# Patient Record
Sex: Female | Born: 1950
Health system: Southern US, Community
[De-identification: ages and names within clinical notes are randomized; demographics above are authoritative.]

## PROBLEM LIST (undated history)

## (undated) DIAGNOSIS — I8393 Asymptomatic varicose veins of bilateral lower extremities: Secondary | ICD-10-CM

## (undated) DIAGNOSIS — Z803 Family history of malignant neoplasm of breast: Secondary | ICD-10-CM

## (undated) DIAGNOSIS — E785 Hyperlipidemia, unspecified: Secondary | ICD-10-CM

## (undated) DIAGNOSIS — M1990 Unspecified osteoarthritis, unspecified site: Secondary | ICD-10-CM

## (undated) DIAGNOSIS — N200 Calculus of kidney: Secondary | ICD-10-CM

## (undated) DIAGNOSIS — H04123 Dry eye syndrome of bilateral lacrimal glands: Secondary | ICD-10-CM

## (undated) DIAGNOSIS — Z8042 Family history of malignant neoplasm of prostate: Secondary | ICD-10-CM

## (undated) DIAGNOSIS — Z9889 Other specified postprocedural states: Secondary | ICD-10-CM

## (undated) DIAGNOSIS — R112 Nausea with vomiting, unspecified: Secondary | ICD-10-CM

## (undated) DIAGNOSIS — M8580 Other specified disorders of bone density and structure, unspecified site: Secondary | ICD-10-CM

## (undated) DIAGNOSIS — Z87442 Personal history of urinary calculi: Secondary | ICD-10-CM

## (undated) DIAGNOSIS — IMO0002 Reserved for concepts with insufficient information to code with codable children: Secondary | ICD-10-CM

## (undated) DIAGNOSIS — N390 Urinary tract infection, site not specified: Secondary | ICD-10-CM

## (undated) DIAGNOSIS — C4360 Malignant melanoma of unspecified upper limb, including shoulder: Secondary | ICD-10-CM

## (undated) DIAGNOSIS — E569 Vitamin deficiency, unspecified: Secondary | ICD-10-CM

## (undated) DIAGNOSIS — F419 Anxiety disorder, unspecified: Secondary | ICD-10-CM

## (undated) DIAGNOSIS — I10 Essential (primary) hypertension: Secondary | ICD-10-CM

## (undated) DIAGNOSIS — D649 Anemia, unspecified: Secondary | ICD-10-CM

## (undated) DIAGNOSIS — D219 Benign neoplasm of connective and other soft tissue, unspecified: Secondary | ICD-10-CM

## (undated) DIAGNOSIS — Z8049 Family history of malignant neoplasm of other genital organs: Secondary | ICD-10-CM

## (undated) DIAGNOSIS — Z800 Family history of malignant neoplasm of digestive organs: Secondary | ICD-10-CM

## (undated) DIAGNOSIS — E66811 Obesity, class 1: Secondary | ICD-10-CM

## (undated) DIAGNOSIS — C50912 Malignant neoplasm of unspecified site of left female breast: Secondary | ICD-10-CM

## (undated) DIAGNOSIS — E669 Obesity, unspecified: Secondary | ICD-10-CM

## (undated) HISTORY — PX: CHOLECYSTECTOMY: SHX55

## (undated) HISTORY — DX: Family history of malignant neoplasm of digestive organs: Z80.0

## (undated) HISTORY — DX: Vitamin deficiency, unspecified: E56.9

## (undated) HISTORY — PX: SKIN CANCER EXCISION: SHX779

## (undated) HISTORY — PX: HYSTEROSCOPY: SHX211

## (undated) HISTORY — PX: SKIN BIOPSY: SHX1

## (undated) HISTORY — DX: Malignant melanoma of unspecified upper limb, including shoulder: C43.60

## (undated) HISTORY — DX: Family history of malignant neoplasm of prostate: Z80.42

## (undated) HISTORY — DX: Family history of malignant neoplasm of other genital organs: Z80.49

## (undated) HISTORY — PX: BREAST SURGERY: SHX581

## (undated) HISTORY — DX: Unspecified osteoarthritis, unspecified site: M19.90

## (undated) HISTORY — DX: Hyperlipidemia, unspecified: E78.5

## (undated) HISTORY — PX: DERMOID CYST  EXCISION: SHX1452

## (undated) HISTORY — DX: Other specified disorders of bone density and structure, unspecified site: M85.80

## (undated) HISTORY — DX: Calculus of kidney: N20.0

## (undated) HISTORY — DX: Benign neoplasm of connective and other soft tissue, unspecified: D21.9

## (undated) HISTORY — DX: Essential (primary) hypertension: I10

## (undated) HISTORY — PX: APPENDECTOMY: SHX54

## (undated) HISTORY — DX: Family history of malignant neoplasm of breast: Z80.3

## (undated) HISTORY — DX: Urinary tract infection, site not specified: N39.0

## (undated) HISTORY — PX: GALLBLADDER SURGERY: SHX652

---

## 1973-02-28 HISTORY — PX: ABDOMINAL SURGERY: SHX537

## 1996-02-29 HISTORY — PX: DILATION AND CURETTAGE OF UTERUS: SHX78

## 1998-07-10 ENCOUNTER — Other Ambulatory Visit: Admission: RE | Admit: 1998-07-10 | Discharge: 1998-07-10 | Payer: Self-pay | Admitting: Gynecology

## 1999-07-12 ENCOUNTER — Other Ambulatory Visit: Admission: RE | Admit: 1999-07-12 | Discharge: 1999-07-12 | Payer: Self-pay | Admitting: Gynecology

## 1999-08-18 ENCOUNTER — Encounter: Admission: RE | Admit: 1999-08-18 | Discharge: 1999-08-18 | Payer: Self-pay | Admitting: Surgery

## 1999-08-18 ENCOUNTER — Encounter: Payer: Self-pay | Admitting: Surgery

## 1999-08-24 ENCOUNTER — Other Ambulatory Visit: Admission: RE | Admit: 1999-08-24 | Discharge: 1999-08-24 | Payer: Self-pay | Admitting: Surgery

## 1999-08-24 ENCOUNTER — Encounter (INDEPENDENT_AMBULATORY_CARE_PROVIDER_SITE_OTHER): Payer: Self-pay

## 2000-07-12 ENCOUNTER — Other Ambulatory Visit: Admission: RE | Admit: 2000-07-12 | Discharge: 2000-07-12 | Payer: Self-pay | Admitting: Gynecology

## 2001-08-07 ENCOUNTER — Other Ambulatory Visit: Admission: RE | Admit: 2001-08-07 | Discharge: 2001-08-07 | Payer: Self-pay | Admitting: Gynecology

## 2002-08-19 ENCOUNTER — Other Ambulatory Visit: Admission: RE | Admit: 2002-08-19 | Discharge: 2002-08-19 | Payer: Self-pay | Admitting: Gynecology

## 2003-08-22 ENCOUNTER — Other Ambulatory Visit: Admission: RE | Admit: 2003-08-22 | Discharge: 2003-08-22 | Payer: Self-pay | Admitting: Gynecology

## 2004-08-26 ENCOUNTER — Other Ambulatory Visit: Admission: RE | Admit: 2004-08-26 | Discharge: 2004-08-26 | Payer: Self-pay | Admitting: Gynecology

## 2007-03-01 DIAGNOSIS — C4360 Malignant melanoma of unspecified upper limb, including shoulder: Secondary | ICD-10-CM

## 2007-03-01 HISTORY — DX: Malignant melanoma of unspecified upper limb, including shoulder: C43.60

## 2007-06-21 ENCOUNTER — Other Ambulatory Visit: Admission: RE | Admit: 2007-06-21 | Discharge: 2007-06-21 | Payer: Self-pay | Admitting: Gynecology

## 2008-07-29 DIAGNOSIS — E569 Vitamin deficiency, unspecified: Secondary | ICD-10-CM

## 2008-07-29 HISTORY — DX: Vitamin deficiency, unspecified: E56.9

## 2008-08-14 ENCOUNTER — Ambulatory Visit: Payer: Self-pay | Admitting: Gynecology

## 2008-08-14 ENCOUNTER — Encounter: Payer: Self-pay | Admitting: Gynecology

## 2008-08-14 ENCOUNTER — Other Ambulatory Visit: Admission: RE | Admit: 2008-08-14 | Discharge: 2008-08-14 | Payer: Self-pay | Admitting: Gynecology

## 2008-08-27 ENCOUNTER — Ambulatory Visit: Payer: Self-pay | Admitting: Gynecology

## 2008-09-03 ENCOUNTER — Ambulatory Visit: Payer: Self-pay | Admitting: Gynecology

## 2009-07-17 ENCOUNTER — Ambulatory Visit: Payer: Self-pay | Admitting: Gynecology

## 2009-08-18 ENCOUNTER — Other Ambulatory Visit: Admission: RE | Admit: 2009-08-18 | Discharge: 2009-08-18 | Payer: Self-pay | Admitting: Gynecology

## 2009-08-18 ENCOUNTER — Ambulatory Visit: Payer: Self-pay | Admitting: Gynecology

## 2009-09-22 ENCOUNTER — Ambulatory Visit: Payer: Self-pay | Admitting: Gynecology

## 2010-05-21 ENCOUNTER — Other Ambulatory Visit: Payer: Self-pay | Admitting: Dermatology

## 2010-08-20 ENCOUNTER — Other Ambulatory Visit (HOSPITAL_COMMUNITY)
Admission: RE | Admit: 2010-08-20 | Discharge: 2010-08-20 | Disposition: A | Discharge status: Home / Self Care | Payer: BC Managed Care – PPO | Source: Ambulatory Visit | Attending: Gynecology | Admitting: Gynecology

## 2010-08-20 ENCOUNTER — Encounter (INDEPENDENT_AMBULATORY_CARE_PROVIDER_SITE_OTHER): Payer: BC Managed Care – PPO | Admitting: Gynecology

## 2010-08-20 ENCOUNTER — Other Ambulatory Visit: Payer: Self-pay | Admitting: Gynecology

## 2010-08-20 DIAGNOSIS — R82998 Other abnormal findings in urine: Secondary | ICD-10-CM

## 2010-08-20 DIAGNOSIS — N952 Postmenopausal atrophic vaginitis: Secondary | ICD-10-CM

## 2010-08-20 DIAGNOSIS — Z833 Family history of diabetes mellitus: Secondary | ICD-10-CM

## 2010-08-20 DIAGNOSIS — Z124 Encounter for screening for malignant neoplasm of cervix: Secondary | ICD-10-CM | POA: Insufficient documentation

## 2010-08-20 DIAGNOSIS — M899 Disorder of bone, unspecified: Secondary | ICD-10-CM

## 2010-08-20 DIAGNOSIS — Z13220 Encounter for screening for lipoid disorders: Secondary | ICD-10-CM

## 2010-08-20 DIAGNOSIS — Z01419 Encounter for gynecological examination (general) (routine) without abnormal findings: Secondary | ICD-10-CM

## 2010-11-26 ENCOUNTER — Other Ambulatory Visit: Payer: Self-pay | Admitting: Dermatology

## 2010-12-17 ENCOUNTER — Ambulatory Visit (INDEPENDENT_AMBULATORY_CARE_PROVIDER_SITE_OTHER): Payer: BC Managed Care – PPO | Admitting: Women's Health

## 2010-12-17 ENCOUNTER — Encounter: Payer: Self-pay | Admitting: Women's Health

## 2010-12-17 VITALS — BP 150/84 | Temp 97.4°F

## 2010-12-17 DIAGNOSIS — B373 Candidiasis of vulva and vagina: Secondary | ICD-10-CM

## 2010-12-17 DIAGNOSIS — N949 Unspecified condition associated with female genital organs and menstrual cycle: Secondary | ICD-10-CM

## 2010-12-17 DIAGNOSIS — B3731 Acute candidiasis of vulva and vagina: Secondary | ICD-10-CM

## 2010-12-17 DIAGNOSIS — N9489 Other specified conditions associated with female genital organs and menstrual cycle: Secondary | ICD-10-CM

## 2010-12-17 DIAGNOSIS — N390 Urinary tract infection, site not specified: Secondary | ICD-10-CM

## 2010-12-17 DIAGNOSIS — R82998 Other abnormal findings in urine: Secondary | ICD-10-CM

## 2010-12-17 MED ORDER — SULFAMETHOXAZOLE-TRIMETHOPRIM 800-160 MG PO TABS: 1.0000 | ORAL_TABLET | Freq: Two times a day (BID) | ORAL | Status: AC

## 2010-12-17 MED ORDER — FLUCONAZOLE 150 MG PO TABS: 150.0000 mg | ORAL_TABLET | Freq: Once | ORAL | Status: AC

## 2010-12-17 NOTE — Progress Notes (Signed)
  Presents with a complaint of pain, burning, increased frequency and urgency with urination and a slight discharge. Postmenopausal with no bleeding and not sexually active.  Exam: No CVAT, external genitalia is slightly erythematous, speculum exam scant discharge.  Wet prep is positive for yeast. UA 15-20 WBCs +2 bacteria.  Plan: Septra DS one by mouth twice a day for 3 days prescription proper use was given and reviewed, Diflucan 150 by mouth x1 dose with a refill. Will check a urine culture. UTI prevention discussed, will call if no relief.

## 2011-04-03 ENCOUNTER — Emergency Department (HOSPITAL_COMMUNITY)
Admission: EM | Admit: 2011-04-03 | Discharge: 2011-04-03 | Disposition: A | Discharge status: Home / Self Care | Payer: BC Managed Care – PPO | Attending: Emergency Medicine | Admitting: Emergency Medicine

## 2011-04-03 ENCOUNTER — Encounter (HOSPITAL_COMMUNITY): Payer: Self-pay | Admitting: *Deleted

## 2011-04-03 DIAGNOSIS — R300 Dysuria: Secondary | ICD-10-CM | POA: Insufficient documentation

## 2011-04-03 DIAGNOSIS — N390 Urinary tract infection, site not specified: Secondary | ICD-10-CM | POA: Insufficient documentation

## 2011-04-03 DIAGNOSIS — R319 Hematuria, unspecified: Secondary | ICD-10-CM | POA: Insufficient documentation

## 2011-04-03 HISTORY — DX: Dry eye syndrome of bilateral lacrimal glands: H04.123

## 2011-04-03 LAB — URINE MICROSCOPIC-ADD ON

## 2011-04-03 LAB — URINALYSIS, ROUTINE W REFLEX MICROSCOPIC
Nitrite: POSITIVE — AB
Protein, ur: 100 mg/dL — AB
Specific Gravity, Urine: 1.027 (ref 1.005–1.030)
Urobilinogen, UA: 0.2 mg/dL (ref 0.0–1.0)

## 2011-04-03 MED ORDER — CIPROFLOXACIN HCL 500 MG PO TABS: 500.0000 mg | ORAL_TABLET | Freq: Two times a day (BID) | ORAL | Status: AC

## 2011-04-03 MED ORDER — PHENAZOPYRIDINE HCL 200 MG PO TABS: 200.0000 mg | ORAL_TABLET | Freq: Three times a day (TID) | ORAL | Status: AC

## 2011-04-03 MED ORDER — PHENAZOPYRIDINE HCL 200 MG PO TABS
200.0000 mg | ORAL_TABLET | Freq: Once | ORAL | Status: AC
  Administered 2011-04-03: 200 mg via ORAL
  Filled 2011-04-03: qty 1

## 2011-04-03 MED ORDER — CIPROFLOXACIN HCL 500 MG PO TABS
500.0000 mg | ORAL_TABLET | Freq: Once | ORAL | Status: AC
  Administered 2011-04-03: 500 mg via ORAL
  Filled 2011-04-03: qty 1

## 2011-04-03 NOTE — ED Provider Notes (Signed)
History     CSN: [id number]  Arrival date & time 04/03/11  0700   First MD Initiated Contact with Patient 04/03/11 0710      Chief Complaint  Patient presents with  . Hematuria  . Dysuria    (Consider location/radiation/quality/duration/timing/severity/associated sxs/prior treatment) Patient is a 61 y.o. female presenting with hematuria and dysuria. The history is provided by the patient.  Hematuria Associated symptoms include dysuria. Pertinent negatives include no chills, fever, nausea or vomiting.  Dysuria  Associated symptoms include hematuria. Pertinent negatives include no chills, no nausea and no vomiting.  pt c/o dysuria for past day and hematuria this am. Mild suprapubic discomfort, dull, non radiating. No back or flank pain. No nv. No fever or chills. States had uncomplicated uti 10/12. No nv. Normal appetite. No vaginal bleeding. No other abn bruising or bleeding. No anticoag use.   Past Medical History  Diagnosis Date  . Melanoma of upper arm 2009    RIGHT ARM   . Osteopenia 07/2000    DEXA  . Vitamin deficiency 07/2008    VITAMIN D LOW 23  . Vitamin d deficiency 07/2009    LOW VITAMIN D 29  . Bilateral dry eyes   . Cancer     Past Surgical History  Procedure Date  . Dermoid cyst  excision   . Hysteroscopy   . Cholpys   . Cholecysct   . Cholecystectomy   . Skin cancer excision 2010,2011    2010-RIGHT ARM, 2011-LEFT LOWER LEG.-DR Venancio Poisson DERM. DR. Irene Limbo IS HER DERM SURGEON.    Family History  Problem Relation Age of Onset  . Diabetes Father   . Hypertension Father   . Heart disease Father   . Prostate cancer Father   . Breast cancer Sister   . Skin cancer Sister   . Colon cancer Paternal Grandfather     History  Substance Use Topics  . Smoking status: Never Smoker   . Smokeless tobacco: Never Used  . Alcohol Use: No    OB History    Grav Para Term Preterm Abortions TAB SAB Ect Mult Living                  Review of Systems    Constitutional: Negative for fever and chills.  Gastrointestinal: Negative for nausea, vomiting and diarrhea.  Genitourinary: Positive for dysuria and hematuria.  Musculoskeletal: Negative for back pain.    Allergies  Neosporin wound cleanser  Home Medications  No current outpatient prescriptions on file.  BP 156/86  Pulse 93  Temp(Src) 98 F (36.7 C) (Oral)  Resp 18  Ht 5\' 9"  (1.753 m)  Wt 230 lb (104.327 kg)  BMI 33.96 kg/m2  SpO2 100%  LMP 03/18/1997  Physical Exam  Nursing note and vitals reviewed. Constitutional: She appears well-developed and well-nourished. No distress.  Eyes: Conjunctivae are normal. No scleral icterus.  Neck: Neck supple. No tracheal deviation present.  Cardiovascular: Normal rate.   Pulmonary/Chest: Effort normal. No respiratory distress.  Abdominal: Soft. Normal appearance and bowel sounds are normal. She exhibits no distension and no mass. There is no tenderness.  Genitourinary:       No cva tenderness  Musculoskeletal: She exhibits no edema.  Neurological: She is alert.  Skin: Skin is warm and dry. No rash noted.  Psychiatric: She has a normal mood and affect.    ED Course  Procedures (including critical care time)   Labs Reviewed  URINALYSIS, ROUTINE W REFLEX MICROSCOPIC  Results for orders placed during the hospital encounter of 04/03/11  URINALYSIS, ROUTINE W REFLEX MICROSCOPIC      Component Value Range   Color, Urine BROWN (*) YELLOW    APPearance TURBID (*) CLEAR    Specific Gravity, Urine 1.027  1.005 - 1.030    pH 5.5  5.0 - 8.0    Glucose, UA NEGATIVE  NEGATIVE (mg/dL)   Hgb urine dipstick LARGE (*) NEGATIVE    Bilirubin Urine SMALL (*) NEGATIVE    Ketones, ur TRACE (*) NEGATIVE (mg/dL)   Protein, ur 409 (*) NEGATIVE (mg/dL)   Urobilinogen, UA 0.2  0.0 - 1.0 (mg/dL)   Nitrite POSITIVE (*) NEGATIVE    Leukocytes, UA LARGE (*) NEGATIVE   URINE MICROSCOPIC-ADD ON      Component Value Range   Squamous Epithelial /  LPF RARE  RARE    WBC, UA TOO NUMEROUS TO COUNT  <3 (WBC/hpf)   RBC / HPF TOO NUMEROUS TO COUNT  <3 (RBC/hpf)   Bacteria, UA FEW (*) RARE    Urine-Other MICROSCOPIC EXAM PERFORMED ON UNCONCENTRATED URINE        MDM  ua sent.  ua pos.  Confirmed nkda (x neosporin). cipro and pyridium.         Suzi Roots, MD 04/03/11 [phone]

## 2011-04-03 NOTE — ED Notes (Signed)
Pt from home c/o hematuria as well as pressure in the urethral area and left groin area that started yesterday.

## 2011-04-03 NOTE — ED Notes (Signed)
Pt c/o hematuria starting at 0300. Pt c/o dysuria since yesterday.

## 2011-04-03 NOTE — ED Notes (Signed)
Pt given discharge instructions, amb with steady gait to discharge window 

## 2011-04-03 NOTE — ED Notes (Signed)
MD at bedside. 

## 2011-08-22 ENCOUNTER — Ambulatory Visit (INDEPENDENT_AMBULATORY_CARE_PROVIDER_SITE_OTHER): Payer: BC Managed Care – PPO | Admitting: Gynecology

## 2011-08-22 ENCOUNTER — Encounter: Payer: Self-pay | Admitting: Gynecology

## 2011-08-22 VITALS — BP 134/84 | Ht 68.5 in | Wt 240.0 lb

## 2011-08-22 DIAGNOSIS — Z01419 Encounter for gynecological examination (general) (routine) without abnormal findings: Secondary | ICD-10-CM

## 2011-08-22 DIAGNOSIS — Z131 Encounter for screening for diabetes mellitus: Secondary | ICD-10-CM

## 2011-08-22 DIAGNOSIS — Z13220 Encounter for screening for lipoid disorders: Secondary | ICD-10-CM

## 2011-08-22 DIAGNOSIS — D219 Benign neoplasm of connective and other soft tissue, unspecified: Secondary | ICD-10-CM | POA: Insufficient documentation

## 2011-08-22 DIAGNOSIS — M8580 Other specified disorders of bone density and structure, unspecified site: Secondary | ICD-10-CM

## 2011-08-22 DIAGNOSIS — M899 Disorder of bone, unspecified: Secondary | ICD-10-CM

## 2011-08-22 DIAGNOSIS — E559 Vitamin D deficiency, unspecified: Secondary | ICD-10-CM

## 2011-08-22 LAB — CBC WITH DIFFERENTIAL/PLATELET
Basophils Relative: 0 % (ref 0–1)
Eosinophils Absolute: 0.1 10*3/uL (ref 0.0–0.7)
Hemoglobin: 14.5 g/dL (ref 12.0–15.0)
MCH: 29 pg (ref 26.0–34.0)
MCHC: 34.3 g/dL (ref 30.0–36.0)
Monocytes Absolute: 0.6 10*3/uL (ref 0.1–1.0)
Monocytes Relative: 8 % (ref 3–12)
Neutrophils Relative %: 69 % (ref 43–77)

## 2011-08-22 LAB — LIPID PANEL
Total CHOL/HDL Ratio: 4.5 Ratio
VLDL: 43 mg/dL — ABNORMAL HIGH (ref 0–40)

## 2011-08-22 LAB — GLUCOSE, RANDOM: Glucose, Bld: 102 mg/dL — ABNORMAL HIGH (ref 70–99)

## 2011-08-22 LAB — TSH: TSH: 2.159 u[IU]/mL (ref 0.350–4.500)

## 2011-08-22 NOTE — Progress Notes (Signed)
Brooke [name] 09-30-1950 [id number]        61 y.o.  for annual exam.  Several issues noted below.  Past medical history,surgical history, medications, allergies, family history and social history were all reviewed and documented in the EPIC chart. ROS:  Was performed and pertinent positives and negatives are included in the history.  Exam: 1. [name] Asst. present Filed Vitals:   08/22/11 0938  BP: 134/84   General appearance  Normal Skin grossly normal Head/Neck normal with no cervical or supraclavicular adenopathy thyroid normal Lungs  clear Cardiac RR, without RMG Abdominal  soft, nontender, without masses, organomegaly or hernia Breasts  examined lying and sitting without masses, retractions, discharge or axillary adenopathy. Pelvic  Ext/BUS/vagina  normal   Cervix  normal   Uterus  anteverted, normal size, shape and contour, midline and mobile nontender   Adnexa  Without masses or tenderness    Anus and perineum  normal   Rectovaginal  normal sphincter tone without palpated masses or tenderness.    Assessment/Plan:  61 y.o. female for annual exam.     1. Mild menopausal symptoms with hot flashes/night sweats.  No bleeding history. She had been on HRT in the past. Options to reinitiate HRT versus trial of OTC soy reviewed. I again discussed the risks of HRT to include the WHI study of stroke, heart attack, DVT as well as increased risk of breast cancer. Patient wants to try OTC soy. She'll call me if she is not satisfied with this was progressed to HRT. The issues of transdermal/first pass effect also reviewed. 2. Mammography. Patient has not had a mammogram in several years. I discussed the incidence of breast cancer and the benefit of early detection. I strongly urged her to schedule this. SBE monthly reviewed. 3. Pap smear. Last Pap smear 2012. No Pap smear done today. She has numerous normal records in her chart and no history of abnormal Pap smears. We'll plan less frequent  screening. 4. Colonoscopy. Patients never had a colonoscopy. I reviewed the advantages of early detection and current screening guidelines and strongly recommended her to schedule this. 5. Osteopenia. Patient has mild osteopenia and a T score of -1.1. We'll schedule DEXA later this summer at a two-year interval. Is noted to be low on vitamin D we'll recheck vitamin D level today. Increase calcium vitamin D reviewed. 6. Health maintenance. She's never followed up with a primary physician as I had recommended as far as her cholesterol in the past. I again discussed the needs to address hypercholesterolemia and long-term consequences of cardiovascular disease. We'll recheck her lipid profile today. Otherwise I did recommend establishing care with a primary physician she acknowledges my recommendations.   Dara Lords MD, 10:18 AM 08/22/2011

## 2011-08-22 NOTE — Patient Instructions (Addendum)
Schedule mammography. Schedule colonoscopy. Follow up for bone density study as scheduled. Make appointment to see a primary physician in particular follow up of your cholesterol. Try over-the-counter soy. If you want to further discuss hormone replacement therapy call me. Otherwise follow up in one year for annual exam.

## 2011-08-23 ENCOUNTER — Telehealth: Payer: Self-pay | Admitting: Gynecology

## 2011-08-23 LAB — URINALYSIS W MICROSCOPIC + REFLEX CULTURE
Hgb urine dipstick: NEGATIVE
Nitrite: NEGATIVE
Protein, ur: 30 mg/dL — AB

## 2011-08-23 LAB — VITAMIN D 25 HYDROXY (VIT D DEFICIENCY, FRACTURES): Vit D, 25-Hydroxy: 24 ng/mL — ABNORMAL LOW (ref 30–89)

## 2011-08-23 NOTE — Telephone Encounter (Signed)
Tell patient  #1 glucose very mildly elevated at 102 which is considered prediabetic. #2 cholesterol 241 LDL 144 triglycerides 216 which are all elevated I do think that she needs to establish care with a primary physician to follow and consider treatment. She has run elevated in the past and has not followed up and this is really important to do so as I discussed with her at the office visit. #3 vitamin D.  Mildly low at 24 and I think she needs to start on a vitamin D supplement of 1000 units OTC daily.

## 2011-08-24 LAB — URINE CULTURE: Colony Count: NO GROWTH

## 2011-08-24 NOTE — Telephone Encounter (Signed)
Lm for pt to call

## 2011-09-07 NOTE — Telephone Encounter (Signed)
Left message for pt to call.

## 2011-09-08 NOTE — Telephone Encounter (Signed)
Pt informed with the below note. 

## 2011-10-06 ENCOUNTER — Ambulatory Visit (INDEPENDENT_AMBULATORY_CARE_PROVIDER_SITE_OTHER): Payer: BC Managed Care – PPO

## 2011-10-06 ENCOUNTER — Encounter: Payer: Self-pay | Admitting: Gynecology

## 2011-10-06 DIAGNOSIS — M949 Disorder of cartilage, unspecified: Secondary | ICD-10-CM

## 2011-10-06 DIAGNOSIS — M899 Disorder of bone, unspecified: Secondary | ICD-10-CM

## 2011-10-06 DIAGNOSIS — M8580 Other specified disorders of bone density and structure, unspecified site: Secondary | ICD-10-CM

## 2011-11-14 ENCOUNTER — Ambulatory Visit: Payer: BC Managed Care – PPO | Admitting: Family Medicine

## 2011-11-22 ENCOUNTER — Ambulatory Visit: Payer: BC Managed Care – PPO | Admitting: Family Medicine

## 2011-11-24 ENCOUNTER — Encounter: Payer: Self-pay | Admitting: Family Medicine

## 2011-11-24 ENCOUNTER — Ambulatory Visit (INDEPENDENT_AMBULATORY_CARE_PROVIDER_SITE_OTHER): Payer: BC Managed Care – PPO | Admitting: Family Medicine

## 2011-11-24 VITALS — BP 140/90 | Temp 98.6°F | Ht 69.0 in | Wt 239.0 lb

## 2011-11-24 DIAGNOSIS — E669 Obesity, unspecified: Secondary | ICD-10-CM

## 2011-11-24 DIAGNOSIS — M79673 Pain in unspecified foot: Secondary | ICD-10-CM

## 2011-11-24 DIAGNOSIS — Z23 Encounter for immunization: Secondary | ICD-10-CM

## 2011-11-24 DIAGNOSIS — Z0000 Encounter for general adult medical examination without abnormal findings: Secondary | ICD-10-CM

## 2011-11-24 DIAGNOSIS — E785 Hyperlipidemia, unspecified: Secondary | ICD-10-CM

## 2011-11-24 DIAGNOSIS — M79609 Pain in unspecified limb: Secondary | ICD-10-CM

## 2011-11-24 LAB — BASIC METABOLIC PANEL
BUN: 14 mg/dL (ref 6–23)
CO2: 28 mEq/L (ref 19–32)
Chloride: 105 mEq/L (ref 96–112)
Creatinine, Ser: 0.8 mg/dL (ref 0.4–1.2)

## 2011-11-24 LAB — LDL CHOLESTEROL, DIRECT: Direct LDL: 132.7 mg/dL

## 2011-11-24 LAB — LIPID PANEL
Cholesterol: 212 mg/dL — ABNORMAL HIGH (ref 0–200)
Triglycerides: 111 mg/dL (ref 0.0–149.0)

## 2011-11-24 LAB — HEMOGLOBIN A1C: Hgb A1c MFr Bld: 5.8 % (ref 4.6–6.5)

## 2011-11-24 NOTE — Patient Instructions (Signed)
-  We have ordered labs or studies at this visit. It usually takes 1-2 weeks for results and processing. We will contact you with instructions IF your results are abnormal. Normal results will be released to your [facility] in 1-2 weeks. If you have not heard from [place] or can not find your results in [facility] in 2 weeks please contact our office.  -We placed a referral for you as discussed. It usually takes about 1-2 weeks to process and schedule this referral. If you have not heard from [place] regarding this appointment in 2 weeks please contact our office.  -follow up yearly or sooner if we discover abnormal labs or if concerns

## 2011-11-24 NOTE — Progress Notes (Addendum)
Chief Complaint  Patient presents with  . Establish Care    HPI:  Here to establish care and wants basic screening labs. No other particular concerns today.  Did just finish course of prednisone yesterday.  Does not exercise, has foot issues and bunions - wants referral to podiatry.  Sees Dr. Colin Broach in gyn for yearly well women exams.  Followed by opthomology.  Followed closely by Dr. Venancio Poisson in dermatology for a hx of early melanoma.  Diet not great.  ROS: See pertinent positives and negatives per HPI.  Past Medical History  Diagnosis Date  . Melanoma of upper arm 2009    RIGHT ARM   . Osteopenia 09/2011    T score -1.5 FRAX  1% / 0.6%  . Vitamin deficiency 07/2008    VITAMIN D LOW 23  . Vitamin d deficiency 07/2009    LOW VITAMIN D 29  . Bilateral dry eyes   . Cancer   . Fibroid   . Arthritis   . UTI (urinary tract infection)   . Hyperlipidemia   . Kidney stones     Family History  Problem Relation Age of Onset  . Diabetes Father   . Hypertension Father   . Heart disease Father   . Prostate cancer Father   . Breast cancer Sister     Age 62's  . Skin cancer Sister   . Colon cancer Paternal Grandfather   . Hypertension Mother     History   Social History  . Marital Status: Single    Spouse Name: N/A    Number of Children: N/A  . Years of Education: N/A   Social History Main Topics  . Smoking status: Never Smoker   . Smokeless tobacco: Never Used  . Alcohol Use: Yes     rare  . Drug Use: No  . Sexually Active: No   Other Topics Concern  . None   Social History Narrative  . None    Current outpatient prescriptions:CycloSPORINE (RESTASIS OP), Apply to eye., Disp: , Rfl:   EXAM:  Filed Vitals:   11/24/11 0825  BP: 140/90  Temp: 98.6 F (37 C)    Body mass index is 35.29 kg/(m^2).  GENERAL: vitals reviewed and listed below, alert, oriented, appears well hydrated and in no acute distress  HEENT: atraumatic,  conjucntiva clear, no obvious abnormalities on inspection of external nose and ears  NECK: no masses on inspection  LUNGS: clear to auscultation bilaterally, no wheezes, rales or rhonchi, good air movement  CV: HRRR, no peripheral edema  MS: moves all extremities without noticeable abnormality  PSYCH: pleasant and cooperative, no obvious depression or anxiety  ASSESSMENT AND PLAN:  Discussed the following assessment and plan:  1. Visit for preventive health examination  Basic Metabolic Panel, Tdap vaccine greater than or equal to 7yo IM, Ambulatory referral to Gastroenterology, Hep C Antibody  2. Hyperlipidemia  Lipid Panel  3. Obesity  Hemoglobin A1c  4. Foot pain  Ambulatory referral to Podiatry   -preventive care reviewed and advised per orders - she will get flu at work -diet and exercise discussed at length -pt will schedule mammogram  Orders Placed This Encounter  Procedures  . Tdap vaccine greater than or equal to 7yo IM  . Lipid Panel  . Hemoglobin A1c  . Basic Metabolic Panel  . Hep C Antibody  . Ambulatory referral to Gastroenterology    Referral Priority:  Routine    Referral Type:  Consultation  Referral Reason:  Specialty Services Required    Requested Specialty:  Gastroenterology    Number of Visits Requested:  1  . Ambulatory referral to Podiatry    Referral Priority:  Routine    Referral Type:  Consultation    Referral Reason:  Specialty Services Required    Requested Specialty:  Podiatry    Number of Visits Requested:  1    Patient Instructions  -We have ordered labs or studies at this visit. It usually takes 1-2 weeks for results and processing. We will contact you with instructions IF your results are abnormal. Normal results will be released to your [facility] in 1-2 weeks. If you have not heard from [place] or can not find your results in [facility] in 2 weeks please contact our office.  -We placed a referral for you as discussed. It usually takes about  1-2 weeks to process and schedule this referral. If you have not heard from [place] regarding this appointment in 2 weeks please contact our office.  -follow up yearly or sooner if we discover abnormal labs or if concerns         Return to clinic immediately if symptoms worsen or persist or new concerns.  Return if symptoms worsen or fail to improve.  Kriste Basque R.

## 2011-11-24 NOTE — Addendum Note (Signed)
Addended by: Terressa Koyanagi on: 11/24/2011 12:36 PM   Modules accepted: Level of Service

## 2011-11-25 ENCOUNTER — Telehealth: Payer: Self-pay | Admitting: Family Medicine

## 2011-11-25 NOTE — Telephone Encounter (Signed)
Left a message for return call.  

## 2011-11-25 NOTE — Telephone Encounter (Signed)
Brooke [name],  Let Brooke [name] know:  Most of her labs looked good.   Her cholesterol and diabetes screening test indicate she may be at risk for developing diabetes and/or problems with her cholesterol. No medications are needed now, but we do recommend she do everything she can to eat healthy and stay active.  I would like for her to follow up in 4-5 months.   Thanks.

## 2011-11-28 NOTE — Telephone Encounter (Signed)
Left a message for pt to return call 

## 2011-11-30 NOTE — Telephone Encounter (Signed)
Attempted to call pt a few times.  A letter has been sent to pt's home address concerning labs.

## 2011-12-19 ENCOUNTER — Encounter: Payer: Self-pay | Admitting: Family Medicine

## 2011-12-19 DIAGNOSIS — E559 Vitamin D deficiency, unspecified: Secondary | ICD-10-CM | POA: Insufficient documentation

## 2011-12-19 DIAGNOSIS — M8580 Other specified disorders of bone density and structure, unspecified site: Secondary | ICD-10-CM

## 2011-12-19 DIAGNOSIS — E785 Hyperlipidemia, unspecified: Secondary | ICD-10-CM | POA: Insufficient documentation

## 2011-12-19 DIAGNOSIS — N951 Menopausal and female climacteric states: Secondary | ICD-10-CM

## 2011-12-19 NOTE — Progress Notes (Signed)
Some records received from Colin Broach, MD in gyn. Briefly reviewed. CPE 07/2011 Updated PMH, Problem list, health maintenance.

## 2012-01-19 ENCOUNTER — Other Ambulatory Visit: Payer: Self-pay | Admitting: Dermatology

## 2012-02-23 ENCOUNTER — Other Ambulatory Visit: Payer: Self-pay | Admitting: Dermatology

## 2012-07-25 ENCOUNTER — Other Ambulatory Visit: Payer: Self-pay | Admitting: Dermatology

## 2012-10-05 ENCOUNTER — Encounter: Payer: Self-pay | Admitting: Family Medicine

## 2012-10-05 ENCOUNTER — Ambulatory Visit (INDEPENDENT_AMBULATORY_CARE_PROVIDER_SITE_OTHER): Payer: BC Managed Care – PPO | Admitting: Family Medicine

## 2012-10-05 VITALS — BP 128/82 | Temp 98.9°F | Wt 244.0 lb

## 2012-10-05 DIAGNOSIS — R300 Dysuria: Secondary | ICD-10-CM

## 2012-10-05 DIAGNOSIS — R319 Hematuria, unspecified: Secondary | ICD-10-CM

## 2012-10-05 LAB — POCT URINALYSIS DIPSTICK
Bilirubin, UA: NEGATIVE
Glucose, UA: NEGATIVE
Nitrite, UA: NEGATIVE

## 2012-10-05 MED ORDER — CIPROFLOXACIN HCL 500 MG PO TABS: 500.0000 mg | ORAL_TABLET | Freq: Two times a day (BID) | ORAL | Status: DC

## 2012-10-05 NOTE — Progress Notes (Signed)
Chief Complaint  Patient presents with  . Hematuria    HPI:  Brooke [name] is here for an acute visit for Dysuria: -started: yesterday -symptoms: dysuria, burning with urination, frequency, urgency, gross hematuria (pink urine) this morning - now resolved -denies: fever, nausea, vomiting, flank pain -hx of: UTI and kidney stones (very remotely and this does not feel anything like kidney stones), has had several UTIs over the last few years  ROS: See pertinent positives and negatives per HPI.  Past Medical History  Diagnosis Date  . Melanoma of upper arm 2009    RIGHT ARM   . Osteopenia 09/2011    T score -1.5 FRAX  1% / 0.6%  . Vitamin deficiency 07/2008    VITAMIN D LOW 23  . Vitamin D deficiency 07/2009    LOW VITAMIN D 29  . Bilateral dry eyes   . Cancer   . Fibroid   . Arthritis   . UTI (urinary tract infection)   . Hyperlipidemia   . Kidney stones     Family History  Problem Relation Age of Onset  . Diabetes Father   . Hypertension Father   . Heart disease Father   . Prostate cancer Father   . Breast cancer Sister     Age 18's  . Skin cancer Sister   . Colon cancer Paternal Grandfather   . Hypertension Mother     History   Social History  . Marital Status: Single    Spouse Name: N/A    Number of Children: N/A  . Years of Education: N/A   Social History Main Topics  . Smoking status: Never Smoker   . Smokeless tobacco: Never Used  . Alcohol Use: Yes     Comment: rare  . Drug Use: No  . Sexually Active: No   Other Topics Concern  . None   Social History Narrative  . None    Current outpatient prescriptions:CycloSPORINE (RESTASIS OP), Apply to eye., Disp: , Rfl: ;  ciprofloxacin (CIPRO) 500 MG tablet, Take 1 tablet (500 mg total) by mouth 2 (two) times daily., Disp: 10 tablet, Rfl: 0  EXAM:  Filed Vitals:   10/05/12 1309  BP: 128/82  Temp: 98.9 F (37.2 C)    Body mass index is 36.02 kg/(m^2).  GENERAL: vitals reviewed and listed  above, alert, oriented, appears well hydrated and in no acute distress  HEENT: atraumatic, conjunttiva clear, no obvious abnormalities on inspection of external nose and ears  NECK: no obvious masses on inspection  LUNGS: clear to auscultation bilaterally, no wheezes, rales or rhonchi, good air movement  CV: HRRR, no peripheral edema  ABD: soft, NTTP, no CVA TTP  MS: moves all extremities without noticeable abnormality  PSYCH: pleasant and cooperative, no obvious depression or anxiety  ASSESSMENT AND PLAN:  Discussed the following assessment and plan:  Dysuria - Plan: Culture, Urine, ciprofloxacin (CIPRO) 500 MG tablet  Hematuria - Plan: POCT urinalysis dipstick, ciprofloxacin (CIPRO) 500 MG tablet  -signs and symptoms c/w with LUTI - urine dip abn w/ bld and pyuria, culture pending -discussed with pt and will tx empirically while awaiting culture - risks discussed -Patient advised to return or notify a doctor immediately if symptoms worsen or persist or new concerns arise.  Patient Instructions  -As we discussed, we have prescribed a new medication for you at this appointment. We discussed the common and serious potential adverse effects of this medication and you can review these and more with the pharmacist  when you pick up your medication.  Please follow the instructions for use carefully and notify us immediately if you have any problems taking this medication.  -We have ordered labs or studies at this visit. It can take up to 1-2 weeks for results and processing. We will contact you with instructions IF your results are abnormal. Normal results will be released to your [facility]. If you have not heard from [place] or can not find your results in [facility] in 2 weeks please contact our office.  -plenty of fluids and can use Azo for symptoms per instructions if needed     [name], Dahlia Client R.

## 2012-10-05 NOTE — Patient Instructions (Signed)
-  As we discussed, we have prescribed a new medication for you at this appointment. We discussed the common and serious potential adverse effects of this medication and you can review these and more with the pharmacist when you pick up your medication.  Please follow the instructions for use carefully and notify us immediately if you have any problems taking this medication.  -We have ordered labs or studies at this visit. It can take up to 1-2 weeks for results and processing. We will contact you with instructions IF your results are abnormal. Normal results will be released to your [facility]. If you have not heard from [place] or can not find your results in [facility] in 2 weeks please contact our office.  -plenty of fluids and can use Azo for symptoms per instructions if needed

## 2012-10-07 LAB — URINE CULTURE: Colony Count: 9000

## 2012-10-08 NOTE — Progress Notes (Signed)
Quick Note:  Left a message for pt to return call. ______ 

## 2012-10-09 NOTE — Progress Notes (Signed)
Quick Note:  Left a message for pt to return call. ______ 

## 2012-10-10 NOTE — Progress Notes (Signed)
Quick Note:  Left a message for return call. ______ 

## 2012-10-18 NOTE — Progress Notes (Signed)
Quick Note:  Left a message for pt to return call. ______ 

## 2012-11-05 ENCOUNTER — Encounter: Payer: Self-pay | Admitting: Family Medicine

## 2012-11-05 ENCOUNTER — Other Ambulatory Visit: Payer: Self-pay | Admitting: Family Medicine

## 2012-11-05 ENCOUNTER — Ambulatory Visit (INDEPENDENT_AMBULATORY_CARE_PROVIDER_SITE_OTHER): Payer: BC Managed Care – PPO | Admitting: Family Medicine

## 2012-11-05 VITALS — BP 142/82 | Temp 98.4°F | Wt 244.0 lb

## 2012-11-05 DIAGNOSIS — R319 Hematuria, unspecified: Secondary | ICD-10-CM

## 2012-11-05 LAB — URINALYSIS, ROUTINE W REFLEX MICROSCOPIC
Bilirubin Urine: NEGATIVE
Hgb urine dipstick: NEGATIVE
Leukocytes, UA: NEGATIVE
Nitrite: NEGATIVE
Urobilinogen, UA: 0.2 (ref 0.0–1.0)
pH: 7 (ref 5.0–8.0)

## 2012-11-05 NOTE — Progress Notes (Signed)
Chief Complaint  Patient presents with  . follow up UTI    HPI:  Brooke [name] is here for follow up UTI: -tx about 1 month ago for UTI when had symptoms, pyuria and hematuria - urine culture inconclusive, she did not complete the cipro because misplaced last few pills -reports: has felt ok, occ vulvovag pruritis, occ urinary urgency -denies: denies and further gross hematuria, frequency, pain, burning with urination, flank pain   ROS: See pertinent positives and negatives per HPI.  Past Medical History  Diagnosis Date  . Melanoma of upper arm 2009    RIGHT ARM   . Osteopenia 09/2011    T score -1.5 FRAX  1% / 0.6%  . Vitamin deficiency 07/2008    VITAMIN D LOW 23  . Vitamin D deficiency 07/2009    LOW VITAMIN D 29  . Bilateral dry eyes   . Cancer   . Fibroid   . Arthritis   . UTI (urinary tract infection)   . Hyperlipidemia   . Kidney stones     Past Surgical History  Procedure Laterality Date  . Dermoid cyst  excision    . Hysteroscopy    . Cholecystectomy    . Skin cancer excision  2010,2011    2010-RIGHT ARM, 2011-LEFT LOWER LEG.-DR Venancio Poisson DERM. DR. Irene Limbo IS HER DERM SURGEON.  . Skin biopsy    . Dilation and curettage of uterus  1998  . Gallbladder surgery    . Abdominal surgery  1975    ovairan cyst     Family History  Problem Relation Age of Onset  . Diabetes Father   . Hypertension Father   . Heart disease Father   . Prostate cancer Father   . Breast cancer Sister     Age 52's  . Skin cancer Sister   . Colon cancer Paternal Grandfather   . Hypertension Mother     History   Social History  . Marital Status: Single    Spouse Name: N/A    Number of Children: N/A  . Years of Education: N/A   Social History Main Topics  . Smoking status: Never Smoker   . Smokeless tobacco: Never Used  . Alcohol Use: Yes     Comment: rare  . Drug Use: No  . Sexual Activity: No   Other Topics Concern  . None   Social History Narrative  . None     Current outpatient prescriptions:CycloSPORINE (RESTASIS OP), Apply to eye., Disp: , Rfl: ;  ciprofloxacin (CIPRO) 500 MG tablet, Take 1 tablet (500 mg total) by mouth 2 (two) times daily., Disp: 10 tablet, Rfl: 0  EXAM:  Filed Vitals:   11/05/12 1507  BP: 142/82  Temp: 98.4 F (36.9 C)    Body mass index is 36.02 kg/(m^2).  GENERAL: vitals reviewed and listed above, alert, oriented, appears well hydrated and in no acute distress  HEENT: atraumatic, conjunttiva clear, no obvious abnormalities on inspection of external nose and ears  NECK: no obvious masses on inspection  MS: moves all extremities without noticeable abnormality  PSYCH: pleasant and cooperative, no obvious depression or anxiety  ASSESSMENT AND PLAN:  Discussed the following assessment and plan:  Hematuria - Plan: Urinalysis with Reflex Microscopic, Culture, Urine  -will recheck urinalysis and culture per pt concerns -if hematuria without infection discussed options an she would like to see urology and will place referral should labs show this -Patient advised to return or notify a doctor immediately if symptoms  worsen or persist or new concerns arise.  There are no Patient Instructions on file for this visit.   Kriste Basque R.

## 2012-11-08 LAB — URINE CULTURE: Organism ID, Bacteria: NO GROWTH

## 2012-11-13 NOTE — Progress Notes (Signed)
Quick Note:  Left a message for pt to return call. ______ 

## 2012-11-14 NOTE — Progress Notes (Signed)
Quick Note:  Left a message for return call. ______ 

## 2012-11-17 NOTE — Progress Notes (Signed)
Quick Note:  Left a message for return call. ______ 

## 2012-11-19 NOTE — Progress Notes (Signed)
Quick Note:    Letter mailed to patient  ______

## 2012-12-31 ENCOUNTER — Encounter: Payer: Self-pay | Admitting: Podiatry

## 2012-12-31 ENCOUNTER — Ambulatory Visit (INDEPENDENT_AMBULATORY_CARE_PROVIDER_SITE_OTHER): Payer: BC Managed Care – PPO | Admitting: Podiatry

## 2012-12-31 VITALS — BP 103/56 | HR 69 | Resp 12 | Ht 69.0 in | Wt 240.0 lb

## 2012-12-31 DIAGNOSIS — L84 Corns and callosities: Secondary | ICD-10-CM

## 2012-12-31 DIAGNOSIS — M79609 Pain in unspecified limb: Secondary | ICD-10-CM

## 2012-12-31 NOTE — Progress Notes (Signed)
Subjective:     Patient ID: Brooke [name], female   DOB: Aug 15, 1950, 62 y.o.   MRN: [id number]  HPI patient is found to have severe foot structural issues left with severe keratotic lesions plantar that are very painful   Review of Systems     Objective:   Physical Exam  Nursing note and vitals reviewed. Constitutional: She is oriented to person, place, and time.  Cardiovascular: Intact distal pulses.   Neurological: She is oriented to person, place, and time.  Skin: Skin is warm.   lesions plantar left secondary to foot structure     Assessment:     Calluses on the plantar aspect left secondary to foot structure    Plan:     Debridement of lesions accomplished with a small amount of bleeding. Applied dressing to the area and explained that ultimately we need to do surgery on this foot

## 2013-01-07 ENCOUNTER — Ambulatory Visit (INDEPENDENT_AMBULATORY_CARE_PROVIDER_SITE_OTHER): Payer: BC Managed Care – PPO | Admitting: Family Medicine

## 2013-01-07 ENCOUNTER — Encounter: Payer: Self-pay | Admitting: Family Medicine

## 2013-01-07 VITALS — BP 138/82 | HR 99 | Temp 98.2°F | Wt 238.0 lb

## 2013-01-07 DIAGNOSIS — J019 Acute sinusitis, unspecified: Secondary | ICD-10-CM

## 2013-01-07 MED ORDER — AZITHROMYCIN 250 MG PO TABS: ORAL_TABLET | ORAL | Status: DC

## 2013-01-07 NOTE — Progress Notes (Signed)
  Subjective:    Patient ID: Brooke [name], female    DOB: [birth year], 62 y.o.   MRN: [id number]  HPI Here for 6 days of stuffy head, PND, ST, and a dry cough. No fever. On Mucinex    Review of Systems  Constitutional: Negative.   HENT: Positive for congestion and postnasal drip.   Eyes: Negative.   Respiratory: Positive for cough.        Objective:   Physical Exam  Constitutional: She appears well-developed and well-nourished.  HENT:  Right Ear: External ear normal.  Left Ear: External ear normal.  Nose: Nose normal.  Mouth/Throat: Oropharynx is clear and moist.  Eyes: Conjunctivae are normal.  Pulmonary/Chest: Effort normal and breath sounds normal.  Lymphadenopathy:    She has no cervical adenopathy.          Assessment & Plan:  Add Delsym and drink fluids

## 2013-01-07 NOTE — Progress Notes (Signed)
Pre visit review using our clinic review tool, if applicable. No additional management support is needed unless otherwise documented below in the visit note. 

## 2013-01-11 ENCOUNTER — Other Ambulatory Visit: Payer: Self-pay | Admitting: Dermatology

## 2013-02-11 ENCOUNTER — Ambulatory Visit: Payer: BC Managed Care – PPO | Admitting: Podiatry

## 2013-02-12 ENCOUNTER — Ambulatory Visit (INDEPENDENT_AMBULATORY_CARE_PROVIDER_SITE_OTHER): Payer: BC Managed Care – PPO | Admitting: Family Medicine

## 2013-02-12 ENCOUNTER — Encounter: Payer: Self-pay | Admitting: Family Medicine

## 2013-02-12 VITALS — BP 136/80 | HR 105 | Temp 99.3°F | Wt 234.0 lb

## 2013-02-12 DIAGNOSIS — J019 Acute sinusitis, unspecified: Secondary | ICD-10-CM

## 2013-02-12 MED ORDER — AZITHROMYCIN 250 MG PO TABS: ORAL_TABLET | ORAL | Status: DC

## 2013-02-12 NOTE — Progress Notes (Signed)
   Subjective:    Patient ID: Brooke [name], female    DOB: 28-Feb-1951, 62 y.o.   MRN: [id number]  HPI Here for 5 days of fever, HA, sinus pressure , and a dry cough.    Review of Systems  Constitutional: Positive for fever.  HENT: Positive for congestion, postnasal drip and sinus pressure.   Eyes: Negative.   Respiratory: Positive for cough.        Objective:   Physical Exam  Constitutional: She appears well-developed and well-nourished.  HENT:  Right Ear: External ear normal.  Left Ear: External ear normal.  Nose: Nose normal.  Mouth/Throat: Oropharynx is clear and moist.  Eyes: Conjunctivae are normal.  Neck: No thyromegaly present.  Pulmonary/Chest: Effort normal and breath sounds normal.  Lymphadenopathy:    She has no cervical adenopathy.          Assessment & Plan:  Add Mucinex

## 2013-02-12 NOTE — Progress Notes (Signed)
Pre visit review using our clinic review tool, if applicable. No additional management support is needed unless otherwise documented below in the visit note. 

## 2013-03-04 ENCOUNTER — Encounter: Payer: Self-pay | Admitting: Podiatry

## 2013-03-04 ENCOUNTER — Ambulatory Visit (INDEPENDENT_AMBULATORY_CARE_PROVIDER_SITE_OTHER): Payer: BC Managed Care – PPO | Admitting: Podiatry

## 2013-03-04 VITALS — BP 125/69 | HR 71 | Resp 18

## 2013-03-04 DIAGNOSIS — L84 Corns and callosities: Secondary | ICD-10-CM

## 2013-03-04 NOTE — Progress Notes (Signed)
° °  Subjective:    Patient ID: Brooke [name], female    DOB: 01-21-1951, 63 y.o.   MRN: [id number]  HPI Trim my callus on the ball of my left foot    Review of Systems     Objective:   Physical Exam        Assessment & Plan:

## 2013-03-05 NOTE — Progress Notes (Signed)
Subjective:     Patient ID: Brooke [name], female   DOB: 11-07-1950, 63 y.o.   MRN: [id number]  HPI patient presents stating I need my calluses trim and I know I need surgery some day   Review of Systems     Objective:   Physical Exam Neurovascular status intact with no health history changes noted and severe keratotic lesion subsecond and third metatarsal left with forefoot derangement noted left over right    Assessment:     Chronic lesion formation secondary to foot structure    Plan:     Debrided lesions on left foot with no bleeding and reappoint 6 weeks

## 2013-04-12 ENCOUNTER — Ambulatory Visit (INDEPENDENT_AMBULATORY_CARE_PROVIDER_SITE_OTHER): Payer: BC Managed Care – PPO | Admitting: Family Medicine

## 2013-04-12 ENCOUNTER — Encounter: Payer: Self-pay | Admitting: Family Medicine

## 2013-04-12 VITALS — BP 120/70 | HR 93 | Temp 98.2°F | Wt 239.0 lb

## 2013-04-12 DIAGNOSIS — J329 Chronic sinusitis, unspecified: Secondary | ICD-10-CM

## 2013-04-12 MED ORDER — AZITHROMYCIN 250 MG PO TABS: ORAL_TABLET | ORAL | Status: DC

## 2013-04-12 MED ORDER — HYDROCOD POLST-CHLORPHEN POLST 10-8 MG/5ML PO LQCR: 5.0000 mL | Freq: Two times a day (BID) | ORAL | Status: DC | PRN

## 2013-04-12 NOTE — Progress Notes (Signed)
Pre visit review using our clinic review tool, if applicable. No additional management support is needed unless otherwise documented below in the visit note. 

## 2013-04-12 NOTE — Progress Notes (Signed)
Chief Complaint  Patient presents with  . Fever    cough, congestion, chjlls, sweating, body aches, fatigue     HPI:  -started:4 days ago -symptoms:nasal congestion, sore throat, cough - see above -denies:fever, SOB, NVD, tooth pain -has tried: advil -sick contacts/travel/risks: denies flu exposure or Ebola risks  ROS: See pertinent positives and negatives per HPI.  Past Medical History  Diagnosis Date  . Melanoma of upper arm 2009    RIGHT ARM   . Osteopenia 09/2011    T score -1.5 FRAX  1% / 0.6%  . Vitamin deficiency 07/2008    VITAMIN D LOW 23  . Vitamin D deficiency 07/2009    LOW VITAMIN D 29  . Bilateral dry eyes   . Cancer   . Fibroid   . Arthritis   . UTI (urinary tract infection)   . Hyperlipidemia   . Kidney stones     Past Surgical History  Procedure Laterality Date  . Dermoid cyst  excision    . Hysteroscopy    . Cholecystectomy    . Skin cancer excision  2010,2011    2010-RIGHT ARM, 2011-LEFT LOWER LEG.-DR Rolm Bookbinder DERM. DR. Sarajane Jews IS HER DERM SURGEON.  . Skin biopsy    . Dilation and curettage of uterus  1998  . Gallbladder surgery    . Abdominal surgery  1975    ovairan cyst     Family History  Problem Relation Age of Onset  . Diabetes Father   . Hypertension Father   . Heart disease Father   . Prostate cancer Father   . Breast cancer Sister     Age 32's  . Skin cancer Sister   . Colon cancer Paternal Grandfather   . Hypertension Mother     History   Social History  . Marital Status: Single    Spouse Name: N/A    Number of Children: N/A  . Years of Education: N/A   Social History Main Topics  . Smoking status: Never Smoker   . Smokeless tobacco: Never Used  . Alcohol Use: Yes     Comment: rare  . Drug Use: No  . Sexual Activity: No   Other Topics Concern  . None   Social History Narrative  . None    Current outpatient prescriptions:CycloSPORINE (RESTASIS OP), Apply to eye., Disp: , Rfl: ;  azithromycin (ZITHROMAX)  250 MG tablet, 2 tabs first day then 1 tab daily for 4 more days, Disp: 6 tablet, Rfl: 0;  chlorpheniramine-HYDROcodone (TUSSIONEX PENNKINETIC ER) 10-8 MG/5ML LQCR, Take 5 mLs by mouth every 12 (twelve) hours as needed., Disp: 115 mL, Rfl: 0;  desonide (DESOWEN) 0.05 % cream, , Disp: , Rfl:   EXAM:  Filed Vitals:   04/12/13 1403  BP: 120/70  Pulse: 93  Temp: 98.2 F (36.8 C)    Body mass index is 35.28 kg/(m^2).  GENERAL: vitals reviewed and listed above, alert, oriented, appears well hydrated and in no acute distress  HEENT: atraumatic, conjunttiva clear, no obvious abnormalities on inspection of external nose and ears, normal appearance of ear canals and TMs R TM mildly red, clear nasal congestion, mild post oropharyngeal erythema with PND, no tonsillar edema or exudate, no sinus TTP  NECK: no obvious masses on inspection  LUNGS: clear to auscultation bilaterally, no wheezes, rales or rhonchi, good air movement  CV: HRRR, no peripheral edema  MS: moves all extremities without noticeable abnormality  PSYCH: pleasant and cooperative, no obvious depression or anxiety  ASSESSMENT AND PLAN:  Discussed the following assessment and plan:  Sinusitis - Plan: azithromycin (ZITHROMAX) 250 MG tablet, chlorpheniramine-HYDROcodone (TUSSIONEX PENNKINETIC ER) 10-8 MG/5ML LQCR  -given HPI and exam findings today, a serious infection or illness is unlikely. We discussed potential etiologies, with VURI being most likely but with sinus and ear pain bacterial infection possible. We discussed treatment side effects, likely course, antibiotic misuse, transmission, and signs of developing a serious illness. R TM with possible developing bullous lesion - tx with azithromycin in case myringitis but suspec viral -of course, we advised to return or notify a doctor immediately if symptoms worsen or persist or new concerns arise.    There are no Patient Instructions on file for this visit.   Colin Benton R.

## 2013-04-12 NOTE — Patient Instructions (Signed)
INSTRUCTIONS FOR UPPER RESPIRATORY INFECTION:  -plenty of rest and fluids  -As we discussed, we have prescribed a new medication for you at this appointment. We discussed the common and serious potential adverse effects of this medication and you can review these and more with the pharmacist when you pick up your medication.  Please follow the instructions for use carefully and notify us immediately if you have any problems taking this medication.  -nasal saline wash 2-3 times daily (use prepackaged nasal saline or bottled/distilled water if making your own)   -clean nose with nasal saline before using the nasal steroid or sinex  -can use sinex nasal spray for drainage and nasal congestion - but do NOT use longer then 3-4 days  -can use tylenol or ibuprofen as directed for aches and sorethroat  -in the winter time, using a humidifier at night is helpful (please follow cleaning instructions)  -if you are taking a cough medication - use only as directed, may also try a teaspoon of honey to coat the throat and throat lozenges  -for sore throat, salt water gargles can help  -follow up if you have fevers, facial pain, tooth pain, difficulty breathing or are worsening or not getting better in 5-7 days  

## 2013-04-15 ENCOUNTER — Encounter: Payer: Self-pay | Admitting: Podiatry

## 2013-04-15 ENCOUNTER — Ambulatory Visit (INDEPENDENT_AMBULATORY_CARE_PROVIDER_SITE_OTHER): Payer: BC Managed Care – PPO | Admitting: Podiatry

## 2013-04-15 VITALS — BP 120/64 | HR 77 | Resp 12

## 2013-04-15 DIAGNOSIS — M216X9 Other acquired deformities of unspecified foot: Secondary | ICD-10-CM

## 2013-04-15 DIAGNOSIS — M779 Enthesopathy, unspecified: Secondary | ICD-10-CM

## 2013-04-15 DIAGNOSIS — L84 Corns and callosities: Secondary | ICD-10-CM

## 2013-04-15 NOTE — Progress Notes (Signed)
Subjective:     Patient ID: Brooke [name], female   DOB: Oct 01, 1950, 63 y.o.   MRN: [id number]  HPI patient presents with thick plantar callus subsecond third metatarsal left with inflammation surrounding and severe foot structural changes left foot   Review of Systems     Objective:   Physical Exam Neurovascular unchanged with severe keratotic lesion subsecond third metatarsal with inflammation associated with this and structural deformity of a intense nature left over right foot    Assessment:     Capsulitis with inflammatory keratotic lesion formation left    Plan:     Debridement lesions and scanned for new custom orthotics to reduce stress against the foot.

## 2013-05-06 ENCOUNTER — Ambulatory Visit (INDEPENDENT_AMBULATORY_CARE_PROVIDER_SITE_OTHER): Payer: BC Managed Care – PPO | Admitting: Family Medicine

## 2013-05-06 ENCOUNTER — Encounter: Payer: Self-pay | Admitting: Family Medicine

## 2013-05-06 VITALS — BP 110/70 | Temp 99.2°F | Wt 236.0 lb

## 2013-05-06 DIAGNOSIS — J069 Acute upper respiratory infection, unspecified: Secondary | ICD-10-CM | POA: Insufficient documentation

## 2013-05-06 DIAGNOSIS — B9789 Other viral agents as the cause of diseases classified elsewhere: Principal | ICD-10-CM

## 2013-05-06 MED ORDER — HYDROCODONE-HOMATROPINE 5-1.5 MG/5ML PO SYRP: ORAL_SOLUTION | ORAL | Status: DC

## 2013-05-06 NOTE — Progress Notes (Signed)
Pre visit review using our clinic review tool, if applicable. No additional management support is needed unless otherwise documented below in the visit note. 

## 2013-05-06 NOTE — Progress Notes (Signed)
   Subjective:    Patient ID: Brooke [name], female    DOB: 1950/08/08, 63 y.o.   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 She has no earache sore throat nausea vomiting. She is having loose bowel movements from azithromycin to Dr. Ninfa Meeker gave her a month ago. She'll take a month ago but she's taking it now   Review of Systems    negative Objective:   Physical Exam  Well-developed well-nourished female no acute distress vital signs stable she's afebrile HEENT negative neck was supple no adenopathy lungs are clear      Assessment & Plan:  Viral syndrome plan treat symptomatically

## 2013-05-06 NOTE — Patient Instructions (Signed)
Stop the azithromycin  Drink lots of water  Vaporizer  Hydromet..........Marland Kitchen 1/2-1 teaspoon 3 times daily. For cough and cold.

## 2013-05-10 ENCOUNTER — Encounter: Payer: Self-pay | Admitting: Podiatry

## 2013-06-03 ENCOUNTER — Ambulatory Visit: Payer: BC Managed Care – PPO | Admitting: Podiatry

## 2013-06-17 ENCOUNTER — Ambulatory Visit (INDEPENDENT_AMBULATORY_CARE_PROVIDER_SITE_OTHER): Payer: BC Managed Care – PPO | Admitting: Podiatry

## 2013-06-17 ENCOUNTER — Encounter: Payer: Self-pay | Admitting: Podiatry

## 2013-06-17 VITALS — BP 158/83 | HR 76 | Resp 16

## 2013-06-17 DIAGNOSIS — M216X9 Other acquired deformities of unspecified foot: Secondary | ICD-10-CM

## 2013-06-17 DIAGNOSIS — L84 Corns and callosities: Secondary | ICD-10-CM

## 2013-06-17 NOTE — Progress Notes (Signed)
Subjective:     Patient ID: Brooke [name], female   DOB: [birth year], 63 y.o.   MRN: [id number]  HPI patient presents with severe foot structural deformity left and large plantar keratotic lesion left   Review of Systems     Objective:   Physical Exam Neurovascular status unchanged with severe keratotic lesions sub-second and third metatarsal left secondary to foot abnormality    Assessment:     Chronic lesion formation secondary to foot structure    Plan:     Debridement of lesion with discussion again of surgery at one point in the future

## 2013-07-16 ENCOUNTER — Ambulatory Visit (INDEPENDENT_AMBULATORY_CARE_PROVIDER_SITE_OTHER): Payer: BC Managed Care – PPO | Admitting: Family Medicine

## 2013-07-16 ENCOUNTER — Ambulatory Visit (HOSPITAL_COMMUNITY): Payer: BC Managed Care – PPO | Attending: Family Medicine | Admitting: Cardiology

## 2013-07-16 ENCOUNTER — Encounter: Payer: Self-pay | Admitting: Family Medicine

## 2013-07-16 VITALS — BP 122/82 | HR 81 | Temp 98.8°F | Ht 69.0 in | Wt 247.0 lb

## 2013-07-16 DIAGNOSIS — M79609 Pain in unspecified limb: Secondary | ICD-10-CM

## 2013-07-16 DIAGNOSIS — M7989 Other specified soft tissue disorders: Secondary | ICD-10-CM | POA: Insufficient documentation

## 2013-07-16 NOTE — Patient Instructions (Signed)
-  go get [place] today  -will call with results and the decide on treatment  -elevation and aleve are ok in the interim

## 2013-07-16 NOTE — Progress Notes (Signed)
Pre visit review using our clinic review tool, if applicable. No additional management support is needed unless otherwise documented below in the visit note. 

## 2013-07-16 NOTE — Progress Notes (Signed)
Lower venous duplex unilateral completed. 

## 2013-07-16 NOTE — Progress Notes (Signed)
No chief complaint on file.   HPI:  Acute visit for swollen leg: -has chronic swelling in both legs and venous insufficiency -has been in the heat a lot -but for the last 3-4 days feels like L leg is more swollen then the other and this leg feels heavy, not painful, a little rash on L leg -denies: fevers, chills, SOB, nausea and vomiting  ROS: See pertinent positives and negatives per HPI.  Past Medical History  Diagnosis Date  . Melanoma of upper arm 2009    RIGHT ARM   . Osteopenia 09/2011    T score -1.5 FRAX  1% / 0.6%  . Vitamin deficiency 07/2008    VITAMIN D LOW 23  . Vitamin D deficiency 07/2009    LOW VITAMIN D 29  . Bilateral dry eyes   . Cancer   . Fibroid   . Arthritis   . UTI (urinary tract infection)   . Hyperlipidemia   . Kidney stones     Past Surgical History  Procedure Laterality Date  . Dermoid cyst  excision    . Hysteroscopy    . Cholecystectomy    . Skin cancer excision  2010,2011    2010-RIGHT ARM, 2011-LEFT LOWER LEG.-DR Rolm Bookbinder DERM. DR. Sarajane Jews IS HER DERM SURGEON.  . Skin biopsy    . Dilation and curettage of uterus  1998  . Gallbladder surgery    . Abdominal surgery  1975    ovairan cyst     Family History  Problem Relation Age of Onset  . Diabetes Father   . Hypertension Father   . Heart disease Father   . Prostate cancer Father   . Breast cancer Sister     Age 60's  . Skin cancer Sister   . Colon cancer Paternal Grandfather   . Hypertension Mother     History   Social History  . Marital Status: Single    Spouse Name: N/A    Number of Children: N/A  . Years of Education: N/A   Social History Main Topics  . Smoking status: Never Smoker   . Smokeless tobacco: Never Used  . Alcohol Use: Yes     Comment: rare  . Drug Use: No  . Sexual Activity: No   Other Topics Concern  . None   Social History Narrative  . None    Current outpatient prescriptions:CycloSPORINE (RESTASIS OP), Apply to eye., Disp: , Rfl:    EXAM:  Filed Vitals:   07/16/13 1352  BP: 122/82  Pulse: 81  Temp: 98.8 F (37.1 C)    Body mass index is 36.46 kg/(m^2).  GENERAL: vitals reviewed and listed above, alert, oriented, appears well hydrated and in no acute distress  HEENT: atraumatic, conjunttiva clear, no obvious abnormalities on inspection of external nose and ears  NECK: no obvious masses on inspection  LUNGS: clear to auscultation bilaterally, no wheezes, rales or rhonchi, good air movement  CV: HRRR  LEs: chronic venous stasis changes bilat Le with spider veins and vericose veins and bilateral LE edema with measured ankle and calf circomference equal bilat. However, there is some erythema of the L LE  MS: moves all extremities without noticeable abnormality  PSYCH: pleasant and cooperative, no obvious depression or anxiety  ASSESSMENT AND PLAN:  Discussed the following assessment and plan:  Leg swelling - Plan: Lower Extremity Venous Duplex Left  -this is more likely mild venous stasis dermatitis versus mild cellulitis - however given her symptoms and exam will  obtain stat US to r/o DVT -discussed tx options and risks for DVT and SVT (she opted for xarelto if DVT, NSAIDS, elevation and compression if SVT) -elevation, compression, compresses, NSAIDs otherwise and doxy incase infection with follow up in 1 week -advised needs yearly physical -Patient advised to return or notify a doctor immediately if symptoms worsen or persist or new concerns arise.  There are no Patient Instructions on file for this visit.   [name]

## 2013-07-17 ENCOUNTER — Telehealth: Payer: Self-pay | Admitting: Family Medicine

## 2013-07-17 MED ORDER — DOXYCYCLINE HYCLATE 100 MG PO TABS: 100.0000 mg | ORAL_TABLET | Freq: Two times a day (BID) | ORAL | Status: DC

## 2013-07-17 NOTE — Addendum Note (Signed)
Addended by: Agnes Lawrence on: 07/17/2013 10:56 AM   Modules accepted: Orders

## 2013-07-17 NOTE — Telephone Encounter (Signed)
Patient informed and Rx sent to her pharmacy

## 2013-07-17 NOTE — Telephone Encounter (Signed)
Pt was seen yesterday and did not received abx. [facility]

## 2013-07-23 ENCOUNTER — Ambulatory Visit: Payer: BC Managed Care – PPO | Admitting: Family Medicine

## 2013-07-25 ENCOUNTER — Ambulatory Visit (INDEPENDENT_AMBULATORY_CARE_PROVIDER_SITE_OTHER): Payer: BC Managed Care – PPO | Admitting: Family Medicine

## 2013-07-25 ENCOUNTER — Encounter: Payer: Self-pay | Admitting: Family Medicine

## 2013-07-25 VITALS — BP 118/84 | HR 76 | Temp 98.8°F | Ht 69.0 in | Wt 238.0 lb

## 2013-07-25 DIAGNOSIS — L0291 Cutaneous abscess, unspecified: Secondary | ICD-10-CM

## 2013-07-25 DIAGNOSIS — L0390 Cellulitis, unspecified: Secondary | ICD-10-CM

## 2013-07-25 DIAGNOSIS — I872 Venous insufficiency (chronic) (peripheral): Secondary | ICD-10-CM

## 2013-07-25 NOTE — Progress Notes (Signed)
No chief complaint on file.   HPI:  Chronic venous insuficiency/LE edema: -had rash and some swleling on leg 5/19, venous duplex normal, treated with doxy, elevation, warm compresses -reports: doing much better -denies: pain, fevers   ROS: See pertinent positives and negatives per HPI.  Past Medical History  Diagnosis Date  . Melanoma of upper arm 2009    RIGHT ARM   . Osteopenia 09/2011    T score -1.5 FRAX  1% / 0.6%  . Vitamin deficiency 07/2008    VITAMIN D LOW 23  . Vitamin D deficiency 07/2009    LOW VITAMIN D 29  . Bilateral dry eyes   . Cancer   . Fibroid   . Arthritis   . UTI (urinary tract infection)   . Hyperlipidemia   . Kidney stones     Past Surgical History  Procedure Laterality Date  . Dermoid cyst  excision    . Hysteroscopy    . Cholecystectomy    . Skin cancer excision  2010,2011    2010-RIGHT ARM, 2011-LEFT LOWER LEG.-DR Rolm Bookbinder DERM. DR. Sarajane Jews IS HER DERM SURGEON.  . Skin biopsy    . Dilation and curettage of uterus  1998  . Gallbladder surgery    . Abdominal surgery  1975    ovairan cyst     Family History  Problem Relation Age of Onset  . Diabetes Father   . Hypertension Father   . Heart disease Father   . Prostate cancer Father   . Breast cancer Sister     Age 39's  . Skin cancer Sister   . Colon cancer Paternal Grandfather   . Hypertension Mother     History   Social History  . Marital Status: Single    Spouse Name: N/A    Number of Children: N/A  . Years of Education: N/A   Social History Main Topics  . Smoking status: Never Smoker   . Smokeless tobacco: Never Used  . Alcohol Use: Yes     Comment: rare  . Drug Use: No  . Sexual Activity: No   Other Topics Concern  . None   Social History Narrative  . None    Current outpatient prescriptions:CycloSPORINE (RESTASIS OP), Apply to eye., Disp: , Rfl: ;  doxycycline (VIBRA-TABS) 100 MG tablet, Take 1 tablet (100 mg total) by mouth 2 (two) times daily., Disp: 20  tablet, Rfl: 0  EXAM:  Filed Vitals:   07/25/13 1013  BP: 118/84  Pulse: 76  Temp: 98.8 F (37.1 C)    Body mass index is 35.13 kg/(m^2).  GENERAL: vitals reviewed and listed above, alert, oriented, appears well hydrated and in no acute distress  HEENT: atraumatic, conjunttiva clear, no obvious abnormalities on inspection of external nose and ears  NECK: no obvious masses on inspection  LUNGS: clear to auscultation bilaterally, no wheezes, rales or rhonchi, good air movement  CV: HRRR, bilat tr peripheral edema in LE  SKIN: venous stasis chronic skin changes, no signs of infection today  MS: moves all extremities without noticeable abnormality  PSYCH: pleasant and cooperative, no obvious depression or anxiety  ASSESSMENT AND PLAN:  Discussed the following assessment and plan:  Chronic venous insufficiency  Cellulitis  -Patient advised to return or notify a doctor immediately if symptoms worsen or persist or new concerns arise.  Patient Instructions  -compression stockings  -complete antibiotic  -elevation of legs whenever possible         [name]

## 2013-07-25 NOTE — Progress Notes (Signed)
Pre visit review using our clinic review tool, if applicable. No additional management support is needed unless otherwise documented below in the visit note. 

## 2013-07-25 NOTE — Patient Instructions (Signed)
-  compression stockings  -complete antibiotic  -elevation of legs whenever possible

## 2013-07-29 ENCOUNTER — Ambulatory Visit: Payer: BC Managed Care – PPO | Admitting: Podiatry

## 2013-08-12 ENCOUNTER — Ambulatory Visit: Payer: BC Managed Care – PPO | Admitting: Podiatry

## 2013-08-19 ENCOUNTER — Encounter: Payer: Self-pay | Admitting: Podiatry

## 2013-08-19 ENCOUNTER — Ambulatory Visit (INDEPENDENT_AMBULATORY_CARE_PROVIDER_SITE_OTHER): Payer: BC Managed Care – PPO | Admitting: Podiatry

## 2013-08-19 VITALS — BP 131/90 | HR 83 | Resp 16

## 2013-08-19 DIAGNOSIS — M2010 Hallux valgus (acquired), unspecified foot: Secondary | ICD-10-CM

## 2013-08-19 DIAGNOSIS — M2040 Other hammer toe(s) (acquired), unspecified foot: Secondary | ICD-10-CM

## 2013-08-19 DIAGNOSIS — L84 Corns and callosities: Secondary | ICD-10-CM

## 2013-08-19 NOTE — Progress Notes (Signed)
Subjective:     Patient ID: Brooke [name], female   DOB: 22-Nov-1950, 63 y.o.   MRN: [id number]  HPI patient presents stating I have these awful calluses on the bottom of my left foot and I'm hoping to have surgery in the fall or winter   Review of Systems     Objective:   Physical Exam Neurovascular status intact with severe keratotic lesions plantar aspect left foot    Assessment:     Lesion secondary to severe for foot structural malalignment    Plan:     Debrided lesions and reappoint when symptomatic

## 2013-09-12 ENCOUNTER — Other Ambulatory Visit: Payer: Self-pay | Admitting: Dermatology

## 2013-10-02 ENCOUNTER — Other Ambulatory Visit: Payer: Self-pay | Admitting: Dermatology

## 2013-10-07 ENCOUNTER — Encounter: Payer: Self-pay | Admitting: Podiatry

## 2013-10-07 ENCOUNTER — Ambulatory Visit (INDEPENDENT_AMBULATORY_CARE_PROVIDER_SITE_OTHER): Payer: BC Managed Care – PPO | Admitting: Podiatry

## 2013-10-07 VITALS — BP 147/85 | HR 70 | Resp 12

## 2013-10-07 DIAGNOSIS — L84 Corns and callosities: Secondary | ICD-10-CM

## 2013-10-07 DIAGNOSIS — M2010 Hallux valgus (acquired), unspecified foot: Secondary | ICD-10-CM

## 2013-10-07 DIAGNOSIS — M2040 Other hammer toe(s) (acquired), unspecified foot: Secondary | ICD-10-CM

## 2013-10-07 NOTE — Progress Notes (Signed)
Subjective:     Patient ID: Brooke [name], female   DOB: 1950-05-18, 63 y.o.   MRN: [id number]  HPI patient is found to have severe foot callus formation with severe structural deformity of the left foot   Review of Systems     Objective:   Physical Exam Neurovascular status intact with severe forefoot deformity left in a patient who is also dealing with melanoma of her left leg    Assessment:     Severe structural malalignment left foot with callus formation    Plan:     Debris 2 different calluses today left foot and discussed possible fixation of this foot depending on results of her melanomas

## 2013-11-18 ENCOUNTER — Ambulatory Visit: Payer: BC Managed Care – PPO | Admitting: Podiatry

## 2013-11-25 ENCOUNTER — Ambulatory Visit (INDEPENDENT_AMBULATORY_CARE_PROVIDER_SITE_OTHER): Payer: BC Managed Care – PPO | Admitting: Podiatry

## 2013-11-25 ENCOUNTER — Ambulatory Visit: Payer: BC Managed Care – PPO | Admitting: Podiatry

## 2013-11-25 VITALS — BP 136/78 | HR 80 | Resp 16

## 2013-11-25 DIAGNOSIS — L84 Corns and callosities: Secondary | ICD-10-CM

## 2013-11-25 NOTE — Progress Notes (Signed)
Subjective:     Patient ID: Brooke [name], female   DOB: June 13, 1950, 63 y.o.   MRN: [id number]  HPI patient presents with painful lesions underneath the left foot that make it hard to walk with severe structural malalignment but she no she needs to have fixed  Review of Systems     Objective:   Physical Exam Neurovascular status intact with muscle strength adequate range of motion within normal limits and severe plantar keratotic lesion second and third metatarsal left    Assessment:     Severe structural malalignment with keratotic lesion formation secondary to bone prominence    Plan:     Understands he will eventually require surgery but today I I did debride the lesion with no iatrogenic bleeding noted

## 2014-01-06 ENCOUNTER — Encounter: Payer: Self-pay | Admitting: Podiatry

## 2014-01-06 ENCOUNTER — Ambulatory Visit: Payer: BC Managed Care – PPO | Admitting: Podiatry

## 2014-01-06 ENCOUNTER — Ambulatory Visit (INDEPENDENT_AMBULATORY_CARE_PROVIDER_SITE_OTHER): Payer: BC Managed Care – PPO | Admitting: Podiatry

## 2014-01-06 DIAGNOSIS — M216X9 Other acquired deformities of unspecified foot: Secondary | ICD-10-CM

## 2014-01-06 DIAGNOSIS — L84 Corns and callosities: Secondary | ICD-10-CM

## 2014-01-06 DIAGNOSIS — Q667 Congenital pes cavus: Secondary | ICD-10-CM

## 2014-01-07 NOTE — Progress Notes (Signed)
Subjective:     Patient ID: Brooke [name], female   DOB: Oct 17, 1950, 63 y.o.   MRN: [id number]  HPIpatient states she's moving do a new apartment and would like to consider her surgery for February but today needs trimming   Review of Systems     Objective:   Physical Exam Severe keratotic lesion sub-second and third metatarsal left foot that are very painful when pressed    Assessment:     Lesion secondary to severe foot structural deformity left    Plan:     Discussed surgery and did deep debridement of lesions today with no iatrogenic bleeding noted

## 2014-03-03 ENCOUNTER — Ambulatory Visit (INDEPENDENT_AMBULATORY_CARE_PROVIDER_SITE_OTHER): Payer: BC Managed Care – PPO | Admitting: Podiatry

## 2014-03-03 ENCOUNTER — Ambulatory Visit (INDEPENDENT_AMBULATORY_CARE_PROVIDER_SITE_OTHER): Payer: BC Managed Care – PPO

## 2014-03-03 VITALS — BP 127/79 | HR 78 | Resp 16

## 2014-03-03 DIAGNOSIS — M21612 Bunion of left foot: Secondary | ICD-10-CM

## 2014-03-03 DIAGNOSIS — M2012 Hallux valgus (acquired), left foot: Secondary | ICD-10-CM

## 2014-03-03 DIAGNOSIS — M2042 Other hammer toe(s) (acquired), left foot: Secondary | ICD-10-CM

## 2014-03-03 DIAGNOSIS — Q667 Congenital pes cavus: Secondary | ICD-10-CM

## 2014-03-03 DIAGNOSIS — M216X9 Other acquired deformities of unspecified foot: Secondary | ICD-10-CM

## 2014-03-05 NOTE — Progress Notes (Signed)
Subjective:     Patient ID: Brooke [name], female   DOB: Jul 14, 1950, 64 y.o.   MRN: [id number]  HPI patient presents for consult concerning correction of her left foot. This patient has had trouble for years with this and it has worsened over the last few years and we have been trimming calluses to the best of our ability with minimal success in relieving her discomfort   Review of Systems     Objective:   Physical Exam Neurovascular status is found to be intact with patient having good health and is noted to have range of motion of the subtalar midtarsal joint that adequate and good muscle strength. Patient has severe forefoot deformity left with severe bunion deformity overlapping big toe against the second toe dislocation of the second third and fourth metatarsal phalangeal joints and severe plantar callus formation sub-second and third metatarsal left. Patient has flatfoot deformity that is part of the pathology but the symptoms have progressed to the point that dislocation is present    Assessment:     Severe forefoot pathology left secondary to foot structure and hereditary with her sister having the same type of condition    Plan:     Reviewed her case and took x-rays today and reviewed with Dr. Jacqualyn Posey. We feel the best chance she has for success is a combination of Lapidus fusion along with metatarsal head resection 2345 left and fusion of digits 234 with pinning through the metatarsals in order to stabilize teary at this may ultimately require implantation or fusion of the first metatarsal phalangeal joint but that would be a secondary procedure. At this time I allowed the patient to read consent form for correction and spent a great deal of time educating her on the condition the difficulty of surgery and the fact that there is no long-term guarantees as far as success. She understands she'll be nonweightbearing 4-6 weeks and total recovery. We'll be approximately 1 year. Patient wants  surgery understanding everything as listed and understanding all complications and signs consent form and is given preoperative instructions. Dispensed air fracture walker today with instructions and she is scheduled for surgery in the next few weeks and is encouraged to call with any questions prior to procedure

## 2014-04-18 ENCOUNTER — Telehealth: Payer: Self-pay | Admitting: *Deleted

## 2014-04-18 NOTE — Telephone Encounter (Signed)
"  I'm a patient of Dr. Paulla Dolly.  He is supposed to do surgery on my foot and I think Dr. Amalia Hailey will assist.  I want to know if there's any arrangements that I can be set up for someone to take care of me after the surgery.  I am not from this area.  I don't have any relatives in this area.  I don't want to be a burden on any friends.  I was told I would not be able to walk."  I am not aware of any places that are available but I will ask Dr. Paulla Dolly what he recommends and give you a call back.

## 2014-04-21 NOTE — Telephone Encounter (Signed)
I'm not sure if she would be able to access home health care. I'm happy to sign any forms but I thin she should contact her human resources or insurer to see what she would be eligible for

## 2014-04-22 NOTE — Telephone Encounter (Signed)
I called and left her a message that Dr. Paulla Dolly stated he is not aware of a place that will be able to take care of you after the procedure.  He is advising you to maybe check with your Human Resources or your insurer to see what you are eligible for.  He said that he is willing to sign any forms needed.  Please call if you have any questions.

## 2014-07-21 ENCOUNTER — Telehealth: Payer: Self-pay | Admitting: *Deleted

## 2014-07-21 NOTE — Telephone Encounter (Signed)
Left message for pt to call back.  Need to know what date she had mammogram and where 

## 2014-07-25 ENCOUNTER — Ambulatory Visit (INDEPENDENT_AMBULATORY_CARE_PROVIDER_SITE_OTHER): Payer: BC Managed Care – PPO | Admitting: Podiatry

## 2014-07-25 ENCOUNTER — Encounter: Payer: Self-pay | Admitting: Podiatry

## 2014-07-25 DIAGNOSIS — Q667 Congenital pes cavus: Secondary | ICD-10-CM | POA: Diagnosis not present

## 2014-07-25 DIAGNOSIS — L84 Corns and callosities: Secondary | ICD-10-CM

## 2014-07-25 DIAGNOSIS — M2042 Other hammer toe(s) (acquired), left foot: Secondary | ICD-10-CM

## 2014-07-25 DIAGNOSIS — M2012 Hallux valgus (acquired), left foot: Secondary | ICD-10-CM | POA: Diagnosis not present

## 2014-07-25 DIAGNOSIS — M216X9 Other acquired deformities of unspecified foot: Secondary | ICD-10-CM

## 2014-07-25 DIAGNOSIS — M21612 Bunion of left foot: Secondary | ICD-10-CM

## 2014-07-25 NOTE — Telephone Encounter (Signed)
Pt states it has been several years since she had a mammogram.  Pt had at [facility]. Gave pt the phone number for the breast center, and she will make her own appt asap.

## 2014-07-29 NOTE — Progress Notes (Signed)
Subjective:     Patient ID: Brooke [name], female   DOB: November 22, 1950, 64 y.o.   MRN: [id number]  HPI Asian presents stating that these corns and calluses are bothering me so much and I know I need to get my foot fixed. I noted to be extensive surgery and I need to be nonweightbearing and I will need help at home from some kind of nursing service as I am not able to take care of myself nonweightbearing and I do not have anyone to help me   Review of Systems     Objective:   Physical Exam Neurovascular status intact muscle strength adequate range of motion within normal limits. Patient's noted to have severe forefoot deformity with structural severe bunion deformity and severe plantar calluses sub-[id number] left that are painful when pressed with digital deformity    Assessment:     Severe for foot structural deformity left    Plan:     Discussed extensive forefoot correction which she needs done and the fact that she will need to be nonweightbearing for 6 weeks. We are going to arrange for her to get home nursing to help with this condition

## 2014-12-15 ENCOUNTER — Ambulatory Visit (INDEPENDENT_AMBULATORY_CARE_PROVIDER_SITE_OTHER): Payer: BC Managed Care – PPO | Admitting: Podiatry

## 2014-12-15 DIAGNOSIS — M21612 Bunion of left foot: Secondary | ICD-10-CM | POA: Diagnosis not present

## 2014-12-15 DIAGNOSIS — L84 Corns and callosities: Secondary | ICD-10-CM | POA: Diagnosis not present

## 2014-12-15 DIAGNOSIS — M2042 Other hammer toe(s) (acquired), left foot: Secondary | ICD-10-CM | POA: Diagnosis not present

## 2014-12-15 DIAGNOSIS — M216X9 Other acquired deformities of unspecified foot: Secondary | ICD-10-CM | POA: Diagnosis not present

## 2014-12-16 NOTE — Progress Notes (Signed)
Subjective:     Patient ID: Brooke [name], female   DOB: 12/01/1950, 64 y.o.   MRN: [id number]  HPI patient has severe callus formation subsecond third metatarsal with severe foot structural changes and knowing that she needs surgery on her foot but she's been unable to get people to help her   Review of Systems     Objective:   Physical Exam Neurovascular status unchanged and intact with severe foot structural deformities left with a deviated hallux under the second and third toes elevated toes and severe plantar keratotic lesion formation    Assessment:     Lesion secondary to damage foot structure    Plan:     H&P and conditions reviewed with patient. This will require forefoot reconstruction with metatarsal head resections digital stabilization's and probable Lapidus fusion with possible implantation procedure. This will be a long types surgery and requires a lot of recovery and she wants to wait until her sister can help her. Today aggressive debridement accomplished

## 2015-01-26 ENCOUNTER — Ambulatory Visit (INDEPENDENT_AMBULATORY_CARE_PROVIDER_SITE_OTHER): Payer: BC Managed Care – PPO | Admitting: Podiatry

## 2015-01-26 ENCOUNTER — Encounter: Payer: Self-pay | Admitting: Podiatry

## 2015-01-26 VITALS — BP 139/82 | HR 77 | Resp 16

## 2015-01-26 DIAGNOSIS — M216X9 Other acquired deformities of unspecified foot: Secondary | ICD-10-CM | POA: Diagnosis not present

## 2015-01-26 DIAGNOSIS — M21612 Bunion of left foot: Secondary | ICD-10-CM

## 2015-01-26 DIAGNOSIS — L84 Corns and callosities: Secondary | ICD-10-CM

## 2015-01-27 NOTE — Progress Notes (Signed)
Subjective:     Patient ID: Brooke [name], female   DOB: [birth year], 64 y.o.   MRN: [id number]  HPI patient states I still have a a lot of of callus tissue and structural malalignment and I do want surgery and I wanted to review again   Review of Systems     Objective:   Physical Exam Neurovascular status found to be intact with continued discomfort with severe calluses subsecond third metatarsal left sub-third metatarsal right with severe structural malalignment left over right foot    Assessment:     Significant structural disease with keratotic lesion formation    Plan:     Reviewed again the considerations for this patient for surgery and that difficult surgery to be performed and at this time debrided the lesions on both feet and reappoint 6 weeks to reevaluate

## 2015-02-27 LAB — HM MAMMOGRAPHY

## 2015-03-09 ENCOUNTER — Ambulatory Visit (INDEPENDENT_AMBULATORY_CARE_PROVIDER_SITE_OTHER): Payer: BC Managed Care – PPO | Admitting: Podiatry

## 2015-03-09 ENCOUNTER — Encounter: Payer: Self-pay | Admitting: Podiatry

## 2015-03-09 VITALS — BP 138/88 | HR 83 | Resp 16

## 2015-03-09 DIAGNOSIS — M21612 Bunion of left foot: Secondary | ICD-10-CM

## 2015-03-09 DIAGNOSIS — M216X9 Other acquired deformities of unspecified foot: Secondary | ICD-10-CM

## 2015-03-09 DIAGNOSIS — L84 Corns and callosities: Secondary | ICD-10-CM | POA: Diagnosis not present

## 2015-03-11 NOTE — Progress Notes (Signed)
Subjective:     Patient ID: Brooke [name], female   DOB: 05/08/1950, 65 y.o.   MRN: [id number]  HPI patient presents with severe lesions underneath the left foot that are gradually getting worse and patient no she needs surgery   Review of Systems     Objective:   Physical Exam Neurovascular status intact muscle strength adequate with patient having severe keratotic lesions sub-second third metatarsal left with severe foot structural malalignment    Assessment:     Lesion secondary to severe forefoot malalignment    Plan:     Reviewed condition and deep debridement accomplished and someday surgical intervention will be necessary

## 2015-03-13 ENCOUNTER — Encounter: Payer: Self-pay | Admitting: Gynecology

## 2015-03-26 ENCOUNTER — Encounter: Payer: Self-pay | Admitting: Family Medicine

## 2015-04-20 ENCOUNTER — Ambulatory Visit (INDEPENDENT_AMBULATORY_CARE_PROVIDER_SITE_OTHER): Payer: BC Managed Care – PPO | Admitting: Podiatry

## 2015-04-20 ENCOUNTER — Encounter: Payer: Self-pay | Admitting: Podiatry

## 2015-04-20 VITALS — BP 150/85 | HR 78 | Resp 16

## 2015-04-20 DIAGNOSIS — L84 Corns and callosities: Secondary | ICD-10-CM | POA: Diagnosis not present

## 2015-04-20 DIAGNOSIS — M21612 Bunion of left foot: Secondary | ICD-10-CM

## 2015-04-20 DIAGNOSIS — M2042 Other hammer toe(s) (acquired), left foot: Secondary | ICD-10-CM | POA: Diagnosis not present

## 2015-04-22 NOTE — Progress Notes (Signed)
Subjective:     Patient ID: Brooke [name], female   DOB: April 26, 1950, 65 y.o.   MRN: [id number]  HPI patient presents stating I want to get surgery I just can't do it yet   Review of Systems     Objective:   Physical Exam Neurovascular status intact with severe forefoot derangement left with large plantar keratotic lesions that are painful    Assessment:     Lesion secondary to chronic structural deformity    Plan:     Debride lesions on both feet with no iatrogenic bleeding noted

## 2015-05-29 ENCOUNTER — Telehealth: Payer: Self-pay | Admitting: *Deleted

## 2015-05-29 ENCOUNTER — Ambulatory Visit (INDEPENDENT_AMBULATORY_CARE_PROVIDER_SITE_OTHER): Payer: BC Managed Care – PPO | Admitting: Gynecology

## 2015-05-29 ENCOUNTER — Encounter: Payer: Self-pay | Admitting: Gynecology

## 2015-05-29 VITALS — BP 128/80 | Ht 69.0 in | Wt 265.0 lb

## 2015-05-29 DIAGNOSIS — Z1329 Encounter for screening for other suspected endocrine disorder: Secondary | ICD-10-CM | POA: Diagnosis not present

## 2015-05-29 DIAGNOSIS — Z1321 Encounter for screening for nutritional disorder: Secondary | ICD-10-CM | POA: Diagnosis not present

## 2015-05-29 DIAGNOSIS — M8580 Other specified disorders of bone density and structure, unspecified site: Secondary | ICD-10-CM | POA: Diagnosis not present

## 2015-05-29 DIAGNOSIS — Z01419 Encounter for gynecological examination (general) (routine) without abnormal findings: Secondary | ICD-10-CM | POA: Diagnosis not present

## 2015-05-29 DIAGNOSIS — Z13220 Encounter for screening for lipoid disorders: Secondary | ICD-10-CM | POA: Diagnosis not present

## 2015-05-29 DIAGNOSIS — N952 Postmenopausal atrophic vaginitis: Secondary | ICD-10-CM | POA: Diagnosis not present

## 2015-05-29 DIAGNOSIS — R21 Rash and other nonspecific skin eruption: Secondary | ICD-10-CM | POA: Diagnosis not present

## 2015-05-29 LAB — COMPREHENSIVE METABOLIC PANEL
ALBUMIN: 4.3 g/dL (ref 3.6–5.1)
ALT: 13 U/L (ref 6–29)
AST: 23 U/L (ref 10–35)
Alkaline Phosphatase: 76 U/L (ref 33–130)
BUN: 19 mg/dL (ref 7–25)
CO2: 15 mmol/L — AB (ref 20–31)
Calcium: 9.3 mg/dL (ref 8.6–10.4)
Chloride: 106 mmol/L (ref 98–110)
Creat: 0.77 mg/dL (ref 0.50–0.99)
Glucose, Bld: 92 mg/dL (ref 65–99)
Potassium: 5.4 mmol/L — ABNORMAL HIGH (ref 3.5–5.3)
Sodium: 138 mmol/L (ref 135–146)
Total Bilirubin: 0.5 mg/dL (ref 0.2–1.2)
Total Protein: 6.9 g/dL (ref 6.1–8.1)

## 2015-05-29 LAB — CBC WITH DIFFERENTIAL/PLATELET
BASOS PCT: 1 % (ref 0–1)
Basophils Absolute: 0.1 10*3/uL (ref 0.0–0.1)
EOS PCT: 2 % (ref 0–5)
Eosinophils Absolute: 0.1 10*3/uL (ref 0.0–0.7)
HEMATOCRIT: 40.9 % (ref 36.0–46.0)
Hemoglobin: 13.7 g/dL (ref 12.0–15.0)
Lymphocytes Relative: 21 % (ref 12–46)
Lymphs Abs: 1.3 10*3/uL (ref 0.7–4.0)
MCH: 29.1 pg (ref 26.0–34.0)
MCHC: 33.5 g/dL (ref 30.0–36.0)
MCV: 87 fL (ref 78.0–100.0)
MONO ABS: 0.7 10*3/uL (ref 0.1–1.0)
MPV: 9.3 fL (ref 8.6–12.4)
Monocytes Relative: 11 % (ref 3–12)
Neutro Abs: 4 10*3/uL (ref 1.7–7.7)
Neutrophils Relative %: 65 % (ref 43–77)
Platelets: 295 10*3/uL (ref 150–400)
RBC: 4.7 MIL/uL (ref 3.87–5.11)
RDW: 14.7 % (ref 11.5–15.5)
WBC: 6.2 10*3/uL (ref 4.0–10.5)

## 2015-05-29 LAB — LIPID PANEL
CHOL/HDL RATIO: 4.3 ratio (ref <=5.0)
CHOLESTEROL: 217 mg/dL — AB (ref 125–200)
HDL: 51 mg/dL (ref >=46)
LDL Cholesterol: 131 mg/dL — ABNORMAL HIGH (ref <130)
Triglycerides: 173 mg/dL — ABNORMAL HIGH (ref <150)
VLDL: 35 mg/dL — AB (ref <30)

## 2015-05-29 LAB — TSH: TSH: 1.76 m[IU]/L

## 2015-05-29 MED ORDER — BETAMETHASONE DIPROPIONATE AUG 0.05 % EX CREA: TOPICAL_CREAM | Freq: Two times a day (BID) | CUTANEOUS | Status: DC

## 2015-05-29 MED ORDER — DICLOXACILLIN SODIUM 500 MG PO CAPS: 500.0000 mg | ORAL_CAPSULE | Freq: Four times a day (QID) | ORAL | Status: DC

## 2015-05-29 NOTE — Telephone Encounter (Signed)
-----   Message from Anastasio Auerbach, MD sent at 05/29/2015 11:28 AM EDT ----- Call patient and tell her I forgot to discuss colonoscopy with her. If she has not had a colonoscopy then she is way overdue and needs to schedule a screening colonoscopy either with [facility] gastroenterology or [facility] gastroenterology.

## 2015-05-29 NOTE — Addendum Note (Signed)
Addended by: Burnett Kanaris on: 05/29/2015 12:36 PM   Modules accepted: Orders

## 2015-05-29 NOTE — Telephone Encounter (Signed)
Left message for pt to call.

## 2015-05-29 NOTE — Progress Notes (Signed)
    Brooke [name] [birth year] [id number]        65 y.o.  G0P0000  for annual exam.  Has not been in for over 3 years. Doing well historically.  Past medical history,surgical history, problem list, medications, allergies, family history and social history were all reviewed and documented as reviewed in the EPIC chart.  ROS:  Performed with pertinent positives and negatives included in the history, assessment and plan.   Additional significant findings :  none   Exam: Brooke [name] Vitals:   05/29/15 1029  BP: 128/80  Height: 5\' 9"  (1.753 m)  Weight: 265 lb (120.203 kg)   General appearance:  Normal affect, orientation and appearance. Skin: Grossly normal excepting erythematous rash at the angle of her lips and in her nasolabial folds. HEENT: Without gross lesions.  No cervical or supraclavicular adenopathy. Thyroid normal.  Lungs:  Clear without wheezing, rales or rhonchi Cardiac: RR, without RMG Abdominal:  Soft, nontender, without masses, guarding, rebound, organomegaly or hernia Breasts:  Examined lying and sitting without masses, retractions, discharge or axillary adenopathy. Pelvic:  Ext/BUS/vagina normal with atrophic changes  Cervix normal with atrophic changes. Pap smear done  Uterus difficult to palpate but grossly normal size, nontender  Adnexa without gross masses or tenderness    Anus and perineum normal   Rectovaginal normal sphincter tone without palpated masses or tenderness.    Assessment/Plan:  65 y.o. G0P0000 female for annual exam.   1. Postmenopausal/atrophic genital changes. Without significant hot flushes, night sweats, vaginal dryness or any vaginal bleeding. Continue monitoring report any issues or vaginal bleeding. 2. Osteopenia. DEXA 2013 T score -1.5 FRAX 1%/0.6%. Repeat DEXA now and patient agrees to schedule. Check vitamin D level today.  History of vitamin D deficiency in the past 3. Skin rash on face. Started over the past several  weeks. No history of same before.  Questionable impetigo versus nonspecific dermatitis. Does not appear to be viral. Will treat with dicloxacillin 500 mg 4 times a day 7 days to cover impetigo and Diprolene 0.05% cream 2-3 times daily. If this persists/worsens she is going to call Dr. Ubaldo Glassing her dermatologist for evaluation. 4. Pap smear 2012. No Pap smear done today. No history of significant abnormal Pap smears previously. 5. Mammography 03/2015. Continue with annual mammography when due. SBE monthly reviewed. 6. Colonoscopy. I did not discuss this with her at the visit. My staff will call her and verify when she had one and if never then emphasize the need to schedule. 7. Health maintenance. Patient requests baseline labs. CBC, CMP, lipid profile, TSH, vitamin D, urinalysis ordered. Follow up in one year, sooner as needed.   Anastasio Auerbach MD, 11:22 AM 05/29/2015

## 2015-05-29 NOTE — Telephone Encounter (Signed)
Pt informed with the below note she will schedule.

## 2015-05-29 NOTE — Patient Instructions (Signed)
Take the antibiotics 4 times daily for a week. Apply the steroid cream to the rash several times daily. Call Dr. Ubaldo Glassing if the rash continues or worsens.  Schedule a screening colonoscopy if you have not had one or are overdue.  You may obtain a copy of any labs that were done today by logging onto MyChart as outlined in the instructions provided with your AVS (after visit summary). The office will not call with normal lab results but certainly if there are any significant abnormalities then we will contact you.   Health Maintenance Adopting a healthy lifestyle and getting preventive care can go a long way to promote health and wellness. Talk with your health care provider about what schedule of regular examinations is right for you. This is a good chance for you to check in with your provider about disease prevention and staying healthy. In between checkups, there are plenty of things you can do on your own. Experts have done a lot of research about which lifestyle changes and preventive measures are most likely to keep you healthy. Ask your health care provider for more information. WEIGHT AND DIET  Eat a healthy diet  Be sure to include plenty of vegetables, fruits, low-fat dairy products, and lean protein.  Do not eat a lot of foods high in solid fats, added sugars, or salt.  Get regular exercise. This is one of the most important things you can do for your health.  Most adults should exercise for at least 150 minutes each week. The exercise should increase your heart rate and make you sweat (moderate-intensity exercise).  Most adults should also do strengthening exercises at least twice a week. This is in addition to the moderate-intensity exercise.  Maintain a healthy weight  Body mass index (BMI) is a measurement that can be used to identify possible weight problems. It estimates body fat based on height and weight. Your health care provider can help determine your BMI and help you  achieve or maintain a healthy weight.  For females 55 years of age and older:   A BMI below 18.5 is considered underweight.  A BMI of 18.5 to 24.9 is normal.  A BMI of 25 to 29.9 is considered overweight.  A BMI of 30 and above is considered obese.  Watch levels of cholesterol and blood lipids  You should start having your blood tested for lipids and cholesterol at 65 years of age, then have this test every 5 years.  You may need to have your cholesterol levels checked more often if:  Your lipid or cholesterol levels are high.  You are older than 65 years of age.  You are at high risk for heart disease.  CANCER SCREENING   Lung Cancer  Lung cancer screening is recommended for adults 6-16 years old who are at high risk for lung cancer because of a history of smoking.  A yearly low-dose CT scan of the lungs is recommended for people who:  Currently smoke.  Have quit within the past 15 years.  Have at least a 30-pack-year history of smoking. A pack year is smoking an average of one pack of cigarettes a day for 1 year.  Yearly screening should continue until it has been 15 years since you quit.  Yearly screening should stop if you develop a health problem that would prevent you from having lung cancer treatment.  Breast Cancer  Practice breast self-awareness. This means understanding how your breasts normally appear and feel.  It  also means doing regular breast self-exams. Let your health care provider know about any changes, no matter how small.  If you are in your 20s or 30s, you should have a clinical breast exam (CBE) by a health care provider every 1-3 years as part of a regular health exam.  If you are 42 or older, have a CBE every year. Also consider having a breast X-ray (mammogram) every year.  If you have a family history of breast cancer, talk to your health care provider about genetic screening.  If you are at high risk for breast cancer, talk to your  health care provider about having an MRI and a mammogram every year.  Breast cancer gene (BRCA) assessment is recommended for women who have family members with BRCA-related cancers. BRCA-related cancers include:  Breast.  Ovarian.  Tubal.  Peritoneal cancers.  Results of the assessment will determine the need for genetic counseling and BRCA1 and BRCA2 testing. Cervical Cancer Routine pelvic examinations to screen for cervical cancer are no longer recommended for nonpregnant women who are considered low risk for cancer of the pelvic organs (ovaries, uterus, and vagina) and who do not have symptoms. A pelvic examination may be necessary if you have symptoms including those associated with pelvic infections. Ask your health care provider if a screening pelvic exam is right for you.   The Pap test is the screening test for cervical cancer for women who are considered at risk.  If you had a hysterectomy for a problem that was not cancer or a condition that could lead to cancer, then you no longer need Pap tests.  If you are older than 65 years, and you have had normal Pap tests for the past 10 years, you no longer need to have Pap tests.  If you have had past treatment for cervical cancer or a condition that could lead to cancer, you need Pap tests and screening for cancer for at least 20 years after your treatment.  If you no longer get a Pap test, assess your risk factors if they change (such as having a new sexual partner). This can affect whether you should start being screened again.  Some women have medical problems that increase their chance of getting cervical cancer. If this is the case for you, your health care provider may recommend more frequent screening and Pap tests.  The human papillomavirus (HPV) test is another test that may be used for cervical cancer screening. The HPV test looks for the virus that can cause cell changes in the cervix. The cells collected during the Pap  test can be tested for HPV.  The HPV test can be used to screen women 63 years of age and older. Getting tested for HPV can extend the interval between normal Pap tests from three to five years.  An HPV test also should be used to screen women of any age who have unclear Pap test results.  After 65 years of age, women should have HPV testing as often as Pap tests.  Colorectal Cancer  This type of cancer can be detected and often prevented.  Routine colorectal cancer screening usually begins at 65 years of age and continues through 65 years of age.  Your health care provider may recommend screening at an earlier age if you have risk factors for colon cancer.  Your health care provider may also recommend using home test kits to check for hidden blood in the stool.  A small camera at the end  of a tube can be used to examine your colon directly (sigmoidoscopy or colonoscopy). This is done to check for the earliest forms of colorectal cancer.  Routine screening usually begins at age 9.  Direct examination of the colon should be repeated every 5-10 years through 65 years of age. However, you may need to be screened more often if early forms of precancerous polyps or small growths are found. Skin Cancer  Check your skin from head to toe regularly.  Tell your health care provider about any new moles or changes in moles, especially if there is a change in a mole's shape or color.  Also tell your health care provider if you have a mole that is larger than the size of a pencil eraser.  Always use sunscreen. Apply sunscreen liberally and repeatedly throughout the day.  Protect yourself by wearing long sleeves, pants, a wide-brimmed hat, and sunglasses whenever you are outside. HEART DISEASE, DIABETES, AND HIGH BLOOD PRESSURE   Have your blood pressure checked at least every 1-2 years. High blood pressure causes heart disease and increases the risk of stroke.  If you are between 32 years  and 63 years old, ask your health care provider if you should take aspirin to prevent strokes.  Have regular diabetes screenings. This involves taking a blood sample to check your fasting blood sugar level.  If you are at a normal weight and have a low risk for diabetes, have this test once every three years after 65 years of age.  If you are overweight and have a high risk for diabetes, consider being tested at a younger age or more often. PREVENTING INFECTION  Hepatitis B  If you have a higher risk for hepatitis B, you should be screened for this virus. You are considered at high risk for hepatitis B if:  You were born in a country where hepatitis B is common. Ask your health care provider which countries are considered high risk.  Your parents were born in a high-risk country, and you have not been immunized against hepatitis B (hepatitis B vaccine).  You have HIV or AIDS.  You use needles to inject street drugs.  You live with someone who has hepatitis B.  You have had sex with someone who has hepatitis B.  You get hemodialysis treatment.  You take certain medicines for conditions, including cancer, organ transplantation, and autoimmune conditions. Hepatitis C  Blood testing is recommended for:  Everyone born from [birth year] through 1965.  Anyone with known risk factors for hepatitis C. Sexually transmitted infections (STIs)  You should be screened for sexually transmitted infections (STIs) including gonorrhea and chlamydia if:  You are sexually active and are younger than 65 years of age.  You are older than 65 years of age and your health care provider tells you that you are at risk for this type of infection.  Your sexual activity has changed since you were last screened and you are at an increased risk for chlamydia or gonorrhea. Ask your health care provider if you are at risk.  If you do not have HIV, but are at risk, it may be recommended that you take a prescription  medicine daily to prevent HIV infection. This is called pre-exposure prophylaxis (PrEP). You are considered at risk if:  You are sexually active and do not regularly use condoms or know the HIV status of your partner(s).  You take drugs by injection.  You are sexually active with a partner who has HIV.  Talk with your health care provider about whether you are at high risk of being infected with HIV. If you choose to begin PrEP, you should first be tested for HIV. You should then be tested every 3 months for as long as you are taking PrEP.  PREGNANCY   If you are premenopausal and you may become pregnant, ask your health care provider about preconception counseling.  If you may become pregnant, take 400 to 800 micrograms (mcg) of folic acid every day.  If you want to prevent pregnancy, talk to your health care provider about birth control (contraception). OSTEOPOROSIS AND MENOPAUSE   Osteoporosis is a disease in which the bones lose minerals and strength with aging. This can result in serious bone fractures. Your risk for osteoporosis can be identified using a bone density scan.  If you are 98 years of age or older, or if you are at risk for osteoporosis and fractures, ask your health care provider if you should be screened.  Ask your health care provider whether you should take a calcium or vitamin D supplement to lower your risk for osteoporosis.  Menopause may have certain physical symptoms and risks.  Hormone replacement therapy may reduce some of these symptoms and risks. Talk to your health care provider about whether hormone replacement therapy is right for you.  HOME CARE INSTRUCTIONS   Schedule regular health, dental, and eye exams.  Stay current with your immunizations.   Do not use any tobacco products including cigarettes, chewing tobacco, or electronic cigarettes.  If you are pregnant, do not drink alcohol.  If you are breastfeeding, limit how much and how often you  drink alcohol.  Limit alcohol intake to no more than 1 drink per day for nonpregnant women. One drink equals 12 ounces of beer, 5 ounces of wine, or 1 ounces of hard liquor.  Do not use street drugs.  Do not share needles.  Ask your health care provider for help if you need support or information about quitting drugs.  Tell your health care provider if you often feel depressed.  Tell your health care provider if you have ever been abused or do not feel safe at home. Document Released: 08/30/2010 Document Revised: 07/01/2013 Document Reviewed: 01/16/2013 [facility] Patient Information 2015 [place], [place]. This information is not intended to replace advice given to you by your health care provider. Make sure you discuss any questions you have with your health care provider.

## 2015-05-30 DIAGNOSIS — M8580 Other specified disorders of bone density and structure, unspecified site: Secondary | ICD-10-CM

## 2015-05-30 HISTORY — DX: Other specified disorders of bone density and structure, unspecified site: M85.80

## 2015-05-30 LAB — URINALYSIS W MICROSCOPIC + REFLEX CULTURE
BILIRUBIN URINE: NEGATIVE
Casts: NONE SEEN [LPF]
Crystals: NONE SEEN [HPF]
GLUCOSE, UA: NEGATIVE
Hgb urine dipstick: NEGATIVE
Ketones, ur: NEGATIVE
LEUKOCYTES UA: NEGATIVE
NITRITE: NEGATIVE
PH: 5.5 (ref 5.0–8.0)
PROTEIN: NEGATIVE
RBC / HPF: NONE SEEN RBC/HPF (ref <=2)
Specific Gravity, Urine: 1.023 (ref 1.001–1.035)
YEAST: NONE SEEN [HPF]

## 2015-05-30 LAB — VITAMIN D 25 HYDROXY (VIT D DEFICIENCY, FRACTURES): VIT D 25 HYDROXY: 26 ng/mL — AB (ref 30–100)

## 2015-06-01 ENCOUNTER — Other Ambulatory Visit: Payer: Self-pay | Admitting: Gynecology

## 2015-06-01 ENCOUNTER — Telehealth: Payer: Self-pay

## 2015-06-01 ENCOUNTER — Encounter: Payer: Self-pay | Admitting: Podiatry

## 2015-06-01 ENCOUNTER — Ambulatory Visit (INDEPENDENT_AMBULATORY_CARE_PROVIDER_SITE_OTHER): Payer: BC Managed Care – PPO | Admitting: Podiatry

## 2015-06-01 DIAGNOSIS — M21612 Bunion of left foot: Secondary | ICD-10-CM

## 2015-06-01 DIAGNOSIS — M2042 Other hammer toe(s) (acquired), left foot: Secondary | ICD-10-CM | POA: Diagnosis not present

## 2015-06-01 DIAGNOSIS — E782 Mixed hyperlipidemia: Secondary | ICD-10-CM

## 2015-06-01 DIAGNOSIS — E559 Vitamin D deficiency, unspecified: Secondary | ICD-10-CM

## 2015-06-01 DIAGNOSIS — L84 Corns and callosities: Secondary | ICD-10-CM

## 2015-06-01 LAB — PAP IG W/ RFLX HPV ASCU

## 2015-06-01 NOTE — Telephone Encounter (Signed)
-----   Message from Anastasio Auerbach, MD sent at 06/01/2015 10:29 AM EDT ----- Tell patient that her urine grew out bacteria. Recommend ciprofloxacin 250 mg twice a day 5 days

## 2015-06-01 NOTE — Telephone Encounter (Signed)
Patient questioned if she should take this antibiotic while she is still taking the Dicloxacillin that you prescribed at office visit?    She also asked me to thank you for treating her face condition. She said the Antibiotic has brought her relief and has "made a world of difference".

## 2015-06-02 ENCOUNTER — Other Ambulatory Visit: Payer: Self-pay | Admitting: Gynecology

## 2015-06-02 LAB — URINE CULTURE

## 2015-06-02 MED ORDER — CIPROFLOXACIN HCL 250 MG PO TABS: 250.0000 mg | ORAL_TABLET | Freq: Two times a day (BID) | ORAL | Status: DC

## 2015-06-02 NOTE — Telephone Encounter (Signed)
I would go ahead and start it. The bacteria that grew in the urine typically is not sensitive to penicillin type antibiotics.

## 2015-06-02 NOTE — Telephone Encounter (Signed)
Left detailed message in voice mail per DPR access note. 

## 2015-06-02 NOTE — Progress Notes (Signed)
Subjective:     Patient ID: Brooke [name], female   DOB: Jun 04, 1950, 65 y.o.   MRN: [id number]  HPI patient presents stating I'm getting closer to surgery on this left foot and I wanted to review and I need my calluses trim   Review of Systems     Objective:   Physical Exam Neurovascular status intact with severe structural forefoot deformity left with large bunion deformity plantarflexed metatarsals digital deformity and severe callus formation    Assessment:     Consistent structural deformity of the left foot with lesion formation    Plan:     H&P x-rays reviewed and today I did discuss again the-type surgery will be doing including the fusion procedure and the metatarsal osteotomies. Patient wants surgery and is can try to get it done as soon as possible and today deep debridement accomplished

## 2015-06-18 ENCOUNTER — Other Ambulatory Visit: Payer: Self-pay | Admitting: Gynecology

## 2015-06-18 ENCOUNTER — Ambulatory Visit (INDEPENDENT_AMBULATORY_CARE_PROVIDER_SITE_OTHER): Payer: BC Managed Care – PPO | Admitting: Family Medicine

## 2015-06-18 ENCOUNTER — Encounter: Payer: Self-pay | Admitting: Family Medicine

## 2015-06-18 ENCOUNTER — Ambulatory Visit (INDEPENDENT_AMBULATORY_CARE_PROVIDER_SITE_OTHER): Payer: BC Managed Care – PPO

## 2015-06-18 ENCOUNTER — Encounter: Payer: Self-pay | Admitting: Gynecology

## 2015-06-18 VITALS — BP 132/82 | HR 77 | Temp 98.3°F | Ht 69.0 in | Wt 260.8 lb

## 2015-06-18 DIAGNOSIS — R21 Rash and other nonspecific skin eruption: Secondary | ICD-10-CM

## 2015-06-18 DIAGNOSIS — M899 Disorder of bone, unspecified: Secondary | ICD-10-CM | POA: Diagnosis not present

## 2015-06-18 DIAGNOSIS — Z13820 Encounter for screening for osteoporosis: Secondary | ICD-10-CM | POA: Diagnosis not present

## 2015-06-18 DIAGNOSIS — M8580 Other specified disorders of bone density and structure, unspecified site: Secondary | ICD-10-CM

## 2015-06-18 NOTE — Patient Instructions (Addendum)
Before you leave: -Schedule a physical exam sometime in the next 2-3 months; please come fasting if  You can and we will plan to do labs that day  Please schedule a follow-up appointment with your dermatologist in the next 2-3 weeks  Please stop all facial products including makeup, soaps, cleansers and lotions.  Only use hypoallergenic products such as Cerave or Cetaphil facial cleanser, Aquaphor or  Cerave moisterizer and no makeup for now.  Use the steroid according to your dermatologist recommendations  Can try exceeding the steroid with the Aquaphor and an antifungal such as clotrimazole for the areas around the mouth.  Can try taking a Zyrtec nightly. This is an allergy medication available over-the-counter.

## 2015-06-18 NOTE — Progress Notes (Signed)
HPI:  Brooke [name] is a pleasant 65 year old with a past medical history of melanoma, followed by her dermatologist, hot flashes, seeing a gynecologist, whom has not seen [place] in several years, here for an acute visit for:  Facial rash: -Started 1 month ago -Rashes on bilateral eyelids, under where her glasses rest on bridge of nose, and bilateral mouth angle for his -Rash is somewhat itchy, and burning when she applies lotions or creams -She has seen her gynecologist, dermatologist and ophthalmologist for this and has tried doxycycline, dicloxacillin from her gynecologist, a steroid ointment from her dermatologist (however it irritates her skin when she applies, so has not been using) and Avenova from her ophthalmologist -Nothing has helped -Uses a complete facial regimen with wash, lotions, makeup - none are hypoallergenic -No sneezing, runny nose, sinus issues, difficulty breathing or new medications other than those listed above   ROS: See pertinent positives and negatives per HPI.  Past Medical History  Diagnosis Date  . Melanoma of upper arm ([place]) 2009    RIGHT ARM   . Osteopenia 09/2011    T score -1.5 FRAX  1% / 0.6%  . Vitamin deficiency 07/2008    VITAMIN D LOW 23  . Vitamin D deficiency 07/2009    LOW VITAMIN D 29  . Bilateral dry eyes   . Cancer ([place])   . Fibroid   . Arthritis   . UTI (urinary tract infection)   . Hyperlipidemia   . Kidney stones     Past Surgical History  Procedure Laterality Date  . Dermoid cyst  excision    . Hysteroscopy    . Cholecystectomy    . Skin cancer excision  2010,2011    2010-RIGHT ARM, 2011-LEFT LOWER LEG.-DR Rolm Bookbinder DERM. DR. Sarajane Jews IS HER DERM SURGEON.  . Skin biopsy    . Dilation and curettage of uterus  1998  . Gallbladder surgery    . Abdominal surgery  1975    ovairan cyst     Family History  Problem Relation Age of Onset  . Diabetes Father   . Hypertension Father   . Heart disease Father   . Prostate cancer  Father   . Breast cancer Sister     Age 80's  . Skin cancer Sister   . Colon cancer Paternal Grandfather   . Hypertension Mother     Social History   Social History  . Marital Status: Single    Spouse Name: N/A  . Number of Children: N/A  . Years of Education: N/A   Social History Main Topics  . Smoking status: Never Smoker   . Smokeless tobacco: Never Used  . Alcohol Use: Yes     Comment: rare  . Drug Use: No  . Sexual Activity: No   Other Topics Concern  . None   Social History Narrative     Current outpatient prescriptions:  .  alclomethasone (ACLOVATE) 0.05 % cream, APPLY TO ONLY THE AFFECTED AREA ON THE SKIN twice a day, Disp: , Rfl: 0 .  augmented betamethasone dipropionate (DIPROLENE AF) 0.05 % cream, Apply topically 2 (two) times daily., Disp: 30 g, Rfl: 0 .  CycloSPORINE (RESTASIS OP), Apply to eye as needed. , Disp: , Rfl:  .  PRESCRIPTION MEDICATION, Avenova eye solution, Disp: , Rfl:   EXAM:  Filed Vitals:   06/18/15 1014  BP: 132/82  Pulse: 77  Temp: 98.3 F (36.8 C)    Body mass index is 38.5 kg/(m^2).  GENERAL:  vitals reviewed and listed above, alert, oriented, appears well hydrated and in no acute distress  HEENT: atraumatic, conjunttiva clear, no obvious abnormalities on inspection of external nose and ears - See skin findings below  NECK: no obvious masses on inspection  LUNGS: clear to auscultation bilaterally, no wheezes, rales or rhonchi, good air movement  CV: HRRR, no peripheral edema  SKIN: Erythematous, mildly scaly plaques in the mouth angle for records, bilateral eyelids and in area under pads of glasses on the nose, no crusting, oozing or induration  MS: moves all extremities without noticeable abnormality  PSYCH: pleasant and cooperative, no obvious depression or anxiety  ASSESSMENT AND PLAN:  Discussed the following assessment and plan:  Facial rash  -Looks like eczematous reaction or contact dermatitis, possible  secondary yeast infection -Discussed hypoallergenic regimen for her face -Trial of using the steroid cream per dermatology recommendations -Topical yeast cream for the area around the mouth -Follow up with her dermatologist and ophthalmologist as needed if symptoms persist -Patient advised to return or notify a doctor immediately if symptoms worsen or persist or new concerns arise.  Patient Instructions  Before you leave: -Schedule a physical exam sometime in the next 2-3 months; please come fasting if  You can and we will plan to do labs that day  Please schedule a follow-up appointment with your dermatologist in the next 2-3 weeks  Please stop all facial products including makeup, soaps, cleansers and lotions.  Only use hypoallergenic products such as Cerave or Cetaphil facial cleanser, Aquaphor or  Cerave moisterizer and no makeup for now.  Use the steroid according to your dermatologist recommendations  Can try exceeding the steroid with the Aquaphor and an antifungal such as clotrimazole for the areas around the mouth.  Can try taking a Zyrtec nightly. This is an allergy medication available over-the-counter.     Colin Benton R.

## 2015-06-18 NOTE — Progress Notes (Signed)
Pre visit review using our clinic review tool, if applicable. No additional management support is needed unless otherwise documented below in the visit note. 

## 2015-07-13 ENCOUNTER — Other Ambulatory Visit: Payer: BC Managed Care – PPO

## 2015-07-16 ENCOUNTER — Encounter: Payer: Self-pay | Admitting: Podiatry

## 2015-07-16 ENCOUNTER — Ambulatory Visit (INDEPENDENT_AMBULATORY_CARE_PROVIDER_SITE_OTHER): Payer: BC Managed Care – PPO | Admitting: Podiatry

## 2015-07-16 DIAGNOSIS — M2042 Other hammer toe(s) (acquired), left foot: Secondary | ICD-10-CM

## 2015-07-16 DIAGNOSIS — L84 Corns and callosities: Secondary | ICD-10-CM | POA: Diagnosis not present

## 2015-07-16 DIAGNOSIS — M216X9 Other acquired deformities of unspecified foot: Secondary | ICD-10-CM

## 2015-07-16 DIAGNOSIS — M21612 Bunion of left foot: Secondary | ICD-10-CM

## 2015-07-16 NOTE — Progress Notes (Signed)
Subjective:     Patient ID: Brooke [name], female   DOB: [birth year], 65 y.o.   MRN: [id number]  HPI patient states she's ready to get the left foot fixed in June and she has severe foot structural deformity noted and she's extremely tired of the pain   Review of Systems     Objective:   Physical Exam Neurovascular status intact muscle strength adequate with patient found to have severe keratotic lesion subsecond third metatarsals left with severe forefoot instability and structural changes    Assessment:     Severe forefoot deformity with metatarsal protrusion plantarly and lesion formation    Plan:     Debride lesions with no iatrogenic bleeding and reappoint to recheck

## 2015-07-17 ENCOUNTER — Ambulatory Visit: Payer: BC Managed Care – PPO | Admitting: Podiatry

## 2015-07-17 ENCOUNTER — Telehealth: Payer: Self-pay | Admitting: *Deleted

## 2015-07-17 NOTE — Telephone Encounter (Signed)
"  I'm a patient of Dr. Paulla Dolly. I do have voicemail if I can't pick up the phone when you call.  I can return a call.  Thank you so much."

## 2015-07-21 NOTE — Telephone Encounter (Signed)
I left patient a message that I was returning her call.  Please give me a call back.

## 2015-07-21 NOTE — Telephone Encounter (Signed)
"  I'm calling you regarding a surgery I'm supposed to be having with Dr. Paulla Dolly.  He was going to consult with another doctor and have him assist him with the surgery.  I am not sure what I'm supposed to be doing.  I'm a little confused.  I had spoken to him and I think he mentioned doing it on May 27.  I'm a little concerned because I live alone.  I'm not sure I will have anyone to help me.  I'll go ahead and keep it as is for now.  If I need to reschedule it, do I do it myself?"  No, I'll take care of getting it rescheduled if need be.  I was aware of the date.  Dr. Paulla Dolly is going to have you back in to sign consent forms.  You will receive the information about the surgical center during that visit.  "So is there anything I needed to do with you?"  No, not really, I already had your date.  I will take care of it.  "Well thank you so much for listening to me carry on and on."

## 2015-08-06 ENCOUNTER — Encounter: Payer: Self-pay | Admitting: Podiatry

## 2015-08-06 ENCOUNTER — Ambulatory Visit (INDEPENDENT_AMBULATORY_CARE_PROVIDER_SITE_OTHER): Payer: BC Managed Care – PPO

## 2015-08-06 ENCOUNTER — Ambulatory Visit (INDEPENDENT_AMBULATORY_CARE_PROVIDER_SITE_OTHER): Payer: BC Managed Care – PPO | Admitting: Podiatry

## 2015-08-06 ENCOUNTER — Telehealth: Payer: Self-pay | Admitting: *Deleted

## 2015-08-06 VITALS — BP 170/95 | HR 81 | Resp 16

## 2015-08-06 DIAGNOSIS — M216X9 Other acquired deformities of unspecified foot: Secondary | ICD-10-CM | POA: Diagnosis not present

## 2015-08-06 DIAGNOSIS — M2042 Other hammer toe(s) (acquired), left foot: Secondary | ICD-10-CM | POA: Diagnosis not present

## 2015-08-06 DIAGNOSIS — M79672 Pain in left foot: Secondary | ICD-10-CM

## 2015-08-06 DIAGNOSIS — L84 Corns and callosities: Secondary | ICD-10-CM | POA: Diagnosis not present

## 2015-08-06 DIAGNOSIS — E58 Dietary calcium deficiency: Secondary | ICD-10-CM

## 2015-08-06 DIAGNOSIS — M21612 Bunion of left foot: Secondary | ICD-10-CM | POA: Diagnosis not present

## 2015-08-06 NOTE — Progress Notes (Signed)
Subjective:     Patient ID: Brooke [name], female   DOB: 02-Mar-1950, 65 y.o.   MRN: [id number]  HPI patient presents for correction of her left foot that she is doing at the end of the month. She has severe deformity that has been ongoing and we have tried specific trimming techniques padding techniques orthotics with minimal relief of symptoms   Review of Systems     Objective:   Physical Exam Neurovascular status is found to be intact muscle strength is adequate range of motion within normal limits with patient found to have severe bunion deformity left with a hallux that's under lapping the second and third digits with elevation of the second third digits with severe keratotic lesion subsecond third metatarsals left that are chronic in nature and failed to respond to numerous conservative treatments    Assessment:     Severe forefoot derangement left    Plan:     H&P and x-ray reviewed with this patient at great length. At this point I have recommended a Lapidus type fusion with shortening along with possible first metatarsal phalangeal fusion metatarsal head resection 234 and 5 and digital fusion digits 23 left foot. I spent a great of time going over the procedure with patient and reviewed with her all alternative treatments and complications associated with this. Patient wants procedure understanding all risk and understands recovery takes approximate one year and there is no long-term guarantees that the calluses will go away. She signs consent form after extensive review and is scheduled for outpatient surgery and is encouraged to call us with any questions     X-ray report indicates severe bunion deformity left with an under lapping hallux and severe rigid contracture digits 23 left with the second and third metatarsophalangeal joint being completely dislocated

## 2015-08-06 NOTE — Telephone Encounter (Addendum)
Dr. Paulla Dolly ordered Vitamin D - 25 hydroxy. I informed pt of orders and suggested she have them drawn as soon as possible at [facility] lab. 08/07/2015-Dr. Regal reviewed 08/06/2015 Vitamin D results and recommended OTC vitamin D suppliment.  Left message with orders.

## 2015-08-06 NOTE — Patient Instructions (Signed)
Pre-Operative Instructions  Congratulations, you have decided to take an important step to improving your quality of life.  You can be assured that the doctors of [facility] will be with you every step of the way.  1. Plan to be at the surgery center/hospital at least 1 (one) hour prior to your scheduled time unless otherwise directed by the surgical center/hospital staff.  You must have a responsible adult accompany you, remain during the surgery and drive you home.  Make sure you have directions to the surgical center/hospital and know how to get there on time. 2. For hospital based surgery you will need to obtain a history and physical form from your family physician within 1 month prior to the date of surgery- we will give you a form for you primary physician.  3. We make every effort to accommodate the date you request for surgery.  There are however, times where surgery dates or times have to be moved.  We will contact you as soon as possible if a change in schedule is required.   4. No Aspirin/Ibuprofen for one week before surgery.  If you are on aspirin, any non-steroidal anti-inflammatory medications (Mobic, Aleve, Ibuprofen) you should stop taking it 7 days prior to your surgery.  You make take Tylenol  For pain prior to surgery.  5. Medications- If you are taking daily heart and blood pressure medications, seizure, reflux, allergy, asthma, anxiety, pain or diabetes medications, make sure the surgery center/hospital is aware before the day of surgery so they may notify you which medications to take or avoid the day of surgery. 6. No food or drink after midnight the night before surgery unless directed otherwise by surgical center/hospital staff. 7. No alcoholic beverages 24 hours prior to surgery.  No smoking 24 hours prior to or 24 hours after surgery. 8. Wear loose pants or shorts- loose enough to fit over bandages, boots, and casts. 9. No slip on shoes, sneakers are best. 10. Bring  your boot with you to the surgery center/hospital.  Also bring crutches or a walker if your physician has prescribed it for you.  If you do not have this equipment, it will be provided for you after surgery. 11. If you have not been contracted by the surgery center/hospital by the day before your surgery, call to confirm the date and time of your surgery. 12. Leave-time from work may vary depending on the type of surgery you have.  Appropriate arrangements should be made prior to surgery with your employer. 13. Prescriptions will be provided immediately following surgery by your doctor.  Have these filled as soon as possible after surgery and take the medication as directed. 14. Remove nail polish on the operative foot. 15. Wash the night before surgery.  The night before surgery wash the foot and leg well with the antibacterial soap provided and water paying special attention to beneath the toenails and in between the toes.  Rinse thoroughly with water and dry well with a towel.  Perform this wash unless told not to do so by your physician.  Enclosed: 1 Ice pack (please put in freezer the night before surgery)   1 Hibiclens skin cleaner   Pre-op Instructions  If you have any questions regarding the instructions, do not hesitate to call our office.  [place]: [address] [place], [place] [address] [phone]  [place]: [address] [place], [place] [address] [phone]  [place]: [address]  [place], [place] [address] [phone]   Dr.   Norman Regal DPM, Dr. Matthew Wagoner DPM, Dr. M. Todd Hyatt DPM, Dr. Titorya Stover DPM 

## 2015-08-07 LAB — VITAMIN D 25 HYDROXY (VIT D DEFICIENCY, FRACTURES): Vit D, 25-Hydroxy: 34 ng/mL (ref 30–100)

## 2015-08-12 ENCOUNTER — Telehealth: Payer: Self-pay | Admitting: *Deleted

## 2015-08-12 NOTE — Telephone Encounter (Signed)
"  I'm going to have to cancel my surgery.  I don't think I can have everything taken care of by June 27.  I'm going to schedule an appointment with Dr. Paulla Dolly to come in and ask him some questions."  I'll cancel the surgery.  However, I can take a message of your questions and give to Dr. Paulla Dolly when he comes back to the office instead of you coming in to see him.  "That's okay, I don't mind paying to come back in to see him.  The questions I need to ask him will help determine what will be a good day to reschedule my surgery.  I know he has another doctor assisting him so I figure this will be best to reschedule it later.   Will I need to do anything as far as the surgery center?"  No, I will call and let surgical center know.  Would you like me to send you back to schedulers for an appointment?  "Yes, that would be great."  I called Caren Griffins at [facility] and canceled patient's surgery.

## 2015-08-18 NOTE — Telephone Encounter (Signed)
Make sure she has appointment to see me

## 2015-08-19 ENCOUNTER — Ambulatory Visit (INDEPENDENT_AMBULATORY_CARE_PROVIDER_SITE_OTHER): Payer: BC Managed Care – PPO | Admitting: Podiatry

## 2015-08-19 DIAGNOSIS — L84 Corns and callosities: Secondary | ICD-10-CM

## 2015-08-19 DIAGNOSIS — M21612 Bunion of left foot: Secondary | ICD-10-CM

## 2015-08-19 DIAGNOSIS — M79672 Pain in left foot: Secondary | ICD-10-CM | POA: Diagnosis not present

## 2015-08-19 DIAGNOSIS — M2042 Other hammer toe(s) (acquired), left foot: Secondary | ICD-10-CM | POA: Diagnosis not present

## 2015-08-19 NOTE — Progress Notes (Signed)
Subjective:     Patient ID: Brooke [name], female   DOB: 1951-02-18, 65 y.o.   MRN: [id number]  HPI patient presents stating that she is desperate to get the left foot fixed but doesn't think she can do it next week and we'll have to wait for later on in the year   Review of Systems     Objective:   Physical Exam Neurovascular status intact muscle strength adequate with severe structural foot deformity left over right that we'll require correction with severe plantar keratotic lesions    Assessment:     Problems secondary to foot structure    Plan:     Advised on surgery and we discussed again different treatment options and we will continue debridement and surgery is tenably scheduled for the fall

## 2015-08-31 ENCOUNTER — Encounter: Payer: Self-pay | Admitting: Podiatry

## 2015-09-04 ENCOUNTER — Other Ambulatory Visit: Payer: Self-pay

## 2015-09-08 ENCOUNTER — Ambulatory Visit (INDEPENDENT_AMBULATORY_CARE_PROVIDER_SITE_OTHER): Payer: BC Managed Care – PPO | Admitting: Family Medicine

## 2015-09-08 ENCOUNTER — Encounter: Payer: Self-pay | Admitting: Family Medicine

## 2015-09-08 VITALS — BP 124/80 | HR 75 | Temp 98.4°F | Ht 69.0 in | Wt 263.0 lb

## 2015-09-08 DIAGNOSIS — L723 Sebaceous cyst: Secondary | ICD-10-CM | POA: Diagnosis not present

## 2015-09-08 MED ORDER — DOXYCYCLINE HYCLATE 100 MG PO CAPS: 100.0000 mg | ORAL_CAPSULE | Freq: Two times a day (BID) | ORAL | Status: DC

## 2015-09-08 NOTE — Patient Instructions (Signed)
Warm compresses 2-3 times daily  Topical steroid once daily  Doxycycline per instructions. Avoid sun exposure.  Follow up if worsening, not resolving as expected, other concerns.  If you desire removal can see your dermatologist once inflammation has resolved.

## 2015-09-08 NOTE — Progress Notes (Signed)
Pre visit review using our clinic review tool, if applicable. No additional management support is needed unless otherwise documented below in the visit note. 

## 2015-09-08 NOTE — Progress Notes (Signed)
HPI:  Cyst: -L lateral flank -small bump here chronically, became red once in the past -red, tender and swollen the last 4 days -no fevers, drainage, rapid spreading  ROS: See pertinent positives and negatives per HPI.  Past Medical History  Diagnosis Date  . Melanoma of upper arm ([place]) 2009    RIGHT ARM   . Osteopenia 05/2015    T score -1.3 FRAX 6%/0.5% stable from prior DEXA  . Vitamin deficiency 07/2008    VITAMIN D LOW 23  . Vitamin D deficiency 07/2009    LOW VITAMIN D 29  . Bilateral dry eyes   . Cancer ([place])   . Fibroid   . Arthritis   . UTI (urinary tract infection)   . Hyperlipidemia   . Kidney stones     Past Surgical History  Procedure Laterality Date  . Dermoid cyst  excision    . Hysteroscopy    . Cholecystectomy    . Skin cancer excision  2010,2011    2010-RIGHT ARM, 2011-LEFT LOWER LEG.-DR Rolm Bookbinder DERM. DR. Sarajane Jews IS HER DERM SURGEON.  . Skin biopsy    . Dilation and curettage of uterus  1998  . Gallbladder surgery    . Abdominal surgery  1975    ovairan cyst     Family History  Problem Relation Age of Onset  . Diabetes Father   . Hypertension Father   . Heart disease Father   . Prostate cancer Father   . Breast cancer Sister     Age 67's  . Skin cancer Sister   . Colon cancer Paternal Grandfather   . Hypertension Mother     Social History   Social History  . Marital Status: Single    Spouse Name: N/A  . Number of Children: N/A  . Years of Education: N/A   Social History Main Topics  . Smoking status: Never Smoker   . Smokeless tobacco: Never Used  . Alcohol Use: Yes     Comment: rare  . Drug Use: No  . Sexual Activity: No   Other Topics Concern  . None   Social History Narrative     Current outpatient prescriptions:  .  alclomethasone (ACLOVATE) 0.05 % cream, APPLY TO ONLY THE AFFECTED AREA ON THE SKIN PRN, Disp: , Rfl: 0 .  cetirizine (ZYRTEC) 10 MG tablet, Take 10 mg by mouth as needed. , Disp: , Rfl:  .   doxycycline (VIBRAMYCIN) 100 MG capsule, Take 1 capsule (100 mg total) by mouth 2 (two) times daily., Disp: 10 capsule, Rfl: 0  EXAM:  Filed Vitals:   09/08/15 0930  BP: 124/80  Pulse: 75  Temp: 98.4 F (36.9 C)    Body mass index is 38.82 kg/(m^2).  GENERAL: vitals reviewed and listed above, alert, oriented, appears well hydrated and in no acute distress  HEENT: atraumatic, conjunttiva clear, no obvious abnormalities on inspection of external nose and ears  NECK: no obvious masses on inspection  SKIN: small subcut nodule L flank (1cm) with some surrounding erythema and induration about the size of a tangerine  MS: moves all extremities without noticeable abnormality  PSYCH: pleasant and cooperative, no obvious depression or anxiety  ASSESSMENT AND PLAN:  Discussed the following assessment and plan:  Inflamed sebaceous cyst  -suspect inflamed and not infected cyst -discussed tx options including I/D -opted for compresses, topical steroid, short course doxy incase mild cellulitis and for antiinflammatory properties after discussion risks with return precautions -she may see derm for  removal once inflammation resolved -Patient advised to return or notify a doctor immediately if symptoms worsen or persist or new concerns arise.  Patient Instructions  Warm compresses 2-3 times daily  Topical steroid once daily  Doxycycline per instructions. Avoid sun exposure.  Follow up if worsening, not resolving as expected, other concerns.  If you desire removal can see your dermatologist once inflammation has resolved.    Colin Benton R., DO

## 2015-09-16 ENCOUNTER — Ambulatory Visit: Payer: BC Managed Care – PPO | Admitting: Podiatry

## 2015-09-25 ENCOUNTER — Ambulatory Visit: Payer: BC Managed Care – PPO | Admitting: Podiatry

## 2015-09-28 ENCOUNTER — Encounter: Payer: Self-pay | Admitting: Podiatry

## 2015-09-28 ENCOUNTER — Ambulatory Visit (INDEPENDENT_AMBULATORY_CARE_PROVIDER_SITE_OTHER): Payer: BC Managed Care – PPO | Admitting: Podiatry

## 2015-09-28 DIAGNOSIS — M2042 Other hammer toe(s) (acquired), left foot: Secondary | ICD-10-CM

## 2015-09-28 DIAGNOSIS — M216X9 Other acquired deformities of unspecified foot: Secondary | ICD-10-CM

## 2015-09-28 DIAGNOSIS — L84 Corns and callosities: Secondary | ICD-10-CM | POA: Diagnosis not present

## 2015-09-28 DIAGNOSIS — M779 Enthesopathy, unspecified: Secondary | ICD-10-CM | POA: Diagnosis not present

## 2015-09-28 NOTE — Progress Notes (Signed)
Subjective:     Patient ID: Brooke [name], female   DOB: Apr 02, 1950, 65 y.o.   MRN: [id number]  HPI patient presents stating I have these terrible calluses in the bottom my left foot and also on my right foot I know that I'll need surgery as my big toe seems to be getting worse   Review of Systems     Objective:   Physical Exam Neurovascular status intact negative Homan sign was noted and good digital perfusion noted. Patient's noted to have severe structural malalignment of the left foot and moderate deformity on the right foot with severe metatarsal extrusion left. patient has keratotic lesions that are quite tender    Assessment:     Severe structural deformity left over right foot with bunion to be getting on the right foot with hammertoe deformity and lesion formation left secondary to structure    Plan:     H&P and all conditions reviewed and today I debrided lesions on the left foot with no iatrogenic bleeding. I did go ahead and scanned for new orthotic to reduce all plantar pressure and I discussed long-term correction of this left foot and the possibility for the right foot to be done in the future

## 2015-10-28 ENCOUNTER — Ambulatory Visit: Payer: BC Managed Care – PPO | Admitting: *Deleted

## 2015-10-28 DIAGNOSIS — M779 Enthesopathy, unspecified: Secondary | ICD-10-CM

## 2015-10-28 NOTE — Progress Notes (Signed)
Patient ID: Brooke [name], female   DOB: 1950-09-16, 65 y.o.   MRN: [id number]  Patient presents for orthotic pick up.  Verbal and written break in and wear instructions given.  Patient will follow up in 4 weeks if symptoms worsen or fail to improve.

## 2015-10-28 NOTE — Patient Instructions (Signed)

## 2015-11-09 ENCOUNTER — Encounter: Payer: Self-pay | Admitting: Podiatry

## 2015-11-09 ENCOUNTER — Ambulatory Visit (INDEPENDENT_AMBULATORY_CARE_PROVIDER_SITE_OTHER): Payer: BC Managed Care – PPO | Admitting: Podiatry

## 2015-11-09 DIAGNOSIS — L84 Corns and callosities: Secondary | ICD-10-CM

## 2015-11-11 NOTE — Progress Notes (Signed)
Subjective:     Patient ID: Brooke [name], female   DOB: 06-Nov-1950, 65 y.o.   MRN: [id number]  HPI patient presents with lesions left knowing she needs to get her foot fixed   Review of Systems     Objective:   Physical Exam Neurovascular status intact with severe deformity of the left forefoot lesion formation with severe digital deformity and structural bunion deformity    Assessment:     Severe forefoot imbalance    Plan:     Debrided lesions left and discussed long-term surgical intervention

## 2015-12-21 ENCOUNTER — Ambulatory Visit: Payer: BC Managed Care – PPO | Admitting: Podiatry

## 2016-01-11 ENCOUNTER — Ambulatory Visit (INDEPENDENT_AMBULATORY_CARE_PROVIDER_SITE_OTHER): Payer: BC Managed Care – PPO | Admitting: Podiatry

## 2016-01-11 ENCOUNTER — Encounter: Payer: Self-pay | Admitting: Podiatry

## 2016-01-11 DIAGNOSIS — M779 Enthesopathy, unspecified: Secondary | ICD-10-CM | POA: Diagnosis not present

## 2016-01-11 DIAGNOSIS — L84 Corns and callosities: Secondary | ICD-10-CM

## 2016-01-11 DIAGNOSIS — M216X9 Other acquired deformities of unspecified foot: Secondary | ICD-10-CM | POA: Diagnosis not present

## 2016-01-11 DIAGNOSIS — M21612 Bunion of left foot: Secondary | ICD-10-CM

## 2016-01-11 NOTE — Progress Notes (Signed)
Subjective:     Patient ID: Brooke [name], female   DOB: [birth year], 65 y.o.   MRN: [id number]  HPI patient presents with multiple lesions bilateral left that are very tender and severe foot structural changes left over right   Review of Systems     Objective:   Physical Exam Neurovascular status intact with severe foot structural changes left over right with deep keratotic lesion subsecond third metatarsal left which are very painful when pressed with digital abnormalities and severe structural bunion deformity    Assessment:     Severe foot structure left over right foot with keratotic lesions deep around the metatarsals    Plan:     H&P conditions reviewed and deep debridement accomplished which was tolerated well. Discussed surgery which we'll need to be done some day and she will continue to work with her schedule and we will figure out a time to do this for her

## 2016-03-02 HISTORY — PX: OTHER SURGICAL HISTORY: SHX169

## 2016-03-14 ENCOUNTER — Encounter: Payer: Self-pay | Admitting: Podiatry

## 2016-03-14 ENCOUNTER — Ambulatory Visit (INDEPENDENT_AMBULATORY_CARE_PROVIDER_SITE_OTHER): Payer: BC Managed Care – PPO | Admitting: Podiatry

## 2016-03-14 DIAGNOSIS — M216X9 Other acquired deformities of unspecified foot: Secondary | ICD-10-CM

## 2016-03-14 DIAGNOSIS — M21612 Bunion of left foot: Secondary | ICD-10-CM

## 2016-03-14 DIAGNOSIS — L84 Corns and callosities: Secondary | ICD-10-CM

## 2016-03-20 NOTE — Progress Notes (Signed)
Subjective:     Patient ID: Brooke [name], female   DOB: 1950-03-08, 66 y.o.   MRN: [id number]  HPI patient presents with severe thickness of lesion second third metatarsal left with severe foot deformity   Review of Systems     Objective:   Physical Exam Neurovascular status unchanged with severe keratotic lesions plantar left    Assessment:     Chronic lesions left    Plan:     Debride lesion no iatrogenic bleeding noted

## 2016-05-16 ENCOUNTER — Ambulatory Visit (INDEPENDENT_AMBULATORY_CARE_PROVIDER_SITE_OTHER): Payer: BC Managed Care – PPO | Admitting: Podiatry

## 2016-05-16 ENCOUNTER — Encounter: Payer: Self-pay | Admitting: Podiatry

## 2016-05-16 DIAGNOSIS — L84 Corns and callosities: Secondary | ICD-10-CM

## 2016-05-16 DIAGNOSIS — M21612 Bunion of left foot: Secondary | ICD-10-CM

## 2016-05-16 NOTE — Progress Notes (Signed)
Subjective:     Patient ID: Brooke [name], female   DOB: 26-Jan-1951, 66 y.o.   MRN: [id number]  HPI patient presents with lesion formation subsecond third metatarsal left with prominent bone with severe bunion deformity left over right with concerns about the right   Review of Systems     Objective:   Physical Exam Neurovascular status intact negative Homans sign was noted with patient's left foot exhibiting severe keratotic tissue subsecond third metatarsals due to advanced foot structural deformity with mild structural deformity noted on the right    Assessment:     Plantar lesion secondary to bone structure and structural HAV left over right    Plan:     She had numerous questions today concerning surgery and I do think ultimately will be necessary and she also questioned me today concerning her right foot. I did go ahead today and I debrided the lesions left and I discussed orthotics at next visit to try to make as soft as possible and she will reappoint for that and also she knows surgery is can to be inevitable for this condition

## 2016-05-23 ENCOUNTER — Ambulatory Visit (INDEPENDENT_AMBULATORY_CARE_PROVIDER_SITE_OTHER): Payer: BC Managed Care – PPO | Admitting: Family Medicine

## 2016-05-23 ENCOUNTER — Encounter: Payer: Self-pay | Admitting: Family Medicine

## 2016-05-23 VITALS — BP 122/84 | HR 83 | Temp 98.2°F | Ht 69.0 in | Wt 262.5 lb

## 2016-05-23 DIAGNOSIS — E785 Hyperlipidemia, unspecified: Secondary | ICD-10-CM | POA: Diagnosis not present

## 2016-05-23 DIAGNOSIS — Z23 Encounter for immunization: Secondary | ICD-10-CM

## 2016-05-23 DIAGNOSIS — R030 Elevated blood-pressure reading, without diagnosis of hypertension: Secondary | ICD-10-CM

## 2016-05-23 DIAGNOSIS — E6609 Other obesity due to excess calories: Secondary | ICD-10-CM

## 2016-05-23 DIAGNOSIS — Z6838 Body mass index (BMI) 38.0-38.9, adult: Secondary | ICD-10-CM | POA: Diagnosis not present

## 2016-05-23 DIAGNOSIS — IMO0001 Reserved for inherently not codable concepts without codable children: Secondary | ICD-10-CM | POA: Insufficient documentation

## 2016-05-23 NOTE — Progress Notes (Signed)
Pre visit review using our clinic review tool, if applicable. No additional management support is needed unless otherwise documented below in the visit note. 

## 2016-05-23 NOTE — Patient Instructions (Addendum)
BEFORE YOU LEAVE: -follow up: 1 month, 30 minute appt at end of clinic  Keep food journal of everything you eat and drink  Cut out sweet tea and replace with water if possible   We recommend the following healthy lifestyle for LIFE: 1) Small portions.   Tip: eat off of a salad plate instead of a dinner plate.  Tip: It is ok to feel hungry after a meal of proper portion sizes  Tip: if you need more or a snack choose fruits, veggies and/or a handful of nuts or seeds.  2) Eat a healthy clean diet.  * Tip: Avoid (less then 1 serving per week): processed foods, sweets, sweetened drinks, white starches (rice, flour, bread, potatoes, pasta, etc), red meat, fast foods, butter  *Tip: CHOOSE instead   * 5-9 servings per day of fresh or frozen fruits and vegetables (but not corn, potatoes, bananas, canned or dried fruit)   *nuts and seeds, beans   *olives and olive oil, canola oil   *small portions of lean meats such as fish and white chicken    *small portions of whole grains   *small portions of unsweet low fat or nonfat dairy products  3)Get at least 150 minutes of sweaty aerobic exercise per week.  4)Reduce stress - consider counseling, meditation and relaxation to balance other aspects of your life.

## 2016-05-23 NOTE — Progress Notes (Signed)
HPI:  Acute visit for elevated BP. At eye appointment. Knows needs to loose weight. Poor diet and no regular exercise due to foot issues (seeing podiatry.) No CP, SOB, DOE, HAs. Has mild chronic LE edema. Drinks sweet tea, eats a lot of sugar and fat. She loves butter. Wants to try to improved diet and start exercising.  ROS: See pertinent positives and negatives per HPI.  Past Medical History:  Diagnosis Date  . Arthritis   . Bilateral dry eyes   . Cancer ([place])   . Fibroid   . Hyperlipidemia   . Kidney stones   . Melanoma of upper arm ([place]) 2009   RIGHT ARM   . Osteopenia 05/2015   T score -1.3 FRAX 6%/0.5% stable from prior DEXA  . UTI (urinary tract infection)   . Vitamin D deficiency 07/2009   LOW VITAMIN D 29  . Vitamin deficiency 07/2008   VITAMIN D LOW 23    Past Surgical History:  Procedure Laterality Date  . ABDOMINAL SURGERY  1975   ovairan cyst   . CHOLECYSTECTOMY    . DERMOID CYST  EXCISION    . DILATION AND CURETTAGE OF UTERUS  1998  . GALLBLADDER SURGERY    . HYSTEROSCOPY    . SKIN BIOPSY    . SKIN CANCER EXCISION  2010,2011   2010-RIGHT ARM, 2011-LEFT LOWER LEG.-DR Rolm Bookbinder DERM. DR. Sarajane Jews IS HER DERM SURGEON.    Family History  Problem Relation Age of Onset  . Diabetes Father   . Hypertension Father   . Heart disease Father   . Prostate cancer Father   . Breast cancer Sister     Age 88's  . Skin cancer Sister   . Colon cancer Paternal Grandfather   . Hypertension Mother     Social History   Social History  . Marital status: Single    Spouse name: N/A  . Number of children: N/A  . Years of education: N/A   Social History Main Topics  . Smoking status: Never Smoker  . Smokeless tobacco: Never Used  . Alcohol use Yes     Comment: rare  . Drug use: No  . Sexual activity: No   Other Topics Concern  . None   Social History Narrative  . None     Current Outpatient Prescriptions:  .  alclomethasone (ACLOVATE) 0.05 % cream,  APPLY TO ONLY THE AFFECTED AREA ON THE SKIN PRN, Disp: , Rfl: 0 .  XIIDRA 5 % SOLN, Place 1 drop into both eyes daily. , Disp: , Rfl: 1  EXAM:  Vitals:   05/23/16 1443  BP: 122/84  Pulse: 83  Temp: 98.2 F (36.8 C)    Body mass index is 38.76 kg/m.  GENERAL: vitals reviewed and listed above, alert, oriented, appears well hydrated and in no acute distress  HEENT: atraumatic, conjunttiva clear, no obvious abnormalities on inspection of external nose and ears  NECK: no obvious masses on inspection  LUNGS: clear to auscultation bilaterally, no wheezes, rales or rhonchi, good air movement  CV: HRRR, no peripheral edema  MS: moves all extremities without noticeable abnormality  PSYCH: pleasant and cooperative, no obvious depression or anxiety  ASSESSMENT AND PLAN:  More than 50% of over 40 minutes spent in total in caring for this patient was spent face-to-face with the patient, counseling and/or coordinating care.   Discussed the following assessment and plan:  Elevated blood pressure reading  Hyperlipidemia, unspecified hyperlipidemia type  Class 2 obesity due  to excess calories with serious comorbidity and body mass index (BMI) of 38.0 to 38.9 in adult  -discussed implications of poor diet, sedentary lifestyle and obesity and risks associated with continued poor diet and lack of activity -discussed potential dietary changes and potential exercise/activity options she could do despite the foot issues at length -discussed colon cancer screening options - she decided to do cologard - assistant advised to order -she agrees to prenar 13 -follow up in 1 month with diet journal and for further help with turning around her lifestyle, will need labs -reports she see gyn for women's health -Patient advised to return or notify a doctor immediately if symptoms worsen or persist or new concerns arise.  Patient Instructions  BEFORE YOU LEAVE: -follow up: 1 month, 30 minute appt at  end of clinic  Keep food journal of everything you eat and drink  Cut out sweet tea and replace with water if possible   We recommend the following healthy lifestyle for LIFE: 1) Small portions.   Tip: eat off of a salad plate instead of a dinner plate.  Tip: It is ok to feel hungry after a meal of proper portion sizes  Tip: if you need more or a snack choose fruits, veggies and/or a handful of nuts or seeds.  2) Eat a healthy clean diet.  * Tip: Avoid (less then 1 serving per week): processed foods, sweets, sweetened drinks, white starches (rice, flour, bread, potatoes, pasta, etc), red meat, fast foods, butter  *Tip: CHOOSE instead   * 5-9 servings per day of fresh or frozen fruits and vegetables (but not corn, potatoes, bananas, canned or dried fruit)   *nuts and seeds, beans   *olives and olive oil, canola oil   *small portions of lean meats such as fish and white chicken    *small portions of whole grains   *small portions of unsweet low fat or nonfat dairy products  3)Get at least 150 minutes of sweaty aerobic exercise per week.  4)Reduce stress - consider counseling, meditation and relaxation to balance other aspects of your life.     Colin Benton R., DO

## 2016-05-23 NOTE — Addendum Note (Signed)
Addended by: Agnes Lawrence on: 05/23/2016 03:35 PM   Modules accepted: Orders

## 2016-06-23 ENCOUNTER — Encounter: Payer: Self-pay | Admitting: Family Medicine

## 2016-06-23 ENCOUNTER — Ambulatory Visit (INDEPENDENT_AMBULATORY_CARE_PROVIDER_SITE_OTHER): Payer: BC Managed Care – PPO | Admitting: Family Medicine

## 2016-06-23 VITALS — BP 122/70 | HR 87 | Temp 98.4°F | Ht 69.0 in | Wt 263.0 lb

## 2016-06-23 DIAGNOSIS — Z9189 Other specified personal risk factors, not elsewhere classified: Secondary | ICD-10-CM

## 2016-06-23 DIAGNOSIS — Z6838 Body mass index (BMI) 38.0-38.9, adult: Secondary | ICD-10-CM

## 2016-06-23 DIAGNOSIS — E6609 Other obesity due to excess calories: Secondary | ICD-10-CM | POA: Diagnosis not present

## 2016-06-23 DIAGNOSIS — IMO0001 Reserved for inherently not codable concepts without codable children: Secondary | ICD-10-CM

## 2016-06-23 DIAGNOSIS — E785 Hyperlipidemia, unspecified: Secondary | ICD-10-CM | POA: Diagnosis not present

## 2016-06-23 NOTE — Patient Instructions (Addendum)
BEFORE YOU LEAVE: -follow up: 30 minute appointment in 2 weeks with diet journal AND exercise journal  Diet Goals for next visit: -no more then one small sweetened beverage per day - replace with water, unsweetened seltzer or water with lemon -at least 4 veggies and 2 fruits per day - fresh or frozen (potatoes, corn and bananas do not count) -replace unhealthy snacks (potatoe chips, pudding, candy, cookies, etc) with healthy snacks - furit and nuts, humus and veggies, etc  You can do this!

## 2016-06-23 NOTE — Progress Notes (Signed)
HPI:  Brooke [name] is a very pleasant 66 yo with a PMH significant for Obesity, HLD, elevated BP here for help with changing her current poor lifestyle. Today the plan was to focus on her diet. She brought a very honest diet journal. She drinks 2-5 sweetened beverages per day, eats mainly fast food, processed foods and junk food, eats very little vegetables and is not eating much protein. She actually likes vegetables, humus, peanut better, fish, chicken, fruits - but also loves sweets and dairy.She struggles with the thought of giving up these comfort foods, but know she needs to. Wants to loose weight, improve energy, low blood pressure and cholesterol.  ROS: See pertinent positives and negatives per HPI.  Past Medical History:  Diagnosis Date  . Arthritis   . Bilateral dry eyes   . Cancer ([place])   . Fibroid   . Hyperlipidemia   . Kidney stones   . Melanoma of upper arm ([place]) 2009   RIGHT ARM   . Osteopenia 05/2015   T score -1.3 FRAX 6%/0.5% stable from prior DEXA  . UTI (urinary tract infection)   . Vitamin D deficiency 07/2009   LOW VITAMIN D 29  . Vitamin deficiency 07/2008   VITAMIN D LOW 23    Past Surgical History:  Procedure Laterality Date  . ABDOMINAL SURGERY  1975   ovairan cyst   . CHOLECYSTECTOMY    . DERMOID CYST  EXCISION    . DILATION AND CURETTAGE OF UTERUS  1998  . GALLBLADDER SURGERY    . HYSTEROSCOPY    . SKIN BIOPSY    . SKIN CANCER EXCISION  2010,2011   2010-RIGHT ARM, 2011-LEFT LOWER LEG.-DR Rolm Bookbinder DERM. DR. Sarajane Jews IS HER DERM SURGEON.    Family History  Problem Relation Age of Onset  . Diabetes Father   . Hypertension Father   . Heart disease Father   . Prostate cancer Father   . Breast cancer Sister     Age 54's  . Skin cancer Sister   . Colon cancer Paternal Grandfather   . Hypertension Mother     Social History   Social History  . Marital status: Single    Spouse name: N/A  . Number of children: N/A  . Years of education:  N/A   Social History Main Topics  . Smoking status: Never Smoker  . Smokeless tobacco: Never Used  . Alcohol use Yes     Comment: rare  . Drug use: No  . Sexual activity: No   Other Topics Concern  . None   Social History Narrative  . None     Current Outpatient Prescriptions:  .  alclomethasone (ACLOVATE) 0.05 % cream, APPLY TO ONLY THE AFFECTED AREA ON THE SKIN PRN, Disp: , Rfl: 0 .  XIIDRA 5 % SOLN, Place 1 drop into both eyes daily. , Disp: , Rfl: 1  EXAM:  Vitals:   06/23/16 1445  BP: 122/70  Pulse: 87  Temp: 98.4 F (36.9 C)    Body mass index is 38.84 kg/m.  GENERAL: vitals reviewed and listed above, alert, oriented, appears well hydrated and in no acute distress  HEENT: atraumatic, conjunttiva clear, no obvious abnormalities on inspection of external nose and ears  NECK: no obvious masses on inspection  LUNGS: clear to auscultation bilaterally, no wheezes, rales or rhonchi, good air movement  CV: HRRR, no peripheral edema  MS: moves all extremities without noticeable abnormality  PSYCH: pleasant and cooperative, no obvious depression  or anxiety  ASSESSMENT AND PLAN:  Discussed the following assessment and plan:  Class 2 obesity due to excess calories with serious comorbidity and body mass index (BMI) of 38.0 to 38.9 in adult  Hyperlipidemia, unspecified hyperlipidemia type  Has poorly balanced diet  Sedentary lifestyle  More than 50% of over 40 minutes spent in total in caring for this patient was spent face-to-face with the patient, counseling and/or coordinating care.   -reviewed her diet in detail - see scanned diet in media tab in EPIC, discussed pros and cons of various foods, ways to improve nutritional value, reduce calories and sweets, bad fats and good fats, quick options for healthier choices -we set several goals - see patient instructions: decreased sweetened beverage consumption, increased veggies to 4 per day, swapping out some  unhealthy snacks with healthier choices that she likes -she wants to follow up in 2 weeks as she feels she will be better able to make the changes with more frequent visits -Patient advised to return or notify a doctor immediately if symptoms worsen or persist or new concerns arise.  Patient Instructions  BEFORE YOU LEAVE: -follow up: 30 minute appointment in 2 weeks with diet journal AND exercise journal  Diet Goals for next visit: -no more then one small sweetened beverage per day - replace with water, unsweetened seltzer or water with lemon -at least 4 veggies and 2 fruits per day - fresh or frozen (potatoes, corn and bananas do not count) -replace unhealthy snacks (potatoe chips, pudding, candy, cookies, etc) with healthy snacks - furit and nuts, humus and veggies, etc  You can do this!    Colin Benton R., DO

## 2016-06-23 NOTE — Progress Notes (Signed)
Pre visit review using our clinic review tool, if applicable. No additional management support is needed unless otherwise documented below in the visit note. 

## 2016-07-14 ENCOUNTER — Ambulatory Visit (INDEPENDENT_AMBULATORY_CARE_PROVIDER_SITE_OTHER): Payer: BC Managed Care – PPO | Admitting: Family Medicine

## 2016-07-14 ENCOUNTER — Encounter: Payer: Self-pay | Admitting: Family Medicine

## 2016-07-14 VITALS — BP 130/70 | HR 78 | Temp 98.1°F | Ht 69.0 in | Wt 260.6 lb

## 2016-07-14 DIAGNOSIS — Z6838 Body mass index (BMI) 38.0-38.9, adult: Secondary | ICD-10-CM | POA: Diagnosis not present

## 2016-07-14 DIAGNOSIS — IMO0001 Reserved for inherently not codable concepts without codable children: Secondary | ICD-10-CM

## 2016-07-14 DIAGNOSIS — E6609 Other obesity due to excess calories: Secondary | ICD-10-CM | POA: Diagnosis not present

## 2016-07-14 NOTE — Patient Instructions (Signed)
BEFORE YOU LEAVE: -follow up: 1 month  5-9 servings of fresh or frozen vegetables (and/or fruits) per day. (carrots, spinach, cucumbers, broccoli, celery, apple, orange, berries, red or purple grapes, etc) More veggies then fruits. Not canned, dried or processed.  Good luck!

## 2016-07-14 NOTE — Progress Notes (Signed)
HPI:  Follow up obesity, hyperlipidemia, poor diet. Detailed diet report scanned. Some improvement in reduction potato chips and sodas - still with significant 1-2 sweetened beverages per day (reports "I really feel chocolate milk is healthy and I need this"), lots of junk food/processed foods instead of meals and a paucity of vegetables, fruits - though improved from last check. She is reading a book on intermittent fasting and has been trying this. Trying more water. Not exercising. No CP, SOB, DOE.  ROS: See pertinent positives and negatives per HPI.  Past Medical History:  Diagnosis Date  . Arthritis   . Bilateral dry eyes   . Cancer ([place])   . Fibroid   . Hyperlipidemia   . Kidney stones   . Melanoma of upper arm ([place]) 2009   RIGHT ARM   . Osteopenia 05/2015   T score -1.3 FRAX 6%/0.5% stable from prior DEXA  . UTI (urinary tract infection)   . Vitamin D deficiency 07/2009   LOW VITAMIN D 29  . Vitamin deficiency 07/2008   VITAMIN D LOW 23    Past Surgical History:  Procedure Laterality Date  . ABDOMINAL SURGERY  1975   ovairan cyst   . CHOLECYSTECTOMY    . DERMOID CYST  EXCISION    . DILATION AND CURETTAGE OF UTERUS  1998  . GALLBLADDER SURGERY    . HYSTEROSCOPY    . SKIN BIOPSY    . SKIN CANCER EXCISION  2010,2011   2010-RIGHT ARM, 2011-LEFT LOWER LEG.-DR Rolm Bookbinder DERM. DR. Sarajane Jews IS HER DERM SURGEON.    Family History  Problem Relation Age of Onset  . Diabetes Father   . Hypertension Father   . Heart disease Father   . Prostate cancer Father   . Breast cancer Sister        Age 17's  . Skin cancer Sister   . Colon cancer Paternal Grandfather   . Hypertension Mother     Social History   Social History  . Marital status: Single    Spouse name: N/A  . Number of children: N/A  . Years of education: N/A   Social History Main Topics  . Smoking status: Never Smoker  . Smokeless tobacco: Never Used  . Alcohol use Yes     Comment: rare  . Drug use:  No  . Sexual activity: No   Other Topics Concern  . None   Social History Narrative  . None     Current Outpatient Prescriptions:  .  alclomethasone (ACLOVATE) 0.05 % cream, APPLY TO ONLY THE AFFECTED AREA ON THE SKIN PRN, Disp: , Rfl: 0 .  XIIDRA 5 % SOLN, Place 1 drop into both eyes daily. , Disp: , Rfl: 1  EXAM:  Vitals:   07/14/16 1415  BP: 130/70  Pulse: 78  Temp: 98.1 F (36.7 C)    Body mass index is 38.48 kg/m.  GENERAL: vitals reviewed and listed above, alert, oriented, appears well hydrated and in no acute distress  HEENT: atraumatic, conjunttiva clear, no obvious abnormalities on inspection of external nose and ears  NECK: no obvious masses on inspection  LUNGS: clear to auscultation bilaterally, no wheezes, rales or rhonchi, good air movement  CV: HRRR, no peripheral edema  MS: moves all extremities without noticeable abnormality  PSYCH: pleasant and cooperative, no obvious depression or anxiety  ASSESSMENT AND PLAN:  Discussed the following assessment and plan:  Class 2 obesity due to excess calories with serious comorbidity and body mass index (  BMI) of 38.0 to 38.9 in adult More than 50% of over 25  minutes spent in total in caring for this patient was spent face-to-face with the patient, counseling and/or coordinating care.   -lost 3 lbs -reviewed diet journal at length and am hopeful she can continue to make small changes - really has a sweet tooth and like junk food and is not used to eating whole foods -goal to increase veggies/fruits to 5-9 per day to replace some of the processed foods and try to limit beverages other then water a bit more -advised increasing exercise -she wants to follow up in 1 month -Patient advised to return or notify a doctor immediately if symptoms worsen or persist or new concerns arise.  Patient Instructions  BEFORE YOU LEAVE: -follow up: 1 month  5-9 servings of fresh or frozen vegetables (and/or fruits) per  day. (carrots, spinach, cucumbers, broccoli, celery, apple, orange, berries, red or purple grapes, etc) More veggies then fruits. Not canned, dried or processed.  Good luck!    Colin Benton R., DO

## 2016-07-27 ENCOUNTER — Ambulatory Visit (INDEPENDENT_AMBULATORY_CARE_PROVIDER_SITE_OTHER): Payer: BC Managed Care – PPO | Admitting: Podiatry

## 2016-07-27 DIAGNOSIS — M779 Enthesopathy, unspecified: Secondary | ICD-10-CM

## 2016-07-27 DIAGNOSIS — M216X9 Other acquired deformities of unspecified foot: Secondary | ICD-10-CM | POA: Diagnosis not present

## 2016-07-27 DIAGNOSIS — M21612 Bunion of left foot: Secondary | ICD-10-CM

## 2016-07-27 DIAGNOSIS — L84 Corns and callosities: Secondary | ICD-10-CM | POA: Diagnosis not present

## 2016-07-27 NOTE — Progress Notes (Signed)
Subjective:    Patient ID: Brooke [name], female   DOB: 66 y.o.   MRN: [id number]   HPI patient presents with severe callus formation left with severe structural malalignment of both feet secondary to hereditary conditions. Left foot isn't much worse condition    ROS      Objective:  Physical Exam Neurovascular status intact with patient found to have severe foot structural condition left with prominent keratotic lesion [id number] left with inflamed capsule and orthotics which are starting to degrade and breakdown    Assessment:      Chronic lesion formation left secondary to foot structure with prominent bone structure and inflammatory capsulitis    Plan:    Consulted with pedal with this concerning this condition and I've recommended a softer type orthotic and we are both in agreement to try to reduce pressure against his metatarsals. Patient is casted for a new type orthotic to try to reduce pressure and today debridement was accomplished

## 2016-08-01 ENCOUNTER — Ambulatory Visit: Payer: BC Managed Care – PPO | Admitting: Orthotics

## 2016-08-03 NOTE — Progress Notes (Signed)
HPI:  Obesity/Hyperlipidemia: -working intensively on her diet - see scanned food log which we reviewed at length -wt 260 --> 254 since last visit -still lots of sugar - daily candy and sweetened beverages, making progress on drinking more water and increasing fruits and veggies -wonders if drinking a V8 would balance out drinking the chocolate milk  ROS: See pertinent positives and negatives per HPI.  Past Medical History:  Diagnosis Date  . Arthritis   . Bilateral dry eyes   . Cancer ([place])   . Fibroid   . Hyperlipidemia   . Kidney stones   . Melanoma of upper arm ([place]) 2009   RIGHT ARM   . Osteopenia 05/2015   T score -1.3 FRAX 6%/0.5% stable from prior DEXA  . UTI (urinary tract infection)   . Vitamin D deficiency 07/2009   LOW VITAMIN D 29  . Vitamin deficiency 07/2008   VITAMIN D LOW 23    Past Surgical History:  Procedure Laterality Date  . ABDOMINAL SURGERY  1975   ovairan cyst   . CHOLECYSTECTOMY    . DERMOID CYST  EXCISION    . DILATION AND CURETTAGE OF UTERUS  1998  . GALLBLADDER SURGERY    . HYSTEROSCOPY    . SKIN BIOPSY    . SKIN CANCER EXCISION  2010,2011   2010-RIGHT ARM, 2011-LEFT LOWER LEG.-DR Rolm Bookbinder DERM. DR. Sarajane Jews IS HER DERM SURGEON.    Family History  Problem Relation Age of Onset  . Diabetes Father   . Hypertension Father   . Heart disease Father   . Prostate cancer Father   . Breast cancer Sister        Age 21's  . Skin cancer Sister   . Colon cancer Paternal Grandfather   . Hypertension Mother     Social History   Social History  . Marital status: Single    Spouse name: N/A  . Number of children: N/A  . Years of education: N/A   Social History Main Topics  . Smoking status: Never Smoker  . Smokeless tobacco: Never Used  . Alcohol use Yes     Comment: rare  . Drug use: No  . Sexual activity: No   Other Topics Concern  . None   Social History Narrative  . None     Current Outpatient Prescriptions:  .   alclomethasone (ACLOVATE) 0.05 % cream, APPLY TO ONLY THE AFFECTED AREA ON THE SKIN PRN, Disp: , Rfl: 0 .  XIIDRA 5 % SOLN, Place 1 drop into both eyes daily. , Disp: , Rfl: 1  EXAM:  Vitals:   08/04/16 1426  BP: 118/82  Pulse: 73  Temp: 97.9 F (36.6 C)    Body mass index is 37.51 kg/m.  GENERAL: vitals reviewed and listed above, alert, oriented, appears well hydrated and in no acute distress  HEENT: atraumatic, conjunttiva clear, no obvious abnormalities on inspection of external nose and ears  NECK: no obvious masses on inspection  LUNGS: clear to auscultation bilaterally, no wheezes, rales or rhonchi, good air movement  CV: HRRR, no peripheral edema  MS: moves all extremities without noticeable abnormality  PSYCH: pleasant and cooperative, no obvious depression or anxiety  ASSESSMENT AND PLAN:  Discussed the following assessment and plan:  BMI 37.0-37.9, adult  -extensive and detailed review of her diet -she still eats lot of sugar, is improving on drinking more water and increasing vegetables and not all processed foods now -goal to further reduce foods with added  sugar - made recommendation to remove entirely, but she does  not feels she can yet -increase whole foods and lean proteins/veggies and 100% whole grains -start tracking exercise -she feels follow up is really helping and plans to return in 3-4 weeks -Patient advised to return or notify a doctor immediately if symptoms worsen or persist or new concerns arise.  Patient Instructions  BEFORE YOU LEAVE: -follow up: 3-4 weeks  Cut out sweets. If you can.... switch to water in place of all sweetened beverages.  Increase vegetables and fruits to 5-9 servings per day.  Keep a log of exercise with your food log.    Colin Benton R., DO

## 2016-08-04 ENCOUNTER — Encounter: Payer: Self-pay | Admitting: Family Medicine

## 2016-08-04 ENCOUNTER — Ambulatory Visit (INDEPENDENT_AMBULATORY_CARE_PROVIDER_SITE_OTHER): Payer: BC Managed Care – PPO | Admitting: Family Medicine

## 2016-08-04 VITALS — BP 118/82 | HR 73 | Temp 97.9°F | Ht 69.0 in | Wt 254.0 lb

## 2016-08-04 DIAGNOSIS — Z6837 Body mass index (BMI) 37.0-37.9, adult: Secondary | ICD-10-CM

## 2016-08-04 NOTE — Patient Instructions (Signed)
BEFORE YOU LEAVE: -follow up: 3-4 weeks  Cut out sweets. If you can.... switch to water in place of all sweetened beverages.  Increase vegetables and fruits to 5-9 servings per day.  Keep a log of exercise with your food log.

## 2016-08-15 ENCOUNTER — Ambulatory Visit: Payer: BC Managed Care – PPO | Admitting: Orthotics

## 2016-08-24 NOTE — Progress Notes (Signed)
HPI:  Follow up Obesity/Hyperlipidemia: -wt last visit (263 05/2015) --> 254 --> 247! -initially had a very poor diet, very little nutritional value and very high sugar/starch diet and had a poor understanding of a healthy diet -she was interested in trying a healthier diet and feels seeing me on a regular basis has really helped  -has been bringing food log every several weeks -she has not been able to make dramatic changes and finds each step in the right direction challenging, but has kept improving and loosing wt at each visit. -current log shows significant reduction in sweetened beverages! -she is eating more veggies and protein - though still not on a regular basis -still eating some junk food and candy but improving -also she has started exercising - is doing home workout with small 3lb weight, chair and pretend cycling - reports does get sweaty and gets heart rate up and has a goal of 1 hour 6 days per week   ROS: See pertinent positives and negatives per HPI.  Past Medical History:  Diagnosis Date  . Arthritis   . Bilateral dry eyes   . Cancer ([place])   . Fibroid   . Hyperlipidemia   . Kidney stones   . Melanoma of upper arm ([place]) 2009   RIGHT ARM   . Osteopenia 05/2015   T score -1.3 FRAX 6%/0.5% stable from prior DEXA  . UTI (urinary tract infection)   . Vitamin D deficiency 07/2009   LOW VITAMIN D 29  . Vitamin deficiency 07/2008   VITAMIN D LOW 23    Past Surgical History:  Procedure Laterality Date  . ABDOMINAL SURGERY  1975   ovairan cyst   . CHOLECYSTECTOMY    . DERMOID CYST  EXCISION    . DILATION AND CURETTAGE OF UTERUS  1998  . GALLBLADDER SURGERY    . HYSTEROSCOPY    . SKIN BIOPSY    . SKIN CANCER EXCISION  2010,2011   2010-RIGHT ARM, 2011-LEFT LOWER LEG.-DR Rolm Bookbinder DERM. DR. Sarajane Jews IS HER DERM SURGEON.    Family History  Problem Relation Age of Onset  . Diabetes Father   . Hypertension Father   . Heart disease Father   . Prostate cancer  Father   . Breast cancer Sister        Age 55's  . Skin cancer Sister   . Colon cancer Paternal Grandfather   . Hypertension Mother     Social History   Social History  . Marital status: Single    Spouse name: N/A  . Number of children: N/A  . Years of education: N/A   Social History Main Topics  . Smoking status: Never Smoker  . Smokeless tobacco: Never Used  . Alcohol use Yes     Comment: rare  . Drug use: No  . Sexual activity: No   Other Topics Concern  . None   Social History Narrative  . None     Current Outpatient Prescriptions:  .  alclomethasone (ACLOVATE) 0.05 % cream, APPLY TO ONLY THE AFFECTED AREA ON THE SKIN PRN, Disp: , Rfl: 0 .  XIIDRA 5 % SOLN, Place 1 drop into both eyes daily. , Disp: , Rfl: 1  EXAM:  Vitals:   08/25/16 1418  BP: 120/80  Pulse: 73  Temp: 97.9 F (36.6 C)    Body mass index is 36.58 kg/m.  GENERAL: vitals reviewed and listed above, alert, oriented, appears well hydrated and in no acute distress  HEENT: atraumatic,  conjunttiva clear, no obvious abnormalities on inspection of external nose and ears  NECK: no obvious masses on inspection  LUNGS: clear to auscultation bilaterally, no wheezes, rales or rhonchi, good air movement  CV: HRRR, no peripheral edema  MS: moves all extremities without noticeable abnormality  PSYCH: pleasant and cooperative, no obvious depression or anxiety  ASSESSMENT AND PLAN:  Discussed the following assessment and plan:  BMI 36.0-36.9,adult  Hyperlipidemia, unspecified hyperlipidemia type  -reviewed and counseled on food log - see pt instructions and scanned sheet -she still has a relative paucity and healthy veggies and proteins - though is improving! -really am trying to encourage her to eat regular healthy meals rather then starving herself and then eating junk -she is making progress, feels better and continues to loose wt -will need labs next visit -she requests to follow up in  3-4 weeks -Patient advised to return or notify a doctor immediately if symptoms worsen or persist or new concerns arise.  Patient Instructions  BEFORE YOU LEAVE: -follow up: 3-4 weeks  Congratulations on your progress! Continue exercise and keep log!  Make sure diet log includes the weekends.  Eat at least 3 healthy meals every day!  Avoid sweets.  Change to unsweetened whole grain cereal if doing cereal. Cheerios (ensure proper serving size) with walnuts and berries and unsweetened alternative milk.  Increase veggies to at least 5-6 servings per day.  If eating fruit choose berries.  Increase protein to 3 meals per day - lean meats (chicken breast, fish, tofu, beans, etc)  If eating breads or starches ensure they are 100% whole grain.    Colin Benton R., DO

## 2016-08-25 ENCOUNTER — Ambulatory Visit (INDEPENDENT_AMBULATORY_CARE_PROVIDER_SITE_OTHER): Payer: BC Managed Care – PPO | Admitting: Family Medicine

## 2016-08-25 ENCOUNTER — Encounter: Payer: Self-pay | Admitting: Family Medicine

## 2016-08-25 VITALS — BP 120/80 | HR 73 | Temp 97.9°F | Ht 69.0 in | Wt 247.7 lb

## 2016-08-25 DIAGNOSIS — E785 Hyperlipidemia, unspecified: Secondary | ICD-10-CM

## 2016-08-25 DIAGNOSIS — Z6836 Body mass index (BMI) 36.0-36.9, adult: Secondary | ICD-10-CM | POA: Diagnosis not present

## 2016-08-25 NOTE — Patient Instructions (Addendum)
BEFORE YOU LEAVE: -follow up: 3-4 weeks  Congratulations on your progress! Continue exercise and keep log!  Make sure diet log includes the weekends.  Eat at least 3 healthy meals every day!  Avoid sweets.  Change to unsweetened whole grain cereal if doing cereal. Cheerios (ensure proper serving size) with walnuts and berries and unsweetened alternative milk.  Increase veggies to at least 5-6 servings per day.  If eating fruit choose berries.  Increase protein to 3 meals per day - lean meats (chicken breast, fish, tofu, beans, etc)  If eating breads or starches ensure they are 100% whole grain.

## 2016-08-29 ENCOUNTER — Ambulatory Visit: Payer: BC Managed Care – PPO | Admitting: Orthotics

## 2016-09-07 ENCOUNTER — Ambulatory Visit: Payer: BC Managed Care – PPO | Admitting: *Deleted

## 2016-09-07 DIAGNOSIS — M779 Enthesopathy, unspecified: Secondary | ICD-10-CM

## 2016-09-07 NOTE — Progress Notes (Signed)
Patient ID: Brooke [name], female   DOB: 01-26-1951, 66 y.o.   MRN: [id number]   Patient presents stating that she does not like the new orthotics they are not what was discussed.  Patient request they be sent back and made like the set from last year.  Orthotics sent back to [facility] to with patients requests.  We will call patient when they return.

## 2016-09-12 ENCOUNTER — Other Ambulatory Visit: Payer: BC Managed Care – PPO | Admitting: Orthotics

## 2016-09-15 ENCOUNTER — Ambulatory Visit (INDEPENDENT_AMBULATORY_CARE_PROVIDER_SITE_OTHER): Payer: BC Managed Care – PPO | Admitting: Family Medicine

## 2016-09-15 ENCOUNTER — Encounter: Payer: Self-pay | Admitting: Family Medicine

## 2016-09-15 ENCOUNTER — Other Ambulatory Visit: Payer: BC Managed Care – PPO | Admitting: Orthotics

## 2016-09-15 VITALS — BP 120/70 | HR 74 | Temp 98.2°F | Ht 69.0 in | Wt 249.0 lb

## 2016-09-15 DIAGNOSIS — E785 Hyperlipidemia, unspecified: Secondary | ICD-10-CM | POA: Diagnosis not present

## 2016-09-15 DIAGNOSIS — Z6836 Body mass index (BMI) 36.0-36.9, adult: Secondary | ICD-10-CM | POA: Diagnosis not present

## 2016-09-15 DIAGNOSIS — Z729 Problem related to lifestyle, unspecified: Secondary | ICD-10-CM | POA: Diagnosis not present

## 2016-09-15 DIAGNOSIS — Z9189 Other specified personal risk factors, not elsewhere classified: Secondary | ICD-10-CM

## 2016-09-15 NOTE — Progress Notes (Signed)
HPI:  Follow up Obesity, hyperlipidemia, poor diet and sedentary lifestyle: -has made significant changes and this has had dramatic impact on weight -wt l (263 05/2015) --> last visit 247 --> 249 -goals last visit: diet log weekends too, continue to decrease unhealthy foods and sweets, change to unsweetened whole gran ceral, 3 healthy meals per day and add exercise log -she is upset as "knows why" she gained weight -has not been doing 3 meals daily, gets gravings then eats chocolate cake or very large portion of sweetened cereal -drinking water, exercising 30-60 minutes 4 days per week -see scanne dlong  ROS: See pertinent positives and negatives per HPI.  Past Medical History:  Diagnosis Date  . Arthritis   . Bilateral dry eyes   . Cancer ([place])   . Fibroid   . Hyperlipidemia   . Kidney stones   . Melanoma of upper arm ([place]) 2009   RIGHT ARM   . Osteopenia 05/2015   T score -1.3 FRAX 6%/0.5% stable from prior DEXA  . UTI (urinary tract infection)   . Vitamin D deficiency 07/2009   LOW VITAMIN D 29  . Vitamin deficiency 07/2008   VITAMIN D LOW 23    Past Surgical History:  Procedure Laterality Date  . ABDOMINAL SURGERY  1975   ovairan cyst   . CHOLECYSTECTOMY    . DERMOID CYST  EXCISION    . DILATION AND CURETTAGE OF UTERUS  1998  . GALLBLADDER SURGERY    . HYSTEROSCOPY    . SKIN BIOPSY    . SKIN CANCER EXCISION  2010,2011   2010-RIGHT ARM, 2011-LEFT LOWER LEG.-DR Rolm Bookbinder DERM. DR. Sarajane Jews IS HER DERM SURGEON.    Family History  Problem Relation Age of Onset  . Diabetes Father   . Hypertension Father   . Heart disease Father   . Prostate cancer Father   . Breast cancer Sister        Age 31's  . Skin cancer Sister   . Colon cancer Paternal Grandfather   . Hypertension Mother     Social History   Social History  . Marital status: Single    Spouse name: N/A  . Number of children: N/A  . Years of education: N/A   Social History Main Topics  . Smoking  status: Never Smoker  . Smokeless tobacco: Never Used  . Alcohol use Yes     Comment: rare  . Drug use: No  . Sexual activity: No   Other Topics Concern  . None   Social History Narrative  . None     Current Outpatient Prescriptions:  .  alclomethasone (ACLOVATE) 0.05 % cream, APPLY TO ONLY THE AFFECTED AREA ON THE SKIN PRN, Disp: , Rfl: 0 .  XIIDRA 5 % SOLN, Place 1 drop into both eyes daily. , Disp: , Rfl: 1  EXAM:  Vitals:   09/15/16 1427  BP: 120/70  Pulse: 74  Temp: 98.2 F (36.8 C)    Body mass index is 36.77 kg/m.  GENERAL: vitals reviewed and listed above, alert, oriented, appears well hydrated and in no acute distress  HEENT: atraumatic, conjunttiva clear, no obvious abnormalities on inspection of external nose and ears  NECK: no obvious masses on inspection  LUNGS: clear to auscultation bilaterally, no wheezes, rales or rhonchi, good air movement  CV: HRRR, no peripheral edema  MS: moves all extremities without noticeable abnormality  PSYCH: pleasant and cooperative, no obvious depression or anxiety  ASSESSMENT AND PLAN:  Discussed  the following assessment and plan: More than 50% of over 30 minutes spent in total in caring for this patient was spent face-to-face with the patient, counseling and/or coordinating care.   BMI 36.0-36.9,adult  Hyperlipidemia, unspecified hyperlipidemia type  Has poorly balanced diet  Lifestyle problems  -congratulated on exercise, continued journal, that she knows why wt went up, continues to avoid sweetened beverages for the most part, IS getting one healthy meal per day most days -reviewed journal with her -discussed barriers to success and discussed potential solutions -she continues to rarely get more then one meal per day, makes up with unhealthy snacks or drinks - she is not getting enough protein or healthy foods and unfortunately has allowed more unhealthy foods to creep back in, continues to eat sweetened  cereals -new goals set: 1) have healthy snacks prepared to eat when has cravings for unhealthy foods 2) set up a routine of eat 3 healthy meals per day - discussed many simple and easy ways to do this 3) discussed ways to address cravings for sweet, eating protein snack she likes, berries, nuts, getting mind off food by doing something else, recognizing when she feels this was and having plan in place ahead of time for how to address 4) exercise log -she requests to return in 3 weeks  Patient Instructions  BEFORE YOU LEAVE: -follow up: 3 weeks  3 healthy meals per day.  Have healthy snacks on hand to eat when you crave something unhealthy - nuts, seeds, unsweet peanut butter on celery or 100% whole grain cracker, etc  Eat breakfast every morning: un sweet cheerios with nuts, berries, alternative milk.  Salad with grilled chicken or lunch meat, lots of veggies, nuts or seeds and healthy dressing for lunch - can be a lot of veggies and a portion of meat. Every day!  Healthy dinner every night.  Exercise log.    Colin Benton R., DO

## 2016-09-15 NOTE — Patient Instructions (Signed)
BEFORE YOU LEAVE: -follow up: 3 weeks  3 healthy meals per day.  Have healthy snacks on hand to eat when you crave something unhealthy - nuts, seeds, unsweet peanut butter on celery or 100% whole grain cracker, etc  Eat breakfast every morning: un sweet cheerios with nuts, berries, alternative milk.  Salad with grilled chicken or lunch meat, lots of veggies, nuts or seeds and healthy dressing for lunch - can be a lot of veggies and a portion of meat. Every day!  Healthy dinner every night.  Exercise log.

## 2016-09-21 ENCOUNTER — Ambulatory Visit (INDEPENDENT_AMBULATORY_CARE_PROVIDER_SITE_OTHER): Payer: BC Managed Care – PPO | Admitting: Podiatry

## 2016-09-21 ENCOUNTER — Other Ambulatory Visit: Payer: BC Managed Care – PPO | Admitting: Orthotics

## 2016-09-21 DIAGNOSIS — M779 Enthesopathy, unspecified: Secondary | ICD-10-CM | POA: Diagnosis not present

## 2016-09-21 DIAGNOSIS — L84 Corns and callosities: Secondary | ICD-10-CM | POA: Diagnosis not present

## 2016-09-21 NOTE — Patient Instructions (Signed)

## 2016-09-21 NOTE — Progress Notes (Signed)
Subjective:    Patient ID: Brooke [name], female   DOB: 66 y.o.   MRN: [id number]   HPI patient presents with severe lesions plantar aspect left foot with foot structural issues    ROS      Objective:  Physical Exam neurovascular status unchanged with severe keratotic lesion sub-second third metatarsal left with significant foot deformity     Assessment:    Chronic lesions with structural changes     Plan:    Debridement of lesions today 1-5 both feet with no iatrogenic bleeding noted

## 2016-09-29 ENCOUNTER — Other Ambulatory Visit: Payer: BC Managed Care – PPO | Admitting: Orthotics

## 2016-10-05 NOTE — Progress Notes (Signed)
She HPI:  Follow up:  Obesity, hyperlipidemia: -wt l (263 05/2015) -->7/18 (249) --> 246 today -goals last visit: 1) 3 healthy meals per day. 2) Have healthy snacks on hand to eat when you crave something unhealthy - nuts, seeds, unsweet peanut butter on celery or 100% whole grain cracker, etc 3) Eat breakfast every morning: un sweet cheerios with nuts, berries, alternative milk. 4) Salad with grilled chicken or lunch meat, lots of veggies, nuts or seeds and healthy dressing for lunch - can be a lot of veggies and a portion of meat. Every day! 5) Healthy dinner every night. 6)Exercise log. -not meeting her goals, has improved in terms of including some (usually not more then 1) healthy real food based meal or snack per day and is eating cherries instead of sweetened sugar, but is adding some sweetened cereal to the cherrios. She wishes she could loose 3 lbs per week, but really doesn't want to given up foods that are processed and have added sugar. She doesn't want to do bariatric surgery or meds. She seems frustrated with the fact that she should eat a healthy diet. See detailed diet journal. She questions if she could exercise more and not give up daily sweets.  ROS: See pertinent positives and negatives per HPI.  Past Medical History:  Diagnosis Date  . Arthritis   . Bilateral dry eyes   . Cancer ([place])   . Fibroid   . Hyperlipidemia   . Kidney stones   . Melanoma of upper arm ([place]) 2009   RIGHT ARM   . Osteopenia 05/2015   T score -1.3 FRAX 6%/0.5% stable from prior DEXA  . UTI (urinary tract infection)   . Vitamin D deficiency 07/2009   LOW VITAMIN D 29  . Vitamin deficiency 07/2008   VITAMIN D LOW 23    Past Surgical History:  Procedure Laterality Date  . ABDOMINAL SURGERY  1975   ovairan cyst   . CHOLECYSTECTOMY    . DERMOID CYST  EXCISION    . DILATION AND CURETTAGE OF UTERUS  1998  . GALLBLADDER SURGERY    . HYSTEROSCOPY    . SKIN BIOPSY    . SKIN CANCER EXCISION   2010,2011   2010-RIGHT ARM, 2011-LEFT LOWER LEG.-DR Rolm Bookbinder DERM. DR. Sarajane Jews IS HER DERM SURGEON.    Family History  Problem Relation Age of Onset  . Diabetes Father   . Hypertension Father   . Heart disease Father   . Prostate cancer Father   . Breast cancer Sister        Age 34's  . Skin cancer Sister   . Colon cancer Paternal Grandfather   . Hypertension Mother     Social History   Social History  . Marital status: Single    Spouse name: N/A  . Number of children: N/A  . Years of education: N/A   Social History Main Topics  . Smoking status: Never Smoker  . Smokeless tobacco: Never Used  . Alcohol use Yes     Comment: rare  . Drug use: No  . Sexual activity: No   Other Topics Concern  . None   Social History Narrative  . None     Current Outpatient Prescriptions:  .  alclomethasone (ACLOVATE) 0.05 % cream, APPLY TO ONLY THE AFFECTED AREA ON THE SKIN PRN, Disp: , Rfl: 0 .  XIIDRA 5 % SOLN, Place 1 drop into both eyes daily. , Disp: , Rfl: 1  EXAM:  Vitals:  10/06/16 1417  BP: 110/80  Pulse: 66  Temp: 98.2 F (36.8 C)    Body mass index is 36.46 kg/m.  GENERAL: vitals reviewed and listed above, alert, oriented, appears well hydrated and in no acute distress  HEENT: atraumatic, conjunttiva clear, no obvious abnormalities on inspection of external nose and ears  NECK: no obvious masses on inspection  LUNGS: clear to auscultation bilaterally, no wheezes, rales or rhonchi, good air movement  CV: HRRR, no peripheral edema  MS: moves all extremities without noticeable abnormality  PSYCH: pleasant and cooperative, no obvious depression or anxiety  ASSESSMENT AND PLAN:  Discussed the following assessment and plan:  Class 2 obesity with serious comorbidity and body mass index (BMI) of 36.0 to 36.9 in adult, unspecified obesity type  Hyperlipidemia, unspecified hyperlipidemia type  Poor diet  -congratulated her on 17lb weight loss since  April -we discussed other options for wt loss with her today as she is frustrated with her progress - meds, surgery, she wants to do this with lifestyle changes -lengthy > 30 minute visit discussing a healthy diet, why it is worth it, suggestions for swapping out unhealthy snacks for real food that are simple and cheap; restaurant options, meal plans -she actually likes a fair number of healthy foods but seems to just not buy them or eat them and instead eats mostly sweetened foods, simple starches, processed foods and continues to drink sweetened beverages often -she wants to stick with the same goals from the last visit for this visit and requests to follow up in 3-4 weeks -Patient advised to return or notify a doctor immediately if symptoms worsen or persist or new concerns arise.  Patient Instructions  Follow up in 3-4 weeks.  1) 3 healthy meals per day. 2) Have healthy snacks on hand to eat when you crave something unhealthy - nuts, seeds, unsweet peanut butter on celery or 100% whole grain cracker, etc 3) Eat breakfast every morning: un sweet cheerios with nuts, berries, alternative milk. 4) Salad with grilled chicken or lunch meat, lots of veggies, nuts or seeds and healthy dressing for lunch - can be a lot of veggies and a portion of meat. Every day! 5) Healthy dinner every night. 6)Exercise log.   Colin Benton R., DO

## 2016-10-06 ENCOUNTER — Ambulatory Visit (INDEPENDENT_AMBULATORY_CARE_PROVIDER_SITE_OTHER): Payer: BC Managed Care – PPO | Admitting: Family Medicine

## 2016-10-06 ENCOUNTER — Encounter: Payer: Self-pay | Admitting: Family Medicine

## 2016-10-06 VITALS — BP 110/80 | HR 66 | Temp 98.2°F | Ht 69.0 in | Wt 246.9 lb

## 2016-10-06 DIAGNOSIS — IMO0001 Reserved for inherently not codable concepts without codable children: Secondary | ICD-10-CM

## 2016-10-06 DIAGNOSIS — E669 Obesity, unspecified: Secondary | ICD-10-CM | POA: Diagnosis not present

## 2016-10-06 DIAGNOSIS — E639 Nutritional deficiency, unspecified: Secondary | ICD-10-CM | POA: Diagnosis not present

## 2016-10-06 DIAGNOSIS — Z6836 Body mass index (BMI) 36.0-36.9, adult: Secondary | ICD-10-CM

## 2016-10-06 DIAGNOSIS — E785 Hyperlipidemia, unspecified: Secondary | ICD-10-CM

## 2016-10-06 NOTE — Patient Instructions (Signed)
Follow up in 3-4 weeks.  1) 3 healthy meals per day. 2) Have healthy snacks on hand to eat when you crave something unhealthy - nuts, seeds, unsweet peanut butter on celery or 100% whole grain cracker, etc 3) Eat breakfast every morning: un sweet cheerios with nuts, berries, alternative milk. 4) Salad with grilled chicken or lunch meat, lots of veggies, nuts or seeds and healthy dressing for lunch - can be a lot of veggies and a portion of meat. Every day! 5) Healthy dinner every night. 6)Exercise log.

## 2016-10-12 ENCOUNTER — Ambulatory Visit: Payer: BC Managed Care – PPO | Admitting: Orthotics

## 2016-10-12 DIAGNOSIS — L84 Corns and callosities: Secondary | ICD-10-CM

## 2016-10-12 DIAGNOSIS — M779 Enthesopathy, unspecified: Secondary | ICD-10-CM

## 2016-10-12 DIAGNOSIS — M216X9 Other acquired deformities of unspecified foot: Secondary | ICD-10-CM

## 2016-10-12 NOTE — Progress Notes (Signed)
Brooke [name] came in today for adjustment of CMFO left.  I added 1/16" ppt scaphoid pad left to offload arch pain.  She seemed well pleased with the result.

## 2016-11-02 ENCOUNTER — Ambulatory Visit: Payer: BC Managed Care – PPO | Admitting: Podiatry

## 2016-11-03 ENCOUNTER — Ambulatory Visit: Payer: BC Managed Care – PPO | Admitting: Orthotics

## 2016-11-03 DIAGNOSIS — L84 Corns and callosities: Secondary | ICD-10-CM

## 2016-11-03 DIAGNOSIS — M779 Enthesopathy, unspecified: Secondary | ICD-10-CM

## 2016-11-03 NOTE — Progress Notes (Signed)
Sending left F/O back to [facility] to compare w/ new foam impression to see if there is difference in arch height and depth.

## 2016-11-04 ENCOUNTER — Ambulatory Visit (INDEPENDENT_AMBULATORY_CARE_PROVIDER_SITE_OTHER): Payer: BC Managed Care – PPO | Admitting: Family Medicine

## 2016-11-04 ENCOUNTER — Encounter: Payer: Self-pay | Admitting: Family Medicine

## 2016-11-04 VITALS — BP 116/60 | HR 78 | Temp 98.2°F | Ht 69.0 in | Wt 248.8 lb

## 2016-11-04 DIAGNOSIS — Z6836 Body mass index (BMI) 36.0-36.9, adult: Secondary | ICD-10-CM

## 2016-11-04 DIAGNOSIS — E785 Hyperlipidemia, unspecified: Secondary | ICD-10-CM

## 2016-11-04 DIAGNOSIS — E639 Nutritional deficiency, unspecified: Secondary | ICD-10-CM | POA: Diagnosis not present

## 2016-11-04 DIAGNOSIS — Z723 Lack of physical exercise: Secondary | ICD-10-CM | POA: Diagnosis not present

## 2016-11-04 NOTE — Progress Notes (Signed)
HPI:  Due for labs (lipid, hgba1c), flu shot, colon cancer screening?  Obesity, hyperlipidemia: -wt l (263 05/2015) -->7/18 (249) --> 246 10/06/16 -->  248 -reports has not been good -has not reviewed goals from last visit, so has not done them -wants to loose wight and eat healthier, but has not made it a priority -lots of sweets, sweetened beverages and simple starches/processed foods on log -no exercise Goals last visit: 1) 3 healthy meals per day. Has not done. 2) Have healthy snacks on hand to eat when you crave something unhealthy - nuts, seeds, unsweet peanut butter on celery or 100% whole grain cracker, etc - has not done this. 3) Eat breakfast every morning: un sweet cheerios with nuts, berries, alternative milk. Has not done this. 4) Salad with grilled chicken or lunch meat, lots of veggies, nuts or seeds and healthy dressing for lunch - can be a lot of veggies and a portion of meat. Every day! Has not done this. 5) Healthy dinner every night. Has not done this. 6)Exercise log. Has not been exercising.  ROS: See pertinent positives and negatives per HPI.  Past Medical History:  Diagnosis Date  . Arthritis   . Bilateral dry eyes   . Cancer ([place])   . Fibroid   . Hyperlipidemia   . Kidney stones   . Melanoma of upper arm ([place]) 2009   RIGHT ARM   . Osteopenia 05/2015   T score -1.3 FRAX 6%/0.5% stable from prior DEXA  . UTI (urinary tract infection)   . Vitamin D deficiency 07/2009   LOW VITAMIN D 29  . Vitamin deficiency 07/2008   VITAMIN D LOW 23    Past Surgical History:  Procedure Laterality Date  . ABDOMINAL SURGERY  1975   ovairan cyst   . CHOLECYSTECTOMY    . DERMOID CYST  EXCISION    . DILATION AND CURETTAGE OF UTERUS  1998  . GALLBLADDER SURGERY    . HYSTEROSCOPY    . SKIN BIOPSY    . SKIN CANCER EXCISION  2010,2011   2010-RIGHT ARM, 2011-LEFT LOWER LEG.-DR Rolm Bookbinder DERM. DR. Sarajane Jews IS HER DERM SURGEON.    Family History  Problem Relation Age of  Onset  . Diabetes Father   . Hypertension Father   . Heart disease Father   . Prostate cancer Father   . Breast cancer Sister        Age 40's  . Skin cancer Sister   . Colon cancer Paternal Grandfather   . Hypertension Mother     Social History   Social History  . Marital status: Single    Spouse name: N/A  . Number of children: N/A  . Years of education: N/A   Social History Main Topics  . Smoking status: Never Smoker  . Smokeless tobacco: Never Used  . Alcohol use Yes     Comment: rare  . Drug use: No  . Sexual activity: No   Other Topics Concern  . None   Social History Narrative  . None     Current Outpatient Prescriptions:  .  alclomethasone (ACLOVATE) 0.05 % cream, APPLY TO ONLY THE AFFECTED AREA ON THE SKIN PRN, Disp: , Rfl: 0 .  XIIDRA 5 % SOLN, Place 1 drop into both eyes daily. , Disp: , Rfl: 1  EXAM:  Vitals:   11/04/16 1433  BP: 116/60  Pulse: 78  Temp: 98.2 F (36.8 C)    Body mass index is 36.74 kg/m.  GENERAL:  vitals reviewed and listed above, alert, oriented, appears well hydrated and in no acute distress  HEENT: atraumatic, conjunttiva clear, no obvious abnormalities on inspection of external nose and ears  NECK: no obvious masses on inspection  MS: moves all extremities without noticeable abnormality  PSYCH: pleasant and cooperative, no obvious depression or anxiety  ASSESSMENT AND PLAN:  Discussed the following assessment and plan:  BMI 36.0-36.9,adult  Hyperlipidemia, unspecified hyperlipidemia type  Poor diet  Does not exercise  -reviewed motivation and goals -she feels wants to do this, wants to do better, agrees to reviewed goals and try again dialy to keep goals -wants to keep goals the same -discussed restaurants, meal service plans, etc that could help if does not wish to cook -labs next visit -declined flu shot  Patient Instructions  BEFORE YOU LEAVE: -? Flu shot, ? Colon cancer screening -follow up:  1  month morning appointment, come fasting  Print out goals and review 2-3 times per day!  GOALS: 1) 3 healthy meals per day. 2) Have healthy snacks on hand to eat when you crave something unhealthy - nuts, seeds, unsweet peanut butter on celery or 100% whole grain cracker, etc 3) Eat breakfast every morning: un sweet cheerios with nuts, berries, alternative milk. 4) Salad with grilled chicken or lunch meat, lots of veggies, nuts or seeds and healthy dressing for lunch - can be a lot of veggies and a portion of meat. Every day! 5) Healthy dinner every night. 6)Exercise log.    Colin Benton R., DO

## 2016-11-04 NOTE — Patient Instructions (Signed)
BEFORE YOU LEAVE: -? Flu shot, ? Colon cancer screening -follow up:  1 month morning appointment, come fasting  Print out goals and review 2-3 times per day!  GOALS: 1) 3 healthy meals per day. 2) Have healthy snacks on hand to eat when you crave something unhealthy - nuts, seeds, unsweet peanut butter on celery or 100% whole grain cracker, etc 3) Eat breakfast every morning: un sweet cheerios with nuts, berries, alternative milk. 4) Salad with grilled chicken or lunch meat, lots of veggies, nuts or seeds and healthy dressing for lunch - can be a lot of veggies and a portion of meat. Every day! 5) Healthy dinner every night. 6)Exercise log.

## 2016-11-21 ENCOUNTER — Other Ambulatory Visit: Payer: BC Managed Care – PPO | Admitting: Orthotics

## 2016-11-30 ENCOUNTER — Ambulatory Visit (INDEPENDENT_AMBULATORY_CARE_PROVIDER_SITE_OTHER): Payer: BC Managed Care – PPO | Admitting: Podiatry

## 2016-11-30 ENCOUNTER — Encounter: Payer: Self-pay | Admitting: Podiatry

## 2016-11-30 DIAGNOSIS — L84 Corns and callosities: Secondary | ICD-10-CM

## 2016-12-01 NOTE — Progress Notes (Signed)
Subjective:    Patient ID: Brooke [name], female   DOB: 66 y.o.   MRN: [id number]   HPI patient presents with chronic lesions plantar aspect left and several smaller lesions on the right    ROS      Objective:  Physical Exam neurovascular status intact with keratotic lesions that are painful left over right     Assessment:  Chronic lesion formation       Plan:   Debrided lesions and reappoint for routine care

## 2016-12-08 ENCOUNTER — Ambulatory Visit: Payer: BC Managed Care – PPO | Admitting: Family Medicine

## 2016-12-14 NOTE — Progress Notes (Signed)
HPI:  Follow up obesity, hyperlipidemia, very poor diet. Have been working with her intensively to help her improve her diet and to help her eat healthier. She has really struggled with this, but has improved some in her understanding of what is healthy and what is not. She refuses to eliminate junk food, sweets, and simple starches even though she has found out she does like some healthier options and has made steps toward a healthier lifestyle.We have discussed the risks and health consequences associated with a diet mostly comprised of process, sugary foods. She has lost almost 20 lbs over the last few months even just with small changes in the diet and adding some exercise. Today reports did not bring log - has been bad this week. She has a head time giving up sweets and snacks but knows she should. Eating TV dinners to help with portions - but is a lot of pasta.no cp, sob, swelling.  Wt from 248 last visit to 245 today. Due for labs  ROS: See pertinent positives and negatives per HPI.  Past Medical History:  Diagnosis Date  . Arthritis   . Bilateral dry eyes   . Cancer ([place])   . Fibroid   . Hyperlipidemia   . Kidney stones   . Melanoma of upper arm ([place]) 2009   RIGHT ARM   . Osteopenia 05/2015   T score -1.3 FRAX 6%/0.5% stable from prior DEXA  . UTI (urinary tract infection)   . Vitamin D deficiency 07/2009   LOW VITAMIN D 29  . Vitamin deficiency 07/2008   VITAMIN D LOW 23    Past Surgical History:  Procedure Laterality Date  . ABDOMINAL SURGERY  1975   ovairan cyst   . CHOLECYSTECTOMY    . DERMOID CYST  EXCISION    . DILATION AND CURETTAGE OF UTERUS  1998  . GALLBLADDER SURGERY    . HYSTEROSCOPY    . SKIN BIOPSY    . SKIN CANCER EXCISION  2010,2011   2010-RIGHT ARM, 2011-LEFT LOWER LEG.-DR Rolm Bookbinder DERM. DR. Sarajane Jews IS HER DERM SURGEON.    Family History  Problem Relation Age of Onset  . Diabetes Father   . Hypertension Father   . Heart disease Father   .  Prostate cancer Father   . Breast cancer Sister        Age 16's  . Skin cancer Sister   . Colon cancer Paternal Grandfather   . Hypertension Mother     Social History   Social History  . Marital status: Single    Spouse name: N/A  . Number of children: N/A  . Years of education: N/A   Social History Main Topics  . Smoking status: Never Smoker  . Smokeless tobacco: Never Used  . Alcohol use Yes     Comment: rare  . Drug use: No  . Sexual activity: No   Other Topics Concern  . None   Social History Narrative  . None     Current Outpatient Prescriptions:  .  alclomethasone (ACLOVATE) 0.05 % cream, APPLY TO ONLY THE AFFECTED AREA ON THE SKIN PRN, Disp: , Rfl: 0 .  XIIDRA 5 % SOLN, Place 1 drop into both eyes daily. , Disp: , Rfl: 1  EXAM:  Vitals:   12/16/16 0956  BP: 128/82  Pulse: 64  Temp: 97.7 F (36.5 C)    Body mass index is 36.22 kg/m.  GENERAL: vitals reviewed and listed above, alert, oriented, appears well hydrated and  in no acute distress  HEENT: atraumatic, conjunttiva clear, no obvious abnormalities on inspection of external nose and ears  NECK: no obvious masses on inspection  LUNGS: clear to auscultation bilaterally, no wheezes, rales or rhonchi, good air movement  CV: HRRR, no peripheral edema  MS: moves all extremities without noticeable abnormality  PSYCH: pleasant and cooperative, no obvious depression or anxiety  ASSESSMENT AND PLAN:  Discussed the following assessment and plan:  BMI 36.0-36.9,adult - Plan: Basic metabolic panel, Hemoglobin A1c, Lipid panel  Hyperlipidemia, unspecified hyperlipidemia type  Vitamin D insufficiency - Plan: VITAMIN D 25 Hydroxy (Vit-D Deficiency, Fractures)  -lifestyle, healthy foods, metabolism food discussed for > 20 minutes face to face, advised continue weight loss with goal of several lb reduction per month for 5 years -labs today -we have discussed other formal wt loss programs, medications  and surgery as well -follow up 1-3 months per pt wishes with diet/exercise log -Patient advised to return or notify a doctor immediately if symptoms worsen or persist or new concerns arise.  Patient Instructions  BEFORE YOU LEAVE: -follow up: 1-3 months per your wishes  We have ordered labs or studies at this visit. It can take up to 1-2 weeks for results and processing. IF results require follow up or explanation, we will call you with instructions. Clinically stable results will be released to your [facility]. If you have not heard from [place] or cannot find your results in [facility] in 2 weeks please contact our office at [phone].  If you are not yet signed up for [facility], please consider signing up.  Continue to work to reduce sugar in the diet and eat healthy whole foods.  WE NOW OFFER   [facility] FAST TRACK!!!  SAME DAY Appointments for ACUTE CARE  Such as: Sprains, Injuries, cuts, abrasions, rashes, muscle pain, joint pain, back pain Colds, flu, sore throats, headache, allergies, cough, fever  Ear pain, sinus and eye infections Abdominal pain, nausea, vomiting, diarrhea, upset stomach Animal/insect bites  3 Easy Ways to Schedule: Walk-In Scheduling Call in scheduling Mychart Sign-up: [email or web address]                 Colin Benton R., DO

## 2016-12-16 ENCOUNTER — Ambulatory Visit (INDEPENDENT_AMBULATORY_CARE_PROVIDER_SITE_OTHER): Payer: BC Managed Care – PPO | Admitting: Family Medicine

## 2016-12-16 ENCOUNTER — Encounter: Payer: Self-pay | Admitting: Family Medicine

## 2016-12-16 VITALS — BP 128/82 | HR 64 | Temp 97.7°F | Ht 69.0 in | Wt 245.3 lb

## 2016-12-16 DIAGNOSIS — E785 Hyperlipidemia, unspecified: Secondary | ICD-10-CM | POA: Diagnosis not present

## 2016-12-16 DIAGNOSIS — E559 Vitamin D deficiency, unspecified: Secondary | ICD-10-CM

## 2016-12-16 DIAGNOSIS — Z6836 Body mass index (BMI) 36.0-36.9, adult: Secondary | ICD-10-CM

## 2016-12-16 LAB — BASIC METABOLIC PANEL
BUN: 11 mg/dL (ref 6–23)
CALCIUM: 9.3 mg/dL (ref 8.4–10.5)
CO2: 30 mEq/L (ref 19–32)
Chloride: 101 mEq/L (ref 96–112)
Creatinine, Ser: 0.75 mg/dL (ref 0.40–1.20)
GFR: 82.17 mL/min (ref >60.00)
Glucose, Bld: 92 mg/dL (ref 70–99)
POTASSIUM: 4.4 meq/L (ref 3.5–5.1)
SODIUM: 137 meq/L (ref 135–145)

## 2016-12-16 LAB — LIPID PANEL
Cholesterol: 221 mg/dL — ABNORMAL HIGH (ref 0–200)
HDL: 53.6 mg/dL (ref >39.00)
LDL Cholesterol: 135 mg/dL — ABNORMAL HIGH (ref 0–99)
NONHDL: 167.59
TRIGLYCERIDES: 165 mg/dL — AB (ref 0.0–149.0)
Total CHOL/HDL Ratio: 4
VLDL: 33 mg/dL (ref 0.0–40.0)

## 2016-12-16 LAB — HEMOGLOBIN A1C: HEMOGLOBIN A1C: 5.8 % (ref 4.6–6.5)

## 2016-12-16 LAB — VITAMIN D 25 HYDROXY (VIT D DEFICIENCY, FRACTURES): VITD: 41.7 ng/mL (ref 30.00–100.00)

## 2016-12-16 NOTE — Patient Instructions (Signed)
BEFORE YOU LEAVE: -follow up: 1-3 months per your wishes  We have ordered labs or studies at this visit. It can take up to 1-2 weeks for results and processing. IF results require follow up or explanation, we will call you with instructions. Clinically stable results will be released to your [facility]. If you have not heard from [place] or cannot find your results in [facility] in 2 weeks please contact our office at [phone].  If you are not yet signed up for [facility], please consider signing up.  Continue to work to reduce sugar in the diet and eat healthy whole foods.  WE NOW OFFER   [facility] FAST TRACK!!!  SAME DAY Appointments for ACUTE CARE  Such as: Sprains, Injuries, cuts, abrasions, rashes, muscle pain, joint pain, back pain Colds, flu, sore throats, headache, allergies, cough, fever  Ear pain, sinus and eye infections Abdominal pain, nausea, vomiting, diarrhea, upset stomach Animal/insect bites  3 Easy Ways to Schedule: Walk-In Scheduling Call in scheduling Mychart Sign-up: [email or web address]

## 2017-01-13 ENCOUNTER — Ambulatory Visit: Payer: BC Managed Care – PPO | Admitting: Family Medicine

## 2017-01-13 ENCOUNTER — Encounter: Payer: Self-pay | Admitting: Family Medicine

## 2017-01-13 VITALS — BP 122/88 | HR 72 | Temp 98.2°F | Ht 69.0 in | Wt 238.6 lb

## 2017-01-13 DIAGNOSIS — N6320 Unspecified lump in the left breast, unspecified quadrant: Secondary | ICD-10-CM

## 2017-01-13 NOTE — Patient Instructions (Signed)
See breast center for lump as planned. We sent orders.  Follow up with me as needed in 1-3 months for further help with diet and exercise.

## 2017-01-13 NOTE — Progress Notes (Signed)
HPI:   Acute visit for L breast lump: -she noticed a few days ago -non tender, no discharge or skin changes -sister with breast Ca - she reports sister had genetic testing and was told was not hereditary -needs orders to [facility] for diagnostic mammo she reports is already scheduled for next week  Obesity 245 --> 238 -doing better with diet this month, still some sugar but has cut back   ROS: See pertinent positives and negatives per HPI.  Past Medical History:  Diagnosis Date  . Arthritis   . Bilateral dry eyes   . Cancer ([place])   . Fibroid   . Hyperlipidemia   . Kidney stones   . Melanoma of upper arm ([place]) 2009   RIGHT ARM   . Osteopenia 05/2015   T score -1.3 FRAX 6%/0.5% stable from prior DEXA  . UTI (urinary tract infection)   . Vitamin D deficiency 07/2009   LOW VITAMIN D 29  . Vitamin deficiency 07/2008   VITAMIN D LOW 23    Past Surgical History:  Procedure Laterality Date  . ABDOMINAL SURGERY  1975   ovairan cyst   . CHOLECYSTECTOMY    . DERMOID CYST  EXCISION    . DILATION AND CURETTAGE OF UTERUS  1998  . GALLBLADDER SURGERY    . HYSTEROSCOPY    . SKIN BIOPSY    . SKIN CANCER EXCISION  2010,2011   2010-RIGHT ARM, 2011-LEFT LOWER LEG.-DR Rolm Bookbinder DERM. DR. Sarajane Jews IS HER DERM SURGEON.    Family History  Problem Relation Age of Onset  . Diabetes Father   . Hypertension Father   . Heart disease Father   . Prostate cancer Father   . Breast cancer Sister        Age 73's  . Skin cancer Sister   . Colon cancer Paternal Grandfather   . Hypertension Mother     Social History   Socioeconomic History  . Marital status: Single    Spouse name: None  . Number of children: None  . Years of education: None  . Highest education level: None  Social Needs  . Financial resource strain: None  . Food insecurity - worry: None  . Food insecurity - inability: None  . Transportation needs - medical: None  . Transportation needs - non-medical: None   Occupational History  . None  Tobacco Use  . Smoking status: Never Smoker  . Smokeless tobacco: Never Used  Substance and Sexual Activity  . Alcohol use: Yes    Comment: rare  . Drug use: No  . Sexual activity: No    Birth control/protection: Post-menopausal  Other Topics Concern  . None  Social History Narrative  . None     Current Outpatient Medications:  .  alclomethasone (ACLOVATE) 0.05 % cream, APPLY TO ONLY THE AFFECTED AREA ON THE SKIN PRN, Disp: , Rfl: 0 .  XIIDRA 5 % SOLN, Place 1 drop into both eyes daily. , Disp: , Rfl: 1  EXAM:  Vitals:   01/13/17 1534  BP: 122/88  Pulse: 72  Temp: 98.2 F (36.8 C)    Body mass index is 35.24 kg/m.  GENERAL: vitals reviewed and listed above, alert, oriented, appears well hydrated and in no acute distress  HEENT: atraumatic, conjunttiva clear, no obvious abnormalities on inspection of external nose and ears  NECK: no obvious masses on inspection  MS: moves all extremities without noticeable abnormality  BREAST: tender irr shaped dense tissue approx 1x2 cm in diameter  L breast at around 11 O'clock, ~ 2 cm from areola, no skin changes/rash/discharge  PSYCH: pleasant and cooperative, no obvious depression or anxiety  ASSESSMENT AND PLAN:  Discussed the following assessment and plan:  Left breast lump  -Orders from breast center were provided by my assistant which we signed and I advised her to fax back immediately for evaluation of breast lump -patient reports visit is already scheduled in 4 days  -Congratulated on lifestyle changes and she may follow-up about this at another time, she currently is worried about the breast lump -Patient advised to return or notify a doctor immediately if symptoms worsen or persist or new concerns arise.  Patient Instructions  See breast center for lump as planned. We sent orders.  Follow up with me as needed in 1-3 months for further help with diet and exercise.   Brooke [name]  R., DO

## 2017-01-18 ENCOUNTER — Encounter: Payer: Self-pay | Admitting: Gynecology

## 2017-01-23 ENCOUNTER — Encounter: Payer: Self-pay | Admitting: Gynecology

## 2017-01-23 ENCOUNTER — Other Ambulatory Visit: Payer: Self-pay | Admitting: Radiology

## 2017-01-23 DIAGNOSIS — N6311 Unspecified lump in the right breast, upper outer quadrant: Secondary | ICD-10-CM

## 2017-01-23 DIAGNOSIS — N6322 Unspecified lump in the left breast, upper inner quadrant: Secondary | ICD-10-CM

## 2017-01-26 ENCOUNTER — Encounter: Payer: Self-pay | Admitting: Family Medicine

## 2017-01-26 ENCOUNTER — Other Ambulatory Visit: Payer: Self-pay | Admitting: Radiology

## 2017-01-30 ENCOUNTER — Telehealth: Payer: Self-pay | Admitting: Oncology

## 2017-01-30 ENCOUNTER — Encounter: Payer: Self-pay | Admitting: *Deleted

## 2017-01-30 NOTE — Telephone Encounter (Signed)
Patient left message confirming afternoon [facility] appointment for 02/01/17

## 2017-01-31 ENCOUNTER — Other Ambulatory Visit: Payer: Self-pay

## 2017-01-31 ENCOUNTER — Ambulatory Visit
Admission: RE | Admit: 2017-01-31 | Discharge: 2017-01-31 | Disposition: A | Discharge status: Home / Self Care | Payer: BC Managed Care – PPO | Source: Ambulatory Visit | Attending: Radiology | Admitting: Radiology

## 2017-01-31 ENCOUNTER — Other Ambulatory Visit: Payer: Self-pay | Admitting: Radiology

## 2017-01-31 ENCOUNTER — Other Ambulatory Visit: Payer: Self-pay | Admitting: *Deleted

## 2017-01-31 DIAGNOSIS — C50812 Malignant neoplasm of overlapping sites of left female breast: Secondary | ICD-10-CM | POA: Insufficient documentation

## 2017-01-31 DIAGNOSIS — Z170 Estrogen receptor positive status [ER+]: Principal | ICD-10-CM

## 2017-01-31 DIAGNOSIS — N6322 Unspecified lump in the left breast, upper inner quadrant: Secondary | ICD-10-CM

## 2017-01-31 DIAGNOSIS — N6311 Unspecified lump in the right breast, upper outer quadrant: Secondary | ICD-10-CM

## 2017-02-01 ENCOUNTER — Encounter: Payer: Self-pay | Admitting: Oncology

## 2017-02-01 ENCOUNTER — Ambulatory Visit
Admission: RE | Admit: 2017-02-01 | Discharge: 2017-02-01 | Disposition: A | Discharge status: Home / Self Care | Payer: BC Managed Care – PPO | Source: Ambulatory Visit | Attending: Radiation Oncology | Admitting: Radiation Oncology

## 2017-02-01 ENCOUNTER — Ambulatory Visit: Payer: BC Managed Care – PPO | Admitting: Podiatry

## 2017-02-01 ENCOUNTER — Ambulatory Visit: Payer: BC Managed Care – PPO | Attending: General Surgery | Admitting: Physical Therapy

## 2017-02-01 ENCOUNTER — Ambulatory Visit (HOSPITAL_BASED_OUTPATIENT_CLINIC_OR_DEPARTMENT_OTHER): Payer: BC Managed Care – PPO | Admitting: Oncology

## 2017-02-01 ENCOUNTER — Other Ambulatory Visit (HOSPITAL_BASED_OUTPATIENT_CLINIC_OR_DEPARTMENT_OTHER): Payer: BC Managed Care – PPO

## 2017-02-01 VITALS — BP 151/90 | HR 69 | Temp 98.5°F | Resp 18 | Wt 231.6 lb

## 2017-02-01 DIAGNOSIS — Z170 Estrogen receptor positive status [ER+]: Principal | ICD-10-CM

## 2017-02-01 DIAGNOSIS — M8580 Other specified disorders of bone density and structure, unspecified site: Secondary | ICD-10-CM

## 2017-02-01 DIAGNOSIS — R293 Abnormal posture: Secondary | ICD-10-CM | POA: Diagnosis present

## 2017-02-01 DIAGNOSIS — C50812 Malignant neoplasm of overlapping sites of left female breast: Secondary | ICD-10-CM | POA: Diagnosis not present

## 2017-02-01 DIAGNOSIS — Z803 Family history of malignant neoplasm of breast: Secondary | ICD-10-CM

## 2017-02-01 DIAGNOSIS — C50212 Malignant neoplasm of upper-inner quadrant of left female breast: Secondary | ICD-10-CM | POA: Diagnosis not present

## 2017-02-01 LAB — CBC WITH DIFFERENTIAL/PLATELET
BASO%: 0.3 % (ref 0.0–2.0)
Basophils Absolute: 0 10*3/uL (ref 0.0–0.1)
EOS%: 1.3 % (ref 0.0–7.0)
Eosinophils Absolute: 0.1 10*3/uL (ref 0.0–0.5)
HCT: 41.5 % (ref 34.8–46.6)
HEMOGLOBIN: 13.7 g/dL (ref 11.6–15.9)
LYMPH#: 1.4 10*3/uL (ref 0.9–3.3)
LYMPH%: 20.1 % (ref 14.0–49.7)
MCH: 29.3 pg (ref 25.1–34.0)
MCHC: 33 g/dL (ref 31.5–36.0)
MCV: 88.9 fL (ref 79.5–101.0)
MONO#: 0.7 10*3/uL (ref 0.1–0.9)
MONO%: 9.8 % (ref 0.0–14.0)
NEUT%: 68.5 % (ref 38.4–76.8)
NEUTROS ABS: 4.9 10*3/uL (ref 1.5–6.5)
PLATELETS: 302 10*3/uL (ref 145–400)
RBC: 4.67 10*6/uL (ref 3.70–5.45)
RDW: 13.4 % (ref 11.2–14.5)
WBC: 7.1 10*3/uL (ref 3.9–10.3)

## 2017-02-01 LAB — COMPREHENSIVE METABOLIC PANEL
ALT: 15 U/L (ref 0–55)
AST: 19 U/L (ref 5–34)
Albumin: 4.2 g/dL (ref 3.5–5.0)
Alkaline Phosphatase: 76 U/L (ref 40–150)
Anion Gap: 9 mEq/L (ref 3–11)
BUN: 9.2 mg/dL (ref 7.0–26.0)
CO2: 23 mEq/L (ref 22–29)
Calcium: 9.7 mg/dL (ref 8.4–10.4)
Chloride: 106 mEq/L (ref 98–109)
Creatinine: 0.8 mg/dL (ref 0.6–1.1)
EGFR: >60 mL/min/{1.73_m2} (ref >60)
Glucose: 88 mg/dl (ref 70–140)
Potassium: 3.9 mEq/L (ref 3.5–5.1)
Sodium: 138 mEq/L (ref 136–145)
Total Bilirubin: 0.85 mg/dL (ref 0.20–1.20)
Total Protein: 7.3 g/dL (ref 6.4–8.3)

## 2017-02-01 NOTE — Progress Notes (Signed)
Nutrition Assessment  Reason for Assessment:  Pt seen in Breast Clinic  ASSESSMENT:   66 year old female with new diagnosis of breast cancer.  Past medical history of abdominal surgery to remove dermoid cyst on right ovary, appendix also removed.   Patient reports good appetite and has been trying to loose weight recently.  Medications:  reviewed  Labs: reviewed  Anthropometrics:   Height: 69 inches Weight: 231 lb 9.6 oz BMI: 34   NUTRITION DIAGNOSIS: Food and nutrition related knowledge deficit related to new diagnosis of breast cancer as evidenced by no prior need for nutrition related information.  INTERVENTION:   Discussed and provided packet of information regarding nutritional tips for breast cancer patients.  Questions answered.  Teachback method used.  Contact information provided and patient knows to contact me with questions/concerns.    MONITORING, EVALUATION, and GOAL: Pt will consume a healthy plant based diet to maintain lean body mass throughout treatment.   Topanga Alvelo B. Zenia Resides, [place], [place] Registered Dietitian [phone] (pager)

## 2017-02-01 NOTE — Progress Notes (Signed)
[place]  [phone] Fax:([phone]     ID: MAIREAD SCHWARZKOPF DOB: 12-28-1950  MR#: [id number]  STM#:[id number]  Patient Care Team: [name], DO as PCP - General (Family Medicine) Phineas Real, Belinda Block, MD (Obstetrics and Gynecology) Rolm Bookbinder, MD as Attending Physician (Dermatology) Andriea Hasegawa, Virgie Dad, MD as Consulting Physician (Oncology) Kyung Rudd, MD as Consulting Physician (Radiation Oncology) Jovita Kussmaul, MD as Consulting Physician (General Surgery) Chauncey Cruel, MD OTHER MD:  CHIEF COMPLAINT: Estrogen receptor positive breast cancer  CURRENT TREATMENT: Neoadjuvant chemotherapy   HISTORY OF CURRENT ILLNESS: Marylou herself palpated a change in her left breast and brought this to medical attention.  On 01/18/2017 she underwent bilateral diagnostic mammography with tomography and bilateral breast ultrasonography at [facility].  The breast density was category B.  At the most recent exam was from December 2016.  In the right breast upper outer quadrant there was a 1 cm irregular mass associated with punctate calcification.  By ultrasound this was not well-defined, and it was biopsied on 01/26/2017 with tomographic guidance this showed a complex sclerosing lesion with the usual ductal hyperplasia.  The right axilla was sonographically  On the left mammography showed a 4 cm dense mass with indistinct margins in the upper inner quadrant.  Ultrasound defined a 3 cm irregular mass in the upper inner quadrant of the left breast which was palpable.  There was an abnormal lymph node in the left axilla.  Biopsy of the left mass and abnormal lymph node ([id number], on 01/26/2017) showed both to be involved by invasive ductal carcinoma, E-cadherin positive, estrogen and progesterone receptors both 100% positive, with strong staining intensity, with MIB-1 between 15-20%.  There was no HER-2 amplification, the signals ratio being 1.31/1.58 and the number per  cell 2.30/2.05  The patient's subsequent history is as detailed below.  INTERVAL HISTORY: Stephine was evaluated in the multidisciplinary breast cancer clinic 02/01/2017. Her case was also presented at the multidisciplinary breast cancer conference on the same day. At that time a preliminary plan was proposed: Breast MRI, with consideration of neoadjuvant chemotherapy, but also consideration of Mammaprint depending on MRI results; definitive surgery, adjuvant radiation and antiestrogens.   REVIEW OF SYSTEMS: Aside from the mass itself, there were no specific symptoms leading to the original mammogram, which was routinely scheduled. The patient denies unusual headaches, visual changes, nausea, vomiting, stiff neck, dizziness, or gait imbalance. There has been no cough, phlegm production, or pleurisy, no chest pain or pressure, and no change in bowel or bladder habits. The patient denies fever, rash, bleeding, unexplained fatigue or unexplained weight loss. A detailed review of systems was otherwise entirely negative.   PAST MEDICAL HISTORY: Past Medical History:  Diagnosis Date  . Arthritis   . Bilateral dry eyes   . Cancer ([place])   . Fibroid   . Hyperlipidemia   . Kidney stones   . Melanoma of upper arm ([place]) 2009   RIGHT ARM   . Osteopenia 05/2015   T score -1.3 FRAX 6%/0.5% stable from prior DEXA  . UTI (urinary tract infection)   . Vitamin D deficiency 07/2009   LOW VITAMIN D 29  . Vitamin deficiency 07/2008   VITAMIN D LOW 23    PAST SURGICAL HISTORY: Past Surgical History:  Procedure Laterality Date  . ABDOMINAL SURGERY  1975   ovairan cyst   . CHOLECYSTECTOMY    . DERMOID CYST  EXCISION    . DILATION AND CURETTAGE OF UTERUS  [place]    . HYSTEROSCOPY    . SKIN BIOPSY    . SKIN CANCER EXCISION  2010,2011   2010-RIGHT ARM, 2011-LEFT LOWER LEG.-DR Rolm Bookbinder DERM. DR. Sarajane Jews IS HER DERM SURGEON.    FAMILY HISTORY Family History  Problem Relation Age  of Onset  . Diabetes Father   . Hypertension Father   . Heart disease Father   . Prostate cancer Father   . Breast cancer Sister        Age 37's  . Skin cancer Sister   . Colon cancer Paternal Grandfather   . Hypertension Mother   The patient's father was diagnosed with prostate cancer in his 43s and died at age 58 from complications of that disease.  The patient's mother died at age 35 with congestive heart failure.  The patient had no brothers, 3 sisters.  One sister had breast cancer at age 14 and again at age 23.  She has been genetically tested but surprisingly the sister does not know her genetics testing results.  In addition a paternal grandfather had colon cancer in his 56s.  There is no history of ovarian cancer in the family to the patient's knowledge  GYNECOLOGIC HISTORY:  Patient's last menstrual period was 03/18/1997. Menarche age 19.  The patient is GX P0.  She stopped having periods in 1999 and took hormone replacement approximately 5 years.  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  She is single and lives alone with no pets.   ADVANCED DIRECTIVES:    HEALTH MAINTENANCE: Social History   Tobacco Use  . Smoking status: Never Smoker  . Smokeless tobacco: Never Used  Substance Use Topics  . Alcohol use: No    Frequency: Never    Comment: rare  . Drug use: No     Colonoscopy: Never  PAP:  Bone density: 2017/osteopenia   Allergies  Allergen Reactions  . Benzalkonium Chloride   . Latex     rash  . Neosporin [Neomycin-Bacitracin Zn-Polymyx]     Current Outpatient Medications  Medication Sig Dispense Refill  . alclomethasone (ACLOVATE) 0.05 % cream APPLY TO ONLY THE AFFECTED AREA ON THE SKIN PRN  0  . XIIDRA 5 % SOLN Place 1 drop into both eyes daily.   1   No current facility-administered medications for this visit.     OBJECTIVE:  Middle-aged white woman who appears stated age  66:   02/01/17 1250  BP: (!) 151/90  Pulse: 69  Resp: 18  Temp: 98.5 F (36.9 C)  SpO2: 97%     Body mass index is 34.2 kg/m.   Wt Readings from Last 3 Encounters:  02/01/17 231 lb 9.6 oz (105.1 kg)  01/13/17 238 lb 9.6 oz (108.2 kg)  12/16/16 245 lb 4.8 oz (111.3 kg)      ECOG FS:0 - Asymptomatic  Ocular: Sclerae unicteric, pupils round and equal Ear-nose-throat: Oropharynx clear and moist Lymphatic: No cervical or supraclavicular adenopathy Lungs no rales or rhonchi Heart regular rate and rhythm Abd soft, nontender, positive bowel sounds MSK no focal spinal tenderness, no joint edema Neuro: non-focal, well-oriented, appropriate affect Breasts: Status post bilateral biopsies, with bilateral ecchymoses.  This confuses breast palpation.  I do not palpate any axillary adenopathy   LAB RESULTS:  CMP     Component Value Date/Time   NA 138 02/01/2017 1212  K 3.9 02/01/2017 1212   CL 101 12/16/2016 1046   CO2 23 02/01/2017 1212   GLUCOSE 88 02/01/2017 1212   BUN 9.2 02/01/2017 1212   CREATININE 0.8 02/01/2017 1212   CALCIUM 9.7 02/01/2017 1212   PROT 7.3 02/01/2017 1212   ALBUMIN 4.2 02/01/2017 1212   AST 19 02/01/2017 1212   ALT 15 02/01/2017 1212   ALKPHOS 76 02/01/2017 1212   BILITOT 0.85 02/01/2017 1212    No results found for: TOTALPROTELP, ALBUMINELP, A1GS, A2GS, BETS, BETA2SER, GAMS, MSPIKE, SPEI  No results found for: Nils Pyle, [facility]  Lab Results  Component Value Date   WBC 7.1 02/01/2017   NEUTROABS 4.9 02/01/2017   HGB 13.7 02/01/2017   HCT 41.5 02/01/2017   MCV 88.9 02/01/2017   PLT 302 02/01/2017      Chemistry      Component Value Date/Time   NA 138 02/01/2017 1212   K 3.9 02/01/2017 1212   CL 101 12/16/2016 1046   CO2 23 02/01/2017 1212   BUN 9.2 02/01/2017 1212   CREATININE 0.8 02/01/2017 1212      Component Value Date/Time   CALCIUM 9.7 02/01/2017 1212    ALKPHOS 76 02/01/2017 1212   AST 19 02/01/2017 1212   ALT 15 02/01/2017 1212   BILITOT 0.85 02/01/2017 1212       No results found for: LABCA2  No components found for: YQIHKV425  No results for input(s): INR in the last 168 hours.  No results found for: LABCA2  No results found for: ZDG387  No results found for: FIE332  No results found for: RJJ884  No results found for: CA2729  No components found for: HGQUANT  No results found for: CEA1 / No results found for: CEA1   No results found for: AFPTUMOR  No results found for: CHROMOGRNA  No results found for: PSA1  Appointment on 02/01/2017  Component Date Value Ref Range Status  . Sodium 02/01/2017 138  136 - 145 mEq/L Final  . Potassium 02/01/2017 3.9  3.5 - 5.1 mEq/L Final  . Chloride 02/01/2017 106  98 - 109 mEq/L Final  . CO2 02/01/2017 23  22 - 29 mEq/L Final  . Glucose 02/01/2017 88  70 - 140 mg/dl Final   Glucose reference range is for nonfasting patients. Fasting glucose reference range is 70- 100.  Marland Kitchen BUN 02/01/2017 9.2  7.0 - 26.0 mg/dL Final  . Creatinine 02/01/2017 0.8  0.6 - 1.1 mg/dL Final  . Total Bilirubin 02/01/2017 0.85  0.20 - 1.20 mg/dL Final  . Alkaline Phosphatase 02/01/2017 76  40 - 150 U/L Final  . AST 02/01/2017 19  5 - 34 U/L Final  . ALT 02/01/2017 15  0 - 55 U/L Final  . Total Protein 02/01/2017 7.3  6.4 - 8.3 g/dL Final  . Albumin 02/01/2017 4.2  3.5 - 5.0 g/dL Final  . Calcium 02/01/2017 9.7  8.4 - 10.4 mg/dL Final  . Anion Gap 02/01/2017 9  3 - 11 mEq/L Final  . EGFR 02/01/2017 >60  >60 ml/min/1.73 m2 Final   eGFR is calculated using the CKD-EPI Creatinine Equation (2009)  . WBC 02/01/2017 7.1  3.9 - 10.3 10e3/uL Final  . NEUT# 02/01/2017 4.9  1.5 - 6.5 10e3/uL Final  . HGB 02/01/2017 13.7  11.6 - 15.9 g/dL Final  . HCT 02/01/2017 41.5  34.8 - 46.6 % Final  . Platelets 02/01/2017 302  145 - 400 10e3/uL Final  . MCV 02/01/2017 88.9  79.5 -  101.0 fL Final  . MCH 02/01/2017 29.3   25.1 - 34.0 pg Final  . MCHC 02/01/2017 33.0  31.5 - 36.0 g/dL Final  . RBC 02/01/2017 4.67  3.70 - 5.45 10e6/uL Final  . RDW 02/01/2017 13.4  11.2 - 14.5 % Final  . lymph# 02/01/2017 1.4  0.9 - 3.3 10e3/uL Final  . MONO# 02/01/2017 0.7  0.1 - 0.9 10e3/uL Final  . Eosinophils Absolute 02/01/2017 0.1  0.0 - 0.5 10e3/uL Final  . Basophils Absolute 02/01/2017 0.0  0.0 - 0.1 10e3/uL Final  . NEUT% 02/01/2017 68.5  38.4 - 76.8 % Final  . LYMPH% 02/01/2017 20.1  14.0 - 49.7 % Final  . MONO% 02/01/2017 9.8  0.0 - 14.0 % Final  . EOS% 02/01/2017 1.3  0.0 - 7.0 % Final  . BASO% 02/01/2017 0.3  0.0 - 2.0 % Final    (this displays the last labs from the last 3 days)  No results found for: TOTALPROTELP, ALBUMINELP, A1GS, A2GS, BETS, BETA2SER, GAMS, MSPIKE, SPEI (this displays SPEP labs)  No results found for: KPAFRELGTCHN, LAMBDASER, KAPLAMBRATIO (kappa/lambda light chains)  No results found for: HGBA, HGBA2QUANT, HGBFQUANT, HGBSQUAN (Hemoglobinopathy evaluation)   No results found for: LDH  No results found for: IRON, TIBC, IRONPCTSAT (Iron and TIBC)  No results found for: FERRITIN  Urinalysis    Component Value Date/Time   COLORURINE YELLOW 05/29/2015 [place] 05/29/2015 1113   LABSPEC 1.023 05/29/2015 1113   PHURINE 5.5 05/29/2015 1113   GLUCOSEU NEGATIVE 05/29/2015 1113   GLUCOSEU NEGATIVE 11/05/2012 1559   HGBUR NEGATIVE 05/29/2015 1113   BILIRUBINUR NEGATIVE 05/29/2015 1113   BILIRUBINUR n 10/05/2012 1316   KETONESUR NEGATIVE 05/29/2015 1113   PROTEINUR NEGATIVE 05/29/2015 1113   UROBILINOGEN 0.2 11/05/2012 1559   NITRITE NEGATIVE 05/29/2015 1113   LEUKOCYTESUR NEGATIVE 05/29/2015 1113     STUDIES: Outside studies discussed with the patient  ELIGIBLE FOR AVAILABLE RESEARCH PROTOCOL: No  ASSESSMENT: 66 y.o. [place] woman status post bilateral biopsies 01/26/2017, showing  (1) in the right breast, a complex sclerosing lesion  (2) in the left  breast, a cT2 pN1 invasive ductal carcinoma, grade 2, estrogen and progesterone receptor positive, HER-2 not amplified, with an MIB-1 of 15/20%  (3) consider neoadjuvant chemotherapy/consider Mammaprint depending on breast MRI results  (4) definitive surgery pending  (5) adjuvant radiation to follow  ( 6) start antiestrogens at the completion of local treatment  PLAN: We spent the better part of today's hour-long appointment discussing the biology of her diagnosis and the specifics of her situation. We first reviewed the fact that cancer is not one disease but more than 100 different diseases and that it is important to keep them separate-- otherwise when friends and relatives discuss their own cancer experiences with [facility] confusion can result. Similarly we explained that if breast cancer spreads to the bone or liver, the patient would not have bone cancer or liver cancer, but breast cancer in the bone and breast cancer in the liver: one cancer in three places-- not 3 different cancers which otherwise would have to be treated in 3 different ways.  We discussed the difference between local and systemic therapy. In terms of loco-regional treatment, lumpectomy plus radiation is equivalent to mastectomy as far as survival is concerned. For this reason, and because the cosmetic results are generally superior, we recommend breast conserving surgery. We also noted that in terms of sequencing of treatments, whether systemic therapy or surgery is done first  does not affect the ultimate outcome.  Next we went over the options for systemic therapy which are anti-estrogens, anti-HER-2 immunotherapy, and chemotherapy. Samya does not meet criteria for anti-HER-2 immunotherapy. She is a good candidate for anti-estrogens.  The question of chemotherapy is more complicated. Chemotherapy is most effective in rapidly growing, aggressive tumors. It is much less effective in not high-grade, not fast growing cancers, like  Ryah 's. For that reason we can consider obtaining a Mammaprint to assess the possible risk with or without chemotherapy.  On the other hand she does have a large tumor with a positive node and it would not be incorrect to proceed directly to chemotherapy and "are on the side of too much rather than too little" treatment, to optimize the chance of cure. At this point that is my bias (that she will benefit from chemotherapy)  She is set up for breast MRI later this week.  Depending on that result we will make a definitive decision regarding Mammaprint.  If we do proceed with chemotherapy as currently planned she would receive 4 cycles of cyclophosphamide and Taxotere given every 21 days, starting most likely right after Christmas or early in January  Genifer has a good understanding of the overall plan. She agrees with it. She knows the goal of treatment in her case is cure. She will call with any problems that may develop before her next visit here.  Chauncey Cruel, MD   02/01/2017 5:19 PM Medical Oncology and Hematology [facility] [address] [place], [place] [address] Tel. [phone]    Fax. [phone]

## 2017-02-01 NOTE — Progress Notes (Signed)
Radiation Oncology         [phone] ________________________________  Name: Brooke [name]        MRN: [id number]  Date of Service: 02/01/2017 DOB: 30-Sep-1950  CC:[name], Brooke Major, DO  Brooke Kussmaul, MD     REFERRING PHYSICIAN: Autumn [name] III, MD   DIAGNOSIS: The encounter diagnosis was Malignant neoplasm of overlapping sites of left breast in female, estrogen receptor positive ([place]).   HISTORY OF PRESENT ILLNESS: Brooke [name] is a 66 y.o. female seen in the multidisciplinary breast clinic for a new diagnosis of left breast cancer. She was noted to have a mass in the left brast for about 1 week. She underwent diagnostic imaging and this revealed a 3 cm mass at 11;00 and had contiguous changes which was originally felt to be a satellite lesion but subsequent diagnostic images revealed this as one mass. She had three abnormal appearing lymph nodes on ultrasound. Within the right breast there was a 1 cm group of calcifications. A biopsy on 01/26/17 of the right breast revealed a complex sclerosing lesion, and the left breast biopsies, two of which were obtained both revealed a grade 2 invasive ductal carcinoma, and one of her nodes were sampled and was consistent with disease. Her tumor as ER/PR positive, HER2 negative, and Ki 67 of 20%. She comes today to discuss options of treatment for her cancer.   PREVIOUS RADIATION THERAPY: No   PAST MEDICAL HISTORY:  Past Medical History:  Diagnosis Date  . Arthritis   . Bilateral dry eyes   . Cancer ([place])   . Fibroid   . Hyperlipidemia   . Kidney stones   . Melanoma of upper arm ([place]) 2009   RIGHT ARM   . Osteopenia 05/2015   T score -1.3 FRAX 6%/0.5% stable from prior DEXA  . UTI (urinary tract infection)   . Vitamin D deficiency 07/2009   LOW VITAMIN D 29  . Vitamin deficiency 07/2008   VITAMIN D LOW 23       PAST SURGICAL HISTORY: Past Surgical History:  Procedure Laterality Date  . ABDOMINAL SURGERY  1975   ovairan cyst   .  CHOLECYSTECTOMY    . DERMOID CYST  EXCISION    . DILATION AND CURETTAGE OF UTERUS  1998  . GALLBLADDER SURGERY    . HYSTEROSCOPY    . SKIN BIOPSY    . SKIN CANCER EXCISION  2010,2011   2010-RIGHT ARM, 2011-LEFT LOWER LEG.-DR Rolm Bookbinder DERM. DR. Sarajane Jews IS HER DERM SURGEON.     FAMILY HISTORY:  Family History  Problem Relation Age of Onset  . Diabetes Father   . Hypertension Father   . Heart disease Father   . Prostate cancer Father   . Breast cancer Sister        Age 47's  . Skin cancer Sister   . Colon cancer Paternal Grandfather   . Hypertension Mother      SOCIAL HISTORY:  reports that  has never smoked. she has never used smokeless tobacco. She reports that she drinks alcohol. She reports that she does not use drugs. The patient is single and lives in [place]. She works for [employer].    ALLERGIES: Benzalkonium chloride; Latex; and Neosporin [neomycin-bacitracin zn-polymyx]   MEDICATIONS:  Current Outpatient Medications  Medication Sig Dispense Refill  . alclomethasone (ACLOVATE) 0.05 % cream APPLY TO ONLY THE AFFECTED AREA ON THE SKIN PRN  0  . XIIDRA 5 % SOLN Place 1 drop into  both eyes daily.   1   No current facility-administered medications for this encounter.      REVIEW OF SYSTEMS: On review of systems, the patient reports that she is doing well overall. She denies any chest pain, shortness of breath, cough, fevers, chills, night sweats, unintended weight changes. She denies any bowel or bladder disturbances, and denies abdominal pain, nausea or vomiting. She denies any new musculoskeletal or joint aches or pains. A complete review of systems is obtained and is otherwise negative.     PHYSICAL EXAM:  Wt Readings from Last 3 Encounters:  02/01/17 231 lb 9.6 oz (105.1 kg)  01/13/17 238 lb 9.6 oz (108.2 kg)  12/16/16 245 lb 4.8 oz (111.3 kg)   Temp Readings from Last 3 Encounters:  02/01/17 98.5 F (36.9 C) (Oral)  01/13/17 98.2 F (36.8 C) (Oral)    12/16/16 97.7 F (36.5 C) (Oral)   BP Readings from Last 3 Encounters:  02/01/17 (!) 151/90  01/13/17 122/88  12/16/16 128/82   Pulse Readings from Last 3 Encounters:  02/01/17 69  01/13/17 72  12/16/16 64     In general this is a well appearing caucasian female in no acute distress. She is alert and oriented x4 and appropriate throughout the examination. HEENT reveals that the patient is normocephalic, atraumatic. EOMs are intact. PERRLA. Skin is intact without any evidence of gross lesions. Cardiovascular exam reveals a regular rate and rhythm, no clicks rubs or murmurs are auscultated. Chest is clear to auscultation bilaterally. Lymphatic assessment is performed and does not reveal any adenopathy in the cervical, supraclavicular, axillary, or inguinal chains. Bilateral breast exam is performed and reveals ecchymoses of bilateral breasts at the site of her biopsies. No palpable mass is noted in the right breast. The left breast reveals fullness at the 11:00 site consistent with her known cancer. She does not have nipple bleeding or discharge noted.  Abdomen has active bowel sounds in all quadrants and is intact. The abdomen is soft, non tender, non distended. Lower extremities are negative for pretibial pitting edema, deep calf tenderness, cyanosis or clubbing.   ECOG =1  0 - Asymptomatic (Fully active, able to carry on all predisease activities without restriction)  1 - Symptomatic but completely ambulatory (Restricted in physically strenuous activity but ambulatory and able to carry out work of a light or sedentary nature. For example, light housework, office work)  2 - Symptomatic, <50% in bed during the day (Ambulatory and capable of all self care but unable to carry out any work activities. Up and about more than 50% of waking hours)  3 - Symptomatic, >50% in bed, but not bedbound (Capable of only limited self-care, confined to bed or chair 50% or more of waking hours)  4 -  Bedbound (Completely disabled. Cannot carry on any self-care. Totally confined to bed or chair)  5 - Death   Eustace Pen MM, Creech RH, Tormey DC, et al. [phone]). "Toxicity and response criteria of the [facility] Group". [place] Oncol. 5 (6): 649-55    LABORATORY DATA:  Lab Results  Component Value Date   WBC 7.1 02/01/2017   HGB 13.7 02/01/2017   HCT 41.5 02/01/2017   MCV 88.9 02/01/2017   PLT 302 02/01/2017   Lab Results  Component Value Date   NA 138 02/01/2017   K 3.9 02/01/2017   CL 101 12/16/2016   CO2 23 02/01/2017   Lab Results  Component Value Date   ALT 15 02/01/2017  AST 19 02/01/2017   ALKPHOS 76 02/01/2017   BILITOT 0.85 02/01/2017      RADIOGRAPHY: No results found.     IMPRESSION/PLAN: 1. Stage IIA, cT2N1M0 grade 2, ER/PR positive invasive ductal carcinoma of the left breast.  Dr. Lisbeth Renshaw discusses the pathology findings and reviews the nature of invasive breast disease. The consensus from the breast conference includes proceeding with MRI of the breast followed by neoadjuvant chemotherapy. Her surgical approach is yet to be determined based on her desires and response to treatment. The hope is to proceed with lumpectomy and either targeted node dissection or axillary node dissection. Given the nodal involvement initially, Dr. Lisbeth Renshaw recommends adjuvant radiotherapy to the breast/chest wall and regional nodes. Her course would also be followed by antiestrogen medication. We discussed the risks, benefits, short, and long term effects of radiotherapy, and the patient is interested in proceeding. Dr. Lisbeth Renshaw discusses the delivery and logistics of radiotherapy and anticipates a course of 6 1/2 weeks. We will see her back about 2 weeks after surgery to move forward with the simulation and planning process and anticipate starting radiotherapy about 4 weeks after surgery.  2. Right complex sclerosing lesion. This will be surgically excised at the time of her  surgery, but will also be further evaluated on her staging MRI. 3. Possible genetic predisposition to malignancy. She has been counseled on the role of genetic testing and will be seen to discuss testing. 4. Right rib pain. This will be imaged during her MRI. We will follow this expectantly.  The above documentation reflects my direct findings during this shared patient visit. Please see the separate note by Dr. Lisbeth Renshaw on this date for the remainder of the patient's plan of care.    Carola Rhine, PAC

## 2017-02-02 ENCOUNTER — Encounter: Payer: No Typology Code available for payment source | Admitting: Genetics

## 2017-02-02 ENCOUNTER — Ambulatory Visit: Payer: Self-pay | Admitting: General Surgery

## 2017-02-02 ENCOUNTER — Encounter: Payer: Self-pay | Admitting: Physical Therapy

## 2017-02-02 ENCOUNTER — Ambulatory Visit: Payer: BC Managed Care – PPO | Admitting: Gynecology

## 2017-02-02 ENCOUNTER — Encounter: Payer: Self-pay | Admitting: Genetics

## 2017-02-02 ENCOUNTER — Encounter: Payer: Self-pay | Admitting: Gynecology

## 2017-02-02 ENCOUNTER — Other Ambulatory Visit: Payer: No Typology Code available for payment source

## 2017-02-02 VITALS — BP 120/74

## 2017-02-02 DIAGNOSIS — Z170 Estrogen receptor positive status [ER+]: Secondary | ICD-10-CM

## 2017-02-02 DIAGNOSIS — Z8042 Family history of malignant neoplasm of prostate: Secondary | ICD-10-CM

## 2017-02-02 DIAGNOSIS — C50912 Malignant neoplasm of unspecified site of left female breast: Secondary | ICD-10-CM

## 2017-02-02 DIAGNOSIS — Z800 Family history of malignant neoplasm of digestive organs: Secondary | ICD-10-CM | POA: Insufficient documentation

## 2017-02-02 DIAGNOSIS — Z803 Family history of malignant neoplasm of breast: Secondary | ICD-10-CM

## 2017-02-02 HISTORY — DX: Family history of malignant neoplasm of digestive organs: Z80.0

## 2017-02-02 HISTORY — DX: Family history of malignant neoplasm of prostate: Z80.42

## 2017-02-02 HISTORY — DX: Family history of malignant neoplasm of breast: Z80.3

## 2017-02-02 NOTE — Patient Instructions (Signed)
Follow-up with [facility] for ongoing care

## 2017-02-02 NOTE — Progress Notes (Deleted)
REFERRING PROVIDER: Chauncey Cruel, MD [place], [place] [address]  PRIMARY PROVIDER:  [name], DO  PRIMARY REASON FOR VISIT:  1. Malignant neoplasm of overlapping sites of left breast in female, estrogen receptor positive ([place])   2. Family history of breast cancer   3. Family history of colon cancer   4. Family history of prostate cancer in father     HISTORY OF PRESENT ILLNESS:   [name], a 66 y.o. female, was seen for a [place] cancer genetics consultation at the request of Dr. [name] due to a personal and family history of cancer.  [name] presents to clinic today to discuss the possibility of a hereditary predisposition to cancer, genetic testing, and to further clarify her future cancer risks, as well as potential cancer risks for family members.   In November 2018, at the age of 58, [name] was diagnosed with grade 2 invasive ductal carcinoma of the left breast- estrogen and progesterone receptors both 100% positive, with strong staining intensity, There was no HER-2 amplification.   She is currently considering treatment options as this time.  [name] also has a history of melanoma on her right upper arm (2009) and skin cancer on her lower leg (2011).   HORMONAL RISK FACTORS:  Menarche was at age 2.  First live birth at age N/A.  OCP use for approximately {Numbers 1-12 multi-select:20307} years.  Ovaries intact: {Yes/No-Ex:120004}.  Hysterectomy: {Yes/No-Ex:120004}.  Menopausal status: postmenopausal. Stopped having periods in 1999 HRT use: 5 years. Colonoscopy: {Yes/No-Ex:120004}; {normal/abnormal/not examined:14677}. Mammogram within the last year: yes.  Past Medical History:  Diagnosis Date  . Arthritis   . Bilateral dry eyes   . Cancer ([place])   . Family history of breast cancer 02/02/2017  . Family history of colon cancer 02/02/2017  . Family history of prostate cancer in father 02/02/2017  . Fibroid   .  Hyperlipidemia   . Kidney stones   . Melanoma of upper arm ([place]) 2009   RIGHT ARM   . Osteopenia 05/2015   T score -1.3 FRAX 6%/0.5% stable from prior DEXA  . UTI (urinary tract infection)   . Vitamin D deficiency 07/2009   LOW VITAMIN D 29  . Vitamin deficiency 07/2008   VITAMIN D LOW 23    Past Surgical History:  Procedure Laterality Date  . ABDOMINAL SURGERY  1975   ovairan cyst   . CHOLECYSTECTOMY    . DERMOID CYST  EXCISION    . DILATION AND CURETTAGE OF UTERUS  1998  . GALLBLADDER SURGERY    . HYSTEROSCOPY    . SKIN BIOPSY    . SKIN CANCER EXCISION  2010,2011   2010-RIGHT ARM, 2011-LEFT LOWER LEG.-DR Rolm Bookbinder DERM. DR. Sarajane Jews IS HER DERM SURGEON.    Social History   Socioeconomic History  . Marital status: Single    Spouse name: Not on file  . Number of children: Not on file  . Years of education: Not on file  . Highest education level: Not on file  Social Needs  . Financial resource strain: Not on file  . Food insecurity - worry: Not on file  . Food insecurity - inability: Not on file  . Transportation needs - medical: Not on file  . Transportation needs - non-medical: Not on file  Occupational History  . Not on file  Tobacco Use  . Smoking status: Never Smoker  . Smokeless tobacco: Never Used  Substance and Sexual Activity  .  Alcohol use: No    Frequency: Never    Comment: rare  . Drug use: No  . Sexual activity: No    Birth control/protection: Post-menopausal  Other Topics Concern  . Not on file  Social History Narrative  . Not on file     FAMILY HISTORY:  We obtained a detailed, 4-generation family history.  Significant diagnoses are listed below: Family History  Problem Relation Age of Onset  . Diabetes Father   . Hypertension Father   . Heart disease Father   . Prostate cancer Father 65  . Breast cancer Sister 21       again at age 40, GT results unk  . Skin cancer Sister   . Colon cancer Paternal Grandfather 36  . Hypertension  Mother   . Heart failure Mother    [name] had 3 sisters ages ***.  One sister developed breast caner at age 7 and again at 40. She reports that she had genetic testing, but does not know the results of this testing.    [name] father was diagnosed with prostate cancer in his 35's and died at 64.    [name]'s mother died at 69 due to heart failure.  She had no history of cancer.    Patient's maternal ancestors are of *** descent, and paternal ancestors are of *** descent. There {IS NO:12509} reported Ashkenazi Jewish ancestry. There {IS NO:12509} known consanguinity.  GENETIC COUNSELING ASSESSMENT: SUHAYLAH WAMPOLE is a 66 y.o. female with a personal and family history which is somewhat suggestive of a Hereditary Cancer Predisposition Syndrome. We, therefore, discussed and recommended the following at today's visit.   DISCUSSION: We reviewed the characteristics, features and inheritance patterns of hereditary cancer syndromes. We also discussed genetic testing, including the appropriate family members to test, the process of testing, insurance coverage and turn-around-time for results. We discussed the implications of a negative, positive and/or variant of uncertain significant result. We recommended [name] pursue genetic testing for the *** gene panel.   ***We reviewed the characteristics, features and inheritance patterns of hereditary cancer syndromes. We also discussed genetic testing, including the appropriate family members to test, the process of testing, insurance coverage and turn-around-time for results. We discussed the implications of a negative, positive and/or variant of uncertain significant result. In order to get genetic test results in a timely manner so that [name] can use these genetic test results for surgical decisions, we recommended [name] pursue genetic testing for the ***. If this test is negative, we then recommend [name] pursue reflex genetic  testing to the *** gene panel.   We discussed that only 5-10% of cancers are associated with a Hereditary cancer predisposition syndrome.  One of the most common hereditary cancer syndromes that increases breast cancer risk is called Hereditary Breast and Ovarian Cancer (HBOC) syndrome.  This syndrome is caused by mutations in the BRCA1 and BRCA2 genes.  This syndrome increases an individual's lifetime risk to develop breast, ovarian, pancreatic, and other types of cancer.  There are also many other cancer predisposition syndromes caused by mutations in several other genes.  We discussed that if she is found to have a mutation in one of these genes, it may impact surgical decisions, and alter future medical management recommendations such as increased cancer screenings and consideration of risk reducing surgeries.  A positive result could also have implications for the patient's family members.  A Negative result would mean we were unable to  identify a hereditary component to her cancer, but does not rule out the possibility of a hereditary basis for her cancer.  There could be mutations that are undetectable by current technology, or in genes not yet tested or identified to increase cancer risk.    We discussed the potential to find a Variant of Uncertain Significance or VUS.  These are variants that have not yet been identified as pathogenic or benign, and it is unknown if this variant is associated with increased cancer risk or if this is a normal finding.  Most VUS's are reclassified to benign or likely benign.   It should not be used to make medical management decisions. With time, we suspect the lab will determine the significance of any VUS's identified if any.   Based on Ms. Giza's {Personal/family:20331} history of cancer, she meets medical criteria for genetic testing. Despite that she meets criteria, she may still have an out of pocket cost. We discussed that if her out of pocket cost for  testing is over $100, the laboratory will call and confirm whether she wants to proceed with testing.  If the out of pocket cost of testing is less than $100 she will be billed by the genetic testing laboratory.   PLAN: After considering the risks, benefits, and limitations, Ms. Raigoza  provided informed consent to pursue genetic testing and the blood sample was sent to {Lab} Laboratories for analysis of the {test}. Results should be available within approximately {TAT TIME} weeks' time, at which point they will be disclosed by telephone to Ms. Cutshaw, as will any additional recommendations warranted by these results. Ms. Ludwick will receive a summary of her genetic counseling visit and a copy of her results once available. This information will also be available in Epic. We encouraged Ms. Ey to remain in contact with cancer genetics annually so that we can continuously update the family history and inform her of any changes in cancer genetics and testing that may be of benefit for her family. Ms. Mcguirt questions were answered to her satisfaction today. Our contact information was provided should additional questions or concerns arise.  *** Despite our recommendation, Ms. Greenleaf did not wish to pursue genetic testing at today's visit. We understand this decision, and remain available to coordinate genetic testing at any time in the future. We; therefore, recommend Ms. Bouwens continue to follow the cancer screening guidelines given by her primary healthcare provider.  ***Based on Ms. Banfield's family history, we recommended her sister, who was diagnosed with *** at age ***, have genetic counseling and testing. Ms. Sermons will let us know if we can be of any assistance in coordinating genetic counseling and/or testing for this family member.   Lastly, we encouraged Ms. Breck to remain in contact with cancer genetics annually so that we can continuously update the family history and inform her  of any changes in cancer genetics and testing that may be of benefit for this family.   Ms.  Rothschild questions were answered to her satisfaction today. Our contact information was provided should additional questions or concerns arise. Thank you for the referral and allowing [place] to share in the care of your patient.   Tana Felts, MS Genetic Counselor [email or web address] phone: [phone]  The patient was seen for a total of *** minutes in face-to-face genetic counseling.  The patient was accompanied today by her *** This patient was discussed with Drs. Magrinat, Lindi Adie and/or Burr Medico who agrees with the above.

## 2017-02-02 NOTE — Progress Notes (Signed)
    ARWA YERO May 25, 1950 [id number]        66 y.o.  G0P0000 presents having recently been diagnosed with stage II breast cancer positive lymph node involvement.  Has met with the interdisciplinary committee meeting with planned strategy to include chemotherapy first and then subsequent surgery which may involve lumpectomy versus mastectomy and lymph node surgery.  Patient has many questions that she wanted to ask me having been my patient for a number of years and wanted my input.  Past medical history,surgical history, problem list, medications, allergies, family history and social history were all reviewed and documented in the EPIC chart.  Directed ROS with pertinent positives and negatives documented in the history of present illness/assessment and plan.  Exam: Vitals:   02/02/17 1017  BP: 120/74   General appearance:  Normal  Assessment/Plan:  66 y.o. G0P0000 with recent diagnoses of stage II breast cancer.  Under the care of Dr. [name] and Dr. Marlou Starks for planned chemotherapy followed by surgery.  She is concerned because she has no support locally with a sister who lives in [place].  She was asking about whether she should transfer care to [facility] and be treated down there versus staying here.  We discussed that I have no experience with the care in [place] which may be very good but I reassured her that I feel the care locally is great and she is being taken care of by a great team here.  I suggested that she initiate her chemotherapy here and discuss with the cancer center whether she can arrange for follow-up chemotherapy in [place] after assuring response here and to continue administration in [place] if they would allow for that where her sister will be there to support her and then return here as needed for monitoring and ultimate surgery.  She is going to discuss this with the cancer center.  We discussed in general what to expect with her chemotherapy and  treatments.  She will call me if she has any questions or confusion about what is going on with her care that I could help her with but again I reassured her that she is under great care at [facility] particularly with Dr. [name] and Dr. Marlou Starks.  Greater than 50% of my time was spent in direct face to face counseling and coordination of care with the patient.     Anastasio Auerbach MD, 12:10 PM 02/02/2017

## 2017-02-02 NOTE — Therapy (Signed)
[place], [place], [address] Phone: [phone]   Fax:  [phone]  Physical Therapy Evaluation  Patient Details  Name: Brooke [name] MRN: [id number] Date of Birth: Oct 13, 1950 Referring Provider: Dr. Autumn Messing   Encounter Date: 02/01/2017  PT End of Session - 02/02/17 1045    Visit Number  1    Number of Visits  1    PT Start Time  9826    PT Stop Time  1448 Also saw pt from 1525-1531 for a total of 32 minutes    PT Time Calculation (min)  26 min    Activity Tolerance  Patient tolerated treatment well    Behavior During Therapy  [facility] for tasks assessed/performed       Past Medical History:  Diagnosis Date  . Arthritis   . Bilateral dry eyes   . Cancer ([place])   . Family history of breast cancer 02/02/2017  . Family history of colon cancer 02/02/2017  . Family history of prostate cancer in father 02/02/2017  . Fibroid   . Hyperlipidemia   . Kidney stones   . Melanoma of upper arm ([place]) 2009   RIGHT ARM   . Osteopenia 05/2015   T score -1.3 FRAX 6%/0.5% stable from prior DEXA  . UTI (urinary tract infection)   . Vitamin D deficiency 07/2009   LOW VITAMIN D 29  . Vitamin deficiency 07/2008   VITAMIN D LOW 23    Past Surgical History:  Procedure Laterality Date  . ABDOMINAL SURGERY  1975   ovairan cyst   . CHOLECYSTECTOMY    . DERMOID CYST  EXCISION    . DILATION AND CURETTAGE OF UTERUS  1998  . GALLBLADDER SURGERY    . HYSTEROSCOPY    . SKIN BIOPSY    . SKIN CANCER EXCISION  2010,2011   2010-RIGHT ARM, 2011-LEFT LOWER LEG.-DR Rolm Bookbinder DERM. DR. Sarajane Jews IS HER DERM SURGEON.    There were no vitals filed for this visit.   Subjective Assessment - 02/02/17 1037    Subjective  Patient reports she is here to be seen by her medical team for her newly diagnosed left breast cancer.    Pertinent History  Patient was diagnosed on 01/18/17 with left grade 2 invasive ductal carcinoma breast  cancer. It measures 3 cm and is located in the upper inner quadrant. It is ER/PR positive and HER2 negative with a Ki67 of 20%. She has 3 abnormal axillary nodes on ultrasound, 1 was biopsied and found to be positive for cancer. She also has an area of right breast calcifications that are a complex sclerosing lesion.    Patient Stated Goals  Reduce lymphedema risk and learn post op shoulder ROM HEP    Currently in Pain?  Yes    Pain Score  5     Pain Location  Rib cage    Pain Orientation  Right    Pain Descriptors / Indicators  Aching    Pain Type  Acute pain    Pain Onset  Yesterday Happened when she tried to lay prone for MRI    Pain Frequency  Intermittent    Aggravating Factors   Laying prone    Pain Relieving Factors  Unknown    Multiple Pain Sites  No         [facility] PT Assessment - 02/02/17 0001      Assessment   Medical Diagnosis  Left breast cancer  Referring Provider  Dr. Autumn Messing    Onset Date/Surgical Date  01/18/17    Hand Dominance  Left    Prior Therapy  none      Precautions   Precautions  Other (comment)    Precaution Comments  active cancer      Restrictions   Weight Bearing Restrictions  No      Balance Screen   Has the patient fallen in the past 6 months  No    Has the patient had a decrease in activity level because of a fear of falling?   No    Is the patient reluctant to leave their home because of a fear of falling?   No      Home Environment   Living Environment  Private residence    Living Arrangements  Alone    Available Help at Discharge  Family      Prior Function   Level of Independence  Independent    Vocation  Full time employment    Vocation Requirements  office work    Leisure  Does 45 min cardio 3x/week      Cognition   Overall Cognitive Status  Within Functional Limits for tasks assessed      Posture/Postural Control   Posture/Postural Control  Postural limitations    Postural Limitations  Rounded Shoulders;Forward head       ROM / Strength   AROM / PROM / Strength  AROM;Strength      AROM   AROM Assessment Site  Shoulder;Cervical    Right/Left Shoulder  Right;Left    Right Shoulder Extension  42 Degrees    Right Shoulder Flexion  152 Degrees    Right Shoulder ABduction  154 Degrees    Right Shoulder Internal Rotation  65 Degrees    Right Shoulder External Rotation  75 Degrees    Left Shoulder Extension  44 Degrees    Left Shoulder Flexion  136 Degrees    Left Shoulder ABduction  142 Degrees    Left Shoulder Internal Rotation  70 Degrees    Left Shoulder External Rotation  81 Degrees    Cervical Flexion  WNL    Cervical Extension  WNL    Cervical - Right Side Bend  WNL    Cervical - Left Side Bend  WNL    Cervical - Right Rotation  WNL    Cervical - Left Rotation  WNL      Strength   Overall Strength  Within functional limits for tasks performed        LYMPHEDEMA/ONCOLOGY QUESTIONNAIRE - 02/02/17 1043      Type   Cancer Type  Left breast cancer      Lymphedema Assessments   Lymphedema Assessments  Upper extremities      Right Upper Extremity Lymphedema   10 cm Proximal to Olecranon Process  31.9 cm    Olecranon Process  29.5 cm    10 cm Proximal to Ulnar Styloid Process  26 cm    Just Proximal to Ulnar Styloid Process  18.5 cm    Across Hand at [employer]  19.5 cm    At [place] of 2nd Digit  6.8 cm      Left Upper Extremity Lymphedema   10 cm Proximal to Olecranon Process  31.7 cm    Olecranon Process  29.3 cm    10 cm Proximal to Ulnar Styloid Process  24.2 cm    Just Proximal to Ulnar Styloid Process  17.4 cm    Across Hand at [employer]  19.4 cm    At [place] of 2nd Digit  6.7 cm          Objective measurements completed on examination: See above findings.         Patient was instructed today in a home exercise program today for post op shoulder range of motion. These included active assist shoulder flexion in sitting, scapular retraction, wall walking with  shoulder abduction, and hands behind head external rotation.  She was encouraged to do these twice a day, holding 3 seconds and repeating 5 times when permitted by her physician.         PT Education - 02/02/17 1043    Education provided  Yes    Education Details  Lymphedema risk reduction and post op shoulder ROM HEP    Person(s) Educated  Patient    Methods  Explanation;Demonstration;Handout    Comprehension  Returned demonstration;Verbalized understanding           Breast Clinic Goals - 02/02/17 1053      Patient will be able to verbalize understanding of pertinent lymphedema risk reduction practices relevant to her diagnosis specifically related to skin care.   Time  1    Period  Days    Status  Achieved      Patient will be able to return demonstrate and/or verbalize understanding of the post-op home exercise program related to regaining shoulder range of motion.   Time  1    Period  Days    Status  Achieved      Patient will be able to verbalize understanding of the importance of attending the postoperative After Breast Cancer Class for further lymphedema risk reduction education and therapeutic exercise.   Time  1    Period  Days    Status  Achieved            Plan - 02/02/17 1046    Clinical Impression Statement  Patient was diagnosed on 01/18/17 with left grade 2 invasive ductal carcinoma breast cancer. It measures 3 cm and is located in the upper inner quadrant. It is ER/PR positive and HER2 negative with a Ki67 of 20%. She has 3 abnormal axillary nodes on ultrasound, 1 was biopsied and found to be positive for cancer. She also has an area of right breast calcifications that are a complex sclerosing lesion. Her multidisciplinary medical team met prior to her assessments to determine a recommended treatment plan. She is planning to have an MRI, genetic testing, a Mammaprint, likely neoadjuvant chemotherapy, a left lumpectomy with a targeted axillary node  dissection, radiation, and anti-estrogen therapy. She will benefit from a post op PT visit to reassess and determine needs.    History and Personal Factors relevant to plan of care:  none    Clinical Presentation  Evolving    Clinical Presentation due to:  Unknown extent of disease    Clinical Decision Making  Low    Rehab Potential  Excellent    Clinical Impairments Affecting Rehab Potential  none    PT Frequency  One time visit    PT Treatment/Interventions  ADLs/Self Care Home Management;Therapeutic exercise;Patient/family education    PT Next Visit Plan  Will f/u after surgery to reassess and determine needs    PT Home Exercise Plan  Post op shoulder ROM HEP    Consulted and Agree with Plan of Care  Patient       Patient will  benefit from skilled therapeutic intervention in order to improve the following deficits and impairments:  Pain, Impaired UE functional use, Postural dysfunction, Decreased range of motion, Decreased knowledge of precautions  Visit Diagnosis: Malignant neoplasm of upper-inner quadrant of left breast in female, estrogen receptor positive ([place]) - Plan: PT plan of care cert/re-cert  Abnormal posture - Plan: PT plan of care cert/re-cert   Patient will follow up at outpatient cancer rehab 3-4 weeks following surgery.  If the patient requires physical therapy at that time, a specific plan will be dictated and sent to the referring physician for approval. The patient was educated today on appropriate basic range of motion exercises to begin post operatively and the importance of attending the After Breast Cancer class following surgery.  Patient was educated today on lymphedema risk reduction practices as it pertains to recommendations that will benefit the patient immediately following surgery.  She verbalized good understanding.      Problem List Patient Active Problem List   Diagnosis Date Noted  . Family history of breast cancer 02/02/2017  . Family history of  colon cancer 02/02/2017  . Family history of prostate cancer in father 02/02/2017  . Malignant neoplasm of overlapping sites of left breast in female, estrogen receptor positive ([place]) 01/31/2017  . Hx of hot flashes, menopausal, HRT in the past 12/19/2011  . Osteopenia, stable, DEXA 2013, recs to repeat in 2018 12/19/2011  . Hx of Hyperlipidemia, LDL 144 in 07/2011 12/19/2011  . Vitamin D insufficiency 12/19/2011  . Fibroid     Annia Friendly, [name] 02/02/17 10:55 AM   [place], [place], [address] Phone: [phone]   Fax:  [phone]  Name: DENEAN PAVON MRN: [id number] Date of Birth: Jan 19, 1951

## 2017-02-02 NOTE — Patient Instructions (Signed)

## 2017-02-03 ENCOUNTER — Telehealth: Payer: Self-pay | Admitting: *Deleted

## 2017-02-03 NOTE — Telephone Encounter (Signed)
  Oncology Nurse Navigator Documentation  Navigator Location: [facility] (02/03/17 1100)   )Navigator Encounter Type: Telephone;MDC Follow-up (02/03/17 1100) Telephone: Outgoing Call;Clinic/MDC Follow-up (02/03/17 1100)                                                  Time Spent with Patient: 15 (02/03/17 1100)

## 2017-02-04 ENCOUNTER — Ambulatory Visit
Admission: RE | Admit: 2017-02-04 | Discharge: 2017-02-04 | Disposition: A | Discharge status: Home / Self Care | Payer: BC Managed Care – PPO | Source: Ambulatory Visit | Attending: Radiology | Admitting: Radiology

## 2017-02-04 DIAGNOSIS — N6311 Unspecified lump in the right breast, upper outer quadrant: Secondary | ICD-10-CM

## 2017-02-04 DIAGNOSIS — N6322 Unspecified lump in the left breast, upper inner quadrant: Secondary | ICD-10-CM

## 2017-02-04 MED ORDER — GADOBENATE DIMEGLUMINE 529 MG/ML IV SOLN
20.0000 mL | Freq: Once | INTRAVENOUS | Status: AC | PRN
  Administered 2017-02-04: 20 mL via INTRAVENOUS

## 2017-02-06 ENCOUNTER — Other Ambulatory Visit: Payer: Self-pay | Admitting: Oncology

## 2017-02-07 ENCOUNTER — Telehealth: Payer: Self-pay | Admitting: *Deleted

## 2017-02-07 ENCOUNTER — Other Ambulatory Visit (HOSPITAL_COMMUNITY): Payer: No Typology Code available for payment source

## 2017-02-07 ENCOUNTER — Encounter: Payer: Self-pay | Admitting: *Deleted

## 2017-02-07 ENCOUNTER — Encounter (HOSPITAL_COMMUNITY): Payer: Self-pay

## 2017-02-07 ENCOUNTER — Other Ambulatory Visit: Payer: BC Managed Care – PPO

## 2017-02-07 ENCOUNTER — Other Ambulatory Visit: Payer: Self-pay | Admitting: Oncology

## 2017-02-07 NOTE — Progress Notes (Unsigned)
Tezra's MRI shows a T3 lesion with at least 3 normal lymph nodes.  Reviewing the results of the MIND act study, there were approximately 70 patients out of more than 6000 with T3 lesions.  I think she is borderline at best as far as inclusion in that study is concerned and we are not going to send a Mammaprint.  Instead we are proceeding directly to chemotherapy.  She will see me later this week to discuss the details.

## 2017-02-07 NOTE — Telephone Encounter (Signed)
No additional notes

## 2017-02-08 ENCOUNTER — Ambulatory Visit (HOSPITAL_BASED_OUTPATIENT_CLINIC_OR_DEPARTMENT_OTHER): Payer: BC Managed Care – PPO | Admitting: Oncology

## 2017-02-08 ENCOUNTER — Telehealth: Payer: Self-pay | Admitting: Oncology

## 2017-02-08 VITALS — BP 139/75 | HR 92 | Temp 98.5°F | Resp 18 | Ht 69.0 in | Wt 230.4 lb

## 2017-02-08 DIAGNOSIS — C50812 Malignant neoplasm of overlapping sites of left female breast: Secondary | ICD-10-CM

## 2017-02-08 DIAGNOSIS — Z170 Estrogen receptor positive status [ER+]: Secondary | ICD-10-CM | POA: Diagnosis not present

## 2017-02-08 MED ORDER — LIDOCAINE-PRILOCAINE 2.5-2.5 % EX CREA: TOPICAL_CREAM | CUTANEOUS | 3 refills | Status: DC

## 2017-02-08 MED ORDER — PROCHLORPERAZINE MALEATE 10 MG PO TABS: 10.0000 mg | ORAL_TABLET | Freq: Four times a day (QID) | ORAL | 1 refills | Status: DC | PRN

## 2017-02-08 MED ORDER — DEXAMETHASONE 4 MG PO TABS: 8.0000 mg | ORAL_TABLET | Freq: Two times a day (BID) | ORAL | 1 refills | Status: DC

## 2017-02-08 MED ORDER — LORAZEPAM 0.5 MG PO TABS: 0.5000 mg | ORAL_TABLET | Freq: Every evening | ORAL | 0 refills | Status: DC | PRN

## 2017-02-08 NOTE — Telephone Encounter (Signed)
Gave patient avs and calendar with appts per 12/12 los.  °

## 2017-02-08 NOTE — Progress Notes (Signed)
[facility]  Telephone:([phone]) [phone] [phone]     ID: Brooke [name] DOB: 1950/08/16  MR#: [id number]  UMP#:[id number]  Patient Care Team: [name], DO as PCP - General (Family Medicine) Phineas Real, Belinda Block, MD (Obstetrics and Gynecology) Rolm Bookbinder, MD as Attending Physician (Dermatology) Allyn Bertoni, Virgie Dad, MD as Consulting Physician (Oncology) Kyung Rudd, MD as Consulting Physician (Radiation Oncology) Jovita Kussmaul, MD as Consulting Physician (General Surgery) OTHER MD:  CHIEF COMPLAINT: Estrogen receptor positive breast cancer  CURRENT TREATMENT: Neoadjuvant chemotherapy   HISTORY OF CURRENT ILLNESS: From the original intake note:  Brooke [name] herself palpated a change in her left breast and brought this to medical attention.  On 01/18/2017 she underwent bilateral diagnostic mammography with tomography and bilateral breast ultrasonography at [facility].  The breast density was category B.  At the most recent exam was from December 2016.  In the right breast upper outer quadrant there was a 1 cm irregular mass associated with punctate calcification.  By ultrasound this was not well-defined, and it was biopsied on 01/26/2017 with tomographic guidance this showed a complex sclerosing lesion with the usual ductal hyperplasia.  The right axilla was sonographically  On the left mammography showed a 4 cm dense mass with indistinct margins in the upper inner quadrant.  Ultrasound defined a 3 cm irregular mass in the upper inner quadrant of the left breast which was palpable.  There was an abnormal lymph node in the left axilla.  Biopsy of the left mass and abnormal lymph node ([id number], on 01/26/2017) showed both to be involved by invasive ductal carcinoma, with some lobular features but E-cadherin positive, estrogen and progesterone receptors both 100% positive, with strong staining intensity, with MIB-1 between 15-20%.  There was no HER-2 amplification, the  signals ratio being 1.31/1.58 and the number per cell 2.30/2.05  The patient's subsequent history is as detailed below.  INTERVAL HISTORY: Brooke [name] returns today for discussion and clarification of her upcoming treatment.  She met with our chemo instruction nurse and actually ended up with more questions after worse than she had before.  She is also concerned that she does not have a significant support group here.  Her sister in [place] has been asking her to consider moving there and getting treated there, or getting treated here and then traveling to [place] so her sister can take care of her there.  REVIEW OF SYSTEMS: Brooke [name] is concerned about mild epistaxis after going to her chemotherapy orientation. Since the age of 5 she has always had a little bit of blood when blowing her nose. After talking with her sister, who lives in [place], [place], the patient is wondering if she will be up to traveling to her sisters house so she can help take care of her. She is retiring at the end of the year and was planning on moving to [employer]. She denies unusual headaches, visual changes, nausea, vomiting, or dizziness. There has been no unusual cough, phlegm production, or pleurisy. This been no change in bowel or bladder habits. She denies unexplained fatigue or unexplained weight loss, bleeding, rash, or fever. A detailed review of systems was otherwise entirely stable.   PAST MEDICAL HISTORY: Past Medical History:  Diagnosis Date  . Arthritis   . Bilateral dry eyes   . Cancer ([place])   . Family history of breast cancer 02/02/2017  . Family history of colon cancer 02/02/2017  . Family history of prostate cancer in father 02/02/2017  . Fibroid   .  Hyperlipidemia   . Kidney stones   . Melanoma of upper arm ([place]) 2009   RIGHT ARM   . Osteopenia 05/2015   T score -1.3 FRAX 6%/0.5% stable from prior DEXA  . UTI (urinary tract infection)   . Vitamin D deficiency 07/2009   LOW VITAMIN D 29  . Vitamin  deficiency 07/2008   VITAMIN D LOW 23    PAST SURGICAL HISTORY: Past Surgical History:  Procedure Laterality Date  . ABDOMINAL SURGERY  1975   ovairan cyst   . CHOLECYSTECTOMY    . DERMOID CYST  EXCISION    . DILATION AND CURETTAGE OF UTERUS  1998  . GALLBLADDER SURGERY    . HYSTEROSCOPY    . SKIN BIOPSY    . SKIN CANCER EXCISION  2010,2011   2010-RIGHT ARM, 2011-LEFT LOWER LEG.-DR Rolm Bookbinder DERM. DR. Sarajane Jews IS HER DERM SURGEON.    FAMILY HISTORY Family History  Problem Relation Age of Onset  . Diabetes Father   . Hypertension Father   . Heart disease Father   . Prostate cancer Father 22  . Breast cancer Sister 44       again at age 19, GT results unk  . Skin cancer Sister   . Colon cancer Paternal Grandfather 59  . Hypertension Mother   . Heart failure Mother   The patient's father was diagnosed with prostate cancer in his 89s and died at age 44 from complications of that disease.  The patient's mother died at age 12 with congestive heart failure.  The patient had no brothers, 3 sisters.  One sister had breast cancer at age 105 and again at age 31.  She has been genetically tested but surprisingly the sister does not know her genetics testing results.  In addition a paternal grandfather had colon cancer in his 1s.  There is no history of ovarian cancer in the family to the patient's knowledge.  GYNECOLOGIC HISTORY:  Patient's last menstrual period was 03/18/1997. Menarche age 37. The patient is GX P0.  She stopped having periods in 1999 and took hormone replacement approximately 5 years.  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 She is single and lives alone with no pets.   ADVANCED DIRECTIVES:    HEALTH MAINTENANCE: Social History   Tobacco Use  . Smoking status: Never Smoker  . Smokeless tobacco: Never Used  Substance Use Topics  . Alcohol use:  No    Frequency: Never    Comment: rare  . Drug use: No     Colonoscopy: Never  PAP:  Bone density: 2017/osteopenia   Allergies  Allergen Reactions  . Benzalkonium Chloride   . Latex     rash  . Neosporin [Neomycin-Bacitracin Zn-Polymyx]     Current Outpatient Medications  Medication Sig Dispense Refill  . alclomethasone (ACLOVATE) 0.05 % cream APPLY TO ONLY THE AFFECTED AREA ON THE SKIN PRN  0  . B Complex Vitamins (VITAMIN-B COMPLEX PO) Take by mouth daily.    . Cholecalciferol (VITAMIN D3) LIQD by Does not apply route daily.    . vitamin C (ASCORBIC ACID) 500 MG tablet Take 500 mg by mouth daily.    Marland Kitchen VITAMIN E PO Take by mouth daily.    Marland Kitchen XIIDRA 5 % SOLN Place 1 drop into both eyes daily.   1   No current facility-administered medications for this visit.     OBJECTIVE:  Middle-aged white woman in no acute distress  Vitals:   02/08/17 1500  BP: 139/75  Pulse: 92  Resp: 18  Temp: 98.5 F (36.9 C)  SpO2: 97%     Body mass index is 34.02 kg/m.   Wt Readings from Last 3 Encounters:  02/08/17 230 lb 6.4 oz (104.5 kg)  02/01/17 231 lb 9.6 oz (105.1 kg)  01/13/17 238 lb 9.6 oz (108.2 kg)      ECOG FS:0 - Asymptomatic   LAB RESULTS:  CMP     Component Value Date/Time   NA 138 02/01/2017 1212   K 3.9 02/01/2017 1212   CL 101 12/16/2016 1046   CO2 23 02/01/2017 1212   GLUCOSE 88 02/01/2017 1212   BUN 9.2 02/01/2017 1212   CREATININE 0.8 02/01/2017 1212   CALCIUM 9.7 02/01/2017 1212   PROT 7.3 02/01/2017 1212   ALBUMIN 4.2 02/01/2017 1212   AST 19 02/01/2017 1212   ALT 15 02/01/2017 1212   ALKPHOS 76 02/01/2017 1212   BILITOT 0.85 02/01/2017 1212    No results found for: TOTALPROTELP, ALBUMINELP, A1GS, A2GS, BETS, BETA2SER, GAMS, MSPIKE, SPEI  No results found for: Nils Pyle, [facility]  Lab Results  Component Value Date   WBC 7.1 02/01/2017   NEUTROABS 4.9 02/01/2017   HGB 13.7 02/01/2017   HCT 41.5 02/01/2017   MCV 88.9  02/01/2017   PLT 302 02/01/2017      Chemistry      Component Value Date/Time   NA 138 02/01/2017 1212   K 3.9 02/01/2017 1212   CL 101 12/16/2016 1046   CO2 23 02/01/2017 1212   BUN 9.2 02/01/2017 1212   CREATININE 0.8 02/01/2017 1212      Component Value Date/Time   CALCIUM 9.7 02/01/2017 1212   ALKPHOS 76 02/01/2017 1212   AST 19 02/01/2017 1212   ALT 15 02/01/2017 1212   BILITOT 0.85 02/01/2017 1212       No results found for: LABCA2  No components found for: DPOEUM353  No results for input(s): INR in the last 168 hours.  No results found for: LABCA2  No results found for: IRW431  No results found for: VQM086  No results found for: PYP950  No results found for: CA2729  No components found for: HGQUANT  No results found for: CEA1 / No results found for: CEA1   No results found for: AFPTUMOR  No results found for: CHROMOGRNA  No results found for: PSA1  No visits with results within 3 Day(s) from this visit.  Latest known visit with results is:  Appointment on 02/01/2017  Component Date Value Ref Range Status  . Sodium 02/01/2017 138  136 - 145 mEq/L Final  . Potassium 02/01/2017 3.9  3.5 - 5.1 mEq/L Final  . Chloride 02/01/2017 106  98 - 109 mEq/L Final  . CO2 02/01/2017 23  22 - 29 mEq/L Final  . Glucose 02/01/2017 88  70 - 140 mg/dl Final   Glucose reference range is for nonfasting patients. Fasting glucose reference range is 70- 100.  Marland Kitchen BUN 02/01/2017 9.2  7.0 - 26.0 mg/dL Final  . Creatinine 02/01/2017 0.8  0.6 - 1.1 mg/dL Final  . Total Bilirubin 02/01/2017 0.85  0.20 - 1.20 mg/dL Final  . Alkaline Phosphatase 02/01/2017 76  40 - 150 U/L Final  . AST 02/01/2017 19  5 - 34 U/L Final  . ALT 02/01/2017 15  0 - 55 U/L Final  . Total Protein 02/01/2017 7.3  6.4 -  8.3 g/dL Final  . Albumin 02/01/2017 4.2  3.5 - 5.0 g/dL Final  . Calcium 02/01/2017 9.7  8.4 - 10.4 mg/dL Final  . Anion Gap 02/01/2017 9  3 - 11 mEq/L Final  . EGFR 02/01/2017 >60   >60 ml/min/1.73 m2 Final   eGFR is calculated using the CKD-EPI Creatinine Equation (2009)  . WBC 02/01/2017 7.1  3.9 - 10.3 10e3/uL Final  . NEUT# 02/01/2017 4.9  1.5 - 6.5 10e3/uL Final  . HGB 02/01/2017 13.7  11.6 - 15.9 g/dL Final  . HCT 02/01/2017 41.5  34.8 - 46.6 % Final  . Platelets 02/01/2017 302  145 - 400 10e3/uL Final  . MCV 02/01/2017 88.9  79.5 - 101.0 fL Final  . MCH 02/01/2017 29.3  25.1 - 34.0 pg Final  . MCHC 02/01/2017 33.0  31.5 - 36.0 g/dL Final  . RBC 02/01/2017 4.67  3.70 - 5.45 10e6/uL Final  . RDW 02/01/2017 13.4  11.2 - 14.5 % Final  . lymph# 02/01/2017 1.4  0.9 - 3.3 10e3/uL Final  . MONO# 02/01/2017 0.7  0.1 - 0.9 10e3/uL Final  . Eosinophils Absolute 02/01/2017 0.1  0.0 - 0.5 10e3/uL Final  . Basophils Absolute 02/01/2017 0.0  0.0 - 0.1 10e3/uL Final  . NEUT% 02/01/2017 68.5  38.4 - 76.8 % Final  . LYMPH% 02/01/2017 20.1  14.0 - 49.7 % Final  . MONO% 02/01/2017 9.8  0.0 - 14.0 % Final  . EOS% 02/01/2017 1.3  0.0 - 7.0 % Final  . BASO% 02/01/2017 0.3  0.0 - 2.0 % Final    (this displays the last labs from the last 3 days)  No results found for: TOTALPROTELP, ALBUMINELP, A1GS, A2GS, BETS, BETA2SER, GAMS, MSPIKE, SPEI (this displays SPEP labs)  No results found for: KPAFRELGTCHN, LAMBDASER, KAPLAMBRATIO (kappa/lambda light chains)  No results found for: HGBA, HGBA2QUANT, HGBFQUANT, HGBSQUAN (Hemoglobinopathy evaluation)   No results found for: LDH  No results found for: IRON, TIBC, IRONPCTSAT (Iron and TIBC)  No results found for: FERRITIN  Urinalysis    Component Value Date/Time   COLORURINE YELLOW 05/29/2015 [place] 05/29/2015 1113   LABSPEC 1.023 05/29/2015 1113   PHURINE 5.5 05/29/2015 1113   GLUCOSEU NEGATIVE 05/29/2015 1113   GLUCOSEU NEGATIVE 11/05/2012 1559   HGBUR NEGATIVE 05/29/2015 1113   BILIRUBINUR NEGATIVE 05/29/2015 1113   BILIRUBINUR n 10/05/2012 1316   KETONESUR NEGATIVE 05/29/2015 1113   PROTEINUR  NEGATIVE 05/29/2015 1113   UROBILINOGEN 0.2 11/05/2012 1559   NITRITE NEGATIVE 05/29/2015 1113   LEUKOCYTESUR NEGATIVE 05/29/2015 1113     STUDIES: Mr Breast Bilateral W Wo Contrast  Result Date: 02/07/2017 CLINICAL DATA:  Recent diagnosis of left breast cancer November 2018. On ultrasound-guided core biopsy, invasive mammary carcinoma was identified in the 11 o'clock and 1 o'clock locations of the left breast. Biopsy of enlarged left axillary lymph node demonstrated metastatic mammary carcinoma. Biopsy of a mass in the upper-outer quadrant of the right breast demonstrated a complex sclerosing lesion and usual duct hyperplasia. LABS:  None applicable EXAM: BILATERAL BREAST MRI WITH AND WITHOUT CONTRAST TECHNIQUE: Multiplanar, multisequence MR images of both breasts were obtained prior to and following the intravenous administration of 20 ml of MultiHance. THREE-DIMENSIONAL MR IMAGE RENDERING ON INDEPENDENT WORKSTATION: Three-dimensional MR images were rendered by post-processing of the original MR data on an independent workstation. The three-dimensional MR images were interpreted, and findings are reported in the following complete MRI report for this study. Three dimensional images were evaluated  at the independent DynaCad workstation COMPARISON:  Prior studies from [facility] 01/26/2017 and earlier FINDINGS: Breast composition: a. Almost entirely fat. Background parenchymal enhancement: Minimal Right breast: Within the upper-outer quadrant of the right breast, there is tissue marker artifact an enhancing biopsy tract. Surrounding the biopsy marker clip, there is a small amount of enhancement, measuring 1.0 x 1.1 cm and demonstrating medium wash-in and persistent type kinetics. This location represents the biopsy complex sclerosing lesion. Left breast: There is a large area of non mass enhancement in the upper inner and upper outer quadrant of the left breast, extending to involve the left  nipple. Area demonstrates rapid wash- in and washout type enhancement kinetics and measures 7.1 x 8.8 x 4.6 cm. Biopsy clips are identified in the upper central aspect of the breast and the upper inner quadrant, within the area of enhancement following recent core biopsies. Lymph nodes: Enlarged lymph nodes are identified in the lower left axilla. There are 3 morphologically abnormal lymph nodes in the left level 1 station. One of these contains a tissue marker clip from recent biopsy Right axilla is unremarkable. No enlarged lymph nodes are identified in the internal mammary regions region. Ancillary findings:  None. IMPRESSION: 1. Large area of non mass enhancement in the left breast, consistent with known malignancy. Enhancement measures at least 8.8 cm and involves the left nipple. 2. Enlarged left axillary lymph nodes, consistent with known metastatic disease. 3. 1.5 non mass enhancement associated tissue marker clip artifact in the upper-outer quadrant of the right breast associated with known complex sclerosing lesion. RECOMMENDATION: Treatment plan. BI-RADS CATEGORY  6: Known biopsy-proven malignancy. Electronically Signed   By: Nolon Nations M.D.   On: 02/07/2017 14:29    ELIGIBLE FOR AVAILABLE RESEARCH PROTOCOL: No  ASSESSMENT: 66 y.o. [place] woman status post bilateral biopsies 01/26/2017, showing  (1) in the right breast, a complex sclerosing lesion  (2) in the left breast, a cT2 pN1 invasive ductal carcinoma (with some lobular features but E-cadherin positive), grade 2, estrogen and progesterone receptor positive, HER-2 not amplified, with an MIB-1 of 15-20%  (a) breast MRI 02/04/2017 suggests a T3 N1 tumor  (3) to start neoadjuvant cyclophosphamide/Taxotere 03/01/2017, to be repeated every 21 days x4  (4) definitive surgery to follow  (5) adjuvant radiation to follow surgery  ( 6) to start antiestrogens at the completion of local treatment  PLAN: I spent just short of an  hour today with Zendayah going over her safety we reviewed the breast MRI and because there is a bit more disease there than we anticipated she understands that even with neoadjuvant chemotherapy it may or may not be possible to save the breast.  Breast conserving surgery if possible is still the goal.  She understands that neoadjuvant chemotherapy and estrogen receptor positive cases is not as effective as an triple negative cases.  This is even more true in her case since this tumor has some lobular features.  We very rarely have complete responses and in fact if we have a 25-40% shrinkage of the tumor I will be very satisfied.  We discussed her chemotherapy options.  We had considered AC/T but given that this tumor is not high-grade and is not fast growing, I think 4 cycles of cyclophosphamide and docetaxel is adequate especially as she is concerned about the possibility of heart damage from doxorubicin.  She does understand the rare possibility of her hair not coming back with docetaxel.  The patient's sister had suggested the  patient moved to [place] to get her treatments there.  The problem is she would have to establish herself with a completely new team, would have to close her home here, and ensured it would be difficult inconvenient and protracted.  It seems to be much easier for her sister to come here a few days every 3 weeks around the time of Aldean's treatment and that will be the plan.  We discussed insurance issues since Briellah is changing to Medicare at the beginning of the year just as her treatment starts, and we also discussed hair loss issues and I wrote her a prescription for cranial prostheses.  Finally we reviewed issues regarding her immune system and how to avoid getting an infection while she is getting her chemotherapy.  The plan then will be to start her chemotherapy 03/01/2016.  We will asked Dr. Marlou Starks if possible to place a port before that date.  She will see [place] again on  02/24/2017 to discuss her supportive therapy.  I have encouraged her to include her sister even if by long distance telephone in that visit.  Ideally the sister would come to town January 3 and stay about 5 days.  After that first cycle of all 3 additional cycles will be very similar and they can decide whether the sister needs to come at all.  Kristyl Athens, Virgie Dad, MD  02/08/17 3:29 PM Medical Oncology and Hematology [facility] [address] [place], [place] [address] Tel. [phone]    Fax. [phone]  This document serves as a record of services personally performed by Chauncey Cruel, MD. It was created on his behalf by Margit Banda, a trained medical scribe. The creation of this record is based on the scribe's personal observations and the provider's statements to them.   I have reviewed the above documentation for accuracy and completeness, and I agree with the above.

## 2017-02-08 NOTE — Progress Notes (Signed)
START ON PATHWAY REGIMEN - Breast     A cycle is every 21 days:     Docetaxel      Cyclophosphamide   **Always confirm dose/schedule in your pharmacy ordering system**    Patient Characteristics: Preoperative or Nonsurgical Candidate (Clinical Staging), Neoadjuvant Therapy followed by Surgery, Invasive Disease, Chemotherapy, HER2 Negative/Unknown/Equivocal, ER Positive Therapeutic Status: Preoperative or Nonsurgical Candidate (Clinical Staging) AJCC M Category: cM0 AJCC Grade: G2 Breast Surgical Plan: Neoadjuvant Therapy followed by Surgery ER Status: Positive (+) AJCC 8 Stage Grouping: IIA HER2 Status: Negative (-) AJCC T Category: cT3 AJCC N Category: cN1 PR Status: Positive (+) Intent of Therapy: Curative Intent, Discussed with Patient

## 2017-02-10 ENCOUNTER — Encounter: Payer: Self-pay | Admitting: General Practice

## 2017-02-10 ENCOUNTER — Ambulatory Visit: Payer: BC Managed Care – PPO | Admitting: Family Medicine

## 2017-02-10 ENCOUNTER — Encounter: Payer: Self-pay | Admitting: Family Medicine

## 2017-02-10 VITALS — BP 130/80 | HR 80 | Temp 98.0°F | Ht 69.0 in | Wt 229.8 lb

## 2017-02-10 DIAGNOSIS — C50919 Malignant neoplasm of unspecified site of unspecified female breast: Secondary | ICD-10-CM

## 2017-02-10 DIAGNOSIS — Z6833 Body mass index (BMI) 33.0-33.9, adult: Secondary | ICD-10-CM | POA: Diagnosis not present

## 2017-02-10 DIAGNOSIS — R300 Dysuria: Secondary | ICD-10-CM | POA: Diagnosis not present

## 2017-02-10 LAB — POCT URINALYSIS DIPSTICK
Bilirubin, UA: NEGATIVE
Glucose, UA: NEGATIVE
Ketones, UA: NEGATIVE
LEUKOCYTES UA: NEGATIVE
NITRITE UA: NEGATIVE
PH UA: 5 (ref 5.0–8.0)
PROTEIN UA: NEGATIVE
UROBILINOGEN UA: 0.2 U/dL

## 2017-02-10 NOTE — Progress Notes (Signed)
[place] Psychosocial Distress Screening Spiritual Care  LVM for [facility] following Breast Multidisciplinary Clinic to introduce [place] team/resources, review distress screen per protocol, and asses for distress and other psychosocial needs. .  The patient scored a 10 on the Psychosocial Distress Thermometer which indicates severe distress.  ONCBCN DISTRESS SCREENING 02/10/2017  Screening Type Initial Screening  Distress experienced in past week (1-10) 10  Practical problem type Housing;Insurance  Emotional problem type Nervousness/Anxiety;Adjusting to illness;Isolation/feeling alone  Spiritual/Religous concerns type Facing my mortality  Information Concerns Type Lack of info about diagnosis;Lack of info about treatment;Lack of info about complementary therapy choices;Lack of info about maintaining fitness  Physical Problem type Pain;Constipation/diarrhea  Referral to support programs Yes    Follow up needed: Yes.  LVM encouraging pt to return call; plan to f/u by phone next week if I don't hear back.   [place], [place], [facility] Pager [phone] Voicemail [phone]

## 2017-02-10 NOTE — Progress Notes (Signed)
[place] Psychosocial Distress Screening Clinical Social Work  Clinical Social Work was referred by distress screening protocol.  The patient scored a 10 on the Psychosocial Distress Thermometer which indicates severe distress. Clinical Social Worker Edwyna Shell to assess for distress and other psychosocial needs.   ONCBCN DISTRESS SCREENING 02/10/2017  Screening Type Initial Screening  Distress experienced in past week (1-10) 10  Practical problem type Housing;Insurance  Emotional problem type Nervousness/Anxiety;Adjusting to illness;Isolation/feeling alone  Spiritual/Religous concerns type Facing my mortality  Information Concerns Type Lack of info about diagnosis;Lack of info about treatment;Lack of info about complementary therapy choices;Lack of info about maintaining fitness  Physical Problem type Pain;Constipation/diarrhea  Referral to support programs Yes    Clinical Social Worker follow up needed: No.   "Its an avalanche - it came just as I had given my employer a letter stating I was going to retire."  "Overwhelming because I had officially given notice to retire and then all this happened."  Patient very appreciative of multidisciplinary clinic and treatment team, feels well cared for and supported.  Has worked out Research officer, trade union given.  Has worked out plan w her employer and will retire as planned.  CSW reviewed options for increasing coping skills and stress reduction through participation in support groups, workshops, fitness activities, art and similar.  Patient appreciates care provided thus far at [facility] and states she will contact CSW and [place] as needed going forward.  Information and resource packet mailed at patient request.  Edwyna Shell, LCSW Clinical Social Worker Phone:  [phone]   If yes, follow up plan:

## 2017-02-11 LAB — URINALYSIS, MICROSCOPIC ONLY
Bacteria, UA: NONE SEEN /HPF
Hyaline Cast: NONE SEEN /LPF
SQUAMOUS EPITHELIAL / LPF: NONE SEEN /HPF (ref <=5)

## 2017-02-11 LAB — URINE CULTURE
MICRO NUMBER:: 81408416
RESULT: NO GROWTH
SPECIMEN QUALITY:: ADEQUATE

## 2017-02-11 NOTE — Progress Notes (Signed)
HPI:  Brooke [name] is a pleasant 66 yo with a PMH obesity, malanoma (sees dermatology), unfortunately recent dx breast ca here for follow up. She is plugged in with a team of specialist for her recently diagnosed breast ca with metastasis to lymph nodes per pt report and MRI report. she wants my thoughts on [facility] vs tertiary care centers in the area. She seems very pleased with her team and very knowledgeable about her treatment plan - starting chemo in January, then surgery and radiation per her report. Emotional seems to be coping well. Was told needs to remain active throughout tx. She also is proud of continued improved dietary changes. Has many questions about a healthy diet. She also is afraid  of how antiestrogen therapy will impact her appearance. New concern of mild dysuria the last few days. No fever, malaise, hematuria, abd or pelvic pain, nausea or vomiting. Past due for colon ca screening per epic.  ROS: See pertinent positives and negatives per HPI.  Past Medical History:  Diagnosis Date  . Arthritis   . Bilateral dry eyes   . Cancer ([place])   . Family history of breast cancer 02/02/2017  . Family history of colon cancer 02/02/2017  . Family history of prostate cancer in father 02/02/2017  . Fibroid   . Hyperlipidemia   . Kidney stones   . Melanoma of upper arm ([place]) 2009   RIGHT ARM   . Osteopenia 05/2015   T score -1.3 FRAX 6%/0.5% stable from prior DEXA  . UTI (urinary tract infection)   . Vitamin D deficiency 07/2009   LOW VITAMIN D 29  . Vitamin deficiency 07/2008   VITAMIN D LOW 23    Past Surgical History:  Procedure Laterality Date  . ABDOMINAL SURGERY  1975   ovairan cyst   . CHOLECYSTECTOMY    . DERMOID CYST  EXCISION    . DILATION AND CURETTAGE OF UTERUS  1998  . GALLBLADDER SURGERY    . HYSTEROSCOPY    . SKIN BIOPSY    . SKIN CANCER EXCISION  2010,2011   2010-RIGHT ARM, 2011-LEFT LOWER LEG.-DR Rolm Bookbinder DERM. DR. Sarajane Jews IS HER DERM  SURGEON.    Family History  Problem Relation Age of Onset  . Diabetes Father   . Hypertension Father   . Heart disease Father   . Prostate cancer Father 67  . Breast cancer Sister 50       again at age 4, GT results unk  . Skin cancer Sister   . Colon cancer Paternal Grandfather 1  . Hypertension Mother   . Heart failure Mother     Social History   Socioeconomic History  . Marital status: Single    Spouse name: None  . Number of children: None  . Years of education: None  . Highest education level: None  Social Needs  . Financial resource strain: None  . Food insecurity - worry: None  . Food insecurity - inability: None  . Transportation needs - medical: None  . Transportation needs - non-medical: None  Occupational History  . None  Tobacco Use  . Smoking status: Never Smoker  . Smokeless tobacco: Never Used  Substance and Sexual Activity  . Alcohol use: No    Frequency: Never    Comment: rare  . Drug use: No  . Sexual activity: No    Birth control/protection: Post-menopausal  Other Topics Concern  . None  Social History Narrative  . None  Current Outpatient Medications:  .  alclomethasone (ACLOVATE) 0.05 % cream, APPLY TO ONLY THE AFFECTED AREA ON THE SKIN PRN, Disp: , Rfl: 0 .  B Complex Vitamins (VITAMIN-B COMPLEX PO), Take by mouth daily., Disp: , Rfl:  .  Cholecalciferol (VITAMIN D3) LIQD, by Does not apply route daily., Disp: , Rfl:  .  dexamethasone (DECADRON) 4 MG tablet, Take 2 tablets (8 mg total) by mouth 2 (two) times daily. Start the day before Taxotere. Then again the day after chemo for 3 days., Disp: 30 tablet, Rfl: 1 .  lidocaine-prilocaine (EMLA) cream, Apply to affected area once, Disp: 30 g, Rfl: 3 .  LORazepam (ATIVAN) 0.5 MG tablet, Take 1 tablet (0.5 mg total) by mouth at bedtime as needed (Nausea or vomiting)., Disp: 30 tablet, Rfl: 0 .  prochlorperazine (COMPAZINE) 10 MG tablet, Take 1 tablet (10 mg total) by mouth every 6 (six)  hours as needed (Nausea or vomiting)., Disp: 30 tablet, Rfl: 1 .  vitamin C (ASCORBIC ACID) 500 MG tablet, Take 500 mg by mouth daily., Disp: , Rfl:  .  VITAMIN E PO, Take by mouth daily., Disp: , Rfl:  .  XIIDRA 5 % SOLN, Place 1 drop into both eyes daily. , Disp: , Rfl: 1  EXAM:  Vitals:   02/10/17 1615  BP: 130/80  Pulse: 80  Temp: 98 F (36.7 C)    Body mass index is 33.94 kg/m.  GENERAL: vitals reviewed and listed above, alert, oriented, appears well hydrated and in no acute distress  HEENT: atraumatic, conjunttiva clear, no obvious abnormalities on inspection of external nose and ears  NECK: no obvious masses on inspection  LUNGS: clear to auscultation bilaterally, no wheezes, rales or rhonchi, good air movement  CV: HRRR, no peripheral edema  ABD: soft, nttp, no cva TTP  MS: moves all extremities without noticeable abnormality  PSYCH: pleasant and cooperative, no obvious depression or anxiety  ASSESSMENT AND PLAN:  Discussed the following assessment and plan:  Dysuria - Plan: POC Urinalysis Dipstick, Culture, Urine, Urinalysis, microscopic only, CANCELED: Urine Microscopic Only -udip with tr bld - very common finding on our udips - will get micro and culture to further evaluate  Metastatic breast cancer ([place]) -listened and supported on recent whirlwind of a journey through diagnosis and planning of treatment -managed by team at cancer center -seems pleased with her team and I let her know that from my experience recently with other patients, the care at the breast center seems to be top notch  BMI 33.0-33.9,adult -she continues to make great strides - she had a very poor diet and has been working hard to make changes - encouraged and supported -discussed a lot of different ways to make healthy choices at home and out that are quick, convenient and afforble -encouraged a nutrient rich, unprocessed, low sugar, Mediterranean style whole foods based diet and  regular gentle activity  HM: she is past due for colonoscopy per EPIC. She sees her gynecologist for her physicals and this was reviewed with her last year and she agreed to schedule. Will have my assistant see if she ended up doing this at outside facility or if is still due to notify her, thought she likely will need to get through more pressing concern of treatment for the breast ca and pursue later next year. Could do cologuard prior to treatment if she wishes.  -Patient advised to return or notify a doctor immediately if symptoms worsen or persist or new concerns arise.  There are no Patient Instructions on file for this visit.  Colin Benton R., DO

## 2017-02-13 ENCOUNTER — Ambulatory Visit: Payer: BC Managed Care – PPO | Admitting: Family Medicine

## 2017-02-13 ENCOUNTER — Encounter: Payer: Self-pay | Admitting: *Deleted

## 2017-02-13 ENCOUNTER — Telehealth: Payer: Self-pay | Admitting: Adult Health

## 2017-02-13 ENCOUNTER — Encounter: Payer: Self-pay | Admitting: General Practice

## 2017-02-13 NOTE — Telephone Encounter (Signed)
R/s appt per 12/14 sch message - left message with new appt date and time - patient also seeing patient [place] 12/28 to pick up new schedule

## 2017-02-13 NOTE — Progress Notes (Signed)
[facility] Spiritual Care Note  Left f/u VM, encouraging pt to return call.   [place], [place], [facility] Pager [phone] Voicemail [phone]

## 2017-02-14 ENCOUNTER — Telehealth: Payer: Self-pay | Admitting: *Deleted

## 2017-02-14 NOTE — Telephone Encounter (Signed)
I spoke with the pt and she stated her insurance would not cover Cologuard before and she will call back for a referral if needed next year or once she has recovered from breast cancer.  Message sent to Dr Maudie Mercury.

## 2017-02-14 NOTE — Telephone Encounter (Signed)
-----   Message from [name], DO sent at 02/11/2017  6:45 PM EST ----- Can you see if she had colonosocpy at [facility]? Or if still due? If not up to date and no hx polyps or abnormal colonoscopy she could check to see if cologuard ok with her insurance and do now or could do after treatments later next year.

## 2017-02-16 ENCOUNTER — Telehealth: Payer: Self-pay | Admitting: Adult Health

## 2017-02-16 NOTE — Telephone Encounter (Signed)
Left message for patient regarding upcoming January appointments.  °

## 2017-02-24 ENCOUNTER — Ambulatory Visit (HOSPITAL_BASED_OUTPATIENT_CLINIC_OR_DEPARTMENT_OTHER): Payer: BC Managed Care – PPO | Admitting: Adult Health

## 2017-02-24 ENCOUNTER — Encounter (HOSPITAL_COMMUNITY): Payer: Self-pay | Admitting: *Deleted

## 2017-02-24 ENCOUNTER — Telehealth: Payer: Self-pay | Admitting: Adult Health

## 2017-02-24 ENCOUNTER — Encounter: Payer: Self-pay | Admitting: Adult Health

## 2017-02-24 ENCOUNTER — Encounter: Payer: Self-pay | Admitting: *Deleted

## 2017-02-24 ENCOUNTER — Other Ambulatory Visit: Payer: Self-pay

## 2017-02-24 VITALS — BP 125/77 | HR 78 | Temp 97.5°F | Resp 17 | Ht 69.0 in | Wt 224.3 lb

## 2017-02-24 DIAGNOSIS — C50812 Malignant neoplasm of overlapping sites of left female breast: Secondary | ICD-10-CM

## 2017-02-24 DIAGNOSIS — Z170 Estrogen receptor positive status [ER+]: Secondary | ICD-10-CM

## 2017-02-24 NOTE — Progress Notes (Signed)
Pt denies SOB, chest pain, and being under the care of a cardiologist. Pt denies having a stress test, echo and cardiac cath. Pt denies having an EKG and chest x ray within the last year. Pt denies recent labs. Pt made aware to stop taking  Aspirin, vitamins, fish oil and herbal medications. Do not take any NSAIDs ie: Ibuprofen, Advil, Naproxen (Aleve), Motrin, BC and Goody Powder or any medication containing Aspirin. Pt verbalized understanding of all pre-op instructions.

## 2017-02-24 NOTE — Progress Notes (Signed)
[place]  [phone] [phone]     ID: ALTAGRACIA RONE DOB: May 17, 1950  MR#: [id number]  FFM#:[id number]  Patient Care Team: [name], DO as PCP - General (Family Medicine) Phineas Real, Belinda Block, MD (Obstetrics and Gynecology) Rolm Bookbinder, MD as Attending Physician (Dermatology) Magrinat, Virgie Dad, MD as Consulting Physician (Oncology) Kyung Rudd, MD as Consulting Physician (Radiation Oncology) Jovita Kussmaul, MD as Consulting Physician (General Surgery) OTHER MD:  CHIEF COMPLAINT: Estrogen receptor positive breast cancer  CURRENT TREATMENT: Neoadjuvant chemotherapy   HISTORY OF CURRENT ILLNESS: From the original intake note:  Danni herself palpated a change in her left breast and brought this to medical attention.  On 01/18/2017 she underwent bilateral diagnostic mammography with tomography and bilateral breast ultrasonography at [facility]  The breast density was category B.  At the most recent exam was from December 2016.  In the right breast upper outer quadrant there was a 1 cm irregular mass associated with punctate calcification.  By ultrasound this was not well-defined, and it was biopsied on 01/26/2017 with tomographic guidance this showed a complex sclerosing lesion with the usual ductal hyperplasia.  The right axilla was sonographically  On the left mammography showed a 4 cm dense mass with indistinct margins in the upper inner quadrant.  Ultrasound defined a 3 cm irregular mass in the upper inner quadrant of the left breast which was palpable.  There was an abnormal lymph node in the left axilla.  Biopsy of the left mass and abnormal lymph node ([id number], on 01/26/2017) showed both to be involved by invasive ductal carcinoma, with some lobular features but E-cadherin positive, estrogen and progesterone receptors both 100% positive, with strong staining intensity, with MIB-1 between 15-20%.  There was no HER-2 amplification, the  signals ratio being 1.31/1.58 and the number per cell 2.30/2.05  The patient's subsequent history is as detailed below.  INTERVAL HISTORY: Alondra returns today for discussion and clarification of her upcoming treatment.  She is due to begin treatment with Docetaxel/Cyclosphosphamide given on day 1 of a 21 day cycle x 4 neoadjuvantly.  She is slightly anxious about chemotherapy, particularly due to the fact that she doesn't have anyone who lives nearby.    REVIEW OF SYSTEMS: Astin is doing well today.  She will undergo port placement on 03/02/2017.  She is unsure about whether or not she will have a ride to her port placement.  She denies any other issues and a detailed ROS is otherwise non contributory.    PAST MEDICAL HISTORY: Past Medical History:  Diagnosis Date  . Anemia   . Arthritis   . Bilateral dry eyes   . Cancer ([place])   . Family history of breast cancer 02/02/2017  . Family history of colon cancer 02/02/2017  . Family history of prostate cancer in father 02/02/2017  . Fibroid   . History of kidney stones   . Hyperlipidemia   . Kidney stones   . Melanoma of upper arm ([place]) 2009   RIGHT ARM ; left breast cancer, squamous cell carcinoma of the skin  . Obesity (BMI 30.0-34.9)   . Osteopenia 05/2015   T score -1.3 FRAX 6%/0.5% stable from prior DEXA  . PONV (postoperative nausea and vomiting)   . UTI (urinary tract infection)   . Varicose veins of both lower extremities   . Vitamin D deficiency 07/2009   LOW VITAMIN D 29  . Vitamin deficiency 07/2008   VITAMIN D LOW 23  PAST SURGICAL HISTORY: Past Surgical History:  Procedure Laterality Date  . ABDOMINAL SURGERY  1975   ovairan cyst   . APPENDECTOMY    . CHOLECYSTECTOMY    . DERMOID CYST  EXCISION    . DILATION AND CURETTAGE OF UTERUS  1998  . GALLBLADDER SURGERY    . HYSTEROSCOPY    . SKIN BIOPSY    . SKIN CANCER EXCISION  2010,2011   2010-RIGHT ARM, 2011-LEFT LOWER LEG.-DR Rolm Bookbinder DERM. DR. Sarajane Jews IS HER DERM  SURGEON.    FAMILY HISTORY Family History  Problem Relation Age of Onset  . Diabetes Father   . Hypertension Father   . Heart disease Father   . Prostate cancer Father 24  . Breast cancer Sister 38       again at age 66, GT results unk  . Skin cancer Sister   . Colon cancer Paternal Grandfather 41  . Hypertension Mother   . Heart failure Mother   The patient's father was diagnosed with prostate cancer in his 57s and died at age 68 from complications of that disease.  The patient's mother died at age 71 with congestive heart failure.  The patient had no brothers, 3 sisters.  One sister had breast cancer at age 28 and again at age 43.  She has been genetically tested but surprisingly the sister does not know her genetics testing results.  In addition a paternal grandfather had colon cancer in his 57s.  There is no history of ovarian cancer in the family to the patient's knowledge.  GYNECOLOGIC HISTORY:  Patient's last menstrual period was 03/18/1997. Menarche age 50. The patient is GX P0.  She stopped having periods in 1999 and took hormone replacement approximately 5 years.  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 She is single and lives alone with no pets.   ADVANCED DIRECTIVES:    HEALTH MAINTENANCE: Social History   Tobacco Use  . Smoking status: Never Smoker  . Smokeless tobacco: Never Used  Substance Use Topics  . Alcohol use: No    Frequency: Never    Comment: rare  . Drug use: No     Colonoscopy: Never  PAP:  Bone density: 2017/osteopenia   Allergies  Allergen Reactions  . Benzalkonium Chloride Other (See Comments)    Redness  . Latex Rash  . Neosporin [Neomycin-Bacitracin Zn-Polymyx] Swelling, Rash and Other (See Comments)    Blisters    Current Outpatient Medications  Medication Sig Dispense Refill  . acetaminophen (TYLENOL) 500 MG  tablet Take 500 mg by mouth every 6 (six) hours as needed for moderate pain or headache.    . alclomethasone (ACLOVATE) 0.05 % cream Apply to affected area once daily as needed for dermatitis  0  . dexamethasone (DECADRON) 4 MG tablet Take 2 tablets (8 mg total) by mouth 2 (two) times daily. Start the day before Taxotere. Then again the day after chemo for 3 days. 30 tablet 1  . Hyprom-Naphaz-Polysorb-Zn Sulf (CLEAR EYES COMPLETE OP) Place 1-2 drops into both eyes daily as needed (for dry eyes).    Marland Kitchen ibuprofen (ADVIL,MOTRIN) 200 MG tablet Take 200 mg by mouth every 6 (six) hours as needed for headache or moderate pain.    Marland Kitchen lidocaine-prilocaine (EMLA) cream Apply to affected area once 30 g 3  . LORazepam (ATIVAN) 0.5 MG tablet Take 1 tablet (0.5 mg total) by mouth at bedtime  as needed (Nausea or vomiting). 30 tablet 0  . prochlorperazine (COMPAZINE) 10 MG tablet Take 1 tablet (10 mg total) by mouth every 6 (six) hours as needed (Nausea or vomiting). 30 tablet 1  . XIIDRA 5 % SOLN Place 1 drop into both eyes daily.   1   No current facility-administered medications for this visit.     OBJECTIVE:   Vitals:   02/24/17 1309  BP: 125/77  Pulse: 78  Resp: 17  Temp: (!) 97.5 F (36.4 C)  SpO2: 98%     Body mass index is 33.12 kg/m.   Wt Readings from Last 3 Encounters:  02/24/17 224 lb 4.8 oz (101.7 kg)  02/10/17 229 lb 12.8 oz (104.2 kg)  02/08/17 230 lb 6.4 oz (104.5 kg)      ECOG FS:0 - Asymptomatic GENERAL: Patient is a well appearing female in no acute distress HEENT:  Sclerae anicteric.  Oropharynx clear and moist. No ulcerations or evidence of oropharyngeal candidiasis. Neck is supple.  NODES:  No cervical, supraclavicular, or axillary lymphadenopathy palpated.  BREAST EXAM:  Deferred. LUNGS:  Clear to auscultation bilaterally.  No wheezes or rhonchi. HEART:  Regular rate and rhythm. No murmur appreciated. ABDOMEN:  Soft, nontender.  Positive, normoactive bowel sounds. No  organomegaly palpated. MSK:  No focal spinal tenderness to palpation. Full range of motion bilaterally in the upper extremities. EXTREMITIES:  No peripheral edema.   SKIN:  Clear with no obvious rashes or skin changes. No nail dyscrasia. NEURO:  Nonfocal. Well oriented.  Appropriate affect.    LAB RESULTS:  CMP     Component Value Date/Time   NA 138 02/01/2017 1212   K 3.9 02/01/2017 1212   CL 101 12/16/2016 1046   CO2 23 02/01/2017 1212   GLUCOSE 88 02/01/2017 1212   BUN 9.2 02/01/2017 1212   CREATININE 0.8 02/01/2017 1212   CALCIUM 9.7 02/01/2017 1212   PROT 7.3 02/01/2017 1212   ALBUMIN 4.2 02/01/2017 1212   AST 19 02/01/2017 1212   ALT 15 02/01/2017 1212   ALKPHOS 76 02/01/2017 1212   BILITOT 0.85 02/01/2017 1212    No results found for: TOTALPROTELP, ALBUMINELP, A1GS, A2GS, BETS, BETA2SER, GAMS, MSPIKE, SPEI  No results found for: Nils Pyle, [facility]  Lab Results  Component Value Date   WBC 7.1 02/01/2017   NEUTROABS 4.9 02/01/2017   HGB 13.7 02/01/2017   HCT 41.5 02/01/2017   MCV 88.9 02/01/2017   PLT 302 02/01/2017      Chemistry      Component Value Date/Time   NA 138 02/01/2017 1212   K 3.9 02/01/2017 1212   CL 101 12/16/2016 1046   CO2 23 02/01/2017 1212   BUN 9.2 02/01/2017 1212   CREATININE 0.8 02/01/2017 1212      Component Value Date/Time   CALCIUM 9.7 02/01/2017 1212   ALKPHOS 76 02/01/2017 1212   AST 19 02/01/2017 1212   ALT 15 02/01/2017 1212   BILITOT 0.85 02/01/2017 1212       No results found for: LABCA2  No components found for: EAVWUJ811  No results for input(s): INR in the last 168 hours.  No results found for: LABCA2  No results found for: BJY782  No results found for: NFA213  No results found for: YQM578  No results found for: CA2729  No components found for: HGQUANT  No results found for: CEA1 / No results found for: CEA1   No results found for: AFPTUMOR  No results found for:  [place]  No results found for: PSA1  No visits with results within 3 Day(s) from this visit.  Latest known visit with results is:  Office Visit on 02/10/2017  Component Date Value Ref Range Status  . Color, UA 02/10/2017 yellow   Final  . Clarity, UA 02/10/2017 clear   Final  . Glucose, UA 02/10/2017 negative   Final  . Bilirubin, UA 02/10/2017 negative   Final  . Ketones, UA 02/10/2017 negative   Final  . Spec Grav, UA 02/10/2017 >=1.030* 1.010 - 1.025 Final  . Blood, UA 02/10/2017 trace   Final  . pH, UA 02/10/2017 5.0  5.0 - 8.0 Final  . Protein, UA 02/10/2017 negative   Final  . Urobilinogen, UA 02/10/2017 0.2  0.2 or 1.0 E.U./dL Final  . Nitrite, UA 02/10/2017 negative   Final  . Leukocytes, UA 02/10/2017 Negative  Negative Final  . MICRO NUMBER: 02/10/2017 [id number]   Final  . SPECIMEN QUALITY: 02/10/2017 ADEQUATE   Final  . Sample Source 02/10/2017 URINE   Final  . STATUS: 02/10/2017 FINAL   Final  . Result: 02/10/2017 No Growth   Final  . WBC, UA 02/10/2017 0-5  0 - 5 /HPF Final  . RBC / HPF 02/10/2017 0-2  0 - 2 /HPF Final  . Squamous Epithelial / LPF 02/10/2017 NONE SEEN  < OR = 5 /HPF Final  . Bacteria, UA 02/10/2017 NONE SEEN  NONE SEEN /HPF Final  . Calcium Oxalate Crystal 02/10/2017 MODERATE* NONE OR FE /HPF Final  . Hyaline Cast 02/10/2017 NONE SEEN  NONE SEEN /LPF Final    (this displays the last labs from the last 3 days)  No results found for: TOTALPROTELP, ALBUMINELP, A1GS, A2GS, BETS, BETA2SER, GAMS, MSPIKE, SPEI (this displays SPEP labs)  No results found for: KPAFRELGTCHN, LAMBDASER, KAPLAMBRATIO (kappa/lambda light chains)  No results found for: HGBA, HGBA2QUANT, HGBFQUANT, HGBSQUAN (Hemoglobinopathy evaluation)   No results found for: LDH  No results found for: IRON, TIBC, IRONPCTSAT (Iron and TIBC)  No results found for: FERRITIN  Urinalysis    Component Value Date/Time   COLORURINE YELLOW 05/29/2015 [place] 05/29/2015  1113   LABSPEC 1.023 05/29/2015 1113   PHURINE 5.5 05/29/2015 1113   GLUCOSEU NEGATIVE 05/29/2015 1113   GLUCOSEU NEGATIVE 11/05/2012 1559   HGBUR NEGATIVE 05/29/2015 1113   BILIRUBINUR negative 02/10/2017 1622   KETONESUR NEGATIVE 05/29/2015 1113   PROTEINUR negative 02/10/2017 1622   PROTEINUR NEGATIVE 05/29/2015 1113   UROBILINOGEN 0.2 02/10/2017 1622   UROBILINOGEN 0.2 11/05/2012 1559   NITRITE negative 02/10/2017 1622   NITRITE NEGATIVE 05/29/2015 1113   LEUKOCYTESUR Negative 02/10/2017 1622     STUDIES: Mr Breast Bilateral W Wo Contrast  Result Date: 02/07/2017 CLINICAL DATA:  Recent diagnosis of left breast cancer November 2018. On ultrasound-guided core biopsy, invasive mammary carcinoma was identified in the 11 o'clock and 1 o'clock locations of the left breast. Biopsy of enlarged left axillary lymph node demonstrated metastatic mammary carcinoma. Biopsy of a mass in the upper-outer quadrant of the right breast demonstrated a complex sclerosing lesion and usual duct hyperplasia. LABS:  None applicable EXAM: BILATERAL BREAST MRI WITH AND WITHOUT CONTRAST TECHNIQUE: Multiplanar, multisequence MR images of both breasts were obtained prior to and following the intravenous administration of 20 ml of MultiHance. THREE-DIMENSIONAL MR IMAGE RENDERING ON INDEPENDENT WORKSTATION: Three-dimensional MR images were rendered by post-processing of the original MR data on an independent workstation. The three-dimensional MR images were interpreted, and findings are  reported in the following complete MRI report for this study. Three dimensional images were evaluated at the independent DynaCad workstation COMPARISON:  Prior studies from [facility] 01/26/2017 and earlier FINDINGS: Breast composition: a. Almost entirely fat. Background parenchymal enhancement: Minimal Right breast: Within the upper-outer quadrant of the right breast, there is tissue marker artifact an enhancing biopsy tract.  Surrounding the biopsy marker clip, there is a small amount of enhancement, measuring 1.0 x 1.1 cm and demonstrating medium wash-in and persistent type kinetics. This location represents the biopsy complex sclerosing lesion. Left breast: There is a large area of non mass enhancement in the upper inner and upper outer quadrant of the left breast, extending to involve the left nipple. Area demonstrates rapid wash- in and washout type enhancement kinetics and measures 7.1 x 8.8 x 4.6 cm. Biopsy clips are identified in the upper central aspect of the breast and the upper inner quadrant, within the area of enhancement following recent core biopsies. Lymph nodes: Enlarged lymph nodes are identified in the lower left axilla. There are 3 morphologically abnormal lymph nodes in the left level 1 station. One of these contains a tissue marker clip from recent biopsy Right axilla is unremarkable. No enlarged lymph nodes are identified in the internal mammary regions region. Ancillary findings:  None. IMPRESSION: 1. Large area of non mass enhancement in the left breast, consistent with known malignancy. Enhancement measures at least 8.8 cm and involves the left nipple. 2. Enlarged left axillary lymph nodes, consistent with known metastatic disease. 3. 1.5 non mass enhancement associated tissue marker clip artifact in the upper-outer quadrant of the right breast associated with known complex sclerosing lesion. RECOMMENDATION: Treatment plan. BI-RADS CATEGORY  6: Known biopsy-proven malignancy. Electronically Signed   By: Nolon Nations M.D.   On: 02/07/2017 14:29    ELIGIBLE FOR AVAILABLE RESEARCH PROTOCOL: No  ASSESSMENT: 66 y.o. [place] woman status post bilateral biopsies 01/26/2017, showing  (1) in the right breast, a complex sclerosing lesion  (2) in the left breast, a cT2 pN1 invasive ductal carcinoma (with some lobular features but E-cadherin positive), grade 2, estrogen and progesterone receptor positive,  HER-2 not amplified, with an MIB-1 of 15-20%  (a) breast MRI 02/04/2017 suggests a T3 N1 tumor  (3) to start neoadjuvant cyclophosphamide/Taxotere 03/01/2017, to be repeated every 21 days x4  (4) definitive surgery to follow  (5) adjuvant radiation to follow surgery  ( 6) to start antiestrogens at the completion of local treatment  PLAN:  I reviewed Jolie's chemotherapy with her in detail.  We spent quite a bit of time reviewing possible side effects, preparation of treatment, and resources.  She will not have her sister at her treatment this time.  She has friends helping her some, but seems concerned about their dependability.  I reviewed other options for transporation with the patient.  She and I reviewed her anti emetics and I sent those into her pharmacy today.  I gave her a map and reviewed it with her in detail.  She knows to call for any questions or concerns during her treatment, or the week following.    She will return on 1/4 for labs, f/u and chemotherapy.  We will see her one week after that on 03/09/2017.  A total of (50) minutes of face-to-face time was spent with this patient with greater than 50% of that time in counseling and care-coordination.   Wilber Bihari, NP  02/24/17 9:10 PM Medical Oncology and Hematology [facility]  [place],  [id number] Tel. [phone]    Fax. [phone]

## 2017-02-24 NOTE — Telephone Encounter (Signed)
Gave patient AVS and calendar of upcoming January 2019 appointments. °

## 2017-02-27 NOTE — Pre-Procedure Instructions (Signed)
Brooke [name]  02/27/2017      [id number], [place] [address] Phone: [phone] Fax: [phone]    Your procedure is scheduled on March 02, 2017.  Report to [facility] Admitting at 08:15 A.M.  Call this number if you have problems the morning of surgery:  [phone]   Remember:  Do not eat food or drink liquids after midnight.  **Please complete your PRE-SURGERY ENSURE that was given to before you leave your house the morning of surgery.  Please, if able, drink it in one setting. DO NOT SIP.**   Take these medicines the morning of surgery with A SIP OF WATER :  Tylenol if needed Eye drops if needed  7 days prior to surgery STOP taking any Aspirin(unless otherwise instructed by your surgeon), Aleve, Naproxen, Ibuprofen, Motrin, Advil, Goody's, BC's, all herbal medications, fish oil, and all vitamins.   Do not wear jewelry, make-up or nail polish.  Do not wear lotions, powders, or perfumes, or deoderant.  Do not shave 48 hours prior to surgery.    Do not bring valuables to the hospital.  [facility] is not responsible for any belongings or valuables.  Contacts, dentures or bridgework may not be worn into surgery.  Leave your suitcase in the car.  After surgery it may be brought to your room.  For patients admitted to the hospital, discharge time will be determined by your treatment team.  Patients discharged the day of surgery will not be allowed to drive home.   Special instructions:   [place]- Preparing For Surgery  Before surgery, you can play an important role. Because skin is not sterile, your skin needs to be as free of germs as possible. You can reduce the number of germs on your skin by washing with CHG (chlorahexidine gluconate) Soap before surgery.  CHG is an antiseptic cleaner which kills germs and bonds with the skin to continue killing germs even  after washing.  Please do not use if you have an allergy to CHG or antibacterial soaps. If your skin becomes reddened/irritated stop using the CHG.  Do not shave (including legs and underarms) for at least 48 hours prior to first CHG shower. It is OK to shave your face.  Please follow these instructions carefully.   1. Shower the NIGHT BEFORE SURGERY and the MORNING OF SURGERY with CHG.   2. If you chose to wash your hair, wash your hair first as usual with your normal shampoo.  3. After you shampoo, rinse your hair and body thoroughly to remove the shampoo.  4. Use CHG as you would any other liquid soap. You can apply CHG directly to the skin and wash gently with a scrungie or a clean washcloth.   5. Apply the CHG Soap to your body ONLY FROM THE NECK DOWN.  Do not use on open wounds or open sores. Avoid contact with your eyes, ears, mouth and genitals (private parts). Wash Face and genitals (private parts)  with your normal soap.  6. Wash thoroughly, paying special attention to the area where your surgery will be performed.  7. Thoroughly rinse your body with warm water from the neck down.  8. DO NOT shower/wash with your normal soap after using and rinsing off the CHG Soap.  9. Pat yourself dry with a CLEAN TOWEL.  10. Wear CLEAN PAJAMAS to bed the night before surgery, wear comfortable  clothes the morning of surgery  11. Place CLEAN SHEETS on your bed the night of your first shower and DO NOT SLEEP WITH PETS.    Day of Surgery: Do not apply any deodorants/lotions. Please wear clean clothes to the hospital/surgery center.      Please read over the following fact sheets that you were given. Coughing and Deep Breathing and Surgical Site Infection Prevention

## 2017-03-01 ENCOUNTER — Ambulatory Visit: Payer: No Typology Code available for payment source

## 2017-03-01 ENCOUNTER — Encounter (HOSPITAL_COMMUNITY)
Admission: RE | Admit: 2017-03-01 | Discharge: 2017-03-01 | Disposition: A | Discharge status: Home / Self Care | Payer: Medicare Other | Source: Ambulatory Visit | Attending: General Surgery | Admitting: General Surgery

## 2017-03-01 ENCOUNTER — Other Ambulatory Visit: Payer: No Typology Code available for payment source

## 2017-03-01 ENCOUNTER — Encounter (HOSPITAL_COMMUNITY): Payer: Self-pay

## 2017-03-01 DIAGNOSIS — Z86711 Personal history of pulmonary embolism: Secondary | ICD-10-CM | POA: Diagnosis not present

## 2017-03-01 DIAGNOSIS — Z79899 Other long term (current) drug therapy: Secondary | ICD-10-CM | POA: Diagnosis not present

## 2017-03-01 DIAGNOSIS — Z803 Family history of malignant neoplasm of breast: Secondary | ICD-10-CM | POA: Diagnosis not present

## 2017-03-01 DIAGNOSIS — E7800 Pure hypercholesterolemia, unspecified: Secondary | ICD-10-CM | POA: Diagnosis not present

## 2017-03-01 DIAGNOSIS — Z170 Estrogen receptor positive status [ER+]: Secondary | ICD-10-CM | POA: Diagnosis not present

## 2017-03-01 DIAGNOSIS — C50212 Malignant neoplasm of upper-inner quadrant of left female breast: Secondary | ICD-10-CM | POA: Diagnosis not present

## 2017-03-01 HISTORY — DX: Anxiety disorder, unspecified: F41.9

## 2017-03-01 LAB — CBC
HEMATOCRIT: 43 % (ref 36.0–46.0)
Hemoglobin: 14.2 g/dL (ref 12.0–15.0)
MCH: 29.3 pg (ref 26.0–34.0)
MCHC: 33 g/dL (ref 30.0–36.0)
MCV: 88.7 fL (ref 78.0–100.0)
PLATELETS: 304 10*3/uL (ref 150–400)
RBC: 4.85 MIL/uL (ref 3.87–5.11)
RDW: 13.1 % (ref 11.5–15.5)
WBC: 6.1 10*3/uL (ref 4.0–10.5)

## 2017-03-01 LAB — BASIC METABOLIC PANEL
ANION GAP: 8 (ref 5–15)
BUN: 8 mg/dL (ref 6–20)
CALCIUM: 9.6 mg/dL (ref 8.9–10.3)
CO2: 28 mmol/L (ref 22–32)
Chloride: 101 mmol/L (ref 101–111)
Creatinine, Ser: 0.79 mg/dL (ref 0.44–1.00)
GFR calc Af Amer: >60 mL/min (ref >60)
GLUCOSE: 100 mg/dL — AB (ref 65–99)
Potassium: 3.4 mmol/L — ABNORMAL LOW (ref 3.5–5.1)
Sodium: 137 mmol/L (ref 135–145)

## 2017-03-01 MED ORDER — CHLORHEXIDINE GLUCONATE CLOTH 2 % EX PADS: 6.0000 | MEDICATED_PAD | Freq: Once | CUTANEOUS | Status: DC

## 2017-03-02 ENCOUNTER — Other Ambulatory Visit: Payer: Self-pay | Admitting: Oncology

## 2017-03-02 ENCOUNTER — Ambulatory Visit (HOSPITAL_COMMUNITY): Payer: Medicare Other

## 2017-03-02 ENCOUNTER — Encounter (HOSPITAL_COMMUNITY): Payer: Self-pay | Admitting: Certified Registered Nurse Anesthetist

## 2017-03-02 ENCOUNTER — Ambulatory Visit (HOSPITAL_COMMUNITY): Payer: Medicare Other | Admitting: Emergency Medicine

## 2017-03-02 ENCOUNTER — Other Ambulatory Visit: Payer: Self-pay

## 2017-03-02 ENCOUNTER — Encounter (HOSPITAL_COMMUNITY)
Admission: RE | Disposition: A | Discharge status: Home / Self Care | Payer: Self-pay | Source: Ambulatory Visit | Attending: General Surgery

## 2017-03-02 ENCOUNTER — Ambulatory Visit (HOSPITAL_COMMUNITY)
Admission: RE | Admit: 2017-03-02 | Discharge: 2017-03-03 | Disposition: A | Discharge status: Home / Self Care | Payer: Medicare Other | Source: Ambulatory Visit | Attending: General Surgery | Admitting: General Surgery

## 2017-03-02 ENCOUNTER — Telehealth: Payer: Self-pay | Admitting: Oncology

## 2017-03-02 DIAGNOSIS — Z452 Encounter for adjustment and management of vascular access device: Secondary | ICD-10-CM | POA: Diagnosis not present

## 2017-03-02 DIAGNOSIS — Z803 Family history of malignant neoplasm of breast: Secondary | ICD-10-CM | POA: Insufficient documentation

## 2017-03-02 DIAGNOSIS — C50912 Malignant neoplasm of unspecified site of left female breast: Secondary | ICD-10-CM | POA: Diagnosis present

## 2017-03-02 DIAGNOSIS — E7800 Pure hypercholesterolemia, unspecified: Secondary | ICD-10-CM | POA: Insufficient documentation

## 2017-03-02 DIAGNOSIS — Z170 Estrogen receptor positive status [ER+]: Secondary | ICD-10-CM | POA: Insufficient documentation

## 2017-03-02 DIAGNOSIS — C50212 Malignant neoplasm of upper-inner quadrant of left female breast: Secondary | ICD-10-CM | POA: Insufficient documentation

## 2017-03-02 DIAGNOSIS — Z86711 Personal history of pulmonary embolism: Secondary | ICD-10-CM | POA: Insufficient documentation

## 2017-03-02 DIAGNOSIS — Z79899 Other long term (current) drug therapy: Secondary | ICD-10-CM | POA: Insufficient documentation

## 2017-03-02 DIAGNOSIS — Z4682 Encounter for fitting and adjustment of non-vascular catheter: Secondary | ICD-10-CM | POA: Diagnosis not present

## 2017-03-02 DIAGNOSIS — C50812 Malignant neoplasm of overlapping sites of left female breast: Secondary | ICD-10-CM | POA: Diagnosis not present

## 2017-03-02 DIAGNOSIS — E559 Vitamin D deficiency, unspecified: Secondary | ICD-10-CM | POA: Diagnosis not present

## 2017-03-02 DIAGNOSIS — D259 Leiomyoma of uterus, unspecified: Secondary | ICD-10-CM | POA: Diagnosis not present

## 2017-03-02 DIAGNOSIS — Z419 Encounter for procedure for purposes other than remedying health state, unspecified: Secondary | ICD-10-CM

## 2017-03-02 DIAGNOSIS — Z95828 Presence of other vascular implants and grafts: Secondary | ICD-10-CM

## 2017-03-02 HISTORY — DX: Personal history of urinary calculi: Z87.442

## 2017-03-02 HISTORY — DX: Obesity, unspecified: E66.9

## 2017-03-02 HISTORY — DX: Other specified postprocedural states: Z98.890

## 2017-03-02 HISTORY — DX: Obesity, class 1: E66.811

## 2017-03-02 HISTORY — DX: Other specified postprocedural states: R11.2

## 2017-03-02 HISTORY — DX: Anemia, unspecified: D64.9

## 2017-03-02 HISTORY — DX: Asymptomatic varicose veins of bilateral lower extremities: I83.93

## 2017-03-02 HISTORY — PX: PORTACATH PLACEMENT: SHX2246

## 2017-03-02 SURGERY — INSERTION, TUNNELED CENTRAL VENOUS DEVICE, WITH PORT
Anesthesia: General | Site: Chest

## 2017-03-02 MED ORDER — OXYCODONE HCL 5 MG/5ML PO SOLN: 5.0000 mg | Freq: Once | ORAL | Status: DC | PRN

## 2017-03-02 MED ORDER — HEPARIN SOD (PORK) LOCK FLUSH 100 UNIT/ML IV SOLN
INTRAVENOUS | Status: DC | PRN
  Administered 2017-03-02: 500 [IU]

## 2017-03-02 MED ORDER — LIDOCAINE 2% (20 MG/ML) 5 ML SYRINGE
INTRAMUSCULAR | Status: AC
  Filled 2017-03-02: qty 5

## 2017-03-02 MED ORDER — ONDANSETRON HCL 4 MG/2ML IJ SOLN: 4.0000 mg | Freq: Four times a day (QID) | INTRAMUSCULAR | Status: DC | PRN

## 2017-03-02 MED ORDER — PANTOPRAZOLE SODIUM 40 MG IV SOLR
40.0000 mg | Freq: Every day | INTRAVENOUS | Status: DC
  Administered 2017-03-02: 40 mg via INTRAVENOUS
  Filled 2017-03-02: qty 40

## 2017-03-02 MED ORDER — MIDAZOLAM HCL 2 MG/2ML IJ SOLN
INTRAMUSCULAR | Status: AC
  Filled 2017-03-02: qty 2

## 2017-03-02 MED ORDER — CEFAZOLIN SODIUM-DEXTROSE 2-4 GM/100ML-% IV SOLN
2.0000 g | INTRAVENOUS | Status: AC
  Administered 2017-03-02: 2 g via INTRAVENOUS
  Filled 2017-03-02: qty 100

## 2017-03-02 MED ORDER — PROPOFOL 10 MG/ML IV BOLUS
INTRAVENOUS | Status: DC | PRN
  Administered 2017-03-02: 200 mg via INTRAVENOUS

## 2017-03-02 MED ORDER — DEXAMETHASONE 4 MG PO TABS: 8.0000 mg | ORAL_TABLET | Freq: Two times a day (BID) | ORAL | Status: DC

## 2017-03-02 MED ORDER — ONDANSETRON HCL 4 MG/2ML IJ SOLN
INTRAMUSCULAR | Status: AC
  Filled 2017-03-02: qty 4

## 2017-03-02 MED ORDER — DEXAMETHASONE SODIUM PHOSPHATE 10 MG/ML IJ SOLN
INTRAMUSCULAR | Status: AC
  Filled 2017-03-02: qty 2

## 2017-03-02 MED ORDER — DEXAMETHASONE SODIUM PHOSPHATE 10 MG/ML IJ SOLN
INTRAMUSCULAR | Status: DC | PRN
  Administered 2017-03-02: 5 mg via INTRAVENOUS

## 2017-03-02 MED ORDER — EPHEDRINE SULFATE 50 MG/ML IJ SOLN
INTRAMUSCULAR | Status: DC | PRN
  Administered 2017-03-02: 10 mg via INTRAVENOUS

## 2017-03-02 MED ORDER — ACETAMINOPHEN 500 MG PO TABS
1000.0000 mg | ORAL_TABLET | ORAL | Status: AC
  Administered 2017-03-02: 1000 mg via ORAL
  Filled 2017-03-02: qty 2

## 2017-03-02 MED ORDER — OXYCODONE HCL 5 MG PO TABS: 5.0000 mg | ORAL_TABLET | Freq: Once | ORAL | Status: DC | PRN

## 2017-03-02 MED ORDER — LACTATED RINGERS IV SOLN
INTRAVENOUS | Status: DC
  Administered 2017-03-02: 09:00:00 via INTRAVENOUS

## 2017-03-02 MED ORDER — MEPERIDINE HCL 25 MG/ML IJ SOLN: 6.2500 mg | INTRAMUSCULAR | Status: DC | PRN

## 2017-03-02 MED ORDER — FENTANYL CITRATE (PF) 100 MCG/2ML IJ SOLN: 25.0000 ug | INTRAMUSCULAR | Status: DC | PRN

## 2017-03-02 MED ORDER — HYDROMORPHONE HCL 1 MG/ML IJ SOLN: 0.2500 mg | INTRAMUSCULAR | Status: DC | PRN

## 2017-03-02 MED ORDER — LIDOCAINE HCL (CARDIAC) 20 MG/ML IV SOLN
INTRAVENOUS | Status: DC | PRN
  Administered 2017-03-02: 80 mg via INTRATRACHEAL

## 2017-03-02 MED ORDER — HYDROCODONE-ACETAMINOPHEN 5-325 MG PO TABS: 1.0000 | ORAL_TABLET | Freq: Four times a day (QID) | ORAL | 0 refills | Status: DC | PRN

## 2017-03-02 MED ORDER — HEPARIN SOD (PORK) LOCK FLUSH 100 UNIT/ML IV SOLN
INTRAVENOUS | Status: AC
  Filled 2017-03-02: qty 5

## 2017-03-02 MED ORDER — FENTANYL CITRATE (PF) 250 MCG/5ML IJ SOLN
INTRAMUSCULAR | Status: AC
  Filled 2017-03-02: qty 5

## 2017-03-02 MED ORDER — PHENYLEPHRINE 40 MCG/ML (10ML) SYRINGE FOR IV PUSH (FOR BLOOD PRESSURE SUPPORT)
PREFILLED_SYRINGE | INTRAVENOUS | Status: AC
  Filled 2017-03-02: qty 10

## 2017-03-02 MED ORDER — MIDAZOLAM HCL 2 MG/2ML IJ SOLN
INTRAMUSCULAR | Status: DC | PRN
  Administered 2017-03-02 (×2): 1 mg via INTRAVENOUS

## 2017-03-02 MED ORDER — ACETAMINOPHEN 500 MG PO TABS: 500.0000 mg | ORAL_TABLET | Freq: Four times a day (QID) | ORAL | Status: DC | PRN

## 2017-03-02 MED ORDER — PROMETHAZINE HCL 25 MG/ML IJ SOLN: 6.2500 mg | INTRAMUSCULAR | Status: DC | PRN

## 2017-03-02 MED ORDER — BUPIVACAINE HCL (PF) 0.25 % IJ SOLN
INTRAMUSCULAR | Status: AC
  Filled 2017-03-02: qty 30

## 2017-03-02 MED ORDER — ONDANSETRON HCL 4 MG/2ML IJ SOLN
INTRAMUSCULAR | Status: DC | PRN
  Administered 2017-03-02: 4 mg via INTRAVENOUS

## 2017-03-02 MED ORDER — SODIUM CHLORIDE 0.9 % IV SOLN
INTRAVENOUS | Status: DC | PRN
  Administered 2017-03-02: 10:00:00

## 2017-03-02 MED ORDER — KCL IN DEXTROSE-NACL 20-5-0.9 MEQ/L-%-% IV SOLN
INTRAVENOUS | Status: DC
  Administered 2017-03-02 (×2): via INTRAVENOUS
  Filled 2017-03-02 (×2): qty 1000

## 2017-03-02 MED ORDER — CELECOXIB 200 MG PO CAPS
200.0000 mg | ORAL_CAPSULE | ORAL | Status: AC
  Administered 2017-03-02: 200 mg via ORAL
  Filled 2017-03-02: qty 1

## 2017-03-02 MED ORDER — FENTANYL CITRATE (PF) 250 MCG/5ML IJ SOLN
INTRAMUSCULAR | Status: DC | PRN
  Administered 2017-03-02: 50 ug via INTRAVENOUS

## 2017-03-02 MED ORDER — ONDANSETRON 4 MG PO TBDP
4.0000 mg | ORAL_TABLET | Freq: Four times a day (QID) | ORAL | Status: DC | PRN
Start: 2017-03-02 — End: 2017-03-03

## 2017-03-02 MED ORDER — HYDROCODONE-ACETAMINOPHEN 5-325 MG PO TABS: 1.0000 | ORAL_TABLET | ORAL | Status: DC | PRN

## 2017-03-02 MED ORDER — PROPOFOL 10 MG/ML IV BOLUS
INTRAVENOUS | Status: AC
  Filled 2017-03-02: qty 20

## 2017-03-02 MED ORDER — HEPARIN SODIUM (PORCINE) 5000 UNIT/ML IJ SOLN
5000.0000 [IU] | Freq: Three times a day (TID) | INTRAMUSCULAR | Status: DC
  Administered 2017-03-03: 5000 [IU] via SUBCUTANEOUS

## 2017-03-02 MED ORDER — 0.9 % SODIUM CHLORIDE (POUR BTL) OPTIME
TOPICAL | Status: DC | PRN
  Administered 2017-03-02: 1000 mL

## 2017-03-02 MED ORDER — GABAPENTIN 300 MG PO CAPS
300.0000 mg | ORAL_CAPSULE | ORAL | Status: AC
  Administered 2017-03-02: 300 mg via ORAL
  Filled 2017-03-02: qty 1

## 2017-03-02 MED ORDER — BUPIVACAINE HCL (PF) 0.25 % IJ SOLN
INTRAMUSCULAR | Status: DC | PRN
  Administered 2017-03-02: 7 mL

## 2017-03-02 SURGICAL SUPPLY — 45 items
BAG DECANTER FOR FLEXI CONT (MISCELLANEOUS) ×2 IMPLANT
BLADE SURG 15 STRL LF DISP TIS (BLADE) ×1 IMPLANT
BLADE SURG 15 STRL SS (BLADE) ×1
CHLORAPREP W/TINT 10.5 ML (MISCELLANEOUS) ×2 IMPLANT
COVER SURGICAL LIGHT HANDLE (MISCELLANEOUS) ×2 IMPLANT
COVER TRANSDUCER ULTRASND GEL (DRAPE) IMPLANT
CRADLE DONUT ADULT HEAD (MISCELLANEOUS) ×2 IMPLANT
DERMABOND ADVANCED (GAUZE/BANDAGES/DRESSINGS) ×1
DERMABOND ADVANCED .7 DNX12 (GAUZE/BANDAGES/DRESSINGS) ×1 IMPLANT
DRAPE C-ARM 42X72 X-RAY (DRAPES) ×2 IMPLANT
DRAPE CHEST BREAST 15X10 FENES (DRAPES) ×2 IMPLANT
DRAPE UTILITY XL STRL (DRAPES) ×4 IMPLANT
ELECT CAUTERY BLADE 6.4 (BLADE) ×2 IMPLANT
ELECT REM PT RETURN 9FT ADLT (ELECTROSURGICAL) ×2
ELECTRODE REM PT RTRN 9FT ADLT (ELECTROSURGICAL) ×1 IMPLANT
GAUZE SPONGE 4X4 16PLY XRAY LF (GAUZE/BANDAGES/DRESSINGS) ×2 IMPLANT
GEL ULTRASOUND 20GR AQUASONIC (MISCELLANEOUS) IMPLANT
GLOVE BIO SURGEON STRL SZ7.5 (GLOVE) ×2 IMPLANT
GOWN STRL REUS W/ TWL LRG LVL3 (GOWN DISPOSABLE) ×2 IMPLANT
GOWN STRL REUS W/TWL LRG LVL3 (GOWN DISPOSABLE) ×2
INTRODUCER COOK 11FR (CATHETERS) IMPLANT
KIT BASIN OR (CUSTOM PROCEDURE TRAY) ×2 IMPLANT
KIT PORT POWER 8FR ISP CVUE (Miscellaneous) ×2 IMPLANT
KIT ROOM TURNOVER OR (KITS) ×2 IMPLANT
NEEDLE 22X1 1/2 (OR ONLY) (NEEDLE) IMPLANT
NEEDLE HYPO 25GX1X1/2 BEV (NEEDLE) ×2 IMPLANT
NS IRRIG 1000ML POUR BTL (IV SOLUTION) ×2 IMPLANT
PACK SURGICAL SETUP 50X90 (CUSTOM PROCEDURE TRAY) ×2 IMPLANT
PAD ARMBOARD 7.5X6 YLW CONV (MISCELLANEOUS) ×2 IMPLANT
PENCIL BUTTON HOLSTER BLD 10FT (ELECTRODE) ×2 IMPLANT
SET INTRODUCER 12FR PACEMAKER (SHEATH) IMPLANT
SET SHEATH INTRODUCER 10FR (MISCELLANEOUS) IMPLANT
SHEATH COOK PEEL AWAY SET 9F (SHEATH) IMPLANT
SUT MNCRL AB 4-0 PS2 18 (SUTURE) ×2 IMPLANT
SUT PROLENE 2 0 SH 30 (SUTURE) ×2 IMPLANT
SUT SILK 2 0 (SUTURE) ×1
SUT SILK 2-0 18XBRD TIE 12 (SUTURE) ×1 IMPLANT
SUT VIC AB 3-0 SH 27 (SUTURE) ×1
SUT VIC AB 3-0 SH 27XBRD (SUTURE) ×1 IMPLANT
SYR 10ML LL (SYRINGE) IMPLANT
SYR 20ML ECCENTRIC (SYRINGE) ×4 IMPLANT
SYR 5ML LUER SLIP (SYRINGE) ×2 IMPLANT
SYR CONTROL 10ML LL (SYRINGE) ×2 IMPLANT
TOWEL OR 17X24 6PK STRL BLUE (TOWEL DISPOSABLE) ×2 IMPLANT
TOWEL OR 17X26 10 PK STRL BLUE (TOWEL DISPOSABLE) ×2 IMPLANT

## 2017-03-02 NOTE — H&P (Signed)
Brooke [name]  Location: [facility] Patient #: [id number] DOB: Jul 05, 1950 Undefined / Language: [name] / Race: White Female   History of Present Illness  The patient is a 67 year old female who presents with breast cancer. we are asked to see the patient in consultation by Dr. Lisbeth Renshaw to evaluate her for a new left breast cancer. The patient is a 67 year old white female who was recently found to have a mass in the upper inner quadrant of the left breast. This measured 3 cm with some additional satellite areas. She had 3 abnormal lymph nodes one of which was biopsied and was positive. Her cancer was a grade 2 invasive ductal cancer that was ER and PR positive and HER-2 negative with a Ki-67 of 20%. She also had a 1 cm area of abnormal calcifications in the right breast that was biopsied and came back as a complex sclerosing lesion. She has a family history of breast cancer in her sister   Past Surgical History Appendectomy  Breast Biopsy  Bilateral. Gallbladder Surgery - Laparoscopic  Oral Surgery  Resection of Stomach   Diagnostic Studies History  Colonoscopy  never Mammogram  within last year Pap Smear  1-5 years ago  Medication History  Medications Reconciled  Social History  Caffeine use  Coffee, Tea. No alcohol use  No drug use  Tobacco use  Never smoker.  Family History Cerebrovascular Accident  Father. Diabetes Mellitus  Father. Hypertension  Mother. Kidney Disease  Mother. Prostate Cancer  Father.  Other Problems Arthritis  Back Pain  Hemorrhoids  Hypercholesterolemia  Kidney Stone  Melanoma  Pulmonary Embolism / Blood Clot in Legs     Review of Systems  General Present- Appetite Loss, Chills and Fatigue. Not Present- Fever, Night Sweats, Weight Gain and Weight Loss. Skin Present- Dryness. Not Present- Change in Wart/Mole, Hives, Jaundice, New Lesions, Non-Healing Wounds, Rash and Ulcer. HEENT Present- Hearing Loss,  Hoarseness and Wears glasses/contact lenses. Not Present- Earache, Nose Bleed, Oral Ulcers, Ringing in the Ears, Seasonal Allergies, Sinus Pain, Sore Throat, Visual Disturbances and Yellow Eyes. Respiratory Present- Snoring. Not Present- Bloody sputum, Chronic Cough, Difficulty Breathing and Wheezing. Breast Present- Breast Mass and Breast Pain. Not Present- Nipple Discharge and Skin Changes. Cardiovascular Present- Leg Cramps. Not Present- Chest Pain, Difficulty Breathing Lying Down, Palpitations, Rapid Heart Rate, Shortness of Breath and Swelling of Extremities. Gastrointestinal Present- Bloating, Change in Bowel Habits, Constipation, Hemorrhoids and Rectal Pain. Not Present- Abdominal Pain, Bloody Stool, Chronic diarrhea, Difficulty Swallowing, Excessive gas, Gets full quickly at meals, Indigestion, Nausea and Vomiting. Female Genitourinary Not Present- Frequency, Nocturia, Painful Urination, Pelvic Pain and Urgency. Musculoskeletal Present- Back Pain, Joint Pain and Joint Stiffness. Not Present- Muscle Pain, Muscle Weakness and Swelling of Extremities. Neurological Not Present- Decreased Memory, Fainting, Headaches, Numbness, Seizures, Tingling, Tremor, Trouble walking and Weakness. Psychiatric Present- Anxiety and Fearful. Not Present- Bipolar, Change in Sleep Pattern, Depression and Frequent crying. Endocrine Present- Hair Changes and Heat Intolerance. Not Present- Cold Intolerance, Excessive Hunger, Hot flashes and New Diabetes. Hematology Not Present- Blood Thinners, Easy Bruising, Excessive bleeding, Gland problems, HIV and Persistent Infections.   Physical Exam  General Mental Status-Alert. General Appearance-Consistent with stated age. Hydration-Well hydrated. Voice-Normal.  Head and Neck Head-normocephalic, atraumatic with no lesions or palpable masses. Trachea-midline. Thyroid Gland Characteristics - normal size and consistency.  Eye Eyeball -  Bilateral-Extraocular movements intact. Sclera/Conjunctiva - Bilateral-No scleral icterus.  Chest and Lung Exam Chest and lung exam reveals -quiet, even and easy  respiratory effort with no use of accessory muscles and on auscultation, normal breath sounds, no adventitious sounds and normal vocal resonance. Inspection Chest Wall - Normal. Back - normal.  Breast Note: there is a 3 cm palpable mass in the upper inner quadrant of the left breast that seems mobile with no overlying skin changes. There is no palpable mass in the right breast. There is palpably enlarged lymph nodes in the left axilla   Cardiovascular Cardiovascular examination reveals -normal heart sounds, regular rate and rhythm with no murmurs and normal pedal pulses bilaterally.  Abdomen Inspection Inspection of the abdomen reveals - No Hernias. Skin - Scar - no surgical scars. Palpation/Percussion Palpation and Percussion of the abdomen reveal - Soft, Non Tender, No Rebound tenderness, No Rigidity (guarding) and No hepatosplenomegaly. Auscultation Auscultation of the abdomen reveals - Bowel sounds normal.  Neurologic Neurologic evaluation reveals -alert and oriented x 3 with no impairment of recent or remote memory. Mental Status-Normal.  Musculoskeletal Normal Exam - Left-Upper Extremity Strength Normal and Lower Extremity Strength Normal. Normal Exam - Right-Upper Extremity Strength Normal and Lower Extremity Strength Normal.  Lymphatic Head & Neck  General Head & Neck Lymphatics: Bilateral - Description - Normal. Axillary  General Axillary Region: Bilateral - Description - Normal. Tenderness - Non Tender. Femoral & Inguinal  Generalized Femoral & Inguinal Lymphatics: Bilateral - Description - Normal. Tenderness - Non Tender.    Assessment & Plan  MALIGNANT NEOPLASM OF UPPER-INNER QUADRANT OF LEFT BREAST IN FEMALE, ESTROGEN RECEPTOR POSITIVE (C50.212) Impression: the patient appears to  have a large cancer in the upper inner quadrant of the left breast with positive lymph nodes. She also has a complex sclerosing lesion in the right breast. Given the size and nature of the cancer in the left breast I think she would benefit from neoadjuvant therapy. I have talked to her about breast conservation versus mastectomy as well as possible targeted node dissection and I believe much of this will depend on how she responds to treatment. We will also obtain an MRI to get a true measure of the area involved. She will also have genetic testing done. She may need a Port-A-Cath. I have discussed with her in detail the risks and benefits of the surgery to place the port as well as some of the technical aspects and she understands and wishes to proceed Current Plans Follow up with [place] in the office in 1 month.  Call us sooner as needed.

## 2017-03-02 NOTE — Anesthesia Postprocedure Evaluation (Signed)
Anesthesia Post Note  Patient: Anapaola L Mccalla  Procedure(s) Performed: INSERTION PORT-A-CATH (N/A Chest)     Patient location during evaluation: PACU Anesthesia Type: General Level of consciousness: awake and alert Pain management: pain level controlled Vital Signs Assessment: post-procedure vital signs reviewed and stable Respiratory status: spontaneous breathing, nonlabored ventilation and respiratory function stable Cardiovascular status: blood pressure returned to baseline and stable Postop Assessment: no apparent nausea or vomiting Anesthetic complications: no    Last Vitals:  Vitals:   03/02/17 1204 03/02/17 1205  BP: 129/67   Pulse: 63   Resp: 16   Temp:  36.7 C  SpO2: 97%     Last Pain:  Vitals:   03/02/17 1205  TempSrc:   PainSc: 0-No pain                 Lynda Rainwater

## 2017-03-02 NOTE — Interval H&P Note (Signed)
History and Physical Interval Note:  03/02/2017 9:45 AM  Brooke [name]  has presented today for surgery, with the diagnosis of LEFT BREAST CANCER  The various methods of treatment have been discussed with the patient and family. After consideration of risks, benefits and other options for treatment, the patient has consented to  Procedure(s): INSERTION PORT-A-CATH (N/A) as a surgical intervention .  The patient's history has been reviewed, patient examined, no change in status, stable for surgery.  I have reviewed the patient's chart and labs.  Questions were answered to the patient's satisfaction.     TOTH III,PAUL S

## 2017-03-02 NOTE — Op Note (Signed)
03/02/2017  10:57 AM  PATIENT:  Brooke [name]  67 y.o. female  PRE-OPERATIVE DIAGNOSIS:  LEFT BREAST CANCER  POST-OPERATIVE DIAGNOSIS:  LEFT BREAST CANCER  PROCEDURE:  Procedure(s): INSERTION PORT-A-CATH, Right subclavian vein  SURGEON:  Surgeon(s) and Role:    * Jovita Kussmaul, MD - Primary  PHYSICIAN ASSISTANT:   ASSISTANTS: none   ANESTHESIA:   local and general  EBL:  5 mL   BLOOD ADMINISTERED:none  DRAINS: none   LOCAL MEDICATIONS USED:  MARCAINE     SPECIMEN:  No Specimen  DISPOSITION OF SPECIMEN:  N/A  COUNTS:  YES  TOURNIQUET:  * No tourniquets in log *  DICTATION: .Dragon Dictation   After informed consent was obtained the patient was brought to the operating room and placed in the supine position on the operating table.  After adequate induction of general anesthesia the patient's right chest and breast area were prepped with ChloraPrep, allowed to dry, and draped in usual sterile manner.  A roll was placed between the patient's shoulder blades to extend the shoulder slightly.  The patient was placed in Trendelenburg position.  An appropriate timeout was performed.  The area lateral to the bend of the clavicle on the right chest was infiltrated with quarter percent Marcaine.  A large bore needle from the Port-A-Cath kit was used to slide beneath the bend of the clavicle heading towards the sternal notch and in doing so I was able to vein without difficulty.  A wire was fed through the needle using the Seldinger technique without difficulty.  The wire was confirmed in the central venous system using real-time fluoroscopy.  Next a small incision was made along the right chest wall at the wire entry site with a 15 blade knife.  The incision was carried through the skin and subcutaneous tissue sharply with electrocautery.  A subcutaneous pocket was created inferior to the incision by blunt finger dissection.  Next the tubing was placed on the reservoir.  The reservoir was  placed in the pocket and the length of the tubing was estimated using real-time fluoroscopy and cut to the appropriate length.  Next a sheath and dilator were fed over the wire using the Seldinger technique without difficulty.  The dilator and wire were removed.  The tubing was fed through the sheath as far as it would go and then held in place while the sheath was gently cracked and separated.  Another real-time fluoroscopy image showed the tip of the catheter to be in the distal superior vena cava.  The tubing was then permanently anchored to the reservoir.  The reservoir was anchored in the pocket with 2 2-0 Prolene stitches.  The port was then aspirated and it aspirated blood easily.  The port was then flushed initially with a dilute heparin solution and then with a more concentrated heparin solution.  The subcutaneous tissue was then closed over the port with interrupted 3-0 Vicryl stitches.  The skin was then closed with a running 4-0 Monocryl subcuticular stitch.  Dermabond dressings were applied.  The patient tolerated the procedure well.  At the end of the case all needle sponge and instrument counts were correct.  The patient was awakened and taken to recovery in stable condition.  PLAN OF CARE: Admit for overnight observation  PATIENT DISPOSITION:  PACU - hemodynamically stable.   Delay start of Pharmacological VTE agent (>24hrs) due to surgical blood loss or risk of bleeding: not applicable

## 2017-03-02 NOTE — Anesthesia Procedure Notes (Signed)
Procedure Name: LMA Insertion Date/Time: 03/02/2017 10:21 AM Performed by: Julieta Bellini, CRNA Pre-anesthesia Checklist: Patient identified, Emergency Drugs available, Suction available and Patient being monitored Patient Re-evaluated:Patient Re-evaluated prior to induction Oxygen Delivery Method: Circle system utilized Preoxygenation: Pre-oxygenation with 100% oxygen Induction Type: IV induction Ventilation: Mask ventilation without difficulty LMA: LMA inserted LMA Size: 4.0 Number of attempts: 1 Placement Confirmation: positive ETCO2 and breath sounds checked- equal and bilateral Tube secured with: Tape Dental Injury: Teeth and Oropharynx as per pre-operative assessment

## 2017-03-02 NOTE — Transfer of Care (Signed)
Immediate Anesthesia Transfer of Care Note  Patient: Brooke [name]  Procedure(s) Performed: INSERTION PORT-A-CATH (N/A Chest)  Patient Location: PACU  Anesthesia Type:General  Level of Consciousness: drowsy and patient cooperative  Airway & Oxygen Therapy: Patient Spontanous Breathing and Patient connected to nasal cannula oxygen  Post-op Assessment: Report given to RN, Post -op Vital signs reviewed and stable and Patient moving all extremities X 4  Post vital signs: Reviewed and stable  Last Vitals:  Vitals:   03/02/17 0813 03/02/17 1106  BP: (!) 156/70   Pulse: 87   Resp: 18   Temp: 36.9 C (P) 36.5 C  SpO2: 96%     Last Pain:  Vitals:   03/02/17 0842  TempSrc:   PainSc: 4          Complications: No apparent anesthesia complications

## 2017-03-02 NOTE — Telephone Encounter (Signed)
Scheduled appt per 12/31 sch message - Patient to get an updated schedule next visit.

## 2017-03-02 NOTE — Anesthesia Preprocedure Evaluation (Signed)
Anesthesia Evaluation  Patient identified by MRN, date of birth, ID band Patient awake    Reviewed: Allergy & Precautions, NPO status , Patient's Chart, lab work & pertinent test results  History of Anesthesia Complications (+) PONV  Airway Mallampati: II  TM Distance: >3 FB Neck ROM: Full    Dental no notable dental hx.    Pulmonary neg pulmonary ROS,    Pulmonary exam normal breath sounds clear to auscultation       Cardiovascular negative cardio ROS Normal cardiovascular exam Rhythm:Regular Rate:Normal     Neuro/Psych Anxiety negative neurological ROS  negative psych ROS   GI/Hepatic negative GI ROS, Neg liver ROS,   Endo/Other  negative endocrine ROS  Renal/GU negative Renal ROS  negative genitourinary   Musculoskeletal negative musculoskeletal ROS (+) Arthritis , Osteoarthritis,    Abdominal   Peds negative pediatric ROS (+)  Hematology negative hematology ROS (+)   Anesthesia Other Findings Breast cancer  Reproductive/Obstetrics negative OB ROS                             Anesthesia Physical Anesthesia Plan  ASA: III  Anesthesia Plan: General   Post-op Pain Management:    Induction: Intravenous  PONV Risk Score and Plan: 4 or greater and Treatment may vary due to age or medical condition, Ondansetron, Dexamethasone, Midazolam and Scopolamine patch - Pre-op  Airway Management Planned: LMA  Additional Equipment:   Intra-op Plan:   Post-operative Plan: Extubation in OR  Informed Consent: I have reviewed the patients History and Physical, chart, labs and discussed the procedure including the risks, benefits and alternatives for the proposed anesthesia with the patient or authorized representative who has indicated his/her understanding and acceptance.   Dental advisory given  Plan Discussed with: CRNA  Anesthesia Plan Comments:         Anesthesia Quick  Evaluation

## 2017-03-03 ENCOUNTER — Encounter: Payer: Self-pay | Admitting: General Practice

## 2017-03-03 ENCOUNTER — Encounter (HOSPITAL_COMMUNITY): Payer: Self-pay | Admitting: General Surgery

## 2017-03-03 ENCOUNTER — Ambulatory Visit (HOSPITAL_BASED_OUTPATIENT_CLINIC_OR_DEPARTMENT_OTHER): Payer: Medicare Other

## 2017-03-03 ENCOUNTER — Encounter: Payer: Self-pay | Admitting: *Deleted

## 2017-03-03 ENCOUNTER — Ambulatory Visit (HOSPITAL_BASED_OUTPATIENT_CLINIC_OR_DEPARTMENT_OTHER): Payer: BC Managed Care – PPO | Admitting: Medical

## 2017-03-03 ENCOUNTER — Other Ambulatory Visit (HOSPITAL_BASED_OUTPATIENT_CLINIC_OR_DEPARTMENT_OTHER): Payer: Medicare Other

## 2017-03-03 ENCOUNTER — Other Ambulatory Visit: Payer: BC Managed Care – PPO

## 2017-03-03 VITALS — BP 131/63 | HR 58 | Temp 98.5°F | Resp 16

## 2017-03-03 DIAGNOSIS — C50212 Malignant neoplasm of upper-inner quadrant of left female breast: Secondary | ICD-10-CM | POA: Diagnosis not present

## 2017-03-03 DIAGNOSIS — C50812 Malignant neoplasm of overlapping sites of left female breast: Secondary | ICD-10-CM

## 2017-03-03 DIAGNOSIS — T451X5A Adverse effect of antineoplastic and immunosuppressive drugs, initial encounter: Secondary | ICD-10-CM

## 2017-03-03 DIAGNOSIS — Z170 Estrogen receptor positive status [ER+]: Principal | ICD-10-CM

## 2017-03-03 DIAGNOSIS — Z5111 Encounter for antineoplastic chemotherapy: Secondary | ICD-10-CM

## 2017-03-03 DIAGNOSIS — E7800 Pure hypercholesterolemia, unspecified: Secondary | ICD-10-CM | POA: Diagnosis not present

## 2017-03-03 DIAGNOSIS — Z86711 Personal history of pulmonary embolism: Secondary | ICD-10-CM | POA: Diagnosis not present

## 2017-03-03 DIAGNOSIS — Z5189 Encounter for other specified aftercare: Secondary | ICD-10-CM | POA: Diagnosis not present

## 2017-03-03 DIAGNOSIS — Z803 Family history of malignant neoplasm of breast: Secondary | ICD-10-CM | POA: Diagnosis not present

## 2017-03-03 DIAGNOSIS — Z79899 Other long term (current) drug therapy: Secondary | ICD-10-CM | POA: Diagnosis not present

## 2017-03-03 LAB — COMPREHENSIVE METABOLIC PANEL
ALT: 25 U/L (ref 0–55)
ANION GAP: 7 meq/L (ref 3–11)
AST: 23 U/L (ref 5–34)
Albumin: 3.7 g/dL (ref 3.5–5.0)
Alkaline Phosphatase: 71 U/L (ref 40–150)
BUN: 12.7 mg/dL (ref 7.0–26.0)
CALCIUM: 9.4 mg/dL (ref 8.4–10.4)
CHLORIDE: 107 meq/L (ref 98–109)
CO2: 28 mEq/L (ref 22–29)
Creatinine: 0.9 mg/dL (ref 0.6–1.1)
EGFR: >60 mL/min/{1.73_m2} (ref >60)
Glucose: 107 mg/dl (ref 70–140)
POTASSIUM: 3.3 meq/L — AB (ref 3.5–5.1)
Sodium: 141 mEq/L (ref 136–145)
Total Bilirubin: 0.52 mg/dL (ref 0.20–1.20)
Total Protein: 6.5 g/dL (ref 6.4–8.3)

## 2017-03-03 LAB — CBC WITH DIFFERENTIAL/PLATELET
BASO%: 0.2 % (ref 0.0–2.0)
Basophils Absolute: 0 10*3/uL (ref 0.0–0.1)
EOS%: 1.3 % (ref 0.0–7.0)
Eosinophils Absolute: 0.1 10*3/uL (ref 0.0–0.5)
HEMATOCRIT: 39.7 % (ref 34.8–46.6)
HGB: 13 g/dL (ref 11.6–15.9)
LYMPH#: 2.4 10*3/uL (ref 0.9–3.3)
LYMPH%: 27.6 % (ref 14.0–49.7)
MCH: 29.3 pg (ref 25.1–34.0)
MCHC: 32.7 g/dL (ref 31.5–36.0)
MCV: 89.6 fL (ref 79.5–101.0)
MONO#: 0.7 10*3/uL (ref 0.1–0.9)
MONO%: 8.3 % (ref 0.0–14.0)
NEUT%: 62.6 % (ref 38.4–76.8)
NEUTROS ABS: 5.4 10*3/uL (ref 1.5–6.5)
PLATELETS: 281 10*3/uL (ref 145–400)
RBC: 4.43 10*6/uL (ref 3.70–5.45)
RDW: 13.2 % (ref 11.2–14.5)
WBC: 8.7 10*3/uL (ref 3.9–10.3)

## 2017-03-03 LAB — DRAW EXTRA CLOT TUBE

## 2017-03-03 MED ORDER — FAMOTIDINE IN NACL 20-0.9 MG/50ML-% IV SOLN
20.0000 mg | Freq: Once | INTRAVENOUS | Status: AC | PRN
  Administered 2017-03-03: 20 mg via INTRAVENOUS

## 2017-03-03 MED ORDER — HYDROCODONE-ACETAMINOPHEN 5-325 MG PO TABS: 1.0000 | ORAL_TABLET | Freq: Four times a day (QID) | ORAL | 0 refills | Status: DC | PRN

## 2017-03-03 MED ORDER — PEGFILGRASTIM 6 MG/0.6ML ~~LOC~~ PSKT
PREFILLED_SYRINGE | SUBCUTANEOUS | Status: AC
  Filled 2017-03-03: qty 0.6

## 2017-03-03 MED ORDER — PALONOSETRON HCL INJECTION 0.25 MG/5ML
0.2500 mg | Freq: Once | INTRAVENOUS | Status: AC
  Administered 2017-03-03: 0.25 mg via INTRAVENOUS

## 2017-03-03 MED ORDER — METHYLPREDNISOLONE SODIUM SUCC 125 MG IJ SOLR
125.0000 mg | Freq: Once | INTRAMUSCULAR | Status: AC | PRN
  Administered 2017-03-03: 125 mg via INTRAVENOUS

## 2017-03-03 MED ORDER — PEGFILGRASTIM 6 MG/0.6ML ~~LOC~~ PSKT
6.0000 mg | PREFILLED_SYRINGE | Freq: Once | SUBCUTANEOUS | Status: AC
  Administered 2017-03-03: 6 mg via SUBCUTANEOUS

## 2017-03-03 MED ORDER — SODIUM CHLORIDE 0.9 % IV SOLN: Freq: Once | INTRAVENOUS | Status: DC | PRN

## 2017-03-03 MED ORDER — SODIUM CHLORIDE 0.9% FLUSH
10.0000 mL | INTRAVENOUS | Status: DC | PRN
  Administered 2017-03-03: 10 mL
  Filled 2017-03-03: qty 10

## 2017-03-03 MED ORDER — DEXAMETHASONE SODIUM PHOSPHATE 10 MG/ML IJ SOLN
10.0000 mg | Freq: Once | INTRAMUSCULAR | Status: AC
  Administered 2017-03-03: 10 mg via INTRAVENOUS

## 2017-03-03 MED ORDER — DEXAMETHASONE SODIUM PHOSPHATE 10 MG/ML IJ SOLN
INTRAMUSCULAR | Status: AC
  Filled 2017-03-03: qty 1

## 2017-03-03 MED ORDER — HEPARIN SOD (PORK) LOCK FLUSH 100 UNIT/ML IV SOLN
500.0000 [IU] | Freq: Once | INTRAVENOUS | Status: AC | PRN
  Administered 2017-03-03: 500 [IU]
  Filled 2017-03-03: qty 5

## 2017-03-03 MED ORDER — PALONOSETRON HCL INJECTION 0.25 MG/5ML
INTRAVENOUS | Status: AC
  Filled 2017-03-03: qty 5

## 2017-03-03 MED ORDER — ONDANSETRON HCL 8 MG PO TABS
8.0000 mg | ORAL_TABLET | Freq: Two times a day (BID) | ORAL | 1 refills | Status: DC | PRN
Start: 2017-03-03 — End: 2017-03-13

## 2017-03-03 MED ORDER — SODIUM CHLORIDE 0.9 % IV SOLN
Freq: Once | INTRAVENOUS | Status: AC
  Administered 2017-03-03: 15:00:00 via INTRAVENOUS

## 2017-03-03 MED ORDER — DIPHENHYDRAMINE HCL 50 MG/ML IJ SOLN
25.0000 mg | Freq: Once | INTRAMUSCULAR | Status: AC | PRN
  Administered 2017-03-03: 25 mg via INTRAVENOUS

## 2017-03-03 MED ORDER — SODIUM CHLORIDE 0.9 % IV SOLN
600.0000 mg/m2 | Freq: Once | INTRAVENOUS | Status: AC
  Administered 2017-03-03: 1360 mg via INTRAVENOUS
  Filled 2017-03-03: qty 68

## 2017-03-03 MED ORDER — SODIUM CHLORIDE 0.9 % IV SOLN
75.0000 mg/m2 | Freq: Once | INTRAVENOUS | Status: AC
  Administered 2017-03-03: 170 mg via INTRAVENOUS
  Filled 2017-03-03: qty 17

## 2017-03-03 NOTE — Progress Notes (Signed)
1 Day Post-Op   Subjective/Chief Complaint: No complaints   Objective: Vital signs in last 24 hours: Temp:  [97.5 F (36.4 C)-98.7 F (37.1 C)] 97.5 F (36.4 C) (01/04 0429) Pulse Rate:  [55-87] 57 (01/04 0429) Resp:  [13-18] 18 (01/04 0429) BP: (114-156)/(53-73) 116/57 (01/04 0429) SpO2:  [93 %-100 %] 97 % (01/04 0429) Weight:  [98 kg (216 lb)] 98 kg (216 lb) (01/03 1555) Last BM Date: 02/28/17  Intake/Output from previous day: 01/03 0701 - 01/04 0700 In: 2323.3 [P.O.:1422; I.V.:901.3] Out: 3428 [Urine:1800; Blood:5] Intake/Output this shift: No intake/output data recorded.  General appearance: alert and cooperative Resp: clear to auscultation bilaterally Cardio: regular rate and rhythm GI: soft, non-tender; bowel sounds normal; no masses,  no organomegaly  Lab Results:  Recent Labs    03/01/17 1013  WBC 6.1  HGB 14.2  HCT 43.0  PLT 304   BMET Recent Labs    03/01/17 1013  NA 137  K 3.4*  CL 101  CO2 28  GLUCOSE 100*  BUN 8  CREATININE 0.79  CALCIUM 9.6   PT/INR No results for input(s): LABPROT, INR in the last 72 hours. ABG No results for input(s): PHART, HCO3 in the last 72 hours.  Invalid input(s): PCO2, PO2  Studies/Results: Dg Chest Port 1 View  Result Date: 03/02/2017 CLINICAL DATA:  Status post Port-A-Cath placement EXAM: PORTABLE CHEST 1 VIEW COMPARISON:  None. FINDINGS: There is a right-sided Port-A-Cath with the tip projecting over the SVC. There is no focal parenchymal opacity. There is no pleural effusion or pneumothorax. The heart and mediastinal contours are unremarkable. The osseous structures are unremarkable. IMPRESSION: No active disease. Electronically Signed   By: Kathreen Devoid   On: 03/02/2017 12:23   Dg Fluoro Guide Cv Line-no Report  Result Date: 03/02/2017 Fluoroscopy was utilized by the requesting physician.  No radiographic interpretation.    Anti-infectives: Anti-infectives (From admission, onward)   Start     Dose/Rate  Route Frequency Ordered Stop   03/02/17 0823  ceFAZolin (ANCEF) IVPB 2g/100 mL premix     2 g 200 mL/hr over 30 Minutes Intravenous On call to O.R. 03/02/17 0823 03/02/17 1026      Assessment/Plan: s/p Procedure(s): INSERTION PORT-A-CATH (N/A) Advance diet Discharge  LOS: 0 days    TOTH III,[place] Dresser S 03/03/2017

## 2017-03-03 NOTE — Progress Notes (Signed)
0924 Patient discharged to home. Verbalizes understanding of all discharge instructions including incision care, discharge medications, and follow up MD visits. Patient accompanied by friend who will transport her home.

## 2017-03-03 NOTE — Progress Notes (Signed)
[place] CSW Progress Note  CSW spoke w patient to assess support available for treatment - had support person w her, "they have all been great", states friends and colleagues have been stepping in to help as needed.  Encouraged pt to access [place] as needed, gave calendar and contact information.  Edwyna Shell, LCSW Clinical Social Worker Phone:  [phone]

## 2017-03-03 NOTE — Progress Notes (Signed)
1605- Patient complained of tingling in lips and cheeks and chest discomfort. Docetaxel stopped and Sandi Mealy, PA notified. Normal saline infusing wide open. Per Sandi Mealy, PA Benadryl 25mg  IV, Pepcid 20mg  IVPB, and Solumedrol 125mg  given IV. O2 via nasal cannula at 1 liter. 1615- patient stated no tingling in lips or tongue. VSS. 1630-BP- 129/88, Pulse- 67, O2 sat- 97%, 1630-Observe x 30 minutes then re-challenge per Sandi Mealy, PA.

## 2017-03-03 NOTE — Progress Notes (Signed)
Per Val, RN- Dr. Virgie Dad nurse, it is ok to release orders for treatment using prior BMP and CBC.

## 2017-03-03 NOTE — Patient Instructions (Signed)
[facility]charge Instructions for Patients Receiving Chemotherapy  Today you received the following chemotherapy agents Docetaxel, Cytoxan.  To help prevent nausea and vomiting after your treatment, we encourage you to take your nausea medication as prescribed.  If you develop nausea and vomiting that is not controlled by your nausea medication, call the clinic.   BELOW ARE SYMPTOMS THAT SHOULD BE REPORTED IMMEDIATELY:  *FEVER GREATER THAN 100.5 F  *CHILLS WITH OR WITHOUT FEVER  NAUSEA AND VOMITING THAT IS NOT CONTROLLED WITH YOUR NAUSEA MEDICATION  *UNUSUAL SHORTNESS OF BREATH  *UNUSUAL BRUISING OR BLEEDING  TENDERNESS IN MOUTH AND THROAT WITH OR WITHOUT PRESENCE OF ULCERS  *URINARY PROBLEMS  *BOWEL PROBLEMS  UNUSUAL RASH Items with * indicate a potential emergency and should be followed up as soon as possible.  Feel free to call the clinic should you have any questions or concerns. The clinic phone number is [phone].  Please show the [facility] at check-in to the Emergency Department and triage nurse.  Docetaxel injection What is this medicine? DOCETAXEL (doe se TAX el) is a chemotherapy drug. It targets fast dividing cells, like cancer cells, and causes these cells to die. This medicine is used to treat many types of cancers like breast cancer, certain stomach cancers, head and neck cancer, lung cancer, and prostate cancer. This medicine may be used for other purposes; ask your health care provider or pharmacist if you have questions. COMMON BRAND NAME(S): Docefrez, Taxotere What should I tell my health care provider before I take this medicine? They need to know if you have any of these conditions: -infection (especially a virus infection such as chickenpox, cold sores, or herpes) -liver disease -low blood counts, like low white cell, platelet, or red cell counts -an unusual or allergic reaction to docetaxel, polysorbate 80, other  chemotherapy agents, other medicines, foods, dyes, or preservatives -pregnant or trying to get pregnant -breast-feeding How should I use this medicine? This drug is given as an infusion into a vein. It is administered in a hospital or clinic by a specially trained health care professional. Talk to your pediatrician regarding the use of this medicine in children. Special care may be needed. Overdosage: If you think you have taken too much of this medicine contact a poison control center or emergency room at once. NOTE: This medicine is only for you. Do not share this medicine with others. What if I miss a dose? It is important not to miss your dose. Call your doctor or health care professional if you are unable to keep an appointment. What may interact with this medicine? -cyclosporine -erythromycin -ketoconazole -medicines to increase blood counts like filgrastim, pegfilgrastim, sargramostim -vaccines Talk to your doctor or health care professional before taking any of these medicines: -acetaminophen -aspirin -ibuprofen -ketoprofen -naproxen This list may not describe all possible interactions. Give your health care provider a list of all the medicines, herbs, non-prescription drugs, or dietary supplements you use. Also tell them if you smoke, drink alcohol, or use illegal drugs. Some items may interact with your medicine. What should I watch for while using this medicine? Your condition will be monitored carefully while you are receiving this medicine. You will need important blood work done while you are taking this medicine. This drug may make you feel generally unwell. This is not uncommon, as chemotherapy can affect healthy cells as well as cancer cells. Report any side effects. Continue your course of treatment even though you feel ill unless  your doctor tells you to stop. In some cases, you may be given additional medicines to help with side effects. Follow all directions for their  use. Call your doctor or health care professional for advice if you get a fever, chills or sore throat, or other symptoms of a cold or flu. Do not treat yourself. This drug decreases your body's ability to fight infections. Try to avoid being around people who are sick. This medicine may increase your risk to bruise or bleed. Call your doctor or health care professional if you notice any unusual bleeding. This medicine may contain alcohol in the product. You may get drowsy or dizzy. Do not drive, use machinery, or do anything that needs mental alertness until you know how this medicine affects you. Do not stand or sit up quickly, especially if you are an older patient. This reduces the risk of dizzy or fainting spells. Avoid alcoholic drinks. Do not become pregnant while taking this medicine. Women should inform their doctor if they wish to become pregnant or think they might be pregnant. There is a potential for serious side effects to an unborn child. Talk to your health care professional or pharmacist for more information. Do not breast-feed an infant while taking this medicine. What side effects may I notice from receiving this medicine? Side effects that you should report to your doctor or health care professional as soon as possible: -allergic reactions like skin rash, itching or hives, swelling of the face, lips, or tongue -low blood counts - This drug may decrease the number of white blood cells, red blood cells and platelets. You may be at increased risk for infections and bleeding. -signs of infection - fever or chills, cough, sore throat, pain or difficulty passing urine -signs of decreased platelets or bleeding - bruising, pinpoint red spots on the skin, black, tarry stools, nosebleeds -signs of decreased red blood cells - unusually weak or tired, fainting spells, lightheadedness -breathing problems -fast or irregular heartbeat -low blood pressure -mouth sores -nausea and vomiting -pain,  swelling, redness or irritation at the injection site -pain, tingling, numbness in the hands or feet -swelling of the ankle, feet, hands -weight gain Side effects that usually do not require medical attention (report to your doctor or health care professional if they continue or are bothersome): -bone pain -complete hair loss including hair on your head, underarms, pubic hair, eyebrows, and eyelashes -diarrhea -excessive tearing -changes in the color of fingernails -loosening of the fingernails -nausea -muscle pain -red flush to skin -sweating -weak or tired This list may not describe all possible side effects. Call your doctor for medical advice about side effects. You may report side effects to FDA at 1-800-FDA-1088. Where should I keep my medicine? This drug is given in a hospital or clinic and will not be stored at home. NOTE: This sheet is a summary. It may not cover all possible information. If you have questions about this medicine, talk to your doctor, pharmacist, or health care provider.  2018 [employer]/Gold Standard (2015-03-19 12:32:56)  Cyclophosphamide injection What is this medicine? CYCLOPHOSPHAMIDE (sye kloe FOSS fa mide) is a chemotherapy drug. It slows the growth of cancer cells. This medicine is used to treat many types of cancer like lymphoma, myeloma, leukemia, breast cancer, and ovarian cancer, to name a few. This medicine may be used for other purposes; ask your health care provider or pharmacist if you have questions. COMMON BRAND NAME(S): Cytoxan, Neosar What should I tell my health care provider before  I take this medicine? They need to know if you have any of these conditions: -blood disorders -history of other chemotherapy -infection -kidney disease -liver disease -recent or ongoing radiation therapy -tumors in the bone marrow -an unusual or allergic reaction to cyclophosphamide, other chemotherapy, other medicines, foods, dyes, or  preservatives -pregnant or trying to get pregnant -breast-feeding How should I use this medicine? This drug is usually given as an injection into a vein or muscle or by infusion into a vein. It is administered in a hospital or clinic by a specially trained health care professional. Talk to your pediatrician regarding the use of this medicine in children. Special care may be needed. Overdosage: If you think you have taken too much of this medicine contact a poison control center or emergency room at once. NOTE: This medicine is only for you. Do not share this medicine with others. What if I miss a dose? It is important not to miss your dose. Call your doctor or health care professional if you are unable to keep an appointment. What may interact with this medicine? This medicine may interact with the following medications: -amiodarone -amphotericin B -azathioprine -certain antiviral medicines for HIV or AIDS such as protease inhibitors (e.g., indinavir, ritonavir) and zidovudine -certain blood pressure medications such as benazepril, captopril, enalapril, fosinopril, lisinopril, moexipril, monopril, perindopril, quinapril, ramipril, trandolapril -certain cancer medications such as anthracyclines (e.g., daunorubicin, doxorubicin), busulfan, cytarabine, paclitaxel, pentostatin, tamoxifen, trastuzumab -certain diuretics such as chlorothiazide, chlorthalidone, hydrochlorothiazide, indapamide, metolazone -certain medicines that treat or prevent blood clots like warfarin -certain muscle relaxants such as succinylcholine -cyclosporine -etanercept -indomethacin -medicines to increase blood counts like filgrastim, pegfilgrastim, sargramostim -medicines used as general anesthesia -metronidazole -natalizumab This list may not describe all possible interactions. Give your health care provider a list of all the medicines, herbs, non-prescription drugs, or dietary supplements you use. Also tell them if  you smoke, drink alcohol, or use illegal drugs. Some items may interact with your medicine. What should I watch for while using this medicine? Visit your doctor for checks on your progress. This drug may make you feel generally unwell. This is not uncommon, as chemotherapy can affect healthy cells as well as cancer cells. Report any side effects. Continue your course of treatment even though you feel ill unless your doctor tells you to stop. Drink water or other fluids as directed. Urinate often, even at night. In some cases, you may be given additional medicines to help with side effects. Follow all directions for their use. Call your doctor or health care professional for advice if you get a fever, chills or sore throat, or other symptoms of a cold or flu. Do not treat yourself. This drug decreases your body's ability to fight infections. Try to avoid being around people who are sick. This medicine may increase your risk to bruise or bleed. Call your doctor or health care professional if you notice any unusual bleeding. Be careful brushing and flossing your teeth or using a toothpick because you may get an infection or bleed more easily. If you have any dental work done, tell your dentist you are receiving this medicine. You may get drowsy or dizzy. Do not drive, use machinery, or do anything that needs mental alertness until you know how this medicine affects you. Do not become pregnant while taking this medicine or for 1 year after stopping it. Women should inform their doctor if they wish to become pregnant or think they might be pregnant. Men should not father  a child while taking this medicine and for 4 months after stopping it. There is a potential for serious side effects to an unborn child. Talk to your health care professional or pharmacist for more information. Do not breast-feed an infant while taking this medicine. This medicine may interfere with the ability to have a child. This medicine  has caused ovarian failure in some women. This medicine has caused reduced sperm counts in some men. You should talk with your doctor or health care professional if you are concerned about your fertility. If you are going to have surgery, tell your doctor or health care professional that you have taken this medicine. What side effects may I notice from receiving this medicine? Side effects that you should report to your doctor or health care professional as soon as possible: -allergic reactions like skin rash, itching or hives, swelling of the face, lips, or tongue -low blood counts - this medicine may decrease the number of white blood cells, red blood cells and platelets. You may be at increased risk for infections and bleeding. -signs of infection - fever or chills, cough, sore throat, pain or difficulty passing urine -signs of decreased platelets or bleeding - bruising, pinpoint red spots on the skin, black, tarry stools, blood in the urine -signs of decreased red blood cells - unusually weak or tired, fainting spells, lightheadedness -breathing problems -dark urine -dizziness -palpitations -swelling of the ankles, feet, hands -trouble passing urine or change in the amount of urine -weight gain -yellowing of the eyes or skin Side effects that usually do not require medical attention (report to your doctor or health care professional if they continue or are bothersome): -changes in nail or skin color -hair loss -missed menstrual periods -mouth sores -nausea, vomiting This list may not describe all possible side effects. Call your doctor for medical advice about side effects. You may report side effects to FDA at 1-800-FDA-1088. Where should I keep my medicine? This drug is given in a hospital or clinic and will not be stored at home. NOTE: This sheet is a summary. It may not cover all possible information. If you have questions about this medicine, talk to your doctor, pharmacist, or  health care provider.  2018 [employer]/Gold Standard (2011-12-30 16:22:58)

## 2017-03-03 NOTE — Progress Notes (Signed)
[facility] Progress Note   Brooke [name] [id number] 01/08/1951 67 y.o.  Brooke [name] is managed by Dr. [name]  Actively treated with chemotherapy: yes  Current Therapy: Cytoxan and docetaxel  Last Treated: 03/03/2017  Assessment: Plan:    Chemotherapy adverse reaction, initial encounter  Brooke [name] was seen in the infusion room for a suspected chemotherapy reaction. She was receiving docetaxel at the time of her reaction. Her symptoms included: Chest tightness, facial pressure, and perioral tingling. She was premedicated with Decadron 20 mg IV and Aloxi prior to starting chemotherapy. Docetaxel was paused and Brooke [name] was given Benadryl 25 mg IV, Pepcid 20 mg IV, and Solu-Medrol 125 mg IV after onset of her symptoms.  She additionally was given supplemental oxygen. Brooke [name] did  respond to intervention.   Please see After Visit Summary for patient specific instructions.  Future Appointments  Date Time Provider [place]  03/09/2017  1:15 PM [facility] LAB 2 [facility] None  03/09/2017  1:30 PM [facility] J32 DNS [facility] None  03/09/2017  2:00 PM Magrinat, Virgie Dad, MD [facility] None  03/16/2017 11:30 AM Fontaine, Belinda Block, MD [facility]  03/24/2017 12:15 PM [facility] LAB 1 [facility] None  03/24/2017 12:30 PM [facility] J32 DNS [facility] None  03/24/2017  1:00 PM Gardenia Phlegm, NP [facility] None  03/24/2017  2:00 PM [facility] I27 DNS [facility] None  03/31/2017  9:00 AM [facility] LAB 5 [facility] None  03/31/2017  9:30 AM Magrinat, Virgie Dad, MD [facility] None  04/14/2017 12:15 PM [facility] LAB 3 [facility] None  04/14/2017 12:30 PM [facility] FLUSH NURSE 2 [facility] None  04/14/2017  1:00 PM Causey, Charlestine Massed, NP [facility] None  04/14/2017  2:00 PM [facility] H30 [facility] None  04/21/2017  9:00 AM [facility] INJ NURSE [facility] None  04/21/2017  9:30 AM Magrinat, Virgie Dad, MD  [facility] None  05/05/2017 12:15 PM [facility] LAB 1 [facility] None  05/05/2017 12:30 PM [facility] FLUSH NURSE [facility] None  05/05/2017  1:00 PM Causey, Charlestine Massed, NP [facility] None  05/05/2017  2:00 PM [facility] I25 DNS [facility] None  05/12/2017  9:00 AM [facility] LAB 3 [facility] None  05/12/2017  9:30 AM Magrinat, Virgie Dad, MD [facility] None    No orders of the defined types were placed in this encounter.      Subjective:   Patient ID:  Brooke [name] is a 67 y.o. (DOB 27-Apr-1950) female.  Chief Complaint: No chief complaint on file.   HPI Brooke [name] was seen in the infusion room for a suspected reaction to chemotherapy. She was receiving docetaxel at the time of her reaction. Her symptoms included: Chest tightness, facial pressure, and perioral tingling. She was premedicated with Decadron 20 mg IV and Aloxi prior to starting chemotherapy. Docetaxel was paused and Brooke [name] was given Benadryl 25 mg IV, Pepcid 20 mg IV, and Solu-Medrol 125 mg IV after onset of her symptoms.  She additionally was given supplemental oxygen. Brooke [name] did  respond to intervention.   Medications: I have reviewed the patient's current medications.  Allergies:  Allergies  Allergen Reactions  . Neosporin [Neomycin-Bacitracin Zn-Polymyx] Swelling, Rash and Other (See Comments)    Blisters  . Benzalkonium Chloride Other (See Comments)    Redness  . Latex Rash    Past Medical History:  Diagnosis Date  . Anemia   . Anxiety   . Arthritis   . Bilateral dry eyes   . Cancer ([place])   .  Family history of breast cancer 02/02/2017  . Family history of colon cancer 02/02/2017  . Family history of prostate cancer in father 02/02/2017  . Fibroid   . History of kidney stones   . Hyperlipidemia   . Kidney stones   . Melanoma of upper arm ([place]) 2009   RIGHT ARM ; left breast cancer, squamous cell carcinoma of the skin  . Obesity (BMI 30.0-34.9)   . Osteopenia 05/2015   T  score -1.3 FRAX 6%/0.5% stable from prior DEXA  . PONV (postoperative nausea and vomiting)   . UTI (urinary tract infection)   . Varicose veins of both lower extremities   . Vitamin D deficiency 07/2009   LOW VITAMIN D 29  . Vitamin deficiency 07/2008   VITAMIN D LOW 23    Past Surgical History:  Procedure Laterality Date  . ABDOMINAL SURGERY  1975   ovairan cyst   . APPENDECTOMY    . BREAST SURGERY     bio  . CHOLECYSTECTOMY    . DERMOID CYST  EXCISION    . DILATION AND CURETTAGE OF UTERUS  1998  . GALLBLADDER SURGERY    . HYSTEROSCOPY    . insertion  port a cath  03/02/2016  . PORTACATH PLACEMENT N/A 03/02/2017   Procedure: INSERTION PORT-A-CATH;  Surgeon: Jovita Kussmaul, MD;  Location: [facility];  Service: General;  Laterality: N/A;  . SKIN BIOPSY    . SKIN CANCER EXCISION  2010,2011   2010-RIGHT ARM, 2011-LEFT LOWER LEG.-DR Rolm Bookbinder DERM. DR. Sarajane Jews IS HER DERM SURGEON.    Family History  Problem Relation Age of Onset  . Diabetes Father   . Hypertension Father   . Heart disease Father   . Prostate cancer Father 54  . Breast cancer Sister 37       again at age 29, GT results unk  . Skin cancer Sister   . Colon cancer Paternal Grandfather 21  . Hypertension Mother   . Heart failure Mother     Social History   Socioeconomic History  . Marital status: Single    Spouse name: Not on file  . Number of children: Not on file  . Years of education: Not on file  . Highest education level: Not on file  Social Needs  . Financial resource strain: Not on file  . Food insecurity - worry: Not on file  . Food insecurity - inability: Not on file  . Transportation needs - medical: Not on file  . Transportation needs - non-medical: Not on file  Occupational History  . Not on file  Tobacco Use  . Smoking status: Never Smoker  . Smokeless tobacco: Never Used  Substance and Sexual Activity  . Alcohol use: No    Frequency: Never    Comment: rare  . Drug use: No  . Sexual  activity: No    Birth control/protection: Post-menopausal  Other Topics Concern  . Not on file  Social History Narrative  . Not on file    Past Medical History, Surgical history, Social history, and Family history were reviewed and updated as appropriate.   Please see review of systems for further details on the patient's review from today.   Review of Systems:  Review of Systems  HENT: Positive for sinus pressure.        Perioral tingling  Respiratory: Positive for chest tightness.     Objective:   Physical Exam:  LMP 03/18/1997  ECOG: 0  Physical Exam  Constitutional:  No distress.  HENT:  Head: Normocephalic and atraumatic.  Cardiovascular: Normal rate, regular rhythm and normal heart sounds. Exam reveals no gallop and no friction rub.  No murmur heard. Pulmonary/Chest: Effort normal and breath sounds normal. No respiratory distress. She has no wheezes. She has no rales.  Neurological: She is alert.  Skin: Skin is warm and dry. No rash noted. She is not diaphoretic. No erythema.    Lab Review:     Component Value Date/Time   NA 141 03/03/2017 1358   K 3.3 (L) 03/03/2017 1358   CL 101 03/01/2017 1013   CO2 28 03/03/2017 1358   GLUCOSE 107 03/03/2017 1358   BUN 12.7 03/03/2017 1358   CREATININE 0.9 03/03/2017 1358   CALCIUM 9.4 03/03/2017 1358   PROT 6.5 03/03/2017 1358   ALBUMIN 3.7 03/03/2017 1358   AST 23 03/03/2017 1358   ALT 25 03/03/2017 1358   ALKPHOS 71 03/03/2017 1358   BILITOT 0.52 03/03/2017 1358   GFRNONAA >60 03/01/2017 1013   GFRAA >60 03/01/2017 1013       Component Value Date/Time   WBC 8.7 03/03/2017 1359   WBC 6.1 03/01/2017 1013   RBC 4.43 03/03/2017 1359   RBC 4.85 03/01/2017 1013   HGB 13.0 03/03/2017 1359   HCT 39.7 03/03/2017 1359   PLT 281 03/03/2017 1359   MCV 89.6 03/03/2017 1359   MCH 29.3 03/03/2017 1359   MCH 29.3 03/01/2017 1013   MCHC 32.7 03/03/2017 1359   MCHC 33.0 03/01/2017 1013   RDW 13.2 03/03/2017 1359    LYMPHSABS 2.4 03/03/2017 1359   MONOABS 0.7 03/03/2017 1359   EOSABS 0.1 03/03/2017 1359   BASOSABS 0.0 03/03/2017 1359   -------------------------------  Imaging from last 24 hours (if applicable):  Radiology interpretation: Mr Breast Bilateral W Wo Contrast  Result Date: 02/07/2017 CLINICAL DATA:  Recent diagnosis of left breast cancer November 2018. On ultrasound-guided core biopsy, invasive mammary carcinoma was identified in the 11 o'clock and 1 o'clock locations of the left breast. Biopsy of enlarged left axillary lymph node demonstrated metastatic mammary carcinoma. Biopsy of a mass in the upper-outer quadrant of the right breast demonstrated a complex sclerosing lesion and usual duct hyperplasia. LABS:  None applicable EXAM: BILATERAL BREAST MRI WITH AND WITHOUT CONTRAST TECHNIQUE: Multiplanar, multisequence MR images of both breasts were obtained prior to and following the intravenous administration of 20 ml of MultiHance. THREE-DIMENSIONAL MR IMAGE RENDERING ON INDEPENDENT WORKSTATION: Three-dimensional MR images were rendered by post-processing of the original MR data on an independent workstation. The three-dimensional MR images were interpreted, and findings are reported in the following complete MRI report for this study. Three dimensional images were evaluated at the independent Dyna[employer] workstation COMPARISON:  Prior studies from [facility] 01/26/2017 and earlier FINDINGS: Breast composition: a. Almost entirely fat. Background parenchymal enhancement: Minimal Right breast: Within the upper-outer quadrant of the right breast, there is tissue marker artifact an enhancing biopsy tract. Surrounding the biopsy marker clip, there is a small amount of enhancement, measuring 1.0 x 1.1 cm and demonstrating medium wash-in and persistent type kinetics. This location represents the biopsy complex sclerosing lesion. Left breast: There is a large area of non mass enhancement in the upper  inner and upper outer quadrant of the left breast, extending to involve the left nipple. Area demonstrates rapid wash- in and washout type enhancement kinetics and measures 7.1 x 8.8 x 4.6 cm. Biopsy clips are identified in the upper central aspect of the breast and  the upper inner quadrant, within the area of enhancement following recent core biopsies. Lymph nodes: Enlarged lymph nodes are identified in the lower left axilla. There are 3 morphologically abnormal lymph nodes in the left level 1 station. One of these contains a tissue marker clip from recent biopsy Right axilla is unremarkable. No enlarged lymph nodes are identified in the internal mammary regions region. Ancillary findings:  None. IMPRESSION: 1. Large area of non mass enhancement in the left breast, consistent with known malignancy. Enhancement measures at least 8.8 cm and involves the left nipple. 2. Enlarged left axillary lymph nodes, consistent with known metastatic disease. 3. 1.5 non mass enhancement associated tissue marker clip artifact in the upper-outer quadrant of the right breast associated with known complex sclerosing lesion. RECOMMENDATION: Treatment plan. BI-RADS CATEGORY  6: Known biopsy-proven malignancy. Electronically Signed   By: Nolon Nations M.D.   On: 02/07/2017 14:29   Dg Chest Port 1 View  Result Date: 03/02/2017 CLINICAL DATA:  Status post Port-A-Cath placement EXAM: PORTABLE CHEST 1 VIEW COMPARISON:  None. FINDINGS: There is a right-sided Port-A-Cath with the tip projecting over the SVC. There is no focal parenchymal opacity. There is no pleural effusion or pneumothorax. The heart and mediastinal contours are unremarkable. The osseous structures are unremarkable. IMPRESSION: No active disease. Electronically Signed   By: Kathreen Devoid   On: 03/02/2017 12:23   Dg Fluoro Guide Cv Line-no Report  Result Date: 03/02/2017 Fluoroscopy was utilized by the requesting physician.  No radiographic interpretation.

## 2017-03-07 NOTE — Progress Notes (Signed)
[place]  [phone] Fax:([phone]) [phone]     ID: RENAI LOPATA DOB: 1950/08/22  MR#: [id number]  XLK#:[id number]  Patient Care Team: [name], DO as PCP - General (Family Medicine) Phineas Real, Belinda Block, MD (Obstetrics and Gynecology) Rolm Bookbinder, MD as Attending Physician (Dermatology) Arvie Bartholomew, Virgie Dad, MD as Consulting Physician (Oncology) Kyung Rudd, MD as Consulting Physician (Radiation Oncology) Jovita Kussmaul, MD as Consulting Physician (General Surgery) OTHER MD:  CHIEF COMPLAINT: Estrogen receptor positive breast cancer  CURRENT TREATMENT: Neoadjuvant chemotherapy   HISTORY OF CURRENT ILLNESS: From the original intake note:  Ardella herself palpated a change in her left breast and brought this to medical attention.  On 01/18/2017 she underwent bilateral diagnostic mammography with tomography and bilateral breast ultrasonography at [facility].  The breast density was category B.  At the most recent exam was from December 2016.  In the right breast upper outer quadrant there was a 1 cm irregular mass associated with punctate calcification.  By ultrasound this was not well-defined, and it was biopsied on 01/26/2017 with tomographic guidance this showed a complex sclerosing lesion with the usual ductal hyperplasia.  The right axilla was sonographically  On the left mammography showed a 4 cm dense mass with indistinct margins in the upper inner quadrant.  Ultrasound defined a 3 cm irregular mass in the upper inner quadrant of the left breast which was palpable.  There was an abnormal lymph node in the left axilla.  Biopsy of the left mass and abnormal lymph node ([id number], on 01/26/2017) showed both to be involved by invasive ductal carcinoma, with some lobular features but E-cadherin positive, estrogen and progesterone receptors both 100% positive, with strong staining intensity, with MIB-1 between 15-20%.  There was no HER-2 amplification, the  signals ratio being 1.31/1.58 and the number per cell 2.30/2.05  The patient's subsequent history is as detailed below.  INTERVAL HISTORY: Eleny returns today for follow up and treatment of her estrogen receptor positive breast cancer.  She is currently day 7 cycle 1 of 4 planned cycles of cyclophosphamide and docetaxel given 21 days apart.  She is not tolerating this treatment well. She notes severe pain and digestive issues that are severely affecting her. She notes that she cannot take having another treatment like this, and it is too much for her to handle.     REVIEW OF SYSTEMS: Idella reports that she is in severe pain in her lower back, through her joints, legs, and throughout her body. She notes that she has had this pain since 03/05/2017. She notes that she has been drinking water and she is afraid to eat because her insides are "like leather" and are raw. She notes having pain in her colon and intestines that feel like they are going to explode. She denies diarrhea and vomiting. She reports that she hasn't eaten anything because it seems like her digestive system is not working. She notes horrible constipation, which she has been trying to aid with water. She notes that eating makes her gurgle. She notes that her mouth and tongue are sore. She denies open lesions, due to being careful. She notes that her temperature was slightly higher than she is used to, but she denies any obvious fever that she knows of. She denies unusual headaches, visual changes, nausea, vomiting, or dizziness. There has been no unusual cough, phlegm production, or pleurisy. She denies unexplained fatigue or unexplained weight loss, bleeding, rash, or fever. A detailed review of systems  was otherwise stable.     PAST MEDICAL HISTORY: Past Medical History:  Diagnosis Date  . Anemia   . Anxiety   . Arthritis   . Bilateral dry eyes   . Cancer ([place])   . Family history of breast cancer 02/02/2017  . Family history of  colon cancer 02/02/2017  . Family history of prostate cancer in father 02/02/2017  . Fibroid   . History of kidney stones   . Hyperlipidemia   . Kidney stones   . Melanoma of upper arm ([place]) 2009   RIGHT ARM ; left breast cancer, squamous cell carcinoma of the skin  . Obesity (BMI 30.0-34.9)   . Osteopenia 05/2015   T score -1.3 FRAX 6%/0.5% stable from prior DEXA  . PONV (postoperative nausea and vomiting)   . UTI (urinary tract infection)   . Varicose veins of both lower extremities   . Vitamin D deficiency 07/2009   LOW VITAMIN D 29  . Vitamin deficiency 07/2008   VITAMIN D LOW 23    PAST SURGICAL HISTORY: Past Surgical History:  Procedure Laterality Date  . ABDOMINAL SURGERY  1975   ovairan cyst   . APPENDECTOMY    . BREAST SURGERY     bio  . CHOLECYSTECTOMY    . DERMOID CYST  EXCISION    . DILATION AND CURETTAGE OF UTERUS  1998  . GALLBLADDER SURGERY    . HYSTEROSCOPY    . insertion  port a cath  03/02/2016  . PORTACATH PLACEMENT N/A 03/02/2017   Procedure: INSERTION PORT-A-CATH;  Surgeon: Jovita Kussmaul, MD;  Location: [facility];  Service: General;  Laterality: N/A;  . SKIN BIOPSY    . SKIN CANCER EXCISION  2010,2011   2010-RIGHT ARM, 2011-LEFT LOWER LEG.-DR Rolm Bookbinder DERM. DR. Sarajane Jews IS HER DERM SURGEON.    FAMILY HISTORY Family History  Problem Relation Age of Onset  . Diabetes Father   . Hypertension Father   . Heart disease Father   . Prostate cancer Father 45  . Breast cancer Sister 78       again at age 55, GT results unk  . Skin cancer Sister   . Colon cancer Paternal Grandfather 41  . Hypertension Mother   . Heart failure Mother   The patient's father was diagnosed with prostate cancer in his 35s and died at age 76 from complications of that disease.  The patient's mother died at age 22 with congestive heart failure.  The patient had no brothers, 3 sisters.  One sister had breast cancer at age 46 and again at age 44.  She has been genetically tested but  surprisingly the sister does not know her genetics testing results.  In addition a paternal grandfather had colon cancer in his 19s.  There is no history of ovarian cancer in the family to the patient's knowledge.  GYNECOLOGIC HISTORY:  Patient's last menstrual period was 03/18/1997. Menarche age 36. The patient is GX P0.  She stopped having periods in 1999 and took hormone replacement approximately 5 years.  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 She is single and lives alone with no pets.   ADVANCED DIRECTIVES:    HEALTH MAINTENANCE: Social History   Tobacco Use  . Smoking status: Never Smoker  . Smokeless tobacco: Never Used  Substance Use Topics  . Alcohol use: No    Frequency: Never  Comment: rare  . Drug use: No     Colonoscopy: Never  PAP:  Bone density: 2017/osteopenia   Allergies  Allergen Reactions  . Neosporin [Neomycin-Bacitracin Zn-Polymyx] Swelling, Rash and Other (See Comments)    Blisters  . Benzalkonium Chloride Other (See Comments)    Redness  . Latex Rash    Current Outpatient Medications  Medication Sig Dispense Refill  . acetaminophen (TYLENOL) 500 MG tablet Take 500 mg by mouth every 6 (six) hours as needed for moderate pain or headache.    . alclomethasone (ACLOVATE) 0.05 % cream Apply to affected area once daily as needed for dermatitis  0  . dexamethasone (DECADRON) 4 MG tablet Take 2 tablets (8 mg total) by mouth 2 (two) times daily. Start the day before Taxotere. Then again the day after chemo for 3 days. 30 tablet 1  . HYDROcodone-acetaminophen (NORCO/VICODIN) 5-325 MG tablet Take 1-2 tablets by mouth every 6 (six) hours as needed for moderate pain or severe pain. 10 tablet 0  . HYDROcodone-acetaminophen (NORCO/VICODIN) 5-325 MG tablet Take 1-2 tablets by mouth every 6 (six) hours as needed for moderate pain or severe pain. 10  tablet 0  . Hyprom-Naphaz-Polysorb-Zn Sulf (CLEAR EYES COMPLETE OP) Place 1-2 drops into both eyes daily as needed (for dry eyes).    Marland Kitchen ibuprofen (ADVIL,MOTRIN) 200 MG tablet Take 200 mg by mouth every 6 (six) hours as needed for headache or moderate pain.    Marland Kitchen lidocaine-prilocaine (EMLA) cream Apply to affected area once 30 g 3  . LORazepam (ATIVAN) 0.5 MG tablet Take 1 tablet (0.5 mg total) by mouth at bedtime as needed (Nausea or vomiting). 30 tablet 0  . ondansetron (ZOFRAN) 8 MG tablet Take 1 tablet (8 mg total) by mouth 2 (two) times daily as needed for refractory nausea / vomiting. Start on day 3 after chemo. 30 tablet 1  . prochlorperazine (COMPAZINE) 10 MG tablet Take 1 tablet (10 mg total) by mouth every 6 (six) hours as needed (Nausea or vomiting). 30 tablet 1  . XIIDRA 5 % SOLN Place 1 drop into both eyes daily.   1   No current facility-administered medications for this visit.     OBJECTIVE: Middle-aged white woman who was very distressed during today's visit  Vitals:   03/09/17 1349  BP: 123/72  Pulse: 99  Resp: 18  Temp: 98.9 F (37.2 C)  SpO2: 98%     Body mass index is 31.99 kg/m.   Wt Readings from Last 3 Encounters:  03/09/17 216 lb 9.6 oz (98.2 kg)  03/02/17 216 lb (98 kg)  03/01/17 216 lb 12.8 oz (98.3 kg)      ECOG FS:2 - Symptomatic, <50% confined to bed  Sclerae unicteric, EOMs intact Oropharynx clear and moist No cervical or supraclavicular adenopathy Lungs no rales or rhonchi Heart regular rate and rhythm Abd soft, nontender, positive bowel sounds MSK no focal spinal tenderness, no upper extremity lymphedema Neuro: nonfocal, well oriented, appropriate affect Breasts: Deferred   LAB RESULTS:  CMP     Component Value Date/Time   NA 141 03/03/2017 1358   K 3.3 (L) 03/03/2017 1358   CL 101 03/01/2017 1013   CO2 28 03/03/2017 1358   GLUCOSE 107 03/03/2017 1358   BUN 12.7 03/03/2017 1358   CREATININE 0.9 03/03/2017 1358   CALCIUM 9.4  03/03/2017 1358   PROT 6.5 03/03/2017 1358   ALBUMIN 3.7 03/03/2017 1358   AST 23 03/03/2017 1358   ALT 25 03/03/2017 1358  ALKPHOS 71 03/03/2017 1358   BILITOT 0.52 03/03/2017 1358   GFRNONAA >60 03/01/2017 1013   GFRAA >60 03/01/2017 1013    No results found for: TOTALPROTELP, ALBUMINELP, A1GS, A2GS, BETS, BETA2SER, GAMS, MSPIKE, SPEI  No results found for: Nils Pyle, [facility]  Lab Results  Component Value Date   WBC 1.4 (L) 03/09/2017   NEUTROABS 0.2 (LL) 03/09/2017   HGB 13.9 03/09/2017   HCT 41.9 03/09/2017   MCV 88.2 03/09/2017   PLT 169 03/09/2017      Chemistry      Component Value Date/Time   NA 141 03/03/2017 1358   K 3.3 (L) 03/03/2017 1358   CL 101 03/01/2017 1013   CO2 28 03/03/2017 1358   BUN 12.7 03/03/2017 1358   CREATININE 0.9 03/03/2017 1358      Component Value Date/Time   CALCIUM 9.4 03/03/2017 1358   ALKPHOS 71 03/03/2017 1358   AST 23 03/03/2017 1358   ALT 25 03/03/2017 1358   BILITOT 0.52 03/03/2017 1358       No results found for: LABCA2  No components found for: [id number]  No results for input(s): INR in the last 168 hours.  No results found for: LABCA2  No results found for: WUJ811  No results found for: BJY782  No results found for: NFA213  No results found for: CA2729  No components found for: HGQUANT  No results found for: CEA1 / No results found for: CEA1   No results found for: AFPTUMOR  No results found for: [place]  No results found for: PSA1  Appointment on 03/09/2017  Component Date Value Ref Range Status  . WBC 03/09/2017 1.4* 3.9 - 10.3 K/uL Final  . RBC 03/09/2017 4.75  3.70 - 5.45 MIL/uL Final  . Hemoglobin 03/09/2017 13.9  11.6 - 15.9 g/dL Final  . HCT 03/09/2017 41.9  34.8 - 46.6 % Final  . MCV 03/09/2017 88.2  79.5 - 101.0 fL Final  . MCH 03/09/2017 29.3  25.1 - 34.0 pg Final  . MCHC 03/09/2017 33.2  31.5 - 36.0 g/dL Final  . RDW 03/09/2017 12.9  11.2 - 16.1 % Final  .  Platelets 03/09/2017 169  145 - 400 K/uL Final  . Neutrophils Relative % 03/09/2017 16  % Final  . Neutro Abs 03/09/2017 0.2* 1.5 - 6.5 K/uL Final   Successful Call: ANEU called 03/09/2017 01:48 PM to Van ([phone]/CAUSEY,LINDSEY CORNETTO) by [id number]. Read Back: Yes Comments: CRITICAL RESULT CALLED TO, READ BACK BY AND VERIFIED WITH: LORI   . Lymphocytes Relative 03/09/2017 62  % Final  . Lymphs Abs 03/09/2017 0.9  0.9 - 3.3 K/uL Final  . Monocytes Relative 03/09/2017 20  % Final  . Monocytes Absolute 03/09/2017 0.3  0.1 - 0.9 K/uL Final  . Eosinophils Relative 03/09/2017 1  % Final  . Eosinophils Absolute 03/09/2017 0.0  0.0 - 0.5 K/uL Final  . Basophils Relative 03/09/2017 1  % Final  . Basophils Absolute 03/09/2017 0.0  0.0 - 0.1 K/uL Final   Performed at [facility], [place] [address], [place], [place] [address]    (this displays the last labs from the last 3 days)  No results found for: TOTALPROTELP, ALBUMINELP, A1GS, A2GS, BETS, BETA2SER, GAMS, MSPIKE, SPEI (this displays SPEP labs)  No results found for: KPAFRELGTCHN, LAMBDASER, KAPLAMBRATIO (kappa/lambda light chains)  No results found for: HGBA, HGBA2QUANT, HGBFQUANT, HGBSQUAN (Hemoglobinopathy evaluation)   No results found for: LDH  No results found for: IRON, TIBC, IRONPCTSAT (  Iron and TIBC)  No results found for: FERRITIN  Urinalysis    Component Value Date/Time   COLORURINE YELLOW 05/29/2015 [place] 05/29/2015 1113   LABSPEC 1.023 05/29/2015 1113   PHURINE 5.5 05/29/2015 1113   GLUCOSEU NEGATIVE 05/29/2015 1113   GLUCOSEU NEGATIVE 11/05/2012 1559   HGBUR NEGATIVE 05/29/2015 1113   BILIRUBINUR negative 02/10/2017 1622   KETONESUR NEGATIVE 05/29/2015 1113   PROTEINUR negative 02/10/2017 1622   PROTEINUR NEGATIVE 05/29/2015 1113   UROBILINOGEN 0.2 02/10/2017 1622   UROBILINOGEN 0.2 11/05/2012 1559   NITRITE negative 02/10/2017 1622   NITRITE  NEGATIVE 05/29/2015 1113   LEUKOCYTESUR Negative 02/10/2017 1622     STUDIES: Dg Chest Port 1 View  Result Date: 03/02/2017 CLINICAL DATA:  Status post Port-A-Cath placement EXAM: PORTABLE CHEST 1 VIEW COMPARISON:  None. FINDINGS: There is a right-sided Port-A-Cath with the tip projecting over the SVC. There is no focal parenchymal opacity. There is no pleural effusion or pneumothorax. The heart and mediastinal contours are unremarkable. The osseous structures are unremarkable. IMPRESSION: No active disease. Electronically Signed   By: Kathreen Devoid   On: 03/02/2017 12:23   Dg Fluoro Guide Cv Line-no Report  Result Date: 03/02/2017 Fluoroscopy was utilized by the requesting physician.  No radiographic interpretation.    ELIGIBLE FOR AVAILABLE RESEARCH PROTOCOL: No  ASSESSMENT: 67 y.o. [place] woman status post bilateral biopsies 01/26/2017, showing  (1) in the right breast, a complex sclerosing lesion  (2) in the left breast, a cT2 pN1 invasive ductal carcinoma (with some lobular features but E-cadherin positive), grade 2, estrogen and progesterone receptor positive, HER-2 not amplified, with an MIB-1 of 15-20%  (a) breast MRI 02/04/2017 suggests a T3 N1 tumor  (3) started neoadjuvant cyclophosphamide/docetaxel 03/03/2017, to be repeated every 21 days x4  (4) definitive surgery to follow  (5) adjuvant radiation to follow surgery  ( 6) to start antiestrogens at the completion of local treatment  PLAN: Rockelle has had a horrible experience with her first cyclophosphamide/docetaxel dose.  She has been in terrible pain for several days.  She did not call us to report this, and she did not take any pain medicine, but in any case we are not going to be able to repeat this particular chemotherapy and we will need to find alternatives for her.  The problem of course is not so much the chemotherapy as the Neulasta.  What is happening is that her bone marrow is trying to expand and of course  the bones do not give.  This causes pain and it can be severe enough that patient sometimes report they think their bones are breaking.  She is very reluctant to take pain medicine, but we discussed specifically taking naproxen 200 mg together with Tylenol 500 mg.  She can do this up to 3 times a day with food.  She can also increase it to 400 mg on the Naprosyn together with 500 mg on the Tylenol.  This is generally effective.  She did have a prescription for Norco but she never filled it.  If this combination does not work for her she will let me know.  Otherwise I do expect the pain to subside over the next 2-3 days which is the usual course  She is also severely neutropenic.  I am starting her on Cipro twice daily for 5 days beginning today.  She will return to see me on Monday.  At that time I expect her counts will have recovered  and she will be feeling considerably better.  At that time we can decide what to do.  I think the best option for her may be CMF which would not require Neulasta.  We could try cyclophosphamide and doxorubicin without Neulasta, and that could be given every 3 weeks.  It would then be followed by paclitaxel for a total of 9 weeks (counting the Taxotere she received as equal to 3 doses of paclitaxel).  Either would be completed and 18-21 weeks.  Otherwise I have encouraged her to call us with any problems and certainly if the pain does not get better on the current protocol.   Jaiceon Collister, Virgie Dad, MD  03/09/17 2:18 PM Medical Oncology and Hematology [facility] [address] [place], [place] [address] Tel. [phone]    Fax. [phone]  This document serves as a record of services personally performed by Lurline Del, MD. It was created on his behalf by Sheron Nightingale, a trained medical scribe. The creation of this record is based on the scribe's personal observations and the provider's statements to them.   I have reviewed the above  documentation for accuracy and completeness, and I agree with the above.

## 2017-03-09 ENCOUNTER — Inpatient Hospital Stay: Payer: Medicare Other | Attending: Oncology | Admitting: Oncology

## 2017-03-09 ENCOUNTER — Telehealth: Payer: Self-pay | Admitting: Oncology

## 2017-03-09 ENCOUNTER — Inpatient Hospital Stay: Payer: Medicare Other

## 2017-03-09 ENCOUNTER — Encounter: Payer: Self-pay | Admitting: *Deleted

## 2017-03-09 DIAGNOSIS — Z8042 Family history of malignant neoplasm of prostate: Secondary | ICD-10-CM | POA: Insufficient documentation

## 2017-03-09 DIAGNOSIS — D702 Other drug-induced agranulocytosis: Secondary | ICD-10-CM | POA: Insufficient documentation

## 2017-03-09 DIAGNOSIS — Z85820 Personal history of malignant melanoma of skin: Secondary | ICD-10-CM | POA: Insufficient documentation

## 2017-03-09 DIAGNOSIS — N6489 Other specified disorders of breast: Secondary | ICD-10-CM | POA: Diagnosis not present

## 2017-03-09 DIAGNOSIS — Z800 Family history of malignant neoplasm of digestive organs: Secondary | ICD-10-CM | POA: Insufficient documentation

## 2017-03-09 DIAGNOSIS — Z170 Estrogen receptor positive status [ER+]: Secondary | ICD-10-CM | POA: Diagnosis not present

## 2017-03-09 DIAGNOSIS — M1990 Unspecified osteoarthritis, unspecified site: Secondary | ICD-10-CM | POA: Insufficient documentation

## 2017-03-09 DIAGNOSIS — T451X5S Adverse effect of antineoplastic and immunosuppressive drugs, sequela: Secondary | ICD-10-CM | POA: Diagnosis not present

## 2017-03-09 DIAGNOSIS — R05 Cough: Secondary | ICD-10-CM | POA: Diagnosis not present

## 2017-03-09 DIAGNOSIS — Z803 Family history of malignant neoplasm of breast: Secondary | ICD-10-CM | POA: Diagnosis not present

## 2017-03-09 DIAGNOSIS — C50812 Malignant neoplasm of overlapping sites of left female breast: Secondary | ICD-10-CM

## 2017-03-09 DIAGNOSIS — D701 Agranulocytosis secondary to cancer chemotherapy: Secondary | ICD-10-CM

## 2017-03-09 DIAGNOSIS — C50212 Malignant neoplasm of upper-inner quadrant of left female breast: Secondary | ICD-10-CM | POA: Insufficient documentation

## 2017-03-09 DIAGNOSIS — Z5111 Encounter for antineoplastic chemotherapy: Secondary | ICD-10-CM | POA: Diagnosis not present

## 2017-03-09 DIAGNOSIS — Z95828 Presence of other vascular implants and grafts: Secondary | ICD-10-CM

## 2017-03-09 DIAGNOSIS — E785 Hyperlipidemia, unspecified: Secondary | ICD-10-CM | POA: Diagnosis not present

## 2017-03-09 DIAGNOSIS — J3489 Other specified disorders of nose and nasal sinuses: Secondary | ICD-10-CM | POA: Insufficient documentation

## 2017-03-09 DIAGNOSIS — Z79899 Other long term (current) drug therapy: Secondary | ICD-10-CM | POA: Diagnosis not present

## 2017-03-09 DIAGNOSIS — G893 Neoplasm related pain (acute) (chronic): Secondary | ICD-10-CM

## 2017-03-09 LAB — COMPREHENSIVE METABOLIC PANEL
ALK PHOS: 89 U/L (ref 40–150)
ALT: 37 U/L (ref 0–55)
ANION GAP: 10 (ref 3–11)
AST: 13 U/L (ref 5–34)
Albumin: 3.6 g/dL (ref 3.5–5.0)
BILIRUBIN TOTAL: 1.2 mg/dL (ref 0.2–1.2)
BUN: 11 mg/dL (ref 7–26)
CALCIUM: 9.4 mg/dL (ref 8.4–10.4)
CO2: 27 mmol/L (ref 22–29)
CREATININE: 0.82 mg/dL (ref 0.60–1.10)
Chloride: 99 mmol/L (ref 98–109)
GFR calc Af Amer: >60 mL/min (ref >60)
Glucose, Bld: 96 mg/dL (ref 70–140)
Potassium: 4.1 mmol/L (ref 3.3–4.7)
Sodium: 136 mmol/L (ref 136–145)
TOTAL PROTEIN: 6.7 g/dL (ref 6.4–8.3)

## 2017-03-09 LAB — CBC WITH DIFFERENTIAL/PLATELET
BASOS ABS: 0 10*3/uL (ref 0.0–0.1)
BASOS PCT: 1 %
Eosinophils Absolute: 0 10*3/uL (ref 0.0–0.5)
Eosinophils Relative: 1 %
HEMATOCRIT: 41.9 % (ref 34.8–46.6)
HEMOGLOBIN: 13.9 g/dL (ref 11.6–15.9)
Lymphocytes Relative: 62 %
Lymphs Abs: 0.9 10*3/uL (ref 0.9–3.3)
MCH: 29.3 pg (ref 25.1–34.0)
MCHC: 33.2 g/dL (ref 31.5–36.0)
MCV: 88.2 fL (ref 79.5–101.0)
MONO ABS: 0.3 10*3/uL (ref 0.1–0.9)
Monocytes Relative: 20 %
NEUTROS ABS: 0.2 10*3/uL — AB (ref 1.5–6.5)
NEUTROS PCT: 16 %
Platelets: 169 10*3/uL (ref 145–400)
RBC: 4.75 MIL/uL (ref 3.70–5.45)
RDW: 12.9 % (ref 11.2–16.1)
WBC: 1.4 10*3/uL — ABNORMAL LOW (ref 3.9–10.3)

## 2017-03-09 MED ORDER — SODIUM CHLORIDE 0.9% FLUSH
10.0000 mL | INTRAVENOUS | Status: DC | PRN
  Administered 2017-03-09: 10 mL via INTRAVENOUS
  Filled 2017-03-09: qty 10

## 2017-03-09 MED ORDER — HEPARIN SOD (PORK) LOCK FLUSH 100 UNIT/ML IV SOLN
500.0000 [IU] | Freq: Once | INTRAVENOUS | Status: AC
  Administered 2017-03-09: 500 [IU] via INTRAVENOUS
  Filled 2017-03-09: qty 5

## 2017-03-09 MED ORDER — CIPROFLOXACIN HCL 500 MG PO TABS: 500.0000 mg | ORAL_TABLET | Freq: Two times a day (BID) | ORAL | 1 refills | Status: DC

## 2017-03-09 NOTE — Patient Instructions (Signed)
Implanted Port Home Guide An implanted port is a type of central line that is placed under the skin. Central lines are used to provide IV access when treatment or nutrition needs to be given through a person's veins. Implanted ports are used for long-term IV access. An implanted port may be placed because:  You need IV medicine that would be irritating to the small veins in your hands or arms.  You need long-term IV medicines, such as antibiotics.  You need IV nutrition for a long period.  You need frequent blood draws for lab tests.  You need dialysis.  Implanted ports are usually placed in the chest area, but they can also be placed in the upper arm, the abdomen, or the leg. An implanted port has two main parts:  Reservoir. The reservoir is round and will appear as a small, raised area under your skin. The reservoir is the part where a needle is inserted to give medicines or draw blood.  Catheter. The catheter is a thin, flexible tube that extends from the reservoir. The catheter is placed into a large vein. Medicine that is inserted into the reservoir goes into the catheter and then into the vein.  How will I care for my incision site? Do not get the incision site wet. Bathe or shower as directed by your health care provider. How is my port accessed? Special steps must be taken to access the port:  Before the port is accessed, a numbing cream can be placed on the skin. This helps numb the skin over the port site.  Your health care provider uses a sterile technique to access the port. ? Your health care provider must put on a mask and sterile gloves. ? The skin over your port is cleaned carefully with an antiseptic and allowed to dry. ? The port is gently pinched between sterile gloves, and a needle is inserted into the port.  Only "non-coring" port needles should be used to access the port. Once the port is accessed, a blood return should be checked. This helps ensure that the port  is in the vein and is not clogged.  If your port needs to remain accessed for a constant infusion, a clear (transparent) bandage will be placed over the needle site. The bandage and needle will need to be changed every week, or as directed by your health care provider.  Keep the bandage covering the needle clean and dry. Do not get it wet. Follow your health care provider's instructions on how to take a shower or bath while the port is accessed.  If your port does not need to stay accessed, no bandage is needed over the port.  What is flushing? Flushing helps keep the port from getting clogged. Follow your health care provider's instructions on how and when to flush the port. Ports are usually flushed with saline solution or a medicine called heparin. The need for flushing will depend on how the port is used.  If the port is used for intermittent medicines or blood draws, the port will need to be flushed: ? After medicines have been given. ? After blood has been drawn. ? As part of routine maintenance.  If a constant infusion is running, the port may not need to be flushed.  How long will my port stay implanted? The port can stay in for as long as your health care provider thinks it is needed. When it is time for the port to come out, surgery will be   done to remove it. The procedure is similar to the one performed when the port was put in. When should I seek immediate medical care? When you have an implanted port, you should seek immediate medical care if:  You notice a bad smell coming from the incision site.  You have swelling, redness, or drainage at the incision site.  You have more swelling or pain at the port site or the surrounding area.  You have a fever that is not controlled with medicine.  This information is not intended to replace advice given to you by your health care provider. Make sure you discuss any questions you have with your health care provider. Document  Released: 02/14/2005 Document Revised: 07/23/2015 Document Reviewed: 10/22/2012 Elsevier Interactive Patient Education  2017 [employer]vier Inc.  

## 2017-03-09 NOTE — Telephone Encounter (Signed)
Gave patient avs and calendar with appts per 1/10 los °

## 2017-03-10 ENCOUNTER — Ambulatory Visit: Payer: BC Managed Care – PPO | Admitting: Adult Health

## 2017-03-10 ENCOUNTER — Telehealth (HOSPITAL_COMMUNITY): Payer: Self-pay | Admitting: Vascular Surgery

## 2017-03-10 ENCOUNTER — Other Ambulatory Visit: Payer: BC Managed Care – PPO

## 2017-03-10 NOTE — Telephone Encounter (Signed)
Called pt 3 times no call back, made pt appt w/ echo will send in mail

## 2017-03-13 ENCOUNTER — Inpatient Hospital Stay (HOSPITAL_BASED_OUTPATIENT_CLINIC_OR_DEPARTMENT_OTHER): Payer: Medicare Other | Admitting: Oncology

## 2017-03-13 ENCOUNTER — Telehealth: Payer: Self-pay | Admitting: Oncology

## 2017-03-13 ENCOUNTER — Inpatient Hospital Stay: Payer: Medicare Other

## 2017-03-13 VITALS — BP 137/78 | HR 76 | Temp 97.5°F | Resp 18 | Ht 69.0 in | Wt 219.0 lb

## 2017-03-13 DIAGNOSIS — Z5111 Encounter for antineoplastic chemotherapy: Secondary | ICD-10-CM | POA: Diagnosis not present

## 2017-03-13 DIAGNOSIS — N6489 Other specified disorders of breast: Secondary | ICD-10-CM | POA: Diagnosis not present

## 2017-03-13 DIAGNOSIS — C50812 Malignant neoplasm of overlapping sites of left female breast: Secondary | ICD-10-CM

## 2017-03-13 DIAGNOSIS — Z170 Estrogen receptor positive status [ER+]: Secondary | ICD-10-CM | POA: Diagnosis not present

## 2017-03-13 DIAGNOSIS — C50212 Malignant neoplasm of upper-inner quadrant of left female breast: Secondary | ICD-10-CM | POA: Diagnosis not present

## 2017-03-13 DIAGNOSIS — D702 Other drug-induced agranulocytosis: Secondary | ICD-10-CM

## 2017-03-13 DIAGNOSIS — T451X5S Adverse effect of antineoplastic and immunosuppressive drugs, sequela: Secondary | ICD-10-CM | POA: Diagnosis not present

## 2017-03-13 DIAGNOSIS — D701 Agranulocytosis secondary to cancer chemotherapy: Secondary | ICD-10-CM | POA: Diagnosis not present

## 2017-03-13 LAB — COMPREHENSIVE METABOLIC PANEL
ALT: 25 U/L (ref 0–55)
ANION GAP: 10 (ref 3–11)
AST: 14 U/L (ref 5–34)
Albumin: 3.7 g/dL (ref 3.5–5.0)
Alkaline Phosphatase: 103 U/L (ref 40–150)
BUN: 6 mg/dL — ABNORMAL LOW (ref 7–26)
CHLORIDE: 104 mmol/L (ref 98–109)
CO2: 26 mmol/L (ref 22–29)
Calcium: 9.1 mg/dL (ref 8.4–10.4)
Creatinine, Ser: 0.86 mg/dL (ref 0.60–1.10)
Glucose, Bld: 95 mg/dL (ref 70–140)
POTASSIUM: 3.9 mmol/L (ref 3.3–4.7)
SODIUM: 140 mmol/L (ref 136–145)
Total Bilirubin: 0.4 mg/dL (ref 0.2–1.2)
Total Protein: 6.7 g/dL (ref 6.4–8.3)

## 2017-03-13 LAB — CBC WITH DIFFERENTIAL/PLATELET
Basophils Absolute: 0.1 10*3/uL (ref 0.0–0.1)
Basophils Relative: 1 %
Eosinophils Absolute: 0 10*3/uL (ref 0.0–0.5)
Eosinophils Relative: 0 %
HEMATOCRIT: 42.7 % (ref 34.8–46.6)
HEMOGLOBIN: 13.7 g/dL (ref 11.6–15.9)
LYMPHS ABS: 2.2 10*3/uL (ref 0.9–3.3)
Lymphocytes Relative: 10 %
MCH: 29.1 pg (ref 25.1–34.0)
MCHC: 32.1 g/dL (ref 31.5–36.0)
MCV: 90.7 fL (ref 79.5–101.0)
Monocytes Absolute: 1.6 10*3/uL — ABNORMAL HIGH (ref 0.1–0.9)
Monocytes Relative: 7 %
NEUTROS ABS: 18.6 10*3/uL — AB (ref 1.5–6.5)
NEUTROS PCT: 82 %
Platelets: 220 10*3/uL (ref 145–400)
RBC: 4.71 MIL/uL (ref 3.70–5.45)
RDW: 13.2 % (ref 11.2–16.1)
WBC: 22.5 10*3/uL — AB (ref 3.9–10.3)

## 2017-03-13 NOTE — Progress Notes (Signed)
Brooke [name]  [phone] Fax:([phone]) [phone]     ID: Brooke [name] DOB: 11-Apr-1950  MR#: [id number]  PPJ#:[id number]  Patient Care Team: [name], DO as PCP - General (Family Medicine) Phineas Real, Belinda Block, MD (Obstetrics and Gynecology) Rolm Bookbinder, MD as Attending Physician (Dermatology) , Virgie Dad, MD as Consulting Physician (Oncology) Kyung Rudd, MD as Consulting Physician (Radiation Oncology) Jovita Kussmaul, MD as Consulting Physician (General Surgery) OTHER MD:  CHIEF COMPLAINT: Estrogen receptor positive breast cancer  CURRENT TREATMENT: Neoadjuvant chemotherapy   HISTORY OF CURRENT ILLNESS: From the original intake note:  Brooke [name] herself palpated a change in her left breast and brought this to medical attention.  On 01/18/2017 she underwent bilateral diagnostic mammography with tomography and bilateral breast ultrasonography at [facility].  The breast density was category B.  At the most recent exam was from December 2016.  In the right breast upper outer quadrant there was a 1 cm irregular mass associated with punctate calcification.  By ultrasound this was not well-defined, and it was biopsied on 01/26/2017 with tomographic guidance this showed a complex sclerosing lesion with the usual ductal hyperplasia.  The right axilla was sonographically  On the left mammography showed a 4 cm dense mass with indistinct margins in the upper inner quadrant.  Ultrasound defined a 3 cm irregular mass in the upper inner quadrant of the left breast which was palpable.  There was an abnormal lymph node in the left axilla.  Biopsy of the left mass and abnormal lymph node ([id number], on 01/26/2017) showed both to be involved by invasive ductal carcinoma, with some lobular features but E-cadherin positive, estrogen and progesterone receptors both 100% positive, with strong staining intensity, with MIB-1 between 15-20%.  There was no HER-2 amplification, the  signals ratio being 1.31/1.58 and the number per cell 2.30/2.05  The patient's subsequent history is as detailed below.  INTERVAL HISTOR Brooke [name] returns today for follow up and treatment of her estrogen receptor positive breast cancer.  She received 1 cycle of cyclophosphamide/docetaxel on 03/03/2017, which she tolerated very poorly.  Today is day 11 cycle 1.  However we are going to be changing her chemotherapy and that is why she is here.    She reports that since her last visit, her bone pains have subsided, and she is starting to feel better.  She received a lovely card of support from Dr. Ubaldo Glassing which she shared with me.     REVIEW OF SYSTEMS: Brooke [name] reports that her pain is better since we saw her last week. She reports that she has bumps around her groin and buttock area. She notes that she discovered the bumps after she showered one night. She notes that they are tender and feels raw. She denies having discharge from the area or fever. She notes that last week she started having an earache in her right ear. She reports that she tried to take it easy and catch up on sleep. She notes that by Saturday night, her stomach pain was starting to feel better. She denies unusual headaches, visual changes, nausea, vomiting, or dizziness. There has been no unusual cough, phlegm production, or pleurisy. This been no change in bowel or bladder habits. She denies unexplained fatigue or unexplained weight loss, bleeding, rash, or fever. A detailed review of systems was otherwise stable.     PAST MEDICAL HISTORY: Past Medical History:  Diagnosis Date  . Anemia   . Anxiety   . Arthritis   .  Bilateral dry eyes   . Cancer ([place])   . Family history of breast cancer 02/02/2017  . Family history of colon cancer 02/02/2017  . Family history of prostate cancer in father 02/02/2017  . Fibroid   . History of kidney stones   . Hyperlipidemia   . Kidney stones   . Melanoma of upper arm ([place]) 2009   RIGHT ARM ; left  breast cancer, squamous cell carcinoma of the skin  . Obesity (BMI 30.0-34.9)   . Osteopenia 05/2015   T score -1.3 FRAX 6%/0.5% stable from prior DEXA  . PONV (postoperative nausea and vomiting)   . UTI (urinary tract infection)   . Varicose veins of both lower extremities   . Vitamin D deficiency 07/2009   LOW VITAMIN D 29  . Vitamin deficiency 07/2008   VITAMIN D LOW 23    PAST SURGICAL HISTORY: Past Surgical History:  Procedure Laterality Date  . ABDOMINAL SURGERY  1975   ovairan cyst   . APPENDECTOMY    . BREAST SURGERY     bio  . CHOLECYSTECTOMY    . DERMOID CYST  EXCISION    . DILATION AND CURETTAGE OF UTERUS  1998  . GALLBLADDER SURGERY    . HYSTEROSCOPY    . insertion  port a cath  03/02/2016  . PORTACATH PLACEMENT N/A 03/02/2017   Procedure: INSERTION PORT-A-CATH;  Surgeon: Jovita Kussmaul, MD;  Location: [facility];  Service: General;  Laterality: N/A;  . SKIN BIOPSY    . SKIN CANCER EXCISION  2010,2011   2010-RIGHT ARM, 2011-LEFT LOWER LEG.-DR Rolm Bookbinder DERM. DR. Sarajane Jews IS HER DERM SURGEON.    FAMILY HISTORY Family History  Problem Relation Age of Onset  . Diabetes Father   . Hypertension Father   . Heart disease Father   . Prostate cancer Father 37  . Breast cancer Sister 33       again at age 43, GT results unk  . Skin cancer Sister   . Colon cancer Paternal Grandfather 64  . Hypertension Mother   . Heart failure Mother   The patient's father was diagnosed with prostate cancer in his 58s and died at age 51 from complications of that disease.  The patient's mother died at age 14 with congestive heart failure.  The patient had no brothers, 3 sisters.  One sister had breast cancer at age 22 and again at age 49.  She has been genetically tested but surprisingly the sister does not know her genetics testing results.  In addition a paternal grandfather had colon cancer in his 62s.  There is no history of ovarian cancer in the family to the patient's  knowledge.  GYNECOLOGIC HISTORY:  Patient's last menstrual period was 03/18/1997. Menarche age 34. The patient is GX P0.  She stopped having periods in 1999 and took hormone replacement approximately 5 years.  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 She is single and lives alone with no pets.   ADVANCED DIRECTIVES:    HEALTH MAINTENANCE: Social History   Tobacco Use  . Smoking status: Never Smoker  . Smokeless tobacco: Never Used  Substance Use Topics  . Alcohol use: No    Frequency: Never    Comment: rare  . Drug use: No     Colonoscopy: Never  PAP:  Bone density: 2017/osteopenia   Allergies  Allergen Reactions  . Neosporin [Neomycin-Bacitracin Zn-Polymyx]  Swelling, Rash and Other (See Comments)    Blisters  . Benzalkonium Chloride Other (See Comments)    Redness  . Latex Rash    Current Outpatient Medications  Medication Sig Dispense Refill  . acetaminophen (TYLENOL) 500 MG tablet Take 500 mg by mouth every 6 (six) hours as needed for moderate pain or headache.    . alclomethasone (ACLOVATE) 0.05 % cream Apply to affected area once daily as needed for dermatitis  0  . ciprofloxacin (CIPRO) 500 MG tablet Take 1 tablet (500 mg total) by mouth 2 (two) times daily. 10 tablet 1  . dexamethasone (DECADRON) 4 MG tablet Take 2 tablets (8 mg total) by mouth 2 (two) times daily. Start the day before Taxotere. Then again the day after chemo for 3 days. 30 tablet 1  . HYDROcodone-acetaminophen (NORCO/VICODIN) 5-325 MG tablet Take 1-2 tablets by mouth every 6 (six) hours as needed for moderate pain or severe pain. 10 tablet 0  . HYDROcodone-acetaminophen (NORCO/VICODIN) 5-325 MG tablet Take 1-2 tablets by mouth every 6 (six) hours as needed for moderate pain or severe pain. 10 tablet 0  . Hyprom-Naphaz-Polysorb-Zn Sulf (CLEAR EYES COMPLETE OP) Place 1-2 drops into  both eyes daily as needed (for dry eyes).    Mar[place] Kitchen ibuprofen (ADVIL,MOTRIN) 200 MG tablet Take 200 mg by mouth every 6 (six) hours as needed for headache or moderate pain.    Mar[place] Kitchen lidocaine-prilocaine (EMLA) cream Apply to affected area once 30 g 3  . LORazepam (ATIVAN) 0.5 MG tablet Take 1 tablet (0.5 mg total) by mouth at bedtime as needed (Nausea or vomiting). 30 tablet 0  . ondansetron (ZOFRAN) 8 MG tablet Take 1 tablet (8 mg total) by mouth 2 (two) times daily as needed for refractory nausea / vomiting. Start on day 3 after chemo. 30 tablet 1  . prochlorperazine (COMPAZINE) 10 MG tablet Take 1 tablet (10 mg total) by mouth every 6 (six) hours as needed (Nausea or vomiting). 30 tablet 1  . XIIDRA 5 % SOLN Place 1 drop into both eyes daily.   1   No current facility-administered medications for this visit.     OBJECTIVE: Middle-aged white woman in no acute distress; RN was present during today's exam  Vitals:   03/13/17 1230  BP: 137/78  Pulse: 76  Resp: 18  Temp: (!) 97.5 F (36.4 C)  SpO2: 99%     Body mass index is 32.34 kg/m.   Wt Readings from Last 3 Encounters:  03/13/17 219 lb (99.3 kg)  03/09/17 216 lb 9.6 oz (98.2 kg)  03/02/17 216 lb (98 kg)      ECOG FS:0 - Asymptomatic  Sclerae unicteric, pupils round and equal Oropharynx clear and moist No cervical or supraclavicular adenopathy Lungs no rales or rhonchi Heart regular rate and rhythm Abd soft, nontender, positive bowel sounds MSK no focal spinal tenderness, no upper extremity lymphedema Neuro: nonfocal, well oriented, appropriate affect Breasts: Deferred Pelvic: The external genitalia are normal and not inflamed or irritated.  There is a minimal scattered non-erythematous acne form rash involving part of the perineum    LAB RESULTS:  CMP     Component Value Date/Time   NA 136 03/09/2017 1308   NA 141 03/03/2017 1358   K 4.1 03/09/2017 1308   K 3.3 (L) 03/03/2017 1358   CL 99 03/09/2017 1308   CO2 27  03/09/2017 1308   CO2 28 03/03/2017 1358   GLUCOSE 96 03/09/2017 1308   GLUCOSE 107 03/03/2017 1358  BUN 11 03/09/2017 1308   BUN 12.7 03/03/2017 1358   CREATININE 0.82 03/09/2017 1308   CREATININE 0.9 03/03/2017 1358   CALCIUM 9.4 03/09/2017 1308   CALCIUM 9.4 03/03/2017 1358   PROT 6.7 03/09/2017 1308   PROT 6.5 03/03/2017 1358   ALBUMIN 3.6 03/09/2017 1308   ALBUMIN 3.7 03/03/2017 1358   AST 13 03/09/2017 1308   AST 23 03/03/2017 1358   ALT 37 03/09/2017 1308   ALT 25 03/03/2017 1358   ALKPHOS 89 03/09/2017 1308   ALKPHOS 71 03/03/2017 1358   BILITOT 1.2 03/09/2017 1308   BILITOT 0.52 03/03/2017 1358   GFRNONAA >60 03/09/2017 1308   GFRAA >60 03/09/2017 1308    No results found for: TOTALPROTELP, ALBUMINELP, A1GS, A2GS, BETS, BETA2SER, GAMS, MSPIKE, SPEI  No results found for: Nils Pyle, [facility]  Lab Results  Component Value Date   WBC 22.5 (H) 03/13/2017   NEUTROABS 18.6 (H) 03/13/2017   HGB 13.7 03/13/2017   HCT 42.7 03/13/2017   MCV 90.7 03/13/2017   PLT 220 03/13/2017      Chemistry      Component Value Date/Time   NA 136 03/09/2017 1308   NA 141 03/03/2017 1358   K 4.1 03/09/2017 1308   K 3.3 (L) 03/03/2017 1358   CL 99 03/09/2017 1308   CO2 27 03/09/2017 1308   CO2 28 03/03/2017 1358   BUN 11 03/09/2017 1308   BUN 12.7 03/03/2017 1358   CREATININE 0.82 03/09/2017 1308   CREATININE 0.9 03/03/2017 1358      Component Value Date/Time   CALCIUM 9.4 03/09/2017 1308   CALCIUM 9.4 03/03/2017 1358   ALKPHOS 89 03/09/2017 1308   ALKPHOS 71 03/03/2017 1358   AST 13 03/09/2017 1308   AST 23 03/03/2017 1358   ALT 37 03/09/2017 1308   ALT 25 03/03/2017 1358   BILITOT 1.2 03/09/2017 1308   BILITOT 0.52 03/03/2017 1358       No results found for: LABCA2  No components found for: [id number]  No results for input(s): INR in the last 168 hours.  No results found for: LABCA2  No results found for: ION629  No results found for:  BMW413  No results found for: KGM010  No results found for: CA2729  No components found for: HGQUANT  No results found for: CEA1 / No results found for: CEA1   No results found for: AFPTUMOR  No results found for: CHROMOGRNA  No results found for: PSA1  Appointment on 03/13/2017  Component Date Value Ref Range Status  . WBC 03/13/2017 22.5* 3.9 - 10.3 K/uL Final  . RBC 03/13/2017 4.71  3.70 - 5.45 MIL/uL Final  . Hemoglobin 03/13/2017 13.7  11.6 - 15.9 g/dL Final  . HCT 03/13/2017 42.7  34.8 - 46.6 % Final  . MCV 03/13/2017 90.7  79.5 - 101.0 fL Final  . MCH 03/13/2017 29.1  25.1 - 34.0 pg Final  . MCHC 03/13/2017 32.1  31.5 - 36.0 g/dL Final  . RDW 03/13/2017 13.2  11.2 - 16.1 % Final  . Platelets 03/13/2017 220  145 - 400 K/uL Final  . Neutrophils Relative % 03/13/2017 82  % Final  . Neutro Abs 03/13/2017 18.6* 1.5 - 6.5 K/uL Final  . Lymphocytes Relative 03/13/2017 10  % Final  . Lymphs Abs 03/13/2017 2.2  0.9 - 3.3 K/uL Final  . Monocytes Relative 03/13/2017 7  % Final  . Monocytes Absolute 03/13/2017 1.6* 0.1 - 0.9 K/uL Final  . Eosinophils  Relative 03/13/2017 0  % Final  . Eosinophils Absolute 03/13/2017 0.0  0.0 - 0.5 K/uL Final  . Basophils Relative 03/13/2017 1  % Final  . Basophils Absolute 03/13/2017 0.1  0.0 - 0.1 K/uL Final   Performed at [facility], [address] [place], [place] [address]    (this displays the last labs from the last 3 days)  No results found for: TOTALPROTELP, ALBUMINELP, A1GS, A2GS, BETS, BETA2SER, GAMS, MSPIKE, SPEI (this displays SPEP labs)  No results found for: KPAFRELGTCHN, LAMBDASER, KAPLAMBRATIO (kappa/lambda light chains)  No results found for: HGBA, HGBA2QUANT, HGBFQUANT, HGBSQUAN (Hemoglobinopathy evaluation)   No results found for: LDH  No results found for: IRON, TIBC, IRONPCTSAT (Iron and TIBC)  No results found for: FERRITIN  Urinalysis    Component Value Date/Time   COLORURINE  YELLOW 05/29/2015 [place] 05/29/2015 1113   LABSPEC 1.023 05/29/2015 1113   PHURINE 5.5 05/29/2015 1113   GLUCOSEU NEGATIVE 05/29/2015 1113   GLUCOSEU NEGATIVE 11/05/2012 1559   HGBUR NEGATIVE 05/29/2015 1113   BILIRUBINUR negative 02/10/2017 1622   KETONESUR NEGATIVE 05/29/2015 1113   PROTEINUR negative 02/10/2017 1622   PROTEINUR NEGATIVE 05/29/2015 1113   UROBILINOGEN 0.2 02/10/2017 1622   UROBILINOGEN 0.2 11/05/2012 1559   NITRITE negative 02/10/2017 1622   NITRITE NEGATIVE 05/29/2015 1113   LEUKOCYTESUR Negative 02/10/2017 1622     STUDIES: Dg Chest Port 1 View  Result Date: 03/02/2017 CLINICAL DATA:  Status post Port-A-Cath placement EXAM: PORTABLE CHEST 1 VIEW COMPARISON:  None. FINDINGS: There is a right-sided Port-A-Cath with the tip projecting over the SVC. There is no focal parenchymal opacity. There is no pleural effusion or pneumothorax. The heart and mediastinal contours are unremarkable. The osseous structures are unremarkable. IMPRESSION: No active disease. Electronically Signed   By: Kathreen Devoid   On: 03/02/2017 12:23   Dg Fluoro Guide Cv Line-no Report  Result Date: 03/02/2017 Fluoroscopy was utilized by the requesting physician.  No radiographic interpretation.    ELIGIBLE FOR AVAILABLE RESEARCH PROTOCOL: No  ASSESSMENT: 67 y.o. [name] woman status post bilateral biopsies 01/26/2017, showing  (1) in the right breast, a complex sclerosing lesion  (2) in the left breast, a cT2 pN1 invasive ductal carcinoma (with some lobular features but E-cadherin positive), grade 2, estrogen and progesterone receptor positive, HER-2 not amplified, with an MIB-1 of 15-20%  (a) breast MRI 02/04/2017 suggests a T3 N1 tumor  (3) started neoadjuvant cyclophosphamide/docetaxel 03/03/2017, discontinued after 1 cycle with very poor tolerance  (a) to start cyclophosphamide/methotrexate/fluorouracil (CMF) 03/28/2017, to be repeated for 7 cycles 21 days apart  (4)  definitive surgery to follow  (5) adjuvant radiation to follow surgery  ( 6) to start antiestrogens at the completion of local treatment  PLAN: Brooke [name] has largely recovered from her horrible experience with Taxotere, but we clearly cannot go back to that regimen.  I was going to suggest as the options CMF versus CA/D, but if we use the latter in addition to the concerns regarding weakening of the heart muscle, we would have to use steroids.  I think she tolerated steroids very poorly.  It caused them the stomach problems in the perineal rash.  Accordingly we are going to go with CMF we discussed the possible toxicities, side effects and complications of these various agents in detail today.  She would like to move the treatment dates to Tuesdays and that has been arranged.  I am going to see her on day  8 after cycle 1 to make sure there are no unusual side effects.  I have asked her to keep a diary daily after her treatment for the first week.  I have also asked her to please call us with any problems or not simply wait until the visit.  That way we can intervene earlier if any problems arise.  The plan then at this point is for 7 cycles of CMF followed by surgery.  It may be prudent to repeat a breast MRI after the fourth cycle of chemotherapy, the third cycle of CMF, to make sure we are continuing to make progress  She knows to call for any issues that may develop before the next.   , Virgie Dad, MD  03/13/17 12:58 PM Medical Oncology and Hematology [facility] [address] [place], [place] [address] Tel. [phone]    Fax. [phone]  This document serves as a record of services personally performed by Lurline Del, MD. It was created on his behalf by Sheron Nightingale, a trained medical scribe. The creation of this record is based on the scribe's personal observations and the provider's statements to them.   I have reviewed the above documentation for accuracy  and completeness, and I agree with the above. '

## 2017-03-13 NOTE — Progress Notes (Signed)
DISCONTINUE ON PATHWAY REGIMEN - Breast     A cycle is every 21 days:     Docetaxel      Cyclophosphamide   **Always confirm dose/schedule in your pharmacy ordering system**    REASON: Other Reason PRIOR TREATMENT: [id number]: TC - Docetaxel, Cyclophosphamide q21 Days x 4 Cycles TREATMENT RESPONSE: Unable to Evaluate  START OFF PATHWAY REGIMEN - Breast   [id number]:CMF (IV cyclophosphamide) q21 days:   A cycle is every 21 days:     Cyclophosphamide      Methotrexate      5-Fluorouracil   **Always confirm dose/schedule in your pharmacy ordering system**    Patient Characteristics: Preoperative or Nonsurgical Candidate (Clinical Staging), Neoadjuvant Therapy followed by Surgery, Invasive Disease, Chemotherapy, HER2 Negative/Unknown/Equivocal, ER Positive Therapeutic Status: Preoperative or Nonsurgical Candidate (Clinical Staging) AJCC M Category: cM0 AJCC Grade: G2 Breast Surgical Plan: Neoadjuvant Therapy followed by Surgery ER Status: Positive (+) AJCC 8 Stage Grouping: IIA HER2 Status: Negative (-) AJCC T Category: cT3 AJCC N Category: cN1 PR Status: Positive (+) Intent of Therapy: Curative Intent, Discussed with Patient

## 2017-03-13 NOTE — Telephone Encounter (Signed)
Gave patient avs and calendar with appts per 1/14 los.  °

## 2017-03-13 NOTE — Addendum Note (Signed)
Addended by: Chauncey Cruel on: 03/13/2017 05:51 PM   Modules accepted: Orders

## 2017-03-16 ENCOUNTER — Encounter: Payer: BC Managed Care – PPO | Admitting: Gynecology

## 2017-03-20 NOTE — Progress Notes (Signed)
[place]  [phone] [phone]) [phone]     ID: KENNIE KARAPETIAN DOB: 07-Apr-1950  MR#: [id number]  KVQ#:[id number]  Patient Care Team: [name], DO as PCP - General (Family Medicine) Phineas Real, Belinda Block, MD (Obstetrics and Gynecology) Rolm Bookbinder, MD as Attending Physician (Dermatology) Tobechukwu Emmick, Virgie Dad, MD as Consulting Physician (Oncology) Kyung Rudd, MD as Consulting Physician (Radiation Oncology) Jovita Kussmaul, MD as Consulting Physician (General Surgery) OTHER MD:  CHIEF COMPLAINT: Estrogen receptor positive breast cancer  CURRENT TREATMENT: Neoadjuvant chemotherapy   HISTORY OF CURRENT ILLNESS: From the original intake note:  Lasya herself palpated a change in her left breast and brought this to medical attention.  On 01/18/2017 she underwent bilateral diagnostic mammography with tomography and bilateral breast ultrasonography at [facility].  The breast density was category B.  At the most recent exam was from December 2016.  In the right breast upper outer quadrant there was a 1 cm irregular mass associated with punctate calcification.  By ultrasound this was not well-defined, and it was biopsied on 01/26/2017 with tomographic guidance this showed a complex sclerosing lesion with the usual ductal hyperplasia.  The right axilla was sonographically  On the left mammography showed a 4 cm dense mass with indistinct margins in the upper inner quadrant.  Ultrasound defined a 3 cm irregular mass in the upper inner quadrant of the left breast which was palpable.  There was an abnormal lymph node in the left axilla.  Biopsy of the left mass and abnormal lymph node ([id number], on 01/26/2017) showed both to be involved by invasive ductal carcinoma, with some lobular features but E-cadherin positive, estrogen and progesterone receptors both 100% positive, with strong staining intensity, with MIB-1 between 15-20%.  There was no HER-2 amplification, the  signals ratio being 1.31/1.58 and the number per cell 2.30/2.05  The patient's subsequent history is as detailed below.  [place] returns today for follow up and treatment of her estrogen receptor positive breast cancer. She received 1 cycle of cyclophosphamide/docetaxel on 03/03/2017, which she tolerated very poorly.  Today is day 1 cycle 1 of 7 planned cycles of CMF chemotherapy, given 21 days apart.  She reports that she is doing well overall. She reports that she didn't take the compazine with her last chemo treatment.      REVIEW OF SYSTEMS: Damary reports that for exercise, she has started exercising 1 hour a day within the last week. She has constant rhinorrhea x clear and occasional thick yellow sputum and congestion. She hasn't tried taking claritin or any other anti-histamines to alleviate her symptoms. She denies persistent cough. She reports that she is eating more foods and she notes that she would like to consistently lose weight but she is unsure if that would have an adverse effect on her chemotherapy treatments. She has begun to lose her hair and she is feeling sad due to this. She denies unusual headaches, visual changes, nausea, vomiting, or dizziness. There has been no pleurisy. This been no change in bowel or bladder habits. She denies unexplained fatigue or unexplained weight loss, bleeding, rash, or fever. A detailed review of systems was otherwise stable.       PAST MEDICAL HISTORY: Past Medical History:  Diagnosis Date  . Anemia   . Anxiety   . Arthritis   . Bilateral dry eyes   . Cancer ([place])   . Family history of breast cancer 02/02/2017  . Family history of colon cancer 02/02/2017  .  Family history of prostate cancer in father 02/02/2017  . Fibroid   . History of kidney stones   . Hyperlipidemia   . Kidney stones   . Melanoma of upper arm ([place]) 2009   RIGHT ARM ; left breast cancer, squamous cell carcinoma of the skin  . Obesity (BMI 30.0-34.9)   .  Osteopenia 05/2015   T score -1.3 FRAX 6%/0.5% stable from prior DEXA  . PONV (postoperative nausea and vomiting)   . UTI (urinary tract infection)   . Varicose veins of both lower extremities   . Vitamin D deficiency 07/2009   LOW VITAMIN D 29  . Vitamin deficiency 07/2008   VITAMIN D LOW 23    PAST SURGICAL HISTORY: Past Surgical History:  Procedure Laterality Date  . ABDOMINAL SURGERY  1975   ovairan cyst   . APPENDECTOMY    . BREAST SURGERY     bio  . CHOLECYSTECTOMY    . DERMOID CYST  EXCISION    . DILATION AND CURETTAGE OF UTERUS  1998  . GALLBLADDER SURGERY    . HYSTEROSCOPY    . insertion  port a cath  03/02/2016  . PORTACATH PLACEMENT N/A 03/02/2017   Procedure: INSERTION PORT-A-CATH;  Surgeon: Jovita Kussmaul, MD;  Location: [place];  Service: General;  Laterality: N/A;  . SKIN BIOPSY    . SKIN CANCER EXCISION  2010,2011   2010-RIGHT ARM, 2011-LEFT LOWER LEG.-DR Rolm Bookbinder DERM. DR. Sarajane Jews IS HER DERM SURGEON.    FAMILY HISTORY Family History  Problem Relation Age of Onset  . Diabetes Father   . Hypertension Father   . Heart disease Father   . Prostate cancer Father 67  . Breast cancer Sister 69       again at age 67, GT results unk  . Skin cancer Sister   . Colon cancer Paternal Grandfather 73  . Hypertension Mother   . Heart failure Mother   The patient's father was diagnosed with prostate cancer in his 80s and died at age 79 from complications of that disease.  The patient's mother died at age 65 with congestive heart failure.  The patient had no brothers, 3 sisters.  One sister had breast cancer at age 67 and again at age 67.  She has been genetically tested but surprisingly the sister does not know her genetics testing results.  In addition a paternal grandfather had colon cancer in his 65s.  There is no history of ovarian cancer in the family to the patient's knowledge.  GYNECOLOGIC HISTORY:  Patient's last menstrual period was 03/18/1997. Menarche age 6.  The patient is GX P0.  She stopped having periods in 1999 and took hormone replacement approximately 5 years.  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 She is single and lives alone with no pets.   ADVANCED DIRECTIVES:    HEALTH MAINTENANCE: Social History   Tobacco Use  . Smoking status: Never Smoker  . Smokeless tobacco: Never Used  Substance Use Topics  . Alcohol use: No    Frequency: Never    Comment: rare  . Drug use: No     Colonoscopy: Never  PAP:  Bone density: 2017/osteopenia   Allergies  Allergen Reactions  . Neosporin [Neomycin-Bacitracin Zn-Polymyx] Swelling, Rash and Other (See Comments)    Blisters  . Benzalkonium Chloride Other (See Comments)    Redness  . Latex Rash  Current Outpatient Medications  Medication Sig Dispense Refill  . acetaminophen (TYLENOL) 500 MG tablet Take 500 mg by mouth every 6 (six) hours as needed for moderate pain or headache.    . alclomethasone (ACLOVATE) 0.05 % cream Apply to affected area once daily as needed for dermatitis  0  . Hyprom-Naphaz-Polysorb-Zn Sulf (CLEAR EYES COMPLETE OP) Place 1-2 drops into both eyes daily as needed (for dry eyes).    Marland Kitchen ibuprofen (ADVIL,MOTRIN) 200 MG tablet Take 200 mg by mouth every 6 (six) hours as needed for headache or moderate pain.     No current facility-administered medications for this visit.     OBJECTIVE: Middle-aged white woman who appears stated age  7:   03/28/17 1031  BP: (!) 137/49  Pulse: 72  Resp: 18  Temp: 98 F (36.7 C)  SpO2: 99%     Body mass index is 32.34 kg/m.   Wt Readings from Last 3 Encounters:  03/28/17 219 lb (99.3 kg)  03/13/17 219 lb (99.3 kg)  03/09/17 216 lb 9.6 oz (98.2 kg)      ECOG FS:0 - Asymptomatic  Sclerae unicteric, pupils round and equal Oropharynx clear and moist No cervical or supraclavicular adenopathy Lungs  no rales or rhonchi Heart regular rate and rhythm Abd soft, nontender, positive bowel sounds MSK no focal spinal tenderness, no upper extremity lymphedema Neuro: nonfocal, well oriented, appropriate affect Breasts: Deferred  LAB RESULTS:  CMP     Component Value Date/Time   NA 140 03/13/2017 1208   NA 141 03/03/2017 1358   K 3.9 03/13/2017 1208   K 3.3 (L) 03/03/2017 1358   CL 104 03/13/2017 1208   CO2 26 03/13/2017 1208   CO2 28 03/03/2017 1358   GLUCOSE 95 03/13/2017 1208   GLUCOSE 107 03/03/2017 1358   BUN 6 (L) 03/13/2017 1208   BUN 12.7 03/03/2017 1358   CREATININE 0.86 03/13/2017 1208   CREATININE 0.9 03/03/2017 1358   CALCIUM 9.1 03/13/2017 1208   CALCIUM 9.4 03/03/2017 1358   PROT 6.7 03/13/2017 1208   PROT 6.5 03/03/2017 1358   ALBUMIN 3.7 03/13/2017 1208   ALBUMIN 3.7 03/03/2017 1358   AST 14 03/13/2017 1208   AST 23 03/03/2017 1358   ALT 25 03/13/2017 1208   ALT 25 03/03/2017 1358   ALKPHOS 103 03/13/2017 1208   ALKPHOS 71 03/03/2017 1358   BILITOT 0.4 03/13/2017 1208   BILITOT 0.52 03/03/2017 1358   GFRNONAA >60 03/13/2017 1208   GFRAA >60 03/13/2017 1208    No results found for: TOTALPROTELP, ALBUMINELP, A1GS, A2GS, BETS, BETA2SER, GAMS, MSPIKE, SPEI  No results found for: KPAFRELGTCHN, LAMBDASER, [facility]  Lab Results  Component Value Date   WBC 4.3 03/28/2017   NEUTROABS 2.7 03/28/2017   HGB 13.7 03/13/2017   HCT 35.9 03/28/2017   MCV 87.6 03/28/2017   PLT 300 03/28/2017      Chemistry      Component Value Date/Time   NA 140 03/13/2017 1208   NA 141 03/03/2017 1358   K 3.9 03/13/2017 1208   K 3.3 (L) 03/03/2017 1358   CL 104 03/13/2017 1208   CO2 26 03/13/2017 1208   CO2 28 03/03/2017 1358   BUN 6 (L) 03/13/2017 1208   BUN 12.7 03/03/2017 1358   CREATININE 0.86 03/13/2017 1208   CREATININE 0.9 03/03/2017 1358      Component Value Date/Time   CALCIUM 9.1 03/13/2017 1208   CALCIUM 9.4 03/03/2017 1358   ALKPHOS 103 03/13/2017  1208  ALKPHOS 71 03/03/2017 1358   AST 14 03/13/2017 1208   AST 23 03/03/2017 1358   ALT 25 03/13/2017 1208   ALT 25 03/03/2017 1358   BILITOT 0.4 03/13/2017 1208   BILITOT 0.52 03/03/2017 1358       No results found for: LABCA2  No components found for: UYQIHK742  No results for input(s): INR in the last 168 hours.  No results found for: LABCA2  No results found for: VZD638  No results found for: VFI433  No results found for: IRJ188  No results found for: CA2729  No components found for: HGQUANT  No results found for: CEA1 / No results found for: CEA1   No results found for: AFPTUMOR  No results found for: [place]  No results found for: PSA1  Appointment on 03/28/2017  Component Date Value Ref Range Status  . WBC Count 03/28/2017 4.3  3.9 - 10.3 K/uL Final  . RBC 03/28/2017 4.10  3.70 - 5.45 MIL/uL Final  . Hemoglobin 03/28/2017 12.0  11.6 - 15.9 g/dL Final  . HCT 03/28/2017 35.9  34.8 - 46.6 % Final  . MCV 03/28/2017 87.6  79.5 - 101.0 fL Final  . MCH 03/28/2017 29.3  25.1 - 34.0 pg Final  . MCHC 03/28/2017 33.4  31.5 - 36.0 g/dL Final  . RDW 03/28/2017 14.2  11.2 - 16.1 % Final  . Platelet Count 03/28/2017 300  145 - 400 K/uL Final  . Neutrophils Relative % 03/28/2017 64  % Final  . Neutro Abs 03/28/2017 2.7  1.5 - 6.5 K/uL Final  . Lymphocytes Relative 03/28/2017 18  % Final  . Lymphs Abs 03/28/2017 0.8* 0.9 - 3.3 K/uL Final  . Monocytes Relative 03/28/2017 13  % Final  . Monocytes Absolute 03/28/2017 0.5  0.1 - 0.9 K/uL Final  . Eosinophils Relative 03/28/2017 6  % Final  . Eosinophils Absolute 03/28/2017 0.2  0.0 - 0.5 K/uL Final  . Basophils Relative 03/28/2017 1  % Final  . Basophils Absolute 03/28/2017 0.0  0.0 - 0.1 K/uL Final   Performed at [facility], [address] [place], [place] [address]    (this displays the last labs from the last 3 days)  No results found for: TOTALPROTELP, ALBUMINELP, A1GS, A2GS, BETS,  BETA2SER, GAMS, MSPIKE, SPEI (this displays SPEP labs)  No results found for: KPAFRELGTCHN, LAMBDASER, KAPLAMBRATIO (kappa/lambda light chains)  No results found for: HGBA, HGBA2QUANT, HGBFQUANT, HGBSQUAN (Hemoglobinopathy evaluation)   No results found for: LDH  No results found for: IRON, TIBC, IRONPCTSAT (Iron and TIBC)  No results found for: FERRITIN  Urinalysis    Component Value Date/Time   COLORURINE YELLOW 05/29/2015 [place] 05/29/2015 1113   LABSPEC 1.023 05/29/2015 1113   PHURINE 5.5 05/29/2015 1113   GLUCOSEU NEGATIVE 05/29/2015 1113   GLUCOSEU NEGATIVE 11/05/2012 1559   HGBUR NEGATIVE 05/29/2015 1113   BILIRUBINUR negative 02/10/2017 1622   KETONESUR NEGATIVE 05/29/2015 1113   PROTEINUR negative 02/10/2017 1622   PROTEINUR NEGATIVE 05/29/2015 1113   UROBILINOGEN 0.2 02/10/2017 1622   UROBILINOGEN 0.2 11/05/2012 1559   NITRITE negative 02/10/2017 1622   NITRITE NEGATIVE 05/29/2015 1113   LEUKOCYTESUR Negative 02/10/2017 1622     STUDIES: Dg Chest Port 1 View  Result Date: 03/02/2017 CLINICAL DATA:  Status post Port-A-Cath placement EXAM: PORTABLE CHEST 1 VIEW COMPARISON:  None. FINDINGS: There is a right-sided Port-A-Cath with the tip projecting over the SVC. There is no focal parenchymal opacity. There is  no pleural effusion or pneumothorax. The heart and mediastinal contours are unremarkable. The osseous structures are unremarkable. IMPRESSION: No active disease. Electronically Signed   By: Kathreen Devoid   On: 03/02/2017 12:23   Dg Fluoro Guide Cv Line-no Report  Result Date: 03/02/2017 Fluoroscopy was utilized by the requesting physician.  No radiographic interpretation.    ELIGIBLE FOR AVAILABLE RESEARCH PROTOCOL: No  ASSESSMENT: 67 y.o. [place] woman status post bilateral biopsies 01/26/2017, showing  (1) in the right breast, a complex sclerosing lesion  (2) in the left breast, a cT2 pN1 invasive ductal carcinoma (with some  lobular features but E-cadherin positive), grade 2, estrogen and progesterone receptor positive, HER-2 not amplified, with an MIB-1 of 15-20%  (a) breast MRI 02/04/2017 suggests a T3 N1 tumor  (3) started neoadjuvant cyclophosphamide/docetaxel 03/03/2017, discontinued after 1 cycle with very poor tolerance  (a) to start cyclophosphamide/methotrexate/fluorouracil (CMF) 03/28/2017, to be repeated for 7 cycles 21 days apart  (4) definitive surgery to follow  (5) adjuvant radiation to follow surgery  ( 6) to start antiestrogens at the completion of local treatment  PLAN: Randilyn is ready to start her CMF chemotherapy today.  I am hopeful she will tolerated much better than the last.  Part of her problem was dexamethasone and we are avoiding that this time.  She will lean on Compazine and she will take it tonight and tomorrow morning and then as needed.  After 2 or 3 days if she has nausea problems she can add ondansetron.  She is interested in losing weight.  She wanted to lose something like 2 pounds a week.  I do think that is excessive.  I think an appropriate goal would be 1 pound a month but basically she maintains her weight during chemo she will have accomplished quite a bit.  Losing her hair was a shock.  I reassured her that hopefully by cycle 3/4 of CMF her hair should be growing or at least it has a good chance of that occurring  I will see her again in a week just to make sure she tolerates things well.  I asked her to keep a diary of symptoms.  If all is well then I will probably see her only on treatment days in the future  She knows to call for any other issues that may develop before the next visit.   Yukiko Minnich, Virgie Dad, MD  03/28/17 10:36 AM Medical Oncology and Hematology [facility] [address] [place], [place] [address] Tel. [phone]    Fax. [phone]  Dayana Dalporto, Virgie Dad, MD  03/28/17 10:36 AM Medical Oncology and Hematology [facility] [address] [place], [place] [address] Tel. [phone]    Fax. [phone]    This document serves as a record of services personally performed by Lurline Del, MD. It was created on his behalf by Steva Colder, a trained medical scribe. The creation of this record is based on the scribe's personal observations and the provider's statements to them.   I have reviewed the above documentation for accuracy and completeness, and I agree with the above.

## 2017-03-24 ENCOUNTER — Ambulatory Visit: Payer: BC Managed Care – PPO

## 2017-03-24 ENCOUNTER — Other Ambulatory Visit: Payer: BC Managed Care – PPO

## 2017-03-24 ENCOUNTER — Ambulatory Visit: Payer: BC Managed Care – PPO | Admitting: Adult Health

## 2017-03-27 ENCOUNTER — Other Ambulatory Visit: Payer: Self-pay | Admitting: *Deleted

## 2017-03-27 DIAGNOSIS — C50812 Malignant neoplasm of overlapping sites of left female breast: Secondary | ICD-10-CM

## 2017-03-27 DIAGNOSIS — Z170 Estrogen receptor positive status [ER+]: Principal | ICD-10-CM

## 2017-03-28 ENCOUNTER — Encounter: Payer: Self-pay | Admitting: *Deleted

## 2017-03-28 ENCOUNTER — Inpatient Hospital Stay: Payer: Medicare Other

## 2017-03-28 ENCOUNTER — Telehealth: Payer: Self-pay | Admitting: Oncology

## 2017-03-28 ENCOUNTER — Inpatient Hospital Stay (HOSPITAL_BASED_OUTPATIENT_CLINIC_OR_DEPARTMENT_OTHER): Payer: Medicare Other | Admitting: Oncology

## 2017-03-28 VITALS — BP 137/49 | HR 72 | Temp 98.0°F | Resp 18 | Ht 69.0 in | Wt 219.0 lb

## 2017-03-28 DIAGNOSIS — C50212 Malignant neoplasm of upper-inner quadrant of left female breast: Secondary | ICD-10-CM | POA: Diagnosis not present

## 2017-03-28 DIAGNOSIS — M1990 Unspecified osteoarthritis, unspecified site: Secondary | ICD-10-CM

## 2017-03-28 DIAGNOSIS — C50812 Malignant neoplasm of overlapping sites of left female breast: Secondary | ICD-10-CM

## 2017-03-28 DIAGNOSIS — J3489 Other specified disorders of nose and nasal sinuses: Secondary | ICD-10-CM

## 2017-03-28 DIAGNOSIS — Z803 Family history of malignant neoplasm of breast: Secondary | ICD-10-CM | POA: Diagnosis not present

## 2017-03-28 DIAGNOSIS — Z170 Estrogen receptor positive status [ER+]: Secondary | ICD-10-CM | POA: Diagnosis not present

## 2017-03-28 DIAGNOSIS — G893 Neoplasm related pain (acute) (chronic): Secondary | ICD-10-CM

## 2017-03-28 DIAGNOSIS — R05 Cough: Secondary | ICD-10-CM | POA: Diagnosis not present

## 2017-03-28 DIAGNOSIS — N6489 Other specified disorders of breast: Secondary | ICD-10-CM

## 2017-03-28 DIAGNOSIS — Z8042 Family history of malignant neoplasm of prostate: Secondary | ICD-10-CM | POA: Diagnosis not present

## 2017-03-28 DIAGNOSIS — Z85820 Personal history of malignant melanoma of skin: Secondary | ICD-10-CM | POA: Diagnosis not present

## 2017-03-28 DIAGNOSIS — Z95828 Presence of other vascular implants and grafts: Secondary | ICD-10-CM

## 2017-03-28 DIAGNOSIS — Z79899 Other long term (current) drug therapy: Secondary | ICD-10-CM | POA: Diagnosis not present

## 2017-03-28 DIAGNOSIS — D701 Agranulocytosis secondary to cancer chemotherapy: Secondary | ICD-10-CM | POA: Diagnosis not present

## 2017-03-28 DIAGNOSIS — E785 Hyperlipidemia, unspecified: Secondary | ICD-10-CM | POA: Diagnosis not present

## 2017-03-28 DIAGNOSIS — Z800 Family history of malignant neoplasm of digestive organs: Secondary | ICD-10-CM

## 2017-03-28 DIAGNOSIS — T451X5S Adverse effect of antineoplastic and immunosuppressive drugs, sequela: Secondary | ICD-10-CM | POA: Diagnosis not present

## 2017-03-28 DIAGNOSIS — Z5111 Encounter for antineoplastic chemotherapy: Secondary | ICD-10-CM | POA: Diagnosis not present

## 2017-03-28 LAB — CBC WITH DIFFERENTIAL (CANCER CENTER ONLY)
BASOS ABS: 0 10*3/uL (ref 0.0–0.1)
Basophils Relative: 1 %
EOS ABS: 0.2 10*3/uL (ref 0.0–0.5)
EOS PCT: 6 %
HCT: 35.9 % (ref 34.8–46.6)
HEMOGLOBIN: 12 g/dL (ref 11.6–15.9)
LYMPHS ABS: 0.8 10*3/uL — AB (ref 0.9–3.3)
Lymphocytes Relative: 18 %
MCH: 29.3 pg (ref 25.1–34.0)
MCHC: 33.4 g/dL (ref 31.5–36.0)
MCV: 87.6 fL (ref 79.5–101.0)
Monocytes Absolute: 0.5 10*3/uL (ref 0.1–0.9)
Monocytes Relative: 13 %
NEUTROS PCT: 64 %
Neutro Abs: 2.7 10*3/uL (ref 1.5–6.5)
PLATELETS: 300 10*3/uL (ref 145–400)
RBC: 4.1 MIL/uL (ref 3.70–5.45)
RDW: 14.2 % (ref 11.2–16.1)
WBC: 4.3 10*3/uL (ref 3.9–10.3)

## 2017-03-28 LAB — CMP (CANCER CENTER ONLY)
ALT: 10 U/L (ref 0–55)
AST: 12 U/L (ref 5–34)
Albumin: 3.5 g/dL (ref 3.5–5.0)
Alkaline Phosphatase: 73 U/L (ref 40–150)
Anion gap: 6 (ref 3–11)
BILIRUBIN TOTAL: 0.5 mg/dL (ref 0.2–1.2)
BUN: 21 mg/dL (ref 7–26)
CHLORIDE: 108 mmol/L (ref 98–109)
CO2: 25 mmol/L (ref 22–29)
CREATININE: 0.77 mg/dL (ref 0.60–1.10)
Calcium: 9.1 mg/dL (ref 8.4–10.4)
Glucose, Bld: 94 mg/dL (ref 70–140)
POTASSIUM: 4.2 mmol/L (ref 3.3–4.7)
Sodium: 139 mmol/L (ref 136–145)
TOTAL PROTEIN: 6.2 g/dL — AB (ref 6.4–8.3)

## 2017-03-28 MED ORDER — HEPARIN SOD (PORK) LOCK FLUSH 100 UNIT/ML IV SOLN
500.0000 [IU] | Freq: Once | INTRAVENOUS | Status: AC | PRN
  Administered 2017-03-28: 500 [IU]
  Filled 2017-03-28: qty 5

## 2017-03-28 MED ORDER — SODIUM CHLORIDE 0.9% FLUSH
10.0000 mL | INTRAVENOUS | Status: DC | PRN
  Administered 2017-03-28: 10 mL
  Filled 2017-03-28: qty 10

## 2017-03-28 MED ORDER — SODIUM CHLORIDE 0.9% FLUSH
10.0000 mL | INTRAVENOUS | Status: DC | PRN
  Administered 2017-03-28: 10 mL via INTRAVENOUS
  Filled 2017-03-28: qty 10

## 2017-03-28 MED ORDER — DEXAMETHASONE SODIUM PHOSPHATE 10 MG/ML IJ SOLN
10.0000 mg | Freq: Once | INTRAMUSCULAR | Status: AC
  Administered 2017-03-28: 10 mg via INTRAVENOUS

## 2017-03-28 MED ORDER — METHOTREXATE SODIUM (PF) CHEMO INJECTION 250 MG/10ML
40.0000 mg/m2 | Freq: Once | INTRAMUSCULAR | Status: AC
  Administered 2017-03-28: 88 mg via INTRAVENOUS
  Filled 2017-03-28: qty 3.52

## 2017-03-28 MED ORDER — SODIUM CHLORIDE 0.9 % IV SOLN
600.0000 mg/m2 | Freq: Once | INTRAVENOUS | Status: AC
  Administered 2017-03-28: 1320 mg via INTRAVENOUS
  Filled 2017-03-28: qty 66

## 2017-03-28 MED ORDER — SODIUM CHLORIDE 0.9 % IV SOLN: 10.0000 mg | Freq: Once | INTRAVENOUS | Status: DC

## 2017-03-28 MED ORDER — SODIUM CHLORIDE 0.9 % IV SOLN
Freq: Once | INTRAVENOUS | Status: AC
  Administered 2017-03-28: 11:00:00 via INTRAVENOUS

## 2017-03-28 MED ORDER — FLUOROURACIL CHEMO INJECTION 2.5 GM/50ML
600.0000 mg/m2 | Freq: Once | INTRAVENOUS | Status: AC
Start: 2017-03-28 — End: 2017-03-28
  Administered 2017-03-28: 1300 mg via INTRAVENOUS
  Filled 2017-03-28: qty 26

## 2017-03-28 MED ORDER — PALONOSETRON HCL INJECTION 0.25 MG/5ML
0.2500 mg | Freq: Once | INTRAVENOUS | Status: AC
  Administered 2017-03-28: 0.25 mg via INTRAVENOUS

## 2017-03-28 NOTE — Telephone Encounter (Signed)
Gave patient avs and calendar with appts per 1/29 los.  °

## 2017-03-28 NOTE — Patient Instructions (Signed)
[facility] Discharge Instructions for Patients Receiving Chemotherapy  Today you received the following chemotherapy agents Cytoxan, Methotrexate, 5FU  To help prevent nausea and vomiting after your treatment, we encourage you to take your nausea medication as prescribed by MD.   If you develop nausea and vomiting that is not controlled by your nausea medication, call the clinic.   BELOW ARE SYMPTOMS THAT SHOULD BE REPORTED IMMEDIATELY:  *FEVER GREATER THAN 100.5 F  *CHILLS WITH OR WITHOUT FEVER  NAUSEA AND VOMITING THAT IS NOT CONTROLLED WITH YOUR NAUSEA MEDICATION  *UNUSUAL SHORTNESS OF BREATH  *UNUSUAL BRUISING OR BLEEDING  TENDERNESS IN MOUTH AND THROAT WITH OR WITHOUT PRESENCE OF ULCERS  *URINARY PROBLEMS  *BOWEL PROBLEMS  UNUSUAL RASH Items with * indicate a potential emergency and should be followed up as soon as possible.  Feel free to call the clinic should you have any questions or concerns. The clinic phone number is [phone].  Please show the [facility] at check-in to the Emergency Department and triage nurse.  Fluorouracil, 5-FU injection What is this medicine? FLUOROURACIL, 5-FU (flure oh YOOR a sil) is a chemotherapy drug. It slows the growth of cancer cells. This medicine is used to treat many types of cancer like breast cancer, colon or rectal cancer, pancreatic cancer, and stomach cancer. This medicine may be used for other purposes; ask your health care provider or pharmacist if you have questions. COMMON BRAND NAME(S): Adrucil What should I tell my health care provider before I take this medicine? They need to know if you have any of these conditions: -blood disorders -dihydropyrimidine dehydrogenase (DPD) deficiency -infection (especially a virus infection such as chickenpox, cold sores, or herpes) -kidney disease -liver disease -malnourished, poor nutrition -recent or ongoing radiation therapy -an unusual or allergic  reaction to fluorouracil, other chemotherapy, other medicines, foods, dyes, or preservatives -pregnant or trying to get pregnant -breast-feeding How should I use this medicine? This drug is given as an infusion or injection into a vein. It is administered in a hospital or clinic by a specially trained health care professional. Talk to your pediatrician regarding the use of this medicine in children. Special care may be needed. Overdosage: If you think you have taken too much of this medicine contact a poison control center or emergency room at once. NOTE: This medicine is only for you. Do not share this medicine with others. What if I miss a dose? It is important not to miss your dose. Call your doctor or health care professional if you are unable to keep an appointment. What may interact with this medicine? -allopurinol -cimetidine -dapsone -digoxin -hydroxyurea -leucovorin -levamisole -medicines for seizures like ethotoin, fosphenytoin, phenytoin -medicines to increase blood counts like filgrastim, pegfilgrastim, sargramostim -medicines that treat or prevent blood clots like warfarin, enoxaparin, and dalteparin -methotrexate -metronidazole -pyrimethamine -some other chemotherapy drugs like busulfan, cisplatin, estramustine, vinblastine -trimethoprim -trimetrexate -vaccines Talk to your doctor or health care professional before taking any of these medicines: -acetaminophen -aspirin -ibuprofen -ketoprofen -naproxen This list may not describe all possible interactions. Give your health care provider a list of all the medicines, herbs, non-prescription drugs, or dietary supplements you use. Also tell them if you smoke, drink alcohol, or use illegal drugs. Some items may interact with your medicine. What should I watch for while using this medicine? Visit your doctor for checks on your progress. This drug may make you feel generally unwell. This is not uncommon, as chemotherapy can  affect healthy cells  as well as cancer cells. Report any side effects. Continue your course of treatment even though you feel ill unless your doctor tells you to stop. In some cases, you may be given additional medicines to help with side effects. Follow all directions for their use. Call your doctor or health care professional for advice if you get a fever, chills or sore throat, or other symptoms of a cold or flu. Do not treat yourself. This drug decreases your body's ability to fight infections. Try to avoid being around people who are sick. This medicine may increase your risk to bruise or bleed. Call your doctor or health care professional if you notice any unusual bleeding. Be careful brushing and flossing your teeth or using a toothpick because you may get an infection or bleed more easily. If you have any dental work done, tell your dentist you are receiving this medicine. Avoid taking products that contain aspirin, acetaminophen, ibuprofen, naproxen, or ketoprofen unless instructed by your doctor. These medicines may hide a fever. Do not become pregnant while taking this medicine. Women should inform their doctor if they wish to become pregnant or think they might be pregnant. There is a potential for serious side effects to an unborn child. Talk to your health care professional or pharmacist for more information. Do not breast-feed an infant while taking this medicine. Men should inform their doctor if they wish to father a child. This medicine may lower sperm counts. Do not treat diarrhea with over the counter products. Contact your doctor if you have diarrhea that lasts more than 2 days or if it is severe and watery. This medicine can make you more sensitive to the sun. Keep out of the sun. If you cannot avoid being in the sun, wear protective clothing and use sunscreen. Do not use sun lamps or tanning beds/booths. What side effects may I notice from receiving this medicine? Side effects that  you should report to your doctor or health care professional as soon as possible: -allergic reactions like skin rash, itching or hives, swelling of the face, lips, or tongue -low blood counts - this medicine may decrease the number of white blood cells, red blood cells and platelets. You may be at increased risk for infections and bleeding. -signs of infection - fever or chills, cough, sore throat, pain or difficulty passing urine -signs of decreased platelets or bleeding - bruising, pinpoint red spots on the skin, black, tarry stools, blood in the urine -signs of decreased red blood cells - unusually weak or tired, fainting spells, lightheadedness -breathing problems -changes in vision -chest pain -mouth sores -nausea and vomiting -pain, swelling, redness at site where injected -pain, tingling, numbness in the hands or feet -redness, swelling, or sores on hands or feet -stomach pain -unusual bleeding Side effects that usually do not require medical attention (report to your doctor or health care professional if they continue or are bothersome): -changes in finger or toe nails -diarrhea -dry or itchy skin -hair loss -headache -loss of appetite -sensitivity of eyes to the light -stomach upset -unusually teary eyes This list may not describe all possible side effects. Call your doctor for medical advice about side effects. You may report side effects to FDA at 1-800-FDA-1088. Where should I keep my medicine? This drug is given in a hospital or clinic and will not be stored at home. NOTE: This sheet is a summary. It may not cover all possible information. If you have questions about this medicine, talk to your  doctor, pharmacist, or health care provider.  2018 Elsevier/Gold Standard (2007-06-20 13:53:16)    Methotrexate injection What is this medicine? METHOTREXATE (METH oh TREX ate) is a chemotherapy drug used to treat cancer including breast cancer, leukemia, and lymphoma. This  medicine can also be used to treat psoriasis and certain kinds of arthritis. This medicine may be used for other purposes; ask your health care provider or pharmacist if you have questions. What should I tell my health care provider before I take this medicine? They need to know if you have any of these conditions: -fluid in the stomach area or lungs -if you often drink alcohol -infection or immune system problems -kidney disease -liver disease -low blood counts, like low white cell, platelet, or red cell counts -lung disease -radiation therapy -stomach ulcers -ulcerative colitis -an unusual or allergic reaction to methotrexate, other medicines, foods, dyes, or preservatives -pregnant or trying to get pregnant -breast-feeding How should I use this medicine? This medicine is for infusion into a vein or for injection into muscle or into the spinal fluid (whichever applies). It is usually given by a health care professional in a hospital or clinic setting. In rare cases, you might get this medicine at home. You will be taught how to give this medicine. Use exactly as directed. Take your medicine at regular intervals. Do not take your medicine more often than directed. If this medicine is used for arthritis or psoriasis, it should be taken weekly, NOT daily. It is important that you put your used needles and syringes in a special sharps container. Do not put them in a trash can. If you do not have a sharps container, call your pharmacist or healthcare provider to get one. Talk to your pediatrician regarding the use of this medicine in children. While this drug may be prescribed for children as young as 2 years for selected conditions, precautions do apply. Overdosage: If you think you have taken too much of this medicine contact a poison control center or emergency room at once. NOTE: This medicine is only for you. Do not share this medicine with others. What if I miss a dose? It is important  not to miss your dose. Call your doctor or health care professional if you are unable to keep an appointment. If you give yourself the medicine and you miss a dose, talk with your doctor or health care professional. Do not take double or extra doses. What may interact with this medicine? This medicine may interact with the following medications: -acitretin -aspirin or aspirin-like medicines including salicylates -azathioprine -certain antibiotics like chloramphenicol, penicillin, tetracycline -certain medicines for stomach problems like esomeprazole, omeprazole, pantoprazole -cyclosporine -gold -hydroxychloroquine -live virus vaccines -mercaptopurine -NSAIDs, medicines for pain and inflammation, like ibuprofen or naproxen -other cytotoxic agents -penicillamine -phenylbutazone -phenytoin -probenacid -retinoids such as isotretinoin and tretinoin -steroid medicines like prednisone or cortisone -sulfonamides like sulfasalazine and trimethoprim/sulfamethoxazole -theophylline This list may not describe all possible interactions. Give your health care provider a list of all the medicines, herbs, non-prescription drugs, or dietary supplements you use. Also tell them if you smoke, drink alcohol, or use illegal drugs. Some items may interact with your medicine. What should I watch for while using this medicine? Avoid alcoholic drinks. In some cases, you may be given additional medicines to help with side effects. Follow all directions for their use. This medicine can make you more sensitive to the sun. Keep out of the sun. If you cannot avoid being in the sun, wear protective  clothing and use sunscreen. Do not use sun lamps or tanning beds/booths. You may get drowsy or dizzy. Do not drive, use machinery, or do anything that needs mental alertness until you know how this medicine affects you. Do not stand or sit up quickly, especially if you are an older patient. This reduces the risk of dizzy or  fainting spells. You may need blood work done while you are taking this medicine. Call your doctor or health care professional for advice if you get a fever, chills or sore throat, or other symptoms of a cold or flu. Do not treat yourself. This drug decreases your body's ability to fight infections. Try to avoid being around people who are sick. This medicine may increase your risk to bruise or bleed. Call your doctor or health care professional if you notice any unusual bleeding. Check with your doctor or health care professional if you get an attack of severe diarrhea, nausea and vomiting, or if you sweat a lot. The loss of too much body fluid can make it dangerous for you to take this medicine. Talk to your doctor about your risk of cancer. You may be more at risk for certain types of cancers if you take this medicine. Both men and women must use effective birth control with this medicine. Do not become pregnant while taking this medicine or until at least 1 normal menstrual cycle has occurred after stopping it. Women should inform their doctor if they wish to become pregnant or think they might be pregnant. Men should not father a child while taking this medicine and for 3 months after stopping it. There is a potential for serious side effects to an unborn child. Talk to your health care professional or pharmacist for more information. Do not breast-feed an infant while taking this medicine. What side effects may I notice from receiving this medicine? Side effects that you should report to your doctor or health care professional as soon as possible: -allergic reactions like skin rash, itching or hives, swelling of the face, lips, or tongue -back pain -breathing problems or shortness of breath -confusion -diarrhea -dry, nonproductive cough -low blood counts - this medicine may decrease the number of white blood cells, red blood cells and platelets. You may be at increased risk of infections and  bleeding -mouth sores -redness, blistering, peeling or loosening of the skin, including inside the mouth -seizures -severe headaches -signs of infection - fever or chills, cough, sore throat, pain or difficulty passing urine -signs and symptoms of bleeding such as bloody or black, tarry stools; red or dark-brown urine; spitting up blood or brown material that looks like coffee grounds; red spots on the skin; unusual bruising or bleeding from the eye, gums, or nose -signs and symptoms of kidney injury like trouble passing urine or change in the amount of urine -signs and symptoms of liver injury like dark yellow or brown urine; general ill feeling or flu-like symptoms; light-colored stools; loss of appetite; nausea; right upper belly pain; unusually weak or tired; yellowing of the eyes or skin -stiff neck -vomiting Side effects that usually do not require medical attention (report to your doctor or health care professional if they continue or are bothersome): -dizziness -hair loss -headache -stomach pain -upset stomach This list may not describe all possible side effects. Call your doctor for medical advice about side effects. You may report side effects to FDA at 1-800-FDA-1088. Where should I keep my medicine? If you are using this medicine at home,  you will be instructed on how to store this medicine. Throw away any unused medicine after the expiration date on the label. NOTE: This sheet is a summary. It may not cover all possible information. If you have questions about this medicine, talk to your doctor, pharmacist, or health care provider.  2018 Elsevier/Gold Standard (2014-06-05 12:36:41)   Cyclophosphamide injection What is this medicine? CYCLOPHOSPHAMIDE (sye kloe FOSS fa mide) is a chemotherapy drug. It slows the growth of cancer cells. This medicine is used to treat many types of cancer like lymphoma, myeloma, leukemia, breast cancer, and ovarian cancer, to name a few. This  medicine may be used for other purposes; ask your health care provider or pharmacist if you have questions. COMMON BRAND NAME(S): Cytoxan, Neosar What should I tell my health care provider before I take this medicine? They need to know if you have any of these conditions: -blood disorders -history of other chemotherapy -infection -kidney disease -liver disease -recent or ongoing radiation therapy -tumors in the bone marrow -an unusual or allergic reaction to cyclophosphamide, other chemotherapy, other medicines, foods, dyes, or preservatives -pregnant or trying to get pregnant -breast-feeding How should I use this medicine? This drug is usually given as an injection into a vein or muscle or by infusion into a vein. It is administered in a hospital or clinic by a specially trained health care professional. Talk to your pediatrician regarding the use of this medicine in children. Special care may be needed. Overdosage: If you think you have taken too much of this medicine contact a poison control center or emergency room at once. NOTE: This medicine is only for you. Do not share this medicine with others. What if I miss a dose? It is important not to miss your dose. Call your doctor or health care professional if you are unable to keep an appointment. What may interact with this medicine? This medicine may interact with the following medications: -amiodarone -amphotericin B -azathioprine -certain antiviral medicines for HIV or AIDS such as protease inhibitors (e.g., indinavir, ritonavir) and zidovudine -certain blood pressure medications such as benazepril, captopril, enalapril, fosinopril, lisinopril, moexipril, monopril, perindopril, quinapril, ramipril, trandolapril -certain cancer medications such as anthracyclines (e.g., daunorubicin, doxorubicin), busulfan, cytarabine, paclitaxel, pentostatin, tamoxifen, trastuzumab -certain diuretics such as chlorothiazide, chlorthalidone,  hydrochlorothiazide, indapamide, metolazone -certain medicines that treat or prevent blood clots like warfarin -certain muscle relaxants such as succinylcholine -cyclosporine -etanercept -indomethacin -medicines to increase blood counts like filgrastim, pegfilgrastim, sargramostim -medicines used as general anesthesia -metronidazole -natalizumab This list may not describe all possible interactions. Give your health care provider a list of all the medicines, herbs, non-prescription drugs, or dietary supplements you use. Also tell them if you smoke, drink alcohol, or use illegal drugs. Some items may interact with your medicine. What should I watch for while using this medicine? Visit your doctor for checks on your progress. This drug may make you feel generally unwell. This is not uncommon, as chemotherapy can affect healthy cells as well as cancer cells. Report any side effects. Continue your course of treatment even though you feel ill unless your doctor tells you to stop. Drink water or other fluids as directed. Urinate often, even at night. In some cases, you may be given additional medicines to help with side effects. Follow all directions for their use. Call your doctor or health care professional for advice if you get a fever, chills or sore throat, or other symptoms of a cold or flu. Do not treat yourself. This  drug decreases your body's ability to fight infections. Try to avoid being around people who are sick. This medicine may increase your risk to bruise or bleed. Call your doctor or health care professional if you notice any unusual bleeding. Be careful brushing and flossing your teeth or using a toothpick because you may get an infection or bleed more easily. If you have any dental work done, tell your dentist you are receiving this medicine. You may get drowsy or dizzy. Do not drive, use machinery, or do anything that needs mental alertness until you know how this medicine affects  you. Do not become pregnant while taking this medicine or for 1 year after stopping it. Women should inform their doctor if they wish to become pregnant or think they might be pregnant. Men should not father a child while taking this medicine and for 4 months after stopping it. There is a potential for serious side effects to an unborn child. Talk to your health care professional or pharmacist for more information. Do not breast-feed an infant while taking this medicine. This medicine may interfere with the ability to have a child. This medicine has caused ovarian failure in some women. This medicine has caused reduced sperm counts in some men. You should talk with your doctor or health care professional if you are concerned about your fertility. If you are going to have surgery, tell your doctor or health care professional that you have taken this medicine. What side effects may I notice from receiving this medicine? Side effects that you should report to your doctor or health care professional as soon as possible: -allergic reactions like skin rash, itching or hives, swelling of the face, lips, or tongue -low blood counts - this medicine may decrease the number of white blood cells, red blood cells and platelets. You may be at increased risk for infections and bleeding. -signs of infection - fever or chills, cough, sore throat, pain or difficulty passing urine -signs of decreased platelets or bleeding - bruising, pinpoint red spots on the skin, black, tarry stools, blood in the urine -signs of decreased red blood cells - unusually weak or tired, fainting spells, lightheadedness -breathing problems -dark urine -dizziness -palpitations -swelling of the ankles, feet, hands -trouble passing urine or change in the amount of urine -weight gain -yellowing of the eyes or skin Side effects that usually do not require medical attention (report to your doctor or health care professional if they continue or  are bothersome): -changes in nail or skin color -hair loss -missed menstrual periods -mouth sores -nausea, vomiting This list may not describe all possible side effects. Call your doctor for medical advice about side effects. You may report side effects to FDA at 1-800-FDA-1088. Where should I keep my medicine? This drug is given in a hospital or clinic and will not be stored at home. NOTE: This sheet is a summary. It may not cover all possible information. If you have questions about this medicine, talk to your doctor, pharmacist, or health care provider.  2018 [employer]/Gold Standard (2011-12-30 16:22:58)

## 2017-03-29 ENCOUNTER — Telehealth: Payer: Self-pay | Admitting: *Deleted

## 2017-03-29 NOTE — Telephone Encounter (Signed)
-----   Message from Shelda Altes, RN sent at 03/28/2017 11:51 AM EST ----- Regarding: 1st time F/U phone call Pt received Cytoxan, methotrexate and 5FU. Pt is followed by Dr. [name]

## 2017-03-29 NOTE — Telephone Encounter (Signed)
TCT patient for follow up s/p 1st time Cytoxan, Methtrexate and  5FU on 03/28/17.  Spoke with patient. She states she feels better than she expected. She had 1 episode of nausea which was relieved with her antiemetic.  No other c/o. She is aware of her upcoming appts.

## 2017-03-31 ENCOUNTER — Other Ambulatory Visit: Payer: BC Managed Care – PPO

## 2017-03-31 ENCOUNTER — Ambulatory Visit: Payer: BC Managed Care – PPO | Admitting: Oncology

## 2017-04-03 NOTE — Progress Notes (Signed)
[place]  [phone] Fax:([phone]     ID: Brooke [name] DOB: Sep 21, 1950  MR#: [id number]  RDE#:[id number]  Patient Care Team: [name], DO as PCP - General (Family Medicine) Phineas Real, Belinda Block, MD (Obstetrics and Gynecology) Rolm Bookbinder, MD as Attending Physician (Dermatology) Yolette Hastings, Virgie Dad, MD as Consulting Physician (Oncology) Kyung Rudd, MD as Consulting Physician (Radiation Oncology) Jovita Kussmaul, MD as Consulting Physician (General Surgery) OTHER MD:  CHIEF COMPLAINT: Estrogen receptor positive breast cancer  CURRENT TREATMENT: Neoadjuvant chemotherapy [CMF]  HISTORY OF CURRENT ILLNESS: From the original intake note:  Brooke [name] herself palpated a change in her left breast and brought this to medical attention.  On 01/18/2017 she underwent bilateral diagnostic mammography with tomography and bilateral breast ultrasonography at [facility].  The breast density was category B.  At the most recent exam was from December 2016.  In the right breast upper outer quadrant there was a 1 cm irregular mass associated with punctate calcification.  By ultrasound this was not well-defined, and it was biopsied on 01/26/2017 with tomographic guidance this showed a complex sclerosing lesion with the usual ductal hyperplasia.  The right axilla was sonographically  On the left mammography showed a 4 cm dense mass with indistinct margins in the upper inner quadrant.  Ultrasound defined a 3 cm irregular mass in the upper inner quadrant of the left breast which was palpable.  There was an abnormal lymph node in the left axilla.  Biopsy of the left mass and abnormal lymph node ([id number], on 01/26/2017) showed both to be involved by invasive ductal carcinoma, with some lobular features but E-cadherin positive, estrogen and progesterone receptors both 100% positive, with strong staining intensity, with MIB-1 between 15-20%.  There was no HER-2 amplification, the  signals ratio being 1.31/1.58 and the number per cell 2.30/2.05  The patient's subsequent history is as detailed below.  Brooke [name] returns today for follow up and treatment of her estrogen receptor positive breast cancer.  Today is day 9 cycle 1 of 7 planned cycles of CMF, with her next CMF treatment on 04/18/2017. She notes that her treatment went well. She notes that her pain felt better. She continues to have issues with constipation.  Of course this was her second cycle of chemotherapy overall, since she received an initial cycle of cyclophosphamide and docetaxel.  REVIEW OF SYSTEMS: Brooke [name] reports that her constipation has been about the same since both of her treatments. She notes that she took a compazine after her last treatment, and she has not had nausea since then. She notes that she is afraid to eat solid food, but she is drinking large amounts of water. She has started drinking warm water, she takes a Colace. She notes that she was cramping and bloated. She notes that she has a decrease appetite. She notes that some of her bowel movements were painful. She notes that she experienced burping on the 7th day after treatment, and this was unusual for her. She notes that she had a bowel movement, and it was normal and much better. She notes that her energy level has been low. She denies having oral sores. She denies unusual headaches, visual changes, nausea, vomiting, or dizziness. There has been no unusual cough, phlegm production, or pleurisy. This been no change in bowel or bladder habits. She denies unexplained fatigue or unexplained weight loss, bleeding, rash, or fever. A detailed review of systems was otherwise stable.  PAST MEDICAL HISTORY: Past Medical History:  Diagnosis Date  . Anemia   . Anxiety   . Arthritis   . Bilateral dry eyes   . Cancer ([place])   . Family history of breast cancer 02/02/2017  . Family history of colon cancer 02/02/2017  . Family history of  prostate cancer in father 02/02/2017  . Fibroid   . History of kidney stones   . Hyperlipidemia   . Kidney stones   . Melanoma of upper arm ([place]) 2009   RIGHT ARM ; left breast cancer, squamous cell carcinoma of the skin  . Obesity (BMI 30.0-34.9)   . Osteopenia 05/2015   T score -1.3 FRAX 6%/0.5% stable from prior DEXA  . PONV (postoperative nausea and vomiting)   . UTI (urinary tract infection)   . Varicose veins of both lower extremities   . Vitamin D deficiency 07/2009   LOW VITAMIN D 29  . Vitamin deficiency 07/2008   VITAMIN D LOW 23    PAST SURGICAL HISTORY: Past Surgical History:  Procedure Laterality Date  . ABDOMINAL SURGERY  1975   ovairan cyst   . APPENDECTOMY    . BREAST SURGERY     bio  . CHOLECYSTECTOMY    . DERMOID CYST  EXCISION    . DILATION AND CURETTAGE OF UTERUS  1998  . GALLBLADDER SURGERY    . HYSTEROSCOPY    . insertion  port a cath  03/02/2016  . PORTACATH PLACEMENT N/A 03/02/2017   Procedure: INSERTION PORT-A-CATH;  Surgeon: Jovita Kussmaul, MD;  Location: [place];  Service: General;  Laterality: N/A;  . SKIN BIOPSY    . SKIN CANCER EXCISION  2010,2011   2010-RIGHT ARM, 2011-LEFT LOWER LEG.-DR Rolm Bookbinder DERM. DR. Sarajane Jews IS HER DERM SURGEON.    FAMILY HISTORY Family History  Problem Relation Age of Onset  . Diabetes Father   . Hypertension Father   . Heart disease Father   . Prostate cancer Father 43  . Breast cancer Sister 71       again at age 10, GT results unk  . Skin cancer Sister   . Colon cancer Paternal Grandfather 56  . Hypertension Mother   . Heart failure Mother   The patient's father was diagnosed with prostate cancer in his 31s and died at age 68 from complications of that disease.  The patient's mother died at age 17 with congestive heart failure.  The patient had no brothers, 3 sisters.  One sister had breast cancer at age 44 and again at age 78.  She has been genetically tested but surprisingly the sister does not know her  genetics testing results.  In addition a paternal grandfather had colon cancer in his 67s.  There is no history of ovarian cancer in the family to the patient's knowledge.  GYNECOLOGIC HISTORY:  Patient's last menstrual period was 03/18/1997. Menarche age 55. The patient is GX P0.  She stopped having periods in 1999 and took hormone replacement approximately 5 years.  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 She is single and lives alone with no pets.   ADVANCED DIRECTIVES:    HEALTH MAINTENANCE: Social History   Tobacco Use  . Smoking status: Never Smoker  . Smokeless tobacco: Never Used  Substance Use Topics  . Alcohol use: No    Frequency: Never    Comment: rare  . Drug use: No  Colonoscopy: Never  PAP:  Bone density: 2017/osteopenia   Allergies  Allergen Reactions  . Neosporin [Neomycin-Bacitracin Zn-Polymyx] Swelling, Rash and Other (See Comments)    Blisters  . Benzalkonium Chloride Other (See Comments)    Redness  . Latex Rash    Current Outpatient Medications  Medication Sig Dispense Refill  . acetaminophen (TYLENOL) 500 MG tablet Take 500 mg by mouth every 6 (six) hours as needed for moderate pain or headache.    . alclomethasone (ACLOVATE) 0.05 % cream Apply to affected area once daily as needed for dermatitis  0  . Hyprom-Naphaz-Polysorb-Zn Sulf (CLEAR EYES COMPLETE OP) Place 1-2 drops into both eyes daily as needed (for dry eyes).    Marland Kitchen ibuprofen (ADVIL,MOTRIN) 200 MG tablet Take 200 mg by mouth every 6 (six) hours as needed for headache or moderate pain.     No current facility-administered medications for this visit.     OBJECTIVE: Middle-aged white woman in no acute distress  Vitals:   04/05/17 1139  BP: 140/65  Pulse: 80  Resp: 18  Temp: 98.7 F (37.1 C)  SpO2: 96%     Body mass index is 32.15 kg/m.   Wt Readings from Last 3  Encounters:  04/05/17 217 lb 11.2 oz (98.7 kg)  03/28/17 219 lb (99.3 kg)  03/13/17 219 lb (99.3 kg)      ECOG FS:0 - Asymptomatic  Sclerae unicteric, EOMs intact Oropharynx clear and moist No cervical or supraclavicular adenopathy Lungs no rales or rhonchi Heart regular rate and rhythm Abd soft, nontender, positive bowel sounds MSK no focal spinal tenderness, no upper extremity lymphedema Neuro: nonfocal, well oriented, appropriate affect Breasts: Deferred   LAB RESULTS:  CMP     Component Value Date/Time   NA 139 03/28/2017 0940   NA 141 03/03/2017 1358   K 4.2 03/28/2017 0940   K 3.3 (L) 03/03/2017 1358   CL 108 03/28/2017 0940   CO2 25 03/28/2017 0940   CO2 28 03/03/2017 1358   GLUCOSE 94 03/28/2017 0940   GLUCOSE 107 03/03/2017 1358   BUN 21 03/28/2017 0940   BUN 12.7 03/03/2017 1358   CREATININE 0.77 03/28/2017 0940   CREATININE 0.9 03/03/2017 1358   CALCIUM 9.1 03/28/2017 0940   CALCIUM 9.4 03/03/2017 1358   PROT 6.2 (L) 03/28/2017 0940   PROT 6.5 03/03/2017 1358   ALBUMIN 3.5 03/28/2017 0940   ALBUMIN 3.7 03/03/2017 1358   AST 12 03/28/2017 0940   AST 23 03/03/2017 1358   ALT 10 03/28/2017 0940   ALT 25 03/03/2017 1358   ALKPHOS 73 03/28/2017 0940   ALKPHOS 71 03/03/2017 1358   BILITOT 0.5 03/28/2017 0940   BILITOT 0.52 03/03/2017 1358   GFRNONAA >60 03/28/2017 0940   GFRAA >60 03/28/2017 0940    No results found for: TOTALPROTELP, ALBUMINELP, A1GS, A2GS, BETS, BETA2SER, GAMS, MSPIKE, SPEI  No results found for: KPAFRELGTCHN, LAMBDASER, [facility]  Lab Results  Component Value Date   WBC 3.7 (L) 04/05/2017   NEUTROABS 2.4 04/05/2017   HGB 13.7 03/13/2017   HCT 35.9 04/05/2017   MCV 87.7 04/05/2017   PLT 304 04/05/2017      Chemistry      Component Value Date/Time   NA 139 03/28/2017 0940   NA 141 03/03/2017 1358   K 4.2 03/28/2017 0940   K 3.3 (L) 03/03/2017 1358   CL 108 03/28/2017 0940   CO2 25 03/28/2017 0940   CO2 28  03/03/2017 1358   BUN 21 03/28/2017  0940   BUN 12.7 03/03/2017 1358   CREATININE 0.77 03/28/2017 0940   CREATININE 0.9 03/03/2017 1358      Component Value Date/Time   CALCIUM 9.1 03/28/2017 0940   CALCIUM 9.4 03/03/2017 1358   ALKPHOS 73 03/28/2017 0940   ALKPHOS 71 03/03/2017 1358   AST 12 03/28/2017 0940   AST 23 03/03/2017 1358   ALT 10 03/28/2017 0940   ALT 25 03/03/2017 1358   BILITOT 0.5 03/28/2017 0940   BILITOT 0.52 03/03/2017 1358       No results found for: LABCA2  No components found for: CXKGYJ856  No results for input(s): INR in the last 168 hours.  No results found for: LABCA2  No results found for: DJS970  No results found for: YOV785  No results found for: YIF027  No results found for: CA2729  No components found for: HGQUANT  No results found for: CEA1 / No results found for: CEA1   No results found for: AFPTUMOR  No results found for: CHROMOGRNA  No results found for: PSA1  Appointment on 04/05/2017  Component Date Value Ref Range Status  . WBC Count 04/05/2017 3.7* 3.9 - 10.3 K/uL Final  . RBC 04/05/2017 4.10  3.70 - 5.45 MIL/uL Final  . Hemoglobin 04/05/2017 12.0  11.6 - 15.9 g/dL Final  . HCT 04/05/2017 35.9  34.8 - 46.6 % Final  . MCV 04/05/2017 87.7  79.5 - 101.0 fL Final  . MCH 04/05/2017 29.3  25.1 - 34.0 pg Final  . MCHC 04/05/2017 33.4  31.5 - 36.0 g/dL Final  . RDW 04/05/2017 14.0  11.2 - 14.5 % Final  . Platelet Count 04/05/2017 304  145 - 400 K/uL Final  . Neutrophils Relative % 04/05/2017 65  % Final  . Neutro Abs 04/05/2017 2.4  1.5 - 6.5 K/uL Final  . Lymphocytes Relative 04/05/2017 21  % Final  . Lymphs Abs 04/05/2017 0.8* 0.9 - 3.3 K/uL Final  . Monocytes Relative 04/05/2017 8  % Final  . Monocytes Absolute 04/05/2017 0.3  0.1 - 0.9 K/uL Final  . Eosinophils Relative 04/05/2017 5  % Final  . Eosinophils Absolute 04/05/2017 0.2  0.0 - 0.5 K/uL Final  . Basophils Relative 04/05/2017 1  % Final  . Basophils Absolute  04/05/2017 0.0  0.0 - 0.1 K/uL Final   Performed at [facility], [address] [place], [place] [address]    (this displays the last labs from the last 3 days)  No results found for: TOTALPROTELP, ALBUMINELP, A1GS, A2GS, BETS, BETA2SER, GAMS, MSPIKE, SPEI (this displays SPEP labs)  No results found for: KPAFRELGTCHN, LAMBDASER, KAPLAMBRATIO (kappa/lambda light chains)  No results found for: HGBA, HGBA2QUANT, HGBFQUANT, HGBSQUAN (Hemoglobinopathy evaluation)   No results found for: LDH  No results found for: IRON, TIBC, IRONPCTSAT (Iron and TIBC)  No results found for: FERRITIN  Urinalysis    Component Value Date/Time   COLORURINE YELLOW 05/29/2015 [place] 05/29/2015 1113   LABSPEC 1.023 05/29/2015 1113   PHURINE 5.5 05/29/2015 1113   [place] 05/29/2015 1113   GLUCOSEU NEGATIVE 11/05/2012 1559   HGBUR NEGATIVE 05/29/2015 1113   BILIRUBINUR negative 02/10/2017 1622   KETONESUR NEGATIVE 05/29/2015 1113   PROTEINUR negative 02/10/2017 1622   PROTEINUR NEGATIVE 05/29/2015 1113   UROBILINOGEN 0.2 02/10/2017 1622   UROBILINOGEN 0.2 11/05/2012 1559   NITRITE negative 02/10/2017 1622   NITRITE NEGATIVE 05/29/2015 1113   LEUKOCYTESUR Negative 02/10/2017 1622  STUDIES: No results found.  ELIGIBLE FOR AVAILABLE RESEARCH PROTOCOL: No  ASSESSMENT: 67 y.o. [place] woman status post bilateral biopsies 01/26/2017, showing  (1) in the right breast, a complex sclerosing lesion  (2) in the left breast, a cT2 pN1 invasive ductal carcinoma (with some lobular features but E-cadherin positive), grade 2, estrogen and progesterone receptor positive, HER-2 not amplified, with an MIB-1 of 15-20%  (a) breast MRI 02/04/2017 suggests a T3 N1 tumor  (3) started neoadjuvant cyclophosphamide/docetaxel 03/03/2017, discontinued after 1 cycle with very poor tolerance  (a) started cyclophosphamide/methotrexate/fluorouracil (CMF)  03/28/2017, to be repeated for 7 cycles 21 days apart  (4) definitive surgery to follow  (5) adjuvant radiation to follow surgery  ( 6) to start antiestrogens at the completion of local treatment  PLAN: Margarett did much better with CMF, but she did have significant constipation problems.  Unfortunately she did not call us but just suffered for several days and it is only now that the constipation is clearing.  I think it is going to be due to the palonosetron.  I am eliminating that drug from the premeds and instead we are going to use Compazine and Decadron.  In addition she is going to start MiraLAX on day 1 of each cycle and she will continue it until her bowel movements normalize.  I explained that she can also take stool softeners as needed.  I have encouraged her to exercise even if it is only walking a mile a day.  I think it will make an enormous difference to her short and long-term.  Otherwise she will return on 219 for her third dose of chemotherapy, her second of CMF, and she will see me the following week to troubleshoot problems.  Her counts today incidentally were very favorable.   Caellum Mancil, Virgie Dad, MD  04/05/17 12:11 PM Medical Oncology and Hematology [facility] [address] [place], [place] [address] Tel. [phone]    Fax. [phone]   Oziel Beitler, Virgie Dad, MD  04/05/17 12:11 PM Medical Oncology and Hematology [facility] [address] [place], [place] [address] Tel. [phone]    Fax. [phone]  This document serves as a record of services personally performed by Lurline Del, MD. It was created on his behalf by Sheron Nightingale, a trained medical scribe. The creation of this record is based on the scribe's personal observations and the provider's statements to them.   I have reviewed the above documentation for accuracy and completeness, and I agree with the above.

## 2017-04-05 ENCOUNTER — Inpatient Hospital Stay: Payer: Medicare Other

## 2017-04-05 ENCOUNTER — Telehealth: Payer: Self-pay | Admitting: Oncology

## 2017-04-05 ENCOUNTER — Inpatient Hospital Stay: Payer: Medicare Other | Attending: Oncology | Admitting: Oncology

## 2017-04-05 VITALS — BP 140/65 | HR 80 | Temp 98.7°F | Resp 18 | Ht 69.0 in | Wt 217.7 lb

## 2017-04-05 DIAGNOSIS — M8580 Other specified disorders of bone density and structure, unspecified site: Secondary | ICD-10-CM | POA: Diagnosis not present

## 2017-04-05 DIAGNOSIS — Z5111 Encounter for antineoplastic chemotherapy: Secondary | ICD-10-CM | POA: Insufficient documentation

## 2017-04-05 DIAGNOSIS — E785 Hyperlipidemia, unspecified: Secondary | ICD-10-CM | POA: Insufficient documentation

## 2017-04-05 DIAGNOSIS — C50212 Malignant neoplasm of upper-inner quadrant of left female breast: Secondary | ICD-10-CM

## 2017-04-05 DIAGNOSIS — Z800 Family history of malignant neoplasm of digestive organs: Secondary | ICD-10-CM | POA: Diagnosis not present

## 2017-04-05 DIAGNOSIS — Z79899 Other long term (current) drug therapy: Secondary | ICD-10-CM | POA: Diagnosis not present

## 2017-04-05 DIAGNOSIS — Z170 Estrogen receptor positive status [ER+]: Principal | ICD-10-CM

## 2017-04-05 DIAGNOSIS — Z8042 Family history of malignant neoplasm of prostate: Secondary | ICD-10-CM | POA: Diagnosis not present

## 2017-04-05 DIAGNOSIS — C50812 Malignant neoplasm of overlapping sites of left female breast: Secondary | ICD-10-CM

## 2017-04-05 DIAGNOSIS — K5900 Constipation, unspecified: Secondary | ICD-10-CM | POA: Diagnosis not present

## 2017-04-05 LAB — CBC WITH DIFFERENTIAL (CANCER CENTER ONLY)
BASOS PCT: 1 %
Basophils Absolute: 0 10*3/uL (ref 0.0–0.1)
EOS PCT: 5 %
Eosinophils Absolute: 0.2 10*3/uL (ref 0.0–0.5)
HCT: 35.9 % (ref 34.8–46.6)
Hemoglobin: 12 g/dL (ref 11.6–15.9)
LYMPHS ABS: 0.8 10*3/uL — AB (ref 0.9–3.3)
Lymphocytes Relative: 21 %
MCH: 29.3 pg (ref 25.1–34.0)
MCHC: 33.4 g/dL (ref 31.5–36.0)
MCV: 87.7 fL (ref 79.5–101.0)
MONOS PCT: 8 %
Monocytes Absolute: 0.3 10*3/uL (ref 0.1–0.9)
NEUTROS PCT: 65 %
Neutro Abs: 2.4 10*3/uL (ref 1.5–6.5)
PLATELETS: 304 10*3/uL (ref 145–400)
RBC: 4.1 MIL/uL (ref 3.70–5.45)
RDW: 14 % (ref 11.2–14.5)
WBC: 3.7 10*3/uL — AB (ref 3.9–10.3)

## 2017-04-05 NOTE — Telephone Encounter (Signed)
Gave patient AVs and calendar of upcoming February through April appointments.  °

## 2017-04-14 ENCOUNTER — Ambulatory Visit: Payer: BC Managed Care – PPO

## 2017-04-14 ENCOUNTER — Other Ambulatory Visit: Payer: BC Managed Care – PPO

## 2017-04-14 ENCOUNTER — Ambulatory Visit: Payer: BC Managed Care – PPO | Admitting: Adult Health

## 2017-04-18 ENCOUNTER — Inpatient Hospital Stay: Payer: Medicare Other

## 2017-04-18 VITALS — BP 130/70 | HR 77 | Temp 98.2°F | Resp 18

## 2017-04-18 DIAGNOSIS — C50812 Malignant neoplasm of overlapping sites of left female breast: Secondary | ICD-10-CM

## 2017-04-18 DIAGNOSIS — C50212 Malignant neoplasm of upper-inner quadrant of left female breast: Secondary | ICD-10-CM | POA: Diagnosis not present

## 2017-04-18 DIAGNOSIS — Z170 Estrogen receptor positive status [ER+]: Principal | ICD-10-CM

## 2017-04-18 DIAGNOSIS — Z95828 Presence of other vascular implants and grafts: Secondary | ICD-10-CM | POA: Insufficient documentation

## 2017-04-18 LAB — CMP (CANCER CENTER ONLY)
ALBUMIN: 3.7 g/dL (ref 3.5–5.0)
ALK PHOS: 100 U/L (ref 40–150)
ALT: 12 U/L (ref 0–55)
AST: 12 U/L (ref 5–34)
Anion gap: 9 (ref 3–11)
BILIRUBIN TOTAL: 0.6 mg/dL (ref 0.2–1.2)
BUN: 20 mg/dL (ref 7–26)
CALCIUM: 9.4 mg/dL (ref 8.4–10.4)
CO2: 24 mmol/L (ref 22–29)
Chloride: 105 mmol/L (ref 98–109)
Creatinine: 0.84 mg/dL (ref 0.60–1.10)
GFR, Est AFR Am: >60 mL/min (ref >60)
GFR, Estimated: >60 mL/min (ref >60)
GLUCOSE: 105 mg/dL (ref 70–140)
POTASSIUM: 4 mmol/L (ref 3.5–5.1)
SODIUM: 138 mmol/L (ref 136–145)
TOTAL PROTEIN: 6.5 g/dL (ref 6.4–8.3)

## 2017-04-18 LAB — CBC WITH DIFFERENTIAL (CANCER CENTER ONLY)
BASOS ABS: 0 10*3/uL (ref 0.0–0.1)
BASOS PCT: 1 %
Eosinophils Absolute: 0.1 10*3/uL (ref 0.0–0.5)
Eosinophils Relative: 3 %
HCT: 37.7 % (ref 34.8–46.6)
HEMOGLOBIN: 12.7 g/dL (ref 11.6–15.9)
Lymphocytes Relative: 17 %
Lymphs Abs: 0.7 10*3/uL — ABNORMAL LOW (ref 0.9–3.3)
MCH: 29.2 pg (ref 25.1–34.0)
MCHC: 33.7 g/dL (ref 31.5–36.0)
MCV: 86.9 fL (ref 79.5–101.0)
Monocytes Absolute: 0.5 10*3/uL (ref 0.1–0.9)
Monocytes Relative: 14 %
NEUTROS ABS: 2.6 10*3/uL (ref 1.5–6.5)
NEUTROS PCT: 65 %
Platelet Count: 254 10*3/uL (ref 145–400)
RBC: 4.34 MIL/uL (ref 3.70–5.45)
RDW: 14.4 % (ref 11.2–14.5)
WBC: 4 10*3/uL (ref 3.9–10.3)

## 2017-04-18 MED ORDER — CYCLOPHOSPHAMIDE CHEMO INJECTION 1 GM
600.0000 mg/m2 | Freq: Once | INTRAMUSCULAR | Status: AC
  Administered 2017-04-18: 1320 mg via INTRAVENOUS
  Filled 2017-04-18: qty 66

## 2017-04-18 MED ORDER — HEPARIN SOD (PORK) LOCK FLUSH 100 UNIT/ML IV SOLN
500.0000 [IU] | Freq: Once | INTRAVENOUS | Status: AC | PRN
  Administered 2017-04-18: 500 [IU]
  Filled 2017-04-18: qty 5

## 2017-04-18 MED ORDER — DEXAMETHASONE SODIUM PHOSPHATE 10 MG/ML IJ SOLN
INTRAMUSCULAR | Status: AC
  Filled 2017-04-18: qty 1

## 2017-04-18 MED ORDER — METHOTREXATE SODIUM (PF) CHEMO INJECTION 250 MG/10ML
40.0000 mg/m2 | Freq: Once | INTRAMUSCULAR | Status: AC
Start: 2017-04-18 — End: 2017-04-18
  Administered 2017-04-18: 88 mg via INTRAVENOUS
  Filled 2017-04-18: qty 3.52

## 2017-04-18 MED ORDER — DEXAMETHASONE SODIUM PHOSPHATE 10 MG/ML IJ SOLN
10.0000 mg | Freq: Once | INTRAMUSCULAR | Status: AC
  Administered 2017-04-18: 10 mg via INTRAVENOUS

## 2017-04-18 MED ORDER — PROCHLORPERAZINE EDISYLATE 5 MG/ML IJ SOLN
INTRAMUSCULAR | Status: AC
  Filled 2017-04-18: qty 2

## 2017-04-18 MED ORDER — SODIUM CHLORIDE 0.9% FLUSH
10.0000 mL | INTRAVENOUS | Status: DC | PRN
  Administered 2017-04-18: 10 mL
  Filled 2017-04-18: qty 10

## 2017-04-18 MED ORDER — SODIUM CHLORIDE 0.9 % IV SOLN: 10.0000 mg | Freq: Once | INTRAVENOUS | Status: DC

## 2017-04-18 MED ORDER — FLUOROURACIL CHEMO INJECTION 2.5 GM/50ML
600.0000 mg/m2 | Freq: Once | INTRAVENOUS | Status: AC
  Administered 2017-04-18: 1300 mg via INTRAVENOUS
  Filled 2017-04-18: qty 26

## 2017-04-18 MED ORDER — SODIUM CHLORIDE 0.9 % IV SOLN
Freq: Once | INTRAVENOUS | Status: AC
  Administered 2017-04-18: 11:00:00 via INTRAVENOUS

## 2017-04-18 MED ORDER — PROCHLORPERAZINE EDISYLATE 5 MG/ML IJ SOLN
10.0000 mg | Freq: Once | INTRAMUSCULAR | Status: AC
  Administered 2017-04-18: 10 mg via INTRAVENOUS

## 2017-04-18 NOTE — Patient Instructions (Signed)
[name]charge Instructions for Patients Receiving Chemotherapy  Today you received the following chemotherapy agents Cytoxan, Methotrexate, 5FU  To help prevent nausea and vomiting after your treatment, we encourage you to take your nausea medication as prescribed by MD.   If you develop nausea and vomiting that is not controlled by your nausea medication, call the clinic.   BELOW ARE SYMPTOMS THAT SHOULD BE REPORTED IMMEDIATELY:  *FEVER GREATER THAN 100.5 F  *CHILLS WITH OR WITHOUT FEVER  NAUSEA AND VOMITING THAT IS NOT CONTROLLED WITH YOUR NAUSEA MEDICATION  *UNUSUAL SHORTNESS OF BREATH  *UNUSUAL BRUISING OR BLEEDING  TENDERNESS IN MOUTH AND THROAT WITH OR WITHOUT PRESENCE OF ULCERS  *URINARY PROBLEMS  *BOWEL PROBLEMS  UNUSUAL RASH Items with * indicate a potential emergency and should be followed up as soon as possible.  Feel free to call the clinic should you have any questions or concerns. The clinic phone number is [phone].  Please show the [facility] at check-in to the Emergency Department and triage nurse.  Fluorouracil, 5-FU injection What is this medicine? FLUOROURACIL, 5-FU (flure oh YOOR a sil) is a chemotherapy drug. It slows the growth of cancer cells. This medicine is used to treat many types of cancer like breast cancer, colon or rectal cancer, pancreatic cancer, and stomach cancer. This medicine may be used for other purposes; ask your health care provider or pharmacist if you have questions. COMMON BRAND NAME(S): Adrucil What should I tell my health care provider before I take this medicine? They need to know if you have any of these conditions: -blood disorders -dihydropyrimidine dehydrogenase (DPD) deficiency -infection (especially a virus infection such as chickenpox, cold sores, or herpes) -kidney disease -liver disease -malnourished, poor nutrition -recent or ongoing radiation therapy -an unusual or allergic  reaction to fluorouracil, other chemotherapy, other medicines, foods, dyes, or preservatives -pregnant or trying to get pregnant -breast-feeding How should I use this medicine? This drug is given as an infusion or injection into a vein. It is administered in a hospital or clinic by a specially trained health care professional. Talk to your pediatrician regarding the use of this medicine in children. Special care may be needed. Overdosage: If you think you have taken too much of this medicine contact a poison control center or emergency room at once. NOTE: This medicine is only for you. Do not share this medicine with others. What if I miss a dose? It is important not to miss your dose. Call your doctor or health care professional if you are unable to keep an appointment. What may interact with this medicine? -allopurinol -cimetidine -dapsone -digoxin -hydroxyurea -leucovorin -levamisole -medicines for seizures like ethotoin, fosphenytoin, phenytoin -medicines to increase blood counts like filgrastim, pegfilgrastim, sargramostim -medicines that treat or prevent blood clots like warfarin, enoxaparin, and dalteparin -methotrexate -metronidazole -pyrimethamine -some other chemotherapy drugs like busulfan, cisplatin, estramustine, vinblastine -trimethoprim -trimetrexate -vaccines Talk to your doctor or health care professional before taking any of these medicines: -acetaminophen -aspirin -ibuprofen -ketoprofen -naproxen This list may not describe all possible interactions. Give your health care provider a list of all the medicines, herbs, non-prescription drugs, or dietary supplements you use. Also tell them if you smoke, drink alcohol, or use illegal drugs. Some items may interact with your medicine. What should I watch for while using this medicine? Visit your doctor for checks on your progress. This drug may make you feel generally unwell. This is not uncommon, as chemotherapy can  affect healthy cells  as well as cancer cells. Report any side effects. Continue your course of treatment even though you feel ill unless your doctor tells you to stop. In some cases, you may be given additional medicines to help with side effects. Follow all directions for their use. Call your doctor or health care professional for advice if you get a fever, chills or sore throat, or other symptoms of a cold or flu. Do not treat yourself. This drug decreases your body's ability to fight infections. Try to avoid being around people who are sick. This medicine may increase your risk to bruise or bleed. Call your doctor or health care professional if you notice any unusual bleeding. Be careful brushing and flossing your teeth or using a toothpick because you may get an infection or bleed more easily. If you have any dental work done, tell your dentist you are receiving this medicine. Avoid taking products that contain aspirin, acetaminophen, ibuprofen, naproxen, or ketoprofen unless instructed by your doctor. These medicines may hide a fever. Do not become pregnant while taking this medicine. Women should inform their doctor if they wish to become pregnant or think they might be pregnant. There is a potential for serious side effects to an unborn child. Talk to your health care professional or pharmacist for more information. Do not breast-feed an infant while taking this medicine. Men should inform their doctor if they wish to father a child. This medicine may lower sperm counts. Do not treat diarrhea with over the counter products. Contact your doctor if you have diarrhea that lasts more than 2 days or if it is severe and watery. This medicine can make you more sensitive to the sun. Keep out of the sun. If you cannot avoid being in the sun, wear protective clothing and use sunscreen. Do not use sun lamps or tanning beds/booths. What side effects may I notice from receiving this medicine? Side effects that  you should report to your doctor or health care professional as soon as possible: -allergic reactions like skin rash, itching or hives, swelling of the face, lips, or tongue -low blood counts - this medicine may decrease the number of white blood cells, red blood cells and platelets. You may be at increased risk for infections and bleeding. -signs of infection - fever or chills, cough, sore throat, pain or difficulty passing urine -signs of decreased platelets or bleeding - bruising, pinpoint red spots on the skin, black, tarry stools, blood in the urine -signs of decreased red blood cells - unusually weak or tired, fainting spells, lightheadedness -breathing problems -changes in vision -chest pain -mouth sores -nausea and vomiting -pain, swelling, redness at site where injected -pain, tingling, numbness in the hands or feet -redness, swelling, or sores on hands or feet -stomach pain -unusual bleeding Side effects that usually do not require medical attention (report to your doctor or health care professional if they continue or are bothersome): -changes in finger or toe nails -diarrhea -dry or itchy skin -hair loss -headache -loss of appetite -sensitivity of eyes to the light -stomach upset -unusually teary eyes This list may not describe all possible side effects. Call your doctor for medical advice about side effects. You may report side effects to FDA at 1-800-FDA-1088. Where should I keep my medicine? This drug is given in a hospital or clinic and will not be stored at home. NOTE: This sheet is a summary. It may not cover all possible information. If you have questions about this medicine, talk to your  doctor, pharmacist, or health care provider.  2018 [employer] Standard (2007-06-20 13:53:16)    Methotrexate injection What is this medicine? METHOTREXATE (METH oh TREX ate) is a chemotherapy drug used to treat cancer including breast cancer, leukemia, and lymphoma. This  medicine can also be used to treat psoriasis and certain kinds of arthritis. This medicine may be used for other purposes; ask your health care provider or pharmacist if you have questions. What should I tell my health care provider before I take this medicine? They need to know if you have any of these conditions: -fluid in the stomach area or lungs -if you often drink alcohol -infection or immune system problems -kidney disease -liver disease -low blood counts, like low white cell, platelet, or red cell counts -lung disease -radiation therapy -stomach ulcers -ulcerative colitis -an unusual or allergic reaction to methotrexate, other medicines, foods, dyes, or preservatives -pregnant or trying to get pregnant -breast-feeding How should I use this medicine? This medicine is for infusion into a vein or for injection into muscle or into the spinal fluid (whichever applies). It is usually given by a health care professional in a hospital or clinic setting. In rare cases, you might get this medicine at home. You will be taught how to give this medicine. Use exactly as directed. Take your medicine at regular intervals. Do not take your medicine more often than directed. If this medicine is used for arthritis or psoriasis, it should be taken weekly, NOT daily. It is important that you put your used needles and syringes in a special sharps container. Do not put them in a trash can. If you do not have a sharps container, call your pharmacist or healthcare provider to get one. Talk to your pediatrician regarding the use of this medicine in children. While this drug may be prescribed for children as young as 2 years for selected conditions, precautions do apply. Overdosage: If you think you have taken too much of this medicine contact a poison control center or emergency room at once. NOTE: This medicine is only for you. Do not share this medicine with others. What if I miss a dose? It is important  not to miss your dose. Call your doctor or health care professional if you are unable to keep an appointment. If you give yourself the medicine and you miss a dose, talk with your doctor or health care professional. Do not take double or extra doses. What may interact with this medicine? This medicine may interact with the following medications: -acitretin -aspirin or aspirin-like medicines including salicylates -azathioprine -certain antibiotics like chloramphenicol, penicillin, tetracycline -certain medicines for stomach problems like esomeprazole, omeprazole, pantoprazole -cyclosporine -gold -hydroxychloroquine -live virus vaccines -mercaptopurine -NSAIDs, medicines for pain and inflammation, like ibuprofen or naproxen -other cytotoxic agents -penicillamine -phenylbutazone -phenytoin -probenacid -retinoids such as isotretinoin and tretinoin -steroid medicines like prednisone or cortisone -sulfonamides like sulfasalazine and trimethoprim/sulfamethoxazole -theophylline This list may not describe all possible interactions. Give your health care provider a list of all the medicines, herbs, non-prescription drugs, or dietary supplements you use. Also tell them if you smoke, drink alcohol, or use illegal drugs. Some items may interact with your medicine. What should I watch for while using this medicine? Avoid alcoholic drinks. In some cases, you may be given additional medicines to help with side effects. Follow all directions for their use. This medicine can make you more sensitive to the sun. Keep out of the sun. If you cannot avoid being in the sun, wear protective  clothing and use sunscreen. Do not use sun lamps or tanning beds/booths. You may get drowsy or dizzy. Do not drive, use machinery, or do anything that needs mental alertness until you know how this medicine affects you. Do not stand or sit up quickly, especially if you are an older patient. This reduces the risk of dizzy or  fainting spells. You may need blood work done while you are taking this medicine. Call your doctor or health care professional for advice if you get a fever, chills or sore throat, or other symptoms of a cold or flu. Do not treat yourself. This drug decreases your body's ability to fight infections. Try to avoid being around people who are sick. This medicine may increase your risk to bruise or bleed. Call your doctor or health care professional if you notice any unusual bleeding. Check with your doctor or health care professional if you get an attack of severe diarrhea, nausea and vomiting, or if you sweat a lot. The loss of too much body fluid can make it dangerous for you to take this medicine. Talk to your doctor about your risk of cancer. You may be more at risk for certain types of cancers if you take this medicine. Both men and women must use effective birth control with this medicine. Do not become pregnant while taking this medicine or until at least 1 normal menstrual cycle has occurred after stopping it. Women should inform their doctor if they wish to become pregnant or think they might be pregnant. Men should not father a child while taking this medicine and for 3 months after stopping it. There is a potential for serious side effects to an unborn child. Talk to your health care professional or pharmacist for more information. Do not breast-feed an infant while taking this medicine. What side effects may I notice from receiving this medicine? Side effects that you should report to your doctor or health care professional as soon as possible: -allergic reactions like skin rash, itching or hives, swelling of the face, lips, or tongue -back pain -breathing problems or shortness of breath -confusion -diarrhea -dry, nonproductive cough -low blood counts - this medicine may decrease the number of white blood cells, red blood cells and platelets. You may be at increased risk of infections and  bleeding -mouth sores -redness, blistering, peeling or loosening of the skin, including inside the mouth -seizures -severe headaches -signs of infection - fever or chills, cough, sore throat, pain or difficulty passing urine -signs and symptoms of bleeding such as bloody or black, tarry stools; red or dark-brown urine; spitting up blood or brown material that looks like coffee grounds; red spots on the skin; unusual bruising or bleeding from the eye, gums, or nose -signs and symptoms of kidney injury like trouble passing urine or change in the amount of urine -signs and symptoms of liver injury like dark yellow or brown urine; general ill feeling or flu-like symptoms; light-colored stools; loss of appetite; nausea; right upper belly pain; unusually weak or tired; yellowing of the eyes or skin -stiff neck -vomiting Side effects that usually do not require medical attention (report to your doctor or health care professional if they continue or are bothersome): -dizziness -hair loss -headache -stomach pain -upset stomach This list may not describe all possible side effects. Call your doctor for medical advice about side effects. You may report side effects to FDA at 1-800-FDA-1088. Where should I keep my medicine? If you are using this medicine at home,  you will be instructed on how to store this medicine. Throw away any unused medicine after the expiration date on the label. NOTE: This sheet is a summary. It may not cover all possible information. If you have questions about this medicine, talk to your doctor, pharmacist, or health care provider.  2018 [employer] Standard (2014-06-05 12:36:41)   Cyclophosphamide injection What is this medicine? CYCLOPHOSPHAMIDE (sye kloe FOSS fa mide) is a chemotherapy drug. It slows the growth of cancer cells. This medicine is used to treat many types of cancer like lymphoma, myeloma, leukemia, breast cancer, and ovarian cancer, to name a few. This  medicine may be used for other purposes; ask your health care provider or pharmacist if you have questions. COMMON BRAND NAME(S): Cytoxan, Neosar What should I tell my health care provider before I take this medicine? They need to know if you have any of these conditions: -blood disorders -history of other chemotherapy -infection -kidney disease -liver disease -recent or ongoing radiation therapy -tumors in the bone marrow -an unusual or allergic reaction to cyclophosphamide, other chemotherapy, other medicines, foods, dyes, or preservatives -pregnant or trying to get pregnant -breast-feeding How should I use this medicine? This drug is usually given as an injection into a vein or muscle or by infusion into a vein. It is administered in a hospital or clinic by a specially trained health care professional. Talk to your pediatrician regarding the use of this medicine in children. Special care may be needed. Overdosage: If you think you have taken too much of this medicine contact a poison control center or emergency room at once. NOTE: This medicine is only for you. Do not share this medicine with others. What if I miss a dose? It is important not to miss your dose. Call your doctor or health care professional if you are unable to keep an appointment. What may interact with this medicine? This medicine may interact with the following medications: -amiodarone -amphotericin B -azathioprine -certain antiviral medicines for HIV or AIDS such as protease inhibitors (e.g., indinavir, ritonavir) and zidovudine -certain blood pressure medications such as benazepril, captopril, enalapril, fosinopril, lisinopril, moexipril, monopril, perindopril, quinapril, ramipril, trandolapril -certain cancer medications such as anthracyclines (e.g., daunorubicin, doxorubicin), busulfan, cytarabine, paclitaxel, pentostatin, tamoxifen, trastuzumab -certain diuretics such as chlorothiazide, chlorthalidone,  hydrochlorothiazide, indapamide, metolazone -certain medicines that treat or prevent blood clots like warfarin -certain muscle relaxants such as succinylcholine -cyclosporine -etanercept -indomethacin -medicines to increase blood counts like filgrastim, pegfilgrastim, sargramostim -medicines used as general anesthesia -metronidazole -natalizumab This list may not describe all possible interactions. Give your health care provider a list of all the medicines, herbs, non-prescription drugs, or dietary supplements you use. Also tell them if you smoke, drink alcohol, or use illegal drugs. Some items may interact with your medicine. What should I watch for while using this medicine? Visit your doctor for checks on your progress. This drug may make you feel generally unwell. This is not uncommon, as chemotherapy can affect healthy cells as well as cancer cells. Report any side effects. Continue your course of treatment even though you feel ill unless your doctor tells you to stop. Drink water or other fluids as directed. Urinate often, even at night. In some cases, you may be given additional medicines to help with side effects. Follow all directions for their use. Call your doctor or health care professional for advice if you get a fever, chills or sore throat, or other symptoms of a cold or flu. Do not treat yourself. This  drug decreases your body's ability to fight infections. Try to avoid being around people who are sick. This medicine may increase your risk to bruise or bleed. Call your doctor or health care professional if you notice any unusual bleeding. Be careful brushing and flossing your teeth or using a toothpick because you may get an infection or bleed more easily. If you have any dental work done, tell your dentist you are receiving this medicine. You may get drowsy or dizzy. Do not drive, use machinery, or do anything that needs mental alertness until you know how this medicine affects  you. Do not become pregnant while taking this medicine or for 1 year after stopping it. Women should inform their doctor if they wish to become pregnant or think they might be pregnant. Men should not father a child while taking this medicine and for 4 months after stopping it. There is a potential for serious side effects to an unborn child. Talk to your health care professional or pharmacist for more information. Do not breast-feed an infant while taking this medicine. This medicine may interfere with the ability to have a child. This medicine has caused ovarian failure in some women. This medicine has caused reduced sperm counts in some men. You should talk with your doctor or health care professional if you are concerned about your fertility. If you are going to have surgery, tell your doctor or health care professional that you have taken this medicine. What side effects may I notice from receiving this medicine? Side effects that you should report to your doctor or health care professional as soon as possible: -allergic reactions like skin rash, itching or hives, swelling of the face, lips, or tongue -low blood counts - this medicine may decrease the number of white blood cells, red blood cells and platelets. You may be at increased risk for infections and bleeding. -signs of infection - fever or chills, cough, sore throat, pain or difficulty passing urine -signs of decreased platelets or bleeding - bruising, pinpoint red spots on the skin, black, tarry stools, blood in the urine -signs of decreased red blood cells - unusually weak or tired, fainting spells, lightheadedness -breathing problems -dark urine -dizziness -palpitations -swelling of the ankles, feet, hands -trouble passing urine or change in the amount of urine -weight gain -yellowing of the eyes or skin Side effects that usually do not require medical attention (report to your doctor or health care professional if they continue or  are bothersome): -changes in nail or skin color -hair loss -missed menstrual periods -mouth sores -nausea, vomiting This list may not describe all possible side effects. Call your doctor for medical advice about side effects. You may report side effects to FDA at 1-800-FDA-1088. Where should I keep my medicine? This drug is given in a hospital or clinic and will not be stored at home. NOTE: This sheet is a summary. It may not cover all possible information. If you have questions about this medicine, talk to your doctor, pharmacist, or health care provider.  2018 [employer] Standard (2011-12-30 16:22:58)

## 2017-04-21 ENCOUNTER — Ambulatory Visit: Payer: BC Managed Care – PPO | Admitting: Oncology

## 2017-04-26 NOTE — Progress Notes (Signed)
Brooke [name]  [phone] [phone]     ID: Brooke [name] DOB: [birth year]  MR#: [id number]  JYN#:[id number]  Patient Care Team: [name], DO as PCP - General (Family Medicine) Phineas Real, Belinda Block, MD (Obstetrics and Gynecology) Rolm Bookbinder, MD as Attending Physician (Dermatology) Marykay Mccleod, Virgie Dad, MD as Consulting Physician (Oncology) Kyung Rudd, MD as Consulting Physician (Radiation Oncology) Jovita Kussmaul, MD as Consulting Physician (General Surgery) OTHER MD:  CHIEF COMPLAINT: Estrogen receptor positive breast cancer  CURRENT TREATMENT: Neoadjuvant chemotherapy [TC then CMF]  HISTORY OF CURRENT ILLNESS: From the original intake note:  Brooke [name] herself palpated a change in her left breast and brought this to medical attention.  On 01/18/2017 she underwent bilateral diagnostic mammography with tomography and bilateral breast ultrasonography at [facility].  The breast density was category B.  At the most recent exam was from December 2016.  In the right breast upper outer quadrant there was a 1 cm irregular mass associated with punctate calcification.  By ultrasound this was not well-defined, and it was biopsied on 01/26/2017 with tomographic guidance this showed a complex sclerosing lesion with the usual ductal hyperplasia.  The right axilla was sonographically  On the left mammography showed a 4 cm dense mass with indistinct margins in the upper inner quadrant.  Ultrasound defined a 3 cm irregular mass in the upper inner quadrant of the left breast which was palpable.  There was an abnormal lymph node in the left axilla.  Biopsy of the left mass and abnormal lymph node ([id number], on 01/26/2017) showed both to be involved by invasive ductal carcinoma, with some lobular features but E-cadherin positive, estrogen and progesterone receptors both 100% positive, with strong staining intensity, with MIB-1 between 15-20%.  There was no HER-2  amplification, the signals ratio being 1.31/1.58 and the number per cell 2.30/2.05  The patient's subsequent history is as detailed below.  Brooke [name] returns today for follow up and treatment of her estrogen receptor positive breast cancer. She receives CMF treatment every 21 days with a total of 7 doses planned, and her next dose due 05/09/2017.  Today is day 11 cycle 2. She reports that she is doing much better with this current treatment method. She denies nausea, other than small queasiness. She was given compazine at the cancer center, but did not need anything at home.    REVIEW OF SYSTEMS: Brooke [name] reports reports that she is feels better since the last cycle. She notes that she is eating very well and she is able to eat healthy food with some fats. She notes that she is trying to walk outside. She is interested in going to the gym, but she is wondering how this will affect her port. She notes that the nurse had some issues with access of her port. She noticed tenderness and soreness in the area of her port a couple of days after her treatment. She didn't need to take tylenol. She is also concerned about sleeping on her right side.  She denies unusual headaches, visual changes, nausea, vomiting, or dizziness. There has been no unusual cough, phlegm production, or pleurisy. This been no change in bowel or bladder habits. She denies unexplained fatigue or unexplained weight loss, bleeding, rash, or fever. A detailed review of systems was otherwise stable.     PAST MEDICAL HISTORY: Past Medical History:  Diagnosis Date  . Anemia   . Anxiety   . Arthritis   . Bilateral dry eyes   .  Cancer ([place])   . Family history of breast cancer 02/02/2017  . Family history of colon cancer 02/02/2017  . Family history of prostate cancer in father 02/02/2017  . Fibroid   . History of kidney stones   . Hyperlipidemia   . Kidney stones   . Melanoma of upper arm ([place]) 2009   RIGHT ARM ; left breast  cancer, squamous cell carcinoma of the skin  . Obesity (BMI 30.0-34.9)   . Osteopenia 05/2015   T score -1.3 FRAX 6%/0.5% stable from prior DEXA  . PONV (postoperative nausea and vomiting)   . UTI (urinary tract infection)   . Varicose veins of both lower extremities   . Vitamin D deficiency 07/2009   LOW VITAMIN D 29  . Vitamin deficiency 07/2008   VITAMIN D LOW 23    PAST SURGICAL HISTORY: Past Surgical History:  Procedure Laterality Date  . ABDOMINAL SURGERY  1975   ovairan cyst   . APPENDECTOMY    . BREAST SURGERY     bio  . CHOLECYSTECTOMY    . DERMOID CYST  EXCISION    . DILATION AND CURETTAGE OF UTERUS  1998  . GALLBLADDER SURGERY    . HYSTEROSCOPY    . insertion  port a cath  03/02/2016  . PORTACATH PLACEMENT N/A 03/02/2017   Procedure: INSERTION PORT-A-CATH;  Surgeon: Jovita Kussmaul, MD;  Location: [facility];  Service: General;  Laterality: N/A;  . SKIN BIOPSY    . SKIN CANCER EXCISION  2010,2011   2010-RIGHT ARM, 2011-LEFT LOWER LEG.-DR Rolm Bookbinder DERM. DR. Sarajane Jews IS HER DERM SURGEON.    FAMILY HISTORY Family History  Problem Relation Age of Onset  . Diabetes Father   . Hypertension Father   . Heart disease Father   . Prostate cancer Father 56  . Breast cancer Sister 36       again at age 48, GT results unk  . Skin cancer Sister   . Colon cancer Paternal Grandfather 78  . Hypertension Mother   . Heart failure Mother   The patient's father was diagnosed with prostate cancer in his 30s and died at age 54 from complications of that disease.  The patient's mother died at age 81 with congestive heart failure.  The patient had no brothers, 3 sisters.  One sister had breast cancer at age 31 and again at age 19.  She has been genetically tested and was found to be BRCA not mutated in addition a paternal grandfather had colon cancer in his 54s.  There is no history of ovarian cancer in the family to the patient's knowledge.  GYNECOLOGIC HISTORY:  Patient's last menstrual  period was 03/18/1997. Menarche age 47. The patient is GX P0.  She stopped having periods in 1999 and took hormone replacement approximately 5 years.  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 She is single and lives alone with no pets.    ADVANCED DIRECTIVES:    HEALTH MAINTENANCE: Social History   Tobacco Use  . Smoking status: Never Smoker  . Smokeless tobacco: Never Used  Substance Use Topics  . Alcohol use: No    Frequency: Never    Comment: rare  . Drug use: No     Colonoscopy: Never  PAP:  Bone density: 2017/osteopenia   Allergies  Allergen Reactions  . Neosporin [Neomycin-Bacitracin Zn-Polymyx] Swelling, Rash and Other (See Comments)  Blisters  . Benzalkonium Chloride Other (See Comments)    Redness  . Latex Rash    Current Outpatient Medications  Medication Sig Dispense Refill  . acetaminophen (TYLENOL) 500 MG tablet Take 500 mg by mouth every 6 (six) hours as needed for moderate pain or headache.    . alclomethasone (ACLOVATE) 0.05 % cream Apply to affected area once daily as needed for dermatitis  0  . Hyprom-Naphaz-Polysorb-Zn Sulf (CLEAR EYES COMPLETE OP) Place 1-2 drops into both eyes daily as needed (for dry eyes).    Marland Kitchen ibuprofen (ADVIL,MOTRIN) 200 MG tablet Take 200 mg by mouth every 6 (six) hours as needed for headache or moderate pain.     No current facility-administered medications for this visit.     OBJECTIVE: Middle-aged white woman who appears well  Vitals:   04/28/17 0924  BP: 123/71  Pulse: 75  Resp: 18  Temp: 98.1 F (36.7 C)  SpO2: 97%     Body mass index is 31.91 kg/m.   Wt Readings from Last 3 Encounters:  04/28/17 216 lb 1.6 oz (98 kg)  04/05/17 217 lb 11.2 oz (98.7 kg)  03/28/17 219 lb (99.3 kg)      ECOG FS:0 - Asymptomatic  Sclerae unicteric, pupils round and equal Oropharynx clear and moist No  cervical or supraclavicular adenopathy Lungs no rales or rhonchi Heart regular rate and rhythm Abd soft, nontender, positive bowel sounds MSK no focal spinal tenderness, no upper extremity lymphedema Neuro: nonfocal, well oriented, appropriate affect Breasts: I do not palpate a mass in either breast.  Both axillae are benign. Skin: Port is in upper anterior right chest, intact  LAB RESULTS:  CMP     Component Value Date/Time   NA 138 04/18/2017 0922   NA 141 03/03/2017 1358   K 4.0 04/18/2017 0922   K 3.3 (L) 03/03/2017 1358   CL 105 04/18/2017 0922   CO2 24 04/18/2017 0922   CO2 28 03/03/2017 1358   GLUCOSE 105 04/18/2017 0922   GLUCOSE 107 03/03/2017 1358   BUN 20 04/18/2017 0922   BUN 12.7 03/03/2017 1358   CREATININE 0.84 04/18/2017 0922   CREATININE 0.9 03/03/2017 1358   CALCIUM 9.4 04/18/2017 0922   CALCIUM 9.4 03/03/2017 1358   PROT 6.5 04/18/2017 0922   PROT 6.5 03/03/2017 1358   ALBUMIN 3.7 04/18/2017 0922   ALBUMIN 3.7 03/03/2017 1358   AST 12 04/18/2017 0922   AST 23 03/03/2017 1358   ALT 12 04/18/2017 0922   ALT 25 03/03/2017 1358   ALKPHOS 100 04/18/2017 0922   ALKPHOS 71 03/03/2017 1358   BILITOT 0.6 04/18/2017 0922   BILITOT 0.52 03/03/2017 1358   GFRNONAA >60 04/18/2017 0922   GFRAA >60 04/18/2017 0922    No results found for: TOTALPROTELP, ALBUMINELP, A1GS, A2GS, BETS, BETA2SER, GAMS, MSPIKE, SPEI  No results found for: Nils Pyle, [facility]  Lab Results  Component Value Date   WBC 2.8 (L) 04/28/2017   NEUTROABS 1.9 04/28/2017   HGB 13.7 03/13/2017   HCT 38.3 04/28/2017   MCV 87.0 04/28/2017   PLT 237 04/28/2017      Chemistry      Component Value Date/Time   NA 138 04/18/2017 0922   NA 141 03/03/2017 1358   K 4.0 04/18/2017 0922   K 3.3 (L) 03/03/2017 1358   CL 105 04/18/2017 0922   CO2 24 04/18/2017 0922   CO2 28 03/03/2017 1358   BUN 20 04/18/2017 0922   BUN 12.7 03/03/2017  1358   CREATININE 0.84 04/18/2017  0922   CREATININE 0.9 03/03/2017 1358      Component Value Date/Time   CALCIUM 9.4 04/18/2017 0922   CALCIUM 9.4 03/03/2017 1358   ALKPHOS 100 04/18/2017 0922   ALKPHOS 71 03/03/2017 1358   AST 12 04/18/2017 0922   AST 23 03/03/2017 1358   ALT 12 04/18/2017 0922   ALT 25 03/03/2017 1358   BILITOT 0.6 04/18/2017 0922   BILITOT 0.52 03/03/2017 1358       No results found for: LABCA2  No components found for: VXBLTJ030  No results for input(s): INR in the last 168 hours.  No results found for: LABCA2  No results found for: SPQ330  No results found for: QTM226  No results found for: JFH545  No results found for: CA2729  No components found for: HGQUANT  No results found for: CEA1 / No results found for: CEA1   No results found for: AFPTUMOR  No results found for: CHROMOGRNA  No results found for: PSA1  Appointment on 04/28/2017  Component Date Value Ref Range Status  . WBC Count 04/28/2017 2.8* 3.9 - 10.3 K/uL Final  . RBC 04/28/2017 4.41  3.70 - 5.45 MIL/uL Final  . Hemoglobin 04/28/2017 12.7  11.6 - 15.9 g/dL Final  . HCT 04/28/2017 38.3  34.8 - 46.6 % Final  . MCV 04/28/2017 87.0  79.5 - 101.0 fL Final  . MCH 04/28/2017 28.8  25.1 - 34.0 pg Final  . MCHC 04/28/2017 33.1  31.5 - 36.0 g/dL Final  . RDW 04/28/2017 14.3  11.2 - 14.5 % Final  . Platelet Count 04/28/2017 237  145 - 400 K/uL Final  . Neutrophils Relative % 04/28/2017 67  % Final  . Neutro Abs 04/28/2017 1.9  1.5 - 6.5 K/uL Final  . Lymphocytes Relative 04/28/2017 19  % Final  . Lymphs Abs 04/28/2017 0.5* 0.9 - 3.3 K/uL Final  . Monocytes Relative 04/28/2017 10  % Final  . Monocytes Absolute 04/28/2017 0.3  0.1 - 0.9 K/uL Final  . Eosinophils Relative 04/28/2017 3  % Final  . Eosinophils Absolute 04/28/2017 0.1  0.0 - 0.5 K/uL Final  . Basophils Relative 04/28/2017 1  % Final  . Basophils Absolute 04/28/2017 0.0  0.0 - 0.1 K/uL Final   Performed at [facility], [place]  [address] [place], [place] [address]    (this displays the last labs from the last 3 days)  No results found for: TOTALPROTELP, ALBUMINELP, A1GS, A2GS, BETS, BETA2SER, GAMS, MSPIKE, SPEI (this displays SPEP labs)  No results found for: KPAFRELGTCHN, LAMBDASER, KAPLAMBRATIO (kappa/lambda light chains)  No results found for: HGBA, HGBA2QUANT, HGBFQUANT, HGBSQUAN (Hemoglobinopathy evaluation)   No results found for: LDH  No results found for: IRON, TIBC, IRONPCTSAT (Iron and TIBC)  No results found for: FERRITIN  Urinalysis    Component Value Date/Time   COLORURINE YELLOW 05/29/2015 [place] 05/29/2015 1113   LABSPEC 1.023 05/29/2015 1113   PHURINE 5.5 05/29/2015 1113   GLUCOSEU NEGATIVE 05/29/2015 1113   GLUCOSEU NEGATIVE 11/05/2012 1559   HGBUR NEGATIVE 05/29/2015 1113   BILIRUBINUR negative 02/10/2017 1622   KETONESUR NEGATIVE 05/29/2015 1113   PROTEINUR negative 02/10/2017 1622   PROTEINUR NEGATIVE 05/29/2015 1113   UROBILINOGEN 0.2 02/10/2017 1622   UROBILINOGEN 0.2 11/05/2012 1559   NITRITE negative 02/10/2017 1622   NITRITE NEGATIVE 05/29/2015 1113   LEUKOCYTESUR Negative 02/10/2017 1622     STUDIES: No results found.  ELIGIBLE FOR AVAILABLE RESEARCH PROTOCOL: No  ASSESSMENT: 67 y.o. [place] woman status post bilateral biopsies 01/26/2017, showing  (1) in the right breast, a complex sclerosing lesion  (2) in the left breast, a cT2 pN1 invasive ductal carcinoma (with some lobular features but E-cadherin positive), grade 2, estrogen and progesterone receptor positive, HER-2 not amplified, with an MIB-1 of 15-20%  (a) breast MRI 02/04/2017 suggests a T3 N1 tumor  (3) started neoadjuvant cyclophosphamide/docetaxel 03/03/2017, discontinued after 1 cycle with very poor tolerance  (a) started cyclophosphamide/methotrexate/fluorouracil (CMF) 03/28/2017, to be repeated for 7 cycles 21 days apart  (4) definitive surgery to follow  (5) adjuvant  radiation to follow surgery  ( 6) to start antiestrogens at the completion of local treatment  PLAN: Brooke [name] is finally tolerating CMF essentially with no side effects.  Getting rid of the palonosetron resolved the constipation issue without her developing any nausea.  I have alerted her to jump on any nausea at all and take Compazine since we do not want to learn to vomit.  She had concerns regarding her port and I alerted her on issues regarding swelling, erythema, or tenderness.  We also discussed how to do exercises that will not affect the port.  She is welcome to sleep on her right side if she wishes.  At this point I am comfortable not seeing her on day 8 after each cycle.  In fact I will not see her with her next cycle but I will start seeing her on day 1 of each cycle beginning with her 05/30/2017 treatment.  We discussed the fact that if she is considering reconstruction at all she should start discussing it with our plastic surgeons.       Brooke [name], Virgie Dad, MD  04/28/17 9:39 AM Medical Oncology and Hematology [facility] [address] [place], [place] [address] Tel. [phone]    Fax. [phone]  This document serves as a record of services personally performed by Lurline Del, MD. It was created on his behalf by Sheron Nightingale, a trained medical scribe. The creation of this record is based on the scribe's personal observations and the provider's statements to them.   I have reviewed the above documentation for accuracy and completeness, and I agree with the above.

## 2017-04-28 ENCOUNTER — Inpatient Hospital Stay (HOSPITAL_BASED_OUTPATIENT_CLINIC_OR_DEPARTMENT_OTHER): Payer: Medicare Other | Admitting: Oncology

## 2017-04-28 ENCOUNTER — Telehealth: Payer: Self-pay | Admitting: Oncology

## 2017-04-28 ENCOUNTER — Inpatient Hospital Stay: Payer: Medicare Other | Attending: Oncology

## 2017-04-28 VITALS — BP 123/71 | HR 75 | Temp 98.1°F | Resp 18 | Ht 69.0 in | Wt 216.1 lb

## 2017-04-28 DIAGNOSIS — C50212 Malignant neoplasm of upper-inner quadrant of left female breast: Secondary | ICD-10-CM | POA: Diagnosis not present

## 2017-04-28 DIAGNOSIS — M1990 Unspecified osteoarthritis, unspecified site: Secondary | ICD-10-CM | POA: Diagnosis not present

## 2017-04-28 DIAGNOSIS — Z79899 Other long term (current) drug therapy: Secondary | ICD-10-CM | POA: Insufficient documentation

## 2017-04-28 DIAGNOSIS — Z170 Estrogen receptor positive status [ER+]: Secondary | ICD-10-CM

## 2017-04-28 DIAGNOSIS — C50812 Malignant neoplasm of overlapping sites of left female breast: Secondary | ICD-10-CM

## 2017-04-28 DIAGNOSIS — M8580 Other specified disorders of bone density and structure, unspecified site: Secondary | ICD-10-CM | POA: Insufficient documentation

## 2017-04-28 DIAGNOSIS — Z803 Family history of malignant neoplasm of breast: Secondary | ICD-10-CM | POA: Diagnosis not present

## 2017-04-28 DIAGNOSIS — Z8042 Family history of malignant neoplasm of prostate: Secondary | ICD-10-CM | POA: Insufficient documentation

## 2017-04-28 DIAGNOSIS — E785 Hyperlipidemia, unspecified: Secondary | ICD-10-CM | POA: Diagnosis not present

## 2017-04-28 DIAGNOSIS — Z800 Family history of malignant neoplasm of digestive organs: Secondary | ICD-10-CM | POA: Diagnosis not present

## 2017-04-28 LAB — CBC WITH DIFFERENTIAL (CANCER CENTER ONLY)
Basophils Absolute: 0 10*3/uL (ref 0.0–0.1)
Basophils Relative: 1 %
EOS PCT: 3 %
Eosinophils Absolute: 0.1 10*3/uL (ref 0.0–0.5)
HCT: 38.3 % (ref 34.8–46.6)
Hemoglobin: 12.7 g/dL (ref 11.6–15.9)
LYMPHS ABS: 0.5 10*3/uL — AB (ref 0.9–3.3)
LYMPHS PCT: 19 %
MCH: 28.8 pg (ref 25.1–34.0)
MCHC: 33.1 g/dL (ref 31.5–36.0)
MCV: 87 fL (ref 79.5–101.0)
MONOS PCT: 10 %
Monocytes Absolute: 0.3 10*3/uL (ref 0.1–0.9)
Neutro Abs: 1.9 10*3/uL (ref 1.5–6.5)
Neutrophils Relative %: 67 %
Platelet Count: 237 10*3/uL (ref 145–400)
RBC: 4.41 MIL/uL (ref 3.70–5.45)
RDW: 14.3 % (ref 11.2–14.5)
WBC: 2.8 10*3/uL — AB (ref 3.9–10.3)

## 2017-04-28 NOTE — Telephone Encounter (Signed)
Gave patient AVs and calendar of upcoming April appointments. Patient scheduled per 3/1 los. Patients f/u is scheduled before lab/flush because lab is in a meeting. Provider OK with appointments as scheduled.

## 2017-05-05 ENCOUNTER — Other Ambulatory Visit: Payer: BC Managed Care – PPO

## 2017-05-05 ENCOUNTER — Ambulatory Visit: Payer: BC Managed Care – PPO | Admitting: Adult Health

## 2017-05-05 ENCOUNTER — Encounter: Payer: Self-pay | Admitting: Pharmacist

## 2017-05-05 ENCOUNTER — Ambulatory Visit: Payer: BC Managed Care – PPO

## 2017-05-08 ENCOUNTER — Ambulatory Visit (HOSPITAL_COMMUNITY): Payer: BC Managed Care – PPO | Admitting: Cardiology

## 2017-05-08 ENCOUNTER — Other Ambulatory Visit (HOSPITAL_COMMUNITY): Payer: BC Managed Care – PPO

## 2017-05-09 ENCOUNTER — Inpatient Hospital Stay: Payer: Medicare Other

## 2017-05-09 VITALS — BP 144/80 | HR 72 | Temp 97.9°F | Resp 18 | Wt 219.2 lb

## 2017-05-09 DIAGNOSIS — C50812 Malignant neoplasm of overlapping sites of left female breast: Secondary | ICD-10-CM

## 2017-05-09 DIAGNOSIS — C50212 Malignant neoplasm of upper-inner quadrant of left female breast: Secondary | ICD-10-CM | POA: Diagnosis not present

## 2017-05-09 DIAGNOSIS — Z170 Estrogen receptor positive status [ER+]: Principal | ICD-10-CM

## 2017-05-09 DIAGNOSIS — Z95828 Presence of other vascular implants and grafts: Secondary | ICD-10-CM

## 2017-05-09 LAB — CMP (CANCER CENTER ONLY)
ALT: 15 U/L (ref 0–55)
AST: 12 U/L (ref 5–34)
Albumin: 3.7 g/dL (ref 3.5–5.0)
Alkaline Phosphatase: 100 U/L (ref 40–150)
Anion gap: 7 (ref 3–11)
BILIRUBIN TOTAL: 0.6 mg/dL (ref 0.2–1.2)
BUN: 24 mg/dL (ref 7–26)
CHLORIDE: 106 mmol/L (ref 98–109)
CO2: 25 mmol/L (ref 22–29)
Calcium: 9.6 mg/dL (ref 8.4–10.4)
Creatinine: 0.95 mg/dL (ref 0.60–1.10)
GFR, Est AFR Am: >60 mL/min (ref >60)
GFR, Estimated: >60 mL/min (ref >60)
Glucose, Bld: 98 mg/dL (ref 70–140)
POTASSIUM: 4.2 mmol/L (ref 3.5–5.1)
Sodium: 138 mmol/L (ref 136–145)
TOTAL PROTEIN: 6.6 g/dL (ref 6.4–8.3)

## 2017-05-09 LAB — CBC WITH DIFFERENTIAL (CANCER CENTER ONLY)
Basophils Absolute: 0 10*3/uL (ref 0.0–0.1)
Basophils Relative: 1 %
EOS PCT: 3 %
Eosinophils Absolute: 0.1 10*3/uL (ref 0.0–0.5)
HCT: 39.1 % (ref 34.8–46.6)
Hemoglobin: 12.7 g/dL (ref 11.6–15.9)
LYMPHS ABS: 0.7 10*3/uL — AB (ref 0.9–3.3)
LYMPHS PCT: 21 %
MCH: 29 pg (ref 25.1–34.0)
MCHC: 32.5 g/dL (ref 31.5–36.0)
MCV: 89.3 fL (ref 79.5–101.0)
MONOS PCT: 21 %
Monocytes Absolute: 0.7 10*3/uL (ref 0.1–0.9)
Neutro Abs: 1.9 10*3/uL (ref 1.5–6.5)
Neutrophils Relative %: 54 %
PLATELETS: 288 10*3/uL (ref 145–400)
RBC: 4.38 MIL/uL (ref 3.70–5.45)
RDW: 14.5 % (ref 11.2–14.5)
WBC Count: 3.5 10*3/uL — ABNORMAL LOW (ref 3.9–10.3)

## 2017-05-09 MED ORDER — METHOTREXATE SODIUM (PF) CHEMO INJECTION 250 MG/10ML
40.0000 mg/m2 | Freq: Once | INTRAMUSCULAR | Status: AC
  Administered 2017-05-09: 88 mg via INTRAVENOUS
  Filled 2017-05-09: qty 3.52

## 2017-05-09 MED ORDER — SODIUM CHLORIDE 0.9 % IV SOLN
600.0000 mg/m2 | Freq: Once | INTRAVENOUS | Status: AC
  Administered 2017-05-09: 1320 mg via INTRAVENOUS
  Filled 2017-05-09: qty 66

## 2017-05-09 MED ORDER — FLUOROURACIL CHEMO INJECTION 2.5 GM/50ML
600.0000 mg/m2 | Freq: Once | INTRAVENOUS | Status: AC
  Administered 2017-05-09: 1300 mg via INTRAVENOUS
  Filled 2017-05-09: qty 26

## 2017-05-09 MED ORDER — PROCHLORPERAZINE EDISYLATE 5 MG/ML IJ SOLN
INTRAMUSCULAR | Status: AC
  Filled 2017-05-09: qty 2

## 2017-05-09 MED ORDER — DEXAMETHASONE SODIUM PHOSPHATE 10 MG/ML IJ SOLN
10.0000 mg | Freq: Once | INTRAMUSCULAR | Status: AC
  Administered 2017-05-09: 10 mg via INTRAVENOUS

## 2017-05-09 MED ORDER — PROCHLORPERAZINE EDISYLATE 5 MG/ML IJ SOLN
10.0000 mg | Freq: Once | INTRAMUSCULAR | Status: AC
  Administered 2017-05-09: 10 mg via INTRAVENOUS

## 2017-05-09 MED ORDER — SODIUM CHLORIDE 0.9 % IV SOLN
Freq: Once | INTRAVENOUS | Status: AC
Start: 2017-05-09 — End: 2017-05-09
  Administered 2017-05-09: 11:00:00 via INTRAVENOUS

## 2017-05-09 MED ORDER — SODIUM CHLORIDE 0.9% FLUSH
10.0000 mL | INTRAVENOUS | Status: DC | PRN
  Administered 2017-05-09: 10 mL
  Filled 2017-05-09: qty 10

## 2017-05-09 MED ORDER — DEXAMETHASONE SODIUM PHOSPHATE 10 MG/ML IJ SOLN
INTRAMUSCULAR | Status: AC
  Filled 2017-05-09: qty 1

## 2017-05-09 MED ORDER — HEPARIN SOD (PORK) LOCK FLUSH 100 UNIT/ML IV SOLN
500.0000 [IU] | Freq: Once | INTRAVENOUS | Status: AC | PRN
  Administered 2017-05-09: 500 [IU]
  Filled 2017-05-09: qty 5

## 2017-05-09 NOTE — Patient Instructions (Signed)
[facility] Discharge Instructions for Patients Receiving Chemotherapy  Today you received the following chemotherapy agents: Cytoxan, Methotrexate, 5FU.  To help prevent nausea and vomiting after your treatment, we encourage you to take your nausea medication as prescribed by MD.   If you develop nausea and vomiting that is not controlled by your nausea medication, call the clinic.   BELOW ARE SYMPTOMS THAT SHOULD BE REPORTED IMMEDIATELY:  *FEVER GREATER THAN 100.5 F  *CHILLS WITH OR WITHOUT FEVER  NAUSEA AND VOMITING THAT IS NOT CONTROLLED WITH YOUR NAUSEA MEDICATION  *UNUSUAL SHORTNESS OF BREATH  *UNUSUAL BRUISING OR BLEEDING  TENDERNESS IN MOUTH AND THROAT WITH OR WITHOUT PRESENCE OF ULCERS  *URINARY PROBLEMS  *BOWEL PROBLEMS  UNUSUAL RASH Items with * indicate a potential emergency and should be followed up as soon as possible.  Feel free to call the clinic should you have any questions or concerns. The clinic phone number is [phone].  Please show the [facility] at check-in to the Emergency Department and triage nurse.  Fluorouracil, 5-FU injection What is this medicine? FLUOROURACIL, 5-FU (flure oh YOOR a sil) is a chemotherapy drug. It slows the growth of cancer cells. This medicine is used to treat many types of cancer like breast cancer, colon or rectal cancer, pancreatic cancer, and stomach cancer. This medicine may be used for other purposes; ask your health care provider or pharmacist if you have questions. COMMON BRAND NAME(S): Adrucil What should I tell my health care provider before I take this medicine? They need to know if you have any of these conditions: -blood disorders -dihydropyrimidine dehydrogenase (DPD) deficiency -infection (especially a virus infection such as chickenpox, cold sores, or herpes) -kidney disease -liver disease -malnourished, poor nutrition -recent or ongoing radiation therapy -an unusual or allergic  reaction to fluorouracil, other chemotherapy, other medicines, foods, dyes, or preservatives -pregnant or trying to get pregnant -breast-feeding How should I use this medicine? This drug is given as an infusion or injection into a vein. It is administered in a hospital or clinic by a specially trained health care professional. Talk to your pediatrician regarding the use of this medicine in children. Special care may be needed. Overdosage: If you think you have taken too much of this medicine contact a poison control center or emergency room at once. NOTE: This medicine is only for you. Do not share this medicine with others. What if I miss a dose? It is important not to miss your dose. Call your doctor or health care professional if you are unable to keep an appointment. What may interact with this medicine? -allopurinol -cimetidine -dapsone -digoxin -hydroxyurea -leucovorin -levamisole -medicines for seizures like ethotoin, fosphenytoin, phenytoin -medicines to increase blood counts like filgrastim, pegfilgrastim, sargramostim -medicines that treat or prevent blood clots like warfarin, enoxaparin, and dalteparin -methotrexate -metronidazole -pyrimethamine -some other chemotherapy drugs like busulfan, cisplatin, estramustine, vinblastine -trimethoprim -trimetrexate -vaccines Talk to your doctor or health care professional before taking any of these medicines: -acetaminophen -aspirin -ibuprofen -ketoprofen -naproxen This list may not describe all possible interactions. Give your health care provider a list of all the medicines, herbs, non-prescription drugs, or dietary supplements you use. Also tell them if you smoke, drink alcohol, or use illegal drugs. Some items may interact with your medicine. What should I watch for while using this medicine? Visit your doctor for checks on your progress. This drug may make you feel generally unwell. This is not uncommon, as chemotherapy can  affect healthy cells  as well as cancer cells. Report any side effects. Continue your course of treatment even though you feel ill unless your doctor tells you to stop. In some cases, you may be given additional medicines to help with side effects. Follow all directions for their use. Call your doctor or health care professional for advice if you get a fever, chills or sore throat, or other symptoms of a cold or flu. Do not treat yourself. This drug decreases your body's ability to fight infections. Try to avoid being around people who are sick. This medicine may increase your risk to bruise or bleed. Call your doctor or health care professional if you notice any unusual bleeding. Be careful brushing and flossing your teeth or using a toothpick because you may get an infection or bleed more easily. If you have any dental work done, tell your dentist you are receiving this medicine. Avoid taking products that contain aspirin, acetaminophen, ibuprofen, naproxen, or ketoprofen unless instructed by your doctor. These medicines may hide a fever. Do not become pregnant while taking this medicine. Women should inform their doctor if they wish to become pregnant or think they might be pregnant. There is a potential for serious side effects to an unborn child. Talk to your health care professional or pharmacist for more information. Do not breast-feed an infant while taking this medicine. Men should inform their doctor if they wish to father a child. This medicine may lower sperm counts. Do not treat diarrhea with over the counter products. Contact your doctor if you have diarrhea that lasts more than 2 days or if it is severe and watery. This medicine can make you more sensitive to the sun. Keep out of the sun. If you cannot avoid being in the sun, wear protective clothing and use sunscreen. Do not use sun lamps or tanning beds/booths. What side effects may I notice from receiving this medicine? Side effects that  you should report to your doctor or health care professional as soon as possible: -allergic reactions like skin rash, itching or hives, swelling of the face, lips, or tongue -low blood counts - this medicine may decrease the number of white blood cells, red blood cells and platelets. You may be at increased risk for infections and bleeding. -signs of infection - fever or chills, cough, sore throat, pain or difficulty passing urine -signs of decreased platelets or bleeding - bruising, pinpoint red spots on the skin, black, tarry stools, blood in the urine -signs of decreased red blood cells - unusually weak or tired, fainting spells, lightheadedness -breathing problems -changes in vision -chest pain -mouth sores -nausea and vomiting -pain, swelling, redness at site where injected -pain, tingling, numbness in the hands or feet -redness, swelling, or sores on hands or feet -stomach pain -unusual bleeding Side effects that usually do not require medical attention (report to your doctor or health care professional if they continue or are bothersome): -changes in finger or toe nails -diarrhea -dry or itchy skin -hair loss -headache -loss of appetite -sensitivity of eyes to the light -stomach upset -unusually teary eyes This list may not describe all possible side effects. Call your doctor for medical advice about side effects. You may report side effects to FDA at 1-800-FDA-1088. Where should I keep my medicine? This drug is given in a hospital or clinic and will not be stored at home. NOTE: This sheet is a summary. It may not cover all possible information. If you have questions about this medicine, talk to your  doctor, pharmacist, or health care provider.  2018 [employer] Standard (2007-06-20 13:53:16)    Methotrexate injection What is this medicine? METHOTREXATE (METH oh TREX ate) is a chemotherapy drug used to treat cancer including breast cancer, leukemia, and lymphoma. This  medicine can also be used to treat psoriasis and certain kinds of arthritis. This medicine may be used for other purposes; ask your health care provider or pharmacist if you have questions. What should I tell my health care provider before I take this medicine? They need to know if you have any of these conditions: -fluid in the stomach area or lungs -if you often drink alcohol -infection or immune system problems -kidney disease -liver disease -low blood counts, like low white cell, platelet, or red cell counts -lung disease -radiation therapy -stomach ulcers -ulcerative colitis -an unusual or allergic reaction to methotrexate, other medicines, foods, dyes, or preservatives -pregnant or trying to get pregnant -breast-feeding How should I use this medicine? This medicine is for infusion into a vein or for injection into muscle or into the spinal fluid (whichever applies). It is usually given by a health care professional in a hospital or clinic setting. In rare cases, you might get this medicine at home. You will be taught how to give this medicine. Use exactly as directed. Take your medicine at regular intervals. Do not take your medicine more often than directed. If this medicine is used for arthritis or psoriasis, it should be taken weekly, NOT daily. It is important that you put your used needles and syringes in a special sharps container. Do not put them in a trash can. If you do not have a sharps container, call your pharmacist or healthcare provider to get one. Talk to your pediatrician regarding the use of this medicine in children. While this drug may be prescribed for children as young as 2 years for selected conditions, precautions do apply. Overdosage: If you think you have taken too much of this medicine contact a poison control center or emergency room at once. NOTE: This medicine is only for you. Do not share this medicine with others. What if I miss a dose? It is important  not to miss your dose. Call your doctor or health care professional if you are unable to keep an appointment. If you give yourself the medicine and you miss a dose, talk with your doctor or health care professional. Do not take double or extra doses. What may interact with this medicine? This medicine may interact with the following medications: -acitretin -aspirin or aspirin-like medicines including salicylates -azathioprine -certain antibiotics like chloramphenicol, penicillin, tetracycline -certain medicines for stomach problems like esomeprazole, omeprazole, pantoprazole -cyclosporine -gold -hydroxychloroquine -live virus vaccines -mercaptopurine -NSAIDs, medicines for pain and inflammation, like ibuprofen or naproxen -other cytotoxic agents -penicillamine -phenylbutazone -phenytoin -probenacid -retinoids such as isotretinoin and tretinoin -steroid medicines like prednisone or cortisone -sulfonamides like sulfasalazine and trimethoprim/sulfamethoxazole -theophylline This list may not describe all possible interactions. Give your health care provider a list of all the medicines, herbs, non-prescription drugs, or dietary supplements you use. Also tell them if you smoke, drink alcohol, or use illegal drugs. Some items may interact with your medicine. What should I watch for while using this medicine? Avoid alcoholic drinks. In some cases, you may be given additional medicines to help with side effects. Follow all directions for their use. This medicine can make you more sensitive to the sun. Keep out of the sun. If you cannot avoid being in the sun, wear protective  clothing and use sunscreen. Do not use sun lamps or tanning beds/booths. You may get drowsy or dizzy. Do not drive, use machinery, or do anything that needs mental alertness until you know how this medicine affects you. Do not stand or sit up quickly, especially if you are an older patient. This reduces the risk of dizzy or  fainting spells. You may need blood work done while you are taking this medicine. Call your doctor or health care professional for advice if you get a fever, chills or sore throat, or other symptoms of a cold or flu. Do not treat yourself. This drug decreases your body's ability to fight infections. Try to avoid being around people who are sick. This medicine may increase your risk to bruise or bleed. Call your doctor or health care professional if you notice any unusual bleeding. Check with your doctor or health care professional if you get an attack of severe diarrhea, nausea and vomiting, or if you sweat a lot. The loss of too much body fluid can make it dangerous for you to take this medicine. Talk to your doctor about your risk of cancer. You may be more at risk for certain types of cancers if you take this medicine. Both men and women must use effective birth control with this medicine. Do not become pregnant while taking this medicine or until at least 1 normal menstrual cycle has occurred after stopping it. Women should inform their doctor if they wish to become pregnant or think they might be pregnant. Men should not father a child while taking this medicine and for 3 months after stopping it. There is a potential for serious side effects to an unborn child. Talk to your health care professional or pharmacist for more information. Do not breast-feed an infant while taking this medicine. What side effects may I notice from receiving this medicine? Side effects that you should report to your doctor or health care professional as soon as possible: -allergic reactions like skin rash, itching or hives, swelling of the face, lips, or tongue -back pain -breathing problems or shortness of breath -confusion -diarrhea -dry, nonproductive cough -low blood counts - this medicine may decrease the number of white blood cells, red blood cells and platelets. You may be at increased risk of infections and  bleeding -mouth sores -redness, blistering, peeling or loosening of the skin, including inside the mouth -seizures -severe headaches -signs of infection - fever or chills, cough, sore throat, pain or difficulty passing urine -signs and symptoms of bleeding such as bloody or black, tarry stools; red or dark-brown urine; spitting up blood or brown material that looks like coffee grounds; red spots on the skin; unusual bruising or bleeding from the eye, gums, or nose -signs and symptoms of kidney injury like trouble passing urine or change in the amount of urine -signs and symptoms of liver injury like dark yellow or brown urine; general ill feeling or flu-like symptoms; light-colored stools; loss of appetite; nausea; right upper belly pain; unusually weak or tired; yellowing of the eyes or skin -stiff neck -vomiting Side effects that usually do not require medical attention (report to your doctor or health care professional if they continue or are bothersome): -dizziness -hair loss -headache -stomach pain -upset stomach This list may not describe all possible side effects. Call your doctor for medical advice about side effects. You may report side effects to FDA at 1-800-FDA-1088. Where should I keep my medicine? If you are using this medicine at home,  you will be instructed on how to store this medicine. Throw away any unused medicine after the expiration date on the label. NOTE: This sheet is a summary. It may not cover all possible information. If you have questions about this medicine, talk to your doctor, pharmacist, or health care provider.  2018 [employer] Standard (2014-06-05 12:36:41)   Cyclophosphamide injection What is this medicine? CYCLOPHOSPHAMIDE (sye kloe FOSS fa mide) is a chemotherapy drug. It slows the growth of cancer cells. This medicine is used to treat many types of cancer like lymphoma, myeloma, leukemia, breast cancer, and ovarian cancer, to name a few. This  medicine may be used for other purposes; ask your health care provider or pharmacist if you have questions. COMMON BRAND NAME(S): Cytoxan, Neosar What should I tell my health care provider before I take this medicine? They need to know if you have any of these conditions: -blood disorders -history of other chemotherapy -infection -kidney disease -liver disease -recent or ongoing radiation therapy -tumors in the bone marrow -an unusual or allergic reaction to cyclophosphamide, other chemotherapy, other medicines, foods, dyes, or preservatives -pregnant or trying to get pregnant -breast-feeding How should I use this medicine? This drug is usually given as an injection into a vein or muscle or by infusion into a vein. It is administered in a hospital or clinic by a specially trained health care professional. Talk to your pediatrician regarding the use of this medicine in children. Special care may be needed. Overdosage: If you think you have taken too much of this medicine contact a poison control center or emergency room at once. NOTE: This medicine is only for you. Do not share this medicine with others. What if I miss a dose? It is important not to miss your dose. Call your doctor or health care professional if you are unable to keep an appointment. What may interact with this medicine? This medicine may interact with the following medications: -amiodarone -amphotericin B -azathioprine -certain antiviral medicines for HIV or AIDS such as protease inhibitors (e.g., indinavir, ritonavir) and zidovudine -certain blood pressure medications such as benazepril, captopril, enalapril, fosinopril, lisinopril, moexipril, monopril, perindopril, quinapril, ramipril, trandolapril -certain cancer medications such as anthracyclines (e.g., daunorubicin, doxorubicin), busulfan, cytarabine, paclitaxel, pentostatin, tamoxifen, trastuzumab -certain diuretics such as chlorothiazide, chlorthalidone,  hydrochlorothiazide, indapamide, metolazone -certain medicines that treat or prevent blood clots like warfarin -certain muscle relaxants such as succinylcholine -cyclosporine -etanercept -indomethacin -medicines to increase blood counts like filgrastim, pegfilgrastim, sargramostim -medicines used as general anesthesia -metronidazole -natalizumab This list may not describe all possible interactions. Give your health care provider a list of all the medicines, herbs, non-prescription drugs, or dietary supplements you use. Also tell them if you smoke, drink alcohol, or use illegal drugs. Some items may interact with your medicine. What should I watch for while using this medicine? Visit your doctor for checks on your progress. This drug may make you feel generally unwell. This is not uncommon, as chemotherapy can affect healthy cells as well as cancer cells. Report any side effects. Continue your course of treatment even though you feel ill unless your doctor tells you to stop. Drink water or other fluids as directed. Urinate often, even at night. In some cases, you may be given additional medicines to help with side effects. Follow all directions for their use. Call your doctor or health care professional for advice if you get a fever, chills or sore throat, or other symptoms of a cold or flu. Do not treat yourself. This  drug decreases your body's ability to fight infections. Try to avoid being around people who are sick. This medicine may increase your risk to bruise or bleed. Call your doctor or health care professional if you notice any unusual bleeding. Be careful brushing and flossing your teeth or using a toothpick because you may get an infection or bleed more easily. If you have any dental work done, tell your dentist you are receiving this medicine. You may get drowsy or dizzy. Do not drive, use machinery, or do anything that needs mental alertness until you know how this medicine affects  you. Do not become pregnant while taking this medicine or for 1 year after stopping it. Women should inform their doctor if they wish to become pregnant or think they might be pregnant. Men should not father a child while taking this medicine and for 4 months after stopping it. There is a potential for serious side effects to an unborn child. Talk to your health care professional or pharmacist for more information. Do not breast-feed an infant while taking this medicine. This medicine may interfere with the ability to have a child. This medicine has caused ovarian failure in some women. This medicine has caused reduced sperm counts in some men. You should talk with your doctor or health care professional if you are concerned about your fertility. If you are going to have surgery, tell your doctor or health care professional that you have taken this medicine. What side effects may I notice from receiving this medicine? Side effects that you should report to your doctor or health care professional as soon as possible: -allergic reactions like skin rash, itching or hives, swelling of the face, lips, or tongue -low blood counts - this medicine may decrease the number of white blood cells, red blood cells and platelets. You may be at increased risk for infections and bleeding. -signs of infection - fever or chills, cough, sore throat, pain or difficulty passing urine -signs of decreased platelets or bleeding - bruising, pinpoint red spots on the skin, black, tarry stools, blood in the urine -signs of decreased red blood cells - unusually weak or tired, fainting spells, lightheadedness -breathing problems -dark urine -dizziness -palpitations -swelling of the ankles, feet, hands -trouble passing urine or change in the amount of urine -weight gain -yellowing of the eyes or skin Side effects that usually do not require medical attention (report to your doctor or health care professional if they continue or  are bothersome): -changes in nail or skin color -hair loss -missed menstrual periods -mouth sores -nausea, vomiting This list may not describe all possible side effects. Call your doctor for medical advice about side effects. You may report side effects to FDA at 1-800-FDA-1088. Where should I keep my medicine? This drug is given in a hospital or clinic and will not be stored at home. NOTE: This sheet is a summary. It may not cover all possible information. If you have questions about this medicine, talk to your doctor, pharmacist, or health care provider.  2018 [employer] Standard (2011-12-30 16:22:58)

## 2017-05-12 ENCOUNTER — Ambulatory Visit: Payer: BC Managed Care – PPO | Admitting: Oncology

## 2017-05-12 ENCOUNTER — Other Ambulatory Visit: Payer: BC Managed Care – PPO

## 2017-05-29 NOTE — Progress Notes (Signed)
[place]  [phone] [phone]     ID: Brooke [name] DOB: October 09, 1950  MR#: [id number]  XBD#:[id number]  Patient Care Team: [name], DO as PCP - General (Family Medicine) Phineas Real, Belinda Block, MD (Obstetrics and Gynecology) Rolm Bookbinder, MD as Attending Physician (Dermatology) Katalyna Socarras, Virgie Dad, MD as Consulting Physician (Oncology) Brooke Rudd, MD as Consulting Physician (Radiation Oncology) Jovita Kussmaul, MD as Consulting Physician (General Surgery) OTHER MD:  CHIEF COMPLAINT: Estrogen receptor positive breast cancer  CURRENT TREATMENT: Neoadjuvant chemotherapy [TC then CMF]  HISTORY OF CURRENT ILLNESS: From the original intake note:  Brooke [name] herself palpated a change in her left breast and brought this to medical attention.  On 01/18/2017 she underwent bilateral diagnostic mammography with tomography and bilateral breast ultrasonography at [facility].  The breast density was category B.  At the most recent exam was from December 2016.  In the right breast upper outer quadrant there was a 1 cm irregular mass associated with punctate calcification.  By ultrasound this was not well-defined, and it was biopsied on 01/26/2017 with tomographic guidance this showed a complex sclerosing lesion with the usual ductal hyperplasia.  The right axilla was sonographically  On the left mammography showed a 4 cm dense mass with indistinct margins in the upper inner quadrant.  Ultrasound defined a 3 cm irregular mass in the upper inner quadrant of the left breast which was palpable.  There was an abnormal lymph node in the left axilla.  Biopsy of the left mass and abnormal lymph node ([id number], on 01/26/2017) showed both to be involved by invasive ductal carcinoma, with some lobular features but E-cadherin positive, estrogen and progesterone receptors both 100% positive, with strong staining intensity, with MIB-1 between 15-20%.  There was no HER-2  amplification, the signals ratio being 1.31/1.58 and the number per cell 2.30/2.05  The patient's subsequent history is as detailed below.  Brooke [name] returns today for follow up and treatment of her estrogen receptor positive breast cancer. She receives CMF treatment every 21 days with a total of 7 doses planned. Today is day 1 cycle 4. She reports that her last cycle went "okay" and she doesn't have any after effects following the last cycle of chemotherapy.       REVIEW OF SYSTEMS: Brooke [name] reports that for exercise, that she has not been exercising as she would like to recently. She is currently on the fence regarding breast reconstruction and would like an appointment with the specialist. She is on the fence due to thinking how the surgery will affect her in the long-term. She has a subtle softening to her left side that she has noticed. She reports altered sleep pattern that is she unsure on what to contribute it too, whether it be the chemo or being unable to "shut her brain down". She has night sweats sometimes and she has a lot of interrupted sleep. She has mild fatigue. She denies unusual headaches, visual changes, nausea, vomiting, or dizziness. There has been no unusual cough, phlegm production, or pleurisy. This been no change in bowel or bladder habits. She denies unexplained fatigue or unexplained weight loss, bleeding, rash, or fever. A detailed review of systems was otherwise stable.       PAST MEDICAL HISTORY: Past Medical History:  Diagnosis Date  . Anemia   . Anxiety   . Arthritis   . Bilateral dry eyes   . Cancer ([place])   . Family history of breast cancer  02/02/2017  . Family history of colon cancer 02/02/2017  . Family history of prostate cancer in father 02/02/2017  . Fibroid   . History of kidney stones   . Hyperlipidemia   . Kidney stones   . Melanoma of upper arm ([place]) 2009   RIGHT ARM ; left breast cancer, squamous cell carcinoma of the skin  . Obesity  (BMI 30.0-34.9)   . Osteopenia 05/2015   T score -1.3 FRAX 6%/0.5% stable from prior DEXA  . PONV (postoperative nausea and vomiting)   . UTI (urinary tract infection)   . Varicose veins of both lower extremities   . Vitamin D deficiency 07/2009   LOW VITAMIN D 29  . Vitamin deficiency 07/2008   VITAMIN D LOW 23    PAST SURGICAL HISTORY: Past Surgical History:  Procedure Laterality Date  . ABDOMINAL SURGERY  1975   ovairan cyst   . APPENDECTOMY    . BREAST SURGERY     bio  . CHOLECYSTECTOMY    . DERMOID CYST  EXCISION    . DILATION AND CURETTAGE OF UTERUS  1998  . GALLBLADDER SURGERY    . HYSTEROSCOPY    . insertion  port a cath  03/02/2016  . PORTACATH PLACEMENT N/A 03/02/2017   Procedure: INSERTION PORT-A-CATH;  Surgeon: Jovita Kussmaul, MD;  Location: [facility];  Service: General;  Laterality: N/A;  . SKIN BIOPSY    . SKIN CANCER EXCISION  2010,2011   2010-RIGHT ARM, 2011-LEFT LOWER LEG.-DR Rolm Bookbinder DERM. DR. Sarajane Jews IS HER DERM SURGEON.    FAMILY HISTORY Family History  Problem Relation Age of Onset  . Diabetes Father   . Hypertension Father   . Heart disease Father   . Prostate cancer Father 105  . Breast cancer Sister 21       again at age 1, GT results unk  . Skin cancer Sister   . Colon cancer Paternal Grandfather 80  . Hypertension Mother   . Heart failure Mother   The patient's father was diagnosed with prostate cancer in his 59s and died at age 3 from complications of that disease.  The patient's mother died at age 39 with congestive heart failure.  The patient had no brothers, 3 sisters.  One sister had breast cancer at age 44 and again at age 49.  She has been genetically tested and was found to be BRCA not mutated in addition a paternal grandfather had colon cancer in his 91s.  There is no history of ovarian cancer in the family to the patient's knowledge.  GYNECOLOGIC HISTORY:  Patient's last menstrual period was 03/18/1997. Menarche age 63. The patient is  GX P0.  She stopped having periods in 1999 and took hormone replacement approximately 5 years.  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 She is single and lives alone with no pets.    ADVANCED DIRECTIVES:    HEALTH MAINTENANCE: Social History   Tobacco Use  . Smoking status: Never Smoker  . Smokeless tobacco: Never Used  Substance Use Topics  . Alcohol use: No    Frequency: Never    Comment: rare  . Drug use: No     Colonoscopy: Never  PAP:  Bone density: 2017/osteopenia   Allergies  Allergen Reactions  . Neosporin [Neomycin-Bacitracin Zn-Polymyx] Swelling, Rash and Other (See Comments)    Blisters  . Benzalkonium Chloride Other (See Comments)  Redness  . Latex Rash    Current Outpatient Medications  Medication Sig Dispense Refill  . acetaminophen (TYLENOL) 500 MG tablet Take 500 mg by mouth every 6 (six) hours as needed for moderate pain or headache.    . alclomethasone (ACLOVATE) 0.05 % cream Apply to affected area once daily as needed for dermatitis  0  . Hyprom-Naphaz-Polysorb-Zn Sulf (CLEAR EYES COMPLETE OP) Place 1-2 drops into both eyes daily as needed (for dry eyes).    Marland Kitchen ibuprofen (ADVIL,MOTRIN) 200 MG tablet Take 200 mg by mouth every 6 (six) hours as needed for headache or moderate pain.     No current facility-administered medications for this visit.     OBJECTIVE: Middle-aged white woman in no acute distress  Vitals:   05/30/17 0830  BP: 140/60  Pulse: 72  Resp: 18  Temp: 97.8 F (36.6 C)  SpO2: 100%     Body mass index is 32.87 kg/m.   Wt Readings from Last 3 Encounters:  05/30/17 222 lb 9.6 oz (101 kg)  05/09/17 219 lb 4 oz (99.5 kg)  04/28/17 216 lb 1.6 oz (98 kg)      ECOG FS:1 - Symptomatic but completely ambulatory  Sclerae unicteric, EOMs intact Oropharynx clear and moist No cervical or supraclavicular  adenopathy Lungs no rales or rhonchi Heart regular rate and rhythm Abd soft, nontender, positive bowel sounds MSK no focal spinal tenderness, no upper extremity lymphedema Neuro: nonfocal, well oriented, appropriate affect Breasts: Right breast is benign.  The mass in the left breast upper inner quadrant, more or less centrally, is still palpable.  It is movable.  It measures about 3 cm by palpation.  It is nontender.  Both axillae are benign.   LAB RESULTS:  CMP     Component Value Date/Time   NA 138 05/09/2017 1025   NA 141 03/03/2017 1358   K 4.2 05/09/2017 1025   K 3.3 (L) 03/03/2017 1358   CL 106 05/09/2017 1025   CO2 25 05/09/2017 1025   CO2 28 03/03/2017 1358   GLUCOSE 98 05/09/2017 1025   GLUCOSE 107 03/03/2017 1358   BUN 24 05/09/2017 1025   BUN 12.7 03/03/2017 1358   CREATININE 0.95 05/09/2017 1025   CREATININE 0.9 03/03/2017 1358   CALCIUM 9.6 05/09/2017 1025   CALCIUM 9.4 03/03/2017 1358   PROT 6.6 05/09/2017 1025   PROT 6.5 03/03/2017 1358   ALBUMIN 3.7 05/09/2017 1025   ALBUMIN 3.7 03/03/2017 1358   AST 12 05/09/2017 1025   AST 23 03/03/2017 1358   ALT 15 05/09/2017 1025   ALT 25 03/03/2017 1358   ALKPHOS 100 05/09/2017 1025   ALKPHOS 71 03/03/2017 1358   BILITOT 0.6 05/09/2017 1025   BILITOT 0.52 03/03/2017 1358   GFRNONAA >60 05/09/2017 1025   GFRAA >60 05/09/2017 1025    No results found for: TOTALPROTELP, ALBUMINELP, A1GS, A2GS, BETS, BETA2SER, GAMS, MSPIKE, SPEI  No results found for: KPAFRELGTCHN, LAMBDASER, KAPLAMBRATIO  Lab Results  Component Value Date   WBC 3.5 (L) 05/09/2017   NEUTROABS 1.9 05/09/2017   HGB 13.7 03/13/2017   HCT 39.1 05/09/2017   MCV 89.3 05/09/2017   PLT 288 05/09/2017      Chemistry      Component Value Date/Time   NA 138 05/09/2017 1025   NA 141 03/03/2017 1358   K 4.2 05/09/2017 1025   K 3.3 (L) 03/03/2017 1358   CL 106 05/09/2017 1025   CO2 25 05/09/2017 1025   CO2 28  03/03/2017 1358   BUN 24  05/09/2017 1025   BUN 12.7 03/03/2017 1358   CREATININE 0.95 05/09/2017 1025   CREATININE 0.9 03/03/2017 1358      Component Value Date/Time   CALCIUM 9.6 05/09/2017 1025   CALCIUM 9.4 03/03/2017 1358   ALKPHOS 100 05/09/2017 1025   ALKPHOS 71 03/03/2017 1358   AST 12 05/09/2017 1025   AST 23 03/03/2017 1358   ALT 15 05/09/2017 1025   ALT 25 03/03/2017 1358   BILITOT 0.6 05/09/2017 1025   BILITOT 0.52 03/03/2017 1358       No results found for: LABCA2  No components found for: JQZESP233  No results for input(s): INR in the last 168 hours.  No results found for: LABCA2  No results found for: AQT622  No results found for: QJF354  No results found for: TGY563  No results found for: CA2729  No components found for: HGQUANT  No results found for: CEA1 / No results found for: CEA1   No results found for: AFPTUMOR  No results found for: CHROMOGRNA  No results found for: PSA1  No visits with results within 3 Day(s) from this visit.  Latest known visit with results is:  Appointment on 05/09/2017  Component Date Value Ref Range Status  . Sodium 05/09/2017 138  136 - 145 mmol/L Final  . Potassium 05/09/2017 4.2  3.5 - 5.1 mmol/L Final  . Chloride 05/09/2017 106  98 - 109 mmol/L Final  . CO2 05/09/2017 25  22 - 29 mmol/L Final  . Glucose, Bld 05/09/2017 98  70 - 140 mg/dL Final  . BUN 05/09/2017 24  7 - 26 mg/dL Final  . Creatinine 05/09/2017 0.95  0.60 - 1.10 mg/dL Final  . Calcium 05/09/2017 9.6  8.4 - 10.4 mg/dL Final  . Total Protein 05/09/2017 6.6  6.4 - 8.3 g/dL Final  . Albumin 05/09/2017 3.7  3.5 - 5.0 g/dL Final  . AST 05/09/2017 12  5 - 34 U/L Final  . ALT 05/09/2017 15  0 - 55 U/L Final  . Alkaline Phosphatase 05/09/2017 100  40 - 150 U/L Final  . Total Bilirubin 05/09/2017 0.6  0.2 - 1.2 mg/dL Final  . GFR, Est Non Af Am 05/09/2017 >60  >60 mL/min Final  . GFR, Est AFR Am 05/09/2017 >60  >60 mL/min Final   Comment: (NOTE) The eGFR has been calculated  using the CKD EPI equation. This calculation has not been validated in all clinical situations. eGFR's persistently <60 mL/min signify possible Chronic Kidney Disease.   Georgiann Hahn gap 05/09/2017 7  3 - 11 Final   Performed at [facility], [place] [address], [place], [place] [address]  . WBC Count 05/09/2017 3.5* 3.9 - 10.3 K/uL Final  . RBC 05/09/2017 4.38  3.70 - 5.45 MIL/uL Final  . Hemoglobin 05/09/2017 12.7  11.6 - 15.9 g/dL Final  . HCT 05/09/2017 39.1  34.8 - 46.6 % Final  . MCV 05/09/2017 89.3  79.5 - 101.0 fL Final  . MCH 05/09/2017 29.0  25.1 - 34.0 pg Final  . MCHC 05/09/2017 32.5  31.5 - 36.0 g/dL Final  . RDW 05/09/2017 14.5  11.2 - 14.5 % Final  . Platelet Count 05/09/2017 288  145 - 400 K/uL Final  . Neutrophils Relative % 05/09/2017 54  % Final  . Neutro Abs 05/09/2017 1.9  1.5 - 6.5 K/uL Final  . Lymphocytes Relative 05/09/2017 21  % Final  . Lymphs Abs 05/09/2017  0.7* 0.9 - 3.3 K/uL Final  . Monocytes Relative 05/09/2017 21  % Final  . Monocytes Absolute 05/09/2017 0.7  0.1 - 0.9 K/uL Final  . Eosinophils Relative 05/09/2017 3  % Final  . Eosinophils Absolute 05/09/2017 0.1  0.0 - 0.5 K/uL Final  . Basophils Relative 05/09/2017 1  % Final  . Basophils Absolute 05/09/2017 0.0  0.0 - 0.1 K/uL Final   Performed at [facility], [address] [place], [place] [address]    (this displays the last labs from the last 3 days)  No results found for: TOTALPROTELP, ALBUMINELP, A1GS, A2GS, BETS, BETA2SER, GAMS, MSPIKE, SPEI (this displays SPEP labs)  No results found for: KPAFRELGTCHN, LAMBDASER, KAPLAMBRATIO (kappa/lambda light chains)  No results found for: HGBA, HGBA2QUANT, HGBFQUANT, HGBSQUAN (Hemoglobinopathy evaluation)   No results found for: LDH  No results found for: IRON, TIBC, IRONPCTSAT (Iron and TIBC)  No results found for: FERRITIN  Urinalysis    Component Value Date/Time   COLORURINE YELLOW 05/29/2015  [place] 05/29/2015 1113   LABSPEC 1.023 05/29/2015 1113   PHURINE 5.5 05/29/2015 1113   GLUCOSEU NEGATIVE 05/29/2015 1113   GLUCOSEU NEGATIVE 11/05/2012 1559   HGBUR NEGATIVE 05/29/2015 1113   BILIRUBINUR negative 02/10/2017 1622   KETONESUR NEGATIVE 05/29/2015 1113   PROTEINUR negative 02/10/2017 1622   PROTEINUR NEGATIVE 05/29/2015 1113   UROBILINOGEN 0.2 02/10/2017 1622   UROBILINOGEN 0.2 11/05/2012 1559   NITRITE negative 02/10/2017 1622   NITRITE NEGATIVE 05/29/2015 1113   LEUKOCYTESUR Negative 02/10/2017 1622     STUDIES: No results found.  ELIGIBLE FOR AVAILABLE RESEARCH PROTOCOL: No  ASSESSMENT: 67 y.o. [place] woman status post bilateral biopsies 01/26/2017, showing  (1) in the right breast, a complex sclerosing lesion  (2) in the left breast, a cT2 pN1 invasive ductal carcinoma (with some lobular features but E-cadherin positive), grade 2, estrogen and progesterone receptor positive, HER-2 not amplified, with an MIB-1 of 15-20%  (a) breast MRI 02/04/2017 suggests a T3 N1 tumor  (3) started neoadjuvant cyclophosphamide/docetaxel 03/03/2017, discontinued after 1 cycle with very poor tolerance  (a) started cyclophosphamide/methotrexate/fluorouracil (CMF) 03/28/2017, to be repeated for 7 cycles 21 days apart  (4) definitive surgery to follow  (5) adjuvant radiation to follow surgery  ( 6) to start antiestrogens at the completion of local treatment  PLAN: Kalyani is tolerating CMF remarkably well and she has 3 cycles to go before she can proceed to definitive surgery.  She is beginning to consider reconstruction and we are referring her to Dr. Iran Planas so she can discuss options and start planning if she does decide to do that which type of definitive surgery she might have.  I reassured her that even though the mass in the left breast is still palpable, the main purpose of chemotherapy is to sterilize it the bodies possible microscopic tumor  deposits.  Chemotherapy generally reduces that risk by about one third and of course her antiestrogens eventually will reduce that risk by an additional half.  I have encouraged her to exercise.  She is having some sleep disturbance but at this point I am not starting either venlafaxine or gabapentin.  We can discuss that at the next visit depending on how she is doing.  She will return for treatment on 06/20/2017.  I will see her a week later.  She knows to call for any other issues that may develop before then.       Caytlin Better, Virgie Dad, MD  05/30/17 8:49 AM Medical Oncology and Hematology [facility] [address] [place], [place] [address] Tel. [phone]    Fax. [phone]  This document serves as a record of services personally performed by Lurline Del, MD. It was created on his behalf by Steva Colder, a trained medical scribe. The creation of this record is based on the scribe's personal observations and the provider's statements to them.   I have reviewed the above documentation for accuracy and completeness, and I agree with the above.

## 2017-05-30 ENCOUNTER — Other Ambulatory Visit: Payer: Medicare Other

## 2017-05-30 ENCOUNTER — Inpatient Hospital Stay: Payer: Medicare Other

## 2017-05-30 ENCOUNTER — Telehealth: Payer: Self-pay | Admitting: Oncology

## 2017-05-30 ENCOUNTER — Inpatient Hospital Stay: Payer: Medicare Other | Attending: Oncology | Admitting: Oncology

## 2017-05-30 ENCOUNTER — Ambulatory Visit: Payer: Medicare Other

## 2017-05-30 VITALS — BP 140/60 | HR 72 | Temp 97.8°F | Resp 18 | Ht 69.0 in | Wt 222.6 lb

## 2017-05-30 DIAGNOSIS — D702 Other drug-induced agranulocytosis: Secondary | ICD-10-CM

## 2017-05-30 DIAGNOSIS — R5383 Other fatigue: Secondary | ICD-10-CM | POA: Diagnosis not present

## 2017-05-30 DIAGNOSIS — E785 Hyperlipidemia, unspecified: Secondary | ICD-10-CM | POA: Diagnosis not present

## 2017-05-30 DIAGNOSIS — C50812 Malignant neoplasm of overlapping sites of left female breast: Secondary | ICD-10-CM

## 2017-05-30 DIAGNOSIS — R61 Generalized hyperhidrosis: Secondary | ICD-10-CM | POA: Diagnosis not present

## 2017-05-30 DIAGNOSIS — M8580 Other specified disorders of bone density and structure, unspecified site: Secondary | ICD-10-CM | POA: Insufficient documentation

## 2017-05-30 DIAGNOSIS — Z79899 Other long term (current) drug therapy: Secondary | ICD-10-CM | POA: Diagnosis not present

## 2017-05-30 DIAGNOSIS — Z170 Estrogen receptor positive status [ER+]: Secondary | ICD-10-CM | POA: Diagnosis not present

## 2017-05-30 DIAGNOSIS — C50212 Malignant neoplasm of upper-inner quadrant of left female breast: Secondary | ICD-10-CM | POA: Insufficient documentation

## 2017-05-30 DIAGNOSIS — Z8042 Family history of malignant neoplasm of prostate: Secondary | ICD-10-CM | POA: Diagnosis not present

## 2017-05-30 DIAGNOSIS — Z95828 Presence of other vascular implants and grafts: Secondary | ICD-10-CM

## 2017-05-30 DIAGNOSIS — Z800 Family history of malignant neoplasm of digestive organs: Secondary | ICD-10-CM | POA: Diagnosis not present

## 2017-05-30 DIAGNOSIS — Z85820 Personal history of malignant melanoma of skin: Secondary | ICD-10-CM | POA: Diagnosis not present

## 2017-05-30 DIAGNOSIS — Z803 Family history of malignant neoplasm of breast: Secondary | ICD-10-CM | POA: Diagnosis not present

## 2017-05-30 DIAGNOSIS — Z5111 Encounter for antineoplastic chemotherapy: Secondary | ICD-10-CM | POA: Insufficient documentation

## 2017-05-30 DIAGNOSIS — G479 Sleep disorder, unspecified: Secondary | ICD-10-CM | POA: Insufficient documentation

## 2017-05-30 LAB — CBC WITH DIFFERENTIAL (CANCER CENTER ONLY)
BASOS ABS: 0 10*3/uL (ref 0.0–0.1)
Basophils Relative: 1 %
EOS ABS: 0.1 10*3/uL (ref 0.0–0.5)
Eosinophils Relative: 3 %
HCT: 37.1 % (ref 34.8–46.6)
Hemoglobin: 12.2 g/dL (ref 11.6–15.9)
LYMPHS PCT: 21 %
Lymphs Abs: 0.6 10*3/uL — ABNORMAL LOW (ref 0.9–3.3)
MCH: 29.7 pg (ref 25.1–34.0)
MCHC: 32.9 g/dL (ref 31.5–36.0)
MCV: 90.3 fL (ref 79.5–101.0)
MONO ABS: 0.6 10*3/uL (ref 0.1–0.9)
Monocytes Relative: 19 %
Neutro Abs: 1.7 10*3/uL (ref 1.5–6.5)
Neutrophils Relative %: 56 %
PLATELETS: 264 10*3/uL (ref 145–400)
RBC: 4.11 MIL/uL (ref 3.70–5.45)
RDW: 14.8 % — AB (ref 11.2–14.5)
WBC: 3 10*3/uL — AB (ref 3.9–10.3)

## 2017-05-30 LAB — CMP (CANCER CENTER ONLY)
ALT: 18 U/L (ref 0–55)
AST: 16 U/L (ref 5–34)
Albumin: 3.8 g/dL (ref 3.5–5.0)
Alkaline Phosphatase: 90 U/L (ref 40–150)
Anion gap: 7 (ref 3–11)
BUN: 12 mg/dL (ref 7–26)
CHLORIDE: 107 mmol/L (ref 98–109)
CO2: 26 mmol/L (ref 22–29)
Calcium: 9.6 mg/dL (ref 8.4–10.4)
Creatinine: 0.75 mg/dL (ref 0.60–1.10)
Glucose, Bld: 99 mg/dL (ref 70–140)
Potassium: 3.8 mmol/L (ref 3.5–5.1)
SODIUM: 140 mmol/L (ref 136–145)
Total Bilirubin: 0.5 mg/dL (ref 0.2–1.2)
Total Protein: 6.6 g/dL (ref 6.4–8.3)

## 2017-05-30 MED ORDER — SODIUM CHLORIDE 0.9% FLUSH
10.0000 mL | INTRAVENOUS | Status: DC | PRN
  Administered 2017-05-30: 10 mL
  Filled 2017-05-30: qty 10

## 2017-05-30 MED ORDER — SODIUM CHLORIDE 0.9 % IV SOLN
Freq: Once | INTRAVENOUS | Status: AC
  Administered 2017-05-30: 11:00:00 via INTRAVENOUS

## 2017-05-30 MED ORDER — HEPARIN SOD (PORK) LOCK FLUSH 100 UNIT/ML IV SOLN
500.0000 [IU] | Freq: Once | INTRAVENOUS | Status: AC | PRN
  Administered 2017-05-30: 500 [IU]
  Filled 2017-05-30: qty 5

## 2017-05-30 MED ORDER — DEXAMETHASONE SODIUM PHOSPHATE 10 MG/ML IJ SOLN
INTRAMUSCULAR | Status: AC
  Filled 2017-05-30: qty 1

## 2017-05-30 MED ORDER — SODIUM CHLORIDE 0.9 % IV SOLN
600.0000 mg/m2 | Freq: Once | INTRAVENOUS | Status: AC
  Administered 2017-05-30: 1320 mg via INTRAVENOUS
  Filled 2017-05-30: qty 66

## 2017-05-30 MED ORDER — METHOTREXATE SODIUM (PF) CHEMO INJECTION 250 MG/10ML
40.0000 mg/m2 | Freq: Once | INTRAMUSCULAR | Status: AC
  Administered 2017-05-30: 88 mg via INTRAVENOUS
  Filled 2017-05-30: qty 3.52

## 2017-05-30 MED ORDER — PROCHLORPERAZINE MALEATE 10 MG PO TABS
ORAL_TABLET | ORAL | Status: AC
Start: 2017-05-30 — End: 2017-05-30
  Filled 2017-05-30: qty 1

## 2017-05-30 MED ORDER — DEXAMETHASONE SODIUM PHOSPHATE 10 MG/ML IJ SOLN
10.0000 mg | Freq: Once | INTRAMUSCULAR | Status: AC
  Administered 2017-05-30: 10 mg via INTRAVENOUS

## 2017-05-30 MED ORDER — PROCHLORPERAZINE EDISYLATE 5 MG/ML IJ SOLN
10.0000 mg | Freq: Once | INTRAMUSCULAR | Status: AC
  Administered 2017-05-30: 10 mg via INTRAVENOUS

## 2017-05-30 MED ORDER — FLUOROURACIL CHEMO INJECTION 2.5 GM/50ML
600.0000 mg/m2 | Freq: Once | INTRAVENOUS | Status: AC
  Administered 2017-05-30: 1300 mg via INTRAVENOUS
  Filled 2017-05-30: qty 26

## 2017-05-30 MED ORDER — PROCHLORPERAZINE EDISYLATE 5 MG/ML IJ SOLN
INTRAMUSCULAR | Status: AC
  Filled 2017-05-30: qty 2

## 2017-05-30 NOTE — Patient Instructions (Signed)
[name] Discharge Instructions for Patients Receiving Chemotherapy  Today you received the following chemotherapy agents: Cytoxan, Methotrexate, 5FU.  To help prevent nausea and vomiting after your treatment, we encourage you to take your nausea medication as prescribed by MD.   If you develop nausea and vomiting that is not controlled by your nausea medication, call the clinic.   BELOW ARE SYMPTOMS THAT SHOULD BE REPORTED IMMEDIATELY:  *FEVER GREATER THAN 100.5 F  *CHILLS WITH OR WITHOUT FEVER  NAUSEA AND VOMITING THAT IS NOT CONTROLLED WITH YOUR NAUSEA MEDICATION  *UNUSUAL SHORTNESS OF BREATH  *UNUSUAL BRUISING OR BLEEDING  TENDERNESS IN MOUTH AND THROAT WITH OR WITHOUT PRESENCE OF ULCERS  *URINARY PROBLEMS  *BOWEL PROBLEMS  UNUSUAL RASH Items with * indicate a potential emergency and should be followed up as soon as possible.  Feel free to call the clinic should you have any questions or concerns. The clinic phone number is [phone].  Please show the [facility] at check-in to the Emergency Department and triage nurse.  Fluorouracil, 5-FU injection What is this medicine? FLUOROURACIL, 5-FU (flure oh YOOR a sil) is a chemotherapy drug. It slows the growth of cancer cells. This medicine is used to treat many types of cancer like breast cancer, colon or rectal cancer, pancreatic cancer, and stomach cancer. This medicine may be used for other purposes; ask your health care provider or pharmacist if you have questions. COMMON BRAND NAME(S): Adrucil What should I tell my health care provider before I take this medicine? They need to know if you have any of these conditions: -blood disorders -dihydropyrimidine dehydrogenase (DPD) deficiency -infection (especially a virus infection such as chickenpox, cold sores, or herpes) -kidney disease -liver disease -malnourished, poor nutrition -recent or ongoing radiation therapy -an unusual or allergic  reaction to fluorouracil, other chemotherapy, other medicines, foods, dyes, or preservatives -pregnant or trying to get pregnant -breast-feeding How should I use this medicine? This drug is given as an infusion or injection into a vein. It is administered in a hospital or clinic by a specially trained health care professional. Talk to your pediatrician regarding the use of this medicine in children. Special care may be needed. Overdosage: If you think you have taken too much of this medicine contact a poison control center or emergency room at once. NOTE: This medicine is only for you. Do not share this medicine with others. What if I miss a dose? It is important not to miss your dose. Call your doctor or health care professional if you are unable to keep an appointment. What may interact with this medicine? -allopurinol -cimetidine -dapsone -digoxin -hydroxyurea -leucovorin -levamisole -medicines for seizures like ethotoin, fosphenytoin, phenytoin -medicines to increase blood counts like filgrastim, pegfilgrastim, sargramostim -medicines that treat or prevent blood clots like warfarin, enoxaparin, and dalteparin -methotrexate -metronidazole -pyrimethamine -some other chemotherapy drugs like busulfan, cisplatin, estramustine, vinblastine -trimethoprim -trimetrexate -vaccines Talk to your doctor or health care professional before taking any of these medicines: -acetaminophen -aspirin -ibuprofen -ketoprofen -naproxen This list may not describe all possible interactions. Give your health care provider a list of all the medicines, herbs, non-prescription drugs, or dietary supplements you use. Also tell them if you smoke, drink alcohol, or use illegal drugs. Some items may interact with your medicine. What should I watch for while using this medicine? Visit your doctor for checks on your progress. This drug may make you feel generally unwell. This is not uncommon, as chemotherapy can  affect healthy cells  as well as cancer cells. Report any side effects. Continue your course of treatment even though you feel ill unless your doctor tells you to stop. In some cases, you may be given additional medicines to help with side effects. Follow all directions for their use. Call your doctor or health care professional for advice if you get a fever, chills or sore throat, or other symptoms of a cold or flu. Do not treat yourself. This drug decreases your body's ability to fight infections. Try to avoid being around people who are sick. This medicine may increase your risk to bruise or bleed. Call your doctor or health care professional if you notice any unusual bleeding. Be careful brushing and flossing your teeth or using a toothpick because you may get an infection or bleed more easily. If you have any dental work done, tell your dentist you are receiving this medicine. Avoid taking products that contain aspirin, acetaminophen, ibuprofen, naproxen, or ketoprofen unless instructed by your doctor. These medicines may hide a fever. Do not become pregnant while taking this medicine. Women should inform their doctor if they wish to become pregnant or think they might be pregnant. There is a potential for serious side effects to an unborn child. Talk to your health care professional or pharmacist for more information. Do not breast-feed an infant while taking this medicine. Men should inform their doctor if they wish to father a child. This medicine may lower sperm counts. Do not treat diarrhea with over the counter products. Contact your doctor if you have diarrhea that lasts more than 2 days or if it is severe and watery. This medicine can make you more sensitive to the sun. Keep out of the sun. If you cannot avoid being in the sun, wear protective clothing and use sunscreen. Do not use sun lamps or tanning beds/booths. What side effects may I notice from receiving this medicine? Side effects that  you should report to your doctor or health care professional as soon as possible: -allergic reactions like skin rash, itching or hives, swelling of the face, lips, or tongue -low blood counts - this medicine may decrease the number of white blood cells, red blood cells and platelets. You may be at increased risk for infections and bleeding. -signs of infection - fever or chills, cough, sore throat, pain or difficulty passing urine -signs of decreased platelets or bleeding - bruising, pinpoint red spots on the skin, black, tarry stools, blood in the urine -signs of decreased red blood cells - unusually weak or tired, fainting spells, lightheadedness -breathing problems -changes in vision -chest pain -mouth sores -nausea and vomiting -pain, swelling, redness at site where injected -pain, tingling, numbness in the hands or feet -redness, swelling, or sores on hands or feet -stomach pain -unusual bleeding Side effects that usually do not require medical attention (report to your doctor or health care professional if they continue or are bothersome): -changes in finger or toe nails -diarrhea -dry or itchy skin -hair loss -headache -loss of appetite -sensitivity of eyes to the light -stomach upset -unusually teary eyes This list may not describe all possible side effects. Call your doctor for medical advice about side effects. You may report side effects to FDA at 1-800-FDA-1088. Where should I keep my medicine? This drug is given in a hospital or clinic and will not be stored at home. NOTE: This sheet is a summary. It may not cover all possible information. If you have questions about this medicine, talk to your  doctor, pharmacist, or health care provider.  2018 Elsevier/[employer] Standard (2007-06-20 13:53:16)    Methotrexate injection What is this medicine? METHOTREXATE (METH oh TREX ate) is a chemotherapy drug used to treat cancer including breast cancer, leukemia, and lymphoma. This  medicine can also be used to treat psoriasis and certain kinds of arthritis. This medicine may be used for other purposes; ask your health care provider or pharmacist if you have questions. What should I tell my health care provider before I take this medicine? They need to know if you have any of these conditions: -fluid in the stomach area or lungs -if you often drink alcohol -infection or immune system problems -kidney disease -liver disease -low blood counts, like low white cell, platelet, or red cell counts -lung disease -radiation therapy -stomach ulcers -ulcerative colitis -an unusual or allergic reaction to methotrexate, other medicines, foods, dyes, or preservatives -pregnant or trying to get pregnant -breast-feeding How should I use this medicine? This medicine is for infusion into a vein or for injection into muscle or into the spinal fluid (whichever applies). It is usually given by a health care professional in a hospital or clinic setting. In rare cases, you might get this medicine at home. You will be taught how to give this medicine. Use exactly as directed. Take your medicine at regular intervals. Do not take your medicine more often than directed. If this medicine is used for arthritis or psoriasis, it should be taken weekly, NOT daily. It is important that you put your used needles and syringes in a special sharps container. Do not put them in a trash can. If you do not have a sharps container, call your pharmacist or healthcare provider to get one. Talk to your pediatrician regarding the use of this medicine in children. While this drug may be prescribed for children as young as 2 years for selected conditions, precautions do apply. Overdosage: If you think you have taken too much of this medicine contact a poison control center or emergency room at once. NOTE: This medicine is only for you. Do not share this medicine with others. What if I miss a dose? It is important  not to miss your dose. Call your doctor or health care professional if you are unable to keep an appointment. If you give yourself the medicine and you miss a dose, talk with your doctor or health care professional. Do not take double or extra doses. What may interact with this medicine? This medicine may interact with the following medications: -acitretin -aspirin or aspirin-like medicines including salicylates -azathioprine -certain antibiotics like chloramphenicol, penicillin, tetracycline -certain medicines for stomach problems like esomeprazole, omeprazole, pantoprazole -cyclosporine -gold -hydroxychloroquine -live virus vaccines -mercaptopurine -NSAIDs, medicines for pain and inflammation, like ibuprofen or naproxen -other cytotoxic agents -penicillamine -phenylbutazone -phenytoin -probenacid -retinoids such as isotretinoin and tretinoin -steroid medicines like prednisone or cortisone -sulfonamides like sulfasalazine and trimethoprim/sulfamethoxazole -theophylline This list may not describe all possible interactions. Give your health care provider a list of all the medicines, herbs, non-prescription drugs, or dietary supplements you use. Also tell them if you smoke, drink alcohol, or use illegal drugs. Some items may interact with your medicine. What should I watch for while using this medicine? Avoid alcoholic drinks. In some cases, you may be given additional medicines to help with side effects. Follow all directions for their use. This medicine can make you more sensitive to the sun. Keep out of the sun. If you cannot avoid being in the sun, wear protective  clothing and use sunscreen. Do not use sun lamps or tanning beds/booths. You may get drowsy or dizzy. Do not drive, use machinery, or do anything that needs mental alertness until you know how this medicine affects you. Do not stand or sit up quickly, especially if you are an older patient. This reduces the risk of dizzy or  fainting spells. You may need blood work done while you are taking this medicine. Call your doctor or health care professional for advice if you get a fever, chills or sore throat, or other symptoms of a cold or flu. Do not treat yourself. This drug decreases your body's ability to fight infections. Try to avoid being around people who are sick. This medicine may increase your risk to bruise or bleed. Call your doctor or health care professional if you notice any unusual bleeding. Check with your doctor or health care professional if you get an attack of severe diarrhea, nausea and vomiting, or if you sweat a lot. The loss of too much body fluid can make it dangerous for you to take this medicine. Talk to your doctor about your risk of cancer. You may be more at risk for certain types of cancers if you take this medicine. Both men and women must use effective birth control with this medicine. Do not become pregnant while taking this medicine or until at least 1 normal menstrual cycle has occurred after stopping it. Women should inform their doctor if they wish to become pregnant or think they might be pregnant. Men should not father a child while taking this medicine and for 3 months after stopping it. There is a potential for serious side effects to an unborn child. Talk to your health care professional or pharmacist for more information. Do not breast-feed an infant while taking this medicine. What side effects may I notice from receiving this medicine? Side effects that you should report to your doctor or health care professional as soon as possible: -allergic reactions like skin rash, itching or hives, swelling of the face, lips, or tongue -back pain -breathing problems or shortness of breath -confusion -diarrhea -dry, nonproductive cough -low blood counts - this medicine may decrease the number of white blood cells, red blood cells and platelets. You may be at increased risk of infections and  bleeding -mouth sores -redness, blistering, peeling or loosening of the skin, including inside the mouth -seizures -severe headaches -signs of infection - fever or chills, cough, sore throat, pain or difficulty passing urine -signs and symptoms of bleeding such as bloody or black, tarry stools; red or dark-brown urine; spitting up blood or brown material that looks like coffee grounds; red spots on the skin; unusual bruising or bleeding from the eye, gums, or nose -signs and symptoms of kidney injury like trouble passing urine or change in the amount of urine -signs and symptoms of liver injury like dark yellow or brown urine; general ill feeling or flu-like symptoms; light-colored stools; loss of appetite; nausea; right upper belly pain; unusually weak or tired; yellowing of the eyes or skin -stiff neck -vomiting Side effects that usually do not require medical attention (report to your doctor or health care professional if they continue or are bothersome): -dizziness -hair loss -headache -stomach pain -upset stomach This list may not describe all possible side effects. Call your doctor for medical advice about side effects. You may report side effects to FDA at 1-800-FDA-1088. Where should I keep my medicine? If you are using this medicine at home,  you will be instructed on how to store this medicine. Throw away any unused medicine after the expiration date on the label. NOTE: This sheet is a summary. It may not cover all possible information. If you have questions about this medicine, talk to your doctor, pharmacist, or health care provider.  2018 [employer]/Gold Standard (2014-06-05 12:36:41)   Cyclophosphamide injection What is this medicine? CYCLOPHOSPHAMIDE (sye kloe FOSS fa mide) is a chemotherapy drug. It slows the growth of cancer cells. This medicine is used to treat many types of cancer like lymphoma, myeloma, leukemia, breast cancer, and ovarian cancer, to name a few. This  medicine may be used for other purposes; ask your health care provider or pharmacist if you have questions. COMMON BRAND NAME(S): Cytoxan, Neosar What should I tell my health care provider before I take this medicine? They need to know if you have any of these conditions: -blood disorders -history of other chemotherapy -infection -kidney disease -liver disease -recent or ongoing radiation therapy -tumors in the bone marrow -an unusual or allergic reaction to cyclophosphamide, other chemotherapy, other medicines, foods, dyes, or preservatives -pregnant or trying to get pregnant -breast-feeding How should I use this medicine? This drug is usually given as an injection into a vein or muscle or by infusion into a vein. It is administered in a hospital or clinic by a specially trained health care professional. Talk to your pediatrician regarding the use of this medicine in children. Special care may be needed. Overdosage: If you think you have taken too much of this medicine contact a poison control center or emergency room at once. NOTE: This medicine is only for you. Do not share this medicine with others. What if I miss a dose? It is important not to miss your dose. Call your doctor or health care professional if you are unable to keep an appointment. What may interact with this medicine? This medicine may interact with the following medications: -amiodarone -amphotericin B -azathioprine -certain antiviral medicines for HIV or AIDS such as protease inhibitors (e.g., indinavir, ritonavir) and zidovudine -certain blood pressure medications such as benazepril, captopril, enalapril, fosinopril, lisinopril, moexipril, monopril, perindopril, quinapril, ramipril, trandolapril -certain cancer medications such as anthracyclines (e.g., daunorubicin, doxorubicin), busulfan, cytarabine, paclitaxel, pentostatin, tamoxifen, trastuzumab -certain diuretics such as chlorothiazide, chlorthalidone,  hydrochlorothiazide, indapamide, metolazone -certain medicines that treat or prevent blood clots like warfarin -certain muscle relaxants such as succinylcholine -cyclosporine -etanercept -indomethacin -medicines to increase blood counts like filgrastim, pegfilgrastim, sargramostim -medicines used as general anesthesia -metronidazole -natalizumab This list may not describe all possible interactions. Give your health care provider a list of all the medicines, herbs, non-prescription drugs, or dietary supplements you use. Also tell them if you smoke, drink alcohol, or use illegal drugs. Some items may interact with your medicine. What should I watch for while using this medicine? Visit your doctor for checks on your progress. This drug may make you feel generally unwell. This is not uncommon, as chemotherapy can affect healthy cells as well as cancer cells. Report any side effects. Continue your course of treatment even though you feel ill unless your doctor tells you to stop. Drink water or other fluids as directed. Urinate often, even at night. In some cases, you may be given additional medicines to help with side effects. Follow all directions for their use. Call your doctor or health care professional for advice if you get a fever, chills or sore throat, or other symptoms of a cold or flu. Do not treat yourself. This  drug decreases your body's ability to fight infections. Try to avoid being around people who are sick. This medicine may increase your risk to bruise or bleed. Call your doctor or health care professional if you notice any unusual bleeding. Be careful brushing and flossing your teeth or using a toothpick because you may get an infection or bleed more easily. If you have any dental work done, tell your dentist you are receiving this medicine. You may get drowsy or dizzy. Do not drive, use machinery, or do anything that needs mental alertness until you know how this medicine affects  you. Do not become pregnant while taking this medicine or for 1 year after stopping it. Women should inform their doctor if they wish to become pregnant or think they might be pregnant. Men should not father a child while taking this medicine and for 4 months after stopping it. There is a potential for serious side effects to an unborn child. Talk to your health care professional or pharmacist for more information. Do not breast-feed an infant while taking this medicine. This medicine may interfere with the ability to have a child. This medicine has caused ovarian failure in some women. This medicine has caused reduced sperm counts in some men. You should talk with your doctor or health care professional if you are concerned about your fertility. If you are going to have surgery, tell your doctor or health care professional that you have taken this medicine. What side effects may I notice from receiving this medicine? Side effects that you should report to your doctor or health care professional as soon as possible: -allergic reactions like skin rash, itching or hives, swelling of the face, lips, or tongue -low blood counts - this medicine may decrease the number of white blood cells, red blood cells and platelets. You may be at increased risk for infections and bleeding. -signs of infection - fever or chills, cough, sore throat, pain or difficulty passing urine -signs of decreased platelets or bleeding - bruising, pinpoint red spots on the skin, black, tarry stools, blood in the urine -signs of decreased red blood cells - unusually weak or tired, fainting spells, lightheadedness -breathing problems -dark urine -dizziness -palpitations -swelling of the ankles, feet, hands -trouble passing urine or change in the amount of urine -weight gain -yellowing of the eyes or skin Side effects that usually do not require medical attention (report to your doctor or health care professional if they continue or  are bothersome): -changes in nail or skin color -hair loss -missed menstrual periods -mouth sores -nausea, vomiting This list may not describe all possible side effects. Call your doctor for medical advice about side effects. You may report side effects to FDA at 1-800-FDA-1088. Where should I keep my medicine? This drug is given in a hospital or clinic and will not be stored at home. NOTE: This sheet is a summary. It may not cover all possible information. If you have questions about this medicine, talk to your doctor, pharmacist, or health care provider.  2018 [employer]/Gold Standard (2011-12-30 16:22:58)

## 2017-05-30 NOTE — Telephone Encounter (Signed)
Gave avs and calendar ° °

## 2017-06-20 ENCOUNTER — Inpatient Hospital Stay: Payer: Medicare Other

## 2017-06-20 VITALS — BP 161/73 | HR 78 | Temp 98.5°F | Resp 18

## 2017-06-20 DIAGNOSIS — Z170 Estrogen receptor positive status [ER+]: Principal | ICD-10-CM

## 2017-06-20 DIAGNOSIS — Z95828 Presence of other vascular implants and grafts: Secondary | ICD-10-CM

## 2017-06-20 DIAGNOSIS — C50812 Malignant neoplasm of overlapping sites of left female breast: Secondary | ICD-10-CM

## 2017-06-20 DIAGNOSIS — C50212 Malignant neoplasm of upper-inner quadrant of left female breast: Secondary | ICD-10-CM | POA: Diagnosis not present

## 2017-06-20 LAB — CBC WITH DIFFERENTIAL (CANCER CENTER ONLY)
BASOS ABS: 0 10*3/uL (ref 0.0–0.1)
BASOS PCT: 1 %
Eosinophils Absolute: 0.1 10*3/uL (ref 0.0–0.5)
Eosinophils Relative: 3 %
HEMATOCRIT: 38.5 % (ref 34.8–46.6)
HEMOGLOBIN: 12.4 g/dL (ref 11.6–15.9)
LYMPHS PCT: 28 %
Lymphs Abs: 0.8 10*3/uL — ABNORMAL LOW (ref 0.9–3.3)
MCH: 29.6 pg (ref 25.1–34.0)
MCHC: 32.2 g/dL (ref 31.5–36.0)
MCV: 91.9 fL (ref 79.5–101.0)
MONO ABS: 0.4 10*3/uL (ref 0.1–0.9)
MONOS PCT: 15 %
NEUTROS ABS: 1.5 10*3/uL (ref 1.5–6.5)
NEUTROS PCT: 53 %
Platelet Count: 245 10*3/uL (ref 145–400)
RBC: 4.19 MIL/uL (ref 3.70–5.45)
RDW: 15 % — ABNORMAL HIGH (ref 11.2–14.5)
WBC Count: 2.9 10*3/uL — ABNORMAL LOW (ref 3.9–10.3)

## 2017-06-20 LAB — CMP (CANCER CENTER ONLY)
ALBUMIN: 4 g/dL (ref 3.5–5.0)
ALT: 27 U/L (ref 0–55)
ANION GAP: 9 (ref 3–11)
AST: 25 U/L (ref 5–34)
Alkaline Phosphatase: 102 U/L (ref 40–150)
BUN: 12 mg/dL (ref 7–26)
CALCIUM: 9.4 mg/dL (ref 8.4–10.4)
CO2: 24 mmol/L (ref 22–29)
Chloride: 107 mmol/L (ref 98–109)
Creatinine: 0.79 mg/dL (ref 0.60–1.10)
GFR, Estimated: >60 mL/min (ref >60)
GLUCOSE: 96 mg/dL (ref 70–140)
POTASSIUM: 3.9 mmol/L (ref 3.5–5.1)
SODIUM: 140 mmol/L (ref 136–145)
Total Bilirubin: 0.6 mg/dL (ref 0.2–1.2)
Total Protein: 6.7 g/dL (ref 6.4–8.3)

## 2017-06-20 MED ORDER — PROCHLORPERAZINE EDISYLATE 10 MG/2ML IJ SOLN
INTRAMUSCULAR | Status: AC
  Filled 2017-06-20: qty 2

## 2017-06-20 MED ORDER — PROCHLORPERAZINE EDISYLATE 10 MG/2ML IJ SOLN
10.0000 mg | Freq: Once | INTRAMUSCULAR | Status: AC
  Administered 2017-06-20: 10 mg via INTRAVENOUS
  Filled 2017-06-20: qty 2

## 2017-06-20 MED ORDER — SODIUM CHLORIDE 0.9 % IV SOLN
Freq: Once | INTRAVENOUS | Status: AC
  Administered 2017-06-20: 12:00:00 via INTRAVENOUS

## 2017-06-20 MED ORDER — SODIUM CHLORIDE 0.9% FLUSH
10.0000 mL | INTRAVENOUS | Status: DC | PRN
  Administered 2017-06-20: 10 mL
  Filled 2017-06-20: qty 10

## 2017-06-20 MED ORDER — CYCLOPHOSPHAMIDE CHEMO INJECTION 1 GM
600.0000 mg/m2 | Freq: Once | INTRAMUSCULAR | Status: AC
  Administered 2017-06-20: 1320 mg via INTRAVENOUS
  Filled 2017-06-20: qty 66

## 2017-06-20 MED ORDER — DEXAMETHASONE SODIUM PHOSPHATE 10 MG/ML IJ SOLN
INTRAMUSCULAR | Status: AC
  Filled 2017-06-20: qty 1

## 2017-06-20 MED ORDER — FLUOROURACIL CHEMO INJECTION 2.5 GM/50ML
600.0000 mg/m2 | Freq: Once | INTRAVENOUS | Status: AC
  Administered 2017-06-20: 1300 mg via INTRAVENOUS
  Filled 2017-06-20: qty 26

## 2017-06-20 MED ORDER — METHOTREXATE SODIUM (PF) CHEMO INJECTION 250 MG/10ML
40.0000 mg/m2 | Freq: Once | INTRAMUSCULAR | Status: AC
  Administered 2017-06-20: 88 mg via INTRAVENOUS
  Filled 2017-06-20: qty 3.52

## 2017-06-20 MED ORDER — HEPARIN SOD (PORK) LOCK FLUSH 100 UNIT/ML IV SOLN
500.0000 [IU] | Freq: Once | INTRAVENOUS | Status: AC | PRN
  Administered 2017-06-20: 500 [IU]
  Filled 2017-06-20: qty 5

## 2017-06-20 MED ORDER — DEXAMETHASONE SODIUM PHOSPHATE 10 MG/ML IJ SOLN
10.0000 mg | Freq: Once | INTRAMUSCULAR | Status: AC
  Administered 2017-06-20: 10 mg via INTRAVENOUS

## 2017-06-20 MED ORDER — PROCHLORPERAZINE MALEATE 10 MG PO TABS
ORAL_TABLET | ORAL | Status: AC
  Filled 2017-06-20: qty 1

## 2017-06-20 NOTE — Patient Instructions (Signed)

## 2017-06-20 NOTE — Patient Instructions (Signed)
[facility] Discharge Instructions for Patients Receiving Chemotherapy  Today you received the following chemotherapy agents Cytoxan, Methotrexate, 5FU  To help prevent nausea and vomiting after your treatment, we encourage you to take your nausea medication as prescribed by MD.   If you develop nausea and vomiting that is not controlled by your nausea medication, call the clinic.   BELOW ARE SYMPTOMS THAT SHOULD BE REPORTED IMMEDIATELY:  *FEVER GREATER THAN 100.5 F  *CHILLS WITH OR WITHOUT FEVER  NAUSEA AND VOMITING THAT IS NOT CONTROLLED WITH YOUR NAUSEA MEDICATION  *UNUSUAL SHORTNESS OF BREATH  *UNUSUAL BRUISING OR BLEEDING  TENDERNESS IN MOUTH AND THROAT WITH OR WITHOUT PRESENCE OF ULCERS  *URINARY PROBLEMS  *BOWEL PROBLEMS  UNUSUAL RASH Items with * indicate a potential emergency and should be followed up as soon as possible.  Feel free to call the clinic should you have any questions or concerns. The clinic phone number is [phone].  Please show the CHEMO ALERT CARD at check-in to the Emergency Department and triage nurse.   

## 2017-06-27 ENCOUNTER — Telehealth: Payer: Self-pay | Admitting: Oncology

## 2017-06-27 ENCOUNTER — Inpatient Hospital Stay (HOSPITAL_BASED_OUTPATIENT_CLINIC_OR_DEPARTMENT_OTHER): Payer: Medicare Other | Admitting: Oncology

## 2017-06-27 ENCOUNTER — Inpatient Hospital Stay: Payer: Medicare Other

## 2017-06-27 VITALS — BP 122/77 | HR 69 | Temp 98.1°F | Resp 18 | Ht 69.0 in | Wt 226.1 lb

## 2017-06-27 DIAGNOSIS — Z85820 Personal history of malignant melanoma of skin: Secondary | ICD-10-CM

## 2017-06-27 DIAGNOSIS — Z170 Estrogen receptor positive status [ER+]: Secondary | ICD-10-CM | POA: Diagnosis not present

## 2017-06-27 DIAGNOSIS — R5383 Other fatigue: Secondary | ICD-10-CM | POA: Diagnosis not present

## 2017-06-27 DIAGNOSIS — Z800 Family history of malignant neoplasm of digestive organs: Secondary | ICD-10-CM

## 2017-06-27 DIAGNOSIS — R61 Generalized hyperhidrosis: Secondary | ICD-10-CM | POA: Diagnosis not present

## 2017-06-27 DIAGNOSIS — C50812 Malignant neoplasm of overlapping sites of left female breast: Secondary | ICD-10-CM | POA: Diagnosis not present

## 2017-06-27 DIAGNOSIS — Z79899 Other long term (current) drug therapy: Secondary | ICD-10-CM

## 2017-06-27 DIAGNOSIS — G479 Sleep disorder, unspecified: Secondary | ICD-10-CM

## 2017-06-27 DIAGNOSIS — Z8042 Family history of malignant neoplasm of prostate: Secondary | ICD-10-CM

## 2017-06-27 DIAGNOSIS — C50212 Malignant neoplasm of upper-inner quadrant of left female breast: Secondary | ICD-10-CM | POA: Diagnosis not present

## 2017-06-27 DIAGNOSIS — Z803 Family history of malignant neoplasm of breast: Secondary | ICD-10-CM

## 2017-06-27 LAB — CMP (CANCER CENTER ONLY)
ALT: 16 U/L (ref 0–55)
ANION GAP: 8 (ref 3–11)
AST: 14 U/L (ref 5–34)
Albumin: 4 g/dL (ref 3.5–5.0)
Alkaline Phosphatase: 86 U/L (ref 40–150)
BUN: 15 mg/dL (ref 7–26)
CHLORIDE: 107 mmol/L (ref 98–109)
CO2: 25 mmol/L (ref 22–29)
CREATININE: 0.78 mg/dL (ref 0.60–1.10)
Calcium: 9.5 mg/dL (ref 8.4–10.4)
Glucose, Bld: 104 mg/dL (ref 70–140)
POTASSIUM: 4.2 mmol/L (ref 3.5–5.1)
SODIUM: 140 mmol/L (ref 136–145)
Total Bilirubin: 0.7 mg/dL (ref 0.2–1.2)
Total Protein: 6.6 g/dL (ref 6.4–8.3)

## 2017-06-27 LAB — CBC WITH DIFFERENTIAL (CANCER CENTER ONLY)
Basophils Absolute: 0 10*3/uL (ref 0.0–0.1)
Basophils Relative: 1 %
EOS ABS: 0.1 10*3/uL (ref 0.0–0.5)
Eosinophils Relative: 4 %
HEMATOCRIT: 36.6 % (ref 34.8–46.6)
HEMOGLOBIN: 12.3 g/dL (ref 11.6–15.9)
LYMPHS PCT: 21 %
Lymphs Abs: 0.7 10*3/uL — ABNORMAL LOW (ref 0.9–3.3)
MCH: 29.6 pg (ref 25.1–34.0)
MCHC: 33.7 g/dL (ref 31.5–36.0)
MCV: 87.7 fL (ref 79.5–101.0)
MONOS PCT: 6 %
Monocytes Absolute: 0.2 10*3/uL (ref 0.1–0.9)
NEUTROS ABS: 2.2 10*3/uL (ref 1.5–6.5)
NEUTROS PCT: 68 %
Platelet Count: 232 10*3/uL (ref 145–400)
RBC: 4.18 MIL/uL (ref 3.70–5.45)
RDW: 15.1 % — ABNORMAL HIGH (ref 11.2–14.5)
WBC: 3.2 10*3/uL — AB (ref 3.9–10.3)

## 2017-06-27 NOTE — Telephone Encounter (Signed)
Per 4/30 already done

## 2017-06-27 NOTE — Progress Notes (Signed)
[place]  Telephone:([phone] Fax:(336) [phone]     ID: Brooke [name] DOB: [birth year]  MR#: [id number]  [id number]#:[id number]  Patient Care Team: Brooke Kern, DO as PCP - General (Family Medicine) Phineas Real, Brooke Block, MD (Obstetrics and Gynecology) Brooke Bookbinder, MD as Attending Physician (Dermatology) [name], Brooke Dad, MD as Consulting Physician (Oncology) Brooke Rudd, MD as Consulting Physician (Radiation Oncology) Brooke Kussmaul, MD as Consulting Physician (General Surgery) Brooke Limbo, MD as Consulting Physician (Plastic Surgery) OTHER MD:  CHIEF COMPLAINT: Estrogen receptor positive breast cancer  CURRENT TREATMENT: Neoadjuvant chemotherapy [TC then CMF]  HISTORY OF CURRENT ILLNESS: From the original intake note:  Brooke [name] herself palpated a change in her left breast and brought this to medical attention.  On 01/18/2017 she underwent bilateral diagnostic mammography with tomography and bilateral breast ultrasonography at [facility].  The breast density was category B.  At the most recent exam was from December 2016.  In the right breast upper outer quadrant there was a 1 cm irregular mass associated with punctate calcification.  By ultrasound this was not well-defined, and it was biopsied on 01/26/2017 with tomographic guidance this showed a complex sclerosing lesion with the usual ductal hyperplasia.  The right axilla was sonographically  On the left mammography showed a 4 cm dense mass with indistinct margins in the upper inner quadrant.  Ultrasound defined a 3 cm irregular mass in the upper inner quadrant of the left breast which was palpable.  There was an abnormal lymph node in the left axilla.  Biopsy of the left mass and abnormal lymph node ([id number], on 01/26/2017) showed both to be involved by invasive ductal carcinoma, with some lobular features but E-cadherin positive, estrogen and progesterone receptors both 100% positive, with strong staining  intensity, with MIB-1 between 15-20%.  There was no HER-2 amplification, the signals ratio being 1.31/1.58 and the number per cell 2.30/2.05  The patient's subsequent history is as detailed below.  Brooke [name] returns today for follow-up and treatment of her estrogen receptor positive breast cancer.  To review, she received 1 cycle of cyclophosphamide and Taxotere on 03/03/2017, which she tolerated very poorly.  She was then switched to CMF chemotherapy with a total of 7 cycles planned.    She received cycle 5 on 06/20/2017.  Rated that well.  Unlike her experience with cycle 4, she had no nausea, only minimal queasiness, although she is not taking any antiemetics.  She is eating some Brooke [name] which is what her sister told her it works better.  Since her last visit here she met with Brooke [name].  She discussed various types of reconstruction.  Brooke [name] is a little bit and shocked at how horrible things look in the photos, how difficult it all sounds, and at this point she does not know what she wants to do or may or may not want to do.  She feels she is going to need more information before before she can make a good decision.      REVIEW OF SYSTEMS: Brooke [name] is concerned that she gained a little weight.  Otherwise she continues normally active.  She is not taking Compazine because she thinks it gave her bad dreams.  She has had no unusual headaches, visual changes, nausea, vomiting, cough, phlegm production, pleurisy, or change in bowel or bladder habits.  A detailed review of systems today was otherwise stable.    PAST MEDICAL HISTORY: Past Medical History:  Diagnosis Date  . Anemia   .  Anxiety   . Arthritis   . Bilateral dry eyes   . Cancer ([place])   . Family history of breast cancer 02/02/2017  . Family history of colon cancer 02/02/2017  . Family history of prostate cancer in father 02/02/2017  . Fibroid   . History of kidney stones   . Hyperlipidemia   . Kidney stones   .  Melanoma of upper arm ([place]) 2009   RIGHT ARM ; left breast cancer, squamous cell carcinoma of the skin  . Obesity (BMI 30.0-34.9)   . Osteopenia 05/2015   T score -1.3 FRAX 6%/0.5% stable from prior DEXA  . PONV (postoperative nausea and vomiting)   . UTI (urinary tract infection)   . Varicose veins of both lower extremities   . Vitamin D deficiency 07/2009   LOW VITAMIN D 29  . Vitamin deficiency 07/2008   VITAMIN D LOW 23    PAST SURGICAL HISTORY: Past Surgical History:  Procedure Laterality Date  . ABDOMINAL SURGERY  1975   ovairan cyst   . APPENDECTOMY    . BREAST SURGERY     bio  . CHOLECYSTECTOMY    . DERMOID CYST  EXCISION    . DILATION AND CURETTAGE OF UTERUS  1998  . GALLBLADDER SURGERY    . HYSTEROSCOPY    . insertion  port a cath  03/02/2016  . PORTACATH PLACEMENT N/A 03/02/2017   Procedure: INSERTION PORT-A-CATH;  Surgeon: Brooke Kussmaul, MD;  Location: [facility];  Service: General;  Laterality: N/A;  . SKIN BIOPSY    . SKIN CANCER EXCISION  2010,2011   2010-RIGHT ARM, 2011-LEFT LOWER LEG.-DR Brooke [name] DERM. DR. Sarajane Jews IS HER DERM SURGEON.    FAMILY HISTORY Family History  Problem Relation Age of Onset  . Diabetes Father   . Hypertension Father   . Heart disease Father   . Prostate cancer Father 40  . Breast cancer Sister 43       again at age 44, GT results unk  . Skin cancer Sister   . Colon cancer Paternal Grandfather 66  . Hypertension Mother   . Heart failure Mother   The patient's father was diagnosed with prostate cancer in his 58s and died at age 79 from complications of that disease.  The patient's mother died at age 40 with congestive heart failure.  The patient had no brothers, 3 sisters.  One sister had breast cancer at age 89 and again at age 79.  She has been genetically tested and was found to be BRCA not mutated in addition a paternal grandfather had colon cancer in his 79s.  There is no history of ovarian cancer in the family to the patient's  knowledge.  GYNECOLOGIC HISTORY:  Patient's last menstrual period was 03/18/1997. Menarche age 8. The patient is GX P0.  She stopped having periods in 1999 and took hormone replacement approximately 5 years.  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 She is single and lives alone with no pets.    ADVANCED DIRECTIVES:    HEALTH MAINTENANCE: Social History   Tobacco Use  . Smoking status: Never Smoker  . Smokeless tobacco: Never Used  Substance Use Topics  . Alcohol use: No    Frequency: Never    Comment: rare  . Drug use: No     Colonoscopy: Never  PAP:  Bone density: 2017/osteopenia   Allergies  Allergen Reactions  .  Neosporin [Neomycin-Bacitracin Zn-Polymyx] Swelling, Rash and Other (See Comments)    Blisters  . Benzalkonium Chloride Other (See Comments)    Redness  . Latex Rash    Current Outpatient Medications  Medication Sig Dispense Refill  . acetaminophen (TYLENOL) 500 MG tablet Take 500 mg by mouth every 6 (six) hours as needed for moderate pain or headache.    . alclomethasone (ACLOVATE) 0.05 % cream Apply to affected area once daily as needed for dermatitis  0  . Hyprom-Naphaz-Polysorb-Zn Sulf (CLEAR EYES COMPLETE OP) Place 1-2 drops into both eyes daily as needed (for dry eyes).    Marland Kitchen ibuprofen (ADVIL,MOTRIN) 200 MG tablet Take 200 mg by mouth every 6 (six) hours as needed for headache or moderate pain.     No current facility-administered medications for this visit.     OBJECTIVE: Middle-aged white woman who appears well  Vitals:   06/27/17 0823  BP: 122/77  Pulse: 69  Resp: 18  Temp: 98.1 F (36.7 C)  SpO2: 98%     Body mass index is 33.39 kg/m.   Wt Readings from Last 3 Encounters:  06/27/17 226 lb 1.6 oz (102.6 kg)  05/30/17 222 lb 9.6 oz (101 kg)  05/09/17 219 lb 4 oz (99.5 kg)      ECOG FS:1 - Symptomatic but completely  ambulatory  Sclerae unicteric, pupils round and equal Oropharynx clear and moist No cervical or supraclavicular adenopathy Lungs no rales or rhonchi Heart regular rate and rhythm Abd soft, nontender, positive bowel sounds MSK no focal spinal tenderness, no upper extremity lymphedema Neuro: nonfocal, well oriented, appropriate affect Breasts: Deferred  LAB RESULTS:  CMP     Component Value Date/Time   NA 140 06/20/2017 0940   NA 141 03/03/2017 1358   K 3.9 06/20/2017 0940   K 3.3 (L) 03/03/2017 1358   CL 107 06/20/2017 0940   CO2 24 06/20/2017 0940   CO2 28 03/03/2017 1358   GLUCOSE 96 06/20/2017 0940   GLUCOSE 107 03/03/2017 1358   BUN 12 06/20/2017 0940   BUN 12.7 03/03/2017 1358   CREATININE 0.79 06/20/2017 0940   CREATININE 0.9 03/03/2017 1358   CALCIUM 9.4 06/20/2017 0940   CALCIUM 9.4 03/03/2017 1358   PROT 6.7 06/20/2017 0940   PROT 6.5 03/03/2017 1358   ALBUMIN 4.0 06/20/2017 0940   ALBUMIN 3.7 03/03/2017 1358   AST 25 06/20/2017 0940   AST 23 03/03/2017 1358   ALT 27 06/20/2017 0940   ALT 25 03/03/2017 1358   ALKPHOS 102 06/20/2017 0940   ALKPHOS 71 03/03/2017 1358   BILITOT 0.6 06/20/2017 0940   BILITOT 0.52 03/03/2017 1358   GFRNONAA >60 06/20/2017 0940   GFRAA >60 06/20/2017 0940    No results found for: TOTALPROTELP, ALBUMINELP, A1GS, A2GS, BETS, BETA2SER, GAMS, MSPIKE, SPEI  No results found for: Nils Pyle, [facility]  Lab Results  Component Value Date   WBC 3.2 (L) 06/27/2017   NEUTROABS 2.2 06/27/2017   HGB 12.3 06/27/2017   HCT 36.6 06/27/2017   MCV 87.7 06/27/2017   PLT 232 06/27/2017      Chemistry      Component Value Date/Time   NA 140 06/20/2017 0940   NA 141 03/03/2017 1358   K 3.9 06/20/2017 0940   K 3.3 (L) 03/03/2017 1358   CL 107 06/20/2017 0940   CO2 24 06/20/2017 0940   CO2 28 03/03/2017 1358   BUN 12 06/20/2017 0940   BUN 12.7 03/03/2017 1358   CREATININE 0.79  06/20/2017 0940   CREATININE 0.9  03/03/2017 1358      Component Value Date/Time   CALCIUM 9.4 06/20/2017 0940   CALCIUM 9.4 03/03/2017 1358   ALKPHOS 102 06/20/2017 0940   ALKPHOS 71 03/03/2017 1358   AST 25 06/20/2017 0940   AST 23 03/03/2017 1358   ALT 27 06/20/2017 0940   ALT 25 03/03/2017 1358   BILITOT 0.6 06/20/2017 0940   BILITOT 0.52 03/03/2017 1358       No results found for: LABCA2  No components found for: ZOXWRU045  No results for input(s): INR in the last 168 hours.  No results found for: LABCA2  No results found for: WUJ811  No results found for: BJY782  No results found for: NFA213  No results found for: CA2729  No components found for: HGQUANT  No results found for: CEA1 / No results found for: CEA1   No results found for: AFPTUMOR  No results found for: CHROMOGRNA  No results found for: PSA1  Appointment on 06/27/2017  Component Date Value Ref Range Status  . WBC Count 06/27/2017 3.2* 3.9 - 10.3 K/uL Final  . RBC 06/27/2017 4.18  3.70 - 5.45 MIL/uL Final  . Hemoglobin 06/27/2017 12.3  11.6 - 15.9 g/dL Final  . HCT 06/27/2017 36.6  34.8 - 46.6 % Final  . MCV 06/27/2017 87.7  79.5 - 101.0 fL Final  . MCH 06/27/2017 29.6  25.1 - 34.0 pg Final  . MCHC 06/27/2017 33.7  31.5 - 36.0 g/dL Final  . RDW 06/27/2017 15.1* 11.2 - 14.5 % Final  . Platelet Count 06/27/2017 232  145 - 400 K/uL Final  . Neutrophils Relative % 06/27/2017 68  % Final  . Neutro Abs 06/27/2017 2.2  1.5 - 6.5 K/uL Final  . Lymphocytes Relative 06/27/2017 21  % Final  . Lymphs Abs 06/27/2017 0.7* 0.9 - 3.3 K/uL Final  . Monocytes Relative 06/27/2017 6  % Final  . Monocytes Absolute 06/27/2017 0.2  0.1 - 0.9 K/uL Final  . Eosinophils Relative 06/27/2017 4  % Final  . Eosinophils Absolute 06/27/2017 0.1  0.0 - 0.5 K/uL Final  . Basophils Relative 06/27/2017 1  % Final  . Basophils Absolute 06/27/2017 0.0  0.0 - 0.1 K/uL Final   Performed at [facility], [address]  [place], [place] [address]    (this displays the last labs from the last 3 days)  No results found for: TOTALPROTELP, ALBUMINELP, A1GS, A2GS, BETS, BETA2SER, GAMS, MSPIKE, SPEI (this displays SPEP labs)  No results found for: KPAFRELGTCHN, LAMBDASER, KAPLAMBRATIO (kappa/lambda light chains)  No results found for: HGBA, HGBA2QUANT, HGBFQUANT, HGBSQUAN (Hemoglobinopathy evaluation)   No results found for: LDH  No results found for: IRON, TIBC, IRONPCTSAT (Iron and TIBC)  No results found for: FERRITIN  Urinalysis    Component Value Date/Time   COLORURINE YELLOW 05/29/2015 [place] 05/29/2015 1113   LABSPEC 1.023 05/29/2015 1113   PHURINE 5.5 05/29/2015 1113   GLUCOSEU NEGATIVE 05/29/2015 1113   GLUCOSEU NEGATIVE 11/05/2012 1559   HGBUR NEGATIVE 05/29/2015 1113   BILIRUBINUR negative 02/10/2017 1622   KETONESUR NEGATIVE 05/29/2015 1113   PROTEINUR negative 02/10/2017 1622   PROTEINUR NEGATIVE 05/29/2015 1113   UROBILINOGEN 0.2 02/10/2017 1622   UROBILINOGEN 0.2 11/05/2012 1559   NITRITE negative 02/10/2017 1622   NITRITE NEGATIVE 05/29/2015 1113   LEUKOCYTESUR Negative 02/10/2017 1622     STUDIES: No results found.  ELIGIBLE FOR AVAILABLE RESEARCH PROTOCOL: No  ASSESSMENT: 67 y.o. [place] woman status post bilateral biopsies 01/26/2017, showing  (1) in the right breast, a complex sclerosing lesion  (2) in the left breast, a cT2 pN1 invasive ductal carcinoma (with some lobular features but E-cadherin positive), grade 2, estrogen and progesterone receptor positive, HER-2 not amplified, with an MIB-1 of 15-20%  (a) breast MRI 02/04/2017 suggests a T3 N1 tumor  (3) started neoadjuvant cyclophosphamide/docetaxel 03/03/2017, discontinued after 1 cycle with very poor tolerance  (a) started cyclophosphamide/methotrexate/fluorouracil (CMF) 03/28/2017, to be repeated for 7 cycles 21 days apart  (4) definitive surgery to follow  (5) adjuvant radiation to  follow surgery  ( 6) to start antiestrogens at the completion of local treatment  PLAN: Brooke [name] has completed 5 of her 7 planned cycles of CMF.  She is tolerating them remarkably well.  She will have her final cycle on 08/01/2017 and after that of course she will have a restaging breast MRI.  She wonders if she will absolutely need a mastectomy or whether there is the possibility of keeping her breast.  She would like to discuss this with her surgeon Dr. Marlou Starks and I have alerted him that this might be a good thing to start planning for since it takes [facility] and while to make definitive decisions and she is not going to want to postpone her surgery too long after she finishes chemo.  She will return in 2 weeks for her sixth of 7 planned cycles of CMF.  I will see her again a week after that and then she will return [phone] for her final cycle  She knows to call for any other issues that may develop before then.   [name], Brooke Dad, MD  06/27/17 8:31 AM Medical Oncology and Hematology [facility] [address] [place], [place] [address] Tel. [phone]    Fax. [phone]  This document serves as a record of services personally performed by Lurline Del, MD. It was created on his behalf by Steva Colder, a trained medical scribe. The creation of this record is based on the scribe's personal observations and the provider's statements to them.   I have reviewed the above documentation for accuracy and completeness, and I agree with the above.         [place]  Telephone:(336) [phone] Fax:(336) [phone]     ID: TALYAH SEDER DOB: February 20, 1951  MR#: [id number]  FUX#:[id number]  Patient Care Team: Brooke Kern, DO as PCP - General (Family Medicine) Phineas Real, Brooke Block, MD (Obstetrics and Gynecology) Brooke Bookbinder, MD as Attending Physician (Dermatology) [name], Brooke Dad, MD as Consulting Physician (Oncology) Brooke Rudd, MD as Consulting  Physician (Radiation Oncology) Brooke Kussmaul, MD as Consulting Physician (General Surgery) Brooke Limbo, MD as Consulting Physician (Plastic Surgery) OTHER MD:  CHIEF COMPLAINT: Estrogen receptor positive breast cancer  CURRENT TREATMENT: Neoadjuvant chemotherapy [TC then CMF]  HISTORY OF CURRENT ILLNESS: From the original intake note:  Brooke [name] herself palpated a change in her left breast and brought this to medical attention.  On 01/18/2017 she underwent bilateral diagnostic mammography with tomography and bilateral breast ultrasonography at [facility].  The breast density was category B.  At the most recent exam was from December 2016.  In the right breast upper outer quadrant there was a 1 cm irregular mass associated with punctate calcification.  By ultrasound this was not well-defined, and it was biopsied on 01/26/2017 with tomographic guidance this showed a complex sclerosing lesion with the usual ductal hyperplasia.  The right axilla was sonographically  On the left mammography showed a 4 cm dense mass with indistinct margins in the upper inner quadrant.  Ultrasound defined a 3 cm irregular mass in the upper inner quadrant of the left breast which was palpable.  There was an abnormal lymph node in the left axilla.  Biopsy of the left mass and abnormal lymph node ([id number], on 01/26/2017) showed both to be involved by invasive ductal carcinoma, with some lobular features but E-cadherin positive, estrogen and progesterone receptors both 100% positive, with strong staining intensity, with MIB-1 between 15-20%.  There was no HER-2 amplification, the signals ratio being 1.31/1.58 and the number per cell 2.30/2.05  The patient's subsequent history is as detailed below.  [place] returns today for follow up and treatment of her estrogen receptor positive breast cancer. She receives CMF treatment every 21 days with a total of 7 doses planned. Today is day 1 cycle 4. She reports  that her last cycle went "okay" and she doesn't have any after effects following the last cycle of chemotherapy.       REVIEW OF SYSTEMS: Brooke [name] reports that for exercise, that she has not been exercising as she would like to recently. She is currently on the fence regarding breast reconstruction and would like an appointment with the specialist. She is on the fence due to thinking how the surgery will affect her in the long-term. She has a subtle softening to her left side that she has noticed. She reports altered sleep pattern that is she unsure on what to contribute it too, whether it be the chemo or being unable to "shut her brain down". She has night sweats sometimes and she has a lot of interrupted sleep. She has mild fatigue. She denies unusual headaches, visual changes, nausea, vomiting, or dizziness. There has been no unusual cough, phlegm production, or pleurisy. This been no change in bowel or bladder habits. She denies unexplained fatigue or unexplained weight loss, bleeding, rash, or fever. A detailed review of systems was otherwise stable.       PAST MEDICAL HISTORY: Past Medical History:  Diagnosis Date  . Anemia   . Anxiety   . Arthritis   . Bilateral dry eyes   . Cancer ([place])   . Family history of breast cancer 02/02/2017  . Family history of colon cancer 02/02/2017  . Family history of prostate cancer in father 02/02/2017  . Fibroid   . History of kidney stones   . Hyperlipidemia   . Kidney stones   . Melanoma of upper arm ([place]) 2009   RIGHT ARM ; left breast cancer, squamous cell carcinoma of the skin  . Obesity (BMI 30.0-34.9)   . Osteopenia 05/2015   T score -1.3 FRAX 6%/0.5% stable from prior DEXA  . PONV (postoperative nausea and vomiting)   . UTI (urinary tract infection)   . Varicose veins of both lower extremities   . Vitamin D deficiency 07/2009   LOW VITAMIN D 29  . Vitamin deficiency 07/2008   VITAMIN D LOW 23    PAST SURGICAL HISTORY: Past Surgical  History:  Procedure Laterality Date  . ABDOMINAL SURGERY  1975   ovairan cyst   . APPENDECTOMY    . BREAST SURGERY     bio  . CHOLECYSTECTOMY    . DERMOID CYST  EXCISION    . DILATION AND CURETTAGE OF UTERUS  1998  . GALLBLADDER SURGERY    . HYSTEROSCOPY    .  insertion  port a cath  03/02/2016  . PORTACATH PLACEMENT N/A 03/02/2017   Procedure: INSERTION PORT-A-CATH;  Surgeon: Brooke Kussmaul, MD;  Location: [facility];  Service: General;  Laterality: N/A;  . SKIN BIOPSY    . SKIN CANCER EXCISION  2010,2011   2010-RIGHT ARM, 2011-LEFT LOWER LEG.-DR Brooke [name] DERM. DR. Sarajane Jews IS HER DERM SURGEON.    FAMILY HISTORY Family History  Problem Relation Age of Onset  . Diabetes Father   . Hypertension Father   . Heart disease Father   . Prostate cancer Father 52  . Breast cancer Sister 33       again at age 64, GT results unk  . Skin cancer Sister   . Colon cancer Paternal Grandfather 35  . Hypertension Mother   . Heart failure Mother   The patient's father was diagnosed with prostate cancer in his 64s and died at age 61 from complications of that disease.  The patient's mother died at age 42 with congestive heart failure.  The patient had no brothers, 3 sisters.  One sister had breast cancer at age 25 and again at age 35.  She has been genetically tested and was found to be BRCA not mutated in addition a paternal grandfather had colon cancer in his 68s.  There is no history of ovarian cancer in the family to the patient's knowledge.  GYNECOLOGIC HISTORY:  Patient's last menstrual period was 03/18/1997. Menarche age 16. The patient is GX P0.  She stopped having periods in 1999 and took hormone replacement approximately 5 years.  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 She is single and lives alone with no pets.    ADVANCED DIRECTIVES:    HEALTH MAINTENANCE: Social  History   Tobacco Use  . Smoking status: Never Smoker  . Smokeless tobacco: Never Used  Substance Use Topics  . Alcohol use: No    Frequency: Never    Comment: rare  . Drug use: No     Colonoscopy: Never  PAP:  Bone density: 2017/osteopenia   Allergies  Allergen Reactions  . Neosporin [Neomycin-Bacitracin Zn-Polymyx] Swelling, Rash and Other (See Comments)    Blisters  . Benzalkonium Chloride Other (See Comments)    Redness  . Latex Rash    Current Outpatient Medications  Medication Sig Dispense Refill  . acetaminophen (TYLENOL) 500 MG tablet Take 500 mg by mouth every 6 (six) hours as needed for moderate pain or headache.    . alclomethasone (ACLOVATE) 0.05 % cream Apply to affected area once daily as needed for dermatitis  0  . Hyprom-Naphaz-Polysorb-Zn Sulf (CLEAR EYES COMPLETE OP) Place 1-2 drops into both eyes daily as needed (for dry eyes).    Marland Kitchen ibuprofen (ADVIL,MOTRIN) 200 MG tablet Take 200 mg by mouth every 6 (six) hours as needed for headache or moderate pain.     No current facility-administered medications for this visit.     OBJECTIVE: Middle-aged white woman in no acute distress  Vitals:   06/27/17 0823  BP: 122/77  Pulse: 69  Resp: 18  Temp: 98.1 F (36.7 C)  SpO2: 98%     Body mass index is 33.39 kg/m.   Wt Readings from Last 3 Encounters:  06/27/17 226 lb 1.6 oz (102.6 kg)  05/30/17 222 lb 9.6 oz (101 kg)  05/09/17 219 lb 4 oz (99.5 kg)      ECOG FS:1 -  Symptomatic but completely ambulatory  Sclerae unicteric, EOMs intact Oropharynx clear and moist No cervical or supraclavicular adenopathy Lungs no rales or rhonchi Heart regular rate and rhythm Abd soft, nontender, positive bowel sounds MSK no focal spinal tenderness, no upper extremity lymphedema Neuro: nonfocal, well oriented, appropriate affect Breasts: Right breast is benign.  The mass in the left breast upper inner quadrant, more or less centrally, is still palpable.  It is movable.   It measures about 3 cm by palpation.  It is nontender.  Both axillae are benign.   LAB RESULTS:  CMP     Component Value Date/Time   NA 140 06/20/2017 0940   NA 141 03/03/2017 1358   K 3.9 06/20/2017 0940   K 3.3 (L) 03/03/2017 1358   CL 107 06/20/2017 0940   CO2 24 06/20/2017 0940   CO2 28 03/03/2017 1358   GLUCOSE 96 06/20/2017 0940   GLUCOSE 107 03/03/2017 1358   BUN 12 06/20/2017 0940   BUN 12.7 03/03/2017 1358   CREATININE 0.79 06/20/2017 0940   CREATININE 0.9 03/03/2017 1358   CALCIUM 9.4 06/20/2017 0940   CALCIUM 9.4 03/03/2017 1358   PROT 6.7 06/20/2017 0940   PROT 6.5 03/03/2017 1358   ALBUMIN 4.0 06/20/2017 0940   ALBUMIN 3.7 03/03/2017 1358   AST 25 06/20/2017 0940   AST 23 03/03/2017 1358   ALT 27 06/20/2017 0940   ALT 25 03/03/2017 1358   ALKPHOS 102 06/20/2017 0940   ALKPHOS 71 03/03/2017 1358   BILITOT 0.6 06/20/2017 0940   BILITOT 0.52 03/03/2017 1358   GFRNONAA >60 06/20/2017 0940   GFRAA >60 06/20/2017 0940    No results found for: TOTALPROTELP, ALBUMINELP, A1GS, A2GS, BETS, BETA2SER, GAMS, MSPIKE, SPEI  No results found for: Nils Pyle, [facility]  Lab Results  Component Value Date   WBC 3.2 (L) 06/27/2017   NEUTROABS 2.2 06/27/2017   HGB 12.3 06/27/2017   HCT 36.6 06/27/2017   MCV 87.7 06/27/2017   PLT 232 06/27/2017      Chemistry      Component Value Date/Time   NA 140 06/20/2017 0940   NA 141 03/03/2017 1358   K 3.9 06/20/2017 0940   K 3.3 (L) 03/03/2017 1358   CL 107 06/20/2017 0940   CO2 24 06/20/2017 0940   CO2 28 03/03/2017 1358   BUN 12 06/20/2017 0940   BUN 12.7 03/03/2017 1358   CREATININE 0.79 06/20/2017 0940   CREATININE 0.9 03/03/2017 1358      Component Value Date/Time   CALCIUM 9.4 06/20/2017 0940   CALCIUM 9.4 03/03/2017 1358   ALKPHOS 102 06/20/2017 0940   ALKPHOS 71 03/03/2017 1358   AST 25 06/20/2017 0940   AST 23 03/03/2017 1358   ALT 27 06/20/2017 0940   ALT 25 03/03/2017 1358    BILITOT 0.6 06/20/2017 0940   BILITOT 0.52 03/03/2017 1358       No results found for: LABCA2  No components found for: [id number]  No results for input(s): INR in the last 168 hours.  No results found for: LABCA2  No results found for: YSA630  No results found for: ZSW109  No results found for: NAT557  No results found for: CA2729  No components found for: HGQUANT  No results found for: CEA1 / No results found for: CEA1   No results found for: AFPTUMOR  No results found for: CHROMOGRNA  No results found for: PSA1  Appointment on 06/27/2017  Component Date Value Ref Range Status  .  WBC Count 06/27/2017 3.2* 3.9 - 10.3 K/uL Final  . RBC 06/27/2017 4.18  3.70 - 5.45 MIL/uL Final  . Hemoglobin 06/27/2017 12.3  11.6 - 15.9 g/dL Final  . HCT 06/27/2017 36.6  34.8 - 46.6 % Final  . MCV 06/27/2017 87.7  79.5 - 101.0 fL Final  . MCH 06/27/2017 29.6  25.1 - 34.0 pg Final  . MCHC 06/27/2017 33.7  31.5 - 36.0 g/dL Final  . RDW 06/27/2017 15.1* 11.2 - 14.5 % Final  . Platelet Count 06/27/2017 232  145 - 400 K/uL Final  . Neutrophils Relative % 06/27/2017 68  % Final  . Neutro Abs 06/27/2017 2.2  1.5 - 6.5 K/uL Final  . Lymphocytes Relative 06/27/2017 21  % Final  . Lymphs Abs 06/27/2017 0.7* 0.9 - 3.3 K/uL Final  . Monocytes Relative 06/27/2017 6  % Final  . Monocytes Absolute 06/27/2017 0.2  0.1 - 0.9 K/uL Final  . Eosinophils Relative 06/27/2017 4  % Final  . Eosinophils Absolute 06/27/2017 0.1  0.0 - 0.5 K/uL Final  . Basophils Relative 06/27/2017 1  % Final  . Basophils Absolute 06/27/2017 0.0  0.0 - 0.1 K/uL Final   Performed at [facility], [address] [place], [place] [address]    (this displays the last labs from the last 3 days)  No results found for: TOTALPROTELP, ALBUMINELP, A1GS, A2GS, BETS, BETA2SER, GAMS, MSPIKE, SPEI (this displays SPEP labs)  No results found for: KPAFRELGTCHN, LAMBDASER, KAPLAMBRATIO (kappa/lambda light  chains)  No results found for: HGBA, HGBA2QUANT, HGBFQUANT, HGBSQUAN (Hemoglobinopathy evaluation)   No results found for: LDH  No results found for: IRON, TIBC, IRONPCTSAT (Iron and TIBC)  No results found for: FERRITIN  Urinalysis    Component Value Date/Time   COLORURINE YELLOW 05/29/2015 [place] 05/29/2015 1113   LABSPEC 1.023 05/29/2015 1113   PHURINE 5.5 05/29/2015 1113   GLUCOSEU NEGATIVE 05/29/2015 1113   GLUCOSEU NEGATIVE 11/05/2012 1559   HGBUR NEGATIVE 05/29/2015 1113   BILIRUBINUR negative 02/10/2017 1622   KETONESUR NEGATIVE 05/29/2015 1113   PROTEINUR negative 02/10/2017 1622   PROTEINUR NEGATIVE 05/29/2015 1113   UROBILINOGEN 0.2 02/10/2017 1622   UROBILINOGEN 0.2 11/05/2012 1559   NITRITE negative 02/10/2017 1622   NITRITE NEGATIVE 05/29/2015 1113   LEUKOCYTESUR Negative 02/10/2017 1622     STUDIES: No results found.  ELIGIBLE FOR AVAILABLE RESEARCH PROTOCOL: No  ASSESSMENT: 67 y.o. [place] woman status post bilateral biopsies 01/26/2017, showing  (1) in the right breast, a complex sclerosing lesion  (2) in the left breast, a cT2 pN1 invasive ductal carcinoma (with some lobular features but E-cadherin positive), grade 2, estrogen and progesterone receptor positive, HER-2 not amplified, with an MIB-1 of 15-20%  (a) breast MRI 02/04/2017 suggests a T3 N1 tumor  (3) started neoadjuvant cyclophosphamide/docetaxel 03/03/2017, discontinued after 1 cycle with very poor tolerance  (a) started cyclophosphamide/methotrexate/fluorouracil (CMF) 03/28/2017, to be repeated for 7 cycles 21 days apart  (4) definitive surgery to follow  (5) adjuvant radiation to follow surgery  ( 6) to start antiestrogens at the completion of local treatment  PLAN: Paxtyn is tolerating CMF remarkably well and she has 3 cycles to go before she can proceed to definitive surgery.  She is beginning to consider reconstruction and we are referring her to Dr.  Iran [name] so she can discuss options and start planning if she does decide to do that which type of definitive surgery she might have.  I reassured her  that even though the mass in the left breast is still palpable, the main purpose of chemotherapy is to sterilize it the bodies possible microscopic tumor deposits.  Chemotherapy generally reduces that risk by about one third and of course her antiestrogens eventually will reduce that risk by an additional half.  I have encouraged her to exercise.  She is having some sleep disturbance but at this point I am not starting either venlafaxine or gabapentin.  We can discuss that at the next visit depending on how she is doing.  She will return for treatment on 06/20/2017.  I will see her a week later.  She knows to call for any other issues that may develop before then.       [name], Brooke Dad, MD  06/27/17 8:53 AM Medical Oncology and Hematology [facility] [address] [place], [place] [address] Tel. [phone]    Fax. [phone]  This document serves as a record of services personally performed by Lurline Del, MD. It was created on his behalf by Steva Colder, a trained medical scribe. The creation of this record is based on the scribe's personal observations and the provider's statements to them.   I have reviewed the above documentation for accuracy and completeness, and I agree with the above.

## 2017-07-10 ENCOUNTER — Telehealth: Payer: Self-pay

## 2017-07-10 NOTE — Telephone Encounter (Signed)
Patient reports large reddened areas to anterior and medial left lower leg. Patient states she would not call it a rash; however, area is red, "a little warm", and itchy. Patient reports anterior area is approximately "4-5 inches wide and 6 inches tall." and medial area is " 3 inches  wide and 2 inches tall." Patient reports lesions have been present for "about a week or so"; however, have changed in "the way they feel in the last 36 hours". Denies any fever, chills, shortness of breath, or bleeding. Areas are not opened. Patient does not believe the areas to be the result of insect bites. Patient states she has an appointment tomorrow and would like someone to take a look at it. Please follow-up.

## 2017-07-11 ENCOUNTER — Other Ambulatory Visit: Payer: Self-pay | Admitting: *Deleted

## 2017-07-11 ENCOUNTER — Inpatient Hospital Stay: Payer: Medicare Other

## 2017-07-11 ENCOUNTER — Other Ambulatory Visit: Payer: Self-pay | Admitting: Medical

## 2017-07-11 ENCOUNTER — Inpatient Hospital Stay (HOSPITAL_BASED_OUTPATIENT_CLINIC_OR_DEPARTMENT_OTHER): Payer: Medicare Other | Admitting: Medical

## 2017-07-11 ENCOUNTER — Other Ambulatory Visit: Payer: Self-pay

## 2017-07-11 ENCOUNTER — Inpatient Hospital Stay: Payer: Medicare Other | Attending: Oncology

## 2017-07-11 VITALS — BP 153/71 | HR 85 | Temp 99.0°F | Resp 16

## 2017-07-11 DIAGNOSIS — C50812 Malignant neoplasm of overlapping sites of left female breast: Secondary | ICD-10-CM | POA: Diagnosis not present

## 2017-07-11 DIAGNOSIS — Z170 Estrogen receptor positive status [ER+]: Secondary | ICD-10-CM

## 2017-07-11 DIAGNOSIS — Z8042 Family history of malignant neoplasm of prostate: Secondary | ICD-10-CM | POA: Diagnosis not present

## 2017-07-11 DIAGNOSIS — M8580 Other specified disorders of bone density and structure, unspecified site: Secondary | ICD-10-CM | POA: Diagnosis not present

## 2017-07-11 DIAGNOSIS — Z803 Family history of malignant neoplasm of breast: Secondary | ICD-10-CM | POA: Diagnosis not present

## 2017-07-11 DIAGNOSIS — M1990 Unspecified osteoarthritis, unspecified site: Secondary | ICD-10-CM | POA: Diagnosis not present

## 2017-07-11 DIAGNOSIS — Z85820 Personal history of malignant melanoma of skin: Secondary | ICD-10-CM

## 2017-07-11 DIAGNOSIS — Z85828 Personal history of other malignant neoplasm of skin: Secondary | ICD-10-CM | POA: Diagnosis not present

## 2017-07-11 DIAGNOSIS — L03116 Cellulitis of left lower limb: Secondary | ICD-10-CM | POA: Diagnosis not present

## 2017-07-11 DIAGNOSIS — Z800 Family history of malignant neoplasm of digestive organs: Secondary | ICD-10-CM | POA: Diagnosis not present

## 2017-07-11 DIAGNOSIS — Z79899 Other long term (current) drug therapy: Secondary | ICD-10-CM | POA: Insufficient documentation

## 2017-07-11 DIAGNOSIS — Z5111 Encounter for antineoplastic chemotherapy: Secondary | ICD-10-CM | POA: Diagnosis not present

## 2017-07-11 DIAGNOSIS — C50212 Malignant neoplasm of upper-inner quadrant of left female breast: Secondary | ICD-10-CM

## 2017-07-11 DIAGNOSIS — Z95828 Presence of other vascular implants and grafts: Secondary | ICD-10-CM

## 2017-07-11 DIAGNOSIS — L309 Dermatitis, unspecified: Secondary | ICD-10-CM | POA: Diagnosis not present

## 2017-07-11 DIAGNOSIS — E785 Hyperlipidemia, unspecified: Secondary | ICD-10-CM | POA: Diagnosis not present

## 2017-07-11 LAB — CMP (CANCER CENTER ONLY)
ALBUMIN: 4.1 g/dL (ref 3.5–5.0)
ALT: 19 U/L (ref 0–55)
AST: 17 U/L (ref 5–34)
Alkaline Phosphatase: 88 U/L (ref 40–150)
Anion gap: 7 (ref 3–11)
BILIRUBIN TOTAL: 0.6 mg/dL (ref 0.2–1.2)
BUN: 16 mg/dL (ref 7–26)
CHLORIDE: 107 mmol/L (ref 98–109)
CO2: 26 mmol/L (ref 22–29)
Calcium: 9.7 mg/dL (ref 8.4–10.4)
Creatinine: 0.79 mg/dL (ref 0.60–1.10)
GFR, Est AFR Am: >60 mL/min (ref >60)
GFR, Estimated: >60 mL/min (ref >60)
GLUCOSE: 90 mg/dL (ref 70–140)
POTASSIUM: 4.2 mmol/L (ref 3.5–5.1)
Sodium: 140 mmol/L (ref 136–145)
TOTAL PROTEIN: 6.9 g/dL (ref 6.4–8.3)

## 2017-07-11 LAB — CBC WITH DIFFERENTIAL (CANCER CENTER ONLY)
Basophils Absolute: 0 10*3/uL (ref 0.0–0.1)
Basophils Relative: 1 %
EOS ABS: 0.1 10*3/uL (ref 0.0–0.5)
Eosinophils Relative: 2 %
HEMATOCRIT: 37.2 % (ref 34.8–46.6)
HEMOGLOBIN: 12.1 g/dL (ref 11.6–15.9)
LYMPHS ABS: 0.8 10*3/uL — AB (ref 0.9–3.3)
LYMPHS PCT: 25 %
MCH: 29.5 pg (ref 25.1–34.0)
MCHC: 32.5 g/dL (ref 31.5–36.0)
MCV: 90.7 fL (ref 79.5–101.0)
MONOS PCT: 15 %
Monocytes Absolute: 0.5 10*3/uL (ref 0.1–0.9)
NEUTROS ABS: 1.9 10*3/uL (ref 1.5–6.5)
NEUTROS PCT: 57 %
Platelet Count: 239 10*3/uL (ref 145–400)
RBC: 4.1 MIL/uL (ref 3.70–5.45)
RDW: 14.3 % (ref 11.2–14.5)
WBC: 3.3 10*3/uL — AB (ref 3.9–10.3)

## 2017-07-11 MED ORDER — DEXAMETHASONE SODIUM PHOSPHATE 10 MG/ML IJ SOLN
10.0000 mg | Freq: Once | INTRAMUSCULAR | Status: AC
  Administered 2017-07-11: 10 mg via INTRAVENOUS

## 2017-07-11 MED ORDER — CEPHALEXIN 500 MG PO CAPS: 500.0000 mg | ORAL_CAPSULE | Freq: Three times a day (TID) | ORAL | 0 refills | Status: DC

## 2017-07-11 MED ORDER — SODIUM CHLORIDE 0.9 % IV SOLN
Freq: Once | INTRAVENOUS | Status: AC
  Administered 2017-07-11: 11:00:00 via INTRAVENOUS

## 2017-07-11 MED ORDER — PROCHLORPERAZINE EDISYLATE 10 MG/2ML IJ SOLN
INTRAMUSCULAR | Status: AC
  Filled 2017-07-11: qty 2

## 2017-07-11 MED ORDER — SODIUM CHLORIDE 0.9% FLUSH
10.0000 mL | INTRAVENOUS | Status: DC | PRN
Start: 2017-07-11 — End: 2017-07-11
  Administered 2017-07-11: 10 mL
  Filled 2017-07-11: qty 10

## 2017-07-11 MED ORDER — METHOTREXATE SODIUM (PF) CHEMO INJECTION 250 MG/10ML
40.0000 mg/m2 | Freq: Once | INTRAMUSCULAR | Status: AC
  Administered 2017-07-11: 88 mg via INTRAVENOUS
  Filled 2017-07-11: qty 3.52

## 2017-07-11 MED ORDER — FLUOROURACIL CHEMO INJECTION 2.5 GM/50ML
600.0000 mg/m2 | Freq: Once | INTRAVENOUS | Status: AC
  Administered 2017-07-11: 1300 mg via INTRAVENOUS
  Filled 2017-07-11: qty 26

## 2017-07-11 MED ORDER — SODIUM CHLORIDE 0.9 % IV SOLN
600.0000 mg/m2 | Freq: Once | INTRAVENOUS | Status: AC
  Administered 2017-07-11: 1320 mg via INTRAVENOUS
  Filled 2017-07-11: qty 66

## 2017-07-11 MED ORDER — SODIUM CHLORIDE 0.9% FLUSH
10.0000 mL | INTRAVENOUS | Status: DC | PRN
  Administered 2017-07-11: 10 mL
  Filled 2017-07-11: qty 10

## 2017-07-11 MED ORDER — PROCHLORPERAZINE MALEATE 10 MG PO TABS
ORAL_TABLET | ORAL | Status: AC
Start: 2017-07-11 — End: ?
  Filled 2017-07-11: qty 1

## 2017-07-11 MED ORDER — HEPARIN SOD (PORK) LOCK FLUSH 100 UNIT/ML IV SOLN
500.0000 [IU] | Freq: Once | INTRAVENOUS | Status: AC | PRN
  Administered 2017-07-11: 500 [IU]
  Filled 2017-07-11: qty 5

## 2017-07-11 MED ORDER — DEXAMETHASONE SODIUM PHOSPHATE 10 MG/ML IJ SOLN
INTRAMUSCULAR | Status: AC
Start: 2017-07-11 — End: ?
  Filled 2017-07-11: qty 1

## 2017-07-11 MED ORDER — PROCHLORPERAZINE EDISYLATE 10 MG/2ML IJ SOLN
10.0000 mg | Freq: Once | INTRAMUSCULAR | Status: AC
  Administered 2017-07-11: 10 mg via INTRAVENOUS
  Filled 2017-07-11: qty 2

## 2017-07-11 NOTE — Progress Notes (Signed)
Patient with redness to LLE.  Area is warm to touch.  Patient denies pain, however, stated, "it feels like it is flared up."  VSS.  Sandi Mealy, PA-C assessed patient in infusion area.

## 2017-07-11 NOTE — Patient Instructions (Signed)
[facility] Discharge Instructions for Patients Receiving Chemotherapy  Today you received the following chemotherapy agents Cytoxan, Methotrexate, and 5FU  To help prevent nausea and vomiting after your treatment, we encourage you to take your nausea medication as directed   If you develop nausea and vomiting that is not controlled by your nausea medication, call the clinic.   BELOW ARE SYMPTOMS THAT SHOULD BE REPORTED IMMEDIATELY:  *FEVER GREATER THAN 100.5 F  *CHILLS WITH OR WITHOUT FEVER  NAUSEA AND VOMITING THAT IS NOT CONTROLLED WITH YOUR NAUSEA MEDICATION  *UNUSUAL SHORTNESS OF BREATH  *UNUSUAL BRUISING OR BLEEDING  TENDERNESS IN MOUTH AND THROAT WITH OR WITHOUT PRESENCE OF ULCERS  *URINARY PROBLEMS  *BOWEL PROBLEMS  UNUSUAL RASH Items with * indicate a potential emergency and should be followed up as soon as possible.  Feel free to call the clinic should you have any questions or concerns. The clinic phone number is [phone].  Please show the CHEMO ALERT CARD at check-in to the Emergency Department and triage nurse.   

## 2017-07-12 NOTE — Progress Notes (Signed)
[facility]pt[facility] Clinic Progress Note   Brooke [name] [id number] 11-18-1950 67 y.o.  Blair Heys is managed by Dr. [name]  Actively treated with chemotherapy: yes  Current Therapy: CMF  Last Treated: 07/11/2017 (cycle 6, day 1)  Assessment: Plan:    Cellulitis of left lower extremity   Cellulitis of the anterior left lower extremity: Patient was given a prescription for Keflex 500 mg p.o. 3 times daily x7 days.  Please see After Visit Summary for patient specific instructions.  Future Appointments  Date Time Provider [place]  07/18/2017 12:00 PM [facility] LAB 1 [facility] None  07/18/2017 12:30 PM Magrinat, Virgie Dad, MD [facility] None  08/01/2017 10:00 AM [facility] LAB 3 [facility] None  08/01/2017 10:15 AM [facility] FLUSH NURSE [facility] None  08/01/2017 11:15 AM [facility] B4 [facility] None    No orders of the defined types were placed in this encounter.      Subjective:   Patient ID:  Brooke [name] is a 67 y.o. (DOB [birth year]) female.  Chief Complaint: No chief complaint on file.   HPI Brooke [name] is a 67 year old female with a diagnosis of an ER positive breast cancer who was treated with TC and is now being treated with CMF neoadjuvant only.  I was asked to see the patient in the infusion room when she reported having a rash over her left anterior shin.  She has a history of rashes for which she had been given Aclovate by her dermatologist.  She has been using Aclovate over the area on her left lower extremity but was concerned today as the area was slightly more erythematous and showed increased warmth.  The patient denies fevers, chills, or sweats.  She denies any recent trauma.  Medications: I have reviewed the patient's current medications.  Allergies:  Allergies  Allergen Reactions  . Neosporin [Neomycin-Bacitracin Zn-Polymyx] Swelling, Rash and Other (See Comments)    Blisters  . Benzalkonium Chloride Other (See  Comments)    Redness  . Latex Rash    Past Medical History:  Diagnosis Date  . Anemia   . Anxiety   . Arthritis   . Bilateral dry eyes   . Cancer ([place])   . Family history of breast cancer 02/02/2017  . Family history of colon cancer 02/02/2017  . Family history of prostate cancer in father 02/02/2017  . Fibroid   . History of kidney stones   . Hyperlipidemia   . Kidney stones   . Melanoma of upper arm ([place]) 2009   RIGHT ARM ; left breast cancer, squamous cell carcinoma of the skin  . Obesity (BMI 30.0-34.9)   . Osteopenia 05/2015   T score -1.3 FRAX 6%/0.5% stable from prior DEXA  . PONV (postoperative nausea and vomiting)   . UTI (urinary tract infection)   . Varicose veins of both lower extremities   . Vitamin D deficiency 07/2009   LOW VITAMIN D 29  . Vitamin deficiency 07/2008   VITAMIN D LOW 23    Past Surgical History:  Procedure Laterality Date  . ABDOMINAL SURGERY  1975   ovairan cyst   . APPENDECTOMY    . BREAST SURGERY     bio  . CHOLECYSTECTOMY    . DERMOID CYST  EXCISION    . DILATION AND CURETTAGE OF UTERUS  1998  . GALLBLADDER SURGERY    . HYSTEROSCOPY    . insertion  port a cath  03/02/2016  . PORTACATH PLACEMENT N/A 03/02/2017  Procedure: INSERTION PORT-A-CATH;  Surgeon: Jovita Kussmaul, MD;  Location: [place];  Service: General;  Laterality: N/A;  . SKIN BIOPSY    . SKIN CANCER EXCISION  2010,2011   2010-RIGHT ARM, 2011-LEFT LOWER LEG.-DR Rolm Bookbinder DERM. DR. Sarajane Jews IS HER DERM SURGEON.    Family History  Problem Relation Age of Onset  . Diabetes Father   . Hypertension Father   . Heart disease Father   . Prostate cancer Father 47  . Breast cancer Sister 11       again at age 12, GT results unk  . Skin cancer Sister   . Colon cancer Paternal Grandfather 84  . Hypertension Mother   . Heart failure Mother     Social History   Socioeconomic History  . Marital status: Single    Spouse name: Not on file  . Number of children: Not on file    . Years of education: Not on file  . Highest education level: Not on file  Occupational History  . Not on file  Social Needs  . Financial resource strain: Not on file  . Food insecurity:    Worry: Not on file    Inability: Not on file  . Transportation needs:    Medical: Not on file    Non-medical: Not on file  Tobacco Use  . Smoking status: Never Smoker  . Smokeless tobacco: Never Used  Substance and Sexual Activity  . Alcohol use: No    Frequency: Never    Comment: rare  . Drug use: No  . Sexual activity: Never    Birth control/protection: Post-menopausal  Lifestyle  . Physical activity:    Days per week: Not on file    Minutes per session: Not on file  . Stress: Not on file  Relationships  . Social connections:    Talks on phone: Not on file    Gets together: Not on file    Attends religious service: Not on file    Active member of club or organization: Not on file    Attends meetings of clubs or organizations: Not on file    Relationship status: Not on file  . Intimate partner violence:    Fear of current or ex partner: Not on file    Emotionally abused: Not on file    Physically abused: Not on file    Forced sexual activity: Not on file  Other Topics Concern  . Not on file  Social History Narrative  . Not on file    Past Medical History, Surgical history, Social history, and Family history were reviewed and updated as appropriate.   Please see review of systems for further details on the patient's review from today.   Review of Systems:  Review of Systems  Constitutional: Negative for diaphoresis.  HENT: Negative for trouble swallowing.   Respiratory: Negative for cough, choking, chest tightness, shortness of breath and wheezing.   Cardiovascular: Negative for chest pain and palpitations.  Gastrointestinal: Negative for nausea and vomiting.  Musculoskeletal: Negative for back pain.  Skin: Positive for color change and rash. Negative for wound.   Neurological: Negative for dizziness, light-headedness and headaches.    Objective:   Physical Exam:  LMP 03/18/1997  ECOG: 0  Physical Exam  Constitutional: No distress.  Neurological: She is alert.  Skin: Rash noted. She is not diaphoretic. There is erythema.     Psychiatric: She has a normal mood and affect. Her behavior is normal. Judgment and thought content  normal.    Lab Review:     Component Value Date/Time   NA 140 07/11/2017 1040   NA 141 03/03/2017 1358   K 4.2 07/11/2017 1040   K 3.3 (L) 03/03/2017 1358   CL 107 07/11/2017 1040   CO2 26 07/11/2017 1040   CO2 28 03/03/2017 1358   GLUCOSE 90 07/11/2017 1040   GLUCOSE 107 03/03/2017 1358   BUN 16 07/11/2017 1040   BUN 12.7 03/03/2017 1358   CREATININE 0.79 07/11/2017 1040   CREATININE 0.9 03/03/2017 1358   CALCIUM 9.7 07/11/2017 1040   CALCIUM 9.4 03/03/2017 1358   PROT 6.9 07/11/2017 1040   PROT 6.5 03/03/2017 1358   ALBUMIN 4.1 07/11/2017 1040   ALBUMIN 3.7 03/03/2017 1358   AST 17 07/11/2017 1040   AST 23 03/03/2017 1358   ALT 19 07/11/2017 1040   ALT 25 03/03/2017 1358   ALKPHOS 88 07/11/2017 1040   ALKPHOS 71 03/03/2017 1358   BILITOT 0.6 07/11/2017 1040   BILITOT 0.52 03/03/2017 1358   GFRNONAA >60 07/11/2017 1040   GFRAA >60 07/11/2017 1040       Component Value Date/Time   WBC 3.3 (L) 07/11/2017 1005   WBC 22.5 (H) 03/13/2017 1208   RBC 4.10 07/11/2017 1005   HGB 12.1 07/11/2017 1005   HGB 13.0 03/03/2017 1359   HCT 37.2 07/11/2017 1005   HCT 39.7 03/03/2017 1359   PLT 239 07/11/2017 1005   PLT 281 03/03/2017 1359   MCV 90.7 07/11/2017 1005   MCV 89.6 03/03/2017 1359   MCH 29.5 07/11/2017 1005   MCHC 32.5 07/11/2017 1005   RDW 14.3 07/11/2017 1005   RDW 13.2 03/03/2017 1359   LYMPHSABS 0.8 (L) 07/11/2017 1005   LYMPHSABS 2.4 03/03/2017 1359   MONOABS 0.5 07/11/2017 1005   MONOABS 0.7 03/03/2017 1359   EOSABS 0.1 07/11/2017 1005   EOSABS 0.1 03/03/2017 1359   BASOSABS 0.0  07/11/2017 1005   BASOSABS 0.0 03/03/2017 1359   -------------------------------  Imaging from last 24 hours (if applicable):  Radiology interpretation: No results found.

## 2017-07-17 NOTE — Progress Notes (Signed)
[place]  [phone] [phone]     ID: Brooke [name] DOB: [birth year]  MR#: [id number]  SEG#:[id number]  Patient Care Team: Brooke Kern, DO as PCP - General (Family Medicine) Phineas Real, Brooke Block, MD (Obstetrics and Gynecology) Brooke Bookbinder, MD as Attending Physician (Dermatology) Brooke [name], Brooke Dad, MD as Consulting Physician (Oncology) Brooke Rudd, MD as Consulting Physician (Radiation Oncology) Brooke Kussmaul, MD as Consulting Physician (General Surgery) Brooke Limbo, MD as Consulting Physician (Plastic Surgery) OTHER MD:  CHIEF COMPLAINT: Estrogen receptor positive breast cancer  CURRENT TREATMENT: Neoadjuvant chemotherapy [TC then CMF]  HISTORY OF CURRENT ILLNESS: From the original intake note:  Brooke [name] herself palpated a change in her left breast and brought this to medical attention.  On 01/18/2017 she underwent bilateral diagnostic mammography with tomography and bilateral breast ultrasonography at [facility].  The breast density was category B.  At the most recent exam was from December 2016.  In the right breast upper outer quadrant there was a 1 cm irregular mass associated with punctate calcification.  By ultrasound this was not well-defined, and it was biopsied on 01/26/2017 with tomographic guidance this showed a complex sclerosing lesion with the usual ductal hyperplasia.  The right axilla was sonographically  On the left mammography showed a 4 cm dense mass with indistinct margins in the upper inner quadrant.  Ultrasound defined a 3 cm irregular mass in the upper inner quadrant of the left breast which was palpable.  There was an abnormal lymph node in the left axilla.  Biopsy of the left mass and abnormal lymph node ([id number], on 01/26/2017) showed both to be involved by invasive ductal carcinoma, with some lobular features but E-cadherin positive, estrogen and progesterone receptors both 100% positive, with strong staining  intensity, with MIB-1 between 15-20%.  There was no HER-2 amplification, the signals ratio being 1.31/1.58 and the number per cell 2.30/2.05  The patient's subsequent history is as detailed below.  [name] returns today for follow-up and treatment of her estrogen receptor positive breast cancer.  Today is day 8 cycle 6 of 7 planned cycles of CMF chemotherapy received every 21 days.  (Recall that she received 1 cycle of cyclophosphamide/Taxotere initially which she tolerated poorly. That would make a total of 8 cycles).   Within 12 hours of chemo treatment on day 1, she had episodes of vomiting for 2 hours. She usually tries to take compazine after treatment, but during this cycle and the previous cycle, after she got home from chemo, she was so tired that she took a nap for a few hours. She didn't eat anything and her stomach was empty. Once she got past the nausea and vomiting stage, she felt better and was able to eat. She also feels chills more often mostly in the afternoon, even after eating a normal lunch. She also has started burping, but there is no abdominal discomfort. She denies mouth sores or constipation. She had some soreness from the port.    REVIEW OF SYSTEMS: Brooke [name] reports that she has dermatitis on the left lower leg. She was prescribed alclometasone creme to aid this. For exercise, she likes to move and be active to produce sweat for at least 45 minutes. She also walks around with a baton which helps her with balance.  She denies unusual headaches, visual changes, nausea, vomiting, or dizziness. There has been no unusual cough, phlegm production, or pleurisy. This been no change in bowel or bladder habits. She denies  unexplained fatigue or unexplained weight loss, bleeding, rash, or fever. A detailed review of systems was otherwise stable.      PAST MEDICAL HISTORY: Past Medical History:  Diagnosis Date  . Anemia   . Anxiety   . Arthritis   . Bilateral dry eyes   .  Cancer (HCC)   . Family history of breast cancer 02/02/2017  . Family history of colon cancer 02/02/2017  . Family history of prostate cancer in father 02/02/2017  . Fibroid   . History of kidney stones   . Hyperlipidemia   . Kidney stones   . Melanoma of upper arm (HCC) 2009   RIGHT ARM ; left breast cancer, squamous cell carcinoma of the skin  . Obesity (BMI 30.0-34.9)   . Osteopenia 05/2015   T score -1.3 FRAX 6%/0.5% stable from prior DEXA  . PONV (postoperative nausea and vomiting)   . UTI (urinary tract infection)   . Varicose veins of both lower extremities   . Vitamin D deficiency 07/2009   LOW VITAMIN D 29  . Vitamin deficiency 07/2008   VITAMIN D LOW 23    PAST SURGICAL HISTORY: Past Surgical History:  Procedure Laterality Date  . ABDOMINAL SURGERY  1975   ovairan cyst   . APPENDECTOMY    . BREAST SURGERY     bio  . CHOLECYSTECTOMY    . DERMOID CYST  EXCISION    . DILATION AND CURETTAGE OF UTERUS  1998  . GALLBLADDER SURGERY    . HYSTEROSCOPY    . insertion  port a cath  03/02/2016  . PORTACATH PLACEMENT N/A 03/02/2017   Procedure: INSERTION PORT-A-CATH;  Surgeon: Toth, Paul III, MD;  Location: [facility] OR;  Service: General;  Laterality: N/A;  . SKIN BIOPSY    . SKIN CANCER EXCISION  2010,2011   2010-RIGHT ARM, 2011-LEFT LOWER LEG.-DR LAURA LOMAX DERM. DR. GOODRICH IS HER DERM SURGEON.    FAMILY HISTORY Family History  Problem Relation Age of Onset  . Diabetes Father   . Hypertension Father   . Heart disease Father   . Prostate cancer Father 75  . Breast cancer Sister 49       again at age 57, GT results unk  . Skin cancer Sister   . Colon cancer Paternal Grandfather 75  . Hypertension Mother   . Heart failure Mother   The patient's father was diagnosed with prostate cancer in his 70s and died at age 84 from complications of that disease.  The patient's mother died at age 92 with congestive heart failure.  The patient had no brothers, 3 sisters.  One sister had  breast cancer at age 49 and again at age 56.  She has been genetically tested and was found to be BRCA not mutated in addition a paternal grandfather had colon cancer in his 70s.  There is no history of ovarian cancer in the family to the patient's knowledge.  GYNECOLOGIC HISTORY:  Patient's last menstrual period was 03/18/1997. Menarche age 13. The patient is GX P0.  She stopped having periods in 1999 and took hormone replacement approximately 5 years.  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 She is single and lives alone with no pets.    ADVANCED DIRECTIVES:    HEALTH MAINTENANCE: Social History   Tobacco Use  . Smoking status: Never Smoker  . Smokeless tobacco: Never Used  Substance Use Topics  .   Alcohol use: No    Frequency: Never    Comment: rare  . Drug use: No     Colonoscopy: Never  PAP:  Bone density: 2017/osteopenia   Allergies  Allergen Reactions  . Neosporin [Neomycin-Bacitracin Zn-Polymyx] Swelling, Rash and Other (See Comments)    Blisters  . Benzalkonium Chloride Other (See Comments)    Redness  . Latex Rash    Current Outpatient Medications  Medication Sig Dispense Refill  . acetaminophen (TYLENOL) 500 MG tablet Take 500 mg by mouth every 6 (six) hours as needed for moderate pain or headache.    . alclomethasone (ACLOVATE) 0.05 % cream Apply to affected area once daily as needed for dermatitis  0  . cephALEXin (KEFLEX) 500 MG capsule Take 1 capsule (500 mg total) by mouth 3 (three) times daily. 21 capsule 0  . dexamethasone (DECADRON) 4 MG tablet Take 2 tablets (8 mg total) by mouth 2 (two) times daily with a meal. 8 tablet 0  . Hyprom-Naphaz-Polysorb-Zn Sulf (CLEAR EYES COMPLETE OP) Place 1-2 drops into both eyes daily as needed (for dry eyes).    Marland Kitchen ibuprofen (ADVIL,MOTRIN) 200 MG tablet Take 200 mg by mouth every 6 (six) hours as needed  for headache or moderate pain.    . promethazine (PHENERGAN) 25 MG suppository Place 1 suppository (25 mg total) rectally every 6 (six) hours as needed for nausea or vomiting. 12 each 0   No current facility-administered medications for this visit.     OBJECTIVE: Middle-aged white woman who appears stated age  30:   07/18/17 1211  BP: 116/71  Pulse: 77  Resp: 18  Temp: 98.4 F (36.9 C)  SpO2: 98%     Body mass index is 33.67 kg/m.   Wt Readings from Last 3 Encounters:  07/18/17 228 lb (103.4 kg)  06/27/17 226 lb 1.6 oz (102.6 kg)  05/30/17 222 lb 9.6 oz (101 kg)      ECOG FS:1 - Symptomatic but completely ambulatory  Sclerae unicteric, pupils round and equal Oropharynx clear and moist No cervical or supraclavicular adenopathy Lungs no rales or rhonchi Heart regular rate and rhythm Abd soft, nontender, positive bowel sounds MSK no focal spinal tenderness, no upper extremity lymphedema Neuro: nonfocal, well oriented, appropriate affect Breasts: Right breast is unremarkable.  In the left breast I can still palpate the central mass, which is easily movable, feels to me about 3 cm, and is not associated with any erythema or skin change.  Both axillae are benign  LAB RESULTS:  CMP     Component Value Date/Time   NA 140 07/11/2017 1040   NA 141 03/03/2017 1358   K 4.2 07/11/2017 1040   K 3.3 (L) 03/03/2017 1358   CL 107 07/11/2017 1040   CO2 26 07/11/2017 1040   CO2 28 03/03/2017 1358   GLUCOSE 90 07/11/2017 1040   GLUCOSE 107 03/03/2017 1358   BUN 16 07/11/2017 1040   BUN 12.7 03/03/2017 1358   CREATININE 0.79 07/11/2017 1040   CREATININE 0.9 03/03/2017 1358   CALCIUM 9.7 07/11/2017 1040   CALCIUM 9.4 03/03/2017 1358   PROT 6.9 07/11/2017 1040   PROT 6.5 03/03/2017 1358   ALBUMIN 4.1 07/11/2017 1040   ALBUMIN 3.7 03/03/2017 1358   AST 17 07/11/2017 1040   AST 23 03/03/2017 1358   ALT 19 07/11/2017 1040   ALT 25 03/03/2017 1358   ALKPHOS 88 07/11/2017 1040    ALKPHOS 71 03/03/2017 1358   BILITOT 0.6 07/11/2017 1040  BILITOT 0.52 03/03/2017 1358   GFRNONAA >60 07/11/2017 1040   GFRAA >60 07/11/2017 1040    No results found for: TOTALPROTELP, ALBUMINELP, A1GS, A2GS, BETS, BETA2SER, GAMS, MSPIKE, SPEI  No results found for: KPAFRELGTCHN, LAMBDASER, KAPLAMBRATIO  Lab Results  Component Value Date   WBC 3.4 (L) 07/18/2017   NEUTROABS 2.3 07/18/2017   HGB 12.3 07/18/2017   HCT 37.2 07/18/2017   MCV 89.6 07/18/2017   PLT 192 07/18/2017      Chemistry      Component Value Date/Time   NA 140 07/11/2017 1040   NA 141 03/03/2017 1358   K 4.2 07/11/2017 1040   K 3.3 (L) 03/03/2017 1358   CL 107 07/11/2017 1040   CO2 26 07/11/2017 1040   CO2 28 03/03/2017 1358   BUN 16 07/11/2017 1040   BUN 12.7 03/03/2017 1358   CREATININE 0.79 07/11/2017 1040   CREATININE 0.9 03/03/2017 1358      Component Value Date/Time   CALCIUM 9.7 07/11/2017 1040   CALCIUM 9.4 03/03/2017 1358   ALKPHOS 88 07/11/2017 1040   ALKPHOS 71 03/03/2017 1358   AST 17 07/11/2017 1040   AST 23 03/03/2017 1358   ALT 19 07/11/2017 1040   ALT 25 03/03/2017 1358   BILITOT 0.6 07/11/2017 1040   BILITOT 0.52 03/03/2017 1358       No results found for: LABCA2  No components found for: LABCAN125  No results for input(s): INR in the last 168 hours.  No results found for: LABCA2  No results found for: CAN199  No results found for: CAN125  No results found for: CAN153  No results found for: CA2729  No components found for: HGQUANT  No results found for: CEA1 / No results found for: CEA1   No results found for: AFPTUMOR  No results found for: CHROMOGRNA  No results found for: PSA1  Appointment on 07/18/2017  Component Date Value Ref Range Status  . WBC Count 07/18/2017 3.4* 3.9 - 10.3 K/uL Final  . RBC 07/18/2017 4.15  3.70 - 5.45 MIL/uL Final  . Hemoglobin 07/18/2017 12.3  11.6 - 15.9 g/dL Final  . HCT 07/18/2017 37.2  34.8 - 46.6 % Final  . MCV  07/18/2017 89.6  79.5 - 101.0 fL Final  . MCH 07/18/2017 29.6  25.1 - 34.0 pg Final  . MCHC 07/18/2017 33.1  31.5 - 36.0 g/dL Final  . RDW 07/18/2017 13.8  11.2 - 14.5 % Final  . Platelet Count 07/18/2017 192  145 - 400 K/uL Final  . Neutrophils Relative % 07/18/2017 66  % Final  . Neutro Abs 07/18/2017 2.3  1.5 - 6.5 K/uL Final  . Lymphocytes Relative 07/18/2017 23  % Final  . Lymphs Abs 07/18/2017 0.8* 0.9 - 3.3 K/uL Final  . Monocytes Relative 07/18/2017 6  % Final  . Monocytes Absolute 07/18/2017 0.2  0.1 - 0.9 K/uL Final  . Eosinophils Relative 07/18/2017 4  % Final  . Eosinophils Absolute 07/18/2017 0.1  0.0 - 0.5 K/uL Final  . Basophils Relative 07/18/2017 1  % Final  . Basophils Absolute 07/18/2017 0.0  0.0 - 0.1 K/uL Final   Performed at [facility], [address] [place], [place] [address]    (this displays the last labs from the last 3 days)  No results found for: TOTALPROTELP, ALBUMINELP, A1GS, A2GS, BETS, BETA2SER, GAMS, MSPIKE, SPEI (this displays SPEP labs)  No results found for: KPAFRELGTCHN, LAMBDASER, KAPLAMBRATIO (kappa/lambda light chains)  No results   found for: HGBA, HGBA2QUANT, HGBFQUANT, HGBSQUAN (Hemoglobinopathy evaluation)   No results found for: LDH  No results found for: IRON, TIBC, IRONPCTSAT (Iron and TIBC)  No results found for: FERRITIN  Urinalysis    Component Value Date/Time   COLORURINE YELLOW 05/29/2015 [place] 7 05/29/2015 1113   LABSPEC 1.023 05/29/2015 1113   PHURINE 5.5 05/29/2015 1113   GLUCOSEU NEGATIVE 05/29/2015 1113   GLUCOSEU NEGATIVE 11/05/2012 1559   HGBUR NEGATIVE 05/29/2015 1113   BILIRUBINUR negative 02/10/2017 1622   KETONESUR NEGATIVE 05/29/2015 1113   PROTEINUR negative 02/10/2017 1622   PROTEINUR NEGATIVE 05/29/2015 1113   UROBILINOGEN 0.2 02/10/2017 1622   UROBILINOGEN 0.2 11/05/2012 1559   NITRITE negative 02/10/2017 1622   NITRITE NEGATIVE 05/29/2015 1113    LEUKOCYTESUR Negative 02/10/2017 1622     STUDIES: No results found.  ELIGIBLE FOR AVAILABLE RESEARCH PROTOCOL: No  ASSESSMENT: 67 y.o. [place] woman status post bilateral biopsies 01/26/2017, showing  (1) in the right breast, a complex sclerosing lesion  (2) in the left breast, a cT2 pN1 invasive ductal carcinoma (with some lobular features but E-cadherin positive), grade 2, estrogen and progesterone receptor positive, HER-2 not amplified, with an MIB-1 of 15-20%  (a) breast MRI 02/04/2017 suggests a T3 N1 tumor  (3) started neoadjuvant cyclophosphamide/docetaxel 03/03/2017, discontinued after 1 cycle with very poor tolerance  (a) started cyclophosphamide/methotrexate/fluorouracil (CMF) 03/28/2017, to be repeated for 7 cycles 21 days apart  (4) definitive surgery to follow  (5) adjuvant radiation to follow surgery  ( 6) to start antiestrogens at the completion of local treatment  PLAN: Kista had a little more nausea with her 6 cycle of CMF and I am going to put her back on dexamethasone after treatment to take care of that.  I also wrote her for Phenergan suppositories in case she cannot keep the pills down.  Her last dose will be 08/01/2017.  I have set her up for a breast MRI on 08/10/2017.  She will then see me on 08/14/2017 to discuss those results.  We are also going to present her at the multidisciplinary breast cancer conference that same week  She was thinking of either not having reconstruction or having delayed reconstruction and those are okay of course but it might mean that she would miss the opportunity of having a nipple sparing mastectomy, which I think would give her the best cosmetic result.  Of course if there is any involvement of the nipple she would not be able to have that anyway.  She does need to look at the MRI with Dr. Marlou Starks to make those decisions.  She has already met with Dr. Iran Planas and is thinking of meeting with Dr. Harlow Mares as well for second  opinion  I have encouraged her to start a more regular exercise program  She knows to call for any other issues that may develop before the next visit.   Jaice Digioia, Brooke Dad, MD  07/18/17 12:53 PM Medical Oncology and Hematology [facility] [address] [place], [place] [address] Tel. [phone]    Fax. [phone]  This document serves as a record of services personally performed by Lurline Del, MD. It was created on his behalf by Sheron Nightingale, a trained medical scribe. The creation of this record is based on the scribe's personal observations and the provider's statements to them.   GC mend

## 2017-07-18 ENCOUNTER — Inpatient Hospital Stay: Payer: Medicare Other

## 2017-07-18 ENCOUNTER — Inpatient Hospital Stay (HOSPITAL_BASED_OUTPATIENT_CLINIC_OR_DEPARTMENT_OTHER): Payer: Medicare Other | Admitting: Oncology

## 2017-07-18 ENCOUNTER — Telehealth: Payer: Self-pay | Admitting: Oncology

## 2017-07-18 VITALS — BP 116/71 | HR 77 | Temp 98.4°F | Resp 18 | Ht 69.0 in | Wt 228.0 lb

## 2017-07-18 DIAGNOSIS — M8580 Other specified disorders of bone density and structure, unspecified site: Secondary | ICD-10-CM

## 2017-07-18 DIAGNOSIS — Z170 Estrogen receptor positive status [ER+]: Secondary | ICD-10-CM | POA: Diagnosis not present

## 2017-07-18 DIAGNOSIS — M1990 Unspecified osteoarthritis, unspecified site: Secondary | ICD-10-CM

## 2017-07-18 DIAGNOSIS — Z800 Family history of malignant neoplasm of digestive organs: Secondary | ICD-10-CM

## 2017-07-18 DIAGNOSIS — E785 Hyperlipidemia, unspecified: Secondary | ICD-10-CM

## 2017-07-18 DIAGNOSIS — Z79899 Other long term (current) drug therapy: Secondary | ICD-10-CM

## 2017-07-18 DIAGNOSIS — F309 Manic episode, unspecified: Secondary | ICD-10-CM

## 2017-07-18 DIAGNOSIS — Z85828 Personal history of other malignant neoplasm of skin: Secondary | ICD-10-CM

## 2017-07-18 DIAGNOSIS — Z8042 Family history of malignant neoplasm of prostate: Secondary | ICD-10-CM

## 2017-07-18 DIAGNOSIS — Z85820 Personal history of malignant melanoma of skin: Secondary | ICD-10-CM

## 2017-07-18 DIAGNOSIS — C50812 Malignant neoplasm of overlapping sites of left female breast: Secondary | ICD-10-CM | POA: Diagnosis not present

## 2017-07-18 DIAGNOSIS — Z803 Family history of malignant neoplasm of breast: Secondary | ICD-10-CM

## 2017-07-18 DIAGNOSIS — D0361 Melanoma in situ of right upper limb, including shoulder: Secondary | ICD-10-CM | POA: Insufficient documentation

## 2017-07-18 LAB — CBC WITH DIFFERENTIAL (CANCER CENTER ONLY)
BASOS PCT: 1 %
Basophils Absolute: 0 10*3/uL (ref 0.0–0.1)
EOS ABS: 0.1 10*3/uL (ref 0.0–0.5)
EOS PCT: 4 %
HCT: 37.2 % (ref 34.8–46.6)
Hemoglobin: 12.3 g/dL (ref 11.6–15.9)
LYMPHS ABS: 0.8 10*3/uL — AB (ref 0.9–3.3)
Lymphocytes Relative: 23 %
MCH: 29.6 pg (ref 25.1–34.0)
MCHC: 33.1 g/dL (ref 31.5–36.0)
MCV: 89.6 fL (ref 79.5–101.0)
Monocytes Absolute: 0.2 10*3/uL (ref 0.1–0.9)
Monocytes Relative: 6 %
NEUTROS PCT: 66 %
Neutro Abs: 2.3 10*3/uL (ref 1.5–6.5)
PLATELETS: 192 10*3/uL (ref 145–400)
RBC: 4.15 MIL/uL (ref 3.70–5.45)
RDW: 13.8 % (ref 11.2–14.5)
WBC: 3.4 10*3/uL — AB (ref 3.9–10.3)

## 2017-07-18 MED ORDER — PROMETHAZINE HCL 25 MG RE SUPP: 25.0000 mg | Freq: Four times a day (QID) | RECTAL | 0 refills | Status: DC | PRN

## 2017-07-18 MED ORDER — DEXAMETHASONE 4 MG PO TABS: 8.0000 mg | ORAL_TABLET | Freq: Two times a day (BID) | ORAL | 0 refills | Status: DC

## 2017-07-18 NOTE — Telephone Encounter (Signed)
Gave patient AVs and calendar of upcoming June appointments. °

## 2017-07-19 ENCOUNTER — Telehealth: Payer: Self-pay | Admitting: Oncology

## 2017-07-19 NOTE — Telephone Encounter (Signed)
LVM reference MRI appointment in June, gave my call back number as well as [place] number if changes need to be made to appointment

## 2017-08-01 ENCOUNTER — Inpatient Hospital Stay: Payer: Medicare Other

## 2017-08-01 ENCOUNTER — Inpatient Hospital Stay: Payer: Medicare Other | Attending: Oncology

## 2017-08-01 VITALS — BP 139/73 | HR 84 | Temp 98.5°F | Resp 18 | Ht 69.0 in | Wt 231.0 lb

## 2017-08-01 DIAGNOSIS — E785 Hyperlipidemia, unspecified: Secondary | ICD-10-CM | POA: Insufficient documentation

## 2017-08-01 DIAGNOSIS — Z79899 Other long term (current) drug therapy: Secondary | ICD-10-CM | POA: Diagnosis not present

## 2017-08-01 DIAGNOSIS — Z800 Family history of malignant neoplasm of digestive organs: Secondary | ICD-10-CM | POA: Insufficient documentation

## 2017-08-01 DIAGNOSIS — Z170 Estrogen receptor positive status [ER+]: Secondary | ICD-10-CM | POA: Insufficient documentation

## 2017-08-01 DIAGNOSIS — F419 Anxiety disorder, unspecified: Secondary | ICD-10-CM | POA: Insufficient documentation

## 2017-08-01 DIAGNOSIS — Z8042 Family history of malignant neoplasm of prostate: Secondary | ICD-10-CM | POA: Diagnosis not present

## 2017-08-01 DIAGNOSIS — C50812 Malignant neoplasm of overlapping sites of left female breast: Secondary | ICD-10-CM

## 2017-08-01 DIAGNOSIS — C50212 Malignant neoplasm of upper-inner quadrant of left female breast: Secondary | ICD-10-CM | POA: Insufficient documentation

## 2017-08-01 DIAGNOSIS — M8580 Other specified disorders of bone density and structure, unspecified site: Secondary | ICD-10-CM | POA: Diagnosis not present

## 2017-08-01 DIAGNOSIS — Z85820 Personal history of malignant melanoma of skin: Secondary | ICD-10-CM | POA: Insufficient documentation

## 2017-08-01 DIAGNOSIS — Z5111 Encounter for antineoplastic chemotherapy: Secondary | ICD-10-CM | POA: Diagnosis not present

## 2017-08-01 DIAGNOSIS — R5383 Other fatigue: Secondary | ICD-10-CM | POA: Insufficient documentation

## 2017-08-01 DIAGNOSIS — Z803 Family history of malignant neoplasm of breast: Secondary | ICD-10-CM | POA: Insufficient documentation

## 2017-08-01 DIAGNOSIS — D0361 Melanoma in situ of right upper limb, including shoulder: Secondary | ICD-10-CM

## 2017-08-01 DIAGNOSIS — Z95828 Presence of other vascular implants and grafts: Secondary | ICD-10-CM

## 2017-08-01 LAB — COMPREHENSIVE METABOLIC PANEL
ALK PHOS: 95 U/L (ref 40–150)
ALT: 9 U/L (ref 0–55)
ANION GAP: 9 (ref 3–11)
AST: 13 U/L (ref 5–34)
Albumin: 4.1 g/dL (ref 3.5–5.0)
BUN: 16 mg/dL (ref 7–26)
CALCIUM: 9.7 mg/dL (ref 8.4–10.4)
CO2: 26 mmol/L (ref 22–29)
Chloride: 105 mmol/L (ref 98–109)
Creatinine, Ser: 0.84 mg/dL (ref 0.60–1.10)
GFR calc Af Amer: >60 mL/min (ref >60)
GFR calc non Af Amer: >60 mL/min (ref >60)
Glucose, Bld: 92 mg/dL (ref 70–140)
POTASSIUM: 4 mmol/L (ref 3.5–5.1)
Sodium: 140 mmol/L (ref 136–145)
Total Bilirubin: 0.5 mg/dL (ref 0.2–1.2)
Total Protein: 6.9 g/dL (ref 6.4–8.3)

## 2017-08-01 LAB — CBC WITH DIFFERENTIAL (CANCER CENTER ONLY)
Basophils Absolute: 0 10*3/uL (ref 0.0–0.1)
Basophils Relative: 1 %
Eosinophils Absolute: 0.1 10*3/uL (ref 0.0–0.5)
Eosinophils Relative: 3 %
HEMATOCRIT: 38.6 % (ref 34.8–46.6)
Hemoglobin: 12.6 g/dL (ref 11.6–15.9)
LYMPHS ABS: 0.7 10*3/uL — AB (ref 0.9–3.3)
LYMPHS PCT: 25 %
MCH: 29.2 pg (ref 25.1–34.0)
MCHC: 32.6 g/dL (ref 31.5–36.0)
MCV: 89.6 fL (ref 79.5–101.0)
MONO ABS: 0.7 10*3/uL (ref 0.1–0.9)
Monocytes Relative: 24 %
NEUTROS ABS: 1.4 10*3/uL — AB (ref 1.5–6.5)
Neutrophils Relative %: 47 %
Platelet Count: 244 10*3/uL (ref 145–400)
RBC: 4.31 MIL/uL (ref 3.70–5.45)
RDW: 14 % (ref 11.2–14.5)
WBC Count: 2.9 10*3/uL — ABNORMAL LOW (ref 3.9–10.3)

## 2017-08-01 MED ORDER — PROCHLORPERAZINE EDISYLATE 10 MG/2ML IJ SOLN
INTRAMUSCULAR | Status: AC
  Filled 2017-08-01: qty 2

## 2017-08-01 MED ORDER — SODIUM CHLORIDE 0.9 % IV SOLN
Freq: Once | INTRAVENOUS | Status: AC
  Administered 2017-08-01: 11:00:00 via INTRAVENOUS

## 2017-08-01 MED ORDER — DEXAMETHASONE SODIUM PHOSPHATE 10 MG/ML IJ SOLN
INTRAMUSCULAR | Status: AC
  Filled 2017-08-01: qty 1

## 2017-08-01 MED ORDER — PROCHLORPERAZINE EDISYLATE 10 MG/2ML IJ SOLN
10.0000 mg | Freq: Once | INTRAMUSCULAR | Status: AC
Start: 2017-08-01 — End: 2017-08-01
  Administered 2017-08-01: 10 mg via INTRAVENOUS
  Filled 2017-08-01: qty 2

## 2017-08-01 MED ORDER — HEPARIN SOD (PORK) LOCK FLUSH 100 UNIT/ML IV SOLN
500.0000 [IU] | Freq: Once | INTRAVENOUS | Status: AC | PRN
  Administered 2017-08-01: 500 [IU]
  Filled 2017-08-01: qty 5

## 2017-08-01 MED ORDER — METHOTREXATE SODIUM (PF) CHEMO INJECTION 250 MG/10ML
40.0000 mg/m2 | Freq: Once | INTRAMUSCULAR | Status: AC
  Administered 2017-08-01: 88 mg via INTRAVENOUS
  Filled 2017-08-01: qty 3.52

## 2017-08-01 MED ORDER — DEXAMETHASONE SODIUM PHOSPHATE 10 MG/ML IJ SOLN
10.0000 mg | Freq: Once | INTRAMUSCULAR | Status: AC
  Administered 2017-08-01: 10 mg via INTRAVENOUS

## 2017-08-01 MED ORDER — FLUOROURACIL CHEMO INJECTION 2.5 GM/50ML
600.0000 mg/m2 | Freq: Once | INTRAVENOUS | Status: AC
  Administered 2017-08-01: 1300 mg via INTRAVENOUS
  Filled 2017-08-01: qty 26

## 2017-08-01 MED ORDER — SODIUM CHLORIDE 0.9 % IV SOLN
600.0000 mg/m2 | Freq: Once | INTRAVENOUS | Status: AC
  Administered 2017-08-01: 1320 mg via INTRAVENOUS
  Filled 2017-08-01: qty 66

## 2017-08-01 MED ORDER — SODIUM CHLORIDE 0.9% FLUSH
10.0000 mL | INTRAVENOUS | Status: DC | PRN
  Administered 2017-08-01: 10 mL
  Filled 2017-08-01: qty 10

## 2017-08-01 NOTE — Patient Instructions (Addendum)
[facility] Discharge Instructions for Patients Receiving Chemotherapy  Today you received the following chemotherapy agents Cytoxan, Methotrexate, and 5FU.   To help prevent nausea and vomiting after your treatment, we encourage you to take your nausea medication as directed.  If you develop nausea and vomiting that is not controlled by your nausea medication, call the clinic.   BELOW ARE SYMPTOMS THAT SHOULD BE REPORTED IMMEDIATELY:  *FEVER GREATER THAN 100.5 F  *CHILLS WITH OR WITHOUT FEVER  NAUSEA AND VOMITING THAT IS NOT CONTROLLED WITH YOUR NAUSEA MEDICATION  *UNUSUAL SHORTNESS OF BREATH  *UNUSUAL BRUISING OR BLEEDING  TENDERNESS IN MOUTH AND THROAT WITH OR WITHOUT PRESENCE OF ULCERS  *URINARY PROBLEMS  *BOWEL PROBLEMS  UNUSUAL RASH Items with * indicate a potential emergency and should be followed up as soon as possible.  Feel free to call the clinic should you have any questions or concerns. The clinic phone number is [phone].  Please show the [place] at check-in to the Emergency Department and triage nurse.   Neutropenia Neutropenia is a condition that occurs when you have a lower-than-normal level of a type of white blood cell (neutrophil) in your body. Neutrophils are made in the spongy center of large bones (bone marrow) and they fight infections. Neutrophils are your body's main defense against bacterial and fungal infections. The fewer neutrophils you have and the longer your body remains without them, the greater your risk of getting a severe infection. What are the causes? This condition can occur if your body uses up or destroys neutrophils faster than your bone marrow can make them. This problem may happen because of:  Bacterial or fungal infection.  Allergic disorders.  Reactions to some medicines.  Autoimmune disease.  An enlarged spleen.  This condition can also occur if your bone marrow does not produce enough  neutrophils. This problem may be caused by:  Cancer.  Cancer treatments, such as radiation or chemotherapy.  Viral infections.  Medicines, such as phenytoin.  Vitamin B12 deficiency.  Diseases of the bone marrow.  Environmental toxins, such as insecticides.  What are the signs or symptoms? This condition does not usually cause symptoms. If symptoms are present, they are usually caused by an underlying infection. Symptoms of an infection may include:  Fever.  Chills.  Swollen glands.  Oral or anal ulcers.  Cough and shortness of breath.  Rash.  Skin infection.  Fatigue.  How is this diagnosed? Your health care provider may suspect neutropenia if you have:  A condition that may cause neutropenia.  Symptoms of infection, especially fever.  Frequent and unusual infections.  You will have a medical history and physical exam. Tests will also be done, such as:  A complete blood count (CBC).  A procedure to collect a sample of bone marrow for examination (bone marrow biopsy).  A chest X-ray.  A urine culture.  A blood culture.  How is this treated? Treatment depends on the underlying cause and severity of your condition. Mild neutropenia may not require treatment. Treatment may include medicines, such as:  Antibiotic medicine given through an IV tube.  Antiviral medicines.  Antifungal medicines.  A medicine to increase neutrophil production (colony-stimulating factor). You may get this drug through an IV tube or by injection.  Steroids given through an IV tube.  If an underlying condition is causing neutropenia, you may need treatment for that condition. If medicines you are taking are causing neutropenia, your health care provider may have you  stop taking those medicines. Follow these instructions at home: Medicines  Take over-the-counter and prescription medicines only as told by your health care provider.  Get a seasonal flu shot (influenza  vaccine). Lifestyle  Do not eat unpasteurized foods.Do not eat unwashed raw fruits or vegetables.  Avoid exposure to groups of people or children.  Avoid being around people who are sick.  Avoid being around dirt or dust, such as in construction areas or gardens.  Do not provide direct care for pets. Avoid animal droppings. Do not clean litter boxes and bird cages. Hygiene   Bathe daily.  Clean the area between the genitals and the anus (perineal area) after you urinate or have a bowel movement. If you are female, wipe from front to back.  Brush your teeth with a soft toothbrush before and after meals.  Do not use a razor that has a blade. Use an electric razor to remove hair.  Wash your hands often. Make sure others who come in contact with you also wash their hands. If soap and water are not available, use hand sanitizer. General instructions  Do not have sex unless your health care provider has approved.  Take actions to avoid cuts and burns. For example: ? Be cautious when you use knives. Always cut away from yourself. ? Keep knives in protective sheaths or guards when not in use. ? Use oven mitts when you cook with a hot stove, oven, or grill. ? Stand a safe distance away from open fires.  Avoid people who received a vaccine in the past 30 days if that vaccine contained a live version of the germ (live vaccine). You should not get a live vaccine. Common live vaccines are varicella, measles, mumps, and rubella.  Do not share food utensils.  Do not use tampons, enemas, or rectal suppositories unless your health care provider has approved.  Keep all appointments as told by your health care provider. This is important. Contact a health care provider if:  You have a fever.  You have chills or you start to shake.  You have: ? A sore throat. ? A warm, red, or tender area on your skin. ? A cough. ? Frequent or painful urination. ? Vaginal discharge or itching.  You  develop: ? Sores in your mouth or anus. ? Swollen lymph nodes. ? Red streaks on the skin. ? A rash.  You feel: ? Nauseous or you vomit. ? Very fatigued. ? Short of breath. This information is not intended to replace advice given to you by your health care provider. Make sure you discuss any questions you have with your health care provider. Document Released: 08/06/2001 Document Revised: 07/23/2015 Document Reviewed: 08/27/2014 Elsevier Interactive Patient Education  [employer].

## 2017-08-01 NOTE — Progress Notes (Signed)
Per Dr [name] ok to tx today with [place] 1.4.

## 2017-08-03 ENCOUNTER — Telehealth: Payer: Self-pay | Admitting: *Deleted

## 2017-08-10 ENCOUNTER — Ambulatory Visit
Admission: RE | Admit: 2017-08-10 | Discharge: 2017-08-10 | Disposition: A | Discharge status: Home / Self Care | Payer: Medicare Other | Source: Ambulatory Visit | Attending: Oncology | Admitting: Oncology

## 2017-08-10 DIAGNOSIS — Z170 Estrogen receptor positive status [ER+]: Principal | ICD-10-CM

## 2017-08-10 DIAGNOSIS — C50812 Malignant neoplasm of overlapping sites of left female breast: Secondary | ICD-10-CM

## 2017-08-10 DIAGNOSIS — D0361 Melanoma in situ of right upper limb, including shoulder: Secondary | ICD-10-CM

## 2017-08-10 MED ORDER — GADOBENATE DIMEGLUMINE 529 MG/ML IV SOLN
20.0000 mL | Freq: Once | INTRAVENOUS | Status: AC | PRN
  Administered 2017-08-10: 20 mL via INTRAVENOUS

## 2017-08-14 ENCOUNTER — Other Ambulatory Visit: Payer: Self-pay | Admitting: Oncology

## 2017-08-14 ENCOUNTER — Encounter: Payer: Self-pay | Admitting: Adult Health

## 2017-08-14 ENCOUNTER — Inpatient Hospital Stay: Payer: Medicare Other | Admitting: Adult Health

## 2017-08-14 ENCOUNTER — Inpatient Hospital Stay: Payer: Medicare Other

## 2017-08-14 VITALS — BP 140/65 | HR 78 | Temp 98.7°F | Resp 19 | Ht 69.0 in | Wt 236.0 lb

## 2017-08-14 DIAGNOSIS — E785 Hyperlipidemia, unspecified: Secondary | ICD-10-CM

## 2017-08-14 DIAGNOSIS — C50212 Malignant neoplasm of upper-inner quadrant of left female breast: Secondary | ICD-10-CM

## 2017-08-14 DIAGNOSIS — Z170 Estrogen receptor positive status [ER+]: Secondary | ICD-10-CM | POA: Diagnosis not present

## 2017-08-14 DIAGNOSIS — Z85820 Personal history of malignant melanoma of skin: Secondary | ICD-10-CM

## 2017-08-14 DIAGNOSIS — D0361 Melanoma in situ of right upper limb, including shoulder: Secondary | ICD-10-CM

## 2017-08-14 DIAGNOSIS — R5383 Other fatigue: Secondary | ICD-10-CM | POA: Diagnosis not present

## 2017-08-14 DIAGNOSIS — Z79899 Other long term (current) drug therapy: Secondary | ICD-10-CM

## 2017-08-14 DIAGNOSIS — C50812 Malignant neoplasm of overlapping sites of left female breast: Secondary | ICD-10-CM

## 2017-08-14 DIAGNOSIS — Z803 Family history of malignant neoplasm of breast: Secondary | ICD-10-CM

## 2017-08-14 DIAGNOSIS — M8580 Other specified disorders of bone density and structure, unspecified site: Secondary | ICD-10-CM

## 2017-08-14 DIAGNOSIS — F419 Anxiety disorder, unspecified: Secondary | ICD-10-CM

## 2017-08-14 DIAGNOSIS — Z8042 Family history of malignant neoplasm of prostate: Secondary | ICD-10-CM

## 2017-08-14 DIAGNOSIS — Z800 Family history of malignant neoplasm of digestive organs: Secondary | ICD-10-CM

## 2017-08-14 LAB — COMPREHENSIVE METABOLIC PANEL
ALBUMIN: 3.9 g/dL (ref 3.5–5.0)
ALT: 16 U/L (ref 0–55)
AST: 17 U/L (ref 5–34)
Alkaline Phosphatase: 96 U/L (ref 40–150)
Anion gap: 7 (ref 3–11)
BUN: 12 mg/dL (ref 7–26)
CHLORIDE: 104 mmol/L (ref 98–109)
CO2: 29 mmol/L (ref 22–29)
CREATININE: 0.84 mg/dL (ref 0.60–1.10)
Calcium: 9.4 mg/dL (ref 8.4–10.4)
GFR calc Af Amer: >60 mL/min (ref >60)
GLUCOSE: 96 mg/dL (ref 70–140)
POTASSIUM: 4.2 mmol/L (ref 3.5–5.1)
Sodium: 140 mmol/L (ref 136–145)
Total Bilirubin: 0.5 mg/dL (ref 0.2–1.2)
Total Protein: 6.5 g/dL (ref 6.4–8.3)

## 2017-08-14 LAB — CBC WITH DIFFERENTIAL (CANCER CENTER ONLY)
Basophils Absolute: 0.1 10*3/uL (ref 0.0–0.1)
Basophils Relative: 2 %
EOS ABS: 0.1 10*3/uL (ref 0.0–0.5)
EOS PCT: 4 %
HCT: 37.6 % (ref 34.8–46.6)
Hemoglobin: 12.5 g/dL (ref 11.6–15.9)
LYMPHS ABS: 0.6 10*3/uL — AB (ref 0.9–3.3)
LYMPHS PCT: 20 %
MCH: 29.5 pg (ref 25.1–34.0)
MCHC: 33.3 g/dL (ref 31.5–36.0)
MCV: 88.6 fL (ref 79.5–101.0)
MONO ABS: 0.3 10*3/uL (ref 0.1–0.9)
MONOS PCT: 11 %
Neutro Abs: 1.9 10*3/uL (ref 1.5–6.5)
Neutrophils Relative %: 63 %
PLATELETS: 208 10*3/uL (ref 145–400)
RBC: 4.24 MIL/uL (ref 3.70–5.45)
RDW: 14.6 % — AB (ref 11.2–14.5)
WBC Count: 3 10*3/uL — ABNORMAL LOW (ref 3.9–10.3)

## 2017-08-14 NOTE — Progress Notes (Addendum)
Brooke [name]  Telephone:([phone] Fax:([phone]) [phone]     ID: Brooke [name] DOB: [birth year]  MR#: [id number]  TJQ#:[id number]  Patient Care Team: [name], DO as PCP - General (Family Medicine) Phineas Real, Belinda Block, MD (Obstetrics and Gynecology) Rolm Bookbinder, MD as Attending Physician (Dermatology) Magrinat, Virgie Dad, MD as Consulting Physician (Oncology) Kyung Rudd, MD as Consulting Physician (Radiation Oncology) Jovita Kussmaul, MD as Consulting Physician (General Surgery) Irene Limbo, MD as Consulting Physician (Plastic Surgery) Delice Bison, Charlestine Massed, NP as Nurse Practitioner (Hematology and Oncology) OTHER MD:  CHIEF COMPLAINT: Estrogen receptor positive breast cancer  CURRENT TREATMENT: Neoadjuvant chemotherapy [TC then CMF]  HISTORY OF CURRENT ILLNESS: From the original intake note:  Brooke [name] herself palpated a change in her left breast and brought this to medical attention.  On 01/18/2017 she underwent bilateral diagnostic mammography with tomography and bilateral breast ultrasonography at [facility].  The breast density was category B.  At the most recent exam was from December 2016.  In the right breast upper outer quadrant there was a 1 cm irregular mass associated with punctate calcification.  By ultrasound this was not well-defined, and it was biopsied on 01/26/2017 with tomographic guidance this showed a complex sclerosing lesion with the usual ductal hyperplasia.  The right axilla was sonographically  On the left mammography showed a 4 cm dense mass with indistinct margins in the upper inner quadrant.  Ultrasound defined a 3 cm irregular mass in the upper inner quadrant of the left breast which was palpable.  There was an abnormal lymph node in the left axilla.  Biopsy of the left mass and abnormal lymph node ([id number], on 01/26/2017) showed both to be involved by invasive ductal carcinoma, with some lobular features but E-cadherin positive,  estrogen and progesterone receptors both 100% positive, with strong staining intensity, with MIB-1 between 15-20%.  There was no HER-2 amplification, the signals ratio being 1.31/1.58 and the number per cell 2.30/2.05  The patient's subsequent history is as detailed below.  Brooke [name] returns today for follow-up and treatment of her estrogen receptor positive breast cancer.  Brooke [name] has completed her neoadjuvant chemotherapy with CMF and underwent her breast MRI on 08/10/2017.  She is here today to review those results.     REVIEW OF SYSTEMS: Brooke [name] is feeling well today.  She does note being anxious about her results, however she continues to recover after her chemotherapy.  She has some fatigue, but denies other issues today like fevers, chills, headaches, mucositis, chest pain, palpitations, nausea, vomiting, abdominal pain, constipation, diarrhea, or any other concerns.  A detailed ROS was non contributory today.      PAST MEDICAL HISTORY: Past Medical History:  Diagnosis Date  . Anemia   . Anxiety   . Arthritis   . Bilateral dry eyes   . Cancer ([place])   . Family history of breast cancer 02/02/2017  . Family history of colon cancer 02/02/2017  . Family history of prostate cancer in father 02/02/2017  . Fibroid   . History of kidney stones   . Hyperlipidemia   . Kidney stones   . Melanoma of upper arm ([place]) 2009   RIGHT ARM ; left breast cancer, squamous cell carcinoma of the skin  . Obesity (BMI 30.0-34.9)   . Osteopenia 05/2015   T score -1.3 FRAX 6%/0.5% stable from prior DEXA  . PONV (postoperative nausea and vomiting)   . UTI (urinary tract infection)   . Varicose veins of  both lower extremities   . Vitamin D deficiency 07/2009   LOW VITAMIN D 29  . Vitamin deficiency 07/2008   VITAMIN D LOW 23    PAST SURGICAL HISTORY: Past Surgical History:  Procedure Laterality Date  . ABDOMINAL SURGERY  1975   ovairan cyst   . APPENDECTOMY    . BREAST SURGERY     bio  .  CHOLECYSTECTOMY    . DERMOID CYST  EXCISION    . DILATION AND CURETTAGE OF UTERUS  1998  . GALLBLADDER SURGERY    . HYSTEROSCOPY    . insertion  port a cath  03/02/2016  . PORTACATH PLACEMENT N/A 03/02/2017   Procedure: INSERTION PORT-A-CATH;  Surgeon: Jovita Kussmaul, MD;  Location: [place];  Service: General;  Laterality: N/A;  . SKIN BIOPSY    . SKIN CANCER EXCISION  2010,2011   2010-RIGHT ARM, 2011-LEFT LOWER LEG.-DR Rolm Bookbinder DERM. DR. Sarajane Jews IS HER DERM SURGEON.    FAMILY HISTORY Family History  Problem Relation Age of Onset  . Diabetes Father   . Hypertension Father   . Heart disease Father   . Prostate cancer Father 76  . Breast cancer Sister 18       again at age 85, GT results unk  . Skin cancer Sister   . Colon cancer Paternal Grandfather 56  . Hypertension Mother   . Heart failure Mother   The patient's father was diagnosed with prostate cancer in his 5s and died at age 53 from complications of that disease.  The patient's mother died at age 15 with congestive heart failure.  The patient had no brothers, 3 sisters.  One sister had breast cancer at age 26 and again at age 73.  She has been genetically tested and was found to be BRCA not mutated in addition a paternal grandfather had colon cancer in his 42s.  There is no history of ovarian cancer in the family to the patient's knowledge.  GYNECOLOGIC HISTORY:  Patient's last menstrual period was 03/18/1997. Menarche age 60. The patient is GX P0.  She stopped having periods in 1999 and took hormone replacement approximately 5 years.  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 She is single and lives alone with no pets.    ADVANCED DIRECTIVES:    HEALTH MAINTENANCE: Social History   Tobacco Use  . Smoking status: Never Smoker  . Smokeless tobacco: Never Used  Substance Use Topics  . Alcohol use: No      Frequency: Never    Comment: rare  . Drug use: No     Colonoscopy: Never  PAP:  Bone density: 2017/osteopenia   Allergies  Allergen Reactions  . Neosporin [Neomycin-Bacitracin Zn-Polymyx] Swelling, Rash and Other (See Comments)    Blisters  . Benzalkonium Chloride Other (See Comments)    Redness  . Latex Rash    Current Outpatient Medications  Medication Sig Dispense Refill  . acetaminophen (TYLENOL) 500 MG tablet Take 500 mg by mouth every 6 (six) hours as needed for moderate pain or headache.    . alclomethasone (ACLOVATE) 0.05 % cream Apply to affected area once daily as needed for dermatitis  0  . Hyprom-Naphaz-Polysorb-Zn Sulf (CLEAR EYES COMPLETE OP) Place 1-2 drops into both eyes daily as needed (for dry eyes).    Marland Kitchen ibuprofen (ADVIL,MOTRIN) 200 MG tablet Take 200 mg by mouth every 6 (six) hours as needed  for headache or moderate pain.    Marland Kitchen dexamethasone (DECADRON) 4 MG tablet Take 2 tablets (8 mg total) by mouth 2 (two) times daily with a meal. (Patient not taking: Reported on 08/14/2017) 8 tablet 0   No current facility-administered medications for this visit.     OBJECTIVE:  Vitals:   08/14/17 1258  BP: 140/65  Pulse: 78  Resp: 19  Temp: 98.7 F (37.1 C)  SpO2: 98%     Body mass index is 34.85 kg/m.   Wt Readings from Last 3 Encounters:  08/14/17 236 lb (107 kg)  08/01/17 231 lb (104.8 kg)  07/18/17 228 lb (103.4 kg)  ECOG FS:1 - Symptomatic but completely ambulatory GENERAL: Patient is a well appearing female in no acute distress HEENT:  Sclerae anicteric.  Oropharynx clear and moist. No ulcerations or evidence of oropharyngeal candidiasis. Neck is supple.  NODES:  No cervical, supraclavicular, or axillary lymphadenopathy palpated.  BREAST EXAM:  Deferred. LUNGS:  Clear to auscultation bilaterally.  No wheezes or rhonchi. HEART:  Regular rate and rhythm. No murmur appreciated. ABDOMEN:  Soft, nontender.  Positive, normoactive bowel sounds. No  organomegaly palpated. MSK:  No focal spinal tenderness to palpation. Full range of motion bilaterally in the upper extremities. EXTREMITIES:  No peripheral edema.   SKIN:  Clear with no obvious rashes or skin changes. No nail dyscrasia. NEURO:  Nonfocal. Well oriented.  Appropriate affect.    LAB RESULTS:  CMP     Component Value Date/Time   NA 140 08/14/2017 1225   NA 141 03/03/2017 1358   K 4.2 08/14/2017 1225   K 3.3 (L) 03/03/2017 1358   CL 104 08/14/2017 1225   CO2 29 08/14/2017 1225   CO2 28 03/03/2017 1358   GLUCOSE 96 08/14/2017 1225   GLUCOSE 107 03/03/2017 1358   BUN 12 08/14/2017 1225   BUN 12.7 03/03/2017 1358   CREATININE 0.84 08/14/2017 1225   CREATININE 0.79 07/11/2017 1040   CREATININE 0.9 03/03/2017 1358   CALCIUM 9.4 08/14/2017 1225   CALCIUM 9.4 03/03/2017 1358   PROT 6.5 08/14/2017 1225   PROT 6.5 03/03/2017 1358   ALBUMIN 3.9 08/14/2017 1225   ALBUMIN 3.7 03/03/2017 1358   AST 17 08/14/2017 1225   AST 17 07/11/2017 1040   AST 23 03/03/2017 1358   ALT 16 08/14/2017 1225   ALT 19 07/11/2017 1040   ALT 25 03/03/2017 1358   ALKPHOS 96 08/14/2017 1225   ALKPHOS 71 03/03/2017 1358   BILITOT 0.5 08/14/2017 1225   BILITOT 0.6 07/11/2017 1040   BILITOT 0.52 03/03/2017 1358   GFRNONAA >60 08/14/2017 1225   GFRNONAA >60 07/11/2017 1040   GFRAA >60 08/14/2017 1225   GFRAA >60 07/11/2017 1040    No results found for: TOTALPROTELP, ALBUMINELP, A1GS, A2GS, BETS, BETA2SER, GAMS, MSPIKE, SPEI  No results found for: KPAFRELGTCHN, LAMBDASER, [facility] [place]  Lab Results  Component Value Date   WBC 3.0 (L) 08/14/2017   NEUTROABS 1.9 08/14/2017   HGB 12.5 08/14/2017   HCT 37.6 08/14/2017   MCV 88.6 08/14/2017   PLT 208 08/14/2017      Chemistry      Component Value Date/Time   NA 140 08/14/2017 1225   NA 141 03/03/2017 1358   K 4.2 08/14/2017 1225   K 3.3 (L) 03/03/2017 1358   CL 104 08/14/2017 1225   CO2 29 08/14/2017 1225   CO2 28 03/03/2017  1358   BUN 12 08/14/2017 1225   BUN 12.7 03/03/2017 1358  CREATININE 0.84 08/14/2017 1225   CREATININE 0.79 07/11/2017 1040   CREATININE 0.9 03/03/2017 1358      Component Value Date/Time   CALCIUM 9.4 08/14/2017 1225   CALCIUM 9.4 03/03/2017 1358   ALKPHOS 96 08/14/2017 1225   ALKPHOS 71 03/03/2017 1358   AST 17 08/14/2017 1225   AST 17 07/11/2017 1040   AST 23 03/03/2017 1358   ALT 16 08/14/2017 1225   ALT 19 07/11/2017 1040   ALT 25 03/03/2017 1358   BILITOT 0.5 08/14/2017 1225   BILITOT 0.6 07/11/2017 1040   BILITOT 0.52 03/03/2017 1358       No results found for: LABCA2  No components found for: IONGEX528  No results for input(s): INR in the last 168 hours.  No results found for: LABCA2  No results found for: UXL244  No results found for: WNU272  No results found for: ZDG644  No results found for: CA2729  No components found for: HGQUANT  No results found for: CEA1 / No results found for: CEA1   No results found for: AFPTUMOR  No results found for: CHROMOGRNA  No results found for: PSA1  Appointment on 08/14/2017  Component Date Value Ref Range Status  . Sodium 08/14/2017 140  136 - 145 mmol/L Final  . Potassium 08/14/2017 4.2  3.5 - 5.1 mmol/L Final  . Chloride 08/14/2017 104  98 - 109 mmol/L Final  . CO2 08/14/2017 29  22 - 29 mmol/L Final  . Glucose, Bld 08/14/2017 96  70 - 140 mg/dL Final  . BUN 08/14/2017 12  7 - 26 mg/dL Final  . Creatinine, Ser 08/14/2017 0.84  0.60 - 1.10 mg/dL Final  . Calcium 08/14/2017 9.4  8.4 - 10.4 mg/dL Final  . Total Protein 08/14/2017 6.5  6.4 - 8.3 g/dL Final  . Albumin 08/14/2017 3.9  3.5 - 5.0 g/dL Final  . AST 08/14/2017 17  5 - 34 U/L Final  . ALT 08/14/2017 16  0 - 55 U/L Final  . Alkaline Phosphatase 08/14/2017 96  40 - 150 U/L Final  . Total Bilirubin 08/14/2017 0.5  0.2 - 1.2 mg/dL Final  . GFR calc non Af Amer 08/14/2017 >60  >60 mL/min Final  . GFR calc Af Amer 08/14/2017 >60  >60 mL/min Final    Comment: (NOTE) The eGFR has been calculated using the CKD EPI equation. This calculation has not been validated in all clinical situations. eGFR's persistently <60 mL/min signify possible Chronic Kidney Disease.   Georgiann Hahn gap 08/14/2017 7  3 - 11 Final   Performed at [facility], [place] [address], [place], [place] [address]  . WBC Count 08/14/2017 3.0* 3.9 - 10.3 K/uL Final  . RBC 08/14/2017 4.24  3.70 - 5.45 MIL/uL Final  . Hemoglobin 08/14/2017 12.5  11.6 - 15.9 g/dL Final  . HCT 08/14/2017 37.6  34.8 - 46.6 % Final  . MCV 08/14/2017 88.6  79.5 - 101.0 fL Final  . MCH 08/14/2017 29.5  25.1 - 34.0 pg Final  . MCHC 08/14/2017 33.3  31.5 - 36.0 g/dL Final  . RDW 08/14/2017 14.6* 11.2 - 14.5 % Final  . Platelet Count 08/14/2017 208  145 - 400 K/uL Final  . Neutrophils Relative % 08/14/2017 63  % Final  . Neutro Abs 08/14/2017 1.9  1.5 - 6.5 K/uL Final  . Lymphocytes Relative 08/14/2017 20  % Final  . Lymphs Abs 08/14/2017 0.6* 0.9 - 3.3 K/uL Final  . Monocytes Relative 08/14/2017  11  % Final  . Monocytes Absolute 08/14/2017 0.3  0.1 - 0.9 K/uL Final  . Eosinophils Relative 08/14/2017 4  % Final  . Eosinophils Absolute 08/14/2017 0.1  0.0 - 0.5 K/uL Final  . Basophils Relative 08/14/2017 2  % Final  . Basophils Absolute 08/14/2017 0.1  0.0 - 0.1 K/uL Final   Performed at [facility], [address] [place],  [address]    (this displays the last labs from the last 3 days)  No results found for: TOTALPROTELP, ALBUMINELP, A1GS, A2GS, BETS, BETA2SER, GAMS, MSPIKE, SPEI (this displays SPEP labs)  No results found for: KPAFRELGTCHN, LAMBDASER, KAPLAMBRATIO (kappa/lambda light chains)  No results found for: HGBA, HGBA2QUANT, HGBFQUANT, HGBSQUAN (Hemoglobinopathy evaluation)   No results found for: LDH  No results found for: IRON, TIBC, IRONPCTSAT (Iron and TIBC)  No results found for: FERRITIN  Urinalysis    Component  Value Date/Time   COLORURINE YELLOW 05/29/2015 [place] 05/29/2015 1113   LABSPEC 1.023 05/29/2015 1113   PHURINE 5.5 05/29/2015 1113   [place] 05/29/2015 1113   GLUCOSEU NEGATIVE 11/05/2012 1559   HGBUR NEGATIVE 05/29/2015 1113   BILIRUBINUR negative 02/10/2017 1622   KETONESUR NEGATIVE 05/29/2015 1113   PROTEINUR negative 02/10/2017 1622   PROTEINUR NEGATIVE 05/29/2015 1113   UROBILINOGEN 0.2 02/10/2017 1622   UROBILINOGEN 0.2 11/05/2012 1559   NITRITE negative 02/10/2017 1622   NITRITE NEGATIVE 05/29/2015 1113   LEUKOCYTESUR Negative 02/10/2017 1622     STUDIES: Mr Breast Bilateral W Wo Contrast Inc Cad  Result Date: 08/10/2017 CLINICAL DATA:  Assess for response following neoadjuvant chemotherapy. Dx of malignant neoplasm of overlapping sites of left breast November 2018. Estrogen receptor positive. Bx on both breasts November 2018. Mammogram at [facility] Nov 2018. Also hx of melanoma right upper extremity. Has had 8 rounds of chemo starting in Jan 2019. Family hx of breast cancer (sister age 35s or 65s.) Felt lumps in both breasts Nov 2018 and having some pain and soreness in left breast. Patient also had a lesion in the right breast biopsied revealing a complex sclerosing lesion. LABS:  No labs drawn at time of imaging. EXAM: BILATERAL BREAST MRI WITH AND WITHOUT CONTRAST TECHNIQUE: Multiplanar, multisequence MR images of both breasts were obtained prior to and following the intravenous administration of 20 ml of MultiHance. THREE-DIMENSIONAL MR IMAGE RENDERING ON INDEPENDENT WORKSTATION: Three-dimensional MR images were rendered by post-processing of the original MR data on an independent workstation. The three-dimensional MR images were interpreted, and findings are reported in the following complete MRI report for this study. Three dimensional images were evaluated at the independent DynaCad workstation COMPARISON:  Breast MRI, 02/04/2017. Prior mammograms and  ultrasounds. FINDINGS: Breast composition: b. Scattered fibroglandular tissue. Background parenchymal enhancement: Mild Right breast: Focal enhancement lies adjacent to the biopsy clip artifact in the central, lateral right breast. This now spans a maximum of 10 mm, decreased in size from the prior breast MRI. There are no other areas of abnormal right breast enhancement. Left breast: Abnormal enhancement persists in the left breast. This appears to be a combination of 2 adjacent masses and intervening non mass enhancement. The dominant mass, irregular in shape with irregular margins, lies in the central upper inner quadrant measuring 3.6 x 2.5 x 2.8 cm. Anterior to this, in the retroareolar breast, a second irregular mass measures 16 x 13 x 16 mm. There is non mass enhancement that extends between the 2 masses, protrudes 11 mm  superior to the superior margin of the larger mass and extends anteriorly to just behind the nipple from the more anterior mass. The overall size of the masses and non mass enhancement covers approximately 8 x 4 x 7 cm, without significant change from the prior exam. There are no new areas of abnormal left breast enhancement. Lymph nodes: Abnormal left axillary lymph nodes are unchanged in size and configuration compared to the prior study. No abnormal right axillary lymph nodes. Ancillary findings:  None. IMPRESSION: 1. No significant interval imaging response to neoadjuvant chemotherapy. There has been no change in the size of the left breast masses or extent of the non mass enhancement. 2. There are no new areas of abnormal enhancement to suggest additional left breast malignancy. 3. Decrease in the enhancement surrounding the biopsy clip of the right breast complex sclerosing lesion. There is no evidence of right breast carcinoma. RECOMMENDATION: Treatment as planned for the known extensive left breast carcinoma and metastatic left axillary adenopathy. BI-RADS CATEGORY  6: Known  biopsy-proven malignancy. Electronically Signed   By: Lajean Manes M.D.   On: 08/10/2017 15:10    ELIGIBLE FOR AVAILABLE RESEARCH PROTOCOL: No  ASSESSMENT: 67 y.o. [place] woman status post bilateral biopsies 01/26/2017, showing  (1) in the right breast, a complex sclerosing lesion  (2) in the left breast, a cT2 pN1 invasive ductal carcinoma (with some lobular features but E-cadherin positive), grade 2, estrogen and progesterone receptor positive, HER-2 not amplified, with an MIB-1 of 15-20%  (a) breast MRI 02/04/2017 suggests a T3 N1 tumor  (3) started neoadjuvant cyclophosphamide/docetaxel 03/03/2017, discontinued after 1 cycle with very poor tolerance  (a) started cyclophosphamide/methotrexate/fluorouracil (CMF) 03/28/2017, to be repeated for 7 cycles 21 days apart  (4) definitive surgery to follow  (5) adjuvant radiation to follow surgery  ( 6) to start antiestrogens at the completion of local treatment  (7) consider genetics counseling  PLAN: Brooke [name] is doing well today. I reviewed her CBC with her which was stable.  We reviewed her MRI results, which did not show any change in her breast tumor.  She has an appointment with Dr. Marlou Starks for surgical planning on 6/25.  She met with Dr. [name] today who reviewed her MRI with her as well.  Brooke [name] would like to meet with Dr. Harlow Mares to discuss reconstruction, since a mastectomy is likely going to be her surgical need.  We will set that up for her.    We will see [facility] after surgery.  She knows to call for any other issues that may develop before the next visit.   Brooke Bihari, NP  08/14/17 1:25 PM Medical Oncology and Hematology [facility] [address] [place], [place] [address] Tel. [phone]    Fax. [phone]    ADDENDUM: Catie has completed her neoadjuvant chemotherapy.  She was not able to tolerate standard treatment and did receive CMF therapy.  She did not have any significant change in her measurable  disease in the breast.  She also had no growth of the measurable disease.  Today we discussed that the purpose of chemotherapy is really to sterilize the peripheral disease that may be present.  If the ultimate goal were to obliterate the tumor in the breast we could simply remove the tumor from the breast, which of course we will do.  It is true that when we see a very good response in the breast we also anticipate a very good response peripherally.  However we do not interrupt  chemotherapy and proceed to surgery unless there is evidence of tumor growth, which is not the case here.  In any case she is ready for surgery at this point.  Because her cancer is estrogen receptor positive she will not be a candidate for the pembrolizumab study and we therefore should remove her port.  Finally she recently learned that 1 of her sisters has been diagnosed with endometrial cancer, given one sister with breast, and relatives with prostate and colon cancer, I wonder if genetics testing is appropriate and I am inquiring of our genetics counselors regarding that.  I personally saw this patient and performed a substantive portion of this encounter with the listed APP documented above.   Chauncey Cruel, MD Medical Oncology and Hematology [facility] [address] [place], [place] [address] Tel. [phone]    Fax. [phone]

## 2017-08-14 NOTE — Progress Notes (Unsigned)
[place]  [phone] [phone]     ID: EUNIE LAWN DOB: Apr 01, 1950  MR#: [id number]  XBD#:[id number]  Patient Care Team: [name], DO as PCP - General (Family Medicine) Phineas Real, Belinda Block, MD (Obstetrics and Gynecology) Rolm Bookbinder, MD as Attending Physician (Dermatology) Jahara Dail, Virgie Dad, MD as Consulting Physician (Oncology) Kyung Rudd, MD as Consulting Physician (Radiation Oncology) Jovita Kussmaul, MD as Consulting Physician (General Surgery) Irene Limbo, MD as Consulting Physician (Plastic Surgery) OTHER MD:  CHIEF COMPLAINT: Estrogen receptor positive breast cancer  CURRENT TREATMENT: Neoadjuvant chemotherapy [TC then CMF]  HISTORY OF CURRENT ILLNESS: From the original intake note:  Deshanna herself palpated a change in her left breast and brought this to medical attention.  On 01/18/2017 she underwent bilateral diagnostic mammography with tomography and bilateral breast ultrasonography at [facility].  The breast density was category B.  At the most recent exam was from December 2016.  In the right breast upper outer quadrant there was a 1 cm irregular mass associated with punctate calcification.  By ultrasound this was not well-defined, and it was biopsied on 01/26/2017 with tomographic guidance this showed a complex sclerosing lesion with the usual ductal hyperplasia.  The right axilla was sonographically  On the left mammography showed a 4 cm dense mass with indistinct margins in the upper inner quadrant.  Ultrasound defined a 3 cm irregular mass in the upper inner quadrant of the left breast which was palpable.  There was an abnormal lymph node in the left axilla.  Biopsy of the left mass and abnormal lymph node ([id number], on 01/26/2017) showed both to be involved by invasive ductal carcinoma, with some lobular features but E-cadherin positive, estrogen and progesterone receptors both 100% positive, with strong staining  intensity, with MIB-1 between 15-20%.  There was no HER-2 amplification, the signals ratio being 1.31/1.58 and the number per cell 2.30/2.05  The patient's subsequent history is as detailed below.  [place] returns today for follow-up and treatment of her estrogen receptor positive breast cancer.      REVIEW OF SYSTEMS: [facility]     PAST MEDICAL HISTORY: Past Medical History:  Diagnosis Date  . Anemia   . Anxiety   . Arthritis   . Bilateral dry eyes   . Cancer ([place])   . Family history of breast cancer 02/02/2017  . Family history of colon cancer 02/02/2017  . Family history of prostate cancer in father 02/02/2017  . Fibroid   . History of kidney stones   . Hyperlipidemia   . Kidney stones   . Melanoma of upper arm ([place]) 2009   RIGHT ARM ; left breast cancer, squamous cell carcinoma of the skin  . Obesity (BMI 30.0-34.9)   . Osteopenia 05/2015   T score -1.3 FRAX 6%/0.5% stable from prior DEXA  . PONV (postoperative nausea and vomiting)   . UTI (urinary tract infection)   . Varicose veins of both lower extremities   . Vitamin D deficiency 07/2009   LOW VITAMIN D 29  . Vitamin deficiency 07/2008   VITAMIN D LOW 23    PAST SURGICAL HISTORY: Past Surgical History:  Procedure Laterality Date  . ABDOMINAL SURGERY  1975   ovairan cyst   . APPENDECTOMY    . BREAST SURGERY     bio  . CHOLECYSTECTOMY    . DERMOID CYST  EXCISION    . DILATION AND CURETTAGE OF UTERUS  1998  . GALLBLADDER SURGERY    .  HYSTEROSCOPY    . insertion  port a cath  03/02/2016  . PORTACATH PLACEMENT N/A 03/02/2017   Procedure: INSERTION PORT-A-CATH;  Surgeon: Jovita Kussmaul, MD;  Location: [place];  Service: General;  Laterality: N/A;  . SKIN BIOPSY    . SKIN CANCER EXCISION  2010,2011   2010-RIGHT ARM, 2011-LEFT LOWER LEG.-DR Rolm Bookbinder DERM. DR. Sarajane Jews IS HER DERM SURGEON.    FAMILY HISTORY Family History  Problem Relation Age of Onset  . Diabetes Father   . Hypertension Father   .  Heart disease Father   . Prostate cancer Father 67  . Breast cancer Sister 67       again at age 67, GT results unk  . Skin cancer Sister   . Colon cancer Paternal Grandfather 67  . Hypertension Mother   . Heart failure Mother   The patient's father was diagnosed with prostate cancer in his 20s and died at age 67 from complications of that disease.  The patient's mother died at age 67 with congestive heart failure.  The patient had no brothers, 3 sisters.  One sister had breast cancer at age 67 and again at age 67.  She has been genetically tested and was found to be BRCA not mutated in addition a paternal grandfather had colon cancer in his 46s.  There is no history of ovarian cancer in the family to the patient's knowledge.  GYNECOLOGIC HISTORY:  Patient's last menstrual period was 03/18/1997. Menarche age 19. The patient is GX P0.  She stopped having periods in 1999 and took hormone replacement approximately 5 years.  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 She is single and lives alone with no pets.    ADVANCED DIRECTIVES:    HEALTH MAINTENANCE: Social History   Tobacco Use  . Smoking status: Never Smoker  . Smokeless tobacco: Never Used  Substance Use Topics  . Alcohol use: No    Frequency: Never    Comment: rare  . Drug use: No     Colonoscopy: Never  PAP:  Bone density: 2017/osteopenia   Allergies  Allergen Reactions  . Neosporin [Neomycin-Bacitracin Zn-Polymyx] Swelling, Rash and Other (See Comments)    Blisters  . Benzalkonium Chloride Other (See Comments)    Redness  . Latex Rash    Current Outpatient Medications  Medication Sig Dispense Refill  . acetaminophen (TYLENOL) 500 MG tablet Take 500 mg by mouth every 6 (six) hours as needed for moderate pain or headache.    . alclomethasone (ACLOVATE) 0.05 % cream Apply to affected area once daily  as needed for dermatitis  0  . cephALEXin (KEFLEX) 500 MG capsule Take 1 capsule (500 mg total) by mouth 3 (three) times daily. 21 capsule 0  . dexamethasone (DECADRON) 4 MG tablet Take 2 tablets (8 mg total) by mouth 2 (two) times daily with a meal. 8 tablet 0  . Hyprom-Naphaz-Polysorb-Zn Sulf (CLEAR EYES COMPLETE OP) Place 1-2 drops into both eyes daily as needed (for dry eyes).    Marland Kitchen ibuprofen (ADVIL,MOTRIN) 200 MG tablet Take 200 mg by mouth every 6 (six) hours as needed for headache or moderate pain.    . promethazine (PHENERGAN) 25 MG suppository Place 1 suppository (25 mg total) rectally every 6 (six) hours as needed for nausea or vomiting. 12 each 0   No current facility-administered medications for this visit.     OBJECTIVE:  There were no vitals filed for this visit.   There is no height or weight on file to calculate BMI.   Wt Readings from Last 3 Encounters:  08/01/17 231 lb (104.8 kg)  07/18/17 228 lb (103.4 kg)  06/27/17 226 lb 1.6 oz (102.6 kg)      ECOG FS:1 - Symptomatic but completely ambulatory GENERAL: Patient is a well appearing female in no acute distress HEENT:  Sclerae anicteric.  Oropharynx clear and moist. No ulcerations or evidence of oropharyngeal candidiasis. Neck is supple.  NODES:  No cervical, supraclavicular, or axillary lymphadenopathy palpated.  BREAST EXAM:  Deferred. LUNGS:  Clear to auscultation bilaterally.  No wheezes or rhonchi. HEART:  Regular rate and rhythm. No murmur appreciated. ABDOMEN:  Soft, nontender.  Positive, normoactive bowel sounds. No organomegaly palpated. MSK:  No focal spinal tenderness to palpation. Full range of motion bilaterally in the upper extremities. EXTREMITIES:  No peripheral edema.   SKIN:  Clear with no obvious rashes or skin changes. No nail dyscrasia. NEURO:  Nonfocal. Well oriented.  Appropriate affect.    LAB RESULTS:  CMP     Component Value Date/Time   NA 140 08/01/2017 0951   NA 141 03/03/2017 1358    K 4.0 08/01/2017 0951   K 3.3 (L) 03/03/2017 1358   CL 105 08/01/2017 0951   CO2 26 08/01/2017 0951   CO2 28 03/03/2017 1358   GLUCOSE 92 08/01/2017 0951   GLUCOSE 107 03/03/2017 1358   BUN 16 08/01/2017 0951   BUN 12.7 03/03/2017 1358   CREATININE 0.84 08/01/2017 0951   CREATININE 0.79 07/11/2017 1040   CREATININE 0.9 03/03/2017 1358   CALCIUM 9.7 08/01/2017 0951   CALCIUM 9.4 03/03/2017 1358   PROT 6.9 08/01/2017 0951   PROT 6.5 03/03/2017 1358   ALBUMIN 4.1 08/01/2017 0951   ALBUMIN 3.7 03/03/2017 1358   AST 13 08/01/2017 0951   AST 17 07/11/2017 1040   AST 23 03/03/2017 1358   ALT 9 08/01/2017 0951   ALT 19 07/11/2017 1040   ALT 25 03/03/2017 1358   ALKPHOS 95 08/01/2017 0951   ALKPHOS 71 03/03/2017 1358   BILITOT 0.5 08/01/2017 0951   BILITOT 0.6 07/11/2017 1040   BILITOT 0.52 03/03/2017 1358   GFRNONAA >60 08/01/2017 0951   GFRNONAA >60 07/11/2017 1040   GFRAA >60 08/01/2017 0951   GFRAA >60 07/11/2017 1040    No results found for: Ronnald Ramp, A1GS, A2GS, BETS, BETA2SER, GAMS, MSPIKE, SPEI  No results found for: Nils Pyle, [facility]  Lab Results  Component Value Date   WBC 3.0 (L) 08/14/2017   NEUTROABS 1.9 08/14/2017   HGB 12.5 08/14/2017   HCT 37.6 08/14/2017   MCV 88.6 08/14/2017   PLT 208 08/14/2017      Chemistry      Component Value Date/Time   NA 140 08/01/2017 0951   NA 141 03/03/2017 1358   K 4.0 08/01/2017 0951   K 3.3 (L) 03/03/2017 1358   CL 105 08/01/2017 0951   CO2 26 08/01/2017 0951   CO2 28 03/03/2017 1358   BUN 16 08/01/2017 0951   BUN 12.7 03/03/2017 1358   CREATININE 0.84 08/01/2017 0951   CREATININE 0.79 07/11/2017 1040   CREATININE 0.9 03/03/2017 1358      Component Value Date/Time   CALCIUM 9.7 08/01/2017 0951   CALCIUM 9.4 03/03/2017 1358   ALKPHOS 95 08/01/2017 0951   ALKPHOS 71 03/03/2017 1358   AST 13 08/01/2017 0951   AST 17  07/11/2017 1040   AST 23 03/03/2017 1358   ALT 9  08/01/2017 0951   ALT 19 07/11/2017 1040   ALT 25 03/03/2017 1358   BILITOT 0.5 08/01/2017 0951   BILITOT 0.6 07/11/2017 1040   BILITOT 0.52 03/03/2017 1358       No results found for: LABCA2  No components found for: BJSEGB151  No results for input(s): INR in the last 168 hours.  No results found for: LABCA2  No results found for: VOH607  No results found for: PXT062  No results found for: IRS854  No results found for: CA2729  No components found for: HGQUANT  No results found for: CEA1 / No results found for: CEA1   No results found for: AFPTUMOR  No results found for: CHROMOGRNA  No results found for: PSA1  Appointment on 08/14/2017  Component Date Value Ref Range Status  . WBC Count 08/14/2017 3.0* 3.9 - 10.3 K/uL Final  . RBC 08/14/2017 4.24  3.70 - 5.45 MIL/uL Final  . Hemoglobin 08/14/2017 12.5  11.6 - 15.9 g/dL Final  . HCT 08/14/2017 37.6  34.8 - 46.6 % Final  . MCV 08/14/2017 88.6  79.5 - 101.0 fL Final  . MCH 08/14/2017 29.5  25.1 - 34.0 pg Final  . MCHC 08/14/2017 33.3  31.5 - 36.0 g/dL Final  . RDW 08/14/2017 14.6* 11.2 - 14.5 % Final  . Platelet Count 08/14/2017 208  145 - 400 K/uL Final  . Neutrophils Relative % 08/14/2017 63  % Final  . Neutro Abs 08/14/2017 1.9  1.5 - 6.5 K/uL Final  . Lymphocytes Relative 08/14/2017 20  % Final  . Lymphs Abs 08/14/2017 0.6* 0.9 - 3.3 K/uL Final  . Monocytes Relative 08/14/2017 11  % Final  . Monocytes Absolute 08/14/2017 0.3  0.1 - 0.9 K/uL Final  . Eosinophils Relative 08/14/2017 4  % Final  . Eosinophils Absolute 08/14/2017 0.1  0.0 - 0.5 K/uL Final  . Basophils Relative 08/14/2017 2  % Final  . Basophils Absolute 08/14/2017 0.1  0.0 - 0.1 K/uL Final   Performed at [facility], [address] [place], [place] [address]    (this displays the last labs from the last 3 days)  No results found for: TOTALPROTELP, ALBUMINELP, A1GS, A2GS, BETS, BETA2SER, GAMS, MSPIKE, SPEI (this  displays SPEP labs)  No results found for: KPAFRELGTCHN, LAMBDASER, KAPLAMBRATIO (kappa/lambda light chains)  No results found for: HGBA, HGBA2QUANT, HGBFQUANT, HGBSQUAN (Hemoglobinopathy evaluation)   No results found for: LDH  No results found for: IRON, TIBC, IRONPCTSAT (Iron and TIBC)  No results found for: FERRITIN  Urinalysis    Component Value Date/Time   COLORURINE YELLOW 05/29/2015 [place] 05/29/2015 1113   LABSPEC 1.023 05/29/2015 1113   PHURINE 5.5 05/29/2015 1113   [place] 05/29/2015 1113   GLUCOSEU NEGATIVE 11/05/2012 1559   HGBUR NEGATIVE 05/29/2015 1113   BILIRUBINUR negative 02/10/2017 1622   KETONESUR NEGATIVE 05/29/2015 1113   PROTEINUR negative 02/10/2017 1622   PROTEINUR NEGATIVE 05/29/2015 1113   UROBILINOGEN 0.2 02/10/2017 1622   UROBILINOGEN 0.2 11/05/2012 1559   NITRITE negative 02/10/2017 1622   NITRITE NEGATIVE 05/29/2015 1113   LEUKOCYTESUR Negative 02/10/2017 1622     STUDIES: Mr Breast Bilateral W Wo Contrast Inc Cad  Result Date: 08/10/2017 CLINICAL DATA:  Assess for response following neoadjuvant chemotherapy. Dx of malignant neoplasm of overlapping sites of left breast November 2018. Estrogen receptor positive. Bx on both breasts November 2018.  Mammogram at [facility] Nov 2018. Also hx of melanoma right upper extremity. Has had 8 rounds of chemo starting in Jan 2019. Family hx of breast cancer (sister age 19s or 74s.) Felt lumps in both breasts Nov 2018 and having some pain and soreness in left breast. Patient also had a lesion in the right breast biopsied revealing a complex sclerosing lesion. LABS:  No labs drawn at time of imaging. EXAM: BILATERAL BREAST MRI WITH AND WITHOUT CONTRAST TECHNIQUE: Multiplanar, multisequence MR images of both breasts were obtained prior to and following the intravenous administration of 20 ml of MultiHance. THREE-DIMENSIONAL MR IMAGE RENDERING ON INDEPENDENT WORKSTATION: Three-dimensional  MR images were rendered by post-processing of the original MR data on an independent workstation. The three-dimensional MR images were interpreted, and findings are reported in the following complete MRI report for this study. Three dimensional images were evaluated at the independent DynaCad workstation COMPARISON:  Breast MRI, 02/04/2017. Prior mammograms and ultrasounds. FINDINGS: Breast composition: b. Scattered fibroglandular tissue. Background parenchymal enhancement: Mild Right breast: Focal enhancement lies adjacent to the biopsy clip artifact in the central, lateral right breast. This now spans a maximum of 10 mm, decreased in size from the prior breast MRI. There are no other areas of abnormal right breast enhancement. Left breast: Abnormal enhancement persists in the left breast. This appears to be a combination of 2 adjacent masses and intervening non mass enhancement. The dominant mass, irregular in shape with irregular margins, lies in the central upper inner quadrant measuring 3.6 x 2.5 x 2.8 cm. Anterior to this, in the retroareolar breast, a second irregular mass measures 16 x 13 x 16 mm. There is non mass enhancement that extends between the 2 masses, protrudes 11 mm superior to the superior margin of the larger mass and extends anteriorly to just behind the nipple from the more anterior mass. The overall size of the masses and non mass enhancement covers approximately 8 x 4 x 7 cm, without significant change from the prior exam. There are no new areas of abnormal left breast enhancement. Lymph nodes: Abnormal left axillary lymph nodes are unchanged in size and configuration compared to the prior study. No abnormal right axillary lymph nodes. Ancillary findings:  None. IMPRESSION: 1. No significant interval imaging response to neoadjuvant chemotherapy. There has been no change in the size of the left breast masses or extent of the non mass enhancement. 2. There are no new areas of abnormal  enhancement to suggest additional left breast malignancy. 3. Decrease in the enhancement surrounding the biopsy clip of the right breast complex sclerosing lesion. There is no evidence of right breast carcinoma. RECOMMENDATION: Treatment as planned for the known extensive left breast carcinoma and metastatic left axillary adenopathy. BI-RADS CATEGORY  6: Known biopsy-proven malignancy. Electronically Signed   By: Lajean Manes M.D.   On: 08/10/2017 15:10    ELIGIBLE FOR AVAILABLE RESEARCH PROTOCOL: No  ASSESSMENT: 67 y.o. [place] woman status post bilateral biopsies 01/26/2017, showing  (1) in the right breast, a complex sclerosing lesion  (2) in the left breast, a cT2 pN1 invasive ductal carcinoma (with some lobular features but E-cadherin positive), grade 2, estrogen and progesterone receptor positive, HER-2 not amplified, with an MIB-1 of 15-20%  (a) breast MRI 02/04/2017 suggests a T3 N1 tumor  (3) started neoadjuvant cyclophosphamide/docetaxel 03/03/2017, discontinued after 1 cycle with very poor tolerance  (a) started cyclophosphamide/methotrexate/fluorouracil (CMF) 03/28/2017, to be repeated for 7 cycles 21 days apart  (4) definitive surgery to follow  (  5) adjuvant radiation to follow surgery  ( 6) to start antiestrogens at the completion of local treatment  PLAN: [place]  She knows to call for any other issues that may develop before the next visit.   Wilber Bihari, NP  08/14/17 12:59 PM Medical Oncology and Hematology [facility] [address] [place], [place] [address] Tel. [phone]    Fax. [phone]

## 2017-08-17 ENCOUNTER — Telehealth: Payer: Self-pay | Admitting: *Deleted

## 2017-08-17 DIAGNOSIS — C50812 Malignant neoplasm of overlapping sites of left female breast: Secondary | ICD-10-CM

## 2017-08-17 DIAGNOSIS — L03116 Cellulitis of left lower limb: Secondary | ICD-10-CM

## 2017-08-17 DIAGNOSIS — Z170 Estrogen receptor positive status [ER+]: Principal | ICD-10-CM

## 2017-08-17 NOTE — Telephone Encounter (Signed)
This RN spoke with pt per her call stating she has had swelling bilaterally in ankles - " I can get the right leg to decrease but the left seems more swollen ".  Brooke [name] states " my chemotherapy sheets tell me to call if I have swelling "  Per phone discussion- Brooke [name] was able to inform this RN that the swelling " does not leave an impression " when this RN asked her to press her finger into the area of swelling.  She does not have a focal area of pain in the left leg but does have discomfort starting behind the knee and down " just feels tight "  Per discussion pt has been elevating her legs but she has not been hydrating as well ( she thought she should cut back on fluids due to retention ).  This RN informed pt of need to hydrate well but avoid salt intake. She will continue to elevate.  Above will be reviewed with provider for possible other follow up.  Of note pt inquired about referral to Dr Harlow Mares per her appointment here last Monday.  This RN contacted Dr Harlow Mares and faxed office notes for review for scheduling of an appointment,

## 2017-08-18 ENCOUNTER — Ambulatory Visit (HOSPITAL_COMMUNITY)
Admission: RE | Admit: 2017-08-18 | Discharge: 2017-08-18 | Disposition: A | Discharge status: Home / Self Care | Payer: Medicare Other | Source: Ambulatory Visit | Attending: Oncology | Admitting: Oncology

## 2017-08-18 ENCOUNTER — Telehealth: Payer: Self-pay | Admitting: *Deleted

## 2017-08-18 DIAGNOSIS — C50812 Malignant neoplasm of overlapping sites of left female breast: Secondary | ICD-10-CM | POA: Insufficient documentation

## 2017-08-18 DIAGNOSIS — L03116 Cellulitis of left lower limb: Secondary | ICD-10-CM | POA: Diagnosis not present

## 2017-08-18 DIAGNOSIS — Z170 Estrogen receptor positive status [ER+]: Secondary | ICD-10-CM | POA: Insufficient documentation

## 2017-08-18 NOTE — Telephone Encounter (Signed)
This RN spoke with pt post her having doppler study with negative results for left leg swelling.  Note per RN discussion and assessment noted left leg was swollen with non pitting but firm texture.  Noted area on front of calf of redness- per touch mildly increased in warmth, with some noted area of pending redness in back of calf.  Bettejane says area " comes and goes and my dermatologist gave me some cream but I haven't been using it but then I spoke with her and she said to start using it ".  Shoua states she also was seen with a prior chemo treatment by Sandi Mealy and given a prescription for keflex for noted area.  She didn't start- area improved and at MD visit was advised not to use.  Per above and review with LCC/NP pt advised to start the Keflex she has on hand which is Kelfex 500 mg , take TID dispensed 21 tabs.  Pt is scheduled to see Dr Marlou Starks next week and understands to show area to him as well.

## 2017-08-18 NOTE — Progress Notes (Signed)
Left lower extremity venous duplex completed. There is no evidence of a DVT or Baker's cyst. Toma Copier, RVS 08/18/2017, 9:18 AM

## 2017-08-21 ENCOUNTER — Telehealth: Payer: Self-pay

## 2017-08-21 NOTE — Telephone Encounter (Signed)
Received call from Dr Harlow Mares office at [facility] stating they have been unable to get in touch with pt to schedule an appt regarding referral.  Confirmed correct contact number for pt.   This RN called pt's work number and lvm for pt to return my call. Pt needs to contact Dr Harlow Mares office at [phone] as they would like to see her next Monday

## 2017-08-22 ENCOUNTER — Ambulatory Visit: Payer: Self-pay | Admitting: General Surgery

## 2017-08-22 ENCOUNTER — Encounter: Payer: Self-pay | Admitting: Oncology

## 2017-08-22 DIAGNOSIS — Z170 Estrogen receptor positive status [ER+]: Principal | ICD-10-CM

## 2017-08-22 DIAGNOSIS — C50812 Malignant neoplasm of overlapping sites of left female breast: Secondary | ICD-10-CM

## 2017-08-25 ENCOUNTER — Telehealth: Payer: Self-pay | Admitting: Oncology

## 2017-08-25 NOTE — Telephone Encounter (Signed)
Called pt re appts that were added per 6/28 sch msg - attempted to leave vm for pt re appts.

## 2017-08-28 ENCOUNTER — Other Ambulatory Visit: Payer: Medicare Other

## 2017-08-28 ENCOUNTER — Ambulatory Visit: Payer: Medicare Other | Admitting: Oncology

## 2017-09-05 NOTE — Telephone Encounter (Signed)
No entry 

## 2017-09-08 ENCOUNTER — Telehealth: Payer: Self-pay | Admitting: *Deleted

## 2017-09-08 ENCOUNTER — Telehealth: Payer: Self-pay | Admitting: Oncology

## 2017-09-08 NOTE — Telephone Encounter (Signed)
Second cal received from patient to ensure staff aware of request to cancel 09-12-2017 appointments.  "Didn't know about this appointment until reminder call received today.  Very interested but unable to make this date and time.  Just received letter from Dr. [name] in reference to seeing Genetic Department in three months. I also will be having extensive surgery July 29 th.  It may be September before I could come in.  Would like to meet someone with genetics farther out.  Message was left for Tonya the first time I called but still would like to speak with someone."    Confirmed [phone] is the correct phone number on file to reach patient.  This nurse cancelled 09-12-2017 appointments.  Routing call information to [employer] and support staff.

## 2017-09-08 NOTE — Telephone Encounter (Signed)
Returned call to patient.  She requested to schedule Genetics appointment   For end of September.  Appt scheduled & letter w/ Calendar mailed to patient per 7/12 patient message/ staff message

## 2017-09-08 NOTE — Telephone Encounter (Signed)
IB message sent to Dr [name] regarding scheduling of Genetics appointment based on letter patient received from [facility]

## 2017-09-11 ENCOUNTER — Other Ambulatory Visit: Payer: Self-pay | Admitting: *Deleted

## 2017-09-11 DIAGNOSIS — Z170 Estrogen receptor positive status [ER+]: Principal | ICD-10-CM

## 2017-09-11 DIAGNOSIS — C50812 Malignant neoplasm of overlapping sites of left female breast: Secondary | ICD-10-CM

## 2017-09-12 ENCOUNTER — Inpatient Hospital Stay: Payer: Medicare Other | Admitting: Genetics

## 2017-09-12 ENCOUNTER — Inpatient Hospital Stay: Payer: Medicare Other

## 2017-09-15 NOTE — Pre-Procedure Instructions (Signed)
Brooke [name]  09/15/2017      [id number] [address], [address] AT [address] [place] [address] Phone: [phone] Fax: [phone]    Your procedure is scheduled on July 29  Report to [facility] at 9:15 A.M.  Call this number if you have problems the morning of surgery:  [phone]   Remember:  Do not eat or drink after midnight.   You may drink clear liquids until 8:15.  Clear liquids allowed are:                    Water, Juice (non-citric and without pulp), Carbonated beverages, Clear Tea, Black Coffee only, Plain Jell-O only and Gatorade    Take these medicines the morning of surgery with A SIP OF WATER :               Tylenol if needed               Eye drops if needed              7 days prior to surgery STOP taking any Aspirin(unless otherwise instructed by your surgeon), Aleve, Naproxen, Ibuprofen, Motrin, Advil, Goody's, BC's, all herbal medications, fish oil, and all vitamins    Do not wear jewelry, make-up or nail polish.  Do not wear lotions, powders, or perfumes, or deodorant.  Do not shave 48 hours prior to surgery.  Men may shave face and neck.  Do not bring valuables to the hospital.  [facility] is not responsible for any belongings or valuables.  Contacts, dentures or bridgework may not be worn into surgery.  Leave your suitcase in the car.  After surgery it may be brought to your room.  For patients admitted to the hospital, discharge time will be determined by your treatment team.  Patients discharged the day of surgery will not be allowed to drive home.    Special instructions:  [facility]- Preparing For Surgery  Before surgery, you can play an important role. Because skin is not sterile, your skin needs to be as free of germs as possible. You can reduce the number of germs on your skin by washing with CHG (chlorahexidine gluconate)  Soap before surgery.  CHG is an antiseptic cleaner which kills germs and bonds with the skin to continue killing germs even after washing.    Oral Hygiene is also important to reduce your risk of infection.  Remember - BRUSH YOUR TEETH THE MORNING OF SURGERY WITH YOUR REGULAR TOOTHPASTE  Please do not use if you have an allergy to CHG or antibacterial soaps. If your skin becomes reddened/irritated stop using the CHG.  Do not shave (including legs and underarms) for at least 48 hours prior to first CHG shower. It is OK to shave your face.  Please follow these instructions carefully.   1. Shower the NIGHT BEFORE SURGERY and the MORNING OF SURGERY with CHG.   2. If you chose to wash your hair, wash your hair first as usual with your normal shampoo.  3. After you shampoo, rinse your hair and body thoroughly to remove the shampoo.  4. Use CHG as you would any other liquid soap. You can apply CHG directly to the skin and wash gently with a scrungie or a clean washcloth.   5. Apply the CHG Soap to your body ONLY FROM THE NECK DOWN.  Do  not use on open wounds or open sores. Avoid contact with your eyes, ears, mouth and genitals (private parts). Wash Face and genitals (private parts)  with your normal soap.  6. Wash thoroughly, paying special attention to the area where your surgery will be performed.  7. Thoroughly rinse your body with warm water from the neck down.  8. DO NOT shower/wash with your normal soap after using and rinsing off the CHG Soap.  9. Pat yourself dry with a CLEAN TOWEL.  10. Wear CLEAN PAJAMAS to bed the night before surgery, wear comfortable clothes the morning of surgery  11. Place CLEAN SHEETS on your bed the night of your first shower and DO NOT SLEEP WITH PETS.    Day of Surgery:  Do not apply any deodorants/lotions.  Please wear clean clothes to the hospital/surgery center.   Remember to brush your teeth WITH YOUR REGULAR TOOTHPASTE.    Please read over  the following fact sheets that you were given. Coughing and Deep Breathing and Surgical Site Infection Prevention

## 2017-09-18 ENCOUNTER — Encounter (HOSPITAL_COMMUNITY): Payer: Self-pay

## 2017-09-18 ENCOUNTER — Encounter (HOSPITAL_COMMUNITY)
Admission: RE | Admit: 2017-09-18 | Discharge: 2017-09-18 | Disposition: A | Discharge status: Home / Self Care | Payer: Medicare Other | Source: Ambulatory Visit | Attending: General Surgery | Admitting: General Surgery

## 2017-09-18 DIAGNOSIS — C50812 Malignant neoplasm of overlapping sites of left female breast: Secondary | ICD-10-CM | POA: Diagnosis not present

## 2017-09-18 DIAGNOSIS — Z170 Estrogen receptor positive status [ER+]: Secondary | ICD-10-CM | POA: Insufficient documentation

## 2017-09-18 DIAGNOSIS — Z01818 Encounter for other preprocedural examination: Secondary | ICD-10-CM | POA: Insufficient documentation

## 2017-09-18 LAB — CBC
HEMATOCRIT: 40.2 % (ref 36.0–46.0)
Hemoglobin: 12.6 g/dL (ref 12.0–15.0)
MCH: 29 pg (ref 26.0–34.0)
MCHC: 31.3 g/dL (ref 30.0–36.0)
MCV: 92.4 fL (ref 78.0–100.0)
PLATELETS: 259 10*3/uL (ref 150–400)
RBC: 4.35 MIL/uL (ref 3.87–5.11)
RDW: 13.2 % (ref 11.5–15.5)
WBC: 5.2 10*3/uL (ref 4.0–10.5)

## 2017-09-18 LAB — BASIC METABOLIC PANEL
Anion gap: 9 (ref 5–15)
BUN: 9 mg/dL (ref 8–23)
CALCIUM: 9.4 mg/dL (ref 8.9–10.3)
CO2: 25 mmol/L (ref 22–32)
CREATININE: 0.74 mg/dL (ref 0.44–1.00)
Chloride: 107 mmol/L (ref 98–111)
GLUCOSE: 94 mg/dL (ref 70–99)
Potassium: 3.6 mmol/L (ref 3.5–5.1)
Sodium: 141 mmol/L (ref 135–145)

## 2017-09-18 MED ORDER — CHLORHEXIDINE GLUCONATE CLOTH 2 % EX PADS: 6.0000 | MEDICATED_PAD | Freq: Once | CUTANEOUS | Status: DC

## 2017-09-25 ENCOUNTER — Encounter (HOSPITAL_COMMUNITY): Payer: Self-pay | Admitting: *Deleted

## 2017-09-25 ENCOUNTER — Ambulatory Visit (HOSPITAL_COMMUNITY): Payer: Medicare Other | Admitting: Certified Registered"

## 2017-09-25 ENCOUNTER — Ambulatory Visit (HOSPITAL_COMMUNITY)
Admission: RE | Admit: 2017-09-25 | Discharge: 2017-09-26 | Disposition: A | Discharge status: Home / Self Care | Payer: Medicare Other | Source: Ambulatory Visit | Attending: General Surgery | Admitting: General Surgery

## 2017-09-25 ENCOUNTER — Encounter (HOSPITAL_COMMUNITY)
Admission: RE | Disposition: A | Discharge status: Home / Self Care | Payer: Self-pay | Source: Ambulatory Visit | Attending: General Surgery

## 2017-09-25 ENCOUNTER — Other Ambulatory Visit: Payer: Self-pay

## 2017-09-25 DIAGNOSIS — N6021 Fibroadenosis of right breast: Secondary | ICD-10-CM | POA: Diagnosis not present

## 2017-09-25 DIAGNOSIS — F419 Anxiety disorder, unspecified: Secondary | ICD-10-CM | POA: Diagnosis not present

## 2017-09-25 DIAGNOSIS — Z79899 Other long term (current) drug therapy: Secondary | ICD-10-CM | POA: Diagnosis not present

## 2017-09-25 DIAGNOSIS — Z170 Estrogen receptor positive status [ER+]: Secondary | ICD-10-CM | POA: Insufficient documentation

## 2017-09-25 DIAGNOSIS — N6489 Other specified disorders of breast: Secondary | ICD-10-CM | POA: Insufficient documentation

## 2017-09-25 DIAGNOSIS — C50912 Malignant neoplasm of unspecified site of left female breast: Secondary | ICD-10-CM | POA: Diagnosis present

## 2017-09-25 DIAGNOSIS — C50212 Malignant neoplasm of upper-inner quadrant of left female breast: Secondary | ICD-10-CM | POA: Insufficient documentation

## 2017-09-25 DIAGNOSIS — C773 Secondary and unspecified malignant neoplasm of axilla and upper limb lymph nodes: Secondary | ICD-10-CM | POA: Diagnosis not present

## 2017-09-25 HISTORY — DX: Reserved for concepts with insufficient information to code with codable children: IMO0002

## 2017-09-25 HISTORY — DX: Malignant neoplasm of unspecified site of left female breast: C50.912

## 2017-09-25 HISTORY — PX: BREAST LUMPECTOMY WITH RADIOACTIVE SEED LOCALIZATION: SHX6424

## 2017-09-25 HISTORY — PX: MASTECTOMY MODIFIED RADICAL: SHX5962

## 2017-09-25 SURGERY — MASTECTOMY, MODIFIED RADICAL
Anesthesia: Regional | Site: Breast | Laterality: Right

## 2017-09-25 MED ORDER — KCL IN DEXTROSE-NACL 20-5-0.9 MEQ/L-%-% IV SOLN
INTRAVENOUS | Status: DC
  Administered 2017-09-25: 16:00:00 via INTRAVENOUS
  Filled 2017-09-25 (×2): qty 1000

## 2017-09-25 MED ORDER — EPHEDRINE SULFATE 50 MG/ML IJ SOLN
INTRAMUSCULAR | Status: DC | PRN
  Administered 2017-09-25 (×2): 5 mg via INTRAVENOUS

## 2017-09-25 MED ORDER — PROPOFOL 10 MG/ML IV BOLUS
INTRAVENOUS | Status: AC
  Filled 2017-09-25: qty 20

## 2017-09-25 MED ORDER — BUPIVACAINE-EPINEPHRINE (PF) 0.5% -1:200000 IJ SOLN
INTRAMUSCULAR | Status: DC | PRN
  Administered 2017-09-25: 30 mL

## 2017-09-25 MED ORDER — ONDANSETRON 4 MG PO TBDP: 4.0000 mg | ORAL_TABLET | Freq: Four times a day (QID) | ORAL | Status: DC | PRN

## 2017-09-25 MED ORDER — SUGAMMADEX SODIUM 200 MG/2ML IV SOLN
INTRAVENOUS | Status: AC
  Filled 2017-09-25: qty 2

## 2017-09-25 MED ORDER — HYDROMORPHONE HCL 1 MG/ML IJ SOLN
0.2500 mg | INTRAMUSCULAR | Status: DC | PRN
  Administered 2017-09-25 (×2): 0.5 mg via INTRAVENOUS

## 2017-09-25 MED ORDER — PROPOFOL 10 MG/ML IV BOLUS
INTRAVENOUS | Status: DC | PRN
  Administered 2017-09-25: 120 mg via INTRAVENOUS

## 2017-09-25 MED ORDER — GABAPENTIN 300 MG PO CAPS
300.0000 mg | ORAL_CAPSULE | ORAL | Status: AC
  Administered 2017-09-25: 300 mg via ORAL
  Filled 2017-09-25: qty 1

## 2017-09-25 MED ORDER — FENTANYL CITRATE (PF) 250 MCG/5ML IJ SOLN
INTRAMUSCULAR | Status: AC
Start: 2017-09-25 — End: ?
  Filled 2017-09-25: qty 5

## 2017-09-25 MED ORDER — SCOPOLAMINE 1 MG/3DAYS TD PT72
1.0000 | MEDICATED_PATCH | TRANSDERMAL | Status: DC
Start: 2017-09-25 — End: 2017-09-25

## 2017-09-25 MED ORDER — SCOPOLAMINE 1 MG/3DAYS TD PT72
MEDICATED_PATCH | TRANSDERMAL | Status: AC
  Administered 2017-09-25: 1.5 mg
  Filled 2017-09-25: qty 1

## 2017-09-25 MED ORDER — MIDAZOLAM HCL 5 MG/5ML IJ SOLN
INTRAMUSCULAR | Status: DC | PRN
  Administered 2017-09-25: 2 mg via INTRAVENOUS

## 2017-09-25 MED ORDER — ONDANSETRON HCL 4 MG/2ML IJ SOLN: 4.0000 mg | Freq: Four times a day (QID) | INTRAMUSCULAR | Status: DC | PRN

## 2017-09-25 MED ORDER — DEXAMETHASONE SODIUM PHOSPHATE 4 MG/ML IJ SOLN
INTRAMUSCULAR | Status: DC | PRN
  Administered 2017-09-25: 8 mg via INTRAVENOUS

## 2017-09-25 MED ORDER — FAMOTIDINE IN NACL 20-0.9 MG/50ML-% IV SOLN
20.0000 mg | Freq: Two times a day (BID) | INTRAVENOUS | Status: DC
  Administered 2017-09-25 – 2017-09-26 (×3): 20 mg via INTRAVENOUS
  Filled 2017-09-25 (×3): qty 50

## 2017-09-25 MED ORDER — MORPHINE SULFATE (PF) 2 MG/ML IV SOLN: 1.0000 mg | INTRAVENOUS | Status: DC | PRN

## 2017-09-25 MED ORDER — METHOCARBAMOL 500 MG PO TABS
500.0000 mg | ORAL_TABLET | Freq: Four times a day (QID) | ORAL | Status: DC | PRN
  Administered 2017-09-25: 500 mg via ORAL
  Filled 2017-09-25: qty 1

## 2017-09-25 MED ORDER — SODIUM CHLORIDE 0.9 % IV SOLN
INTRAVENOUS | Status: DC | PRN
  Administered 2017-09-25: 30 ug/min via INTRAVENOUS

## 2017-09-25 MED ORDER — BUPIVACAINE-EPINEPHRINE (PF) 0.25% -1:200000 IJ SOLN
INTRAMUSCULAR | Status: AC
  Filled 2017-09-25: qty 30

## 2017-09-25 MED ORDER — HEPARIN SODIUM (PORCINE) 5000 UNIT/ML IJ SOLN
5000.0000 [IU] | Freq: Three times a day (TID) | INTRAMUSCULAR | Status: DC
  Administered 2017-09-26: 5000 [IU] via SUBCUTANEOUS
  Filled 2017-09-25: qty 1

## 2017-09-25 MED ORDER — MIDAZOLAM HCL 2 MG/2ML IJ SOLN
INTRAMUSCULAR | Status: AC
  Filled 2017-09-25: qty 2

## 2017-09-25 MED ORDER — ACETAMINOPHEN 500 MG PO TABS
1000.0000 mg | ORAL_TABLET | ORAL | Status: AC
  Administered 2017-09-25: 1000 mg via ORAL
  Filled 2017-09-25: qty 2

## 2017-09-25 MED ORDER — LIDOCAINE 2% (20 MG/ML) 5 ML SYRINGE
INTRAMUSCULAR | Status: DC | PRN
  Administered 2017-09-25: 60 mg via INTRAVENOUS

## 2017-09-25 MED ORDER — MIDAZOLAM HCL 2 MG/2ML IJ SOLN
INTRAMUSCULAR | Status: AC
  Administered 2017-09-25: 2 mg
  Filled 2017-09-25: qty 2

## 2017-09-25 MED ORDER — BUPIVACAINE-EPINEPHRINE 0.25% -1:200000 IJ SOLN
INTRAMUSCULAR | Status: DC | PRN
  Administered 2017-09-25: 19 mL

## 2017-09-25 MED ORDER — FENTANYL CITRATE (PF) 100 MCG/2ML IJ SOLN
50.0000 ug | Freq: Once | INTRAMUSCULAR | Status: DC
Start: 2017-09-25 — End: 2017-09-25

## 2017-09-25 MED ORDER — PHENYLEPHRINE 40 MCG/ML (10ML) SYRINGE FOR IV PUSH (FOR BLOOD PRESSURE SUPPORT)
PREFILLED_SYRINGE | INTRAVENOUS | Status: AC
  Filled 2017-09-25: qty 10

## 2017-09-25 MED ORDER — FENTANYL CITRATE (PF) 100 MCG/2ML IJ SOLN
INTRAMUSCULAR | Status: AC
  Administered 2017-09-25: 50 ug
  Filled 2017-09-25: qty 2

## 2017-09-25 MED ORDER — HEPARIN SODIUM (PORCINE) 5000 UNIT/ML IJ SOLN: 5000.0000 [IU] | Freq: Three times a day (TID) | INTRAMUSCULAR | Status: DC

## 2017-09-25 MED ORDER — EPHEDRINE SULFATE 50 MG/ML IJ SOLN
INTRAMUSCULAR | Status: AC
  Filled 2017-09-25: qty 1

## 2017-09-25 MED ORDER — HYDROMORPHONE HCL 1 MG/ML IJ SOLN
INTRAMUSCULAR | Status: AC
  Filled 2017-09-25: qty 1

## 2017-09-25 MED ORDER — LACTATED RINGERS IV SOLN
INTRAVENOUS | Status: DC
  Administered 2017-09-25: 09:00:00 via INTRAVENOUS

## 2017-09-25 MED ORDER — HYDROCODONE-ACETAMINOPHEN 5-325 MG PO TABS
1.0000 | ORAL_TABLET | ORAL | Status: DC | PRN
  Administered 2017-09-25 – 2017-09-26 (×4): 1 via ORAL
  Filled 2017-09-25 (×4): qty 1

## 2017-09-25 MED ORDER — CEFAZOLIN SODIUM-DEXTROSE 2-4 GM/100ML-% IV SOLN
2.0000 g | INTRAVENOUS | Status: AC
  Administered 2017-09-25: 2 g via INTRAVENOUS
  Filled 2017-09-25: qty 100

## 2017-09-25 MED ORDER — PROPOFOL 500 MG/50ML IV EMUL
INTRAVENOUS | Status: DC | PRN
  Administered 2017-09-25: 25 ug/kg/min via INTRAVENOUS

## 2017-09-25 MED ORDER — MIDAZOLAM HCL 2 MG/2ML IJ SOLN: 2.0000 mg | Freq: Once | INTRAMUSCULAR | Status: DC

## 2017-09-25 MED ORDER — ONDANSETRON HCL 4 MG/2ML IJ SOLN
INTRAMUSCULAR | Status: DC | PRN
  Administered 2017-09-25: 4 mg via INTRAVENOUS

## 2017-09-25 MED ORDER — ROCURONIUM BROMIDE 10 MG/ML (PF) SYRINGE
PREFILLED_SYRINGE | INTRAVENOUS | Status: AC
  Filled 2017-09-25: qty 10

## 2017-09-25 MED ORDER — 0.9 % SODIUM CHLORIDE (POUR BTL) OPTIME
TOPICAL | Status: DC | PRN
  Administered 2017-09-25 (×2): 1000 mL

## 2017-09-25 MED ORDER — ARTIFICIAL TEARS OPHTHALMIC OINT
TOPICAL_OINTMENT | OPHTHALMIC | Status: AC
  Filled 2017-09-25: qty 3.5

## 2017-09-25 MED ORDER — CELECOXIB 200 MG PO CAPS
200.0000 mg | ORAL_CAPSULE | ORAL | Status: AC
  Administered 2017-09-25: 200 mg via ORAL
  Filled 2017-09-25: qty 1

## 2017-09-25 MED ORDER — DEXAMETHASONE SODIUM PHOSPHATE 10 MG/ML IJ SOLN
INTRAMUSCULAR | Status: AC
  Filled 2017-09-25: qty 1

## 2017-09-25 MED ORDER — LACTATED RINGERS IV SOLN
INTRAVENOUS | Status: DC | PRN
  Administered 2017-09-25 (×2): via INTRAVENOUS

## 2017-09-25 MED ORDER — PHENYLEPHRINE 40 MCG/ML (10ML) SYRINGE FOR IV PUSH (FOR BLOOD PRESSURE SUPPORT)
PREFILLED_SYRINGE | INTRAVENOUS | Status: DC | PRN
  Administered 2017-09-25 (×2): 80 ug via INTRAVENOUS

## 2017-09-25 MED ORDER — FENTANYL CITRATE (PF) 100 MCG/2ML IJ SOLN
INTRAMUSCULAR | Status: DC | PRN
  Administered 2017-09-25 (×4): 50 ug via INTRAVENOUS

## 2017-09-25 MED ORDER — ONDANSETRON HCL 4 MG/2ML IJ SOLN
INTRAMUSCULAR | Status: AC
  Filled 2017-09-25: qty 2

## 2017-09-25 MED ORDER — LIDOCAINE 2% (20 MG/ML) 5 ML SYRINGE
INTRAMUSCULAR | Status: AC
  Filled 2017-09-25: qty 5

## 2017-09-25 SURGICAL SUPPLY — 57 items
APPLIER CLIP 9.375 MED OPEN (MISCELLANEOUS) ×9
BINDER BREAST XLRG (GAUZE/BANDAGES/DRESSINGS) ×3 IMPLANT
BLADE SURG 15 STRL LF DISP TIS (BLADE) ×2 IMPLANT
BLADE SURG 15 STRL SS (BLADE) ×1
CANISTER SUCT 3000ML PPV (MISCELLANEOUS) ×3 IMPLANT
CHLORAPREP W/TINT 26ML (MISCELLANEOUS) ×3 IMPLANT
CLIP APPLIE 9.375 MED OPEN (MISCELLANEOUS) ×6 IMPLANT
COVER PROBE W GEL 5X96 (DRAPES) ×3 IMPLANT
COVER SURGICAL LIGHT HANDLE (MISCELLANEOUS) ×3 IMPLANT
DERMABOND ADVANCED (GAUZE/BANDAGES/DRESSINGS) ×2
DERMABOND ADVANCED .7 DNX12 (GAUZE/BANDAGES/DRESSINGS) ×4 IMPLANT
DEVICE DISSECT PLASMABLAD 3.0S (MISCELLANEOUS) ×2 IMPLANT
DEVICE DUBIN SPECIMEN MAMMOGRA (MISCELLANEOUS) ×3 IMPLANT
DRAIN CHANNEL 19F RND (DRAIN) ×6 IMPLANT
DRAPE CHEST BREAST 15X10 FENES (DRAPES) ×3 IMPLANT
DRAPE UTILITY XL STRL (DRAPES) ×3 IMPLANT
DRAPE WARM FLUID 44X44 (DRAPE) ×3 IMPLANT
ELECT CAUTERY BLADE 6.4 (BLADE) ×3 IMPLANT
ELECT COATED BLADE 2.86 ST (ELECTRODE) ×3 IMPLANT
ELECT REM PT RETURN 9FT ADLT (ELECTROSURGICAL) ×3
ELECTRODE REM PT RTRN 9FT ADLT (ELECTROSURGICAL) ×2 IMPLANT
EVACUATOR SILICONE 100CC (DRAIN) ×6 IMPLANT
GAUZE SPONGE 4X4 12PLY STRL (GAUZE/BANDAGES/DRESSINGS) ×3 IMPLANT
GLOVE BIO SURGEON STRL SZ7.5 (GLOVE) IMPLANT
GLOVE SURG SS PI 7.0 STRL IVOR (GLOVE) ×6 IMPLANT
GLOVE SURG SS PI 7.5 STRL IVOR (GLOVE) ×9 IMPLANT
GOWN STRL REUS W/ TWL LRG LVL3 (GOWN DISPOSABLE) ×10 IMPLANT
GOWN STRL REUS W/TWL LRG LVL3 (GOWN DISPOSABLE) ×5
KIT BASIN OR (CUSTOM PROCEDURE TRAY) ×3 IMPLANT
KIT MARKER MARGIN INK (KITS) ×3 IMPLANT
KIT TURNOVER KIT B (KITS) ×3 IMPLANT
LIGHT WAVEGUIDE WIDE FLAT (MISCELLANEOUS) IMPLANT
NEEDLE HYPO 25GX1X1/2 BEV (NEEDLE) ×3 IMPLANT
NS IRRIG 1000ML POUR BTL (IV SOLUTION) ×3 IMPLANT
PACK GENERAL/GYN (CUSTOM PROCEDURE TRAY) ×3 IMPLANT
PACK SURGICAL SETUP 50X90 (CUSTOM PROCEDURE TRAY) ×3 IMPLANT
PAD ARMBOARD 7.5X6 YLW CONV (MISCELLANEOUS) ×6 IMPLANT
PENCIL BUTTON HOLSTER BLD 10FT (ELECTRODE) ×3 IMPLANT
PLASMABLADE 3.0S (MISCELLANEOUS) ×3
SPECIMEN JAR X LARGE (MISCELLANEOUS) ×3 IMPLANT
SPONGE LAP 18X18 X RAY DECT (DISPOSABLE) ×3 IMPLANT
STAPLER VISISTAT 35W (STAPLE) ×3 IMPLANT
SUT ETHILON 3 0 FSL (SUTURE) ×6 IMPLANT
SUT MNCRL AB 4-0 PS2 18 (SUTURE) ×6 IMPLANT
SUT MON AB 4-0 PC3 18 (SUTURE) ×3 IMPLANT
SUT SILK 2 0 SH (SUTURE) IMPLANT
SUT VIC AB 0 CT1 27 (SUTURE) ×1
SUT VIC AB 0 CT1 27XBRD ANBCTR (SUTURE) ×2 IMPLANT
SUT VIC AB 3-0 54X BRD REEL (SUTURE) ×2 IMPLANT
SUT VIC AB 3-0 BRD 54 (SUTURE) ×1
SUT VIC AB 3-0 SH 18 (SUTURE) ×3 IMPLANT
SYR BULB 3OZ (MISCELLANEOUS) ×3 IMPLANT
SYR CONTROL 10ML LL (SYRINGE) ×3 IMPLANT
TOWEL OR 17X24 6PK STRL BLUE (TOWEL DISPOSABLE) ×3 IMPLANT
TOWEL OR 17X26 10 PK STRL BLUE (TOWEL DISPOSABLE) ×3 IMPLANT
TUBE CONNECTING 12X1/4 (SUCTIONS) ×3 IMPLANT
YANKAUER SUCT BULB TIP NO VENT (SUCTIONS) ×3 IMPLANT

## 2017-09-25 NOTE — Anesthesia Postprocedure Evaluation (Signed)
Anesthesia Post Note  Patient: [name]  Procedure(s) Performed: LEFT MODIFIED RADICAL MASTECTOMY (Left Breast) RIGHT BREAST LUMPECTOMY WITH RADIOACTIVE SEED LOCALIZATION (Right Breast)     Patient location during evaluation: PACU Anesthesia Type: Regional and General Level of consciousness: awake and alert Pain management: pain level controlled Vital Signs Assessment: post-procedure vital signs reviewed and stable Respiratory status: spontaneous breathing, nonlabored ventilation and respiratory function stable Cardiovascular status: blood pressure returned to baseline and stable Postop Assessment: no apparent nausea or vomiting Anesthetic complications: no    Last Vitals:  Vitals:   09/25/17 1420 09/25/17 1435  BP: 119/71 123/63  Pulse: 69 67  Resp: 15 10  Temp:    SpO2: 96% 97%    Last Pain:  Vitals:   09/25/17 1435  TempSrc:   PainSc: 5                  Keller Bounds,W. EDMOND

## 2017-09-25 NOTE — Transfer of Care (Signed)
Immediate Anesthesia Transfer of Care Note  Patient: Brooke [name]  Procedure(s) Performed: LEFT MODIFIED RADICAL MASTECTOMY (Left Breast) RIGHT BREAST LUMPECTOMY WITH RADIOACTIVE SEED LOCALIZATION (Right Breast)  Patient Location: PACU  Anesthesia Type:General and Regional  Level of Consciousness: unresponsive and drowsy  Airway & Oxygen Therapy: Patient Spontanous Breathing and Patient connected to face mask oxygen  Post-op Assessment: Report given to RN and Post -op Vital signs reviewed and stable  Post vital signs: Reviewed and stable  Last Vitals:  Vitals Value Taken Time  BP 110/57 09/25/2017  2:05 PM  Temp    Pulse 67 09/25/2017  2:07 PM  Resp 15 09/25/2017  2:07 PM  SpO2 99 % 09/25/2017  2:07 PM  Vitals shown include unvalidated device data.  Last Pain:  Vitals:   09/25/17 0920  TempSrc:   PainSc: 0-No pain      Patients Stated Pain Goal: 3 (69/79/48 0165)  Complications: No apparent anesthesia complications

## 2017-09-25 NOTE — Op Note (Signed)
09/25/2017  1:55 PM  PATIENT:  Brooke [name]  67 y.o. female  PRE-OPERATIVE DIAGNOSIS:  LEFT BREAST CANCER, RIGHT BREAST COMPLEX SCLEROSING LESION  POST-OPERATIVE DIAGNOSIS:  LEFT BREAST CANCER, RIGHT BREAST COMPLEX   PROCEDURE:  Procedure(s): LEFT MODIFIED RADICAL MASTECTOMY (Left) RIGHT BREAST LUMPECTOMY WITH RADIOACTIVE SEED LOCALIZATION (Right)  SURGEON:  Surgeon(s) and Role:    Jovita Kussmaul, MD - Primary  PHYSICIAN ASSISTANT:   ASSISTANTS: Judyann Munson, RNFA   ANESTHESIA:   local and general  EBL:  20 mL   BLOOD ADMINISTERED:none  DRAINS: (1) Jackson-Pratt drain(s) with closed bulb suction in the left prepectoral space   LOCAL MEDICATIONS USED:  MARCAINE     SPECIMEN:  Source of Specimen:  right breast tissue, left mastectomy with axillary contents  DISPOSITION OF SPECIMEN:  PATHOLOGY  COUNTS:  YES  TOURNIQUET:  * No tourniquets in log *  DICTATION: .Dragon Dictation   After informed consent was obtained the patient was brought to the operating room and placed in the supine position on the operating table.  After adequate induction of general anesthesia the patient's bilateral chest, breast, and axillary areas were prepped with ChloraPrep, allowed to dry, and draped in usual sterile manner.  An appropriate timeout was performed.  Attention was first turned to the right breast.  Patient has a small area of complex sclerosing lesion in the outer aspect of the right breast.  Previously an I-125 seed was placed in the outer aspect of the right breast to mark this area.  The neoprobe was set to I-125 in the area of radioactivity was readily identified.  The area around this was infiltrated with quarter percent Marcaine.  A curvilinear incision was made along the outer edge of the breast with a 15 blade knife.  The incision was carried through the skin and subcutaneous tissue sharply with electrocautery.  The dissection was then carried towards the radioactive seed  under the direction of the neoprobe.  Once I more closely approached the radioactive seed I then removed a circular portion of breast tissue sharply with the electrocautery around the radioactive seed while checking the area of radioactivity frequently.  Once the specimen was removed it was oriented with the appropriate paint colors.  A specimen radiograph was obtained that showed the clip and seed to be near the center of the specimen.  The specimen was then sent to pathology for further evaluation.  The wound was irrigated with saline and infiltrated with more quarter percent Marcaine.  Hemostasis was achieved using the Bovie electrocautery.  The deep layer of the wound was then closed with layers of interrupted 3-0 Vicryl stitches.  The skin was then closed with a running 4-0 Monocryl subcuticular stitch.  Attention was then turned to the left breast.  The patient had a large cancer with positive nodes on the left side that did not respond very well to chemotherapy.  She is for a left modified radical mastectomy.  An elliptical incision was made around the nipple and areole a complex in order to minimize the excess skin.  The incision was carried through the skin and subcutaneous tissue sharply with the plasma blade.  Breast hooks were then used to elevate the skin flaps anteriorly towards the saline.  Thin skin flaps were then created circumferentially between the breast tissue in the subcutaneous fat.  This dissection was carried all the way to the chest wall.  Next the breast was removed from the pectoralis muscle with the pectoralis  fascia.  Laterally the dissection was carried along the chest wall and along the skin edge until the serratus muscle medially and the latissimus muscle laterally were identified.  Dissection was then carried superiorly with the plasma blade as well as bluntly with a right angle clamp until we were able to identify the left subclavian vein.  The contents of the left axilla within  these boundaries was then teased away by blunt right angle dissection.  Several vessels and lymphatics and intercostal brachial nerves were controlled with clips.  The long thoracic and thoracodorsal nerves were identified and spared.  Once this dissection was completed then the entire left axillary contents with the breast en bloc were removed and sent to pathology for further evaluation.  The left breast was marked with a stitch on the lateral skin.  Hemostasis was achieved using the plasma blade.  The wound was irrigated with copious amounts of saline.  The lateral axillary tissue was tacked to the chest wall with a running 3-0 Vicryl stitch.  A small stab incision was made near the anterior axillary line with the plasma blade inferior to the operative area.  A tonsil clamp was placed through this opening and used to bring a 19 [place] round Blake drain into the operative bed.  The drain was anchored to the skin with a 3-0 nylon stitch.  The superior and inferior skin flaps were then grossly reapproximated with interrupted 3-0 Vicryl stitches.  Skin was then closed with a running 4-0 Monocryl subcuticular stitch.  Dermabond dressings were applied as well as a drain dressing.  The drain was placed to bulb suction and there was a good seal.  The patient tolerated the procedure well.  At the end of the case all needle sponge and instrument counts were correct.  The patient was then awakened and taken to recovery in stable condition.  PLAN OF CARE: Admit for overnight observation  PATIENT DISPOSITION:  PACU - hemodynamically stable.   Delay start of Pharmacological VTE agent (>24hrs) due to surgical blood loss or risk of bleeding: no

## 2017-09-25 NOTE — H&P (Signed)
Brooke [name]  Location: [facility] Patient #: [id number] DOB: 1951-01-30 Single / Language: Brooke [name] / Race: White Female   History of Present Illness The patient is a 67 year old female who presents for a follow-up for Breast cancer. The patient was diagnosed several months ago with an invasive ductal cancer in the upper portion of the left breast that covered about 7-8 cm. She had 3 abnormal lymph nodes one of which was biopsied and was positive. The cancer was ER and PR positive and HER-2 negative with a Ki-67 of 20%. She has received neoadjuvant chemotherapy and on her most recent imaging studies she has had no shrinkage of the cancer or the lymph nodes. She also had an area of enhancement in the right breast that was biopsied and came back as a complex sclerosing lesion. Given its appearance and its size greater than a centimeter I would also recommend having this area removed at the same time. Her last dose of chemotherapy was on June 4. She is now ready to schedule her definitive surgery.   Allergies  Latex  Allergies Reconciled   Medication History Cephalexin (500MG Capsule, Oral) Active. Xiidra (5% Solution, Ophthalmic) Active. Medications Reconciled    Review of Systems  General Present- Appetite Loss, Chills and Fatigue. Not Present- Fever, Night Sweats, Weight Gain and Weight Loss. Skin Present- Dryness. Not Present- Change in Wart/Mole, Hives, Jaundice, New Lesions, Non-Healing Wounds, Rash and Ulcer. HEENT Present- Hearing Loss, Hoarseness and Wears glasses/contact lenses. Not Present- Earache, Nose Bleed, Oral Ulcers, Ringing in the Ears, Seasonal Allergies, Sinus Pain, Sore Throat, Visual Disturbances and Yellow Eyes. Respiratory Present- Snoring. Not Present- Bloody sputum, Chronic Cough, Difficulty Breathing and Wheezing. Breast Present- Breast Mass and Breast Pain. Not Present- Nipple Discharge and Skin Changes. Cardiovascular Present- Leg  Cramps. Not Present- Chest Pain, Difficulty Breathing Lying Down, Palpitations, Rapid Heart Rate, Shortness of Breath and Swelling of Extremities. Gastrointestinal Present- Bloating, Change in Bowel Habits, Constipation, Hemorrhoids and Rectal Pain. Not Present- Abdominal Pain, Bloody Stool, Chronic diarrhea, Difficulty Swallowing, Excessive gas, Gets full quickly at meals, Indigestion, Nausea and Vomiting. Female Genitourinary Not Present- Frequency, Nocturia, Painful Urination, Pelvic Pain and Urgency. Musculoskeletal Present- Back Pain, Joint Pain and Joint Stiffness. Not Present- Muscle Pain, Muscle Weakness and Swelling of Extremities. Neurological Not Present- Decreased Memory, Fainting, Headaches, Numbness, Seizures, Tingling, Tremor, Trouble walking and Weakness. Psychiatric Present- Anxiety and Fearful. Not Present- Bipolar, Change in Sleep Pattern, Depression and Frequent crying. Endocrine Present- Hair Changes and Heat Intolerance. Not Present- Cold Intolerance, Excessive Hunger, Hot flashes and New Diabetes. Hematology Not Present- Blood Thinners, Easy Bruising, Excessive bleeding, Gland problems, HIV and Persistent Infections.  Vitals  Weight: 234.25 lb Height: 69in Body Surface Area: 2.21 m Body Mass Index: 34.59 kg/m  Temp.: 98.33F(Oral)  Pulse: 85 (Regular)  BP: 142/82 (Sitting, Left Arm, Standard)       Physical Exam  General Mental Status-Alert. General Appearance-Consistent with stated age. Hydration-Well hydrated. Voice-Normal.  Head and Neck Head-normocephalic, atraumatic with no lesions or palpable masses. Trachea-midline. Thyroid Gland Characteristics - normal size and consistency.  Eye Eyeball - Bilateral-Extraocular movements intact. Sclera/Conjunctiva - Bilateral-No scleral icterus.  Chest and Lung Exam Chest and lung exam reveals -quiet, even and easy respiratory effort with no use of accessory muscles and on  auscultation, normal breath sounds, no adventitious sounds and normal vocal resonance. Inspection Chest Wall - Normal. Back - normal.  Breast Note: There is some vague palpable fullness in the upper inner  quadrant of the left breast that seems mobile with no overlying skin changes. There is no palpable mass in the right breast. There is palpably enlarged lymph nodes in the left axilla   Cardiovascular Cardiovascular examination reveals -normal heart sounds, regular rate and rhythm with no murmurs and normal pedal pulses bilaterally.  Abdomen Inspection Inspection of the abdomen reveals - No Hernias. Skin - Scar - no surgical scars. Palpation/Percussion Palpation and Percussion of the abdomen reveal - Soft, Non Tender, No Rebound tenderness, No Rigidity (guarding) and No hepatosplenomegaly. Auscultation Auscultation of the abdomen reveals - Bowel sounds normal.  Neurologic Neurologic evaluation reveals -alert and oriented x 3 with no impairment of recent or remote memory. Mental Status-Normal.  Musculoskeletal Normal Exam - Left-Upper Extremity Strength Normal and Lower Extremity Strength Normal. Normal Exam - Right-Upper Extremity Strength Normal and Lower Extremity Strength Normal.  Lymphatic Head & Neck  General Head & Neck Lymphatics: Bilateral - Description - Normal. Axillary  General Axillary Region: Bilateral - Description - Normal. Tenderness - Non Tender. Femoral & Inguinal  Generalized Femoral & Inguinal Lymphatics: Bilateral - Description - Normal. Tenderness - Non Tender.    Assessment & Plan  MALIGNANT NEOPLASM OF UPPER-INNER QUADRANT OF LEFT BREAST IN FEMALE, ESTROGEN RECEPTOR POSITIVE (C50.212) Impression: The patient has a known cancer in the upper portion of the left breast with at least 3 positive lymph nodes. She has had no shrinkage of these areas with neoadjuvant chemotherapy. At this point she is ready for definitive surgery. Given the  findings and the lack of response I would recommend a left modified radical mastectomy. She will also require a right breast radioactive seed localized lumpectomy for a complex sclerosing lesion. I have discussed with her in detail the risks and benefits of the operation as well as some of the technical aspects and she understands and wishes to proceed.

## 2017-09-25 NOTE — Anesthesia Procedure Notes (Signed)
Procedure Name: LMA Insertion Date/Time: 09/25/2017 11:45 AM Performed by: Orlie Dakin, CRNA Pre-anesthesia Checklist: Patient identified, Emergency Drugs available, Suction available, Patient being monitored and Timeout performed Patient Re-evaluated:Patient Re-evaluated prior to induction Oxygen Delivery Method: Circle system utilized Preoxygenation: Pre-oxygenation with 100% oxygen Induction Type: IV induction Ventilation: Mask ventilation without difficulty LMA: LMA inserted LMA Size: 4.0 Tube type: Oral Number of attempts: 1 Placement Confirmation: positive ETCO2 Tube secured with: Tape Dental Injury: Injury to lip  Comments: Scant amount bleeding upper lip, midline after LMA placement, pressure applied, ointment applied.

## 2017-09-25 NOTE — Interval H&P Note (Signed)
History and Physical Interval Note:  09/25/2017 11:15 AM  Brooke [name]  has presented today for surgery, with the diagnosis of LEFT BREAST CANCER, RIGHT BREAST COMPLEX SCLEROSING LESION  The various methods of treatment have been discussed with the patient and family. After consideration of risks, benefits and other options for treatment, the patient has consented to  Procedure(s): LEFT MODIFIED RADICAL MASTECTOMY (Left) RIGHT BREAST LUMPECTOMY WITH RADIOACTIVE SEED LOCALIZATION (Right) as a surgical intervention .  The patient's history has been reviewed, patient examined, no change in status, stable for surgery.  I have reviewed the patient's chart and labs.  Questions were answered to the patient's satisfaction.     TOTH III,PAUL S

## 2017-09-25 NOTE — Anesthesia Procedure Notes (Signed)
Anesthesia Regional Block: Pectoralis block   Pre-Anesthetic Checklist: ,, timeout performed, Correct Patient, Correct Site, Correct Laterality, Correct Procedure, Correct Position, site marked, Risks and benefits discussed, pre-op evaluation,  At surgeon's request and post-op pain management  Laterality: Left  Prep: Maximum Sterile Barrier Precautions used, chloraprep       Needles:  Injection technique: Single-shot  Needle Type: Echogenic Stimulator Needle     Needle Length: 9cm  Needle Gauge: 21     Additional Needles:   Procedures:,,,, ultrasound used (permanent image in chart),,,,  Narrative:  Start time: 09/25/2017 10:13 AM End time: 09/25/2017 10:23 AM Injection made incrementally with aspirations every 5 mL. Anesthesiologist: Roderic Palau, MD  Additional Notes: 2% Lidocaine skin wheel.

## 2017-09-25 NOTE — Anesthesia Preprocedure Evaluation (Addendum)
Anesthesia Evaluation  Patient identified by MRN, date of birth, ID band Patient awake    Reviewed: Allergy & Precautions, H&P , NPO status , Patient's Chart, lab work & pertinent test results  History of Anesthesia Complications (+) PONV  Airway Mallampati: III  TM Distance: >3 FB Neck ROM: Full    Dental no notable dental hx. (+) Teeth Intact, Dental Advisory Given   Pulmonary neg pulmonary ROS,    Pulmonary exam normal breath sounds clear to auscultation       Cardiovascular + Peripheral Vascular Disease  negative cardio ROS   Rhythm:Regular Rate:Normal     Neuro/Psych Anxiety negative neurological ROS     GI/Hepatic negative GI ROS, Neg liver ROS,   Endo/Other  negative endocrine ROS  Renal/GU negative Renal ROS  negative genitourinary   Musculoskeletal  (+) Arthritis , Osteoarthritis,    Abdominal   Peds  Hematology  (+) anemia ,   Anesthesia Other Findings   Reproductive/Obstetrics negative OB ROS                            Anesthesia Physical Anesthesia Plan  ASA: II  Anesthesia Plan: General   Post-op Pain Management:  Regional for Post-op pain   Induction:   PONV Risk Score and Plan: 4 or greater and Ondansetron, Dexamethasone, Propofol infusion, Midazolam and Scopolamine patch - Pre-op  Airway Management Planned: LMA  Additional Equipment:   Intra-op Plan:   Post-operative Plan: Extubation in OR  Informed Consent: I have reviewed the patients History and Physical, chart, labs and discussed the procedure including the risks, benefits and alternatives for the proposed anesthesia with the patient or authorized representative who has indicated his/her understanding and acceptance.   Dental advisory given  Plan Discussed with: CRNA  Anesthesia Plan Comments:         Anesthesia Quick Evaluation

## 2017-09-25 NOTE — Progress Notes (Signed)
Received patient from PACU. Patient alert and oriented x4. Dressings clean dry and intact. Patient educated on call bell and the need to call for assistance when she needs to get up. Will continue to monitor.

## 2017-09-26 ENCOUNTER — Encounter (HOSPITAL_COMMUNITY): Payer: Self-pay | Admitting: General Surgery

## 2017-09-26 DIAGNOSIS — C50212 Malignant neoplasm of upper-inner quadrant of left female breast: Secondary | ICD-10-CM | POA: Diagnosis not present

## 2017-09-26 MED ORDER — HYDROCODONE-ACETAMINOPHEN 5-325 MG PO TABS: 1.0000 | ORAL_TABLET | Freq: Four times a day (QID) | ORAL | 0 refills | Status: DC | PRN

## 2017-09-26 MED ORDER — METHOCARBAMOL 500 MG PO TABS: 500.0000 mg | ORAL_TABLET | Freq: Four times a day (QID) | ORAL | 0 refills | Status: DC | PRN

## 2017-09-26 NOTE — Discharge Planning (Signed)
Patient discharged home in stable condition. Verbalizes understanding of all discharge instructions, including home medications and follow up appointments. 

## 2017-09-26 NOTE — Progress Notes (Signed)
1 Day Post-Op   Subjective/Chief Complaint: No complaints   Objective: Vital signs in last 24 hours: Temp:  [97.3 F (36.3 C)-98.3 F (36.8 C)] 98 F (36.7 C) (07/30 1007) Pulse Rate:  [54-69] 66 (07/30 1007) Resp:  [10-16] 16 (07/30 1007) BP: (91-135)/(52-73) 125/57 (07/30 1007) SpO2:  [95 %-100 %] 97 % (07/30 1007) Weight:  [106.5 kg (234 lb 12.6 oz)] 106.5 kg (234 lb 12.6 oz) (07/29 1522) Last BM Date: 09/25/17  Intake/Output from previous day: 07/29 0701 - 07/30 0700 In: 1580.1 [I.V.:1430.1; IV Piggyback:50] Out: 134 [Drains:114; Blood:20] Intake/Output this shift: No intake/output data recorded.  General appearance: alert and cooperative Resp: clear to auscultation bilaterally Chest wall: skin flaps look good Cardio: regular rate and rhythm GI: soft, non-tender; bowel sounds normal; no masses,  no organomegaly  Lab Results:  No results for input(s): WBC, HGB, HCT, PLT in the last 72 hours. BMET No results for input(s): NA, K, CL, CO2, GLUCOSE, BUN, CREATININE, CALCIUM in the last 72 hours. PT/INR No results for input(s): LABPROT, INR in the last 72 hours. ABG No results for input(s): PHART, HCO3 in the last 72 hours.  Invalid input(s): PCO2, PO2  Studies/Results: No results found.  Anti-infectives: Anti-infectives (From admission, onward)   Start     Dose/Rate Route Frequency Ordered Stop   09/25/17 0900  ceFAZolin (ANCEF) IVPB 2g/100 mL premix     2 g 200 mL/hr over 30 Minutes Intravenous On call to O.R. 09/25/17 0854 09/25/17 1203      Assessment/Plan: s/p Procedure(s): LEFT MODIFIED RADICAL MASTECTOMY (Left) RIGHT BREAST LUMPECTOMY WITH RADIOACTIVE SEED LOCALIZATION (Right) Advance diet Discharge  LOS: 0 days    TOTH III,Chihiro Frey S 09/26/2017

## 2017-10-05 ENCOUNTER — Ambulatory Visit: Payer: Medicare Other | Admitting: Family Medicine

## 2017-10-05 ENCOUNTER — Encounter: Payer: Self-pay | Admitting: Family Medicine

## 2017-10-05 VITALS — BP 138/80 | HR 75 | Temp 98.4°F | Ht 69.0 in | Wt 227.4 lb

## 2017-10-05 DIAGNOSIS — C50919 Malignant neoplasm of unspecified site of unspecified female breast: Secondary | ICD-10-CM | POA: Diagnosis not present

## 2017-10-05 DIAGNOSIS — R300 Dysuria: Secondary | ICD-10-CM | POA: Diagnosis not present

## 2017-10-05 DIAGNOSIS — Z1211 Encounter for screening for malignant neoplasm of colon: Secondary | ICD-10-CM | POA: Diagnosis not present

## 2017-10-05 LAB — POCT URINALYSIS DIPSTICK
BILIRUBIN UA: NEGATIVE
Blood, UA: NEGATIVE
GLUCOSE UA: NEGATIVE
Ketones, UA: NEGATIVE
Nitrite, UA: NEGATIVE
Protein, UA: NEGATIVE
Spec Grav, UA: >=1.03 — AB (ref 1.010–1.025)
Urobilinogen, UA: 0.2 E.U./dL
pH, UA: 5 (ref 5.0–8.0)

## 2017-10-05 MED ORDER — NITROFURANTOIN MONOHYD MACRO 100 MG PO CAPS: 100.0000 mg | ORAL_CAPSULE | Freq: Two times a day (BID) | ORAL | 0 refills | Status: DC

## 2017-10-05 NOTE — Progress Notes (Signed)
HPI:  Using dictation device. Unfortunately this device frequently misinterprets words/phrases.  Brooke [name] is a very pleasant 67 year old here for an acute visit for dysuria: -Started 4 to 5 days ago -Symptoms include mild burning with urination, urine seems to be a little cloudy and has a slight odor -She has had a UTI in the past, and reports this feels same -Denies fevers, malaise, pelvic pain, nausea, vomiting, hematuria or vaginal symptoms -She recently underwent radical mastectomy/breast surgery for breast cancer, denies any recent antibiotic use  ROS: See pertinent positives and negatives per HPI.  Past Medical History:  Diagnosis Date  . Anemia   . Anxiety   . Arthritis   . Bilateral dry eyes   . Breast cancer, left breast ([place])   . Family history of breast cancer 02/02/2017  . Family history of colon cancer 02/02/2017  . Family history of prostate cancer in father 02/02/2017  . Fibroid   . History of kidney stones   . Hyperlipidemia   . Kidney stones   . Melanoma of upper arm ([place]) 2009   RIGHT ARM   . Obesity (BMI 30.0-34.9)   . Osteopenia 05/2015   T score -1.3 FRAX 6%/0.5% stable from prior DEXA  . PONV (postoperative nausea and vomiting)   . Squamous carcinoma     of the skin  . UTI (urinary tract infection)   . Varicose veins of both lower extremities   . Vitamin D deficiency 07/2009   LOW VITAMIN D 29  . Vitamin deficiency 07/2008   VITAMIN D LOW 23    Past Surgical History:  Procedure Laterality Date  . ABDOMINAL SURGERY  1975   ovairan cyst   . APPENDECTOMY    . BREAST LUMPECTOMY WITH RADIOACTIVE SEED LOCALIZATION Right 09/25/2017   Procedure: RIGHT BREAST LUMPECTOMY WITH RADIOACTIVE SEED LOCALIZATION;  Surgeon: Jovita Kussmaul, MD;  Location: [facility];  Service: General;  Laterality: Right;  . BREAST SURGERY     bio  . CHOLECYSTECTOMY    . DERMOID CYST  EXCISION    . DILATION AND CURETTAGE OF UTERUS  1998  . GALLBLADDER SURGERY    . HYSTEROSCOPY     . insertion  port a cath  03/02/2016  . MASTECTOMY MODIFIED RADICAL Left 09/25/2017   Procedure: LEFT MODIFIED RADICAL MASTECTOMY;  Surgeon: Jovita Kussmaul, MD;  Location: [facility];  Service: General;  Laterality: Left;  . PORTACATH PLACEMENT N/A 03/02/2017   Procedure: INSERTION PORT-A-CATH;  Surgeon: Jovita Kussmaul, MD;  Location: [facility];  Service: General;  Laterality: N/A;  . SKIN BIOPSY    . SKIN CANCER EXCISION  2010,2011   2010-RIGHT ARM, 2011-LEFT LOWER LEG.-DR Rolm Bookbinder DERM. DR. Sarajane Jews IS HER DERM SURGEON.    Family History  Problem Relation Age of Onset  . Diabetes Father   . Hypertension Father   . Heart disease Father   . Prostate cancer Father 18  . Breast cancer Sister 34       again at age 40, GT results unk  . Skin cancer Sister   . Colon cancer Paternal Grandfather 30  . Hypertension Mother   . Heart failure Mother     SOCIAL HX: See HPI   Current Outpatient Medications:  .  acetaminophen (TYLENOL) 500 MG tablet, Take 500 mg by mouth every 6 (six) hours as needed for moderate pain or headache., Disp: , Rfl:  .  alclomethasone (ACLOVATE) 0.05 % cream, Apply to affected area twice daily as needed  for dermatitis, Disp: , Rfl: 0 .  b complex vitamins tablet, Take 1 tablet by mouth daily., Disp: , Rfl:  .  HYDROcodone-acetaminophen (NORCO/VICODIN) 5-325 MG tablet, Take 1-2 tablets by mouth every 6 (six) hours as needed for moderate pain., Disp: 15 tablet, Rfl: 0 .  ibuprofen (ADVIL,MOTRIN) 200 MG tablet, Take 200 mg by mouth every 6 (six) hours as needed for headache or moderate pain., Disp: , Rfl:  .  Lifitegrast (XIIDRA) 5 % SOLN, Place 1 drop into both eyes daily as needed (dry eyes)., Disp: , Rfl:  .  methocarbamol (ROBAXIN) 500 MG tablet, Take 1 tablet (500 mg total) by mouth every 6 (six) hours as needed for muscle spasms., Disp: 15 tablet, Rfl: 0 .  Propylene Glycol (SYSTANE COMPLETE) 0.6 % SOLN, Place 1 drop into both eyes daily., Disp: , Rfl:  .  vitamin C  (ASCORBIC ACID) 500 MG tablet, Take 500 mg by mouth daily., Disp: , Rfl:  .  VITAMIN D, CHOLECALCIFEROL, PO, Take 6 drops by mouth every other day. 1 drop = 1000 international units, Disp: , Rfl:  .  nitrofurantoin, macrocrystal-monohydrate, (MACROBID) 100 MG capsule, Take 1 capsule (100 mg total) by mouth 2 (two) times daily., Disp: 14 capsule, Rfl: 0  EXAM:  Vitals:   10/05/17 1651  BP: 138/80  Pulse: 75  Temp: 98.4 F (36.9 C)    Body mass index is 33.58 kg/m.  GENERAL: vitals reviewed and listed above, alert, oriented, appears well hydrated and in no acute distress  HEENT: atraumatic, conjunttiva clear, no obvious abnormalities on inspection of external nose and ears  NECK: no obvious masses on inspection  LUNGS: clear to auscultation bilaterally, no wheezes, rales or rhonchi, good air movement  ABD: soft, mild TTP over bladder, no cva ttp  CV: HRRR, no peripheral edema  MS: moves all extremities without noticeable abnormality  PSYCH: pleasant and cooperative, no obvious depression or anxiety  ASSESSMENT AND PLAN:  Discussed the following assessment and plan:  Dysuria - Plan: POC Urinalysis Dipstick, Culture, Urine  -Her yeast infection versus UTI - leuks on dip, culture pending -She plans to trial the yeast cream, but to take the antibiotic if it is worsening or not improving over the next few days all the culture is pending -Patient advised to return or notify a doctor immediately if symptoms worsen or persist or new concerns arise. -She knows she is due for her colon cancer screening, but prefers to hold off for now given what she has going on with the breast cancer, agrees to schedule physical when she is ready gust further  Patient Instructions  BEFORE YOU LEAVE: -urine culture -follow up: for physical when convenient for patient  Try the yeast cream (monistat or other) just a small amount where irritated.   Start the antibiotic (macrobid) if worsening or  not improving.  We have ordered a urine culture. It can take up to 1-1.5 weeks for results and processing. IF results require follow up or explanation, we will call you with instructions. Clinically stable results will be released to your [facility]. If you have not heard from [place] or cannot find your results in [facility] in 1.5 weeks please contact our office at [phone].  If you are not yet signed up for [facility], please consider signing up.           [name], DO

## 2017-10-05 NOTE — Patient Instructions (Signed)
BEFORE YOU LEAVE: -urine culture -follow up: for physical when convenient for patient  Try the yeast cream (monistat or other) just a small amount where irritated.   Start the antibiotic (macrobid) if worsening or not improving.  We have ordered a urine culture. It can take up to 1-1.5 weeks for results and processing. IF results require follow up or explanation, we will call you with instructions. Clinically stable results will be released to your [facility]. If you have not heard from [place] or cannot find your results in [facility] in 1.5 weeks please contact our office at [phone].  If you are not yet signed up for [facility], please consider signing up.

## 2017-10-06 LAB — URINE CULTURE
MICRO NUMBER:: 90940390
SPECIMEN QUALITY: ADEQUATE

## 2017-10-09 ENCOUNTER — Inpatient Hospital Stay: Payer: Medicare Other | Admitting: Adult Health

## 2017-10-09 NOTE — Progress Notes (Signed)
Location of Breast Cancer: Invasive ductal carcinoma of the left breast.   Histology per Pathology Report:  DIAGNOSIS Diagnosis 01-26-17 1. Breast, right, needle core biopsy - COMPLEX SCLEROSING LESION WITH CALCIFICATIONS AND USUAL DUCTAL HYPERPLASIA. - FIBROCYSTIC CHANGES WITH CALCIFICATIONS AND USUAL DUCTAL HYPERPLASIA. 2. Lymph node, needle/core biopsy, left - FINDINGS CONSISTENT WITH METASTATIC MAMMARY CARCINOMA. 3 of 5 Amended copy Addendum FINAL for NAYLENE, FOELL L [phone]) Diagnosis(continued) - SEE MICROSCOPIC DESCRIPTION. 3. Breast, left, needle core biopsy, (A) 11:00 o'clock - INVASIVE MAMMARY CARCINOMA. - SEE MICROSCOPIC DESCRIPTION. 4. Breast, left, needle core biopsy, (B) 1:00 o'clock - INVASIVE MAMMARY CARCINOMA. - SEE MICROSCOPIC DESCRIPTION. Microscopic Comment 2. The left axillary biopsy consists entirely of mammary carcinoma which may represent a completely replaced node. There is no residual lymph node tissue within the specimen. 3. & 4. The differential includes grade II invasive lobular carcinoma. E-cadherin and breast prognostic profile will be performed. Called to [employer] on 01/27/17. (JDP:ah 11/30/  2. PROGNOSTIC INDICATORS 2. PROGNOSTIC INDICATORS Results: IMMUNOHISTOCHEMICAL AND MORPHOMETRIC ANALYSIS PERFORMED MANUALLY Estrogen Receptor: 100%, POSITIVE, STRONG STAINING INTENSITY Progesterone Receptor: 100%, POSITIVE, STRONG STAINING INTENSITY Proliferation Marker Ki67: 20% REFERENCE RANGE ESTROGEN RECEPTOR  2. FLUORESCENCE IN-SITU HYBRIDIZATION Results: HER2 - NEGATIVE RATIO OF HER2/CEP17 SIGNALS 1.31 AVERAGE HER2 COPY NUMBER PER CELL 2.30  3. PROGNOSTIC INDICATORS Results: IMMUNOHISTOCHEMICAL AND MORPHOMETRIC ANALYSIS PERFORMED MANUALLY Estrogen Receptor: 100%, POSITIVE, STRONG STAINING INTENSITY Progesterone Receptor: 100%, POSITIVE, STRONG STAINING INTENSITY Proliferation Marker Ki67: 15% REFERENCE RANGE ESTROGEN  RECEPTOR NEGATIVE 0% POSITIVE =>1% REFERENCE RANGE PROGESTERONE RECEPTOR NEGATIVE 0% POSITIVE =>1% All controls stained appropriately  3. FLUORESCENCE IN-SITU HYBRIDIZATION Results: HER2 - NEGATIVE RATIO OF HER2/CEP17 SIGNALS 1.58 AVERAGE HER2 COPY NUMBER PER CELL 2.05 Reference Range: NEGATIVE HER2/CEP17 Ratio <2.0 and average HER2 copy number <4.0 EQUIVOCAL HER2/CEP17 Ratio <2.0 and average HER2 copy number >=4.0 and <6.0 POSITIVE HER2/CEP17 Ratio >=2.0 or <2.0 and average HER2 copy numbe  4. PROGNOSTIC INDICATORS Results: IMMUNOHISTOCHEMICAL AND MORPHOMETRIC ANALYSIS PERFORMED MANUALLY Estrogen Receptor: 100%, POSITIVE, STRONG STAINING INTENSITY Progesterone Receptor: 100%, POSITIVE, STRONG STAINING INTENSITY Proliferation Marker Ki67: 20% REFERENCE RANGE ESTROGEN RECEPTOR NEGATIVE 0% POSITIVE =>1% REFERENCE RANGE PROGESTERONE RECEPTOR NEGATIVE 0% POSITIVE =>1% All controls stained appropriately  4. FLUORESCENCE IN-SITU HYBRIDIZATION Results: HER2 - NEGATIVE RATIO OF HER2/CEP17 SIGNALS 1.32 AVERAGE HER2 COPY NUMBER PER CELL 2.05 Reference Range: NEGATIVE HER2/CEP17 Ratio <2.0 and average HER2 copy number <4.0 EQUIVOCAL HER2/CEP17 Ratio <2.0 and average HER2 copy number >=4.0 and <6.0 POSITIVE HER2/CEP17 Ratio >=2.0 or <2.0 and average HER2 copy number  Did patient present with symptoms (if so, please note symptoms) or was this found on screening mammography?:She was noted to have a mass in the left brast for about 1 week. She underwent diagnostic imaging.  Past/Anticipated interventions by surgeon, if any: FINAL DIAGNOSIS Diagnosis 09-25-17 Dr. Autumn Messing 1. Breast, lumpectomy, Right w/seed - COMPLEX SCLEROSING LESION WITH CALCIFICATIONS. - PSEUDOANGIOMATOUS STROMAL HYPERPLASIA ([place]). - BIOPSY SITE. - NO MALIGNANCY IDENTIFIED. 2. Breast, modified radical mastectomy , Left - INVASIVE DUCTAL CARCINOMA, GRADE 2, SPANNING 6.2 CM. - INVASIVE CARCINOMA IS 0.1 CM TO  THE ANTERIOR MARGIN FOCALLY AND 0.4 CM OF THE POSTERIOR MARGIN FOCALLY. - EXTENSIVE LYMPHOVASCULAR INVASION. - BIOPSY SITE. - METASTATIC CARCINOMA IN NINE OF THIRTEEN LYMPH NODES (9/13) WITH FOCAL EXTRACAPSULAR EXTENSION. - SEE ONCOLOGY TABLE. Microscopic Comment 2. BREAST, STATUS POST NEOADJUVANT TREATMENT Procedure: Left modified mastectomy. Laterality: Left.  Receptor Status: ER(90 % +), PR (100 % +), Her2-neu (-), Ki-(20 % )  Past/Anticipated interventions by medical oncology, if any:Dr. Magrinat   Chemotherapy yes started neoadjuvant cyclophosphamide/docetaxel 03/03/2017, discontinued after 1 cycle with very poor tolerance             (a) started cyclophosphamide/methotrexate/fluorouracil (CMF) 03/28/2017, to be repeated for 7 cycles 21 days apart  Adjuvant radiation to follow surgery to start antiestrogens at the completion of local treatment  Lymphedema issues, if any: No ROM to left arm has been asked to restrict her left arm movement.  Will have a PT evaluation. Skin to left breast healing well incision clean and dry with fainting bruising to the incision line surgical glue intact. Follow up appointment to see Dr. Autumn Messing 10-23-17.   Pain issues, if any: No  SAFETY ISSUES:No  Prior radiation? :No  Pacemaker/ICD? :No  Possible current pregnancy?:No Is the patient on methotrexate?:No  Patient's last menstrual period was 03/18/1997. Menarche age 75. The patient is GX P0.  She stopped having periods in 1999 and took hormone replacement approximately 5 years.  Current Complaints / other details:  Metal clips on her bile duct left after having her gallbladder removed early 2000's   Wt Readings from Last 3 Encounters:  10/19/17 233 lb 3.2 oz (105.8 kg)  10/17/17 232 lb (105.2 kg)  10/05/17 227 lb 6.4 oz (103.1 kg)  BP (!) 144/73 (BP Location: Right Arm, Patient Position: Sitting)   Pulse 73   Temp 98.1 F (36.7 C) (Oral)   Resp 18   Ht 5' 9" (1.753 m)   Wt  233 lb 3.2 oz (105.8 kg)   LMP 03/18/1997   SpO2 95%   BMI 34.44 kg/m   Georgena Spurling, RN 10/09/2017,3:55 PM

## 2017-10-10 ENCOUNTER — Ambulatory Visit: Payer: Medicare Other | Attending: Radiation Oncology

## 2017-10-10 ENCOUNTER — Ambulatory Visit: Payer: Medicare Other | Admitting: Radiation Oncology

## 2017-10-10 ENCOUNTER — Institutional Professional Consult (permissible substitution): Payer: Medicare Other | Admitting: Radiation Oncology

## 2017-10-12 ENCOUNTER — Telehealth: Payer: Self-pay | Admitting: Oncology

## 2017-10-13 ENCOUNTER — Telehealth: Payer: Self-pay | Admitting: Oncology

## 2017-10-13 ENCOUNTER — Telehealth: Payer: Self-pay | Admitting: *Deleted

## 2017-10-13 NOTE — Telephone Encounter (Signed)
Per 8/16 schedule message cx 8/20 f/u with LC and r/s to be with GM. Next available with GM not until November. Patient made aware and per patient she had concerns she would like to speak with GM about as she has not seen him in a while. Patient informed I could send message to GM to see what he can accommodate or she can [place] with [place] and GM can be brought to the visit. Per patient she will [place] with [place] and would like to speak with GM at some point.  Spoke with desk nurse and she will make a note so that patient is able to speak with GM.

## 2017-10-13 NOTE — Telephone Encounter (Signed)
TC from patient. Patient wanted to advise that she would not be coming to her appt with Thedore Mins on Tuesday as she prefers to have an appt with Dr. [name]. She called yesterday and left a message with scheduling, but she just wanted to confirm her message from yesterday. Advised pt that I would make a notation in the chart and send scheduler a note as well.  No other questions or concerns.

## 2017-10-17 ENCOUNTER — Telehealth: Payer: Self-pay | Admitting: Pharmacist

## 2017-10-17 ENCOUNTER — Other Ambulatory Visit: Payer: Self-pay | Admitting: Oncology

## 2017-10-17 ENCOUNTER — Telehealth: Payer: Self-pay | Admitting: *Deleted

## 2017-10-17 ENCOUNTER — Inpatient Hospital Stay: Payer: Medicare Other | Attending: Oncology | Admitting: Adult Health

## 2017-10-17 ENCOUNTER — Encounter: Payer: Self-pay | Admitting: Adult Health

## 2017-10-17 ENCOUNTER — Telehealth: Payer: Self-pay | Admitting: Oncology

## 2017-10-17 VITALS — BP 151/71 | HR 73 | Temp 98.9°F | Resp 18 | Ht 69.0 in | Wt 232.0 lb

## 2017-10-17 DIAGNOSIS — Z79899 Other long term (current) drug therapy: Secondary | ICD-10-CM | POA: Diagnosis not present

## 2017-10-17 DIAGNOSIS — C50212 Malignant neoplasm of upper-inner quadrant of left female breast: Secondary | ICD-10-CM | POA: Diagnosis present

## 2017-10-17 DIAGNOSIS — Z8042 Family history of malignant neoplasm of prostate: Secondary | ICD-10-CM | POA: Diagnosis not present

## 2017-10-17 DIAGNOSIS — N6489 Other specified disorders of breast: Secondary | ICD-10-CM

## 2017-10-17 DIAGNOSIS — C50812 Malignant neoplasm of overlapping sites of left female breast: Secondary | ICD-10-CM

## 2017-10-17 DIAGNOSIS — Z803 Family history of malignant neoplasm of breast: Secondary | ICD-10-CM

## 2017-10-17 DIAGNOSIS — Z9889 Other specified postprocedural states: Secondary | ICD-10-CM | POA: Diagnosis not present

## 2017-10-17 DIAGNOSIS — Z170 Estrogen receptor positive status [ER+]: Secondary | ICD-10-CM

## 2017-10-17 DIAGNOSIS — Z9012 Acquired absence of left breast and nipple: Secondary | ICD-10-CM | POA: Diagnosis not present

## 2017-10-17 DIAGNOSIS — M8580 Other specified disorders of bone density and structure, unspecified site: Secondary | ICD-10-CM | POA: Diagnosis not present

## 2017-10-17 DIAGNOSIS — Z85820 Personal history of malignant melanoma of skin: Secondary | ICD-10-CM

## 2017-10-17 DIAGNOSIS — E785 Hyperlipidemia, unspecified: Secondary | ICD-10-CM | POA: Diagnosis not present

## 2017-10-17 MED ORDER — CAPECITABINE 500 MG PO TABS: ORAL_TABLET | ORAL | 1 refills | Status: DC

## 2017-10-17 NOTE — Progress Notes (Addendum)
[place]  [phone] [phone]     ID: Brooke [name] DOB: 1950/04/24  MR#: [id number]  EHO#:[id number]  Patient Care Team: [name], DO as PCP - General (Family Medicine) Phineas Real, Belinda Block, MD (Obstetrics and Gynecology) Rolm Bookbinder, MD as Attending Physician (Dermatology) Magrinat, Virgie Dad, MD as Consulting Physician (Oncology) Kyung Rudd, MD as Consulting Physician (Radiation Oncology) Jovita Kussmaul, MD as Consulting Physician (General Surgery) Irene Limbo, MD as Consulting Physician (Plastic Surgery) Delice Bison, Charlestine Massed, NP as Nurse Practitioner (Hematology and Oncology) OTHER MD:  CHIEF COMPLAINT: Estrogen receptor positive breast cancer  CURRENT TREATMENT: post surgery, preparing for Radiation/Capecitabine  HISTORY OF CURRENT ILLNESS: From the original intake note:  Brooke [name] herself palpated a change in her left breast and brought this to medical attention.  On 01/18/2017 she underwent bilateral diagnostic mammography with tomography and bilateral breast ultrasonography at [facility].  The breast density was category B.  At the most recent exam was from December 2016.  In the right breast upper outer quadrant there was a 1 cm irregular mass associated with punctate calcification.  By ultrasound this was not well-defined, and it was biopsied on 01/26/2017 with tomographic guidance this showed a complex sclerosing lesion with the usual ductal hyperplasia.  The right axilla was sonographically  On the left mammography showed a 4 cm dense mass with indistinct margins in the upper inner quadrant.  Ultrasound defined a 3 cm irregular mass in the upper inner quadrant of the left breast which was palpable.  There was an abnormal lymph node in the left axilla.  Biopsy of the left mass and abnormal lymph node ([id number], on 01/26/2017) showed both to be involved by invasive ductal carcinoma, with some lobular features but E-cadherin  positive, estrogen and progesterone receptors both 100% positive, with strong staining intensity, with MIB-1 between 15-20%.  There was no HER-2 amplification, the signals ratio being 1.31/1.58 and the number per cell 2.30/2.05  The patient's subsequent history is as detailed below.  Brooke [name] returns today for follow-up and treatment of her estrogen receptor positive breast cancer.  Since her last visit [name] underwent surgery with Dr. Marlou Starks on 09/25/2017.  She had a left breast mastectomy and lymph node dissection, along with a right breast lumpectomy.    The mastectomy demonstrated residual invasive ductal carcinoma, 6.2 cm, with 9 of 13 lymph nodes positive for metastatic disease.  Her receptors were again ER/PR positive, and HER-2 negative.  Her right breast lumpectomy was compatible with a complex sclerosing lesion.     REVIEW OF SYSTEMS: Brooke [name] is doing well today.  She continues to recover from surgery.  She is no longer taking any prescribed pain medications.  Her drain was removed last Friday.  She has met with plastic surgery and was told that she would not be a good candidate for reconstruction.  She has decided against reconstruction at this point.    Brooke [name] denies any fevers, chills, night sweats, palpitations, chest pain, shortness of breath or cough.  She is without any headaches vision changes, dysphagia, or any nausea vomiting.  She hasn't been constipated or having diarrhea.  A detailed ROS was conducted and otherwise non contributory today.      PAST MEDICAL HISTORY: Past Medical History:  Diagnosis Date  . Anemia   . Anxiety   . Arthritis   . Bilateral dry eyes   . Breast cancer, left breast ([place])   . Family history of breast cancer  02/02/2017  . Family history of colon cancer 02/02/2017  . Family history of prostate cancer in father 02/02/2017  . Fibroid   . History of [name] stones   . Hyperlipidemia   . [name] stones   . Melanoma of upper arm ([place]) 2009    RIGHT ARM   . Obesity (BMI 30.0-34.9)   . Osteopenia 05/2015   T score -1.3 FRAX 6%/0.5% stable from prior DEXA  . PONV (postoperative nausea and vomiting)   . Squamous carcinoma     of the skin  . UTI (urinary tract infection)   . Varicose veins of both lower extremities   . Vitamin D deficiency 07/2009   LOW VITAMIN D 29  . Vitamin deficiency 07/2008   VITAMIN D LOW 23    PAST SURGICAL HISTORY: Past Surgical History:  Procedure Laterality Date  . ABDOMINAL SURGERY  1975   ovairan cyst   . APPENDECTOMY    . BREAST LUMPECTOMY WITH RADIOACTIVE SEED LOCALIZATION Right 09/25/2017   Procedure: RIGHT BREAST LUMPECTOMY WITH RADIOACTIVE SEED LOCALIZATION;  Surgeon: Jovita Kussmaul, MD;  Location: [place];  Service: General;  Laterality: Right;  . BREAST SURGERY     bio  . CHOLECYSTECTOMY    . DERMOID CYST  EXCISION    . DILATION AND CURETTAGE OF UTERUS  1998  . GALLBLADDER SURGERY    . HYSTEROSCOPY    . insertion  port a cath  03/02/2016  . MASTECTOMY MODIFIED RADICAL Left 09/25/2017   Procedure: LEFT MODIFIED RADICAL MASTECTOMY;  Surgeon: Jovita Kussmaul, MD;  Location: [place];  Service: General;  Laterality: Left;  . PORTACATH PLACEMENT N/A 03/02/2017   Procedure: INSERTION PORT-A-CATH;  Surgeon: Jovita Kussmaul, MD;  Location: [place];  Service: General;  Laterality: N/A;  . SKIN BIOPSY    . SKIN CANCER EXCISION  2010,2011   2010-RIGHT ARM, 2011-LEFT LOWER LEG.-DR Rolm Bookbinder DERM. DR. Sarajane Jews IS HER DERM SURGEON.    FAMILY HISTORY Family History  Problem Relation Age of Onset  . Diabetes Father   . Hypertension Father   . Heart disease Father   . Prostate cancer Father 38  . Breast cancer Sister 107       again at age 77, GT results unk  . Skin cancer Sister   . Colon cancer Paternal Grandfather 69  . Hypertension Mother   . Heart failure Mother   The patient's father was diagnosed with prostate cancer in his 71s and died at age 56 from complications of that disease.  The patient's  mother died at age 10 with congestive heart failure.  The patient had no brothers, 3 sisters.  One sister had breast cancer at age 17 and again at age 33.  She has been genetically tested and was found to be BRCA not mutated in addition a paternal grandfather had colon cancer in his 27s.  There is no history of ovarian cancer in the family to the patient's knowledge.  GYNECOLOGIC HISTORY:  Patient's last menstrual period was 03/18/1997. Menarche age 25. The patient is GX P0.  She stopped having periods in 1999 and took hormone replacement approximately 5 years.  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 She is single and lives alone with no pets.    ADVANCED DIRECTIVES:    HEALTH MAINTENANCE: Social History   Tobacco Use  . Smoking status: Never Smoker  . Smokeless tobacco:  Never Used  Substance Use Topics  . Alcohol use: No    Frequency: Never    Comment: rare  . Drug use: No     Colonoscopy: Never  PAP:  Bone density: 2017/osteopenia   Allergies  Allergen Reactions  . Neosporin [Neomycin-Bacitracin Zn-Polymyx] Swelling, Rash and Other (See Comments)    Blisters  . Benzalkonium Chloride Other (See Comments)    Redness (Pt is unaware)  It works by killing microorganisms and inhibiting their future growth, and for this reason frequently appears as an ingredient in antibacterial hand wipes, antiseptic creams and anti-itch ointments.  . Latex Rash    Current Outpatient Medications  Medication Sig Dispense Refill  . acetaminophen (TYLENOL) 500 MG tablet Take 500 mg by mouth every 6 (six) hours as needed for moderate pain or headache.    . alclomethasone (ACLOVATE) 0.05 % cream Apply to affected area twice daily as needed for dermatitis  0  . b complex vitamins tablet Take 1 tablet by mouth daily.    Marland Kitchen ibuprofen (ADVIL,MOTRIN) 200 MG tablet Take 200 mg by mouth  every 6 (six) hours as needed for headache or moderate pain.    Marland Kitchen Lifitegrast (XIIDRA) 5 % SOLN Place 1 drop into both eyes daily as needed (dry eyes).    . nitrofurantoin, macrocrystal-monohydrate, (MACROBID) 100 MG capsule Take 1 capsule (100 mg total) by mouth 2 (two) times daily. 14 capsule 0  . Propylene Glycol (SYSTANE COMPLETE) 0.6 % SOLN Place 1 drop into both eyes daily.    . vitamin C (ASCORBIC ACID) 500 MG tablet Take 500 mg by mouth daily.    Marland Kitchen VITAMIN D, CHOLECALCIFEROL, PO Take 6 drops by mouth every other day. 1 drop = 1000 international units     No current facility-administered medications for this visit.     OBJECTIVE:  Vitals:   10/17/17 0837  BP: (!) 151/71  Pulse: 73  Resp: 18  Temp: 98.9 F (37.2 C)  SpO2: 97%     Body mass index is 34.26 kg/m.   Wt Readings from Last 3 Encounters:  10/17/17 232 lb (105.2 kg)  10/05/17 227 lb 6.4 oz (103.1 kg)  09/25/17 234 lb 12.6 oz (106.5 kg)  ECOG FS:1 - Symptomatic but completely ambulatory GENERAL: Patient is a well appearing female in no acute distress HEENT:  Sclerae anicteric.  Oropharynx clear and moist. No ulcerations or evidence of oropharyngeal candidiasis. Neck is supple.  NODES:  No cervical, supraclavicular, or axillary lymphadenopathy palpated.  BREAST EXAM:  Left breast s/p mastectomy, healing well.  No erythema, drain present.  There is a bandage on her left chest wall under her axilla where the drain previously was.  It is clean, dry and intact. LUNGS:  Clear to auscultation bilaterally.  No wheezes or rhonchi. HEART:  Regular rate and rhythm. No murmur appreciated. ABDOMEN:  Soft, nontender.  Positive, normoactive bowel sounds. No organomegaly palpated. MSK:  No focal spinal tenderness to palpation. Full range of motion bilaterally in the upper extremities. EXTREMITIES:  No peripheral edema.   SKIN:  Clear with no obvious rashes or skin changes. No nail dyscrasia. NEURO:  Nonfocal. Well oriented.   Appropriate affect.    LAB RESULTS:  CMP     Component Value Date/Time   NA 141 09/18/2017 1320   NA 141 03/03/2017 1358   K 3.6 09/18/2017 1320   K 3.3 (L) 03/03/2017 1358   CL 107 09/18/2017 1320   CO2 25 09/18/2017 1320   CO2  28 03/03/2017 1358   GLUCOSE 94 09/18/2017 1320   GLUCOSE 107 03/03/2017 1358   BUN 9 09/18/2017 1320   BUN 12.7 03/03/2017 1358   CREATININE 0.74 09/18/2017 1320   CREATININE 0.79 07/11/2017 1040   CREATININE 0.9 03/03/2017 1358   CALCIUM 9.4 09/18/2017 1320   CALCIUM 9.4 03/03/2017 1358   PROT 6.5 08/14/2017 1225   PROT 6.5 03/03/2017 1358   ALBUMIN 3.9 08/14/2017 1225   ALBUMIN 3.7 03/03/2017 1358   AST 17 08/14/2017 1225   AST 17 07/11/2017 1040   AST 23 03/03/2017 1358   ALT 16 08/14/2017 1225   ALT 19 07/11/2017 1040   ALT 25 03/03/2017 1358   ALKPHOS 96 08/14/2017 1225   ALKPHOS 71 03/03/2017 1358   BILITOT 0.5 08/14/2017 1225   BILITOT 0.6 07/11/2017 1040   BILITOT 0.52 03/03/2017 1358   GFRNONAA >60 09/18/2017 1320   GFRNONAA >60 07/11/2017 1040   GFRAA >60 09/18/2017 1320   GFRAA >60 07/11/2017 1040    No results found for: TOTALPROTELP, ALBUMINELP, A1GS, A2GS, BETS, BETA2SER, GAMS, MSPIKE, SPEI  No results found for: Nils Pyle, [facility]  Lab Results  Component Value Date   WBC 5.2 09/18/2017   NEUTROABS 1.9 08/14/2017   HGB 12.6 09/18/2017   HCT 40.2 09/18/2017   MCV 92.4 09/18/2017   PLT 259 09/18/2017      Chemistry      Component Value Date/Time   NA 141 09/18/2017 1320   NA 141 03/03/2017 1358   K 3.6 09/18/2017 1320   K 3.3 (L) 03/03/2017 1358   CL 107 09/18/2017 1320   CO2 25 09/18/2017 1320   CO2 28 03/03/2017 1358   BUN 9 09/18/2017 1320   BUN 12.7 03/03/2017 1358   CREATININE 0.74 09/18/2017 1320   CREATININE 0.79 07/11/2017 1040   CREATININE 0.9 03/03/2017 1358      Component Value Date/Time   CALCIUM 9.4 09/18/2017 1320   CALCIUM 9.4 03/03/2017 1358   ALKPHOS 96 08/14/2017  1225   ALKPHOS 71 03/03/2017 1358   AST 17 08/14/2017 1225   AST 17 07/11/2017 1040   AST 23 03/03/2017 1358   ALT 16 08/14/2017 1225   ALT 19 07/11/2017 1040   ALT 25 03/03/2017 1358   BILITOT 0.5 08/14/2017 1225   BILITOT 0.6 07/11/2017 1040   BILITOT 0.52 03/03/2017 1358       No results found for: LABCA2  No components found for: [id number]  No results for input(s): INR in the last 168 hours.  No results found for: LABCA2  No results found for: ZHG992  No results found for: EQA834  No results found for: HDQ222  No results found for: CA2729  No components found for: HGQUANT  No results found for: CEA1 / No results found for: CEA1   No results found for: AFPTUMOR  No results found for: CHROMOGRNA  No results found for: PSA1  No visits with results within 3 Day(s) from this visit.  Latest known visit with results is:  Office Visit on 10/05/2017  Component Date Value Ref Range Status  . Color, UA 10/05/2017 yellow   Final  . Clarity, UA 10/05/2017 cloudy   Final  . Glucose, UA 10/05/2017 Negative  Negative Final  . Bilirubin, UA 10/05/2017 negative   Final  . Ketones, UA 10/05/2017 negative   Final  . Spec Grav, UA 10/05/2017 >=1.030* 1.010 - 1.025 Final  . Blood, UA 10/05/2017 negative   Final  . pH, UA  10/05/2017 5.0  5.0 - 8.0 Final  . Protein, UA 10/05/2017 Negative  Negative Final  . Urobilinogen, UA 10/05/2017 0.2  0.2 or 1.0 E.U./dL Final  . Nitrite, UA 10/05/2017 negative   Final  . Leukocytes, UA 10/05/2017 Moderate (2+)* Negative Final  . MICRO NUMBER: 10/05/2017 [id number]   Final  . SPECIMEN QUALITY: 10/05/2017 ADEQUATE   Final  . Sample Source 10/05/2017 NOT GIVEN   Final  . STATUS: 10/05/2017 FINAL   Final  . ISOLATE 1: 10/05/2017 Single organism less than 10,000 CFU/mL isolated. These organisms, commonly found on external and internal genitalia, are considered colonizers. No further testing performed.   Final    (this displays the last labs  from the last 3 days)  No results found for: TOTALPROTELP, ALBUMINELP, A1GS, A2GS, BETS, BETA2SER, GAMS, MSPIKE, SPEI (this displays SPEP labs)  No results found for: KPAFRELGTCHN, LAMBDASER, KAPLAMBRATIO (kappa/lambda light chains)  No results found for: HGBA, HGBA2QUANT, HGBFQUANT, HGBSQUAN (Hemoglobinopathy evaluation)   No results found for: LDH  No results found for: IRON, TIBC, IRONPCTSAT (Iron and TIBC)  No results found for: FERRITIN  Urinalysis    Component Value Date/Time   COLORURINE YELLOW 05/29/2015 [place] 05/29/2015 1113   LABSPEC 1.023 05/29/2015 1113   PHURINE 5.5 05/29/2015 1113   GLUCOSEU NEGATIVE 05/29/2015 1113   GLUCOSEU NEGATIVE 11/05/2012 1559   HGBUR NEGATIVE 05/29/2015 1113   BILIRUBINUR negative 10/05/2017 1710   KETONESUR NEGATIVE 05/29/2015 1113   PROTEINUR Negative 10/05/2017 1710   PROTEINUR NEGATIVE 05/29/2015 1113   UROBILINOGEN 0.2 10/05/2017 1710   UROBILINOGEN 0.2 11/05/2012 1559   NITRITE negative 10/05/2017 1710   NITRITE NEGATIVE 05/29/2015 1113   LEUKOCYTESUR Moderate (2+) (A) 10/05/2017 1710     STUDIES: No results found.  ELIGIBLE FOR AVAILABLE RESEARCH PROTOCOL: No  ASSESSMENT: 67 y.o. [place] woman status post bilateral biopsies 01/26/2017, showing  (1) in the right breast, a complex sclerosing lesion  (2) in the left breast, a cT2 pN1 invasive ductal carcinoma (with some lobular features but E-cadherin positive), grade 2, estrogen and progesterone receptor positive, HER-2 not amplified, with an MIB-1 of 15-20%  (a) breast MRI 02/04/2017 suggests a T3 N1 tumor  (3) started neoadjuvant cyclophosphamide/docetaxel 03/03/2017, discontinued after 1 cycle with very poor tolerance  (a) started cyclophosphamide/methotrexate/fluorouracil (CMF) 03/28/2017, to be repeated for 7 cycles 21 days apart  (4) Surgery with Dr. Marlou Starks on 09/25/17: right breast lumpectomy: [facility], [place], left breast mastectomy: IDC, grade 2,  6.2cm, 9/13 lymph nodes positive with extracapsular extension ER 90%, PR 100%, HER-2 neg (ratio 0.95).  (5) adjuvant radiation with Capecitabine pending  ( 6) to start antiestrogens at the completion of local treatment  (7) consider genetics counseling  (8) PD-L1 and foundation 1 testing pending (requested 10/17/2017 was (  PLAN: Brooke [name] is doing well today and her surgical site looks very good.  It is healing as expected.  We reviewed her pathology results during today's visit.  She also met with Dr. [name] to review her plan, which includes Capecitabine 2 tabs BID to be taken on radiation therapy days.  Risks/Benefits were reviewed with her in detail.    Anti estrogen therapy following radiation therapy was also reviewed with Brooke [name], along with potentially Zometa.  She will return after she completes radiation to discuss further.  In the meantime, we need to get a bone density on her.  Her last bone density was in 05/2015 and demonstrated osteopenia with a t score of -1.3.  She normally has these with Dr. Phineas Real, so she plans on calling his office to get this done.  She will let me know if she has any issues with this.  If so, we will order one at the breast center.    We will see Brooke [name] back in about 2 months.  She knows to call for any other issues that may develop before the next visit.   Brooke Bihari, NP  10/17/17 9:07 AM Medical Oncology and Hematology [facility] [address] [place], [place] [address] Tel. [phone]    Fax. [phone]   ADDENDUM: I reviewed the pathology results with [facility].  She is appropriately concerned regarding the risk of metastatic recurrence given the very high number of positive lymph nodes.  We are going to try to optimize her systemic therapy.  I do not think more chemotherapy is warranted at this point since she did not have such a good response to the chemotherapy she did receive and since she really was not able to tolerate  standard chemotherapy very well.  We are adding capecitabine to her radiation as a sensitizer.  We discussed that at length today and she has a good understanding of the possible toxicities side effects and complications.  We are not going at the full 30-monthfull treatment capecitabine which is appropriate for triple negative tumors.  Instead she will be started on antiestrogens and very likely also zolendronate every 6 months when she returns to see me after radiation treatments.  We are requesting a PD-L1 and foundation one study on her pathology to see if there are additional treatment options.  She knows to call for any other issues that may develop before her next visit.   I personally saw this patient and performed a substantive portion of this encounter with the listed APP documented above.   GChauncey Cruel MD Medical Oncology and Hematology [facility] [id number]. [phone]   Fax. [phone]

## 2017-10-17 NOTE — Telephone Encounter (Signed)
Oral Oncology Pharmacist Encounter  Received new referral for Xeloda (capecitabine) for the adjuvant treatment of hormone recptor positive breat cancer in conjunction with radiation, planned duration 5 1/2 - 6 weeks.  Labs from Epic assessed, [place] for treatment.  Xeloda will be dosed at ~440 mg/m2 BID (1000mg  BID) on radiation days only as a radiosensitizer. Radiation schedule and length of treatment will be determined after RadOnc consult and CT simulation scheduled for later this week.  Current medication list in Epic reviewed, no DDIs with Xeloda identified.  Prescription will be sent to appropriate specialty pharmacy for dispensing once received by MD. Insurance authorization will be submitted in anticipation of prescription.  Oral Oncology Clinic will continue to follow for insurance authorization, copayment issues, initial counseling and start date.  Johny Drilling, PharmD, BCPS, BCOP  10/17/2017 2:10 PM Oral Oncology Clinic [phone]

## 2017-10-17 NOTE — Telephone Encounter (Signed)
Dr.Fontaine patient is schedule on 10/26/17 here for dexa. Just wanted you to know this.

## 2017-10-17 NOTE — Telephone Encounter (Signed)
Patient called requesting dexa order placed, last dexa in 05/2015 due this year. Dr.Marginat requesting dexa as well. Order placed. Will have appointment desk schedule.

## 2017-10-17 NOTE — Telephone Encounter (Signed)
Gave avs and calendar ° °

## 2017-10-17 NOTE — Telephone Encounter (Signed)
Okay 

## 2017-10-18 MED ORDER — CAPECITABINE 500 MG PO TABS: ORAL_TABLET | ORAL | 0 refills | Status: DC

## 2017-10-18 NOTE — Telephone Encounter (Signed)
Oral Chemotherapy Pharmacist Encounter   Attempted to reach patient to provide update and offer for initial counseling on oral medication: Xeloda.   No answer. Left VM for patient to call back to discuss medication acquisition and to perform initial counseling.   Johny Drilling, PharmD, BCPS, BCOP  10/18/2017   11:52 AM Oral Oncology Clinic [phone]

## 2017-10-19 ENCOUNTER — Encounter: Payer: Self-pay | Admitting: Radiation Oncology

## 2017-10-19 ENCOUNTER — Other Ambulatory Visit: Payer: Self-pay

## 2017-10-19 ENCOUNTER — Ambulatory Visit
Admission: RE | Admit: 2017-10-19 | Discharge: 2017-10-19 | Disposition: A | Discharge status: Home / Self Care | Payer: Medicare Other | Source: Ambulatory Visit | Attending: Radiation Oncology | Admitting: Radiation Oncology

## 2017-10-19 VITALS — BP 144/73 | HR 73 | Temp 98.1°F | Resp 18 | Ht 69.0 in | Wt 233.2 lb

## 2017-10-19 DIAGNOSIS — M1990 Unspecified osteoarthritis, unspecified site: Secondary | ICD-10-CM | POA: Insufficient documentation

## 2017-10-19 DIAGNOSIS — Z9012 Acquired absence of left breast and nipple: Secondary | ICD-10-CM | POA: Insufficient documentation

## 2017-10-19 DIAGNOSIS — Z79899 Other long term (current) drug therapy: Secondary | ICD-10-CM | POA: Insufficient documentation

## 2017-10-19 DIAGNOSIS — Z9221 Personal history of antineoplastic chemotherapy: Secondary | ICD-10-CM | POA: Insufficient documentation

## 2017-10-19 DIAGNOSIS — Z170 Estrogen receptor positive status [ER+]: Principal | ICD-10-CM

## 2017-10-19 DIAGNOSIS — C50812 Malignant neoplasm of overlapping sites of left female breast: Secondary | ICD-10-CM

## 2017-10-19 NOTE — Progress Notes (Signed)
Radiation Oncology         [phone] ________________________________  Name: Brooke [name]        MRN: [id number]  Date of Service: 10/19/2017 DOB: 1951-01-10  CC:[name], [name], DO  [name], DO     REFERRING PHYSICIAN: [name], DO   DIAGNOSIS: The encounter diagnosis was Malignant neoplasm of overlapping sites of left breast in female, estrogen receptor positive ([place]).   HISTORY OF PRESENT ILLNESS: Brooke [name] is a 67 y.o. female who was originally seen in the multidisciplinary breast clinic in December 2018 for a new diagnosis of left breast cancer. She was noted to have a mass in the left brast for about 1 week. She underwent diagnostic imaging and this revealed a 3 cm mass at 11;00 and had contiguous changes which was originally felt to be a satellite lesion but subsequent diagnostic images revealed this as one mass. She had three abnormal appearing lymph nodes on ultrasound. Within the right breast there was a 1 cm group of calcifications. A biopsy on 01/26/17 of the right breast revealed a complex sclerosing lesion, and the left breast biopsies, two of which were obtained both revealed a grade 2 invasive ductal carcinoma, and one of her nodes were sampled and was consistent with disease. Her tumor as ER/PR positive, HER2 negative, and Ki 67 of 20%.  The patient was counseled on the rationale for neoadjuvant therapy, she began chemotherapy on 03/03/2017 with neoadjuvant cyclophosphamide and docetaxel however this was discontinued after cycle 1 due to poor tolerance.  She was able to begin cycle fosfomycin methotrexate and fluorouracil on 03/28/2017, and completed 7 cycles of this.  On her posttreatment imaging unfortunately there was not a significant change in the lesion in the left breast or significant change in her known disease in the left axilla.  She had persistence of a lesion in the right breast consistent with a known complex sclerosing lesion.  On 09/25/2017 she  underwent right lumpectomy revealing a complex sclerosing lesion with posh, as well as left mastectomy and axillary lymph node dissection.  Her final pathology revealed a grade 2 invasive ductal carcinoma measuring 6.2 cm.  Of the 13 sampled lymph nodes, 9 were positive for disease with extracapsular extension.  Her tumor was ER PR positive HER-2 negative.  She comes today to discuss the role for adjuvant radiotherapy, and has plans to undergo concurrent Xeloda administration while receiving radiotherapy as well as estrogen blockade.  PREVIOUS RADIATION THERAPY: No   PAST MEDICAL HISTORY:  Past Medical History:  Diagnosis Date  . Anemia   . Anxiety   . Arthritis   . Bilateral dry eyes   . Breast cancer, left breast ([place])   . Family history of breast cancer 02/02/2017  . Family history of colon cancer 02/02/2017  . Family history of prostate cancer in father 02/02/2017  . Fibroid   . History of kidney stones   . Hyperlipidemia   . Kidney stones   . Melanoma of upper arm ([place]) 2009   RIGHT ARM   . Obesity (BMI 30.0-34.9)   . Osteopenia 05/2015   T score -1.3 FRAX 6%/0.5% stable from prior DEXA  . PONV (postoperative nausea and vomiting)   . Squamous carcinoma     of the skin  . UTI (urinary tract infection)   . Varicose veins of both lower extremities   . Vitamin D deficiency 07/2009   LOW VITAMIN D 29  . Vitamin deficiency 07/2008  VITAMIN D LOW 23       PAST SURGICAL HISTORY: Past Surgical History:  Procedure Laterality Date  . ABDOMINAL SURGERY  1975   ovairan cyst   . APPENDECTOMY    . BREAST LUMPECTOMY WITH RADIOACTIVE SEED LOCALIZATION Right 09/25/2017   Procedure: RIGHT BREAST LUMPECTOMY WITH RADIOACTIVE SEED LOCALIZATION;  Surgeon: Jovita Kussmaul, MD;  Location: [place];  Service: General;  Laterality: Right;  . BREAST SURGERY     bio  . CHOLECYSTECTOMY    . DERMOID CYST  EXCISION    . DILATION AND CURETTAGE OF UTERUS  1998  . GALLBLADDER SURGERY    . HYSTEROSCOPY      . insertion  port a cath  03/02/2016  . MASTECTOMY MODIFIED RADICAL Left 09/25/2017   Procedure: LEFT MODIFIED RADICAL MASTECTOMY;  Surgeon: Jovita Kussmaul, MD;  Location: [place];  Service: General;  Laterality: Left;  . PORTACATH PLACEMENT N/A 03/02/2017   Procedure: INSERTION PORT-A-CATH;  Surgeon: Jovita Kussmaul, MD;  Location: [place];  Service: General;  Laterality: N/A;  . SKIN BIOPSY    . SKIN CANCER EXCISION  2010,2011   2010-RIGHT ARM, 2011-LEFT LOWER LEG.-DR Rolm Bookbinder DERM. DR. Sarajane Jews IS HER DERM SURGEON.     FAMILY HISTORY:  Family History  Problem Relation Age of Onset  . Diabetes Father   . Hypertension Father   . Heart disease Father   . Prostate cancer Father 59  . Breast cancer Sister 50       again at age 35, GT results unk  . Skin cancer Sister   . Colon cancer Paternal Grandfather 79  . Hypertension Mother   . Heart failure Mother      SOCIAL HISTORY:  reports that she has never smoked. She has never used smokeless tobacco. She reports that she does not drink alcohol or use drugs. The patient is single and lives in [place]. She works for [employer].    ALLERGIES: Neosporin [neomycin-bacitracin zn-polymyx]; Benzalkonium chloride; and Latex   MEDICATIONS:  Current Outpatient Medications  Medication Sig Dispense Refill  . acetaminophen (TYLENOL) 500 MG tablet Take 500 mg by mouth every 6 (six) hours as needed for moderate pain or headache.    . alclomethasone (ACLOVATE) 0.05 % cream Apply to affected area twice daily as needed for dermatitis  0  . b complex vitamins tablet Take 1 tablet by mouth daily.    . capecitabine (XELODA) 500 MG tablet Take 2 tablets ('1000mg'$ ) by mouth 2 times daily, immediatley after AM & PM meals. Take on radiation days only, M-F. 120 tablet 0  . ibuprofen (ADVIL,MOTRIN) 200 MG tablet Take 200 mg by mouth every 6 (six) hours as needed for headache or moderate pain.    Marland Kitchen Lifitegrast (XIIDRA) 5 % SOLN Place 1 drop into both eyes daily as  needed (dry eyes).    . nitrofurantoin, macrocrystal-monohydrate, (MACROBID) 100 MG capsule Take 1 capsule (100 mg total) by mouth 2 (two) times daily. 14 capsule 0  . Propylene Glycol (SYSTANE COMPLETE) 0.6 % SOLN Place 1 drop into both eyes daily.    . vitamin C (ASCORBIC ACID) 500 MG tablet Take 500 mg by mouth daily.    Marland Kitchen VITAMIN D, CHOLECALCIFEROL, PO Take 6 drops by mouth every other day. 1 drop = 1000 international units     No current facility-administered medications for this encounter.      REVIEW OF SYSTEMS: On review of systems, the patient reports that she is doing well  overall. She had some trouble with tolerating chemotherapy, but she is doing better in terms of fatigue and neuropathy. She denies any chest pain, shortness of breath, cough, fevers, chills, night sweats, unintended weight changes. She denies any bowel or bladder disturbances, and denies abdominal pain, nausea or vomiting. She denies any new musculoskeletal or joint aches or pains. A complete review of systems is obtained and is otherwise negative.     PHYSICAL EXAM:  Wt Readings from Last 3 Encounters:  10/17/17 232 lb (105.2 kg)  10/05/17 227 lb 6.4 oz (103.1 kg)  09/25/17 234 lb 12.6 oz (106.5 kg)   Temp Readings from Last 3 Encounters:  10/17/17 98.9 F (37.2 C) (Oral)  10/05/17 98.4 F (36.9 C) (Oral)  09/26/17 98.5 F (36.9 C) (Oral)   BP Readings from Last 3 Encounters:  10/17/17 (!) 151/71  10/05/17 138/80  09/26/17 121/65   Pulse Readings from Last 3 Encounters:  10/17/17 73  10/05/17 75  09/26/17 60     In general this is a well appearing Caucasian female in no acute distress.  She's alert and oriented x4 and appropriate throughout the examination. Cardiopulmonary assessment is negative for acute distress and she exhibits normal effort.  Examination of the left chest wall reveals a well healed left mastectomy scar and dressing over prior drain tube site. No erythema is noted, and no edema  of the chest wall is noted.   ECOG =1  0 - Asymptomatic (Fully active, able to carry on all predisease activities without restriction)  1 - Symptomatic but completely ambulatory (Restricted in physically strenuous activity but ambulatory and able to carry out work of a light or sedentary nature. For example, light housework, office work)  2 - Symptomatic, <50% in bed during the day (Ambulatory and capable of all self care but unable to carry out any work activities. Up and about more than 50% of waking hours)  3 - Symptomatic, >50% in bed, but not bedbound (Capable of only limited self-care, confined to bed or chair 50% or more of waking hours)  4 - Bedbound (Completely disabled. Cannot carry on any self-care. Totally confined to bed or chair)  5 - Death   Eustace Pen MM, Creech RH, Tormey DC, et al. [phone]). "Toxicity and response criteria of the [facility]". [facility] Oncol. 5 (6): 649-55    LABORATORY DATA:  Lab Results  Component Value Date   WBC 5.2 09/18/2017   HGB 12.6 09/18/2017   HCT 40.2 09/18/2017   MCV 92.4 09/18/2017   PLT 259 09/18/2017   Lab Results  Component Value Date   NA 141 09/18/2017   K 3.6 09/18/2017   CL 107 09/18/2017   CO2 25 09/18/2017   Lab Results  Component Value Date   ALT 16 08/14/2017   AST 17 08/14/2017   ALKPHOS 96 08/14/2017   BILITOT 0.5 08/14/2017      RADIOGRAPHY: No results found.     IMPRESSION/PLAN: 1. Stage IIA, cT2N1M0 grade 2, ER/PR positive invasive ductal carcinoma of the left breast. Dr. Lisbeth Renshaw reviews the patient's course since her last visit in our office.  Unfortunately she did not have a significant response from neoadjuvant chemotherapy, she has undergone surgical resection, and would benefit from adjuvant radiotherapy to reduce risk of local recurrence.  Dr. [name] also plans to consider Xeloda concurrently followed by antiestrogen therapy as well..We discussed the risks, benefits, short, and  long term effects of radiotherapy, and the patient is interested  in proceeding. Dr. Lisbeth Renshaw discusses the delivery and logistics of radiotherapy and anticipates a course of 6 1/2 weeks to the left chest wall, and regional nodes with deep inspiration breath-hold technique. Written consent is obtained and placed in the chart, a copy was provided to the patient.  She will be contacted to coordinate simulation by our staff.    In a visit lasting 60 minutes, greater than 50% of the time was spent face to face discussing her case, and coordinating the patient's care.   The above documentation reflects my direct findings during this shared patient visit. Please see the separate note by Dr. Lisbeth Renshaw on this date for the remainder of the patient's plan of care.    Carola Rhine, PAC

## 2017-10-19 NOTE — Addendum Note (Signed)
Encounter addended by: Malena Edman, RN on: 10/19/2017 2:53 PM  Actions taken: Charge Capture section accepted

## 2017-10-20 ENCOUNTER — Encounter: Payer: Self-pay | Admitting: Oncology

## 2017-10-20 MED ORDER — CAPECITABINE 500 MG PO TABS: ORAL_TABLET | ORAL | 0 refills | Status: DC

## 2017-10-20 NOTE — Telephone Encounter (Signed)
Oral Chemotherapy Pharmacist Encounter   I spoke with patient for overview of: Xeloda (capecitabine) for the adjuvant treatment of hormone recptor positive breat cancer in conjunction with radiation, planned duration 6 1/2 weeks.   Prescription has been resent to the pharmacy with updated quantity now that length of treatment is known.  Counseled patient on administration, dosing, side effects, monitoring, drug-food interactions, safe handling, storage, and disposal.  Patient will take Xeloda 500mg  tablets, 2 tablets (1000mg ) by mouth in AM and 2 tabs (1000mg ) by mouth in PM, within 30 minutes of finishing meals, on days of radiation only.  Xeloda and radiation start date: TBD, likely 11/06/2017  Adverse effects of Xeloda include but are not limited to: fatigue, decreased blood counts, GI upset, diarrhea, and hand-foot syndrome.  Patient has anti-emetic on hand and knows to take it if nausea develops.   Patient will obtain anti diarrheal and alert the office of 4 or more loose stools above baseline.   Reviewed with patient importance of keeping a medication schedule and plan for any missed doses.  Patient would like to pick-up entire Xeloda course from the [facility] the week of 10/31/2017. We will coordinate with pharmacy. Patient quoted copayment $50.00 for entire course.  Medication reconciliation performed and medication/allergy list updated.  Ms. Haider voiced understanding and appreciation.   All questions answered.  Patient knows to call the office with questions or concerns. Oral Oncology Clinic will continue to follow.  Thank you,  Johny Drilling, PharmD, BCPS, BCOP  10/20/2017  10:07 AM Oral Oncology Clinic [phone]

## 2017-10-23 ENCOUNTER — Ambulatory Visit: Payer: Medicare Other | Admitting: Rehabilitation

## 2017-10-24 ENCOUNTER — Ambulatory Visit: Payer: Medicare Other | Admitting: Family Medicine

## 2017-10-24 ENCOUNTER — Encounter: Payer: Self-pay | Admitting: Family Medicine

## 2017-10-24 VITALS — BP 100/80 | HR 74 | Temp 98.4°F | Ht 69.0 in | Wt 231.0 lb

## 2017-10-24 DIAGNOSIS — E785 Hyperlipidemia, unspecified: Secondary | ICD-10-CM | POA: Diagnosis not present

## 2017-10-24 DIAGNOSIS — H6122 Impacted cerumen, left ear: Secondary | ICD-10-CM | POA: Diagnosis not present

## 2017-10-24 DIAGNOSIS — R739 Hyperglycemia, unspecified: Secondary | ICD-10-CM | POA: Diagnosis not present

## 2017-10-24 DIAGNOSIS — C50812 Malignant neoplasm of overlapping sites of left female breast: Secondary | ICD-10-CM

## 2017-10-24 DIAGNOSIS — Z170 Estrogen receptor positive status [ER+]: Secondary | ICD-10-CM

## 2017-10-24 DIAGNOSIS — R6884 Jaw pain: Secondary | ICD-10-CM | POA: Diagnosis not present

## 2017-10-24 LAB — HEMOGLOBIN A1C: HEMOGLOBIN A1C: 5.6 % (ref 4.6–6.5)

## 2017-10-24 LAB — CHOLESTEROL, TOTAL: CHOLESTEROL: 234 mg/dL — AB (ref 0–200)

## 2017-10-24 LAB — HDL CHOLESTEROL: HDL: 63.8 mg/dL (ref >39.00)

## 2017-10-24 NOTE — Progress Notes (Signed)
HPI:  Using dictation device. Unfortunately this device frequently misinterprets words/phrases.  Acute visit for:  L ear feels clogged: -feels full -decreased hearing -for about 1 month -no pain, discharge, fevers  L jaw pain: -jaw clicks when she opens it -no locking, tenesmus, swelling  Hx obesity/hyperglycemia/hyperlipidemia: -will be undergoingradiation for breast ca soon  -due for labs  Metastatic Breast Ca: -seeing oncology, radiation and surgery for management -reports will be undergoing radiation starting in September and is very concerned about side effects from this and also increased risks with capecitabine     ROS: See pertinent positives and negatives per HPI.  Past Medical History:  Diagnosis Date  . Anemia   . Anxiety   . Arthritis   . Bilateral dry eyes   . Breast cancer, left breast ([place])   . Family history of breast cancer 02/02/2017  . Family history of colon cancer 02/02/2017  . Family history of prostate cancer in father 02/02/2017  . Fibroid   . History of kidney stones   . Hyperlipidemia   . Kidney stones   . Melanoma of upper arm ([place]) 2009   RIGHT ARM   . Obesity (BMI 30.0-34.9)   . Osteopenia 05/2015   T score -1.3 FRAX 6%/0.5% stable from prior DEXA  . PONV (postoperative nausea and vomiting)   . Squamous carcinoma     of the skin  . UTI (urinary tract infection)   . Varicose veins of both lower extremities   . Vitamin D deficiency 07/2009   LOW VITAMIN D 29  . Vitamin deficiency 07/2008   VITAMIN D LOW 23    Past Surgical History:  Procedure Laterality Date  . ABDOMINAL SURGERY  1975   ovairan cyst   . APPENDECTOMY    . BREAST LUMPECTOMY WITH RADIOACTIVE SEED LOCALIZATION Right 09/25/2017   Procedure: RIGHT BREAST LUMPECTOMY WITH RADIOACTIVE SEED LOCALIZATION;  Surgeon: Jovita Kussmaul, MD;  Location: [place];  Service: General;  Laterality: Right;  . BREAST SURGERY     bio  . CHOLECYSTECTOMY    . DERMOID CYST  EXCISION    .  DILATION AND CURETTAGE OF UTERUS  1998  . GALLBLADDER SURGERY    . HYSTEROSCOPY    . insertion  port a cath  03/02/2016  . MASTECTOMY MODIFIED RADICAL Left 09/25/2017   Procedure: LEFT MODIFIED RADICAL MASTECTOMY;  Surgeon: Jovita Kussmaul, MD;  Location: [place];  Service: General;  Laterality: Left;  . PORTACATH PLACEMENT N/A 03/02/2017   Procedure: INSERTION PORT-A-CATH;  Surgeon: Jovita Kussmaul, MD;  Location: [place];  Service: General;  Laterality: N/A;  . SKIN BIOPSY    . SKIN CANCER EXCISION  2010,2011   2010-RIGHT ARM, 2011-LEFT LOWER LEG.-DR Rolm Bookbinder DERM. DR. Sarajane Jews IS HER DERM SURGEON.    Family History  Problem Relation Age of Onset  . Diabetes Father   . Hypertension Father   . Heart disease Father   . Prostate cancer Father 39  . Breast cancer Sister 78       again at age 89, GT results unk  . Skin cancer Sister   . Colon cancer Paternal Grandfather 66  . Hypertension Mother   . Heart failure Mother     SOCIAL HX: see hpi   Current Outpatient Medications:  .  acetaminophen (TYLENOL) 500 MG tablet, Take 500 mg by mouth every 6 (six) hours as needed for moderate pain or headache., Disp: , Rfl:  .  alclomethasone (ACLOVATE) 0.05 % cream,  Apply to affected area twice daily as needed for dermatitis, Disp: , Rfl: 0 .  b complex vitamins tablet, Take 1 tablet by mouth daily., Disp: , Rfl:  .  capecitabine (XELODA) 500 MG tablet, Take 2 tablets (1000mg ) by mouth 2 times daily, immediatley after AM & PM meals. Take on radiation days only, M-F., Disp: 132 tablet, Rfl: 0 .  ibuprofen (ADVIL,MOTRIN) 200 MG tablet, Take 200 mg by mouth every 6 (six) hours as needed for headache or moderate pain., Disp: , Rfl:  .  Lifitegrast (XIIDRA) 5 % SOLN, Place 1 drop into both eyes daily as needed (dry eyes)., Disp: , Rfl:  .  Propylene Glycol (SYSTANE COMPLETE) 0.6 % SOLN, Place 1 drop into both eyes daily., Disp: , Rfl:  .  vitamin C (ASCORBIC ACID) 500 MG tablet, Take 500 mg by mouth  daily., Disp: , Rfl:  .  VITAMIN D, CHOLECALCIFEROL, PO, Take 6 drops by mouth every other day. 1 drop = 1000 international units, Disp: , Rfl:   EXAM:  Vitals:   10/24/17 1129  BP: 100/80  Pulse: 74  Temp: 98.4 F (36.9 C)    Body mass index is 34.11 kg/m.  GENERAL: vitals reviewed and listed above, alert, oriented, appears well hydrated and in no acute distress  HEENT: atraumatic, conjunttiva clear, no obvious abnormalities on inspection of external nose and ears, normal appearance of ear canals and TMs except for some dry skin ext ears and cerumen impaction on the R, clear nasal congestion, mild post oropharyngeal erythema with PND, no tonsillar edema or exudate, no sinus TTP, some mild deviation of th jaw and clicking L TMJ with opening and closing the mouth and some TTP around the R TMJ and MOM   NECK: no obvious masses on inspection  LUNGS: clear to auscultation bilaterally, no wheezes, rales or rhonchi, good air movement  CV: HRRR, no peripheral edema  MS: moves all extremities without noticeable abnormality  PSYCH: pleasant and cooperative, no obvious depression or anxiety  ASSESSMENT AND PLAN:  Discussed the following assessment and plan:  Impacted cerumen of left ear -dicussed options - removal with curette, lavage vs ear drops, she prefers to try drops at home first   Jaw pain -suspect TMJ but advise seeing dentist and also that she notify her oncology team  -HE for TMJ provided -advise follow up if persists  Hyperlipidemia, unspecified hyperlipidemia type - Plan: HDL cholesterol, Cholesterol, total Hyperglycemia - Plan: Hemoglobin A1c -due for labs to recheck -also discussed preventive care due and she wants to maybe do her wellness visit if she is is able prior to radiation therapy  Malignant neoplasm of overlapping sites of left breast in female, estrogen receptor positive ([place]) -seeing oncology for management, she has lots of fears about upcoming  radiation treatment and did advise she reach out to her radiation/oncology team if needed with any questions  Discussed vaccine due and timing for the flu vaccine. She wants to check with her oncology team first and advised she let us know if she wants to get any vaccine here.   She wants to hold off on colon cancer screening for now.  -Patient advised to return or notify a doctor immediately if symptoms worsen or persist or new concerns arise.  Patient Instructions  BEFORE YOU LEAVE: -TMJ handout -labs -follow up: AWV when convenient for patient  Please use debrox ear drops for the L ear wax. Please follow up for recheck if any symptoms persist.  Please  see your dentist about the TMJ and also notify your oncology team about this pain.  Please let us know if you need your vaccines here and timing.     [name], DO

## 2017-10-24 NOTE — Patient Instructions (Signed)
BEFORE YOU LEAVE: -TMJ handout -labs -follow up: AWV when convenient for patient  Please use debrox ear drops for the L ear wax. Please follow up for recheck if any symptoms persist.  Please see your dentist about the TMJ and also notify your oncology team about this pain.  Please let us know if you need your vaccines here and timing.

## 2017-10-26 ENCOUNTER — Ambulatory Visit: Payer: Medicare Other | Attending: General Surgery | Admitting: Physical Therapy

## 2017-10-26 ENCOUNTER — Ambulatory Visit (INDEPENDENT_AMBULATORY_CARE_PROVIDER_SITE_OTHER): Payer: Medicare Other

## 2017-10-26 ENCOUNTER — Ambulatory Visit
Admission: RE | Admit: 2017-10-26 | Discharge: 2017-10-26 | Disposition: A | Discharge status: Home / Self Care | Payer: Medicare Other | Source: Ambulatory Visit | Attending: Radiation Oncology | Admitting: Radiation Oncology

## 2017-10-26 ENCOUNTER — Encounter: Payer: Self-pay | Admitting: Physical Therapy

## 2017-10-26 ENCOUNTER — Encounter: Payer: Self-pay | Admitting: Gynecology

## 2017-10-26 ENCOUNTER — Other Ambulatory Visit: Payer: Self-pay | Admitting: Gynecology

## 2017-10-26 ENCOUNTER — Other Ambulatory Visit: Payer: Self-pay

## 2017-10-26 DIAGNOSIS — M8589 Other specified disorders of bone density and structure, multiple sites: Secondary | ICD-10-CM | POA: Diagnosis not present

## 2017-10-26 DIAGNOSIS — R293 Abnormal posture: Secondary | ICD-10-CM | POA: Diagnosis present

## 2017-10-26 DIAGNOSIS — M25612 Stiffness of left shoulder, not elsewhere classified: Secondary | ICD-10-CM | POA: Diagnosis present

## 2017-10-26 DIAGNOSIS — Z13820 Encounter for screening for osteoporosis: Secondary | ICD-10-CM | POA: Diagnosis not present

## 2017-10-26 DIAGNOSIS — C50212 Malignant neoplasm of upper-inner quadrant of left female breast: Secondary | ICD-10-CM | POA: Diagnosis present

## 2017-10-26 DIAGNOSIS — Z510 Encounter for antineoplastic radiation therapy: Secondary | ICD-10-CM | POA: Insufficient documentation

## 2017-10-26 DIAGNOSIS — C50812 Malignant neoplasm of overlapping sites of left female breast: Secondary | ICD-10-CM | POA: Diagnosis not present

## 2017-10-26 DIAGNOSIS — Z483 Aftercare following surgery for neoplasm: Secondary | ICD-10-CM | POA: Insufficient documentation

## 2017-10-26 DIAGNOSIS — Z170 Estrogen receptor positive status [ER+]: Secondary | ICD-10-CM | POA: Insufficient documentation

## 2017-10-26 DIAGNOSIS — M8580 Other specified disorders of bone density and structure, unspecified site: Secondary | ICD-10-CM

## 2017-10-26 NOTE — Therapy (Signed)
[place], [place], [address] Phone: [phone]   Fax:  [phone]  Physical Therapy Evaluation  Patient Details  Name: Brooke [name] MRN: [id number] Date of Birth: [birth year] Referring Provider: Dr. Autumn Messing   Encounter Date: 10/26/2017  PT End of Session - 10/26/17 0938    Visit Number  1    Number of Visits  8    Date for PT Re-Evaluation  11/23/17    PT Start Time  0840    PT Stop Time  1012    PT Time Calculation (min)  92 min    Activity Tolerance  Patient tolerated treatment well    Behavior During Therapy  [facility] [facility] for tasks assessed/performed       Past Medical History:  Diagnosis Date  . Anemia   . Anxiety   . Arthritis   . Bilateral dry eyes   . Breast cancer, left breast ([place])   . Family history of breast cancer 02/02/2017  . Family history of colon cancer 02/02/2017  . Family history of prostate cancer in father 02/02/2017  . Fibroid   . History of kidney stones   . Hyperlipidemia   . Kidney stones   . Melanoma of upper arm ([place]) 2009   RIGHT ARM   . Obesity (BMI 30.0-34.9)   . Osteopenia 05/2015   T score -1.3 FRAX 6%/0.5% stable from prior DEXA  . PONV (postoperative nausea and vomiting)   . Squamous carcinoma     of the skin  . UTI (urinary tract infection)   . Varicose veins of both lower extremities   . Vitamin D deficiency 07/2009   LOW VITAMIN D 29  . Vitamin deficiency 07/2008   VITAMIN D LOW 23    Past Surgical History:  Procedure Laterality Date  . ABDOMINAL SURGERY  1975   ovairan cyst   . APPENDECTOMY    . BREAST LUMPECTOMY WITH RADIOACTIVE SEED LOCALIZATION Right 09/25/2017   Procedure: RIGHT BREAST LUMPECTOMY WITH RADIOACTIVE SEED LOCALIZATION;  Surgeon: Jovita Kussmaul, MD;  Location: [facility];  Service: General;  Laterality: Right;  . BREAST SURGERY     bio  . CHOLECYSTECTOMY    . DERMOID CYST  EXCISION    . DILATION AND CURETTAGE OF UTERUS  1998  .  GALLBLADDER SURGERY    . HYSTEROSCOPY    . insertion  port a cath  03/02/2016  . MASTECTOMY MODIFIED RADICAL Left 09/25/2017   Procedure: LEFT MODIFIED RADICAL MASTECTOMY;  Surgeon: Jovita Kussmaul, MD;  Location: [facility];  Service: General;  Laterality: Left;  . PORTACATH PLACEMENT N/A 03/02/2017   Procedure: INSERTION PORT-A-CATH;  Surgeon: Jovita Kussmaul, MD;  Location: [facility];  Service: General;  Laterality: N/A;  . SKIN BIOPSY    . SKIN CANCER EXCISION  2010,2011   2010-RIGHT ARM, 2011-LEFT LOWER LEG.-DR Rolm Bookbinder DERM. DR. Sarajane Jews IS HER DERM SURGEON.    There were no vitals filed for this visit.   Subjective Assessment - 10/26/17 0906    Subjective  Patient underwent a left mastectomy and ALND (9/13 nodes positive) on 09/25/17 after neoadjuvant chemotherapy that did not cause any response to her tumor. She is going to begin radiation in a few weeks to the left chest, axilla and supraclavicular region per her report.     Pertinent History  Neoadjuvant chemotherapy early 2019; left mastectomy and ALND (9/13 nodes positive) on 09/25/17.    Patient Stated Goals  Get my  arm moving normally.    Currently in Pain?  Yes    Pain Score  3     Pain Location  Chest    Pain Orientation  Left    Pain Descriptors / Indicators  Tightness;Numbness;Tingling    Pain Type  Surgical pain    Pain Onset  1 to 4 weeks ago    Pain Frequency  Constant    Aggravating Factors   Using left arm    Pain Relieving Factors  Unknown         [facility] PT Assessment - 10/26/17 0001      Assessment   Medical Diagnosis  s/p left mastectomy and ALND    Referring Provider  Dr. Autumn Messing    Onset Date/Surgical Date  09/25/17    Hand Dominance  Left    Prior Therapy  None      Precautions   Precautions  Other (comment)    Precaution Comments  Left arm lymphedema risk      Restrictions   Weight Bearing Restrictions  No      Balance Screen   Has the patient fallen in the past 6 months  No    Has the patient had  a decrease in activity level because of a fear of falling?   No    Is the patient reluctant to leave their home because of a fear of falling?   No      Home Environment   Living Environment  Private residence    Living Arrangements  Alone    Available Help at Discharge  Friend(s)      Prior Function   Level of [place]  Retired    Leisure  Not exercising      Cognition   Overall Cognitive Status  Within Functional Limits for tasks assessed      Observation/Other Assessments   Observations  Mastectomy incision appears to be healing well      Sensation   Additional Comments  Numbness present left medial upper arm neural tension present left upper extremity.      Posture/Postural Control   Posture/Postural Control  Postural limitations    Postural Limitations  Rounded Shoulders;Forward head      ROM / Strength   AROM / PROM / Strength  AROM;Strength      AROM   AROM Assessment Site  Shoulder;Cervical    Right/Left Shoulder  Right;Left    Right Shoulder Extension  55 Degrees    Right Shoulder Flexion  152 Degrees    Right Shoulder ABduction  165 Degrees    Right Shoulder Internal Rotation  61 Degrees    Right Shoulder External Rotation  75 Degrees    Left Shoulder Extension  56 Degrees    Left Shoulder Flexion  100 Degrees    Left Shoulder ABduction  91 Degrees    Left Shoulder Internal Rotation  65 Degrees    Left Shoulder External Rotation  64 Degrees    Cervical Flexion  WNL    Cervical Extension  WNL    Cervical - Right Side Bend  WNL    Cervical - Left Side Bend  WNL    Cervical - Right Rotation  WNL    Cervical - Left Rotation  WNL        LYMPHEDEMA/ONCOLOGY QUESTIONNAIRE - 10/26/17 0920      Type   Cancer Type  Left breast cancer      Surgeries  Mastectomy Date  09/25/17    Axillary Lymph Node Dissection Date  09/25/17    Number Lymph Nodes Removed  13      Treatment   Active Chemotherapy Treatment  No    Past  Chemotherapy Treatment  Yes    Date  08/01/17    Active Radiation Treatment  No    Past Radiation Treatment  No    Current Hormone Treatment  No    Past Hormone Therapy  No      What other symptoms do you have   Are you Having Heaviness or Tightness  Yes    Are you having Pain  Yes    Are you having pitting edema  No    Is it Hard or Difficult finding clothes that fit  No    Do you have infections  No    Is there Decreased scar mobility  Yes    Stemmer Sign  No      Lymphedema Assessments   Lymphedema Assessments  Upper extremities      Right Upper Extremity Lymphedema   15 cm Proximal to Olecranon Process  34 cm    10 cm Proximal to Olecranon Process  32.2 cm    Olecranon Process  30.1 cm    15 cm Proximal to Ulnar Styloid Process  29.4 cm    10 cm Proximal to Ulnar Styloid Process  26 cm    Just Proximal to Ulnar Styloid Process  18.8 cm    Across Hand at [employer]  19.8 cm    At [place] of 2nd Digit  6.6 cm      Left Upper Extremity Lymphedema   15 cm Proximal to Olecranon Process  33.7 cm    10 cm Proximal to Olecranon Process  32.1 cm    Olecranon Process  28.7 cm    15 cm Proximal to Ulnar Styloid Process  27.8 cm    10 cm Proximal to Ulnar Styloid Process  24.4 cm    Just Proximal to Ulnar Styloid Process  17.2 cm    Across Hand at [employer]  19.5 cm    At [place] of 2nd Digit  6.6 cm          Quick Dash - 10/26/17 0001    Open a tight or new jar  Unable    Do heavy household chores (wash walls, wash floors)  Moderate difficulty    Carry a shopping bag or briefcase  Moderate difficulty    Wash your back  Severe difficulty    Use a knife to cut food  Unable    Recreational activities in which you take some force or impact through your arm, shoulder, or hand (golf, hammering, tennis)  Unable    During the past week, to what extent has your arm, shoulder or hand problem interfered with your normal social activities with family, friends, neighbors, or  groups?  Quite a bit    During the past week, to what extent has your arm, shoulder or hand problem limited your work or other regular daily activities  Quite a bit    Arm, shoulder, or hand pain.  Moderate    Tingling (pins and needles) in your arm, shoulder, or hand  Moderate    Difficulty Sleeping  Moderate difficulty    DASH Score  70.45 %        Objective measurements completed on examination: See above findings.      [facility] Adult  PT Treatment/Exercise - 10/26/17 0001      Exercises   Exercises  Shoulder      Shoulder Exercises: Pulleys   Flexion  2 minutes    ABduction  2 minutes    ABduction Limitations  Pulleys done with PT verbal cues for technique and to work in painfree ROM      Shoulder Exercises: Therapy Ball   Flexion  10 reps    ABduction  10 reps    ABduction Limitations  With PT verbal cues for technique      Manual Therapy   Manual Therapy  Passive ROM;Myofascial release    Manual therapy comments  Manual therapy performed by Collie Siad, PTA    Myofascial Release  To left axillary region    Passive ROM  PROM left shoulder flexion and abduction in supine to pt tolerance focused on radiation positioning             PT Education - 10/26/17 0938    Education Details  Post op shoulder ROM HEP; lymphedema education    Person(s) Educated  Patient    Methods  Explanation;Demonstration    Comprehension  Returned demonstration;Verbalized understanding          PT Long Term Goals - 10/26/17 1017      PT LONG TERM GOAL #1   Title  Patient will be independent with her HEP for shoulder ROM.    Time  4    Period  Weeks    Status  New      PT LONG TERM GOAL #2   Title  Patient will demonstrate left shoulder active flexion increased to >/= 150 degrees for increased ease reaching overhead.    Baseline  100 degrees    Time  4    Period  Weeks    Status  New      PT LONG TERM GOAL #3   Title  Patient will demonstrate left shoulder active  abduction increased to >/= 160 degrees for increased ease obtaining radiation positioning.    Baseline  91 degrees    Time  4    Period  Weeks    Status  New      PT LONG TERM GOAL #4   Title  Patient will reduce DASH score to </= 20 to demonstrate overall increased left arm function.    Baseline  70.45    Time  4    Period  Weeks    Status  New      PT LONG TERM GOAL #5   Title  Patient will verbalize good understanding of lymphedema risk reduction practices.    Time  4    Period  Weeks    Status  New             Plan - 10/26/17 1937    Clinical Impression Statement  Patient is 1 month s/p left mastectomy with ALND. She had 9/13 positive nodes. She had neoadjuvant chemotherapy but her tumor did not respond to that. She will undergo radiation in a few weeks. Her ROM is very limited and she has high risk for lymphedema. She had many questions and concerns about side effects of radiation and reported she was not convinced she wanted to do it. Much time spent discussing this and how some of those side effects (skin tightness, fibrosis, ROM limitations, lymphedema risk, etc) could be addressed with physical therapy. She had significant increase in ROM after treatment today.    History and  Personal Factors relevant to plan of care:  Lives alone and has little outside support; significant risk for lymphedema; unknown extent of disease    Clinical Presentation  Evolving    Clinical Presentation due to:  Unknown extent of disease; will begin radiation soon which will alter her skin and may effect ROM    Clinical Decision Making  Moderate    Rehab Potential  Excellent    Clinical Impairments Affecting Rehab Potential  Will undergo extensive radiation    PT Frequency  2x / week    PT Duration  4 weeks    PT Treatment/Interventions  ADLs/Self Care Home Management;Therapeutic exercise;DME Instruction;Patient/family education;Manual techniques;Passive range of motion;Scar mobilization;Manual  lymph drainage    PT Next Visit Plan  PROM, ROM exercises; will attend ABC class 11/13/17    PT Home Exercise Plan  Post op shoulder ROM HEP    Consulted and Agree with Plan of Care  Patient       Patient will benefit from skilled therapeutic intervention in order to improve the following deficits and impairments:  Decreased skin integrity, Decreased scar mobility, Decreased knowledge of precautions, Decreased knowledge of use of DME, Increased fascial restricitons, Impaired UE functional use, Pain, Decreased range of motion, Postural dysfunction  Visit Diagnosis: Malignant neoplasm of upper-inner quadrant of left breast in female, estrogen receptor positive ([place]) - Plan: PT plan of care cert/re-cert  Abnormal posture - Plan: PT plan of care cert/re-cert  Aftercare following surgery for neoplasm - Plan: PT plan of care cert/re-cert  Stiffness of left shoulder, not elsewhere classified - Plan: PT plan of care cert/re-cert     Problem List Patient Active Problem List   Diagnosis Date Noted  . Cancer of left female breast  ([place]) 09/25/2017  . Melanoma in situ of right upper extremity ([place]) 07/18/2017  . Port-A-Cath in place 04/18/2017  . Drug-induced neutropenia ([place]) 03/09/2017  . Family history of breast cancer 02/02/2017  . Family history of colon cancer 02/02/2017  . Family history of prostate cancer in father 02/02/2017  . Malignant neoplasm of overlapping sites of left breast in female, estrogen receptor positive ([place]) 01/31/2017  . Hx of hot flashes, menopausal, HRT in the past 12/19/2011  . Osteopenia, stable, DEXA 2013, recs to repeat in 2018 12/19/2011  . Hx of Hyperlipidemia, LDL 144 in 07/2011 12/19/2011  . Vitamin D insufficiency 12/19/2011  . Fibroid    Annia Friendly, [name] 10/26/17 12:48 PM  [place], [place], [address] Phone: [phone]   Fax:  [phone]  Name: Brooke [name] MRN: [id number] Date of Birth: 1950-08-11

## 2017-10-30 NOTE — Progress Notes (Deleted)
Subjective:   Brooke [name] is a 67 y.o. female who presents for an Initial Medicare Annual Wellness Visit.  Reports health as To call the patient She is currently receiving radiation and started Part B 02/2017 She will need a Welcome to Medicare when feeling up to it  Diet  Exercise  Health Maintenance Due  Topic Date Due  . COLONOSCOPY  12/08/2000  . PNA vac Low Risk Adult (2 of 2 - PPSV23) 05/23/2017  . INFLUENZA VACCINE  09/28/2017   Currently under treatment for breast cancer        Objective:    There were no vitals filed for this visit. There is no height or weight on file to calculate BMI.  Advanced Directives 10/26/2017 10/19/2017 09/25/2017 09/18/2017 03/02/2017 03/01/2017 02/02/2017  Does Patient Have a Medical Advance Directive? No No No No No No No  Would patient like information on creating a medical advance directive? No - Patient declined - Yes (ED - Information included in AVS) - No - Patient declined - No - Patient declined    Current Medications (verified) Outpatient Encounter Medications as of 10/31/2017  Medication Sig  . acetaminophen (TYLENOL) 500 MG tablet Take 500 mg by mouth every 6 (six) hours as needed for moderate pain or headache.  . alclomethasone (ACLOVATE) 0.05 % cream Apply to affected area twice daily as needed for dermatitis  . b complex vitamins tablet Take 1 tablet by mouth daily.  . capecitabine (XELODA) 500 MG tablet Take 2 tablets (1000mg ) by mouth 2 times daily, immediatley after AM & PM meals. Take on radiation days only, M-F.  . ibuprofen (ADVIL,MOTRIN) 200 MG tablet Take 200 mg by mouth every 6 (six) hours as needed for headache or moderate pain.  Marland Kitchen Lifitegrast (XIIDRA) 5 % SOLN Place 1 drop into both eyes daily as needed (dry eyes).  . Propylene Glycol (SYSTANE COMPLETE) 0.6 % SOLN Place 1 drop into both eyes daily.  . vitamin C (ASCORBIC ACID) 500 MG tablet Take 500 mg by mouth daily.  Marland Kitchen VITAMIN D, CHOLECALCIFEROL, PO Take 6 drops by  mouth every other day. 1 drop = 1000 international units  . [DISCONTINUED] prochlorperazine (COMPAZINE) 10 MG tablet Take 1 tablet (10 mg total) by mouth every 6 (six) hours as needed (Nausea or vomiting).   No facility-administered encounter medications on file as of 10/31/2017.     Allergies (verified) Neosporin [neomycin-bacitracin zn-polymyx]; Benzalkonium chloride; and Latex   History: Past Medical History:  Diagnosis Date  . Anemia   . Anxiety   . Arthritis   . Bilateral dry eyes   . Breast cancer, left breast ([place])   . Family history of breast cancer 02/02/2017  . Family history of colon cancer 02/02/2017  . Family history of prostate cancer in father 02/02/2017  . Fibroid   . History of kidney stones   . Hyperlipidemia   . Kidney stones   . Melanoma of upper arm ([place]) 2009   RIGHT ARM   . Obesity (BMI 30.0-34.9)   . Osteopenia 09/2017   T score -1.9 max 8% / 0.8% statistically significant decline at right and left hip stable at the spine  . PONV (postoperative nausea and vomiting)   . Squamous carcinoma     of the skin  . UTI (urinary tract infection)   . Varicose veins of both lower extremities   . Vitamin D deficiency 07/2009   LOW VITAMIN D 29  . Vitamin deficiency 07/2008   VITAMIN  D LOW 23   Past Surgical History:  Procedure Laterality Date  . ABDOMINAL SURGERY  1975   ovairan cyst   . APPENDECTOMY    . BREAST LUMPECTOMY WITH RADIOACTIVE SEED LOCALIZATION Right 09/25/2017   Procedure: RIGHT BREAST LUMPECTOMY WITH RADIOACTIVE SEED LOCALIZATION;  Surgeon: Jovita Kussmaul, MD;  Location: [facility];  Service: General;  Laterality: Right;  . BREAST SURGERY     bio  . CHOLECYSTECTOMY    . DERMOID CYST  EXCISION    . DILATION AND CURETTAGE OF UTERUS  1998  . GALLBLADDER SURGERY    . HYSTEROSCOPY    . insertion  port a cath  03/02/2016  . MASTECTOMY MODIFIED RADICAL Left 09/25/2017   Procedure: LEFT MODIFIED RADICAL MASTECTOMY;  Surgeon: Jovita Kussmaul, MD;  Location: [facility];  Service: General;  Laterality: Left;  . PORTACATH PLACEMENT N/A 03/02/2017   Procedure: INSERTION PORT-A-CATH;  Surgeon: Jovita Kussmaul, MD;  Location: [facility];  Service: General;  Laterality: N/A;  . SKIN BIOPSY    . SKIN CANCER EXCISION  2010,2011   2010-RIGHT ARM, 2011-LEFT LOWER LEG.-DR Rolm Bookbinder DERM. DR. Sarajane Jews IS HER DERM SURGEON.   Family History  Problem Relation Age of Onset  . Diabetes Father   . Hypertension Father   . Heart disease Father   . Prostate cancer Father 19  . Breast cancer Sister 39       again at age 26, GT results unk  . Skin cancer Sister   . Colon cancer Paternal Grandfather 36  . Hypertension Mother   . Heart failure Mother    Social History   Socioeconomic History  . Marital status: Single    Spouse name: Not on file  . Number of children: Not on file  . Years of education: Not on file  . Highest education level: Not on file  Occupational History  . Not on file  Social Needs  . Financial resource strain: Not on file  . Food insecurity:    Worry: Not on file    Inability: Not on file  . Transportation needs:    Medical: Not on file    Non-medical: Not on file  Tobacco Use  . Smoking status: Never Smoker  . Smokeless tobacco: Never Used  Substance and Sexual Activity  . Alcohol use: No    Frequency: Never    Comment: rare  . Drug use: No  . Sexual activity: Never    Birth control/protection: Post-menopausal  Lifestyle  . Physical activity:    Days per week: Not on file    Minutes per session: Not on file  . Stress: Not on file  Relationships  . Social connections:    Talks on phone: Not on file    Gets together: Not on file    Attends religious service: Not on file    Active member of club or organization: Not on file    Attends meetings of clubs or organizations: Not on file    Relationship status: Not on file  Other Topics Concern  . Not on file  Social History Narrative  . Not on file    Tobacco  Counseling Counseling given: Not Answered   Clinical Intake:     Activities of Daily Living In your present state of health, do you have any difficulty performing the following activities: 09/25/2017 09/18/2017  Hearing? Y Y  Comment - lt ear  Vision? N N  Difficulty concentrating or making decisions? Aggie Moats  Walking or climbing stairs? - Y  Dressing or bathing? N N  Some recent data might be hidden     Immunizations and Health Maintenance Immunization History  Administered Date(s) Administered  . Influenza Split 12/13/2012, 11/30/2016  . Influenza-Unspecified 11/29/2015  . Pneumococcal Conjugate-13 05/23/2016  . Tdap 11/24/2011   Health Maintenance Due  Topic Date Due  . COLONOSCOPY  12/08/2000  . PNA vac Low Risk Adult (2 of 2 - PPSV23) 05/23/2017  . INFLUENZA VACCINE  09/28/2017    Patient Care Team: [name], DO as PCP - General (Family Medicine) Phineas Real, Belinda Block, MD (Obstetrics and Gynecology) Rolm Bookbinder, MD as Attending Physician (Dermatology) Magrinat, Virgie Dad, MD as Consulting Physician (Oncology) Kyung Rudd, MD as Consulting Physician (Radiation Oncology) Jovita Kussmaul, MD as Consulting Physician (General Surgery) Irene Limbo, MD as Consulting Physician (Plastic Surgery) Delice Bison, Charlestine Massed, NP as Nurse Practitioner (Hematology and Oncology)  Indicate any recent Medical Services you may have received from other than [facility] providers in the past year (date may be approximate).     Assessment:   This is a routine wellness examination for [facility].  Hearing/Vision screen No exam data present  Dietary issues and exercise activities discussed:    Goals   None    Depression Screen PHQ 2/9 Scores 10/19/2017  PHQ - 2 Score 0    Fall Risk Fall Risk  10/19/2017  Falls in the past year? No     Cognitive Function:   Ad8 score reviewed for issues:  Issues making decisions:  Less interest in hobbies / activities:  Repeats questions,  stories (family complaining):  Trouble using ordinary gadgets (microwave, computer, phone):  Forgets the month or year:   Mismanaging finances:   Remembering appts:  Daily problems with thinking and/or memory: Ad8 score is=          Screening Tests Health Maintenance  Topic Date Due  . COLONOSCOPY  12/08/2000  . PNA vac Low Risk Adult (2 of 2 - PPSV23) 05/23/2017  . INFLUENZA VACCINE  09/28/2017  . MAMMOGRAM  01/27/2019  . TETANUS/TDAP  11/23/2021  . DEXA SCAN  Completed  . Hepatitis C Screening  Completed    Qualifies for Shingles Vaccine? ***       Plan:    PCP Notes ***  Health Maintenance ***  Abnormal Screens  ***  Referrals  ***  Patient concerns; ***  Nurse Concerns; ***  Next PCP apt ***      I have personally reviewed and noted the following in the patient's chart:   . Medical and social history . Use of alcohol, tobacco or illicit drugs  . Current medications and supplements . Functional ability and status . Nutritional status . Physical activity . Advanced directives . List of other physicians . Hospitalizations, surgeries, and ER visits in previous 12 months . Vitals . Screenings to include cognitive, depression, and falls . Referrals and appointments  In addition, I have reviewed and discussed with patient certain preventive protocols, quality metrics, and best practice recommendations. A written personalized care plan for preventive services as well as general preventive health recommendations were provided to patient.     Wynetta Fines, RN   10/30/2017

## 2017-10-31 ENCOUNTER — Telehealth: Payer: Self-pay

## 2017-10-31 ENCOUNTER — Ambulatory Visit: Payer: Medicare Other

## 2017-10-31 MED FILL — CAPECITABINE 500 MG TABS: 500 | 45 days supply | Qty: 132 | Fill #0

## 2017-10-31 NOTE — Telephone Encounter (Signed)
Call to Ms. Slaby, She enrolled in part B Medicare in January 2019. She will need her first "Welcome to Medicare" visit scheduled with Dr. Maudie Mercury  The apt with Manuela Schwartz at 2pm, needs to be canceled. Will await her call back to reschedule.  Wynetta Fines RN

## 2017-11-01 ENCOUNTER — Other Ambulatory Visit: Payer: Medicare Other

## 2017-11-01 ENCOUNTER — Encounter: Payer: Medicare Other | Admitting: Genetic Counselor

## 2017-11-01 ENCOUNTER — Other Ambulatory Visit: Payer: Self-pay | Admitting: Oncology

## 2017-11-01 ENCOUNTER — Ambulatory Visit: Payer: Medicare Other | Attending: General Surgery

## 2017-11-01 DIAGNOSIS — Z483 Aftercare following surgery for neoplasm: Secondary | ICD-10-CM | POA: Diagnosis present

## 2017-11-01 DIAGNOSIS — R293 Abnormal posture: Secondary | ICD-10-CM | POA: Diagnosis present

## 2017-11-01 DIAGNOSIS — M25612 Stiffness of left shoulder, not elsewhere classified: Secondary | ICD-10-CM | POA: Diagnosis present

## 2017-11-01 DIAGNOSIS — Z170 Estrogen receptor positive status [ER+]: Secondary | ICD-10-CM | POA: Diagnosis present

## 2017-11-01 DIAGNOSIS — C50212 Malignant neoplasm of upper-inner quadrant of left female breast: Secondary | ICD-10-CM

## 2017-11-01 NOTE — Progress Notes (Unsigned)
[facility] shows negative PD-L1 both in tumor infiltrating score and tumor cells score, both 0%

## 2017-11-01 NOTE — Therapy (Signed)
[place], [place], [address] Phone: [phone]   Fax:  [phone]  Physical Therapy Treatment  Patient Details  Name: Brooke [name] MRN: [id number] Date of Birth: 01-Jun-1950 Referring Provider: Dr. Autumn Messing   Encounter Date: 11/01/2017  PT End of Session - 11/01/17 1543    Visit Number  2    Number of Visits  8    Date for PT Re-Evaluation  11/23/17    PT Start Time  5625    PT Stop Time  1520    PT Time Calculation (min)  42 min    Activity Tolerance  Patient tolerated treatment well    Behavior During Therapy  [facility] for tasks assessed/performed       Past Medical History:  Diagnosis Date  . Anemia   . Anxiety   . Arthritis   . Bilateral dry eyes   . Breast cancer, left breast ([place])   . Family history of breast cancer 02/02/2017  . Family history of colon cancer 02/02/2017  . Family history of prostate cancer in father 02/02/2017  . Fibroid   . History of kidney stones   . Hyperlipidemia   . Kidney stones   . Melanoma of upper arm ([place]) 2009   RIGHT ARM   . Obesity (BMI 30.0-34.9)   . Osteopenia 09/2017   T score -1.9 max 8% / 0.8% statistically significant decline at right and left hip stable at the spine  . PONV (postoperative nausea and vomiting)   . Squamous carcinoma     of the skin  . UTI (urinary tract infection)   . Varicose veins of both lower extremities   . Vitamin D deficiency 07/2009   LOW VITAMIN D 29  . Vitamin deficiency 07/2008   VITAMIN D LOW 23    Past Surgical History:  Procedure Laterality Date  . ABDOMINAL SURGERY  1975   ovairan cyst   . APPENDECTOMY    . BREAST LUMPECTOMY WITH RADIOACTIVE SEED LOCALIZATION Right 09/25/2017   Procedure: RIGHT BREAST LUMPECTOMY WITH RADIOACTIVE SEED LOCALIZATION;  Surgeon: Jovita Kussmaul, MD;  Location: [place];  Service: General;  Laterality: Right;  . BREAST SURGERY     bio  . CHOLECYSTECTOMY    . DERMOID CYST  EXCISION     . DILATION AND CURETTAGE OF UTERUS  1998  . GALLBLADDER SURGERY    . HYSTEROSCOPY    . insertion  port a cath  03/02/2016  . MASTECTOMY MODIFIED RADICAL Left 09/25/2017   Procedure: LEFT MODIFIED RADICAL MASTECTOMY;  Surgeon: Jovita Kussmaul, MD;  Location: [place];  Service: General;  Laterality: Left;  . PORTACATH PLACEMENT N/A 03/02/2017   Procedure: INSERTION PORT-A-CATH;  Surgeon: Jovita Kussmaul, MD;  Location: [place];  Service: General;  Laterality: N/A;  . SKIN BIOPSY    . SKIN CANCER EXCISION  2010,2011   2010-RIGHT ARM, 2011-LEFT LOWER LEG.-DR Rolm Bookbinder DERM. DR. Sarajane Jews IS HER DERM SURGEON.    There were no vitals filed for this visit.  Subjective Assessment - 11/01/17 1440    Subjective  I've been doing the exercises and I'm just surprised at how tight everything feels.  (Pended)     Pertinent History  Neoadjuvant chemotherapy early 2019; left mastectomy and ALND (9/13 nodes positive) on 09/25/17.  (Pended)     Patient Stated Goals  Get my arm moving normally.  (Pended)     Currently in Pain?  No/denies  (Pended)  [facility] Adult PT Treatment/Exercise - 11/01/17 0001      Shoulder Exercises: Standing   External Rotation  AROM;Both;5 reps   in front of mirror   External Rotation Limitations  Instructed pt how to put hands behind head with elbows adducted first to decrease discomfort, then once fingers clasped behind head abduct elbows until stretch felt in pectoralis    Flexion  AROM;Both   2 reps slowly in front of mirror   Flexion Limitations  Used mirror to instruct pt in proper technique and point out compensatory techniques    ABduction  AROM;Both   2 reps slowly in front of mirror   ABduction Limitations  Mirror for visual cuing      Shoulder Exercises: Pulleys   Flexion  2 minutes;Limitations    Flexion Limitations  Pt returned therapist demonstration    ABduction  2 minutes    ABduction Limitations  VCs and demo to decrease Lt  scapular compensation      Manual Therapy   Myofascial Release  To left axillary region    Passive ROM  PROM left shoulder flexion and abduction in supine to pt tolerance focused on radiation positioning all to pts tolerance with encouragement throughout to relax                  PT Long Term Goals - 10/26/17 1017      PT LONG TERM GOAL #1   Title  Patient will be independent with her HEP for shoulder ROM.    Time  4    Period  Weeks    Status  New      PT LONG TERM GOAL #2   Title  Patient will demonstrate left shoulder active flexion increased to >/= 150 degrees for increased ease reaching overhead.    Baseline  100 degrees    Time  4    Period  Weeks    Status  New      PT LONG TERM GOAL #3   Title  Patient will demonstrate left shoulder active abduction increased to >/= 160 degrees for increased ease obtaining radiation positioning.    Baseline  91 degrees    Time  4    Period  Weeks    Status  New      PT LONG TERM GOAL #4   Title  Patient will reduce DASH score to </= 20 to demonstrate overall increased left arm function.    Baseline  70.45    Time  4    Period  Weeks    Status  New      PT LONG TERM GOAL #5   Title  Patient will verbalize good understanding of lymphedema risk reduction practices.    Time  4    Period  Weeks    Status  New            Plan - 11/01/17 1544    Clinical Impression Statement  Pt did very well with todays session. Was apprehensive with strtching at first but with encouragement was able to relax during P/ROM and manual therapy and end ROM was increased by end of session. Also began AA/ROM in gym and A/ROM in front of mirror which pt did very well with. Began educating pt regarding lymphedema risk reduction and infection prevention and continued answering her questions regarding radiation. She seemed more encouraged in general by end of session today.    Rehab Potential  Excellent    Clinical Impairments Affecting Rehab  Potential  Will undergo extensive radiation    PT Frequency  2x / week    PT Duration  4 weeks    PT Treatment/Interventions  ADLs/Self Care Home Management;Therapeutic exercise;DME Instruction;Patient/family education;Manual techniques;Passive range of motion;Scar mobilization;Manual lymph drainage    PT Next Visit Plan  Cont PROM to Lt shoulder and myofascial release to cording in Lt axilla, ROM exercises; will attend ABC class 11/13/17    Consulted and Agree with Plan of Care  Patient       Patient will benefit from skilled therapeutic intervention in order to improve the following deficits and impairments:  Decreased skin integrity, Decreased scar mobility, Decreased knowledge of precautions, Decreased knowledge of use of DME, Increased fascial restricitons, Impaired UE functional use, Pain, Decreased range of motion, Postural dysfunction  Visit Diagnosis: Malignant neoplasm of upper-inner quadrant of left breast in female, estrogen receptor positive (HCC)  Abnormal posture  Aftercare following surgery for neoplasm  Stiffness of left shoulder, not elsewhere classified     Problem List Patient Active Problem List   Diagnosis Date Noted  . Cancer of left female breast  ([place]) 09/25/2017  . Melanoma in situ of right upper extremity ([place]) 07/18/2017  . Port-A-Cath in place 04/18/2017  . Drug-induced neutropenia ([place]) 03/09/2017  . Family history of breast cancer 02/02/2017  . Family history of colon cancer 02/02/2017  . Family history of prostate cancer in father 02/02/2017  . Malignant neoplasm of overlapping sites of left breast in female, estrogen receptor positive ([place]) 01/31/2017  . Hx of hot flashes, menopausal, HRT in the past 12/19/2011  . Osteopenia, stable, DEXA 2013, recs to repeat in 2018 12/19/2011  . Hx of Hyperlipidemia, LDL 144 in 07/2011 12/19/2011  . Vitamin D insufficiency 12/19/2011  . Fibroid     Otelia Limes, PTA 11/01/2017, 4:01 PM  [place], [place], [address] Phone: [phone]   Fax:  [phone]  Name: JOYCELIN RADLOFF MRN: [id number] Date of Birth: [birth year]

## 2017-11-02 NOTE — Telephone Encounter (Signed)
Oral Oncology Patient Advocate Encounter  Confirmed with [place] that Xel[facility] was picked up 11/02/17  [place] [facility] [place] Phone [phone] Fax [phone]

## 2017-11-02 NOTE — Progress Notes (Signed)
  Radiation Oncology         [phone]) [phone] ________________________________  Name: Brooke [name] MRN: [id number]  Date: 10/26/2017  DOB: 03-27-1950  Diagnosis DIAGNOSIS:     ICD-10-CM   1. Malignant neoplasm of overlapping sites of left breast in female, estrogen receptor positive ([place]) C50.812    Z17.0      SIMULATION AND TREATMENT PLANNING NOTE  The patient presented for simulation prior to beginning her course of radiation treatment for her diagnosis of left-sided breast cancer. The patient was placed in a supine position on a breast board. A customized vac-lock bag was also constructed and this complex treatment device will be used on a daily basis during her treatment. In this fashion, a CT scan was obtained through the chest area and an isocenter was placed near the chest wall at the upper aspect of the right chest. A breath-hold technique has also been evaluated to determine if this significantly improves the spatial relationship between the target region and the heart. Based on this analysis, a breath-hold technique has been ordered for the patient's treatment.  The patient will be planned to receive a course of radiation initially to a dose of 50.4 gray. This will consist of a 4 field technique targeting the left chest wall as well as the supraclavicular region. Therefore 2 customized medial and lateral tangent fields have been created targeting the chest wall, and also 2 additional customized fields have been designed to treat the supraclavicular region both with a left supraclavicular field and a left posterior axillary boost field. A forward planning/reduced field technique will also be evaluated to determine if this significantly improves the dose homogeneity of the overall plan. Therefore, additional customized blocks/fields may be necessary.  This initial treatment will be accomplished at 1.8 gray per fraction.   The initial plan will consist of a 3-D conformal technique. The  target volume/scar, heart and lungs have been contoured and dose volume histograms of each of these structures will be evaluated as part of the 3-D conformal treatment planning process.   It is anticipated that the patient will then receive a 10 gray boost to the surgical scar. This will be accomplished at 2 gray per fraction. The final anticipated total dose therefore will correspond to 60.4 gray.  Special treatment procedure was performed today due to the extra time and effort required by myself to plan and prepare this patient for deep inspiration breath hold technique.  I have determined cardiac sparing to be of benefit to this patient to prevent long term cardiac damage due to radiation of the heart.  Bellows were placed on the patient's abdomen. To facilitate cardiac sparing, the patient was coached by the radiation therapists on breath hold techniques and breathing practice was performed. Practice waveforms were obtained. The patient was then scanned while maintaining breath hold in the treatment position.  This image was then transferred over to the imaging specialist. The imaging specialist then created a fusion of the free breathing and breath hold scans using the chest wall as the stable structure. I personally reviewed the fusion in axial, coronal and sagittal image planes.  Excellent cardiac sparing was obtained.  I felt the patient is an appropriate candidate for breath hold and the patient will be treated as such.  The image fusion was then reviewed with the patient to reinforce the necessity of reproducible breath hold.     _______________________________   Jodelle Gross, MD, PhD

## 2017-11-02 NOTE — Progress Notes (Signed)
  Radiation Oncology         [phone] ________________________________  Name: Brooke [name] MRN: [id number]  Date: 10/26/2017  DOB: Jun 10, 1950  Optical Surface Tracking Plan:  Since intensity modulated radiotherapy (IMRT) and 3D conformal radiation treatment methods are predicated on accurate and precise positioning for treatment, intrafraction motion monitoring is medically necessary to ensure accurate and safe treatment delivery.  The ability to quantify intrafraction motion without excessive ionizing radiation dose can only be performed with optical surface tracking. Accordingly, surface imaging offers the opportunity to obtain 3D measurements of patient position throughout IMRT and 3D treatments without excessive radiation exposure.  I am ordering optical surface tracking for this patient's upcoming course of radiotherapy. ________________________________  Kyung Rudd, MD 11/02/2017 8:57 AM    Reference:   Particia Jasper, et al. Surface imaging-based analysis of intrafraction motion for breast radiotherapy patients.Journal of [place], n. 6, nov. 2014. ISSN [id number].   Available at: <http://www.jacmp.org/index.php/jacmp/article/view/4957>.

## 2017-11-03 ENCOUNTER — Encounter

## 2017-11-03 DIAGNOSIS — Z170 Estrogen receptor positive status [ER+]: Secondary | ICD-10-CM | POA: Diagnosis not present

## 2017-11-03 DIAGNOSIS — C50812 Malignant neoplasm of overlapping sites of left female breast: Secondary | ICD-10-CM | POA: Diagnosis not present

## 2017-11-03 DIAGNOSIS — Z510 Encounter for antineoplastic radiation therapy: Secondary | ICD-10-CM | POA: Insufficient documentation

## 2017-11-06 ENCOUNTER — Ambulatory Visit
Admission: RE | Admit: 2017-11-06 | Discharge: 2017-11-06 | Disposition: A | Discharge status: Home / Self Care | Payer: Medicare Other | Source: Ambulatory Visit | Attending: Radiation Oncology | Admitting: Radiation Oncology

## 2017-11-06 DIAGNOSIS — C50812 Malignant neoplasm of overlapping sites of left female breast: Secondary | ICD-10-CM | POA: Diagnosis not present

## 2017-11-07 ENCOUNTER — Telehealth: Payer: Self-pay | Admitting: *Deleted

## 2017-11-07 ENCOUNTER — Ambulatory Visit
Admission: RE | Admit: 2017-11-07 | Discharge: 2017-11-07 | Disposition: A | Discharge status: Home / Self Care | Payer: Medicare Other | Source: Ambulatory Visit | Attending: Radiation Oncology | Admitting: Radiation Oncology

## 2017-11-07 DIAGNOSIS — C50812 Malignant neoplasm of overlapping sites of left female breast: Secondary | ICD-10-CM | POA: Diagnosis not present

## 2017-11-08 ENCOUNTER — Ambulatory Visit
Admission: RE | Admit: 2017-11-08 | Discharge: 2017-11-08 | Disposition: A | Discharge status: Home / Self Care | Payer: Medicare Other | Source: Ambulatory Visit | Attending: Radiation Oncology | Admitting: Radiation Oncology

## 2017-11-08 ENCOUNTER — Ambulatory Visit: Payer: Medicare Other

## 2017-11-08 DIAGNOSIS — M25612 Stiffness of left shoulder, not elsewhere classified: Secondary | ICD-10-CM

## 2017-11-08 DIAGNOSIS — C50812 Malignant neoplasm of overlapping sites of left female breast: Secondary | ICD-10-CM | POA: Diagnosis not present

## 2017-11-08 DIAGNOSIS — R293 Abnormal posture: Secondary | ICD-10-CM

## 2017-11-08 DIAGNOSIS — Z170 Estrogen receptor positive status [ER+]: Principal | ICD-10-CM

## 2017-11-08 DIAGNOSIS — Z483 Aftercare following surgery for neoplasm: Secondary | ICD-10-CM

## 2017-11-08 DIAGNOSIS — C50212 Malignant neoplasm of upper-inner quadrant of left female breast: Secondary | ICD-10-CM

## 2017-11-08 MED ORDER — ALRA NON-METALLIC DEODORANT (RAD-ONC)
1.0000 "application " | Freq: Once | TOPICAL | Status: AC
  Administered 2017-11-08: 1 via TOPICAL

## 2017-11-08 MED ORDER — ALRA NON-METALLIC DEODORANT (RAD-ONC)
1.0000 | Freq: Once | TOPICAL | Status: DC
Start: 2017-11-08 — End: 2017-11-08

## 2017-11-08 MED ORDER — SONAFINE EX EMUL: 1.0000 "application " | Freq: Two times a day (BID) | CUTANEOUS | Status: DC

## 2017-11-08 MED ORDER — ALRA NON-METALLIC DEODORANT (RAD-ONC): 1.0000 "application " | Freq: Once | TOPICAL | Status: DC

## 2017-11-08 MED ORDER — SONAFINE EX EMUL
1.0000 "application " | Freq: Once | CUTANEOUS | Status: AC
  Administered 2017-11-08: 1 via TOPICAL

## 2017-11-08 NOTE — Patient Instructions (Signed)

## 2017-11-08 NOTE — Therapy (Signed)
[place], [place], [address] Phone: [phone]   Fax:  [phone]  Physical Therapy Treatment  Patient Details  Name: Brooke [name] MRN: [id number] Date of Birth: [birth year] Referring Provider: Dr. Autumn Messing   Encounter Date: 11/08/2017  PT End of Session - 11/08/17 1542    Visit Number  3    Number of Visits  8    Date for PT Re-Evaluation  11/23/17    PT Start Time  2633    PT Stop Time  1526    PT Time Calculation (min)  51 min    Activity Tolerance  Patient tolerated treatment well    Behavior During Therapy  [facility] for tasks assessed/performed       Past Medical History:  Diagnosis Date  . Anemia   . Anxiety   . Arthritis   . Bilateral dry eyes   . Breast cancer, left breast ([place])   . Family history of breast cancer 02/02/2017  . Family history of colon cancer 02/02/2017  . Family history of prostate cancer in father 02/02/2017  . Fibroid   . History of kidney stones   . Hyperlipidemia   . Kidney stones   . Melanoma of upper arm ([place]) 2009   RIGHT ARM   . Obesity (BMI 30.0-34.9)   . Osteopenia 09/2017   T score -1.9 max 8% / 0.8% statistically significant decline at right and left hip stable at the spine  . PONV (postoperative nausea and vomiting)   . Squamous carcinoma     of the skin  . UTI (urinary tract infection)   . Varicose veins of both lower extremities   . Vitamin D deficiency 07/2009   LOW VITAMIN D 29  . Vitamin deficiency 07/2008   VITAMIN D LOW 23    Past Surgical History:  Procedure Laterality Date  . ABDOMINAL SURGERY  1975   ovairan cyst   . APPENDECTOMY    . BREAST LUMPECTOMY WITH RADIOACTIVE SEED LOCALIZATION Right 09/25/2017   Procedure: RIGHT BREAST LUMPECTOMY WITH RADIOACTIVE SEED LOCALIZATION;  Surgeon: Jovita Kussmaul, MD;  Location: [place];  Service: General;  Laterality: Right;  . BREAST SURGERY     bio  . CHOLECYSTECTOMY    . DERMOID CYST  EXCISION     . DILATION AND CURETTAGE OF UTERUS  1998  . GALLBLADDER SURGERY    . HYSTEROSCOPY    . insertion  port a cath  03/02/2016  . MASTECTOMY MODIFIED RADICAL Left 09/25/2017   Procedure: LEFT MODIFIED RADICAL MASTECTOMY;  Surgeon: Jovita Kussmaul, MD;  Location: [place];  Service: General;  Laterality: Left;  . PORTACATH PLACEMENT N/A 03/02/2017   Procedure: INSERTION PORT-A-CATH;  Surgeon: Jovita Kussmaul, MD;  Location: [place];  Service: General;  Laterality: N/A;  . SKIN BIOPSY    . SKIN CANCER EXCISION  2010,2011   2010-RIGHT ARM, 2011-LEFT LOWER LEG.-DR Rolm Bookbinder DERM. DR. Sarajane Jews IS HER DERM SURGEON.    There were no vitals filed for this visit.  Subjective Assessment - 11/08/17 1447    Subjective  I can tell I felt better after last visit. The stretching you did really helped alot. I've started having a catch at my Lt scapula (pt pointing to spine of scapula).     Pertinent History  Neoadjuvant chemotherapy early 2019; left mastectomy and ALND (9/13 nodes positive) on 09/25/17.    Patient Stated Goals  Get my arm moving normally.  Currently in Pain?  No/denies         [facility] PT Assessment - 11/08/17 0001      AROM   Left Shoulder Flexion  126 Degrees    Left Shoulder ABduction  139 Degrees    Left Shoulder Internal Rotation  84 Degrees    Left Shoulder External Rotation  81 Degrees                   [facility] Adult PT Treatment/Exercise - 11/08/17 0001      Shoulder Exercises: Supine   External Rotation  AAROM;Left;5 reps   5 sec holds with dowel   External Rotation Limitations  Also with fingers clasped behind head and then abduct elbows 3 times.     Flexion  AAROM;Both;5 reps   5 sec holds with dowel   ABduction  AAROM;Left;5 reps   5 sec holds with dowel   Other Supine Exercises  Lt UE neural tension stretch on wall 5x, 5 sec holds. Also instructed pt how to floss nerve.      Manual Therapy   Manual Therapy  Myofascial release;Passive ROM;Neural Stretch     Myofascial Release  To left axillary region at area of cording and forearm at area of dimpling from cording    Passive ROM  PROM left shoulder flexion and abduction in supine to pt tolerance focused on radiation positioning all to pts tolerance with encouragement throughout to relax    Neural Stretch  To Lt UE             PT Education - 11/08/17 1456    Education Details  Supine dowel exercises    Person(s) Educated  Patient    Methods  Explanation;Demonstration    Comprehension  Verbalized understanding;Returned demonstration;Need further instruction          PT Long Term Goals - 10/26/17 1017      PT LONG TERM GOAL #1   Title  Patient will be independent with her HEP for shoulder ROM.    Time  4    Period  Weeks    Status  New      PT LONG TERM GOAL #2   Title  Patient will demonstrate left shoulder active flexion increased to >/= 150 degrees for increased ease reaching overhead.    Baseline  100 degrees    Time  4    Period  Weeks    Status  New      PT LONG TERM GOAL #3   Title  Patient will demonstrate left shoulder active abduction increased to >/= 160 degrees for increased ease obtaining radiation positioning.    Baseline  91 degrees    Time  4    Period  Weeks    Status  New      PT LONG TERM GOAL #4   Title  Patient will reduce DASH score to </= 20 to demonstrate overall increased left arm function.    Baseline  70.45    Time  4    Period  Weeks    Status  New      PT LONG TERM GOAL #5   Title  Patient will verbalize good understanding of lymphedema risk reduction practices.    Time  4    Period  Weeks    Status  New            Plan - 11/08/17 1543    Clinical Impression Statement  Progressed pt to include supine dowel  exercises today which she tolerated very well, though did initially require some VC and demo for correct UE positioning. Continued with manual therapy focusing on cording at axilla and P/ROM to Lt UE. Pt is very tight still due  to cording however her A/ROM has improved since start of care.     Rehab Potential  Excellent    Clinical Impairments Affecting Rehab Potential  Will undergo extensive radiation    PT Frequency  2x / week    PT Duration  4 weeks    PT Treatment/Interventions  ADLs/Self Care Home Management;Therapeutic exercise;DME Instruction;Patient/family education;Manual techniques;Passive range of motion;Scar mobilization;Manual lymph drainage    PT Next Visit Plan  Cont PROM to Lt shoulder and myofascial release to cording in Lt axilla, ROM exercises; will attend ABC class 11/13/17    Consulted and Agree with Plan of Care  Patient       Patient will benefit from skilled therapeutic intervention in order to improve the following deficits and impairments:  Decreased skin integrity, Decreased scar mobility, Decreased knowledge of precautions, Decreased knowledge of use of DME, Increased fascial restricitons, Impaired UE functional use, Pain, Decreased range of motion, Postural dysfunction  Visit Diagnosis: Malignant neoplasm of upper-inner quadrant of left breast in female, estrogen receptor positive (HCC)  Abnormal posture  Aftercare following surgery for neoplasm  Stiffness of left shoulder, not elsewhere classified     Problem List Patient Active Problem List   Diagnosis Date Noted  . Cancer of left female breast  ([place]) 09/25/2017  . Melanoma in situ of right upper extremity ([place]) 07/18/2017  . Port-A-Cath in place 04/18/2017  . Drug-induced neutropenia ([place]) 03/09/2017  . Family history of breast cancer 02/02/2017  . Family history of colon cancer 02/02/2017  . Family history of prostate cancer in father 02/02/2017  . Malignant neoplasm of overlapping sites of left breast in female, estrogen receptor positive ([place]) 01/31/2017  . Hx of hot flashes, menopausal, HRT in the past 12/19/2011  . Osteopenia, stable, DEXA 2013, recs to repeat in 2018 12/19/2011  . Hx of Hyperlipidemia, LDL 144 in  07/2011 12/19/2011  . Vitamin D insufficiency 12/19/2011  . Fibroid     Otelia Limes, PTA 11/08/2017, 3:49 PM  [place], [place], [address] Phone: [phone]   Fax:  [phone]  Name: Brooke [name] MRN: [id number] Date of Birth: 01-13-1951

## 2017-11-08 NOTE — Telephone Encounter (Signed)
No additional information

## 2017-11-09 ENCOUNTER — Encounter: Payer: Self-pay | Admitting: Family Medicine

## 2017-11-09 ENCOUNTER — Ambulatory Visit
Admission: RE | Admit: 2017-11-09 | Discharge: 2017-11-09 | Disposition: A | Discharge status: Home / Self Care | Payer: Medicare Other | Source: Ambulatory Visit | Attending: Radiation Oncology | Admitting: Radiation Oncology

## 2017-11-09 ENCOUNTER — Ambulatory Visit (INDEPENDENT_AMBULATORY_CARE_PROVIDER_SITE_OTHER): Payer: Medicare Other | Admitting: Family Medicine

## 2017-11-09 VITALS — BP 130/70 | HR 76 | Temp 98.5°F | Ht 69.0 in | Wt 240.2 lb

## 2017-11-09 DIAGNOSIS — C50012 Malignant neoplasm of nipple and areola, left female breast: Secondary | ICD-10-CM

## 2017-11-09 DIAGNOSIS — Z0000 Encounter for general adult medical examination without abnormal findings: Secondary | ICD-10-CM

## 2017-11-09 DIAGNOSIS — Z170 Estrogen receptor positive status [ER+]: Principal | ICD-10-CM

## 2017-11-09 DIAGNOSIS — Z1331 Encounter for screening for depression: Secondary | ICD-10-CM

## 2017-11-09 DIAGNOSIS — C50812 Malignant neoplasm of overlapping sites of left female breast: Secondary | ICD-10-CM | POA: Diagnosis not present

## 2017-11-09 NOTE — Patient Instructions (Signed)
BEFORE YOU LEAVE: -follow up: 4-6 months  We called your oncologist about the vaccine and colon cancer screening concerns. We will let you know when we here back from them.   Brooke [name] , Thank you for taking time to come for your Medicare Wellness Visit. I appreciate your ongoing commitment to your health goals. Please review the following plan we discussed and let me know if I can assist you in the future.   These are the goals we discussed: -Due for colon cancer screening, flu vaccine and pneumococcal vaccine - discussed options and you wanted to run it by your oncology team first so we have a call into their office and will let you know when they get back to [place]. -eat a healthy diet and get regular exercise -follow up for regular visit with your gynecologist as planned   This is a list of the screening recommended for you and due dates:  Health Maintenance  Topic Date Due  . Colon Cancer Screening  12/08/2000  . Pneumonia vaccines (2 of 2 - PPSV23) 05/23/2017  . Flu Shot  09/28/2017  . Mammogram  01/27/2019  . Tetanus Vaccine  11/23/2021  . DEXA scan (bone density measurement)  Completed  .  Hepatitis C: One time screening is recommended by Center for Disease Control  (CDC) for  adults born from [birth year] through 1965.   Completed

## 2017-11-09 NOTE — Progress Notes (Signed)
Medicare Annual Preventive Care Visit  (initial annual wellness or annual wellness exam)  Concerns and/or follow up today:  PMH significant for breast cancer - currently undergoing treatment with oncology and her team of specialists, obesity, hyperglycemia and hyperlipidemia. Lipid and diabetes labs done recently, does other labs with oncology. Due for colon ca screening, pneumonia vaccine and flu vaccine.She wonders if she could do the cologuard, but wants to run this by her oncology team first to see if they prefer she have a colonscopy. She also is concerned about getting vaccines and timing of vaccines in regards to her current chemo and radiation treatments. She reports she called her oncologist about this, but they did not return her call. Sees Dr. Phineas Real in gyn, but reports she will see him after radiation. Recommended and advised on HM measures due a number of times in the past, including 04/2016, 10/2016 including colon cancer screening.  See HM section in Epic for other details of completed HM. See scanned documentation under Media Tab for further documentation HPI, health risk assessment. See Media Tab and Care Teams sections in Epic for other providers.  ROS: negative for report of fevers, unintentional weight loss, vision changes, vision loss, hearing loss or change, chest pain, sob, hemoptysis, melena, hematochezia, hematuria, genital discharge or lesions, falls, bleeding or bruising, loc, thoughts of suicide or self harm, memory loss  1.) Patient-completed health risk assessment  - completed and reviewed, see scanned documentation  2.) Review of Medical History: -PMH, PSH, Family History and current specialty and care providers reviewed and updated and listed below  - see scanned in document in chart and below  Past Medical History:  Diagnosis Date  . Anemia   . Anxiety   . Arthritis   . Bilateral dry eyes   . Breast cancer, left breast ([place])   . Family history of breast  cancer 02/02/2017  . Family history of colon cancer 02/02/2017  . Family history of prostate cancer in father 02/02/2017  . Fibroid   . History of kidney stones   . Hyperlipidemia   . Kidney stones   . Melanoma of upper arm ([place]) 2009   RIGHT ARM   . Obesity (BMI 30.0-34.9)   . Osteopenia 09/2017   T score -1.9 max 8% / 0.8% statistically significant decline at right and left hip stable at the spine  . PONV (postoperative nausea and vomiting)   . Squamous carcinoma     of the skin  . UTI (urinary tract infection)   . Varicose veins of both lower extremities   . Vitamin D deficiency 07/2009   LOW VITAMIN D 29  . Vitamin deficiency 07/2008   VITAMIN D LOW 23    Past Surgical History:  Procedure Laterality Date  . ABDOMINAL SURGERY  1975   ovairan cyst   . APPENDECTOMY    . BREAST LUMPECTOMY WITH RADIOACTIVE SEED LOCALIZATION Right 09/25/2017   Procedure: RIGHT BREAST LUMPECTOMY WITH RADIOACTIVE SEED LOCALIZATION;  Surgeon: Jovita Kussmaul, MD;  Location: [facility];  Service: General;  Laterality: Right;  . BREAST SURGERY     bio  . CHOLECYSTECTOMY    . DERMOID CYST  EXCISION    . DILATION AND CURETTAGE OF UTERUS  1998  . GALLBLADDER SURGERY    . HYSTEROSCOPY    . insertion  port a cath  03/02/2016  . MASTECTOMY MODIFIED RADICAL Left 09/25/2017   Procedure: LEFT MODIFIED RADICAL MASTECTOMY;  Surgeon: Jovita Kussmaul, MD;  Location: [facility];  Service: General;  Laterality: Left;  . PORTACATH PLACEMENT N/A 03/02/2017   Procedure: INSERTION PORT-A-CATH;  Surgeon: Jovita Kussmaul, MD;  Location: [place] [facility];  Service: General;  Laterality: N/A;  . SKIN BIOPSY    . SKIN CANCER EXCISION  2010,2011   2010-RIGHT ARM, 2011-LEFT LOWER LEG.-DR Rolm Bookbinder DERM. DR. Sarajane Jews IS HER DERM SURGEON.    Social History   Socioeconomic History  . Marital status: Single    Spouse name: Not on file  . Number of children: Not on file  . Years of education: Not on file  . Highest education level: Not on file   Occupational History  . Not on file  Social Needs  . Financial resource strain: Not on file  . Food insecurity:    Worry: Not on file    Inability: Not on file  . Transportation needs:    Medical: Not on file    Non-medical: Not on file  Tobacco Use  . Smoking status: Never Smoker  . Smokeless tobacco: Never Used  Substance and Sexual Activity  . Alcohol use: No    Frequency: Never    Comment: rare  . Drug use: No  . Sexual activity: Never    Birth control/protection: Post-menopausal  Lifestyle  . Physical activity:    Days per week: Not on file    Minutes per session: Not on file  . Stress: Not on file  Relationships  . Social connections:    Talks on phone: Not on file    Gets together: Not on file    Attends religious service: Not on file    Active member of club or organization: Not on file    Attends meetings of clubs or organizations: Not on file    Relationship status: Not on file  . Intimate partner violence:    Fear of current or ex partner: No    Emotionally abused: No    Physically abused: No    Forced sexual activity: No  Other Topics Concern  . Not on file  Social History Narrative  . Not on file    Family History  Problem Relation Age of Onset  . Diabetes Father   . Hypertension Father   . Heart disease Father   . Prostate cancer Father 89  . Breast cancer Sister 51       again at age 8, GT results unk  . Skin cancer Sister   . Colon cancer Paternal Grandfather 50  . Hypertension Mother   . Heart failure Mother     Current Outpatient Medications on File Prior to Visit  Medication Sig Dispense Refill  . acetaminophen (TYLENOL) 500 MG tablet Take 500 mg by mouth every 6 (six) hours as needed for moderate pain or headache.    . alclomethasone (ACLOVATE) 0.05 % cream Apply to affected area twice daily as needed for dermatitis  0  . b complex vitamins tablet Take 1 tablet by mouth daily.    . capecitabine (XELODA) 500 MG tablet Take 2 tablets  (1000mg ) by mouth 2 times daily, immediatley after AM & PM meals. Take on radiation days only, M-F. 132 tablet 0  . ibuprofen (ADVIL,MOTRIN) 200 MG tablet Take 200 mg by mouth every 6 (six) hours as needed for headache or moderate pain.    Marland Kitchen Lifitegrast (XIIDRA) 5 % SOLN Place 1 drop into both eyes daily as needed (dry eyes).    . Propylene Glycol (SYSTANE COMPLETE) 0.6 % SOLN Place 1 drop into  both eyes daily.    . vitamin C (ASCORBIC ACID) 500 MG tablet Take 500 mg by mouth daily.    Marland Kitchen VITAMIN D, CHOLECALCIFEROL, PO Take 6 drops by mouth every other day. 1 drop = 1000 international units    . [DISCONTINUED] prochlorperazine (COMPAZINE) 10 MG tablet Take 1 tablet (10 mg total) by mouth every 6 (six) hours as needed (Nausea or vomiting). 30 tablet 1   No current facility-administered medications on file prior to visit.      3.) Review of functional ability and level of safety:  Any difficulty hearing?  See scanned documentation  History of falling?  See scanned documentation  Any trouble with IADLs - using a phone, using transportation, grocery shopping, preparing meals, doing housework, doing laundry, taking medications and managing money?  See scanned documentation  Advance Directives? No.   See summary of recommendations in Patient Instructions below.  4.) Physical Exam Vitals:   11/09/17 1500  BP: 130/70  Pulse: 76  Temp: 98.5 F (36.9 C)   Estimated body mass index is 35.47 kg/m as calculated from the following:   Height as of this encounter: 5\' 9"  (1.753 m).   Weight as of this encounter: 240 lb 3.2 oz (109 kg).  EKG (optional): deferred  General: alert, appear well hydrated and in no acute distress  HEENT: visual acuity grossly intact  CV: HRRR  Lungs: CTA bilaterally  Psych: pleasant and cooperative, no obvious depression or anxiety  Cognitive function grossly intact  See patient instructions for recommendations.  Education and counseling regarding the  above review of health provided with a plan for the following: -see scanned patient completed form for further details -fall prevention strategies discussed  -healthy lifestyle discussed -importance and resources for completing advanced directives discussed -see patient instructions below for any other recommendations provided  4)The following written screening schedule of preventive measures were reviewed with assessment and plan made per below, orders and patient instructions:       Alcohol screening done     Obesity Screening and counseling done     STI screening (Hep C if born [birth year]) offered and per pt wishes     Tobacco Screening done done       Pneumococcal (PPSV23 -one dose after 64, one before if risk factors), influenza yearly and hepatitis B vaccines (if high risk - end stage renal disease, IV drugs, homosexual men, live in home for mentally retarded, hemophilia receiving factors) ASSESSMENT/PLAN: has had prevnar 13, wants to run by her oncology team before getting the pneumococcal vaccine.      Screening mammograph (yearly if >40) ASSESSMENT/PLAN: seeing oncology - undergoing tx for breast ca      Screening Pap smear/pelvic exam (q2 years) ASSESSMENT/PLAN: declined, sees gyn, Dr. Phineas Real      Colorectal cancer screening (FOBT yearly or flex sig q4y or colonoscopy q10y or barium enema q4y) ASSESSMENT/PLAN: discussed options, she thinks grandfather had colon cancer - she wants her oncologist to way in on choice. She prefers cologuard, but also is considering colonoscopy. She is passed due. Reports she did not do in the past 2ndary to insurance issues and did not have a ride to get the colonoscopy. Advised assistant to put in call to oncologist for their opinion and will let pt know.      Diabetes outpatient self-management training services ASSESSMENT/PLAN: utd or done      Bone mass measurements(covered q2y if indicated - estrogen def, osteoporosis, hyperparathyroid,  vertebral abnormalities, osteoporosis or  steroids) ASSESSMENT/PLAN: utd, done with gyn      Screening for glaucoma(q1y if high risk - diabetes, FH, AA and > 50 or hispanic and > 65) ASSESSMENT/PLAN: utd per pt      Medical nutritional therapy for individuals with diabetes or renal disease ASSESSMENT/PLAN: see orders      Cardiovascular screening blood tests (lipids q5y) ASSESSMENT/PLAN: see orders and labs      Diabetes screening tests ASSESSMENT/PLAN: see orders and labs   7.) Summary:  1. Medicare annual wellness visit, initial -risk factors and conditions per above assessment were discussed and treatment, recommendations and referrals were offered per documentation above and orders and patient instructions.  Medicare annual wellness visit, initial  Screening for depression  Patient Instructions   BEFORE YOU LEAVE: -follow up: 4-6 months  We called your oncologist about the vaccine and colon cancer screening concerns. We will let you know when we here back from them.   Ms. Tackett , Thank you for taking time to come for your Medicare Wellness Visit. I appreciate your ongoing commitment to your health goals. Please review the following plan we discussed and let me know if I can assist you in the future.   These are the goals we discussed: -Due for colon cancer screening, flu vaccine and pneumococcal vaccine - discussed options and you wanted to run it by your oncology team first so we have a call into their office and will let you know when they get back to [place]. -eat a healthy diet and get regular exercise -follow up for regular visit with your gynecologist as planned   This is a list of the screening recommended for you and due dates:  Health Maintenance  Topic Date Due  . Colon Cancer Screening  12/08/2000  . Pneumonia vaccines (2 of 2 - PPSV23) 05/23/2017  . Flu Shot  09/28/2017  . Mammogram  01/27/2019  . Tetanus Vaccine  11/23/2021  . DEXA scan (bone density  measurement)  Completed  .  Hepatitis C: One time screening is recommended by Center for Disease Control  (CDC) for  adults born from [birth year] through 1965.   Completed      Brooke Kern, DO

## 2017-11-09 NOTE — Telephone Encounter (Signed)
  11/09/17-6:07pm I left a message for the pt to return my call.  CRM also created. Brooke [name]      Copied from [place] [phone]. Topic: Inquiry >> Nov 09, 2017  4:42 PM Brooke [name] wrote: Reason for CRM: pt called to inquire about her visit today w/ pcp; pt had a couple of questions to ask; contact to advise

## 2017-11-10 ENCOUNTER — Ambulatory Visit
Admission: RE | Admit: 2017-11-10 | Discharge: 2017-11-10 | Disposition: A | Discharge status: Home / Self Care | Payer: Medicare Other | Source: Ambulatory Visit | Attending: Radiation Oncology | Admitting: Radiation Oncology

## 2017-11-10 DIAGNOSIS — C50812 Malignant neoplasm of overlapping sites of left female breast: Secondary | ICD-10-CM | POA: Diagnosis not present

## 2017-11-13 ENCOUNTER — Telehealth: Payer: Self-pay | Admitting: *Deleted

## 2017-11-13 ENCOUNTER — Ambulatory Visit
Admission: RE | Admit: 2017-11-13 | Discharge: 2017-11-13 | Disposition: A | Discharge status: Home / Self Care | Payer: Medicare Other | Source: Ambulatory Visit | Attending: Radiation Oncology | Admitting: Radiation Oncology

## 2017-11-13 DIAGNOSIS — Z1211 Encounter for screening for malignant neoplasm of colon: Secondary | ICD-10-CM

## 2017-11-13 DIAGNOSIS — C50812 Malignant neoplasm of overlapping sites of left female breast: Secondary | ICD-10-CM | POA: Diagnosis not present

## 2017-11-13 NOTE — Telephone Encounter (Signed)
Please see what pt wants to do. She wanted to consult with her oncologist about the flu shot, pneumonia shot and the colon cancer screening.

## 2017-11-13 NOTE — Telephone Encounter (Signed)
  11/13/2017-I called the pt and informed her of the message below.  Patient stated she has an appt with the oncologist this Friday and will ask further questions at that time and decide.  She also asked if she could get a copy of the Medicare Annual Wellness Exam questionnaire that she completed on 9/12.    Copied from [place] [id number]. Topic: General - Other >> Nov 13, 2017 11:19 AM Janace Aris A wrote: Reason for CRM: Ebony Hail from Radiation [facility]cology called saying that the patient can have an Pneumonia Shot, and that there are no restrictions at this time.   Please call back if you need further information.

## 2017-11-14 ENCOUNTER — Ambulatory Visit
Admission: RE | Admit: 2017-11-14 | Discharge: 2017-11-14 | Disposition: A | Discharge status: Home / Self Care | Payer: Medicare Other | Source: Ambulatory Visit | Attending: Radiation Oncology | Admitting: Radiation Oncology

## 2017-11-14 ENCOUNTER — Telehealth: Payer: Self-pay | Admitting: Radiation Oncology

## 2017-11-14 DIAGNOSIS — C50812 Malignant neoplasm of overlapping sites of left female breast: Secondary | ICD-10-CM | POA: Diagnosis not present

## 2017-11-14 NOTE — Telephone Encounter (Signed)
Ok. Please order colonoscopy per pt wishes. Also please see if she wants to get vaccines here and schedule vaccine visit for any vaccines she may want. Thanks.

## 2017-11-14 NOTE — Telephone Encounter (Signed)
Called Dr. Julianne Rice office and spoke with Arville Go to confirm that Dr. Lisbeth Renshaw was agreeable to 2nd pneum. Shot and Building services engineer.

## 2017-11-14 NOTE — Telephone Encounter (Signed)
Cecille Rubin called from Dr Ida Rogue office (radiation oncology) stating he stated the pt can have the pneumonia shot and Cologuard instead of a colonoscopy.  I called the pt and informed her of this and she asked what he said about her flu shot.  I left a message for Cecille Rubin to return my call with this info.  She stated she would prefer to have a colonoscopy instead. Message sent to Dr Maudie Mercury.

## 2017-11-14 NOTE — Telephone Encounter (Signed)
-----   Message from Kyung Rudd, MD sent at 11/14/2017 10:03 AM EDT ----- Regarding: RE: Questions from Dr. Paul Half - that is fine. ----- Message ----- From: Hollace Kinnier Sent: 11/09/2017   3:34 PM EDT To: Kyung Rudd, MD, Hayden Pedro, PA-C Subject: Questions from Dr. Maudie Mercury                         Dr. Maudie Mercury wanted to know if Brooke [name] can receive her  2nd pnemonia shot and get the cologard test instead of getting a colonoscopy? Dr. Julianne Rice number is [phone].  Thank you,  Cecille Rubin

## 2017-11-15 ENCOUNTER — Telehealth: Payer: Self-pay

## 2017-11-15 ENCOUNTER — Ambulatory Visit
Admission: RE | Admit: 2017-11-15 | Discharge: 2017-11-15 | Disposition: A | Discharge status: Home / Self Care | Payer: Medicare Other | Source: Ambulatory Visit | Attending: Radiation Oncology | Admitting: Radiation Oncology

## 2017-11-15 DIAGNOSIS — C50812 Malignant neoplasm of overlapping sites of left female breast: Secondary | ICD-10-CM | POA: Diagnosis not present

## 2017-11-15 NOTE — Telephone Encounter (Signed)
Pt called to report that she believes her capecitabine have been causing some side effects. Pt started radonc treatment 1 week ago, and was supposed to start with capecitabine on radonc days. Pt had a total of 6 doses so far, and developed swelling and sore of the R upper lip area, as well as severe abdominal pain, that started a day or 2 after starting capecitabine. Pt takes 1000mg  BID po AM/PM. Pt took her morning dose today, but is afraid to take her PM dose.   Discussed with Dr.Gudena, since Dr.Magrinat out of office. Per MD, pt should stop for 3-4 days and re evaluate symptoms. If her symptoms improve, a dose reduction maybe needed. Pt to call back next week and report on her symptoms, if any. Pt verbalized understanding and will update MD on Monday.

## 2017-11-16 ENCOUNTER — Ambulatory Visit
Admission: RE | Admit: 2017-11-16 | Discharge: 2017-11-16 | Disposition: A | Discharge status: Home / Self Care | Payer: Medicare Other | Source: Ambulatory Visit | Attending: Radiation Oncology | Admitting: Radiation Oncology

## 2017-11-16 DIAGNOSIS — C50812 Malignant neoplasm of overlapping sites of left female breast: Secondary | ICD-10-CM | POA: Diagnosis not present

## 2017-11-16 NOTE — Telephone Encounter (Signed)
I called the pt and left a detailed message a copy of the form was left at the front desk for her to pick up.  The order was placed for the colonoscopy and someone will call with appt info.  I also left a message asking her to schedule a nurse visit for the vaccines when she would like to.

## 2017-11-16 NOTE — Addendum Note (Signed)
Addended by: Agnes Lawrence on: 11/16/2017 02:53 PM   Modules accepted: Orders

## 2017-11-17 ENCOUNTER — Encounter

## 2017-11-17 ENCOUNTER — Ambulatory Visit
Admission: RE | Admit: 2017-11-17 | Discharge: 2017-11-17 | Disposition: A | Discharge status: Home / Self Care | Payer: Medicare Other | Source: Ambulatory Visit | Attending: Radiation Oncology | Admitting: Radiation Oncology

## 2017-11-17 DIAGNOSIS — C50812 Malignant neoplasm of overlapping sites of left female breast: Secondary | ICD-10-CM | POA: Diagnosis not present

## 2017-11-20 ENCOUNTER — Ambulatory Visit
Admission: RE | Admit: 2017-11-20 | Discharge: 2017-11-20 | Disposition: A | Discharge status: Home / Self Care | Payer: Medicare Other | Source: Ambulatory Visit | Attending: Radiation Oncology | Admitting: Radiation Oncology

## 2017-11-20 ENCOUNTER — Ambulatory Visit: Payer: Medicare Other

## 2017-11-20 DIAGNOSIS — R293 Abnormal posture: Secondary | ICD-10-CM

## 2017-11-20 DIAGNOSIS — C50812 Malignant neoplasm of overlapping sites of left female breast: Secondary | ICD-10-CM | POA: Diagnosis not present

## 2017-11-20 DIAGNOSIS — C50212 Malignant neoplasm of upper-inner quadrant of left female breast: Secondary | ICD-10-CM | POA: Diagnosis not present

## 2017-11-20 DIAGNOSIS — M25612 Stiffness of left shoulder, not elsewhere classified: Secondary | ICD-10-CM

## 2017-11-20 DIAGNOSIS — Z483 Aftercare following surgery for neoplasm: Secondary | ICD-10-CM

## 2017-11-20 DIAGNOSIS — Z170 Estrogen receptor positive status [ER+]: Principal | ICD-10-CM

## 2017-11-20 NOTE — Therapy (Signed)
[place], [place], [address] Phone: [phone]   Fax:  [phone]  Physical Therapy Treatment  Patient Details  Name: Brooke [name] MRN: [id number] Date of Birth: 25-Jun-1950 Referring Provider: Dr. Autumn Messing   Encounter Date: 11/20/2017  PT End of Session - 11/20/17 1204    Visit Number  4    Number of Visits  8    Date for PT Re-Evaluation  11/23/17    PT Start Time  1107    PT Stop Time  1201    PT Time Calculation (min)  54 min    Activity Tolerance  Patient tolerated treatment well    Behavior During Therapy  [facility] for tasks assessed/performed       Past Medical History:  Diagnosis Date  . Anemia   . Anxiety   . Arthritis   . Bilateral dry eyes   . Breast cancer, left breast ([place])   . Family history of breast cancer 02/02/2017  . Family history of colon cancer 02/02/2017  . Family history of prostate cancer in father 02/02/2017  . Fibroid   . History of kidney stones   . Hyperlipidemia   . Kidney stones   . Melanoma of upper arm ([place]) 2009   RIGHT ARM   . Obesity (BMI 30.0-34.9)   . Osteopenia 09/2017   T score -1.9 max 8% / 0.8% statistically significant decline at right and left hip stable at the spine  . PONV (postoperative nausea and vomiting)   . Squamous carcinoma     of the skin  . UTI (urinary tract infection)   . Varicose veins of both lower extremities   . Vitamin D deficiency 07/2009   LOW VITAMIN D 29  . Vitamin deficiency 07/2008   VITAMIN D LOW 23    Past Surgical History:  Procedure Laterality Date  . ABDOMINAL SURGERY  1975   ovairan cyst   . APPENDECTOMY    . BREAST LUMPECTOMY WITH RADIOACTIVE SEED LOCALIZATION Right 09/25/2017   Procedure: RIGHT BREAST LUMPECTOMY WITH RADIOACTIVE SEED LOCALIZATION;  Surgeon: Jovita Kussmaul, MD;  Location: [facility];  Service: General;  Laterality: Right;  . BREAST SURGERY     bio  . CHOLECYSTECTOMY    . DERMOID CYST  EXCISION     . DILATION AND CURETTAGE OF UTERUS  1998  . GALLBLADDER SURGERY    . HYSTEROSCOPY    . insertion  port a cath  03/02/2016  . MASTECTOMY MODIFIED RADICAL Left 09/25/2017   Procedure: LEFT MODIFIED RADICAL MASTECTOMY;  Surgeon: Jovita Kussmaul, MD;  Location: [facility];  Service: General;  Laterality: Left;  . PORTACATH PLACEMENT N/A 03/02/2017   Procedure: INSERTION PORT-A-CATH;  Surgeon: Jovita Kussmaul, MD;  Location: [facility];  Service: General;  Laterality: N/A;  . SKIN BIOPSY    . SKIN CANCER EXCISION  2010,2011   2010-RIGHT ARM, 2011-LEFT LOWER LEG.-DR Rolm Bookbinder DERM. DR. Sarajane Jews IS HER DERM SURGEON.    There were no vitals filed for this visit.  Subjective Assessment - 11/20/17 1112    Subjective  My Lt forearm isn't feeling as tight as it was so that's nice. I am still pretty tight at my axilla though. My Lt scapula is doing better but I still notice the "clicking" at times.  I'm probably going to stop taking Xeloda bc it's giving me all kinds of GI issues.     Patient Stated Goals  Get my arm moving  normally.    Currently in Pain?  No/denies         [facility] PT Assessment - 11/20/17 0001      AROM   Left Shoulder Flexion  136 Degrees    Left Shoulder ABduction  153 Degrees                   [facility] Adult PT Treatment/Exercise - 11/20/17 0001      Shoulder Exercises: Standing   Flexion  AAROM;Left   In doorway teaching pt how to stretch at home during day, 3x   ABduction  AAROM;Left   In doorway to teach pt how to self stretch during day, 2x     Shoulder Exercises: Pulleys   Flexion  2 minutes    Flexion Limitations  Pt able to return correct demonstration of decreasing Lt shoulder hike    ABduction  2 minutes    ABduction Limitations  Demonstration to keep UE in full abduction      Manual Therapy   Manual Therapy  Myofascial release;Passive ROM;Soft tissue mobilization    Soft tissue mobilization  Briefly to Lt spine of scapula where pt c/o tenderness and  "clicking"    Myofascial Release  To left axillary region at area of cording and forearm at area of dimpling from cording    Passive ROM  PROM left shoulder flexion and abduction in supine to pt tolerance focused on radiation positioning all to pts tolerance with encouragement throughout to relax    Neural Stretch  --                  PT Long Term Goals - 10/26/17 1017      PT LONG TERM GOAL #1   Title  Patient will be independent with her HEP for shoulder ROM.    Time  4    Period  Weeks    Status  New      PT LONG TERM GOAL #2   Title  Patient will demonstrate left shoulder active flexion increased to >/= 150 degrees for increased ease reaching overhead.    Baseline  100 degrees    Time  4    Period  Weeks    Status  New      PT LONG TERM GOAL #3   Title  Patient will demonstrate left shoulder active abduction increased to >/= 160 degrees for increased ease obtaining radiation positioning.    Baseline  91 degrees    Time  4    Period  Weeks    Status  New      PT LONG TERM GOAL #4   Title  Patient will reduce DASH score to </= 20 to demonstrate overall increased left arm function.    Baseline  70.45    Time  4    Period  Weeks    Status  New      PT LONG TERM GOAL #5   Title  Patient will verbalize good understanding of lymphedema risk reduction practices.    Time  4    Period  Weeks    Status  New            Plan - 11/20/17 1204    Clinical Impression Statement  Continued with focus on manual therapy to Lt shoulder/axillary tightness. this has much imporved since start of care and though pt still reports mod tightness she says her forearm tightness is mostly gone. She is pleased with her current progress and  eager to see further results. Pt also attended ABC class last week and reported this was very beneficial.     Rehab Potential  Excellent    Clinical Impairments Affecting Rehab Potential  Will undergo extensive radiation    PT Frequency  2x /  week    PT Duration  4 weeks    PT Treatment/Interventions  ADLs/Self Care Home Management;Therapeutic exercise;DME Instruction;Patient/family education;Manual techniques;Passive range of motion;Scar mobilization;Manual lymph drainage    PT Next Visit Plan  Cont PROM to Lt shoulder and myofascial release to cording in Lt axilla, ROM exercises. Progress HEP    Consulted and Agree with Plan of Care  Patient       Patient will benefit from skilled therapeutic intervention in order to improve the following deficits and impairments:  Decreased skin integrity, Decreased scar mobility, Decreased knowledge of precautions, Decreased knowledge of use of DME, Increased fascial restricitons, Impaired UE functional use, Pain, Decreased range of motion, Postural dysfunction  Visit Diagnosis: Malignant neoplasm of upper-inner quadrant of left breast in female, estrogen receptor positive (HCC)  Abnormal posture  Aftercare following surgery for neoplasm  Stiffness of left shoulder, not elsewhere classified     Problem List Patient Active Problem List   Diagnosis Date Noted  . Cancer of left female breast  ([place]) 09/25/2017  . Melanoma in situ of right upper extremity ([place]) 07/18/2017  . Port-A-Cath in place 04/18/2017  . Drug-induced neutropenia ([place]) 03/09/2017  . Family history of breast cancer 02/02/2017  . Family history of colon cancer 02/02/2017  . Family history of prostate cancer in father 02/02/2017  . Malignant neoplasm of overlapping sites of left breast in female, estrogen receptor positive ([place]) 01/31/2017  . Hx of hot flashes, menopausal, HRT in the past 12/19/2011  . Osteopenia, stable, DEXA 2013, recs to repeat in 2018 12/19/2011  . Hx of Hyperlipidemia, LDL 144 in 07/2011 12/19/2011  . Vitamin D insufficiency 12/19/2011  . Fibroid     Otelia Limes, PTA 11/20/2017, 12:38 PM  [place], [place], [address] Phone: [phone]   Fax:  [phone]  Name: Brooke [name] MRN: [id number] Date of Birth: 08/08/1950

## 2017-11-21 ENCOUNTER — Ambulatory Visit
Admission: RE | Admit: 2017-11-21 | Discharge: 2017-11-21 | Disposition: A | Discharge status: Home / Self Care | Payer: Medicare Other | Source: Ambulatory Visit | Attending: Radiation Oncology | Admitting: Radiation Oncology

## 2017-11-21 DIAGNOSIS — C50812 Malignant neoplasm of overlapping sites of left female breast: Secondary | ICD-10-CM | POA: Diagnosis not present

## 2017-11-22 ENCOUNTER — Ambulatory Visit: Payer: Medicare Other

## 2017-11-22 ENCOUNTER — Ambulatory Visit
Admission: RE | Admit: 2017-11-22 | Discharge: 2017-11-22 | Disposition: A | Discharge status: Home / Self Care | Payer: Medicare Other | Source: Ambulatory Visit | Attending: Radiation Oncology | Admitting: Radiation Oncology

## 2017-11-22 ENCOUNTER — Telehealth: Payer: Self-pay

## 2017-11-22 DIAGNOSIS — C50212 Malignant neoplasm of upper-inner quadrant of left female breast: Secondary | ICD-10-CM

## 2017-11-22 DIAGNOSIS — M25612 Stiffness of left shoulder, not elsewhere classified: Secondary | ICD-10-CM

## 2017-11-22 DIAGNOSIS — C50812 Malignant neoplasm of overlapping sites of left female breast: Secondary | ICD-10-CM

## 2017-11-22 DIAGNOSIS — Z483 Aftercare following surgery for neoplasm: Secondary | ICD-10-CM

## 2017-11-22 DIAGNOSIS — Z170 Estrogen receptor positive status [ER+]: Principal | ICD-10-CM

## 2017-11-22 DIAGNOSIS — R293 Abnormal posture: Secondary | ICD-10-CM

## 2017-11-22 MED ORDER — SONAFINE EX EMUL
1.0000 | Freq: Once | CUTANEOUS | Status: AC
Start: 2017-11-22 — End: 2017-11-22
  Administered 2017-11-22: 1 via TOPICAL

## 2017-11-22 NOTE — Telephone Encounter (Signed)
Pt called to give an update about stopping Xeloda x1 week now, due to moderate-severe side effect with her GI and mouth sores. Pt did state that she has had an improvement with her mouth sores but still has diarrhea, colon pain, and intermittent rectal bleed, possibly from hemorrhoids.   Pt wanted to notify this RN that she does not want to resume Xeloda just yet until she is fully recovered. Will notify Dr.Gudena, since Dr.Magrinat is still out of office. Pt's last radonc is 10/24. Will discuss with Dr.Magrinat next week if he would like to see pt sooner. PT verbalized understanding and appreciative of the call.

## 2017-11-22 NOTE — Therapy (Signed)
[place], [place], [address] Phone: [phone]   Fax:  [phone]  Physical Therapy Treatment  Patient Details  Name: Brooke [name] MRN: [id number] Date of Birth: 02/05/1951 Referring Provider: Dr. Autumn Messing   Encounter Date: 11/22/2017  PT End of Session - 11/22/17 1435    Visit Number  5    Number of Visits  8    Date for PT Re-Evaluation  11/23/17    PT Start Time  1351    PT Stop Time  1434    PT Time Calculation (min)  43 min    Activity Tolerance  Patient tolerated treatment well    Behavior During Therapy  [facility] for tasks assessed/performed       Past Medical History:  Diagnosis Date  . Anemia   . Anxiety   . Arthritis   . Bilateral dry eyes   . Breast cancer, left breast ([place])   . Family history of breast cancer 02/02/2017  . Family history of colon cancer 02/02/2017  . Family history of prostate cancer in father 02/02/2017  . Fibroid   . History of kidney stones   . Hyperlipidemia   . Kidney stones   . Melanoma of upper arm ([place]) 2009   RIGHT ARM   . Obesity (BMI 30.0-34.9)   . Osteopenia 09/2017   T score -1.9 max 8% / 0.8% statistically significant decline at right and left hip stable at the spine  . PONV (postoperative nausea and vomiting)   . Squamous carcinoma     of the skin  . UTI (urinary tract infection)   . Varicose veins of both lower extremities   . Vitamin D deficiency 07/2009   LOW VITAMIN D 29  . Vitamin deficiency 07/2008   VITAMIN D LOW 23    Past Surgical History:  Procedure Laterality Date  . ABDOMINAL SURGERY  1975   ovairan cyst   . APPENDECTOMY    . BREAST LUMPECTOMY WITH RADIOACTIVE SEED LOCALIZATION Right 09/25/2017   Procedure: RIGHT BREAST LUMPECTOMY WITH RADIOACTIVE SEED LOCALIZATION;  Surgeon: Jovita Kussmaul, MD;  Location: [place];  Service: General;  Laterality: Right;  . BREAST SURGERY     bio  . CHOLECYSTECTOMY    . DERMOID CYST  EXCISION     . DILATION AND CURETTAGE OF UTERUS  1998  . GALLBLADDER SURGERY    . HYSTEROSCOPY    . insertion  port a cath  03/02/2016  . MASTECTOMY MODIFIED RADICAL Left 09/25/2017   Procedure: LEFT MODIFIED RADICAL MASTECTOMY;  Surgeon: Jovita Kussmaul, MD;  Location: [place];  Service: General;  Laterality: Left;  . PORTACATH PLACEMENT N/A 03/02/2017   Procedure: INSERTION PORT-A-CATH;  Surgeon: Jovita Kussmaul, MD;  Location: [place];  Service: General;  Laterality: N/A;  . SKIN BIOPSY    . SKIN CANCER EXCISION  2010,2011   2010-RIGHT ARM, 2011-LEFT LOWER LEG.-DR Rolm Bookbinder DERM. DR. Sarajane Jews IS HER DERM SURGEON.    There were no vitals filed for this visit.  Subjective Assessment - 11/22/17 1355    Subjective  Still dealing with a queasy stomach which I think is after effect from the Xeloda. My Lt shoulder is doing much better from our last visit. I'm starting to feel some tenderness at my chest wall after radiation today.    Pertinent History  Neoadjuvant chemotherapy early 2019; left mastectomy and ALND (9/13 nodes positive) on 09/25/17.    Patient Stated Goals  Get my arm moving normally.    Currently in Pain?  No/denies                       [facility] Adult PT Treatment/Exercise - 11/22/17 0001      Manual Therapy   Manual Therapy  Myofascial release;Passive ROM    Myofascial Release  To left axillary region at area of cording and UE pulling throughout P/ROM    Passive ROM  PROM left shoulder flexion and abduction in supine to pt tolerance focused on radiation positioning all to pts tolerance with encouragement throughout to relax, though pt is much better with this now and requires less cuing    Neural Stretch  To Lt UE                  PT Long Term Goals - 11/22/17 1442      PT LONG TERM GOAL #1   Title  Patient will be independent with her HEP for shoulder ROM.    Status  Partially Met      PT LONG TERM GOAL #2   Title  Patient will demonstrate left shoulder  active flexion increased to >/= 150 degrees for increased ease reaching overhead.    Baseline  100 degrees; 136 degrees - 11/20/17    Status  On-going      PT LONG TERM GOAL #3   Title  Patient will demonstrate left shoulder active abduction increased to >/= 160 degrees for increased ease obtaining radiation positioning.    Baseline  91 degrees; 153 degreees - 11/20/17    Status  On-going      PT LONG TERM GOAL #4   Title  Patient will reduce DASH score to </= 20 to demonstrate overall increased left arm function.    Status  On-going      PT LONG TERM GOAL #5   Title  Patient will verbalize good understanding of lymphedema risk reduction practices.    Baseline  Pt has attended the ABC class and verbalizes feeling well educated-11/22/17    Status  Achieved            Plan - 11/22/17 1436    Clinical Impression Statement  Pt reports feeling much improved after last session of stretching so continued focusing on this today. Her end P/ROM continues to be much improved, and was again after session today.     Rehab Potential  Excellent    Clinical Impairments Affecting Rehab Potential  Will undergo extensive radiation    PT Frequency  2x / week    PT Duration  4 weeks    PT Treatment/Interventions  ADLs/Self Care Home Management;Therapeutic exercise;DME Instruction;Patient/family education;Manual techniques;Passive range of motion;Scar mobilization;Manual lymph drainage    PT Next Visit Plan  Renewal next week. Pt considering decreasing to 1x/wk; continue focusing on increasing end ROM and myofsacial release    Consulted and Agree with Plan of Care  Patient       Patient will benefit from skilled therapeutic intervention in order to improve the following deficits and impairments:  Decreased skin integrity, Decreased scar mobility, Decreased knowledge of precautions, Decreased knowledge of use of DME, Increased fascial restricitons, Impaired UE functional use, Pain, Decreased range of  motion, Postural dysfunction  Visit Diagnosis: Malignant neoplasm of upper-inner quadrant of left breast in female, estrogen receptor positive (HCC)  Abnormal posture  Aftercare following surgery for neoplasm  Stiffness of left shoulder, not elsewhere classified  Problem List Patient Active Problem List   Diagnosis Date Noted  . Cancer of left female breast  ([place]) 09/25/2017  . Melanoma in situ of right upper extremity ([place]) 07/18/2017  . Port-A-Cath in place 04/18/2017  . Drug-induced neutropenia ([place]) 03/09/2017  . Family history of breast cancer 02/02/2017  . Family history of colon cancer 02/02/2017  . Family history of prostate cancer in father 02/02/2017  . Malignant neoplasm of overlapping sites of left breast in female, estrogen receptor positive ([place]) 01/31/2017  . Hx of hot flashes, menopausal, HRT in the past 12/19/2011  . Osteopenia, stable, DEXA 2013, recs to repeat in 2018 12/19/2011  . Hx of Hyperlipidemia, LDL 144 in 07/2011 12/19/2011  . Vitamin D insufficiency 12/19/2011  . Fibroid     Otelia Limes, PTA 11/22/2017, 2:45 PM  [place], [place], [address] Phone: [phone]   Fax:  [phone]  Name: TINEKA URIEGAS MRN: [id number] Date of Birth: [birth year]

## 2017-11-23 ENCOUNTER — Ambulatory Visit
Admission: RE | Admit: 2017-11-23 | Discharge: 2017-11-23 | Disposition: A | Discharge status: Home / Self Care | Payer: Medicare Other | Source: Ambulatory Visit | Attending: Radiation Oncology | Admitting: Radiation Oncology

## 2017-11-23 DIAGNOSIS — C50812 Malignant neoplasm of overlapping sites of left female breast: Secondary | ICD-10-CM | POA: Diagnosis not present

## 2017-11-24 ENCOUNTER — Ambulatory Visit
Admission: RE | Admit: 2017-11-24 | Discharge: 2017-11-24 | Disposition: A | Discharge status: Home / Self Care | Payer: Medicare Other | Source: Ambulatory Visit | Attending: Radiation Oncology | Admitting: Radiation Oncology

## 2017-11-24 DIAGNOSIS — C50812 Malignant neoplasm of overlapping sites of left female breast: Secondary | ICD-10-CM | POA: Diagnosis not present

## 2017-11-27 ENCOUNTER — Ambulatory Visit
Admission: RE | Admit: 2017-11-27 | Discharge: 2017-11-27 | Disposition: A | Discharge status: Home / Self Care | Payer: Medicare Other | Source: Ambulatory Visit | Attending: Radiation Oncology | Admitting: Radiation Oncology

## 2017-11-27 ENCOUNTER — Inpatient Hospital Stay: Payer: Medicare Other

## 2017-11-27 ENCOUNTER — Inpatient Hospital Stay: Payer: Medicare Other | Attending: Oncology | Admitting: Genetic Counselor

## 2017-11-27 DIAGNOSIS — Z8042 Family history of malignant neoplasm of prostate: Secondary | ICD-10-CM | POA: Diagnosis not present

## 2017-11-27 DIAGNOSIS — Z803 Family history of malignant neoplasm of breast: Secondary | ICD-10-CM | POA: Diagnosis not present

## 2017-11-27 DIAGNOSIS — C50812 Malignant neoplasm of overlapping sites of left female breast: Secondary | ICD-10-CM

## 2017-11-27 DIAGNOSIS — Z8049 Family history of malignant neoplasm of other genital organs: Secondary | ICD-10-CM | POA: Diagnosis not present

## 2017-11-27 DIAGNOSIS — Z170 Estrogen receptor positive status [ER+]: Secondary | ICD-10-CM

## 2017-11-27 DIAGNOSIS — Z800 Family history of malignant neoplasm of digestive organs: Secondary | ICD-10-CM

## 2017-11-28 ENCOUNTER — Encounter: Payer: Self-pay | Admitting: Genetic Counselor

## 2017-11-28 ENCOUNTER — Ambulatory Visit
Admission: RE | Admit: 2017-11-28 | Discharge: 2017-11-28 | Disposition: A | Discharge status: Home / Self Care | Payer: Medicare Other | Source: Ambulatory Visit | Attending: Radiation Oncology | Admitting: Radiation Oncology

## 2017-11-28 ENCOUNTER — Ambulatory Visit: Payer: Medicare Other | Attending: General Surgery

## 2017-11-28 DIAGNOSIS — Z170 Estrogen receptor positive status [ER+]: Secondary | ICD-10-CM | POA: Diagnosis not present

## 2017-11-28 DIAGNOSIS — C50812 Malignant neoplasm of overlapping sites of left female breast: Secondary | ICD-10-CM | POA: Diagnosis present

## 2017-11-28 DIAGNOSIS — Z483 Aftercare following surgery for neoplasm: Secondary | ICD-10-CM | POA: Insufficient documentation

## 2017-11-28 DIAGNOSIS — Z8042 Family history of malignant neoplasm of prostate: Secondary | ICD-10-CM | POA: Insufficient documentation

## 2017-11-28 DIAGNOSIS — R293 Abnormal posture: Secondary | ICD-10-CM | POA: Insufficient documentation

## 2017-11-28 DIAGNOSIS — Z8049 Family history of malignant neoplasm of other genital organs: Secondary | ICD-10-CM | POA: Insufficient documentation

## 2017-11-28 DIAGNOSIS — C50212 Malignant neoplasm of upper-inner quadrant of left female breast: Secondary | ICD-10-CM | POA: Insufficient documentation

## 2017-11-28 DIAGNOSIS — Z510 Encounter for antineoplastic radiation therapy: Secondary | ICD-10-CM | POA: Diagnosis not present

## 2017-11-28 DIAGNOSIS — M25612 Stiffness of left shoulder, not elsewhere classified: Secondary | ICD-10-CM | POA: Insufficient documentation

## 2017-11-28 NOTE — Progress Notes (Signed)
REFERRING PROVIDER: Chauncey Cruel, MD [place], [place] [address]  PRIMARY PROVIDER:  [name], DO  PRIMARY REASON FOR VISIT:  1. Family history of colon cancer   2. Family history of uterine cancer   3. Family history of prostate cancer   4. Family history of breast cancer   5. Malignant neoplasm of overlapping sites of left breast in female, estrogen receptor positive ([place])      HISTORY OF PRESENT ILLNESS:   Ms. Shaub, a 67 y.o. female, was seen for a [place] cancer genetics consultation at the request of Dr. [name] due to a personal and family history of cancer.  Ms. Brugh presents to clinic today to discuss the possibility of a hereditary predisposition to cancer, genetic testing, and to further clarify her future cancer risks, as well as potential cancer risks for family members.   In 2015, at the age of 59, Ms. Bert was diagnosed with malignant melanoma insitu of the left posterior leg. This was treated with mose surgery.  She had been diagnosed with several melanocytic nevus and dysplastic nevi prior to this diagnosis.  In November 2018, at the age of 33, Ms. Stoffers was diagnosed with cancer of the breast.  This ws treated with chemotherapy, radiation and mastectomy.   CANCER HISTORY:    Malignant neoplasm of overlapping sites of left breast in female, estrogen receptor positive ([place])   01/31/2017 Initial Diagnosis    Malignant neoplasm of overlapping sites of left breast in female, estrogen receptor positive ([place])    10/11/2017 Cancer Staging    Staging form: Breast, AJCC 8th Edition - Pathologic: No Stage Recommended (ypT3, pN2a, cM0, G2, ER+, PR+, HER2-) - Signed by Gardenia Phlegm, NP on 10/11/2017      HORMONAL RISK FACTORS:  Menarche was at age 68.  First live birth at age N/A.  OCP use for approximately 0 years.  Ovaries intact: one ovary intact, partial second ovary due to removal of a desmoid tumor.   Hysterectomy: no.  Menopausal status: postmenopausal.  HRT use: 5 years. Colonoscopy: no; not examined. Mammogram within the last year: yes. Number of breast biopsies: 1. Up to date with pelvic exams:  no. Any excessive radiation exposure in the past:  no  Past Medical History:  Diagnosis Date  . Anemia   . Anxiety   . Arthritis   . Bilateral dry eyes   . Breast cancer, left breast ([place])   . Family history of breast cancer 02/02/2017  . Family history of breast cancer   . Family history of colon cancer 02/02/2017  . Family history of colon cancer   . Family history of prostate cancer   . Family history of prostate cancer in father 02/02/2017  . Family history of uterine cancer   . Fibroid   . History of kidney stones   . Hyperlipidemia   . Kidney stones   . Melanoma of upper arm ([place]) 2009   RIGHT ARM   . Obesity (BMI 30.0-34.9)   . Osteopenia 09/2017   T score -1.9 max 8% / 0.8% statistically significant decline at right and left hip stable at the spine  . PONV (postoperative nausea and vomiting)   . Squamous carcinoma     of the skin  . UTI (urinary tract infection)   . Varicose veins of both lower extremities   . Vitamin D deficiency 07/2009   LOW VITAMIN D 29  . Vitamin deficiency 07/2008   VITAMIN  D LOW 23    Past Surgical History:  Procedure Laterality Date  . ABDOMINAL SURGERY  1975   ovairan cyst   . APPENDECTOMY    . BREAST LUMPECTOMY WITH RADIOACTIVE SEED LOCALIZATION Right 09/25/2017   Procedure: RIGHT BREAST LUMPECTOMY WITH RADIOACTIVE SEED LOCALIZATION;  Surgeon: Jovita Kussmaul, MD;  Location: [place];  Service: General;  Laterality: Right;  . BREAST SURGERY     bio  . CHOLECYSTECTOMY    . DERMOID CYST  EXCISION    . DILATION AND CURETTAGE OF UTERUS  1998  . GALLBLADDER SURGERY    . HYSTEROSCOPY    . insertion  port a cath  03/02/2016  . MASTECTOMY MODIFIED RADICAL Left 09/25/2017   Procedure: LEFT MODIFIED RADICAL MASTECTOMY;  Surgeon: Jovita Kussmaul,  MD;  Location: [place];  Service: General;  Laterality: Left;  . PORTACATH PLACEMENT N/A 03/02/2017   Procedure: INSERTION PORT-A-CATH;  Surgeon: Jovita Kussmaul, MD;  Location: [place];  Service: General;  Laterality: N/A;  . SKIN BIOPSY    . SKIN CANCER EXCISION  2010,2011   2010-RIGHT ARM, 2011-LEFT LOWER LEG.-DR Rolm Bookbinder DERM. DR. Sarajane Jews IS HER DERM SURGEON.    Social History   Socioeconomic History  . Marital status: Single    Spouse name: Not on file  . Number of children: Not on file  . Years of education: Not on file  . Highest education level: Not on file  Occupational History  . Not on file  Social Needs  . Financial resource strain: Not on file  . Food insecurity:    Worry: Not on file    Inability: Not on file  . Transportation needs:    Medical: Not on file    Non-medical: Not on file  Tobacco Use  . Smoking status: Never Smoker  . Smokeless tobacco: Never Used  Substance and Sexual Activity  . Alcohol use: No    Frequency: Never    Comment: rare  . Drug use: No  . Sexual activity: Never    Birth control/protection: Post-menopausal  Lifestyle  . Physical activity:    Days per week: Not on file    Minutes per session: Not on file  . Stress: Not on file  Relationships  . Social connections:    Talks on phone: Not on file    Gets together: Not on file    Attends religious service: Not on file    Active member of club or organization: Not on file    Attends meetings of clubs or organizations: Not on file    Relationship status: Not on file  Other Topics Concern  . Not on file  Social History Narrative  . Not on file     FAMILY HISTORY:  We obtained a detailed, 4-generation family history.  Significant diagnoses are listed below: Family History  Problem Relation Age of Onset  . Diabetes Father   . Hypertension Father   . Heart disease Father   . Prostate cancer Father 51  . Breast cancer Sister 8       again at age 69, GT results unk  . Skin  cancer Sister   . Colon cancer Paternal Grandfather 16  . Hypertension Mother   . Heart failure Mother   . Uterine cancer Sister 49       stage IV  . Breast cancer Cousin        mat first cousin dx under 67    The patient has no  children.  She has three sisters.  One sister developed breast cancer at age 79 and again at 58.  She reportedly had genetic testing around 2010, that is reportedly negative.  A second sister was recently diagnosed with stage IV uterine cancer.  Both parents are deceased.  The patient's mother died at 18.  She had a multiple siblings, who did not have cancer.  One sister had a daughter with breast cancer under 18.  The maternal grandparents are deceased from non cancer related issues.  The patient's father died at 67.  He was diagnosed with prostate cancer at 45.  He had two brothers, one died young in a house fire and the seond did not have cancer.  The paternal grandfather died of colon cancer.  Ms. Husted is aware of previous family history of genetic testing for hereditary cancer risks. Patient's maternal ancestors are of [place] and [place] descent, and paternal ancestors are of [place] and [place] European descent. There is no reported Ashkenazi Jewish ancestry. There is no known consanguinity.  GENETIC COUNSELING ASSESSMENT: MY RINKE is a 67 y.o. female with a personal and family history of cancer which is somewhat suggestive of a hereditary cancer syndrome and predisposition to cancer. We, therefore, discussed and recommended the following at today's visit.   DISCUSSION: We discussed that about 5-10% of women with breast cancer is due to hereditary causes, most commonly BRCA mutations.  Her sister reportedly tested negative for BRCA mutations around 2010.  We discussed that testing has improved both in the quantity of testing as well as identifying mutations missed in the past and therefore her testing should probably be updated.  However, the most likely  scenario is that she does not have a BRCA mutation. There are other genes that can increase there risk for breast or other cancers.  We reviewed Lynch syndrome as we can see colon, uterine, prostate and breast cancer in that condition as well.  We reviewed the characteristics, features and inheritance patterns of hereditary cancer syndromes. We also discussed genetic testing, including the appropriate family members to test, the process of testing, insurance coverage and turn-around-time for results. We discussed the implications of a negative, positive and/or variant of uncertain significant result. We recommended Ms. Moser pursue genetic testing for the multicancer gene panel. The Multi-Gene Panel offered by [facility] includes sequencing and/or deletion duplication testing of the following 84 genes: AIP, ALK, APC, ATM, AXIN2,BAP1,  BARD1, BLM, BMPR1A, BRCA1, BRCA2, BRIP1, CASR, CDC73, CDH1, CDK4, CDKN1B, CDKN1C, CDKN2A (p14ARF), CDKN2A (p16INK4a), CEBPA, CHEK2, CTNNA1, DICER1, DIS3L2, EGFR (c.2369C>T, p.Thr790Met variant only), EPCAM (Deletion/duplication testing only), FH, FLCN, GATA2, GPC3, GREM1 (Promoter region deletion/duplication testing only), HOXB13 (c.251G>A, p.Gly84Glu), HRAS, KIT, MAX, MEN1, MET, MITF (c.952G>A, p.Glu318Lys variant only), MLH1, MSH2, MSH3, MSH6, MUTYH, NBN, NF1, NF2, NTHL1, PALB2, PDGFRA, PHOX2B, PMS2, POLD1, POLE, POT1, PRKAR1A, PTCH1, PTEN, RAD50, RAD51C, RAD51D, RB1, RECQL4, RET, RUNX1, SDHAF2, SDHA (sequence changes only), SDHB, SDHC, SDHD, SMAD4, SMARCA4, SMARCB1, SMARCE1, STK11, SUFU, TERC, TERT, TMEM127, TP53, TSC1, TSC2, VHL, WRN and WT1.    Based on Ms. Cura's personal and family history of cancer, she meets medical criteria for genetic testing. Despite that she meets criteria, she may still have an out of pocket cost. We discussed that if her out of pocket cost for testing is over $100, the laboratory will call and confirm whether she wants to proceed with testing.  If  the out of pocket cost of testing is less than $100 she will be billed  by the genetic testing laboratory.   PLAN: After considering the risks, benefits, and limitations, Ms. Spallone  provided informed consent to pursue genetic testing and the blood sample was sent to [facility] for analysis of the Multicancer. Results should be available within approximately 2-3 weeks' time, at which point they will be disclosed by telephone to Ms. Merendino, as will any additional recommendations warranted by these results. Ms. Wolfley will receive a summary of her genetic counseling visit and a copy of her results once available. This information will also be available in Epic. We encouraged Ms. Kuiken to remain in contact with cancer genetics annually so that we can continuously update the family history and inform her of any changes in cancer genetics and testing that may be of benefit for her family. Ms. Rettke questions were answered to her satisfaction today. Our contact information was provided should additional questions or concerns arise.  Lastly, we encouraged Ms. Belmonte to remain in contact with cancer genetics annually so that we can continuously update the family history and inform her of any changes in cancer genetics and testing that may be of benefit for this family.   Ms.  Dulski questions were answered to her satisfaction today. Our contact information was provided should additional questions or concerns arise. Thank you for the referral and allowing [place] to share in the care of your patient.   Erskine Steinfeldt P. Florene Glen, [place], [facility] Certified Genetic Counselor [email or web address] phone: [phone]  The patient was seen for a total of 60 minutes in face-to-face genetic counseling.  This patient was discussed with Drs. Magrinat, Lindi Adie and/or Burr Medico who agrees with the above.    _______________________________________________________________________ For Office Staff:  Number of people  involved in session: 1 Was an Intern/ student involved with case: yes: [employer]

## 2017-11-29 ENCOUNTER — Ambulatory Visit
Admission: RE | Admit: 2017-11-29 | Discharge: 2017-11-29 | Disposition: A | Discharge status: Home / Self Care | Payer: Medicare Other | Source: Ambulatory Visit | Attending: Radiation Oncology | Admitting: Radiation Oncology

## 2017-11-29 DIAGNOSIS — C50812 Malignant neoplasm of overlapping sites of left female breast: Secondary | ICD-10-CM | POA: Diagnosis not present

## 2017-11-30 ENCOUNTER — Ambulatory Visit
Admission: RE | Admit: 2017-11-30 | Discharge: 2017-11-30 | Disposition: A | Discharge status: Home / Self Care | Payer: Medicare Other | Source: Ambulatory Visit | Attending: Radiation Oncology | Admitting: Radiation Oncology

## 2017-11-30 DIAGNOSIS — C50812 Malignant neoplasm of overlapping sites of left female breast: Secondary | ICD-10-CM | POA: Diagnosis not present

## 2017-12-01 ENCOUNTER — Ambulatory Visit
Admission: RE | Admit: 2017-12-01 | Discharge: 2017-12-01 | Disposition: A | Discharge status: Home / Self Care | Payer: Medicare Other | Source: Ambulatory Visit | Attending: Radiation Oncology | Admitting: Radiation Oncology

## 2017-12-01 DIAGNOSIS — C50812 Malignant neoplasm of overlapping sites of left female breast: Secondary | ICD-10-CM | POA: Diagnosis not present

## 2017-12-01 DIAGNOSIS — C50012 Malignant neoplasm of nipple and areola, left female breast: Secondary | ICD-10-CM

## 2017-12-01 DIAGNOSIS — Z170 Estrogen receptor positive status [ER+]: Principal | ICD-10-CM

## 2017-12-01 MED ORDER — SONAFINE EX EMUL
1.0000 "application " | Freq: Two times a day (BID) | CUTANEOUS | Status: DC
  Administered 2017-12-01: 1 via TOPICAL

## 2017-12-04 ENCOUNTER — Ambulatory Visit
Admission: RE | Admit: 2017-12-04 | Discharge: 2017-12-04 | Disposition: A | Discharge status: Home / Self Care | Payer: Medicare Other | Source: Ambulatory Visit | Attending: Radiation Oncology | Admitting: Radiation Oncology

## 2017-12-04 DIAGNOSIS — C50812 Malignant neoplasm of overlapping sites of left female breast: Secondary | ICD-10-CM | POA: Diagnosis not present

## 2017-12-05 ENCOUNTER — Ambulatory Visit: Payer: Medicare Other

## 2017-12-05 ENCOUNTER — Ambulatory Visit
Admission: RE | Admit: 2017-12-05 | Discharge: 2017-12-05 | Disposition: A | Discharge status: Home / Self Care | Payer: Medicare Other | Source: Ambulatory Visit | Attending: Radiation Oncology | Admitting: Radiation Oncology

## 2017-12-05 DIAGNOSIS — Z170 Estrogen receptor positive status [ER+]: Secondary | ICD-10-CM | POA: Diagnosis present

## 2017-12-05 DIAGNOSIS — M25612 Stiffness of left shoulder, not elsewhere classified: Secondary | ICD-10-CM | POA: Diagnosis present

## 2017-12-05 DIAGNOSIS — Z483 Aftercare following surgery for neoplasm: Secondary | ICD-10-CM | POA: Diagnosis present

## 2017-12-05 DIAGNOSIS — R293 Abnormal posture: Secondary | ICD-10-CM

## 2017-12-05 DIAGNOSIS — C50212 Malignant neoplasm of upper-inner quadrant of left female breast: Secondary | ICD-10-CM

## 2017-12-05 DIAGNOSIS — C50812 Malignant neoplasm of overlapping sites of left female breast: Secondary | ICD-10-CM | POA: Diagnosis not present

## 2017-12-05 NOTE — Addendum Note (Signed)
Addended by: Arbutus Ped C on: 12/05/2017 01:58 PM   Modules accepted: Orders

## 2017-12-05 NOTE — Therapy (Signed)
[place], [place], [address] Phone: [phone]   Fax:  [phone]  Physical Therapy Treatment  Patient Details  Name: Brooke [name] MRN: [id number] Date of Birth: May 18, 1950 Referring Provider (PT): Dr. Autumn Messing   Encounter Date: 12/05/2017  PT End of Session - 12/05/17 1142    Visit Number  6    Number of Visits  20    Date for PT Re-Evaluation  01/16/18    PT Start Time  1048    PT Stop Time  1137    PT Time Calculation (min)  49 min    Activity Tolerance  Patient limited by pain    Behavior During Therapy  [facility] for tasks assessed/performed       Past Medical History:  Diagnosis Date  . Anemia   . Anxiety   . Arthritis   . Bilateral dry eyes   . Breast cancer, left breast ([place])   . Family history of breast cancer 02/02/2017  . Family history of breast cancer   . Family history of colon cancer 02/02/2017  . Family history of colon cancer   . Family history of prostate cancer   . Family history of prostate cancer in father 02/02/2017  . Family history of uterine cancer   . Fibroid   . History of kidney stones   . Hyperlipidemia   . Kidney stones   . Melanoma of upper arm ([place]) 2009   RIGHT ARM   . Obesity (BMI 30.0-34.9)   . Osteopenia 09/2017   T score -1.9 max 8% / 0.8% statistically significant decline at right and left hip stable at the spine  . PONV (postoperative nausea and vomiting)   . Squamous carcinoma     of the skin  . UTI (urinary tract infection)   . Varicose veins of both lower extremities   . Vitamin D deficiency 07/2009   LOW VITAMIN D 29  . Vitamin deficiency 07/2008   VITAMIN D LOW 23    Past Surgical History:  Procedure Laterality Date  . ABDOMINAL SURGERY  1975   ovairan cyst   . APPENDECTOMY    . BREAST LUMPECTOMY WITH RADIOACTIVE SEED LOCALIZATION Right 09/25/2017   Procedure: RIGHT BREAST LUMPECTOMY WITH RADIOACTIVE SEED LOCALIZATION;  Surgeon: Jovita Kussmaul, MD;  Location: [place];  Service: General;  Laterality: Right;  . BREAST SURGERY     bio  . CHOLECYSTECTOMY    . DERMOID CYST  EXCISION    . DILATION AND CURETTAGE OF UTERUS  1998  . GALLBLADDER SURGERY    . HYSTEROSCOPY    . insertion  port a cath  03/02/2016  . MASTECTOMY MODIFIED RADICAL Left 09/25/2017   Procedure: LEFT MODIFIED RADICAL MASTECTOMY;  Surgeon: Jovita Kussmaul, MD;  Location: [place];  Service: General;  Laterality: Left;  . PORTACATH PLACEMENT N/A 03/02/2017   Procedure: INSERTION PORT-A-CATH;  Surgeon: Jovita Kussmaul, MD;  Location: [place];  Service: General;  Laterality: N/A;  . SKIN BIOPSY    . SKIN CANCER EXCISION  2010,2011   2010-RIGHT ARM, 2011-LEFT LOWER LEG.-DR Rolm Bookbinder DERM. DR. Sarajane Jews IS HER DERM SURGEON.    There were no vitals filed for this visit.  Subjective Assessment - 12/05/17 1051    Subjective  My skin is starting to get red and sensitive but I'm more than half way done now, only 12 more treatments left. I would like to continue PT, but just 1x/wk for  now due to radiation.    Pertinent History  Neoadjuvant chemotherapy early 2019; left mastectomy and ALND (9/13 nodes positive) on 09/25/17.    Patient Stated Goals  Get my arm moving normally.    Currently in Pain?  No/denies               Katina Dung - 12/05/17 0001    Open a tight or new jar  Moderate difficulty    Do heavy household chores (wash walls, wash floors)  Moderate difficulty    Carry a shopping bag or briefcase  Mild difficulty    Wash your back  Moderate difficulty    Use a knife to cut food  No difficulty    Recreational activities in which you take some force or impact through your arm, shoulder, or hand (golf, hammering, tennis)  Moderate difficulty    During the past week, to what extent has your arm, shoulder or hand problem interfered with your normal social activities with family, friends, neighbors, or groups?  Modererately    During the past week, to what  extent has your arm, shoulder or hand problem limited your work or other regular daily activities  Modererately    Arm, shoulder, or hand pain.  Moderate    Tingling (pins and needles) in your arm, shoulder, or hand  Moderate    Difficulty Sleeping  Moderate difficulty    DASH Score  43.18 %             [facility] Adult PT Treatment/Exercise - 12/05/17 0001      Self-Care   Self-Care  Other Self-Care Comments    Other Self-Care Comments   Spent time at beginning of session reviewing pts goals and addressing her questions regarding radiation and skin/tissue changes and how this can affect her coming to PT. Instructed pt we often will put a pt on hold at end of radiation when, like her, they start having skin changes/tissue tightness that limit ROM. She verbalized understanding and is agreeable to be placed on hold if it comes to that.       Manual Therapy   Myofascial Release  To left axillary region at area of cording and UE being mindful of irradiated tissue pulling throughout P/ROM    Passive ROM  PROM left shoulder flexion and abduction in supine to pt tolerance focused on radiation positioning all to pts tolerance with encouragement throughout to relax, though pt is much better with this now and requires less cuing    Neural Stretch  --                  PT Long Term Goals - 12/05/17 1112      PT LONG TERM GOAL #1   Title  Patient will be independent with her HEP for shoulder ROM.    Status  Partially Met      PT LONG TERM GOAL #2   Title  Patient will demonstrate left shoulder active flexion increased to >/= 150 degrees for increased ease reaching overhead.    Baseline  100 degrees; 136 degrees - 11/20/17; 134 degrees- 12/05/17    Status  On-going      PT LONG TERM GOAL #3   Title  Patient will demonstrate left shoulder active abduction increased to >/= 160 degrees for increased ease obtaining radiation positioning.    Baseline  91 degrees; 153 degreees - 11/20/17; 133  degrees-12/05/17 (pt reports noting tissue tightness from radiation now)    Status  On-going      PT LONG TERM GOAL #4   Title  Patient will reduce DASH score to </= 20 to demonstrate overall increased left arm function.    Baseline  70.45; 43.18 - 12/05/17    Status  Partially Met      PT LONG TERM GOAL #5   Title  Patient will verbalize good understanding of lymphedema risk reduction practices.    Baseline  Pt has attended the ABC class and verbalizes feeling well educated-11/22/17    Status  Achieved            Plan - 12/05/17 1143    Clinical Impression Statement  Pt is on her 21/33 radiation treatments and her skin appears to be showing signs of radiation dermatitis and she was limited at her end ROM today due to reports of tissue tightness at Lt lateral trunk and upper aspect of shoulder where she reports they are targeting radiation. Instructed pt that we may need to put her on hold after next week until after radiation due to radiation and she verbalized understanding this. Pt would like to continue therapy as she has lost 20 degrees of abduction and reports noticing her ROM is decreasing despite her continued stretching at home. Also she is only able to come 1x/wk for now during radiation but would like to resume 2x/wk after radiation when her schedule is more open. Pt will benefit from continuing PT to address ROM deficits and when able again myofascial tightness that limits her end ROM. Cording also some present at axilla though this is much improved from start of care.     Clinical Impairments Affecting Rehab Potential  Will undergo extensive radiation    PT Frequency  2x / week   1x/wk during radiation   PT Duration  6 weeks    PT Treatment/Interventions  ADLs/Self Care Home Management;Therapeutic exercise;DME Instruction;Patient/family education;Manual techniques;Passive range of motion;Scar mobilization;Manual lymph drainage    PT Next Visit Plan  Renewal this visit. 1x/wk  during radiation then resume 2x/wk prn after. Continue focusing on end ROM stretching being mindful of irradiated skin/tissue. Pt may need to be put on hold after next week to alow for completing radiation and healing.     Consulted and Agree with Plan of Care  Patient       Patient will benefit from skilled therapeutic intervention in order to improve the following deficits and impairments:  Decreased skin integrity, Decreased scar mobility, Decreased knowledge of precautions, Decreased knowledge of use of DME, Increased fascial restricitons, Impaired UE functional use, Pain, Decreased range of motion, Postural dysfunction  Visit Diagnosis: Malignant neoplasm of upper-inner quadrant of left breast in female, estrogen receptor positive (HCC)  Abnormal posture  Aftercare following surgery for neoplasm  Stiffness of left shoulder, not elsewhere classified     Problem List Patient Active Problem List   Diagnosis Date Noted  . Family history of uterine cancer   . Family history of prostate cancer   . Cancer of left female breast  ([place]) 09/25/2017  . Melanoma in situ of right upper extremity ([place]) 07/18/2017  . Port-A-Cath in place 04/18/2017  . Drug-induced neutropenia ([place]) 03/09/2017  . Family history of breast cancer 02/02/2017  . Family history of colon cancer 02/02/2017  . Family history of prostate cancer in father 02/02/2017  . Malignant neoplasm of overlapping sites of left breast in female, estrogen receptor positive ([place]) 01/31/2017  . Hx of hot flashes, menopausal, HRT in the past 12/19/2011  .  Osteopenia, stable, DEXA 2013, recs to repeat in 2018 12/19/2011  . Hx of Hyperlipidemia, LDL 144 in 07/2011 12/19/2011  . Vitamin D insufficiency 12/19/2011  . Fibroid     Otelia Limes, PTA 12/05/2017, 11:54 AM  [place], [place], [address] Phone: [phone]   Fax:   [phone]  Name: Brooke [name] MRN: [id number] Date of Birth: Dec 22, 1950

## 2017-12-06 ENCOUNTER — Ambulatory Visit
Admission: RE | Admit: 2017-12-06 | Discharge: 2017-12-06 | Disposition: A | Discharge status: Home / Self Care | Payer: Medicare Other | Source: Ambulatory Visit | Attending: Radiation Oncology | Admitting: Radiation Oncology

## 2017-12-06 DIAGNOSIS — C50812 Malignant neoplasm of overlapping sites of left female breast: Secondary | ICD-10-CM | POA: Diagnosis not present

## 2017-12-07 ENCOUNTER — Ambulatory Visit
Admission: RE | Admit: 2017-12-07 | Discharge: 2017-12-07 | Disposition: A | Discharge status: Home / Self Care | Payer: Medicare Other | Source: Ambulatory Visit | Attending: Radiation Oncology | Admitting: Radiation Oncology

## 2017-12-07 DIAGNOSIS — C50812 Malignant neoplasm of overlapping sites of left female breast: Secondary | ICD-10-CM | POA: Diagnosis not present

## 2017-12-08 ENCOUNTER — Ambulatory Visit
Admission: RE | Admit: 2017-12-08 | Discharge: 2017-12-08 | Disposition: A | Discharge status: Home / Self Care | Payer: Medicare Other | Source: Ambulatory Visit | Attending: Radiation Oncology | Admitting: Radiation Oncology

## 2017-12-08 ENCOUNTER — Ambulatory Visit: Payer: Medicare Other | Admitting: Radiation Oncology

## 2017-12-08 DIAGNOSIS — C50812 Malignant neoplasm of overlapping sites of left female breast: Secondary | ICD-10-CM | POA: Diagnosis not present

## 2017-12-11 ENCOUNTER — Ambulatory Visit
Admission: RE | Admit: 2017-12-11 | Discharge: 2017-12-11 | Disposition: A | Discharge status: Home / Self Care | Payer: Medicare Other | Source: Ambulatory Visit | Attending: Radiation Oncology | Admitting: Radiation Oncology

## 2017-12-11 DIAGNOSIS — Z170 Estrogen receptor positive status [ER+]: Principal | ICD-10-CM

## 2017-12-11 DIAGNOSIS — C50812 Malignant neoplasm of overlapping sites of left female breast: Secondary | ICD-10-CM | POA: Diagnosis not present

## 2017-12-11 MED ORDER — SONAFINE EX EMUL
1.0000 "application " | Freq: Once | CUTANEOUS | Status: AC
  Administered 2017-12-11: 1 via TOPICAL

## 2017-12-12 ENCOUNTER — Ambulatory Visit: Payer: Medicare Other

## 2017-12-12 ENCOUNTER — Ambulatory Visit
Admission: RE | Admit: 2017-12-12 | Discharge: 2017-12-12 | Disposition: A | Discharge status: Home / Self Care | Payer: Medicare Other | Source: Ambulatory Visit | Attending: Radiation Oncology | Admitting: Radiation Oncology

## 2017-12-12 DIAGNOSIS — Z483 Aftercare following surgery for neoplasm: Secondary | ICD-10-CM

## 2017-12-12 DIAGNOSIS — R293 Abnormal posture: Secondary | ICD-10-CM

## 2017-12-12 DIAGNOSIS — Z170 Estrogen receptor positive status [ER+]: Principal | ICD-10-CM

## 2017-12-12 DIAGNOSIS — C50812 Malignant neoplasm of overlapping sites of left female breast: Secondary | ICD-10-CM | POA: Diagnosis not present

## 2017-12-12 DIAGNOSIS — C50212 Malignant neoplasm of upper-inner quadrant of left female breast: Secondary | ICD-10-CM

## 2017-12-12 DIAGNOSIS — M25612 Stiffness of left shoulder, not elsewhere classified: Secondary | ICD-10-CM

## 2017-12-12 NOTE — Therapy (Signed)
[place], [place], [address] Phone: [phone]   Fax:  [phone]  Physical Therapy Treatment  Patient Details  Name: Brooke [name] MRN: [id number] Date of Birth: Aug 15, 1950 Referring Provider (PT): Dr. Autumn Messing   Encounter Date: 12/12/2017  PT End of Session - 12/12/17 1202    Visit Number  7    Number of Visits  20    Date for PT Re-Evaluation  01/16/18    PT Start Time  1107    PT Stop Time  1152    PT Time Calculation (min)  45 min    Activity Tolerance  Patient tolerated treatment well    Behavior During Therapy  [facility] for tasks assessed/performed       Past Medical History:  Diagnosis Date  . Anemia   . Anxiety   . Arthritis   . Bilateral dry eyes   . Breast cancer, left breast ([place])   . Family history of breast cancer 02/02/2017  . Family history of breast cancer   . Family history of colon cancer 02/02/2017  . Family history of colon cancer   . Family history of prostate cancer   . Family history of prostate cancer in father 02/02/2017  . Family history of uterine cancer   . Fibroid   . History of kidney stones   . Hyperlipidemia   . Kidney stones   . Melanoma of upper arm ([place]) 2009   RIGHT ARM   . Obesity (BMI 30.0-34.9)   . Osteopenia 09/2017   T score -1.9 max 8% / 0.8% statistically significant decline at right and left hip stable at the spine  . PONV (postoperative nausea and vomiting)   . Squamous carcinoma     of the skin  . UTI (urinary tract infection)   . Varicose veins of both lower extremities   . Vitamin D deficiency 07/2009   LOW VITAMIN D 29  . Vitamin deficiency 07/2008   VITAMIN D LOW 23    Past Surgical History:  Procedure Laterality Date  . ABDOMINAL SURGERY  1975   ovairan cyst   . APPENDECTOMY    . BREAST LUMPECTOMY WITH RADIOACTIVE SEED LOCALIZATION Right 09/25/2017   Procedure: RIGHT BREAST LUMPECTOMY WITH RADIOACTIVE SEED LOCALIZATION;   Surgeon: Jovita Kussmaul, MD;  Location: [place];  Service: General;  Laterality: Right;  . BREAST SURGERY     bio  . CHOLECYSTECTOMY    . DERMOID CYST  EXCISION    . DILATION AND CURETTAGE OF UTERUS  1998  . GALLBLADDER SURGERY    . HYSTEROSCOPY    . insertion  port a cath  03/02/2016  . MASTECTOMY MODIFIED RADICAL Left 09/25/2017   Procedure: LEFT MODIFIED RADICAL MASTECTOMY;  Surgeon: Jovita Kussmaul, MD;  Location: [facility];  Service: General;  Laterality: Left;  . PORTACATH PLACEMENT N/A 03/02/2017   Procedure: INSERTION PORT-A-CATH;  Surgeon: Jovita Kussmaul, MD;  Location: [place];  Service: General;  Laterality: N/A;  . SKIN BIOPSY    . SKIN CANCER EXCISION  2010,2011   2010-RIGHT ARM, 2011-LEFT LOWER LEG.-DR Rolm Bookbinder DERM. DR. Sarajane Jews IS HER DERM SURGEON.    There were no vitals filed for this visit.  Subjective Assessment - 12/12/17 1110    Subjective  My ROM I think could be better in my Rt shoulder but it's ok. I have seen the dimpling from my cording in my upper arm at times, but just seem to  come and go.     Pertinent History  Neoadjuvant chemotherapy early 2019; left mastectomy and ALND (9/13 nodes positive) on 09/25/17.    Patient Stated Goals  Get my arm moving normally.    Currently in Pain?  No/denies         [facility] PT Assessment - 12/12/17 0001      AROM   Left Shoulder Flexion  139 Degrees    Left Shoulder ABduction  144 Degrees                   [facility] Adult PT Treatment/Exercise - 12/12/17 0001      Shoulder Exercises: Pulleys   Flexion  2 minutes    Flexion Limitations  Pt able to return good demo of correct technique with this today    ABduction  2 minutes      Shoulder Exercises: Therapy Ball   Flexion  10 reps;Limitations   Forward lean into end of stretch   Flexion Limitations  VCs to decrease Lt scapular compensation which pt was bale to correct with cuing    ABduction  Left;5 reps   Same side lean into end of stretch   ABduction  Limitations  Demonstration for correct technique      Manual Therapy   Manual Therapy  Myofascial release;Passive ROM    Myofascial Release  To left axillary region at area of cording and UE being mindful of irradiated tissue pulling throughout P/ROM    Passive ROM  PROM left shoulder flexion and abduction in supine to pt tolerance focused on radiation positioning all to pts tolerance                  PT Long Term Goals - 12/05/17 1112      PT LONG TERM GOAL #1   Title  Patient will be independent with her HEP for shoulder ROM.    Status  Partially Met      PT LONG TERM GOAL #2   Title  Patient will demonstrate left shoulder active flexion increased to >/= 150 degrees for increased ease reaching overhead.    Baseline  100 degrees; 136 degrees - 11/20/17; 134 degrees- 12/05/17    Status  On-going      PT LONG TERM GOAL #3   Title  Patient will demonstrate left shoulder active abduction increased to >/= 160 degrees for increased ease obtaining radiation positioning.    Baseline  91 degrees; 153 degreees - 11/20/17; 133 degrees-12/05/17 (pt reports noting tissue tightness from radiation now)    Status  On-going      PT LONG TERM GOAL #4   Title  Patient will reduce DASH score to </= 20 to demonstrate overall increased left arm function.    Baseline  70.45; 43.18 - 12/05/17    Status  Partially Met      PT LONG TERM GOAL #5   Title  Patient will verbalize good understanding of lymphedema risk reduction practices.    Baseline  Pt has attended the ABC class and verbalizes feeling well educated-11/22/17    Status  Achieved            Plan - 12/12/17 1202    Clinical Impression Statement  Pts has completed 26/33 radiation treatments and though her skin is darkening, there are no openings and she seems to be tolerating radiation fairly. Did resume AA/ROM exercises today at end of session since she is doing well and she reports by end of session that  stretching again like that  felt good. Her A/ROM has improved by a few degrees and some decreased with abduction which is common to see at this late stage of radiation treatment of tissue becoming tighter.     Rehab Potential  Excellent    Clinical Impairments Affecting Rehab Potential  Will undergo extensive radiation    PT Frequency  2x / week   1x/wk during radiation   PT Duration  6 weeks    PT Treatment/Interventions  ADLs/Self Care Home Management;Therapeutic exercise;DME Instruction;Patient/family education;Manual techniques;Passive range of motion;Scar mobilization;Manual lymph drainage    PT Next Visit Plan  Cont 1x/wk during radiation then resume 2x/wk prn after. Continue focusing on end ROM stretching being mindful of irradiated skin/tissue. Pt may need to be put on hold after next week to alow for completing radiation and healing.     Consulted and Agree with Plan of Care  Patient       Patient will benefit from skilled therapeutic intervention in order to improve the following deficits and impairments:  Decreased skin integrity, Decreased scar mobility, Decreased knowledge of precautions, Decreased knowledge of use of DME, Increased fascial restricitons, Impaired UE functional use, Pain, Decreased range of motion, Postural dysfunction  Visit Diagnosis: Malignant neoplasm of upper-inner quadrant of left breast in female, estrogen receptor positive (HCC)  Abnormal posture  Aftercare following surgery for neoplasm  Stiffness of left shoulder, not elsewhere classified     Problem List Patient Active Problem List   Diagnosis Date Noted  . Family history of uterine cancer   . Family history of prostate cancer   . Cancer of left female breast  ([place]) 09/25/2017  . Melanoma in situ of right upper extremity ([place]) 07/18/2017  . Port-A-Cath in place 04/18/2017  . Drug-induced neutropenia ([place]) 03/09/2017  . Family history of breast cancer 02/02/2017  . Family history of colon cancer 02/02/2017  . Family  history of prostate cancer in father 02/02/2017  . Malignant neoplasm of overlapping sites of left breast in female, estrogen receptor positive ([place]) 01/31/2017  . Hx of hot flashes, menopausal, HRT in the past 12/19/2011  . Osteopenia, stable, DEXA 2013, recs to repeat in 2018 12/19/2011  . Hx of Hyperlipidemia, LDL 144 in 07/2011 12/19/2011  . Vitamin D insufficiency 12/19/2011  . Fibroid     Otelia Limes, PTA 12/12/2017, 12:13 PM  [place], [place], [address] Phone: [phone]   Fax:  [phone]  Name: RITHIKA SEEL MRN: [id number] Date of Birth: Sep 06, 1950

## 2017-12-13 ENCOUNTER — Ambulatory Visit
Admission: RE | Admit: 2017-12-13 | Discharge: 2017-12-13 | Disposition: A | Discharge status: Home / Self Care | Payer: Medicare Other | Source: Ambulatory Visit | Attending: Radiation Oncology | Admitting: Radiation Oncology

## 2017-12-13 DIAGNOSIS — C50812 Malignant neoplasm of overlapping sites of left female breast: Secondary | ICD-10-CM | POA: Diagnosis not present

## 2017-12-14 ENCOUNTER — Telehealth: Payer: Self-pay | Admitting: Genetic Counselor

## 2017-12-14 ENCOUNTER — Ambulatory Visit
Admission: RE | Admit: 2017-12-14 | Discharge: 2017-12-14 | Disposition: A | Discharge status: Home / Self Care | Payer: Medicare Other | Source: Ambulatory Visit | Attending: Radiation Oncology | Admitting: Radiation Oncology

## 2017-12-14 ENCOUNTER — Encounter: Payer: Self-pay | Admitting: Genetic Counselor

## 2017-12-14 DIAGNOSIS — Z1379 Encounter for other screening for genetic and chromosomal anomalies: Secondary | ICD-10-CM | POA: Insufficient documentation

## 2017-12-14 DIAGNOSIS — C50812 Malignant neoplasm of overlapping sites of left female breast: Secondary | ICD-10-CM | POA: Diagnosis not present

## 2017-12-14 DIAGNOSIS — Z7189 Other specified counseling: Secondary | ICD-10-CM | POA: Insufficient documentation

## 2017-12-14 NOTE — Telephone Encounter (Signed)
LM on VM that results were back and to please CB. 

## 2017-12-15 ENCOUNTER — Ambulatory Visit
Admission: RE | Admit: 2017-12-15 | Discharge: 2017-12-15 | Disposition: A | Discharge status: Home / Self Care | Payer: Medicare Other | Source: Ambulatory Visit | Attending: Radiation Oncology | Admitting: Radiation Oncology

## 2017-12-15 DIAGNOSIS — C50812 Malignant neoplasm of overlapping sites of left female breast: Secondary | ICD-10-CM

## 2017-12-15 DIAGNOSIS — Z170 Estrogen receptor positive status [ER+]: Principal | ICD-10-CM

## 2017-12-15 MED ORDER — SONAFINE EX EMUL
1.0000 "application " | Freq: Once | CUTANEOUS | Status: AC
  Administered 2017-12-15: 1 via TOPICAL

## 2017-12-15 NOTE — Telephone Encounter (Signed)
Revealed negative genetic testing.  Discussed that we do not know why she has breast cancer or why there is cancer in the family. It could be due to a different gene that we are not testing, or maybe our current technology may not be able to pick something up.  It will be important for her to keep in contact with genetics to keep up with whether additional testing may be needed. 

## 2017-12-18 ENCOUNTER — Ambulatory Visit
Admission: RE | Admit: 2017-12-18 | Discharge: 2017-12-18 | Disposition: A | Discharge status: Home / Self Care | Payer: Medicare Other | Source: Ambulatory Visit | Attending: Radiation Oncology | Admitting: Radiation Oncology

## 2017-12-18 DIAGNOSIS — C50812 Malignant neoplasm of overlapping sites of left female breast: Secondary | ICD-10-CM | POA: Diagnosis not present

## 2017-12-19 ENCOUNTER — Ambulatory Visit: Payer: Medicare Other

## 2017-12-19 ENCOUNTER — Ambulatory Visit
Admission: RE | Admit: 2017-12-19 | Discharge: 2017-12-19 | Disposition: A | Discharge status: Home / Self Care | Payer: Medicare Other | Source: Ambulatory Visit | Attending: Radiation Oncology | Admitting: Radiation Oncology

## 2017-12-19 DIAGNOSIS — R293 Abnormal posture: Secondary | ICD-10-CM

## 2017-12-19 DIAGNOSIS — C50212 Malignant neoplasm of upper-inner quadrant of left female breast: Secondary | ICD-10-CM

## 2017-12-19 DIAGNOSIS — Z170 Estrogen receptor positive status [ER+]: Principal | ICD-10-CM

## 2017-12-19 DIAGNOSIS — M25612 Stiffness of left shoulder, not elsewhere classified: Secondary | ICD-10-CM

## 2017-12-19 DIAGNOSIS — Z483 Aftercare following surgery for neoplasm: Secondary | ICD-10-CM

## 2017-12-19 DIAGNOSIS — C50812 Malignant neoplasm of overlapping sites of left female breast: Secondary | ICD-10-CM | POA: Diagnosis not present

## 2017-12-19 NOTE — Progress Notes (Signed)
[place]  Telephone:([phone]) [phone] Fax:([phone]) [phone]     ID: Brooke [name] DOB: 12-14-1950  MR#: [id number]  WCH#:[id number]  Patient Care Team: [name], DO as PCP - General (Family Medicine) Phineas Real, Belinda Block, MD (Obstetrics and Gynecology) Rolm Bookbinder, MD as Attending Physician (Dermatology) Magrinat, Virgie Dad, MD as Consulting Physician (Oncology) Kyung Rudd, MD as Consulting Physician (Radiation Oncology) Jovita Kussmaul, MD as Consulting Physician (General Surgery) Irene Limbo, MD as Consulting Physician (Plastic Surgery) Delice Bison, Charlestine Massed, NP as Nurse Practitioner (Hematology and Oncology) OTHER MD:  CHIEF COMPLAINT: Estrogen receptor positive breast cancer  CURRENT TREATMENT: Tamoxifen to start 01/28/2018  HISTORY OF CURRENT ILLNESS: From the original intake note:  Brooke [name] herself palpated a change in her left breast and brought this to medical attention.  On 01/18/2017 she underwent bilateral diagnostic mammography with tomography and bilateral breast ultrasonography at [facility].  The breast density was category B.  At the most recent exam was from December 2016.  In the right breast upper outer quadrant there was a 1 cm irregular mass associated with punctate calcification.  By ultrasound this was not well-defined, and it was biopsied on 01/26/2017 with tomographic guidance this showed a complex sclerosing lesion with the usual ductal hyperplasia.  The right axilla was sonographically  On the left mammography showed a 4 cm dense mass with indistinct margins in the upper inner quadrant.  Ultrasound defined a 3 cm irregular mass in the upper inner quadrant of the left breast which was palpable.  There was an abnormal lymph node in the left axilla.  Biopsy of the left mass and abnormal lymph node ([id number], on 01/26/2017) showed both to be involved by invasive ductal carcinoma, with some lobular features but E-cadherin positive, estrogen and  progesterone receptors both 100% positive, with strong staining intensity, with MIB-1 between 15-20%.  There was no HER-2 amplification, the signals ratio being 1.31/1.58 and the number per cell 2.30/2.05  The patient's subsequent history is as detailed below.  Brooke [name] returns today for follow-up and treatment of her estrogen receptor positive breast cancer. Since her last visit to the office, she had a bone density scan completed at [facility] on 10/26/2017 that showed: Low bone mass.   She also has been receiving radiation.  For the first week she took Xeloda, 2 tablets twice daily.  After 1 week she was not able to tolerate it.  The problem was that when she tried the breath-hold technique it really hurt her abdomen and she thought the Xeloda might be the reason for that.  She did stop the Xeloda and she did not have further problems with it.  She has brought the leftover medication here for pharmacy to dispose of  REVIEW OF SYSTEMS: Brooke [name] has had some fatigue and some erythema but no significant peeling from the radiation.  She is planning to start walking but has not really started doing that yet.  She denies unusual headaches, visual changes, cough, phlegm production, pleurisy, shortness of breath, or change in bowel or bladder habits.  A detailed review of systems today was noncontributory  PAST MEDICAL HISTORY: Past Medical History:  Diagnosis Date  . Anemia   . Anxiety   . Arthritis   . Bilateral dry eyes   . Breast cancer, left breast ([place])   . Family history of breast cancer 02/02/2017  . Family history of breast cancer   . Family history of colon cancer 02/02/2017  .  Family history of colon cancer   . Family history of prostate cancer   . Family history of prostate cancer in father 02/02/2017  . Family history of uterine cancer   . Fibroid   . History of kidney stones   . Hyperlipidemia   . Kidney stones   . Melanoma of upper arm ([place]) 2009   RIGHT  ARM   . Obesity (BMI 30.0-34.9)   . Osteopenia 09/2017   T score -1.9 max 8% / 0.8% statistically significant decline at right and left hip stable at the spine  . PONV (postoperative nausea and vomiting)   . Squamous carcinoma     of the skin  . UTI (urinary tract infection)   . Varicose veins of both lower extremities   . Vitamin D deficiency 07/2009   LOW VITAMIN D 29  . Vitamin deficiency 07/2008   VITAMIN D LOW 23    PAST SURGICAL HISTORY: Past Surgical History:  Procedure Laterality Date  . ABDOMINAL SURGERY  1975   ovairan cyst   . APPENDECTOMY    . BREAST LUMPECTOMY WITH RADIOACTIVE SEED LOCALIZATION Right 09/25/2017   Procedure: RIGHT BREAST LUMPECTOMY WITH RADIOACTIVE SEED LOCALIZATION;  Surgeon: Jovita Kussmaul, MD;  Location: [place];  Service: General;  Laterality: Right;  . BREAST SURGERY     bio  . CHOLECYSTECTOMY    . DERMOID CYST  EXCISION    . DILATION AND CURETTAGE OF UTERUS  1998  . GALLBLADDER SURGERY    . HYSTEROSCOPY    . insertion  port a cath  03/02/2016  . MASTECTOMY MODIFIED RADICAL Left 09/25/2017   Procedure: LEFT MODIFIED RADICAL MASTECTOMY;  Surgeon: Jovita Kussmaul, MD;  Location: [place];  Service: General;  Laterality: Left;  . PORTACATH PLACEMENT N/A 03/02/2017   Procedure: INSERTION PORT-A-CATH;  Surgeon: Jovita Kussmaul, MD;  Location: [place];  Service: General;  Laterality: N/A;  . SKIN BIOPSY    . SKIN CANCER EXCISION  2010,2011   2010-RIGHT ARM, 2011-LEFT LOWER LEG.-DR Rolm Bookbinder DERM. DR. Sarajane Jews IS HER DERM SURGEON.    FAMILY HISTORY Family History  Problem Relation Age of Onset  . Diabetes Father   . Hypertension Father   . Heart disease Father   . Prostate cancer Father 20  . Breast cancer Sister 70       again at age 84, GT results unk  . Skin cancer Sister   . Colon cancer Paternal Grandfather 41  . Hypertension Mother   . Heart failure Mother   . Uterine cancer Sister 69       stage IV  . Breast cancer Cousin        mat first  cousin dx under 70  The patient's father was diagnosed with prostate cancer in his 66s and died at age 63 from complications of that disease.  The patient's mother died at age 82 with congestive heart failure.  The patient had no brothers, 3 sisters.  One sister had breast cancer at age 72 and again at age 44.  She has been genetically tested and was found to be BRCA not mutated in addition a paternal grandfather had colon cancer in his 81s.  There is no history of ovarian cancer in the family to the patient's knowledge.  GYNECOLOGIC HISTORY:  Patient's last menstrual period was 03/18/1997. Menarche age 52. The patient is GX P0.  She stopped having periods in 1999 and took hormone replacement approximately 5 years.  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  She is now retired.  She is single and lives alone with no pets.    ADVANCED DIRECTIVES:    HEALTH MAINTENANCE: Social History   Tobacco Use  . Smoking status: Never Smoker  . Smokeless tobacco: Never Used  Substance Use Topics  . Alcohol use: No    Frequency: Never    Comment: rare  . Drug use: No     Colonoscopy: Never  PAP:  Bone density: 2017/osteopenia   Allergies  Allergen Reactions  . Neosporin [Neomycin-Bacitracin Zn-Polymyx] Swelling, Rash and Other (See Comments)    Blisters  . Benzalkonium Chloride Other (See Comments)    Redness (Pt is unaware)  It works by killing microorganisms and inhibiting their future growth, and for this reason frequently appears as an ingredient in antibacterial hand wipes, antiseptic creams and anti-itch ointments.  . Latex Rash    Current Outpatient Medications  Medication Sig Dispense Refill  . acetaminophen (TYLENOL) 500 MG tablet Take 500 mg by mouth every 6 (six) hours as needed for moderate pain or headache.    . alclomethasone (ACLOVATE) 0.05 % cream Apply to  affected area twice daily as needed for dermatitis  0  . b complex vitamins tablet Take 1 tablet by mouth daily.    . capecitabine (XELODA) 500 MG tablet Take 2 tablets (1049m) by mouth 2 times daily, immediatley after AM & PM meals. Take on radiation days only, M-F. 132 tablet 0  . ibuprofen (ADVIL,MOTRIN) 200 MG tablet Take 200 mg by mouth every 6 (six) hours as needed for headache or moderate pain.    .Marland KitchenLifitegrast (XIIDRA) 5 % SOLN Place 1 drop into both eyes daily as needed (dry eyes).    . Propylene Glycol (SYSTANE COMPLETE) 0.6 % SOLN Place 1 drop into both eyes daily.    . vitamin C (ASCORBIC ACID) 500 MG tablet Take 500 mg by mouth daily.    .Marland KitchenVITAMIN D, CHOLECALCIFEROL, PO Take 6 drops by mouth every other day. 1 drop = 1000 international units     No current facility-administered medications for this visit.     OBJECTIVE: Middle-aged white woman appears stated age  V6   12/20/17 1304  BP: (!) 148/77  Pulse: 77  Resp: 18  Temp: 98.3 F (36.8 C)  SpO2: 98%     Body mass index is 35.75 kg/m.   Wt Readings from Last 3 Encounters:  12/20/17 242 lb 1.6 oz (109.8 kg)  11/09/17 240 lb 3.2 oz (109 kg)  10/24/17 231 lb (104.8 kg)  ECOG FS:1 - Symptomatic but completely ambulatory  Sclerae unicteric, EOMs intact No cervical or supraclavicular adenopathy Lungs no rales or rhonchi Heart regular rate and rhythm Abd soft, nontender, positive bowel sounds MSK no focal spinal tenderness, no upper extremity lymphedema Neuro: nonfocal, well oriented, appropriate affect Breasts: Deferred     LAB RESULTS:  CMP     Component Value Date/Time   NA 140 12/20/2017 1144   NA 141 03/03/2017 1358   K 4.4 12/20/2017 1144   K 3.3 (L) 03/03/2017 1358   CL 105 12/20/2017 1144   CO2 24 12/20/2017 1144   CO2 28 03/03/2017 1358   GLUCOSE 99 12/20/2017 1144   GLUCOSE 107 03/03/2017 1358   BUN 12 12/20/2017 1144   BUN 12.7 03/03/2017 1358   CREATININE 0.80 12/20/2017 1144    CREATININE 0.79 07/11/2017 1040  CREATININE 0.9 03/03/2017 1358   CALCIUM 9.4 12/20/2017 1144   CALCIUM 9.4 03/03/2017 1358   PROT 6.8 12/20/2017 1144   PROT 6.5 03/03/2017 1358   ALBUMIN 4.0 12/20/2017 1144   ALBUMIN 3.7 03/03/2017 1358   AST 15 12/20/2017 1144   AST 17 07/11/2017 1040   AST 23 03/03/2017 1358   ALT 16 12/20/2017 1144   ALT 19 07/11/2017 1040   ALT 25 03/03/2017 1358   ALKPHOS 93 12/20/2017 1144   ALKPHOS 71 03/03/2017 1358   BILITOT 0.5 12/20/2017 1144   BILITOT 0.6 07/11/2017 1040   BILITOT 0.52 03/03/2017 1358   GFRNONAA >60 12/20/2017 1144   GFRNONAA >60 07/11/2017 1040   GFRAA >60 12/20/2017 1144   GFRAA >60 07/11/2017 1040    No results found for: TOTALPROTELP, ALBUMINELP, A1GS, A2GS, BETS, BETA2SER, GAMS, MSPIKE, SPEI  No results found for: Nils Pyle, [facility]  Lab Results  Component Value Date   WBC 4.1 12/20/2017   NEUTROABS 3.0 12/20/2017   HGB 13.1 12/20/2017   HCT 39.8 12/20/2017   MCV 89.0 12/20/2017   PLT 232 12/20/2017      Chemistry      Component Value Date/Time   NA 140 12/20/2017 1144   NA 141 03/03/2017 1358   K 4.4 12/20/2017 1144   K 3.3 (L) 03/03/2017 1358   CL 105 12/20/2017 1144   CO2 24 12/20/2017 1144   CO2 28 03/03/2017 1358   BUN 12 12/20/2017 1144   BUN 12.7 03/03/2017 1358   CREATININE 0.80 12/20/2017 1144   CREATININE 0.79 07/11/2017 1040   CREATININE 0.9 03/03/2017 1358      Component Value Date/Time   CALCIUM 9.4 12/20/2017 1144   CALCIUM 9.4 03/03/2017 1358   ALKPHOS 93 12/20/2017 1144   ALKPHOS 71 03/03/2017 1358   AST 15 12/20/2017 1144   AST 17 07/11/2017 1040   AST 23 03/03/2017 1358   ALT 16 12/20/2017 1144   ALT 19 07/11/2017 1040   ALT 25 03/03/2017 1358   BILITOT 0.5 12/20/2017 1144   BILITOT 0.6 07/11/2017 1040   BILITOT 0.52 03/03/2017 1358       No results found for: LABCA2  No components found for: [id number]  No results for input(s): INR in the last 168  hours.  No results found for: LABCA2  No results found for: OVF643  No results found for: PIR518  No results found for: ACZ660  No results found for: CA2729  No components found for: HGQUANT  No results found for: CEA1 / No results found for: CEA1   No results found for: AFPTUMOR  No results found for: [place]  No results found for: PSA1  Appointment on 12/20/2017  Component Date Value Ref Range Status  . WBC Count 12/20/2017 4.1  4.0 - 10.5 K/uL Final  . RBC 12/20/2017 4.47  3.87 - 5.11 MIL/uL Final  . Hemoglobin 12/20/2017 13.1  12.0 - 15.0 g/dL Final  . HCT 12/20/2017 39.8  36.0 - 46.0 % Final  . MCV 12/20/2017 89.0  80.0 - 100.0 fL Final  . MCH 12/20/2017 29.3  26.0 - 34.0 pg Final  . MCHC 12/20/2017 32.9  30.0 - 36.0 g/dL Final  . RDW 12/20/2017 13.7  11.5 - 15.5 % Final  . Platelet Count 12/20/2017 232  150 - 400 K/uL Final  . nRBC 12/20/2017 0.0  0.0 - 0.2 % Final  . Neutrophils Relative % 12/20/2017 72  % Final  . Neutro Abs 12/20/2017 3.0  1.7 - 7.7 K/uL Final  . Lymphocytes Relative 12/20/2017 12  % Final  . Lymphs Abs 12/20/2017 0.5* 0.7 - 4.0 K/uL Final  . Monocytes Relative 12/20/2017 12  % Final  . Monocytes Absolute 12/20/2017 0.5  0.1 - 1.0 K/uL Final  . Eosinophils Relative 12/20/2017 3  % Final  . Eosinophils Absolute 12/20/2017 0.1  0.0 - 0.5 K/uL Final  . Basophils Relative 12/20/2017 1  % Final  . Basophils Absolute 12/20/2017 0.0  0.0 - 0.1 K/uL Final  . Immature Granulocytes 12/20/2017 0  % Final  . Abs Immature Granulocytes 12/20/2017 0.01  0.00 - 0.07 K/uL Final   Performed at [facility], [place] [address], [place], [place] [address]  . Sodium 12/20/2017 140  135 - 145 mmol/L Final  . Potassium 12/20/2017 4.4  3.5 - 5.1 mmol/L Final  . Chloride 12/20/2017 105  98 - 111 mmol/L Final  . CO2 12/20/2017 24  22 - 32 mmol/L Final  . Glucose, Bld 12/20/2017 99  70 - 99 mg/dL Final  . BUN 12/20/2017 12  8 - 23 mg/dL Final   . Creatinine, Ser 12/20/2017 0.80  0.44 - 1.00 mg/dL Final  . Calcium 12/20/2017 9.4  8.9 - 10.3 mg/dL Final  . Total Protein 12/20/2017 6.8  6.5 - 8.1 g/dL Final  . Albumin 12/20/2017 4.0  3.5 - 5.0 g/dL Final  . AST 12/20/2017 15  15 - 41 U/L Final  . ALT 12/20/2017 16  0 - 44 U/L Final  . Alkaline Phosphatase 12/20/2017 93  38 - 126 U/L Final  . Total Bilirubin 12/20/2017 0.5  0.3 - 1.2 mg/dL Final  . GFR calc non Af Amer 12/20/2017 >60  >60 mL/min Final  . GFR calc Af Amer 12/20/2017 >60  >60 mL/min Final   Comment: (NOTE) The eGFR has been calculated using the CKD EPI equation. This calculation has not been validated in all clinical situations. eGFR's persistently <60 mL/min signify possible Chronic Kidney Disease.   Georgiann Hahn gap 12/20/2017 11  5 - 15 Final   Performed at [facility], [address] [place], [place] [address]    (this displays the last labs from the last 3 days)  No results found for: TOTALPROTELP, ALBUMINELP, A1GS, A2GS, BETS, BETA2SER, GAMS, MSPIKE, SPEI (this displays SPEP labs)  No results found for: KPAFRELGTCHN, LAMBDASER, KAPLAMBRATIO (kappa/lambda light chains)  No results found for: HGBA, HGBA2QUANT, HGBFQUANT, HGBSQUAN (Hemoglobinopathy evaluation)   No results found for: LDH  No results found for: IRON, TIBC, IRONPCTSAT (Iron and TIBC)  No results found for: FERRITIN  Urinalysis    Component Value Date/Time   COLORURINE YELLOW 05/29/2015 [place] 05/29/2015 1113   LABSPEC 1.023 05/29/2015 1113   PHURINE 5.5 05/29/2015 1113   GLUCOSEU NEGATIVE 05/29/2015 1113   GLUCOSEU NEGATIVE 11/05/2012 1559   HGBUR NEGATIVE 05/29/2015 1113   BILIRUBINUR negative 10/05/2017 1710   KETONESUR NEGATIVE 05/29/2015 1113   PROTEINUR Negative 10/05/2017 1710   PROTEINUR NEGATIVE 05/29/2015 1113   UROBILINOGEN 0.2 10/05/2017 1710   UROBILINOGEN 0.2 11/05/2012 1559   NITRITE negative 10/05/2017 1710   NITRITE  NEGATIVE 05/29/2015 1113   LEUKOCYTESUR Moderate (2+) (A) 10/05/2017 1710     STUDIES: Bone density scan on 10/26/2017 showed: Low bone mass.   ELIGIBLE FOR AVAILABLE RESEARCH PROTOCOL: [place]ale[place]  ASSESSMENT: 67 y.o. [place] woman status post bilateral biopsies 01/26/2017, showing  (1) in the right breast, a complex sclerosing lesion  (  2) in the left breast, a cT2 pN1 invasive ductal carcinoma (with some lobular features but E-cadherin positive), grade 2, estrogen and progesterone receptor positive, HER-2 not amplified, with an MIB-1 of 15-20%  (a) breast MRI 02/04/2017 suggests a T3 N1 tumor  (3) started neoadjuvant cyclophosphamide/docetaxel 03/03/2017, discontinued after 1 cycle with very poor tolerance  (a) started cyclophosphamide/methotrexate/fluorouracil (CMF) 03/28/2017, repeated x7 cycles, last dose 08/01/2017  (4) status post right lumpectomy and axillary lymph node dissection 09/25/17 for a ypT5 ypN2, stage IIIA invasive ductal carcinoma, grade 2, again estrogen and progesterone receptor positive, HER-2 not amplified  (5) adjuvant radiation to be completed 12/21/2017  (a) capecitabine sensitization tolerated only the initial week of radiation  ( 6) tamoxifen to start 01/28/2018  (7) genetics testing 12/12/2017 through the Multi-Gene Panel offered by [facility] found no deleterious mutations in AIP, ALK, APC, ATM, AXIN2,BAP1,  BARD1, BLM, BMPR1A, BRCA1, BRCA2, BRIP1, CASR, CDC73, CDH1, CDK4, CDKN1B, CDKN1C, CDKN2A (p14ARF), CDKN2A (p16INK4a), CEBPA, CHEK2, CTNNA1, DICER1, DIS3L2, EGFR (c.2369C>T, p.Thr790Met variant only), EPCAM (Deletion/duplication testing only), FH, FLCN, GATA2, GPC3, GREM1 (Promoter region deletion/duplication testing only), HOXB13 (c.251G>A, p.Gly84Glu), HRAS, KIT, MAX, MEN1, MET, MITF (c.952G>A, p.Glu318Lys variant only), MLH1, MSH2, MSH3, MSH6, MUTYH, NBN, NF1, NF2, NTHL1, PALB2, PDGFRA, PHOX2B, PMS2, POLD1, POLE, POT1, PRKAR1A, PTCH1, PTEN, RAD50, RAD51C,  RAD51D, RB1, RECQL4, RET, RUNX1, SDHAF2, SDHA (sequence changes only), SDHB, SDHC, SDHD, SMAD4, SMARCA4, SMARCB1, SMARCE1, STK11, SUFU, TERC, TERT, TMEM127, TP53, TSC1, TSC2, VHL, WRN and WT1.   (8) Foundation 1 testing shows stable microsatellite status and 1 mutation/ Mb, TP53 mutations x2  (a) PD-L1 pending   PLAN: I spent approximately 30 minutes face to face with [facility] with more than 50% of that time spent in counseling and coordination of care.  We reviewed her pathology report and she understands that she had significant residual disease which qualifies to stage III.  We discussed the significant risk of recurrence for stage III patients, which I quoted her in the two thirds range despite optimal antiestrogen treatment.  We discussed antiestrogens in detail.  With a good understanding of the possible toxicity side effects and complications we are going to start with tamoxifen.  If she does not tolerate it of course we would switch to an aromatase inhibitor.  She will start the tamoxifen 01/28/2018, after taking a few weeks to recover from the radiation.  She wanted to know about scans.  We certainly can do them either for baseline or follow-up.  I sometimes recommend to patients that they consider staging studies 2 years from surgery, since that will be the most informative point.  She is considering whether she would like to proceed with scans now or wait.  At this point she has no specific symptoms that require evaluation  We then discussed her foundation one results which unfortunately are not suggestive of a response to pembrolizumab or similar drugs.  We discussed CDK 4, 6 inhibitors in detail.  We discussed the stage IV data that shows these agents have double the time to disease progression in stage IV breast cancer patients taking antiestrogens.  We are hoping a significant benefit may be demonstrated in earlier stages as well and that is the point of the study.  She understands if she  does enroll she may drop the study at any point.  Also that even if she does enroll she only has a 50% chance of obtaining the drug, versus 0% chance of obtaining it if she does not enroll.  She  did meet with our research study nurse today to start working on this possibility.  Otherwise I have encouraged her to exercise more regularly.  She will start tamoxifen in about 6 weeks as noted, and then she will return to see me sometime in February.  She knows to call for any other issues that may develop before the next visit.  Virgie Dad Magrinat MD   12/20/17 4:38 PM Medical Oncology and Hematology [facility] [address] [place],  [address] Tel. [phone]    Fax. [phone]

## 2017-12-19 NOTE — Therapy (Signed)
[place], [place], [address] Phone: [phone]   Fax:  [phone]  Physical Therapy Treatment  Patient Details  Name: Brooke [name] MRN: [id number] Date of Birth: Jun 27, 1950 Referring Provider (PT): Dr. Autumn Messing   Encounter Date: 12/19/2017  PT End of Session - 12/19/17 1158    Visit Number  8    Number of Visits  20    Date for PT Re-Evaluation  01/16/18    PT Start Time  1107    PT Stop Time  1151    PT Time Calculation (min)  44 min    Activity Tolerance  Patient tolerated treatment well    Behavior During Therapy  [facility] for tasks assessed/performed       Past Medical History:  Diagnosis Date  . Anemia   . Anxiety   . Arthritis   . Bilateral dry eyes   . Breast cancer, left breast ([place])   . Family history of breast cancer 02/02/2017  . Family history of breast cancer   . Family history of colon cancer 02/02/2017  . Family history of colon cancer   . Family history of prostate cancer   . Family history of prostate cancer in father 02/02/2017  . Family history of uterine cancer   . Fibroid   . History of kidney stones   . Hyperlipidemia   . Kidney stones   . Melanoma of upper arm ([place]) 2009   RIGHT ARM   . Obesity (BMI 30.0-34.9)   . Osteopenia 09/2017   T score -1.9 max 8% / 0.8% statistically significant decline at right and left hip stable at the spine  . PONV (postoperative nausea and vomiting)   . Squamous carcinoma     of the skin  . UTI (urinary tract infection)   . Varicose veins of both lower extremities   . Vitamin D deficiency 07/2009   LOW VITAMIN D 29  . Vitamin deficiency 07/2008   VITAMIN D LOW 23    Past Surgical History:  Procedure Laterality Date  . ABDOMINAL SURGERY  1975   ovairan cyst   . APPENDECTOMY    . BREAST LUMPECTOMY WITH RADIOACTIVE SEED LOCALIZATION Right 09/25/2017   Procedure: RIGHT BREAST LUMPECTOMY WITH RADIOACTIVE SEED LOCALIZATION;   Surgeon: Jovita Kussmaul, MD;  Location: [facility];  Service: General;  Laterality: Right;  . BREAST SURGERY     bio  . CHOLECYSTECTOMY    . DERMOID CYST  EXCISION    . DILATION AND CURETTAGE OF UTERUS  1998  . GALLBLADDER SURGERY    . HYSTEROSCOPY    . insertion  port a cath  03/02/2016  . MASTECTOMY MODIFIED RADICAL Left 09/25/2017   Procedure: LEFT MODIFIED RADICAL MASTECTOMY;  Surgeon: Jovita Kussmaul, MD;  Location: [facility];  Service: General;  Laterality: Left;  . PORTACATH PLACEMENT N/A 03/02/2017   Procedure: INSERTION PORT-A-CATH;  Surgeon: Jovita Kussmaul, MD;  Location: [facility];  Service: General;  Laterality: N/A;  . SKIN BIOPSY    . SKIN CANCER EXCISION  2010,2011   2010-RIGHT ARM, 2011-LEFT LOWER LEG.-DR Rolm Bookbinder DERM. DR. Sarajane Jews IS HER DERM SURGEON.    There were no vitals filed for this visit.  Subjective Assessment - 12/19/17 1110    Subjective  I finish radiation in 2 days and I'll be so glad to be done! My ROM is very tight seems to be doing okay.     Pertinent History  Neoadjuvant  chemotherapy early 2019; left mastectomy and ALND (9/13 nodes positive) on 09/25/17.    Patient Stated Goals  Get my arm moving normally.    Currently in Pain?  No/denies         [facility] PT Assessment - 12/19/17 0001      AROM   Left Shoulder Flexion  142 Degrees    Left Shoulder ABduction  168 Degrees                   [facility] Adult PT Treatment/Exercise - 12/19/17 0001      Shoulder Exercises: Pulleys   Flexion  2 minutes    Flexion Limitations  Tactile cues briefly to decrease scapular compensation and then able to self correct    ABduction  2 minutes      Shoulder Exercises: Therapy Ball   Flexion  Both;10 reps   1 lb on each wrist; forward lean into end of stretch   ABduction  Left;5 reps   1 lb on wrist; same side lean into end of stretch     Manual Therapy   Manual Therapy  Myofascial release;Passive ROM    Myofascial Release  To left axillary region at area of  cording in axilla being mindful of irradiated tissue; cording was min palpable at beginning of session but less so by end    Passive ROM  PROM left shoulder flexion, abduction and er in supine to pt tolerance                   PT Long Term Goals - 12/05/17 1112      PT LONG TERM GOAL #1   Title  Patient will be independent with her HEP for shoulder ROM.    Status  Partially Met      PT LONG TERM GOAL #2   Title  Patient will demonstrate left shoulder active flexion increased to >/= 150 degrees for increased ease reaching overhead.    Baseline  100 degrees; 136 degrees - 11/20/17; 134 degrees- 12/05/17    Status  On-going      PT LONG TERM GOAL #3   Title  Patient will demonstrate left shoulder active abduction increased to >/= 160 degrees for increased ease obtaining radiation positioning.    Baseline  91 degrees; 153 degreees - 11/20/17; 133 degrees-12/05/17 (pt reports noting tissue tightness from radiation now)    Status  On-going      PT LONG TERM GOAL #4   Title  Patient will reduce DASH score to </= 20 to demonstrate overall increased left arm function.    Baseline  70.45; 43.18 - 12/05/17    Status  Partially Met      PT LONG TERM GOAL #5   Title  Patient will verbalize good understanding of lymphedema risk reduction practices.    Baseline  Pt has attended the ABC class and verbalizes feeling well educated-11/22/17    Status  Achieved            Plan - 12/19/17 1159    Clinical Impression Statement  Pts skin continues to overall look very good. No openings from radiation and her A/ROM was well improved from last time measured, especially with abduction, (though end ROM is still limited due to axillary cording and reports of tightness). Pt did well with adding weights to ball up wall reporting feeling challenged by this. She is feeling positive that her ROM has done so well and she has made 3 more appts 1x/wk  and we discussed D/C at that time and pt feels good about  that as she will have been better recovered from radiation by then (tissue tightness).     Rehab Potential  Excellent    Clinical Impairments Affecting Rehab Potential  Will undergo extensive radiation (which will end 12/21/17)    PT Frequency  2x / week   1x/wk during radiation   PT Duration  6 weeks    PT Treatment/Interventions  ADLs/Self Care Home Management;Therapeutic exercise;DME Instruction;Patient/family education;Manual techniques;Passive range of motion;Scar mobilization;Manual lymph drainage    PT Next Visit Plan  Continue focusing on end ROM stretching being mindful of irradiated skin/tissue (though pt finishes 10/24). Begin progressing pt with Strengthening exs, Strength ABC Program??    Consulted and Agree with Plan of Care  Patient       Patient will benefit from skilled therapeutic intervention in order to improve the following deficits and impairments:  Decreased skin integrity, Decreased scar mobility, Decreased knowledge of precautions, Decreased knowledge of use of DME, Increased fascial restricitons, Impaired UE functional use, Pain, Decreased range of motion, Postural dysfunction  Visit Diagnosis: Malignant neoplasm of upper-inner quadrant of left breast in female, estrogen receptor positive ([facility])  Abnormal posture  Aftercare following surgery for neoplasm  Stiffness of left shoulder, not elsewhere classified     Problem List Patient Active Problem List   Diagnosis Date Noted  . Genetic testing 12/14/2017  . Family history of uterine cancer   . Family history of prostate cancer   . Cancer of left female breast  ([place]) 09/25/2017  . Melanoma in situ of right upper extremity ([place] [facility]) 07/18/2017  . Port-A-Cath in place 04/18/2017  . Drug-induced neutropenia ([facility]) 03/09/2017  . Family history of breast cancer 02/02/2017  . Family history of colon cancer 02/02/2017  . Family history of prostate cancer in father 02/02/2017  . Malignant neoplasm of overlapping sites  of left breast in female, estrogen receptor positive ([place]) 01/31/2017  . Hx of hot flashes, menopausal, HRT in the past 12/19/2011  . Osteopenia, stable, DEXA 2013, recs to repeat in 2018 12/19/2011  . Hx of Hyperlipidemia, LDL 144 in 07/2011 12/19/2011  . Vitamin D insufficiency 12/19/2011  . Fibroid     Otelia Limes, PTA 12/19/2017, 12:10 PM  [place], [place], [address] Phone: [phone]   Fax:  [phone]  Name: Brooke [name] MRN: [id number] Date of Birth: [birth year]

## 2017-12-20 ENCOUNTER — Inpatient Hospital Stay (HOSPITAL_BASED_OUTPATIENT_CLINIC_OR_DEPARTMENT_OTHER): Payer: Medicare Other | Admitting: Oncology

## 2017-12-20 ENCOUNTER — Encounter: Payer: Self-pay | Admitting: *Deleted

## 2017-12-20 ENCOUNTER — Inpatient Hospital Stay: Payer: Medicare Other | Attending: Oncology

## 2017-12-20 ENCOUNTER — Ambulatory Visit
Admission: RE | Admit: 2017-12-20 | Discharge: 2017-12-20 | Disposition: A | Discharge status: Home / Self Care | Payer: Medicare Other | Source: Ambulatory Visit | Attending: Radiation Oncology | Admitting: Radiation Oncology

## 2017-12-20 ENCOUNTER — Inpatient Hospital Stay: Payer: Medicare Other

## 2017-12-20 VITALS — BP 148/77 | HR 77 | Temp 98.3°F | Resp 18 | Ht 69.0 in | Wt 242.1 lb

## 2017-12-20 DIAGNOSIS — Z800 Family history of malignant neoplasm of digestive organs: Secondary | ICD-10-CM

## 2017-12-20 DIAGNOSIS — M8580 Other specified disorders of bone density and structure, unspecified site: Secondary | ICD-10-CM | POA: Insufficient documentation

## 2017-12-20 DIAGNOSIS — C50812 Malignant neoplasm of overlapping sites of left female breast: Secondary | ICD-10-CM | POA: Diagnosis not present

## 2017-12-20 DIAGNOSIS — Z170 Estrogen receptor positive status [ER+]: Secondary | ICD-10-CM | POA: Diagnosis not present

## 2017-12-20 DIAGNOSIS — Z8042 Family history of malignant neoplasm of prostate: Secondary | ICD-10-CM

## 2017-12-20 DIAGNOSIS — Y842 Radiological procedure and radiotherapy as the cause of abnormal reaction of the patient, or of later complication, without mention of misadventure at the time of the procedure: Secondary | ICD-10-CM | POA: Diagnosis not present

## 2017-12-20 DIAGNOSIS — Z79899 Other long term (current) drug therapy: Secondary | ICD-10-CM | POA: Diagnosis not present

## 2017-12-20 DIAGNOSIS — R5383 Other fatigue: Secondary | ICD-10-CM

## 2017-12-20 DIAGNOSIS — L538 Other specified erythematous conditions: Secondary | ICD-10-CM | POA: Diagnosis not present

## 2017-12-20 DIAGNOSIS — C50212 Malignant neoplasm of upper-inner quadrant of left female breast: Secondary | ICD-10-CM | POA: Insufficient documentation

## 2017-12-20 DIAGNOSIS — D0361 Melanoma in situ of right upper limb, including shoulder: Secondary | ICD-10-CM

## 2017-12-20 DIAGNOSIS — Z803 Family history of malignant neoplasm of breast: Secondary | ICD-10-CM

## 2017-12-20 DIAGNOSIS — Z95828 Presence of other vascular implants and grafts: Secondary | ICD-10-CM

## 2017-12-20 DIAGNOSIS — Z452 Encounter for adjustment and management of vascular access device: Secondary | ICD-10-CM | POA: Insufficient documentation

## 2017-12-20 LAB — COMPREHENSIVE METABOLIC PANEL
ALBUMIN: 4 g/dL (ref 3.5–5.0)
ALT: 16 U/L (ref 0–44)
ANION GAP: 11 (ref 5–15)
AST: 15 U/L (ref 15–41)
Alkaline Phosphatase: 93 U/L (ref 38–126)
BUN: 12 mg/dL (ref 8–23)
CALCIUM: 9.4 mg/dL (ref 8.9–10.3)
CO2: 24 mmol/L (ref 22–32)
CREATININE: 0.8 mg/dL (ref 0.44–1.00)
Chloride: 105 mmol/L (ref 98–111)
GFR calc non Af Amer: >60 mL/min (ref >60)
Glucose, Bld: 99 mg/dL (ref 70–99)
Potassium: 4.4 mmol/L (ref 3.5–5.1)
SODIUM: 140 mmol/L (ref 135–145)
Total Bilirubin: 0.5 mg/dL (ref 0.3–1.2)
Total Protein: 6.8 g/dL (ref 6.5–8.1)

## 2017-12-20 LAB — CBC WITH DIFFERENTIAL (CANCER CENTER ONLY)
Abs Immature Granulocytes: 0.01 10*3/uL (ref 0.00–0.07)
BASOS ABS: 0 10*3/uL (ref 0.0–0.1)
BASOS PCT: 1 %
Eosinophils Absolute: 0.1 10*3/uL (ref 0.0–0.5)
Eosinophils Relative: 3 %
HCT: 39.8 % (ref 36.0–46.0)
Hemoglobin: 13.1 g/dL (ref 12.0–15.0)
Immature Granulocytes: 0 %
Lymphocytes Relative: 12 %
Lymphs Abs: 0.5 10*3/uL — ABNORMAL LOW (ref 0.7–4.0)
MCH: 29.3 pg (ref 26.0–34.0)
MCHC: 32.9 g/dL (ref 30.0–36.0)
MCV: 89 fL (ref 80.0–100.0)
MONO ABS: 0.5 10*3/uL (ref 0.1–1.0)
Monocytes Relative: 12 %
NEUTROS ABS: 3 10*3/uL (ref 1.7–7.7)
NRBC: 0 % (ref 0.0–0.2)
Neutrophils Relative %: 72 %
PLATELETS: 232 10*3/uL (ref 150–400)
RBC: 4.47 MIL/uL (ref 3.87–5.11)
RDW: 13.7 % (ref 11.5–15.5)
WBC: 4.1 10*3/uL (ref 4.0–10.5)

## 2017-12-20 MED ORDER — SODIUM CHLORIDE 0.9% FLUSH
10.0000 mL | INTRAVENOUS | Status: DC | PRN
  Administered 2017-12-20: 10 mL
  Filled 2017-12-20: qty 10

## 2017-12-20 MED ORDER — HEPARIN SOD (PORK) LOCK FLUSH 100 UNIT/ML IV SOLN
500.0000 [IU] | Freq: Once | INTRAVENOUS | Status: AC | PRN
  Administered 2017-12-20: 500 [IU]
  Filled 2017-12-20: qty 5

## 2017-12-21 ENCOUNTER — Ambulatory Visit
Admission: RE | Admit: 2017-12-21 | Discharge: 2017-12-21 | Disposition: A | Discharge status: Home / Self Care | Payer: Medicare Other | Source: Ambulatory Visit | Attending: Radiation Oncology | Admitting: Radiation Oncology

## 2017-12-21 ENCOUNTER — Encounter: Payer: Self-pay | Admitting: Radiation Oncology

## 2017-12-21 ENCOUNTER — Ambulatory Visit: Payer: Self-pay | Admitting: Genetic Counselor

## 2017-12-21 ENCOUNTER — Telehealth: Payer: Self-pay | Admitting: Oncology

## 2017-12-21 DIAGNOSIS — C50812 Malignant neoplasm of overlapping sites of left female breast: Secondary | ICD-10-CM

## 2017-12-21 DIAGNOSIS — Z170 Estrogen receptor positive status [ER+]: Principal | ICD-10-CM

## 2017-12-21 DIAGNOSIS — Z1379 Encounter for other screening for genetic and chromosomal anomalies: Secondary | ICD-10-CM

## 2017-12-21 MED ORDER — SONAFINE EX EMUL
1.0000 "application " | Freq: Once | CUTANEOUS | Status: AC
  Administered 2017-12-21: 1 via TOPICAL

## 2017-12-21 NOTE — Progress Notes (Signed)
HPI:  Ms. Hladik was previously seen in the [facility] clinic due to a personal and family history of cancer and concerns regarding a hereditary predisposition to cancer. Please refer to our prior cancer genetics clinic note for more information regarding Ms. Dingley's medical, social and family histories, and our assessment and recommendations, at the time. Ms. Sardo recent genetic test results were disclosed to her, as were recommendations warranted by these results. These results and recommendations are discussed in more detail below.  CANCER HISTORY:  Oncology History   Negative genetic testing     Malignant neoplasm of overlapping sites of left breast in female, estrogen receptor positive ([place])   01/31/2017 Initial Diagnosis    Malignant neoplasm of overlapping sites of left breast in female, estrogen receptor positive ([place])    10/11/2017 Cancer Staging    Staging form: Breast, AJCC 8th Edition - Pathologic: No Stage Recommended (ypT3, pN2a, cM0, G2, ER+, PR+, HER2-) - Signed by Gardenia Phlegm, NP on 10/11/2017    12/12/2017 Genetic Testing    Negative genetic testing on the Multi-cancer panel.  The Multi-Gene Panel offered by [facility] includes sequencing and/or deletion duplication testing of the following 84 genes: AIP, ALK, APC, ATM, AXIN2,BAP1,  BARD1, BLM, BMPR1A, BRCA1, BRCA2, BRIP1, CASR, CDC73, CDH1, CDK4, CDKN1B, CDKN1C, CDKN2A (p14ARF), CDKN2A (p16INK4a), CEBPA, CHEK2, CTNNA1, DICER1, DIS3L2, EGFR (c.2369C>T, p.Thr790Met variant only), EPCAM (Deletion/duplication testing only), FH, FLCN, GATA2, GPC3, GREM1 (Promoter region deletion/duplication testing only), HOXB13 (c.251G>A, p.Gly84Glu), HRAS, KIT, MAX, MEN1, MET, MITF (c.952G>A, p.Glu318Lys variant only), MLH1, MSH2, MSH3, MSH6, MUTYH, NBN, NF1, NF2, NTHL1, PALB2, PDGFRA, PHOX2B, PMS2, POLD1, POLE, POT1, PRKAR1A, PTCH1, PTEN, RAD50, RAD51C, RAD51D, RB1, RECQL4, RET, RUNX1, SDHAF2, SDHA (sequence changes  only), SDHB, SDHC, SDHD, SMAD4, SMARCA4, SMARCB1, SMARCE1, STK11, SUFU, TERC, TERT, TMEM127, TP53, TSC1, TSC2, VHL, WRN and WT1.  The report date is 12/12/2017.     FAMILY HISTORY:  We obtained a detailed, 4-generation family history.  Significant diagnoses are listed below: Family History  Problem Relation Age of Onset  . Diabetes Father   . Hypertension Father   . Heart disease Father   . Prostate cancer Father 45  . Breast cancer Sister 78       again at age 55, GT results unk  . Skin cancer Sister   . Colon cancer Paternal Grandfather 76  . Hypertension Mother   . Heart failure Mother   . Uterine cancer Sister 67       stage IV  . Breast cancer Cousin        mat first cousin dx under 72    The patient has no children.  She has three sisters.  One sister developed breast cancer at age 1 and again at 67.  She reportedly had genetic testing around 2010, that is reportedly negative.  A second sister was recently diagnosed with stage IV uterine cancer.  Both parents are deceased.  The patient's mother died at 47.  She had a multiple siblings, who did not have cancer.  One sister had a daughter with breast cancer under 76.  The maternal grandparents are deceased from non cancer related issues.  The patient's father died at 72.  He was diagnosed with prostate cancer at 75.  He had two brothers, one died young in a house fire and the second did not have cancer.  The paternal grandfather died of colon cancer.  Ms. Krul is aware of previous family history of genetic testing for hereditary  cancer risks. Patient's maternal ancestors are of [place] and [place] descent, and paternal ancestors are of [place] and [place] European descent. There is no reported Ashkenazi Jewish ancestry. There is no known consanguinity.  GENETIC TEST RESULTS: Genetic testing reported out on December 12, 2017 through the Multi-cancer panel found no deleterious mutations.  The Multi-Gene Panel offered by [facility]  includes sequencing and/or deletion duplication testing of the following 84 genes: AIP, ALK, APC, ATM, AXIN2,BAP1,  BARD1, BLM, BMPR1A, BRCA1, BRCA2, BRIP1, CASR, CDC73, CDH1, CDK4, CDKN1B, CDKN1C, CDKN2A (p14ARF), CDKN2A (p16INK4a), CEBPA, CHEK2, CTNNA1, DICER1, DIS3L2, EGFR (c.2369C>T, p.Thr790Met variant only), EPCAM (Deletion/duplication testing only), FH, FLCN, GATA2, GPC3, GREM1 (Promoter region deletion/duplication testing only), HOXB13 (c.251G>A, p.Gly84Glu), HRAS, KIT, MAX, MEN1, MET, MITF (c.952G>A, p.Glu318Lys variant only), MLH1, MSH2, MSH3, MSH6, MUTYH, NBN, NF1, NF2, NTHL1, PALB2, PDGFRA, PHOX2B, PMS2, POLD1, POLE, POT1, PRKAR1A, PTCH1, PTEN, RAD50, RAD51C, RAD51D, RB1, RECQL4, RET, RUNX1, SDHAF2, SDHA (sequence changes only), SDHB, SDHC, SDHD, SMAD4, SMARCA4, SMARCB1, SMARCE1, STK11, SUFU, TERC, TERT, TMEM127, TP53, TSC1, TSC2, VHL, WRN and WT1.  The test report has been scanned into EPIC and is located under the Molecular Pathology section of the Results Review tab.    We discussed with Ms. Trine that since the current genetic testing is not perfect, it is possible there may be a gene mutation in one of these genes that current testing cannot detect, but that chance is small.  We also discussed, that it is possible that another gene that has not yet been discovered, or that we have not yet tested, is responsible for the cancer diagnoses in the family, and it is, therefore, important to remain in touch with cancer genetics in the future so that we can continue to offer Ms. Denard the most up to date genetic testing.   CANCER SCREENING RECOMMENDATIONS: This result is reassuring and indicates that Ms. Massi likely does not have an increased risk for a future cancer due to a mutation in one of these genes. This normal test also suggests that Ms. Kistner's cancer was most likely not due to an inherited predisposition associated with one of these genes.  Most cancers happen by chance and this  negative test suggests that her cancer falls into this category.  We, therefore, recommended she continue to follow the cancer management and screening guidelines provided by her oncology and primary healthcare provider.   An individual's cancer risk and medical management are not determined by genetic test results alone. Overall cancer risk assessment incorporates additional factors, including personal medical history, family history, and any available genetic information that may result in a personalized plan for cancer prevention and surveillance.  RECOMMENDATIONS FOR FAMILY MEMBERS:  Individuals in this family might be at some increased risk of developing cancer, over the general population risk, simply due to the family history of cancer.  We recommended women in this family have a yearly mammogram beginning at age 57, or 24 years younger than the earliest onset of cancer, an annual clinical breast exam, and perform monthly breast self-exams. Women in this family should also have a gynecological exam as recommended by their primary provider. All family members should have a colonoscopy by age 5.  FOLLOW-UP: Lastly, we discussed with Ms. Gavilanes that cancer genetics is a rapidly advancing field and it is possible that new genetic tests will be appropriate for her and/or her family members in the future. We encouraged her to remain in contact with cancer genetics on an annual basis so we can  update her personal and family histories and let her know of advances in cancer genetics that may benefit this family.   Our contact number was provided. Ms. Grays questions were answered to her satisfaction, and she knows she is welcome to call us at anytime with additional questions or concerns.   Roma Kayser, MS, [facility] Certified Genetic Counselor [email or web address]

## 2017-12-21 NOTE — Telephone Encounter (Signed)
Per 10/24 sch msg, called patient with appt.

## 2017-12-22 ENCOUNTER — Encounter: Payer: Self-pay | Admitting: *Deleted

## 2017-12-22 ENCOUNTER — Telehealth: Payer: Self-pay | Admitting: *Deleted

## 2017-12-22 ENCOUNTER — Telehealth: Payer: Self-pay | Admitting: Oncology

## 2017-12-22 NOTE — Telephone Encounter (Signed)
Left message on machine for patient to return our call 

## 2017-12-22 NOTE — Telephone Encounter (Signed)
This RN returned VM left by pt stating concern she did not discuss with MD at visit.  Brooke [name] states she has an area on her " upper right lip " that is sore, slightly swollen and discolored "  She states she developed above symptom the first week into her radiation - " and I informed the radiation MD but he said that it was not related to the radiation therapy I was receiving ".  Pt also started xeloda the same week as radiation.  Per discussion - [name] denies prior issues with fever blisters. She does state the area was sensitive post her surgery in July and is wondering if relating to her intubation at that time.  She states she is also scheduled for her yearly exam with her dermatologist who per her call to them can examine as well at visit scheduled 01/09/2018.  This RN discussed above- possible

## 2017-12-22 NOTE — Telephone Encounter (Signed)
Appt scheduled LMVM for patient with date/time per 10/25 sch msg

## 2017-12-22 NOTE — Telephone Encounter (Signed)
Copied from [place] [id number]. Topic: Appointment Scheduling - Scheduling Inquiry for Clinic >> Dec 22, 2017  9:26 AM Sheran Luz wrote: Reason for CRM: Pt is requesting pneumococcal vaccination and high dose flu shot at same appointment and would like a call back to schedule.

## 2017-12-25 ENCOUNTER — Encounter (HOSPITAL_COMMUNITY): Payer: Self-pay | Admitting: Oncology

## 2017-12-28 ENCOUNTER — Encounter: Payer: Self-pay | Admitting: Genetic Counselor

## 2017-12-28 NOTE — Telephone Encounter (Signed)
Spoke with patient and appointment scheduled 

## 2018-01-01 ENCOUNTER — Ambulatory Visit: Payer: Medicare Other | Attending: General Surgery

## 2018-01-01 DIAGNOSIS — C50212 Malignant neoplasm of upper-inner quadrant of left female breast: Secondary | ICD-10-CM | POA: Diagnosis present

## 2018-01-01 DIAGNOSIS — Z170 Estrogen receptor positive status [ER+]: Secondary | ICD-10-CM | POA: Diagnosis present

## 2018-01-01 DIAGNOSIS — M25612 Stiffness of left shoulder, not elsewhere classified: Secondary | ICD-10-CM

## 2018-01-01 DIAGNOSIS — R293 Abnormal posture: Secondary | ICD-10-CM | POA: Diagnosis present

## 2018-01-01 DIAGNOSIS — Z483 Aftercare following surgery for neoplasm: Secondary | ICD-10-CM

## 2018-01-01 NOTE — Therapy (Signed)
[place], [place], [address] Phone: [phone]   Fax:  [phone]  Physical Therapy Treatment  Patient Details  Name: Brooke [name] MRN: [id number] Date of Birth: 1950/06/14 Referring Provider (PT): Dr. Autumn Messing   Encounter Date: 01/01/2018  PT End of Session - 01/01/18 1106    Visit Number  9    Number of Visits  20    Date for PT Re-Evaluation  01/16/18    PT Start Time  5400    PT Stop Time  1106    PT Time Calculation (min)  43 min    Activity Tolerance  Patient tolerated treatment well    Behavior During Therapy  [facility] for tasks assessed/performed       Past Medical History:  Diagnosis Date  . Anemia   . Anxiety   . Arthritis   . Bilateral dry eyes   . Breast cancer, left breast ([place])   . Family history of breast cancer 02/02/2017  . Family history of breast cancer   . Family history of colon cancer 02/02/2017  . Family history of colon cancer   . Family history of prostate cancer   . Family history of prostate cancer in father 02/02/2017  . Family history of uterine cancer   . Fibroid   . History of kidney stones   . Hyperlipidemia   . Kidney stones   . Melanoma of upper arm ([place]) 2009   RIGHT ARM   . Obesity (BMI 30.0-34.9)   . Osteopenia 09/2017   T score -1.9 max 8% / 0.8% statistically significant decline at right and left hip stable at the spine  . PONV (postoperative nausea and vomiting)   . Squamous carcinoma     of the skin  . UTI (urinary tract infection)   . Varicose veins of both lower extremities   . Vitamin D deficiency 07/2009   LOW VITAMIN D 29  . Vitamin deficiency 07/2008   VITAMIN D LOW 23    Past Surgical History:  Procedure Laterality Date  . ABDOMINAL SURGERY  1975   ovairan cyst   . APPENDECTOMY    . BREAST LUMPECTOMY WITH RADIOACTIVE SEED LOCALIZATION Right 09/25/2017   Procedure: RIGHT BREAST LUMPECTOMY WITH RADIOACTIVE SEED LOCALIZATION;   Surgeon: Jovita Kussmaul, MD;  Location: [facility];  Service: General;  Laterality: Right;  . BREAST SURGERY     bio  . CHOLECYSTECTOMY    . DERMOID CYST  EXCISION    . DILATION AND CURETTAGE OF UTERUS  1998  . GALLBLADDER SURGERY    . HYSTEROSCOPY    . insertion  port a cath  03/02/2016  . MASTECTOMY MODIFIED RADICAL Left 09/25/2017   Procedure: LEFT MODIFIED RADICAL MASTECTOMY;  Surgeon: Jovita Kussmaul, MD;  Location: [facility];  Service: General;  Laterality: Left;  . PORTACATH PLACEMENT N/A 03/02/2017   Procedure: INSERTION PORT-A-CATH;  Surgeon: Jovita Kussmaul, MD;  Location: [facility];  Service: General;  Laterality: N/A;  . SKIN BIOPSY    . SKIN CANCER EXCISION  2010,2011   2010-RIGHT ARM, 2011-LEFT LOWER LEG.-DR Rolm Bookbinder DERM. DR. Sarajane Jews IS HER DERM SURGEON.    There were no vitals filed for this visit.  Subjective Assessment - 01/01/18 1026    Subjective  Radiation is done and my skin seems to be handling it well. It's dry and peeling but I did not have any openings. I've been stretching and trying to work on the  tightness though it's still there, mostly in my axilla.     Pertinent History  Neoadjuvant chemotherapy early 2019; left mastectomy and ALND (9/13 nodes positive) on 09/25/17.    Patient Stated Goals  Get my arm moving normally.    Currently in Pain?  No/denies         [facility] PT Assessment - 01/01/18 0001      AROM   Right Shoulder Flexion  164 Degrees    Right Shoulder ABduction  174 Degrees    Left Shoulder Flexion  146 Degrees    Left Shoulder ABduction  169 Degrees                   [facility] Adult PT Treatment/Exercise - 01/01/18 0001      Shoulder Exercises: Standing   Other Standing Exercises  Bil UE 3 way raises with 1 lb, shoulders and head (as able) against wall, and back flat, core engaged 10 times each with pt returning therapist demonstration.       Shoulder Exercises: Pulleys   Flexion  2 minutes    Flexion Limitations  VCs to decrease Lt  scapular compensation but pt was able to perform this better    ABduction  2 minutes      Shoulder Exercises: Therapy Ball   Flexion  Both;10 reps   Forward lean into end of stretch   ABduction  Left;10 reps   1 lb on wrist; same side stretch into end of stretch     Manual Therapy   Manual Therapy  Myofascial release;Passive ROM    Myofascial Release  To left axillary region at area of cording in axilla being mindful of irradiated tissue; cording was min palpable at beginning of session but less so by end    Passive ROM  PROM left shoulder flexion, abduction and er in supine to pt tolerance              PT Education - 01/01/18 1043    Education Details  Standing 3 way raises     Person(s) Educated  Patient    Methods  Demonstration;Explanation;Handout    Comprehension  Verbalized understanding;Returned demonstration          PT Long Term Goals - 12/05/17 1112      PT LONG TERM GOAL #1   Title  Patient will be independent with her HEP for shoulder ROM.    Status  Partially Met      PT LONG TERM GOAL #2   Title  Patient will demonstrate left shoulder active flexion increased to >/= 150 degrees for increased ease reaching overhead.    Baseline  100 degrees; 136 degrees - 11/20/17; 134 degrees- 12/05/17    Status  On-going      PT LONG TERM GOAL #3   Title  Patient will demonstrate left shoulder active abduction increased to >/= 160 degrees for increased ease obtaining radiation positioning.    Baseline  91 degrees; 153 degreees - 11/20/17; 133 degrees-12/05/17 (pt reports noting tissue tightness from radiation now)    Status  On-going      PT LONG TERM GOAL #4   Title  Patient will reduce DASH score to </= 20 to demonstrate overall increased left arm function.    Baseline  70.45; 43.18 - 12/05/17    Status  Partially Met      PT LONG TERM GOAL #5   Title  Patient will verbalize good understanding of lymphedema risk reduction practices.  Baseline  Pt has attended the  ABC class and verbalizes feeling well educated-11/22/17    Status  Achieved            Plan - 01/01/18 1218    Clinical Impression Statement  Pt has now completed radiation and is coming for a few more visits to help her achieve better end ROM of Lt Ue with less tightness and cording, which is still palpable, though not less than initally was. Her A/ROM has improved slightly since ending radiation last week and progressed her with strengthening exercises of 3 way raises which she tolerated well. (She left her handout in room though so will need another copy at next session).     Rehab Potential  Excellent    Clinical Impairments Affecting Rehab Potential  Will undergo extensive radiation (which will end 12/21/17)    PT Frequency  2x / week    PT Duration  6 weeks    PT Treatment/Interventions  ADLs/Self Care Home Management;Therapeutic exercise;DME Instruction;Patient/family education;Manual techniques;Passive range of motion;Scar mobilization;Manual lymph drainage    PT Next Visit Plan  Continue focusing on end ROM stretching being mindful of healing irradiated skin/tissue. Begin progressing pt with Strengthening exs, Strength ABC Program??    Consulted and Agree with Plan of Care  Patient       Patient will benefit from skilled therapeutic intervention in order to improve the following deficits and impairments:  Decreased skin integrity, Decreased scar mobility, Decreased knowledge of precautions, Decreased knowledge of use of DME, Increased fascial restricitons, Impaired UE functional use, Pain, Decreased range of motion, Postural dysfunction  Visit Diagnosis: Malignant neoplasm of upper-inner quadrant of left breast in female, estrogen receptor positive ([facility])  Abnormal posture  Aftercare following surgery for neoplasm  Stiffness of left shoulder, not elsewhere classified     Problem List Patient Active Problem List   Diagnosis Date Noted  . Genetic testing 12/14/2017  .  Family history of uterine cancer   . Family history of prostate cancer   . Cancer of left female breast  ([place]) 09/25/2017  . Melanoma in situ of right upper extremity ([place]) 07/18/2017  . Port-A-Cath in place 04/18/2017  . Drug-induced neutropenia ([place]) 03/09/2017  . Family history of breast cancer 02/02/2017  . Family history of colon cancer 02/02/2017  . Family history of prostate cancer in father 02/02/2017  . Malignant neoplasm of overlapping sites of left breast in female, estrogen receptor positive ([place]) 01/31/2017  . Hx of hot flashes, menopausal, HRT in the past 12/19/2011  . Osteopenia, stable, DEXA 2013, recs to repeat in 2018 12/19/2011  . Hx of Hyperlipidemia, LDL 144 in 07/2011 12/19/2011  . Vitamin D insufficiency 12/19/2011  . Fibroid     Otelia Limes, PTA 01/01/2018, 12:24 PM  [place], [place], [address] Phone: [phone]   Fax:  [phone]  Name: JOLEE CRITCHER MRN: [id number] Date of Birth: 26-Sep-1950

## 2018-01-01 NOTE — Patient Instructions (Signed)

## 2018-01-02 ENCOUNTER — Ambulatory Visit (INDEPENDENT_AMBULATORY_CARE_PROVIDER_SITE_OTHER): Payer: Medicare Other

## 2018-01-02 DIAGNOSIS — Z23 Encounter for immunization: Secondary | ICD-10-CM

## 2018-01-02 NOTE — Progress Notes (Signed)
Per orders of Dr. Maudie Mercury, Influenza and pneumococcal 23 given by Rebecca Eaton. Patient tolerated injection well.

## 2018-01-04 NOTE — Progress Notes (Signed)
  Radiation Oncology         [phone] ________________________________  Name: Brooke [name] MRN: [id number]  Date: 12/21/2017  DOB: 1950-10-10  End of Treatment Note  Diagnosis:   67 y.o. female with Stage IIA, cT2N1M0, pT3N2aM0, grade 2, ER/PR positive invasive ductal carcinoma of the left breast    Indication for treatment:  Curative       Radiation treatment dates:   11/07/2017 - 12/21/2017  Site/dose:   The patient initially received a dose of 50.4 Gy in 28 fractions to the left chest wall and left supraclavicular region. This was delivered using a 3-D conformal, 4 field technique. The patient then received a boost to the mastectomy scar. This delivered an additional 10 Gy in 5 fractions using an en face electron field. The total dose was 60.4 Gy.  Narrative: The patient tolerated radiation treatment relatively well.   The patient had some expected skin irritation with hyperpigmentation and erythema as she progressed during treatment. Moist desquamation was not present at the end of treatment.  Plan: The patient has completed radiation treatment. The patient will return to radiation oncology clinic for routine followup in one month. I advised the patient to call or return sooner if they have any questions or concerns related to their recovery or treatment. ________________________________  Jodelle Gross, MD, PhD  This document serves as a record of services personally performed by Kyung Rudd, MD. It was created on his behalf by Rae Lips, a trained medical scribe. The creation of this record is based on the scribe's personal observations and the provider's statements to them. This document has been checked and approved by the attending provider.

## 2018-01-09 ENCOUNTER — Other Ambulatory Visit: Payer: Self-pay | Admitting: *Deleted

## 2018-01-10 ENCOUNTER — Ambulatory Visit: Payer: Medicare Other

## 2018-01-10 DIAGNOSIS — Z170 Estrogen receptor positive status [ER+]: Principal | ICD-10-CM

## 2018-01-10 DIAGNOSIS — M25612 Stiffness of left shoulder, not elsewhere classified: Secondary | ICD-10-CM

## 2018-01-10 DIAGNOSIS — Z483 Aftercare following surgery for neoplasm: Secondary | ICD-10-CM

## 2018-01-10 DIAGNOSIS — C50212 Malignant neoplasm of upper-inner quadrant of left female breast: Secondary | ICD-10-CM

## 2018-01-10 DIAGNOSIS — R293 Abnormal posture: Secondary | ICD-10-CM

## 2018-01-10 NOTE — Therapy (Addendum)
[place], [place], [address] Phone: [phone]   Fax:  [phone]  Physical Therapy Treatment    Patient Details  Name: Brooke [name] MRN: [id number] Date of Birth: 10-04-1950 Referring Provider (PT): Dr. Autumn Messing  Progress Note Reporting Period 10/26/17 to 01/10/18  See note below for Objective Data and Assessment of Progress/Goals. Brooke [name], Brooke [name] 01/11/18 1:01 PM      Encounter Date: 01/10/2018  PT End of Session - 01/10/18 1706    Visit Number  10    Number of Visits  20    Date for PT Re-Evaluation  01/16/18    PT Start Time  0569    PT Stop Time  1756    PT Time Calculation (min)  49 min    Activity Tolerance  Patient tolerated treatment well    Behavior During Therapy  [facility] for tasks assessed/performed       Past Medical History:  Diagnosis Date  . Anemia   . Anxiety   . Arthritis   . Bilateral dry eyes   . Breast cancer, left breast ([place])   . Family history of breast cancer 02/02/2017  . Family history of breast cancer   . Family history of colon cancer 02/02/2017  . Family history of colon cancer   . Family history of prostate cancer   . Family history of prostate cancer in father 02/02/2017  . Family history of uterine cancer   . Fibroid   . History of kidney stones   . Hyperlipidemia   . Kidney stones   . Melanoma of upper arm ([place]) 2009   RIGHT ARM   . Obesity (BMI 30.0-34.9)   . Osteopenia 09/2017   T score -1.9 max 8% / 0.8% statistically significant decline at right and left hip stable at the spine  . PONV (postoperative nausea and vomiting)   . Squamous carcinoma     of the skin  . UTI (urinary tract infection)   . Varicose veins of both lower extremities   . Vitamin D deficiency 07/2009   LOW VITAMIN D 29  . Vitamin deficiency 07/2008   VITAMIN D LOW 23    Past Surgical History:  Procedure Laterality Date  . ABDOMINAL SURGERY  1975   ovairan  cyst   . APPENDECTOMY    . BREAST LUMPECTOMY WITH RADIOACTIVE SEED LOCALIZATION Right 09/25/2017   Procedure: RIGHT BREAST LUMPECTOMY WITH RADIOACTIVE SEED LOCALIZATION;  Surgeon: Jovita Kussmaul, MD;  Location: [facility];  Service: General;  Laterality: Right;  . BREAST SURGERY     bio  . CHOLECYSTECTOMY    . DERMOID CYST  EXCISION    . DILATION AND CURETTAGE OF UTERUS  1998  . GALLBLADDER SURGERY    . HYSTEROSCOPY    . insertion  port a cath  03/02/2016  . MASTECTOMY MODIFIED RADICAL Left 09/25/2017   Procedure: LEFT MODIFIED RADICAL MASTECTOMY;  Surgeon: Jovita Kussmaul, MD;  Location: [facility];  Service: General;  Laterality: Left;  . PORTACATH PLACEMENT N/A 03/02/2017   Procedure: INSERTION PORT-A-CATH;  Surgeon: Jovita Kussmaul, MD;  Location: [facility];  Service: General;  Laterality: N/A;  . SKIN BIOPSY    . SKIN CANCER EXCISION  2010,2011   2010-RIGHT ARM, 2011-LEFT LOWER LEG.-DR Rolm Bookbinder DERM. DR. Sarajane Jews IS HER DERM SURGEON.    There were no vitals filed for this visit.  Subjective Assessment - 01/10/18 1612    Subjective  I  had a biopsy of my lip today of a bump that's been on my upper lip for over a month now. My tightness, though still present, is improving at my Lt axilla.    Pertinent History  Neoadjuvant chemotherapy early 2019; left mastectomy and ALND (9/13 nodes positive) on 09/25/17.    Patient Stated Goals  Get my arm moving normally.    Currently in Pain?  No/denies                       [facility] Adult PT Treatment/Exercise - 01/10/18 0001      Exercises   Exercises  Other Exercises    Other Exercises   Began instruction of Strength ABC Program today getting through all stretches.      Manual Therapy   Passive ROM  PROM left shoulder flexion and abduction in supine to pt tolerance              PT Education - 01/10/18 1705    Education Details  Began instruction of Strength ABC Program getting through stretches    Person(s) Educated  Patient     Methods  Explanation;Demonstration;Handout    Comprehension  Verbalized understanding;Returned demonstration;Need further instruction          PT Long Term Goals - 01/10/18 1723      PT LONG TERM GOAL #1   Title  Patient will be independent with her HEP for shoulder ROM.    Baseline  Began instruction of Strength ABC Program today and pt is independent with other HEP thus far-01/10/18    Status  Partially Met      PT LONG TERM GOAL #2   Title  Patient will demonstrate left shoulder active flexion increased to >/= 150 degrees for increased ease reaching overhead.    Baseline  100 degrees; 136 degrees - 11/20/17; 134 degrees- 12/05/17; 146 degrees - 01/01/18    Status  Partially Met      PT LONG TERM GOAL #3   Title  Patient will demonstrate left shoulder active abduction increased to >/= 160 degrees for increased ease obtaining radiation positioning.    Baseline  91 degrees; 153 degreees - 11/20/17; 133 degrees-12/05/17 (pt reports noting tissue tightness from radiation now); 169 degrees-01/01/18    Status  Achieved      PT LONG TERM GOAL #4   Title  Patient will reduce DASH score to </= 20 to demonstrate overall increased left arm function.    Baseline  70.45; 43.18 - 12/05/17    Status  Partially Met      PT LONG TERM GOAL #5   Title  Patient will verbalize good understanding of lymphedema risk reduction practices.    Baseline  Pt has attended the ABC class and verbalizes feeling well educated-11/22/17    Status  Achieved            Plan - 01/10/18 1719    Clinical Impression Statement  Began instruction of Stength ABC Program which pt tolerated very well. She reports being encouraged by learning this and having a concrete plan to follow once she is discharged.     Rehab Potential  Excellent    Clinical Impairments Affecting Rehab Potential  Will undergo extensive radiation (which will end 12/21/17)    PT Frequency  2x / week    PT Duration  6 weeks    PT  Treatment/Interventions  ADLs/Self Care Home Management;Therapeutic exercise;DME Instruction;Patient/family education;Manual techniques;Passive range of motion;Scar mobilization;Manual lymph drainage  PT Next Visit Plan  Complete instruction of Strength ABC Program beginning with Core exercises. Then if time, end ROM stretching to Lt upper quadrant.      Consulted and Agree with Plan of Care  Patient       Patient will benefit from skilled therapeutic intervention in order to improve the following deficits and impairments:  Decreased skin integrity, Decreased scar mobility, Decreased knowledge of precautions, Decreased knowledge of use of DME, Increased fascial restricitons, Impaired UE functional use, Pain, Decreased range of motion, Postural dysfunction  Visit Diagnosis: Malignant neoplasm of upper-inner quadrant of left breast in female, estrogen receptor positive ([facility]CC)  Abnormal posture  Aftercare following surgery for neoplasm  Stiffness of left shoulder, not elsewhere classified     Problem List Patient Active Problem List   Diagnosis Date Noted  . Genetic testing 12/14/2017  . Family history of uterine cancer   . Family history of prostate cancer   . Cancer of left female breast  ([place]) 09/25/2017  . Melanoma in situ of right upper extremity ([place]) 07/18/2017  . Port-A-Cath in place 04/18/2017  . Drug-induced neutropenia ([place]) 03/09/2017  . Family history of breast cancer 02/02/2017  . Family history of colon cancer 02/02/2017  . Family history of prostate cancer in father 02/02/2017  . Malignant neoplasm of overlapping sites of left breast in female, estrogen receptor positive ([place]) 01/31/2017  . Hx of hot flashes, menopausal, HRT in the past 12/19/2011  . Osteopenia, stable, DEXA 2013, recs to repeat in 2018 12/19/2011  . Hx of Hyperlipidemia, LDL 144 in 07/2011 12/19/2011  . Vitamin D insufficiency 12/19/2011  . Fibroid     Otelia Limes, PTA 01/10/2018,  5:26 PM  [place], [place], [address] Phone: [phone]   Fax:  [phone]  Name: Brooke [name] MRN: [id number] Date of Birth: [birth year]

## 2018-01-10 NOTE — Patient Instructions (Signed)

## 2018-01-16 ENCOUNTER — Ambulatory Visit: Payer: Medicare Other

## 2018-01-16 DIAGNOSIS — Z483 Aftercare following surgery for neoplasm: Secondary | ICD-10-CM

## 2018-01-16 DIAGNOSIS — Z170 Estrogen receptor positive status [ER+]: Principal | ICD-10-CM

## 2018-01-16 DIAGNOSIS — R293 Abnormal posture: Secondary | ICD-10-CM

## 2018-01-16 DIAGNOSIS — M25612 Stiffness of left shoulder, not elsewhere classified: Secondary | ICD-10-CM

## 2018-01-16 DIAGNOSIS — C50212 Malignant neoplasm of upper-inner quadrant of left female breast: Secondary | ICD-10-CM

## 2018-01-16 NOTE — Therapy (Addendum)
[place], [place], [address] Phone: [phone]   Fax:  [phone]  Physical Therapy Treatment  Patient Details  Name: JISEL FLEET MRN: [id number] Date of Birth: 1950/09/07 Referring Provider (PT): Dr. Autumn Messing   Encounter Date: 01/16/2018  PT End of Session - 01/16/18 1214    Visit Number  11    Number of Visits  20    Date for PT Re-Evaluation  01/16/18    PT Start Time  1107    PT Stop Time  1209    PT Time Calculation (min)  62 min       Past Medical History:  Diagnosis Date  . Anemia   . Anxiety   . Arthritis   . Bilateral dry eyes   . Breast cancer, left breast ([facility])   . Family history of breast cancer 02/02/2017  . Family history of breast cancer   . Family history of colon cancer 02/02/2017  . Family history of colon cancer   . Family history of prostate cancer   . Family history of prostate cancer in father 02/02/2017  . Family history of uterine cancer   . Fibroid   . History of kidney stones   . Hyperlipidemia   . Kidney stones   . Melanoma of upper arm ([facility]) 2009   RIGHT ARM   . Obesity (BMI 30.0-34.9)   . Osteopenia 09/2017   T score -1.9 max 8% / 0.8% statistically significant decline at right and left hip stable at the spine  . PONV (postoperative nausea and vomiting)   . Squamous carcinoma     of the skin  . UTI (urinary tract infection)   . Varicose veins of both lower extremities   . Vitamin D deficiency 07/2009   LOW VITAMIN D 29  . Vitamin deficiency 07/2008   VITAMIN D LOW 23    Past Surgical History:  Procedure Laterality Date  . ABDOMINAL SURGERY  1975   ovairan cyst   . APPENDECTOMY    . BREAST LUMPECTOMY WITH RADIOACTIVE SEED LOCALIZATION Right 09/25/2017   Procedure: RIGHT BREAST LUMPECTOMY WITH RADIOACTIVE SEED LOCALIZATION;  Surgeon: Jovita Kussmaul, MD;  Location: [facility];  Service: General;  Laterality: Right;  . BREAST SURGERY     bio  .  CHOLECYSTECTOMY    . DERMOID CYST  EXCISION    . DILATION AND CURETTAGE OF UTERUS  1998  . GALLBLADDER SURGERY    . HYSTEROSCOPY    . insertion  port a cath  03/02/2016  . MASTECTOMY MODIFIED RADICAL Left 09/25/2017   Procedure: LEFT MODIFIED RADICAL MASTECTOMY;  Surgeon: Jovita Kussmaul, MD;  Location: [facility];  Service: General;  Laterality: Left;  . PORTACATH PLACEMENT N/A 03/02/2017   Procedure: INSERTION PORT-A-CATH;  Surgeon: Jovita Kussmaul, MD;  Location: [facility];  Service: General;  Laterality: N/A;  . SKIN BIOPSY    . SKIN CANCER EXCISION  2010,2011   2010-RIGHT ARM, 2011-LEFT LOWER LEG.-DR Rolm Bookbinder DERM. DR. Sarajane Jews IS HER DERM SURGEON.    There were no vitals filed for this visit.  Subjective Assessment - 01/16/18 1131    Subjective  I got measured for my compression sleeve yesterday. Just want to review best times to wear it. Ready to finish learning the Strength ABC Program and to D/C.    Pertinent History  Neoadjuvant chemotherapy early 2019; left mastectomy and ALND (9/13 nodes positive) on 09/25/17.    Patient Stated Goals  Get my arm moving normally.    Currently in Pain?  No/denies               Katina Dung - 01/16/18 0001    Open a tight or new jar  Moderate difficulty    Do heavy household chores (wash walls, wash floors)  Moderate difficulty    Carry a shopping bag or briefcase  Mild difficulty    Wash your back  Moderate difficulty    Use a knife to cut food  Mild difficulty    Recreational activities in which you take some force or impact through your arm, shoulder, or hand (golf, hammering, tennis)  Moderate difficulty    During the past week, to what extent has your arm, shoulder or hand problem interfered with your normal social activities with family, friends, neighbors, or groups?  Not at all    During the past week, to what extent has your arm, shoulder or hand problem limited your work or other regular daily activities  Slightly    Arm, shoulder, or  hand pain.  Moderate    Tingling (pins and needles) in your arm, shoulder, or hand  Moderate    Difficulty Sleeping  No difficulty    DASH Score  34.09 %             [facility] Adult PT Treatment/Exercise - 01/16/18 0001      Self-Care   Other Self-Care Comments   Answered pts questions reviewing with her best times to wear sleeve for preventitive measures, and signs/symptoms to look for if swelling becomes more persistent to then begin wearing sleeve daily. Pt verbalized understanding all.      Exercises   Other Exercises   Completed instruction of Strength ABC Program working from core exercises through to the end. Pt unable to do squats due to knee pain, did try to modify with wall slides and squeezing ball in knees but pt still reported pain, though less, with this so instructed not to do. She verbalized understanding.             PT Education - 01/16/18 1154    Education Details  Completed instruction of Strength ABC Program; also issued handouts for [employer] at the [facility] and calendar    Person(s) Educated  Patient    Methods  Explanation;Demonstration;Handout    Comprehension  Verbalized understanding;Returned demonstration          PT Long Term Goals - 01/16/18 1156      PT LONG TERM GOAL #1   Title  Patient will be independent with her HEP for shoulder ROM.    Baseline  Began instruction of Strength ABC Program today and pt is independent with other HEP thus far-01/10/18      PT LONG TERM GOAL #2   Title  Patient will demonstrate left shoulder active flexion increased to >/= 150 degrees for increased ease reaching overhead.    Baseline  100 degrees; 136 degrees - 11/20/17; 134 degrees- 12/05/17; 146 degrees - 01/01/18; 152 degrees-01/16/18    Status  Achieved      PT LONG TERM GOAL #3   Title  Patient will demonstrate left shoulder active abduction increased to >/= 160 degrees for increased ease obtaining radiation positioning.    Baseline  91 degrees; 153  degreees - 11/20/17; 133 degrees-12/05/17 (pt reports noting tissue tightness from radiation now); 169 degrees-01/01/18; 172 degree-01/16/18      PT LONG TERM GOAL #4   Title  Patient will  reduce DASH score to </= 20 to demonstrate overall increased left arm function.    Baseline  70.45; 43.18 - 12/05/17; 34.09- 01/16/18    Status  Partially Met      PT LONG TERM GOAL #5   Title  Patient will verbalize good understanding of lymphedema risk reduction practices.    Baseline  Pt has attended the ABC class and verbalizes feeling well educated-11/22/17; reviewed and answered pts questions regarding times to wear her compression sleeve once this arrives-01/16/18    Status  Achieved            Plan - 01/16/18 1216    Clinical Impression Statement  Completed instruction of Strength ABC Program which pt did well with. Tried to modify squats with wall sldies as this was painful to pts knees, but this wa still painful, though less so, so instructed pt to do more step ups instead as this wasn't painful. Also issued handouts for [employer] and Calendar of activities, like Yoga that pt was interested in, for Novemeber. Pt has met all goals except DASH, though this improved drastically. She is ready for D/C at this time.     Rehab Potential  Excellent    Clinical Impairments Affecting Rehab Potential  Will undergo extensive radiation (which will end 12/21/17)    PT Frequency  2x / week    PT Duration  6 weeks    PT Treatment/Interventions  ADLs/Self Care Home Management;Therapeutic exercise;DME Instruction;Patient/family education;Manual techniques;Passive range of motion;Scar mobilization;Manual lymph drainage    PT Next Visit Plan  D/C this visit.     Consulted and Agree with Plan of Care  Patient       Patient will benefit from skilled therapeutic intervention in order to improve the following deficits and impairments:  Decreased skin integrity, Decreased scar mobility, Decreased knowledge of  precautions, Decreased knowledge of use of DME, Increased fascial restricitons, Impaired UE functional use, Pain, Decreased range of motion, Postural dysfunction  Visit Diagnosis: Malignant neoplasm of upper-inner quadrant of left breast in female, estrogen receptor positive ([facility])  Abnormal posture  Aftercare following surgery for neoplasm  Stiffness of left shoulder, not elsewhere classified     Problem List Patient Active Problem List   Diagnosis Date Noted  . Genetic testing 12/14/2017  . Family history of uterine cancer   . Family history of prostate cancer   . Cancer of left female breast  ([facility]) 09/25/2017  . Melanoma in situ of right upper extremity ([facility]) 07/18/2017  . Port-A-Cath in place 04/18/2017  . Drug-induced neutropenia ([facility]) 03/09/2017  . Family history of breast cancer 02/02/2017  . Family history of colon cancer 02/02/2017  . Family history of prostate cancer in father 02/02/2017  . Malignant neoplasm of overlapping sites of left breast in female, estrogen receptor positive ([facility]) 01/31/2017  . Hx of hot flashes, menopausal, HRT in the past 12/19/2011  . Osteopenia, stable, DEXA 2013, recs to repeat in 2018 12/19/2011  . Hx of Hyperlipidemia, LDL 144 in 07/2011 12/19/2011  . Vitamin D insufficiency 12/19/2011  . Fibroid     Otelia Limes, PTA 01/16/2018, 12:27 PM  [place], [place], [address] Phone: [phone]   Fax:  [phone]  Name: LUTISHA KNOCHE MRN: [id number] Date of Birth: 1950-04-15  PHYSICAL THERAPY DISCHARGE SUMMARY  Visits from Start of Care: 11  Current functional level related to goals / functional outcomes: Goals met except those related to shoulder ROM. Her  ROM is much improved but still mildly limited which will likely continue to increase with her HEP.   Remaining deficits: Mild shoulder ROM deficits   Education /  Equipment: HEP Plan: Patient agrees to discharge.  Patient goals were partially met. Patient is being discharged due to being pleased with the current functional level.  ?????         Annia Friendly, [name] 01/16/18 3:17 PM

## 2018-01-23 ENCOUNTER — Encounter: Payer: Self-pay | Admitting: Family Medicine

## 2018-02-05 ENCOUNTER — Other Ambulatory Visit: Payer: Self-pay | Admitting: Oncology

## 2018-02-05 ENCOUNTER — Encounter: Payer: Self-pay | Admitting: Radiation Oncology

## 2018-02-05 ENCOUNTER — Other Ambulatory Visit: Payer: Self-pay

## 2018-02-05 ENCOUNTER — Ambulatory Visit
Admission: RE | Admit: 2018-02-05 | Discharge: 2018-02-05 | Disposition: A | Discharge status: Home / Self Care | Payer: Medicare Other | Source: Ambulatory Visit | Attending: Radiation Oncology | Admitting: Radiation Oncology

## 2018-02-05 VITALS — BP 147/63 | HR 92 | Temp 98.4°F | Resp 20 | Ht 69.0 in | Wt 244.4 lb

## 2018-02-05 DIAGNOSIS — Z170 Estrogen receptor positive status [ER+]: Secondary | ICD-10-CM | POA: Diagnosis not present

## 2018-02-05 DIAGNOSIS — Z79899 Other long term (current) drug therapy: Secondary | ICD-10-CM | POA: Diagnosis not present

## 2018-02-05 DIAGNOSIS — C50912 Malignant neoplasm of unspecified site of left female breast: Secondary | ICD-10-CM | POA: Diagnosis present

## 2018-02-05 DIAGNOSIS — C50812 Malignant neoplasm of overlapping sites of left female breast: Secondary | ICD-10-CM

## 2018-02-05 DIAGNOSIS — C50012 Malignant neoplasm of nipple and areola, left female breast: Secondary | ICD-10-CM

## 2018-02-05 MED ORDER — TAMOXIFEN CITRATE 20 MG PO TABS: 20.0000 mg | ORAL_TABLET | Freq: Every day | ORAL | 12 refills | Status: AC

## 2018-02-05 NOTE — Progress Notes (Signed)
Radiation Oncology         [phone] ________________________________  Name: Brooke [name] MRN: [id number]  Date of Service: 02/05/2018  DOB: [birth year]  Post Treatment Note  CC: [name], DO  [name], DO  Diagnosis:   Stage IIA, cT2N1M0, pT3N2aM0, grade 2, ER/PR positive invasive ductal carcinoma of the left breast    Interval Since Last Radiation:  7 weeks   11/07/2017 - 12/21/2017: The patient initially received a dose of 50.4 Gy in 28 fractions to the left chest wall and regional nodes. This was delivered using a 3-D conformal, 4 field technique. The patient then received a boost to the mastectomy scar. This delivered an additional 10 Gy in 5 fractions using an en face electron field. The total dose was 60.4 Gy.  Narrative:  The patient returns today for routine follow-up. During treatment she did very well with radiotherapy and did not have significant desquamation.                             On review of systems, the patient states she's doing well without skin issues. She has not yet started antiestrogen. No other complaints are noted.   ALLERGIES:  is allergic to neosporin [neomycin-bacitracin zn-polymyx]; benzalkonium chloride; and latex.  Meds: Current Outpatient Medications  Medication Sig Dispense Refill  . b complex vitamins tablet Take 1 tablet by mouth daily.    Marland Kitchen Propylene Glycol (SYSTANE COMPLETE) 0.6 % SOLN Place 1 drop into both eyes daily.    . vitamin C (ASCORBIC ACID) 500 MG tablet Take 500 mg by mouth daily.    Marland Kitchen VITAMIN D, CHOLECALCIFEROL, PO Take 6 drops by mouth every other day. 1 drop = 1000 international units    . acetaminophen (TYLENOL) 500 MG tablet Take 500 mg by mouth every 6 (six) hours as needed for moderate pain or headache.    . alclomethasone (ACLOVATE) 0.05 % cream Apply to affected area twice daily as needed for dermatitis  0  . betamethasone dipropionate (DIPROLENE) 0.05 % cream APPLY THIN LAYER BID FOR 3 TO 4 WEEKS  0  .  capecitabine (XELODA) 500 MG tablet Take 2 tablets (1000mg ) by mouth 2 times daily, immediatley after AM & PM meals. Take on radiation days only, M-F. (Patient not taking: Reported on 01/10/2018) 132 tablet 0  . ibuprofen (ADVIL,MOTRIN) 200 MG tablet Take 200 mg by mouth every 6 (six) hours as needed for headache or moderate pain.    Marland Kitchen Lifitegrast (XIIDRA) 5 % SOLN Place 1 drop into both eyes daily as needed (dry eyes).     No current facility-administered medications for this encounter.     Physical Findings:  height is 5\' 9"  (1.753 m) and weight is 244 lb 6.4 oz (110.9 kg). Her oral temperature is 98.4 F (36.9 C). Her blood pressure is 147/63 (abnormal) and her pulse is 92. Her respiration is 20 and oxygen saturation is 97%.  Pain Assessment Pain Score: 0-No pain/10 In general this is a well appearing caucasian female in no acute distress. She's alert and oriented x4 and appropriate throughout the examination. Cardiopulmonary assessment is negative for acute distress and she exhibits normal effort. The left breast was examined and reveals mild hyperpigmentation.   Lab Findings: Lab Results  Component Value Date   WBC 4.1 12/20/2017   HGB 13.1 12/20/2017   HCT 39.8 12/20/2017   MCV 89.0 12/20/2017   PLT 232 12/20/2017  Radiographic Findings: No results found.  Impression/Plan: 1. Stage IIA, cT2N1M0, pT3N2aM0, grade 2, ER/PR positive invasive ductal carcinoma of the left breast  . The patient has been doing well since completion of radiotherapy. We discussed that we would be happy to continue to follow her as needed, but she will also continue to follow up with Dr. [name] in medical oncology. She was counseled on skin care as well as measures to avoid sun exposure to this area.  2. Survivorship. We discussed the importance of survivorship evaluation and she is currently scheduled for this in the near future. She was also given the monthly calendar for access to resources offered  within the cancer center.     Carola Rhine, PAC

## 2018-02-19 ENCOUNTER — Telehealth: Payer: Self-pay | Admitting: *Deleted

## 2018-02-19 NOTE — Telephone Encounter (Signed)
This RN spoke with pt per her VM stating concern due to written information regarding side effects of tamoxifn stating   " potentially deadly side effects including stroke and endometrial cancer "   Brooke [name] states concerns due to " my mother died of a stroke and my sister is dealing with stage 4 endometrial cancer ".  This RN discussed above as side effects with very low potential. Pt's concerns validated.  Brooke [name] stated " I want to have the medication which is most benign "  This RN discussed that all medications have side effects and best plan is for pt to come in and discuss the medications so she can make an informed decision.  Appointment made for next week.

## 2018-02-26 NOTE — Progress Notes (Signed)
[place]  [phone] Fax:([phone]    ID: Brooke [name] DOB: [birth year]  MR#: [id number]  GUY#:[id number]  Patient Care Team: [name], DO as PCP - General (Family Medicine) Phineas Real, Belinda Block, MD (Obstetrics and Gynecology) Rolm Bookbinder, MD as Attending Physician (Dermatology) Magrinat, Virgie Dad, MD as Consulting Physician (Oncology) Kyung Rudd, MD as Consulting Physician (Radiation Oncology) Jovita Kussmaul, MD as Consulting Physician (General Surgery) Irene Limbo, MD as Consulting Physician (Plastic Surgery) Delice Bison, Charlestine Massed, NP as Nurse Practitioner (Hematology and Oncology) OTHER MD:  CHIEF COMPLAINT: Estrogen receptor positive breast cancer  CURRENT TREATMENT: Considering antiestrogens  HISTORY OF CURRENT ILLNESS: From the original intake note:  [name] herself palpated a change in her left breast and brought this to medical attention.  On 01/18/2017 she underwent bilateral diagnostic mammography with tomography and bilateral breast ultrasonography at [facility].  The breast density was category B.  At the most recent exam was from December 2016.  In the right breast upper outer quadrant there was a 1 cm irregular mass associated with punctate calcification.  By ultrasound this was not well-defined, and it was biopsied on 01/26/2017 with tomographic guidance this showed a complex sclerosing lesion with the usual ductal hyperplasia.  The right axilla was sonographically  On the left mammography showed a 4 cm dense mass with indistinct margins in the upper inner quadrant.  Ultrasound defined a 3 cm irregular mass in the upper inner quadrant of the left breast which was palpable.  There was an abnormal lymph node in the left axilla.  Biopsy of the left mass and abnormal lymph node ([id number], on 01/26/2017) showed both to be involved by invasive ductal carcinoma, with some lobular features but E-cadherin positive, estrogen and  progesterone receptors both 100% positive, with strong staining intensity, with MIB-1 between 15-20%.  There was no HER-2 amplification, the signals ratio being 1.31/1.58 and the number per cell 2.30/2.05  The patient's subsequent history is as detailed below.  [place] returns today for follow-up of her estrogen receptor positive breast cancer.  The patient continues on tamoxifen. She is concerned about the side effects, particularly stroke and endometrial cancer. She wants to know more details about the risks and what the likelihood of them developing are.   Since her last visit here on 12/20/2017, she completed radiation, which lasted from 11/07/2017 to 12/21/2017. Site/dose: The patient initially received a dose of 50.4 Gy in 28 fractions to the left chest wall and left supraclavicular region. This was delivered using a 3-D conformal, 4 field technique. The patient then received a boost to the mastectomy scar. This delivered an additional 10 Gy in 5 fractions using an en face electron field. The total dose was 60.4 Gy.   REVIEW OF SYSTEMS: Brooke [name]'s hair is growing back in full, but she mentions that it is not the same texture as before. For exercise, she has not recently been active, but she likes to walk and complete cardio based activities. The patient denies unusual headaches, visual changes, nausea, vomiting, or dizziness. There has been no unusual cough, phlegm production, or pleurisy. This been no change in bowel or bladder habits. The patient denies unexplained fatigue or unexplained weight loss, bleeding, rash, or fever. A detailed review of systems was otherwise noncontributory.    PAST MEDICAL HISTORY: Past Medical History:  Diagnosis Date  . Anemia   . Anxiety   . Arthritis   . Bilateral dry eyes   . Breast  cancer, left breast ([place])   . Family history of breast cancer 02/02/2017  . Family history of breast cancer   . Family history of colon cancer 02/02/2017  .  Family history of colon cancer   . Family history of prostate cancer   . Family history of prostate cancer in father 02/02/2017  . Family history of uterine cancer   . Fibroid   . History of kidney stones   . Hyperlipidemia   . Kidney stones   . Melanoma of upper arm ([place]) 2009   RIGHT ARM   . Obesity (BMI 30.0-34.9)   . Osteopenia 09/2017   T score -1.9 max 8% / 0.8% statistically significant decline at right and left hip stable at the spine  . PONV (postoperative nausea and vomiting)   . Squamous carcinoma     of the skin  . UTI (urinary tract infection)   . Varicose veins of both lower extremities   . Vitamin D deficiency 07/2009   LOW VITAMIN D 29  . Vitamin deficiency 07/2008   VITAMIN D LOW 23    PAST SURGICAL HISTORY: Past Surgical History:  Procedure Laterality Date  . ABDOMINAL SURGERY  1975   ovairan cyst   . APPENDECTOMY    . BREAST LUMPECTOMY WITH RADIOACTIVE SEED LOCALIZATION Right 09/25/2017   Procedure: RIGHT BREAST LUMPECTOMY WITH RADIOACTIVE SEED LOCALIZATION;  Surgeon: Jovita Kussmaul, MD;  Location: [facility];  Service: General;  Laterality: Right;  . BREAST SURGERY     bio  . CHOLECYSTECTOMY    . DERMOID CYST  EXCISION    . DILATION AND CURETTAGE OF UTERUS  1998  . GALLBLADDER SURGERY    . HYSTEROSCOPY    . insertion  port a cath  03/02/2016  . MASTECTOMY MODIFIED RADICAL Left 09/25/2017   Procedure: LEFT MODIFIED RADICAL MASTECTOMY;  Surgeon: Jovita Kussmaul, MD;  Location: [place];  Service: General;  Laterality: Left;  . PORTACATH PLACEMENT N/A 03/02/2017   Procedure: INSERTION PORT-A-CATH;  Surgeon: Jovita Kussmaul, MD;  Location: [place];  Service: General;  Laterality: N/A;  . SKIN BIOPSY    . SKIN CANCER EXCISION  2010,2011   2010-RIGHT ARM, 2011-LEFT LOWER LEG.-DR Rolm Bookbinder DERM. DR. Sarajane Jews IS HER DERM SURGEON.    FAMILY HISTORY Family History  Problem Relation Age of Onset  . Diabetes Father   . Hypertension Father   . Heart disease Father   .  Prostate cancer Father 70  . Breast cancer Sister 72       again at age 67, GT results unk  . Skin cancer Sister   . Colon cancer Paternal Grandfather 11  . Hypertension Mother   . Heart failure Mother   . Uterine cancer Sister 72       stage IV  . Breast cancer Cousin        mat first cousin dx under 52   The patient's father was diagnosed with prostate cancer in his 74s and died at age 54 from complications of that disease.  The patient's mother died at age 23 with congestive heart failure.  The patient had no brothers, 3 sisters.  One sister had breast cancer at age 58 and again at age 47.  She has been genetically tested and was found to be BRCA not mutated in addition a paternal grandfather had colon cancer in his 57s.  There is no history of ovarian cancer in the family to the patient's knowledge.   GYNECOLOGIC HISTORY:  Patient's last menstrual period was 03/18/1997. Menarche age 73. The patient is GX P0.  She stopped having periods in 1999 and took hormone replacement approximately 5 years.   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  She is now retired.  She is single and lives alone with no pets.    ADVANCED DIRECTIVES:    HEALTH MAINTENANCE: Social History   Tobacco Use  . Smoking status: Never Smoker  . Smokeless tobacco: Never Used  Substance Use Topics  . Alcohol use: No    Frequency: Never    Comment: rare  . Drug use: No    Colonoscopy: Never  PAP:  Bone density: 2017/osteopenia   Allergies  Allergen Reactions  . Neosporin [Neomycin-Bacitracin Zn-Polymyx] Swelling, Rash and Other (See Comments)    Blisters  . Benzalkonium Chloride Other (See Comments)    Redness (Pt is unaware)  It works by killing microorganisms and inhibiting their future growth, and for this reason frequently appears as an ingredient in antibacterial hand wipes,  antiseptic creams and anti-itch ointments.  . Latex Rash    Current Outpatient Medications  Medication Sig Dispense Refill  . acetaminophen (TYLENOL) 500 MG tablet Take 500 mg by mouth every 6 (six) hours as needed for moderate pain or headache.    . alclomethasone (ACLOVATE) 0.05 % cream Apply to affected area twice daily as needed for dermatitis  0  . b complex vitamins tablet Take 1 tablet by mouth daily.    . betamethasone dipropionate (DIPROLENE) 0.05 % cream APPLY THIN LAYER BID FOR 3 TO 4 WEEKS  0  . capecitabine (XELODA) 500 MG tablet Take 2 tablets (1069m) by mouth 2 times daily, immediatley after AM & PM meals. Take on radiation days only, M-F. (Patient not taking: Reported on 01/10/2018) 132 tablet 0  . ibuprofen (ADVIL,MOTRIN) 200 MG tablet Take 200 mg by mouth every 6 (six) hours as needed for headache or moderate pain.    .Marland KitchenLifitegrast (XIIDRA) 5 % SOLN Place 1 drop into both eyes daily as needed (dry eyes).    . Propylene Glycol (SYSTANE COMPLETE) 0.6 % SOLN Place 1 drop into both eyes daily.    . tamoxifen (NOLVADEX) 20 MG tablet Take 1 tablet (20 mg total) by mouth daily. 90 tablet 12  . vitamin C (ASCORBIC ACID) 500 MG tablet Take 500 mg by mouth daily.    .Marland KitchenVITAMIN D, CHOLECALCIFEROL, PO Take 6 drops by mouth every other day. 1 drop = 1000 international units     No current facility-administered medications for this visit.     OBJECTIVE: Middle-aged white woman in no acute distress  Vitals:   02/27/18 1059  BP: (!) 120/92  Pulse: 88  Resp: 18  Temp: 98.2 F (36.8 C)  SpO2: 98%     Body mass index is 36.28 kg/m.   Wt Readings from Last 3 Encounters:  02/27/18 245 lb 11.2 oz (111.4 kg)  02/05/18 244 lb 6.4 oz (110.9 kg)  12/20/17 242 lb 1.6 oz (109.8 kg)  ECOG FS:1 - Symptomatic but completely ambulatory  Sclerae unicteric, pupils round and equal Oropharynx clear and moist No cervical or supraclavicular adenopathy Lungs no rales or rhonchi Heart regular  rate and rhythm Abd soft, nontender, positive bowel sounds MSK no focal spinal tenderness, no upper extremity lymphedema Neuro: nonfocal, well oriented, appropriate affect Breasts: Right breast is status post lumpectomy.  There is no  concern regarding mass or skin or nipple change.  The left breast is status post mastectomy and radiation.  There is no evidence of chest wall recurrence.  Both axillae are benign.    LAB RESULTS:  CMP     Component Value Date/Time   NA 140 12/20/2017 1144   NA 141 03/03/2017 1358   K 4.4 12/20/2017 1144   K 3.3 (L) 03/03/2017 1358   CL 105 12/20/2017 1144   CO2 24 12/20/2017 1144   CO2 28 03/03/2017 1358   GLUCOSE 99 12/20/2017 1144   GLUCOSE 107 03/03/2017 1358   BUN 12 12/20/2017 1144   BUN 12.7 03/03/2017 1358   CREATININE 0.80 12/20/2017 1144   CREATININE 0.79 07/11/2017 1040   CREATININE 0.9 03/03/2017 1358   CALCIUM 9.4 12/20/2017 1144   CALCIUM 9.4 03/03/2017 1358   PROT 6.8 12/20/2017 1144   PROT 6.5 03/03/2017 1358   ALBUMIN 4.0 12/20/2017 1144   ALBUMIN 3.7 03/03/2017 1358   AST 15 12/20/2017 1144   AST 17 07/11/2017 1040   AST 23 03/03/2017 1358   ALT 16 12/20/2017 1144   ALT 19 07/11/2017 1040   ALT 25 03/03/2017 1358   ALKPHOS 93 12/20/2017 1144   ALKPHOS 71 03/03/2017 1358   BILITOT 0.5 12/20/2017 1144   BILITOT 0.6 07/11/2017 1040   BILITOT 0.52 03/03/2017 1358   GFRNONAA >60 12/20/2017 1144   GFRNONAA >60 07/11/2017 1040   GFRAA >60 12/20/2017 1144   GFRAA >60 07/11/2017 1040    No results found for: TOTALPROTELP, ALBUMINELP, A1GS, A2GS, BETS, BETA2SER, GAMS, MSPIKE, SPEI  No results found for: KPAFRELGTCHN, LAMBDASER, [facility]  Lab Results  Component Value Date   WBC 4.1 12/20/2017   NEUTROABS 3.0 12/20/2017   HGB 13.1 12/20/2017   HCT 39.8 12/20/2017   MCV 89.0 12/20/2017   PLT 232 12/20/2017      Chemistry      Component Value Date/Time   NA 140 12/20/2017 1144   NA 141 03/03/2017 1358   K 4.4  12/20/2017 1144   K 3.3 (L) 03/03/2017 1358   CL 105 12/20/2017 1144   CO2 24 12/20/2017 1144   CO2 28 03/03/2017 1358   BUN 12 12/20/2017 1144   BUN 12.7 03/03/2017 1358   CREATININE 0.80 12/20/2017 1144   CREATININE 0.79 07/11/2017 1040   CREATININE 0.9 03/03/2017 1358      Component Value Date/Time   CALCIUM 9.4 12/20/2017 1144   CALCIUM 9.4 03/03/2017 1358   ALKPHOS 93 12/20/2017 1144   ALKPHOS 71 03/03/2017 1358   AST 15 12/20/2017 1144   AST 17 07/11/2017 1040   AST 23 03/03/2017 1358   ALT 16 12/20/2017 1144   ALT 19 07/11/2017 1040   ALT 25 03/03/2017 1358   BILITOT 0.5 12/20/2017 1144   BILITOT 0.6 07/11/2017 1040   BILITOT 0.52 03/03/2017 1358       No results found for: LABCA2  No components found for: EXNTZG017  No results for input(s): INR in the last 168 hours.  No results found for: LABCA2  No results found for: CBS496  No results found for: PRF163  No results found for: WGY659  No results found for: CA2729  No components found for: HGQUANT  No results found for: CEA1 / No results found for: CEA1   No results found for: AFPTUMOR  No results found for: CHROMOGRNA  No results found for: PSA1  No visits with results within 3 Day(s) from this visit.  Latest  known visit with results is:  Appointment on 12/20/2017  Component Date Value Ref Range Status  . WBC Count 12/20/2017 4.1  4.0 - 10.5 K/uL Final  . RBC 12/20/2017 4.47  3.87 - 5.11 MIL/uL Final  . Hemoglobin 12/20/2017 13.1  12.0 - 15.0 g/dL Final  . HCT 12/20/2017 39.8  36.0 - 46.0 % Final  . MCV 12/20/2017 89.0  80.0 - 100.0 fL Final  . MCH 12/20/2017 29.3  26.0 - 34.0 pg Final  . MCHC 12/20/2017 32.9  30.0 - 36.0 g/dL Final  . RDW 12/20/2017 13.7  11.5 - 15.5 % Final  . Platelet Count 12/20/2017 232  150 - 400 K/uL Final  . nRBC 12/20/2017 0.0  0.0 - 0.2 % Final  . Neutrophils Relative % 12/20/2017 72  % Final  . Neutro Abs 12/20/2017 3.0  1.7 - 7.7 K/uL Final  . Lymphocytes  Relative 12/20/2017 12  % Final  . Lymphs Abs 12/20/2017 0.5* 0.7 - 4.0 K/uL Final  . Monocytes Relative 12/20/2017 12  % Final  . Monocytes Absolute 12/20/2017 0.5  0.1 - 1.0 K/uL Final  . Eosinophils Relative 12/20/2017 3  % Final  . Eosinophils Absolute 12/20/2017 0.1  0.0 - 0.5 K/uL Final  . Basophils Relative 12/20/2017 1  % Final  . Basophils Absolute 12/20/2017 0.0  0.0 - 0.1 K/uL Final  . Immature Granulocytes 12/20/2017 0  % Final  . Abs Immature Granulocytes 12/20/2017 0.01  0.00 - 0.07 K/uL Final   Performed at [facility], [place] [address] [place], [place] [address]  . Sodium 12/20/2017 140  135 - 145 mmol/L Final  . Potassium 12/20/2017 4.4  3.5 - 5.1 mmol/L Final  . Chloride 12/20/2017 105  98 - 111 mmol/L Final  . CO2 12/20/2017 24  22 - 32 mmol/L Final  . Glucose, Bld 12/20/2017 99  70 - 99 mg/dL Final  . BUN 12/20/2017 12  8 - 23 mg/dL Final  . Creatinine, Ser 12/20/2017 0.80  0.44 - 1.00 mg/dL Final  . Calcium 12/20/2017 9.4  8.9 - 10.3 mg/dL Final  . Total Protein 12/20/2017 6.8  6.5 - 8.1 g/dL Final  . Albumin 12/20/2017 4.0  3.5 - 5.0 g/dL Final  . AST 12/20/2017 15  15 - 41 U/L Final  . ALT 12/20/2017 16  0 - 44 U/L Final  . Alkaline Phosphatase 12/20/2017 93  38 - 126 U/L Final  . Total Bilirubin 12/20/2017 0.5  0.3 - 1.2 mg/dL Final  . GFR calc non Af Amer 12/20/2017 >60  >60 mL/min Final  . GFR calc Af Amer 12/20/2017 >60  >60 mL/min Final   Comment: (NOTE) The eGFR has been calculated using the CKD EPI equation. This calculation has not been validated in all clinical situations. eGFR's persistently <60 mL/min signify possible Chronic Kidney Disease.   Georgiann Hahn gap 12/20/2017 11  5 - 15 Final   Performed at [facility], [place] [address], [place], [place] [address]    (this displays the last labs from the last 3 days)  No results found for: TOTALPROTELP, ALBUMINELP, A1GS, A2GS, BETS, BETA2SER, GAMS, MSPIKE,  SPEI (this displays SPEP labs)  No results found for: KPAFRELGTCHN, LAMBDASER, KAPLAMBRATIO (kappa/lambda light chains)  No results found for: HGBA, HGBA2QUANT, HGBFQUANT, HGBSQUAN (Hemoglobinopathy evaluation)   No results found for: LDH  No results found for: IRON, TIBC, IRONPCTSAT (Iron and TIBC)  No results found for: FERRITIN  Urinalysis  Component Value Date/Time   COLORURINE YELLOW 05/29/2015 1113   APPEARANCEUR CLEAR 05/29/2015 1113   LABSPEC 1.023 05/29/2015 1113   PHURINE 5.5 05/29/2015 1113   GLUCOSEU NEGATIVE 05/29/2015 1113   GLUCOSEU NEGATIVE 11/05/2012 1559   HGBUR NEGATIVE 05/29/2015 1113   BILIRUBINUR negative 10/05/2017 1710   KETONESUR NEGATIVE 05/29/2015 1113   PROTEINUR Negative 10/05/2017 1710   PROTEINUR NEGATIVE 05/29/2015 1113   UROBILINOGEN 0.2 10/05/2017 1710   UROBILINOGEN 0.2 11/05/2012 1559   NITRITE negative 10/05/2017 1710   NITRITE NEGATIVE 05/29/2015 1113   LEUKOCYTESUR Moderate (2+) (A) 10/05/2017 1710     STUDIES: No results found.   ELIGIBLE FOR AVAILABLE RESEARCH PROTOCOL: Natalee   ASSESSMENT: 67 y.o. [place] woman status post bilateral biopsies 01/26/2017, showing  (1) in the right breast, a complex sclerosing lesion  (2) in the left breast, a cT2 pN1 invasive ductal carcinoma (with some lobular features but E-cadherin positive), grade 2, estrogen and progesterone receptor positive, HER-2 not amplified, with an MIB-1 of 15-20%  (a) breast MRI 02/04/2017 suggests a T3 N1 tumor  (3) started neoadjuvant cyclophosphamide/docetaxel 03/03/2017, discontinued after 1 cycle with very poor tolerance  (a) started cyclophosphamide/methotrexate/fluorouracil (CMF) 03/28/2017, repeated x7 cycles, last dose 08/01/2017  (4) status post right lumpectomy and axillary lymph node dissection 09/25/17 for a ypT5 ypN2, stage IIIA invasive ductal carcinoma, grade 2, again estrogen and progesterone receptor positive, HER-2 not amplified  (5)  adjuvant radiation completed 12/21/2017  (a) capecitabine sensitization tolerated only the initial week of radiation (b) Site/dose: The patient initially received a dose of 50.4 Gy in 28 fractions to the left chest wall and left supraclavicular region. This was delivered using a 3-D conformal, 4 field technique. The patient then received a boost to the mastectomy scar. This delivered an additional 10 Gy in 5 fractions using an en face electron field. The total dose was 60.4 Gy.  ( 6) considering antiestrogens  (7) genetics testing 12/12/2017 through the Multi-Gene Panel offered by [facility] found no deleterious mutations in AIP, ALK, APC, ATM, AXIN2,BAP1,  BARD1, BLM, BMPR1A, BRCA1, BRCA2, BRIP1, CASR, CDC73, CDH1, CDK4, CDKN1B, CDKN1C, CDKN2A (p14ARF), CDKN2A (p16INK4a), CEBPA, CHEK2, CTNNA1, DICER1, DIS3L2, EGFR (c.2369C>T, p.Thr790Met variant only), EPCAM (Deletion/duplication testing only), FH, FLCN, GATA2, GPC3, GREM1 (Promoter region deletion/duplication testing only), HOXB13 (c.251G>A, p.Gly84Glu), HRAS, KIT, MAX, MEN1, MET, MITF (c.952G>A, p.Glu318Lys variant only), MLH1, MSH2, MSH3, MSH6, MUTYH, NBN, NF1, NF2, NTHL1, PALB2, PDGFRA, PHOX2B, PMS2, POLD1, POLE, POT1, PRKAR1A, PTCH1, PTEN, RAD50, RAD51C, RAD51D, RB1, RECQL4, RET, RUNX1, SDHAF2, SDHA (sequence changes only), SDHB, SDHC, SDHD, SMAD4, SMARCA4, SMARCB1, SMARCE1, STK11, SUFU, TERC, TERT, TMEM127, TP53, TSC1, TSC2, VHL, WRN and WT1.   (8) Foundation 1 testing shows stable microsatellite status and 1 mutation/ Mb, TP53 mutations x2  (a) PD-L1 dated July 2019 negative  PLAN: Alexys read the possible side effects of tamoxifen and got spooked.  I really thought I had given her the list of possible side effects before but we went over it in detail today and did list also includes all the side effects that are possible or likely or of concern regarding anastrozole.  I think she may end up doing better on anastrozole but of course she is concerned  about bone density issues.  She does have osteopenia.  We could deal with that by doing denosumab/Prolia but of course that has its own set of side effects.  She wanted to know if the chemotherapy was expected to have taking care of her cancer outside the  breast, and of course that was the chief intent.  Chemotherapy only treats cancer that is sensitive to chemotherapy.  Clearly much of her cancer was not sensitive to chemotherapy.  It would be sensitive to antiestrogens and that is why I am interested in her starting antiestrogens.  What she would like to do is discuss this with Dr. Phineas Real and then get started on either tamoxifen or anastrozole.  She will call us with which ever she decides.  I will see her again in February and we will troubleshoot side effects at that point  Once she is stably on and antiestrogens the plan will be for a minimum of 7 years  She knows to call for any other issues that may develop before the next visit.  Magrinat, Virgie Dad, MD  02/27/18 11:29 AM Medical Oncology and Hematology [facility] [address] [place], [place] [address] Tel. [phone]    Fax. [phone]   I, Jacqualyn Posey am acting as a Education administrator for Chauncey Cruel, MD.   I, Lurline Del MD, have reviewed the above documentation for accuracy and completeness, and I agree with the above.

## 2018-02-27 ENCOUNTER — Inpatient Hospital Stay: Payer: Medicare Other | Attending: Oncology | Admitting: Oncology

## 2018-02-27 VITALS — BP 120/92 | HR 88 | Temp 98.2°F | Resp 18 | Ht 69.0 in | Wt 245.7 lb

## 2018-02-27 DIAGNOSIS — Z79899 Other long term (current) drug therapy: Secondary | ICD-10-CM

## 2018-02-27 DIAGNOSIS — Z923 Personal history of irradiation: Secondary | ICD-10-CM | POA: Diagnosis not present

## 2018-02-27 DIAGNOSIS — Z9012 Acquired absence of left breast and nipple: Secondary | ICD-10-CM | POA: Insufficient documentation

## 2018-02-27 DIAGNOSIS — Z8049 Family history of malignant neoplasm of other genital organs: Secondary | ICD-10-CM | POA: Diagnosis not present

## 2018-02-27 DIAGNOSIS — M8589 Other specified disorders of bone density and structure, multiple sites: Secondary | ICD-10-CM

## 2018-02-27 DIAGNOSIS — Z803 Family history of malignant neoplasm of breast: Secondary | ICD-10-CM | POA: Insufficient documentation

## 2018-02-27 DIAGNOSIS — Z8042 Family history of malignant neoplasm of prostate: Secondary | ICD-10-CM | POA: Diagnosis not present

## 2018-02-27 DIAGNOSIS — Z808 Family history of malignant neoplasm of other organs or systems: Secondary | ICD-10-CM | POA: Diagnosis not present

## 2018-02-27 DIAGNOSIS — C50212 Malignant neoplasm of upper-inner quadrant of left female breast: Secondary | ICD-10-CM | POA: Insufficient documentation

## 2018-02-27 DIAGNOSIS — Z79810 Long term (current) use of selective estrogen receptor modulators (SERMs): Secondary | ICD-10-CM | POA: Diagnosis not present

## 2018-02-27 DIAGNOSIS — Z170 Estrogen receptor positive status [ER+]: Secondary | ICD-10-CM

## 2018-02-27 DIAGNOSIS — D0361 Melanoma in situ of right upper limb, including shoulder: Secondary | ICD-10-CM

## 2018-02-27 DIAGNOSIS — Z800 Family history of malignant neoplasm of digestive organs: Secondary | ICD-10-CM

## 2018-02-27 DIAGNOSIS — C50812 Malignant neoplasm of overlapping sites of left female breast: Secondary | ICD-10-CM

## 2018-03-01 ENCOUNTER — Telehealth: Payer: Self-pay | Admitting: Oncology

## 2018-03-01 NOTE — Telephone Encounter (Signed)
Per 12/31 no los °

## 2018-03-02 ENCOUNTER — Institutional Professional Consult (permissible substitution): Payer: Medicare Other | Admitting: Gynecology

## 2018-03-06 ENCOUNTER — Encounter: Payer: Self-pay | Admitting: Gynecology

## 2018-03-06 ENCOUNTER — Ambulatory Visit: Payer: Medicare Other | Admitting: Gynecology

## 2018-03-06 VITALS — BP 154/82

## 2018-03-06 DIAGNOSIS — Z853 Personal history of malignant neoplasm of breast: Secondary | ICD-10-CM

## 2018-03-06 DIAGNOSIS — Z79899 Other long term (current) drug therapy: Secondary | ICD-10-CM

## 2018-03-06 NOTE — Patient Instructions (Signed)
Follow-up if you have any further issues.  Schedule an annual exam

## 2018-03-06 NOTE — Progress Notes (Signed)
    Brooke [name] 02/09/1951 [id number]        68 y.o.  G0P0000 presents to discuss medication management for her breast cancer.  Has completed her primary treatments and Dr. [name] had discussed with her starting on tamoxifen.  She read through some of the risk literature and was fearful about certain items.  She discussed this with him and he discussed alternative treatment with anastrozole.  She wants my input into this decision  Past medical history,surgical history, problem list, medications, allergies, family history and social history were all reviewed and documented in the EPIC chart.  Directed ROS with pertinent positives and negatives documented in the history of present illness/assessment and plan.  Exam: Vitals:   03/06/18 1222  BP: (!) 154/82   General appearance:  Normal   Assessment/Plan:  68 y.o. G0P0000 with a history of breast cancer initiating treatment with either tamoxifen or anastrozole.  She is read through the risks of tamoxifen as provided by Dr. [name] to include thrombosis, cataracts, endometrial carcinoma which were the most concerning to her.  Her sister has stage IV endometrial carcinoma and the patient has leiomyoma.  She was tested genetically on an expanded panel including 40+ genes and was negative.  She read through her risks of anastrozole as provided by Dr. [name] to include accelerated bone loss, menopausal symptoms, arthralgias/myalgias.  We discussed in detail the mechanism of action of tamoxifen and anastrozole and how they both decrease estrogen stimulation to the breast but work through different pathways.  We discussed possible endometrial stimulation with increased risk of hyperplasia/carcinoma.  We discussed the monitoring to include endometrial biopsies, ultrasounds or expectant management with reporting of any bleeding.  We discussed ways to minimize her risks such as increasing her cardiovascular health with exercising on a regular basis and  weight control which would minimize her risk of thrombosis/cardiovascular disease.  She understands I am making no recommendation as which drug to pick as far as effectiveness for the breast cancer and that Dr. [name] is the expert as far as this is concerned.  After lengthy discussion it appears the patient is going to start on tamoxifen as prescribed by Dr. [name].  She knows to call me if she has any bleeding.  She will call Dr. [name] if she has any other significant symptoms.  Lastly we discussed her elevated blood pressure of 154/82.  She notes that it has been elevated several times recently on office checks.  Recommended she obtain a cuff at home and to monitor her blood pressure and if it remains elevated that she should follow-up with her primary provider.  She is going to make an appointment to see me for annual exam as she is overdue.  I spent a total of 25 face-to-face minutes with the patient, over 50% was spent counseling and coordination of care.     Anastasio Auerbach MD, 2:06 PM 03/06/2018

## 2018-03-07 ENCOUNTER — Telehealth: Payer: Self-pay

## 2018-03-07 NOTE — Telephone Encounter (Signed)
Patient called to report decision on antiestrogen therapy.  Patient voiced she will be starting the Tamoxifen, has Rx at home.  Will inform provider of decision.  No further needs at this time.

## 2018-03-15 ENCOUNTER — Telehealth: Payer: Self-pay | Admitting: Oncology

## 2018-03-15 NOTE — Telephone Encounter (Signed)
Due to [facility] training and [facility] 2/12 SCP visit moved from 1/30 to 5/21 per [place] and 2/12 lab/fu moved from AM to PM. Left message for patient. Schedule mailed.

## 2018-03-26 ENCOUNTER — Telehealth: Payer: Self-pay | Admitting: Oncology

## 2018-03-26 NOTE — Telephone Encounter (Signed)
[place] moved to pm. Left patient message. Mailed out new schedule

## 2018-03-29 ENCOUNTER — Inpatient Hospital Stay: Payer: Medicare Other | Admitting: Adult Health

## 2018-04-10 NOTE — Progress Notes (Signed)
[place]  [phone] [phone]    ID: Brooke [name] DOB: 05/14/1950  MR#: [id number]  HLK#:[id number]  Patient Care Team: [name], DO as PCP - General (Family Medicine) Phineas Real, Belinda Block, MD (Obstetrics and Gynecology) Rolm Bookbinder, MD as Attending Physician (Dermatology) , Virgie Dad, MD as Consulting Physician (Oncology) Kyung Rudd, MD as Consulting Physician (Radiation Oncology) Jovita Kussmaul, MD as Consulting Physician (General Surgery) Irene Limbo, MD as Consulting Physician (Plastic Surgery) Delice Bison, Charlestine Massed, NP as Nurse Practitioner (Hematology and Oncology) OTHER MD:   CHIEF COMPLAINT: Estrogen receptor positive breast cancer  CURRENT TREATMENT: Tamoxifen   HISTORY OF CURRENT ILLNESS: From the original intake note:  Brooke [name] herself palpated a change in her left breast and brought this to medical attention.  On 01/18/2017 she underwent bilateral diagnostic mammography with tomography and bilateral breast ultrasonography at [facility].  The breast density was category B.  At the most recent exam was from December 2016.  In the right breast upper outer quadrant there was a 1 cm irregular mass associated with punctate calcification.  By ultrasound this was not well-defined, and it was biopsied on 01/26/2017 with tomographic guidance this showed a complex sclerosing lesion with the usual ductal hyperplasia.  The right axilla was sonographically  On the left mammography showed a 4 cm dense mass with indistinct margins in the upper inner quadrant.  Ultrasound defined a 3 cm irregular mass in the upper inner quadrant of the left breast which was palpable.  There was an abnormal lymph node in the left axilla.  Biopsy of the left mass and abnormal lymph node ([id number], on 01/26/2017) showed both to be involved by invasive ductal carcinoma, with some lobular features but E-cadherin positive, estrogen and progesterone  receptors both 100% positive, with strong staining intensity, with MIB-1 between 15-20%.  There was no HER-2 amplification, the signals ratio being 1.31/1.58 and the number per cell 2.30/2.05  The patient's subsequent history is as detailed below.   Brooke [name] returns today for follow-up and treatment of her estrogen receptor positive breast cancer.  After her last visit here she met with Dr. Phineas Real and further discussed antiestrogens.  In the end she decided to try tamoxifen.  She continues on Tamoxifen, which she started on 03/05/2018. She has had some hot flashes with diaphoresis. She has had no leg cramping and no headaches.  Since her last visit here, she has not undergone any additional studies.     REVIEW OF SYSTEMS: Brooke [name] has noticed some slight dizziness once or twice, which she is unsure whether to attribute to the treatment or not. She is having some issues with her right ear, and she feels like it's clogged, however. For exercise, she has not been as active recently due to the pain caused by her osteoarthritus in her knees. If she didn't have any pain, she would utilize her silver sneakers membership. She still has some residual tenderness from tamoxifen. She also notes that she would like to have more mobility in her arms from surgery. The patient denies unusual headaches, visual changes, nausea, vomiting, or dizziness. There has been no unusual cough, phlegm production, or pleurisy. This been no change in bowel or bladder habits. The patient denies unexplained fatigue or unexplained weight loss, bleeding, rash, or fever. A detailed review of systems was otherwise noncontributory.    PAST MEDICAL HISTORY: Past Medical History:  Diagnosis Date  . Anemia   . Anxiety   .  Arthritis   . Bilateral dry eyes   . Breast cancer, left breast ([place])   . Family history of breast cancer 02/02/2017  . Family history of breast cancer   . Family history of colon cancer 02/02/2017  .  Family history of colon cancer   . Family history of prostate cancer   . Family history of prostate cancer in father 02/02/2017  . Family history of uterine cancer   . Fibroid   . History of kidney stones   . Hyperlipidemia   . Kidney stones   . Melanoma of upper arm ([place]) 2009   RIGHT ARM   . Obesity (BMI 30.0-34.9)   . Osteopenia 09/2017   T score -1.9 max 8% / 0.8% statistically significant decline at right and left hip stable at the spine  . PONV (postoperative nausea and vomiting)   . Squamous carcinoma     of the skin  . UTI (urinary tract infection)   . Varicose veins of both lower extremities   . Vitamin D deficiency 07/2009   LOW VITAMIN D 29  . Vitamin deficiency 07/2008   VITAMIN D LOW 23    PAST SURGICAL HISTORY: Past Surgical History:  Procedure Laterality Date  . ABDOMINAL SURGERY  1975   ovairan cyst   . APPENDECTOMY    . BREAST LUMPECTOMY WITH RADIOACTIVE SEED LOCALIZATION Right 09/25/2017   Procedure: RIGHT BREAST LUMPECTOMY WITH RADIOACTIVE SEED LOCALIZATION;  Surgeon: Jovita Kussmaul, MD;  Location: [facility];  Service: General;  Laterality: Right;  . BREAST SURGERY     bio  . CHOLECYSTECTOMY    . DERMOID CYST  EXCISION    . DILATION AND CURETTAGE OF UTERUS  1998  . GALLBLADDER SURGERY    . HYSTEROSCOPY    . insertion  port a cath  03/02/2016  . MASTECTOMY MODIFIED RADICAL Left 09/25/2017   Procedure: LEFT MODIFIED RADICAL MASTECTOMY;  Surgeon: Jovita Kussmaul, MD;  Location: [facility];  Service: General;  Laterality: Left;  . PORTACATH PLACEMENT N/A 03/02/2017   Procedure: INSERTION PORT-A-CATH;  Surgeon: Jovita Kussmaul, MD;  Location: [facility];  Service: General;  Laterality: N/A;  . SKIN BIOPSY    . SKIN CANCER EXCISION  2010,2011   2010-RIGHT ARM, 2011-LEFT LOWER LEG.-DR Rolm Bookbinder DERM. DR. Sarajane Jews IS HER DERM SURGEON.    FAMILY HISTORY Family History  Problem Relation Age of Onset  . Diabetes Father   . Hypertension Father   . Heart disease Father   .  Prostate cancer Father 12  . Breast cancer Sister 88       again at age 71, GT results unk  . Skin cancer Sister   . Colon cancer Paternal Grandfather 34  . Hypertension Mother   . Heart failure Mother   . Uterine cancer Sister 44       stage IV  . Breast cancer Cousin        mat first cousin dx under 78   The patient's father was diagnosed with prostate cancer in his 35s and died at age 42 from complications of that disease.  The patient's mother died at age 58 with congestive heart failure.  The patient had no brothers, 3 sisters.  One sister had breast cancer at age 68 and again at age 81.  She has been genetically tested and was found to be BRCA not mutated in addition a paternal grandfather had colon cancer in his 64s.  There is no history of ovarian cancer  in the family to the patient's knowledge.   GYNECOLOGIC HISTORY:  Patient's last menstrual period was 03/18/1997. Menarche age 73. The patient is GX P0.  She stopped having periods in 1999 and took hormone replacement approximately 5 years.   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  She is now retired.  She is single and lives alone with no pets.    ADVANCED DIRECTIVES:    HEALTH MAINTENANCE: Social History   Tobacco Use  . Smoking status: Never Smoker  . Smokeless tobacco: Never Used  Substance Use Topics  . Alcohol use: No    Frequency: Never    Comment: rare  . Drug use: No    Colonoscopy: Never  PAP:  Bone density: 2017/osteopenia   Allergies  Allergen Reactions  . Neosporin [Neomycin-Bacitracin Zn-Polymyx] Swelling, Rash and Other (See Comments)    Blisters  . Benzalkonium Chloride Other (See Comments)    Redness (Pt is unaware)  It works by killing microorganisms and inhibiting their future growth, and for this reason frequently appears as an ingredient in antibacterial hand wipes,  antiseptic creams and anti-itch ointments.  . Latex Rash    Current Outpatient Medications  Medication Sig Dispense Refill  . acetaminophen (TYLENOL) 500 MG tablet Take 500 mg by mouth every 6 (six) hours as needed for moderate pain or headache.    . alclomethasone (ACLOVATE) 0.05 % cream Apply to affected area twice daily as needed for dermatitis  0  . b complex vitamins tablet Take 1 tablet by mouth daily.    . betamethasone dipropionate (DIPROLENE) 0.05 % cream APPLY THIN LAYER BID FOR 3 TO 4 WEEKS  0  . ibuprofen (ADVIL,MOTRIN) 200 MG tablet Take 200 mg by mouth every 6 (six) hours as needed for headache or moderate pain.    Marland Kitchen Lifitegrast (XIIDRA) 5 % SOLN Place 1 drop into both eyes daily as needed (dry eyes).    . Propylene Glycol (SYSTANE COMPLETE) 0.6 % SOLN Place 1 drop into both eyes daily.    . vitamin C (ASCORBIC ACID) 500 MG tablet Take 500 mg by mouth daily.    Marland Kitchen VITAMIN D, CHOLECALCIFEROL, PO Take 6 drops by mouth every other day. 1 drop = 1000 international units     No current facility-administered medications for this visit.     OBJECTIVE: Middle-aged white woman who appears well  Vitals:   04/11/18 1440  BP: (!) 162/91  Pulse: 71  Resp: 18  Temp: 98.7 F (37.1 C)  SpO2: 98%     Body mass index is 37.6 kg/m.   Wt Readings from Last 3 Encounters:  04/11/18 254 lb 9.6 oz (115.5 kg)  02/27/18 245 lb 11.2 oz (111.4 kg)  02/05/18 244 lb 6.4 oz (110.9 kg)  ECOG FS:1 - Symptomatic but completely ambulatory  Sclerae unicteric, EOMs intact Oropharynx clear and moist No cervical or supraclavicular adenopathy Lungs no rales or rhonchi Heart regular rate and rhythm Abd soft, nontender, positive bowel sounds MSK no focal spinal tenderness, no upper extremity lymphedema Neuro: nonfocal, well oriented, appropriate affect Breasts: The right breast is status post benign lumpectomy.  The left breast is status post mastectomy and radiation.  There is no evidence of local  recurrence.  Both axillae are benign.  LAB RESULTS:  CMP     Component Value Date/Time   NA 139 04/11/2018 1424   NA 141 03/03/2017 1358  K 3.9 04/11/2018 1424   K 3.3 (L) 03/03/2017 1358   CL 105 04/11/2018 1424   CO2 24 04/11/2018 1424   CO2 28 03/03/2017 1358   GLUCOSE 102 (H) 04/11/2018 1424   GLUCOSE 107 03/03/2017 1358   BUN 14 04/11/2018 1424   BUN 12.7 03/03/2017 1358   CREATININE 0.82 04/11/2018 1424   CREATININE 0.79 07/11/2017 1040   CREATININE 0.9 03/03/2017 1358   CALCIUM 9.1 04/11/2018 1424   CALCIUM 9.4 03/03/2017 1358   PROT 6.9 04/11/2018 1424   PROT 6.5 03/03/2017 1358   ALBUMIN 4.0 04/11/2018 1424   ALBUMIN 3.7 03/03/2017 1358   AST 16 04/11/2018 1424   AST 17 07/11/2017 1040   AST 23 03/03/2017 1358   ALT 16 04/11/2018 1424   ALT 19 07/11/2017 1040   ALT 25 03/03/2017 1358   ALKPHOS 80 04/11/2018 1424   ALKPHOS 71 03/03/2017 1358   BILITOT 0.5 04/11/2018 1424   BILITOT 0.6 07/11/2017 1040   BILITOT 0.52 03/03/2017 1358   GFRNONAA >60 04/11/2018 1424   GFRNONAA >60 07/11/2017 1040   GFRAA >60 04/11/2018 1424   GFRAA >60 07/11/2017 1040    No results found for: TOTALPROTELP, ALBUMINELP, A1GS, A2GS, BETS, BETA2SER, GAMS, MSPIKE, SPEI  No results found for: KPAFRELGTCHN, LAMBDASER, [facility]  Lab Results  Component Value Date   WBC 4.1 12/20/2017   NEUTROABS 3.0 12/20/2017   HGB 13.1 12/20/2017   HCT 39.8 12/20/2017   MCV 89.0 12/20/2017   PLT 232 12/20/2017      Chemistry      Component Value Date/Time   NA 139 04/11/2018 1424   NA 141 03/03/2017 1358   K 3.9 04/11/2018 1424   K 3.3 (L) 03/03/2017 1358   CL 105 04/11/2018 1424   CO2 24 04/11/2018 1424   CO2 28 03/03/2017 1358   BUN 14 04/11/2018 1424   BUN 12.7 03/03/2017 1358   CREATININE 0.82 04/11/2018 1424   CREATININE 0.79 07/11/2017 1040   CREATININE 0.9 03/03/2017 1358      Component Value Date/Time   CALCIUM 9.1 04/11/2018 1424   CALCIUM 9.4 03/03/2017 1358    ALKPHOS 80 04/11/2018 1424   ALKPHOS 71 03/03/2017 1358   AST 16 04/11/2018 1424   AST 17 07/11/2017 1040   AST 23 03/03/2017 1358   ALT 16 04/11/2018 1424   ALT 19 07/11/2017 1040   ALT 25 03/03/2017 1358   BILITOT 0.5 04/11/2018 1424   BILITOT 0.6 07/11/2017 1040   BILITOT 0.52 03/03/2017 1358       No results found for: LABCA2  No components found for: [id number]  No results for input(s): INR in the last 168 hours.  No results found for: LABCA2  No results found for: HYW737  No results found for: TGG269  No results found for: SWN462  No results found for: CA2729  No components found for: HGQUANT  No results found for: CEA1 / No results found for: CEA1   No results found for: AFPTUMOR  No results found for: CHROMOGRNA  No results found for: PSA1  Appointment on 04/11/2018  Component Date Value Ref Range Status  . Sodium 04/11/2018 139  135 - 145 mmol/L Final  . Potassium 04/11/2018 3.9  3.5 - 5.1 mmol/L Final  . Chloride 04/11/2018 105  98 - 111 mmol/L Final  . CO2 04/11/2018 24  22 - 32 mmol/L Final  . Glucose, Bld 04/11/2018 102* 70 - 99 mg/dL Final  . BUN 04/11/2018 14  8 - 23 mg/dL Final  . Creatinine, Ser 04/11/2018 0.82  0.44 - 1.00 mg/dL Final  . Calcium 04/11/2018 9.1  8.9 - 10.3 mg/dL Final  . Total Protein 04/11/2018 6.9  6.5 - 8.1 g/dL Final  . Albumin 04/11/2018 4.0  3.5 - 5.0 g/dL Final  . AST 04/11/2018 16  15 - 41 U/L Final  . ALT 04/11/2018 16  0 - 44 U/L Final  . Alkaline Phosphatase 04/11/2018 80  38 - 126 U/L Final  . Total Bilirubin 04/11/2018 0.5  0.3 - 1.2 mg/dL Final  . GFR calc non Af Amer 04/11/2018 >60  >60 mL/min Final  . GFR calc Af Amer 04/11/2018 >60  >60 mL/min Final  . Anion gap 04/11/2018 10  5 - 15 Final   Performed at [facility], [address] [place], [place] [address]    (this displays the last labs from the last 3 days)  No results found for: TOTALPROTELP, ALBUMINELP, A1GS, A2GS, BETS,  BETA2SER, GAMS, MSPIKE, SPEI (this displays SPEP labs)  No results found for: KPAFRELGTCHN, LAMBDASER, KAPLAMBRATIO (kappa/lambda light chains)  No results found for: HGBA, HGBA2QUANT, HGBFQUANT, HGBSQUAN (Hemoglobinopathy evaluation)   No results found for: LDH  No results found for: IRON, TIBC, IRONPCTSAT (Iron and TIBC)  No results found for: FERRITIN  Urinalysis    Component Value Date/Time   COLORURINE YELLOW 05/29/2015 [place] 05/29/2015 1113   LABSPEC 1.023 05/29/2015 1113   PHURINE 5.5 05/29/2015 1113   GLUCOSEU NEGATIVE 05/29/2015 1113   GLUCOSEU NEGATIVE 11/05/2012 1559   HGBUR NEGATIVE 05/29/2015 1113   BILIRUBINUR negative 10/05/2017 1710   KETONESUR NEGATIVE 05/29/2015 1113   PROTEINUR Negative 10/05/2017 1710   PROTEINUR NEGATIVE 05/29/2015 1113   UROBILINOGEN 0.2 10/05/2017 1710   UROBILINOGEN 0.2 11/05/2012 1559   NITRITE negative 10/05/2017 1710   NITRITE NEGATIVE 05/29/2015 1113   LEUKOCYTESUR Moderate (2+) (A) 10/05/2017 1710     STUDIES: No results found.   ELIGIBLE FOR AVAILABLE RESEARCH PROTOCOL: Natalee   ASSESSMENT: 68 y.o. [place] woman status post bilateral biopsies 01/26/2017, showing  (1) in the right breast, a complex sclerosing lesion, status post lumpectomy 09/25/2017, with no malignancy identified.  (2) in the left breast, a cT2 pN1 invasive ductal carcinoma (with some lobular features but E-cadherin positive), grade 2, estrogen and progesterone receptor positive, HER-2 not amplified, with an MIB-1 of 15-20%  (a) breast MRI 02/04/2017 suggests a T3 N1 tumor  (3) started neoadjuvant cyclophosphamide/docetaxel 03/03/2017, discontinued after 1 cycle with very poor tolerance  (a) started cyclophosphamide/methotrexate/fluorouracil (CMF) 03/28/2017, repeated x7 cycles, last dose 08/01/2017  (4) status post left lumpectomy and axillary lymph node dissection 09/25/17 for a ypT5 ypN2, stage IIIA invasive ductal  carcinoma, grade 2, again estrogen and progesterone receptor positive, HER-2 not amplified  (5) adjuvant radiation completed 12/21/2017  (a) capecitabine sensitization tolerated only the initial week of radiation (b) Site/dose: The patient initially received a dose of 50.4 Gy in 28 fractions to the left chest wall and left supraclavicular region. This was delivered using a 3-D conformal, 4 field technique. The patient then received a boost to the mastectomy scar. This delivered an additional 10 Gy in 5 fractions using an en face electron field. The total dose was 60.4 Gy.  ( 6) tamoxifen started 03/09/2018  (7) genetics testing 12/12/2017 through the Multi-Gene Panel offered by [facility] found no deleterious mutations in AIP, ALK, APC, ATM, AXIN2,BAP1,  BARD1, BLM, BMPR1A, BRCA1, BRCA2, BRIP1, CASR,  CDC73, CDH1, CDK4, CDKN1B, CDKN1C, CDKN2A (p14ARF), CDKN2A (p16INK4a), CEBPA, CHEK2, CTNNA1, DICER1, DIS3L2, EGFR (c.2369C>T, p.Thr790Met variant only), EPCAM (Deletion/duplication testing only), FH, FLCN, GATA2, GPC3, GREM1 (Promoter region deletion/duplication testing only), HOXB13 (c.251G>A, p.Gly84Glu), HRAS, KIT, MAX, MEN1, MET, MITF (c.952G>A, p.Glu318Lys variant only), MLH1, MSH2, MSH3, MSH6, MUTYH, NBN, NF1, NF2, NTHL1, PALB2, PDGFRA, PHOX2B, PMS2, POLD1, POLE, POT1, PRKAR1A, PTCH1, PTEN, RAD50, RAD51C, RAD51D, RB1, RECQL4, RET, RUNX1, SDHAF2, SDHA (sequence changes only), SDHB, SDHC, SDHD, SMAD4, SMARCA4, SMARCB1, SMARCE1, STK11, SUFU, TERC, TERT, TMEM127, TP53, TSC1, TSC2, VHL, WRN and WT1.   (8) Foundation 1 testing shows stable microsatellite status and 1 mutation/ Mb, TP53 mutations x2  (a) PD-L1 dated July 2019 negative   PLAN: Brooke [name] is now just over 6 months out from definitive surgery for her breast cancer.  There is no evidence of recurrent or residual disease and she is largely recovered from the toxicities of her treatment.  She is tolerating tamoxifen well and the plan will be to  continue that a minimum of 5 years.  We reviewed the fact that she really does need to continue to do left upper extremity stretching exercises if she does not want to end up with a frozen shoulder.  It will also be helpful if she exercises on a regular basis.  [facility]  She does have silver sneakers.  She had many questions regarding supplements.  She is thinking of taking turmeric for her aches and pains.  I think that is fine but regular exercise and stretching probably would be just as good and overall better  She will have mammography within the next week or so and she will return to see me in July.  She knows to call for any other issue that may develop before next visit.   Artur Winningham, Virgie Dad, MD  04/11/18 3:19 PM Medical Oncology and Hematology [facility] [address] [place], [place] [address] Tel. [phone]    Fax. [phone]  I, Jacqualyn Posey am acting as a Education administrator for Chauncey Cruel, MD.   I, Lurline Del MD, have reviewed the above documentation for accuracy and completeness, and I agree with the above.

## 2018-04-11 ENCOUNTER — Inpatient Hospital Stay: Payer: Medicare Other | Admitting: Oncology

## 2018-04-11 ENCOUNTER — Telehealth: Payer: Self-pay | Admitting: Oncology

## 2018-04-11 ENCOUNTER — Inpatient Hospital Stay: Payer: Medicare Other | Attending: Oncology

## 2018-04-11 VITALS — BP 162/91 | HR 71 | Temp 98.7°F | Resp 18 | Ht 69.0 in | Wt 254.6 lb

## 2018-04-11 DIAGNOSIS — Z9012 Acquired absence of left breast and nipple: Secondary | ICD-10-CM

## 2018-04-11 DIAGNOSIS — Z170 Estrogen receptor positive status [ER+]: Secondary | ICD-10-CM

## 2018-04-11 DIAGNOSIS — Z8042 Family history of malignant neoplasm of prostate: Secondary | ICD-10-CM | POA: Diagnosis not present

## 2018-04-11 DIAGNOSIS — C50212 Malignant neoplasm of upper-inner quadrant of left female breast: Secondary | ICD-10-CM | POA: Insufficient documentation

## 2018-04-11 DIAGNOSIS — Z791 Long term (current) use of non-steroidal anti-inflammatories (NSAID): Secondary | ICD-10-CM | POA: Diagnosis not present

## 2018-04-11 DIAGNOSIS — Z803 Family history of malignant neoplasm of breast: Secondary | ICD-10-CM

## 2018-04-11 DIAGNOSIS — Z79899 Other long term (current) drug therapy: Secondary | ICD-10-CM | POA: Insufficient documentation

## 2018-04-11 DIAGNOSIS — F419 Anxiety disorder, unspecified: Secondary | ICD-10-CM

## 2018-04-11 DIAGNOSIS — Z800 Family history of malignant neoplasm of digestive organs: Secondary | ICD-10-CM

## 2018-04-11 DIAGNOSIS — Z79810 Long term (current) use of selective estrogen receptor modulators (SERMs): Secondary | ICD-10-CM | POA: Diagnosis not present

## 2018-04-11 DIAGNOSIS — Z8049 Family history of malignant neoplasm of other genital organs: Secondary | ICD-10-CM

## 2018-04-11 DIAGNOSIS — Z923 Personal history of irradiation: Secondary | ICD-10-CM | POA: Diagnosis not present

## 2018-04-11 DIAGNOSIS — C50812 Malignant neoplasm of overlapping sites of left female breast: Secondary | ICD-10-CM

## 2018-04-11 DIAGNOSIS — D0361 Melanoma in situ of right upper limb, including shoulder: Secondary | ICD-10-CM

## 2018-04-11 LAB — COMPREHENSIVE METABOLIC PANEL
ALT: 16 U/L (ref 0–44)
AST: 16 U/L (ref 15–41)
Albumin: 4 g/dL (ref 3.5–5.0)
Alkaline Phosphatase: 80 U/L (ref 38–126)
Anion gap: 10 (ref 5–15)
BUN: 14 mg/dL (ref 8–23)
CHLORIDE: 105 mmol/L (ref 98–111)
CO2: 24 mmol/L (ref 22–32)
CREATININE: 0.82 mg/dL (ref 0.44–1.00)
Calcium: 9.1 mg/dL (ref 8.9–10.3)
GFR calc non Af Amer: >60 mL/min (ref >60)
Glucose, Bld: 102 mg/dL — ABNORMAL HIGH (ref 70–99)
Potassium: 3.9 mmol/L (ref 3.5–5.1)
SODIUM: 139 mmol/L (ref 135–145)
Total Bilirubin: 0.5 mg/dL (ref 0.3–1.2)
Total Protein: 6.9 g/dL (ref 6.5–8.1)

## 2018-04-11 NOTE — Telephone Encounter (Signed)
Gave avs and calendar ° °

## 2018-05-03 ENCOUNTER — Encounter: Payer: Self-pay | Admitting: Gynecology

## 2018-05-28 ENCOUNTER — Telehealth: Payer: Self-pay | Admitting: *Deleted

## 2018-05-28 NOTE — Telephone Encounter (Signed)
Copied from [place] [id number]. Topic: General - Other >> May 28, 2018  3:30 PM Wynetta Emery, [place] C wrote: Reason for CRM: pt called in because she received Dr. Noel Journey letter. Pt says that it is important to her that she speaks with Dr. Maudie Mercury before TOC. Pt would like to know if PCP could call her?   CB: [phone]-  pt says that she has been having a concern with her phone, incase of no answer.

## 2018-05-29 NOTE — Telephone Encounter (Signed)
I called the pt and scheduled an appt for 5/27 with Dr Ethlyn Gallery.

## 2018-05-29 NOTE — Telephone Encounter (Signed)
Spoke with pt. Please set up TOC visit with Dr. Ethlyn Gallery in 2-4 months. She knows she can call I interim if needs anything.

## 2018-06-18 ENCOUNTER — Telehealth: Payer: Self-pay | Admitting: Adult Health

## 2018-06-18 NOTE — Telephone Encounter (Signed)
Rescheduled 5/21 to 5/20 per sch msg and changed to webex. Called patient. No answer. Mailbox full, could not leave msg.

## 2018-07-17 ENCOUNTER — Encounter: Payer: Self-pay | Admitting: Physical Therapy

## 2018-07-17 ENCOUNTER — Telehealth: Payer: Self-pay | Admitting: Adult Health

## 2018-07-17 ENCOUNTER — Other Ambulatory Visit: Payer: Self-pay

## 2018-07-17 ENCOUNTER — Ambulatory Visit: Payer: Medicare Other | Attending: General Surgery | Admitting: Physical Therapy

## 2018-07-17 DIAGNOSIS — Z483 Aftercare following surgery for neoplasm: Secondary | ICD-10-CM

## 2018-07-17 DIAGNOSIS — Z170 Estrogen receptor positive status [ER+]: Secondary | ICD-10-CM | POA: Diagnosis present

## 2018-07-17 DIAGNOSIS — C50212 Malignant neoplasm of upper-inner quadrant of left female breast: Secondary | ICD-10-CM | POA: Diagnosis present

## 2018-07-17 DIAGNOSIS — L599 Disorder of the skin and subcutaneous tissue related to radiation, unspecified: Secondary | ICD-10-CM | POA: Diagnosis present

## 2018-07-17 DIAGNOSIS — M25612 Stiffness of left shoulder, not elsewhere classified: Secondary | ICD-10-CM | POA: Insufficient documentation

## 2018-07-17 DIAGNOSIS — R293 Abnormal posture: Secondary | ICD-10-CM

## 2018-07-17 NOTE — Telephone Encounter (Signed)
Called regarding upcoming Webex appointment, patient does not have access for Webex and needs this to be a telephone visit. °

## 2018-07-17 NOTE — Therapy (Signed)
[place], [place], [address] Phone: [phone]   Fax:  [phone]  Physical Therapy Evaluation  Patient Details  Name: Brooke [name] MRN: [id number] Date of Birth: 08-08-1950 Referring Provider (PT): Dr. Autumn Messing   Encounter Date: 07/17/2018  PT End of Session - 07/17/18 1854    Visit Number  1    Number of Visits  9    Date for PT Re-Evaluation  08/17/18    PT Start Time  1500    PT Stop Time  1545    PT Time Calculation (min)  45 min    Activity Tolerance  Patient tolerated treatment well    Behavior During Therapy  [facility] for tasks assessed/performed       Past Medical History:  Diagnosis Date  . Anemia   . Anxiety   . Arthritis   . Bilateral dry eyes   . Breast cancer, left breast ([place])   . Family history of breast cancer 02/02/2017  . Family history of breast cancer   . Family history of colon cancer 02/02/2017  . Family history of colon cancer   . Family history of prostate cancer   . Family history of prostate cancer in father 02/02/2017  . Family history of uterine cancer   . Fibroid   . History of kidney stones   . Hyperlipidemia   . Kidney stones   . Melanoma of upper arm ([place]) 2009   RIGHT ARM   . Obesity (BMI 30.0-34.9)   . Osteopenia 09/2017   T score -1.9 max 8% / 0.8% statistically significant decline at right and left hip stable at the spine  . PONV (postoperative nausea and vomiting)   . Squamous carcinoma     of the skin  . UTI (urinary tract infection)   . Varicose veins of both lower extremities   . Vitamin D deficiency 07/2009   LOW VITAMIN D 29  . Vitamin deficiency 07/2008   VITAMIN D LOW 23    Past Surgical History:  Procedure Laterality Date  . ABDOMINAL SURGERY  1975   ovairan cyst   . APPENDECTOMY    . BREAST LUMPECTOMY WITH RADIOACTIVE SEED LOCALIZATION Right 09/25/2017   Procedure: RIGHT BREAST LUMPECTOMY WITH RADIOACTIVE SEED LOCALIZATION;   Surgeon: Jovita Kussmaul, MD;  Location: [facility];  Service: General;  Laterality: Right;  . BREAST SURGERY     bio  . CHOLECYSTECTOMY    . DERMOID CYST  EXCISION    . DILATION AND CURETTAGE OF UTERUS  1998  . GALLBLADDER SURGERY    . HYSTEROSCOPY    . insertion  port a cath  03/02/2016  . MASTECTOMY MODIFIED RADICAL Left 09/25/2017   Procedure: LEFT MODIFIED RADICAL MASTECTOMY;  Surgeon: Jovita Kussmaul, MD;  Location: [facility];  Service: General;  Laterality: Left;  . PORTACATH PLACEMENT N/A 03/02/2017   Procedure: INSERTION PORT-A-CATH;  Surgeon: Jovita Kussmaul, MD;  Location: [facility];  Service: General;  Laterality: N/A;  . SKIN BIOPSY    . SKIN CANCER EXCISION  2010,2011   2010-RIGHT ARM, 2011-LEFT LOWER LEG.-DR Rolm Bookbinder DERM. DR. Sarajane Jews IS HER DERM SURGEON.    There were no vitals filed for this visit.   Subjective Assessment - 07/17/18 1847    Subjective  Pt reports she has started doing exercises involving repetitive moments of her arms and started feeling tightness down her left arm to her wrist "like a tight rubber band"  She  had axialllary cording after her surgery.  She also feels tightness and cording in her left axilla and knows her left shoulder is tighter than it was     Pertinent History  Neoadjuvant chemotherapy early 2019; left mastectomy and ALND (9/13 nodes positive) on 09/25/17. Pt also had 33 treatments of radiation and previous PT .  She does not have a compression bra . She has a compression sleeve but she has not worn it.  She is limited in walking by painful knees     Patient Stated Goals  to get rid of the cording in her arm and learn an exercise program so she can go to the gym when they reopen    Currently in Pain?  Yes    Pain Score  --   did not rate   Pain Location  Arm    Pain Orientation  Left    Pain Descriptors / Indicators  Tightness    Pain Type  Acute pain    Pain Onset  1 to 4 weeks ago    Pain Frequency  Intermittent    Aggravating Factors    stretching left arm out shoulder raised, elbow straight     Pain Relieving Factors  bending arm          [facility] PT Assessment - 07/17/18 0001      Assessment   Medical Diagnosis  s/p left mastectomy and ALND    Referring Provider (PT)  Dr. Autumn Messing    Onset Date/Surgical Date  09/25/17    Hand Dominance  Left    Prior Therapy  None      Precautions   Precautions  Other (comment)    Precaution Comments  Left arm lymphedema risk      Restrictions   Weight Bearing Restrictions  No      Balance Screen   Has the patient fallen in the past 6 months  No    Has the patient had a decrease in activity level because of a fear of falling?   No   has been having knee problems will not be addressed    Is the patient reluctant to leave their home because of a fear of falling?   No      Home Social worker  Private residence    Living Arrangements  Alone    Available Help at Discharge  Friend(s)      Prior Function   Level of Independence  Independent    Vocation  Retired    Probation officer work    Leisure  started exercise with arms       Cognition   Overall Cognitive Status  Within Functional Limits for tasks assessed      Observation/Other Assessments   Observations  well healed mastecomty incision , visible cording in axilla     Quick DASH   38.64      Sensation   Additional Comments  Numbness present left medial upper arm neural tension present left upper extremity.      Posture/Postural Control   Posture/Postural Control  Postural limitations    Postural Limitations  Rounded Shoulders;Forward head    Posture Comments  dimpling in posterior shoulder from forward shoulder, full anterior abdomen       ROM / Strength   AROM / PROM / Strength  AROM;Strength      AROM   Right Shoulder Extension  --    Right Shoulder Flexion  160 Degrees  Right Shoulder ABduction  175 Degrees    Right Shoulder Internal Rotation  --    Right Shoulder  External Rotation  808 Degrees    Left Shoulder Extension  --    Left Shoulder Flexion  140 Degrees    Left Shoulder ABduction  125 Degrees   pulling down arm down to wrist    Left Shoulder Internal Rotation  --    Left Shoulder External Rotation  74 Degrees    Cervical Flexion  --    Cervical Extension  --    Cervical - Right Side Bend  --    Cervical - Left Side Bend  --    Cervical - Right Rotation  --    Cervical - Left Rotation  WNL      Strength   Overall Strength  --    Overall Strength Comments  limted to 3-/5 in left arm by tightness in chest       Palpation   Palpation comment  frimness in anterior chest above incision with tightness in axilla with palpable cording . Very tight trigger point in left posterior axilla at teres major/lattissimus muscle. decreased skin excursion at anterior chest at mastectomy incision         LYMPHEDEMA/ONCOLOGY QUESTIONNAIRE - 07/17/18 1533      Type   Cancer Type  Left breast cancer      Surgeries   Mastectomy Date  09/25/17    Axillary Lymph Node Dissection Date  09/25/17    Number Lymph Nodes Removed  13      Treatment   Active Chemotherapy Treatment  No    Past Chemotherapy Treatment  Yes    Date  08/01/17    Active Radiation Treatment  No    Past Radiation Treatment  No    Current Hormone Treatment  No    Past Hormone Therapy  No      What other symptoms do you have   Are you Having Heaviness or Tightness  Yes    Are you having Pain  Yes    Are you having pitting edema  No    Is it Hard or Difficult finding clothes that fit  No    Do you have infections  No    Is there Decreased scar mobility  Yes    [place]mer Sign  No      Lymphedema Assessments   Lymphedema Assessments  Upper extremities      Right Upper Extremity Lymphedema   15 cm Proximal to Olecranon Process  36.5 cm    10 cm Proximal to Olecranon Process  34.5 cm    Olecranon Process  31 cm    15 cm Proximal to Ulnar Styloid Process  31 cm    10 cm  Proximal to Ulnar Styloid Process  27 cm    Just Proximal to Ulnar Styloid Process  18.8 cm    Across Hand at [employer]  19.5 cm    At [place] of 2nd Digit  7 cm      Left Upper Extremity Lymphedema   15 cm Proximal to Olecranon Process  36 cm    10 cm Proximal to Olecranon Process  34 cm    Olecranon Process  31 cm    15 cm Proximal to Ulnar Styloid Process  28.5 cm    10 cm Proximal to Ulnar Styloid Process  25 cm    Just Proximal to Ulnar Styloid Process  17.5 cm  Across Hand at [employer]  20 cm    At [place] of 2nd Digit  6.5 cm          Katina Dung - 07/17/18 0001    Open a tight or new jar  Moderate difficulty    Do heavy household chores (wash walls, wash floors)  Mild difficulty    Carry a shopping bag or briefcase  Mild difficulty    Wash your back  Moderate difficulty    Use a knife to cut food  Mild difficulty    Recreational activities in which you take some force or impact through your arm, shoulder, or hand (golf, hammering, tennis)  Moderate difficulty    During the past week, to what extent has your arm, shoulder or hand problem interfered with your normal social activities with family, friends, neighbors, or groups?  Modererately    During the past week, to what extent has your arm, shoulder or hand problem limited your work or other regular daily activities  Modererately    Arm, shoulder, or hand pain.  Moderate    Tingling (pins and needles) in your arm, shoulder, or hand  Mild    Difficulty Sleeping  Mild difficulty    DASH Score  38.64 %        Objective measurements completed on examination: See above findings.      [facility] Adult PT Treatment/Exercise - 07/17/18 0001      Self-Care   Other Self-Care Comments   gave pt a piece of medium TG soft to wear on left arm to see if it will help with symptoms              PT Education - 07/17/18 1853    Education provided  Yes    Education Details  remove tg soft at any signs of discomfort      Methods  Explanation    Comprehension  Verbalized understanding          PT Long Term Goals - 07/17/18 1907      PT LONG TERM GOAL #1   Title  Pt will have 150 degrees of painfree shoulder abduction so that she can perform her ADLS easier     Baseline  125 degrees with c/o pain from cording     Time  4    Period  Weeks    Status  New      PT LONG TERM GOAL #2   Title  Pt will be independent in home exercise program and progression for shoulder strength     Time  4    Period  Weeks    Status  New      PT LONG TERM GOAL #3   Title  Pt will reports tenderness to palpation in anterior and posterior shoulder is decreased by 50 %     Time  4    Period  Weeks    Status  New             Plan - 07/17/18 1859    Clinical Impression Statement  Pt returns to PT with recurrance of axillary cording extending to left wrist. She also has stiffness in left shoulder with fullness in anterior chest and tender tight trigger points in left anterior and posterior shoulder. She wants to learn to increase exercise safely.     Clinical Impairments Affecting Rehab Potential  extensive radiation ending 12/21/17    PT Frequency  2x / week    PT  Duration  4 weeks    PT Treatment/Interventions  ADLs/Self Care Home Management;Therapeutic exercise;DME Instruction;Patient/family education;Manual techniques;Passive range of motion;Scar mobilization;Manual lymph drainage    PT Next Visit Plan  Soft tissue to work to left upper quadrant including scar mobilizaion, myofascial release and manua lymph drainage as needed. Stretching to left arm cording.  See if tg soft helped, check her sleeve when she brings it in.     Consulted and Agree with Plan of Care  Patient       Patient will benefit from skilled therapeutic intervention in order to improve the following deficits and impairments:  Decreased skin integrity, Decreased scar mobility, Decreased knowledge of precautions, Decreased knowledge of use of  DME, Increased fascial restricitons, Impaired UE functional use, Pain, Decreased range of motion, Postural dysfunction  Visit Diagnosis: Abnormal posture - Plan: PT plan of care cert/re-cert  Aftercare following surgery for neoplasm - Plan: PT plan of care cert/re-cert  Stiffness of left shoulder, not elsewhere classified - Plan: PT plan of care cert/re-cert  Disorder of the skin and subcutaneous tissue related to radiation, unspecified - Plan: PT plan of care cert/re-cert     Problem List Patient Active Problem List   Diagnosis Date Noted  . Genetic testing 12/14/2017  . Family history of uterine cancer   . Family history of prostate cancer   . Melanoma in situ of right upper extremity ([place]) 07/18/2017  . Port-A-Cath in place 04/18/2017  . Drug-induced neutropenia ([place]) 03/09/2017  . Family history of breast cancer 02/02/2017  . Family history of colon cancer 02/02/2017  . Family history of prostate cancer in father 02/02/2017  . Malignant neoplasm of overlapping sites of left breast in female, estrogen receptor positive ([place]) 01/31/2017  . Hx of hot flashes, menopausal, HRT in the past 12/19/2011  . Osteopenia, stable, DEXA 2013, recs to repeat in 2018 12/19/2011  . Hx of Hyperlipidemia, LDL 144 in 07/2011 12/19/2011  . Vitamin D insufficiency 12/19/2011  . Fibroid    Donato Heinz. Owens Shark PT  Norwood Levo 07/17/2018, 7:12 PM  [place], [place], [address] Phone: [phone]   Fax:  [phone]  Name: KAYELYNN ABDOU MRN: [id number] Date of Birth: 05-05-1950

## 2018-07-18 ENCOUNTER — Ambulatory Visit: Payer: Medicare Other

## 2018-07-18 ENCOUNTER — Other Ambulatory Visit: Payer: Self-pay

## 2018-07-18 ENCOUNTER — Encounter: Payer: Self-pay | Admitting: Adult Health

## 2018-07-18 ENCOUNTER — Inpatient Hospital Stay: Payer: Medicare Other | Attending: Oncology | Admitting: Adult Health

## 2018-07-18 DIAGNOSIS — Z79899 Other long term (current) drug therapy: Secondary | ICD-10-CM

## 2018-07-18 DIAGNOSIS — Z79810 Long term (current) use of selective estrogen receptor modulators (SERMs): Secondary | ICD-10-CM

## 2018-07-18 DIAGNOSIS — Z170 Estrogen receptor positive status [ER+]: Secondary | ICD-10-CM

## 2018-07-18 DIAGNOSIS — C50812 Malignant neoplasm of overlapping sites of left female breast: Secondary | ICD-10-CM | POA: Diagnosis not present

## 2018-07-18 DIAGNOSIS — R293 Abnormal posture: Secondary | ICD-10-CM

## 2018-07-18 DIAGNOSIS — M25612 Stiffness of left shoulder, not elsewhere classified: Secondary | ICD-10-CM

## 2018-07-18 DIAGNOSIS — Z803 Family history of malignant neoplasm of breast: Secondary | ICD-10-CM

## 2018-07-18 DIAGNOSIS — Z8042 Family history of malignant neoplasm of prostate: Secondary | ICD-10-CM

## 2018-07-18 DIAGNOSIS — L599 Disorder of the skin and subcutaneous tissue related to radiation, unspecified: Secondary | ICD-10-CM

## 2018-07-18 DIAGNOSIS — C50212 Malignant neoplasm of upper-inner quadrant of left female breast: Secondary | ICD-10-CM

## 2018-07-18 DIAGNOSIS — Z800 Family history of malignant neoplasm of digestive organs: Secondary | ICD-10-CM

## 2018-07-18 DIAGNOSIS — Z483 Aftercare following surgery for neoplasm: Secondary | ICD-10-CM

## 2018-07-18 NOTE — Patient Instructions (Signed)

## 2018-07-18 NOTE — Therapy (Signed)
[place], [place], [address] Phone: [phone]   Fax:  [phone]  Physical Therapy Treatment  Patient Details  Name: SHAQUAVIA WHISONANT MRN: [id number] Date of Birth: 31-Jul-1950 Referring Provider (PT): Dr. Autumn Messing   Encounter Date: 07/18/2018  PT End of Session - 07/18/18 1634    Visit Number  2    Number of Visits  9    Date for PT Re-Evaluation  08/17/18    PT Start Time  1536    PT Stop Time  1622    PT Time Calculation (min)  46 min    Activity Tolerance  Patient tolerated treatment well    Behavior During Therapy  [facility] for tasks assessed/performed       Past Medical History:  Diagnosis Date  . Anemia   . Anxiety   . Arthritis   . Bilateral dry eyes   . Breast cancer, left breast ([place])   . Family history of breast cancer 02/02/2017  . Family history of breast cancer   . Family history of colon cancer 02/02/2017  . Family history of colon cancer   . Family history of prostate cancer   . Family history of prostate cancer in father 02/02/2017  . Family history of uterine cancer   . Fibroid   . History of kidney stones   . Hyperlipidemia   . Kidney stones   . Melanoma of upper arm ([place]) 2009   RIGHT ARM   . Obesity (BMI 30.0-34.9)   . Osteopenia 09/2017   T score -1.9 max 8% / 0.8% statistically significant decline at right and left hip stable at the spine  . PONV (postoperative nausea and vomiting)   . Squamous carcinoma     of the skin  . UTI (urinary tract infection)   . Varicose veins of both lower extremities   . Vitamin D deficiency 07/2009   LOW VITAMIN D 29  . Vitamin deficiency 07/2008   VITAMIN D LOW 23    Past Surgical History:  Procedure Laterality Date  . ABDOMINAL SURGERY  1975   ovairan cyst   . APPENDECTOMY    . BREAST LUMPECTOMY WITH RADIOACTIVE SEED LOCALIZATION Right 09/25/2017   Procedure: RIGHT BREAST LUMPECTOMY WITH RADIOACTIVE SEED LOCALIZATION;  Surgeon:  Jovita Kussmaul, MD;  Location: [facility];  Service: General;  Laterality: Right;  . BREAST SURGERY     bio  . CHOLECYSTECTOMY    . DERMOID CYST  EXCISION    . DILATION AND CURETTAGE OF UTERUS  1998  . GALLBLADDER SURGERY    . HYSTEROSCOPY    . insertion  port a cath  03/02/2016  . MASTECTOMY MODIFIED RADICAL Left 09/25/2017   Procedure: LEFT MODIFIED RADICAL MASTECTOMY;  Surgeon: Jovita Kussmaul, MD;  Location: [facility];  Service: General;  Laterality: Left;  . PORTACATH PLACEMENT N/A 03/02/2017   Procedure: INSERTION PORT-A-CATH;  Surgeon: Jovita Kussmaul, MD;  Location: [facility];  Service: General;  Laterality: N/A;  . SKIN BIOPSY    . SKIN CANCER EXCISION  2010,2011   2010-RIGHT ARM, 2011-LEFT LOWER LEG.-DR Rolm Bookbinder DERM. DR. Sarajane Jews IS HER DERM SURGEON.    There were no vitals filed for this visit.  Subjective Assessment - 07/18/18 1542    Subjective  The sleeve did seem to help my tightness, I was surprised. I just have some tightness in my Lt axilla.    Pertinent History  Neoadjuvant chemotherapy early 2019; left mastectomy  and ALND (9/13 nodes positive) on 09/25/17. Pt also had 33 treatments of radiation and previous PT .  She does not have a compression bra . She has a compression sleeve but she has not worn it.  She is limited in walking by painful knees     Patient Stated Goals  to get rid of the cording in her arm and learn an exercise program so she can go to the gym when they reopen    Currently in Pain?  No/denies                       [facility] Adult PT Treatment/Exercise - 07/18/18 0001      Manual Therapy   Manual Therapy  Manual Lymphatic Drainage (MLD);Passive ROM;Neural Stretch;Myofascial release    Myofascial Release  To Lt chest wall and axilla during P/ROM, also UE pulling to help pt try to relax UE    Manual Lymphatic Drainage (MLD)  In Supine: Short neck, 5 diaphragmatic breaths, Lt inguinal and Rt axillary nodes, Lt axillo-inguinal and anterior inter-axillary  anastomosis, then Lt UE work from lateral upper arm from dorsal hand from proximal to distal then retracing all steps.    Passive ROM  In Supine to Lt shoulder into flexion, abduction and D2. Multiple VCs for pt to relax as she struggled with this due to tightness    Neural Stretch  To Lt UE                  PT Long Term Goals - 07/17/18 1907      PT LONG TERM GOAL #1   Title  Pt will have 150 degrees of painfree shoulder abduction so that she can perform her ADLS easier     Baseline  125 degrees with c/o pain from cording     Time  4    Period  Weeks    Status  New      PT LONG TERM GOAL #2   Title  Pt will be independent in home exercise program and progression for shoulder strength     Time  4    Period  Weeks    Status  New      PT LONG TERM GOAL #3   Title  Pt will reports tenderness to palpation in anterior and posterior shoulder is decreased by 50 %     Time  4    Period  Weeks    Status  New            Plan - 07/18/18 1635    Clinical Impression Statement  Overall pt did tolerate stretching and manual therapy well today but did require multiple VCs to relax throughout session which she reports is difficult due to tightness at chest. She was able to improve with this some though by end of session. Reviewed how to safely progress exercises with "low and slow" principles that she was instructed on during previous episode of therapy. TG soft also helped alleviate some of her symptoms so suggested she start wearing her compression sleeve during this time and especially when she works out. Overall pt reported feeling better at end of session.     Clinical Impairments Affecting Rehab Potential  extensive radiation ending 12/21/17    PT Frequency  2x / week    PT Duration  4 weeks    PT Treatment/Interventions  ADLs/Self Care Home Management;Therapeutic exercise;DME Instruction;Patient/family education;Manual techniques;Passive range of motion;Scar  mobilization;Manual lymph drainage  PT Next Visit Plan  Soft tissue to work to left upper quadrant including scar mobilizaion, myofascial release and manual lymph drainage as needed. Stretching to left arm cording.  Check her sleeve when she brings it in.     Consulted and Agree with Plan of Care  Patient       Patient will benefit from skilled therapeutic intervention in order to improve the following deficits and impairments:  Decreased skin integrity, Decreased scar mobility, Decreased knowledge of precautions, Decreased knowledge of use of DME, Increased fascial restricitons, Impaired UE functional use, Pain, Decreased range of motion, Postural dysfunction  Visit Diagnosis: Abnormal posture  Aftercare following surgery for neoplasm  Stiffness of left shoulder, not elsewhere classified  Disorder of the skin and subcutaneous tissue related to radiation, unspecified  Malignant neoplasm of upper-inner quadrant of left breast in female, estrogen receptor positive ([place])     Problem List Patient Active Problem List   Diagnosis Date Noted  . Genetic testing 12/14/2017  . Family history of uterine cancer   . Family history of prostate cancer   . Melanoma in situ of right upper extremity ([place]) 07/18/2017  . Port-A-Cath in place 04/18/2017  . Drug-induced neutropenia ([place]) 03/09/2017  . Family history of breast cancer 02/02/2017  . Family history of colon cancer 02/02/2017  . Family history of prostate cancer in father 02/02/2017  . Malignant neoplasm of overlapping sites of left breast in female, estrogen receptor positive ([place]) 01/31/2017  . Hx of hot flashes, menopausal, HRT in the past 12/19/2011  . Osteopenia, stable, DEXA 2013, recs to repeat in 2018 12/19/2011  . Hx of Hyperlipidemia, LDL 144 in 07/2011 12/19/2011  . Vitamin D insufficiency 12/19/2011  . Fibroid     Otelia Limes, PTA 07/18/2018, 4:39 PM  [place], [place], [address] Phone: [phone]   Fax:  [phone]  Name: WILLO YOON MRN: [id number] Date of Birth: 1950-06-24

## 2018-07-18 NOTE — Progress Notes (Signed)
SURVIVORSHIP VIRTUAL VISIT:  I connected with Brooke [name] on 07/18/18 at  8:30 AM EDT by telephone and verified that I am speaking with the correct person using two identifiers.   I discussed the limitations, risks, security and privacy concerns of performing an evaluation and management service by telephone and the availability of in person appointments. I also discussed with the patient that there may be a patient responsible charge related to this service. The patient expressed understanding and agreed to proceed.   BRIEF ONCOLOGIC HISTORY:  Oncology History   Negative genetic testing     Malignant neoplasm of overlapping sites of left breast in female, estrogen receptor positive ([place])   01/26/2017 Initial Diagnosis    in the left breast, a cT2 pN1 invasive ductal carcinoma (with some lobular features but E-cadherin positive), grade 2, estrogen and progesterone receptor positive, HER-2 not amplified, with an MIB-1 of 15-20%             (a) breast MRI 02/04/2017 suggests a T3 N1 tumor    03/03/2017 - 08/01/2017 Neo-Adjuvant Chemotherapy    started neoadjuvant cyclophosphamide/docetaxel 03/03/2017, discontinued after 1 cycle with very poor tolerance             (a) started cyclophosphamide/methotrexate/fluorouracil (CMF) 03/28/2017, repeated x7 cycles, last dose 08/01/2017    09/25/2017 Surgery    in the right breast, a complex sclerosing lesion, status post lumpectomy, with no malignancy identified    09/25/2017 Surgery    status post left lumpectomy and axillary lymph node dissection 09/25/17 for a ypT3 ypN2, stage IIIA invasive ductal carcinoma, grade 2, again estrogen and progesterone receptor positive, HER-2 not amplified    10/11/2017 Cancer Staging    Staging form: Breast, AJCC 8th Edition - Pathologic: No Stage Recommended (ypT3, pN2a, cM0, G2, ER+, PR+, HER2-) - Signed by Gardenia Phlegm, NP on 10/11/2017    11/07/2017 - 12/21/2017 Radiation Therapy    adjuvant radiation  completed 12/21/2017             (a) capecitabine sensitization tolerated only the initial week of radiation (b) Site/dose:The patient initially received a dose of 50.4 Gy in 28 fractions to the leftchest wall andleftsupraclavicular region. This was delivered using a 3-D conformal, 4 field technique. The patient then received a boost to the mastectomy scar. This delivered an additional 10 Gy in 5 fractions using an en face electron field. The total dose was 60.4 Gy.     12/12/2017 Genetic Testing    Negative genetic testing on the Multi-cancer panel.  The Multi-Gene Panel offered by [facility] includes sequencing and/or deletion duplication testing of the following 84 genes: AIP, ALK, APC, ATM, AXIN2,BAP1,  BARD1, BLM, BMPR1A, BRCA1, BRCA2, BRIP1, CASR, CDC73, CDH1, CDK4, CDKN1B, CDKN1C, CDKN2A (p14ARF), CDKN2A (p16INK4a), CEBPA, CHEK2, CTNNA1, DICER1, DIS3L2, EGFR (c.2369C>T, p.Thr790Met variant only), EPCAM (Deletion/duplication testing only), FH, FLCN, GATA2, GPC3, GREM1 (Promoter region deletion/duplication testing only), HOXB13 (c.251G>A, p.Gly84Glu), HRAS, KIT, MAX, MEN1, MET, MITF (c.952G>A, p.Glu318Lys variant only), MLH1, MSH2, MSH3, MSH6, MUTYH, NBN, NF1, NF2, NTHL1, PALB2, PDGFRA, PHOX2B, PMS2, POLD1, POLE, POT1, PRKAR1A, PTCH1, PTEN, RAD50, RAD51C, RAD51D, RB1, RECQL4, RET, RUNX1, SDHAF2, SDHA (sequence changes only), SDHB, SDHC, SDHD, SMAD4, SMARCA4, SMARCB1, SMARCE1, STK11, SUFU, TERC, TERT, TMEM127, TP53, TSC1, TSC2, VHL, WRN and WT1.  The report date is 12/12/2017.    02/2018 -  Anti-estrogen oral therapy    Tamoxifen daily     Miscellaneous    (8) Foundation 1 testing shows stable microsatellite status and 1 mutation/  Mb, TP53 mutations x2             (a) PD-L1 dated July 2019 negative      INTERVAL HISTORY:  Brooke [name] to review her survivorship care plan detailing her treatment course for breast cancer, as well as monitoring long-term side effects of that treatment, education  regarding health maintenance, screening, and overall wellness and health promotion.     Overall, Brooke [name] reports feeling quite well.  She is taking Tamoxifen daily and tolerating it moderately well.  She has mild arthralgias that she is tolerating.  She has no vaginal discharge.  She notes she may occasionally miss a dose of medication.  She denies hot flashes.  She sees Dr. Phineas Real regularly.  She has concerns about tamoxifen since her sister had stage IV endometrial cancer.  He is following her closely.    Brooke [name] has been going to PT for some left arm pain.  They are working with her on ROM.  She does not have lymphedema.  So far, Brooke [name] has noted an improvement with her visits with them.  Brooke [name] is feeling well otherwise.  She is exercising and eating healthy.    REVIEW OF SYSTEMS:  Review of Systems  Constitutional: Negative for appetite change, chills, fatigue, fever and unexpected weight change.  HENT:   Negative for hearing loss and lump/mass.   Eyes: Negative for eye problems and icterus.  Respiratory: Negative for chest tightness, cough and shortness of breath.   Cardiovascular: Negative for chest pain, leg swelling and palpitations.  Gastrointestinal: Negative for abdominal distention, abdominal pain, constipation, diarrhea, nausea and vomiting.  Endocrine: Negative for hot flashes.  Genitourinary: Negative for difficulty urinating.   Musculoskeletal: Positive for arthralgias.  Skin: Negative for itching and rash.  Neurological: Negative for dizziness, extremity weakness, headaches and numbness.  Hematological: Negative for adenopathy. Does not bruise/bleed easily.  Psychiatric/Behavioral: Negative for depression. The patient is not nervous/anxious.    Breast: Denies any new nodularity, masses, tenderness, nipple changes, or nipple discharge.      ONCOLOGY TREATMENT TEAM:  1. Surgeon:  Dr. Marlou Starks at [facility] 2. Medical Oncologist: Dr. [name]  3. Radiation  Oncologist: Dr. Lisbeth Renshaw    PAST MEDICAL/SURGICAL HISTORY:  Past Medical History:  Diagnosis Date  . Anemia   . Anxiety   . Arthritis   . Bilateral dry eyes   . Breast cancer, left breast ([place])   . Family history of breast cancer 02/02/2017  . Family history of breast cancer   . Family history of colon cancer 02/02/2017  . Family history of colon cancer   . Family history of prostate cancer   . Family history of prostate cancer in father 02/02/2017  . Family history of uterine cancer   . Fibroid   . History of kidney stones   . Hyperlipidemia   . Kidney stones   . Melanoma of upper arm ([place]) 2009   RIGHT ARM   . Obesity (BMI 30.0-34.9)   . Osteopenia 09/2017   T score -1.9 max 8% / 0.8% statistically significant decline at right and left hip stable at the spine  . PONV (postoperative nausea and vomiting)   . Squamous carcinoma     of the skin  . UTI (urinary tract infection)   . Varicose veins of both lower extremities   . Vitamin D deficiency 07/2009   LOW VITAMIN D 29  . Vitamin deficiency 07/2008   VITAMIN D LOW 23   Past Surgical History:  Procedure Laterality Date  . ABDOMINAL SURGERY  1975   ovairan cyst   . APPENDECTOMY    . BREAST LUMPECTOMY WITH RADIOACTIVE SEED LOCALIZATION Right 09/25/2017   Procedure: RIGHT BREAST LUMPECTOMY WITH RADIOACTIVE SEED LOCALIZATION;  Surgeon: Jovita Kussmaul, MD;  Location: [facility];  Service: General;  Laterality: Right;  . BREAST SURGERY     bio  . CHOLECYSTECTOMY    . DERMOID CYST  EXCISION    . DILATION AND CURETTAGE OF UTERUS  1998  . GALLBLADDER SURGERY    . HYSTEROSCOPY    . insertion  port a cath  03/02/2016  . MASTECTOMY MODIFIED RADICAL Left 09/25/2017   Procedure: LEFT MODIFIED RADICAL MASTECTOMY;  Surgeon: Jovita Kussmaul, MD;  Location: [facility];  Service: General;  Laterality: Left;  . PORTACATH PLACEMENT N/A 03/02/2017   Procedure: INSERTION PORT-A-CATH;  Surgeon: Jovita Kussmaul, MD;  Location: [facility];  Service: General;   Laterality: N/A;  . SKIN BIOPSY    . SKIN CANCER EXCISION  2010,2011   2010-RIGHT ARM, 2011-LEFT LOWER LEG.-DR Rolm Bookbinder DERM. DR. Sarajane Jews IS HER DERM SURGEON.     ALLERGIES:  Allergies  Allergen Reactions  . Neosporin [Neomycin-Bacitracin Zn-Polymyx] Swelling, Rash and Other (See Comments)    Blisters  . Benzalkonium Chloride Other (See Comments)    Redness (Pt is unaware)  It works by killing microorganisms and inhibiting their future growth, and for this reason frequently appears as an ingredient in antibacterial hand wipes, antiseptic creams and anti-itch ointments.  . Latex Rash     CURRENT MEDICATIONS:  Outpatient Encounter Medications as of 07/18/2018  Medication Sig  . acetaminophen (TYLENOL) 500 MG tablet Take 500 mg by mouth every 6 (six) hours as needed for moderate pain or headache.  . alclomethasone (ACLOVATE) 0.05 % cream Apply to affected area twice daily as needed for dermatitis  . b complex vitamins tablet Take 1 tablet by mouth daily.  . betamethasone dipropionate (DIPROLENE) 0.05 % cream APPLY THIN LAYER BID FOR 3 TO 4 WEEKS  . ibuprofen (ADVIL,MOTRIN) 200 MG tablet Take 200 mg by mouth every 6 (six) hours as needed for headache or moderate pain.  Marland Kitchen Lifitegrast (XIIDRA) 5 % SOLN Place 1 drop into both eyes daily as needed (dry eyes).  . Propylene Glycol (SYSTANE COMPLETE) 0.6 % SOLN Place 1 drop into both eyes daily.  . tamoxifen (NOLVADEX) 20 MG tablet Take 20 mg by mouth daily.  . vitamin C (ASCORBIC ACID) 500 MG tablet Take 1,000 mg by mouth daily.   Marland Kitchen VITAMIN D, CHOLECALCIFEROL, PO Take 6 drops by mouth every other day. 1 drop = 1000 international units  . [DISCONTINUED] prochlorperazine (COMPAZINE) 10 MG tablet Take 1 tablet (10 mg total) by mouth every 6 (six) hours as needed (Nausea or vomiting).   No facility-administered encounter medications on file as of 07/18/2018.      ONCOLOGIC FAMILY HISTORY:  Family History  Problem Relation Age of Onset  .  Diabetes Father   . Hypertension Father   . Heart disease Father   . Prostate cancer Father 44  . Breast cancer Sister 46       again at age 38, GT results unk  . Skin cancer Sister   . Colon cancer Paternal Grandfather 110  . Hypertension Mother   . Heart failure Mother   . Uterine cancer Sister 90       stage IV  . Breast cancer Cousin  mat first cousin dx under 43     GENETIC COUNSELING/TESTING: See above   SOCIAL HISTORY:  Social History   Socioeconomic History  . Marital status: Single    Spouse name: Not on file  . Number of children: Not on file  . Years of education: Not on file  . Highest education level: Not on file  Occupational History  . Not on file  Social Needs  . Financial resource strain: Not on file  . Food insecurity:    Worry: Not on file    Inability: Not on file  . Transportation needs:    Medical: Not on file    Non-medical: Not on file  Tobacco Use  . Smoking status: Never Smoker  . Smokeless tobacco: Never Used  Substance and Sexual Activity  . Alcohol use: No    Frequency: Never    Comment: rare  . Drug use: No  . Sexual activity: Never    Birth control/protection: Post-menopausal  Lifestyle  . Physical activity:    Days per week: Not on file    Minutes per session: Not on file  . Stress: Not on file  Relationships  . Social connections:    Talks on phone: Not on file    Gets together: Not on file    Attends religious service: Not on file    Active member of club or organization: Not on file    Attends meetings of clubs or organizations: Not on file    Relationship status: Not on file  . Intimate partner violence:    Fear of current or ex partner: No    Emotionally abused: No    Physically abused: No    Forced sexual activity: No  Other Topics Concern  . Not on file  Social History Narrative  . Not on file     OBSERVATIONS/OBJECTIVE:  Speech is normal and non pressured.  She is in no apparent distress. Sounds  well, breathing is non labored, mood and behavior are normal.    LABORATORY DATA:  None for this visit.  DIAGNOSTIC IMAGING:       ASSESSMENT AND PLAN:  Ms.. Garzon is a pleasant 68 y.o. female with Stage IIA left breast invasive ductal carcinoma, ER+/PR+/HER2-, diagnosed in 12/2016, treated with neoadjuvant chemotherapy,  mastectomy, adjuvant radiation therapy with Capecitabine sensitization (tolerated only first week of radiation), and anti-estrogen therapy with Tamoxifen beginning in 02/2018.  She presents to the Survivorship Clinic for our initial meeting and routine follow-up post-completion of treatment for breast cancer.    1. Stage IIA left breast cancer:  Ms. Anchondo is continuing to recover from definitive treatment for breast cancer. She will follow-up with her medical oncologist, Dr. [name] in 08/2018 with history and physical exam per surveillance protocol.  She will continue her anti-estrogen therapy with Tamoxifen. Thus far, she is tolerating the Tamoxifen well, with minimal side effects.  Her mammogram is due 04/2019; orders placed today.  Her breast density is category B. Today, a comprehensive survivorship care plan and treatment summary was reviewed with the patient today detailing her breast cancer diagnosis, treatment course, potential late/long-term effects of treatment, appropriate follow-up care with recommendations for the future, and patient education resources.  A copy of this summary, along with a letter will be sent to the patient's primary care provider via mail/fax/In Basket message after today's visit.    2.  Stage III breast cancer: We reviewed her staging pre and post treatment in detail.  I reviewed that  due to her cancer size at time of surgery, we could consider doing scans to evaluate for metastatic disease.  Lyberti would like to talk to Dr. [name] about this further, as she is hesitant to be exposed to radiation at this point.    3. Bone health:  Given Ms.  Goodnow's age/history of breast cancer, she is at risk for bone demineralization.  Her last DEXA scan was in 09/2017, which showed osteopenia with a T score of -1.9 in her left hip.  I counseled her that Tamoxifen has a protective effect on the bones.  In the meantime, she was encouraged to increase her consumption of foods rich in calcium, as well as increase her weight-bearing activities.  She was given education on specific activities to promote bone health.  4. Cancer screening:  Due to Ms. Canale's history and her age, she should receive screening for skin cancers, colon cancer, and gynecologic cancers.  The information and recommendations are listed on the patient's comprehensive care plan/treatment summary and were reviewed in detail with the patient.    5. Health maintenance and wellness promotion: Ms. Fenley was encouraged to consume 5-7 servings of fruits and vegetables per day. We reviewed the "Nutrition Rainbow" handout, as well as the handout "Take Control of Your Health and Reduce Your Cancer Risk" from the [employer].  She was also encouraged to engage in moderate to vigorous exercise for 30 minutes per day most days of the week. We discussed the [employer] fitness program, which is designed for cancer survivors to help them become more physically fit after cancer treatments.  She was instructed to limit her alcohol consumption and continue to abstain from tobacco use.     6. Support services/counseling: It is not uncommon for this period of the patient's cancer care trajectory to be one of many emotions and stressors.  We discussed how this can be increasingly difficult during the times of quarantine and social distancing due to the COVID-19 pandemic.   She was given information regarding our available services and encouraged to contact me with any questions or for help enrolling in any of our support group/programs.    Follow up instructions:    -Return to cancer center  in 08/2018 for f/u with Dr. [name]  -Mammogram due in 04/2019 -She is welcome to return back to the Survivorship Clinic at any time; no additional follow-up needed at this time.  -Consider referral back to survivorship as a long-term survivor for continued surveillance  The patient was provided an opportunity to ask questions and all were answered. The patient agreed with the plan and demonstrated an understanding of the instructions.   The patient was advised to call back or seek an in-person evaluation if the symptoms worsen or if the condition fails to improve as anticipated.   I provided 35 minutes of non face-to-face telephone visit time during this encounter, and > 50% was spent counseling as documented under my assessment & plan.  Scot Dock, NP

## 2018-07-19 ENCOUNTER — Encounter: Payer: Medicare Other | Admitting: Adult Health

## 2018-07-25 ENCOUNTER — Telehealth: Payer: Self-pay | Admitting: *Deleted

## 2018-07-25 ENCOUNTER — Other Ambulatory Visit: Payer: Self-pay

## 2018-07-25 ENCOUNTER — Ambulatory Visit (INDEPENDENT_AMBULATORY_CARE_PROVIDER_SITE_OTHER): Payer: Medicare Other | Admitting: Family Medicine

## 2018-07-25 ENCOUNTER — Encounter: Payer: Self-pay | Admitting: Family Medicine

## 2018-07-25 DIAGNOSIS — Z1211 Encounter for screening for malignant neoplasm of colon: Secondary | ICD-10-CM

## 2018-07-25 DIAGNOSIS — L309 Dermatitis, unspecified: Secondary | ICD-10-CM

## 2018-07-25 DIAGNOSIS — E785 Hyperlipidemia, unspecified: Secondary | ICD-10-CM | POA: Diagnosis not present

## 2018-07-25 DIAGNOSIS — E559 Vitamin D deficiency, unspecified: Secondary | ICD-10-CM

## 2018-07-25 DIAGNOSIS — G8929 Other chronic pain: Secondary | ICD-10-CM

## 2018-07-25 DIAGNOSIS — M25561 Pain in right knee: Secondary | ICD-10-CM

## 2018-07-25 DIAGNOSIS — R7309 Other abnormal glucose: Secondary | ICD-10-CM

## 2018-07-25 DIAGNOSIS — M25562 Pain in left knee: Secondary | ICD-10-CM

## 2018-07-25 NOTE — Progress Notes (Signed)
Virtual Visit via Telephone Note  I connected with Brooke [name]  on 07/25/18 at  9:30 AM EDT by telephone and verified that I am speaking with the correct person using two identifiers.   I discussed the limitations, risks, security and privacy concerns of performing an evaluation and management service by telephone and the availability of in person appointments. I also discussed with the patient that there may be a patient responsible charge related to this service. The patient expressed understanding and agreed to proceed.  Location patient: home Location provider: work office Participants present for the call: patient, provider Patient did not have a visit in the prior 7 days to address this/these issue(s).  Last visit with [facility] was 10/2017 for AWV History of Present Illness: Has been seeing derm since 1997 and gets screening twice yearly. Saw Dr. Ubaldo Glassing 07/11/18 and she states she has large red spot on lower left leg. Spots have been there awhile, but past visit Dr. Ubaldo Glassing asked her if she had diabetes. Suggested she get tested for diabetes. Beyond redness there is firmness to skin which dermatologist stated sometimes is related to diabetes. Would like to rule out diabetes.   Has arthritis both knees - has heard about voltaren gel and wondering about using this. Pain is getting in way of walking. If she can warm up this eases it up some. Thinks that if she could get into regular exercise routine.   Arthritis: feels that arthritis has progressed this year. Not sure if related to all that her body has been through this year. Usually able to work through it but sometimes is severe enough that it almost stops her. Never pain free. Does think that she gets some swelling in the joints. Right thumb bothers her sometimes. Left arm is compromised a little since breast surgeries/treatments. Sometimes ankles bother her. Saw ortho for right knee years ago. Was told at that time that she had lost a lot of  "cushion".   Osteopenia: Last bone density scan was 2019; taking vitamin D. Working on [employer] bearing exercise.   Melanoma right upper extrem: follows with Dr. Ubaldo Glassing.   Kidney stones: remote history; single attack. Saw urologist for this.   Hx of gall stones/cholecystectomy.   Anxiety: tends to be more anxious type of person. Last year was extraordinary and cut her off guard.   Breast ca L: follows with Dr. [name], Dr. Phineas Real. On tamoxifen (started Jan 2020 and will be on at least 5 years).    Drug induced neutropenia; resolved.  ??colonoscopy - she was discussing with oncology at last visit but didn't make final decision about cologuard versus colonoscopy.    Observations/Objective: Patient sounds cheerful and well on the phone. I do not appreciate any SOB. Speech and thought processing are grossly intact. Patient reported vitals:  Assessment and Plan:  1. Elevated glucose - Hemoglobin A1c; Future  2. Dermatitis Following with dermatology. Will check A1C to follow up on elevated glucose.   3. Vitamin D insufficiency Taking supplement.  4. Hyperlipidemia, unspecified hyperlipidemia type - Lipid panel; Future - TSH; Future  5. Chronic pain of both knees - ANA; Future - Sedimentation rate; Future - Rheumatoid factor; Future - CBC with Differential/Platelet; Future  Follow Up Instructions:  Return AWV in september; bloodwork when able. cologuard ordered.  [id number]  I did not refer this patient for an OV in the next 24 hours for this/these issue(s).  I discussed the assessment and treatment plan with the patient. The patient  was provided an opportunity to ask questions and all were answered. The patient agreed with the plan and demonstrated an understanding of the instructions.   The patient was advised to call back or seek an in-person evaluation if the symptoms worsen or if the condition fails to improve as anticipated.  I provided 37 minutes of  non-face-to-face time during this encounter.   Micheline Rough, MD

## 2018-07-25 NOTE — Telephone Encounter (Signed)
I called the pt and left a detailed message to call back to schedule appts as below.

## 2018-07-25 NOTE — Telephone Encounter (Signed)
-----   Message from [name], MD sent at 07/25/2018 10:12 AM EDT ----- Please call to schedule bloodwork when she is able. AWV September. cologuard ordered.

## 2018-07-26 ENCOUNTER — Ambulatory Visit: Payer: Medicare Other | Admitting: Physical Therapy

## 2018-07-26 ENCOUNTER — Encounter: Payer: Self-pay | Admitting: Physical Therapy

## 2018-07-26 ENCOUNTER — Other Ambulatory Visit: Payer: Self-pay

## 2018-07-26 DIAGNOSIS — M25612 Stiffness of left shoulder, not elsewhere classified: Secondary | ICD-10-CM

## 2018-07-26 DIAGNOSIS — R293 Abnormal posture: Secondary | ICD-10-CM | POA: Diagnosis not present

## 2018-07-26 DIAGNOSIS — Z483 Aftercare following surgery for neoplasm: Secondary | ICD-10-CM

## 2018-07-26 DIAGNOSIS — L599 Disorder of the skin and subcutaneous tissue related to radiation, unspecified: Secondary | ICD-10-CM

## 2018-07-26 NOTE — Therapy (Signed)
[place], [place], [address] Phone: [phone]   Fax:  [phone]  Physical Therapy Treatment  Patient Details  Name: Brooke [name] MRN: [id number] Date of Birth: 1950/08/10 Referring Provider (PT): Dr. Autumn Messing   Encounter Date: 07/26/2018  PT End of Session - 07/26/18 1711    Visit Number  3    Number of Visits  9    Date for PT Re-Evaluation  08/17/18    PT Start Time  3220    PT Stop Time  1655    PT Time Calculation (min)  45 min    Activity Tolerance  Patient tolerated treatment well       Past Medical History:  Diagnosis Date  . Anemia   . Anxiety   . Arthritis   . Bilateral dry eyes   . Breast cancer, left breast ([place])   . Family history of breast cancer 02/02/2017  . Family history of breast cancer   . Family history of colon cancer 02/02/2017  . Family history of colon cancer   . Family history of prostate cancer   . Family history of prostate cancer in father 02/02/2017  . Family history of uterine cancer   . Fibroid   . History of kidney stones   . Hyperlipidemia   . Kidney stones   . Melanoma of upper arm ([place]) 2009   RIGHT ARM   . Obesity (BMI 30.0-34.9)   . Osteopenia 09/2017   T score -1.9 max 8% / 0.8% statistically significant decline at right and left hip stable at the spine  . PONV (postoperative nausea and vomiting)   . Squamous carcinoma     of the skin  . UTI (urinary tract infection)   . Varicose veins of both lower extremities   . Vitamin D deficiency 07/2009   LOW VITAMIN D 29  . Vitamin deficiency 07/2008   VITAMIN D LOW 23    Past Surgical History:  Procedure Laterality Date  . ABDOMINAL SURGERY  1975   ovairan cyst   . APPENDECTOMY    . BREAST LUMPECTOMY WITH RADIOACTIVE SEED LOCALIZATION Right 09/25/2017   Procedure: RIGHT BREAST LUMPECTOMY WITH RADIOACTIVE SEED LOCALIZATION;  Surgeon: Jovita Kussmaul, MD;  Location: [facility];  Service: General;   Laterality: Right;  . BREAST SURGERY     bio  . CHOLECYSTECTOMY    . DERMOID CYST  EXCISION    . DILATION AND CURETTAGE OF UTERUS  1998  . GALLBLADDER SURGERY    . HYSTEROSCOPY    . insertion  port a cath  03/02/2016  . MASTECTOMY MODIFIED RADICAL Left 09/25/2017   Procedure: LEFT MODIFIED RADICAL MASTECTOMY;  Surgeon: Jovita Kussmaul, MD;  Location: [facility];  Service: General;  Laterality: Left;  . PORTACATH PLACEMENT N/A 03/02/2017   Procedure: INSERTION PORT-A-CATH;  Surgeon: Jovita Kussmaul, MD;  Location: [facility];  Service: General;  Laterality: N/A;  . SKIN BIOPSY    . SKIN CANCER EXCISION  2010,2011   2010-RIGHT ARM, 2011-LEFT LOWER LEG.-DR Rolm Bookbinder DERM. DR. Sarajane Jews IS HER DERM SURGEON.    There were no vitals filed for this visit.  Subjective Assessment - 07/26/18 1614    Subjective  Pt says that she is feeling better in her forearm, she found that the tg soft helped .  She is trying to use her arm everyday, but finds that she still has the tightness in her posterior axilla and upper back.. She  would like her shoulder to get "unlocked"     Pertinent History  Neoadjuvant chemotherapy early 2019; left mastectomy and ALND (9/13 nodes positive) on 09/25/17. Pt also had 33 treatments of radiation and previous PT .  She does not have a compression bra . She has a compression sleeve but she has not worn it.  She is limited in walking by painful knees     Patient Stated Goals  to get rid of the cording in her arm and learn an exercise program so she can go to the gym when they reopen    Currently in Pain?  No/denies   It has tightness like its "locked" like it is bound down                       [facility] Adult PT Treatment/Exercise - 07/26/18 0001      Exercises   Exercises  Shoulder      Shoulder Exercises: Supine   Protraction  AROM;Left;5 reps      Shoulder Exercises: Sidelying   Other Sidelying Exercises  reaching to ceiling with scapular girdle     Other Sidelying  Exercises  small circles with hand pointed to ceiling.       Manual Therapy   Manual Therapy  Soft tissue mobilization;Myofascial release    Manual therapy comments  skin mobility in chest and and scar     Soft tissue mobilization  with pt in right sidelying and thick massage cream, soft tissue work to tight area at left latissimus and pec major muscle as well as posterior and upper shoulder and neck     Myofascial Release  prolonged pressure to trigger points along posteior axilla, pec major ,     Passive ROM   to limits of available range in flexion and diagonal patterns    pt feels stretching down into chest                  PT Long Term Goals - 07/17/18 1907      PT LONG TERM GOAL #1   Title  Pt will have 150 degrees of painfree shoulder abduction so that she can perform her ADLS easier     Baseline  125 degrees with c/o pain from cording     Time  4    Period  Weeks    Status  New      PT LONG TERM GOAL #2   Title  Pt will be independent in home exercise program and progression for shoulder strength     Time  4    Period  Weeks    Status  New      PT LONG TERM GOAL #3   Title  Pt will reports tenderness to palpation in anterior and posterior shoulder is decreased by 50 %     Time  4    Period  Weeks    Status  New            Plan - 07/26/18 1711    Clinical Impression Statement  Pt reports her shoulder felt much better after treatment.  She did not show signs of cording today ,but still felt some fullness in left medial upper arm, just above the elbow. Pt will wear her TG soft for comfort and will try to wear her compression sleeve to see if that will help too.     Clinical Impairments Affecting Rehab Potential  extensive radiation ending 12/21/17  PT Frequency  2x / week    PT Duration  4 weeks    PT Treatment/Interventions  ADLs/Self Care Home Management;Therapeutic exercise;DME Instruction;Patient/family education;Manual techniques;Passive range of  motion;Scar mobilization;Manual lymph drainage    PT Next Visit Plan  Soft tissue to work to left upper quadrant focusing on tender areas at latts and pec major . Include scar mobilizaion, myofascial release and manual lymph drainage as needed. Stretching to left arm cording.  Check her sleeve when she brings it in.     Consulted and Agree with Plan of Care  Patient       Patient will benefit from skilled therapeutic intervention in order to improve the following deficits and impairments:  Decreased skin integrity, Decreased scar mobility, Decreased knowledge of precautions, Decreased knowledge of use of DME, Increased fascial restricitons, Impaired UE functional use, Pain, Decreased range of motion, Postural dysfunction  Visit Diagnosis: Abnormal posture  Aftercare following surgery for neoplasm  Stiffness of left shoulder, not elsewhere classified  Disorder of the skin and subcutaneous tissue related to radiation, unspecified     Problem List Patient Active Problem List   Diagnosis Date Noted  . Genetic testing 12/14/2017  . Family history of uterine cancer   . Family history of prostate cancer   . Melanoma in situ of right upper extremity ([place]) 07/18/2017  . Port-A-Cath in place 04/18/2017  . Drug-induced neutropenia ([place]) 03/09/2017  . Family history of breast cancer 02/02/2017  . Family history of colon cancer 02/02/2017  . Family history of prostate cancer in father 02/02/2017  . Malignant neoplasm of overlapping sites of left breast in female, estrogen receptor positive ([place]) 01/31/2017  . Hx of hot flashes, menopausal, HRT in the past 12/19/2011  . Osteopenia, stable, DEXA 2013, recs to repeat in 2018 12/19/2011  . Hx of Hyperlipidemia, LDL 144 in 07/2011 12/19/2011  . Vitamin D insufficiency 12/19/2011  . Fibroid    Donato Heinz. Owens Shark PT  [place]wood Levo 07/26/2018, 5:16 PM  [place], [place], [address] Phone: [phone]   Fax:  [phone]  Name: ARIS MOMAN MRN: [id number] Date of Birth: 09/11/1950

## 2018-07-31 ENCOUNTER — Other Ambulatory Visit: Payer: Self-pay

## 2018-07-31 ENCOUNTER — Ambulatory Visit: Payer: Medicare Other | Attending: General Surgery

## 2018-07-31 DIAGNOSIS — C50212 Malignant neoplasm of upper-inner quadrant of left female breast: Secondary | ICD-10-CM

## 2018-07-31 DIAGNOSIS — Z483 Aftercare following surgery for neoplasm: Secondary | ICD-10-CM | POA: Diagnosis present

## 2018-07-31 DIAGNOSIS — M25612 Stiffness of left shoulder, not elsewhere classified: Secondary | ICD-10-CM | POA: Insufficient documentation

## 2018-07-31 DIAGNOSIS — L599 Disorder of the skin and subcutaneous tissue related to radiation, unspecified: Secondary | ICD-10-CM | POA: Diagnosis present

## 2018-07-31 DIAGNOSIS — R293 Abnormal posture: Secondary | ICD-10-CM

## 2018-07-31 DIAGNOSIS — Z170 Estrogen receptor positive status [ER+]: Secondary | ICD-10-CM | POA: Diagnosis present

## 2018-07-31 NOTE — Therapy (Signed)
[place], [place], [address] Phone: [phone]   Fax:  [phone]  Physical Therapy Treatment  Patient Details  Name: Brooke [name] MRN: [id number] Date of Birth: 1950-08-02 Referring Provider (PT): Dr. Autumn Messing   Encounter Date: 07/31/2018  PT End of Session - 07/31/18 1046    Visit Number  4    Number of Visits  9    Date for PT Re-Evaluation  08/17/18    PT Start Time  1005    PT Stop Time  1046    PT Time Calculation (min)  41 min    Activity Tolerance  Patient tolerated treatment well    Behavior During Therapy  [facility] for tasks assessed/performed       Past Medical History:  Diagnosis Date  . Anemia   . Anxiety   . Arthritis   . Bilateral dry eyes   . Breast cancer, left breast ([place])   . Family history of breast cancer 02/02/2017  . Family history of breast cancer   . Family history of colon cancer 02/02/2017  . Family history of colon cancer   . Family history of prostate cancer   . Family history of prostate cancer in father 02/02/2017  . Family history of uterine cancer   . Fibroid   . History of kidney stones   . Hyperlipidemia   . Kidney stones   . Melanoma of upper arm ([place]) 2009   RIGHT ARM   . Obesity (BMI 30.0-34.9)   . Osteopenia 09/2017   T score -1.9 max 8% / 0.8% statistically significant decline at right and left hip stable at the spine  . PONV (postoperative nausea and vomiting)   . Squamous carcinoma     of the skin  . UTI (urinary tract infection)   . Varicose veins of both lower extremities   . Vitamin D deficiency 07/2009   LOW VITAMIN D 29  . Vitamin deficiency 07/2008   VITAMIN D LOW 23    Past Surgical History:  Procedure Laterality Date  . ABDOMINAL SURGERY  1975   ovairan cyst   . APPENDECTOMY    . BREAST LUMPECTOMY WITH RADIOACTIVE SEED LOCALIZATION Right 09/25/2017   Procedure: RIGHT BREAST LUMPECTOMY WITH RADIOACTIVE SEED LOCALIZATION;  Surgeon:  Jovita Kussmaul, MD;  Location: [facility];  Service: General;  Laterality: Right;  . BREAST SURGERY     bio  . CHOLECYSTECTOMY    . DERMOID CYST  EXCISION    . DILATION AND CURETTAGE OF UTERUS  1998  . GALLBLADDER SURGERY    . HYSTEROSCOPY    . insertion  port a cath  03/02/2016  . MASTECTOMY MODIFIED RADICAL Left 09/25/2017   Procedure: LEFT MODIFIED RADICAL MASTECTOMY;  Surgeon: Jovita Kussmaul, MD;  Location: [facility];  Service: General;  Laterality: Left;  . PORTACATH PLACEMENT N/A 03/02/2017   Procedure: INSERTION PORT-A-CATH;  Surgeon: Jovita Kussmaul, MD;  Location: [facility];  Service: General;  Laterality: N/A;  . SKIN BIOPSY    . SKIN CANCER EXCISION  2010,2011   2010-RIGHT ARM, 2011-LEFT LOWER LEG.-DR Rolm Bookbinder DERM. DR. Sarajane Jews IS HER DERM SURGEON.    There were no vitals filed for this visit.  Subjective Assessment - 07/31/18 1011    Subjective  I felt great after last session. Helene Kelp really got alot of the tightness out of my shoulder and upper back and it really helped. My whole Lt upper quadarnt just feels completely  different.    Pertinent History  Neoadjuvant chemotherapy early 2019; left mastectomy and ALND (9/13 nodes positive) on 09/25/17. Pt also had 33 treatments of radiation and previous PT .  She does not have a compression bra . She has a compression sleeve but she has not worn it.  She is limited in walking by painful knees     Patient Stated Goals  to get rid of the cording in her arm and learn an exercise program so she can go to the gym when they reopen    Currently in Pain?  No/denies                       [facility] Adult PT Treatment/Exercise - 07/31/18 0001      Manual Therapy   Manual therapy comments  skin mobility in chest and and scar     Soft tissue mobilization  with pt in right sidelying and thick massage cream, soft tissue work to tight area at left latissimus and pec major muscle as well as posterior and upper shoulder and neck     Myofascial  Release  prolonged pressure to trigger points along posteior axilla, pec major ,     Passive ROM  In Supine into flexion and D2, also abduction and this is where pt most limited and c/o tightness across chest                  PT Long Term Goals - 07/17/18 1907      PT LONG TERM GOAL #1   Title  Pt will have 150 degrees of painfree shoulder abduction so that she can perform her ADLS easier     Baseline  125 degrees with c/o pain from cording     Time  4    Period  Weeks    Status  New      PT LONG TERM GOAL #2   Title  Pt will be independent in home exercise program and progression for shoulder strength     Time  4    Period  Weeks    Status  New      PT LONG TERM GOAL #3   Title  Pt will reports tenderness to palpation in anterior and posterior shoulder is decreased by 50 %     Time  4    Period  Weeks    Status  New            Plan - 07/31/18 1047    Clinical Impression Statement  Pt reports her Lt upper quadrant feeling much improved since last session. Continued with manual therapy done at [facility] and pt reports much less tightness/trigger points felt this visit and overall Lt shoulder/upper quadrant continued to feel less tight.     Clinical Impairments Affecting Rehab Potential  extensive radiation ending 12/21/17    PT Frequency  2x / week    PT Duration  4 weeks    PT Treatment/Interventions  ADLs/Self Care Home Management;Therapeutic exercise;DME Instruction;Patient/family education;Manual techniques;Passive range of motion;Scar mobilization;Manual lymph drainage    PT Next Visit Plan  Soft tissue to work to left upper quadrant focusing on tender areas at latts and pec major . Include scar mobilizaion, myofascial release and manual lymph drainage as needed. Stretching to left arm cording.  Check her sleeve when she brings it in.     Consulted and Agree with Plan of Care  Patient       Patient  will benefit from skilled therapeutic intervention in  order to improve the following deficits and impairments:  Decreased skin integrity, Decreased scar mobility, Decreased knowledge of precautions, Decreased knowledge of use of DME, Increased fascial restricitons, Impaired UE functional use, Pain, Decreased range of motion, Postural dysfunction  Visit Diagnosis: Abnormal posture  Aftercare following surgery for neoplasm  Stiffness of left shoulder, not elsewhere classified  Disorder of the skin and subcutaneous tissue related to radiation, unspecified  Malignant neoplasm of upper-inner quadrant of left breast in female, estrogen receptor positive ([place])     Problem List Patient Active Problem List   Diagnosis Date Noted  . Genetic testing 12/14/2017  . Family history of uterine cancer   . Family history of prostate cancer   . Melanoma in situ of right upper extremity ([place]) 07/18/2017  . Port-A-Cath in place 04/18/2017  . Drug-induced neutropenia ([place]) 03/09/2017  . Family history of breast cancer 02/02/2017  . Family history of colon cancer 02/02/2017  . Family history of prostate cancer in father 02/02/2017  . Malignant neoplasm of overlapping sites of left breast in female, estrogen receptor positive ([place]) 01/31/2017  . Hx of hot flashes, menopausal, HRT in the past 12/19/2011  . Osteopenia, stable, DEXA 2013, recs to repeat in 2018 12/19/2011  . Hx of Hyperlipidemia, LDL 144 in 07/2011 12/19/2011  . Vitamin D insufficiency 12/19/2011  . Fibroid     Otelia Limes, PTA 07/31/2018, 10:54 AM  [place], [place], [address] Phone: [phone]   Fax:  [phone]  Name: Brooke [name] MRN: [id number] Date of Birth: 1950/05/10

## 2018-08-02 ENCOUNTER — Other Ambulatory Visit: Payer: Self-pay

## 2018-08-02 ENCOUNTER — Ambulatory Visit: Payer: Medicare Other

## 2018-08-02 DIAGNOSIS — Z170 Estrogen receptor positive status [ER+]: Secondary | ICD-10-CM

## 2018-08-02 DIAGNOSIS — L599 Disorder of the skin and subcutaneous tissue related to radiation, unspecified: Secondary | ICD-10-CM

## 2018-08-02 DIAGNOSIS — C50212 Malignant neoplasm of upper-inner quadrant of left female breast: Secondary | ICD-10-CM

## 2018-08-02 DIAGNOSIS — R293 Abnormal posture: Secondary | ICD-10-CM

## 2018-08-02 DIAGNOSIS — M25612 Stiffness of left shoulder, not elsewhere classified: Secondary | ICD-10-CM

## 2018-08-02 DIAGNOSIS — Z483 Aftercare following surgery for neoplasm: Secondary | ICD-10-CM

## 2018-08-02 NOTE — Therapy (Signed)
[place], [place], [address] Phone: [phone]   Fax:  [phone]  Physical Therapy Treatment  Patient Details  Name: Brooke [name] MRN: [id number] Date of Birth: [birth year] Referring Provider (PT): Dr. Autumn Messing   Encounter Date: 08/02/2018  PT End of Session - 08/02/18 1104    Visit Number  5    Number of Visits  9    Date for PT Re-Evaluation  08/17/18    PT Start Time  1006    PT Stop Time  1058    PT Time Calculation (min)  52 min    Activity Tolerance  Patient tolerated treatment well    Behavior During Therapy  [facility] for tasks assessed/performed       Past Medical History:  Diagnosis Date  . Anemia   . Anxiety   . Arthritis   . Bilateral dry eyes   . Breast cancer, left breast ([place])   . Family history of breast cancer 02/02/2017  . Family history of breast cancer   . Family history of colon cancer 02/02/2017  . Family history of colon cancer   . Family history of prostate cancer   . Family history of prostate cancer in father 02/02/2017  . Family history of uterine cancer   . Fibroid   . History of kidney stones   . Hyperlipidemia   . Kidney stones   . Melanoma of upper arm ([place]) 2009   RIGHT ARM   . Obesity (BMI 30.0-34.9)   . Osteopenia 09/2017   T score -1.9 max 8% / 0.8% statistically significant decline at right and left hip stable at the spine  . PONV (postoperative nausea and vomiting)   . Squamous carcinoma     of the skin  . UTI (urinary tract infection)   . Varicose veins of both lower extremities   . Vitamin D deficiency 07/2009   LOW VITAMIN D 29  . Vitamin deficiency 07/2008   VITAMIN D LOW 23    Past Surgical History:  Procedure Laterality Date  . ABDOMINAL SURGERY  1975   ovairan cyst   . APPENDECTOMY    . BREAST LUMPECTOMY WITH RADIOACTIVE SEED LOCALIZATION Right 09/25/2017   Procedure: RIGHT BREAST LUMPECTOMY WITH RADIOACTIVE SEED LOCALIZATION;  Surgeon:  Jovita Kussmaul, MD;  Location: [facility];  Service: General;  Laterality: Right;  . BREAST SURGERY     bio  . CHOLECYSTECTOMY    . DERMOID CYST  EXCISION    . DILATION AND CURETTAGE OF UTERUS  1998  . GALLBLADDER SURGERY    . HYSTEROSCOPY    . insertion  port a cath  03/02/2016  . MASTECTOMY MODIFIED RADICAL Left 09/25/2017   Procedure: LEFT MODIFIED RADICAL MASTECTOMY;  Surgeon: Jovita Kussmaul, MD;  Location: [facility];  Service: General;  Laterality: Left;  . PORTACATH PLACEMENT N/A 03/02/2017   Procedure: INSERTION PORT-A-CATH;  Surgeon: Jovita Kussmaul, MD;  Location: [facility];  Service: General;  Laterality: N/A;  . SKIN BIOPSY    . SKIN CANCER EXCISION  2010,2011   2010-RIGHT ARM, 2011-LEFT LOWER LEG.-DR Rolm Bookbinder DERM. DR. Sarajane Jews IS HER DERM SURGEON.    There were no vitals filed for this visit.  Subjective Assessment - 08/02/18 1012    Subjective  The work from the last 2 sessions has really helped loosen up my whole Lt upper quadrant. It feels pretty good today.     Pertinent History  Neoadjuvant chemotherapy early  2019; left mastectomy and ALND (9/13 nodes positive) on 09/25/17. Pt also had 33 treatments of radiation and previous PT .  She does not have a compression bra . She has a compression sleeve but she has not worn it.  She is limited in walking by painful knees     Patient Stated Goals  to get rid of the cording in her arm and learn an exercise program so she can go to the gym when they reopen    Currently in Pain?  No/denies                       [facility] Adult PT Treatment/Exercise - 08/02/18 0001      Shoulder Exercises: Pulleys   Flexion  2 minutes    Flexion Limitations  VCs to decrease Lt scapular compensation    ABduction  2 minutes    ABduction Limitations  Demo and VCs to decrease Lt scapular compensation at end of motion      Shoulder Exercises: Therapy Ball   Flexion  Both;10 reps   forward lean into end of stretch   Flexion Limitations  VCs to  remind pt to decrease scapular compensations with Lt     ABduction  Left;5 reps   same side lean into end of stretch     Shoulder Exercises: Stretch   Corner Stretch  3 reps;20 seconds   in doorway, pt reports very good stretch     Manual Therapy   Manual therapy comments  skin mobility in chest and and scar     Soft tissue mobilization  with pt in right sidelying and thick massage cream, soft tissue work to tight area at left latissimus and pec major muscle as well as posterior and upper shoulder and neck     Myofascial Release  prolonged pressure to trigger points along posteior axilla, pec major ,     Passive ROM  In Supine into flexion and D2, also abduction and this is where pt most limited and c/o tightness across chest                  PT Long Term Goals - 07/17/18 1907      PT LONG TERM GOAL #1   Title  Pt will have 150 degrees of painfree shoulder abduction so that she can perform her ADLS easier     Baseline  125 degrees with c/o pain from cording     Time  4    Period  Weeks    Status  New      PT LONG TERM GOAL #2   Title  Pt will be independent in home exercise program and progression for shoulder strength     Time  4    Period  Weeks    Status  New      PT LONG TERM GOAL #3   Title  Pt will reports tenderness to palpation in anterior and posterior shoulder is decreased by 50 %     Time  4    Period  Weeks    Status  New            Plan - 08/02/18 1104    Clinical Impression Statement  Progressed treatment today to include AA/ROM stretches which pt tolerated very well, though did need min VCs to decrease Lt scapular compensations. Also continued with manual therapy/P/ROM of Lt upper quadrant/shoulder as pt is still very tight and limited due to fascial restrictions  at chest wall and posterior axilla.     Clinical Impairments Affecting Rehab Potential  extensive radiation ending 12/21/17    PT Frequency  2x / week    PT Duration  4 weeks    PT  Treatment/Interventions  ADLs/Self Care Home Management;Therapeutic exercise;DME Instruction;Patient/family education;Manual techniques;Passive range of motion;Scar mobilization;Manual lymph drainage    PT Next Visit Plan  Soft tissue to work to left upper quadrant focusing on tender areas at latts and pec major . Include scar mobilizaion, myofascial release and manual lymph drainage as needed. Stretching to left arm cording.  Check her sleeve when she brings it in.     Consulted and Agree with Plan of Care  Patient       Patient will benefit from skilled therapeutic intervention in order to improve the following deficits and impairments:  Decreased skin integrity, Decreased scar mobility, Decreased knowledge of precautions, Decreased knowledge of use of DME, Increased fascial restricitons, Impaired UE functional use, Pain, Decreased range of motion, Postural dysfunction  Visit Diagnosis: Abnormal posture  Aftercare following surgery for neoplasm  Stiffness of left shoulder, not elsewhere classified  Disorder of the skin and subcutaneous tissue related to radiation, unspecified  Malignant neoplasm of upper-inner quadrant of left breast in female, estrogen receptor positive ([place])     Problem List Patient Active Problem List   Diagnosis Date Noted  . Genetic testing 12/14/2017  . Family history of uterine cancer   . Family history of prostate cancer   . Melanoma in situ of right upper extremity ([place]) 07/18/2017  . Port-A-Cath in place 04/18/2017  . Drug-induced neutropenia ([place]) 03/09/2017  . Family history of breast cancer 02/02/2017  . Family history of colon cancer 02/02/2017  . Family history of prostate cancer in father 02/02/2017  . Malignant neoplasm of overlapping sites of left breast in female, estrogen receptor positive ([place]) 01/31/2017  . Hx of hot flashes, menopausal, HRT in the past 12/19/2011  . Osteopenia, stable, DEXA 2013, recs to repeat in 2018 12/19/2011  . Hx of  Hyperlipidemia, LDL 144 in 07/2011 12/19/2011  . Vitamin D insufficiency 12/19/2011  . Fibroid     Otelia Limes, PTA 08/02/2018, 11:07 AM  [place], [place], [address] Phone: [phone]   Fax:  [phone]  Name: Brooke [name] MRN: [id number] Date of Birth: 07-11-1950

## 2018-08-06 ENCOUNTER — Encounter: Payer: Medicare Other | Admitting: Physical Therapy

## 2018-08-08 ENCOUNTER — Other Ambulatory Visit: Payer: Self-pay

## 2018-08-08 ENCOUNTER — Ambulatory Visit: Payer: Medicare Other | Admitting: Physical Therapy

## 2018-08-08 ENCOUNTER — Encounter: Payer: Self-pay | Admitting: Physical Therapy

## 2018-08-08 DIAGNOSIS — M25612 Stiffness of left shoulder, not elsewhere classified: Secondary | ICD-10-CM

## 2018-08-08 DIAGNOSIS — R293 Abnormal posture: Secondary | ICD-10-CM | POA: Diagnosis not present

## 2018-08-08 DIAGNOSIS — Z483 Aftercare following surgery for neoplasm: Secondary | ICD-10-CM

## 2018-08-08 DIAGNOSIS — L599 Disorder of the skin and subcutaneous tissue related to radiation, unspecified: Secondary | ICD-10-CM

## 2018-08-08 NOTE — Therapy (Signed)
[place], [place], [address] Phone: [phone]   Fax:  [phone]  Physical Therapy Treatment  Patient Details  Name: Brooke [name] MRN: [id number] Date of Birth: 01/07/1951 Referring Provider (PT): Dr. Autumn Messing   Encounter Date: 08/08/2018  PT End of Session - 08/08/18 1532    Visit Number  6    Number of Visits  9    Date for PT Re-Evaluation  08/17/18    PT Start Time  1432    PT Stop Time  1517    PT Time Calculation (min)  45 min    Activity Tolerance  Patient tolerated treatment well    Behavior During Therapy  [facility] for tasks assessed/performed       Past Medical History:  Diagnosis Date  . Anemia   . Anxiety   . Arthritis   . Bilateral dry eyes   . Breast cancer, left breast ([place])   . Family history of breast cancer 02/02/2017  . Family history of breast cancer   . Family history of colon cancer 02/02/2017  . Family history of colon cancer   . Family history of prostate cancer   . Family history of prostate cancer in father 02/02/2017  . Family history of uterine cancer   . Fibroid   . History of kidney stones   . Hyperlipidemia   . Kidney stones   . Melanoma of upper arm ([place]) 2009   RIGHT ARM   . Obesity (BMI 30.0-34.9)   . Osteopenia 09/2017   T score -1.9 max 8% / 0.8% statistically significant decline at right and left hip stable at the spine  . PONV (postoperative nausea and vomiting)   . Squamous carcinoma     of the skin  . UTI (urinary tract infection)   . Varicose veins of both lower extremities   . Vitamin D deficiency 07/2009   LOW VITAMIN D 29  . Vitamin deficiency 07/2008   VITAMIN D LOW 23    Past Surgical History:  Procedure Laterality Date  . ABDOMINAL SURGERY  1975   ovairan cyst   . APPENDECTOMY    . BREAST LUMPECTOMY WITH RADIOACTIVE SEED LOCALIZATION Right 09/25/2017   Procedure: RIGHT BREAST LUMPECTOMY WITH RADIOACTIVE SEED LOCALIZATION;  Surgeon:  Jovita Kussmaul, MD;  Location: [place];  Service: General;  Laterality: Right;  . BREAST SURGERY     bio  . CHOLECYSTECTOMY    . DERMOID CYST  EXCISION    . DILATION AND CURETTAGE OF UTERUS  1998  . GALLBLADDER SURGERY    . HYSTEROSCOPY    . insertion  port a cath  03/02/2016  . MASTECTOMY MODIFIED RADICAL Left 09/25/2017   Procedure: LEFT MODIFIED RADICAL MASTECTOMY;  Surgeon: Jovita Kussmaul, MD;  Location: [place];  Service: General;  Laterality: Left;  . PORTACATH PLACEMENT N/A 03/02/2017   Procedure: INSERTION PORT-A-CATH;  Surgeon: Jovita Kussmaul, MD;  Location: [place];  Service: General;  Laterality: N/A;  . SKIN BIOPSY    . SKIN CANCER EXCISION  2010,2011   2010-RIGHT ARM, 2011-LEFT LOWER LEG.-DR Rolm Bookbinder DERM. DR. Sarajane Jews IS HER DERM SURGEON.    There were no vitals filed for this visit.  Subjective Assessment - 08/08/18 1529    Subjective  Pt states she is feeling much better, but she is having some swelling in medial upper left arm  She still feels tightness in her left back and axilla  Pertinent History  Neoadjuvant chemotherapy early 2019; left mastectomy and ALND (9/13 nodes positive) on 09/25/17. Pt also had 33 treatments of radiation and previous PT .  She does not have a compression bra . She has a compression sleeve but she has not worn it.  She is limited in walking by painful knees     Patient Stated Goals  to get rid of the cording in her arm and learn an exercise program so she can go to the gym when they reopen    Currently in Pain?  No/denies                       [facility] Adult PT Treatment/Exercise - 08/08/18 0001      Shoulder Exercises: Sidelying   ABduction  AROM;Left;5 reps   with stretch to shoulder and scapular area    Other Sidelying Exercises  small circles with hand pointed to ceiling.       Manual Therapy   Manual therapy comments  skin mobility in chest and and scar    gave pt 2 tennis balls for self myofascial release    Soft tissue  mobilization  with pt in right sidelying and thick massage cream, soft tissue work to tight area at left latissimus and pec major muscle as well as posterior and upper shoulder and neck     Myofascial Release  prolonged pressure to trigger points along posteior axilla, pec major ,     Passive ROM  In Supine into flexion and D2, also abduction and this is where pt most limited and c/o tightness across chest                  PT Long Term Goals - 07/17/18 1907      PT LONG TERM GOAL #1   Title  Pt will have 150 degrees of painfree shoulder abduction so that she can perform her ADLS easier     Baseline  125 degrees with c/o pain from cording     Time  4    Period  Weeks    Status  New      PT LONG TERM GOAL #2   Title  Pt will be independent in home exercise program and progression for shoulder strength     Time  4    Period  Weeks    Status  New      PT LONG TERM GOAL #3   Title  Pt will reports tenderness to palpation in anterior and posterior shoulder is decreased by 50 %     Time  4    Period  Weeks    Status  New            Plan - 08/08/18 1533    Clinical Impression Statement  Pt continues to improve with manual tecniqes but still has palpable tightness in posterior shoulder and lateral chest on the left     Clinical Impairments Affecting Rehab Potential  extensive radiation ending 12/21/17    PT Frequency  2x / week    PT Duration  4 weeks    PT Next Visit Plan  Soft tissue to work to left upper quadrant focusing on tender areas at latts and pec major . Include scar mobilizaion, myofascial release and manual lymph drainage as needed. Stretching to left arm cording.  Check her sleeve when she brings it in.     Consulted and Agree with Plan of Care  Patient  Patient will benefit from skilled therapeutic intervention in order to improve the following deficits and impairments:  Decreased skin integrity, Decreased scar mobility, Decreased knowledge of  precautions, Decreased knowledge of use of DME, Increased fascial restricitons, Impaired UE functional use, Pain, Decreased range of motion, Postural dysfunction  Visit Diagnosis: Abnormal posture  Aftercare following surgery for neoplasm  Stiffness of left shoulder, not elsewhere classified  Disorder of the skin and subcutaneous tissue related to radiation, unspecified     Problem List Patient Active Problem List   Diagnosis Date Noted  . Genetic testing 12/14/2017  . Family history of uterine cancer   . Family history of prostate cancer   . Melanoma in situ of right upper extremity ([place]) 07/18/2017  . Port-A-Cath in place 04/18/2017  . Drug-induced neutropenia ([place]) 03/09/2017  . Family history of breast cancer 02/02/2017  . Family history of colon cancer 02/02/2017  . Family history of prostate cancer in father 02/02/2017  . Malignant neoplasm of overlapping sites of left breast in female, estrogen receptor positive ([place]) 01/31/2017  . Hx of hot flashes, menopausal, HRT in the past 12/19/2011  . Osteopenia, stable, DEXA 2013, recs to repeat in 2018 12/19/2011  . Hx of Hyperlipidemia, LDL 144 in 07/2011 12/19/2011  . Vitamin D insufficiency 12/19/2011  . Fibroid    Norwood Levo 08/08/2018, 3:34 PM  [place], [place], [address] Phone: [phone]   Fax:  [phone]  Name: Brooke [name] MRN: [id number] Date of Birth: 1951/02/13

## 2018-08-09 ENCOUNTER — Telehealth: Payer: Self-pay | Admitting: Family Medicine

## 2018-08-09 DIAGNOSIS — R300 Dysuria: Secondary | ICD-10-CM

## 2018-08-09 NOTE — Telephone Encounter (Signed)
Patient is coming in for labs on Monday.  She thinks she is showing symptoms of a possible uti.  She wants to leave a urine specimen to be tested for a uti when she gets her labs done.  Patient will need a call back at [phone].

## 2018-08-10 ENCOUNTER — Other Ambulatory Visit (INDEPENDENT_AMBULATORY_CARE_PROVIDER_SITE_OTHER): Payer: Medicare Other

## 2018-08-10 ENCOUNTER — Other Ambulatory Visit: Payer: Self-pay

## 2018-08-10 DIAGNOSIS — R300 Dysuria: Secondary | ICD-10-CM | POA: Diagnosis not present

## 2018-08-10 LAB — POC URINALSYSI DIPSTICK (AUTOMATED)
Bilirubin, UA: NEGATIVE
Blood, UA: NEGATIVE
Glucose, UA: NEGATIVE
Ketones, UA: NEGATIVE
Leukocytes, UA: NEGATIVE
Nitrite, UA: NEGATIVE
Protein, UA: NEGATIVE
Spec Grav, UA: 1.025 (ref 1.010–1.025)
Urobilinogen, UA: 0.2 E.U./dL
pH, UA: 5 (ref 5.0–8.0)

## 2018-08-10 NOTE — Telephone Encounter (Signed)
I left a message for the pt to return my call. 

## 2018-08-10 NOTE — Telephone Encounter (Signed)
Orders entered and I called the pt and informed her to come to the office now.

## 2018-08-10 NOTE — Telephone Encounter (Signed)
Patient returned call. Reports she just got the message. Patient would like to come in to leave urine. I did advise since its a couple hours close to closing and the weekend if sx persist or get worse to go to Urgent care.

## 2018-08-10 NOTE — Telephone Encounter (Signed)
Labs are already ordered; ok to add UA AND CULTURE to this. We have openings today we could discuss urinary symptoms so that this gets addressed before we head into weekend? Or perhaps lab appt can be moved to today so she can get do urine as well.

## 2018-08-10 NOTE — Addendum Note (Signed)
Addended by: Agnes Lawrence on: 08/10/2018 02:19 PM   Modules accepted: Orders

## 2018-08-13 ENCOUNTER — Encounter: Payer: Self-pay | Admitting: Physical Therapy

## 2018-08-13 ENCOUNTER — Ambulatory Visit: Payer: Medicare Other | Admitting: Physical Therapy

## 2018-08-13 ENCOUNTER — Other Ambulatory Visit (INDEPENDENT_AMBULATORY_CARE_PROVIDER_SITE_OTHER): Payer: Medicare Other

## 2018-08-13 ENCOUNTER — Other Ambulatory Visit: Payer: Self-pay

## 2018-08-13 DIAGNOSIS — G8929 Other chronic pain: Secondary | ICD-10-CM

## 2018-08-13 DIAGNOSIS — R7309 Other abnormal glucose: Secondary | ICD-10-CM

## 2018-08-13 DIAGNOSIS — E785 Hyperlipidemia, unspecified: Secondary | ICD-10-CM

## 2018-08-13 DIAGNOSIS — M25561 Pain in right knee: Secondary | ICD-10-CM

## 2018-08-13 DIAGNOSIS — M25562 Pain in left knee: Secondary | ICD-10-CM | POA: Diagnosis not present

## 2018-08-13 DIAGNOSIS — R293 Abnormal posture: Secondary | ICD-10-CM | POA: Diagnosis not present

## 2018-08-13 DIAGNOSIS — M25612 Stiffness of left shoulder, not elsewhere classified: Secondary | ICD-10-CM

## 2018-08-13 DIAGNOSIS — Z483 Aftercare following surgery for neoplasm: Secondary | ICD-10-CM

## 2018-08-13 DIAGNOSIS — L599 Disorder of the skin and subcutaneous tissue related to radiation, unspecified: Secondary | ICD-10-CM

## 2018-08-13 LAB — LIPID PANEL
Cholesterol: 192 mg/dL (ref 0–200)
HDL: 51 mg/dL (ref >39.00)
NonHDL: 140.7
Total CHOL/HDL Ratio: 4
Triglycerides: 255 mg/dL — ABNORMAL HIGH (ref 0.0–149.0)
VLDL: 51 mg/dL — ABNORMAL HIGH (ref 0.0–40.0)

## 2018-08-13 LAB — CBC WITH DIFFERENTIAL/PLATELET
Basophils Absolute: 0.1 10*3/uL (ref 0.0–0.1)
Basophils Relative: 1.1 % (ref 0.0–3.0)
Eosinophils Absolute: 0.1 10*3/uL (ref 0.0–0.7)
Eosinophils Relative: 1.8 % (ref 0.0–5.0)
HCT: 39.7 % (ref 36.0–46.0)
Hemoglobin: 13.4 g/dL (ref 12.0–15.0)
Lymphocytes Relative: 14.4 % (ref 12.0–46.0)
Lymphs Abs: 0.7 10*3/uL (ref 0.7–4.0)
MCHC: 33.9 g/dL (ref 30.0–36.0)
MCV: 89 fl (ref 78.0–100.0)
Monocytes Absolute: 0.5 10*3/uL (ref 0.1–1.0)
Monocytes Relative: 9.1 % (ref 3.0–12.0)
Neutro Abs: 3.7 10*3/uL (ref 1.4–7.7)
Neutrophils Relative %: 73.6 % (ref 43.0–77.0)
Platelets: 271 10*3/uL (ref 150.0–400.0)
RBC: 4.45 Mil/uL (ref 3.87–5.11)
RDW: 13.2 % (ref 11.5–15.5)
WBC: 5 10*3/uL (ref 4.0–10.5)

## 2018-08-13 LAB — TSH: TSH: 3.99 u[IU]/mL (ref 0.35–4.50)

## 2018-08-13 LAB — LDL CHOLESTEROL, DIRECT: Direct LDL: 115 mg/dL

## 2018-08-13 LAB — SEDIMENTATION RATE: Sed Rate: 12 mm/hr (ref 0–30)

## 2018-08-13 LAB — HEMOGLOBIN A1C: Hgb A1c MFr Bld: 5.7 % (ref 4.6–6.5)

## 2018-08-13 NOTE — Therapy (Signed)
[place], [place], [address] Phone: [phone]   Fax:  [phone]  Physical Therapy Treatment  Patient Details  Name: Brooke [name] MRN: [id number] Date of Birth: 1950/10/09 Referring Provider (PT): Dr. Autumn Messing   Encounter Date: 08/13/2018  PT End of Session - 08/13/18 1638    Visit Number  7    Number of Visits  9    Date for PT Re-Evaluation  08/17/18    PT Start Time  3536    PT Stop Time  1520    PT Time Calculation (min)  45 min       Past Medical History:  Diagnosis Date  . Anemia   . Anxiety   . Arthritis   . Bilateral dry eyes   . Breast cancer, left breast ([place])   . Family history of breast cancer 02/02/2017  . Family history of breast cancer   . Family history of colon cancer 02/02/2017  . Family history of colon cancer   . Family history of prostate cancer   . Family history of prostate cancer in father 02/02/2017  . Family history of uterine cancer   . Fibroid   . History of kidney stones   . Hyperlipidemia   . Kidney stones   . Melanoma of upper arm ([place]) 2009   RIGHT ARM   . Obesity (BMI 30.0-34.9)   . Osteopenia 09/2017   T score -1.9 max 8% / 0.8% statistically significant decline at right and left hip stable at the spine  . PONV (postoperative nausea and vomiting)   . Squamous carcinoma     of the skin  . UTI (urinary tract infection)   . Varicose veins of both lower extremities   . Vitamin D deficiency 07/2009   LOW VITAMIN D 29  . Vitamin deficiency 07/2008   VITAMIN D LOW 23    Past Surgical History:  Procedure Laterality Date  . ABDOMINAL SURGERY  1975   ovairan cyst   . APPENDECTOMY    . BREAST LUMPECTOMY WITH RADIOACTIVE SEED LOCALIZATION Right 09/25/2017   Procedure: RIGHT BREAST LUMPECTOMY WITH RADIOACTIVE SEED LOCALIZATION;  Surgeon: Jovita Kussmaul, MD;  Location: [place];  Service: General;  Laterality: Right;  . BREAST SURGERY     bio  .  CHOLECYSTECTOMY    . DERMOID CYST  EXCISION    . DILATION AND CURETTAGE OF UTERUS  1998  . GALLBLADDER SURGERY    . HYSTEROSCOPY    . insertion  port a cath  03/02/2016  . MASTECTOMY MODIFIED RADICAL Left 09/25/2017   Procedure: LEFT MODIFIED RADICAL MASTECTOMY;  Surgeon: Jovita Kussmaul, MD;  Location: [place];  Service: General;  Laterality: Left;  . PORTACATH PLACEMENT N/A 03/02/2017   Procedure: INSERTION PORT-A-CATH;  Surgeon: Jovita Kussmaul, MD;  Location: [place];  Service: General;  Laterality: N/A;  . SKIN BIOPSY    . SKIN CANCER EXCISION  2010,2011   2010-RIGHT ARM, 2011-LEFT LOWER LEG.-DR Rolm Bookbinder DERM. DR. Sarajane Jews IS HER DERM SURGEON.    There were no vitals filed for this visit.  Subjective Assessment - 08/13/18 1436    Subjective  Pt states she is still having a little swelling around around the elbow area.  She is not having the tightness down the arm. She is not wearing the other compression sleeve beause it is too hot.  She is wearing the tg soft in the evening and at night  Pertinent History  Neoadjuvant chemotherapy early 2019; left mastectomy and ALND (9/13 nodes positive) on 09/25/17. Pt also had 33 treatments of radiation and previous PT .  She does not have a compression bra . She has a compression sleeve but she has not worn it.  She is limited in walking by painful knees     Patient Stated Goals  to get rid of the cording in her arm and learn an exercise program so she can go to the gym when they reopen    Currently in Pain?  No/denies            LYMPHEDEMA/ONCOLOGY QUESTIONNAIRE - 08/13/18 1444      Left Upper Extremity Lymphedema   15 cm Proximal to Olecranon Process  37 cm    10 cm Proximal to Olecranon Process  35 cm    Olecranon Process  31 cm    15 cm Proximal to Ulnar Styloid Process  29 cm    10 cm Proximal to Ulnar Styloid Process  25 cm    Just Proximal to Ulnar Styloid Process  17 cm    Across Hand at [employer]  20 cm    At Paint of 2nd  Digit  6.5 cm                [facility] Adult PT Treatment/Exercise - 08/13/18 0001      Manual Therapy   Manual Therapy  Edema management    Manual therapy comments  skin mobility in chest and and scar     Edema Management  remeasured arm and applied tg soft at end of session     Soft tissue mobilization  with pt in right sidelying and thick massage cream, soft tissue work to tight area at left latissimus and pec major muscle as well as posterior and upper shoulder and neck     Myofascial Release  prolonged pressure to trigger points along posteior axilla, pec major ,     Manual Lymphatic Drainage (MLD)  In Supine: Short neck, 5 diaphragmatic breaths, Lt inguinal and Rt axillary nodes, Lt axillo-inguinal and anterior inter-axillary anastomosis, then Lt UE work from lateral upper arm from dorsal hand from proximal to distal then retracing all steps.                  PT Long Term Goals - 07/17/18 1907      PT LONG TERM GOAL #1   Title  Pt will have 150 degrees of painfree shoulder abduction so that she can perform her ADLS easier     Baseline  125 degrees with c/o pain from cording     Time  4    Period  Weeks    Status  New      PT LONG TERM GOAL #2   Title  Pt will be independent in home exercise program and progression for shoulder strength     Time  4    Period  Weeks    Status  New      PT LONG TERM GOAL #3   Title  Pt will reports tenderness to palpation in anterior and posterior shoulder is decreased by 50 %     Time  4    Period  Weeks    Status  New            Plan - 08/13/18 1639    Clinical Impression Statement  Pt is still having swelling in left arm. She may benefit  from a Tribute wrap garment??  cointinues to improve with manual techniques to left scapular area    PT Frequency  2x / week    PT Duration  4 weeks    PT Treatment/Interventions  ADLs/Self Care Home Management;Therapeutic exercise;DME Instruction;Patient/family education;Manual  techniques;Passive range of motion;Scar mobilization;Manual lymph drainage    PT Next Visit Plan  Soft tissue to work to left upper quadrant focusing on tender areas at latts and pec major . Include scar mobilizaion, myofascial release and manual lymph drainage as needed. Stretching to left arm cording.  Check her sleeve when she brings it in.  consider Tribute wrap garment       Patient will benefit from skilled therapeutic intervention in order to improve the following deficits and impairments:     Visit Diagnosis: 1. Abnormal posture   2. Aftercare following surgery for neoplasm   3. Stiffness of left shoulder, not elsewhere classified   4. Disorder of the skin and subcutaneous tissue related to radiation, unspecified        Problem List Patient Active Problem List   Diagnosis Date Noted  . Genetic testing 12/14/2017  . Family history of uterine cancer   . Family history of prostate cancer   . Melanoma in situ of right upper extremity ([place]) 07/18/2017  . Port-A-Cath in place 04/18/2017  . Drug-induced neutropenia ([place]) 03/09/2017  . Family history of breast cancer 02/02/2017  . Family history of colon cancer 02/02/2017  . Family history of prostate cancer in father 02/02/2017  . Malignant neoplasm of overlapping sites of left breast in female, estrogen receptor positive ([place]) 01/31/2017  . Hx of hot flashes, menopausal, HRT in the past 12/19/2011  . Osteopenia, stable, DEXA 2013, recs to repeat in 2018 12/19/2011  . Hx of Hyperlipidemia, LDL 144 in 07/2011 12/19/2011  . Vitamin D insufficiency 12/19/2011  . Fibroid    Donato Heinz. Owens Shark PT  Norwood Levo 08/13/2018, 4:41 PM  [place], [place], [address] Phone: [phone]   Fax:  [phone]  Name: Brooke [name] MRN: [id number] Date of Birth: October 16, 1950

## 2018-08-15 ENCOUNTER — Other Ambulatory Visit: Payer: Self-pay

## 2018-08-15 ENCOUNTER — Ambulatory Visit: Payer: Medicare Other | Admitting: Physical Therapy

## 2018-08-15 DIAGNOSIS — Z483 Aftercare following surgery for neoplasm: Secondary | ICD-10-CM

## 2018-08-15 DIAGNOSIS — L599 Disorder of the skin and subcutaneous tissue related to radiation, unspecified: Secondary | ICD-10-CM

## 2018-08-15 DIAGNOSIS — R293 Abnormal posture: Secondary | ICD-10-CM | POA: Diagnosis not present

## 2018-08-15 DIAGNOSIS — M25612 Stiffness of left shoulder, not elsewhere classified: Secondary | ICD-10-CM

## 2018-08-15 LAB — RHEUMATOID FACTOR: Rheumatoid fact SerPl-aCnc: <14 IU/mL (ref <14)

## 2018-08-15 LAB — ANA: Anti Nuclear Antibody (ANA): POSITIVE — AB

## 2018-08-15 LAB — ANTI-NUCLEAR AB-TITER (ANA TITER): ANA Titer 1: 1:80 {titer} — ABNORMAL HIGH

## 2018-08-15 NOTE — Patient Instructions (Signed)
SHOULDER: Flexion - Supine (Cane)        Cancer Rehab [id number] ° ° ° °Hold cane in both hands. Raise arms up overhead. Do not allow back to arch. Hold _5__ seconds. Do __5-10__ times; __1-2__ times a day. ° ° °SELF ASSISTED WITH OBJECT: Shoulder Abduction / Adduction - Supine ° ° ° °Hold cane with both hands. Move both arms from side to side, keep elbows straight.  Hold when stretch felt for __5__ seconds. Repeat __5-10__ times; __1-2__ times a day. Once this becomes easier progress to third picture bringing affected arm towards ear by staying out to side. Same hold for _5_seconds. Repeat  _5-10_ times, _1-2_ times/day. ° °Shoulder Blade Stretch ° ° ° °Clasp fingers behind head with elbows touching in front of face. Pull elbows back while pressing shoulder blades together. Relax and hold as tolerated, can place pillow under elbow here for comfort as needed and to allow for prolonged stretch.  °Repeat __5__ times. Do __1-2__ sessions per day. ° ° ° ° °SHOULDER: External Rotation - Supine (Cane) ° ° ° °Hold cane with both hands. Rotate arm away from body. Keep elbow on floor and next to body. _5-10__ reps per set, hold 5 seconds, _1-2__ sets per day. °Add towel to keep elbow at side. ° °Copyright © [employer]. All rights reserved.  ° ° ° °Flexion (Eccentric) - Active-Assist (Cane)          Cancer Rehab [phone] ° ° ° °Use unaffected arm to push affected arm forward. Avoid hiking shoulder (shoulder should NOT touch cheek). Keep palm relaxed. Slowly lower affected arm. °Hold stretch for _5_ seconds repeating _5-10_ times, _1-2_ times a day. ° °Abduction (Eccentric) - Active-Assist (Cane) ° ° ° °Use unaffected arm to push affected arm out to side. Avoid hiking shoulder (shoulder should NOT touch cheek). Keep palm relaxed. Slowly lower affected arm. °Hold stretch _5_ seconds repeating _5-10_ times, _1-2_ times a day. ° °Cane Exercise: Extension ° ° °Stand holding cane behind back with both hands palm-up. Lift the cane away from  body until gentle stretch felt. Do NOT lean forward.  Hold __5__ seconds. °Repeat _5-10___ times. Do _1-2___ sessions per day. ° °CHEST: Doorway, Bilateral - Standing ° ° ° °Standing in doorway, place hands on wall with elbows bent at shoulder height and place one foot in front of other. Shift weight onto front foot. Hold _10-20__ seconds. °Do _3-5_ times, _1-2_ times a day. ° ° °  °

## 2018-08-15 NOTE — Therapy (Signed)
[place], [place], [address] Phone: [phone]   Fax:  [phone]  Physical Therapy Treatment  Patient Details  Name: Brooke [name] MRN: [id number] Date of Birth: May 25, 1950 Referring Provider (PT): Dr. Autumn Messing   Encounter Date: 08/15/2018  PT End of Session - 08/15/18 1648    Visit Number  8    Number of Visits  9    Date for PT Re-Evaluation  08/17/18    PT Start Time  1435    PT Stop Time  1525    PT Time Calculation (min)  50 min    Activity Tolerance  Patient tolerated treatment well    Behavior During Therapy  [facility] for tasks assessed/performed       Past Medical History:  Diagnosis Date  . Anemia   . Anxiety   . Arthritis   . Bilateral dry eyes   . Breast cancer, left breast ([place])   . Family history of breast cancer 02/02/2017  . Family history of breast cancer   . Family history of colon cancer 02/02/2017  . Family history of colon cancer   . Family history of prostate cancer   . Family history of prostate cancer in father 02/02/2017  . Family history of uterine cancer   . Fibroid   . History of kidney stones   . Hyperlipidemia   . Kidney stones   . Melanoma of upper arm ([place]) 2009   RIGHT ARM   . Obesity (BMI 30.0-34.9)   . Osteopenia 09/2017   T score -1.9 max 8% / 0.8% statistically significant decline at right and left hip stable at the spine  . PONV (postoperative nausea and vomiting)   . Squamous carcinoma     of the skin  . UTI (urinary tract infection)   . Varicose veins of both lower extremities   . Vitamin D deficiency 07/2009   LOW VITAMIN D 29  . Vitamin deficiency 07/2008   VITAMIN D LOW 23    Past Surgical History:  Procedure Laterality Date  . ABDOMINAL SURGERY  1975   ovairan cyst   . APPENDECTOMY    . BREAST LUMPECTOMY WITH RADIOACTIVE SEED LOCALIZATION Right 09/25/2017   Procedure: RIGHT BREAST LUMPECTOMY WITH RADIOACTIVE SEED LOCALIZATION;  Surgeon:  Jovita Kussmaul, MD;  Location: [place];  Service: General;  Laterality: Right;  . BREAST SURGERY     bio  . CHOLECYSTECTOMY    . DERMOID CYST  EXCISION    . DILATION AND CURETTAGE OF UTERUS  1998  . GALLBLADDER SURGERY    . HYSTEROSCOPY    . insertion  port a cath  03/02/2016  . MASTECTOMY MODIFIED RADICAL Left 09/25/2017   Procedure: LEFT MODIFIED RADICAL MASTECTOMY;  Surgeon: Jovita Kussmaul, MD;  Location: [place];  Service: General;  Laterality: Left;  . PORTACATH PLACEMENT N/A 03/02/2017   Procedure: INSERTION PORT-A-CATH;  Surgeon: Jovita Kussmaul, MD;  Location: [place];  Service: General;  Laterality: N/A;  . SKIN BIOPSY    . SKIN CANCER EXCISION  2010,2011   2010-RIGHT ARM, 2011-LEFT LOWER LEG.-DR Rolm Bookbinder DERM. DR. Sarajane Jews IS HER DERM SURGEON.    There were no vitals filed for this visit.  Subjective Assessment - 08/15/18 1643    Subjective  Pt states her shoulder is so much better since she has been receiving soft tissue work.  She brings in her circular knit sleeve today    Pertinent History  Neoadjuvant  chemotherapy early 2019; left mastectomy and ALND (9/13 nodes positive) on 09/25/17. Pt also had 33 treatments of radiation and previous PT .  She does not have a compression bra . She has a compression sleeve but she has not worn it.  She is limited in walking by painful knees     Currently in Pain?  No/denies                       [facility] Adult PT Treatment/Exercise - 08/15/18 0001      Exercises   Exercises  Shoulder      Shoulder Exercises: Supine   Other Supine Exercises  dowel rod flexion and abduction stretches       Shoulder Exercises: Standing   Other Standing Exercises  dowel rod flexion and abduction stretches       Manual Therapy   Edema Management  donned juzo 20-30 compression sleeve and showed pt how to use a Medi butler.  This sleeve does not contain her well at all and actually creased in above the full area at medial elbow.  Feel pt would be  best served with a velcro UE sleeve. Pt is a candidate for [employer] as she has Medicare.  Will send infomration to [facility] to get measured next week     Soft tissue mobilization  with pt in right sidelying and thick massage cream, soft tissue work to tight area at left latissimus and pec major muscle as well as posterior and upper shoulder and neck                   PT Long Term Goals - 07/17/18 1907      PT LONG TERM GOAL #1   Title  Pt will have 150 degrees of painfree shoulder abduction so that she can perform her ADLS easier     Baseline  125 degrees with c/o pain from cording     Time  4    Period  Weeks    Status  New      PT LONG TERM GOAL #2   Title  Pt will be independent in home exercise program and progression for shoulder strength     Time  4    Period  Weeks    Status  New      PT LONG TERM GOAL #3   Title  Pt will reports tenderness to palpation in anterior and posterior shoulder is decreased by 50 %     Time  4    Period  Weeks    Status  New            Plan - 08/15/18 1649    Clinical Impression Statement  pt current sleeve ( Juzo 20-30) is not adequate for the swelling in her left arm.  She lives alone and will have difficutly donning a flat knit garment . Feel she would benefit from a velcro garment like juzo wrap, juxtlite or farrow type of wrap. Added dowel streches today.  Pt will need renewal next visit    PT Treatment/Interventions  ADLs/Self Care Home Management;Therapeutic exercise;DME Instruction;Patient/family education;Manual techniques;Passive range of motion;Scar mobilization;Manual lymph drainage    PT Next Visit Plan  Assess goals and renew next visit/  Make sure pt got measured for gament from [employer] Soft tissue to work to left upper quadrant focusing on tender areas at latts and pec major . Include scar mobilizaion, myofascial release and manual lymph drainage as needed.  Stretching to left arm cording.  Check her sleeve when she  brings it in.  consider Tribute wrap garment    Consulted and Agree with Plan of Care  Patient       Patient will benefit from skilled therapeutic intervention in order to improve the following deficits and impairments:  Decreased skin integrity, Decreased scar mobility, Decreased knowledge of precautions, Decreased knowledge of use of DME, Increased fascial restricitons, Impaired UE functional use, Pain, Decreased range of motion, Postural dysfunction  Visit Diagnosis: 1. Abnormal posture   2. Aftercare following surgery for neoplasm   3. Stiffness of left shoulder, not elsewhere classified   4. Disorder of the skin and subcutaneous tissue related to radiation, unspecified        Problem List Patient Active Problem List   Diagnosis Date Noted  . Genetic testing 12/14/2017  . Family history of uterine cancer   . Family history of prostate cancer   . Melanoma in situ of right upper extremity ([place]) 07/18/2017  . Port-A-Cath in place 04/18/2017  . Drug-induced neutropenia ([place]) 03/09/2017  . Family history of breast cancer 02/02/2017  . Family history of colon cancer 02/02/2017  . Family history of prostate cancer in father 02/02/2017  . Malignant neoplasm of overlapping sites of left breast in female, estrogen receptor positive ([place]) 01/31/2017  . Hx of hot flashes, menopausal, HRT in the past 12/19/2011  . Osteopenia, stable, DEXA 2013, recs to repeat in 2018 12/19/2011  . Hx of Hyperlipidemia, LDL 144 in 07/2011 12/19/2011  . Vitamin D insufficiency 12/19/2011  . Fibroid    Donato Heinz. Owens Shark PT  [place]wood Levo 08/15/2018, 4:52 PM  [place], [place], [address] Phone: [phone]   Fax:  [phone]  Name: Brooke [name] MRN: [id number] Date of Birth: 1951/01/20

## 2018-08-21 ENCOUNTER — Other Ambulatory Visit: Payer: Self-pay

## 2018-08-21 ENCOUNTER — Encounter: Payer: Self-pay | Admitting: *Deleted

## 2018-08-21 ENCOUNTER — Ambulatory Visit: Payer: Medicare Other | Admitting: Physical Therapy

## 2018-08-21 DIAGNOSIS — M25612 Stiffness of left shoulder, not elsewhere classified: Secondary | ICD-10-CM

## 2018-08-21 DIAGNOSIS — R293 Abnormal posture: Secondary | ICD-10-CM

## 2018-08-21 DIAGNOSIS — Z483 Aftercare following surgery for neoplasm: Secondary | ICD-10-CM

## 2018-08-21 DIAGNOSIS — L599 Disorder of the skin and subcutaneous tissue related to radiation, unspecified: Secondary | ICD-10-CM

## 2018-08-21 NOTE — Therapy (Signed)
[place], [place], [address] Phone: [phone]   Fax:  [phone]  Physical Therapy Treatment  Patient Details  Name: Brooke [name] MRN: [id number] Date of Birth: [birth year] Referring Provider (PT): Dr. Autumn Messing   Encounter Date: 08/21/2018  PT End of Session - 08/21/18 1056    Visit Number  9    Number of Visits  17    Date for PT Re-Evaluation  09/21/18    PT Start Time  1000    PT Stop Time  1050    PT Time Calculation (min)  50 min    Activity Tolerance  Patient tolerated treatment well    Behavior During Therapy  [facility] for tasks assessed/performed       Past Medical History:  Diagnosis Date  . Anemia   . Anxiety   . Arthritis   . Bilateral dry eyes   . Breast cancer, left breast ([place])   . Family history of breast cancer 02/02/2017  . Family history of breast cancer   . Family history of colon cancer 02/02/2017  . Family history of colon cancer   . Family history of prostate cancer   . Family history of prostate cancer in father 02/02/2017  . Family history of uterine cancer   . Fibroid   . History of kidney stones   . Hyperlipidemia   . Kidney stones   . Melanoma of upper arm ([place]) 2009   RIGHT ARM   . Obesity (BMI 30.0-34.9)   . Osteopenia 09/2017   T score -1.9 max 8% / 0.8% statistically significant decline at right and left hip stable at the spine  . PONV (postoperative nausea and vomiting)   . Squamous carcinoma     of the skin  . UTI (urinary tract infection)   . Varicose veins of both lower extremities   . Vitamin D deficiency 07/2009   LOW VITAMIN D 29  . Vitamin deficiency 07/2008   VITAMIN D LOW 23    Past Surgical History:  Procedure Laterality Date  . ABDOMINAL SURGERY  1975   ovairan cyst   . APPENDECTOMY    . BREAST LUMPECTOMY WITH RADIOACTIVE SEED LOCALIZATION Right 09/25/2017   Procedure: RIGHT BREAST LUMPECTOMY WITH RADIOACTIVE SEED LOCALIZATION;   Surgeon: Jovita Kussmaul, MD;  Location: [place];  Service: General;  Laterality: Right;  . BREAST SURGERY     bio  . CHOLECYSTECTOMY    . DERMOID CYST  EXCISION    . DILATION AND CURETTAGE OF UTERUS  1998  . GALLBLADDER SURGERY    . HYSTEROSCOPY    . insertion  port a cath  03/02/2016  . MASTECTOMY MODIFIED RADICAL Left 09/25/2017   Procedure: LEFT MODIFIED RADICAL MASTECTOMY;  Surgeon: Jovita Kussmaul, MD;  Location: [place];  Service: General;  Laterality: Left;  . PORTACATH PLACEMENT N/A 03/02/2017   Procedure: INSERTION PORT-A-CATH;  Surgeon: Jovita Kussmaul, MD;  Location: [place];  Service: General;  Laterality: N/A;  . SKIN BIOPSY    . SKIN CANCER EXCISION  2010,2011   2010-RIGHT ARM, 2011-LEFT LOWER LEG.-DR Rolm Bookbinder DERM. DR. Sarajane Jews IS HER DERM SURGEON.    There were no vitals filed for this visit.  Subjective Assessment - 08/21/18 1004    Subjective  Pt got measured for a ReadyWrap velcro compression sleeve yesterday to help contol the swelling in the her left medial arm.  She feels like she has made good improvemnt in her  left axilla and lateral chest. She had a sharp stinging sensation in lefg lateral chest and she still has some fullness in her left medial arm just above the elbow that comes and goes.  The compression sleeve she had from before does not fit her and should not be worn at this time.    Pertinent History  Neoadjuvant chemotherapy early 2019; left mastectomy and ALND (9/13 nodes positive) on 09/25/17. Pt also had 33 treatments of radiation and previous PT .  She does not have a compression bra . She has a compression sleeve but she has not worn it.  She is limited in walking by painful knees     Patient Stated Goals  to get rid of the cording in her arm and learn an exercise program so she can go to the gym when they reopen  6/23 update she feels like her cording has gotten better down her lower arm,but she wantst to get rid of the binding down in her her axilla    Currently  in Pain?  No/denies   but she does have tightness and awareness all the time   Pain Location  Chest    Pain Orientation  Left    Pain Descriptors / Indicators  Tightness    Pain Type  Chronic pain    Pain Onset  More than a month ago    Pain Frequency  Intermittent    Aggravating Factors   reaching    Pain Relieving Factors  PT has helped         [facility] PT Assessment - 08/21/18 0001      Assessment   Medical Diagnosis  s/p left mastectomy and ALND    Referring Provider (PT)  Dr. Autumn Messing    Onset Date/Surgical Date  09/25/17      Prior Function   Level of Independence  Independent      AROM   Left Shoulder ABduction  135 Degrees   pulling down in left trunk                   [facility] Adult PT Treatment/Exercise - 08/21/18 0001      Shoulder Exercises: Pulleys   Flexion  --   5 reps x2 slowly   Flexion Limitations  deep breath at the top     Scaption  --   5 reps x 2    ABduction  --   5 reps x 2      Shoulder Exercises: ROM/Strengthening   Wall Wash  with folded towel up into flexion     Ball on Wall  10 reps with overpressure at the top     Other ROM/Strengthening Exercises  modified downward dog on the wall       Manual Therapy   Soft tissue mobilization  with pt in right sidelying and thick massage cream, soft tissue work to tight area at left latissimus and pec major muscle as well as posterior and upper shoulder and neck                   PT Long Term Goals - 08/21/18 1015      PT LONG TERM GOAL #1   Title  Pt will have 150 degrees of painfree shoulder abduction so that she can perform her ADLS easier     Baseline  125 degrees with c/o pain from cording at eval , on 6/23 pt has 135 degrees with pulling in her her back  Time  4    Period  Weeks    Status  On-going      PT LONG TERM GOAL #2   Title  Pt will be independent in home exercise program and progression for shoulder strength     Time  4    Status  On-going      PT LONG  TERM GOAL #3   Title  Pt will reports tenderness to palpation in anterior and posterior shoulder is decreased by 50 %     Baseline  pt says the pan and tenderness is better, but tightness is still there    Status  Achieved      PT LONG TERM GOAL #4   Title  She will report tight in front of chest at pec major muscle is decreased by 50%    Time  4    Period  Weeks    Status  New      PT LONG TERM GOAL #5   Title  Patient will verbalize good understanding of lymphedema risk reduction practices and be able to manage her left arm swelling with self MLD, compression and exercise 4    Period  Weeks    Status  New            Plan - 08/21/18 1057    Clinical Impression Statement  Brooke [name] has made progress with decreased pain in left arm and now has a compression garment on order, but she still has significant muscle tightness and trigger points in left shoulder chest and trunk with decreased shoulder range of motion . She needs to have continued PT and renewal was sent to Dr Marlou Starks for 4 more weeks.    Clinical Impairments Affecting Rehab Potential  extensive radiation ending 12/21/17    PT Frequency  2x / week    PT Duration  4 weeks    PT Next Visit Plan  Advane ROM, stretching and strength exercises. continue  Soft tissue to work to left upper quadrant focusing on tender areas at latts and pec major . Include scar mobilizaion, myofascial release and manual lymph drainage as needed. educate in use of compression garment when it arrives wtih lymphedema risk reduction    PT Home Exercise Plan  dowel stretches for shoulder , wall washes for stretching    Consulted and Agree with Plan of Care  Patient       Patient will benefit from skilled therapeutic intervention in order to improve the following deficits and impairments:  Decreased skin integrity, Decreased scar mobility, Decreased knowledge of precautions, Decreased knowledge of use of DME, Increased fascial restricitons, Impaired UE  functional use, Pain, Decreased range of motion, Postural dysfunction  Visit Diagnosis: 1. Abnormal posture   2. Aftercare following surgery for neoplasm   3. Stiffness of left shoulder, not elsewhere classified   4. Disorder of the skin and subcutaneous tissue related to radiation, unspecified        Problem List Patient Active Problem List   Diagnosis Date Noted  . Genetic testing 12/14/2017  . Family history of uterine cancer   . Family history of prostate cancer   . Melanoma in situ of right upper extremity ([place]) 07/18/2017  . Port-A-Cath in place 04/18/2017  . Drug-induced neutropenia ([place]) 03/09/2017  . Family history of breast cancer 02/02/2017  . Family history of colon cancer 02/02/2017  . Family history of prostate cancer in father 02/02/2017  . Malignant neoplasm of overlapping sites of left breast in female,  estrogen receptor positive ([place]) 01/31/2017  . Hx of hot flashes, menopausal, HRT in the past 12/19/2011  . Osteopenia, stable, DEXA 2013, recs to repeat in 2018 12/19/2011  . Hx of Hyperlipidemia, LDL 144 in 07/2011 12/19/2011  . Vitamin D insufficiency 12/19/2011  . Fibroid    Donato Heinz. Owens Shark PT  [place] Levo 08/21/2018, 11:02 AM  [place], [place], [address] Phone: [phone]   Fax:  [phone]  Name: Brooke [name] MRN: [id number] Date of Birth: 1950/10/03

## 2018-08-23 ENCOUNTER — Encounter: Payer: Self-pay | Admitting: Physical Therapy

## 2018-08-23 ENCOUNTER — Other Ambulatory Visit: Payer: Self-pay

## 2018-08-23 ENCOUNTER — Ambulatory Visit: Payer: Medicare Other | Admitting: Physical Therapy

## 2018-08-23 DIAGNOSIS — L599 Disorder of the skin and subcutaneous tissue related to radiation, unspecified: Secondary | ICD-10-CM

## 2018-08-23 DIAGNOSIS — Z483 Aftercare following surgery for neoplasm: Secondary | ICD-10-CM

## 2018-08-23 DIAGNOSIS — R293 Abnormal posture: Secondary | ICD-10-CM | POA: Diagnosis not present

## 2018-08-23 DIAGNOSIS — M25612 Stiffness of left shoulder, not elsewhere classified: Secondary | ICD-10-CM

## 2018-08-23 NOTE — Patient Instructions (Signed)

## 2018-08-23 NOTE — Therapy (Signed)
[place], [place], [address] Phone: [phone]   Fax:  [phone]  Physical Therapy Treatment    Progress Note Reporting Period 07/17/2018  to 08/23/2018  See note below for Objective Data and Assessment of Progress/Goals.     Patient Details  Name: Brooke [name] MRN: [id number] Date of Birth: 21-Nov-1950 Referring Provider (PT): Dr. Autumn Messing  Encounter Date: 08/23/2018  PT End of Session - 08/23/18 1058    Visit Number  10    Number of Visits  17    Date for PT Re-Evaluation  09/21/18    PT Start Time  1000    PT Stop Time  1050    PT Time Calculation (min)  50 min    Activity Tolerance  Patient tolerated treatment well    Behavior During Therapy  [facility] for tasks assessed/performed       Past Medical History:  Diagnosis Date  . Anemia   . Anxiety   . Arthritis   . Bilateral dry eyes   . Breast cancer, left breast ([place])   . Family history of breast cancer 02/02/2017  . Family history of breast cancer   . Family history of colon cancer 02/02/2017  . Family history of colon cancer   . Family history of prostate cancer   . Family history of prostate cancer in father 02/02/2017  . Family history of uterine cancer   . Fibroid   . History of kidney stones   . Hyperlipidemia   . Kidney stones   . Melanoma of upper arm ([place]) 2009   RIGHT ARM   . Obesity (BMI 30.0-34.9)   . Osteopenia 09/2017   T score -1.9 max 8% / 0.8% statistically significant decline at right and left hip stable at the spine  . PONV (postoperative nausea and vomiting)   . Squamous carcinoma     of the skin  . UTI (urinary tract infection)   . Varicose veins of both lower extremities   . Vitamin D deficiency 07/2009   LOW VITAMIN D 29  . Vitamin deficiency 07/2008   VITAMIN D LOW 23    Past Surgical History:  Procedure Laterality Date  . ABDOMINAL SURGERY  1975   ovairan cyst   . APPENDECTOMY    . BREAST  LUMPECTOMY WITH RADIOACTIVE SEED LOCALIZATION Right 09/25/2017   Procedure: RIGHT BREAST LUMPECTOMY WITH RADIOACTIVE SEED LOCALIZATION;  Surgeon: Jovita Kussmaul, MD;  Location: [place];  Service: General;  Laterality: Right;  . BREAST SURGERY     bio  . CHOLECYSTECTOMY    . DERMOID CYST  EXCISION    . DILATION AND CURETTAGE OF UTERUS  1998  . GALLBLADDER SURGERY    . HYSTEROSCOPY    . insertion  port a cath  03/02/2016  . MASTECTOMY MODIFIED RADICAL Left 09/25/2017   Procedure: LEFT MODIFIED RADICAL MASTECTOMY;  Surgeon: Jovita Kussmaul, MD;  Location: [place];  Service: General;  Laterality: Left;  . PORTACATH PLACEMENT N/A 03/02/2017   Procedure: INSERTION PORT-A-CATH;  Surgeon: Jovita Kussmaul, MD;  Location: [place];  Service: General;  Laterality: N/A;  . SKIN BIOPSY    . SKIN CANCER EXCISION  2010,2011   2010-RIGHT ARM, 2011-LEFT LOWER LEG.-DR Rolm Bookbinder DERM. DR. Sarajane Jews IS HER DERM SURGEON.    There were no vitals filed for this visit.  Subjective Assessment - 08/23/18 1006    Subjective  Pt feels that she is doing better.  She is ready to progress to strengthening    Pertinent History  Neoadjuvant chemotherapy early 2019; left mastectomy and ALND (9/13 nodes positive) on 09/25/17. Pt also had 33 treatments of radiation and previous PT .  She does not have a compression bra . She has a compression sleeve but she has not worn it.  She is limited in walking by painful knees     Patient Stated Goals  to get rid of the cording in her arm and learn an exercise program so she can go to the gym when they reopen  6/23 update she feels like her cording has gotten better down her lower arm,but she wantst to get rid of the binding down in her her axilla    Currently in Pain?  No/denies                       [facility] Adult PT Treatment/Exercise - 08/23/18 0001      Exercises   Exercises  Shoulder    Other Exercises   neck and upper thoracic stretches including ROM, cat/cow, "polish your  halo"       Shoulder Exercises: Pulleys   Flexion  2 minutes    Flexion Limitations  pt feels stretches down into her side body     Scaption  2 minutes      Shoulder Exercises: ROM/Strengthening   Wall Wash  with folded towel to several points on the wall and return ( eg: 11:00, 10:00 etc) and then circumduction with stretch into end range of flexion       Shoulder Exercises: Isometric Strengthening   Flexion  5X5"    Extension  5X5"    ABduction  5X5"      Manual Therapy   Soft tissue mobilization  with pt in right sidelying and thick massage cream, soft tissue work to tight area at left latissimus and pec major muscle as well as posterior and upper shoulder and neck              PT Education - 08/23/18 1058    Education provided  Yes    Education Details  shoulder isometrics    Person(s) Educated  Patient    Methods  Explanation;Demonstration;Handout    Comprehension  Verbalized understanding;Returned demonstration          PT Long Term Goals - 08/21/18 1015      PT LONG TERM GOAL #1   Title  Pt will have 150 degrees of painfree shoulder abduction so that she can perform her ADLS easier     Baseline  125 degrees with c/o pain from cording at eval , on 6/23 pt has 135 degrees with pulling in her her back    Time  4    Period  Weeks    Status  On-going      PT LONG TERM GOAL #2   Title  Pt will be independent in home exercise program and progression for shoulder strength     Time  4    Status  On-going      PT LONG TERM GOAL #3   Title  Pt will reports tenderness to palpation in anterior and posterior shoulder is decreased by 50 %     Baseline  pt says the pan and tenderness is better, but tightness is still there    Status  Achieved      PT LONG TERM GOAL #4   Title  She will report tight  in front of chest at [place] major muscle is decreased by 50%    Time  4    Period  Weeks    Status  New      PT LONG TERM GOAL #5   Title  Patient will verbalize good  understanding of lymphedema risk reduction practices and be able to manage her left arm swelling with self MLD, compression and exercise 4    Period  Weeks    Status  New            Plan - 08/23/18 1058    Clinical Impression Statement  upgraded home program to include isomtric strengthening to shoulder as well as end range wall stretches.  Also instructed in isometrics for glutes and quads .  She is ready to progress to strengthening she agreed to do the isometrics twice a day til next visit to see if she gets relief from strengthening    PT Frequency  2x / week    PT Duration  4 weeks    PT Treatment/Interventions  ADLs/Self Care Home Management;Therapeutic exercise;DME Instruction;Patient/family education;Manual techniques;Passive range of motion;Scar mobilization;Manual lymph drainage    PT Next Visit Plan  check to see how pt did with idometrics and progress to supine scapula and  strength exercises. continue  Soft tissue to work to left upper quadrant focusing on tender areas at latts and pec major . Include scar mobilizaion, myofascial release and manual lymph drainage as needed. educate in use of compression garment when it arrives wtih lymphedema risk reduction    PT Home Exercise Plan  dowel stretches for shoulder , wall washes for stretching, isometric shoulder exercises    Consulted and Agree with Plan of Care  Patient       Patient will benefit from skilled therapeutic intervention in order to improve the following deficits and impairments:  Decreased skin integrity, Decreased scar mobility, Decreased knowledge of precautions, Decreased knowledge of use of DME, Increased fascial restricitons, Impaired UE functional use, Pain, Decreased range of motion, Postural dysfunction  Visit Diagnosis: 1. Abnormal posture   2. Aftercare following surgery for neoplasm   3. Stiffness of left shoulder, not elsewhere classified   4. Disorder of the skin and subcutaneous tissue related to  radiation, unspecified        Problem List Patient Active Problem List   Diagnosis Date Noted  . Genetic testing 12/14/2017  . Family history of uterine cancer   . Family history of prostate cancer   . Melanoma in situ of right upper extremity ([place]) 07/18/2017  . Port-A-Cath in place 04/18/2017  . Drug-induced neutropenia ([place]) 03/09/2017  . Family history of breast cancer 02/02/2017  . Family history of colon cancer 02/02/2017  . Family history of prostate cancer in father 02/02/2017  . Malignant neoplasm of overlapping sites of left breast in female, estrogen receptor positive ([place]) 01/31/2017  . Hx of hot flashes, menopausal, HRT in the past 12/19/2011  . Osteopenia, stable, DEXA 2013, recs to repeat in 2018 12/19/2011  . Hx of Hyperlipidemia, LDL 144 in 07/2011 12/19/2011  . Vitamin D insufficiency 12/19/2011  . Fibroid    Donato Heinz. Owens Shark PT  Norwood Levo 08/23/2018, 11:09 AM  [place], [place], [address] Phone: [phone]   Fax:  [phone]  Name: Brooke [name] MRN: [id number] Date of Birth: August 03, 1950

## 2018-08-29 ENCOUNTER — Ambulatory Visit: Payer: Medicare Other | Attending: General Surgery | Admitting: Physical Therapy

## 2018-08-29 ENCOUNTER — Encounter: Payer: Self-pay | Admitting: Physical Therapy

## 2018-08-29 ENCOUNTER — Other Ambulatory Visit: Payer: Self-pay

## 2018-08-29 DIAGNOSIS — M25612 Stiffness of left shoulder, not elsewhere classified: Secondary | ICD-10-CM | POA: Insufficient documentation

## 2018-08-29 DIAGNOSIS — R293 Abnormal posture: Secondary | ICD-10-CM | POA: Diagnosis not present

## 2018-08-29 DIAGNOSIS — L599 Disorder of the skin and subcutaneous tissue related to radiation, unspecified: Secondary | ICD-10-CM | POA: Diagnosis present

## 2018-08-29 DIAGNOSIS — Z483 Aftercare following surgery for neoplasm: Secondary | ICD-10-CM | POA: Diagnosis present

## 2018-08-29 NOTE — Therapy (Signed)
[place], [place], [address] Phone: [phone]   Fax:  [phone]  Physical Therapy Treatment  Patient Details  Name: Brooke [name] MRN: [id number] Date of Birth: [birth year] Referring Provider (PT): Dr. Autumn Messing   Encounter Date: 08/29/2018  PT End of Session - 08/29/18 1426    Visit Number  11    Number of Visits  17    Date for PT Re-Evaluation  09/21/18    PT Start Time  1330    PT Stop Time  1415    PT Time Calculation (min)  45 min    Activity Tolerance  Patient tolerated treatment well    Behavior During Therapy  [facility] for tasks assessed/performed       Past Medical History:  Diagnosis Date  . Anemia   . Anxiety   . Arthritis   . Bilateral dry eyes   . Breast cancer, left breast ([place])   . Family history of breast cancer 02/02/2017  . Family history of breast cancer   . Family history of colon cancer 02/02/2017  . Family history of colon cancer   . Family history of prostate cancer   . Family history of prostate cancer in father 02/02/2017  . Family history of uterine cancer   . Fibroid   . History of kidney stones   . Hyperlipidemia   . Kidney stones   . Melanoma of upper arm ([place]) 2009   RIGHT ARM   . Obesity (BMI 30.0-34.9)   . Osteopenia 09/2017   T score -1.9 max 8% / 0.8% statistically significant decline at right and left hip stable at the spine  . PONV (postoperative nausea and vomiting)   . Squamous carcinoma     of the skin  . UTI (urinary tract infection)   . Varicose veins of both lower extremities   . Vitamin D deficiency 07/2009   LOW VITAMIN D 29  . Vitamin deficiency 07/2008   VITAMIN D LOW 23    Past Surgical History:  Procedure Laterality Date  . ABDOMINAL SURGERY  1975   ovairan cyst   . APPENDECTOMY    . BREAST LUMPECTOMY WITH RADIOACTIVE SEED LOCALIZATION Right 09/25/2017   Procedure: RIGHT BREAST LUMPECTOMY WITH RADIOACTIVE SEED LOCALIZATION;   Surgeon: Jovita Kussmaul, MD;  Location: [place];  Service: General;  Laterality: Right;  . BREAST SURGERY     bio  . CHOLECYSTECTOMY    . DERMOID CYST  EXCISION    . DILATION AND CURETTAGE OF UTERUS  1998  . GALLBLADDER SURGERY    . HYSTEROSCOPY    . insertion  port a cath  03/02/2016  . MASTECTOMY MODIFIED RADICAL Left 09/25/2017   Procedure: LEFT MODIFIED RADICAL MASTECTOMY;  Surgeon: Jovita Kussmaul, MD;  Location: [place];  Service: General;  Laterality: Left;  . PORTACATH PLACEMENT N/A 03/02/2017   Procedure: INSERTION PORT-A-CATH;  Surgeon: Jovita Kussmaul, MD;  Location: [place];  Service: General;  Laterality: N/A;  . SKIN BIOPSY    . SKIN CANCER EXCISION  2010,2011   2010-RIGHT ARM, 2011-LEFT LOWER LEG.-DR Rolm Bookbinder DERM. DR. Sarajane Jews IS HER DERM SURGEON.    There were no vitals filed for this visit.  Subjective Assessment - 08/29/18 1339    Subjective  "I have soreness from doing stuff" Pt brings in her exercise papers and reports she has been doing the isometrics and stretches    Pertinent History  Neoadjuvant chemotherapy early 2019;  left mastectomy and ALND (9/13 nodes positive) on 09/25/17. Pt also had 33 treatments of radiation and previous PT .  She does not have a compression bra . She has a compression sleeve but she has not worn it.  She is limited in walking by painful knees     Patient Stated Goals  to get rid of the cording in her arm and learn an exercise program so she can go to the gym when they reopen  6/23 update she feels like her cording has gotten better down her lower arm,but she wantst to get rid of the binding down in her her axilla    Currently in Pain?  No/denies   not really, a little sore from exercising                      [facility] Adult PT Treatment/Exercise - 08/29/18 0001      Shoulder Exercises: Supine   Horizontal ABduction  Strengthening;Right;Left;5 reps;Theraband    Theraband Level (Shoulder Horizontal ABduction)  Level 2 (Red)     External Rotation  Strengthening;Right;Left;5 reps;Theraband    Theraband Level (Shoulder External Rotation)  Level 2 (Red)    Flexion  Strengthening;Right;Left;5 reps;Theraband   narrow and wide grip    Theraband Level (Shoulder Flexion)  Level 2 (Red)    Diagonals  Strengthening;Right;Left;5 reps;Theraband    Theraband Level (Shoulder Diagonals)  Level 2 (Red)      Shoulder Exercises: Seated   Other Seated Exercises  neck and upper thoracic range of motion, cat/cow stretches       Manual Therapy   Soft tissue mobilization  with pt in right sidelying and thick massage cream, soft tissue work to tight area at left latissimus and pec major muscle as well as posterior and upper shoulder and neck              PT Education - 08/29/18 1425    Education provided  Yes    Education Details  supine scapular series with red theraband    Person(s) Educated  Patient    Methods  Explanation;Demonstration;Handout    Comprehension  Verbalized understanding;Returned demonstration          PT Long Term Goals - 08/21/18 1015      PT LONG TERM GOAL #1   Title  Pt will have 150 degrees of painfree shoulder abduction so that she can perform her ADLS easier     Baseline  125 degrees with c/o pain from cording at eval , on 6/23 pt has 135 degrees with pulling in her her back    Time  4    Period  Weeks    Status  On-going      PT LONG TERM GOAL #2   Title  Pt will be independent in home exercise program and progression for shoulder strength     Time  4    Status  On-going      PT LONG TERM GOAL #3   Title  Pt will reports tenderness to palpation in anterior and posterior shoulder is decreased by 50 %     Baseline  pt says the pan and tenderness is better, but tightness is still there    Status  Achieved      PT LONG TERM GOAL #4   Title  She will report tight in front of chest at pec major muscle is decreased by 50%    Time  4    Period  Weeks  Status  New      PT LONG TERM GOAL  #5   Title  Patient will verbalize good understanding of lymphedema risk reduction practices and be able to manage her left arm swelling with self MLD, compression and exercise 4    Period  Weeks    Status  New            Plan - 08/29/18 1427    Clinical Impression Statement  Pt is improving. Upgraded to supine scapular series today and pt was able to do well.  Pt had less tight trigger points in posterior shoulder area today.    Clinical Impairments Affecting Rehab Potential  extensive radiation ending 12/21/17    PT Frequency  2x / week    PT Treatment/Interventions  ADLs/Self Care Home Management;Therapeutic exercise;DME Instruction;Patient/family education;Manual techniques;Passive range of motion;Scar mobilization;Manual lymph drainage    PT Next Visit Plan  continue isometrics and  supine scapula and  strength exercises. continue  Soft tissue to work to left upper quadrant focusing on tender areas at latts and pec major . Include scar mobilizaion, myofascial release and manual lymph drainage as needed. educate in use of compression garment when it arrives wtih lymphedema risk reduction    PT Home Exercise Plan  dowel stretches for shoulder , wall washes for stretching, isometric shoulder exercises, supine scapular series    Consulted and Agree with Plan of Care  Patient       Patient will benefit from skilled therapeutic intervention in order to improve the following deficits and impairments:  Decreased skin integrity, Decreased scar mobility, Decreased knowledge of precautions, Decreased knowledge of use of DME, Increased fascial restricitons, Impaired UE functional use, Pain, Decreased range of motion, Postural dysfunction  Visit Diagnosis: 1. Abnormal posture   2. Aftercare following surgery for neoplasm   3. Stiffness of left shoulder, not elsewhere classified   4. Disorder of the skin and subcutaneous tissue related to radiation, unspecified        Problem List Patient  Active Problem List   Diagnosis Date Noted  . Genetic testing 12/14/2017  . Family history of uterine cancer   . Family history of prostate cancer   . Melanoma in situ of right upper extremity ([place]) 07/18/2017  . Port-A-Cath in place 04/18/2017  . Drug-induced neutropenia ([place]) 03/09/2017  . Family history of breast cancer 02/02/2017  . Family history of colon cancer 02/02/2017  . Family history of prostate cancer in father 02/02/2017  . Malignant neoplasm of overlapping sites of left breast in female, estrogen receptor positive ([place]) 01/31/2017  . Hx of hot flashes, menopausal, HRT in the past 12/19/2011  . Osteopenia, stable, DEXA 2013, recs to repeat in 2018 12/19/2011  . Hx of Hyperlipidemia, LDL 144 in 07/2011 12/19/2011  . Vitamin D insufficiency 12/19/2011  . Fibroid    Donato Heinz. Owens Shark PT  [place] Levo 08/29/2018, 2:30 PM  [place], [place], [address] Phone: [phone]   Fax:  [phone]  Name: Brooke [name] MRN: [id number] Date of Birth: 09-02-1950

## 2018-08-29 NOTE — Patient Instructions (Signed)

## 2018-09-03 ENCOUNTER — Encounter: Payer: Medicare Other | Admitting: Physical Therapy

## 2018-09-05 ENCOUNTER — Encounter: Payer: Medicare Other | Admitting: Physical Therapy

## 2018-09-10 ENCOUNTER — Encounter: Payer: Medicare Other | Admitting: Physical Therapy

## 2018-09-12 ENCOUNTER — Inpatient Hospital Stay: Payer: Medicare Other

## 2018-09-12 ENCOUNTER — Inpatient Hospital Stay: Payer: Medicare Other | Attending: Oncology | Admitting: Oncology

## 2018-09-12 ENCOUNTER — Other Ambulatory Visit: Payer: Self-pay

## 2018-09-12 VITALS — BP 161/102 | HR 98 | Temp 98.0°F | Ht 69.0 in | Wt 241.0 lb

## 2018-09-12 DIAGNOSIS — Z170 Estrogen receptor positive status [ER+]: Secondary | ICD-10-CM | POA: Diagnosis not present

## 2018-09-12 DIAGNOSIS — Z800 Family history of malignant neoplasm of digestive organs: Secondary | ICD-10-CM | POA: Diagnosis not present

## 2018-09-12 DIAGNOSIS — C50812 Malignant neoplasm of overlapping sites of left female breast: Secondary | ICD-10-CM

## 2018-09-12 DIAGNOSIS — Z8042 Family history of malignant neoplasm of prostate: Secondary | ICD-10-CM

## 2018-09-12 DIAGNOSIS — Z85828 Personal history of other malignant neoplasm of skin: Secondary | ICD-10-CM

## 2018-09-12 DIAGNOSIS — Z9012 Acquired absence of left breast and nipple: Secondary | ICD-10-CM

## 2018-09-12 DIAGNOSIS — Z803 Family history of malignant neoplasm of breast: Secondary | ICD-10-CM | POA: Diagnosis not present

## 2018-09-12 DIAGNOSIS — Z923 Personal history of irradiation: Secondary | ICD-10-CM | POA: Insufficient documentation

## 2018-09-12 DIAGNOSIS — Z79899 Other long term (current) drug therapy: Secondary | ICD-10-CM | POA: Diagnosis not present

## 2018-09-12 DIAGNOSIS — F419 Anxiety disorder, unspecified: Secondary | ICD-10-CM

## 2018-09-12 DIAGNOSIS — D0361 Melanoma in situ of right upper limb, including shoulder: Secondary | ICD-10-CM

## 2018-09-12 DIAGNOSIS — Z79810 Long term (current) use of selective estrogen receptor modulators (SERMs): Secondary | ICD-10-CM | POA: Diagnosis not present

## 2018-09-12 DIAGNOSIS — C50212 Malignant neoplasm of upper-inner quadrant of left female breast: Secondary | ICD-10-CM

## 2018-09-12 DIAGNOSIS — Z8049 Family history of malignant neoplasm of other genital organs: Secondary | ICD-10-CM | POA: Diagnosis not present

## 2018-09-12 LAB — CMP (CANCER CENTER ONLY)
ALT: 84 U/L — ABNORMAL HIGH (ref 0–44)
AST: 43 U/L — ABNORMAL HIGH (ref 15–41)
Albumin: 4.6 g/dL (ref 3.5–5.0)
Alkaline Phosphatase: 64 U/L (ref 38–126)
Anion gap: 13 (ref 5–15)
BUN: 11 mg/dL (ref 8–23)
CO2: 23 mmol/L (ref 22–32)
Calcium: 9.6 mg/dL (ref 8.9–10.3)
Chloride: 104 mmol/L (ref 98–111)
Creatinine: 0.94 mg/dL (ref 0.44–1.00)
GFR, Est AFR Am: >60 mL/min (ref >60)
GFR, Estimated: >60 mL/min (ref >60)
Glucose, Bld: 118 mg/dL — ABNORMAL HIGH (ref 70–99)
Potassium: 3.6 mmol/L (ref 3.5–5.1)
Sodium: 140 mmol/L (ref 135–145)
Total Bilirubin: 0.6 mg/dL (ref 0.3–1.2)
Total Protein: 7.5 g/dL (ref 6.5–8.1)

## 2018-09-12 NOTE — Progress Notes (Signed)
[place]  [phone] [phone]    ID: Brooke [name] DOB: 1951/01/19  MR#: [id number]  HYW#:[id number]  Patient Care Team: [name], MD as PCP - General (Family Medicine) Fontaine, Belinda Block, MD (Obstetrics and Gynecology) Rolm Bookbinder, MD as Attending Physician (Dermatology) Magrinat, Virgie Dad, MD as Consulting Physician (Oncology) Kyung Rudd, MD as Consulting Physician (Radiation Oncology) Jovita Kussmaul, MD as Consulting Physician (General Surgery) Irene Limbo, MD as Consulting Physician (Plastic Surgery) Delice Bison, Charlestine Massed, NP as Nurse Practitioner (Hematology and Oncology) OTHER MD:   CHIEF COMPLAINT: Estrogen receptor positive breast cancer (s/p left mastectomy)  CURRENT TREATMENT: Tamoxifen   INTERVAL HISTORY: Brooke [name] returns today for follow-up and treatment of her estrogen receptor positive breast cancer.  She continues on tamoxifen with fair tolerance. She reports hot flashes and states it's difficult to cool down once one starts. She does not take anything for hot flashes.  Since her last visit, she underwent right diagnostic mammography with tomography at [facility] on 05/03/2018 showing: breast density category B; no evidence of malignancy.   REVIEW OF SYSTEMS: Brooke [name] reports 2019 was a hard year for her, and the further she gets from it, the better she feels. She is in physical therapy for her arm and shoulder. She reports arthritis in her knee, which makes exercise difficult. A detailed review of systems was otherwise noncontributory.    HISTORY OF CURRENT ILLNESS: From the original intake note:  Brooke [name] herself palpated a change in her left breast and brought this to medical attention.  On 01/18/2017 she underwent bilateral diagnostic mammography with tomography and bilateral breast ultrasonography at [facility].  The breast density was category B.  At the most recent exam was from December 2016.  In the right  breast upper outer quadrant there was a 1 cm irregular mass associated with punctate calcification.  By ultrasound this was not well-defined, and it was biopsied on 01/26/2017 with tomographic guidance this showed a complex sclerosing lesion with the usual ductal hyperplasia.  The right axilla was sonographically  On the left mammography showed a 4 cm dense mass with indistinct margins in the upper inner quadrant.  Ultrasound defined a 3 cm irregular mass in the upper inner quadrant of the left breast which was palpable.  There was an abnormal lymph node in the left axilla.  Biopsy of the left mass and abnormal lymph node ([id number], on 01/26/2017) showed both to be involved by invasive ductal carcinoma, with some lobular features but E-cadherin positive, estrogen and progesterone receptors both 100% positive, with strong staining intensity, with MIB-1 between 15-20%.  There was no HER-2 amplification, the signals ratio being 1.31/1.58 and the number per cell 2.30/2.05  The patient's subsequent history is as detailed below.   PAST MEDICAL HISTORY: Past Medical History:  Diagnosis Date  . Anemia   . Anxiety   . Arthritis   . Bilateral dry eyes   . Breast cancer, left breast ([place])   . Family history of breast cancer 02/02/2017  . Family history of breast cancer   . Family history of colon cancer 02/02/2017  . Family history of colon cancer   . Family history of prostate cancer   . Family history of prostate cancer in father 02/02/2017  . Family history of uterine cancer   . Fibroid   . History of kidney stones   . Hyperlipidemia   . Kidney stones   . Melanoma of upper arm ([place]) 2009   RIGHT  ARM   . Obesity (BMI 30.0-34.9)   . Osteopenia 09/2017   T score -1.9 max 8% / 0.8% statistically significant decline at right and left hip stable at the spine  . PONV (postoperative nausea and vomiting)   . Squamous carcinoma     of the skin  . UTI (urinary tract infection)   . Varicose veins of  both lower extremities   . Vitamin D deficiency 07/2009   LOW VITAMIN D 29  . Vitamin deficiency 07/2008   VITAMIN D LOW 23    PAST SURGICAL HISTORY: Past Surgical History:  Procedure Laterality Date  . ABDOMINAL SURGERY  1975   ovairan cyst   . APPENDECTOMY    . BREAST LUMPECTOMY WITH RADIOACTIVE SEED LOCALIZATION Right 09/25/2017   Procedure: RIGHT BREAST LUMPECTOMY WITH RADIOACTIVE SEED LOCALIZATION;  Surgeon: Jovita Kussmaul, MD;  Location: [place];  Service: General;  Laterality: Right;  . BREAST SURGERY     bio  . CHOLECYSTECTOMY    . DERMOID CYST  EXCISION    . DILATION AND CURETTAGE OF UTERUS  1998  . GALLBLADDER SURGERY    . HYSTEROSCOPY    . insertion  port a cath  03/02/2016  . MASTECTOMY MODIFIED RADICAL Left 09/25/2017   Procedure: LEFT MODIFIED RADICAL MASTECTOMY;  Surgeon: Jovita Kussmaul, MD;  Location: [place];  Service: General;  Laterality: Left;  . PORTACATH PLACEMENT N/A 03/02/2017   Procedure: INSERTION PORT-A-CATH;  Surgeon: Jovita Kussmaul, MD;  Location: [place];  Service: General;  Laterality: N/A;  . SKIN BIOPSY    . SKIN CANCER EXCISION  2010,2011   2010-RIGHT ARM, 2011-LEFT LOWER LEG.-DR Rolm Bookbinder DERM. DR. Sarajane Jews IS HER DERM SURGEON.    FAMILY HISTORY Family History  Problem Relation Age of Onset  . Diabetes Father   . Hypertension Father   . Heart disease Father   . Prostate cancer Father 56  . Arthritis Father   . Breast cancer Sister 61       again at age 67, GT results unk  . Skin cancer Sister   . Colon cancer Paternal Grandfather 38  . Hypertension Mother   . Heart failure Mother   . Uterine cancer Sister 74       stage IV  . Breast cancer Cousin        mat first cousin dx under 96   The patient's father was diagnosed with prostate cancer in his 102s and died at age 47 from complications of that disease.  The patient's mother died at age 6 with congestive heart failure.  The patient had no brothers, 3 sisters.  One sister had breast cancer  at age 57 and again at age 21.  She has been genetically tested and was found to be BRCA not mutated in addition a paternal grandfather had colon cancer in his 36s.  There is no history of ovarian cancer in the family to the patient's knowledge.   GYNECOLOGIC HISTORY:  Patient's last menstrual period was 03/18/1997. Menarche age 23. The patient is GX P0.  She stopped having periods in 1999 and took hormone replacement approximately 5 years.   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  She is now retired.  She is single and lives alone with no pets.    ADVANCED DIRECTIVES: Not in place   HEALTH MAINTENANCE: Social History   Tobacco Use  . Smoking  status: Never Smoker  . Smokeless tobacco: Never Used  Substance Use Topics  . Alcohol use: No    Frequency: Never    Comment: rare  . Drug use: No    Colonoscopy: Never  PAP:  Bone density: 2017/osteopenia   Allergies  Allergen Reactions  . Neosporin [Neomycin-Bacitracin Zn-Polymyx] Swelling, Rash and Other (See Comments)    Blisters  . Benzalkonium Chloride Other (See Comments)    Redness (Pt is unaware)  It works by killing microorganisms and inhibiting their future growth, and for this reason frequently appears as an ingredient in antibacterial hand wipes, antiseptic creams and anti-itch ointments.  . Latex Rash    Current Outpatient Medications  Medication Sig Dispense Refill  . acetaminophen (TYLENOL) 500 MG tablet Take 500 mg by mouth every 6 (six) hours as needed for moderate pain or headache.    . alclomethasone (ACLOVATE) 0.05 % cream Apply to affected area twice daily as needed for dermatitis  0  . b complex vitamins tablet Take 1 tablet by mouth daily.    . betamethasone dipropionate (DIPROLENE) 0.05 % cream APPLY THIN LAYER BID FOR 3 TO 4 WEEKS  0  . ibuprofen (ADVIL,MOTRIN) 200 MG tablet Take 200 mg by  mouth every 6 (six) hours as needed for headache or moderate pain.    Marland Kitchen Lifitegrast (XIIDRA) 5 % SOLN Place 1 drop into both eyes daily as needed (dry eyes).    . Propylene Glycol (SYSTANE COMPLETE) 0.6 % SOLN Place 1 drop into both eyes daily.    . tamoxifen (NOLVADEX) 20 MG tablet Take 20 mg by mouth daily.    . vitamin C (ASCORBIC ACID) 500 MG tablet Take 1,000 mg by mouth daily.     Marland Kitchen VITAMIN D, CHOLECALCIFEROL, PO Take 6 drops by mouth every other day. 1 drop = 1000 international units     No current facility-administered medications for this visit.     OBJECTIVE: Middle-aged white woman in no acute distress  Vitals:   09/12/18 1448  BP: (!) 161/102  Pulse: 98  Temp: 98 F (36.7 C)  SpO2: 95%     Body mass index is 35.59 kg/m.   Wt Readings from Last 3 Encounters:  09/12/18 241 lb (109.3 kg)  04/11/18 254 lb 9.6 oz (115.5 kg)  02/27/18 245 lb 11.2 oz (111.4 kg)  ECOG FS:1 - Symptomatic but completely ambulatory  Sclerae unicteric, pupils round and equal Wearing a mask No cervical or supraclavicular adenopathy Lungs no rales or rhonchi Heart regular rate and rhythm Abd soft, nontender, positive bowel sounds MSK no focal spinal tenderness, no upper extremity lymphedema Neuro: nonfocal, well oriented, appropriate affect Breasts: The right breast is status post lumpectomy.  The left breast is status post mastectomy and radiation.  There is no evidence of chest wall recurrence.  Both axillae are benign.  LAB RESULTS:  CMP     Component Value Date/Time   NA 140 09/12/2018 1354   NA 141 03/03/2017 1358   K 3.6 09/12/2018 1354   K 3.3 (L) 03/03/2017 1358   CL 104 09/12/2018 1354   CO2 23 09/12/2018 1354   CO2 28 03/03/2017 1358   GLUCOSE 118 (H) 09/12/2018 1354   GLUCOSE 107 03/03/2017 1358   BUN 11 09/12/2018 1354   BUN 12.7 03/03/2017 1358   CREATININE 0.94 09/12/2018 1354   CREATININE 0.9 03/03/2017 1358   CALCIUM 9.6 09/12/2018 1354   CALCIUM 9.4 03/03/2017  1358   PROT 7.5 09/12/2018 1354  PROT 6.5 03/03/2017 1358   ALBUMIN 4.6 09/12/2018 1354   ALBUMIN 3.7 03/03/2017 1358   AST 43 (H) 09/12/2018 1354   AST 23 03/03/2017 1358   ALT 84 (H) 09/12/2018 1354   ALT 25 03/03/2017 1358   ALKPHOS 64 09/12/2018 1354   ALKPHOS 71 03/03/2017 1358   BILITOT 0.6 09/12/2018 1354   BILITOT 0.52 03/03/2017 1358   GFRNONAA >60 09/12/2018 1354   GFRAA >60 09/12/2018 1354    No results found for: TOTALPROTELP, ALBUMINELP, A1GS, A2GS, BETS, BETA2SER, GAMS, MSPIKE, SPEI  No results found for: Nils Pyle, [facility]  Lab Results  Component Value Date   WBC 5.0 08/13/2018   NEUTROABS 3.7 08/13/2018   HGB 13.4 08/13/2018   HCT 39.7 08/13/2018   MCV 89.0 08/13/2018   PLT 271.0 08/13/2018      Chemistry      Component Value Date/Time   NA 140 09/12/2018 1354   NA 141 03/03/2017 1358   K 3.6 09/12/2018 1354   K 3.3 (L) 03/03/2017 1358   CL 104 09/12/2018 1354   CO2 23 09/12/2018 1354   CO2 28 03/03/2017 1358   BUN 11 09/12/2018 1354   BUN 12.7 03/03/2017 1358   CREATININE 0.94 09/12/2018 1354   CREATININE 0.9 03/03/2017 1358      Component Value Date/Time   CALCIUM 9.6 09/12/2018 1354   CALCIUM 9.4 03/03/2017 1358   ALKPHOS 64 09/12/2018 1354   ALKPHOS 71 03/03/2017 1358   AST 43 (H) 09/12/2018 1354   AST 23 03/03/2017 1358   ALT 84 (H) 09/12/2018 1354   ALT 25 03/03/2017 1358   BILITOT 0.6 09/12/2018 1354   BILITOT 0.52 03/03/2017 1358       No results found for: LABCA2  No components found for: UXLKGM010  No results for input(s): INR in the last 168 hours.  No results found for: LABCA2  No results found for: UVO536  No results found for: UYQ034  No results found for: VQQ595  No results found for: CA2729  No components found for: HGQUANT  No results found for: CEA1 / No results found for: CEA1   No results found for: AFPTUMOR  No results found for: CHROMOGRNA  No results found for:  PSA1  Appointment on 09/12/2018  Component Date Value Ref Range Status  . Sodium 09/12/2018 140  135 - 145 mmol/L Final  . Potassium 09/12/2018 3.6  3.5 - 5.1 mmol/L Final  . Chloride 09/12/2018 104  98 - 111 mmol/L Final  . CO2 09/12/2018 23  22 - 32 mmol/L Final  . Glucose, Bld 09/12/2018 118* 70 - 99 mg/dL Final  . BUN 09/12/2018 11  8 - 23 mg/dL Final  . Creatinine 09/12/2018 0.94  0.44 - 1.00 mg/dL Final  . Calcium 09/12/2018 9.6  8.9 - 10.3 mg/dL Final  . Total Protein 09/12/2018 7.5  6.5 - 8.1 g/dL Final  . Albumin 09/12/2018 4.6  3.5 - 5.0 g/dL Final  . AST 09/12/2018 43* 15 - 41 U/L Final  . ALT 09/12/2018 84* 0 - 44 U/L Final  . Alkaline Phosphatase 09/12/2018 64  38 - 126 U/L Final  . Total Bilirubin 09/12/2018 0.6  0.3 - 1.2 mg/dL Final  . GFR, Est Non Af Am 09/12/2018 >60  >60 mL/min Final  . GFR, Est AFR Am 09/12/2018 >60  >60 mL/min Final  . Anion gap 09/12/2018 13  5 - 15 Final   Performed at [facility], [address] [place], [place] [address]    (this displays the last labs from the last 3 days)  No results found for: TOTALPROTELP, ALBUMINELP, A1GS, A2GS, BETS, BETA2SER, GAMS, MSPIKE, SPEI (this displays SPEP labs)  No results found for: KPAFRELGTCHN, LAMBDASER, KAPLAMBRATIO (kappa/lambda light chains)  No results found for: HGBA, HGBA2QUANT, HGBFQUANT, HGBSQUAN (Hemoglobinopathy evaluation)   No results found for: LDH  No results found for: IRON, TIBC, IRONPCTSAT (Iron and TIBC)  No results found for: FERRITIN  Urinalysis    Component Value Date/Time   COLORURINE YELLOW 05/29/2015 [place] 05/29/2015 1113   LABSPEC 1.023 05/29/2015 1113   PHURINE 5.5 05/29/2015 1113   GLUCOSEU NEGATIVE 05/29/2015 1113   GLUCOSEU NEGATIVE 11/05/2012 1559   HGBUR NEGATIVE 05/29/2015 1113   BILIRUBINUR neg 08/10/2018 1529   KETONESUR NEGATIVE 05/29/2015 1113   PROTEINUR Negative 08/10/2018 1529   PROTEINUR NEGATIVE  05/29/2015 1113   UROBILINOGEN 0.2 08/10/2018 1529   UROBILINOGEN 0.2 11/05/2012 1559   NITRITE neg 08/10/2018 1529   NITRITE NEGATIVE 05/29/2015 1113   LEUKOCYTESUR Negative 08/10/2018 1529     STUDIES: No results found.   ELIGIBLE FOR AVAILABLE RESEARCH PROTOCOL: no   ASSESSMENT: 68 y.o. [place] woman status post bilateral biopsies 01/26/2017, showing  (1) in the right breast, a complex sclerosing lesion, status post lumpectomy 09/25/2017, with no malignancy identified.  (2) in the left breast, a cT2 pN1 invasive ductal carcinoma (with some lobular features but E-cadherin positive), grade 2, estrogen and progesterone receptor positive, HER-2 not amplified, with an MIB-1 of 15-20%  (a) breast MRI 02/04/2017 suggests a T3 N1 tumor  (3) started neoadjuvant cyclophosphamide/docetaxel 03/03/2017, discontinued after 1 cycle with very poor tolerance  (a) started cyclophosphamide/methotrexate/fluorouracil (CMF) 03/28/2017, repeated x7 cycles, last dose 08/01/2017  (4) status post left lumpectomy and axillary lymph node dissection 09/25/17 for a ypT5 ypN2, stage IIIA invasive ductal carcinoma, grade 2, again estrogen and progesterone receptor positive, HER-2 not amplified  (5) adjuvant radiation completed 12/21/2017  (a) capecitabine sensitization tolerated only the initial week of radiation (b) Site/dose: The patient initially received a dose of 50.4 Gy in 28 fractions to the left chest wall and left supraclavicular region. This was delivered using a 3-D conformal, 4 field technique. The patient then received a boost to the mastectomy scar. This delivered an additional 10 Gy in 5 fractions using an en face electron field. The total dose was 60.4 Gy.  ( 6) tamoxifen started 03/09/2018  (7) genetics testing 12/12/2017 through the Multi-Gene Panel offered by [facility] found no deleterious mutations in AIP, ALK, APC, ATM, AXIN2,BAP1,  BARD1, BLM, BMPR1A, BRCA1, BRCA2, BRIP1, CASR, CDC73,  CDH1, CDK4, CDKN1B, CDKN1C, CDKN2A (p14ARF), CDKN2A (p16INK4a), CEBPA, CHEK2, CTNNA1, DICER1, DIS3L2, EGFR (c.2369C>T, p.Thr790Met variant only), EPCAM (Deletion/duplication testing only), FH, FLCN, GATA2, GPC3, GREM1 (Promoter region deletion/duplication testing only), HOXB13 (c.251G>A, p.Gly84Glu), HRAS, KIT, MAX, MEN1, MET, MITF (c.952G>A, p.Glu318Lys variant only), MLH1, MSH2, MSH3, MSH6, MUTYH, NBN, NF1, NF2, NTHL1, PALB2, PDGFRA, PHOX2B, PMS2, POLD1, POLE, POT1, PRKAR1A, PTCH1, PTEN, RAD50, RAD51C, RAD51D, RB1, RECQL4, RET, RUNX1, SDHAF2, SDHA (sequence changes only), SDHB, SDHC, SDHD, SMAD4, SMARCA4, SMARCB1, SMARCE1, STK11, SUFU, TERC, TERT, TMEM127, TP53, TSC1, TSC2, VHL, WRN and WT1.   (8) Foundation 1 testing shows stable microsatellite status and 1 mutation/ Mb, TP53 mutations x2  (a) PD-L1 dated July 2019 negative   PLAN: Brooke [name] is now just about a year out from definitive surgery for breast cancer with no evidence of disease recurrence.  This is very  favorable.  She is tolerating tamoxifen moderately well.  Hot flashes are a significant problem.  I offered her low-dose venlafaxine but at this point she demurs.  She is not exercising partly because she has knee problems but she does not have a plan to resolve the knee problem.  Her liver function tests are mildly elevated.  This could represent fatty liver or it could be due to some of the supplements she is taking.  I have asked her to stop everything that she does not absolutely need to take.  She can continue vitamin C and vitamin D if she wishes.  We will repeat lab work in approximately 2 months.  If they remain elevated we will proceed to formal liver evaluation.  She is already scheduled to see Dr. Marlou Starks in November.  She will return to see me in January.  She knows to call for any other issues that may develop before the next visit here.   Magrinat, Virgie Dad, MD  09/12/18 6:17 PM Medical Oncology and Hematology [facility] [address] [place], [place] [address] Tel. [phone]    Fax. [phone]   I, Wilburn Mylar, am acting as scribe for Dr. Virgie Dad. Magrinat.  I, Lurline Del MD, have reviewed the above documentation for accuracy and completeness, and I agree with the above.

## 2018-09-13 ENCOUNTER — Encounter: Payer: Medicare Other | Admitting: Physical Therapy

## 2018-09-13 ENCOUNTER — Ambulatory Visit: Payer: Medicare Other | Admitting: Physical Therapy

## 2018-09-13 ENCOUNTER — Telehealth: Payer: Self-pay | Admitting: *Deleted

## 2018-09-13 ENCOUNTER — Encounter: Payer: Self-pay | Admitting: Physical Therapy

## 2018-09-13 ENCOUNTER — Telehealth: Payer: Self-pay | Admitting: Oncology

## 2018-09-13 DIAGNOSIS — Z483 Aftercare following surgery for neoplasm: Secondary | ICD-10-CM

## 2018-09-13 DIAGNOSIS — R293 Abnormal posture: Secondary | ICD-10-CM | POA: Diagnosis not present

## 2018-09-13 DIAGNOSIS — M25612 Stiffness of left shoulder, not elsewhere classified: Secondary | ICD-10-CM

## 2018-09-13 DIAGNOSIS — L599 Disorder of the skin and subcutaneous tissue related to radiation, unspecified: Secondary | ICD-10-CM

## 2018-09-13 NOTE — Telephone Encounter (Signed)
-----   Message from [name], DO sent at 09/13/2018 10:46 AM EDT -----   ----- Message ----- From: Chauncey Cruel, MD Sent: 09/12/2018   6:22 PM EDT To: [name], MD, [name], DO

## 2018-09-13 NOTE — Telephone Encounter (Signed)
I left a message regarding schedule  

## 2018-09-13 NOTE — Progress Notes (Signed)
Please schedule video or phone follow up to address BP - very high at recent onc visit.

## 2018-09-13 NOTE — Therapy (Signed)
[place], [place], [address] Phone: [phone]   Fax:  [phone]  Physical Therapy Treatment  Patient Details  Name: Brooke [name] MRN: [id number] Date of Birth: 01-Mar-1950 Referring Provider (PT): Dr. Autumn Messing   Encounter Date: 09/13/2018  PT End of Session - 09/13/18 1525    Visit Number  12    Number of Visits  17    Date for PT Re-Evaluation  09/21/18    PT Start Time  1430    PT Stop Time  1515    PT Time Calculation (min)  45 min    Activity Tolerance  Patient tolerated treatment well    Behavior During Therapy  [facility] for tasks assessed/performed       Past Medical History:  Diagnosis Date  . Anemia   . Anxiety   . Arthritis   . Bilateral dry eyes   . Breast cancer, left breast ([place])   . Family history of breast cancer 02/02/2017  . Family history of breast cancer   . Family history of colon cancer 02/02/2017  . Family history of colon cancer   . Family history of prostate cancer   . Family history of prostate cancer in father 02/02/2017  . Family history of uterine cancer   . Fibroid   . History of kidney stones   . Hyperlipidemia   . Kidney stones   . Melanoma of upper arm ([place]) 2009   RIGHT ARM   . Obesity (BMI 30.0-34.9)   . Osteopenia 09/2017   T score -1.9 max 8% / 0.8% statistically significant decline at right and left hip stable at the spine  . PONV (postoperative nausea and vomiting)   . Squamous carcinoma     of the skin  . UTI (urinary tract infection)   . Varicose veins of both lower extremities   . Vitamin D deficiency 07/2009   LOW VITAMIN D 29  . Vitamin deficiency 07/2008   VITAMIN D LOW 23    Past Surgical History:  Procedure Laterality Date  . ABDOMINAL SURGERY  1975   ovairan cyst   . APPENDECTOMY    . BREAST LUMPECTOMY WITH RADIOACTIVE SEED LOCALIZATION Right 09/25/2017   Procedure: RIGHT BREAST LUMPECTOMY WITH RADIOACTIVE SEED LOCALIZATION;   Surgeon: Jovita Kussmaul, MD;  Location: [facility];  Service: General;  Laterality: Right;  . BREAST SURGERY     bio  . CHOLECYSTECTOMY    . DERMOID CYST  EXCISION    . DILATION AND CURETTAGE OF UTERUS  1998  . GALLBLADDER SURGERY    . HYSTEROSCOPY    . insertion  port a cath  03/02/2016  . MASTECTOMY MODIFIED RADICAL Left 09/25/2017   Procedure: LEFT MODIFIED RADICAL MASTECTOMY;  Surgeon: Jovita Kussmaul, MD;  Location: [facility];  Service: General;  Laterality: Left;  . PORTACATH PLACEMENT N/A 03/02/2017   Procedure: INSERTION PORT-A-CATH;  Surgeon: Jovita Kussmaul, MD;  Location: [facility];  Service: General;  Laterality: N/A;  . SKIN BIOPSY    . SKIN CANCER EXCISION  2010,2011   2010-RIGHT ARM, 2011-LEFT LOWER LEG.-DR Rolm Bookbinder DERM. DR. Sarajane Jews IS HER DERM SURGEON.    There were no vitals filed for this visit.  Subjective Assessment - 09/13/18 1442    Subjective  Pt says that her shoulder is doing better. She got her garment in the mail, but hasn't tried it on yet .She saw Dr. [name] yesterday and will keep following up with  him about her liver She admits she has not been doing her exercises at home    Pertinent History  Neoadjuvant chemotherapy early 2019; left mastectomy and ALND (9/13 nodes positive) on 09/25/17. Pt also had 33 treatments of radiation and previous PT .  She does not have a compression bra . She has a compression sleeve but she has not worn it.  She is limited in walking by painful knees     Patient Stated Goals  to get rid of the cording in her arm and learn an exercise program so she can go to the gym when they reopen  6/23 update she feels like her cording has gotten better down her lower arm,but she wantst to get rid of the binding down in her her axilla  09/13/2018 update. Pt says that she still has some cording in her arm.    Currently in Pain?  No/denies            LYMPHEDEMA/ONCOLOGY QUESTIONNAIRE - 09/13/18 1450      Left Upper Extremity Lymphedema   15 cm  Proximal to Olecranon Process  35 cm    10 cm Proximal to Olecranon Process  33 cm    Olecranon Process  30 cm    15 cm Proximal to Ulnar Styloid Process  27.8 cm    10 cm Proximal to Ulnar Styloid Process  24.7 cm    Just Proximal to Ulnar Styloid Process  17 cm    Across Hand at [employer]  19.5 cm    At [place] of 2nd Digit  6.5 cm                [facility] Adult PT Treatment/Exercise - 09/13/18 0001      Exercises   Exercises  Shoulder   instructed pt she could do the scapular series in sitting     Manual Therapy   Edema Management  remeasured arm circumference . Asked pt to bring in her garment next time y    Soft tissue mobilization  with pt in right sidelying and thick massage cream, soft tissue work to tight area at left latissimus and pec major muscle as well as posterior and upper shoulder and neck                   PT Long Term Goals - 08/21/18 1015      PT LONG TERM GOAL #1   Title  Pt will have 150 degrees of painfree shoulder abduction so that she can perform her ADLS easier     Baseline  125 degrees with c/o pain from cording at eval , on 6/23 pt has 135 degrees with pulling in her her back    Time  4    Period  Weeks    Status  On-going      PT LONG TERM GOAL #2   Title  Pt will be independent in home exercise program and progression for shoulder strength     Time  4    Status  On-going      PT LONG TERM GOAL #3   Title  Pt will reports tenderness to palpation in anterior and posterior shoulder is decreased by 50 %     Baseline  pt says the pan and tenderness is better, but tightness is still there    Status  Achieved      PT LONG TERM GOAL #4   Title  She will report tight in front of  chest at [place] major muscle is decreased by 50%    Time  4    Period  Weeks    Status  New      PT LONG TERM GOAL #5   Title  Patient will verbalize good understanding of lymphedema risk reduction practices and be able to manage her left arm swelling with  self MLD, compression and exercise 4    Period  Weeks    Status  New            Plan - 09/13/18 1526    Clinical Impression Statement  Pt is doingg better though she still has tightness in her left axilla. posterior shoulder and anterior chest.  Her arm circumferences have decreased.  She will be ready to discharge after next session    Rehab Potential  Good    PT Frequency  2x / week    PT Duration  4 weeks    PT Treatment/Interventions  ADLs/Self Care Home Management;Therapeutic exercise;DME Instruction;Patient/family education;Manual techniques;Passive range of motion;Scar mobilization;Manual lymph drainage    PT Next Visit Plan  review exercises for home, check compression garment. soft tissue work as needed assess goals for discharge       Patient will benefit from skilled therapeutic intervention in order to improve the following deficits and impairments:  Decreased skin integrity, Decreased scar mobility, Decreased knowledge of precautions, Decreased knowledge of use of DME, Increased fascial restricitons, Impaired UE functional use, Pain, Decreased range of motion, Postural dysfunction  Visit Diagnosis: 1. Abnormal posture   2. Aftercare following surgery for neoplasm   3. Stiffness of left shoulder, not elsewhere classified   4. Disorder of the skin and subcutaneous tissue related to radiation, unspecified        Problem List Patient Active Problem List   Diagnosis Date Noted  . Genetic testing 12/14/2017  . Family history of uterine cancer   . Family history of prostate cancer   . Melanoma in situ of right upper extremity ([place]) 07/18/2017  . Port-A-Cath in place 04/18/2017  . Drug-induced neutropenia ([place]) 03/09/2017  . Family history of breast cancer 02/02/2017  . Family history of colon cancer 02/02/2017  . Family history of prostate cancer in father 02/02/2017  . Malignant neoplasm of overlapping sites of left breast in female, estrogen receptor positive  ([place]) 01/31/2017  . Hx of hot flashes, menopausal, HRT in the past 12/19/2011  . Osteopenia, stable, DEXA 2013, recs to repeat in 2018 12/19/2011  . Hx of Hyperlipidemia, LDL 144 in 07/2011 12/19/2011  . Vitamin D insufficiency 12/19/2011  . Fibroid    Donato Heinz. Owens Shark PT  [place] Levo 09/13/2018, 3:29 PM  [place], [place], [address] Phone: [phone]   Fax:  [phone]  Name: ANGELLA MONTAS MRN: [id number] Date of Birth: [birth year]

## 2018-09-13 NOTE — Telephone Encounter (Signed)
I left a detailed message at the pts cell number to call back for an appt as below.

## 2018-09-14 ENCOUNTER — Telehealth: Payer: Self-pay | Admitting: Oncology

## 2018-09-14 NOTE — Telephone Encounter (Signed)
Patient called to reschedule January appointment

## 2018-09-14 NOTE — Telephone Encounter (Signed)
I left a detailed message at the pts cell number to call back for an appt as below.

## 2018-09-19 ENCOUNTER — Other Ambulatory Visit: Payer: Self-pay

## 2018-09-19 ENCOUNTER — Encounter: Payer: Self-pay | Admitting: Family Medicine

## 2018-09-19 ENCOUNTER — Ambulatory Visit (INDEPENDENT_AMBULATORY_CARE_PROVIDER_SITE_OTHER): Payer: Medicare Other | Admitting: Family Medicine

## 2018-09-19 DIAGNOSIS — R030 Elevated blood-pressure reading, without diagnosis of hypertension: Secondary | ICD-10-CM | POA: Diagnosis not present

## 2018-09-19 DIAGNOSIS — R748 Abnormal levels of other serum enzymes: Secondary | ICD-10-CM | POA: Diagnosis not present

## 2018-09-19 NOTE — Progress Notes (Signed)
Virtual Visit via Telephone Note  I connected with Brooke [name]  on 09/19/18 at  9:30 AM EDT by telephone and verified that I am speaking with the correct person using two identifiers.   I discussed the limitations, risks, security and privacy concerns of performing an evaluation and management service by telephone and the availability of in person appointments. I also discussed with the patient that there may be a patient responsible charge related to this service. The patient expressed understanding and agreed to proceed.  Location patient: home Location provider: work office Participants present for the call: patient, provider Patient did not have a visit in the prior 7 days to address this/these issue(s).   History of Present Illness: Patient asked to schedule visit to address bp which was elevated at recent oncology visit (161/102). Also was elevated in 03/2018 visit 162/91; gyn visit 02/2018 was 154/82. Doesn't have home cuff for checking. Hasn't been on treatment for bp in past.  Of note: she did have bloodwork on 7/15 and liver enzymes were slightly elevated, glucose slightly elevated. A1C on 6/15 was 5.7. She did discuss results with Dr. [name] and he recommended holding the vitamins that she was taking (hair, skin, nail, vitamin E) and she will have liver enzymes rechecked when she returns for follow up with them. Doesn't drink alcohol.   Does ask about elevated ANA from June. States that she always has some soreness of joints. Can't walk like she used to. Doesn't take anything for pain. Does have advil. But rarely will take this. Avoiding tylenol with recent liver enzyme abnormalities. Felt like arthritis was worse in spring. Worried in spring about the pain worsening. Seems to have improved from spring to now.  Has been working on weight loss and has lost 10 pounds in last couple of months.    Observations/Objective: Patient sounds cheerful and well on the phone. I do not  appreciate any SOB. Speech and thought processing are grossly intact. Patient reported vitals:  Assessment and Plan: 1. Elevated liver enzymes Will recheck levels in a month. She is avoiding supplements and tylenol in meanwhile. - Hepatic function panel; Future  2. Elevated blood pressure reading She is going to get home cuff and check. She will report back these numbers to [place] in 1 weeks time. We will set up follow up/treatment plan pending this.    Follow Up Instructions: Return pending bp report in 1 week.    [address] 21-30 I did not refer this patient for an OV in the next 24 hours for this/these issue(s).  I discussed the assessment and treatment plan with the patient. The patient was provided an opportunity to ask questions and all were answered. The patient agreed with the plan and demonstrated an understanding of the instructions.   The patient was advised to call back or seek an in-person evaluation if the symptoms worsen or if the condition fails to improve as anticipated.  I provided 32 minutes of non-face-to-face time during this encounter.   Micheline Rough, MD

## 2018-09-20 ENCOUNTER — Encounter: Payer: Self-pay | Admitting: Physical Therapy

## 2018-09-20 ENCOUNTER — Ambulatory Visit: Payer: Medicare Other | Admitting: Physical Therapy

## 2018-09-20 ENCOUNTER — Other Ambulatory Visit: Payer: Self-pay

## 2018-09-20 DIAGNOSIS — Z483 Aftercare following surgery for neoplasm: Secondary | ICD-10-CM

## 2018-09-20 DIAGNOSIS — R293 Abnormal posture: Secondary | ICD-10-CM

## 2018-09-20 DIAGNOSIS — L599 Disorder of the skin and subcutaneous tissue related to radiation, unspecified: Secondary | ICD-10-CM

## 2018-09-20 DIAGNOSIS — M25612 Stiffness of left shoulder, not elsewhere classified: Secondary | ICD-10-CM

## 2018-09-20 NOTE — Therapy (Signed)
[place], [place], [address] Phone: [phone]   Fax:  [phone]  Physical Therapy Treatment  Patient Details  Name: Brooke [name] MRN: [id number] Date of Birth: 06/10/1950 Referring Provider (PT): Dr. Autumn Messing   Encounter Date: 09/20/2018  PT End of Session - 09/20/18 1644    Visit Number  13    Number of Visits  17    Date for PT Re-Evaluation  09/21/18    PT Start Time  1430    PT Stop Time  1520    PT Time Calculation (min)  50 min    Activity Tolerance  Patient tolerated treatment well    Behavior During Therapy  [facility] for tasks assessed/performed       Past Medical History:  Diagnosis Date  . Anemia   . Anxiety   . Arthritis   . Bilateral dry eyes   . Breast cancer, left breast ([place])   . Family history of breast cancer 02/02/2017  . Family history of breast cancer   . Family history of colon cancer 02/02/2017  . Family history of colon cancer   . Family history of prostate cancer   . Family history of prostate cancer in father 02/02/2017  . Family history of uterine cancer   . Fibroid   . History of kidney stones   . Hyperlipidemia   . Kidney stones   . Melanoma of upper arm ([place]) 2009   RIGHT ARM   . Obesity (BMI 30.0-34.9)   . Osteopenia 09/2017   T score -1.9 max 8% / 0.8% statistically significant decline at right and left hip stable at the spine  . PONV (postoperative nausea and vomiting)   . Squamous carcinoma     of the skin  . UTI (urinary tract infection)   . Varicose veins of both lower extremities   . Vitamin D deficiency 07/2009   LOW VITAMIN D 29  . Vitamin deficiency 07/2008   VITAMIN D LOW 23    Past Surgical History:  Procedure Laterality Date  . ABDOMINAL SURGERY  1975   ovairan cyst   . APPENDECTOMY    . BREAST LUMPECTOMY WITH RADIOACTIVE SEED LOCALIZATION Right 09/25/2017   Procedure: RIGHT BREAST LUMPECTOMY WITH RADIOACTIVE SEED LOCALIZATION;   Surgeon: Jovita Kussmaul, MD;  Location: [facility];  Service: General;  Laterality: Right;  . BREAST SURGERY     bio  . CHOLECYSTECTOMY    . DERMOID CYST  EXCISION    . DILATION AND CURETTAGE OF UTERUS  1998  . GALLBLADDER SURGERY    . HYSTEROSCOPY    . insertion  port a cath  03/02/2016  . MASTECTOMY MODIFIED RADICAL Left 09/25/2017   Procedure: LEFT MODIFIED RADICAL MASTECTOMY;  Surgeon: Jovita Kussmaul, MD;  Location: [facility];  Service: General;  Laterality: Left;  . PORTACATH PLACEMENT N/A 03/02/2017   Procedure: INSERTION PORT-A-CATH;  Surgeon: Jovita Kussmaul, MD;  Location: [facility];  Service: General;  Laterality: N/A;  . SKIN BIOPSY    . SKIN CANCER EXCISION  2010,2011   2010-RIGHT ARM, 2011-LEFT LOWER LEG.-DR Rolm Bookbinder DERM. DR. Sarajane Jews IS HER DERM SURGEON.    There were no vitals filed for this visit.  Subjective Assessment - 09/20/18 1636    Subjective  "my chest got tight again"  Pt states she hasn't been doing her exercises. Pt brings in her [employer] Ready Wrap to try on and all her execise sheets to review She  acknowledges this will be her last treatment    Pertinent History  Neoadjuvant chemotherapy early 2019; left mastectomy and ALND (9/13 nodes positive) on 09/25/17. Pt also had 33 treatments of radiation and previous PT .  She does not have a compression bra . She has a compression sleeve but she has not worn it.  She is limited in walking by painful knees     Patient Stated Goals  to get rid of the cording in her arm and learn an exercise program so she can go to the gym when they reopen  6/23 update she feels like her cording has gotten better down her lower arm,but she wantst to get rid of the binding down in her her axilla  09/13/2018 update. Pt says that she still has some cording in her arm.09/19/2018 pt feels she is ready to discharge    Currently in Pain?  Yes    Pain Score  --   did not rate   Pain Location  Chest    Pain Orientation  Left    Pain Descriptors / Indicators   Tightness    Pain Type  Chronic pain    Pain Onset  More than a month ago    Aggravating Factors   reaching    Pain Relieving Factors  exercise                       [facility] Adult PT Treatment/Exercise - 09/20/18 0001      Self-Care   Other Self-Care Comments   instruction in and application of Ready Wrap to left arm with verbal cues and intemittent hand over hand instruction.  Gave pt a small patch of thin foam to wear at full area at medial elbow.  Pt knows to wear this when she feels her arm is getting fuller, but doesn't have to wear it all the time.       Exercises   Other Exercises   Reviewed all home exercise sheets and reinforced dowel exercise stretching, scapular series and isometric strengthening.  She wanted to try the recumbant bike and was shown the nustep as options for her to use when she can get back into the gym              PT Education - 09/20/18 1644    Education Details  Application of Ready Wrap for lymphedema managment    Person(s) Educated  Patient    Methods  Explanation;Demonstration    Comprehension  Verbalized understanding;Returned demonstration;Verbal cues required          PT Long Term Goals - 09/20/18 1645      PT LONG TERM GOAL #1   Title  Pt will have 150 degrees of painfree shoulder abduction so that she can perform her ADLS easier     Baseline  125 degrees with c/o pain from cording at eval , on 6/23 pt has 135 degrees with pulling in her her back    Status  Partially Met      PT LONG TERM GOAL #2   Title  Pt will be independent in home exercise program and progression for shoulder strength     Time  4    Period  Weeks    Status  Achieved      PT LONG TERM GOAL #3   Title  Pt will reports tenderness to palpation in anterior and posterior shoulder is decreased by 50 %     Status  Achieved  PT LONG TERM GOAL #4   Title  She will report tight in front of chest at [place] major muscle is decreased by 50%    Status   Achieved      PT LONG TERM GOAL #5   Title  Patient will verbalize good understanding of lymphedema risk reduction practices and be able to manage her left arm swelling with self MLD, compression and exercise 4    Status  Achieved            Plan - 09/20/18 1647    Clinical Impression Statement  Pt reports her shoulder is "so much better" and she is pleased with that. she still has tightness in her anterior chest and reports she will do her home exercise to improve that.  Her arm lymphedema is improved and she now has a velcro compression garment to help control that. She is ready for discharge.    Clinical Impairments Affecting Rehab Potential  extensive radiation ending 12/21/17    PT Treatment/Interventions  ADLs/Self Care Home Management;Therapeutic exercise;DME Instruction;Patient/family education;Manual techniques;Passive range of motion;Scar mobilization;Manual lymph drainage    PT Next Visit Plan  Discharge    PT Home Exercise Plan  dowel stretches for shoulder , wall washes for stretching, isometric shoulder exercises, supine scapular series       Patient will benefit from skilled therapeutic intervention in order to improve the following deficits and impairments:  Decreased skin integrity, Decreased scar mobility, Decreased knowledge of precautions, Decreased knowledge of use of DME, Increased fascial restricitons, Impaired UE functional use, Pain, Decreased range of motion, Postural dysfunction  Visit Diagnosis: No diagnosis found.     Problem List Patient Active Problem List   Diagnosis Date Noted  . Genetic testing 12/14/2017  . Family history of uterine cancer   . Family history of prostate cancer   . Melanoma in situ of right upper extremity ([place]) 07/18/2017  . Port-A-Cath in place 04/18/2017  . Drug-induced neutropenia ([place]) 03/09/2017  . Family history of breast cancer 02/02/2017  . Family history of colon cancer 02/02/2017  . Family history of prostate  cancer in father 02/02/2017  . Malignant neoplasm of overlapping sites of left breast in female, estrogen receptor positive ([place]) 01/31/2017  . Hx of hot flashes, menopausal, HRT in the past 12/19/2011  . Osteopenia, stable, DEXA 2013, recs to repeat in 2018 12/19/2011  . Hx of Hyperlipidemia, LDL 144 in 07/2011 12/19/2011  . Vitamin D insufficiency 12/19/2011  . Fibroid     PHYSICAL THERAPY DISCHARGE SUMMARY  Visits from Start of Care: 13  Current functional level related to goals / functional outcomes: Pr feels that she can manage at home    Remaining deficits: Tightness in left anterior chest and axilla   Education / Equipment: Home exercise, lymphedema management  Plan: Patient agrees to discharge.  Patient goals were partially met. Patient is being discharged due to being pleased with the current functional level.  ?????    Donato Heinz. Owens Shark PT  [place] [facility]o 09/20/2018, 4:49 PM  [place], [place], [address] Phone: [phone]   Fax:  [phone]  Name: ZYKERIA LAGUARDIA MRN: [id number] Date of Birth: Jul 19, 1950

## 2018-09-21 ENCOUNTER — Telehealth: Payer: Self-pay | Admitting: *Deleted

## 2018-09-21 NOTE — Telephone Encounter (Signed)
-----   Message from [name], MD sent at 09/19/2018 10:16 AM EDT ----- Please set up bloodwork appointment around 8/15. Please call her on Friday to set this up, make sure she got cuff for checking at home. She will need to give [place] numbers for bp in 1 weeks time and we will set up follow up visit pending this.

## 2018-09-21 NOTE — Telephone Encounter (Signed)
Lab appt scheduled for 8/17.  Patient stated she bought a cuff, will begin checking her blood pressure at home tomorrow and call back in 1 week with the numbers.

## 2018-10-15 ENCOUNTER — Other Ambulatory Visit: Payer: Self-pay

## 2018-10-15 ENCOUNTER — Other Ambulatory Visit (INDEPENDENT_AMBULATORY_CARE_PROVIDER_SITE_OTHER): Payer: Medicare Other

## 2018-10-15 DIAGNOSIS — R748 Abnormal levels of other serum enzymes: Secondary | ICD-10-CM | POA: Diagnosis not present

## 2018-10-15 LAB — HEPATIC FUNCTION PANEL
ALT: 43 U/L — ABNORMAL HIGH (ref 0–35)
AST: 22 U/L (ref 0–37)
Albumin: 4.3 g/dL (ref 3.5–5.2)
Alkaline Phosphatase: 65 U/L (ref 39–117)
Bilirubin, Direct: 0.1 mg/dL (ref 0.0–0.3)
Total Bilirubin: 0.5 mg/dL (ref 0.2–1.2)
Total Protein: 6.6 g/dL (ref 6.0–8.3)

## 2018-10-17 ENCOUNTER — Telehealth: Payer: Self-pay

## 2018-10-17 NOTE — Telephone Encounter (Signed)
Pt. Called back, given results. Requests a copy be mailed to her.

## 2018-10-26 ENCOUNTER — Telehealth: Payer: Self-pay | Admitting: Family Medicine

## 2018-10-26 NOTE — Telephone Encounter (Signed)
I selected norvasc because I think it is generally easy to tolerate and doesn't cause as much fluctuation in pressures with changes in position (so less symptoms than some of other categories). Also doesn't affect electrolytes, kidneys which is nice. If she is concerned with swelling (but I don't usually see at the low dose I was suggesting) we could do losartan or similar med. That is my other favorite category but we have seen more recalls with that category of medications this year so I was trying to avoid this.

## 2018-10-26 NOTE — Telephone Encounter (Signed)
Patient informed of the message below.  Prior to the medication being called in, patient wanted to know why Norvasc was the medication recommended for her to take? Also wanted to let Dr Ethlyn Gallery know she has problems with varicose veins since swelling in the legs are a side effect.  Message sent to Dr Ethlyn Gallery.

## 2018-10-26 NOTE — Telephone Encounter (Signed)
I received blood pressure readings that she dropped off. All of them are higher than desired. I would suggest that we add medication to help with blood pressure and plan for virtual follow up in about 3 weeks time with home monitoring in meanwhile.   Watching processed food/salt intake is important as this can affect blood pressure. If pressures improve we can always stop medication.   I don't believe she has taken anything for pressures in past. I think starting with something low dose is best. I would suggest norvasc 2.5mg  at bedtime. She can start with just breaking in half (1.25mg ) and we can increase as needed after follow up visit. Biggest side effect is swelling in legs with this but I would not expect this with such low dose.   If ok with this plan; please send in 30 tab w 2 refills and schedule follow up. Let me know if any concerns. Keep checking home pressures in meanwhile.

## 2018-10-29 NOTE — Telephone Encounter (Signed)
Left a message for the pt to return my call.  CRM also created.  

## 2018-10-29 NOTE — Telephone Encounter (Signed)
Copied from [place] [phone]. Topic: [employer] and exercise psychologist Patient (Clinic Use ONLY) >> Oct 29, 2018 11:41 AM Agnes Lawrence, CMA wrote: Reason for CRM: see note from PCP regarding meds >> Oct 29, 2018  4:26 PM Mcneil, Ja-Kwan wrote: Pt returned call to office. Pt requests call back.

## 2018-10-30 ENCOUNTER — Telehealth: Payer: Self-pay | Admitting: *Deleted

## 2018-10-30 ENCOUNTER — Other Ambulatory Visit: Payer: Self-pay | Admitting: Family Medicine

## 2018-10-30 NOTE — Telephone Encounter (Signed)
Copied from [place] [phone]. Topic: Quick [employer] and exercise psychologist Patient (Clinic Use ONLY) >> Oct 29, 2018 11:41 AM Agnes Lawrence, CMA wrote: Reason for CRM: see note from PCP regarding meds >> Oct 29, 2018  4:26 PM Mcneil, Ja-Kwan wrote: Pt returned call to office. Pt requests call back.

## 2018-10-30 NOTE — Telephone Encounter (Signed)
Patient informed of the message below.  Stated she has swelling in her legs and wanted to know if Norvasc is compatible with Tamoxifen?  Message sent to Dr Ethlyn Gallery.

## 2018-10-30 NOTE — Telephone Encounter (Signed)
pls call [phone]. Pls fu with pt still has not gotten in touch with anyone, apologizes for phone tag, call tomorrow and leave message if she can not get to the phone

## 2018-10-30 NOTE — Telephone Encounter (Signed)
See phone note

## 2018-10-30 NOTE — Telephone Encounter (Signed)
Ok. Let's do losartan 25mg  daily then. That way she doesn't have to worry about it contributing to swelling. There is no interaction with tamoxifen and blood pressure medications that we are discussing. She can take this in evening and if she wants to start with 1/2 tab she can. That way the improvement will be very gradual and she should not notice symptoms like light headedness/dizziness that we can see when we have larger blood pressure drops.  This will also be good timing for her to start and track before our followup appointment in September. It usually takes about 2 weeks for medication to fully kick in although she will notice some improvement right away.   Happy to call her if she has further questions. Let me know.

## 2018-10-30 NOTE — Telephone Encounter (Signed)
Left a message for the pt to return my call.  CRM also created.  

## 2018-10-31 ENCOUNTER — Other Ambulatory Visit: Payer: Self-pay

## 2018-10-31 ENCOUNTER — Other Ambulatory Visit: Payer: Self-pay | Admitting: Family Medicine

## 2018-10-31 ENCOUNTER — Inpatient Hospital Stay: Payer: Medicare Other | Attending: Oncology

## 2018-10-31 DIAGNOSIS — Z923 Personal history of irradiation: Secondary | ICD-10-CM | POA: Diagnosis not present

## 2018-10-31 DIAGNOSIS — Z8049 Family history of malignant neoplasm of other genital organs: Secondary | ICD-10-CM | POA: Insufficient documentation

## 2018-10-31 DIAGNOSIS — Z85828 Personal history of other malignant neoplasm of skin: Secondary | ICD-10-CM | POA: Diagnosis not present

## 2018-10-31 DIAGNOSIS — Z170 Estrogen receptor positive status [ER+]: Secondary | ICD-10-CM | POA: Diagnosis not present

## 2018-10-31 DIAGNOSIS — F419 Anxiety disorder, unspecified: Secondary | ICD-10-CM | POA: Insufficient documentation

## 2018-10-31 DIAGNOSIS — D0361 Melanoma in situ of right upper limb, including shoulder: Secondary | ICD-10-CM

## 2018-10-31 DIAGNOSIS — Z802 Family history of malignant neoplasm of other respiratory and intrathoracic organs: Secondary | ICD-10-CM | POA: Insufficient documentation

## 2018-10-31 DIAGNOSIS — Z79810 Long term (current) use of selective estrogen receptor modulators (SERMs): Secondary | ICD-10-CM | POA: Insufficient documentation

## 2018-10-31 DIAGNOSIS — Z79899 Other long term (current) drug therapy: Secondary | ICD-10-CM | POA: Diagnosis not present

## 2018-10-31 DIAGNOSIS — Z803 Family history of malignant neoplasm of breast: Secondary | ICD-10-CM | POA: Insufficient documentation

## 2018-10-31 DIAGNOSIS — C50812 Malignant neoplasm of overlapping sites of left female breast: Secondary | ICD-10-CM

## 2018-10-31 DIAGNOSIS — Z800 Family history of malignant neoplasm of digestive organs: Secondary | ICD-10-CM | POA: Diagnosis not present

## 2018-10-31 DIAGNOSIS — C50212 Malignant neoplasm of upper-inner quadrant of left female breast: Secondary | ICD-10-CM | POA: Insufficient documentation

## 2018-10-31 LAB — CBC WITH DIFFERENTIAL/PLATELET
Abs Immature Granulocytes: 0.01 10*3/uL (ref 0.00–0.07)
Basophils Absolute: 0 10*3/uL (ref 0.0–0.1)
Basophils Relative: 1 %
Eosinophils Absolute: 0.1 10*3/uL (ref 0.0–0.5)
Eosinophils Relative: 3 %
HCT: 41.3 % (ref 36.0–46.0)
Hemoglobin: 13.5 g/dL (ref 12.0–15.0)
Immature Granulocytes: 0 %
Lymphocytes Relative: 17 %
Lymphs Abs: 0.8 10*3/uL (ref 0.7–4.0)
MCH: 29.8 pg (ref 26.0–34.0)
MCHC: 32.7 g/dL (ref 30.0–36.0)
MCV: 91.2 fL (ref 80.0–100.0)
Monocytes Absolute: 0.5 10*3/uL (ref 0.1–1.0)
Monocytes Relative: 11 %
Neutro Abs: 3.2 10*3/uL (ref 1.7–7.7)
Neutrophils Relative %: 68 %
Platelets: 238 10*3/uL (ref 150–400)
RBC: 4.53 MIL/uL (ref 3.87–5.11)
RDW: 13.2 % (ref 11.5–15.5)
WBC: 4.6 10*3/uL (ref 4.0–10.5)
nRBC: 0 % (ref 0.0–0.2)

## 2018-10-31 LAB — COMPREHENSIVE METABOLIC PANEL
ALT: 32 U/L (ref 0–44)
AST: 18 U/L (ref 15–41)
Albumin: 4.2 g/dL (ref 3.5–5.0)
Alkaline Phosphatase: 70 U/L (ref 38–126)
Anion gap: 11 (ref 5–15)
BUN: 18 mg/dL (ref 8–23)
CO2: 24 mmol/L (ref 22–32)
Calcium: 9.3 mg/dL (ref 8.9–10.3)
Chloride: 105 mmol/L (ref 98–111)
Creatinine, Ser: 0.87 mg/dL (ref 0.44–1.00)
GFR calc Af Amer: >60 mL/min (ref >60)
GFR calc non Af Amer: >60 mL/min (ref >60)
Glucose, Bld: 112 mg/dL — ABNORMAL HIGH (ref 70–99)
Potassium: 4.3 mmol/L (ref 3.5–5.1)
Sodium: 140 mmol/L (ref 135–145)
Total Bilirubin: 0.6 mg/dL (ref 0.3–1.2)
Total Protein: 7.2 g/dL (ref 6.5–8.1)

## 2018-10-31 MED ORDER — LOSARTAN POTASSIUM 25 MG PO TABS: 25.0000 mg | ORAL_TABLET | Freq: Every day | ORAL | 1 refills | Status: DC

## 2018-10-31 NOTE — Telephone Encounter (Signed)
Called the pt and informed her of the message below.  Patient is confused and message forwarded to Dr Ethlyn Gallery to call the pt.

## 2018-10-31 NOTE — Telephone Encounter (Signed)
I spoke with patient about blood pressure control options.  We discussed side effects of both amlodipine and losartan.  She is agreeable to starting losartan 25 mg.  We discussed starting with a half a tablet until she follows up.  She is interested in doing whatever she can lifestyle wise to help with blood pressure so she does not have to take medication.  We discussed low-sodium diet, weight loss, and stress reduction. She is going to check with pharmacy to see if she can get medication manufactured from a [place] company.  She did not with her tamoxifen and feels reassured by having medications that are manufactured in the [place].  JoAnne - please schedule her for physical in 3 weeks time. This will give chance to touch base on blood pressure. In office.

## 2018-11-01 ENCOUNTER — Telehealth: Payer: Self-pay | Admitting: *Deleted

## 2018-11-01 ENCOUNTER — Telehealth: Payer: Self-pay

## 2018-11-01 NOTE — Telephone Encounter (Signed)
See prior note

## 2018-11-01 NOTE — Telephone Encounter (Signed)
Patient is aware of Dr Berenice Bouton recommendations.

## 2018-11-01 NOTE — Telephone Encounter (Signed)
Copied from [place] [phone]. Topic: General - Other >> Oct 31, 2018  1:37 PM Rainey Pines A wrote: Patient returning [place] call and would like a callback

## 2018-11-01 NOTE — Telephone Encounter (Signed)
Glad she was able to get [place] manufacturer! I advised her to cut in half to start just to minimize potential side effects (like light headedness). Since she has visit in a few weeks she can either stay on this half dose until then or if pressures are still over [id number] systolic most of time after a couple of weeks she can increase to full tablet.

## 2018-11-01 NOTE — Telephone Encounter (Signed)
Called the pt and informed her I changed the reason for the visit already scheduled on 9/23 to be her physical and to follow up on the blood pressure also.  Also informed pt per Dr Ethlyn Gallery to take 1/2 of the Losartan 25mg  until her follow up, recommended she use a pill cutter due to the size and she agreed.

## 2018-11-01 NOTE — Telephone Encounter (Signed)
Copied from [place] [phone]. Topic: General - Inquiry >> Nov 01, 2018  9:01 AM Richardo Priest, NT wrote: Reason for CRM: Patient called in stating that she was able to use a [place] manufacture. The one used was [employer], retail. Patient would like to also know if she should be cutting pills in half or take the entire pill. Please advise.

## 2018-11-07 ENCOUNTER — Telehealth: Payer: Self-pay | Admitting: *Deleted

## 2018-11-07 NOTE — Telephone Encounter (Signed)
This RN returned pt's call per her inquiry regarding recent labs- reviewed with noted normalizing of AST.  Of note Brooke [name] states she was advised by Dr [name] when to stop taking vitamin E, B and " hair,skin and nails " which has biotin as an ingredient.   She is asking if she should resume any of the above ?"  She also has started on 12.5 mg losartan per her primary MD.  Inquiry will be made with MD per resuming any of her held supplements.

## 2018-11-21 ENCOUNTER — Telehealth: Payer: Self-pay | Admitting: *Deleted

## 2018-11-21 ENCOUNTER — Ambulatory Visit: Payer: Medicare Other | Admitting: Family Medicine

## 2018-11-21 ENCOUNTER — Encounter: Payer: Self-pay | Admitting: Family Medicine

## 2018-11-21 ENCOUNTER — Other Ambulatory Visit: Payer: Self-pay

## 2018-11-21 VITALS — BP 122/80 | HR 90 | Temp 98.2°F | Ht 69.0 in | Wt 239.3 lb

## 2018-11-21 DIAGNOSIS — I10 Essential (primary) hypertension: Secondary | ICD-10-CM | POA: Diagnosis not present

## 2018-11-21 DIAGNOSIS — H6122 Impacted cerumen, left ear: Secondary | ICD-10-CM

## 2018-11-21 DIAGNOSIS — G8929 Other chronic pain: Secondary | ICD-10-CM

## 2018-11-21 DIAGNOSIS — Z0000 Encounter for general adult medical examination without abnormal findings: Secondary | ICD-10-CM

## 2018-11-21 DIAGNOSIS — K5900 Constipation, unspecified: Secondary | ICD-10-CM

## 2018-11-21 DIAGNOSIS — Z23 Encounter for immunization: Secondary | ICD-10-CM | POA: Diagnosis not present

## 2018-11-21 DIAGNOSIS — Z0001 Encounter for general adult medical examination with abnormal findings: Secondary | ICD-10-CM

## 2018-11-21 DIAGNOSIS — L309 Dermatitis, unspecified: Secondary | ICD-10-CM | POA: Diagnosis not present

## 2018-11-21 DIAGNOSIS — M25561 Pain in right knee: Secondary | ICD-10-CM | POA: Diagnosis not present

## 2018-11-21 DIAGNOSIS — M2550 Pain in unspecified joint: Secondary | ICD-10-CM

## 2018-11-21 DIAGNOSIS — M7061 Trochanteric bursitis, right hip: Secondary | ICD-10-CM

## 2018-11-21 DIAGNOSIS — H60543 Acute eczematoid otitis externa, bilateral: Secondary | ICD-10-CM

## 2018-11-21 NOTE — Patient Instructions (Addendum)
Tart cherry juice as anti-inflammatory  Glucosamine could be considered  Update me in 2 weeks time about knee pain, hip pain and stools.   Ok to take miralax for stools.  Hip Bursitis  Hip bursitis is inflammation of a fluid-filled sac (bursa) in the hip joint. The bursa prevents the bones in the hip joint from rubbing against each other. Hip bursitis can cause mild to moderate pain, and symptoms often come and go over time. What are the causes? This condition may be caused by:  Injury to the hip.  Overuse of the muscles that surround the hip joint.  Previous injury or surgery of the hip.  Arthritis or gout.  Diabetes.  Thyroid disease.  Infection. In some cases, the cause may not be known. What are the signs or symptoms? Symptoms of this condition include:  Mild or moderate pain in the hip area. Pain may get worse with movement.  Tenderness and swelling of the hip, especially on the outer side of the hip.  In rare cases, the bursa may become infected. This may cause a fever, as well as warmth and redness in the area. Symptoms may come and go. How is this diagnosed? This condition may be diagnosed based on:  A physical exam.  Your medical history.  X-rays.  Removal of fluid from your inflamed bursa for testing (biopsy). You may be sent to a health care provider who specializes in bone diseases (orthopedist) or a provider who specializes in joint inflammation (rheumatologist). How is this treated? This condition is treated by resting, icing, applying pressure (compression), and raising (elevating) the injured area. This is called RICE treatment. In some cases, this may be enough to make your symptoms go away. Treatment may also include:  Using crutches.  Draining fluid out of the bursa to help relieve swelling.  Injecting medicine that helps to reduce inflammation (cortisone).  Additional medicines if the bursa is infected. Follow these instructions at  home: Managing pain, stiffness, and swelling   If directed, put ice on the painful area. ? Put ice in a plastic bag. ? Place a towel between your skin and the bag. ? Leave the ice on for 20 minutes, 2-3 times a day. ? Raise (elevate) your hip above the level of your heart as much as you can without pain. To do this, try putting a pillow under your hips while you lie down. Activity  Return to your normal activities as told by your health care provider. Ask your health care provider what activities are safe for you.  Rest and protect your hip as much as possible until your pain and swelling get better. General instructions  Take over-the-counter and prescription medicines only as told by your health care provider.  Wear compression wraps only as told by your health care provider.  Do not use your hip to support your body weight until your health care provider says that you can. Use crutches as told by your health care provider.  Gently massage and stretch your injured area as often as is comfortable.  Keep all follow-up visits as told by your health care provider. This is important. How is this prevented?  Exercise regularly, as told by your health care provider.  Warm up and stretch before being active.  Cool down and stretch after being active.  If an activity irritates your hip or causes pain, avoid the activity as much as possible.  Avoid sitting down for long periods at a time. Contact a health care  provider if you:  Have a fever.  Develop new symptoms.  Have difficulty walking or doing everyday activities.  Have pain that gets worse or does not get better with medicine.  Develop red skin or a feeling of warmth in your hip area. Get help right away if you:  Cannot move your hip.  Have severe pain. Summary  Hip bursitis is inflammation of a fluid-filled sac (bursa) in the hip joint.  Hip bursitis can cause mild to moderate pain, and symptoms often come and go  over time.  This condition is treated with rest, ice, compression, elevation, and medicines. This information is not intended to replace advice given to you by your health care provider. Make sure you discuss any questions you have with your health care provider. Document Released: 08/06/2001 Document Revised: 10/23/2017 Document Reviewed: 10/23/2017 Elsevier Patient Education  2020 [place].  Hip Bursitis Rehab Ask your health care provider which exercises are safe for you. Do exercises exactly as told by your health care provider and adjust them as directed. It is normal to feel mild stretching, pulling, tightness, or discomfort as you do these exercises. Stop right away if you feel sudden pain or your pain gets worse. Do not begin these exercises until told by your health care provider. Stretching exercise This exercise warms up your muscles and joints and improves the movement and flexibility of your hip. This exercise also helps to relieve pain and stiffness. Iliotibial band stretch An iliotibial band is a strong band of muscle tissue that runs from the outer side of your hip to the outer side of your thigh and knee. 1. Lie on your side with your left / right leg in the top position. 2. Bend your left / right knee and grab your ankle. Stretch out your bottom arm to help you balance. 3. Slowly bring your knee back so your thigh is behind your body. 4. Slowly lower your knee toward the floor until you feel a gentle stretch on the outside of your left / right thigh. If you do not feel a stretch and your knee will not fall farther, place the heel of your other foot on top of your knee and pull your knee down toward the floor with your foot. 5. Hold this position for __________ seconds. 6. Slowly return to the starting position. Repeat __________ times. Complete this exercise __________ times a day. Strengthening exercises These exercises build strength and endurance in your hip and pelvis.  Endurance is the ability to use your muscles for a long time, even after they get tired. Bridge This exercise strengthens the muscles that move your thigh backward (hip extensors). 1. Lie on your back on a firm surface with your knees bent and your feet flat on the floor. 2. Tighten your buttocks muscles and lift your buttocks off the floor until your trunk is level with your thighs. ? Do not arch your back. ? You should feel the muscles working in your buttocks and the back of your thighs. If you do not feel these muscles, slide your feet 1-2 inches (2.5-5 cm) farther away from your buttocks. ? If this exercise is too easy, try doing it with your arms crossed over your chest. 3. Hold this position for __________ seconds. 4. Slowly lower your hips to the starting position. 5. Let your muscles relax completely after each repetition. Repeat __________ times. Complete this exercise __________ times a day. Squats This exercise strengthens the muscles in front of your thigh  and knee (quadriceps). 1. Stand in front of a table, with your feet and knees pointing straight ahead. You may rest your hands on the table for balance but not for support. 2. Slowly bend your knees and lower your hips like you are going to sit in a chair. ? Keep your weight over your heels, not over your toes. ? Keep your lower legs upright so they are parallel with the table legs. ? Do not let your hips go lower than your knees. ? Do not bend lower than told by your health care provider. ? If your hip pain increases, do not bend as low. 3. Hold the squat position for __________ seconds. 4. Slowly push with your legs to return to standing. Do not use your hands to pull yourself to standing. Repeat __________ times. Complete this exercise __________ times a day. Hip hike 1. Stand sideways on a bottom step. Stand on your left / right leg with your other foot unsupported next to the step. You can hold on to the railing or wall  for balance if needed. 2. Keep your knees straight and your torso square. Then lift your left / right hip up toward the ceiling. 3. Hold this position for __________ seconds. 4. Slowly let your left / right hip lower toward the floor, past the starting position. Your foot should get closer to the floor. Do not lean or bend your knees. Repeat __________ times. Complete this exercise __________ times a day. Single leg stand 1. Without shoes, stand near a railing or in a doorway. You may hold on to the railing or door frame as needed for balance. 2. Squeeze your left / right buttock muscles, then lift up your other foot. ? Do not let your left / right hip push out to the side. ? It is helpful to stand in front of a mirror for this exercise so you can watch your hip. 3. Hold this position for __________ seconds. Repeat __________ times. Complete this exercise __________ times a day. This information is not intended to replace advice given to you by your health care provider. Make sure you discuss any questions you have with your health care provider. Document Released: 03/24/2004 Document Revised: 06/11/2018 Document Reviewed: 06/11/2018 Elsevier Patient Education  2020 [employer].

## 2018-11-21 NOTE — Progress Notes (Signed)
Brooke [name] DOB: 26-Aug-1950 Encounter date: 11/21/2018  This is a 68 y.o. female who presents for complete physical   History of present illness/Additional concerns: Wanting to find out what she can do about knees. Not wanting to take medication. Right knee in last week has been almost hard to bend. Hip on right also sore. Can't walk like she used to.  Ache coming from hip on right. Some swelling in right knee. Right hand - around thumb joint does have some achiness. Tries to work hands regularly and do stretching at home to help with this. Feels like strength is a little less than previous. Also working on losing weight. Very challenging for her because she isn't as active, so feels this slows weight loss. Would like to get into regular exercise routine.   Repeat liver enzymes were normal from September labwork.   HTN: started taking 1/2 tab of losartan sept 8th (12.5mg ). not sure if related to prescription or if it is what she is eating - hard stool; almost like rocks. Good water drinking. Given up bread, not eating much cereal. Usually eats just once daily. Just feels better if she waits until early afternoon to eat. States she is good about getting protein in diet. Maybe not as good with vegetables. Had constipation with going through chemo, but otherwise hasn't had much difficulty. Had noted a little fluttering sensation in neck veins when bp was higher, but this has resolved.   Has been working on weight loss and has lost 10 pounds in last couple of months. Has made a lot of changes that she can stick to. Working to reduce carbs, direct sugars.   Past Medical History:  Diagnosis Date  . Anemia   . Anxiety   . Arthritis   . Bilateral dry eyes   . Breast cancer, left breast ([place])   . Family history of breast cancer 02/02/2017  . Family history of breast cancer   . Family history of colon cancer 02/02/2017  . Family history of colon cancer   . Family history of prostate cancer   .  Family history of prostate cancer in father 02/02/2017  . Family history of uterine cancer   . Fibroid   . History of kidney stones   . Hyperlipidemia   . Kidney stones   . Melanoma of upper arm ([place]) 2009   RIGHT ARM   . Obesity (BMI 30.0-34.9)   . Osteopenia 09/2017   T score -1.9 max 8% / 0.8% statistically significant decline at right and left hip stable at the spine  . PONV (postoperative nausea and vomiting)   . Squamous carcinoma     of the skin  . UTI (urinary tract infection)   . Varicose veins of both lower extremities   . Vitamin D deficiency 07/2009   LOW VITAMIN D 29  . Vitamin deficiency 07/2008   VITAMIN D LOW 23   Past Surgical History:  Procedure Laterality Date  . ABDOMINAL SURGERY  1975   ovairan cyst   . APPENDECTOMY    . BREAST LUMPECTOMY WITH RADIOACTIVE SEED LOCALIZATION Right 09/25/2017   Procedure: RIGHT BREAST LUMPECTOMY WITH RADIOACTIVE SEED LOCALIZATION;  Surgeon: Jovita Kussmaul, MD;  Location: [facility];  Service: General;  Laterality: Right;  . BREAST SURGERY     bio  . CHOLECYSTECTOMY    . DERMOID CYST  EXCISION    . DILATION AND CURETTAGE OF UTERUS  1998  . GALLBLADDER SURGERY    . HYSTEROSCOPY    .  insertion  port a cath  03/02/2016  . MASTECTOMY MODIFIED RADICAL Left 09/25/2017   Procedure: LEFT MODIFIED RADICAL MASTECTOMY;  Surgeon: Jovita Kussmaul, MD;  Location: [facility];  Service: General;  Laterality: Left;  . PORTACATH PLACEMENT N/A 03/02/2017   Procedure: INSERTION PORT-A-CATH;  Surgeon: Jovita Kussmaul, MD;  Location: [facility];  Service: General;  Laterality: N/A;  . SKIN BIOPSY    . SKIN CANCER EXCISION  2010,2011   2010-RIGHT ARM, 2011-LEFT LOWER LEG.-DR Rolm Bookbinder DERM. DR. Sarajane Jews IS HER DERM SURGEON.   Allergies  Allergen Reactions  . Neosporin [Neomycin-Bacitracin Zn-Polymyx] Swelling, Rash and Other (See Comments)    Blisters  . Benzalkonium Chloride Other (See Comments)    Redness (Pt is unaware)  It works by killing microorganisms  and inhibiting their future growth, and for this reason frequently appears as an ingredient in antibacterial hand wipes, antiseptic creams and anti-itch ointments.  . Latex Rash   Current Meds  Medication Sig  . losartan (COZAAR) 25 MG tablet Take 1 tablet (25 mg total) by mouth daily. (Patient taking differently: Take 12.5 mg by mouth daily. )  . Propylene Glycol (SYSTANE COMPLETE) 0.6 % SOLN Place 1 drop into both eyes daily.  . tamoxifen (NOLVADEX) 20 MG tablet Take 20 mg by mouth daily.  . vitamin C (ASCORBIC ACID) 500 MG tablet Take 1,000 mg by mouth daily.   Marland Kitchen VITAMIN D, CHOLECALCIFEROL, PO Take 6,000 Units by mouth daily.   . [DISCONTINUED] alclomethasone (ACLOVATE) 0.05 % cream Apply to affected area twice daily as needed for dermatitis  . [DISCONTINUED] betamethasone dipropionate (DIPROLENE) 0.05 % cream APPLY THIN LAYER BID FOR 3 TO 4 WEEKS  . [DISCONTINUED] ibuprofen (ADVIL,MOTRIN) 200 MG tablet Take 200 mg by mouth every 6 (six) hours as needed for headache or moderate pain.   Social History   Tobacco Use  . Smoking status: Never Smoker  . Smokeless tobacco: Never Used  Substance Use Topics  . Alcohol use: No    Frequency: Never    Comment: rare   Family History  Problem Relation Age of Onset  . Diabetes Father   . Hypertension Father   . Heart disease Father   . Prostate cancer Father 37  . Arthritis Father   . Breast cancer Sister 29       again at age 33, GT results unk  . Skin cancer Sister   . Colon cancer Paternal Grandfather 3  . Hypertension Mother   . Heart failure Mother   . Uterine cancer Sister 68       stage IV  . Breast cancer Cousin        mat first cousin dx under 77     Review of Systems  Constitutional: Negative for activity change, appetite change, chills, fatigue, fever and unexpected weight change.  HENT: Negative for congestion, ear pain, hearing loss, sinus pressure, sinus pain, sore throat and trouble swallowing.   Eyes: Negative for  pain and visual disturbance.  Respiratory: Negative for cough, chest tightness, shortness of breath and wheezing.   Cardiovascular: Negative for chest pain, palpitations and leg swelling.  Gastrointestinal: Negative for abdominal pain, blood in stool, constipation, diarrhea, nausea and vomiting.  Genitourinary: Negative for difficulty urinating and menstrual problem.  Musculoskeletal: Positive for arthralgias. Negative for back pain.  Skin: Negative for rash.  Neurological: Negative for dizziness, weakness, numbness and headaches.  Hematological: Negative for adenopathy. Does not bruise/bleed easily.  Psychiatric/Behavioral: Negative for sleep disturbance and suicidal  ideas. The patient is not nervous/anxious.     CBC:  Lab Results  Component Value Date   WBC 4.6 10/31/2018   HGB 13.5 10/31/2018   HGB 13.1 12/20/2017   HGB 13.0 03/03/2017   HCT 41.3 10/31/2018   HCT 39.7 03/03/2017   MCH 29.8 10/31/2018   MCHC 32.7 10/31/2018   RDW 13.2 10/31/2018   RDW 13.2 03/03/2017   PLT 238 10/31/2018   PLT 232 12/20/2017   PLT 281 03/03/2017   MPV 9.3 05/29/2015   CMP: Lab Results  Component Value Date   NA 140 10/31/2018   NA 141 03/03/2017   K 4.3 10/31/2018   K 3.3 (L) 03/03/2017   CL 105 10/31/2018   CO2 24 10/31/2018   CO2 28 03/03/2017   ANIONGAP 11 10/31/2018   GLUCOSE 112 (H) 10/31/2018   GLUCOSE 107 03/03/2017   BUN 18 10/31/2018   BUN 12.7 03/03/2017   CREATININE 0.87 10/31/2018   CREATININE 0.94 09/12/2018   CREATININE 0.9 03/03/2017   GFRAA >60 10/31/2018   GFRAA >60 09/12/2018   CALCIUM 9.3 10/31/2018   CALCIUM 9.4 03/03/2017   PROT 7.2 10/31/2018   PROT 6.5 03/03/2017   BILITOT 0.6 10/31/2018   BILITOT 0.6 09/12/2018   BILITOT 0.52 03/03/2017   ALKPHOS 70 10/31/2018   ALKPHOS 71 03/03/2017   ALT 32 10/31/2018   ALT 84 (H) 09/12/2018   ALT 25 03/03/2017   AST 18 10/31/2018   AST 43 (H) 09/12/2018   AST 23 03/03/2017   LIPID: Lab Results  Component  Value Date   CHOL 192 08/13/2018   TRIG 255.0 (H) 08/13/2018   HDL 51.00 08/13/2018   LDLCALC 135 (H) 12/16/2016    Objective:  BP 122/80 (BP Location: Right Arm, Patient Position: Sitting, Cuff Size: Large)   Pulse 90   Temp 98.2 F (36.8 C) (Temporal)   Ht 5\' 9"  (1.753 m)   Wt 239 lb 4.8 oz (108.5 kg)   LMP 03/18/1997   SpO2 97%   BMI 35.34 kg/m   Weight: 239 lb 4.8 oz (108.5 kg)   BP Readings from Last 3 Encounters:  11/21/18 122/80  09/12/18 (!) 161/102  04/11/18 (!) 162/91   Wt Readings from Last 3 Encounters:  11/21/18 239 lb 4.8 oz (108.5 kg)  09/12/18 241 lb (109.3 kg)  04/11/18 254 lb 9.6 oz (115.5 kg)    Physical Exam Constitutional:      General: She is not in acute distress.    Appearance: She is well-developed.  HENT:     Head: Normocephalic and atraumatic.     Right Ear: Tympanic membrane and ear canal normal.     Left Ear: There is impacted cerumen.     Ears:     Comments: There is scaling external ears posterior external ears and earlobes.    Mouth/Throat:     Pharynx: No oropharyngeal exudate.  Eyes:     Conjunctiva/sclera: Conjunctivae normal.     Pupils: Pupils are equal, round, and reactive to light.  Neck:     Musculoskeletal: Normal range of motion and neck supple.     Thyroid: No thyromegaly.  Cardiovascular:     Rate and Rhythm: Normal rate and regular rhythm.     Heart sounds: Murmur present. Systolic murmur present with a grade of 1/6. No friction rub. No gallop.   Pulmonary:     Effort: Pulmonary effort is normal.     Breath sounds: Normal breath sounds.  Abdominal:  General: Bowel sounds are normal. There is no distension.     Palpations: Abdomen is soft. There is no mass.     Tenderness: There is no abdominal tenderness. There is no guarding.     Hernia: No hernia is present.  Musculoskeletal: Normal range of motion.        General: No tenderness or deformity.     Comments: Full flexion of right knee is restricted by about  10 degrees.  She does have some crepitance of both knees on extension and flexion.  She has anterior right knee tenderness, but more specifically medial lateral joint line tenderness.  There is no pain with knee grinding.  No laxity appreciated in the knee.  Overall bony enlargement of the right knee.  Lymphadenopathy:     Cervical: No cervical adenopathy.  Skin:    General: Skin is warm and dry.     Findings: No rash.     Comments: Scaling and erythema base of scalp and ears.  Neurological:     Mental Status: She is alert and oriented to person, place, and time.     Deep Tendon Reflexes: Reflexes normal.     Reflex Scores:      Tricep reflexes are 2+ on the right side and 2+ on the left side.      Bicep reflexes are 2+ on the right side and 2+ on the left side.      Brachioradialis reflexes are 2+ on the right side and 2+ on the left side.      Patellar reflexes are 2+ on the right side and 2+ on the left side. Psychiatric:        Speech: Speech normal.        Behavior: Behavior normal.        Thought Content: Thought content normal.     Assessment/Plan: Health Maintenance Due  Topic Date Due  . COLONOSCOPY  12/08/2000  . INFLUENZA VACCINE  09/29/2018   Health Maintenance reviewed  1. Preventative health care She is doing a great job with diet and exercise.  She has been losing weight.  Keep this up.  She is otherwise up-to-date with health maintenance.  2. Arthralgia, unspecified joint We discussed options today.  She does have significant knee pain.  She does not want surgery and would like to avoid injections if possible.  We discussed starting with physical therapy and some home stretches as well as icing after activity.  Try topical Voltaren gel to help with inflammation.  If any worsening or if she desires, it would be fine to refer her to Ortho for further evaluation. - Ambulatory referral to Physical Therapy  3. Constipation, unspecified constipation type She states  that water intake has not been as good and she is having a lot of hot flashes.  Wonders if she is sweating and this is contributing to constipation.  We discussed adding in MiraLAX at least a few times a week to help pull some fluid in to the stools.  We discussed keeping up with water intake.  Let me know if this is not working.  4. Hypertension: Blood pressure is improved today.  Continue with half tablet of losartan daily.  We discussed that she continues to lose weight we may be able to decrease this, but for now we will continue since blood pressure is in a normal range.   5. Need for immunization against influenza  - Flu Vaccine QUAD High Dose(Fluad)  6. Chronic pain of right  knee See above.  We discussed using tart cherry juice as an anti-inflammatory and considered glucosamine addition as well. - Ambulatory referral to Physical Therapy  7. Trochanteric bursitis, right hip Discussed some stretches for IT band and discussed using ice to help decrease inflammation of the bursa.  She would like to avoid injections if possible. - Ambulatory referral to Physical Therapy  8. Eczema of both external ears Triamcinolone cream to external ears bilaterally and posterior hairline.  Let me know if this is not working to improve eczema.  This is a new problem for her that started after chemotherapy.  9. Eczema of scalp See above.  10.  Cerumen impaction left: Unable to resolve this in office with irrigation today.  Encouraged her to use earwax softening drops at home and let shower flush ear.  If she feels that ear is not clear, she can return to office for treatment again.  She does have some questionable eustachian tube dysfunction in the right ear.  We discussed using antihistamine at bedtime.  We also discussed that this may just resolve on its own in the next couple weeks.  She will update me if she feels that this is not improved.  Return for pending mychart update in 2 weeks time.  Micheline Rough, MD

## 2018-11-21 NOTE — Telephone Encounter (Signed)
Patient was informed during the office visit today the order for the Cologuard from 5/27 was faxed to [facility] at [phone].

## 2018-11-22 MED ORDER — TRIAMCINOLONE ACETONIDE 0.1 % EX CREA: 1.0000 "application " | TOPICAL_CREAM | Freq: Two times a day (BID) | CUTANEOUS | 2 refills | Status: DC

## 2018-12-05 ENCOUNTER — Other Ambulatory Visit: Payer: Self-pay

## 2018-12-05 ENCOUNTER — Encounter: Payer: Self-pay | Admitting: Gynecology

## 2018-12-05 ENCOUNTER — Ambulatory Visit: Payer: Medicare Other | Attending: Family Medicine

## 2018-12-05 DIAGNOSIS — M25561 Pain in right knee: Secondary | ICD-10-CM | POA: Diagnosis present

## 2018-12-05 DIAGNOSIS — M25551 Pain in right hip: Secondary | ICD-10-CM | POA: Diagnosis present

## 2018-12-05 DIAGNOSIS — M6281 Muscle weakness (generalized): Secondary | ICD-10-CM

## 2018-12-05 DIAGNOSIS — R293 Abnormal posture: Secondary | ICD-10-CM | POA: Diagnosis present

## 2018-12-05 DIAGNOSIS — G8929 Other chronic pain: Secondary | ICD-10-CM

## 2018-12-05 NOTE — Patient Instructions (Addendum)
Access Code: [email or web address]  URL: [email or web address]  Date: 12/05/2018  Prepared by: Sigurd Sos   Exercises Hooklying Single Knee to Chest - 3 reps - 1 sets - 20 hold - 3x daily - 7x weekly Seated Hamstring Stretch - 3 reps - 1 sets - 20 hold - 3x daily - 7x weekly Seated Long Arc Quad - 10 reps - 1 sets - 3x daily - 7x weekly

## 2018-12-05 NOTE — Therapy (Signed)
[facility] [address] [place], [place], [address] Phone: [phone]   Fax:  [phone]  Physical Therapy Evaluation  Patient Details  Name: Brooke [name] MRN: [id number] Date of Birth: [birth year] Referring Provider (PT): Micheline Rough, MD   Encounter Date: 12/05/2018  PT End of Session - 12/05/18 1148    Visit Number  1    Date for PT Re-Evaluation  01/30/19    PT Start Time  0934    PT Stop Time  1015    PT Time Calculation (min)  41 min    Activity Tolerance  Patient tolerated treatment well    Behavior During Therapy  [facility] for tasks assessed/performed       Past Medical History:  Diagnosis Date  . Anemia   . Anxiety   . Arthritis   . Bilateral dry eyes   . Breast cancer, left breast ([place])   . Family history of breast cancer 02/02/2017  . Family history of breast cancer   . Family history of colon cancer 02/02/2017  . Family history of colon cancer   . Family history of prostate cancer   . Family history of prostate cancer in father 02/02/2017  . Family history of uterine cancer   . Fibroid   . History of kidney stones   . Hyperlipidemia   . Kidney stones   . Melanoma of upper arm ([place]) 2009   RIGHT ARM   . Obesity (BMI 30.0-34.9)   . Osteopenia 09/2017   T score -1.9 max 8% / 0.8% statistically significant decline at right and left hip stable at the spine  . PONV (postoperative nausea and vomiting)   . Squamous carcinoma     of the skin  . UTI (urinary tract infection)   . Varicose veins of both lower extremities   . Vitamin D deficiency 07/2009   LOW VITAMIN D 29  . Vitamin deficiency 07/2008   VITAMIN D LOW 23    Past Surgical History:  Procedure Laterality Date  . ABDOMINAL SURGERY  1975   ovairan cyst   . APPENDECTOMY    . BREAST LUMPECTOMY WITH RADIOACTIVE SEED LOCALIZATION Right 09/25/2017   Procedure: RIGHT BREAST LUMPECTOMY WITH RADIOACTIVE SEED LOCALIZATION;  Surgeon: Jovita Kussmaul, MD;  Location: [place];  Service: General;  Laterality: Right;  . BREAST SURGERY     bio  . CHOLECYSTECTOMY    . DERMOID CYST  EXCISION    . DILATION AND CURETTAGE OF UTERUS  1998  . GALLBLADDER SURGERY    . HYSTEROSCOPY    . insertion  port a cath  03/02/2016  . MASTECTOMY MODIFIED RADICAL Left 09/25/2017   Procedure: LEFT MODIFIED RADICAL MASTECTOMY;  Surgeon: Jovita Kussmaul, MD;  Location: [place];  Service: General;  Laterality: Left;  . PORTACATH PLACEMENT N/A 03/02/2017   Procedure: INSERTION PORT-A-CATH;  Surgeon: Jovita Kussmaul, MD;  Location: [place];  Service: General;  Laterality: N/A;  . SKIN BIOPSY    . SKIN CANCER EXCISION  2010,2011   2010-RIGHT ARM, 2011-LEFT LOWER LEG.-DR Rolm Bookbinder DERM. DR. Sarajane Jews IS HER DERM SURGEON.    There were no vitals filed for this visit.   Subjective Assessment - 12/05/18 0938    Subjective  Pt presents to PT with Rt hip and knee pain that began ~1 month ago without cause.  Pt reports chronic bil knee pain.  No imaging has been done.    Pertinent History  Lt  breast cancer    Limitations  Standing;Walking    How long can you stand comfortably?  10-15 minutes    How long can you walk comfortably?  5 minutes    Diagnostic tests  none    Patient Stated Goals  begin exercising without incresing knee pain, walk    Currently in Pain?  Yes    Pain Score  8    Rt hip and knee.  5-6/10 bil   Pain Location  Leg   hip and knee (Rt>Lt)   Pain Orientation  Right    Pain Descriptors / Indicators  Aching;Sore;Tightness    Pain Type  Chronic pain    Pain Onset  More than a month ago    Pain Frequency  Constant    Aggravating Factors   standing, walking    Pain Relieving Factors  sitting         [facility] PT Assessment - 12/05/18 0001      Assessment   Medical Diagnosis  athralgia, unspecified joint, chronic pain of Rt knee, trochanteric bursitis Rt    Referring Provider (PT)  Micheline Rough, MD    Onset Date/Surgical Date  11/05/18     Next MD Visit  as needed     Prior Therapy  none      Precautions   Precautions  Other (comment)   history of breast cancer     Restrictions   Weight Bearing Restrictions  No      Balance Screen   Has the patient fallen in the past 6 months  No    Has the patient had a decrease in activity level because of a fear of falling?   No    Is the patient reluctant to leave their home because of a fear of falling?   No      Home Environment   Living Environment  Private residence    Living Arrangements  Alone    Type of [place] to enter    Entrance Stairs-Number of Steps  3    [place]  One level      Prior Function   Level of [place]  Retired    Leisure  would like to exercise      Cognition   Overall Cognitive Status  Within Functional Limits for tasks assessed      Observation/Other Assessments   Focus on Therapeutic Outcomes (FOTO)   60% limitation      Posture/Postural Control   Posture/Postural Control  Postural limitations    Postural Limitations  Flexed trunk;Rounded Shoulders      ROM / Strength   AROM / PROM / Strength  AROM;PROM;Strength      AROM   Overall AROM   Deficits    AROM Assessment Site  Knee    Right/Left Knee  Right;Left    Right Knee Extension  19    Right Knee Flexion  119    Left Knee Extension  11    Left Knee Flexion  115      PROM   Overall PROM   Deficits;Due to pain    Overall PROM Comments  not able to test knee P/ROM due to pain.  Pt with hip P/ROM limited by 25% with Rt gluteal pain with flexion      Strength   Overall Strength  Deficits    Strength Assessment Site  Knee;Hip    Right/Left  Hip  Right;Left    Right Hip Flexion  3+/5    Right Hip External Rotation   4/5    Right Hip Internal Rotation  4/5    Right Hip ABduction  4-/5    Left Hip Flexion  3+/5    Left Hip External Rotation  4+/5    Left Hip Internal Rotation  4+/5    Left Hip ABduction  4/5     Right/Left Knee  Right;Left    Right Knee Flexion  4+/5    Right Knee Extension  4-/5    Left Knee Flexion  4+/5    Left Knee Extension  3+/5      Palpation   Patella mobility  unable to asses due to bil knee flexion    Palpation comment  palpable tenderness and trigger points over Rt gluteals, diffuse tenderness over Rt>Lt knee joint       Transfers   Transfers  Sit to Stand;Stand to Sit    Sit to Stand  With upper extremity assist    Stand to Sit  With upper extremity assist      Ambulation/Gait   Ambulation/Gait  Yes    Gait Pattern  Step-through pattern;Trunk flexed;Antalgic    Stairs  Yes    Stairs Assistance  7: Independent    Stair Management Technique  Alternating pattern    Gait Comments  slow gait on steps with reduced eccentric control                Objective measurements completed on examination: See above findings.              PT Education - 12/05/18 1012    Education Details  Access Code: [email or web address]    Person(s) Educated  Patient    Methods  Explanation;Demonstration;Handout    Comprehension  Verbalized understanding;Returned demonstration       PT Short Term Goals - 12/05/18 1047      PT SHORT TERM GOAL #1   Title  be independent in initial HEP    Time  4    Period  Weeks    Status  New    Target Date  01/02/19      PT SHORT TERM GOAL #2   Title  initiate a walking program and verbalize understanding of how to progress slowly    Time  4    Period  Weeks    Status  New    Target Date  01/02/19      PT SHORT TERM GOAL #3   Title  report < or = to 6/10 max Rt LE pain with standing and walking to improve tolerance for home and community    Time  4    Period  Weeks    Status  New    Target Date  01/02/19      PT SHORT TERM GOAL #4   Title  demonstrate bil knee extension to lacking < or = to 10 degrees to improve gait pattern    Time  4    Period  Weeks    Status  New    Target Date  01/02/19        PT Long Term Goals -  12/05/18 1058      PT LONG TERM GOAL #1   Title  be independent in advanced HEP    Baseline  --    Time  8    Period  Weeks    Status  New  Target Date  01/30/19      PT LONG TERM GOAL #2   Title  reduce FOTO to < or = to 37% limitation    Baseline  --    Time  8    Period  Weeks    Status  New    Target Date  01/30/19      PT LONG TERM GOAL #3   Title  perform regular walking or gym exercise and verbalize understanding of safe progression for strength and endurance    Baseline  --    Time  8    Period  Weeks    Status  New    Target Date  01/30/19      PT LONG TERM GOAL #4   Title  report a 60% reduction in bil knee and Rt hip pain with standing and walking to improve tolerance    Baseline  --    Time  8    Period  Weeks    Status  New    Target Date  01/30/19      PT LONG TERM GOAL #5   Title  demonstrate 4+/5 bil knee strength to improve endurance and safety on steps    Baseline  --    Time  8    Period  Weeks    Status  New    Target Date  01/30/19             Plan - 12/05/18 1115    Clinical Impression Statement  Pt presents to PT with complaints of bil knee pain and Rt gluteal pain.  Pt reports chronic bil. Knee stiffness and she has had to stop walking for exercise over the past months.  Rt hip and knee flared-up a month ago without incident.  Pt reports 6-8/10 Rt knee pain that increases with standing, walking and negotiating steps.  Pt is limited to standing 10-15 minutes max and walking 5 minutes.  Rt knee A/ROM is 19-119 degrees and Lt 11-115 degrees with significant Rt knee pain with A/ROM. Pt demonstrates bil hip and knee weakness.   Pt with diffuse palpable tenderness over bil knee joints and crepitus noted bilaterally with open chain exercise.  Pt will benefit from gentle progression of bil knee A/ROM, hip flexibility, LE strength, exercise, manual therapy to address pain and modalities as needed.    Personal Factors and Comorbidities  Comorbidity  2    Comorbidities  osteopenia, HTN, history of breast cancer    Examination-Activity Limitations  Locomotion Level;Stand;Stairs;Squat;Transfers    Examination-Participation Restrictions  Cleaning;Community Activity;Meal Prep    Stability/Clinical Decision Making  Evolving/Moderate complexity    Clinical Decision Making  Moderate    Rehab Potential  Good    PT Frequency  2x / week    PT Duration  8 weeks    PT Treatment/Interventions  ADLs/Self Care Home Management;Cryotherapy;Ultrasound;Moist Heat;Electrical Stimulation;Gait training;Stair training;Balance training;Therapeutic exercise;Therapeutic activities;Functional mobility training;Neuromuscular re-education;Patient/family education;Manual techniques;Passive range of motion;Dry needling;Taping;Spinal Manipulations    PT Next Visit Plan  review HEP, try NuStep and/or recumbent bike, knee ROM, gentle strength for knees and hips    PT Home Exercise Plan  Access Code: [email or web address]    Consulted and Agree with Plan of Care  Patient       Patient will benefit from skilled therapeutic intervention in order to improve the following deficits and impairments:  Abnormal gait, Decreased activity tolerance, Decreased endurance, Decreased range of motion, Decreased mobility, Decreased strength, Difficulty walking, Impaired flexibility, Postural  dysfunction, Improper body mechanics  Visit Diagnosis: Abnormal posture - Plan: PT plan of care cert/re-cert  Chronic pain of right knee - Plan: PT plan of care cert/re-cert  Pain in right hip - Plan: PT plan of care cert/re-cert  Muscle weakness (generalized) - Plan: PT plan of care cert/re-cert     Problem List Patient Active Problem List   Diagnosis Date Noted  . Genetic testing 12/14/2017  . Family history of uterine cancer   . Family history of prostate cancer   . Melanoma in situ of right upper extremity ([place]) 07/18/2017  . Port-A-Cath in place 04/18/2017  . Drug-induced neutropenia ([place])  03/09/2017  . Family history of breast cancer 02/02/2017  . Family history of colon cancer 02/02/2017  . Family history of prostate cancer in father 02/02/2017  . Malignant neoplasm of overlapping sites of left breast in female, estrogen receptor positive ([place]) 01/31/2017  . Hx of hot flashes, menopausal, HRT in the past 12/19/2011  . Osteopenia, stable, DEXA 2013, recs to repeat in 2018 12/19/2011  . Hx of Hyperlipidemia, LDL 144 in 07/2011 12/19/2011  . Vitamin D insufficiency 12/19/2011  . Fibroid      Sigurd Sos, PT 12/05/18 11:49 AM  [facility] [address], [place], [place], [address] Phone: [phone]   Fax:  [phone]  Name: Brooke [name] MRN: [id number] Date of Birth: January 16, 1951

## 2018-12-12 ENCOUNTER — Encounter: Payer: Medicare Other | Admitting: Physical Therapy

## 2018-12-14 ENCOUNTER — Ambulatory Visit: Payer: Medicare Other | Admitting: Physical Therapy

## 2018-12-17 ENCOUNTER — Ambulatory Visit: Payer: Medicare Other | Admitting: Physical Therapy

## 2018-12-17 ENCOUNTER — Encounter: Payer: Self-pay | Admitting: Physical Therapy

## 2018-12-17 ENCOUNTER — Other Ambulatory Visit: Payer: Self-pay

## 2018-12-17 DIAGNOSIS — R293 Abnormal posture: Secondary | ICD-10-CM

## 2018-12-17 DIAGNOSIS — G8929 Other chronic pain: Secondary | ICD-10-CM

## 2018-12-17 DIAGNOSIS — M6281 Muscle weakness (generalized): Secondary | ICD-10-CM

## 2018-12-17 DIAGNOSIS — M25561 Pain in right knee: Secondary | ICD-10-CM

## 2018-12-17 DIAGNOSIS — M25551 Pain in right hip: Secondary | ICD-10-CM

## 2018-12-17 NOTE — Therapy (Signed)
[facility] [address] [place], [place], [address] Phone: [phone]   Fax:  [phone]  Physical Therapy Treatment  Patient Details  Name: Brooke [name] MRN: [id number] Date of Birth: 1950-05-07 Referring Provider (PT): Micheline Rough, MD   Encounter Date: 12/17/2018  PT End of Session - 12/17/18 1218    Visit Number  2    Date for PT Re-Evaluation  01/30/19    PT Start Time  0930    PT Stop Time  1016    PT Time Calculation (min)  46 min    Activity Tolerance  Patient tolerated treatment well    Behavior During Therapy  [facility] for tasks assessed/performed       Past Medical History:  Diagnosis Date  . Anemia   . Anxiety   . Arthritis   . Bilateral dry eyes   . Breast cancer, left breast ([place])   . Family history of breast cancer 02/02/2017  . Family history of breast cancer   . Family history of colon cancer 02/02/2017  . Family history of colon cancer   . Family history of prostate cancer   . Family history of prostate cancer in father 02/02/2017  . Family history of uterine cancer   . Fibroid   . History of kidney stones   . Hyperlipidemia   . Kidney stones   . Melanoma of upper arm ([place]) 2009   RIGHT ARM   . Obesity (BMI 30.0-34.9)   . Osteopenia 09/2017   T score -1.9 max 8% / 0.8% statistically significant decline at right and left hip stable at the spine  . PONV (postoperative nausea and vomiting)   . Squamous carcinoma     of the skin  . UTI (urinary tract infection)   . Varicose veins of both lower extremities   . Vitamin D deficiency 07/2009   LOW VITAMIN D 29  . Vitamin deficiency 07/2008   VITAMIN D LOW 23    Past Surgical History:  Procedure Laterality Date  . ABDOMINAL SURGERY  1975   ovairan cyst   . APPENDECTOMY    . BREAST LUMPECTOMY WITH RADIOACTIVE SEED LOCALIZATION Right 09/25/2017   Procedure: RIGHT BREAST LUMPECTOMY WITH RADIOACTIVE SEED LOCALIZATION;  Surgeon: Jovita Kussmaul, MD;  Location: [place];  Service: General;  Laterality: Right;  . BREAST SURGERY     bio  . CHOLECYSTECTOMY    . DERMOID CYST  EXCISION    . DILATION AND CURETTAGE OF UTERUS  1998  . GALLBLADDER SURGERY    . HYSTEROSCOPY    . insertion  port a cath  03/02/2016  . MASTECTOMY MODIFIED RADICAL Left 09/25/2017   Procedure: LEFT MODIFIED RADICAL MASTECTOMY;  Surgeon: Jovita Kussmaul, MD;  Location: [place];  Service: General;  Laterality: Left;  . PORTACATH PLACEMENT N/A 03/02/2017   Procedure: INSERTION PORT-A-CATH;  Surgeon: Jovita Kussmaul, MD;  Location: [place];  Service: General;  Laterality: N/A;  . SKIN BIOPSY    . SKIN CANCER EXCISION  2010,2011   2010-RIGHT ARM, 2011-LEFT LOWER LEG.-DR Rolm Bookbinder DERM. DR. Sarajane Jews IS HER DERM SURGEON.    There were no vitals filed for this visit.  Subjective Assessment - 12/17/18 0933    Subjective  Pt doing ok.  Maybe less stiffness into knee flexion since eval.  HEP going fine.    Pertinent History  Lt breast cancer    How long can you stand comfortably?  10-15 minutes  How long can you walk comfortably?  5 minutes    Diagnostic tests  none    Patient Stated Goals  begin exercising without incresing knee pain, walk    Currently in Pain?  Yes    Pain Score  6     Pain Location  Knee    Pain Orientation  Right    Pain Descriptors / Indicators  Aching;Tightness    Pain Type  Chronic pain    Pain Onset  More than a month ago    Pain Frequency  Constant    Aggravating Factors   standing walking    Pain Relieving Factors  sitting                       [facility] Adult PT Treatment/Exercise - 12/17/18 0001      Ambulation/Gait   Ambulation/Gait  --    Ambulation/Gait Assistance  --   Rollator     Exercises   Exercises  Lumbar;Knee/Hip      Knee/Hip Exercises: Stretches   Active Hamstring Stretch  Both;1 rep;30 seconds    Hip Flexor Stretch  Left    Hip Flexor Stretch Limitations  on stairs      Knee/Hip Exercises:  Aerobic   Nustep  L1 seat 12, arms 10, 3', PT present to monitor pain       Knee/Hip Exercises: Standing   Hip Abduction  Stengthening;Both;1 set;5 sets      Knee/Hip Exercises: Seated   Long Arc Quad  Both;3 sets;5 reps    Long Arc Quad Limitations  with ball squeeze    Hamstring Curl  Strengthening;Both;2 sets;10 reps    Hamstring Limitations  blue band around ankles    Sit to Sand  1 set   pain, d/c'd     Knee/Hip Exercises: Supine   Quad Sets  Strengthening;Right;5 reps    Quad Sets Limitations  with towel roll under knee    Heel Slides  AROM;10 reps;Both    Heel Slides Limitations  with quad set    Other Supine Knee/Hip Exercises  glute set 10x3 sec               PT Short Term Goals - 12/05/18 1047      PT SHORT TERM GOAL #1   Title  be independent in initial HEP    Time  4    Period  Weeks    Status  New    Target Date  01/02/19      PT SHORT TERM GOAL #2   Title  initiate a walking program and verbalize understanding of how to progress slowly    Time  4    Period  Weeks    Status  New    Target Date  01/02/19      PT SHORT TERM GOAL #3   Title  report < or = to 6/10 max Rt LE pain with standing and walking to improve tolerance for home and community    Time  4    Period  Weeks    Status  New    Target Date  01/02/19      PT SHORT TERM GOAL #4   Title  demonstrate bil knee extension to lacking < or = to 10 degrees to improve gait pattern    Time  4    Period  Weeks    Status  New    Target Date  01/02/19  PT Long Term Goals - 12/05/18 1058      PT LONG TERM GOAL #1   Title  be independent in advanced HEP    Baseline  --    Time  8    Period  Weeks    Status  New    Target Date  01/30/19      PT LONG TERM GOAL #2   Title  reduce FOTO to < or = to 37% limitation    Baseline  --    Time  8    Period  Weeks    Status  New    Target Date  01/30/19      PT LONG TERM GOAL #3   Title  perform regular walking or gym exercise and  verbalize understanding of safe progression for strength and endurance    Baseline  --    Time  8    Period  Weeks    Status  New    Target Date  01/30/19      PT LONG TERM GOAL #4   Title  report a 60% reduction in bil knee and Rt hip pain with standing and walking to improve tolerance    Baseline  --    Time  8    Period  Weeks    Status  New    Target Date  01/30/19      PT LONG TERM GOAL #5   Title  demonstrate 4+/5 bil knee strength to improve endurance and safety on steps    Baseline  --    Time  8    Period  Weeks    Status  New    Target Date  01/30/19            Plan - 12/17/18 1218    Clinical Impression Statement  PT with careful monitoring of Pt's knee pain bil with adjustments made throughout visit to accommodate.  Pt reported feeling very encouraged by what she was able to do today and PT updated HEP.  Pt with report of Lt anterior hip pinching pain with some ROM of hip/knee today, which improved somewhat with hip flexor stretch.  Pt will continue to benefit from skilled PT along current POC for ROM, strength of bil knees and hips.    Comorbidities  osteopenia, HTN, history of breast cancer    PT Frequency  2x / week    PT Duration  8 weeks    PT Treatment/Interventions  ADLs/Self Care Home Management;Cryotherapy;Ultrasound;Moist Heat;Electrical Stimulation;Gait training;Stair training;Balance training;Therapeutic exercise;Therapeutic activities;Functional mobility training;Neuromuscular re-education;Patient/family education;Manual techniques;Passive range of motion;Dry needling;Taping;Spinal Manipulations    PT Next Visit Plan  address Lt anterior hip pinching (hip flexor stretch, piriformis, quad stretch), NuStep, heel slides, quad sets with towel roll, progress hip and knee strength as tol    PT Home Exercise Plan  Access Code: [email or web address]    Consulted and Agree with Plan of Care  Patient       Patient will benefit from skilled therapeutic intervention in order  to improve the following deficits and impairments:     Visit Diagnosis: Abnormal posture  Chronic pain of right knee  Pain in right hip  Muscle weakness (generalized)     Problem List Patient Active Problem List   Diagnosis Date Noted  . Genetic testing 12/14/2017  . Family history of uterine cancer   . Family history of prostate cancer   . Melanoma in situ of right upper extremity ([place]) 07/18/2017  . Port-A-Cath  in place 04/18/2017  . Drug-induced neutropenia ([place]) 03/09/2017  . Family history of breast cancer 02/02/2017  . Family history of colon cancer 02/02/2017  . Family history of prostate cancer in father 02/02/2017  . Malignant neoplasm of overlapping sites of left breast in female, estrogen receptor positive ([place]) 01/31/2017  . Hx of hot flashes, menopausal, HRT in the past 12/19/2011  . Osteopenia, stable, DEXA 2013, recs to repeat in 2018 12/19/2011  . Hx of Hyperlipidemia, LDL 144 in 07/2011 12/19/2011  . Vitamin D insufficiency 12/19/2011  . Fibroid     Baruch Merl, PT 12/17/18 12:24 PM   [facility] [address] [place], [place], [address] Phone: [phone]   Fax:  [phone]  Name: LUKA BURN MRN: [id number] Date of Birth: Aug 27, 1950

## 2018-12-19 ENCOUNTER — Ambulatory Visit: Payer: Medicare Other | Admitting: Physical Therapy

## 2018-12-19 ENCOUNTER — Other Ambulatory Visit: Payer: Self-pay

## 2018-12-19 ENCOUNTER — Encounter: Payer: Self-pay | Admitting: Physical Therapy

## 2018-12-19 DIAGNOSIS — M6281 Muscle weakness (generalized): Secondary | ICD-10-CM

## 2018-12-19 DIAGNOSIS — M25561 Pain in right knee: Secondary | ICD-10-CM

## 2018-12-19 DIAGNOSIS — R293 Abnormal posture: Secondary | ICD-10-CM | POA: Diagnosis not present

## 2018-12-19 DIAGNOSIS — M25551 Pain in right hip: Secondary | ICD-10-CM

## 2018-12-19 DIAGNOSIS — G8929 Other chronic pain: Secondary | ICD-10-CM

## 2018-12-19 NOTE — Therapy (Signed)
[facility] [address] [place], [place], [address] Phone: [phone]   Fax:  [phone]  Physical Therapy Treatment  Patient Details  Name: Brooke [name] MRN: [id number] Date of Birth: 24-Nov-1950 Referring Provider (PT): Micheline Rough, MD   Encounter Date: 12/19/2018  PT End of Session - 12/19/18 1247    Visit Number  3    Date for PT Re-Evaluation  01/30/19    Authorization Type  [employer]HC Medicare    PT Start Time  [id number]    PT Stop Time  1230    PT Time Calculation (min)  45 min    Activity Tolerance  Patient tolerated treatment well    Behavior During Therapy  [facility] for tasks assessed/performed       Past Medical History:  Diagnosis Date  . Anemia   . Anxiety   . Arthritis   . Bilateral dry eyes   . Breast cancer, left breast ([place])   . Family history of breast cancer 02/02/2017  . Family history of breast cancer   . Family history of colon cancer 02/02/2017  . Family history of colon cancer   . Family history of prostate cancer   . Family history of prostate cancer in father 02/02/2017  . Family history of uterine cancer   . Fibroid   . History of kidney stones   . Hyperlipidemia   . Kidney stones   . Melanoma of upper arm ([place]) 2009   RIGHT ARM   . Obesity (BMI 30.0-34.9)   . Osteopenia 09/2017   T score -1.9 max 8% / 0.8% statistically significant decline at right and left hip stable at the spine  . PONV (postoperative nausea and vomiting)   . Squamous carcinoma     of the skin  . UTI (urinary tract infection)   . Varicose veins of both lower extremities   . Vitamin D deficiency 07/2009   LOW VITAMIN D 29  . Vitamin deficiency 07/2008   VITAMIN D LOW 23    Past Surgical History:  Procedure Laterality Date  . ABDOMINAL SURGERY  1975   ovairan cyst   . APPENDECTOMY    . BREAST LUMPECTOMY WITH RADIOACTIVE SEED LOCALIZATION Right 09/25/2017   Procedure: RIGHT BREAST LUMPECTOMY WITH  RADIOACTIVE SEED LOCALIZATION;  Surgeon: Jovita Kussmaul, MD;  Location: [place];  Service: General;  Laterality: Right;  . BREAST SURGERY     bio  . CHOLECYSTECTOMY    . DERMOID CYST  EXCISION    . DILATION AND CURETTAGE OF UTERUS  1998  . GALLBLADDER SURGERY    . HYSTEROSCOPY    . insertion  port a cath  03/02/2016  . MASTECTOMY MODIFIED RADICAL Left 09/25/2017   Procedure: LEFT MODIFIED RADICAL MASTECTOMY;  Surgeon: Jovita Kussmaul, MD;  Location: [place];  Service: General;  Laterality: Left;  . PORTACATH PLACEMENT N/A 03/02/2017   Procedure: INSERTION PORT-A-CATH;  Surgeon: Jovita Kussmaul, MD;  Location: [place];  Service: General;  Laterality: N/A;  . SKIN BIOPSY    . SKIN CANCER EXCISION  2010,2011   2010-RIGHT ARM, 2011-LEFT LOWER LEG.-DR Rolm Bookbinder DERM. DR. Sarajane Jews IS HER DERM SURGEON.    There were no vitals filed for this visit.  Subjective Assessment - 12/19/18 1153    Subjective  My tailbone and front of Lt hip hurt with my HEP but I don't want to stop doing my exercises.  I may need some adjustments to the exercises though.  Pertinent History  Lt breast cancer    Limitations  Standing;Walking    How long can you stand comfortably?  10-15 minutes    How long can you walk comfortably?  5 minutes    Diagnostic tests  none    Patient Stated Goals  begin exercising without incresing knee pain, walk    Currently in Pain?  Yes    Pain Score  6     Pain Location  Knee    Pain Orientation  Right    Pain Descriptors / Indicators  Aching;Tightness    Pain Type  Chronic pain    Pain Onset  More than a month ago    Pain Frequency  Constant    Aggravating Factors   standing, walking, tailbone pain laying on back, Lt anterior hip pain with hip abduction    Pain Relieving Factors  sitting as long as not sitting on tailbone                       [facility] Adult PT Treatment/Exercise - 12/19/18 0001      Self-Care   Self-Care  Posture    Posture  sitting posture making  sure seat height is tall enough, use of lumbar towel roll to offload tailbone      Exercises   Exercises  Knee/Hip      Knee/Hip Exercises: Stretches   Hip Flexor Stretch  Left;30 seconds    Hip Flexor Stretch Limitations  on second step    Other Knee/Hip Stretches  Lt adductor in side lunge and with Lt foot on chair x 30" each, Lt      Knee/Hip Exercises: Standing   Heel Raises  Both;20 reps    Other Standing Knee Exercises  glute med on Lt isometric into wall with knee bent in slight Lt hip abd 3x5" (adjusted from standing hip abd at counter due to pain)      Knee/Hip Exercises: Seated   Long Arc Quad  Strengthening;Both;3 sets;5 reps    Long Arc Quad Limitations  with ball squeeze      Manual Therapy   Manual Therapy  Soft tissue mobilization    Soft tissue mobilization  Lt adductors               PT Short Term Goals - 12/19/18 1253      PT SHORT TERM GOAL #1   Title  be independent in initial HEP    Status  On-going      PT SHORT TERM GOAL #2   Title  initiate a walking program and verbalize understanding of how to progress slowly    Status  New        PT Long Term Goals - 12/05/18 1058      PT LONG TERM GOAL #1   Title  be independent in advanced HEP    Baseline  --    Time  8    Period  Weeks    Status  New    Target Date  01/30/19      PT LONG TERM GOAL #2   Title  reduce FOTO to < or = to 37% limitation    Baseline  --    Time  8    Period  Weeks    Status  New    Target Date  01/30/19      PT LONG TERM GOAL #3   Title  perform regular walking or gym exercise and verbalize  understanding of safe progression for strength and endurance    Baseline  --    Time  8    Period  Weeks    Status  New    Target Date  01/30/19      PT LONG TERM GOAL #4   Title  report a 60% reduction in bil knee and Rt hip pain with standing and walking to improve tolerance    Baseline  --    Time  8    Period  Weeks    Status  New    Target Date  01/30/19       PT LONG TERM GOAL #5   Title  demonstrate 4+/5 bil knee strength to improve endurance and safety on steps    Baseline  --    Time  8    Period  Weeks    Status  New    Target Date  01/30/19            Plan - 12/19/18 1248    Clinical Impression Statement  Much time spent today addressing Lt anterior hip pain which appears to be multi-factorial from adductors and anterior hip capsule pinching.  PT adjusted HEP and introduced gentle stretches for anterior and medial hip.  Adjusted HEP and educated Pt about sitting posture and chair height to reduce hip and tailbone pain.  Pt to use pillow under tailbone for supine exercises.  Pt will need careful monitoring of multiple pain sights for all ther ex and HEP prescription.  Continue along current POC.    Comorbidities  osteopenia, HTN, history of breast cancer    Rehab Potential  Good    PT Frequency  2x / week    PT Duration  8 weeks    PT Treatment/Interventions  ADLs/Self Care Home Management;Cryotherapy;Ultrasound;Moist Heat;Electrical Stimulation;Gait training;Stair training;Balance training;Therapeutic exercise;Therapeutic activities;Functional mobility training;Neuromuscular re-education;Patient/family education;Manual techniques;Passive range of motion;Dry needling;Taping;Spinal Manipulations    PT Next Visit Plan  f/u on Lt hip stretches, NuStep, heel slides, try sit to stand from highly elevated table and 2" step ups/downs, add clamshells    PT Home Exercise Plan  Access Code: [email or web address]    Consulted and Agree with Plan of Care  Patient       Patient will benefit from skilled therapeutic intervention in order to improve the following deficits and impairments:     Visit Diagnosis: Abnormal posture  Chronic pain of right knee  Pain in right hip  Muscle weakness (generalized)     Problem List Patient Active Problem List   Diagnosis Date Noted  . Genetic testing 12/14/2017  . Family history of uterine cancer   . Family  history of prostate cancer   . Melanoma in situ of right upper extremity ([place]) 07/18/2017  . Port-A-Cath in place 04/18/2017  . Drug-induced neutropenia ([place]) 03/09/2017  . Family history of breast cancer 02/02/2017  . Family history of colon cancer 02/02/2017  . Family history of prostate cancer in father 02/02/2017  . Malignant neoplasm of overlapping sites of left breast in female, estrogen receptor positive ([place]) 01/31/2017  . Hx of hot flashes, menopausal, HRT in the past 12/19/2011  . Osteopenia, stable, DEXA 2013, recs to repeat in 2018 12/19/2011  . Hx of Hyperlipidemia, LDL 144 in 07/2011 12/19/2011  . Vitamin D insufficiency 12/19/2011  . Fibroid     Baruch Merl, PT 12/19/18 12:54 PM   [facility] [address], [place], [place], [address] Phone:  [phone]   Fax:  [phone]  Name: Brooke [name] MRN: [id number] Date of Birth: 01-03-1951

## 2018-12-24 ENCOUNTER — Ambulatory Visit: Payer: Medicare Other | Admitting: Physical Therapy

## 2018-12-24 ENCOUNTER — Other Ambulatory Visit: Payer: Self-pay

## 2018-12-24 ENCOUNTER — Encounter: Payer: Self-pay | Admitting: Physical Therapy

## 2018-12-24 DIAGNOSIS — R293 Abnormal posture: Secondary | ICD-10-CM

## 2018-12-24 DIAGNOSIS — M25551 Pain in right hip: Secondary | ICD-10-CM

## 2018-12-24 DIAGNOSIS — M6281 Muscle weakness (generalized): Secondary | ICD-10-CM

## 2018-12-24 DIAGNOSIS — G8929 Other chronic pain: Secondary | ICD-10-CM

## 2018-12-24 NOTE — Patient Instructions (Signed)
Access Code: [email or web address]  URL: [email or web address]  Date: 12/24/2018  Prepared by: Venetia Night Rozelle Caudle   Exercises  Supine Heel Slides - 10 reps - 2 sets - 1x daily - 7x weekly  Hooklying Single Knee to Chest - 3 reps - 1 sets - 20 hold - 3x daily - 7x weekly  Seated Hamstring Stretch - 3 reps - 1 sets - 20 hold - 3x daily - 7x weekly  Seated Long Arc Quad - 10 reps - 1 sets - 3x daily - 7x weekly  Seated Hamstring Curls with Resistance - 10 reps - 3 sets - 1x daily - 7x weekly  Heel rises with counter support - 20 reps - 2 sets - 1x daily - 7x weekly  Seated Hip Abduction with Resistance - 10 reps - 2 sets - 1x daily - 7x weekly  Standing Hip Extension at Wall - 5 reps - 1 sets - 5 hold - 1x daily - 7x weekly  Isometric Gluteus Medius at Wall - 5 reps - 1 sets - 5 hold - 1x daily - 7x weekly  Hooklying Isometric Hip Flexion - 5 reps - 1 sets - 5 hold - 1x daily - 7x weekly

## 2018-12-24 NOTE — Therapy (Signed)
[facility] [address], [place], [place], [address] Phone: [phone]   Fax:  [phone]  Physical Therapy Treatment  Patient Details  Name: Brooke [name] MRN: [id number] Date of Birth: 03-May-1950 Referring Provider (PT): Micheline Rough, MD   Encounter Date: 12/24/2018  PT End of Session - 12/24/18 1016    Visit Number  4    Date for PT Re-Evaluation  01/30/19    Authorization Type  UHC Medicare    PT Start Time  0930    PT Stop Time  1016    PT Time Calculation (min)  46 min    Activity Tolerance  Patient tolerated treatment well    Behavior During Therapy  [facility] for tasks assessed/performed       Past Medical History:  Diagnosis Date  . Anemia   . Anxiety   . Arthritis   . Bilateral dry eyes   . Breast cancer, left breast ([place])   . Family history of breast cancer 02/02/2017  . Family history of breast cancer   . Family history of colon cancer 02/02/2017  . Family history of colon cancer   . Family history of prostate cancer   . Family history of prostate cancer in father 02/02/2017  . Family history of uterine cancer   . Fibroid   . History of kidney stones   . Hyperlipidemia   . Kidney stones   . Melanoma of upper arm ([place]) 2009   RIGHT ARM   . Obesity (BMI 30.0-34.9)   . Osteopenia 09/2017   T score -1.9 max 8% / 0.8% statistically significant decline at right and left hip stable at the spine  . PONV (postoperative nausea and vomiting)   . Squamous carcinoma     of the skin  . UTI (urinary tract infection)   . Varicose veins of both lower extremities   . Vitamin D deficiency 07/2009   LOW VITAMIN D 29  . Vitamin deficiency 07/2008   VITAMIN D LOW 23    Past Surgical History:  Procedure Laterality Date  . ABDOMINAL SURGERY  1975   ovairan cyst   . APPENDECTOMY    . BREAST LUMPECTOMY WITH RADIOACTIVE SEED LOCALIZATION Right 09/25/2017   Procedure: RIGHT BREAST LUMPECTOMY WITH  RADIOACTIVE SEED LOCALIZATION;  Surgeon: Jovita Kussmaul, MD;  Location: [place];  Service: General;  Laterality: Right;  . BREAST SURGERY     bio  . CHOLECYSTECTOMY    . DERMOID CYST  EXCISION    . DILATION AND CURETTAGE OF UTERUS  1998  . GALLBLADDER SURGERY    . HYSTEROSCOPY    . insertion  port a cath  03/02/2016  . MASTECTOMY MODIFIED RADICAL Left 09/25/2017   Procedure: LEFT MODIFIED RADICAL MASTECTOMY;  Surgeon: Jovita Kussmaul, MD;  Location: [place];  Service: General;  Laterality: Left;  . PORTACATH PLACEMENT N/A 03/02/2017   Procedure: INSERTION PORT-A-CATH;  Surgeon: Jovita Kussmaul, MD;  Location: [place];  Service: General;  Laterality: N/A;  . SKIN BIOPSY    . SKIN CANCER EXCISION  2010,2011   2010-RIGHT ARM, 2011-LEFT LOWER LEG.-DR Rolm Bookbinder DERM. DR. Sarajane Jews IS HER DERM SURGEON.    There were no vitals filed for this visit.  Subjective Assessment - 12/24/18 0940    Subjective  I tried the adductor stretch in side lunge and my knees don't support it.    Pertinent History  Lt breast cancer    Limitations  Standing;Walking  How long can you stand comfortably?  10-15 minutes    How long can you walk comfortably?  5 minutes    Diagnostic tests  none    Patient Stated Goals  begin exercising without incresing knee pain, walk    Currently in Pain?  Yes    Pain Score  5     Pain Location  Knee    Pain Orientation  Right;Left    Pain Descriptors / Indicators  Aching;Sore    Pain Type  Chronic pain    Pain Onset  More than a month ago    Pain Frequency  Constant    Aggravating Factors   standing, walking, tailbone pain laying on back    Pain Relieving Factors  sitting for knee relief                       [facility] Adult PT Treatment/Exercise - 12/24/18 0001      Exercises   Exercises  Knee/Hip      Knee/Hip Exercises: Stretches   Active Hamstring Stretch  Both;1 rep;30 seconds      Knee/Hip Exercises: Aerobic   Nustep  L1 x 6' seat 12, PT present to  discuss symptoms      Knee/Hip Exercises: Seated   Long Arc Quad  Strengthening;Both;3 sets;5 reps    Long Arc Quad Limitations  pain on Rt knee    Marching Limitations  isometric hip flexion 5x5 hold    Hamstring Curl  Strengthening;Both;2 sets;5 reps    Abduction/Adduction   Strengthening;Both;10 reps    Abd/Adduction Limitations  black band             PT Education - 12/24/18 1016    Education Details  Access Code: [email or web address]    Person(s) Educated  Patient    Methods  Explanation;Demonstration;Handout    Comprehension  Verbalized understanding;Returned demonstration       PT Short Term Goals - 12/19/18 1253      PT SHORT TERM GOAL #1   Title  be independent in initial HEP    Status  On-going      PT SHORT TERM GOAL #2   Title  initiate a walking program and verbalize understanding of how to progress slowly    Status  New        PT Long Term Goals - 12/05/18 1058      PT LONG TERM GOAL #1   Title  be independent in advanced HEP    Baseline  --    Time  8    Period  Weeks    Status  New    Target Date  01/30/19      PT LONG TERM GOAL #2   Title  reduce FOTO to < or = to 37% limitation    Baseline  --    Time  8    Period  Weeks    Status  New    Target Date  01/30/19      PT LONG TERM GOAL #3   Title  perform regular walking or gym exercise and verbalize understanding of safe progression for strength and endurance    Baseline  --    Time  8    Period  Weeks    Status  New    Target Date  01/30/19      PT LONG TERM GOAL #4   Title  report a 60% reduction in bil knee and Rt hip pain with standing  and walking to improve tolerance    Baseline  --    Time  8    Period  Weeks    Status  New    Target Date  01/30/19      PT LONG TERM GOAL #5   Title  demonstrate 4+/5 bil knee strength to improve endurance and safety on steps    Baseline  --    Time  8    Period  Weeks    Status  New    Target Date  01/30/19            Plan - 12/24/18  1016    Clinical Impression Statement  PT continues to modify HEP and ther ex during sessions to accommodate Pt pain in presence of bil knee arthritis and pain, along with history of ongoing tailbone and Lt hip pain which limits positional tolerance of supine and some standing ther ex.  Pt did well with seated and standing isometrics added to HEP today with need for mod/max cuieng to facil proper muscle recruitment.  PT updated HEP to reflect exercises that Pt can do with less pain.  Conintue along POC wiht careful monitoring of response to care.    Comorbidities  osteopenia, HTN, history of breast cancer    PT Frequency  2x / week    PT Duration  8 weeks    PT Treatment/Interventions  ADLs/Self Care Home Management;Cryotherapy;Ultrasound;Moist Heat;Electrical Stimulation;Gait training;Stair training;Balance training;Therapeutic exercise;Therapeutic activities;Functional mobility training;Neuromuscular re-education;Patient/family education;Manual techniques;Passive range of motion;Dry needling;Taping;Spinal Manipulations    PT Next Visit Plan  f/u on updated HEP, use of heat/ice at home for knees, progress to tolerance, try 2" step ups/downs, sidelying clams    PT Home Exercise Plan  Access Code: [email or web address]    Consulted and Agree with Plan of Care  Patient       Patient will benefit from skilled therapeutic intervention in order to improve the following deficits and impairments:  Abnormal gait, Decreased activity tolerance, Decreased endurance, Decreased range of motion, Decreased mobility, Decreased strength, Difficulty walking, Impaired flexibility, Postural dysfunction, Improper body mechanics  Visit Diagnosis: Abnormal posture  Chronic pain of right knee  Pain in right hip  Muscle weakness (generalized)     Problem List Patient Active Problem List   Diagnosis Date Noted  . Genetic testing 12/14/2017  . Family history of uterine cancer   . Family history of prostate cancer   .  Melanoma in situ of right upper extremity ([place]) 07/18/2017  . Port-A-Cath in place 04/18/2017  . Drug-induced neutropenia ([place]) 03/09/2017  . Family history of breast cancer 02/02/2017  . Family history of colon cancer 02/02/2017  . Family history of prostate cancer in father 02/02/2017  . Malignant neoplasm of overlapping sites of left breast in female, estrogen receptor positive ([place]) 01/31/2017  . Hx of hot flashes, menopausal, HRT in the past 12/19/2011  . Osteopenia, stable, DEXA 2013, recs to repeat in 2018 12/19/2011  . Hx of Hyperlipidemia, LDL 144 in 07/2011 12/19/2011  . Vitamin D insufficiency 12/19/2011  . Fibroid     Baruch Merl, PT 12/24/18 11:18 AM   [facility] [address], [place], [place], [address] Phone: [phone]   Fax:  [phone]  Name: CARLIN MADERO MRN: [id number] Date of Birth: [birth year]

## 2018-12-26 ENCOUNTER — Other Ambulatory Visit: Payer: Self-pay

## 2018-12-26 ENCOUNTER — Encounter: Payer: Self-pay | Admitting: Physical Therapy

## 2018-12-26 ENCOUNTER — Ambulatory Visit: Payer: Medicare Other | Admitting: Physical Therapy

## 2018-12-26 DIAGNOSIS — M25561 Pain in right knee: Secondary | ICD-10-CM

## 2018-12-26 DIAGNOSIS — R293 Abnormal posture: Secondary | ICD-10-CM

## 2018-12-26 DIAGNOSIS — G8929 Other chronic pain: Secondary | ICD-10-CM

## 2018-12-26 DIAGNOSIS — M6281 Muscle weakness (generalized): Secondary | ICD-10-CM

## 2018-12-26 DIAGNOSIS — M25551 Pain in right hip: Secondary | ICD-10-CM

## 2018-12-26 NOTE — Patient Instructions (Signed)
Access Code: [email or web address]  URL: [email or web address]  Date: 12/26/2018  Prepared by: Venetia Night Payten Hobin   Exercises  Supine Heel Slides - 10 reps - 2 sets - 1x daily - 7x weekly  Hooklying Single Knee to Chest - 3 reps - 1 sets - 20 hold - 3x daily - 7x weekly  Seated Hamstring Stretch - 3 reps - 1 sets - 20 hold - 3x daily - 7x weekly  Seated Long Arc Quad - 10 reps - 1 sets - 3x daily - 7x weekly  Seated Hamstring Curls with Resistance - 10 reps - 3 sets - 1x daily - 7x weekly  Heel rises with counter support - 20 reps - 2 sets - 1x daily - 7x weekly  Standing Hip Extension with Counter Support - 10 reps - 1 sets - 1x daily - 7x weekly  Squat with Counter Support - 10 reps - 1 sets - 1x daily - 7x weekly  Sit to Stand with Counter Support - 5 reps - 1 sets - 1x daily - 7x weekly  Standing Knee Flexion with Counter Support - 10 reps - 1 sets - 1x daily - 7x weekly  Seated Hip Abduction with Resistance - 10 reps - 2 sets - 1x daily - 7x weekly  Isometric Gluteus Medius at Wall - 5 reps - 1 sets - 5 hold - 1x daily - 7x weekly  Hooklying Isometric Hip Flexion - 5 reps - 1 sets - 5 hold - 1x daily - 7x weekly  Clamshell - 10 reps - 1 sets - 1x daily - 7x weekly

## 2018-12-26 NOTE — Therapy (Signed)
[facility] [address] [place], [place], [address] Phone: [phone]   Fax:  [phone]  Physical Therapy Treatment  Patient Details  Name: Brooke [name] MRN: [id number] Date of Birth: Oct 26, 1950 Referring Provider (PT): Micheline Rough, MD   Encounter Date: 12/26/2018  PT End of Session - 12/26/18 0931    Visit Number  5    Date for PT Re-Evaluation  01/30/19    Authorization Type  [employer]HC Medicare    PT Start Time  0931    PT Stop Time  1016    PT Time Calculation (min)  45 min    Activity Tolerance  Patient tolerated treatment well    Behavior During Therapy  [facility] for tasks assessed/performed       Past Medical History:  Diagnosis Date  . Anemia   . Anxiety   . Arthritis   . Bilateral dry eyes   . Breast cancer, left breast ([place])   . Family history of breast cancer 02/02/2017  . Family history of breast cancer   . Family history of colon cancer 02/02/2017  . Family history of colon cancer   . Family history of prostate cancer   . Family history of prostate cancer in father 02/02/2017  . Family history of uterine cancer   . Fibroid   . History of kidney stones   . Hyperlipidemia   . Kidney stones   . Melanoma of upper arm ([place]) 2009   RIGHT ARM   . Obesity (BMI 30.0-34.9)   . Osteopenia 09/2017   T score -1.9 max 8% / 0.8% statistically significant decline at right and left hip stable at the spine  . PONV (postoperative nausea and vomiting)   . Squamous carcinoma     of the skin  . UTI (urinary tract infection)   . Varicose veins of both lower extremities   . Vitamin D deficiency 07/2009   LOW VITAMIN D 29  . Vitamin deficiency 07/2008   VITAMIN D LOW 23    Past Surgical History:  Procedure Laterality Date  . ABDOMINAL SURGERY  1975   ovairan cyst   . APPENDECTOMY    . BREAST LUMPECTOMY WITH RADIOACTIVE SEED LOCALIZATION Right 09/25/2017   Procedure: RIGHT BREAST LUMPECTOMY WITH  RADIOACTIVE SEED LOCALIZATION;  Surgeon: Jovita Kussmaul, MD;  Location: [place];  Service: General;  Laterality: Right;  . BREAST SURGERY     bio  . CHOLECYSTECTOMY    . DERMOID CYST  EXCISION    . DILATION AND CURETTAGE OF UTERUS  1998  . GALLBLADDER SURGERY    . HYSTEROSCOPY    . insertion  port a cath  03/02/2016  . MASTECTOMY MODIFIED RADICAL Left 09/25/2017   Procedure: LEFT MODIFIED RADICAL MASTECTOMY;  Surgeon: Jovita Kussmaul, MD;  Location: [place];  Service: General;  Laterality: Left;  . PORTACATH PLACEMENT N/A 03/02/2017   Procedure: INSERTION PORT-A-CATH;  Surgeon: Jovita Kussmaul, MD;  Location: [place];  Service: General;  Laterality: N/A;  . SKIN BIOPSY    . SKIN CANCER EXCISION  2010,2011   2010-RIGHT ARM, 2011-LEFT LOWER LEG.-DR Rolm Bookbinder DERM. DR. Sarajane Jews IS HER DERM SURGEON.    There were no vitals filed for this visit.  Subjective Assessment - 12/26/18 0935    Subjective  Rt knee is bothering me a lot this morning.    Pertinent History  Lt breast cancer    Limitations  Standing;Walking    How long can  you stand comfortably?  10-15 minutes    How long can you walk comfortably?  5 minutes    Diagnostic tests  none    Patient Stated Goals  begin exercising without incresing knee pain, walk    Currently in Pain?  Yes    Pain Score  5    up to 7/10 with movement   Pain Location  Knee    Pain Orientation  Right;Left    Pain Descriptors / Indicators  Aching;Sore    Pain Type  Chronic pain    Pain Onset  More than a month ago    Pain Frequency  Constant    Aggravating Factors   standing, walking, using knee, tailbone pain laying on back    Pain Relieving Factors  sitting for knee relief                       [facility] Adult PT Treatment/Exercise - 12/26/18 0001      Exercises   Exercises  Knee/Hip      Lumbar Exercises: Sidelying   Clam  10 reps;Both    Clam Limitations  with pillow between knees to modify range to prevent pinching in Lt groin       Knee/Hip Exercises: Aerobic   Nustep  L1 seat 12 x 6', PT present to discuss progress      Knee/Hip Exercises: Machines for Strengthening   Cybex Leg Press  bil LEs 40# x 15 reps,   some tailbone pain     Knee/Hip Exercises: Standing   Heel Raises  Both;10 reps    Knee Flexion  Strengthening;Both;10 reps    Knee Flexion Limitations  2# bil UE support    Hip Extension  Stengthening;Both;10 reps;Knee bent    Functional Squat  5 reps    Functional Squat Limitations  small range at counter      Knee/Hip Exercises: Seated   Sit to Sand  3 sets;with UE support   from black pad and use of bil armrests     Knee/Hip Exercises: Sidelying   Clams  10 reps with pillow between knees             PT Education - 12/26/18 1015    Education Details  Access Code: [email or web address]    Person(s) Educated  Patient    Methods  Explanation;Demonstration;Handout    Comprehension  Verbalized understanding;Returned demonstration       PT Short Term Goals - 12/19/18 1253      PT SHORT TERM GOAL #1   Title  be independent in initial HEP    Status  On-going      PT SHORT TERM GOAL #2   Title  initiate a walking program and verbalize understanding of how to progress slowly    Status  New        PT Long Term Goals - 12/05/18 1058      PT LONG TERM GOAL #1   Title  be independent in advanced HEP    Baseline  --    Time  8    Period  Weeks    Status  New    Target Date  01/30/19      PT LONG TERM GOAL #2   Title  reduce FOTO to < or = to 37% limitation    Baseline  --    Time  8    Period  Weeks    Status  New    Target Date  01/30/19      PT LONG TERM GOAL #3   Title  perform regular walking or gym exercise and verbalize understanding of safe progression for strength and endurance    Baseline  --    Time  8    Period  Weeks    Status  New    Target Date  01/30/19      PT LONG TERM GOAL #4   Title  report a 60% reduction in bil knee and Rt hip pain with standing and walking to  improve tolerance    Baseline  --    Time  8    Period  Weeks    Status  New    Target Date  01/30/19      PT LONG TERM GOAL #5   Title  demonstrate 4+/5 bil knee strength to improve endurance and safety on steps    Baseline  --    Time  8    Period  Weeks    Status  New    Target Date  01/30/19            Plan - 12/26/18 1058    Clinical Impression Statement  Pt continues to have Rt>Lt knee pain with ther ex but wants to push on to see if strengthening helps her.  She is increasing her awareness of how to fire her gluteals.  PT progressed her HEP and introduced functional squats and sit to stand from black pad in armchair with bil UE assist.  This is very difficult for her due to knee pain > weakness.  End range knee extension continues to be limited and painful with sharp medial knee pain on Rt.  Attempted TKEs and d/c'd due to pain.  Continue current POC with careful monitoring of exercise tolerance and pain limits.    Comorbidities  osteopenia, HTN, history of breast cancer    Rehab Potential  Good    PT Frequency  2x / week    PT Duration  8 weeks    PT Treatment/Interventions  ADLs/Self Care Home Management;Cryotherapy;Ultrasound;Moist Heat;Electrical Stimulation;Gait training;Stair training;Balance training;Therapeutic exercise;Therapeutic activities;Functional mobility training;Neuromuscular re-education;Patient/family education;Manual techniques;Passive range of motion;Dry needling;Taping;Spinal Manipulations    PT Next Visit Plan  f/u on updated HEP, use of heat/ice at home for knees, progress to tolerance, try 2" step ups/downs, sidelying clams    PT Home Exercise Plan  Access Code: [email or web address]    Consulted and Agree with Plan of Care  Patient       Patient will benefit from skilled therapeutic intervention in order to improve the following deficits and impairments:     Visit Diagnosis: Abnormal posture  Chronic pain of right knee  Pain in right hip  Muscle  weakness (generalized)     Problem List Patient Active Problem List   Diagnosis Date Noted  . Genetic testing 12/14/2017  . Family history of uterine cancer   . Family history of prostate cancer   . Melanoma in situ of right upper extremity ([place]) 07/18/2017  . Port-A-Cath in place 04/18/2017  . Drug-induced neutropenia ([place]) 03/09/2017  . Family history of breast cancer 02/02/2017  . Family history of colon cancer 02/02/2017  . Family history of prostate cancer in father 02/02/2017  . Malignant neoplasm of overlapping sites of left breast in female, estrogen receptor positive ([place]) 01/31/2017  . Hx of hot flashes, menopausal, HRT in the past 12/19/2011  . Osteopenia, stable, DEXA 2013, recs to repeat in 2018 12/19/2011  . Hx  of Hyperlipidemia, LDL 144 in 07/2011 12/19/2011  . Vitamin D insufficiency 12/19/2011  . Fibroid     Baruch Merl, PT 12/26/18 11:03 AM   [facility] [address], [place], [place], [address] Phone: [phone]   Fax:  [phone]  Name: TIRSA SABIR MRN: [id number] Date of Birth: 05-01-1950

## 2018-12-30 DIAGNOSIS — R87610 Atypical squamous cells of undetermined significance on cytologic smear of cervix (ASC-US): Secondary | ICD-10-CM

## 2018-12-30 HISTORY — DX: Atypical squamous cells of undetermined significance on cytologic smear of cervix (ASC-US): R87.610

## 2018-12-31 ENCOUNTER — Other Ambulatory Visit: Payer: Self-pay

## 2018-12-31 ENCOUNTER — Ambulatory Visit: Payer: Medicare Other | Attending: Family Medicine | Admitting: Physical Therapy

## 2018-12-31 ENCOUNTER — Encounter: Payer: Self-pay | Admitting: Physical Therapy

## 2018-12-31 DIAGNOSIS — M6281 Muscle weakness (generalized): Secondary | ICD-10-CM

## 2018-12-31 DIAGNOSIS — M25561 Pain in right knee: Secondary | ICD-10-CM | POA: Diagnosis present

## 2018-12-31 DIAGNOSIS — G8929 Other chronic pain: Secondary | ICD-10-CM | POA: Diagnosis present

## 2018-12-31 DIAGNOSIS — M25551 Pain in right hip: Secondary | ICD-10-CM

## 2018-12-31 DIAGNOSIS — R293 Abnormal posture: Secondary | ICD-10-CM | POA: Diagnosis not present

## 2018-12-31 NOTE — Therapy (Signed)
[facility] [address], [place], [place], [address] Phone: [phone]   Fax:  [phone]  Physical Therapy Treatment  Patient Details  Name: Brooke [name] MRN: [id number] Date of Birth: [birth year] Referring Provider (PT): Micheline Rough, MD   Encounter Date: 12/31/2018  PT End of Session - 12/31/18 1014    Visit Number  6    Date for PT Re-Evaluation  01/30/19    Authorization Type  UHC Medicare    PT Start Time  0930    PT Stop Time  1015    PT Time Calculation (min)  45 min    Activity Tolerance  Patient tolerated treatment well    Behavior During Therapy  [facility] for tasks assessed/performed       Past Medical History:  Diagnosis Date  . Anemia   . Anxiety   . Arthritis   . Bilateral dry eyes   . Breast cancer, left breast ([place])   . Family history of breast cancer 02/02/2017  . Family history of breast cancer   . Family history of colon cancer 02/02/2017  . Family history of colon cancer   . Family history of prostate cancer   . Family history of prostate cancer in father 02/02/2017  . Family history of uterine cancer   . Fibroid   . History of kidney stones   . Hyperlipidemia   . Kidney stones   . Melanoma of upper arm ([place]) 2009   RIGHT ARM   . Obesity (BMI 30.0-34.9)   . Osteopenia 09/2017   T score -1.9 max 8% / 0.8% statistically significant decline at right and left hip stable at the spine  . PONV (postoperative nausea and vomiting)   . Squamous carcinoma     of the skin  . UTI (urinary tract infection)   . Varicose veins of both lower extremities   . Vitamin D deficiency 07/2009   LOW VITAMIN D 29  . Vitamin deficiency 07/2008   VITAMIN D LOW 23    Past Surgical History:  Procedure Laterality Date  . ABDOMINAL SURGERY  1975   ovairan cyst   . APPENDECTOMY    . BREAST LUMPECTOMY WITH RADIOACTIVE SEED LOCALIZATION Right 09/25/2017   Procedure: RIGHT BREAST LUMPECTOMY WITH RADIOACTIVE  SEED LOCALIZATION;  Surgeon: Jovita Kussmaul, MD;  Location: [place];  Service: General;  Laterality: Right;  . BREAST SURGERY     bio  . CHOLECYSTECTOMY    . DERMOID CYST  EXCISION    . DILATION AND CURETTAGE OF UTERUS  1998  . GALLBLADDER SURGERY    . HYSTEROSCOPY    . insertion  port a cath  03/02/2016  . MASTECTOMY MODIFIED RADICAL Left 09/25/2017   Procedure: LEFT MODIFIED RADICAL MASTECTOMY;  Surgeon: Jovita Kussmaul, MD;  Location: [place];  Service: General;  Laterality: Left;  . PORTACATH PLACEMENT N/A 03/02/2017   Procedure: INSERTION PORT-A-CATH;  Surgeon: Jovita Kussmaul, MD;  Location: [place];  Service: General;  Laterality: N/A;  . SKIN BIOPSY    . SKIN CANCER EXCISION  2010,2011   2010-RIGHT ARM, 2011-LEFT LOWER LEG.-DR Rolm Bookbinder DERM. DR. Sarajane Jews IS HER DERM SURGEON.    There were no vitals filed for this visit.  Subjective Assessment - 12/31/18 0935    Subjective  I am starting to get concerned about the pain in my knees and finding how much pain there is in them.    Pertinent History  Lt breast cancer  Limitations  Standing;Walking    How long can you stand comfortably?  10-15 minutes    How long can you walk comfortably?  5 minutes    Diagnostic tests  none    Patient Stated Goals  begin exercising without incresing knee pain, walk    Currently in Pain?  Yes    Pain Score  7     Pain Location  Knee    Pain Orientation  Right;Left    Pain Descriptors / Indicators  Aching;Sore    Pain Type  Chronic pain    Pain Onset  More than a month ago    Pain Frequency  Constant    Aggravating Factors   standing, walking    Pain Relieving Factors  sitting                       [facility] Adult PT Treatment/Exercise - 12/31/18 0001      Lumbar Exercises: Aerobic   Nustep  L1 x 6', PT present to monitor, Pt slow due to pain in bil knees      Knee/Hip Exercises: Standing   Heel Raises  Both;20 reps    Hip Flexion  Stengthening;Both;AROM;10 reps;2 sets;Knee  straight    Abduction Limitations  sidestepping with red band around thighs sidestepping along counter to fatigue (approx 90 sec), 10 reps abduction with red band resistance after sidestepping    Hip Extension  Stengthening;10 reps;Both;Knee straight;2 sets      Knee/Hip Exercises: Supine   Short Arc Quad Sets  Strengthening;Both;2 sets;5 reps    Short Arc Quad Sets Limitations  2# first set, no weight second set due to pain    Heel Slides Limitations  feet on red ball DKTC x 15    Straight Leg Raises  Both;5 reps    Straight Leg Raises Limitations  from bolster, pain    Other Supine Knee/Hip Exercises  hamstring isometric bil feet on ball hips/knees 90/90 10x5 sec    Other Supine Knee/Hip Exercises  hooklying march alt bil 2# ankle weight x 10 each             PT Education - 12/31/18 1014    Education Details  Access Code: [email or web address]    Person(s) Educated  Patient    Methods  Explanation;Demonstration;Handout    Comprehension  Verbalized understanding;Returned demonstration       PT Short Term Goals - 12/19/18 1253      PT SHORT TERM GOAL #1   Title  be independent in initial HEP    Status  On-going      PT SHORT TERM GOAL #2   Title  initiate a walking program and verbalize understanding of how to progress slowly    Status  New        PT Long Term Goals - 12/05/18 1058      PT LONG TERM GOAL #1   Title  be independent in advanced HEP    Baseline  --    Time  8    Period  Weeks    Status  New    Target Date  01/30/19      PT LONG TERM GOAL #2   Title  reduce FOTO to < or = to 37% limitation    Baseline  --    Time  8    Period  Weeks    Status  New    Target Date  01/30/19      PT LONG  TERM GOAL #3   Title  perform regular walking or gym exercise and verbalize understanding of safe progression for strength and endurance    Baseline  --    Time  8    Period  Weeks    Status  New    Target Date  01/30/19      PT LONG TERM GOAL #4   Title  report a 60%  reduction in bil knee and Rt hip pain with standing and walking to improve tolerance    Baseline  --    Time  8    Period  Weeks    Status  New    Target Date  01/30/19      PT LONG TERM GOAL #5   Title  demonstrate 4+/5 bil knee strength to improve endurance and safety on steps    Baseline  --    Time  8    Period  Weeks    Status  New    Target Date  01/30/19            Plan - 12/31/18 1014    Clinical Impression Statement  Pt with 7/10 bil knee pain on arrival and concerns about level of pain.  PT encouraged her to contact MD since she hasn't been seen specifically for her knees in years.  She has limited extension bil and signif crepitus.  PT has been modifying around Lt anterior hip pain which seemed better today so PT was able to guide Pt through more hip standing ther ex minimizing exercises thta repeatedly bend/straightened the knees since she was so sore today.  Pain reduced to 5/10 end of session suggesting improved tolerance and benefit of ther ex today.  Pt will continue to benefit from careful progression surrounding chronic pain.  PT updated HEP today for hip strength at counter.    Comorbidities  osteopenia, HTN, history of breast cancer    Rehab Potential  Good    PT Frequency  2x / week    PT Duration  8 weeks    PT Treatment/Interventions  ADLs/Self Care Home Management;Cryotherapy;Ultrasound;Moist Heat;Electrical Stimulation;Gait training;Stair training;Balance training;Therapeutic exercise;Therapeutic activities;Functional mobility training;Neuromuscular re-education;Patient/family education;Manual techniques;Passive range of motion;Dry needling;Taping;Spinal Manipulations    PT Next Visit Plan  f/u on updated HEP, continue blend of standing and supine (if supine place pillow for tailbone pain) ther ex with minimizing knee flexion/ext reps, focus on hips, monitor pain    PT Home Exercise Plan  Access Code: [email or web address]    Consulted and Agree with Plan of Care  Patient        Patient will benefit from skilled therapeutic intervention in order to improve the following deficits and impairments:  Abnormal gait, Decreased activity tolerance, Decreased endurance, Decreased range of motion, Decreased mobility, Decreased strength, Difficulty walking, Impaired flexibility, Postural dysfunction, Improper body mechanics  Visit Diagnosis: Abnormal posture  Chronic pain of right knee  Pain in right hip  Muscle weakness (generalized)     Problem List Patient Active Problem List   Diagnosis Date Noted  . Genetic testing 12/14/2017  . Family history of uterine cancer   . Family history of prostate cancer   . Melanoma in situ of right upper extremity ([place]) 07/18/2017  . Port-A-Cath in place 04/18/2017  . Drug-induced neutropenia ([place]) 03/09/2017  . Family history of breast cancer 02/02/2017  . Family history of colon cancer 02/02/2017  . Family history of prostate cancer in father 02/02/2017  . Malignant neoplasm of overlapping sites of  left breast in female, estrogen receptor positive ([place]) 01/31/2017  . Hx of hot flashes, menopausal, HRT in the past 12/19/2011  . Osteopenia, stable, DEXA 2013, recs to repeat in 2018 12/19/2011  . Hx of Hyperlipidemia, LDL 144 in 07/2011 12/19/2011  . Vitamin D insufficiency 12/19/2011  . Fibroid     Baruch Merl, PT 12/31/18 10:19 AM   [facility] [address], [place], [place], [address] Phone: [phone]   Fax:  [phone]  Name: Brooke [name] MRN: [id number] Date of Birth: [birth year]

## 2018-12-31 NOTE — Patient Instructions (Signed)
Access Code: [email or web address]  URL: [email or web address]  Date: 12/31/2018  Prepared by: Venetia Night Beuhring   Exercises  Supine Heel Slides - 10 reps - 2 sets - 1x daily - 7x weekly  Hooklying Single Knee to Chest - 3 reps - 1 sets - 20 hold - 3x daily - 7x weekly  Seated Hamstring Stretch - 3 reps - 1 sets - 20 hold - 3x daily - 7x weekly  Seated Long Arc Quad - 10 reps - 1 sets - 3x daily - 7x weekly  Seated Hamstring Curls with Resistance - 10 reps - 3 sets - 1x daily - 7x weekly  Heel rises with counter support - 20 reps - 2 sets - 1x daily - 7x weekly  Standing Hip Flexion with Counter Support - 10 reps - 3 sets - 1x daily - 7x weekly  Standing Hip Abduction with Counter Support - 10 reps - 3 sets - 1x daily - 7x weekly  Standing Hip Extension with Counter Support - 10 reps - 3 sets - 1x daily - 7x weekly  Squat with Counter Support - 10 reps - 1 sets - 1x daily - 7x weekly  Sit to Stand with Counter Support - 5 reps - 1 sets - 1x daily - 7x weekly  Standing Knee Flexion with Counter Support - 10 reps - 1 sets - 1x daily - 7x weekly  Seated Hip Abduction with Resistance - 10 reps - 2 sets - 1x daily - 7x weekly  Hooklying Isometric Hip Flexion - 5 reps - 1 sets - 5 hold - 1x daily - 7x weekly  Clamshell - 10 reps - 1 sets - 1x daily - 7x weekly

## 2019-01-02 ENCOUNTER — Other Ambulatory Visit: Payer: Self-pay

## 2019-01-02 ENCOUNTER — Ambulatory Visit: Payer: Medicare Other | Admitting: Physical Therapy

## 2019-01-02 ENCOUNTER — Encounter: Payer: Self-pay | Admitting: Physical Therapy

## 2019-01-02 DIAGNOSIS — G8929 Other chronic pain: Secondary | ICD-10-CM

## 2019-01-02 DIAGNOSIS — R293 Abnormal posture: Secondary | ICD-10-CM

## 2019-01-02 NOTE — Therapy (Signed)
[facility] [address], [place], [place], [address] Phone: [phone]   Fax:  [phone]  Physical Therapy Treatment  Patient Details  Name: Brooke [name] MRN: [id number] Date of Birth: [birth year] Referring Provider (PT): Micheline Rough, MD   Encounter Date: 01/02/2019  PT End of Session - 01/02/19 1009    Visit Number  7    Date for PT Re-Evaluation  01/30/19    Authorization Type  [employer]HC Medicare    PT Start Time  0934    PT Stop Time  1012    PT Time Calculation (min)  38 min    Activity Tolerance  Patient tolerated treatment well;No increased pain    Behavior During Therapy  WFL for tasks assessed/performed       Past Medical History:  Diagnosis Date  . Anemia   . Anxiety   . Arthritis   . Bilateral dry eyes   . Breast cancer, left breast ([place])   . Family history of breast cancer 02/02/2017  . Family history of breast cancer   . Family history of colon cancer 02/02/2017  . Family history of colon cancer   . Family history of prostate cancer   . Family history of prostate cancer in father 02/02/2017  . Family history of uterine cancer   . Fibroid   . History of kidney stones   . Hyperlipidemia   . Kidney stones   . Melanoma of upper arm ([place]) 2009   RIGHT ARM   . Obesity (BMI 30.0-34.9)   . Osteopenia 09/2017   T score -1.9 max 8% / 0.8% statistically significant decline at right and left hip stable at the spine  . PONV (postoperative nausea and vomiting)   . Squamous carcinoma     of the skin  . UTI (urinary tract infection)   . Varicose veins of both lower extremities   . Vitamin D deficiency 07/2009   LOW VITAMIN D 29  . Vitamin deficiency 07/2008   VITAMIN D LOW 23    Past Surgical History:  Procedure Laterality Date  . ABDOMINAL SURGERY  1975   ovairan cyst   . APPENDECTOMY    . BREAST LUMPECTOMY WITH RADIOACTIVE SEED LOCALIZATION Right 09/25/2017   Procedure: RIGHT BREAST  LUMPECTOMY WITH RADIOACTIVE SEED LOCALIZATION;  Surgeon: Jovita Kussmaul, MD;  Location: [place];  Service: General;  Laterality: Right;  . BREAST SURGERY     bio  . CHOLECYSTECTOMY    . DERMOID CYST  EXCISION    . DILATION AND CURETTAGE OF UTERUS  1998  . GALLBLADDER SURGERY    . HYSTEROSCOPY    . insertion  port a cath  03/02/2016  . MASTECTOMY MODIFIED RADICAL Left 09/25/2017   Procedure: LEFT MODIFIED RADICAL MASTECTOMY;  Surgeon: Jovita Kussmaul, MD;  Location: [place];  Service: General;  Laterality: Left;  . PORTACATH PLACEMENT N/A 03/02/2017   Procedure: INSERTION PORT-A-CATH;  Surgeon: Jovita Kussmaul, MD;  Location: [facility];  Service: General;  Laterality: N/A;  . SKIN BIOPSY    . SKIN CANCER EXCISION  2010,2011   2010-RIGHT ARM, 2011-LEFT LOWER LEG.-DR Rolm Bookbinder DERM. DR. Sarajane Jews IS HER DERM SURGEON.    There were no vitals filed for this visit.  Subjective Assessment - 01/02/19 0937    Subjective  Pt states that she felt better after her last session. She was not pain free, but it seemed not to make anything worse. She does have pain currently  but this is normal.    Pertinent History  Lt breast cancer    Limitations  Standing;Walking    How long can you stand comfortably?  10-15 minutes    How long can you walk comfortably?  5 minutes    Diagnostic tests  none    Patient Stated Goals  begin exercising without incresing knee pain, walk    Currently in Pain?  Yes    Pain Score  7     Pain Location  Knee    Pain Orientation  Right;Left    Pain Descriptors / Indicators  Aching    Pain Type  Chronic pain    Pain Radiating Towards  none    Pain Onset  More than a month ago    Pain Frequency  Constant                       [facility] Adult PT Treatment/Exercise - 01/02/19 0001      Knee/Hip Exercises: Aerobic   Nustep  L1 seat 11, x6 min Pt present to discuss last session and current session plan       Knee/Hip Exercises: Standing   Heel Raises  Both;20 reps     Abduction Limitations  2x10 reps alternating sidestep with red TB around thighs    Hip Extension  Stengthening;10 reps;Both;Knee straight;2 sets      Knee/Hip Exercises: Supine   Short Arc Quad Sets  Both;1 set;10 reps    Heel Slides Limitations  BLE on red physioball DKTC x15 reps     Other Supine Knee/Hip Exercises  clamshell with green TB     Other Supine Knee/Hip Exercises  90/90 hamstring curl isometric 10x5 sec hold              PT Education - 01/02/19 1032    Education Details  technique with therex    Person(s) Educated  Patient    Methods  Explanation;Verbal cues    Comprehension  Verbalized understanding;Returned demonstration       PT Short Term Goals - 12/19/18 1253      PT SHORT TERM GOAL #1   Title  be independent in initial HEP    Status  On-going      PT SHORT TERM GOAL #2   Title  initiate a walking program and verbalize understanding of how to progress slowly    Status  New        PT Long Term Goals - 12/05/18 1058      PT LONG TERM GOAL #1   Title  be independent in advanced HEP    Baseline  --    Time  8    Period  Weeks    Status  New    Target Date  01/30/19      PT LONG TERM GOAL #2   Title  reduce FOTO to < or = to 37% limitation    Baseline  --    Time  8    Period  Weeks    Status  New    Target Date  01/30/19      PT LONG TERM GOAL #3   Title  perform regular walking or gym exercise and verbalize understanding of safe progression for strength and endurance    Baseline  --    Time  8    Period  Weeks    Status  New    Target Date  01/30/19      PT  LONG TERM GOAL #4   Title  report a 60% reduction in bil knee and Rt hip pain with standing and walking to improve tolerance    Baseline  --    Time  8    Period  Weeks    Status  New    Target Date  01/30/19      PT LONG TERM GOAL #5   Title  demonstrate 4+/5 bil knee strength to improve endurance and safety on steps    Baseline  --    Time  8    Period  Weeks    Status   New    Target Date  01/30/19            Plan - 01/02/19 1013    Clinical Impression Statement  Pt felt that her knee pain was improved following her last session and was pleased that she didn't have more pain with activity. Today's session there were minimal changes in exercises in order to focus on consistent strengthening and ROM without exacerbation of her pain. Pt was able to complete more repetitions of her short arc quad without knee pain and she noted proper quadriceps fatigue. Pt had Lt adductor/groin pain reported with end range Lt hip abduction during clamshell however this was decreased with therapist adjustments in ROM during this exercise. Ended session without reports of increased knee pain.    Comorbidities  osteopenia, HTN, history of breast cancer    Rehab Potential  Good    PT Frequency  2x / week    PT Duration  8 weeks    PT Treatment/Interventions  ADLs/Self Care Home Management;Cryotherapy;Ultrasound;Moist Heat;Electrical Stimulation;Gait training;Stair training;Balance training;Therapeutic exercise;Therapeutic activities;Functional mobility training;Neuromuscular re-education;Patient/family education;Manual techniques;Passive range of motion;Dry needling;Taping;Spinal Manipulations    PT Next Visit Plan  possible aquatics if evaluating/primary PT feels it would be beneficial; continue blend of standing and supine (if supine place pillow for tailbone pain) ther ex with minimizing knee flexion/ext reps, focus on hips, monitor pain    PT Home Exercise Plan  Access Code: [email or web address]    Consulted and Agree with Plan of Care  Patient       Patient will benefit from skilled therapeutic intervention in order to improve the following deficits and impairments:  Abnormal gait, Decreased activity tolerance, Decreased endurance, Decreased range of motion, Decreased mobility, Decreased strength, Difficulty walking, Impaired flexibility, Postural dysfunction, Improper body  mechanics  Visit Diagnosis: Abnormal posture  Chronic pain of right knee     Problem List Patient Active Problem List   Diagnosis Date Noted  . Genetic testing 12/14/2017  . Family history of uterine cancer   . Family history of prostate cancer   . Melanoma in situ of right upper extremity ([place]) 07/18/2017  . Port-A-Cath in place 04/18/2017  . Drug-induced neutropenia ([place]) 03/09/2017  . Family history of breast cancer 02/02/2017  . Family history of colon cancer 02/02/2017  . Family history of prostate cancer in father 02/02/2017  . Malignant neoplasm of overlapping sites of left breast in female, estrogen receptor positive ([place]) 01/31/2017  . Hx of hot flashes, menopausal, HRT in the past 12/19/2011  . Osteopenia, stable, DEXA 2013, recs to repeat in 2018 12/19/2011  . Hx of Hyperlipidemia, LDL 144 in 07/2011 12/19/2011  . Vitamin D insufficiency 12/19/2011  . Fibroid     10:33 AM,01/02/19 Sherol Dade PT, DPT [facility] [address],  [place], [place], [address] Phone: [phone]   Fax:  [phone]  Name: Brooke [name] MRN: [id number] Date of Birth: 28-Dec-1950

## 2019-01-07 ENCOUNTER — Other Ambulatory Visit: Payer: Self-pay

## 2019-01-07 ENCOUNTER — Ambulatory Visit: Payer: Medicare Other

## 2019-01-07 DIAGNOSIS — R293 Abnormal posture: Secondary | ICD-10-CM

## 2019-01-07 DIAGNOSIS — G8929 Other chronic pain: Secondary | ICD-10-CM

## 2019-01-07 DIAGNOSIS — M6281 Muscle weakness (generalized): Secondary | ICD-10-CM

## 2019-01-07 NOTE — Therapy (Signed)
[facility] [address], [place], [place], [address] Phone: [phone]   Fax:  [phone]  Physical Therapy Treatment  Patient Details  Name: Brooke [name] MRN: [id number] Date of Birth: 1950/07/27 Referring Provider (PT): Micheline Rough, MD   Encounter Date: 01/07/2019  PT End of Session - 01/07/19 1012    Visit Number  8    Date for PT Re-Evaluation  01/30/19    Authorization Type  UHC Medicare    PT Start Time  1013    PT Stop Time  1102    PT Time Calculation (min)  49 min    Activity Tolerance  Patient tolerated treatment well;No increased pain    Behavior During Therapy  WFL for tasks assessed/performed       Past Medical History:  Diagnosis Date  . Anemia   . Anxiety   . Arthritis   . Bilateral dry eyes   . Breast cancer, left breast ([place])   . Family history of breast cancer 02/02/2017  . Family history of breast cancer   . Family history of colon cancer 02/02/2017  . Family history of colon cancer   . Family history of prostate cancer   . Family history of prostate cancer in father 02/02/2017  . Family history of uterine cancer   . Fibroid   . History of kidney stones   . Hyperlipidemia   . Kidney stones   . Melanoma of upper arm ([place]) 2009   RIGHT ARM   . Obesity (BMI 30.0-34.9)   . Osteopenia 09/2017   T score -1.9 max 8% / 0.8% statistically significant decline at right and left hip stable at the spine  . PONV (postoperative nausea and vomiting)   . Squamous carcinoma     of the skin  . UTI (urinary tract infection)   . Varicose veins of both lower extremities   . Vitamin D deficiency 07/2009   LOW VITAMIN D 29  . Vitamin deficiency 07/2008   VITAMIN D LOW 23    Past Surgical History:  Procedure Laterality Date  . ABDOMINAL SURGERY  1975   ovairan cyst   . APPENDECTOMY    . BREAST LUMPECTOMY WITH RADIOACTIVE SEED LOCALIZATION Right 09/25/2017   Procedure: RIGHT BREAST  LUMPECTOMY WITH RADIOACTIVE SEED LOCALIZATION;  Surgeon: Jovita Kussmaul, MD;  Location: [facility];  Service: General;  Laterality: Right;  . BREAST SURGERY     bio  . CHOLECYSTECTOMY    . DERMOID CYST  EXCISION    . DILATION AND CURETTAGE OF UTERUS  1998  . GALLBLADDER SURGERY    . HYSTEROSCOPY    . insertion  port a cath  03/02/2016  . MASTECTOMY MODIFIED RADICAL Left 09/25/2017   Procedure: LEFT MODIFIED RADICAL MASTECTOMY;  Surgeon: Jovita Kussmaul, MD;  Location: [facility];  Service: General;  Laterality: Left;  . PORTACATH PLACEMENT N/A 03/02/2017   Procedure: INSERTION PORT-A-CATH;  Surgeon: Jovita Kussmaul, MD;  Location: [place];  Service: General;  Laterality: N/A;  . SKIN BIOPSY    . SKIN CANCER EXCISION  2010,2011   2010-RIGHT ARM, 2011-LEFT LOWER LEG.-DR Rolm Bookbinder DERM. DR. Sarajane Jews IS HER DERM SURGEON.    There were no vitals filed for this visit.  Subjective Assessment - 01/07/19 1013    Subjective  Pt reports that she has been performing her exercises at home.    Pertinent History  Lt breast cancer    Limitations  Standing;Walking  How long can you stand comfortably?  10-15 minutes    How long can you walk comfortably?  5 minutes    Diagnostic tests  none    Patient Stated Goals  begin exercising without incresing knee pain, walk    Currently in Pain?  Yes    Pain Score  4     Pain Location  Knee    Pain Orientation  Right;Left    Pain Descriptors / Indicators  Aching    Pain Type  Chronic pain    Pain Onset  More than a month ago    Pain Frequency  Constant    Aggravating Factors   standing, walking    Pain Relieving Factors  sitting                       [facility] Adult PT Treatment/Exercise - 01/07/19 0001      Knee/Hip Exercises: Standing   Heel Raises  Both;20 reps    Heel Raises Limitations  VC for glute squeeze and slow lower    Hip Abduction  AROM;2 sets;Knee straight;10 reps;Left;Right    Abduction Limitations  Pt reports L groin pain on the R  with L hip abdct no pain when performed on the R. Pt is pin point painful at the L groin at the tendon of the adductor muscle. Decreased with decreased ROM following cueing.       Knee/Hip Exercises: Supine   Other Supine Knee/Hip Exercises  Supine isometric hip abdct, addct 10x 10 seconds with ball and belt for self stabilization at the pelvis due to findings of inferior placement of L ASIS compared to R that improved significantly with shot gun muscle energy technique.       Manual Therapy   Manual Therapy  Joint mobilization;Soft tissue mobilization;Muscle Energy Technique;Passive ROM    Joint Mobilization  posterior grade II mobs of the tibia on the femur for fluid exchange to promote imbibation and decrease edema in Bil knee joints, Bil patellar mobs with good results especially on the R following grade III in lateral/medial 8x8 sets.     Soft tissue mobilization  Foam roll to the Bil quads and L hip adductor due to noted tightness/tenderness; decreased minimally following STM    Passive ROM  Flexion/extension P/ROM at Bil knees with decreased pain following mobilizations.     Muscle Energy Technique  Shot gun technique following pelvic exam which demonstrated inferior L ASIS when compared to the R that improved significantly following shot gun technique. Pt was taught how to perform self stabilization at the pelvis following reports of tenderness at the L adductor tendon during standing hip abdct on the L.              PT Education - 01/07/19 1124    Education Details  Pt is adding isometric hip abdct, addct to her exercises at home. Discussed using heat on the Bil quads and patellar mobs prior to exercise to decrease stress on the knees.    Person(s) Educated  Patient    Methods  Explanation;Demonstration;Verbal cues    Comprehension  Verbalized understanding;Returned demonstration       PT Short Term Goals - 12/19/18 1253      PT SHORT TERM GOAL #1   Title  be independent in  initial HEP    Status  On-going      PT SHORT TERM GOAL #2   Title  initiate a walking program and verbalize understanding of how  to progress slowly    Status  New        PT Long Term Goals - 12/05/18 1058      PT LONG TERM GOAL #1   Title  be independent in advanced HEP    Baseline  --    Time  8    Period  Weeks    Status  New    Target Date  01/30/19      PT LONG TERM GOAL #2   Title  reduce FOTO to < or = to 37% limitation    Baseline  --    Time  8    Period  Weeks    Status  New    Target Date  01/30/19      PT LONG TERM GOAL #3   Title  perform regular walking or gym exercise and verbalize understanding of safe progression for strength and endurance    Baseline  --    Time  8    Period  Weeks    Status  New    Target Date  01/30/19      PT LONG TERM GOAL #4   Title  report a 60% reduction in bil knee and Rt hip pain with standing and walking to improve tolerance    Baseline  --    Time  8    Period  Weeks    Status  New    Target Date  01/30/19      PT LONG TERM GOAL #5   Title  demonstrate 4+/5 bil knee strength to improve endurance and safety on steps    Baseline  --    Time  8    Period  Weeks    Status  New    Target Date  01/30/19            Plan - 01/07/19 1012    Clinical Impression Statement  Pt immediately report pain in her L groin area with standing hip abd; VC to decrease ROM helped with pain but she was still able to feel pain. Palpated with findings of pinpoint tenderness at the tendon of the L adductor. Pelvis was examined with findings of inferior innominate of the L compared to the R that improved significantly and easily with shot gun technique. Pt reports decreased knee pain at end of session.    Personal Factors and Comorbidities  Comorbidity 2    Comorbidities  osteopenia, HTN, history of breast cancer    Examination-Activity Limitations  Locomotion Level;Stand;Stairs;Squat;Transfers    Examination-Participation Restrictions   Cleaning;Community Activity;Meal Prep    PT Frequency  2x / week    PT Duration  8 weeks    PT Treatment/Interventions  ADLs/Self Care Home Management;Cryotherapy;Ultrasound;Moist Heat;Electrical Stimulation;Gait training;Stair training;Balance training;Therapeutic exercise;Therapeutic activities;Functional mobility training;Neuromuscular re-education;Patient/family education;Manual techniques;Passive range of motion;Dry needling;Taping;Spinal Manipulations    PT Next Visit Plan  possible aquatics if evaluating/primary PT feels it would be beneficial; continue blend of standing and supine (if supine place pillow for tailbone pain) ther ex with minimizing knee flexion/ext reps, focus on hips, monitor pain    PT Home Exercise Plan  Access Code: [email or web address], add isometric hip abdct, addct       Patient will benefit from skilled therapeutic intervention in order to improve the following deficits and impairments:  Abnormal gait, Decreased activity tolerance, Decreased endurance, Decreased range of motion, Decreased mobility, Decreased strength, Difficulty walking, Impaired flexibility, Postural dysfunction, Improper body mechanics  Visit Diagnosis: Abnormal posture  Chronic  pain of right knee  Muscle weakness (generalized)     Problem List Patient Active Problem List   Diagnosis Date Noted  . Genetic testing 12/14/2017  . Family history of uterine cancer   . Family history of prostate cancer   . Melanoma in situ of right upper extremity ([place]) 07/18/2017  . Port-A-Cath in place 04/18/2017  . Drug-induced neutropenia ([place]) 03/09/2017  . Family history of breast cancer 02/02/2017  . Family history of colon cancer 02/02/2017  . Family history of prostate cancer in father 02/02/2017  . Malignant neoplasm of overlapping sites of left breast in female, estrogen receptor positive ([place]) 01/31/2017  . Hx of hot flashes, menopausal, HRT in the past 12/19/2011  . Osteopenia, stable, DEXA 2013, recs to  repeat in 2018 12/19/2011  . Hx of Hyperlipidemia, LDL 144 in 07/2011 12/19/2011  . Vitamin D insufficiency 12/19/2011  . [place], PT 01/07/2019, 11:47 AM  [facility] [address], [place], [place], [address] Phone: [phone]   Fax:  [phone]  Name: Brooke [name] MRN: [id number] Date of Birth: August 31, 1950

## 2019-01-09 ENCOUNTER — Encounter: Payer: Self-pay | Admitting: Physical Therapy

## 2019-01-09 ENCOUNTER — Other Ambulatory Visit: Payer: Self-pay

## 2019-01-09 ENCOUNTER — Ambulatory Visit: Payer: Medicare Other | Admitting: Physical Therapy

## 2019-01-09 DIAGNOSIS — R293 Abnormal posture: Secondary | ICD-10-CM | POA: Diagnosis not present

## 2019-01-09 DIAGNOSIS — M6281 Muscle weakness (generalized): Secondary | ICD-10-CM

## 2019-01-09 DIAGNOSIS — M25551 Pain in right hip: Secondary | ICD-10-CM

## 2019-01-09 DIAGNOSIS — G8929 Other chronic pain: Secondary | ICD-10-CM

## 2019-01-09 NOTE — Therapy (Signed)
[facility] [address], [place], [place], [address] Phone: [phone]   Fax:  [phone]  Physical Therapy Treatment  Patient Details  Name: Brooke [name] MRN: [id number] Date of Birth: [birth year] Referring Provider (PT): Micheline Rough, MD   Encounter Date: 01/09/2019  PT End of Session - 01/09/19 1138    Visit Number  9    Date for PT Re-Evaluation  01/30/19    Authorization Type  [employer]HC Medicare    PT Start Time  0930    PT Stop Time  [id number]    PT Time Calculation (min)  45 min    Activity Tolerance  Patient tolerated treatment well;No increased pain    Behavior During Therapy  WFL for tasks assessed/performed       Past Medical History:  Diagnosis Date  . Anemia   . Anxiety   . Arthritis   . Bilateral dry eyes   . Breast cancer, left breast ([place])   . Family history of breast cancer 02/02/2017  . Family history of breast cancer   . Family history of colon cancer 02/02/2017  . Family history of colon cancer   . Family history of prostate cancer   . Family history of prostate cancer in father 02/02/2017  . Family history of uterine cancer   . Fibroid   . History of kidney stones   . Hyperlipidemia   . Kidney stones   . Melanoma of upper arm ([place]) 2009   RIGHT ARM   . Obesity (BMI 30.0-34.9)   . Osteopenia 09/2017   T score -1.9 max 8% / 0.8% statistically significant decline at right and left hip stable at the spine  . PONV (postoperative nausea and vomiting)   . Squamous carcinoma     of the skin  . UTI (urinary tract infection)   . Varicose veins of both lower extremities   . Vitamin D deficiency 07/2009   LOW VITAMIN D 29  . Vitamin deficiency 07/2008   VITAMIN D LOW 23    Past Surgical History:  Procedure Laterality Date  . ABDOMINAL SURGERY  1975   ovairan cyst   . APPENDECTOMY    . BREAST LUMPECTOMY WITH RADIOACTIVE SEED LOCALIZATION Right 09/25/2017   Procedure: RIGHT BREAST  LUMPECTOMY WITH RADIOACTIVE SEED LOCALIZATION;  Surgeon: Jovita Kussmaul, MD;  Location: [facility];  Service: General;  Laterality: Right;  . BREAST SURGERY     bio  . CHOLECYSTECTOMY    . DERMOID CYST  EXCISION    . DILATION AND CURETTAGE OF UTERUS  1998  . GALLBLADDER SURGERY    . HYSTEROSCOPY    . insertion  port a cath  03/02/2016  . MASTECTOMY MODIFIED RADICAL Left 09/25/2017   Procedure: LEFT MODIFIED RADICAL MASTECTOMY;  Surgeon: Jovita Kussmaul, MD;  Location: [facility];  Service: General;  Laterality: Left;  . PORTACATH PLACEMENT N/A 03/02/2017   Procedure: INSERTION PORT-A-CATH;  Surgeon: Jovita Kussmaul, MD;  Location: [facility];  Service: General;  Laterality: N/A;  . SKIN BIOPSY    . SKIN CANCER EXCISION  2010,2011   2010-RIGHT ARM, 2011-LEFT LOWER LEG.-DR Rolm Bookbinder DERM. DR. Sarajane Jews IS HER DERM SURGEON.    There were no vitals filed for this visit.  Subjective Assessment - 01/09/19 0939    Subjective  I feel a little bit of improvement in my knees since starting PT.  The pelvic alignment work seemed helpful but I still have left pinching in hip  with clamshell.    Pertinent History  Lt breast cancer    Limitations  Standing;Walking    How long can you stand comfortably?  10-15 minutes    How long can you walk comfortably?  5 minutes    Diagnostic tests  none    Patient Stated Goals  begin exercising without incresing knee pain, walk    Currently in Pain?  Yes    Pain Score  4     Pain Location  Knee    Pain Orientation  Right;Left    Pain Descriptors / Indicators  Aching    Pain Type  Chronic pain    Pain Onset  More than a month ago    Pain Frequency  Constant    Aggravating Factors   standing, walking    Pain Relieving Factors  sitting                       [facility] Adult PT Treatment/Exercise - 01/09/19 0001      Self-Care   Self-Care  Other Self-Care Comments    Other Self-Care Comments   self massage and patellar mobs in long sitting with tband over patella       Exercises   Exercises  Knee/Hip      Knee/Hip Exercises: Aerobic   Nustep  L1 seat 11, x5 min Pt present to discuss last session and current session plan       Manual Therapy   Manual Therapy  Soft tissue mobilization;Joint mobilization    Joint Mobilization  patellar mobs bil M/L/S/I Gr III/IV il    Soft tissue mobilization  ITB, lateral quad, patellar tendon bil, manual roller demo for home use with rolling pin               PT Short Term Goals - 12/19/18 1253      PT SHORT TERM GOAL #1   Title  be independent in initial HEP    Status  On-going      PT SHORT TERM GOAL #2   Title  initiate a walking program and verbalize understanding of how to progress slowly    Status  New        PT Long Term Goals - 12/05/18 1058      PT LONG TERM GOAL #1   Title  be independent in advanced HEP    Baseline  --    Time  8    Period  Weeks    Status  New    Target Date  01/30/19      PT LONG TERM GOAL #2   Title  reduce FOTO to < or = to 37% limitation    Baseline  --    Time  8    Period  Weeks    Status  New    Target Date  01/30/19      PT LONG TERM GOAL #3   Title  perform regular walking or gym exercise and verbalize understanding of safe progression for strength and endurance    Baseline  --    Time  8    Period  Weeks    Status  New    Target Date  01/30/19      PT LONG TERM GOAL #4   Title  report a 60% reduction in bil knee and Rt hip pain with standing and walking to improve tolerance    Baseline  --    Time  8  Period  Weeks    Status  New    Target Date  01/30/19      PT LONG TERM GOAL #5   Title  demonstrate 4+/5 bil knee strength to improve endurance and safety on steps    Baseline  --    Time  8    Period  Weeks    Status  New    Target Date  01/30/19            Plan - 01/09/19 1140    Clinical Impression Statement  Pt has continue to have high levels of pain and limited tolerance of ther ex and ROM due to pain despite her  best efforts to push through pain.  PT focused on patellar mobs and STM of very tight ITB/lateral quads today which allowed for greater patellar mobility end of session.  PT instructed Pt in self-mobs of patella in long sitting with other foot on floor and use of rolling pin at home for ITB/lateral quad today.  She had less pain end of session in bil knees.  PT to further assess hip/pelvis next visit.  Continue along POC with careful monitoring of response to added manual techniques.    Comorbidities  osteopenia, HTN, history of breast cancer    PT Frequency  2x / week    PT Duration  8 weeks    PT Treatment/Interventions  ADLs/Self Care Home Management;Cryotherapy;Ultrasound;Moist Heat;Electrical Stimulation;Gait training;Stair training;Balance training;Therapeutic exercise;Therapeutic activities;Functional mobility training;Neuromuscular re-education;Patient/family education;Manual techniques;Passive range of motion;Dry needling;Taping;Spinal Manipulations    PT Next Visit Plan  f/u on manual to knees, self mobs/rolling pin at home, continue manual, P/ROM techniques, recheck pelvic alignment    PT Home Exercise Plan  Access Code: [email or web address]    Consulted and Agree with Plan of Care  Patient       Patient will benefit from skilled therapeutic intervention in order to improve the following deficits and impairments:     Visit Diagnosis: Abnormal posture  Chronic pain of right knee  Muscle weakness (generalized)  Pain in right hip     Problem List Patient Active Problem List   Diagnosis Date Noted  . Genetic testing 12/14/2017  . Family history of uterine cancer   . Family history of prostate cancer   . Melanoma in situ of right upper extremity ([place]) 07/18/2017  . Port-A-Cath in place 04/18/2017  . Drug-induced neutropenia ([place]) 03/09/2017  . Family history of breast cancer 02/02/2017  . Family history of colon cancer 02/02/2017  . Family history of prostate cancer in father 02/02/2017   . Malignant neoplasm of overlapping sites of left breast in female, estrogen receptor positive ([place]) 01/31/2017  . Hx of hot flashes, menopausal, HRT in the past 12/19/2011  . Osteopenia, stable, DEXA 2013, recs to repeat in 2018 12/19/2011  . Hx of Hyperlipidemia, LDL 144 in 07/2011 12/19/2011  . Vitamin D insufficiency 12/19/2011  . Fibroid     Baruch Merl, PT 01/09/19 11:44 AM   [facility] [address], [place], [place], [address] Phone: [phone]   Fax:  [phone]  Name: Brooke [name] MRN: [id number] Date of Birth: [birth year]

## 2019-01-11 ENCOUNTER — Encounter: Payer: Self-pay | Admitting: Gynecology

## 2019-01-11 ENCOUNTER — Other Ambulatory Visit: Payer: Self-pay

## 2019-01-11 ENCOUNTER — Ambulatory Visit: Payer: Medicare Other | Admitting: Gynecology

## 2019-01-11 VITALS — BP 136/80 | Ht 68.0 in | Wt 230.0 lb

## 2019-01-11 DIAGNOSIS — N952 Postmenopausal atrophic vaginitis: Secondary | ICD-10-CM | POA: Diagnosis not present

## 2019-01-11 DIAGNOSIS — Z01419 Encounter for gynecological examination (general) (routine) without abnormal findings: Secondary | ICD-10-CM | POA: Diagnosis not present

## 2019-01-11 DIAGNOSIS — M8580 Other specified disorders of bone density and structure, unspecified site: Secondary | ICD-10-CM | POA: Diagnosis not present

## 2019-01-11 DIAGNOSIS — Z853 Personal history of malignant neoplasm of breast: Secondary | ICD-10-CM

## 2019-01-11 NOTE — Addendum Note (Signed)
Addended by: Nelva Nay on: 01/11/2019 10:15 AM   Modules accepted: Orders

## 2019-01-11 NOTE — Patient Instructions (Signed)
Follow-up in 1 year for annual exam.  We will plan on repeating your bone density next year.

## 2019-01-11 NOTE — Progress Notes (Signed)
    LENDER PASOS 18-Jul-1950 [id number]        68 y.o.  G0P0000 for breast and pelvic exam.  Without gynecologic complaints.  Actively being followed for her breast cancer.  Has had a left mastectomy since our last appointment.  Past medical history,surgical history, problem list, medications, allergies, family history and social history were all reviewed and documented as reviewed in the EPIC chart.  ROS:  Performed with pertinent positives and negatives included in the history, assessment and plan.   Additional significant findings : None   Exam: Brooke [name] assistant Vitals:   01/11/19 0854  BP: 136/80  Weight: 230 lb (104.3 kg)  Height: 5\' 8"  (1.727 m)   Body mass index is 34.97 kg/m.  General appearance:  Normal affect, orientation and appearance. Skin: Grossly normal HEENT: Without gross lesions.  No cervical or supraclavicular adenopathy. Thyroid normal.  Lungs:  Clear without wheezing, rales or rhonchi Cardiac: RR, without RMG Abdominal:  Soft, nontender, without masses, guarding, rebound, organomegaly or hernia Breasts:  Examined lying and sitting.  Right without masses, retractions, discharge or axillary adenopathy.  Left status post mastectomy.  No masses or axillary adenopathy Pelvic:  Ext, BUS, Vagina: With atrophic changes  Cervix: With atrophic changes  Uterus: Difficult to palpate but no gross masses or tenderness  Adnexa: Without masses or tenderness    Anus and perineum: Normal   Rectovaginal: Normal sphincter tone without palpated masses or tenderness.    Assessment/Plan:  68 y.o. G0P0000 female for breast and pelvic exam  1. Postmenopausal.  No significant menopausal symptoms or any vaginal bleeding. 2. Breast cancer.  Actively followed by oncology.  On tamoxifen.  Mammography 04/2018.  Exam NED.  Need to report any vaginal bleeding discussed. 3. Osteopenia.  DEXA 2019 T score -1.9 FRAX 8% / 0.8%.  Recommend follow-up DEXA next year at 2-year interval.   Check vitamin D level today as she has been known to have low vitamin D's in the past. 4. Colonoscopy never.  Strongly recommended patient have colonoscopy.  Second most common cancer in women.  She is trying to arrange Cologuard with her primary provider.  Need for colon screening stressed. 5. Pap smear 2017.  Pap smear done today.  No history of significant abnormal Pap smears. 6. Health maintenance.  No routine lab work done as patient does this elsewhere.  Follow-up 1 year, sooner as needed.   Anastasio Auerbach MD, 9:30 AM 01/11/2019

## 2019-01-12 LAB — VITAMIN D 25 HYDROXY (VIT D DEFICIENCY, FRACTURES): Vit D, 25-Hydroxy: 38 ng/mL (ref 30–100)

## 2019-01-14 ENCOUNTER — Encounter: Payer: Self-pay | Admitting: Physical Therapy

## 2019-01-14 ENCOUNTER — Other Ambulatory Visit: Payer: Self-pay

## 2019-01-14 ENCOUNTER — Ambulatory Visit: Payer: Medicare Other | Admitting: Physical Therapy

## 2019-01-14 DIAGNOSIS — R293 Abnormal posture: Secondary | ICD-10-CM | POA: Diagnosis not present

## 2019-01-14 DIAGNOSIS — M25551 Pain in right hip: Secondary | ICD-10-CM

## 2019-01-14 DIAGNOSIS — G8929 Other chronic pain: Secondary | ICD-10-CM

## 2019-01-14 DIAGNOSIS — M6281 Muscle weakness (generalized): Secondary | ICD-10-CM

## 2019-01-14 DIAGNOSIS — M25561 Pain in right knee: Secondary | ICD-10-CM

## 2019-01-14 NOTE — Therapy (Signed)
[facility] [address], [place], [place], [address] Phone: [phone]   Fax:  [phone]  Physical Therapy Treatment  Patient Details  Name: Brooke [name] MRN: [id number] Date of Birth: 1950-10-02 Referring Provider (PT): Micheline Rough, MD  Progress Note Reporting Period 12/05/18 to 01/14/19   See note below for Objective Data and Assessment of Progress/Goals.       Encounter Date: 01/14/2019  PT End of Session - 01/14/19 0939    Visit Number  10    Date for PT Re-Evaluation  01/30/19    Authorization Type  UHC Medicare    PT Start Time  0935    PT Stop Time  1015    PT Time Calculation (min)  40 min    Activity Tolerance  Patient tolerated treatment well;No increased pain    Behavior During Therapy  WFL for tasks assessed/performed       Past Medical History:  Diagnosis Date  . Anemia   . Anxiety   . Arthritis   . Bilateral dry eyes   . Breast cancer, left breast ([place])   . Family history of breast cancer 02/02/2017  . Family history of breast cancer   . Family history of colon cancer 02/02/2017  . Family history of colon cancer   . Family history of prostate cancer   . Family history of prostate cancer in father 02/02/2017  . Family history of uterine cancer   . Fibroid   . History of kidney stones   . Hyperlipidemia   . Hypertension   . Kidney stones   . Melanoma of upper arm ([place]) 2009   RIGHT ARM   . Obesity (BMI 30.0-34.9)   . Osteopenia 09/2017   T score -1.9 max 8% / 0.8% statistically significant decline at right and left hip stable at the spine  . PONV (postoperative nausea and vomiting)   . Squamous carcinoma     of the skin  . UTI (urinary tract infection)   . Varicose veins of both lower extremities   . Vitamin D deficiency 07/2009   LOW VITAMIN D 29  . Vitamin deficiency 07/2008   VITAMIN D LOW 23    Past Surgical History:  Procedure Laterality Date  . ABDOMINAL  SURGERY  1975   ovairan cyst   . APPENDECTOMY    . BREAST LUMPECTOMY WITH RADIOACTIVE SEED LOCALIZATION Right 09/25/2017   Procedure: RIGHT BREAST LUMPECTOMY WITH RADIOACTIVE SEED LOCALIZATION;  Surgeon: Jovita Kussmaul, MD;  Location: [facility];  Service: General;  Laterality: Right;  . BREAST SURGERY     bio  . CHOLECYSTECTOMY    . DERMOID CYST  EXCISION    . DILATION AND CURETTAGE OF UTERUS  1998  . GALLBLADDER SURGERY    . HYSTEROSCOPY    . insertion  port a cath  03/02/2016  . MASTECTOMY MODIFIED RADICAL Left 09/25/2017   Procedure: LEFT MODIFIED RADICAL MASTECTOMY;  Surgeon: Jovita Kussmaul, MD;  Location: [facility];  Service: General;  Laterality: Left;  . PORTACATH PLACEMENT N/A 03/02/2017   Procedure: INSERTION PORT-A-CATH;  Surgeon: Jovita Kussmaul, MD;  Location: [facility];  Service: General;  Laterality: N/A;  . SKIN BIOPSY    . SKIN CANCER EXCISION  2010,2011   2010-RIGHT ARM, 2011-LEFT LOWER LEG.-DR Rolm Bookbinder DERM. DR. Sarajane Jews IS HER DERM SURGEON.    There were no vitals filed for this visit.  Subjective Assessment - 01/14/19 0936    Subjective  The work on the knees here and at home seems to be helping both knees.  I do have increased pain if I have to stand for any length of time > 15 min.    Pertinent History  Lt breast cancer    Limitations  Standing;Walking    How long can you stand comfortably?  10-15 minutes    How long can you walk comfortably?  5 minutes    Diagnostic tests  none    Patient Stated Goals  begin exercising without incresing knee pain, walk    Currently in Pain?  Yes    Pain Score  4     Pain Location  Knee    Pain Orientation  Right;Left    Pain Descriptors / Indicators  Aching    Pain Type  Chronic pain    Pain Onset  More than a month ago    Pain Frequency  Constant    Aggravating Factors   stand > 15', walking >5'    Pain Relieving Factors  sitting         [facility] PT Assessment - 01/14/19 0001      Assessment   Medical Diagnosis  athralgia,  unspecified joint, chronic pain of Rt knee, trochanteric bursitis Rt    Referring Provider (PT)  Micheline Rough, MD    Onset Date/Surgical Date  11/05/18    Next MD Visit  as needed     Prior Therapy  none      ROM / Strength   AROM / PROM / Strength  AROM;Strength      AROM   Overall AROM   Deficits    Right Knee Extension  10    Right Knee Flexion  119    Left Knee Extension  -3    Left Knee Flexion  118      Strength   Overall Strength  Deficits    Right Hip Flexion  4-/5    Right Hip External Rotation   4/5    Right Hip Internal Rotation  4/5    Right Hip ABduction  4/5    Left Hip Flexion  4-/5    Left Hip External Rotation  4+/5    Left Hip Internal Rotation  4+/5    Left Hip ABduction  4+/5    Right Knee Flexion  4+/5    Right Knee Extension  4-/5    Left Knee Flexion  4+/5    Left Knee Extension  4/5      Palpation   SI assessment   Lt innominant ant rot, outflare present, femoral head sits anteriorly and tender, improved with MET                   [facility] Adult PT Treatment/Exercise - 01/14/19 0001      Exercises   Exercises  Knee/Hip      Knee/Hip Exercises: Stretches   Other Knee/Hip Stretches  butterfly stretch bil LEs with legs on bolster - add to HEP 3x30 sec      Knee/Hip Exercises: Aerobic   Nustep  L1 seat 11, x7 min Pt present to discuss last session and current session plan       Manual Therapy   Manual Therapy  Muscle Energy Technique;Joint mobilization    Joint Mobilization  Lt hip distraction and posterior glide with movement, patellar mobs bil M/L/S/I    Muscle Energy Technique  To address Left anterior rotation, Lt outflare  PT Short Term Goals - 12/19/18 1253      PT SHORT TERM GOAL #1   Title  be independent in initial HEP    Status  On-going      PT SHORT TERM GOAL #2   Title  initiate a walking program and verbalize understanding of how to progress slowly    Status  New        PT Long Term  Goals - 01/14/19 1207      PT LONG TERM GOAL #1   Title  be independent in advanced HEP    Baseline  slower progress due to multiple high level painful joints in bil LEs    Status  On-going      PT LONG TERM GOAL #3   Title  perform regular walking or gym exercise and verbalize understanding of safe progression for strength and endurance    Baseline  pain limits walking to 5 min    Status  On-going      PT LONG TERM GOAL #4   Title  report a 60% reduction in bil knee and Rt hip pain with standing and walking to improve tolerance    Status  On-going      PT LONG TERM GOAL #5   Title  demonstrate 4+/5 bil knee strength to improve endurance and safety on steps    Baseline  ranges from 4-4+/5    Status  On-going            Plan - 01/14/19 1201    Clinical Impression Statement  PT addressed pelvic and hip alignment today with MET and release techniques of Lt hip with improved symmetry and pain.  Pt has done very well with self-patellar mobs with improving mobility M/L>S/I bil.  PT discussed possibility of DN to lower quadrant next visit but Pt needs to check on meds/risks before deciding to proceed.  ROM in bil knees has improved into extension but still lacks 10 deg on Rt and 3 deg on Lt.  She continues to be limited by pain in standing > 56mn but feels motivated to continue with conservative treatment at this time.  Continue along current POC with slower progress due to multiple pain locations and progressive arthritic joints in bil LEs.    Comorbidities  osteopenia, HTN, history of breast cancer    Rehab Potential  Good    PT Frequency  2x / week    PT Duration  8 weeks    PT Treatment/Interventions  ADLs/Self Care Home Management;Cryotherapy;Ultrasound;Moist Heat;Electrical Stimulation;Gait training;Stair training;Balance training;Therapeutic exercise;Therapeutic activities;Functional mobility training;Neuromuscular re-education;Patient/family education;Manual techniques;Passive  range of motion;Dry needling;Taping;Spinal Manipulations    PT Next Visit Plan  revisit idea of DN, recheck pelvic and Lt hip alignment, mobs for patella and STM surrounding bil knees    PT Home Exercise Plan  Access Code: [email or web address]    Consulted and Agree with Plan of Care  Patient       Patient will benefit from skilled therapeutic intervention in order to improve the following deficits and impairments:     Visit Diagnosis: Abnormal posture  Chronic pain of right knee  Muscle weakness (generalized)  Pain in right hip     Problem List Patient Active Problem List   Diagnosis Date Noted  . Genetic testing 12/14/2017  . Family history of uterine cancer   . Family history of prostate cancer   . Melanoma in situ of right upper extremity ([place] 07/18/2017  . Port-A-Cath in place 04/18/2017  .  Drug-induced neutropenia ([place]) 03/09/2017  . Family history of breast cancer 02/02/2017  . Family history of colon cancer 02/02/2017  . Family history of prostate cancer in father 02/02/2017  . Malignant neoplasm of overlapping sites of left breast in female, estrogen receptor positive ([place]) 01/31/2017  . Hx of hot flashes, menopausal, HRT in the past 12/19/2011  . Osteopenia, stable, DEXA 2013, recs to repeat in 2018 12/19/2011  . Hx of Hyperlipidemia, LDL 144 in 07/2011 12/19/2011  . Vitamin D insufficiency 12/19/2011  . Fibroid     Baruch Merl, PT 01/14/19 12:10 PM   [facility] [address] [place], [place], [address] Phone: [phone]   Fax:  [phone]  Name: Brooke [name] MRN: [id number] Date of Birth: May 09, 1950

## 2019-01-16 ENCOUNTER — Ambulatory Visit: Payer: Medicare Other | Admitting: Physical Therapy

## 2019-01-16 LAB — PAP IG W/ RFLX HPV ASCU

## 2019-01-16 LAB — HUMAN PAPILLOMAVIRUS, HIGH RISK: HPV DNA High Risk: NOT DETECTED

## 2019-01-17 ENCOUNTER — Telehealth: Payer: Self-pay

## 2019-01-17 ENCOUNTER — Encounter: Payer: Self-pay | Admitting: Gynecology

## 2019-01-17 NOTE — Telephone Encounter (Signed)
-----   Message from Anastasio Auerbach, MD sent at 01/17/2019  7:53 AM EST ----- Tell patient her Pap smear returned showing minimal atypia.  The reflex HPV screen was negative.  Many reasons to see minimal atypia to include just being postmenopausal.  It is nothing that we need to be overly concerned about.  The recommendation is to repeat the Pap smear in 1 year instead of waiting the several years that we normally do.  I would be happy to speak with her if she is concerned or has questions but otherwise just so that she knows and reminds whoever she sees next year to repeat her Pap smear.

## 2019-01-17 NOTE — Telephone Encounter (Signed)
Patient informed. She had many questions. Some were about her own history of cancer/Tamoxifen, sister's history of stage IV endometrial cancer.  She said she is happy to schedule visit to come discuss with you. I told her your note said you would be happy to speak with her.  Did you want to call her or have her come in to talk?

## 2019-01-18 NOTE — Telephone Encounter (Signed)
I called the patient back and we discussed her ASCUS negative high risk HPV Pap smear.  I answered her questions and she will plan on repeating her Pap smear in 1 year at her next annual exam.

## 2019-01-23 ENCOUNTER — Encounter: Payer: Self-pay | Admitting: Physical Therapy

## 2019-01-23 ENCOUNTER — Ambulatory Visit: Payer: Medicare Other | Admitting: Physical Therapy

## 2019-01-23 ENCOUNTER — Other Ambulatory Visit: Payer: Self-pay

## 2019-01-23 DIAGNOSIS — M25561 Pain in right knee: Secondary | ICD-10-CM

## 2019-01-23 DIAGNOSIS — R293 Abnormal posture: Secondary | ICD-10-CM | POA: Diagnosis not present

## 2019-01-23 DIAGNOSIS — M25551 Pain in right hip: Secondary | ICD-10-CM

## 2019-01-23 DIAGNOSIS — M6281 Muscle weakness (generalized): Secondary | ICD-10-CM

## 2019-01-23 DIAGNOSIS — G8929 Other chronic pain: Secondary | ICD-10-CM

## 2019-01-23 NOTE — Therapy (Signed)
[facility] [address], [place], [place], [address] Phone: [phone]   Fax:  [phone]  Physical Therapy Treatment  Patient Details  Name: Brooke [name] MRN: [id number] Date of Birth: 1950/04/12 Referring Provider (PT): Micheline Rough, MD   Encounter Date: 01/23/2019  PT End of Session - 01/23/19 1101    Visit Number  11    Date for PT Re-Evaluation  01/30/19    Authorization Type  [employer]HC Medicare    PT Start Time  [id number]    PT Stop Time  1100    PT Time Calculation (min)  45 min    Activity Tolerance  Patient tolerated treatment well;Patient limited by pain    Behavior During Therapy  [facility] for tasks assessed/performed       Past Medical History:  Diagnosis Date  . Anemia   . Anxiety   . Arthritis   . ASCUS of cervix with negative high risk HPV 12/2018  . Bilateral dry eyes   . Breast cancer, left breast ([place])   . Family history of breast cancer 02/02/2017  . Family history of breast cancer   . Family history of colon cancer 02/02/2017  . Family history of colon cancer   . Family history of prostate cancer   . Family history of prostate cancer in father 02/02/2017  . Family history of uterine cancer   . Fibroid   . History of kidney stones   . Hyperlipidemia   . Hypertension   . Kidney stones   . Melanoma of upper arm ([place]) 2009   RIGHT ARM   . Obesity (BMI 30.0-34.9)   . Osteopenia 09/2017   T score -1.9 max 8% / 0.8% statistically significant decline at right and left hip stable at the spine  . PONV (postoperative nausea and vomiting)   . Squamous carcinoma     of the skin  . UTI (urinary tract infection)   . Varicose veins of both lower extremities   . Vitamin D deficiency 07/2009   LOW VITAMIN D 29  . Vitamin deficiency 07/2008   VITAMIN D LOW 23    Past Surgical History:  Procedure Laterality Date  . ABDOMINAL SURGERY  1975   ovairan cyst   . APPENDECTOMY    . BREAST LUMPECTOMY WITH  RADIOACTIVE SEED LOCALIZATION Right 09/25/2017   Procedure: RIGHT BREAST LUMPECTOMY WITH RADIOACTIVE SEED LOCALIZATION;  Surgeon: Jovita Kussmaul, MD;  Location: [place];  Service: General;  Laterality: Right;  . BREAST SURGERY     bio  . CHOLECYSTECTOMY    . DERMOID CYST  EXCISION    . DILATION AND CURETTAGE OF UTERUS  1998  . GALLBLADDER SURGERY    . HYSTEROSCOPY    . insertion  port a cath  03/02/2016  . MASTECTOMY MODIFIED RADICAL Left 09/25/2017   Procedure: LEFT MODIFIED RADICAL MASTECTOMY;  Surgeon: Jovita Kussmaul, MD;  Location: [place];  Service: General;  Laterality: Left;  . PORTACATH PLACEMENT N/A 03/02/2017   Procedure: INSERTION PORT-A-CATH;  Surgeon: Jovita Kussmaul, MD;  Location: [place];  Service: General;  Laterality: N/A;  . SKIN BIOPSY    . SKIN CANCER EXCISION  2010,2011   2010-RIGHT ARM, 2011-LEFT LOWER LEG.-DR Rolm Bookbinder DERM. DR. Sarajane Jews IS HER DERM SURGEON.    There were no vitals filed for this visit.  Subjective Assessment - 01/23/19 1025    Subjective  Ongoing bil knee pain worse with standing.  Pain consistently 4/10 but  can drop me to my knees on occassion with pain on outside of both knees.    Pertinent History  Lt breast cancer    Limitations  Standing;Walking    How long can you stand comfortably?  10-15 minutes    How long can you walk comfortably?  5 minutes    Diagnostic tests  none    Patient Stated Goals  begin exercising without incresing knee pain, walk    Currently in Pain?  Yes    Pain Score  4     Pain Location  Knee    Pain Orientation  Right;Left    Pain Descriptors / Indicators  Aching    Pain Type  Chronic pain    Pain Onset  More than a month ago    Aggravating Factors   stand > 15', walk > 5'    Pain Relieving Factors  sitting                       [facility] Adult PT Treatment/Exercise - 01/23/19 0001      Exercises   Exercises  Knee/Hip      Knee/Hip Exercises: Stretches   Gastroc Stretch  Both;2 reps;30 seconds     Gastroc Stretch Limitations  rocker board      Knee/Hip Exercises: Standing   Heel Raises  Both;15 reps    Heel Raises Limitations  PT cued stay over first two toes, avoid rocking    Knee Flexion  Strengthening;Both;1 set;10 reps    Knee Flexion Limitations  2#    Hip Abduction  Stengthening;AROM;Both;10 reps;Knee straight;1 set    Hip Extension  Stengthening;AROM;Both;1 set;10 reps;Knee straight    Other Standing Knee Exercises  marching Rt LE only for A/ROM x 20 reps      Knee/Hip Exercises: Supine   Short Arc Quad Sets  Strengthening;Both;2 sets;15 reps    Short Arc Quad Sets Limitations  1#    Straight Leg Raises  Strengthening;Both;5 reps;2 sets    Straight Leg Raises Limitations  bil      Knee/Hip Exercises: Sidelying   Clams  20x red band bil, pillow under hip               PT Short Term Goals - 12/19/18 1253      PT SHORT TERM GOAL #1   Title  be independent in initial HEP    Status  On-going      PT SHORT TERM GOAL #2   Title  initiate a walking program and verbalize understanding of how to progress slowly    Status  New        PT Long Term Goals - 01/14/19 1207      PT LONG TERM GOAL #1   Title  be independent in advanced HEP    Baseline  slower progress due to multiple high level painful joints in bil LEs    Status  On-going      PT LONG TERM GOAL #3   Title  perform regular walking or gym exercise and verbalize understanding of safe progression for strength and endurance    Baseline  pain limits walking to 5 min    Status  On-going      PT LONG TERM GOAL #4   Title  report a 60% reduction in bil knee and Rt hip pain with standing and walking to improve tolerance    Status  On-going      PT LONG TERM GOAL #5  Title  demonstrate 4+/5 bil knee strength to improve endurance and safety on steps    Baseline  ranges from 4-4+/5    Status  On-going            Plan - 01/23/19 1102    Clinical Impression Statement  Exercise focused session  today which is adjusted as needed for pain.  Pt able to perform 6 SLRs bil before fatigue.  Pt needed cueing for heel raises for proper form to avoid rocking and have better recruitment.  Pt has pain standing in SLS while doing contralateral hip strengthening.  PT added gastroc stretch to HEP as Pt has limited flexibility which is contributing to end range knee ext limitations.  Slower progress due to extensive arthritic pain in bil knees and bil hip hip pain.  Continue along POC with careful monitoring of tolerance.  Pt continues to be compliant with HEP and has improving patellar mobility bil.    Rehab Potential  Good    PT Frequency  2x / week    PT Duration  8 weeks    PT Treatment/Interventions  ADLs/Self Care Home Management;Cryotherapy;Ultrasound;Moist Heat;Electrical Stimulation;Gait training;Stair training;Balance training;Therapeutic exercise;Therapeutic activities;Functional mobility training;Neuromuscular re-education;Patient/family education;Manual techniques;Passive range of motion;Dry needling;Taping;Spinal Manipulations    PT Next Visit Plan  revisit HEP, patellar mobs, LE stretches, LE strength, manual techniques as needed    PT Home Exercise Plan  Access Code: [email or web address]       Patient will benefit from skilled therapeutic intervention in order to improve the following deficits and impairments:     Visit Diagnosis: Abnormal posture  Chronic pain of right knee  Muscle weakness (generalized)  Pain in right hip     Problem List Patient Active Problem List   Diagnosis Date Noted  . Genetic testing 12/14/2017  . Family history of uterine cancer   . Family history of prostate cancer   . Melanoma in situ of right upper extremity ([place]) 07/18/2017  . Port-A-Cath in place 04/18/2017  . Drug-induced neutropenia ([place]) 03/09/2017  . Family history of breast cancer 02/02/2017  . Family history of colon cancer 02/02/2017  . Family history of prostate cancer in father 02/02/2017  .  Malignant neoplasm of overlapping sites of left breast in female, estrogen receptor positive ([place]) 01/31/2017  . Hx of hot flashes, menopausal, HRT in the past 12/19/2011  . Osteopenia, stable, DEXA 2013, recs to repeat in 2018 12/19/2011  . Hx of Hyperlipidemia, LDL 144 in 07/2011 12/19/2011  . Vitamin D insufficiency 12/19/2011  . Fibroid     Baruch Merl, PT 01/23/19 11:05 AM   [facility] [address], [place], [place], [address] Phone: [phone]   Fax:  [phone]  Name: LATAYVIA HAYENGA MRN: [id number] Date of Birth: [birth year]

## 2019-01-28 ENCOUNTER — Ambulatory Visit: Payer: Medicare Other | Admitting: Physical Therapy

## 2019-01-28 ENCOUNTER — Other Ambulatory Visit: Payer: Self-pay

## 2019-01-28 ENCOUNTER — Encounter: Payer: Self-pay | Admitting: Physical Therapy

## 2019-01-28 DIAGNOSIS — G8929 Other chronic pain: Secondary | ICD-10-CM

## 2019-01-28 DIAGNOSIS — R293 Abnormal posture: Secondary | ICD-10-CM | POA: Diagnosis not present

## 2019-01-28 DIAGNOSIS — M6281 Muscle weakness (generalized): Secondary | ICD-10-CM

## 2019-01-28 DIAGNOSIS — M25551 Pain in right hip: Secondary | ICD-10-CM

## 2019-01-28 DIAGNOSIS — M25561 Pain in right knee: Secondary | ICD-10-CM

## 2019-01-28 NOTE — Therapy (Signed)
[facility] [address], [place], [place], [address] Phone: [phone]   Fax:  [phone]  Physical Therapy Treatment  Patient Details  Name: Brooke [name] MRN: [id number] Date of Birth: 15-Jan-1951 Referring Provider (PT): Micheline Rough, MD   Encounter Date: 01/28/2019  PT End of Session - 01/28/19 1109    Visit Number  12    Date for PT Re-Evaluation  01/30/19    Authorization Type  [employer]HC Medicare    PT Start Time  1100    PT Stop Time  1145    PT Time Calculation (min)  45 min    Activity Tolerance  Patient tolerated treatment well;Patient limited by pain    Behavior During Therapy  [facility] for tasks assessed/performed       Past Medical History:  Diagnosis Date  . Anemia   . Anxiety   . Arthritis   . ASCUS of cervix with negative high risk HPV 12/2018  . Bilateral dry eyes   . Breast cancer, left breast ([place])   . Family history of breast cancer 02/02/2017  . Family history of breast cancer   . Family history of colon cancer 02/02/2017  . Family history of colon cancer   . Family history of prostate cancer   . Family history of prostate cancer in father 02/02/2017  . Family history of uterine cancer   . Fibroid   . History of kidney stones   . Hyperlipidemia   . Hypertension   . Kidney stones   . Melanoma of upper arm ([place]) 2009   RIGHT ARM   . Obesity (BMI 30.0-34.9)   . Osteopenia 09/2017   T score -1.9 max 8% / 0.8% statistically significant decline at right and left hip stable at the spine  . PONV (postoperative nausea and vomiting)   . Squamous carcinoma     of the skin  . UTI (urinary tract infection)   . Varicose veins of both lower extremities   . Vitamin D deficiency 07/2009   LOW VITAMIN D 29  . Vitamin deficiency 07/2008   VITAMIN D LOW 23    Past Surgical History:  Procedure Laterality Date  . ABDOMINAL SURGERY  1975   ovairan cyst   . APPENDECTOMY    . BREAST LUMPECTOMY WITH  RADIOACTIVE SEED LOCALIZATION Right 09/25/2017   Procedure: RIGHT BREAST LUMPECTOMY WITH RADIOACTIVE SEED LOCALIZATION;  Surgeon: Jovita Kussmaul, MD;  Location: [place];  Service: General;  Laterality: Right;  . BREAST SURGERY     bio  . CHOLECYSTECTOMY    . DERMOID CYST  EXCISION    . DILATION AND CURETTAGE OF UTERUS  1998  . GALLBLADDER SURGERY    . HYSTEROSCOPY    . insertion  port a cath  03/02/2016  . MASTECTOMY MODIFIED RADICAL Left 09/25/2017   Procedure: LEFT MODIFIED RADICAL MASTECTOMY;  Surgeon: Jovita Kussmaul, MD;  Location: [place];  Service: General;  Laterality: Left;  . PORTACATH PLACEMENT N/A 03/02/2017   Procedure: INSERTION PORT-A-CATH;  Surgeon: Jovita Kussmaul, MD;  Location: [place];  Service: General;  Laterality: N/A;  . SKIN BIOPSY    . SKIN CANCER EXCISION  2010,2011   2010-RIGHT ARM, 2011-LEFT LOWER LEG.-DR Rolm Bookbinder DERM. DR. Sarajane Jews IS HER DERM SURGEON.    There were no vitals filed for this visit.  Subjective Assessment - 01/28/19 1105    Subjective  Clamshell bothers the pinching in front of Lt hip.  I do  like the calf stretch and feel like it helps but can't get it as strong as I did using the rocker board here.    Pertinent History  Lt breast cancer    Limitations  Standing;Walking    How long can you stand comfortably?  10-15 minutes    How long can you walk comfortably?  5 minutes    Diagnostic tests  none    Patient Stated Goals  begin exercising without incresing knee pain, walk    Currently in Pain?  Yes    Pain Score  4     Pain Location  Knee    Pain Orientation  Right;Left    Pain Descriptors / Indicators  Aching    Pain Type  Chronic pain    Pain Onset  More than a month ago    Pain Frequency  Constant    Aggravating Factors   stand > 15', walk > 5'    Pain Relieving Factors  sitting                       [facility] Adult PT Treatment/Exercise - 01/28/19 0001      Lumbar Exercises: Aerobic   Nustep  L2 x 8', PT present to discuss  progress and plan for today's session      Knee/Hip Exercises: Stretches   Hip Flexor Stretch  Both;1 rep;30 seconds    Hip Flexor Stretch Limitations  foot on third step    Gastroc Stretch  Both;1 rep;30 seconds    Gastroc Stretch Limitations  foot propped on edge of step      Knee/Hip Exercises: Standing   Heel Raises  Both;2 sets;10 reps    Hip Flexion  Stengthening;Knee straight;1 set;Both;10 reps    Hip Abduction  Stengthening;Both;10 reps;Knee straight    Hip Extension  Stengthening;10 reps;Knee straight    Other Standing Knee Exercises  quad sets on Rt for end range knee extension, no resistance x 5 reps      Manual Therapy   Manual Therapy  Soft tissue mobilization    Soft tissue mobilization  Rt gastroc med/lat heads, posterior tibialis, soleus               PT Short Term Goals - 12/19/18 1253      PT SHORT TERM GOAL #1   Title  be independent in initial HEP    Status  On-going      PT SHORT TERM GOAL #2   Title  initiate a walking program and verbalize understanding of how to progress slowly    Status  New        PT Long Term Goals - 01/14/19 1207      PT LONG TERM GOAL #1   Title  be independent in advanced HEP    Baseline  slower progress due to multiple high level painful joints in bil LEs    Status  On-going      PT LONG TERM GOAL #3   Title  perform regular walking or gym exercise and verbalize understanding of safe progression for strength and endurance    Baseline  pain limits walking to 5 min    Status  On-going      PT LONG TERM GOAL #4   Title  report a 60% reduction in bil knee and Rt hip pain with standing and walking to improve tolerance    Status  On-going      PT LONG TERM GOAL #5  Title  demonstrate 4+/5 bil knee strength to improve endurance and safety on steps    Baseline  ranges from 4-4+/5    Status  On-going            Plan - 01/28/19 1152    Clinical Impression Statement  Pt with signif limitation in Rt gastroc  flexibility with signif tenderness present throughout Rt calf gastroc, posterior tibialis.  PT added manual soft tissue release techniques before and after active stretching today.  Pt with ongoing extension restrictions in Rt knee which appear to primarily be limited by arthritic changes.  Pt has improved tolerance to standing ther ex but with ongoing knee pain during ther ex which limits progression pace.  Signif crepitus with bil knee ROM.  PT added standing hip flexion with straight knee as supine SLR is very challenging at this time.  PT also adjusted calf stretch for HEP for improved targeted stretch.  Pt will continue to benefit from skilled progression as tolerated for hip and knee stability and strength, ROM and flexibility to reduce pain and improve tolerance of daily tasks.  Re-evaluate next session.    Comorbidities  osteopenia, HTN, history of breast cancer    PT Frequency  2x / week    PT Duration  8 weeks    PT Treatment/Interventions  ADLs/Self Care Home Management;Cryotherapy;Ultrasound;Moist Heat;Electrical Stimulation;Gait training;Stair training;Balance training;Therapeutic exercise;Therapeutic activities;Functional mobility training;Neuromuscular re-education;Patient/family education;Manual techniques;Passive range of motion;Dry needling;Taping;Spinal Manipulations    PT Next Visit Plan  revisit HEP, patellar mobs, LE stretches, LE strength, manual techniques as needed    PT Home Exercise Plan  Access Code: [email or web address]    Consulted and Agree with Plan of Care  Patient       Patient will benefit from skilled therapeutic intervention in order to improve the following deficits and impairments:  Abnormal gait, Decreased activity tolerance, Decreased endurance, Decreased range of motion, Decreased mobility, Decreased strength, Difficulty walking, Impaired flexibility, Postural dysfunction, Improper body mechanics  Visit Diagnosis: Abnormal posture  Chronic pain of right knee  Muscle  weakness (generalized)  Pain in right hip     Problem List Patient Active Problem List   Diagnosis Date Noted  . Genetic testing 12/14/2017  . Family history of uterine cancer   . Family history of prostate cancer   . Melanoma in situ of right upper extremity ([place]) 07/18/2017  . Port-A-Cath in place 04/18/2017  . Drug-induced neutropenia ([place]) 03/09/2017  . Family history of breast cancer 02/02/2017  . Family history of colon cancer 02/02/2017  . Family history of prostate cancer in father 02/02/2017  . Malignant neoplasm of overlapping sites of left breast in female, estrogen receptor positive ([place]) 01/31/2017  . Hx of hot flashes, menopausal, HRT in the past 12/19/2011  . Osteopenia, stable, DEXA 2013, recs to repeat in 2018 12/19/2011  . Hx of Hyperlipidemia, LDL 144 in 07/2011 12/19/2011  . Vitamin D insufficiency 12/19/2011  . Fibroid     Baruch Merl, PT 01/28/19 12:09 PM   [facility] [address] [place], [place], [address] Phone: [phone]   Fax:  [phone]  Name: Brooke [name] MRN: [id number] Date of Birth: [birth year]

## 2019-01-30 ENCOUNTER — Encounter: Payer: Self-pay | Admitting: Physical Therapy

## 2019-01-30 ENCOUNTER — Other Ambulatory Visit: Payer: Self-pay

## 2019-01-30 ENCOUNTER — Other Ambulatory Visit: Payer: Self-pay | Admitting: Family Medicine

## 2019-01-30 ENCOUNTER — Ambulatory Visit: Payer: Medicare Other | Attending: Family Medicine | Admitting: Physical Therapy

## 2019-01-30 DIAGNOSIS — M25551 Pain in right hip: Secondary | ICD-10-CM

## 2019-01-30 DIAGNOSIS — M25552 Pain in left hip: Secondary | ICD-10-CM | POA: Diagnosis present

## 2019-01-30 DIAGNOSIS — R293 Abnormal posture: Secondary | ICD-10-CM | POA: Diagnosis not present

## 2019-01-30 DIAGNOSIS — M6281 Muscle weakness (generalized): Secondary | ICD-10-CM | POA: Diagnosis present

## 2019-01-30 DIAGNOSIS — G8929 Other chronic pain: Secondary | ICD-10-CM | POA: Diagnosis present

## 2019-01-30 DIAGNOSIS — M25561 Pain in right knee: Secondary | ICD-10-CM | POA: Insufficient documentation

## 2019-01-30 DIAGNOSIS — M25562 Pain in left knee: Secondary | ICD-10-CM

## 2019-01-30 NOTE — Therapy (Signed)
[facility] [address], [place], [place], [address] Phone: [phone]   Fax:  [phone]  Physical Therapy Treatment  Patient Details  Name: Brooke [name] MRN: [id number] Date of Birth: 16-Aug-1950 Referring Provider (PT): Micheline Rough, MD   Encounter Date: 01/30/2019  PT End of Session - 01/30/19 0946    Visit Number  13    Date for PT Re-Evaluation  01/30/19    Authorization Type  UHC Medicare    PT Start Time  [phone]   Pt late   PT Stop Time  1017    PT Time Calculation (min)  40 min    Activity Tolerance  Patient tolerated treatment well;Patient limited by pain    Behavior During Therapy  [facility] for tasks assessed/performed       Past Medical History:  Diagnosis Date  . Anemia   . Anxiety   . Arthritis   . ASCUS of cervix with negative high risk HPV 12/2018  . Bilateral dry eyes   . Breast cancer, left breast ([place])   . Family history of breast cancer 02/02/2017  . Family history of breast cancer   . Family history of colon cancer 02/02/2017  . Family history of colon cancer   . Family history of prostate cancer   . Family history of prostate cancer in father 02/02/2017  . Family history of uterine cancer   . Fibroid   . History of kidney stones   . Hyperlipidemia   . Hypertension   . Kidney stones   . Melanoma of upper arm ([place]) 2009   RIGHT ARM   . Obesity (BMI 30.0-34.9)   . Osteopenia 09/2017   T score -1.9 max 8% / 0.8% statistically significant decline at right and left hip stable at the spine  . PONV (postoperative nausea and vomiting)   . Squamous carcinoma     of the skin  . UTI (urinary tract infection)   . Varicose veins of both lower extremities   . Vitamin D deficiency 07/2009   LOW VITAMIN D 29  . Vitamin deficiency 07/2008   VITAMIN D LOW 23    Past Surgical History:  Procedure Laterality Date  . ABDOMINAL SURGERY  1975   ovairan cyst   . APPENDECTOMY    . BREAST  LUMPECTOMY WITH RADIOACTIVE SEED LOCALIZATION Right 09/25/2017   Procedure: RIGHT BREAST LUMPECTOMY WITH RADIOACTIVE SEED LOCALIZATION;  Surgeon: Jovita Kussmaul, MD;  Location: [place];  Service: General;  Laterality: Right;  . BREAST SURGERY     bio  . CHOLECYSTECTOMY    . DERMOID CYST  EXCISION    . DILATION AND CURETTAGE OF UTERUS  1998  . GALLBLADDER SURGERY    . HYSTEROSCOPY    . insertion  port a cath  03/02/2016  . MASTECTOMY MODIFIED RADICAL Left 09/25/2017   Procedure: LEFT MODIFIED RADICAL MASTECTOMY;  Surgeon: Jovita Kussmaul, MD;  Location: [place];  Service: General;  Laterality: Left;  . PORTACATH PLACEMENT N/A 03/02/2017   Procedure: INSERTION PORT-A-CATH;  Surgeon: Jovita Kussmaul, MD;  Location: [place];  Service: General;  Laterality: N/A;  . SKIN BIOPSY    . SKIN CANCER EXCISION  2010,2011   2010-RIGHT ARM, 2011-LEFT LOWER LEG.-DR Rolm Bookbinder DERM. DR. Sarajane Jews IS HER DERM SURGEON.    There were no vitals filed for this visit.  Subjective Assessment - 01/30/19 0942    Subjective  Ongoing bil knee and hip pain.  PT is  helping my mobility and strength but pain is still consistent.    Pertinent History  Lt breast cancer    Limitations  Standing;Walking    How long can you stand comfortably?  10-15 minutes    How long can you walk comfortably?  5 minutes    Diagnostic tests  none    Patient Stated Goals  begin exercising without incresing knee pain, walk    Currently in Pain?  Yes    Pain Score  4     Pain Location  Knee    Pain Orientation  Right;Left    Pain Descriptors / Indicators  Aching;Sharp    Pain Type  Chronic pain    Pain Onset  More than a month ago    Pain Frequency  Constant    Aggravating Factors   standing >15', walk > 5'    Pain Relieving Factors  sitting, rubbing knees and mobilizing kneecaps, stretching         [facility] PT Assessment - 01/30/19 0001      Assessment   Medical Diagnosis  athralgia, unspecified joint, chronic pain of Rt knee, trochanteric  bursitis Rt    Referring Provider (PT)  Micheline Rough, MD    Onset Date/Surgical Date  11/05/18    Next MD Visit  as needed     Prior Therapy  none      Observation/Other Assessments   Focus on Therapeutic Outcomes (FOTO)   53%      ROM / Strength   AROM / PROM / Strength  AROM;Strength      AROM   Overall AROM   Deficits    Right Knee Extension  10    Right Knee Flexion  119    Left Knee Extension  -3    Left Knee Flexion  118      Strength   Overall Strength  Deficits    Right Hip Flexion  4-/5    Right Hip External Rotation   4/5    Right Hip Internal Rotation  4/5    Right Hip ABduction  4/5    Left Hip Flexion  4-/5    Left Hip External Rotation  4+/5    Left Hip Internal Rotation  4+/5    Left Hip ABduction  4+/5    Right Knee Flexion  4+/5    Right Knee Extension  4-/5    Left Knee Flexion  4+/5    Left Knee Extension  4/5                   [facility] Adult PT Treatment/Exercise - 01/30/19 0001      Lumbar Exercises: Aerobic   Nustep  L3 x 9', PT present to review goals and do FOTO      Manual Therapy   Manual Therapy  Soft tissue mobilization;Joint mobilization    Joint Mobilization  Lt patella med/lat/sup/inf glides Gr I-IV    Soft tissue mobilization  Lt gastroc, peri-patellar, distal ITB               PT Short Term Goals - 12/19/18 1253      PT SHORT TERM GOAL #1   Title  be independent in initial HEP    Status  On-going      PT SHORT TERM GOAL #2   Title  initiate a walking program and verbalize understanding of how to progress slowly    Status  New        PT Long  Term Goals - 01/30/19 0947      PT LONG TERM GOAL #1   Title  be independent in advanced HEP    Baseline  slower progress due to multiple high level painful joints in bil LEs    Status  On-going      PT LONG TERM GOAL #2   Title  reduce FOTO to < or = to 37% limitation      PT LONG TERM GOAL #3   Title  perform regular walking or gym exercise and verbalize  understanding of safe progression for strength and endurance    Baseline  COVID limiting gym access, pain limits walking x 5'    Status  On-going      PT LONG TERM GOAL #4   Title  report a 60% reduction in bil knee and Rt hip pain with standing and walking to improve tolerance    Baseline  Pt reports improving mobility and strength but ongoing pain levels    Status  On-going      PT LONG TERM GOAL #5   Title  demonstrate 4+/5 bil knee strength to improve endurance and safety on steps    Baseline  ranges from 4-4+/5    Status  On-going            Plan - 01/30/19 1101    Clinical Impression Statement  Pt has made progress in ROM/mobility and strength but continues to have high levels of bil knee pain > bil hip pain which limit standing and walking to 15 and 5 min respectively.  FOTO has improved from 60% to 53% limited.  Strength in knees range from 4-/5 to 4+/5 bil.  Pt modifies all daily activities to limit time in weight bearing due to pain.  She is compliant with HEP which is somewhat limited by pain and tolerance of ther ex.  PT has instructed her in self-mobilization of bil patella which she does regularly and finds some relief with.  She is limited in bil knee extension > flexion Rt>Lt.  MD added Lt hip and Lt knee to PT order to give PT ability to address all 4 LE painful joints.  Pt is uncomfortable pursuing aquatic PT at this time due to [place] but understands this might be a great environment for her to work in in the future.  She will continue to benefit from skilled PT along POC for manual techniques, ROM, strength for bil LEs.  Slower progress due to multiple arthritic painful joints and LE soft tissue dysfunction surrounding these joints.    Comorbidities  osteopenia, HTN, history of breast cancer    Examination-Activity Limitations  Locomotion Level;Stand;Stairs;Squat;Transfers    Rehab Potential  Good    PT Frequency  2x / week    PT Duration  6 weeks    PT  Treatment/Interventions  ADLs/Self Care Home Management;Cryotherapy;Ultrasound;Moist Heat;Electrical Stimulation;Gait training;Stair training;Balance training;Therapeutic exercise;Therapeutic activities;Functional mobility training;Neuromuscular re-education;Patient/family education;Manual techniques;Passive range of motion;Dry needling;Taping;Spinal Manipulations    PT Next Visit Plan  revisit HEP, patellar mobs, LE stretches, LE strength, manual techniques as needed    PT Home Exercise Plan  Access Code: [email or web address]    Consulted and Agree with Plan of Care  Patient       Patient will benefit from skilled therapeutic intervention in order to improve the following deficits and impairments:  Abnormal gait, Decreased activity tolerance, Decreased endurance, Decreased range of motion, Decreased mobility, Decreased strength, Difficulty walking, Impaired flexibility, Postural dysfunction, Improper body mechanics  Visit  Diagnosis: Abnormal posture - Plan: PT plan of care cert/re-cert  Chronic pain of right knee - Plan: PT plan of care cert/re-cert  Muscle weakness (generalized) - Plan: PT plan of care cert/re-cert  Pain in right hip - Plan: PT plan of care cert/re-cert  Chronic pain of left knee - Plan: PT plan of care cert/re-cert  Pain in left hip - Plan: PT plan of care cert/re-cert     Problem List Patient Active Problem List   Diagnosis Date Noted  . Genetic testing 12/14/2017  . Family history of uterine cancer   . Family history of prostate cancer   . Melanoma in situ of right upper extremity ([place]) 07/18/2017  . Port-A-Cath in place 04/18/2017  . Drug-induced neutropenia ([place]) 03/09/2017  . Family history of breast cancer 02/02/2017  . Family history of colon cancer 02/02/2017  . Family history of prostate cancer in father 02/02/2017  . Malignant neoplasm of overlapping sites of left breast in female, estrogen receptor positive ([place]) 01/31/2017  . Hx of hot flashes, menopausal,  HRT in the past 12/19/2011  . Osteopenia, stable, DEXA 2013, recs to repeat in 2018 12/19/2011  . Hx of Hyperlipidemia, LDL 144 in 07/2011 12/19/2011  . Vitamin D insufficiency 12/19/2011  . Fibroid    Baruch Merl, PT 01/30/19 1:16 PM   [facility] [address] [place], [place], [address] Phone: [phone]   Fax:  [phone]  Name: Brooke [name] MRN: [id number] Date of Birth: 1951/01/13

## 2019-02-04 ENCOUNTER — Ambulatory Visit: Payer: Medicare Other | Admitting: Physical Therapy

## 2019-02-04 ENCOUNTER — Encounter: Payer: Medicare Other | Admitting: Gynecology

## 2019-02-04 ENCOUNTER — Other Ambulatory Visit: Payer: Self-pay

## 2019-02-04 ENCOUNTER — Encounter: Payer: Self-pay | Admitting: Physical Therapy

## 2019-02-04 DIAGNOSIS — G8929 Other chronic pain: Secondary | ICD-10-CM

## 2019-02-04 DIAGNOSIS — M25552 Pain in left hip: Secondary | ICD-10-CM

## 2019-02-04 DIAGNOSIS — R293 Abnormal posture: Secondary | ICD-10-CM | POA: Diagnosis not present

## 2019-02-04 DIAGNOSIS — M25562 Pain in left knee: Secondary | ICD-10-CM

## 2019-02-04 DIAGNOSIS — M6281 Muscle weakness (generalized): Secondary | ICD-10-CM

## 2019-02-04 DIAGNOSIS — M25551 Pain in right hip: Secondary | ICD-10-CM

## 2019-02-04 NOTE — Therapy (Signed)
[facility] [address] [place], [place], [address] Phone: [phone]   Fax:  [phone]  Physical Therapy Treatment  Patient Details  Name: Brooke [name] MRN: [id number] Date of Birth: [birth year] Referring Provider (PT): Micheline Rough, MD   Encounter Date: 02/04/2019  PT End of Session - 02/04/19 1023    Visit Number  14    Date for PT Re-Evaluation  01/30/19    Authorization Type  UHC Medicare    PT Start Time  1017    PT Stop Time  1058    PT Time Calculation (min)  41 min    Activity Tolerance  Patient tolerated treatment well;Patient limited by pain    Behavior During Therapy  [facility] for tasks assessed/performed       Past Medical History:  Diagnosis Date  . Anemia   . Anxiety   . Arthritis   . ASCUS of cervix with negative high risk HPV 12/2018  . Bilateral dry eyes   . Breast cancer, left breast ([place])   . Family history of breast cancer 02/02/2017  . Family history of breast cancer   . Family history of colon cancer 02/02/2017  . Family history of colon cancer   . Family history of prostate cancer   . Family history of prostate cancer in father 02/02/2017  . Family history of uterine cancer   . Fibroid   . History of kidney stones   . Hyperlipidemia   . Hypertension   . Kidney stones   . Melanoma of upper arm ([place]) 2009   RIGHT ARM   . Obesity (BMI 30.0-34.9)   . Osteopenia 09/2017   T score -1.9 max 8% / 0.8% statistically significant decline at right and left hip stable at the spine  . PONV (postoperative nausea and vomiting)   . Squamous carcinoma     of the skin  . UTI (urinary tract infection)   . Varicose veins of both lower extremities   . Vitamin D deficiency 07/2009   LOW VITAMIN D 29  . Vitamin deficiency 07/2008   VITAMIN D LOW 23    Past Surgical History:  Procedure Laterality Date  . ABDOMINAL SURGERY  1975   ovairan cyst   . APPENDECTOMY    . BREAST LUMPECTOMY WITH  RADIOACTIVE SEED LOCALIZATION Right 09/25/2017   Procedure: RIGHT BREAST LUMPECTOMY WITH RADIOACTIVE SEED LOCALIZATION;  Surgeon: Jovita Kussmaul, MD;  Location: [place];  Service: General;  Laterality: Right;  . BREAST SURGERY     bio  . CHOLECYSTECTOMY    . DERMOID CYST  EXCISION    . DILATION AND CURETTAGE OF UTERUS  1998  . GALLBLADDER SURGERY    . HYSTEROSCOPY    . insertion  port a cath  03/02/2016  . MASTECTOMY MODIFIED RADICAL Left 09/25/2017   Procedure: LEFT MODIFIED RADICAL MASTECTOMY;  Surgeon: Jovita Kussmaul, MD;  Location: [place];  Service: General;  Laterality: Left;  . PORTACATH PLACEMENT N/A 03/02/2017   Procedure: INSERTION PORT-A-CATH;  Surgeon: Jovita Kussmaul, MD;  Location: [place];  Service: General;  Laterality: N/A;  . SKIN BIOPSY    . SKIN CANCER EXCISION  2010,2011   2010-RIGHT ARM, 2011-LEFT LOWER LEG.-DR Rolm Bookbinder DERM. DR. Sarajane Jews IS HER DERM SURGEON.    There were no vitals filed for this visit.  Subjective Assessment - 02/04/19 1020    Subjective  I am doing about the same.  I need to review HEP -  I don't really get what the seated exercises are doing other than the band around my knees.    Pertinent History  Lt breast cancer    Limitations  Standing;Walking    How long can you stand comfortably?  10-15 minutes    How long can you walk comfortably?  5 minutes    Diagnostic tests  none    Patient Stated Goals  begin exercising without incresing knee pain, walk    Currently in Pain?  Yes    Pain Score  4     Pain Location  Knee    Pain Orientation  Right;Left    Pain Descriptors / Indicators  Aching;Sharp    Pain Type  Chronic pain    Pain Onset  More than a month ago    Pain Frequency  Constant    Aggravating Factors   standing, walking >5'    Pain Relieving Factors  sitting, rubbing knees and kneecaps, stretching                       [facility] Adult PT Treatment/Exercise - 02/04/19 0001      Knee/Hip Exercises: Standing   Other Standing  Knee Exercises  review all HEP and update/edit      Knee/Hip Exercises: Supine   Quad Sets  Strengthening;Both;Right;10 reps    Quad Sets Limitations  5 sec hold    Straight Leg Raises  Strengthening;Right;Left;2 sets;5 reps    Straight Leg Raises Limitations  from elevated prop for LEs    Other Supine Knee/Hip Exercises  knee to chest for knee flexion 10x3 sec bil      Modalities   Modalities  Moist Heat      Moist Heat Therapy   Number Minutes Moist Heat  10 Minutes   concurrent with review of HEP   Moist Heat Location  Knee   bil     Manual Therapy   Manual Therapy  Other (comment)    Joint Mobilization  bil patellar mobs with movement sup/lat with flex/ext Gr II/III, Rt knee end range ext tibial ER and femoral post glide    Soft tissue mobilization  bil ITB, infrapatellar Rt, bil vastus lateralis    Other Manual Therapy  contract/relax stretch supine gastroc by PT             PT Education - 02/04/19 1027    Education Details  Access Code: [email or web address] , revised and reviewed    Person(s) Educated  Patient    Methods  Explanation;Demonstration    Comprehension  Verbalized understanding       PT Short Term Goals - 02/04/19 1208      PT SHORT TERM GOAL #1   Title  be independent in initial HEP    Status  Achieved      PT SHORT TERM GOAL #2   Title  initiate a walking program and verbalize understanding of how to progress slowly    Baseline  unable to walk for exercise due to pain with walking >5'    Status  Deferred      PT SHORT TERM GOAL #3   Title  report < or = to 6/10 max Rt LE pain with standing and walking to improve tolerance for home and community    Baseline  consistent pain 4/10 with activity modification    Status  Partially Met      PT SHORT TERM GOAL #4   Title  demonstrate bil knee extension  to lacking < or = to 10 degrees to improve gait pattern    Status  Achieved        PT Long Term Goals - 02/04/19 1209      PT LONG TERM GOAL #1   Title   be independent in advanced HEP    Baseline  slower progress due to multiple high level painful joints in bil LEs, ongoing revisions for tolerance    Status  On-going      PT LONG TERM GOAL #3   Title  perform regular walking or gym exercise and verbalize understanding of safe progression for strength and endurance    Baseline  COVID limiting gym access, pain limits walking x 5'    Status  Deferred      PT LONG TERM GOAL #4   Title  report a 60% reduction in bil knee and Rt hip pain with standing and walking to improve tolerance    Baseline  Pt reports improving mobility and strength but ongoing pain levels    Status  On-going      PT LONG TERM GOAL #5   Title  demonstrate 4+/5 bil knee strength to improve endurance and safety on steps    Baseline  ranges from 4-4+/5    Status  On-going            Plan - 02/04/19 1203    Clinical Impression Statement  PT trialed moist heat to bil knees beginning of session while reviewing/modifying HEP which Pt noted allowed for greater ROM and pain relief throughout session.  PT encouraged Pt to use heat at home prior to HEP and self-mobs of patellae for improved blood flow and tissue extensibility.  PT focused on manual therapy to improve Rt knee end range extension and bil patellar mobility with good response.  Pt achieved greater end range Rt knee extension today achieving ROM goal.  Pt demo'd improved strength today allowing for 2x5 reps of SLR in bil LEs without pain from elevated bolster which Pt previously needed AA/ROM assist for.  PT revised seated program today to streamline HEP for the ther ex Pt is more successful with at home.  Pt will continue to benefit from skilled PT along POC with slower progress expected due to multiple arthritic painful joints.    Comorbidities  osteopenia, HTN, history of breast cancer    Rehab Potential  Good    PT Frequency  2x / week    PT Duration  6 weeks    PT Treatment/Interventions  ADLs/Self Care Home  Management;Cryotherapy;Ultrasound;Moist Heat;Electrical Stimulation;Gait training;Stair training;Balance training;Therapeutic exercise;Therapeutic activities;Functional mobility training;Neuromuscular re-education;Patient/family education;Manual techniques;Passive range of motion;Dry needling;Taping;Spinal Manipulations    PT Next Visit Plan  bil knee heat, continue joint mobs, soft tissue techniques bil LEs, patellar mobs, HEP progression    PT Home Exercise Plan  Access Code: [email or web address]    Consulted and Agree with Plan of Care  Patient       Patient will benefit from skilled therapeutic intervention in order to improve the following deficits and impairments:     Visit Diagnosis: Abnormal posture  Chronic pain of right knee  Muscle weakness (generalized)  Pain in right hip  Chronic pain of left knee  Pain in left hip     Problem List Patient Active Problem List   Diagnosis Date Noted  . Genetic testing 12/14/2017  . Family history of uterine cancer   . Family history of prostate cancer   . Melanoma in situ of  right upper extremity ([place]) 07/18/2017  . Port-A-Cath in place 04/18/2017  . Drug-induced neutropenia ([place]) 03/09/2017  . Family history of breast cancer 02/02/2017  . Family history of colon cancer 02/02/2017  . Family history of prostate cancer in father 02/02/2017  . Malignant neoplasm of overlapping sites of left breast in female, estrogen receptor positive ([place]) 01/31/2017  . Hx of hot flashes, menopausal, HRT in the past 12/19/2011  . Osteopenia, stable, DEXA 2013, recs to repeat in 2018 12/19/2011  . Hx of Hyperlipidemia, LDL 144 in 07/2011 12/19/2011  . Vitamin D insufficiency 12/19/2011  . Fibroid     Baruch Merl, PT 02/04/19 12:12 PM   [facility] [address] [place], [place], [address] Phone: [phone]   Fax:  [phone]  Name: Brooke [name] MRN: [id number] Date of Birth:  [birth year]

## 2019-02-04 NOTE — Patient Instructions (Signed)
Access Code: [email or web address]  URL: [email or web address]  Date: 02/04/2019  Prepared by: Venetia Night Dahlton Hinde   Exercises  Supine Heel Slides - 10 reps - 2 sets - 1x daily - 7x weekly  Hooklying Single Knee to Chest - 3 reps - 1 sets - 20 hold - 3x daily - 7x weekly  Seated Long Arc Quad - 10 reps - 1 sets - 3x daily - 7x weekly  Standing Knee Flexion - 10 reps - 3 sets - 1x daily - 7x weekly  Heel rises with counter support - 20 reps - 2 sets - 1x daily - 7x weekly  Standing Hip Flexion with Counter Support - 10 reps - 3 sets - 1x daily - 7x weekly  Standing Hip Abduction with Counter Support - 10 reps - 3 sets - 1x daily - 7x weekly  Standing Hip Extension with Counter Support - 10 reps - 3 sets - 1x daily - 7x weekly  Squat with Counter Support - 10 reps - 1 sets - 1x daily - 7x weekly  Sit to Stand with Counter Support - 5 reps - 1 sets - 1x daily - 7x weekly  Standing Knee Flexion with Counter Support - 10 reps - 1 sets - 1x daily - 7x weekly  Seated Hip Abduction with Resistance - 10 reps - 2 sets - 1x daily - 7x weekly  Hooklying Isometric Hip Flexion - 5 reps - 1 sets - 5 hold - 1x daily - 7x weekly  Gastroc Stretch on Wall - 10 reps - 3 sets - 30 hold - 1x daily - 7x weekly

## 2019-02-07 ENCOUNTER — Other Ambulatory Visit: Payer: Self-pay

## 2019-02-07 ENCOUNTER — Encounter: Payer: Self-pay | Admitting: Physical Therapy

## 2019-02-07 ENCOUNTER — Ambulatory Visit: Payer: Medicare Other | Admitting: Physical Therapy

## 2019-02-07 DIAGNOSIS — M25551 Pain in right hip: Secondary | ICD-10-CM

## 2019-02-07 DIAGNOSIS — G8929 Other chronic pain: Secondary | ICD-10-CM

## 2019-02-07 DIAGNOSIS — R293 Abnormal posture: Secondary | ICD-10-CM

## 2019-02-07 DIAGNOSIS — M6281 Muscle weakness (generalized): Secondary | ICD-10-CM

## 2019-02-07 DIAGNOSIS — M25552 Pain in left hip: Secondary | ICD-10-CM

## 2019-02-07 NOTE — Therapy (Signed)
[facility] [address], [place], [place], [address] Phone: [phone]   Fax:  [phone]  Physical Therapy Treatment  Patient Details  Name: Brooke [name] MRN: [id number] Date of Birth: 1950/06/23 Referring Provider (PT): Micheline Rough, MD   Encounter Date: 02/07/2019  PT End of Session - 02/07/19 1409    Visit Number  15    Date for PT Re-Evaluation  03/06/19    Authorization Type  [employer] Medicare    PT Start Time  1400    PT Stop Time  1442    PT Time Calculation (min)  42 min    Activity Tolerance  Patient tolerated treatment well;Patient limited by pain    Behavior During Therapy  [facility] for tasks assessed/performed       Past Medical History:  Diagnosis Date  . Anemia   . Anxiety   . Arthritis   . ASCUS of cervix with negative high risk HPV 12/2018  . Bilateral dry eyes   . Breast cancer, left breast ([name])   . Family history of breast cancer 02/02/2017  . Family history of breast cancer   . Family history of colon cancer 02/02/2017  . Family history of colon cancer   . Family history of prostate cancer   . Family history of prostate cancer in father 02/02/2017  . Family history of uterine cancer   . Fibroid   . History of kidney stones   . Hyperlipidemia   . Hypertension   . Kidney stones   . Melanoma of upper arm ([place]) 2009   RIGHT ARM   . Obesity (BMI 30.0-34.9)   . Osteopenia 09/2017   T score -1.9 max 8% / 0.8% statistically significant decline at right and left hip stable at the spine  . PONV (postoperative nausea and vomiting)   . Squamous carcinoma     of the skin  . UTI (urinary tract infection)   . Varicose veins of both lower extremities   . Vitamin D deficiency 07/2009   LOW VITAMIN D 29  . Vitamin deficiency 07/2008   VITAMIN D LOW 23    Past Surgical History:  Procedure Laterality Date  . ABDOMINAL SURGERY  1975   ovairan cyst   . APPENDECTOMY    . BREAST LUMPECTOMY WITH  RADIOACTIVE SEED LOCALIZATION Right 09/25/2017   Procedure: RIGHT BREAST LUMPECTOMY WITH RADIOACTIVE SEED LOCALIZATION;  Surgeon: Jovita Kussmaul, MD;  Location: [place];  Service: General;  Laterality: Right;  . BREAST SURGERY     bio  . CHOLECYSTECTOMY    . DERMOID CYST  EXCISION    . DILATION AND CURETTAGE OF UTERUS  1998  . GALLBLADDER SURGERY    . HYSTEROSCOPY    . insertion  port a cath  03/02/2016  . MASTECTOMY MODIFIED RADICAL Left 09/25/2017   Procedure: LEFT MODIFIED RADICAL MASTECTOMY;  Surgeon: Jovita Kussmaul, MD;  Location: [place];  Service: General;  Laterality: Left;  . PORTACATH PLACEMENT N/A 03/02/2017   Procedure: INSERTION PORT-A-CATH;  Surgeon: Jovita Kussmaul, MD;  Location: [place];  Service: General;  Laterality: N/A;  . SKIN BIOPSY    . SKIN CANCER EXCISION  2010,2011   2010-RIGHT ARM, 2011-LEFT LOWER LEG.-DR Rolm Bookbinder DERM. DR. Sarajane Jews IS HER DERM SURGEON.    There were no vitals filed for this visit.  Subjective Assessment - 02/07/19 1405    Subjective  I have noticed a new catching in the Lt knee with the  long arc quad on the inside of the knee.  It isn't painful but I worry it is going to get stuck.    Pertinent History  Lt breast cancer    Limitations  Standing;Walking    How long can you stand comfortably?  10-15 minutes    How long can you walk comfortably?  5 minutes    Diagnostic tests  none    Patient Stated Goals  begin exercising without incresing knee pain, walk    Currently in Pain?  Yes    Pain Score  4     Pain Location  Knee    Pain Orientation  Right;Left    Pain Descriptors / Indicators  Aching;Sharp    Pain Type  Chronic pain    Pain Onset  More than a month ago    Pain Frequency  Constant    Aggravating Factors   stand, walk >5'    Pain Relieving Factors  sittting, rubbing knees, stretching                       [facility] Adult PT Treatment/Exercise - 02/07/19 0001      Self-Care   Other Self-Care Comments   review of HEP  and mangaement and expectation of knee symptoms, Pt may experience clicking, joint noise, pain with movement through knee ROM due to arthritic changes      Knee/Hip Exercises: Aerobic   Nustep  L2 x 5' PT present to discuss symptoms      Knee/Hip Exercises: Standing   Heel Raises  20 reps    Knee Flexion  Strengthening;Both;5 reps;1 set    Knee Flexion Limitations  limited by pain and weakness    Hip Abduction  Stengthening;Both;1 set;10 reps;Knee straight    Hip Extension  Stengthening;Both;1 set;10 reps;Knee straight    Other Standing Knee Exercises  march from foam pad alt x 20, foot on step 10x5 sec each standing on foam pad      Knee/Hip Exercises: Seated   Long Arc Quad  Strengthening;Both;1 set;10 reps    Sit to [employer]  5 reps;without UE support   from elevated black pad, functional squats     Moist Heat Therapy   Number Minutes Moist Heat  10 Minutes    Moist Heat Location  Knee               PT Short Term Goals - 02/04/19 1208      PT SHORT TERM GOAL #1   Title  be independent in initial HEP    Status  Achieved      PT SHORT TERM GOAL #2   Title  initiate a walking program and verbalize understanding of how to progress slowly    Baseline  unable to walk for exercise due to pain with walking >5'    Status  Deferred      PT SHORT TERM GOAL #3   Title  report < or = to 6/10 max Rt LE pain with standing and walking to improve tolerance for home and community    Baseline  consistent pain 4/10 with activity modification    Status  Partially Met      PT SHORT TERM GOAL #4   Title  demonstrate bil knee extension to lacking < or = to 10 degrees to improve gait pattern    Status  Achieved        PT Long Term Goals - 02/04/19 1209  PT LONG TERM GOAL #1   Title  be independent in advanced HEP    Baseline  slower progress due to multiple high level painful joints in bil LEs, ongoing revisions for tolerance    Status  On-going      PT LONG TERM GOAL #3    Title  perform regular walking or gym exercise and verbalize understanding of safe progression for strength and endurance    Baseline  COVID limiting gym access, pain limits walking x 5'    Status  Deferred      PT LONG TERM GOAL #4   Title  report a 60% reduction in bil knee and Rt hip pain with standing and walking to improve tolerance    Baseline  Pt reports improving mobility and strength but ongoing pain levels    Status  On-going      PT LONG TERM GOAL #5   Title  demonstrate 4+/5 bil knee strength to improve endurance and safety on steps    Baseline  ranges from 4-4+/5    Status  On-going            Plan - 02/07/19 1444    Clinical Impression Statement  Pt with report of Lt medial knee catching during LAQ HEP since last visit.  PT could not replicate today.  PT discussed likelihood of joint noise and potential for pain/catching due to chronic degen changes in knees.  HEP limits full ROM through knees and targets knee strength by way of limited knee flex/ext and focus on hip and ankle strength to stabilize knee and limit pain.  PT reviewed HEP with min need for cueing for form during all standing ther ex.  PT will continue careful monitoring of symptoms and tolerance of ther ex and progress along POC as tolerated.    Comorbidities  osteopenia, HTN, history of breast cancer    Rehab Potential  Good    PT Frequency  2x / week    PT Duration  6 weeks    PT Treatment/Interventions  ADLs/Self Care Home Management;Cryotherapy;Ultrasound;Moist Heat;Electrical Stimulation;Gait training;Stair training;Balance training;Therapeutic exercise;Therapeutic activities;Functional mobility training;Neuromuscular re-education;Patient/family education;Manual techniques;Passive range of motion;Dry needling;Taping;Spinal Manipulations    PT Next Visit Plan  f/u on Lt knee catching, bil knee heat, continue joint mobs, soft tissue techniques bil LEs, patellar mobs, HEP progression    PT Home Exercise Plan   Access Code: [email or web address]    Consulted and Agree with Plan of Care  Patient       Patient will benefit from skilled therapeutic intervention in order to improve the following deficits and impairments:     Visit Diagnosis: Abnormal posture  Chronic pain of right knee  Muscle weakness (generalized)  Pain in right hip  Chronic pain of left knee  Pain in left hip     Problem List Patient Active Problem List   Diagnosis Date Noted  . Genetic testing 12/14/2017  . Family history of uterine cancer   . Family history of prostate cancer   . Melanoma in situ of right upper extremity ([place]) 07/18/2017  . Port-A-Cath in place 04/18/2017  . Drug-induced neutropenia ([place]) 03/09/2017  . Family history of breast cancer 02/02/2017  . Family history of colon cancer 02/02/2017  . Family history of prostate cancer in father 02/02/2017  . Malignant neoplasm of overlapping sites of left breast in female, estrogen receptor positive ([place]) 01/31/2017  . Hx of hot flashes, menopausal, HRT in the past 12/19/2011  . Osteopenia, stable, DEXA 2013,  recs to repeat in 2018 12/19/2011  . Hx of Hyperlipidemia, LDL 144 in 07/2011 12/19/2011  . Vitamin D insufficiency 12/19/2011  . Fibroid    Baruch Merl, PT 02/07/19 2:47 PM   [facility] [address], [place], [place], [address] Phone: [phone]   Fax:  [phone]  Name: Brooke [name] MRN: [id number] Date of Birth: Jul 09, 1950

## 2019-02-08 ENCOUNTER — Ambulatory Visit: Payer: Medicare Other | Admitting: Gynecology

## 2019-02-08 ENCOUNTER — Encounter: Payer: Self-pay | Admitting: Gynecology

## 2019-02-08 ENCOUNTER — Other Ambulatory Visit: Payer: Self-pay

## 2019-02-08 VITALS — BP 138/84

## 2019-02-08 DIAGNOSIS — N950 Postmenopausal bleeding: Secondary | ICD-10-CM | POA: Diagnosis not present

## 2019-02-08 NOTE — Progress Notes (Signed)
    Brooke [name] 1950/12/14 [id number]        68 y.o.  G0P0000 presents with 2 episodes of spotting over the past 24 hours.  Also some vague pelvic cramping.  On tamoxifen.  History of leiomyoma in the past  Past medical history,surgical history, problem list, medications, allergies, family history and social history were all reviewed and documented in the EPIC chart.  Directed ROS with pertinent positives and negatives documented in the history of present illness/assessment and plan.  Exam: Caryn Bee assistant Vitals:   02/08/19 1402  BP: 138/84   General appearance:  Normal Abdomen soft nontender without mass guarding rebound Pelvic external BUS vagina with atrophic changes.  Cervix normal with atrophic changes.  Uterus grossly normal midline mobile nontender.  Adnexa without masses or tenderness.  Assessment/Plan:  68 y.o. G0P0000 with spotting on tamoxifen.  Discussed differential to include hyperplastic changes.  Need for evaluation reviewed.  Exposure somewhat limited by introital opening.  Possible difficulty with sonohysterogram reviewed.  Will start with ultrasound and attempt sonohysterogram.  May proceed with D&C if needed for endometrial assessment all reviewed with the patient.    Anastasio Auerbach MD, 2:48 PM 02/08/2019

## 2019-02-08 NOTE — Patient Instructions (Signed)
Follow-up for the ultrasound as scheduled. 

## 2019-02-10 ENCOUNTER — Encounter: Payer: Self-pay | Admitting: Family Medicine

## 2019-02-10 DIAGNOSIS — C4491 Basal cell carcinoma of skin, unspecified: Secondary | ICD-10-CM | POA: Insufficient documentation

## 2019-02-11 ENCOUNTER — Encounter: Payer: Self-pay | Admitting: Physical Therapy

## 2019-02-11 ENCOUNTER — Ambulatory Visit: Payer: Medicare Other | Admitting: Physical Therapy

## 2019-02-11 ENCOUNTER — Other Ambulatory Visit: Payer: Self-pay

## 2019-02-11 DIAGNOSIS — R293 Abnormal posture: Secondary | ICD-10-CM | POA: Diagnosis not present

## 2019-02-11 DIAGNOSIS — G8929 Other chronic pain: Secondary | ICD-10-CM

## 2019-02-11 DIAGNOSIS — M25561 Pain in right knee: Secondary | ICD-10-CM

## 2019-02-11 DIAGNOSIS — M6281 Muscle weakness (generalized): Secondary | ICD-10-CM

## 2019-02-11 DIAGNOSIS — M25562 Pain in left knee: Secondary | ICD-10-CM

## 2019-02-11 NOTE — Therapy (Signed)
[facility] [address], [place], [place], [address] Phone: [phone]   Fax:  [phone]  Physical Therapy Treatment  Patient Details  Name: Brooke [name] MRN: [id number] Date of Birth: [birth year] Referring Provider (PT): Micheline Rough, MD   Encounter Date: 02/11/2019  PT End of Session - 02/11/19 1102    Visit Number  16    Date for PT Re-Evaluation  03/06/19    Authorization Type  UHC Medicare    PT Start Time  5361    PT Stop Time  1100    PT Time Calculation (min)  45 min    Activity Tolerance  Patient tolerated treatment well;Patient limited by pain    Behavior During Therapy  [name] for tasks assessed/performed       Past Medical History:  Diagnosis Date  . Anemia   . Anxiety   . Arthritis   . ASCUS of cervix with negative high risk HPV 12/2018  . Bilateral dry eyes   . Breast cancer, left breast ([place])   . Family history of breast cancer 02/02/2017  . Family history of breast cancer   . Family history of colon cancer 02/02/2017  . Family history of colon cancer   . Family history of prostate cancer   . Family history of prostate cancer in father 02/02/2017  . Family history of uterine cancer   . Fibroid   . History of kidney stones   . Hyperlipidemia   . Hypertension   . Kidney stones   . Melanoma of upper arm ([place]) 2009   RIGHT ARM   . Obesity (BMI 30.0-34.9)   . Osteopenia 09/2017   T score -1.9 max 8% / 0.8% statistically significant decline at right and left hip stable at the spine  . PONV (postoperative nausea and vomiting)   . Squamous carcinoma     of the skin  . UTI (urinary tract infection)   . Varicose veins of both lower extremities   . Vitamin D deficiency 07/2009   LOW VITAMIN D 29  . Vitamin deficiency 07/2008   VITAMIN D LOW 23    Past Surgical History:  Procedure Laterality Date  . ABDOMINAL SURGERY  1975   ovairan cyst   . APPENDECTOMY    . BREAST LUMPECTOMY WITH  RADIOACTIVE SEED LOCALIZATION Right 09/25/2017   Procedure: RIGHT BREAST LUMPECTOMY WITH RADIOACTIVE SEED LOCALIZATION;  Surgeon: Jovita Kussmaul, MD;  Location: [place];  Service: General;  Laterality: Right;  . BREAST SURGERY     bio  . CHOLECYSTECTOMY    . DERMOID CYST  EXCISION    . DILATION AND CURETTAGE OF UTERUS  1998  . GALLBLADDER SURGERY    . HYSTEROSCOPY    . insertion  port a cath  03/02/2016  . MASTECTOMY MODIFIED RADICAL Left 09/25/2017   Procedure: LEFT MODIFIED RADICAL MASTECTOMY;  Surgeon: Jovita Kussmaul, MD;  Location: [place];  Service: General;  Laterality: Left;  . PORTACATH PLACEMENT N/A 03/02/2017   Procedure: INSERTION PORT-A-CATH;  Surgeon: Jovita Kussmaul, MD;  Location: [place];  Service: General;  Laterality: N/A;  . SKIN BIOPSY    . SKIN CANCER EXCISION  2010,2011   2010-RIGHT ARM, 2011-LEFT LOWER LEG.-DR Rolm Bookbinder DERM. DR. Sarajane Jews IS HER DERM SURGEON.    There were no vitals filed for this visit.  Subjective Assessment - 02/11/19 1023    Subjective  Lt knee is still catching with exercise but not as much of  a threat that it will stick and stay stuck. Bil knees are aching.    Pertinent History  Lt breast cancer    Limitations  Standing;Walking    How long can you stand comfortably?  10-15 minutes    How long can you walk comfortably?  5 minutes    Diagnostic tests  none    Patient Stated Goals  begin exercising without incresing knee pain, walk    Currently in Pain?  Yes    Pain Score  4     Pain Location  Knee    Pain Orientation  Right;Left;Anterior    Pain Descriptors / Indicators  Aching;Sharp    Pain Type  Chronic pain    Pain Onset  More than a month ago    Pain Frequency  Constant    Aggravating Factors   stand, walking >5'    Pain Relieving Factors  rubbing knees                       [facility] Adult PT Treatment/Exercise - 02/11/19 0001      Exercises   Exercises  Knee/Hip      Knee/Hip Exercises: Aerobic   Nustep  L2 x 5', PT  present to discuss symptoms      Knee/Hip Exercises: Supine   Other Supine Knee/Hip Exercises  feet on ball bil knee flex ext x 15 reps AROM      Manual Therapy   Manual Therapy  Joint mobilization;Soft tissue mobilization;Passive ROM    Joint Mobilization  Lt patellofemoral glides all directions Gr II-IV, Lt tib fib joint a/p glides Gr II/III, Rt patellar glides all dir Gr II-IV    Soft tissue mobilization  Lt ITB, peroneals, sup/lat gastroc, bil peripatellar    Passive ROM  bil knee flex/ext               PT Short Term Goals - 02/04/19 1208      PT SHORT TERM GOAL #1   Title  be independent in initial HEP    Status  Achieved      PT SHORT TERM GOAL #2   Title  initiate a walking program and verbalize understanding of how to progress slowly    Baseline  unable to walk for exercise due to pain with walking >5'    Status  Deferred      PT SHORT TERM GOAL #3   Title  report < or = to 6/10 max Rt LE pain with standing and walking to improve tolerance for home and community    Baseline  consistent pain 4/10 with activity modification    Status  Partially Met      PT SHORT TERM GOAL #4   Title  demonstrate bil knee extension to lacking < or = to 10 degrees to improve gait pattern    Status  Achieved        PT Long Term Goals - 02/04/19 1209      PT LONG TERM GOAL #1   Title  be independent in advanced HEP    Baseline  slower progress due to multiple high level painful joints in bil LEs, ongoing revisions for tolerance    Status  On-going      PT LONG TERM GOAL #3   Title  perform regular walking or gym exercise and verbalize understanding of safe progression for strength and endurance    Baseline  COVID limiting gym access, pain limits walking x 5'  Status  Deferred      PT LONG TERM GOAL #4   Title  report a 60% reduction in bil knee and Rt hip pain with standing and walking to improve tolerance    Baseline  Pt reports improving mobility and strength but  ongoing pain levels    Status  On-going      PT LONG TERM GOAL #5   Title  demonstrate 4+/5 bil knee strength to improve endurance and safety on steps    Baseline  ranges from 4-4+/5    Status  On-going            Plan - 02/11/19 1259    Clinical Impression Statement  Pt has admittedly gotten away from self-mob and rolling of bil knees and thigh soft tissues which were tight and tender today.  PT with manual focused session today for manual techniques to address joint and soft tissue restrictions in bil knees.  PT continues to discuss chronic pain management strategies including eventual trial of aquatic PT/exercise given signif pain in bil knees which limits activity tolerance.  PT continues to desire conservative management and is motivated despite activity restrictions due to pain.  PT continues to alternate manual focus and movement/ther ex focused pattern of appointments to address bil LE joint and soft tissue dysfunctions.  Pt will continue to benefit from ongoing progression and manual techniques for ROM, flexibility and strength in bil LEs with slower progress due to advanced degenerative changes in bil knees.    Comorbidities  osteopenia, HTN, history of breast cancer    Rehab Potential  Good    PT Frequency  2x / week    PT Duration  6 weeks    PT Treatment/Interventions  ADLs/Self Care Home Management;Cryotherapy;Ultrasound;Moist Heat;Electrical Stimulation;Gait training;Stair training;Balance training;Therapeutic exercise;Therapeutic activities;Functional mobility training;Neuromuscular re-education;Patient/family education;Manual techniques;Passive range of motion;Dry needling;Taping;Spinal Manipulations    PT Next Visit Plan  ther ex as tol, manual as needed    PT Home Exercise Plan  Access Code: [email or web address]    Consulted and Agree with Plan of Care  Patient       Patient will benefit from skilled therapeutic intervention in order to improve the following deficits and  impairments:     Visit Diagnosis: Abnormal posture  Chronic pain of right knee  Muscle weakness (generalized)  Chronic pain of left knee     Problem List Patient Active Problem List   Diagnosis Date Noted  . Superficial basal cell carcinoma (BCC) 02/10/2019  . Genetic testing 12/14/2017  . Family history of uterine cancer   . Family history of prostate cancer   . Melanoma in situ of right upper extremity ([place]) 07/18/2017  . Port-A-Cath in place 04/18/2017  . Drug-induced neutropenia ([place]) 03/09/2017  . Family history of breast cancer 02/02/2017  . Family history of colon cancer 02/02/2017  . Family history of prostate cancer in father 02/02/2017  . Malignant neoplasm of overlapping sites of left breast in female, estrogen receptor positive ([place]) 01/31/2017  . Hx of hot flashes, menopausal, HRT in the past 12/19/2011  . Osteopenia, stable, DEXA 2013, recs to repeat in 2018 12/19/2011  . Hx of Hyperlipidemia, LDL 144 in 07/2011 12/19/2011  . Vitamin D insufficiency 12/19/2011  . Fibroid    Baruch Merl, PT 02/11/19 1:06 PM   [facility] [address] [place], [place], [address] Phone: [phone]   Fax:  [phone]  Name: Brooke [name] MRN: [id number] Date of Birth: [birth year]

## 2019-02-14 ENCOUNTER — Ambulatory Visit: Payer: Medicare Other | Admitting: Physical Therapy

## 2019-02-14 ENCOUNTER — Encounter: Payer: Self-pay | Admitting: Physical Therapy

## 2019-02-14 ENCOUNTER — Other Ambulatory Visit: Payer: Self-pay

## 2019-02-14 DIAGNOSIS — M25561 Pain in right knee: Secondary | ICD-10-CM

## 2019-02-14 DIAGNOSIS — M6281 Muscle weakness (generalized): Secondary | ICD-10-CM

## 2019-02-14 DIAGNOSIS — G8929 Other chronic pain: Secondary | ICD-10-CM

## 2019-02-14 DIAGNOSIS — M25562 Pain in left knee: Secondary | ICD-10-CM

## 2019-02-14 DIAGNOSIS — R293 Abnormal posture: Secondary | ICD-10-CM | POA: Diagnosis not present

## 2019-02-14 DIAGNOSIS — M25551 Pain in right hip: Secondary | ICD-10-CM

## 2019-02-14 DIAGNOSIS — M25552 Pain in left hip: Secondary | ICD-10-CM

## 2019-02-14 NOTE — Therapy (Signed)
[facility] [address] [place], [place], [address] Phone: [phone]   Fax:  [phone]  Physical Therapy Treatment  Patient Details  Name: Brooke [name] MRN: [id number] Date of Birth: August 03, 1950 Referring Provider (PT): Micheline Rough, MD   Encounter Date: 02/14/2019  PT End of Session - 02/14/19 1404    Visit Number  17    Date for PT Re-Evaluation  03/06/19    Authorization Type  [employer] Medicare    PT Start Time  1400    PT Stop Time  1444    PT Time Calculation (min)  44 min    Activity Tolerance  Patient tolerated treatment well;Patient limited by pain    Behavior During Therapy  [facility] for tasks assessed/performed       Past Medical History:  Diagnosis Date  . Anemia   . Anxiety   . Arthritis   . ASCUS of cervix with negative high risk HPV 12/2018  . Bilateral dry eyes   . Breast cancer, left breast ([place])   . Family history of breast cancer 02/02/2017  . Family history of breast cancer   . Family history of colon cancer 02/02/2017  . Family history of colon cancer   . Family history of prostate cancer   . Family history of prostate cancer in father 02/02/2017  . Family history of uterine cancer   . Fibroid   . History of kidney stones   . Hyperlipidemia   . Hypertension   . Kidney stones   . Melanoma of upper arm ([place]) 2009   RIGHT ARM   . Obesity (BMI 30.0-34.9)   . Osteopenia 09/2017   T score -1.9 max 8% / 0.8% statistically significant decline at right and left hip stable at the spine  . PONV (postoperative nausea and vomiting)   . Squamous carcinoma     of the skin  . UTI (urinary tract infection)   . Varicose veins of both lower extremities   . Vitamin D deficiency 07/2009   LOW VITAMIN D 29  . Vitamin deficiency 07/2008   VITAMIN D LOW 23    Past Surgical History:  Procedure Laterality Date  . ABDOMINAL SURGERY  1975   ovairan cyst   . APPENDECTOMY    . BREAST LUMPECTOMY WITH  RADIOACTIVE SEED LOCALIZATION Right 09/25/2017   Procedure: RIGHT BREAST LUMPECTOMY WITH RADIOACTIVE SEED LOCALIZATION;  Surgeon: Jovita Kussmaul, MD;  Location: [facility];  Service: General;  Laterality: Right;  . BREAST SURGERY     bio  . CHOLECYSTECTOMY    . DERMOID CYST  EXCISION    . DILATION AND CURETTAGE OF UTERUS  1998  . GALLBLADDER SURGERY    . HYSTEROSCOPY    . insertion  port a cath  03/02/2016  . MASTECTOMY MODIFIED RADICAL Left 09/25/2017   Procedure: LEFT MODIFIED RADICAL MASTECTOMY;  Surgeon: Jovita Kussmaul, MD;  Location: [facility];  Service: General;  Laterality: Left;  . PORTACATH PLACEMENT N/A 03/02/2017   Procedure: INSERTION PORT-A-CATH;  Surgeon: Jovita Kussmaul, MD;  Location: [facility];  Service: General;  Laterality: N/A;  . SKIN BIOPSY    . SKIN CANCER EXCISION  2010,2011   2010-RIGHT ARM, 2011-LEFT LOWER LEG.-DR Rolm Bookbinder DERM. DR. Sarajane Jews IS HER DERM SURGEON.    There were no vitals filed for this visit.  Subjective Assessment - 02/14/19 1401    Subjective  A few medial knee catches on Lt knee since last here.  Soreness continues in outside of Lt knee and front of Rt knee.  I have gotten back to using rolling pin and massaging knee caps.    Pertinent History  Lt breast cancer    Limitations  Standing;Walking    How long can you stand comfortably?  10-15 minutes    How long can you walk comfortably?  5 minutes    Diagnostic tests  none    Patient Stated Goals  begin exercising without incresing knee pain, walk    Currently in Pain?  Yes    Pain Score  4     Pain Location  Knee    Pain Orientation  Right;Left;Anterior;Medial;Lateral    Pain Descriptors / Indicators  Aching;Sharp    Pain Type  Chronic pain    Pain Onset  More than a month ago    Pain Frequency  Constant    Aggravating Factors   stand, walk >5'    Pain Relieving Factors  rubbing knees, heat                       [facility] Adult PT Treatment/Exercise - 02/14/19 0001      Neuro Re-ed     Neuro Re-ed Details   imagery release for adductors in propped butterfly, legs propped on couch for gluteals, progress to stretch as tol      Knee/Hip Exercises: Stretches   Piriformis Stretch  Left;30 seconds    Other Knee/Hip Stretches  supine SKTC 2x30 on Lt    Other Knee/Hip Stretches  butterfly stretch supine bil 2x30      Knee/Hip Exercises: Aerobic   Nustep  L1 x 6', PT present to discuss symptoms and progress, management of pain at home      Knee/Hip Exercises: Standing   Heel Raises  Both;20 reps    Hip Flexion  Knee straight;Stengthening;3 sets;5 reps    Hip Abduction  Both;3 sets;5 reps;Knee straight    Hip Extension  3 sets;5 reps;Knee straight;Both      Knee/Hip Exercises: Seated   Clamshell with TheraBand  --   black band, 15, seated on black foam pad   Marching Limitations  isometric 5x5 march with UE resistance      Manual Therapy   Joint Mobilization  Lt hip distraction with posterior hip trigger point release    Soft tissue mobilization  Lt adductors               PT Short Term Goals - 02/04/19 1208      PT SHORT TERM GOAL #1   Title  be independent in initial HEP    Status  Achieved      PT SHORT TERM GOAL #2   Title  initiate a walking program and verbalize understanding of how to progress slowly    Baseline  unable to walk for exercise due to pain with walking >5'    Status  Deferred      PT SHORT TERM GOAL #3   Title  report < or = to 6/10 max Rt LE pain with standing and walking to improve tolerance for home and community    Baseline  consistent pain 4/10 with activity modification    Status  Partially Met      PT SHORT TERM GOAL #4   Title  demonstrate bil knee extension to lacking < or = to 10 degrees to improve gait pattern    Status  Achieved  PT Long Term Goals - 02/04/19 1209      PT LONG TERM GOAL #1   Title  be independent in advanced HEP    Baseline  slower progress due to multiple high level painful joints in bil  LEs, ongoing revisions for tolerance    Status  On-going      PT LONG TERM GOAL #3   Title  perform regular walking or gym exercise and verbalize understanding of safe progression for strength and endurance    Baseline  COVID limiting gym access, pain limits walking x 5'    Status  Deferred      PT LONG TERM GOAL #4   Title  report a 60% reduction in bil knee and Rt hip pain with standing and walking to improve tolerance    Baseline  Pt reports improving mobility and strength but ongoing pain levels    Status  On-going      PT LONG TERM GOAL #5   Title  demonstrate 4+/5 bil knee strength to improve endurance and safety on steps    Baseline  ranges from 4-4+/5    Status  On-going            Plan - 02/14/19 1445    Clinical Impression Statement  PT alternated standing and sitting ther ex to accommodate bil knee pain with standing today.  Pt with signif muscle guarding and cramp that developed in Lt gluteals and adductors with standing ther ex.  PT performed hip distraction, release techniques and coached self-release positioning and imagery progressing into stretching as tol to manage these symptoms at home.  She had relief and resolution of Lt hip pain end of session.  Pt with slow progress and limited standing exercise tolerance due to signif pain levels in bil knees.  She continues to learn accommodations and pain relief techniques through exercise, ROM and stretching with PT to help her maximize her activity tolerance for improved QOL.  Continue along POC for ROM, strengthening as tol with accommodations, manual techniques and stretching.    Comorbidities  osteopenia, HTN, history of breast cancer    PT Treatment/Interventions  ADLs/Self Care Home Management;Cryotherapy;Ultrasound;Moist Heat;Electrical Stimulation;Gait training;Stair training;Balance training;Therapeutic exercise;Therapeutic activities;Functional mobility training;Neuromuscular re-education;Patient/family  education;Manual techniques;Passive range of motion;Dry needling;Taping;Spinal Manipulations    PT Next Visit Plan  ther ex as tol, manual as needed    PT Home Exercise Plan  Access Code: [email or web address]    Consulted and Agree with Plan of Care  Patient       Patient will benefit from skilled therapeutic intervention in order to improve the following deficits and impairments:     Visit Diagnosis: Abnormal posture  Chronic pain of right knee  Muscle weakness (generalized)  Chronic pain of left knee  Pain in right hip  Pain in left hip     Problem List Patient Active Problem List   Diagnosis Date Noted  . Superficial basal cell carcinoma (BCC) 02/10/2019  . Genetic testing 12/14/2017  . Family history of uterine cancer   . Family history of prostate cancer   . Melanoma in situ of right upper extremity ([place]) 07/18/2017  . Port-A-Cath in place 04/18/2017  . Drug-induced neutropenia ([place]) 03/09/2017  . Family history of breast cancer 02/02/2017  . Family history of colon cancer 02/02/2017  . Family history of prostate cancer in father 02/02/2017  . Malignant neoplasm of overlapping sites of left breast in female, estrogen receptor positive ([place]) 01/31/2017  . Hx of hot flashes, menopausal,  HRT in the past 12/19/2011  . Osteopenia, stable, DEXA 2013, recs to repeat in 2018 12/19/2011  . Hx of Hyperlipidemia, LDL 144 in 07/2011 12/19/2011  . Vitamin D insufficiency 12/19/2011  . Fibroid     Baruch Merl, PT 02/14/19 5:58 PM   [facility] [address] [place], [place], [address] Phone: [phone]   Fax:  [phone]  Name: LIESA TSAN MRN: [id number] Date of Birth: 1950-08-25

## 2019-02-18 ENCOUNTER — Ambulatory Visit: Payer: Medicare Other | Admitting: Physical Therapy

## 2019-02-18 ENCOUNTER — Encounter: Payer: Self-pay | Admitting: Physical Therapy

## 2019-02-18 ENCOUNTER — Other Ambulatory Visit: Payer: Self-pay

## 2019-02-18 DIAGNOSIS — M6281 Muscle weakness (generalized): Secondary | ICD-10-CM

## 2019-02-18 DIAGNOSIS — R293 Abnormal posture: Secondary | ICD-10-CM

## 2019-02-18 DIAGNOSIS — G8929 Other chronic pain: Secondary | ICD-10-CM

## 2019-02-18 DIAGNOSIS — M25562 Pain in left knee: Secondary | ICD-10-CM

## 2019-02-18 NOTE — Therapy (Signed)
[facility] [address] [place], [place], [address] Phone: [phone]   Fax:  [phone]  Physical Therapy Treatment  Patient Details  Name: Brooke [name] MRN: [id number] Date of Birth: 19-Jun-1950 Referring Provider (PT): Micheline Rough, MD   Encounter Date: 02/18/2019  PT End of Session - 02/18/19 0943    Visit Number  18    Date for PT Re-Evaluation  03/06/19    Authorization Type  [employer]HC Medicare    PT Start Time  0937    PT Stop Time  1020    PT Time Calculation (min)  43 min    Activity Tolerance  Patient tolerated treatment well;Patient limited by pain    Behavior During Therapy  [facility] for tasks assessed/performed       Past Medical History:  Diagnosis Date  . Anemia   . Anxiety   . Arthritis   . ASCUS of cervix with negative high risk HPV 12/2018  . Bilateral dry eyes   . Breast cancer, left breast ([place])   . Family history of breast cancer 02/02/2017  . Family history of breast cancer   . Family history of colon cancer 02/02/2017  . Family history of colon cancer   . Family history of prostate cancer   . Family history of prostate cancer in father 02/02/2017  . Family history of uterine cancer   . Fibroid   . History of kidney stones   . Hyperlipidemia   . Hypertension   . Kidney stones   . Melanoma of upper arm ([place]) 2009   RIGHT ARM   . Obesity (BMI 30.0-34.9)   . Osteopenia 09/2017   T score -1.9 max 8% / 0.8% statistically significant decline at right and left hip stable at the spine  . PONV (postoperative nausea and vomiting)   . Squamous carcinoma     of the skin  . UTI (urinary tract infection)   . Varicose veins of both lower extremities   . Vitamin D deficiency 07/2009   LOW VITAMIN D 29  . Vitamin deficiency 07/2008   VITAMIN D LOW 23    Past Surgical History:  Procedure Laterality Date  . ABDOMINAL SURGERY  1975   ovairan cyst   . APPENDECTOMY    . BREAST LUMPECTOMY WITH  RADIOACTIVE SEED LOCALIZATION Right 09/25/2017   Procedure: RIGHT BREAST LUMPECTOMY WITH RADIOACTIVE SEED LOCALIZATION;  Surgeon: Jovita Kussmaul, MD;  Location: [facility];  Service: General;  Laterality: Right;  . BREAST SURGERY     bio  . CHOLECYSTECTOMY    . DERMOID CYST  EXCISION    . DILATION AND CURETTAGE OF UTERUS  1998  . GALLBLADDER SURGERY    . HYSTEROSCOPY    . insertion  port a cath  03/02/2016  . MASTECTOMY MODIFIED RADICAL Left 09/25/2017   Procedure: LEFT MODIFIED RADICAL MASTECTOMY;  Surgeon: Jovita Kussmaul, MD;  Location: [facility];  Service: General;  Laterality: Left;  . PORTACATH PLACEMENT N/A 03/02/2017   Procedure: INSERTION PORT-A-CATH;  Surgeon: Jovita Kussmaul, MD;  Location: [facility];  Service: General;  Laterality: N/A;  . SKIN BIOPSY    . SKIN CANCER EXCISION  2010,2011   2010-RIGHT ARM, 2011-LEFT LOWER LEG.-DR Rolm Bookbinder DERM. DR. Sarajane Jews IS HER DERM SURGEON.    There were no vitals filed for this visit.  Subjective Assessment - 02/18/19 0941    Subjective  I aggravated my knees doing laundry at laundromat standing x 2 hours.  Lt knee gets so stiff and I had to use my hands to get my leg into car afterwards.    Pertinent History  Lt breast cancer    Limitations  Standing;Walking    How long can you stand comfortably?  10-15 minutes    How long can you walk comfortably?  5 minutes    Diagnostic tests  none    Patient Stated Goals  begin exercising without incresing knee pain, walk    Currently in Pain?  Yes    Pain Score  4     Pain Location  Knee    Pain Orientation  Right;Left;Anterior;Lateral    Pain Descriptors / Indicators  Aching;Sharp    Pain Type  Chronic pain    Pain Onset  More than a month ago    Pain Frequency  Constant    Aggravating Factors   stand, walk >5'    Pain Relieving Factors  rubbing knees, heat                       [facility] Adult PT Treatment/Exercise - 02/18/19 0001      Knee/Hip Exercises: Stretches   Gastroc Stretch   Both;2 reps;30 seconds    Gastroc Stretch Limitations  slant board      Knee/Hip Exercises: Aerobic   Nustep  L2 x 6', PT present to monitor response and cue ongoing movement      Knee/Hip Exercises: Standing   Hip Flexion  Stengthening;Both;5 sets;2 sets;Knee straight      Knee/Hip Exercises: Supine   Quad Sets  Strengthening;Both;10 reps      Moist Heat Therapy   Number Minutes Moist Heat  10 Minutes   concurrent with contralateral manual PT   Moist Heat Location  Knee      Manual Therapy   Manual Therapy  Joint mobilization;Soft tissue mobilization;Passive ROM    Joint Mobilization  bil patellar mobs all direcitons end range Gr IV    Soft tissue mobilization  patellar tendon bil, med/lat joint lines bil, ITB Lt    Passive ROM  bil knee flexion/extension painfree range               PT Short Term Goals - 02/04/19 1208      PT SHORT TERM GOAL #1   Title  be independent in initial HEP    Status  Achieved      PT SHORT TERM GOAL #2   Title  initiate a walking program and verbalize understanding of how to progress slowly    Baseline  unable to walk for exercise due to pain with walking >5'    Status  Deferred      PT SHORT TERM GOAL #3   Title  report < or = to 6/10 max Rt LE pain with standing and walking to improve tolerance for home and community    Baseline  consistent pain 4/10 with activity modification    Status  Partially Met      PT SHORT TERM GOAL #4   Title  demonstrate bil knee extension to lacking < or = to 10 degrees to improve gait pattern    Status  Achieved        PT Long Term Goals - 02/18/19 1236      PT LONG TERM GOAL #1   Title  be independent in advanced HEP    Baseline  slower progress due to multiple high level painful joints in bil LEs, ongoing revisions  for tolerance    Status  On-going      PT LONG TERM GOAL #3   Title  perform regular walking or gym exercise and verbalize understanding of safe progression for strength and  endurance    Baseline  COVID limiting gym access, pain limits walking x 5'    Status  Deferred      PT LONG TERM GOAL #4   Title  report a 60% reduction in bil knee and Rt hip pain with standing and walking to improve tolerance    Baseline  Pt reports improving mobility and strength but ongoing pain levels    Status  On-going      PT LONG TERM GOAL #5   Title  demonstrate 4+/5 bil knee strength to improve endurance and safety on steps    Baseline  ranges from 4-4+/5    Status  On-going            Plan - 02/18/19 1232    Clinical Impression Statement  Pt continues to have ongoing chronic pain in bil knees which signif limits her activity level.  Pain is constant and increases with standing and walking >5 min.  PT continues to discuss options for pain management including aquatic PT which Pt is not comfortable with given COVID.  PT explained that we will likely hit a plateau in progress in early Jan given limits of pain on ther ex tolerance.  PT continues to coach Pt on tools for pain management including soft tissue massage with rolling pin, self-mobs for patellae, and compliance with HEP as tol.  Explained other levels of intervention that a doctor might be able to evaluate her for should she choose to go that route.  Pt will benefit from skilled PT to finalize max benefit of manual techniques and HEP.    Comorbidities  osteopenia, HTN, history of breast cancer    Examination-Activity Limitations  Locomotion Level;Stand;Stairs;Squat;Transfers    Rehab Potential  Good    PT Frequency  2x / week    PT Duration  6 weeks    PT Treatment/Interventions  ADLs/Self Care Home Management;Cryotherapy;Ultrasound;Moist Heat;Electrical Stimulation;Gait training;Stair training;Balance training;Therapeutic exercise;Therapeutic activities;Functional mobility training;Neuromuscular re-education;Patient/family education;Manual techniques;Passive range of motion;Dry needling;Taping;Spinal Manipulations     PT Next Visit Plan  ther ex as tol, manual as needed    PT Home Exercise Plan  Access Code: [email or web address]    Consulted and Agree with Plan of Care  Patient       Patient will benefit from skilled therapeutic intervention in order to improve the following deficits and impairments:     Visit Diagnosis: Abnormal posture  Chronic pain of right knee  Muscle weakness (generalized)  Chronic pain of left knee     Problem List Patient Active Problem List   Diagnosis Date Noted  . Superficial basal cell carcinoma (BCC) 02/10/2019  . Genetic testing 12/14/2017  . Family history of uterine cancer   . Family history of prostate cancer   . Melanoma in situ of right upper extremity ([place]) 07/18/2017  . Port-A-Cath in place 04/18/2017  . Drug-induced neutropenia ([place]) 03/09/2017  . Family history of breast cancer 02/02/2017  . Family history of colon cancer 02/02/2017  . Family history of prostate cancer in father 02/02/2017  . Malignant neoplasm of overlapping sites of left breast in female, estrogen receptor positive ([place]) 01/31/2017  . Hx of hot flashes, menopausal, HRT in the past 12/19/2011  . Osteopenia, stable, DEXA 2013, recs to repeat in 2018 12/19/2011  .  Hx of Hyperlipidemia, LDL 144 in 07/2011 12/19/2011  . Vitamin D insufficiency 12/19/2011  . Fibroid      Baruch Merl, PT 02/18/19 12:38 PM   [facility] [address], [place], [place], [address] Phone: [phone]   Fax:  [phone]  Name: LAQUIA ROSANO MRN: [id number] Date of Birth: 03-Mar-1950

## 2019-02-20 ENCOUNTER — Ambulatory Visit: Payer: Medicare Other | Admitting: Physical Therapy

## 2019-02-20 ENCOUNTER — Other Ambulatory Visit: Payer: Self-pay

## 2019-02-20 ENCOUNTER — Encounter: Payer: Self-pay | Admitting: Physical Therapy

## 2019-02-20 DIAGNOSIS — M6281 Muscle weakness (generalized): Secondary | ICD-10-CM

## 2019-02-20 DIAGNOSIS — G8929 Other chronic pain: Secondary | ICD-10-CM

## 2019-02-20 DIAGNOSIS — M25552 Pain in left hip: Secondary | ICD-10-CM

## 2019-02-20 DIAGNOSIS — M25562 Pain in left knee: Secondary | ICD-10-CM

## 2019-02-20 DIAGNOSIS — R293 Abnormal posture: Secondary | ICD-10-CM

## 2019-02-20 NOTE — Therapy (Signed)
[facility] [address], [place], [place], [address] Phone: [phone]   Fax:  [phone]  Physical Therapy Treatment  Patient Details  Name: Brooke [name] MRN: [id number] Date of Birth: Jul 03, 1950 Referring Provider (PT): Micheline Rough, MD   Encounter Date: 02/20/2019  PT End of Session - 02/20/19 0940    Visit Number  19    Date for PT Re-Evaluation  03/06/19    Authorization Type  UHC Medicare    PT Start Time  0930    PT Stop Time  1015    PT Time Calculation (min)  45 min    Activity Tolerance  Patient tolerated treatment well;Patient limited by pain    Behavior During Therapy  [facility] for tasks assessed/performed       Past Medical History:  Diagnosis Date  . Anemia   . Anxiety   . Arthritis   . ASCUS of cervix with negative high risk HPV 12/2018  . Bilateral dry eyes   . Breast cancer, left breast ([place])   . Family history of breast cancer 02/02/2017  . Family history of breast cancer   . Family history of colon cancer 02/02/2017  . Family history of colon cancer   . Family history of prostate cancer   . Family history of prostate cancer in father 02/02/2017  . Family history of uterine cancer   . Fibroid   . History of kidney stones   . Hyperlipidemia   . Hypertension   . Kidney stones   . Melanoma of upper arm ([place]) 2009   RIGHT ARM   . Obesity (BMI 30.0-34.9)   . Osteopenia 09/2017   T score -1.9 max 8% / 0.8% statistically significant decline at right and left hip stable at the spine  . PONV (postoperative nausea and vomiting)   . Squamous carcinoma     of the skin  . UTI (urinary tract infection)   . Varicose veins of both lower extremities   . Vitamin D deficiency 07/2009   LOW VITAMIN D 29  . Vitamin deficiency 07/2008   VITAMIN D LOW 23    Past Surgical History:  Procedure Laterality Date  . ABDOMINAL SURGERY  1975   ovairan cyst   . APPENDECTOMY    . BREAST LUMPECTOMY WITH  RADIOACTIVE SEED LOCALIZATION Right 09/25/2017   Procedure: RIGHT BREAST LUMPECTOMY WITH RADIOACTIVE SEED LOCALIZATION;  Surgeon: Jovita Kussmaul, MD;  Location: [place];  Service: General;  Laterality: Right;  . BREAST SURGERY     bio  . CHOLECYSTECTOMY    . DERMOID CYST  EXCISION    . DILATION AND CURETTAGE OF UTERUS  1998  . GALLBLADDER SURGERY    . HYSTEROSCOPY    . insertion  port a cath  03/02/2016  . MASTECTOMY MODIFIED RADICAL Left 09/25/2017   Procedure: LEFT MODIFIED RADICAL MASTECTOMY;  Surgeon: Jovita Kussmaul, MD;  Location: [place];  Service: General;  Laterality: Left;  . PORTACATH PLACEMENT N/A 03/02/2017   Procedure: INSERTION PORT-A-CATH;  Surgeon: Jovita Kussmaul, MD;  Location: [place];  Service: General;  Laterality: N/A;  . SKIN BIOPSY    . SKIN CANCER EXCISION  2010,2011   2010-RIGHT ARM, 2011-LEFT LOWER LEG.-DR Rolm Bookbinder DERM. DR. Sarajane Jews IS HER DERM SURGEON.    There were no vitals filed for this visit.  Subjective Assessment - 02/20/19 0935    Subjective  I have been too busy to get my HEP in given the  holidays.  My knees are about the same.    Pertinent History  Lt breast cancer    Limitations  Standing;Walking    How long can you stand comfortably?  10-15 minutes    How long can you walk comfortably?  5 minutes    Diagnostic tests  none    Patient Stated Goals  begin exercising without incresing knee pain, walk    Currently in Pain?  Yes    Pain Score  4     Pain Location  Knee    Pain Orientation  Right;Left;Anterior;Lateral    Pain Descriptors / Indicators  Aching;Sharp    Pain Type  Chronic pain    Pain Onset  More than a month ago    Pain Frequency  Constant    Aggravating Factors   stand, walk x 5'    Pain Relieving Factors  rubbing knees, heat                       [facility] Adult PT Treatment/Exercise - 02/20/19 0001      Lumbar Exercises: Supine   Clam  5 reps    Clam Limitations  5 pulses wide green band x 5 cycles    Other Supine  Lumbar Exercises  march with green band around thighs x 20 reps      Knee/Hip Exercises: Stretches   Gastroc Stretch  Both;2 reps;30 seconds    Gastroc Stretch Limitations  slant board      Knee/Hip Exercises: Aerobic   Nustep  L2 x 6', PT present to discuss progress and future activity level      Knee/Hip Exercises: Standing   Hip Flexion  Stengthening;2 sets;5 reps;Knee straight    Hip Abduction  Both;2 sets;5 reps;Knee straight    Hip Extension  2 sets;5 reps;Knee straight      Knee/Hip Exercises: Supine   Short Arc Quad Sets  AROM;Strengthening;Both;10 reps    Short Arc Quad Sets Limitations  5 sec hold quad set    Other Supine Knee/Hip Exercises  DKTC feet on green ball with knee hug to chest for bil knee A/ROM    Other Supine Knee/Hip Exercises  90/90 hamstring isom bil 10x5 sec feet on green ball      Moist Heat Therapy   Number Minutes Moist Heat  10 Minutes   PT present to discuss symptoms and management   Moist Heat Location  Knee   bil              PT Short Term Goals - 02/04/19 1208      PT SHORT TERM GOAL #1   Title  be independent in initial HEP    Status  Achieved      PT SHORT TERM GOAL #2   Title  initiate a walking program and verbalize understanding of how to progress slowly    Baseline  unable to walk for exercise due to pain with walking >5'    Status  Deferred      PT SHORT TERM GOAL #3   Title  report < or = to 6/10 max Rt LE pain with standing and walking to improve tolerance for home and community    Baseline  consistent pain 4/10 with activity modification    Status  Partially Met      PT SHORT TERM GOAL #4   Title  demonstrate bil knee extension to lacking < or = to 10 degrees to improve gait pattern  Status  Achieved        PT Long Term Goals - 02/18/19 1236      PT LONG TERM GOAL #1   Title  be independent in advanced HEP    Baseline  slower progress due to multiple high level painful joints in bil LEs, ongoing revisions  for tolerance    Status  On-going      PT LONG TERM GOAL #3   Title  perform regular walking or gym exercise and verbalize understanding of safe progression for strength and endurance    Baseline  COVID limiting gym access, pain limits walking x 5'    Status  Deferred      PT LONG TERM GOAL #4   Title  report a 60% reduction in bil knee and Rt hip pain with standing and walking to improve tolerance    Baseline  Pt reports improving mobility and strength but ongoing pain levels    Status  On-going      PT LONG TERM GOAL #5   Title  demonstrate 4+/5 bil knee strength to improve endurance and safety on steps    Baseline  ranges from 4-4+/5    Status  On-going            Plan - 02/20/19 1005    Clinical Impression Statement  PT trialed heat to bil knees prior to ther ex which helped Pt's tolerance of exercise and ROM today.  PT continues to coach Pt on exercise and managment of chronic pain in bil arthritic knees.  She continues to be limited in standing and walking x 5' due to knee pain.  PT continues to modify HEP for tolerance for knee stability, flexibility and strength.  Pt will continue to benefit from skilled PT with anticipation of d/c in early Jan.    Comorbidities  osteopenia, HTN, history of breast cancer    Rehab Potential  Good    PT Frequency  2x / week    PT Duration  6 weeks    PT Treatment/Interventions  ADLs/Self Care Home Management;Cryotherapy;Ultrasound;Moist Heat;Electrical Stimulation;Gait training;Stair training;Balance training;Therapeutic exercise;Therapeutic activities;Functional mobility training;Neuromuscular re-education;Patient/family education;Manual techniques;Passive range of motion;Dry needling;Taping;Spinal Manipulations    PT Next Visit Plan  ther ex as tol, manual as needed    PT Home Exercise Plan  Access Code: [email or web address]    Consulted and Agree with Plan of Care  Patient       Patient will benefit from skilled therapeutic intervention in order to  improve the following deficits and impairments:     Visit Diagnosis: Abnormal posture  Chronic pain of right knee  Muscle weakness (generalized)  Chronic pain of left knee  Pain in left hip     Problem List Patient Active Problem List   Diagnosis Date Noted  . Superficial basal cell carcinoma (BCC) 02/10/2019  . Genetic testing 12/14/2017  . Family history of uterine cancer   . Family history of prostate cancer   . Melanoma in situ of right upper extremity ([place]) 07/18/2017  . Port-A-Cath in place 04/18/2017  . Drug-induced neutropenia ([place]) 03/09/2017  . Family history of breast cancer 02/02/2017  . Family history of colon cancer 02/02/2017  . Family history of prostate cancer in father 02/02/2017  . Malignant neoplasm of overlapping sites of left breast in female, estrogen receptor positive ([place]) 01/31/2017  . Hx of hot flashes, menopausal, HRT in the past 12/19/2011  . Osteopenia, stable, DEXA 2013, recs to repeat in 2018 12/19/2011  . Hx  of Hyperlipidemia, LDL 144 in 07/2011 12/19/2011  . Vitamin D insufficiency 12/19/2011  . Fibroid     Baruch Merl, PT 02/20/19 10:09 AM   [facility] [address], [place], [place], [address] Phone: [phone]   Fax:  [phone]  Name: MIOSOTIS WETSEL MRN: [id number] Date of Birth: Mar 09, 1950

## 2019-02-25 ENCOUNTER — Ambulatory Visit: Payer: Medicare Other | Admitting: Physical Therapy

## 2019-02-25 ENCOUNTER — Encounter: Payer: Self-pay | Admitting: Physical Therapy

## 2019-02-25 ENCOUNTER — Other Ambulatory Visit: Payer: Self-pay

## 2019-02-25 DIAGNOSIS — M25552 Pain in left hip: Secondary | ICD-10-CM

## 2019-02-25 DIAGNOSIS — M25551 Pain in right hip: Secondary | ICD-10-CM

## 2019-02-25 DIAGNOSIS — M6281 Muscle weakness (generalized): Secondary | ICD-10-CM

## 2019-02-25 DIAGNOSIS — R293 Abnormal posture: Secondary | ICD-10-CM

## 2019-02-25 DIAGNOSIS — G8929 Other chronic pain: Secondary | ICD-10-CM

## 2019-02-25 NOTE — Therapy (Signed)
[facility] [address], [place], [place], [address] Phone: [phone]   Fax:  [phone]  Physical Therapy Treatment  Patient Details  Name: Brooke [name] MRN: [id number] Date of Birth: 14-Oct-1950 Referring Provider (PT): Micheline Rough, MD  Progress Note Reporting Period 01/14/19  to 02/25/19  See note below for Objective Data and Assessment of Progress/Goals.   See below    Encounter Date: 02/25/2019  PT End of Session - 02/25/19 0939    Visit Number  20    Date for PT Re-Evaluation  03/06/19    Authorization Type  UHC Medicare    PT Start Time  0935    PT Stop Time  1017    PT Time Calculation (min)  42 min    Activity Tolerance  Patient tolerated treatment well;Patient limited by pain    Behavior During Therapy  [facility] for tasks assessed/performed       Past Medical History:  Diagnosis Date  . Anemia   . Anxiety   . Arthritis   . ASCUS of cervix with negative high risk HPV 12/2018  . Bilateral dry eyes   . Breast cancer, left breast ([place])   . Family history of breast cancer 02/02/2017  . Family history of breast cancer   . Family history of colon cancer 02/02/2017  . Family history of colon cancer   . Family history of prostate cancer   . Family history of prostate cancer in father 02/02/2017  . Family history of uterine cancer   . Fibroid   . History of kidney stones   . Hyperlipidemia   . Hypertension   . Kidney stones   . Melanoma of upper arm ([place]) 2009   RIGHT ARM   . Obesity (BMI 30.0-34.9)   . Osteopenia 09/2017   T score -1.9 max 8% / 0.8% statistically significant decline at right and left hip stable at the spine  . PONV (postoperative nausea and vomiting)   . Squamous carcinoma     of the skin  . UTI (urinary tract infection)   . Varicose veins of both lower extremities   . Vitamin D deficiency 07/2009   LOW VITAMIN D 29  . Vitamin deficiency 07/2008   VITAMIN D LOW 23     Past Surgical History:  Procedure Laterality Date  . ABDOMINAL SURGERY  1975   ovairan cyst   . APPENDECTOMY    . BREAST LUMPECTOMY WITH RADIOACTIVE SEED LOCALIZATION Right 09/25/2017   Procedure: RIGHT BREAST LUMPECTOMY WITH RADIOACTIVE SEED LOCALIZATION;  Surgeon: Jovita Kussmaul, MD;  Location: [place];  Service: General;  Laterality: Right;  . BREAST SURGERY     bio  . CHOLECYSTECTOMY    . DERMOID CYST  EXCISION    . DILATION AND CURETTAGE OF UTERUS  1998  . GALLBLADDER SURGERY    . HYSTEROSCOPY    . insertion  port a cath  03/02/2016  . MASTECTOMY MODIFIED RADICAL Left 09/25/2017   Procedure: LEFT MODIFIED RADICAL MASTECTOMY;  Surgeon: Jovita Kussmaul, MD;  Location: [place];  Service: General;  Laterality: Left;  . PORTACATH PLACEMENT N/A 03/02/2017   Procedure: INSERTION PORT-A-CATH;  Surgeon: Jovita Kussmaul, MD;  Location: [place];  Service: General;  Laterality: N/A;  . SKIN BIOPSY    . SKIN CANCER EXCISION  2010,2011   2010-RIGHT ARM, 2011-LEFT LOWER LEG.-DR Rolm Bookbinder DERM. DR. Sarajane Jews IS HER DERM SURGEON.    There were no vitals filed for  this visit.  Subjective Assessment - 02/25/19 0938    Subjective  Knees are about the same.  I'm using the tools to try to manage.    Pertinent History  Lt breast cancer    Limitations  Standing;Walking    How long can you stand comfortably?  10-15 minutes    How long can you walk comfortably?  5 minutes    Diagnostic tests  none    Patient Stated Goals  begin exercising without incresing knee pain, walk    Currently in Pain?  Yes    Pain Score  4     Pain Location  Knee    Pain Orientation  Left;Right    Pain Descriptors / Indicators  Aching;Sharp    Pain Type  Chronic pain    Pain Onset  More than a month ago    Aggravating Factors   stand, walk x 5'    Pain Relieving Factors  rubbing knees, heat         [facility] PT Assessment - 02/25/19 0001      Assessment   Medical Diagnosis  athralgia, unspecified joint, chronic pain of  Rt knee, trochanteric bursitis Rt    Referring Provider (PT)  Micheline Rough, MD    Onset Date/Surgical Date  11/05/18    Next MD Visit  as needed     Prior Therapy  none      Observation/Other Assessments   Focus on Therapeutic Outcomes (FOTO)   53%      AROM   Overall AROM   Deficits    Right Knee Extension  10    Right Knee Flexion  119    Left Knee Extension  -3    Left Knee Flexion  118      Strength   Overall Strength  Deficits    Right Hip Flexion  4-/5    Right Hip External Rotation   4/5    Right Hip Internal Rotation  4/5    Right Hip ABduction  4/5    Left Hip Flexion  4-/5    Left Hip External Rotation  4+/5    Left Hip Internal Rotation  4+/5    Left Hip ABduction  4+/5    Right Knee Flexion  4+/5    Right Knee Extension  4-/5    Left Knee Flexion  4+/5    Left Knee Extension  4/5      Palpation   Patella mobility  improving all direcitons bil    Palpation comment  tender bil med and lat joint lines, patellar tendon, Lt distal ITB                   [facility] Adult PT Treatment/Exercise - 02/25/19 0001      Lumbar Exercises: Aerobic   Nustep  L2x6' PT present to discuss progress      Knee/Hip Exercises: Stretches   Other Knee/Hip Stretches  feet on green ball rolls in/out for knee flexion supine x10 reps      Knee/Hip Exercises: Supine   Other Supine Knee/Hip Exercises  10x5 sec ham isom feet on ball knees at 90 deg    Other Supine Knee/Hip Exercises  march and clam with green band 1x20 each      Moist Heat Therapy   Number Minutes Moist Heat  10 Minutes    Moist Heat Location  Knee   concurrent with manual therapy     Manual Therapy   Joint Mobilization  bil  patellar mobs Gr III all directions    Soft tissue mobilization  spikey roller bil quads and ITB by PT               PT Short Term Goals - 02/04/19 1208      PT SHORT TERM GOAL #1   Title  be independent in initial HEP    Status  Achieved      PT SHORT TERM GOAL #2    Title  initiate a walking program and verbalize understanding of how to progress slowly    Baseline  unable to walk for exercise due to pain with walking >5'    Status  Deferred      PT SHORT TERM GOAL #3   Title  report < or = to 6/10 max Rt LE pain with standing and walking to improve tolerance for home and community    Baseline  consistent pain 4/10 with activity modification    Status  Partially Met      PT SHORT TERM GOAL #4   Title  demonstrate bil knee extension to lacking < or = to 10 degrees to improve gait pattern    Status  Achieved        PT Long Term Goals - 02/18/19 1236      PT LONG TERM GOAL #1   Title  be independent in advanced HEP    Baseline  slower progress due to multiple high level painful joints in bil LEs, ongoing revisions for tolerance    Status  On-going      PT LONG TERM GOAL #3   Title  perform regular walking or gym exercise and verbalize understanding of safe progression for strength and endurance    Baseline  COVID limiting gym access, pain limits walking x 5'    Status  Deferred      PT LONG TERM GOAL #4   Title  report a 60% reduction in bil knee and Rt hip pain with standing and walking to improve tolerance    Baseline  Pt reports improving mobility and strength but ongoing pain levels    Status  On-going      PT LONG TERM GOAL #5   Title  demonstrate 4+/5 bil knee strength to improve endurance and safety on steps    Baseline  ranges from 4-4+/5    Status  On-going            Plan - 02/25/19 1012    Clinical Impression Statement  Pt does better with heat and manual techniques prior to ther ex with regards to range and ability to do ther ex.  She is understanding the need for pain mangement with tools via self-massage, mobs of kneecaps and ther ex.  She will be d/c'd to HEP next week.    Comorbidities  osteopenia, HTN, history of breast cancer    Stability/Clinical Decision Making  Evolving/Moderate complexity    Rehab Potential   Good    PT Frequency  2x / week    PT Duration  6 weeks    PT Treatment/Interventions  ADLs/Self Care Home Management;Cryotherapy;Ultrasound;Moist Heat;Electrical Stimulation;Gait training;Stair training;Balance training;Therapeutic exercise;Therapeutic activities;Functional mobility training;Neuromuscular re-education;Patient/family education;Manual techniques;Passive range of motion;Dry needling;Taping;Spinal Manipulations    PT Next Visit Plan  ther ex as tol, manual as needed    PT Home Exercise Plan  Access Code: [email or web address]    Consulted and Agree with Plan of Care  Patient       Patient will benefit from  skilled therapeutic intervention in order to improve the following deficits and impairments:     Visit Diagnosis: Abnormal posture  Chronic pain of right knee  Muscle weakness (generalized)  Chronic pain of left knee  Pain in left hip  Pain in right hip     Problem List Patient Active Problem List   Diagnosis Date Noted  . Superficial basal cell carcinoma (BCC) 02/10/2019  . Genetic testing 12/14/2017  . Family history of uterine cancer   . Family history of prostate cancer   . Melanoma in situ of right upper extremity ([place]) 07/18/2017  . Port-A-Cath in place 04/18/2017  . Drug-induced neutropenia ([place]) 03/09/2017  . Family history of breast cancer 02/02/2017  . Family history of colon cancer 02/02/2017  . Family history of prostate cancer in father 02/02/2017  . Malignant neoplasm of overlapping sites of left breast in female, estrogen receptor positive ([place]) 01/31/2017  . Hx of hot flashes, menopausal, HRT in the past 12/19/2011  . Osteopenia, stable, DEXA 2013, recs to repeat in 2018 12/19/2011  . Hx of Hyperlipidemia, LDL 144 in 07/2011 12/19/2011  . Vitamin D insufficiency 12/19/2011  . Fibroid     Baruch Merl, PT 02/25/19 10:15 AM   [facility] [address] [place], [place],  [address] Phone: [phone]   Fax:  [phone]  Name: Brooke [name] MRN: [id number] Date of Birth: 1950-05-12

## 2019-02-26 ENCOUNTER — Other Ambulatory Visit: Payer: Self-pay

## 2019-02-27 ENCOUNTER — Encounter: Payer: Self-pay | Admitting: Gynecology

## 2019-02-27 ENCOUNTER — Other Ambulatory Visit: Payer: Self-pay | Admitting: Gynecology

## 2019-02-27 ENCOUNTER — Ambulatory Visit: Payer: Medicare Other | Admitting: Gynecology

## 2019-02-27 ENCOUNTER — Ambulatory Visit: Payer: Medicare Other

## 2019-02-27 VITALS — BP 132/72 | Ht 68.25 in | Wt 230.0 lb

## 2019-02-27 DIAGNOSIS — Z853 Personal history of malignant neoplasm of breast: Secondary | ICD-10-CM | POA: Diagnosis not present

## 2019-02-27 DIAGNOSIS — N950 Postmenopausal bleeding: Secondary | ICD-10-CM

## 2019-02-27 DIAGNOSIS — R9389 Abnormal findings on diagnostic imaging of other specified body structures: Secondary | ICD-10-CM | POA: Diagnosis not present

## 2019-02-27 NOTE — Progress Notes (Signed)
    ILMA LIMBACH [birth year] [id number]        68 y.o.  G0P0000 presents for sonohysterogram with a history of spotting on tamoxifen.  Prior history of hysteroscopy D&C 1998 for bleeding showing benign endometrial polyps.  Past medical history,surgical history, problem list, medications, allergies, family history and social history were all reviewed and documented in the EPIC chart.  Directed ROS with pertinent positives and negatives documented in the history of present illness/assessment and plan.  Exam: Vitals:   02/27/19 1020  BP: 132/72  Weight: 230 lb (104.3 kg)  Height: 5' 8.25" (1.734 m)   General appearance:  Normal  Ultrasound transvaginal shows uterus retroverted with 3 myomas measuring 48 mm pedunculated at the fundus, 24 mm intramural and 13 mm subserosal.  Endometrial echo 14 mm with multiple cystic changes.  3D ultrasound suggests 32 x 27 mm endometrial mass with feeder vessel identified.  Left ovary atrophic in appearance.  Right ovary with 2 small simple cysts 15 mm and 6 mm.  Questionable hydrosalpinx adjacent to the ovary measuring 10 x 46 mm.  No free fluid.  Assessment/Plan:  68 y.o. G0P0000 with history of spotting on tamoxifen.  Ultrasound shows thickened endometrial echo with cystic changes and feeder vessel suggesting endometrial polyp.  Reviewed findings with patient and differential to include benign, atypical and endometrial carcinoma.  Recommend proceeding with hysteroscopy D&C.  The patient has had this procedure in 1998 showing benign endometrial polyps.  She is familiar with the procedure.  She understands that I am retiring and is in agreement to arrange this with Dr Dellis Filbert.  We will go ahead and schedule her for surgery with a preoperative appointment with Dr Dellis Filbert before hand.    Anastasio Auerbach MD, 11:24 AM 02/27/2019

## 2019-02-27 NOTE — Patient Instructions (Signed)
Office will call you to schedule the hysteroscopy D&C and appointment with Dr Dellis Filbert

## 2019-02-28 ENCOUNTER — Telehealth: Payer: Self-pay

## 2019-02-28 NOTE — Telephone Encounter (Signed)
Left message for patient to call me regarding scheduling.

## 2019-03-04 ENCOUNTER — Other Ambulatory Visit: Payer: Self-pay

## 2019-03-04 ENCOUNTER — Telehealth: Payer: Self-pay

## 2019-03-04 ENCOUNTER — Encounter: Payer: Self-pay | Admitting: Physical Therapy

## 2019-03-04 ENCOUNTER — Ambulatory Visit: Payer: Medicare PPO | Attending: Family Medicine | Admitting: Physical Therapy

## 2019-03-04 DIAGNOSIS — R293 Abnormal posture: Secondary | ICD-10-CM | POA: Diagnosis not present

## 2019-03-04 DIAGNOSIS — M25562 Pain in left knee: Secondary | ICD-10-CM | POA: Diagnosis not present

## 2019-03-04 DIAGNOSIS — G8929 Other chronic pain: Secondary | ICD-10-CM | POA: Insufficient documentation

## 2019-03-04 DIAGNOSIS — M25552 Pain in left hip: Secondary | ICD-10-CM | POA: Insufficient documentation

## 2019-03-04 DIAGNOSIS — M25561 Pain in right knee: Secondary | ICD-10-CM | POA: Diagnosis not present

## 2019-03-04 DIAGNOSIS — M25551 Pain in right hip: Secondary | ICD-10-CM | POA: Diagnosis not present

## 2019-03-04 DIAGNOSIS — M6281 Muscle weakness (generalized): Secondary | ICD-10-CM | POA: Diagnosis not present

## 2019-03-04 NOTE — Telephone Encounter (Signed)
Patient called with additional questions regarding scheduling surgery.  Questions answered. She will consider and call when ready to schedule.

## 2019-03-04 NOTE — Therapy (Signed)
[facility] [address], [address] [place], [place], [address] Phone: [phone]   Fax:  [phone]  Physical Therapy Treatment  Patient Details  Name: Brooke [name] MRN: [id number] Date of Birth: 04/02/1950 Referring Provider (PT): Koberlein, Junell, MD   Encounter Date: 03/04/2019  PT End of Session - 03/04/19 0944    Visit Number  21    Date for PT Re-Evaluation  03/06/19    Authorization Type  [employer] Medicare    PT Start Time  0939   Pt late   PT Stop Time  1017    PT Time Calculation (min)  38 min    Activity Tolerance  Patient tolerated treatment well;Patient limited by pain    Behavior During Therapy  WFL for tasks assessed/performed       Past Medical History:  Diagnosis Date  . Anemia   . Anxiety   . Arthritis   . ASCUS of cervix with negative high risk HPV 12/2018  . Bilateral dry eyes   . Breast cancer, left breast (HCC)   . Family history of breast cancer 02/02/2017  . Family history of breast cancer   . Family history of colon cancer 02/02/2017  . Family history of colon cancer   . Family history of prostate cancer   . Family history of prostate cancer in father 02/02/2017  . Family history of uterine cancer   . Fibroid   . History of kidney stones   . Hyperlipidemia   . Hypertension   . Kidney stones   . Melanoma of upper arm (HCC) 2009   RIGHT ARM   . Obesity (BMI 30.0-34.9)   . Osteopenia 09/2017   T score -1.9 max 8% / 0.8% statistically significant decline at right and left hip stable at the spine  . PONV (postoperative nausea and vomiting)   . Squamous carcinoma     of the skin  . UTI (urinary tract infection)   . Varicose veins of both lower extremities   . Vitamin D deficiency 07/2009   LOW VITAMIN D 29  . Vitamin deficiency 07/2008   VITAMIN D LOW 23    Past Surgical History:  Procedure Laterality Date  . ABDOMINAL SURGERY  1975   ovairan cyst   . APPENDECTOMY    . BREAST  LUMPECTOMY WITH RADIOACTIVE SEED LOCALIZATION Right 09/25/2017   Procedure: RIGHT BREAST LUMPECTOMY WITH RADIOACTIVE SEED LOCALIZATION;  Surgeon: Toth, Paul III, MD;  Location: [facility];  Service: General;  Laterality: Right;  . BREAST SURGERY     bio  . CHOLECYSTECTOMY    . DERMOID CYST  EXCISION    . DILATION AND CURETTAGE OF UTERUS  1998  . GALLBLADDER SURGERY    . HYSTEROSCOPY    . insertion  port a cath  03/02/2016  . MASTECTOMY MODIFIED RADICAL Left 09/25/2017   Procedure: LEFT MODIFIED RADICAL MASTECTOMY;  Surgeon: Toth, Paul III, MD;  Location: [facility];  Service: General;  Laterality: Left;  . PORTACATH PLACEMENT N/A 03/02/2017   Procedure: INSERTION PORT-A-CATH;  Surgeon: Toth, Paul III, MD;  Location: [facility];  Service: General;  Laterality: N/A;  . SKIN BIOPSY    . SKIN CANCER EXCISION  2010,2011   2010-RIGHT ARM, 2011-LEFT LOWER LEG.-DR LAURA LOMAX DERM. DR. GOODRICH IS HER DERM SURGEON.    There were no vitals filed for this visit.  Subjective Assessment - 03/04/19 0941    Subjective  The knees are more noisy and the Lt one   continues to hit a hinge point that I have to work around.  Pain is about the same.    Pertinent History  Lt breast cancer    Limitations  Standing;Walking    How long can you stand comfortably?  10-15 minutes    How long can you walk comfortably?  5 minutes    Diagnostic tests  none    Patient Stated Goals  begin exercising without incresing knee pain, walk    Currently in Pain?  Yes    Pain Score  4     Pain Location  Knee    Pain Orientation  Left;Right    Pain Descriptors / Indicators  Aching;Sharp    Pain Type  Chronic pain    Pain Onset  More than a month ago    Pain Frequency  Constant    Aggravating Factors   stand walk x 5'    Pain Relieving Factors  rubbing knees, heat                       [facility] Adult PT Treatment/Exercise - 03/04/19 0001      Knee/Hip Exercises: Supine   Other Supine Knee/Hip Exercises  feet on ball bil  knee flex/ext x 20 reps      Moist Heat Therapy   Number Minutes Moist Heat  10 Minutes    Moist Heat Location  Knee   bil knees, concurrent with spikey roller     Manual Therapy   Joint Mobilization  bil patellar mobs Gr III all directions    Soft tissue mobilization  spikey roller bil quads and ITB by PT, peri-patellar bil, distal ITB    Passive ROM  bil knee flexion supine               PT Short Term Goals - 02/04/19 1208      PT SHORT TERM GOAL #1   Title  be independent in initial HEP    Status  Achieved      PT SHORT TERM GOAL #2   Title  initiate a walking program and verbalize understanding of how to progress slowly    Baseline  unable to walk for exercise due to pain with walking >5'    Status  Deferred      PT SHORT TERM GOAL #3   Title  report < or = to 6/10 max Rt LE pain with standing and walking to improve tolerance for home and community    Baseline  consistent pain 4/10 with activity modification    Status  Partially Met      PT SHORT TERM GOAL #4   Title  demonstrate bil knee extension to lacking < or = to 10 degrees to improve gait pattern    Status  Achieved        PT Long Term Goals - 02/18/19 1236      PT LONG TERM GOAL #1   Title  be independent in advanced HEP    Baseline  slower progress due to multiple high level painful joints in bil LEs, ongoing revisions for tolerance    Status  On-going      PT LONG TERM GOAL #3   Title  perform regular walking or gym exercise and verbalize understanding of safe progression for strength and endurance    Baseline  COVID limiting gym access, pain limits walking x 5'    Status  Deferred      PT LONG TERM GOAL #  4   Title  report a 60% reduction in bil knee and Rt hip pain with standing and walking to improve tolerance    Baseline  Pt reports improving mobility and strength but ongoing pain levels    Status  On-going      PT LONG TERM GOAL #5   Title  demonstrate 4+/5 bil knee strength to improve  endurance and safety on steps    Baseline  ranges from 4-4+/5    Status  On-going            Plan - 03/04/19 1253    Clinical Impression Statement  PT noted improved carry over of bil patellar mobility Lt>Rt from last visit maintained today.  Pt continues to have chronic pain at level 4/10 in bil knees which severely limit her standing and walking tolerance to approx 5'.  She has learned tools for management of mobility, progression of strength and self-massage/mobilization through PT.  PT discussed possible benefit of aquatic PT or further work up by MD in the future for ongoing knee pain despite PT.  Knees are stiff and noisy suggesting signif arthritic changes.  Plan to review and finalize HEP next visit and d/c with follow up as needed.    Comorbidities  osteopenia, HTN, history of breast cancer    Rehab Potential  Good    PT Frequency  2x / week    PT Duration  6 weeks    PT Treatment/Interventions  ADLs/Self Care Home Management;Cryotherapy;Ultrasound;Moist Heat;Electrical Stimulation;Gait training;Stair training;Balance training;Therapeutic exercise;Therapeutic activities;Functional mobility training;Neuromuscular re-education;Patient/family education;Manual techniques;Passive range of motion;Dry needling;Taping;Spinal Manipulations    PT Next Visit Plan  ther ex as tol, manual as needed    PT Home Exercise Plan  Access Code: [email or web address]    Consulted and Agree with Plan of Care  Patient       Patient will benefit from skilled therapeutic intervention in order to improve the following deficits and impairments:     Visit Diagnosis: Abnormal posture  Chronic pain of right knee  Muscle weakness (generalized)  Chronic pain of left knee  Pain in left hip  Pain in right hip     Problem List Patient Active Problem List   Diagnosis Date Noted  . Superficial basal cell carcinoma (BCC) 02/10/2019  . Genetic testing 12/14/2017  . Family history of uterine cancer   . Family  history of prostate cancer   . Melanoma in situ of right upper extremity ([place]) 07/18/2017  . Port-A-Cath in place 04/18/2017  . Drug-induced neutropenia ([place]) 03/09/2017  . Family history of breast cancer 02/02/2017  . Family history of colon cancer 02/02/2017  . Family history of prostate cancer in father 02/02/2017  . Malignant neoplasm of overlapping sites of left breast in female, estrogen receptor positive ([place]) 01/31/2017  . Hx of hot flashes, menopausal, HRT in the past 12/19/2011  . Osteopenia, stable, DEXA 2013, recs to repeat in 2018 12/19/2011  . Hx of Hyperlipidemia, LDL 144 in 07/2011 12/19/2011  . Vitamin D insufficiency 12/19/2011  . Fibroid    Baruch Merl, PT 03/04/19 12:57 PM   [facility] [address] [place], [place], [address] Phone: [phone]   Fax:  [phone]  Name: Brooke [name] MRN: [id number] Date of Birth: 1951-02-24

## 2019-03-06 ENCOUNTER — Encounter: Payer: Self-pay | Admitting: Physical Therapy

## 2019-03-06 ENCOUNTER — Ambulatory Visit: Payer: Medicare PPO | Admitting: Physical Therapy

## 2019-03-06 ENCOUNTER — Other Ambulatory Visit: Payer: Self-pay

## 2019-03-06 DIAGNOSIS — M25551 Pain in right hip: Secondary | ICD-10-CM | POA: Diagnosis not present

## 2019-03-06 DIAGNOSIS — M6281 Muscle weakness (generalized): Secondary | ICD-10-CM | POA: Diagnosis not present

## 2019-03-06 DIAGNOSIS — M25561 Pain in right knee: Secondary | ICD-10-CM | POA: Diagnosis not present

## 2019-03-06 DIAGNOSIS — G8929 Other chronic pain: Secondary | ICD-10-CM | POA: Diagnosis not present

## 2019-03-06 DIAGNOSIS — R293 Abnormal posture: Secondary | ICD-10-CM | POA: Diagnosis not present

## 2019-03-06 DIAGNOSIS — M25552 Pain in left hip: Secondary | ICD-10-CM | POA: Diagnosis not present

## 2019-03-06 DIAGNOSIS — M25562 Pain in left knee: Secondary | ICD-10-CM | POA: Diagnosis not present

## 2019-03-06 NOTE — Therapy (Signed)
[facility] [address] [place], [place], [address] Phone: [phone]   Fax:  [phone]  Physical Therapy Treatment  Patient Details  Name: Brooke [name] MRN: [id number] Date of Birth: 04/25/1950 Referring Provider (PT): Micheline Rough, MD   Encounter Date: 03/06/2019  PT End of Session - 03/06/19 0942    Visit Number  22    Date for PT Re-Evaluation  03/06/19    Authorization Type  [employer]HC Medicare    PT Start Time  0935    PT Stop Time  1020    PT Time Calculation (min)  45 min    Activity Tolerance  Patient tolerated treatment well;Patient limited by pain    Behavior During Therapy  [facility] for tasks assessed/performed       Past Medical History:  Diagnosis Date  . Anemia   . Anxiety   . Arthritis   . ASCUS of cervix with negative high risk HPV 12/2018  . Bilateral dry eyes   . Breast cancer, left breast ([place])   . Family history of breast cancer 02/02/2017  . Family history of breast cancer   . Family history of colon cancer 02/02/2017  . Family history of colon cancer   . Family history of prostate cancer   . Family history of prostate cancer in father 02/02/2017  . Family history of uterine cancer   . Fibroid   . History of kidney stones   . Hyperlipidemia   . Hypertension   . Kidney stones   . Melanoma of upper arm ([place]) 2009   RIGHT ARM   . Obesity (BMI 30.0-34.9)   . Osteopenia 09/2017   T score -1.9 max 8% / 0.8% statistically significant decline at right and left hip stable at the spine  . PONV (postoperative nausea and vomiting)   . Squamous carcinoma     of the skin  . UTI (urinary tract infection)   . Varicose veins of both lower extremities   . Vitamin D deficiency 07/2009   LOW VITAMIN D 29  . Vitamin deficiency 07/2008   VITAMIN D LOW 23    Past Surgical History:  Procedure Laterality Date  . ABDOMINAL SURGERY  1975   ovairan cyst   . APPENDECTOMY    . BREAST LUMPECTOMY WITH  RADIOACTIVE SEED LOCALIZATION Right 09/25/2017   Procedure: RIGHT BREAST LUMPECTOMY WITH RADIOACTIVE SEED LOCALIZATION;  Surgeon: Jovita Kussmaul, MD;  Location: [facility];  Service: General;  Laterality: Right;  . BREAST SURGERY     bio  . CHOLECYSTECTOMY    . DERMOID CYST  EXCISION    . DILATION AND CURETTAGE OF UTERUS  1998  . GALLBLADDER SURGERY    . HYSTEROSCOPY    . insertion  port a cath  03/02/2016  . MASTECTOMY MODIFIED RADICAL Left 09/25/2017   Procedure: LEFT MODIFIED RADICAL MASTECTOMY;  Surgeon: Jovita Kussmaul, MD;  Location: [facility];  Service: General;  Laterality: Left;  . PORTACATH PLACEMENT N/A 03/02/2017   Procedure: INSERTION PORT-A-CATH;  Surgeon: Jovita Kussmaul, MD;  Location: [facility];  Service: General;  Laterality: N/A;  . SKIN BIOPSY    . SKIN CANCER EXCISION  2010,2011   2010-RIGHT ARM, 2011-LEFT LOWER LEG.-DR Rolm Bookbinder DERM. DR. Sarajane Jews IS HER DERM SURGEON.    There were no vitals filed for this visit.  Subjective Assessment - 03/06/19 0939    Subjective  I'm looking into a gym to transition to after this.  I've  learned a lot and PT has really helped but I continue to have signif knee pain in both knees.    Pertinent History  Lt breast cancer    Limitations  Standing;Walking    How long can you stand comfortably?  10-15 minutes    How long can you walk comfortably?  5 minutes    Diagnostic tests  none    Patient Stated Goals  begin exercising without incresing knee pain, walk    Currently in Pain?  Yes    Pain Score  4     Pain Location  Knee    Pain Orientation  Right;Left    Pain Descriptors / Indicators  Aching;Sharp    Pain Type  Chronic pain    Pain Onset  More than a month ago    Pain Frequency  Constant    Aggravating Factors   stand, walk x 5'    Pain Relieving Factors  rubbing knees, heat         [facility] PT Assessment - 03/06/19 0001      Assessment   Medical Diagnosis  athralgia, unspecified joint, chronic pain of Rt knee, trochanteric bursitis Rt     Referring Provider (PT)  Micheline Rough, MD    Onset Date/Surgical Date  11/05/18    Next MD Visit  as needed     Prior Therapy  none                   [facility] Adult PT Treatment/Exercise - 03/06/19 0001      Self-Care   Other Self-Care Comments   review of HEP throughout session      Lumbar Exercises: Stretches   Single Knee to Chest Stretch  2 reps;20 seconds;Left;Right      Lumbar Exercises: Supine   Bridge  5 reps      Knee/Hip Exercises: Standing   Heel Raises  Both;10 reps    Knee Flexion  Strengthening;Both;2 sets;5 sets    Hip Flexion  Stengthening;Both;1 set;10 reps;Knee straight    Hip Flexion Limitations  PT cued smaller range for control    Hip Abduction  Stengthening;Both;2 sets;5 reps;Knee straight    Hip Extension  Stengthening;Both;Knee straight;2 sets;5 reps      Knee/Hip Exercises: Seated   Long Arc Quad  Strengthening;Both;2 sets;5 reps    Clamshell with TheraBand  Green   15     Knee/Hip Exercises: Supine   Heel Slides  AROM;Both;15 reps    Other Supine Knee/Hip Exercises  hip flexor isometric 5x5 Pt providing own resistance               PT Short Term Goals - 02/04/19 1208      PT SHORT TERM GOAL #1   Title  be independent in initial HEP    Status  Achieved      PT SHORT TERM GOAL #2   Title  initiate a walking program and verbalize understanding of how to progress slowly    Baseline  unable to walk for exercise due to pain with walking >5'    Status  Deferred      PT SHORT TERM GOAL #3   Title  report < or = to 6/10 max Rt LE pain with standing and walking to improve tolerance for home and community    Baseline  consistent pain 4/10 with activity modification    Status  Partially Met      PT SHORT TERM GOAL #4   Title  demonstrate  bil knee extension to lacking < or = to 10 degrees to improve gait pattern    Status  Achieved        PT Long Term Goals - 03/06/19 0943      PT LONG TERM GOAL #1   Title  be  independent in advanced HEP    Status  Achieved      PT LONG TERM GOAL #2   Title  reduce FOTO to < or = to 37% limitation    Status  Partially Met      PT LONG TERM GOAL #3   Title  perform regular walking or gym exercise and verbalize understanding of safe progression for strength and endurance    Baseline  COVID limiting gym access, pain limits walking x 5'    Status  Deferred      PT LONG TERM GOAL #4   Title  report a 60% reduction in bil knee and Rt hip pain with standing and walking to improve tolerance    Baseline  Pt reports improving mobility and strength but ongoing pain levels    Status  Not Met      PT LONG TERM GOAL #5   Title  demonstrate 4+/5 bil knee strength to improve endurance and safety on steps    Baseline  ranges from 4-4+/5    Status  Partially Met            Plan - 03/06/19 1021    Clinical Impression Statement  Pt participated in 2 rounds of PT for bil knees.  She continues to have consistent pain and limited mobility which prevents her from standing/walking >5'.  She met or partially met most goals.  She demonstrates improved flexibility and strength from initial visit.  Pain has limited tolerance of dynamic ther ex.  She is able to perform sit to stand with use of UEs with pain in bil knees.  PT reviewed HEP and encouraged Pt to follow up with her MD or return to PT as needed for further work up or return for more PT in the future.  She is considering return to the gym or will seek aquatic PT once covid community spread settles.  D/C to HEP at this time due to plateau in progress.    Comorbidities  osteopenia, HTN, history of breast cancer    Examination-Activity Limitations  Locomotion Level;Stand;Stairs;Squat;Transfers    Rehab Potential  Good    PT Frequency  2x / week    PT Duration  6 weeks    PT Treatment/Interventions  ADLs/Self Care Home Management;Cryotherapy;Ultrasound;Moist Heat;Electrical Stimulation;Gait training;Stair training;Balance  training;Therapeutic exercise;Therapeutic activities;Functional mobility training;Neuromuscular re-education;Patient/family education;Manual techniques;Passive range of motion;Dry needling;Taping;Spinal Manipulations    PT Next Visit Plan  d/c to HEP    PT Home Exercise Plan  Access Code: [email or web address]    Recommended Other Services  aquatic PT or gym transition when COVID spread reduces    Consulted and Agree with Plan of Care  Patient       Patient will benefit from skilled therapeutic intervention in order to improve the following deficits and impairments:  Abnormal gait, Decreased activity tolerance, Decreased endurance, Decreased range of motion, Decreased mobility, Decreased strength, Difficulty walking, Impaired flexibility, Postural dysfunction, Improper body mechanics  Visit Diagnosis: Abnormal posture  Chronic pain of right knee  Muscle weakness (generalized)  Chronic pain of left knee  Pain in left hip  Pain in right hip     Problem List Patient Active Problem List  Diagnosis Date Noted  . Superficial basal cell carcinoma (BCC) 02/10/2019  . Genetic testing 12/14/2017  . Family history of uterine cancer   . Family history of prostate cancer   . Melanoma in situ of right upper extremity ([place]) 07/18/2017  . Port-A-Cath in place 04/18/2017  . Drug-induced neutropenia ([place]) 03/09/2017  . Family history of breast cancer 02/02/2017  . Family history of colon cancer 02/02/2017  . Family history of prostate cancer in father 02/02/2017  . Malignant neoplasm of overlapping sites of left breast in female, estrogen receptor positive ([place]) 01/31/2017  . Hx of hot flashes, menopausal, HRT in the past 12/19/2011  . Osteopenia, stable, DEXA 2013, recs to repeat in 2018 12/19/2011  . Hx of Hyperlipidemia, LDL 144 in 07/2011 12/19/2011  . Vitamin D insufficiency 12/19/2011  . Fibroid     PHYSICAL THERAPY DISCHARGE SUMMARY  Visits from Start of Care: 22  Current functional level  related to goals / functional outcomes: See above   Remaining deficits: See above   Education / Equipment: HEP Plan: Patient agrees to discharge.  Patient goals were partially met. Patient is being discharged due to lack of progress.  ?????          Baruch Merl, PT 03/06/19 10:28 AM   [facility] [address] [place], [place], [address] Phone: [phone]   Fax:  [phone]  Name: Brooke [name] MRN: [id number] Date of Birth: 03-10-1950

## 2019-03-06 NOTE — Telephone Encounter (Signed)
Patient had called back asking since she has no one to stay with her after surgery for 24 hours and has no where in her home for someone to stay would she be able to stay the night at the [facility]?  I emailed Tiffany and she sent the email to a couple of other people. Bev Harrleson Soil scientist at [facility] said patient could stay but no idea how insurance would cover it.   I called patient and per DPR access note on file I left detailed message that she could stay the night at [facility] but would need to talk with her insurance co. About her situation and see how they would cover it because if they did not it would be her financial responsibility.  Asked her to call me if I can be of further assistance.

## 2019-03-11 ENCOUNTER — Ambulatory Visit: Payer: Medicare Other | Admitting: Oncology

## 2019-03-11 ENCOUNTER — Ambulatory Visit: Payer: Medicare Other | Admitting: Adult Health

## 2019-03-11 ENCOUNTER — Other Ambulatory Visit: Payer: Medicare Other

## 2019-03-12 ENCOUNTER — Encounter: Payer: Self-pay | Admitting: Obstetrics & Gynecology

## 2019-03-12 DIAGNOSIS — N6489 Other specified disorders of breast: Secondary | ICD-10-CM | POA: Diagnosis not present

## 2019-03-12 DIAGNOSIS — R928 Other abnormal and inconclusive findings on diagnostic imaging of breast: Secondary | ICD-10-CM | POA: Diagnosis not present

## 2019-03-12 NOTE — Progress Notes (Signed)
[place]  [phone] [phone]    ID: Brooke [name] DOB: August 02, 1950  MR#: [id number]  BVQ#:[id number]  Patient Care Team: [name], MD as PCP - General (Family Medicine) Fontaine, Belinda Block, MD (Obstetrics and Gynecology) Rolm Bookbinder, MD as Attending Physician (Dermatology) Camilo Mander, Virgie Dad, MD as Consulting Physician (Oncology) Kyung Rudd, MD as Consulting Physician (Radiation Oncology) Jovita Kussmaul, MD as Consulting Physician (General Surgery) Irene Limbo, MD as Consulting Physician (Plastic Surgery) Delice Bison, Charlestine Massed, NP as Nurse Practitioner (Hematology and Oncology) OTHER MD:   CHIEF COMPLAINT: Estrogen receptor positive breast cancer (s/p left mastectomy)  CURRENT TREATMENT: Tamoxifen   INTERVAL HISTORY: Brooke [name] returns today for follow-up and treatment of her estrogen receptor positive breast cancer.  She continues on tamoxifen with fair tolerance.  However she had an episode of spotting which led to further evaluation by gynecology.  Since her last visit, she underwent routine pap smear on 01/11/2019, which showed minimal atypia and was HPV negative.  She presented back to Dr. Phineas Real on 02/08/2019 with vaginal spotting. She underwent transvaginal ultrasound on 02/27/2019, which showed thickened endometrial echo with cystic changes and feeder vessel suggesting endometrial polyp. Hysteroscopy D&C was recommended.  She is scheduled to meet with Dr. Dellis Filbert, as Dr. Phineas Real is retiring, on 03/22/2019 prior to continuing with recommended hysteroscopy.  Her most recent mammogram was on 03/11/2018 at [facility] and showed no evidence of disease per her report  REVIEW OF SYSTEMS: Brooke [name] has been reviewing all her prior records and she had many questions today.  She is very constipated.  She tells me this started when she began losartan.  She is taking 1 stool softener a day and that is not helping.  She is interested  in taking some herbal products and vitamin E and she brought me those for review.  She is being appropriately careful regarding the pandemic and did not do anything in particular over the holidays other than call folks.  She is having some dry skin and some fatigue that she attributes to the tamoxifen.  She exercises by walking.  This is not very regular.  Aside from that a detailed review of systems today was stable    HISTORY OF CURRENT ILLNESS: From the original intake note:  Brooke [name] herself palpated a change in her left breast and brought this to medical attention.  On 01/18/2017 she underwent bilateral diagnostic mammography with tomography and bilateral breast ultrasonography at [facility].  The breast density was category B.  At the most recent exam was from December 2016.  In the right breast upper outer quadrant there was a 1 cm irregular mass associated with punctate calcification.  By ultrasound this was not well-defined, and it was biopsied on 01/26/2017 with tomographic guidance this showed a complex sclerosing lesion with the usual ductal hyperplasia.  The right axilla was sonographically  On the left mammography showed a 4 cm dense mass with indistinct margins in the upper inner quadrant.  Ultrasound defined a 3 cm irregular mass in the upper inner quadrant of the left breast which was palpable.  There was an abnormal lymph node in the left axilla.  Biopsy of the left mass and abnormal lymph node ([id number], on 01/26/2017) showed both to be involved by invasive ductal carcinoma, with some lobular features but E-cadherin positive, estrogen and progesterone receptors both 100% positive, with strong staining intensity, with MIB-1 between 15-20%.  There was no HER-2 amplification, the signals ratio being 1.31/1.58 and  the number per cell 2.30/2.05  The patient's subsequent history is as detailed below.   PAST MEDICAL HISTORY: Past Medical History:  Diagnosis Date  . Anemia   . Anxiety   .  Arthritis   . ASCUS of cervix with negative high risk HPV 12/2018  . Bilateral dry eyes   . Breast cancer, left breast ([place])   . Family history of breast cancer 02/02/2017  . Family history of breast cancer   . Family history of colon cancer 02/02/2017  . Family history of colon cancer   . Family history of prostate cancer   . Family history of prostate cancer in father 02/02/2017  . Family history of uterine cancer   . Fibroid   . History of kidney stones   . Hyperlipidemia   . Hypertension   . Kidney stones   . Melanoma of upper arm ([place]) 2009   RIGHT ARM   . Obesity (BMI 30.0-34.9)   . Osteopenia 09/2017   T score -1.9 max 8% / 0.8% statistically significant decline at right and left hip stable at the spine  . PONV (postoperative nausea and vomiting)   . Squamous carcinoma     of the skin  . UTI (urinary tract infection)   . Varicose veins of both lower extremities   . Vitamin D deficiency 07/2009   LOW VITAMIN D 29  . Vitamin deficiency 07/2008   VITAMIN D LOW 23    PAST SURGICAL HISTORY: Past Surgical History:  Procedure Laterality Date  . ABDOMINAL SURGERY  1975   ovairan cyst   . APPENDECTOMY    . BREAST LUMPECTOMY WITH RADIOACTIVE SEED LOCALIZATION Right 09/25/2017   Procedure: RIGHT BREAST LUMPECTOMY WITH RADIOACTIVE SEED LOCALIZATION;  Surgeon: Jovita Kussmaul, MD;  Location: [place];  Service: General;  Laterality: Right;  . BREAST SURGERY     bio  . CHOLECYSTECTOMY    . DERMOID CYST  EXCISION    . DILATION AND CURETTAGE OF UTERUS  1998  . GALLBLADDER SURGERY    . HYSTEROSCOPY    . insertion  port a cath  03/02/2016  . MASTECTOMY MODIFIED RADICAL Left 09/25/2017   Procedure: LEFT MODIFIED RADICAL MASTECTOMY;  Surgeon: Jovita Kussmaul, MD;  Location: [place];  Service: General;  Laterality: Left;  . PORTACATH PLACEMENT N/A 03/02/2017   Procedure: INSERTION PORT-A-CATH;  Surgeon: Jovita Kussmaul, MD;  Location: [place];  Service: General;  Laterality: N/A;  . SKIN BIOPSY      . SKIN CANCER EXCISION  2010,2011   2010-RIGHT ARM, 2011-LEFT LOWER LEG.-DR Rolm Bookbinder DERM. DR. Sarajane Jews IS HER DERM SURGEON.    FAMILY HISTORY Family History  Problem Relation Age of Onset  . Diabetes Father   . Hypertension Father   . Heart disease Father   . Prostate cancer Father 24  . Arthritis Father   . Breast cancer Sister 34       again at age 11, GT results unk  . Skin cancer Sister   . Colon cancer Paternal Grandfather 40  . Hypertension Mother   . Heart failure Mother   . Uterine cancer Sister 66       stage IV  . Breast cancer Cousin        mat first cousin dx under 28   The patient's father was diagnosed with prostate cancer in his 107s and died at age 59 from complications of that disease.  The patient's mother died at age 66 with congestive heart  failure.  The patient had no brothers, 3 sisters.  One sister had breast cancer at age 55 and again at age 69.  She has been genetically tested and was found to be BRCA not mutated in addition a paternal grandfather had colon cancer in his 2s.  There is no history of ovarian cancer in the family to the patient's knowledge.   GYNECOLOGIC HISTORY:  Patient's last menstrual period was 03/18/1997. Menarche age 52. The patient is GX P0.  She stopped having periods in 1999 and took hormone replacement approximately 5 years.   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  She is now retired.  She is single and lives alone with no pets.    ADVANCED DIRECTIVES: Not in place   HEALTH MAINTENANCE: Social History   Tobacco Use  . Smoking status: Never Smoker  . Smokeless tobacco: Never Used  Substance Use Topics  . Alcohol use: No  . Drug use: No    Colonoscopy: Never  PAP: 12/2018, minimal atypia  Bone density: 2017/osteopenia   Allergies  Allergen Reactions  . Neosporin [Neomycin-Bacitracin Zn-Polymyx]  Swelling, Rash and Other (See Comments)    Blisters  . Benzalkonium Chloride Other (See Comments)    Redness (Pt is unaware)  It works by killing microorganisms and inhibiting their future growth, and for this reason frequently appears as an ingredient in antibacterial hand wipes, antiseptic creams and anti-itch ointments.  . Latex Rash    Current Outpatient Medications  Medication Sig Dispense Refill  . losartan (COZAAR) 25 MG tablet Take 1 tablet (25 mg total) by mouth daily. (Patient taking differently: Take 12.5 mg by mouth daily. ) 90 tablet 1  . Propylene Glycol (SYSTANE COMPLETE) 0.6 % SOLN Place 1 drop into both eyes daily.    . tamoxifen (NOLVADEX) 20 MG tablet Take 20 mg by mouth daily.    Marland Kitchen triamcinolone cream (KENALOG) 0.1 % Apply 1 application topically 2 (two) times daily. 30 g 2  . vitamin C (ASCORBIC ACID) 500 MG tablet Take 1,000 mg by mouth daily.     Marland Kitchen VITAMIN D, CHOLECALCIFEROL, PO Take 6,000 Units by mouth daily.     Marland Kitchen VITAMIN E PO Take by mouth.     No current facility-administered medications for this visit.    OBJECTIVE: Middle-aged white woman who appears stated age  9:   03/13/19 1437  BP: (!) 151/70  Pulse: (!) 103  Resp: 18  Temp: 97.8 F (36.6 C)  SpO2: 98%     Body mass index is 34.88 kg/m.   Wt Readings from Last 3 Encounters:  03/13/19 231 lb 1.6 oz (104.8 kg)  02/27/19 230 lb (104.3 kg)  01/11/19 230 lb (104.3 kg)  ECOG FS:1 - Symptomatic but completely ambulatory  Sclerae unicteric, EOMs intact Wearing a mask No cervical or supraclavicular adenopathy Lungs no rales or rhonchi Heart regular rate and rhythm Abd soft, nontender, positive bowel sounds MSK no focal spinal tenderness, no upper extremity lymphedema Neuro: nonfocal, well oriented, appropriate affect Breasts: The right breast is status post lumpectomy.  There is no evidence of local recurrence.  The left breast is status post mastectomy and radiation.  There is no evidence  of chest wall recurrence.  Both axillae are benign.   LAB RESULTS:  CMP     Component Value Date/Time   NA 138 03/13/2019 1402   NA 141 03/03/2017 1358  K 3.8 03/13/2019 1402   K 3.3 (L) 03/03/2017 1358   CL 105 03/13/2019 1402   CO2 23 03/13/2019 1402   CO2 28 03/03/2017 1358   GLUCOSE 117 (H) 03/13/2019 1402   GLUCOSE 107 03/03/2017 1358   BUN 15 03/13/2019 1402   BUN 12.7 03/03/2017 1358   CREATININE 0.81 03/13/2019 1402   CREATININE 0.94 09/12/2018 1354   CREATININE 0.9 03/03/2017 1358   CALCIUM 8.8 (L) 03/13/2019 1402   CALCIUM 9.4 03/03/2017 1358   PROT 6.8 03/13/2019 1402   PROT 6.5 03/03/2017 1358   ALBUMIN 4.1 03/13/2019 1402   ALBUMIN 3.7 03/03/2017 1358   AST 13 (L) 03/13/2019 1402   AST 43 (H) 09/12/2018 1354   AST 23 03/03/2017 1358   ALT 19 03/13/2019 1402   ALT 84 (H) 09/12/2018 1354   ALT 25 03/03/2017 1358   ALKPHOS 61 03/13/2019 1402   ALKPHOS 71 03/03/2017 1358   BILITOT 0.4 03/13/2019 1402   BILITOT 0.6 09/12/2018 1354   BILITOT 0.52 03/03/2017 1358   GFRNONAA >60 03/13/2019 1402   GFRNONAA >60 09/12/2018 1354   GFRAA >60 03/13/2019 1402   GFRAA >60 09/12/2018 1354    No results found for: TOTALPROTELP, ALBUMINELP, A1GS, A2GS, BETS, BETA2SER, GAMS, MSPIKE, SPEI  No results found for: Nils Pyle, [facility]  Lab Results  Component Value Date   WBC 5.4 03/13/2019   NEUTROABS 4.0 03/13/2019   HGB 12.6 03/13/2019   HCT 37.7 03/13/2019   MCV 89.8 03/13/2019   PLT 244 03/13/2019      Chemistry      Component Value Date/Time   NA 138 03/13/2019 1402   NA 141 03/03/2017 1358   K 3.8 03/13/2019 1402   K 3.3 (L) 03/03/2017 1358   CL 105 03/13/2019 1402   CO2 23 03/13/2019 1402   CO2 28 03/03/2017 1358   BUN 15 03/13/2019 1402   BUN 12.7 03/03/2017 1358   CREATININE 0.81 03/13/2019 1402   CREATININE 0.94 09/12/2018 1354   CREATININE 0.9 03/03/2017 1358      Component Value Date/Time   CALCIUM 8.8 (L) 03/13/2019 1402     CALCIUM 9.4 03/03/2017 1358   ALKPHOS 61 03/13/2019 1402   ALKPHOS 71 03/03/2017 1358   AST 13 (L) 03/13/2019 1402   AST 43 (H) 09/12/2018 1354   AST 23 03/03/2017 1358   ALT 19 03/13/2019 1402   ALT 84 (H) 09/12/2018 1354   ALT 25 03/03/2017 1358   BILITOT 0.4 03/13/2019 1402   BILITOT 0.6 09/12/2018 1354   BILITOT 0.52 03/03/2017 1358       No results found for: LABCA2  No components found for: MOQHUT654  No results for input(s): INR in the last 168 hours.  No results found for: LABCA2  No results found for: YTK354  No results found for: SFK812  No results found for: XNT700  No results found for: CA2729  No components found for: HGQUANT  No results found for: CEA1 / No results found for: CEA1   No results found for: AFPTUMOR  No results found for: CHROMOGRNA  No results found for: HGBA, HGBA2QUANT, HGBFQUANT, HGBSQUAN (Hemoglobinopathy evaluation)   No results found for: LDH  No results found for: IRON, TIBC, IRONPCTSAT (Iron and TIBC)  No results found for: FERRITIN  Urinalysis    Component Value Date/Time   COLORURINE YELLOW 05/29/2015 1113   APPEARANCEUR CLEAR 05/29/2015 1113   LABSPEC 1.023 05/29/2015 1113   PHURINE 5.5 05/29/2015 1113  GLUCOSEU NEGATIVE 05/29/2015 1113   GLUCOSEU NEGATIVE 11/05/2012 1559   HGBUR NEGATIVE 05/29/2015 1113   BILIRUBINUR neg 08/10/2018 1529   KETONESUR NEGATIVE 05/29/2015 1113   PROTEINUR Negative 08/10/2018 1529   PROTEINUR NEGATIVE 05/29/2015 1113   UROBILINOGEN 0.2 08/10/2018 1529   UROBILINOGEN 0.2 11/05/2012 1559   NITRITE neg 08/10/2018 1529   NITRITE NEGATIVE 05/29/2015 1113   LEUKOCYTESUR Negative 08/10/2018 1529     STUDIES: US Transvaginal Non-OB  Result Date: 02/27/2019 Ultrasound transvaginal shows uterus retroverted with 3 myomas measuring 48 mm pedunculated at the fundus, 24 mm intramural and 13 mm subserosal.  Endometrial echo 14 mm with multiple cystic changes.  3D ultrasound suggests  32 x 27 mm endometrial mass with feeder vessel identified.  Left ovary atrophic in appearance.  Right ovary with 2 small simple cysts 15 mm and 6 mm.  Questionable hydrosalpinx adjacent to the ovary measuring 10 x 46 mm.  No free fluid.   ELIGIBLE FOR AVAILABLE RESEARCH PROTOCOL: no   ASSESSMENT: 69 y.o.  woman status post bilateral biopsies 01/26/2017, showing  (1) in the right breast, a complex sclerosing lesion, status post lumpectomy 09/25/2017, with no malignancy identified.  (2) in the left breast, a cT2 pN1 invasive ductal carcinoma (with some lobular features but E-cadherin positive), grade 2, estrogen and progesterone receptor positive, HER-2 not amplified, with an MIB-1 of 15-20%  (a) breast MRI 02/04/2017 suggests a T3 N1 tumor  (3) started neoadjuvant cyclophosphamide/docetaxel 03/03/2017, discontinued after 1 cycle with very poor tolerance  (a) started cyclophosphamide/methotrexate/fluorouracil (CMF) 03/28/2017, repeated x7 cycles, last dose 08/01/2017  (4) status post left lumpectomy and axillary lymph node dissection 09/25/17 for a ypT5 ypN2, stage IIIA invasive ductal carcinoma, grade 2, again estrogen and progesterone receptor positive, HER-2 not amplified  (5) adjuvant radiation completed 12/21/2017  (a) capecitabine sensitization tolerated only the initial week of radiation (b) Site/dose: The patient initially received a dose of 50.4 Gy in 28 fractions to the left chest wall and left supraclavicular region. This was delivered using a 3-D conformal, 4 field technique. The patient then received a boost to the mastectomy scar. This delivered an additional 10 Gy in 5 fractions using an en face electron field. The total dose was 60.4 Gy.  ( 6) tamoxifen started 03/09/2018  (7) genetics testing 12/12/2017 through the Multi-Gene Panel offered by [facility] found no deleterious mutations in AIP, ALK, APC, ATM, AXIN2,BAP1,  BARD1, BLM, BMPR1A, BRCA1, BRCA2, BRIP1, CASR, CDC73,  CDH1, CDK4, CDKN1B, CDKN1C, CDKN2A (p14ARF), CDKN2A (p16INK4a), CEBPA, CHEK2, CTNNA1, DICER1, DIS3L2, EGFR (c.2369C>T, p.Thr790Met variant only), EPCAM (Deletion/duplication testing only), FH, FLCN, GATA2, GPC3, GREM1 (Promoter region deletion/duplication testing only), HOXB13 (c.251G>A, p.Gly84Glu), HRAS, KIT, MAX, MEN1, MET, MITF (c.952G>A, p.Glu318Lys variant only), MLH1, MSH2, MSH3, MSH6, MUTYH, NBN, NF1, NF2, NTHL1, PALB2, PDGFRA, PHOX2B, PMS2, POLD1, POLE, POT1, PRKAR1A, PTCH1, PTEN, RAD50, RAD51C, RAD51D, RB1, RECQL4, RET, RUNX1, SDHAF2, SDHA (sequence changes only), SDHB, SDHC, SDHD, SMAD4, SMARCA4, SMARCB1, SMARCE1, STK11, SUFU, TERC, TERT, TMEM127, TP53, TSC1, TSC2, VHL, WRN and WT1.   (8) Foundation 1 testing shows stable microsatellite status and 1 mutation/ Mb, TP53 mutations x2  (a) PD-L1 dated July 2019 negative   PLAN: Brooke [name] is now a year and a half out from definitive surgery for breast cancer with no evidence of recurrence.  This is favorable.  She is tolerating tamoxifen moderately well.  I am concerned about the spotting and about the endometrial changes noted on screening.  She does have a hysteroscopy coming up and  I would like to wait until we get those results before making any definitive decisions.  Obviously if they do find endometrial cancer she will need a hysterectomy and in that case she could continue tamoxifen.  Otherwise we will have to weigh the discomfort of further screening procedures versus the alternatives of tamoxifen.  She has done some reading on aromatase inhibitors and just does not think she would want to go that way.  As a last resort we could consider fulvestrant however it may be difficult to obtain that in situation like hers.  We discussed her constipation and she is going to increase the stool softeners to 2 tablets twice daily.  She will also discuss losartan with her primary care physician.  She had many questions going back all the way to her original  pathology and then some notes that I wrote for her last year and we clarified dose.  She brought me some herbal products and others and so long as the high doses of vitamins that she is planning to take are water-soluble I do not worry so much but vitamin E in particular is fat-soluble and I discouraged her from taking that particular formulation which is almost 2000% the daily recommended amount.  She could take it once or at most twice a week  Tentatively I have made a return appointment here in 3 months.  At that point we should be able to make a definitive decision regarding continuing tamoxifen, reconsidering aromatase inhibitors, or possibly considering fulvestrant.  Total encounter time 35 minutes.*  Brooke [name], Virgie Dad, MD  03/13/19 4:39 PM Medical Oncology and Hematology [facility] [place], [place] [address] Tel. [phone]    Fax. [phone]   I, Wilburn Mylar, am acting as scribe for Dr. Virgie Dad. Singleton Hickox.  I, Lurline Del MD, have reviewed the above documentation for accuracy and completeness, and I agree with the above.   *Total Encounter Time as defined by the Centers for Medicare and Medicaid Services includes, in addition to the face-to-face time of a patient visit (documented in the note above) non-face-to-face time: obtaining and reviewing outside history, ordering and reviewing medications, tests or procedures, care coordination (communications with other health care professionals or caregivers) and documentation in the medical record.

## 2019-03-13 ENCOUNTER — Inpatient Hospital Stay: Payer: Medicare PPO | Admitting: Oncology

## 2019-03-13 ENCOUNTER — Inpatient Hospital Stay: Payer: Medicare PPO | Attending: Oncology

## 2019-03-13 ENCOUNTER — Other Ambulatory Visit: Payer: Self-pay

## 2019-03-13 VITALS — BP 151/70 | HR 103 | Temp 97.8°F | Resp 18 | Ht 68.25 in | Wt 231.1 lb

## 2019-03-13 DIAGNOSIS — Z833 Family history of diabetes mellitus: Secondary | ICD-10-CM | POA: Insufficient documentation

## 2019-03-13 DIAGNOSIS — Z9012 Acquired absence of left breast and nipple: Secondary | ICD-10-CM | POA: Insufficient documentation

## 2019-03-13 DIAGNOSIS — Z8349 Family history of other endocrine, nutritional and metabolic diseases: Secondary | ICD-10-CM | POA: Diagnosis not present

## 2019-03-13 DIAGNOSIS — Z800 Family history of malignant neoplasm of digestive organs: Secondary | ICD-10-CM | POA: Diagnosis not present

## 2019-03-13 DIAGNOSIS — C50812 Malignant neoplasm of overlapping sites of left female breast: Secondary | ICD-10-CM | POA: Diagnosis not present

## 2019-03-13 DIAGNOSIS — Z8042 Family history of malignant neoplasm of prostate: Secondary | ICD-10-CM | POA: Insufficient documentation

## 2019-03-13 DIAGNOSIS — Z170 Estrogen receptor positive status [ER+]: Secondary | ICD-10-CM | POA: Insufficient documentation

## 2019-03-13 DIAGNOSIS — Z8261 Family history of arthritis: Secondary | ICD-10-CM | POA: Diagnosis not present

## 2019-03-13 DIAGNOSIS — C50212 Malignant neoplasm of upper-inner quadrant of left female breast: Secondary | ICD-10-CM | POA: Diagnosis not present

## 2019-03-13 DIAGNOSIS — D0361 Melanoma in situ of right upper limb, including shoulder: Secondary | ICD-10-CM

## 2019-03-13 DIAGNOSIS — Z79810 Long term (current) use of selective estrogen receptor modulators (SERMs): Secondary | ICD-10-CM | POA: Diagnosis not present

## 2019-03-13 DIAGNOSIS — Z803 Family history of malignant neoplasm of breast: Secondary | ICD-10-CM | POA: Diagnosis not present

## 2019-03-13 DIAGNOSIS — Z79899 Other long term (current) drug therapy: Secondary | ICD-10-CM | POA: Insufficient documentation

## 2019-03-13 DIAGNOSIS — Z85828 Personal history of other malignant neoplasm of skin: Secondary | ICD-10-CM | POA: Insufficient documentation

## 2019-03-13 DIAGNOSIS — Z8249 Family history of ischemic heart disease and other diseases of the circulatory system: Secondary | ICD-10-CM | POA: Insufficient documentation

## 2019-03-13 DIAGNOSIS — Z923 Personal history of irradiation: Secondary | ICD-10-CM | POA: Insufficient documentation

## 2019-03-13 DIAGNOSIS — Z8049 Family history of malignant neoplasm of other genital organs: Secondary | ICD-10-CM | POA: Diagnosis not present

## 2019-03-13 LAB — CBC WITH DIFFERENTIAL/PLATELET
Abs Immature Granulocytes: 0.01 10*3/uL (ref 0.00–0.07)
Basophils Absolute: 0 10*3/uL (ref 0.0–0.1)
Basophils Relative: 1 %
Eosinophils Absolute: 0.1 10*3/uL (ref 0.0–0.5)
Eosinophils Relative: 2 %
HCT: 37.7 % (ref 36.0–46.0)
Hemoglobin: 12.6 g/dL (ref 12.0–15.0)
Immature Granulocytes: 0 %
Lymphocytes Relative: 15 %
Lymphs Abs: 0.8 10*3/uL (ref 0.7–4.0)
MCH: 30 pg (ref 26.0–34.0)
MCHC: 33.4 g/dL (ref 30.0–36.0)
MCV: 89.8 fL (ref 80.0–100.0)
Monocytes Absolute: 0.5 10*3/uL (ref 0.1–1.0)
Monocytes Relative: 8 %
Neutro Abs: 4 10*3/uL (ref 1.7–7.7)
Neutrophils Relative %: 74 %
Platelets: 244 10*3/uL (ref 150–400)
RBC: 4.2 MIL/uL (ref 3.87–5.11)
RDW: 12.8 % (ref 11.5–15.5)
WBC: 5.4 10*3/uL (ref 4.0–10.5)
nRBC: 0 % (ref 0.0–0.2)

## 2019-03-13 LAB — COMPREHENSIVE METABOLIC PANEL
ALT: 19 U/L (ref 0–44)
AST: 13 U/L — ABNORMAL LOW (ref 15–41)
Albumin: 4.1 g/dL (ref 3.5–5.0)
Alkaline Phosphatase: 61 U/L (ref 38–126)
Anion gap: 10 (ref 5–15)
BUN: 15 mg/dL (ref 8–23)
CO2: 23 mmol/L (ref 22–32)
Calcium: 8.8 mg/dL — ABNORMAL LOW (ref 8.9–10.3)
Chloride: 105 mmol/L (ref 98–111)
Creatinine, Ser: 0.81 mg/dL (ref 0.44–1.00)
GFR calc Af Amer: >60 mL/min (ref >60)
GFR calc non Af Amer: >60 mL/min (ref >60)
Glucose, Bld: 117 mg/dL — ABNORMAL HIGH (ref 70–99)
Potassium: 3.8 mmol/L (ref 3.5–5.1)
Sodium: 138 mmol/L (ref 135–145)
Total Bilirubin: 0.4 mg/dL (ref 0.3–1.2)
Total Protein: 6.8 g/dL (ref 6.5–8.1)

## 2019-03-14 ENCOUNTER — Telehealth: Payer: Self-pay | Admitting: Oncology

## 2019-03-14 ENCOUNTER — Telehealth: Payer: Self-pay

## 2019-03-14 NOTE — Telephone Encounter (Signed)
I left a message regarding schedule  

## 2019-03-14 NOTE — Telephone Encounter (Signed)
I had spoken with patient previously and we discussed me holding OR time on 03/26/19 until she could see if she could work out details of surgery.  She was to let me know.  I realized today that my block time was release as is protocol now.  I found a piece of time at 7:30am on that same day and held it through [place]. I called her and per DPR left a detailed message and asked her to let me know and told her that block had been release before I heard from her and to let me know if she would like the 7:30am slot asap so if not I can release it. I did let her know I was off tomorrow.

## 2019-03-15 ENCOUNTER — Telehealth: Payer: Self-pay | Admitting: *Deleted

## 2019-03-15 NOTE — Telephone Encounter (Signed)
Brooke [name] anne:Please check in with patient on Wednesday, January 20.  Please see if she was able to have softer bowel movements and how belly is feeling?    Below is providers phone note from discussion on Friday Jan 15th   She has been having issues with more constipated stools for multiple months.  She had a follow-up visit with Dr. [name] this week and discussed constipation issues with him.  She was worried that it is coming from the losartan.  We had a long discussion about medication side effects.  Everybody is different in terms of what side effects to get from medications, but she feels that diet has not changed in fluid intake is at least a gallon of water a day.  She tried MiraLAX one time without success.  She has intermittently taken Colace.  Bowel movements are like concrete and she has had to manually disimpact at times due to hard stools.  I agreed with Dr. Bjorn Loser not to increase the Colace I encouraged her to also add MiraLAX through the weekend and keep up with her fluid intake.  I would like to make sure she is getting some progress by Wednesday.  She does feel some fullness in the belly secondary to the stools and has had some blood with wiping.  She has been unable to complete the Cologuard due to the difficulty with having a bowel movement.  I encouraged her to call if she has any worsening abdominal discomfort or she is not getting any success with above recommendations.

## 2019-03-15 NOTE — Telephone Encounter (Signed)
Copied from [place] [phone]. Topic: General - Other >> Mar 15, 2019  8:48 AM Leward Quan A wrote: Reason for CRM: Patient called to inform Dr Ethlyn Gallery that she have stopped taking the losartan (COZAAR) 25 MG tablet due to lower GI issues. States that she think it is the losartan (COZAAR) 25 MG tablet that is causing these issues and until they are resolved she will not take that particular medicine again. She states that if Dr Ethlyn Gallery want to change that medication please call her at  Ph# [phone]

## 2019-03-18 NOTE — Telephone Encounter (Signed)
Patient called in voice mail to let me know she did not want this date for surgery.

## 2019-03-20 NOTE — Telephone Encounter (Signed)
Pt. ret'd call.  Advised of Dr. Berenice Bouton recommendation to take 2 doses of Miralax today and 2 doses tomorrow, in addition to taking the increased dose of Colace (as prescribed by Dr. [name]).  Advised pt. To call with an update on Friday.  Also advised to call sooner, if she needs to.  Pt. Verb. Understanding and agreed with plan.

## 2019-03-20 NOTE — Telephone Encounter (Signed)
Left a message for the pt to return a call to the office.  CRM also created. 

## 2019-03-20 NOTE — Telephone Encounter (Signed)
I really want her to try the miralax along with colace. Other things we use are going to be harsher on the bowel. She can take 2 doses of the miralax today (in addition to the doubling of colace still; please note that I was using dictation and I meant to say in previous note that I agreed with Dr. [name] about increasing this, which I did relay to patient previously) and again tomorrow along with the increased colace and let's see how she is on Friday (please check back in). I can send in a bowel prep that she could do if we don't get results with above. If she feels she needs more sooner let me know.

## 2019-03-20 NOTE — Telephone Encounter (Signed)
Spoke with the pt and she stated she used Colace, no Miralax.  Stated bowel movements "have turned to concrete" and belly is still sore.  BP 139/79 this morning.  Message sent to PCP.

## 2019-03-21 ENCOUNTER — Other Ambulatory Visit: Payer: Self-pay

## 2019-03-22 ENCOUNTER — Encounter: Payer: Self-pay | Admitting: Obstetrics & Gynecology

## 2019-03-22 ENCOUNTER — Ambulatory Visit: Payer: Medicare PPO | Admitting: Obstetrics & Gynecology

## 2019-03-22 VITALS — BP 130/80

## 2019-03-22 DIAGNOSIS — N840 Polyp of corpus uteri: Secondary | ICD-10-CM

## 2019-03-22 DIAGNOSIS — Z853 Personal history of malignant neoplasm of breast: Secondary | ICD-10-CM | POA: Diagnosis not present

## 2019-03-22 DIAGNOSIS — N950 Postmenopausal bleeding: Secondary | ICD-10-CM | POA: Diagnosis not present

## 2019-03-22 DIAGNOSIS — R9389 Abnormal findings on diagnostic imaging of other specified body structures: Secondary | ICD-10-CM

## 2019-03-22 NOTE — Telephone Encounter (Signed)
Left a message for the pt to return a call to the office.

## 2019-03-22 NOTE — Progress Notes (Signed)
    Brooke [name] 18-Nov-1950 [id number]        69 y.o.  G0P0000   RP: PMB/Endometrial Polyp on Tamoxifen for Preop visit  HPI: Started having PMB on 02/08/2019, seen by Dr Phineas Real that same day.  Started on Tamoxifen 02/2018 for Breast Cancer.   OB History  Gravida Para Term Preterm AB Living  0 0 0 0 0 0  SAB TAB Ectopic Multiple Live Births  0 0 0 0      Past medical history,surgical history, problem list, medications, allergies, family history and social history were all reviewed and documented in the EPIC chart.   Directed ROS with pertinent positives and negatives documented in the history of present illness/assessment and plan.  Exam:  Vitals:   03/22/19 1518  BP: 130/80   General appearance:  Normal  Pelvic US 02/27/2019: Ultrasound transvaginal shows uterus retroverted with 3 myomas measuring 48 mm pedunculated at the fundus, 24 mm intramural and 13 mm subserosal. Endometrial echo 14 mm with multiple cystic changes. 3D ultrasound suggests 32 x 27 mm endometrial mass with feeder vessel identified. Left ovary atrophic in appearance. Right ovary with 2 small simple cysts 15 mm and 6 mm. Questionable hydrosalpinx adjacent to the ovary measuring 10 x 46 mm. No free fluid.   Assessment/Plan:  69 y.o. G0P0000   1. PMB (postmenopausal bleeding) Postmenopausal bleeding after being on tamoxifen for about a year.  Pelvic ultrasound showing a thickened endometrial lining with a probable endometrial polyp measured at 3.2 x 2.7 cm.  Decision to proceed with hysteroscopy, MyoSure excision and D&C.  Patient understand what the procedure consists of and voiced agreement on the plan.  Discussion with patient on how to organize the surgery given that she does not have anyone who could supervise her on the first postop day and overnight.  We will see if we can keep the patient under observation overnight and then she would find a ride home the next day.  2. Increased endometrial  stripe thickness Endometrial lining thickened at 14 mm with multiple cystic changes.  On 3D ultrasound a probable endometrial polyp is present measuring 3.2 x 2.7 cm.  3. Endometrial polyp Endometrial lesion on 3D ultrasound measuring 3.2 x 2.7 cm compatible with an endometrial polyp.  4. History of breast cancer On tamoxifen for 1 year.                        Patient was counseled as to the risk of surgery to include the following:  1. Infection (prohylactic antibiotics will be administered)  2. DVT/Pulmonary Embolism (prophylactic pneumo compression stockings will be used)  3.Trauma to internal organs requiring additional surgical procedure to repair any injury to internal organs requiring perhaps additional hospitalization days.  4.Hemmorhage requiring transfusion and blood products which carry risks such as     anaphylactic reaction, hepatitis and AIDS  Patient had received literature information on the procedure scheduled and all her questions were answered and fully accepts all risk.   [name] MD, 4:06 PM 03/22/2019

## 2019-03-22 NOTE — Patient Instructions (Signed)
1. PMB (postmenopausal bleeding) Postmenopausal bleeding after being on tamoxifen for about a year.  Pelvic ultrasound showing a thickened endometrial lining with a probable endometrial polyp measured at 3.2 x 2.7 cm.  Decision to proceed with hysteroscopy, MyoSure excision and D&C.  Patient understand what the procedure consists of and voiced agreement on the plan.  Discussion with patient on how to organize the surgery given that she does not have anyone who could supervise her on the first postop day and overnight.  We will see if we can keep the patient under observation overnight and then she would find a ride home the next day.  2. Increased endometrial stripe thickness Endometrial lining thickened at 14 mm with multiple cystic changes.  On 3D ultrasound a probable endometrial polyp is present measuring 3.2 x 2.7 cm.  3. Endometrial polyp Endometrial lesion on 3D ultrasound measuring 3.2 x 2.7 cm compatible with an endometrial polyp.  4. History of breast cancer On tamoxifen for 1 year.                        Patient was counseled as to the risk of surgery to include the following:  1. Infection (prohylactic antibiotics will be administered)  2. DVT/Pulmonary Embolism (prophylactic pneumo compression stockings will be used)  3.Trauma to internal organs requiring additional surgical procedure to repair any injury to internal organs requiring perhaps additional hospitalization days.  4.Hemmorhage requiring transfusion and blood products which carry risks such as     anaphylactic reaction, hepatitis and AIDS  Patient had received literature information on the procedure scheduled and all her questions were answered and fully accepts all risk.  Brooke [name], it was a pleasure meeting you today!

## 2019-03-22 NOTE — Telephone Encounter (Signed)
The patient called to let Brooke [name] know that she had a BM today.

## 2019-03-22 NOTE — Telephone Encounter (Signed)
Good! Keep doing the miralax daily as well as the colace twice daily. I would like for her to keep having regular bowel movements. It will take awhile to get her "emptied out". If it slows down let me know and we can do something stronger.

## 2019-03-25 NOTE — Telephone Encounter (Signed)
Patient stated she would prefer to have a visit to discuss taking these medications or to see if there is something else she can do.  Appt scheduled on 2/10 for a virtual visit.

## 2019-03-25 NOTE — Telephone Encounter (Signed)
Spoke with the pt and informed her of the message below.   

## 2019-03-27 ENCOUNTER — Telehealth: Payer: Self-pay

## 2019-03-27 NOTE — Telephone Encounter (Signed)
I spoke with Santiago Glad at [facility] regarding insurance coverage for patient to stay 23 hours observation as she has no one to stay with her after surgery or pick her up.  Rep said [facility] for 23 hour obs. Is a covered service with no copayment.  Call ref# [id number]

## 2019-03-27 NOTE — Telephone Encounter (Signed)
Patient came to see Dr. Dellis Filbert and would like to schedule surgery end of Feb.  I called her ins co. And is okay for 23 hours obs since patient has no one to stay with her or pick her up from surgery.  See separate telephone encounter w/[employer] rep.  Patient advised. Scheduled her for 23 hr obs surgery on 04/23/19 at 9:30am at [facility].  Patient advised she has $250.00 copymt to facility but pays 100% of claims so no surgery prepymt due to [facility].    I scheduled her Covid screen and advised her regarding quarantine protocol.  She has already had pre op visit with Dr. Dellis Filbert.  Packet will be mailed.

## 2019-03-28 ENCOUNTER — Other Ambulatory Visit: Payer: Self-pay | Admitting: Oncology

## 2019-03-29 ENCOUNTER — Telehealth: Payer: Self-pay

## 2019-03-29 NOTE — Telephone Encounter (Signed)
Very low risk of needing a Blood transfusion with her surgery.  Not doing Autologous because the risk is too low to warrant that type of undertaking.

## 2019-03-29 NOTE — Telephone Encounter (Signed)
Patient received AVS and noted 4 points about upcoming surgery:  1. Infection (prohylactic antibiotics will be administered)  2. DVT/Pulmonary Embolism (prophylactic pneumo compression stockings will be used)  3.Trauma to internal organs requiring additional surgical procedure to repair any injury to internal organs requiring perhaps additional hospitalization days.  4.Hemmorhage requiring transfusion and blood products which carry risks such as     anaphylactic reaction, hepatitis and AIDS  Patient had received literature information on the procedure scheduled and all her questions were answered and fully accepts all risk.  Patient said she did not realize that blood products were capable of relaying disease and wondered if she should make arrangements to donate her own blood prior to surgery?

## 2019-04-01 NOTE — Telephone Encounter (Signed)
Per DPR access note on file I called patient and left voice mail message with Dr. Marguerita Merles recommendation.  Advised patient that those points are usually discussed in pre op visit and if she wants to discuss the other 4 points as she indicated that we should schedule a pre op televisit for her to talk with Dr. Dellis Filbert and let her review them and answer any questions.  Can call the appointment desk to schedule.

## 2019-04-10 ENCOUNTER — Other Ambulatory Visit: Payer: Self-pay

## 2019-04-10 ENCOUNTER — Telehealth (INDEPENDENT_AMBULATORY_CARE_PROVIDER_SITE_OTHER): Payer: Medicare PPO | Admitting: Family Medicine

## 2019-04-10 DIAGNOSIS — I10 Essential (primary) hypertension: Secondary | ICD-10-CM

## 2019-04-10 DIAGNOSIS — K5900 Constipation, unspecified: Secondary | ICD-10-CM | POA: Diagnosis not present

## 2019-04-10 MED ORDER — HYDROCHLOROTHIAZIDE 12.5 MG PO TABS: 12.5000 mg | ORAL_TABLET | Freq: Every day | ORAL | 1 refills | Status: DC

## 2019-04-10 NOTE — Progress Notes (Signed)
Virtual Visit via Telephone Note  I connected with Brooke [name]  on 04/10/19 at  1:00 PM EST by telephone and verified that I am speaking with the correct person using two identifiers.   I discussed the limitations, risks, security and privacy concerns of performing an evaluation and management service by telephone and the availability of in person appointments. I also discussed with the patient that there may be a patient responsible charge related to this service. The patient expressed understanding and agreed to proceed.  Location patient: home Location provider: work office Participants present for the call: patient, provider Patient did not have a visit in the prior 7 days to address this/these issue(s).   History of Present Illness: If she stays on colace and miralax, then she does ok. On Sunday stopped taking the miralax and without it then she feels that texture of stool is like concrete. Diet has not changed. Wonders if related to medication that she is taking.   Has been unable to do the cologuard due to ongoing issue with BM. Has hemorrhoid near anus and so will get blood and if straining gets urine in collection.   Stools are of normal consistency with the miralax and colace in combination. Sometimes gets combo of "rocks" and more normal BM. Takes 2 colace BID. Back to concrete not having taken miralax since Sunday.   When only on tamoxifen, doesn't remember having issue with BM. Started losartan in September and feels like after this point, then she started to have issues with her bowel movements.    Only discontinued losartan for 2 days a few weeks ago. Has otherwise been taking. Has not been checking blood pressures regularly. Otherwise not having any negative side effects from medication.   In December had some spotting and on Dec 30th had [place] and had some thickening of uterine lining/ polyp.   Observations/Objective: Patient sounds cheerful and well on the phone. I do not  appreciate any SOB. Speech and thought processing are grossly intact. Patient reported vitals: has not yet checked today.  Assessment and Plan: 1. Essential hypertension Encouraged her to check pressures at home regularly. We will switch out the losartan and start hctz. She will update me in 1 months time by phone or sooner if any issues.   2. Constipation, unspecified constipation type Continue with the colace and miralax. I suspect that constipation might be related to tamoxifen as this is a listed side effect, although as we discussed not one that I frequently hear about; but we are going to switch up her blood pressure medication and see if this helps. Continue with high fiber diet, increased fluid intake. Complete cologuard.   Visit time discussing constipation, treatment, new meds/side effects medications.  Follow Up Instructions:  prn   [id number] [id number] [id number] 11-20 [id number] 21-30 I did not refer this patient for an OV in the next 24 hours for this/these issue(s).  I discussed the assessment and treatment plan with the patient. The patient was provided an opportunity to ask questions and all were answered. The patient agreed with the plan and demonstrated an understanding of the instructions.   The patient was advised to call back or seek an in-person evaluation if the symptoms worsen or if the condition fails to improve as anticipated.  I provided 35 minutes of non-face-to-face time during this encounter.   Micheline Rough, MD

## 2019-04-11 ENCOUNTER — Telehealth: Payer: Self-pay | Admitting: Family Medicine

## 2019-04-11 NOTE — Telephone Encounter (Signed)
I left a detailed message at the pts cell number with the information below. 

## 2019-04-11 NOTE — Telephone Encounter (Signed)
*  yes she can wait until after she has surgery *if she is ok with miralax/colace and losartan she can just stay on this combo *hctz would be low dose and I would not say it is hard on kidneys, esp at dose we are starting. We just monitor electrolytes since those can be affected. But if she would like to stay on the losartan that is completely fine.

## 2019-04-11 NOTE — Telephone Encounter (Signed)
Change from Losartan to a Dieretic medication. Varicose veins in lower legs. 04/23/2019 surgical procedure  Can she wait til after she has her surgery.   She would rather stay on a more gentle medication and not something that is going to be hard on her kidneys.  Please advise

## 2019-04-15 ENCOUNTER — Other Ambulatory Visit: Payer: Self-pay

## 2019-04-17 ENCOUNTER — Ambulatory Visit: Payer: Medicare PPO | Admitting: Obstetrics & Gynecology

## 2019-04-17 ENCOUNTER — Encounter: Payer: Self-pay | Admitting: Obstetrics & Gynecology

## 2019-04-17 ENCOUNTER — Other Ambulatory Visit: Payer: Self-pay

## 2019-04-17 VITALS — BP 134/80

## 2019-04-17 DIAGNOSIS — N950 Postmenopausal bleeding: Secondary | ICD-10-CM | POA: Diagnosis not present

## 2019-04-17 DIAGNOSIS — N840 Polyp of corpus uteri: Secondary | ICD-10-CM

## 2019-04-17 NOTE — Progress Notes (Signed)
    Brooke [name] Jan 08, 1951 [id number]        69 y.o.  G0  RP: Preop PMB/Endometrial Polyp on Tamoxifen  HPI: Started having light PMB on 02/08/2019, seen by Dr Phineas Real that same day.  No recent PMB, no pelvic pain. Started on Tamoxifen 02/2018 for Breast Cancer.   OB History  Gravida Para Term Preterm AB Living  0 0 0 0 0 0  SAB TAB Ectopic Multiple Live Births  0 0 0 0      Past medical history,surgical history, problem list, medications, allergies, family history and social history were all reviewed and documented in the EPIC chart.   Directed ROS with pertinent positives and negatives documented in the history of present illness/assessment and plan.  Exam:  Vitals:   69/17/21 1133  BP: 134/80   General appearance:  Normal  Pelvic US 02/27/2019: Ultrasound transvaginal shows uterus retroverted with 3 myomas measuring 48 mm pedunculated at the fundus, 24 mm intramural and 13 mm subserosal. Endometrial echo 14 mm with multiple cystic changes. 3D ultrasound suggests 32 x 27 mm endometrial mass with feeder vessel identified. Left ovary atrophic in appearance. Right ovary with 2 small simple cysts 15 mm and 6 mm. Questionable hydrosalpinx adjacent to the ovary measuring 10 x 46 mm. No free fluid.   Assessment/Plan:  69 y.o. G0P0000   1. PMB (postmenopausal bleeding) Probably due to an Endometrial Polyp.  R/O other Endometrial Pathology.  2. Endometrial polyp Pelvic US showd a 3.2 x 2.7 cm endometrial mass c/w an Endometrial Polyp.  Pelvic US reviewed with patient.  Surgery counseling done including Preop preparation, Surgical procedure with thorough discussion of all risks, but in particular, the risk of uterine perforation, and post op precautions.  Patient voiced understanding and agreement with the plan.  Other orders - losartan (COZAAR) 25 MG tablet; Take 12.5 mg by mouth daily. - vitamin E 600 UNIT capsule; Take 600 Units by mouth 2 (two) times a week.                    Patient was counseled as to the risk of surgery to include the following:  1. Infection (prohylactic antibiotics will be administered)  2. DVT/Pulmonary Embolism (prophylactic pneumo compression stockings will be used)  3.Trauma to internal organs requiring additional surgical procedure to repair any injury to internal organs requiring perhaps additional hospitalization days.  4.Hemmorhage requiring transfusion and blood products which carry risks such as anaphylactic reaction, hepatitis and AIDS  Patient had received literature information on the procedure scheduled and all her questions were answered and fully accepts all risk.   [name] MD, 12:10 PM 04/17/2019

## 2019-04-18 ENCOUNTER — Encounter: Payer: Self-pay | Admitting: Obstetrics & Gynecology

## 2019-04-18 ENCOUNTER — Encounter (HOSPITAL_BASED_OUTPATIENT_CLINIC_OR_DEPARTMENT_OTHER): Payer: Self-pay | Admitting: Obstetrics & Gynecology

## 2019-04-18 ENCOUNTER — Other Ambulatory Visit: Payer: Self-pay

## 2019-04-18 NOTE — Patient Instructions (Signed)
1. PMB (postmenopausal bleeding) Probably due to an Endometrial Polyp.  R/O other Endometrial Pathology.  2. Endometrial polyp Pelvic US showd a 3.2 x 2.7 cm endometrial mass c/w an Endometrial Polyp.  Pelvic US reviewed with patient.  Surgery counseling done including Preop preparation, Surgical procedure with thorough discussion of all risks, but in particular, the risk of uterine perforation, and post op precautions.  Patient voiced understanding and agreement with the plan.  Other orders - losartan (COZAAR) 25 MG tablet; Take 12.5 mg by mouth daily. - vitamin E 600 UNIT capsule; Take 600 Units by mouth 2 (two) times a week.                        Patient was counseled as to the risk of surgery to include the following:  1. Infection (prohylactic antibiotics will be administered)  2. DVT/Pulmonary Embolism (prophylactic pneumo compression stockings will be used)  3.Trauma to internal organs requiring additional surgical procedure to repair any injury to internal organs requiring perhaps additional hospitalization days.  4.Hemmorhage requiring transfusion and blood products which carry risks such as anaphylactic reaction, hepatitis and AIDS  Patient had received literature information on the procedure scheduled and all her questions were answered and fully accepts all risk.  Urenna, it was a pleasure seeing you today!

## 2019-04-18 NOTE — Progress Notes (Addendum)
Spoke w/ via phone for pre-op interview---[facility] Lab needs dos----   Cbc, bmet, ekg           Lab results------ COVID test ------04-19-2019 Arrive at -------715 am 04-23-2019 NPO after ------midnight Medications to take morning of surgery -----patient wishes to take no meds am of surgery Diabetic medication -----n/a Patient Special Instructions -----patient given overnight stay instructions including bring prescription medications in original containers Pre-Op special Istructions ----- Patient verbalized understanding of instructions that were given at this phone interview. Patient denies shortness of breath, chest pain, fever, cough a this phone interview.

## 2019-04-19 ENCOUNTER — Telehealth: Payer: Self-pay

## 2019-04-19 ENCOUNTER — Other Ambulatory Visit (HOSPITAL_COMMUNITY)
Admission: RE | Admit: 2019-04-19 | Discharge: 2019-04-19 | Disposition: A | Discharge status: Home / Self Care | Payer: Medicare PPO | Source: Ambulatory Visit | Attending: Obstetrics & Gynecology | Admitting: Obstetrics & Gynecology

## 2019-04-19 DIAGNOSIS — Z01812 Encounter for preprocedural laboratory examination: Secondary | ICD-10-CM | POA: Diagnosis not present

## 2019-04-19 DIAGNOSIS — Z20822 Contact with and (suspected) exposure to covid-19: Secondary | ICD-10-CM | POA: Diagnosis not present

## 2019-04-19 LAB — SARS CORONAVIRUS 2 (TAT 6-24 HRS): SARS Coronavirus 2: NEGATIVE

## 2019-04-19 NOTE — Telephone Encounter (Addendum)
Patient called stating that the preadmitting nurse for [facility], Ivin Booty, had asked her to call and ask you to place pre op order for "pre-op drink Ensure" and they would like it to be done today if possible.

## 2019-04-23 ENCOUNTER — Encounter (HOSPITAL_BASED_OUTPATIENT_CLINIC_OR_DEPARTMENT_OTHER)
Admission: RE | Disposition: A | Discharge status: Home / Self Care | Payer: Self-pay | Source: Home / Self Care | Attending: Obstetrics & Gynecology

## 2019-04-23 ENCOUNTER — Ambulatory Visit (HOSPITAL_BASED_OUTPATIENT_CLINIC_OR_DEPARTMENT_OTHER)
Admission: RE | Admit: 2019-04-23 | Discharge: 2019-04-24 | Disposition: A | Discharge status: Home / Self Care | Payer: Medicare PPO | Attending: Obstetrics & Gynecology | Admitting: Obstetrics & Gynecology

## 2019-04-23 ENCOUNTER — Ambulatory Visit (HOSPITAL_BASED_OUTPATIENT_CLINIC_OR_DEPARTMENT_OTHER): Payer: Medicare PPO | Admitting: Anesthesiology

## 2019-04-23 ENCOUNTER — Other Ambulatory Visit: Payer: Self-pay

## 2019-04-23 DIAGNOSIS — N840 Polyp of corpus uteri: Secondary | ICD-10-CM | POA: Insufficient documentation

## 2019-04-23 DIAGNOSIS — M1990 Unspecified osteoarthritis, unspecified site: Secondary | ICD-10-CM | POA: Diagnosis not present

## 2019-04-23 DIAGNOSIS — N950 Postmenopausal bleeding: Secondary | ICD-10-CM | POA: Diagnosis not present

## 2019-04-23 DIAGNOSIS — Z9889 Other specified postprocedural states: Secondary | ICD-10-CM

## 2019-04-23 DIAGNOSIS — Z85828 Personal history of other malignant neoplasm of skin: Secondary | ICD-10-CM | POA: Diagnosis not present

## 2019-04-23 DIAGNOSIS — F419 Anxiety disorder, unspecified: Secondary | ICD-10-CM | POA: Insufficient documentation

## 2019-04-23 DIAGNOSIS — Z6834 Body mass index (BMI) 34.0-34.9, adult: Secondary | ICD-10-CM | POA: Diagnosis not present

## 2019-04-23 DIAGNOSIS — E785 Hyperlipidemia, unspecified: Secondary | ICD-10-CM | POA: Diagnosis not present

## 2019-04-23 DIAGNOSIS — Z91040 Latex allergy status: Secondary | ICD-10-CM | POA: Insufficient documentation

## 2019-04-23 DIAGNOSIS — Z8349 Family history of other endocrine, nutritional and metabolic diseases: Secondary | ICD-10-CM | POA: Insufficient documentation

## 2019-04-23 DIAGNOSIS — Z87442 Personal history of urinary calculi: Secondary | ICD-10-CM | POA: Diagnosis not present

## 2019-04-23 DIAGNOSIS — Z8261 Family history of arthritis: Secondary | ICD-10-CM | POA: Diagnosis not present

## 2019-04-23 DIAGNOSIS — Z9049 Acquired absence of other specified parts of digestive tract: Secondary | ICD-10-CM | POA: Diagnosis not present

## 2019-04-23 DIAGNOSIS — Z8042 Family history of malignant neoplasm of prostate: Secondary | ICD-10-CM | POA: Insufficient documentation

## 2019-04-23 DIAGNOSIS — Z8049 Family history of malignant neoplasm of other genital organs: Secondary | ICD-10-CM | POA: Insufficient documentation

## 2019-04-23 DIAGNOSIS — Z79810 Long term (current) use of selective estrogen receptor modulators (SERMs): Secondary | ICD-10-CM | POA: Diagnosis not present

## 2019-04-23 DIAGNOSIS — Z881 Allergy status to other antibiotic agents status: Secondary | ICD-10-CM | POA: Insufficient documentation

## 2019-04-23 DIAGNOSIS — Z800 Family history of malignant neoplasm of digestive organs: Secondary | ICD-10-CM | POA: Diagnosis not present

## 2019-04-23 DIAGNOSIS — R9389 Abnormal findings on diagnostic imaging of other specified body structures: Secondary | ICD-10-CM | POA: Diagnosis not present

## 2019-04-23 DIAGNOSIS — E669 Obesity, unspecified: Secondary | ICD-10-CM | POA: Insufficient documentation

## 2019-04-23 DIAGNOSIS — E559 Vitamin D deficiency, unspecified: Secondary | ICD-10-CM | POA: Insufficient documentation

## 2019-04-23 DIAGNOSIS — Z803 Family history of malignant neoplasm of breast: Secondary | ICD-10-CM | POA: Diagnosis not present

## 2019-04-23 DIAGNOSIS — Z8249 Family history of ischemic heart disease and other diseases of the circulatory system: Secondary | ICD-10-CM | POA: Diagnosis not present

## 2019-04-23 DIAGNOSIS — Z888 Allergy status to other drugs, medicaments and biological substances status: Secondary | ICD-10-CM | POA: Insufficient documentation

## 2019-04-23 DIAGNOSIS — I10 Essential (primary) hypertension: Secondary | ICD-10-CM | POA: Insufficient documentation

## 2019-04-23 DIAGNOSIS — Z833 Family history of diabetes mellitus: Secondary | ICD-10-CM | POA: Insufficient documentation

## 2019-04-23 DIAGNOSIS — M8580 Other specified disorders of bone density and structure, unspecified site: Secondary | ICD-10-CM | POA: Diagnosis not present

## 2019-04-23 DIAGNOSIS — Z853 Personal history of malignant neoplasm of breast: Secondary | ICD-10-CM | POA: Diagnosis not present

## 2019-04-23 DIAGNOSIS — Z79899 Other long term (current) drug therapy: Secondary | ICD-10-CM | POA: Diagnosis not present

## 2019-04-23 HISTORY — PX: DILATATION & CURETTAGE/HYSTEROSCOPY WITH MYOSURE: SHX6511

## 2019-04-23 LAB — BASIC METABOLIC PANEL
Anion gap: 10 (ref 5–15)
BUN: 17 mg/dL (ref 8–23)
CO2: 24 mmol/L (ref 22–32)
Calcium: 9.3 mg/dL (ref 8.9–10.3)
Chloride: 106 mmol/L (ref 98–111)
Creatinine, Ser: 0.8 mg/dL (ref 0.44–1.00)
GFR calc Af Amer: >60 mL/min (ref >60)
GFR calc non Af Amer: >60 mL/min (ref >60)
Glucose, Bld: 137 mg/dL — ABNORMAL HIGH (ref 70–99)
Potassium: 3.5 mmol/L (ref 3.5–5.1)
Sodium: 140 mmol/L (ref 135–145)

## 2019-04-23 LAB — CBC
HCT: 39.7 % (ref 36.0–46.0)
Hemoglobin: 13 g/dL (ref 12.0–15.0)
MCH: 29.7 pg (ref 26.0–34.0)
MCHC: 32.7 g/dL (ref 30.0–36.0)
MCV: 90.6 fL (ref 80.0–100.0)
Platelets: 247 10*3/uL (ref 150–400)
RBC: 4.38 MIL/uL (ref 3.87–5.11)
RDW: 12.5 % (ref 11.5–15.5)
WBC: 5.9 10*3/uL (ref 4.0–10.5)
nRBC: 0 % (ref 0.0–0.2)

## 2019-04-23 SURGERY — DILATATION & CURETTAGE/HYSTEROSCOPY WITH MYOSURE
Anesthesia: General | Site: Vagina

## 2019-04-23 MED ORDER — CEFAZOLIN SODIUM-DEXTROSE 2-4 GM/100ML-% IV SOLN
2.0000 g | INTRAVENOUS | Status: AC
  Administered 2019-04-23: 10:00:00 2 g via INTRAVENOUS
  Filled 2019-04-23: qty 100

## 2019-04-23 MED ORDER — ONDANSETRON HCL 4 MG/2ML IJ SOLN
INTRAMUSCULAR | Status: AC
  Filled 2019-04-23: qty 2

## 2019-04-23 MED ORDER — SODIUM CHLORIDE 0.9 % IR SOLN
Status: DC | PRN
  Administered 2019-04-23: 3000 mL

## 2019-04-23 MED ORDER — MIDAZOLAM HCL 2 MG/2ML IJ SOLN
INTRAMUSCULAR | Status: AC
  Filled 2019-04-23: qty 2

## 2019-04-23 MED ORDER — LACTATED RINGERS IV SOLN
INTRAVENOUS | Status: DC
  Filled 2019-04-23: qty 1000

## 2019-04-23 MED ORDER — LIDOCAINE HCL 1 % IJ SOLN
INTRAMUSCULAR | Status: DC | PRN
  Administered 2019-04-23: 10 mL

## 2019-04-23 MED ORDER — FAMOTIDINE 20 MG PO TABS
20.0000 mg | ORAL_TABLET | Freq: Once | ORAL | Status: AC
  Administered 2019-04-23: 20 mg via ORAL
  Filled 2019-04-23: qty 1

## 2019-04-23 MED ORDER — KETOROLAC TROMETHAMINE 30 MG/ML IJ SOLN
INTRAMUSCULAR | Status: AC
  Filled 2019-04-23: qty 1

## 2019-04-23 MED ORDER — MIDAZOLAM HCL 5 MG/5ML IJ SOLN
INTRAMUSCULAR | Status: DC | PRN
  Administered 2019-04-23: 2 mg via INTRAVENOUS

## 2019-04-23 MED ORDER — PROPOFOL 10 MG/ML IV BOLUS
INTRAVENOUS | Status: AC
  Filled 2019-04-23: qty 40

## 2019-04-23 MED ORDER — DEXAMETHASONE SODIUM PHOSPHATE 10 MG/ML IJ SOLN
INTRAMUSCULAR | Status: DC | PRN
  Administered 2019-04-23: 10 mg via INTRAVENOUS

## 2019-04-23 MED ORDER — FENTANYL CITRATE (PF) 100 MCG/2ML IJ SOLN
INTRAMUSCULAR | Status: AC
  Filled 2019-04-23: qty 2

## 2019-04-23 MED ORDER — FENTANYL CITRATE (PF) 100 MCG/2ML IJ SOLN
INTRAMUSCULAR | Status: DC | PRN
  Administered 2019-04-23 (×2): 25 ug via INTRAVENOUS
  Administered 2019-04-23: 50 ug via INTRAVENOUS
  Administered 2019-04-23: 25 ug via INTRAVENOUS
  Administered 2019-04-23: 50 ug via INTRAVENOUS
  Administered 2019-04-23: 25 ug via INTRAVENOUS

## 2019-04-23 MED ORDER — HYDROCODONE-ACETAMINOPHEN 5-325 MG PO TABS
1.0000 | ORAL_TABLET | ORAL | Status: DC | PRN
  Filled 2019-04-23: qty 2

## 2019-04-23 MED ORDER — KETOROLAC TROMETHAMINE 30 MG/ML IJ SOLN
30.0000 mg | Freq: Once | INTRAMUSCULAR | Status: AC
  Administered 2019-04-23: 30 mg via INTRAVENOUS
  Filled 2019-04-23: qty 1

## 2019-04-23 MED ORDER — ACETAMINOPHEN 500 MG PO TABS
ORAL_TABLET | ORAL | Status: AC
  Filled 2019-04-23: qty 2

## 2019-04-23 MED ORDER — LIDOCAINE 2% (20 MG/ML) 5 ML SYRINGE
INTRAMUSCULAR | Status: AC
  Filled 2019-04-23: qty 5

## 2019-04-23 MED ORDER — ACETAMINOPHEN 500 MG PO TABS
1000.0000 mg | ORAL_TABLET | Freq: Once | ORAL | Status: AC
  Administered 2019-04-23: 1000 mg via ORAL
  Filled 2019-04-23: qty 2

## 2019-04-23 MED ORDER — FAMOTIDINE 20 MG PO TABS
ORAL_TABLET | ORAL | Status: AC
  Filled 2019-04-23: qty 1

## 2019-04-23 MED ORDER — ONDANSETRON HCL 4 MG/2ML IJ SOLN
4.0000 mg | Freq: Once | INTRAMUSCULAR | Status: DC | PRN
  Filled 2019-04-23: qty 2

## 2019-04-23 MED ORDER — ONDANSETRON HCL 4 MG/2ML IJ SOLN
INTRAMUSCULAR | Status: DC | PRN
  Administered 2019-04-23: 4 mg via INTRAVENOUS

## 2019-04-23 MED ORDER — FENTANYL CITRATE (PF) 100 MCG/2ML IJ SOLN
25.0000 ug | INTRAMUSCULAR | Status: DC | PRN
  Filled 2019-04-23: qty 1

## 2019-04-23 MED ORDER — PROPOFOL 10 MG/ML IV BOLUS
INTRAVENOUS | Status: DC | PRN
  Administered 2019-04-23: 150 mg via INTRAVENOUS

## 2019-04-23 MED ORDER — ACETAMINOPHEN 325 MG PO TABS
650.0000 mg | ORAL_TABLET | ORAL | Status: DC | PRN
  Filled 2019-04-23: qty 2

## 2019-04-23 MED ORDER — KETOROLAC TROMETHAMINE 15 MG/ML IJ SOLN
15.0000 mg | Freq: Once | INTRAMUSCULAR | Status: DC | PRN
  Filled 2019-04-23: qty 1

## 2019-04-23 MED ORDER — CEFAZOLIN SODIUM-DEXTROSE 2-4 GM/100ML-% IV SOLN
INTRAVENOUS | Status: AC
  Filled 2019-04-23: qty 100

## 2019-04-23 MED ORDER — DEXAMETHASONE SODIUM PHOSPHATE 10 MG/ML IJ SOLN
INTRAMUSCULAR | Status: AC
  Filled 2019-04-23: qty 1

## 2019-04-23 MED ORDER — LIDOCAINE 2% (20 MG/ML) 5 ML SYRINGE
INTRAMUSCULAR | Status: DC | PRN
  Administered 2019-04-23: 75 mg via INTRAVENOUS

## 2019-04-23 SURGICAL SUPPLY — 23 items
CANISTER SUCT 3000ML PPV (MISCELLANEOUS) ×2 IMPLANT
CATH ROBINSON RED A/P 16FR (CATHETERS) ×2 IMPLANT
COVER WAND RF STERILE (DRAPES) ×2 IMPLANT
DEVICE MYOSURE LITE (MISCELLANEOUS) IMPLANT
DEVICE MYOSURE REACH (MISCELLANEOUS) ×1 IMPLANT
DILATOR CANAL MILEX (MISCELLANEOUS) IMPLANT
ELECT REM PT RETURN 9FT ADLT (ELECTROSURGICAL)
ELECTRODE REM PT RTRN 9FT ADLT (ELECTROSURGICAL) IMPLANT
GAUZE 4X4 16PLY RFD (DISPOSABLE) ×2 IMPLANT
GLOVE BIO SURGEON STRL SZ 6.5 (GLOVE) ×2 IMPLANT
GLOVE BIOGEL PI IND STRL 7.0 (GLOVE) ×2 IMPLANT
GLOVE BIOGEL PI INDICATOR 7.0 (GLOVE) ×2
GOWN STRL REUS W/TWL LRG LVL3 (GOWN DISPOSABLE) ×4 IMPLANT
IV NS IRRIG 3000ML ARTHROMATIC (IV SOLUTION) ×4 IMPLANT
KIT PROCEDURE FLUENT (KITS) ×2 IMPLANT
MYOSURE XL FIBROID (MISCELLANEOUS)
PACK VAGINAL MINOR WOMEN LF (CUSTOM PROCEDURE TRAY) ×2 IMPLANT
PAD OB MATERNITY 4.3X12.25 (PERSONAL CARE ITEMS) ×2 IMPLANT
PAD PREP 24X48 CUFFED NSTRL (MISCELLANEOUS) ×2 IMPLANT
SEAL CERVICAL OMNI LOK (ABLATOR) IMPLANT
SEAL ROD LENS SCOPE MYOSURE (ABLATOR) ×2 IMPLANT
SYSTEM TISS REMOVAL MYOSURE XL (MISCELLANEOUS) IMPLANT
TOWEL OR 17X26 10 PK STRL BLUE (TOWEL DISPOSABLE) ×3 IMPLANT

## 2019-04-23 NOTE — Transfer of Care (Signed)
Immediate Anesthesia Transfer of Care Note  Patient: Brooke [name]  Procedure(s) Performed: DILATATION & CURETTAGE/HYSTEROSCOPY WITH MYOSURE (N/A Vagina )  Patient Location: PACU  Anesthesia Type:General  Level of Consciousness: awake, alert  and oriented  Airway & Oxygen Therapy: Patient Spontanous Breathing and Patient connected to nasal cannula oxygen  Post-op Assessment: Report given to RN and Post -op Vital signs reviewed and stable  Post vital signs: Reviewed and stable  Last Vitals:  Vitals Value Taken Time  BP 133/70 04/23/19 1015  Temp 36.6 C 04/23/19 1012  Pulse 66 04/23/19 1016  Resp 8 04/23/19 1016  SpO2 99 % 04/23/19 1016  Vitals shown include unvalidated device data.  Last Pain:  Vitals:   04/23/19 0841  TempSrc:   PainSc: 0-No pain      Patients Stated Pain Goal: 4 ([id number])  Complications: No apparent anesthesia complications

## 2019-04-23 NOTE — Anesthesia Postprocedure Evaluation (Signed)
Anesthesia Post Note  Patient: [name]  Procedure(s) Performed: DILATATION & CURETTAGE/HYSTEROSCOPY WITH MYOSURE (N/A Vagina )     Patient location during evaluation: PACU Anesthesia Type: General Level of consciousness: awake and alert Pain management: pain level controlled Vital Signs Assessment: post-procedure vital signs reviewed and stable Respiratory status: spontaneous breathing, nonlabored ventilation, respiratory function stable and patient connected to nasal cannula oxygen Cardiovascular status: blood pressure returned to baseline and stable Postop Assessment: no apparent nausea or vomiting Anesthetic complications: no    Last Vitals:  Vitals:   04/23/19 1156 04/23/19 1248  BP: 119/67 137/72  Pulse: 73 73  Resp: 16 16  Temp: 36.7 C 36.9 C  SpO2: 98% 97%    Last Pain:  Vitals:   04/23/19 1120  TempSrc:   PainSc: 2                  Jaclynne Baldo P Millie Shorb

## 2019-04-23 NOTE — Op Note (Signed)
Operative Note  04/23/2019  10:13 AM  PATIENT:  Brooke [name]  69 y.o. female  PRE-OPERATIVE DIAGNOSIS:  Post menopausal bleeding, Endometrial Polyp  POST-OPERATIVE DIAGNOSIS:  Post menopausal bleeding, Endometrial Polyp  PROCEDURE:  Procedure(s): HYSTEROSCOPY WITH MYOSURE EXCISION/DILATION AND CURETTAGE  SURGEON:  Surgeon(s): [name], MD  ANESTHESIA:   general  FINDINGS: Large Endometrial Polyp with a few areas of mildly thickened Endometrium.  DESCRIPTION OF OPERATION: Under general anesthesia with laryngeal mask, the patient is in lithotomy position.  She is prepped with Betadine on the suprapubic, vulvar and vaginal areas.  She is draped as usual.  Timeout was done.  The vaginal exam reveals an anteverted uterus, normal volume and mobile.  No adnexal mass.  A Patterson speculum was inserted in the vagina and the anterior lip of the cervix is grasped with a tenaculum.  A paracervical block is done with lidocaine 1% a total of 20 cc at 4 and 8:00.  Dilation of the cervix with Pratt dilators up to #21 without difficulty.  The hysteroscope was inserted in the intra uterine cavity.  Inspection reveals a very large polyp more than 3 cm in length originating from the right lateral wall of the uterine cavity.  All other areas of mildly thickened endometrium are present.  Pictures are taken.  The MyoSure reach is inserted. Complete excision of the endometrial polyp as well as the areas of thickened endometrium is done.  Hemostasis is adequate.  Pictures were taken after excisions.  The hysteroscope with MyoSure are then removed.  Systematic curettage of the uterine cavity on all surfaces with a narrow sharp curette.  The curette is then removed.  Both specimens are sent together to pathology.  The tenaculum was removed from the cervix.  Hemostasis is adequate.  The speculum was removed.  The patient is brought to recovery room in good and stable status.  ESTIMATED BLOOD LOSS: 5  mL FLUID DEFICIT: 140 mL  Intake/Output Summary (Last 24 hours) at 04/23/2019 1013 Last data filed at 04/23/2019 1001 Gross per 24 hour  Intake 100 ml  Output 30 ml  Net 70 ml     BLOOD ADMINISTERED:none   LOCAL MEDICATIONS USED:  Lidocaine 1% Paracervical block  SPECIMEN:  Source of Specimen:  Excision material from endometrial polyp and thickened endometrium/Endometrial curettings.  DISPOSITION OF SPECIMEN:  PATHOLOGY  COUNTS:  YES  PLAN OF CARE: Transfer to PACU  Marie-Lyne LavoieMD10:13 AM

## 2019-04-23 NOTE — Anesthesia Procedure Notes (Signed)
Procedure Name: LMA Insertion Date/Time: 04/23/2019 9:36 AM Performed by: Lollie Sails, CRNA Pre-anesthesia Checklist: Patient identified, Emergency Drugs available, Suction available, Patient being monitored and Timeout performed Patient Re-evaluated:Patient Re-evaluated prior to induction Oxygen Delivery Method: Circle system utilized Preoxygenation: Pre-oxygenation with 100% oxygen Induction Type: IV induction Ventilation: Mask ventilation without difficulty LMA: LMA inserted LMA Size: 4.0 Number of attempts: 1 Placement Confirmation: positive ETCO2 and breath sounds checked- equal and bilateral Tube secured with: Tape Dental Injury: Teeth and Oropharynx as per pre-operative assessment

## 2019-04-23 NOTE — Anesthesia Preprocedure Evaluation (Addendum)
Anesthesia Evaluation  Patient identified by MRN, date of birth, ID band Patient awake    Reviewed: Allergy & Precautions, NPO status , Patient's Chart, lab work & pertinent test results  History of Anesthesia Complications (+) PONV and history of anesthetic complications  Airway Mallampati: III  TM Distance: >3 FB Neck ROM: Full    Dental no notable dental hx.    Pulmonary neg pulmonary ROS,    Pulmonary exam normal breath sounds clear to auscultation       Cardiovascular hypertension, Pt. on medications Normal cardiovascular exam Rhythm:Regular Rate:Normal     Neuro/Psych Anxiety negative neurological ROS     GI/Hepatic negative GI ROS, Neg liver ROS,   Endo/Other  negative endocrine ROS  Renal/GU negative Renal ROS     Musculoskeletal negative musculoskeletal ROS (+)   Abdominal (+) + obese,   Peds  Hematology negative hematology ROS (+)   Anesthesia Other Findings post menopausal bleeding, endometrial polyp  Reproductive/Obstetrics                           Anesthesia Physical Anesthesia Plan  ASA: II  Anesthesia Plan: General   Post-op Pain Management:    Induction: Intravenous  PONV Risk Score and Plan: 4 or greater and Midazolam, Dexamethasone, Ondansetron and Treatment may vary due to age or medical condition  Airway Management Planned: LMA  Additional Equipment:   Intra-op Plan:   Post-operative Plan: Extubation in OR  Informed Consent: I have reviewed the patients History and Physical, chart, labs and discussed the procedure including the risks, benefits and alternatives for the proposed anesthesia with the patient or authorized representative who has indicated his/her understanding and acceptance.     Dental advisory given  Plan Discussed with: CRNA  Anesthesia Plan Comments:        Anesthesia Quick Evaluation

## 2019-04-23 NOTE — H&P (Signed)
Brooke [name] is an 69 y.o. female. G0  RP: PMB/Endometrial Polyp on Tamoxifen for HSC/Myosure excision/D+C  HPI: Started having PMB on 02/08/2019, seen by Dr Phineas Real that same day.  Started on Tamoxifen 02/2018 for Breast Cancer.  Pertinent Gynecological History: Blood transfusions: none Sexually transmitted diseases: no past history Last pap: normal    Menstrual History: Patient's last menstrual period was 03/18/1997.    Past Medical History:  Diagnosis Date  . Anemia   . Anxiety   . Arthritis   . ASCUS of cervix with negative high risk HPV 12/2018  . Bilateral dry eyes   . Breast cancer, left breast ([place])   . Family history of breast cancer 02/02/2017  . Family history of breast cancer   . Family history of colon cancer 02/02/2017  . Family history of colon cancer   . Family history of prostate cancer   . Family history of prostate cancer in father 02/02/2017  . Family history of uterine cancer   . Fibroid   . History of kidney stones   . Hyperlipidemia   . Hypertension   . Melanoma of upper arm ([place]) 2009   RIGHT ARM   . Obesity (BMI 30.0-34.9)   . Osteopenia 09/2017   T score -1.9 max 8% / 0.8% statistically significant decline at right and left hip stable at the spine  . PONV (postoperative nausea and vomiting)   . Squamous carcinoma     of the skin  . UTI (urinary tract infection)   . Varicose veins of both lower extremities   . Vitamin D deficiency 07/2009   LOW VITAMIN D 29  . Vitamin deficiency 07/2008   VITAMIN D LOW 23    Past Surgical History:  Procedure Laterality Date  . ABDOMINAL SURGERY  1975   ovairan cyst   . APPENDECTOMY    . BREAST LUMPECTOMY WITH RADIOACTIVE SEED LOCALIZATION Right 09/25/2017   Procedure: RIGHT BREAST LUMPECTOMY WITH RADIOACTIVE SEED LOCALIZATION;  Surgeon: Jovita Kussmaul, MD;  Location: [facility];  Service: General;  Laterality: Right;  . BREAST SURGERY     bio  . CHOLECYSTECTOMY    . DERMOID CYST  EXCISION    . DILATION  AND CURETTAGE OF UTERUS  1998  . GALLBLADDER SURGERY    . HYSTEROSCOPY    . insertion  port a cath  03/02/2016  . MASTECTOMY MODIFIED RADICAL Left 09/25/2017   Procedure: LEFT MODIFIED RADICAL MASTECTOMY;  Surgeon: Jovita Kussmaul, MD;  Location: [facility];  Service: General;  Laterality: Left;  . PORTACATH PLACEMENT N/A 03/02/2017   Procedure: INSERTION PORT-A-CATH;  Surgeon: Jovita Kussmaul, MD;  Location: [facility];  Service: General;  Laterality: N/A;  . SKIN BIOPSY    . SKIN CANCER EXCISION  2010,2011   2010-RIGHT ARM, 2011-LEFT LOWER LEG.-DR Rolm Bookbinder DERM. DR. Sarajane Jews IS HER DERM SURGEON.    Family History  Problem Relation Age of Onset  . Diabetes Father   . Hypertension Father   . Heart disease Father   . Prostate cancer Father 42  . Arthritis Father   . Breast cancer Sister 42       again at age 49, GT results unk  . Skin cancer Sister   . Colon cancer Paternal Grandfather 21  . Hypertension Mother   . Heart failure Mother   . Uterine cancer Sister 61       stage IV  . Breast cancer Cousin        mat  first cousin dx under 69    Social History:  reports that she has never smoked. She has never used smokeless tobacco. She reports that she does not drink alcohol or use drugs.  Allergies:  Allergies  Allergen Reactions  . Neosporin [Neomycin-Bacitracin Zn-Polymyx] Swelling, Rash and Other (See Comments)    Blisters  . Benzalkonium Chloride Other (See Comments)    Redness (Pt is unaware)  It works by killing microorganisms and inhibiting their future growth, and for this reason frequently appears as an ingredient in antibacterial hand wipes, antiseptic creams and anti-itch ointments.  . Latex Rash    Medications Prior to Admission  Medication Sig Dispense Refill Last Dose  . losartan (COZAAR) 25 MG tablet Take 12.5 mg by mouth daily.   04/22/2019 at Unknown time  . Propylene Glycol (SYSTANE COMPLETE) 0.6 % SOLN Place 1 drop into both eyes daily.   04/22/2019 at Unknown time   . tamoxifen (NOLVADEX) 20 MG tablet TAKE 1 TABLET BY MOUTH EVERY DAY 90 tablet 1 04/22/2019 at Unknown time  . vitamin C (ASCORBIC ACID) 500 MG tablet Take 1,000 mg by mouth daily.    04/22/2019 at Unknown time  . VITAMIN D, CHOLECALCIFEROL, PO Take 6,000 Units by mouth daily.    04/22/2019 at Unknown time  . vitamin E 600 UNIT capsule Take 400 Units by mouth 2 (two) times a week.    Past Week at Unknown time  . hydrochlorothiazide (HYDRODIURIL) 12.5 MG tablet Take 1 tablet (12.5 mg total) by mouth daily. (Patient not taking: Reported on 04/17/2019) 90 tablet 1     REVIEW OF SYSTEMS: A ROS was performed and pertinent positives and negatives are included in the history.  GENERAL: No fevers or chills. HEENT: No change in vision, no earache, sore throat or sinus congestion. NECK: No pain or stiffness. CARDIOVASCULAR: No chest pain or pressure. No palpitations. PULMONARY: No shortness of breath, cough or wheeze. GASTROINTESTINAL: No abdominal pain, nausea, vomiting or diarrhea, melena or bright red blood per rectum. GENITOURINARY: No urinary frequency, urgency, hesitancy or dysuria. MUSCULOSKELETAL: No joint or muscle pain, no back pain, no recent trauma. DERMATOLOGIC: No rash, no itching, no lesions. ENDOCRINE: No polyuria, polydipsia, no heat or cold intolerance. No recent change in weight. HEMATOLOGICAL: No anemia or easy bruising or bleeding. NEUROLOGIC: No headache, seizures, numbness, tingling or weakness. PSYCHIATRIC: No depression, no loss of interest in normal activity or change in sleep pattern.     Blood pressure (!) 152/83, pulse 98, temperature 98.3 F (36.8 C), temperature source Oral, resp. rate 18, height 5\' 9"  (1.753 m), weight 104.8 kg, last menstrual period 03/18/1997.  Physical Exam:  See office notes   Results for orders placed or performed during the hospital encounter of 04/23/19 (from the past 24 hour(s))  CBC     Status: None   Collection Time: 04/23/19  8:30 AM  Result  Value Ref Range   WBC 5.9 4.0 - 10.5 K/uL   RBC 4.38 3.87 - 5.11 MIL/uL   Hemoglobin 13.0 12.0 - 15.0 g/dL   HCT 39.7 36.0 - 46.0 %   MCV 90.6 80.0 - 100.0 fL   MCH 29.7 26.0 - 34.0 pg   MCHC 32.7 30.0 - 36.0 g/dL   RDW 12.5 11.5 - 15.5 %   Platelets 247 150 - 400 K/uL   nRBC 0.0 0.0 - 0.2 %   Covid Negative  Pelvic US 02/27/2019: Ultrasound transvaginal shows uterus retroverted with 3 myomas measuring 48 mm pedunculated at  the fundus, 24 mm intramural and 13 mm subserosal. Endometrial echo 14 mm with multiple cystic changes. 3D ultrasound suggests 32 x 27 mm endometrial mass with feeder vessel identified. Left ovary atrophic in appearance. Right ovary with 2 small simple cysts 15 mm and 6 mm. Questionable hydrosalpinx adjacent to the ovary measuring 10 x 46 mm. No free fluid.   Assessment/Plan:  69 y.o. G0P0000   1. PMB (postmenopausal bleeding) Postmenopausal bleeding after being on tamoxifen for about a year.  Pelvic ultrasound showing a thickened endometrial lining with a probable endometrial polyp measured at 3.2 x 2.7 cm.  Decision to proceed with hysteroscopy, MyoSure excision and D&C. Patient understand what the procedure consists of and voiced agreement on the plan. Discussion with patient on how to organize the surgery given that she does not have anyone who could supervise her on the first postop day and overnight.  We will see if we can keep the patient under observation overnight and then she would find a ride home the next day.  2. Increased endometrial stripe thickness Endometrial lining thickened at 14 mm with multiple cystic changes.  On 3D ultrasound a probable endometrial polyp is present measuring 3.2 x 2.7 cm.  3. Endometrial polyp Endometrial lesion on 3D ultrasound measuring 3.2 x 2.7 cm compatible with an endometrial polyp.  4. History of breast cancer On tamoxifen for 1 year.                        Patient was counseled as to the risk of surgery to  include the following:  1. Infection (prohylactic antibiotics will be administered)  2. DVT/Pulmonary Embolism (prophylactic pneumo compression stockings will be used)  3.Trauma to internal organs requiring additional surgical procedure to repair any injury to internal organs requiring perhaps additional hospitalization days.  4.Hemmorhage requiring transfusion and blood products which carry risks such as     anaphylactic reaction, hepatitis and AIDS  Patient had received literature information on the procedure scheduled and all her questions were answered and fully accepts all risk.  Brooke [name] 04/23/2019, 8:59 AM

## 2019-04-24 LAB — SURGICAL PATHOLOGY

## 2019-04-24 NOTE — Discharge Instructions (Signed)
Hysteroscopy, Care After This sheet gives you information about how to care for yourself after your procedure. Your health care provider may also give you more specific instructions. If you have problems or questions, contact your health care provider. What can I expect after the procedure? After the procedure, it is common to have:  Cramping.  Bleeding. This can vary from light spotting to menstrual-like bleeding. Follow these instructions at home: Activity  Rest for 1-2 days after the procedure.  Do not douche, use tampons, or have sex for 2 weeks after the procedure, or until your health care provider approves.  Do not drive for 24 hours after the procedure, or for as long as told by your health care provider.  Do not drive, use heavy machinery, or drink alcohol while taking prescription pain medicines. Medicines   Take over-the-counter and prescription medicines only as told by your health care provider.  Do not take aspirin during recovery. It can increase the risk of bleeding. General instructions  Do not take baths, swim, or use a hot tub until your health care provider approves. Take showers instead of baths for 2 weeks, or for as long as told by your health care provider.  To prevent or treat constipation while you are taking prescription pain medicine, your health care provider may recommend that you: ? Drink enough fluid to keep your urine clear or pale yellow. ? Take over-the-counter or prescription medicines. ? Eat foods that are high in fiber, such as fresh fruits and vegetables, whole grains, and beans. ? Limit foods that are high in fat and processed sugars, such as fried and sweet foods.  Keep all follow-up visits as told by your health care provider. This is important. Contact a health care provider if:  You feel dizzy or lightheaded.  You feel nauseous.  You have abnormal vaginal discharge.  You have a rash.  You have pain that does not get better with  medicine.  You have chills. Get help right away if:  You have bleeding that is heavier than a normal menstrual period.  You have a fever.  You have pain or cramps that get worse.  You develop new abdominal pain.  You faint.  You have pain in your shoulders.  You have shortness of breath. Summary  After the procedure, you may have cramping and some vaginal bleeding.  Do not douche, use tampons, or have sex for 2 weeks after the procedure, or until your health care provider approves.  Do not take baths, swim, or use a hot tub until your health care provider approves. Take showers instead of baths for 2 weeks, or for as long as told by your health care provider.  Report any unusual symptoms to your health care provider.  Keep all follow-up visits as told by your health care provider. This is important. This information is not intended to replace advice given to you by your health care provider. Make sure you discuss any questions you have with your health care provider. Document Revised: 01/27/2017 Document Reviewed: 03/15/2016 Elsevier Patient Education  2020 [employer]  Post Anesthesia Home Care Instructions  Activity: Get plenty of rest for the remainder of the day. A responsible individual must stay with you for 24 hours following the procedure.  For the next 24 hours, DO NOT: -Drive a car -Operate machinery -Drink alcoholic beverages -Take any medication unless instructed by your physician -Make any legal decisions or sign important papers.  Meals: Start with liquid foods such   as gelatin or soup. Progress to regular foods as tolerated. Avoid greasy, spicy, heavy foods. If nausea and/or vomiting occur, drink only clear liquids until the nausea and/or vomiting subsides. Call your physician if vomiting continues.  Special Instructions/Symptoms: Your throat may feel dry or sore from the anesthesia or the breathing tube placed in your throat during surgery. If this  causes discomfort, gargle with warm salt water. The discomfort should disappear within 24 hours.  If you had a scopolamine patch placed behind your ear for the management of post- operative nausea and/or vomiting:  1. The medication in the patch is effective for 72 hours, after which it should be removed.  Wrap patch in a tissue and discard in the trash. Wash hands thoroughly with soap and water. 2. You may remove the patch earlier than 72 hours if you experience unpleasant side effects which may include dry mouth, dizziness or visual disturbances. 3. Avoid touching the patch. Wash your hands with soap and water after contact with the patch.     

## 2019-04-27 NOTE — Discharge Summary (Signed)
Physician Discharge Summary  Patient ID: Brooke [name] MRN: [id number] DOB/AGE: [birth year] 69 y.o.  Admit date: 04/23/2019 Discharge date: 04/27/2019  Admission Diagnoses: Postoperative state [Z98.890]   Discharge Diagnoses: Same Active Problems:   Postoperative state   Discharged Condition: good  Consults:None  Significant Diagnostic Studies: None  Treatments: Hysteroscopy/Myosure Excision/D+C  Vitals:   04/24/19 0500 04/24/19 0848  BP: 120/64 (!) (P) 168/68  Pulse: 62 (P) 100  Resp: 16 (P) 18  Temp: 98.1 F (36.7 C) (P) 98.6 F (37 C)  SpO2: 98% (P) 97%     No intake/output data recorded.   Hospital Course: Good  Discharge Exam: Normal  Disposition: Discharge disposition: 01-Home or Self Care       Discharge Instructions    Discharge patient   Complete by: As directed    Discharge disposition: 01-Home or Self Care   Discharge patient date: 04/24/2019       Allergies as of 04/24/2019      Reactions   Neosporin [neomycin-bacitracin Zn-polymyx] Swelling, Rash, Other (See Comments)   Blisters   Benzalkonium Chloride Other (See Comments)   Redness (Pt is unaware)  It works by killing microorganisms and inhibiting their future growth, and for this reason frequently appears as an ingredient in antibacterial hand wipes, antiseptic creams and anti-itch ointments.   Latex Rash      Medication List    TAKE these medications   hydrochlorothiazide 12.5 MG tablet Commonly known as: HYDRODIURIL Take 1 tablet (12.5 mg total) by mouth daily.   losartan 25 MG tablet Commonly known as: COZAAR Take 12.5 mg by mouth daily.   Systane Complete 0.6 % Soln Generic drug: Propylene Glycol Place 1 drop into both eyes daily.   tamoxifen 20 MG tablet Commonly known as: NOLVADEX TAKE 1 TABLET BY MOUTH EVERY DAY   vitamin C 500 MG tablet Commonly known as: ASCORBIC ACID Take 1,000 mg by mouth daily.   VITAMIN D (CHOLECALCIFEROL) PO Take 6,000 Units by mouth  daily.   vitamin E 600 UNIT capsule Take 400 Units by mouth 2 (two) times a week.        Follow-up Information    [name], MD Follow up in 3 week(s).   Specialty: Obstetrics and Gynecology Contact information: [address] [place] [address] [phone]            Signed: [name] 04/27/2019, 2:15 PM

## 2019-05-08 ENCOUNTER — Other Ambulatory Visit: Payer: Self-pay | Admitting: Family Medicine

## 2019-05-13 ENCOUNTER — Other Ambulatory Visit: Payer: Self-pay

## 2019-05-14 ENCOUNTER — Encounter: Payer: Self-pay | Admitting: Obstetrics & Gynecology

## 2019-05-14 ENCOUNTER — Ambulatory Visit (INDEPENDENT_AMBULATORY_CARE_PROVIDER_SITE_OTHER): Payer: Medicare PPO | Admitting: Obstetrics & Gynecology

## 2019-05-14 VITALS — BP 136/88

## 2019-05-14 DIAGNOSIS — Z853 Personal history of malignant neoplasm of breast: Secondary | ICD-10-CM | POA: Diagnosis not present

## 2019-05-14 DIAGNOSIS — N840 Polyp of corpus uteri: Secondary | ICD-10-CM | POA: Diagnosis not present

## 2019-05-14 DIAGNOSIS — Z9889 Other specified postprocedural states: Secondary | ICD-10-CM

## 2019-05-14 DIAGNOSIS — Z09 Encounter for follow-up examination after completed treatment for conditions other than malignant neoplasm: Secondary | ICD-10-CM

## 2019-05-14 NOTE — Progress Notes (Signed)
    MALLIKA LEVITON 1951/02/09 [id number]        69 y.o.  G0   RP: Postop of HSC/Myosure Excision/D+C 04/23/2019  HPI: Breast Cancer Stage 3 on Tamoxifen x 2020.  PMB.  HSC/Myosure Excision/D+C on 04/23/2019.  Large Endometrioid Polyp excised.  No vaginal bleeding since then.  No pelvic pain.  No vaginal d/c.  Urine/BMs normal.  No fever.   OB History  Gravida Para Term Preterm AB Living  0 0 0 0 0 0  SAB TAB Ectopic Multiple Live Births  0 0 0 0      Past medical history,surgical history, problem list, medications, allergies, family history and social history were all reviewed and documented in the EPIC chart.   Directed ROS with pertinent positives and negatives documented in the history of present illness/assessment and plan.  Exam:  Vitals:   05/14/19 1412  BP: 136/88   General appearance:  Normal  Abdomen: Normal  Gynecologic exam: Vulva normal.  Bimanual exam:  Uterus AV, normal volume, mobile, NT.  No adnexal mass/NT bilaterally  Patho 04/23/2019: FINAL MICROSCOPIC DIAGNOSIS:   A. ENDOMETRIUM, CURETTINGS AND POLYPECTOMY:  - Endometrioid-type polyp, benign.    Assessment/Plan:  69 y.o. G0  1. Status post gynecological surgery, follow-up exam Very good postop healing with no complication.  Pathology benign, patient reassured.  2. Endometrial polyp Given that patient is on tamoxifen with the recent excision of an endometrial polyp, decision to proceed with a pelvic ultrasound annually to evaluate the endometrial lining. - US Transvaginal Non-OB; Future  3. History of breast cancer In the context of history of breast cancer will also do a pelvic ultrasound annually to evaluate the ovaries. - US Transvaginal Non-OB; Future  [name] MD, 2:39 PM 05/14/2019

## 2019-05-23 ENCOUNTER — Encounter: Payer: Self-pay | Admitting: Obstetrics & Gynecology

## 2019-05-23 ENCOUNTER — Ambulatory Visit: Payer: Medicare PPO | Attending: Internal Medicine

## 2019-05-23 DIAGNOSIS — Z23 Encounter for immunization: Secondary | ICD-10-CM

## 2019-05-23 NOTE — Progress Notes (Signed)
   Covid-19 Vaccination Clinic  Name:  Brooke [name]    MRN: [id number] DOB: [birth year]  05/23/2019  [name] was observed post Covid-19 immunization for 15 minutes without incident. She was provided with Vaccine Information Sheet and instruction to access the V-Safe system.   [name] was instructed to call 911 with any severe reactions post vaccine: Marland Kitchen Difficulty breathing  . Swelling of face and throat  . A fast heartbeat  . A bad rash all over body  . Dizziness and weakness   Immunizations Administered    Name Date Dose VIS Date Route   Pfizer COVID-19 Vaccine 05/23/2019  9:02 AM 0.3 mL 02/08/2019 Intramuscular   Manufacturer: [place]   Lot: [id number]   [place]: [id number]

## 2019-05-23 NOTE — Patient Instructions (Signed)
1. Status post gynecological surgery, follow-up exam Very good postop healing with no complication.  Pathology benign, patient reassured.  2. Endometrial polyp Given that patient is on tamoxifen with the recent excision of an endometrial polyp, decision to proceed with a pelvic ultrasound annually to evaluate the endometrial lining. - US Transvaginal Non-OB; Future  3. History of breast cancer In the context of history of breast cancer will also do a pelvic ultrasound annually to evaluate the ovaries. - US Transvaginal Non-OB; Future  Brooke [name], it was a pleasure seeing you today!

## 2019-06-12 ENCOUNTER — Inpatient Hospital Stay (HOSPITAL_BASED_OUTPATIENT_CLINIC_OR_DEPARTMENT_OTHER): Payer: Medicare PPO | Admitting: Oncology

## 2019-06-12 ENCOUNTER — Other Ambulatory Visit: Payer: Medicare PPO

## 2019-06-12 ENCOUNTER — Other Ambulatory Visit: Payer: Self-pay

## 2019-06-12 ENCOUNTER — Ambulatory Visit: Payer: Medicare PPO | Admitting: Oncology

## 2019-06-12 ENCOUNTER — Inpatient Hospital Stay: Payer: Medicare PPO | Attending: Oncology

## 2019-06-12 VITALS — BP 142/91 | HR 111 | Temp 98.3°F | Resp 18 | Ht 69.0 in | Wt 233.2 lb

## 2019-06-12 DIAGNOSIS — C50812 Malignant neoplasm of overlapping sites of left female breast: Secondary | ICD-10-CM

## 2019-06-12 DIAGNOSIS — Z79899 Other long term (current) drug therapy: Secondary | ICD-10-CM | POA: Insufficient documentation

## 2019-06-12 DIAGNOSIS — C50212 Malignant neoplasm of upper-inner quadrant of left female breast: Secondary | ICD-10-CM | POA: Diagnosis not present

## 2019-06-12 DIAGNOSIS — Z833 Family history of diabetes mellitus: Secondary | ICD-10-CM | POA: Diagnosis not present

## 2019-06-12 DIAGNOSIS — Z170 Estrogen receptor positive status [ER+]: Secondary | ICD-10-CM

## 2019-06-12 DIAGNOSIS — Z803 Family history of malignant neoplasm of breast: Secondary | ICD-10-CM | POA: Diagnosis not present

## 2019-06-12 DIAGNOSIS — Z9012 Acquired absence of left breast and nipple: Secondary | ICD-10-CM | POA: Insufficient documentation

## 2019-06-12 DIAGNOSIS — Z8249 Family history of ischemic heart disease and other diseases of the circulatory system: Secondary | ICD-10-CM | POA: Insufficient documentation

## 2019-06-12 DIAGNOSIS — Z8349 Family history of other endocrine, nutritional and metabolic diseases: Secondary | ICD-10-CM | POA: Diagnosis not present

## 2019-06-12 DIAGNOSIS — Z8049 Family history of malignant neoplasm of other genital organs: Secondary | ICD-10-CM | POA: Diagnosis not present

## 2019-06-12 DIAGNOSIS — M8580 Other specified disorders of bone density and structure, unspecified site: Secondary | ICD-10-CM | POA: Diagnosis not present

## 2019-06-12 DIAGNOSIS — Z923 Personal history of irradiation: Secondary | ICD-10-CM | POA: Diagnosis not present

## 2019-06-12 DIAGNOSIS — Z8042 Family history of malignant neoplasm of prostate: Secondary | ICD-10-CM | POA: Insufficient documentation

## 2019-06-12 DIAGNOSIS — D0361 Melanoma in situ of right upper limb, including shoulder: Secondary | ICD-10-CM

## 2019-06-12 DIAGNOSIS — Z79810 Long term (current) use of selective estrogen receptor modulators (SERMs): Secondary | ICD-10-CM | POA: Insufficient documentation

## 2019-06-12 DIAGNOSIS — Z8261 Family history of arthritis: Secondary | ICD-10-CM | POA: Insufficient documentation

## 2019-06-12 DIAGNOSIS — Z85828 Personal history of other malignant neoplasm of skin: Secondary | ICD-10-CM | POA: Diagnosis not present

## 2019-06-12 DIAGNOSIS — E559 Vitamin D deficiency, unspecified: Secondary | ICD-10-CM

## 2019-06-12 LAB — CBC WITH DIFFERENTIAL/PLATELET
Abs Immature Granulocytes: 0.01 10*3/uL (ref 0.00–0.07)
Basophils Absolute: 0 10*3/uL (ref 0.0–0.1)
Basophils Relative: 0 %
Eosinophils Absolute: 0.1 10*3/uL (ref 0.0–0.5)
Eosinophils Relative: 1 %
HCT: 38.8 % (ref 36.0–46.0)
Hemoglobin: 12.6 g/dL (ref 12.0–15.0)
Immature Granulocytes: 0 %
Lymphocytes Relative: 19 %
Lymphs Abs: 0.8 10*3/uL (ref 0.7–4.0)
MCH: 29.8 pg (ref 26.0–34.0)
MCHC: 32.5 g/dL (ref 30.0–36.0)
MCV: 91.7 fL (ref 80.0–100.0)
Monocytes Absolute: 0.4 10*3/uL (ref 0.1–1.0)
Monocytes Relative: 8 %
Neutro Abs: 3.1 10*3/uL (ref 1.7–7.7)
Neutrophils Relative %: 72 %
Platelets: 236 10*3/uL (ref 150–400)
RBC: 4.23 MIL/uL (ref 3.87–5.11)
RDW: 13.2 % (ref 11.5–15.5)
WBC: 4.4 10*3/uL (ref 4.0–10.5)
nRBC: 0 % (ref 0.0–0.2)

## 2019-06-12 LAB — COMPREHENSIVE METABOLIC PANEL
ALT: 16 U/L (ref 0–44)
AST: 11 U/L — ABNORMAL LOW (ref 15–41)
Albumin: 4 g/dL (ref 3.5–5.0)
Alkaline Phosphatase: 62 U/L (ref 38–126)
Anion gap: 9 (ref 5–15)
BUN: 13 mg/dL (ref 8–23)
CO2: 24 mmol/L (ref 22–32)
Calcium: 9.4 mg/dL (ref 8.9–10.3)
Chloride: 108 mmol/L (ref 98–111)
Creatinine, Ser: 0.88 mg/dL (ref 0.44–1.00)
GFR calc Af Amer: >60 mL/min (ref >60)
GFR calc non Af Amer: >60 mL/min (ref >60)
Glucose, Bld: 138 mg/dL — ABNORMAL HIGH (ref 70–99)
Potassium: 4 mmol/L (ref 3.5–5.1)
Sodium: 141 mmol/L (ref 135–145)
Total Bilirubin: 0.5 mg/dL (ref 0.3–1.2)
Total Protein: 6.9 g/dL (ref 6.5–8.1)

## 2019-06-12 NOTE — Progress Notes (Signed)
Brooke [name]  [phone]) [phone] Fax:([phone]    ID: FRANCESS MULLEN DOB: 1950-11-04  MR#: [id number]  YOK#:[id number]  Patient Care Team: [name], MD as PCP - General (Family Medicine) Rolm Bookbinder, MD as Attending Physician (Dermatology) Magrinat, Virgie Dad, MD as Consulting Physician (Oncology) Kyung Rudd, MD as Consulting Physician (Radiation Oncology) Jovita Kussmaul, MD as Consulting Physician (General Surgery) Irene Limbo, MD as Consulting Physician (Plastic Surgery) Causey, Charlestine Massed, NP as Nurse Practitioner (Hematology and Oncology) [name], MD as Consulting Physician (Obstetrics and Gynecology) OTHER MD:   CHIEF COMPLAINT: Estrogen receptor positive breast cancer (s/p left mastectomy)  CURRENT TREATMENT: Tamoxifen   INTERVAL HISTORY: Jacky returns today for follow-up of her estrogen receptor positive breast cancer.  She continues on tamoxifen with fair tolerance.  However she had an episode of spotting which led to evaluation by gynecology. She underwent hysteroscopy on 04/23/2019 under Dr. Dellis Filbert. Pathology from the procedure ([id number]) showed a benign endometrioid-type polyp.  On 03/12/2019 she underwent diagnostic right mammography at [facility] showing a breast density category B.  There was no evidence of malignancy and in particular where a palpable lump in the right breast at 6:00 along the areolar margin had been noted no suspicious finding was noted.  REVIEW OF SYSTEMS: Jaiyanna has not been able to travel because of the pandemic.  She feels isolated.  She will have her second COVID-19 vaccine shot April 19.  She tells me her bowel movements have become "like concrete".  Dr. Ethlyn Gallery  suggested stool softeners and MiraLAX and I reinforced that.  Nevertheless the change in pattern since last September is notable and I think she would benefit from a colonoscopy, which is something she has never had.  She does  drink almost a gallon of water a day.  A detailed review of systems today was otherwise noncontributory    HISTORY OF CURRENT ILLNESS: From the original intake note:  Ella herself palpated a change in her left breast and brought this to medical attention.  On 01/18/2017 she underwent bilateral diagnostic mammography with tomography and bilateral breast ultrasonography at [facility].  The breast density was category B.  At the most recent exam was from December 2016.  In the right breast upper outer quadrant there was a 1 cm irregular mass associated with punctate calcification.  By ultrasound this was not well-defined, and it was biopsied on 01/26/2017 with tomographic guidance this showed a complex sclerosing lesion with the usual ductal hyperplasia.  The right axilla was sonographically  On the left mammography showed a 4 cm dense mass with indistinct margins in the upper inner quadrant.  Ultrasound defined a 3 cm irregular mass in the upper inner quadrant of the left breast which was palpable.  There was an abnormal lymph node in the left axilla.  Biopsy of the left mass and abnormal lymph node ([id number], on 01/26/2017) showed both to be involved by invasive ductal carcinoma, with some lobular features but E-cadherin positive, estrogen and progesterone receptors both 100% positive, with strong staining intensity, with MIB-1 between 15-20%.  There was no HER-2 amplification, the signals ratio being 1.31/1.58 and the number per cell 2.30/2.05  The patient's subsequent history is as detailed below.   PAST MEDICAL HISTORY: Past Medical History:  Diagnosis Date  . Anemia   . Anxiety   . Arthritis   . ASCUS of cervix with negative high risk HPV 12/2018  . Bilateral dry eyes   . Breast cancer,  left breast ([place])   . Family history of breast cancer 02/02/2017  . Family history of breast cancer   . Family history of colon cancer 02/02/2017  . Family history of colon cancer   . Family history of  prostate cancer   . Family history of prostate cancer in father 02/02/2017  . Family history of uterine cancer   . Fibroid   . History of kidney stones   . Hyperlipidemia   . Hypertension   . Melanoma of upper arm ([place]) 2009   RIGHT ARM   . Obesity (BMI 30.0-34.9)   . Osteopenia 09/2017   T score -1.9 max 8% / 0.8% statistically significant decline at right and left hip stable at the spine  . PONV (postoperative nausea and vomiting)   . Squamous carcinoma     of the skin  . UTI (urinary tract infection)   . Varicose veins of both lower extremities   . Vitamin D deficiency 07/2009   LOW VITAMIN D 29  . Vitamin deficiency 07/2008   VITAMIN D LOW 23    PAST SURGICAL HISTORY: Past Surgical History:  Procedure Laterality Date  . ABDOMINAL SURGERY  1975   ovairan cyst   . APPENDECTOMY    . BREAST LUMPECTOMY WITH RADIOACTIVE SEED LOCALIZATION Right 09/25/2017   Procedure: RIGHT BREAST LUMPECTOMY WITH RADIOACTIVE SEED LOCALIZATION;  Surgeon: Jovita Kussmaul, MD;  Location: [place];  Service: General;  Laterality: Right;  . BREAST SURGERY     bio  . CHOLECYSTECTOMY    . DERMOID CYST  EXCISION    . DILATATION & CURETTAGE/HYSTEROSCOPY WITH MYOSURE N/A 04/23/2019   Procedure: DILATATION & CURETTAGE/HYSTEROSCOPY WITH MYOSURE;  Surgeon: [name], MD;  Location: [place];  Service: Gynecology;  Laterality: N/A;  request to follow at 9:30am in [facility] block requests 45 minutes  . DILATION AND CURETTAGE OF UTERUS  1998  . GALLBLADDER SURGERY    . HYSTEROSCOPY    . insertion  port a cath  03/02/2016  . MASTECTOMY MODIFIED RADICAL Left 09/25/2017   Procedure: LEFT MODIFIED RADICAL MASTECTOMY;  Surgeon: Jovita Kussmaul, MD;  Location: [place];  Service: General;  Laterality: Left;  . PORTACATH PLACEMENT N/A 03/02/2017   Procedure: INSERTION PORT-A-CATH;  Surgeon: Jovita Kussmaul, MD;  Location: [place];  Service: General;  Laterality: N/A;  . SKIN BIOPSY    . SKIN CANCER  EXCISION  2010,2011   2010-RIGHT ARM, 2011-LEFT LOWER LEG.-DR Rolm Bookbinder DERM. DR. Sarajane Jews IS HER DERM SURGEON.    FAMILY HISTORY Family History  Problem Relation Age of Onset  . Diabetes Father   . Hypertension Father   . Heart disease Father   . Prostate cancer Father 78  . Arthritis Father   . Breast cancer Sister 62       again at age 60, GT results unk  . Skin cancer Sister   . Colon cancer Paternal Grandfather 71  . Hypertension Mother   . Heart failure Mother   . Uterine cancer Sister 54       stage IV  . Breast cancer Cousin        mat first cousin dx under 56   The patient's father was diagnosed with prostate cancer in his 97s and died at age 30 from complications of that disease.  The patient's mother died at age 33 with congestive heart failure.  The patient had no brothers, 3 sisters.  One sister had breast cancer at age  67 and again at age 81.  She has been genetically tested and was found to be BRCA not mutated in addition a paternal grandfather had colon cancer in his 60s.  There is no history of ovarian cancer in the family to the patient's knowledge.   GYNECOLOGIC HISTORY:  Patient's last menstrual period was 03/18/1997. Menarche age 24. The patient is GX P0.  She stopped having periods in 1999 and took hormone replacement approximately 5 years.   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  She is now retired.  She is single and lives alone with no pets.    ADVANCED DIRECTIVES: Not in place   HEALTH MAINTENANCE: Social History   Tobacco Use  . Smoking status: Never Smoker  . Smokeless tobacco: Never Used  Substance Use Topics  . Alcohol use: No  . Drug use: No    Colonoscopy: Never  PAP: 12/2018, minimal atypia  Bone density: 2017/osteopenia   Allergies  Allergen Reactions  . Neosporin [Neomycin-Bacitracin Zn-Polymyx] Swelling, Rash  and Other (See Comments)    Blisters  . Benzalkonium Chloride Other (See Comments)    Redness (Pt is unaware)  It works by killing microorganisms and inhibiting their future growth, and for this reason frequently appears as an ingredient in antibacterial hand wipes, antiseptic creams and anti-itch ointments.  . Latex Rash    Current Outpatient Medications  Medication Sig Dispense Refill  . hydrochlorothiazide (HYDRODIURIL) 12.5 MG tablet Take 1 tablet (12.5 mg total) by mouth daily. 90 tablet 1  . losartan (COZAAR) 25 MG tablet TAKE 1 TABLET(25 MG) BY MOUTH DAILY 90 tablet 1  . Propylene Glycol (SYSTANE COMPLETE) 0.6 % SOLN Place 1 drop into both eyes daily.    . tamoxifen (NOLVADEX) 20 MG tablet TAKE 1 TABLET BY MOUTH EVERY DAY 90 tablet 1  . vitamin C (ASCORBIC ACID) 500 MG tablet Take 1,000 mg by mouth daily.     Marland Kitchen VITAMIN D, CHOLECALCIFEROL, PO Take 6,000 Units by mouth daily.     . vitamin E 600 UNIT capsule Take 400 Units by mouth 2 (two) times a week.      No current facility-administered medications for this visit.    OBJECTIVE:  white woman in no acute distress  Vitals:   06/12/19 1417  BP: (!) 142/91  Pulse: (!) 111  Resp: 18  Temp: 98.3 F (36.8 C)  SpO2: 92%     Body mass index is 34.44 kg/m.   Wt Readings from Last 3 Encounters:  06/12/19 233 lb 3.2 oz (105.8 kg)  04/23/19 231 lb 1.6 oz (104.8 kg)  03/13/19 231 lb 1.6 oz (104.8 kg)  ECOG FS:1 - Symptomatic but completely ambulatory  Sclerae unicteric, EOMs intact Wearing a mask No cervical or supraclavicular adenopathy Lungs no rales or rhonchi Heart regular rate and rhythm Abd soft, nontender, positive bowel sounds MSK no focal spinal tenderness, no upper extremity lymphedema Neuro: nonfocal, well oriented, appropriate affect Breasts: The right breast has undergone lumpectomy.  There is no evidence of disease activity.  The left breast is status post mastectomy and radiation with no evidence of chest wall  recurrence.  The axillae are benign.   LAB RESULTS:  CMP     Component Value Date/Time   NA 141 06/12/2019 1401   NA 141 03/03/2017 1358   K 4.0 06/12/2019 1401   K 3.3 (L) 03/03/2017 1358   CL 108  06/12/2019 1401   CO2 24 06/12/2019 1401   CO2 28 03/03/2017 1358   GLUCOSE 138 (H) 06/12/2019 1401   GLUCOSE 107 03/03/2017 1358   BUN 13 06/12/2019 1401   BUN 12.7 03/03/2017 1358   CREATININE 0.88 06/12/2019 1401   CREATININE 0.94 09/12/2018 1354   CREATININE 0.9 03/03/2017 1358   CALCIUM 9.4 06/12/2019 1401   CALCIUM 9.4 03/03/2017 1358   PROT 6.9 06/12/2019 1401   PROT 6.5 03/03/2017 1358   ALBUMIN 4.0 06/12/2019 1401   ALBUMIN 3.7 03/03/2017 1358   AST 11 (L) 06/12/2019 1401   AST 43 (H) 09/12/2018 1354   AST 23 03/03/2017 1358   ALT 16 06/12/2019 1401   ALT 84 (H) 09/12/2018 1354   ALT 25 03/03/2017 1358   ALKPHOS 62 06/12/2019 1401   ALKPHOS 71 03/03/2017 1358   BILITOT 0.5 06/12/2019 1401   BILITOT 0.6 09/12/2018 1354   BILITOT 0.52 03/03/2017 1358   GFRNONAA >60 06/12/2019 1401   GFRNONAA >60 09/12/2018 1354   GFRAA >60 06/12/2019 1401   GFRAA >60 09/12/2018 1354    No results found for: TOTALPROTELP, ALBUMINELP, A1GS, A2GS, BETS, BETA2SER, GAMS, MSPIKE, SPEI  No results found for: Nils Pyle, [facility]  Lab Results  Component Value Date   WBC 4.4 06/12/2019   NEUTROABS 3.1 06/12/2019   HGB 12.6 06/12/2019   HCT 38.8 06/12/2019   MCV 91.7 06/12/2019   PLT 236 06/12/2019      Chemistry      Component Value Date/Time   NA 141 06/12/2019 1401   NA 141 03/03/2017 1358   K 4.0 06/12/2019 1401   K 3.3 (L) 03/03/2017 1358   CL 108 06/12/2019 1401   CO2 24 06/12/2019 1401   CO2 28 03/03/2017 1358   BUN 13 06/12/2019 1401   BUN 12.7 03/03/2017 1358   CREATININE 0.88 06/12/2019 1401   CREATININE 0.94 09/12/2018 1354   CREATININE 0.9 03/03/2017 1358      Component Value Date/Time   CALCIUM 9.4 06/12/2019 1401   CALCIUM 9.4  03/03/2017 1358   ALKPHOS 62 06/12/2019 1401   ALKPHOS 71 03/03/2017 1358   AST 11 (L) 06/12/2019 1401   AST 43 (H) 09/12/2018 1354   AST 23 03/03/2017 1358   ALT 16 06/12/2019 1401   ALT 84 (H) 09/12/2018 1354   ALT 25 03/03/2017 1358   BILITOT 0.5 06/12/2019 1401   BILITOT 0.6 09/12/2018 1354   BILITOT 0.52 03/03/2017 1358       No results found for: LABCA2  No components found for: JZPHXT056  No results for input(s): INR in the last 168 hours.  No results found for: LABCA2  No results found for: PVX480  No results found for: XKP537  No results found for: SMO707  No results found for: CA2729  No components found for: HGQUANT  No results found for: CEA1 / No results found for: CEA1   No results found for: AFPTUMOR  No results found for: CHROMOGRNA  No results found for: HGBA, HGBA2QUANT, HGBFQUANT, HGBSQUAN (Hemoglobinopathy evaluation)   No results found for: LDH  No results found for: IRON, TIBC, IRONPCTSAT (Iron and TIBC)  No results found for: FERRITIN  Urinalysis    Component Value Date/Time   COLORURINE YELLOW 05/29/2015 1113   APPEARANCEUR CLEAR 05/29/2015 1113   LABSPEC 1.023 05/29/2015 1113   PHURINE 5.5 05/29/2015 1113   GLUCOSEU NEGATIVE 05/29/2015 1113   GLUCOSEU NEGATIVE 11/05/2012 1559   HGBUR NEGATIVE 05/29/2015 1113  BILIRUBINUR neg 08/10/2018 1529   KETONESUR NEGATIVE 05/29/2015 1113   PROTEINUR Negative 08/10/2018 1529   PROTEINUR NEGATIVE 05/29/2015 1113   UROBILINOGEN 0.2 08/10/2018 1529   UROBILINOGEN 0.2 11/05/2012 1559   NITRITE neg 08/10/2018 1529   NITRITE NEGATIVE 05/29/2015 1113   LEUKOCYTESUR Negative 08/10/2018 1529    STUDIES: No results found.   ELIGIBLE FOR AVAILABLE RESEARCH PROTOCOL: no   ASSESSMENT: 69 y.o. [place] woman status post bilateral biopsies 01/26/2017, showing  (1) in the right breast, a complex sclerosing lesion, status post lumpectomy 09/25/2017, with no malignancy identified.  (2)  in the left breast, a cT2 pN1 invasive ductal carcinoma (with some lobular features but E-cadherin positive), grade 2, estrogen and progesterone receptor positive, HER-2 not amplified, with an MIB-1 of 15-20%  (a) breast MRI 02/04/2017 suggests a T3 N1 tumor  (3) started neoadjuvant cyclophosphamide/docetaxel 03/03/2017, discontinued after 1 cycle with very poor tolerance  (a) started cyclophosphamide/methotrexate/fluorouracil (CMF) 03/28/2017, repeated x7 cycles, last dose 08/01/2017  (4) status post left modified radical mastectomy 09/25/17 for a ypT5 ypN2, stage IIIA invasive ductal carcinoma, grade 2, again estrogen and progesterone receptor positive, HER-2 not amplified  (5) adjuvant radiation completed 12/21/2017  (a) capecitabine sensitization tolerated only the initial week of radiation (b) Site/dose: The patient initially received a dose of 50.4 Gy in 28 fractions to the left chest wall and left supraclavicular region. This was delivered using a 3-D conformal, 4 field technique. The patient then received a boost to the mastectomy scar. This delivered an additional 10 Gy in 5 fractions using an en face electron field. The total dose was 60.4 Gy.  ( 6) tamoxifen started 03/09/2018  (7) genetics testing 12/12/2017 through the Multi-Gene Panel offered by [facility] found no deleterious mutations in AIP, ALK, APC, ATM, AXIN2,BAP1,  BARD1, BLM, BMPR1A, BRCA1, BRCA2, BRIP1, CASR, CDC73, CDH1, CDK4, CDKN1B, CDKN1C, CDKN2A (p14ARF), CDKN2A (p16INK4a), CEBPA, CHEK2, CTNNA1, DICER1, DIS3L2, EGFR (c.2369C>T, p.Thr790Met variant only), EPCAM (Deletion/duplication testing only), FH, FLCN, GATA2, GPC3, GREM1 (Promoter region deletion/duplication testing only), HOXB13 (c.251G>A, p.Gly84Glu), HRAS, KIT, MAX, MEN1, MET, MITF (c.952G>A, p.Glu318Lys variant only), MLH1, MSH2, MSH3, MSH6, MUTYH, NBN, NF1, NF2, NTHL1, PALB2, PDGFRA, PHOX2B, PMS2, POLD1, POLE, POT1, PRKAR1A, PTCH1, PTEN, RAD50, RAD51C, RAD51D, RB1,  RECQL4, RET, RUNX1, SDHAF2, SDHA (sequence changes only), SDHB, SDHC, SDHD, SMAD4, SMARCA4, SMARCB1, SMARCE1, STK11, SUFU, TERC, TERT, TMEM127, TP53, TSC1, TSC2, VHL, WRN and WT1.   (8) Foundation 1 testing shows stable microsatellite status and 1 mutation/ Mb, TP53 mutations x2  (a) PD-L1 dated July 2019 negative   PLAN: Jayel is now a year and a half out from definitive surgery for her breast cancer with no evidence of disease recurrence.  This is favorable.  She is finding tamoxifen difficult but is managing to take it.  It is not clear that she would feel better with aromatase inhibitors.  We could consider fulvestrant and we discussed that today.  After much discussion she thought it would be best to continue tamoxifen at least for now.  She is going to have a repeat gynecologic evaluation in December.  I am going to obtain a bone density that month as well.  I will then see her in January and we can again discuss whether switching to an aromatase inhibitor may be something she wants to try or possibly again consider fulvestrant.  We discussed her foundation 1 results which gave [place] no immediate target but there are studies now regarding T p53 mutated breast cancers and possibly at  some point in the future that may bear fruit.  She knows to call for any other issue that may develop before the next visit.  Total encounter time 35 minutes.*   Magrinat, Virgie Dad, MD  06/12/19 6:12 PM Medical Oncology and Hematology [facility] [place], [place] [address] Tel. [phone]    Fax. [phone]   I, Wilburn Mylar, am acting as scribe for Dr. Virgie Dad. Magrinat.  I, Lurline Del MD, have reviewed the above documentation for accuracy and completeness, and I agree with the above.   *Total Encounter Time as defined by the Centers for Medicare and Medicaid Services includes, in addition to the face-to-face time of a patient visit (documented in the note  above) non-face-to-face time: obtaining and reviewing outside history, ordering and reviewing medications, tests or procedures, care coordination (communications with other health care professionals or caregivers) and documentation in the medical record.

## 2019-06-13 ENCOUNTER — Telehealth: Payer: Self-pay | Admitting: Oncology

## 2019-06-13 NOTE — Telephone Encounter (Signed)
Scheduled appts per 4/14 los. Left voicemail with new appt date and time. Sent msg to HIM to mail reminder letter and calendar.

## 2019-06-17 ENCOUNTER — Ambulatory Visit: Payer: Medicare PPO | Attending: Internal Medicine

## 2019-06-17 DIAGNOSIS — Z23 Encounter for immunization: Secondary | ICD-10-CM

## 2019-06-17 NOTE — Progress Notes (Signed)
   Covid-19 Vaccination Clinic  Name:  Brooke [name]    MRN: [id number] DOB: [birth year]  06/17/2019  [name] was observed post Covid-19 immunization for 15 minutes without incident. She was provided with Vaccine Information Sheet and instruction to access the V-Safe system.   [name] was instructed to call 911 with any severe reactions post vaccine: Marland Kitchen Difficulty breathing  . Swelling of face and throat  . A fast heartbeat  . A bad rash all over body  . Dizziness and weakness   Immunizations Administered    Name Date Dose VIS Date Route   Pfizer COVID-19 Vaccine 06/17/2019  8:58 AM 0.3 mL 04/24/2018 Intramuscular   Manufacturer: [place]   Lot: [id number]   [place]: [id number]

## 2019-07-10 ENCOUNTER — Other Ambulatory Visit: Payer: Self-pay

## 2019-07-10 ENCOUNTER — Encounter: Payer: Self-pay | Admitting: Obstetrics & Gynecology

## 2019-07-10 ENCOUNTER — Ambulatory Visit: Payer: Medicare PPO | Admitting: Obstetrics & Gynecology

## 2019-07-10 VITALS — BP 128/80

## 2019-07-10 DIAGNOSIS — Z853 Personal history of malignant neoplasm of breast: Secondary | ICD-10-CM | POA: Diagnosis not present

## 2019-07-10 DIAGNOSIS — Z8542 Personal history of malignant neoplasm of other parts of uterus: Secondary | ICD-10-CM

## 2019-07-10 DIAGNOSIS — N840 Polyp of corpus uteri: Secondary | ICD-10-CM | POA: Diagnosis not present

## 2019-07-10 NOTE — Progress Notes (Signed)
    Brooke [name] 1950/03/06 [id number]        68 y.o.  G0  RP: Questions about Brooke [name] and risks of Endometrial pathology  HPI: PMB 03/2019 for which patient was investigated and had a HSC/Myosure excision of Polyp and D+C.  Pathology came back Endometrioid-type polyp, benign.  Thin Endometrium otherwise.  No PMB since then.  Patient is pondering the risk of recurrence of an Endometrial Polyp, but even more so the risk of Endometrial Ca if she stays on Brooke [name] up to 7 yrs.  Her sister has Endometrial Ca.  Patient had genetic testing which was Negative.   OB History  Gravida Para Term Preterm AB Living  0 0 0 0 0 0  SAB TAB Ectopic Multiple Live Births  0 0 0 0      Past medical history,surgical history, problem list, medications, allergies, family history and social history were all reviewed and documented in the EPIC chart.   Directed ROS with pertinent positives and negatives documented in the history of present illness/assessment and plan.  Exam:  Vitals:   07/10/19 1212  BP: 128/80   General appearance:  Normal  Gyn exam deferred   Assessment/Plan:  69 y.o. G0  1. History of breast cancer On Brooke [name] x 02/2018.  Benefits of Brooke [name] in the treatment of her breast cancer versus other types of medication to be further discussed with Dr. Jana [name].  Given the findings at hysteroscopy in February 2021 of a benign endometrial polyp with no evidence of surrounding thickening of the endometrium, in particular no evidence of proliferative endometrium, no endometrial hyperplasia and no endometrial cancer, the patient was recommended to stay on Brooke [name] for now and reassess the endometrial lining by ultrasound in December 2021.  We will have that additional information to decide on the long-term use of Brooke [name].  Patient voiced understanding and agreement with the plan.  Will call for earlier evaluation if has recurrence of postmenopausal bleeding in the meantime.  2.  Endometrial polyp Benign Endometrioid Polyp excised in 04/2019.  Thin endometrium otherwise.  No recurrence of PMB.  Pelvic US to reassess the Endometrial line and the Ovaries in 01/2020.  Brooke Bruins MD, 12:35 PM 07/10/2019

## 2019-07-10 NOTE — Patient Instructions (Signed)
1. History of breast cancer On Tamoxifen x 02/2018.  Benefits of tamoxifen in the treatment of her breast cancer versus other types of medication to be further discussed with Dr. [name].  Given the findings at hysteroscopy in February 2021 of a benign endometrial polyp with no evidence of surrounding thickening of the endometrium, in particular no evidence of proliferative endometrium, no endometrial hyperplasia and no endometrial cancer, the patient was recommended to stay on tamoxifen for now and reassess the endometrial lining by ultrasound in December 2021.  We will have that additional information to decide on the long-term use of tamoxifen.  Patient voiced understanding and agreement with the plan.  Will call for earlier evaluation if has recurrence of postmenopausal bleeding in the meantime.  2. Endometrial polyp Benign Endometrioid Polyp excised in 04/2019.  Thin endometrium otherwise.  No recurrence of PMB.  Pelvic US to reassess the Endometrial line and the Ovaries in 01/2020.  Brooke [name], it was a pleasure seeing you today!

## 2019-07-12 ENCOUNTER — Institutional Professional Consult (permissible substitution): Payer: Medicare PPO | Admitting: Obstetrics & Gynecology

## 2019-07-17 DIAGNOSIS — L853 Xerosis cutis: Secondary | ICD-10-CM | POA: Diagnosis not present

## 2019-07-17 DIAGNOSIS — D2271 Melanocytic nevi of right lower limb, including hip: Secondary | ICD-10-CM | POA: Diagnosis not present

## 2019-07-17 DIAGNOSIS — I8311 Varicose veins of right lower extremity with inflammation: Secondary | ICD-10-CM | POA: Diagnosis not present

## 2019-07-17 DIAGNOSIS — I8312 Varicose veins of left lower extremity with inflammation: Secondary | ICD-10-CM | POA: Diagnosis not present

## 2019-07-17 DIAGNOSIS — I872 Venous insufficiency (chronic) (peripheral): Secondary | ICD-10-CM | POA: Diagnosis not present

## 2019-07-17 DIAGNOSIS — L218 Other seborrheic dermatitis: Secondary | ICD-10-CM | POA: Diagnosis not present

## 2019-07-17 DIAGNOSIS — D225 Melanocytic nevi of trunk: Secondary | ICD-10-CM | POA: Diagnosis not present

## 2019-07-17 DIAGNOSIS — D2272 Melanocytic nevi of left lower limb, including hip: Secondary | ICD-10-CM | POA: Diagnosis not present

## 2019-07-17 DIAGNOSIS — Z85820 Personal history of malignant melanoma of skin: Secondary | ICD-10-CM | POA: Diagnosis not present

## 2019-08-02 DIAGNOSIS — C50212 Malignant neoplasm of upper-inner quadrant of left female breast: Secondary | ICD-10-CM | POA: Diagnosis not present

## 2019-08-02 DIAGNOSIS — Z170 Estrogen receptor positive status [ER+]: Secondary | ICD-10-CM | POA: Diagnosis not present

## 2019-08-16 ENCOUNTER — Other Ambulatory Visit: Payer: Self-pay | Admitting: Pharmacist

## 2019-08-28 ENCOUNTER — Ambulatory Visit: Payer: Medicare PPO | Admitting: Internal Medicine

## 2019-08-28 ENCOUNTER — Ambulatory Visit (INDEPENDENT_AMBULATORY_CARE_PROVIDER_SITE_OTHER): Payer: Medicare PPO | Admitting: Obstetrics & Gynecology

## 2019-08-28 ENCOUNTER — Encounter: Payer: Self-pay | Admitting: Obstetrics & Gynecology

## 2019-08-28 ENCOUNTER — Other Ambulatory Visit: Payer: Self-pay

## 2019-08-28 VITALS — BP 130/80 | Temp 98.8°F

## 2019-08-28 DIAGNOSIS — R310 Gross hematuria: Secondary | ICD-10-CM

## 2019-08-28 DIAGNOSIS — N898 Other specified noninflammatory disorders of vagina: Secondary | ICD-10-CM | POA: Diagnosis not present

## 2019-08-28 DIAGNOSIS — R102 Pelvic and perineal pain: Secondary | ICD-10-CM | POA: Diagnosis not present

## 2019-08-28 DIAGNOSIS — R350 Frequency of micturition: Secondary | ICD-10-CM

## 2019-08-28 MED ORDER — SULFAMETHOXAZOLE-TRIMETHOPRIM 800-160 MG PO TABS: 1.0000 | ORAL_TABLET | Freq: Two times a day (BID) | ORAL | 0 refills | Status: AC

## 2019-08-28 MED ORDER — FLUCONAZOLE 150 MG PO TABS: 150.0000 mg | ORAL_TABLET | Freq: Once | ORAL | 1 refills | Status: AC

## 2019-08-28 NOTE — Progress Notes (Signed)
    Brooke [name] Aug 04, 1950 [id number]        69 y.o.  G0  RP: Blood in urine/urinary frequency  HPI: Blood in urine/urinary frequency.  Pelvic discomfort.  Vaginal itching.  No fever.  Abstinent.    OB History  Gravida Para Term Preterm AB Living  0 0 0 0 0 0  SAB TAB Ectopic Multiple Live Births  0 0 0 0      Past medical history,surgical history, problem list, medications, allergies, family history and social history were all reviewed and documented in the EPIC chart.   Directed ROS with pertinent positives and negatives documented in the history of present illness/assessment and plan.  Exam:  Vitals:   08/28/19 1549  BP: 130/80  Temp 98.8  General appearance:  Normal  Abdomen: Normal  Gynecologic exam: Vulva normal.  Speculum: Cervix and vagina normal.  Normal secretions.  Wet prep done.  Bimanual exam: Uterus anteverted, normal volume, nontender, mobile.  No adnexal mass felt.  Nontender bilaterally.  U/A: Yellow cloudy, protein negative, nitrite negative, white blood cells more than 60, red blood cells 3-10, many bacteria.  Pending urine culture. Wet prep Neg   Assessment/Plan:  69 y.o. G0  1. Urinary frequency Probable acute cystitis per clinical symptoms and urine analysis.  Will treat with Bactrim DS 1 tablet twice a day for 3 days.  Fluconazole after antibiotics for treatment or prevention of yeast vaginitis.  Usage of medications reviewed with patient and prescriptions sent to pharmacy. - Urinalysis,Complete w/RFL Culture  2. Gross hematuria As above.  3. Pelvic pain in female Pelvic pain and discomfort.  Pelvic exam within normal limits.  Will investigate further with a pelvic ultrasound. - US Transvaginal Non-OB; Future  Other orders - sulfamethoxazole-trimethoprim (BACTRIM DS) 800-160 MG tablet; Take 1 tablet by mouth 2 (two) times daily for 3 days. - fluconazole (DIFLUCAN) 150 MG tablet; Take 1 tablet (150 mg total) by mouth once for 1  dose.  [name] MD, 3:57 PM 08/28/2019

## 2019-08-28 NOTE — Progress Notes (Deleted)
No chief complaint on file.   HPI: Brooke [name] 69 y.o. come in for SDA  PCP appt NA  Pt hx of breast cancer melanoma  ROS: See pertinent positives and negatives per HPI.  Past Medical History:  Diagnosis Date  . Anemia   . Anxiety   . Arthritis   . ASCUS of cervix with negative high risk HPV 12/2018  . Bilateral dry eyes   . Breast cancer, left breast ([place])   . Family history of breast cancer 02/02/2017  . Family history of breast cancer   . Family history of colon cancer 02/02/2017  . Family history of colon cancer   . Family history of prostate cancer   . Family history of prostate cancer in father 02/02/2017  . Family history of uterine cancer   . Fibroid   . History of kidney stones   . Hyperlipidemia   . Hypertension   . Melanoma of upper arm ([place]) 2009   RIGHT ARM   . Obesity (BMI 30.0-34.9)   . Osteopenia 09/2017   T score -1.9 max 8% / 0.8% statistically significant decline at right and left hip stable at the spine  . PONV (postoperative nausea and vomiting)   . Squamous carcinoma     of the skin  . UTI (urinary tract infection)   . Varicose veins of both lower extremities   . Vitamin D deficiency 07/2009   LOW VITAMIN D 29  . Vitamin deficiency 07/2008   VITAMIN D LOW 23    Family History  Problem Relation Age of Onset  . Diabetes Father   . Hypertension Father   . Heart disease Father   . Prostate cancer Father 92  . Arthritis Father   . Breast cancer Sister 48       again at age 92, GT results unk  . Skin cancer Sister   . Colon cancer Paternal Grandfather 17  . Hypertension Mother   . Heart failure Mother   . Uterine cancer Sister 43       stage IV  . Breast cancer Cousin        mat first cousin dx under 19    Social History   Socioeconomic History  . Marital status: Single    Spouse name: Not on file  . Number of children: Not on file  . Years of education: Not on file  . Highest education level: Not on file  Occupational History  .  Not on file  Tobacco Use  . Smoking status: Never Smoker  . Smokeless tobacco: Never Used  Vaping Use  . Vaping Use: Never used  Substance and Sexual Activity  . Alcohol use: No  . Drug use: No  . Sexual activity: Not Currently    Birth control/protection: Post-menopausal    Comment: 1st intercourse 26yo-1 partner  Other Topics Concern  . Not on file  Social History Narrative  . Not on file   Social Determinants of Health   Financial Resource Strain:   . Difficulty of Paying Living Expenses:   Food Insecurity:   . Worried About Charity fundraiser in the Last Year:   . Arboriculturist in the Last Year:   Transportation Needs:   . Film/video editor (Medical):   Marland Kitchen Lack of Transportation (Non-Medical):   Physical Activity:   . Days of Exercise per Week:   . Minutes of Exercise per Session:   Stress:   . Feeling of Stress :  Social Connections:   . Frequency of Communication with Friends and Family:   . Frequency of Social Gatherings with Friends and Family:   . Attends Religious Services:   . Active Member of Clubs or Organizations:   . Attends Archivist Meetings:   Marland Kitchen Marital Status:     Outpatient Medications Prior to Visit  Medication Sig Dispense Refill  . hydrochlorothiazide (HYDRODIURIL) 12.5 MG tablet Take 1 tablet (12.5 mg total) by mouth daily. (Patient not taking: Reported on 07/10/2019) 90 tablet 1  . losartan (COZAAR) 25 MG tablet TAKE 1 TABLET(25 MG) BY MOUTH DAILY (Patient taking differently: Taking 1/2 tablet) 90 tablet 1  . Propylene Glycol (SYSTANE COMPLETE) 0.6 % SOLN Place 1 drop into both eyes daily.    . tamoxifen (NOLVADEX) 20 MG tablet TAKE 1 TABLET BY MOUTH EVERY DAY 90 tablet 1  . vitamin C (ASCORBIC ACID) 500 MG tablet Take 1,000 mg by mouth daily.     Marland Kitchen VITAMIN D, CHOLECALCIFEROL, PO Take 6,000 Units by mouth daily.     . vitamin E 600 UNIT capsule Take 400 Units by mouth 2 (two) times a week.      No facility-administered  medications prior to visit.     EXAM:  LMP 03/18/1997   There is no height or weight on file to calculate BMI.  GENERAL: vitals reviewed and listed above, alert, oriented, appears well hydrated and in no acute distress HEENT: atraumatic, conjunctiva  clear, no obvious abnormalities on inspection of external nose and ears OP : no lesion edema or exudate  NECK: no obvious masses on inspection palpation  LUNGS: clear to auscultation bilaterally, no wheezes, rales or rhonchi, good air movement CV: HRRR, no clubbing cyanosis or  peripheral edema nl cap refill  MS: moves all extremities without noticeable focal  abnormality PSYCH: pleasant and cooperative, no obvious depression or anxiety Lab Results  Component Value Date   WBC 4.4 06/12/2019   HGB 12.6 06/12/2019   HCT 38.8 06/12/2019   PLT 236 06/12/2019   GLUCOSE 138 (H) 06/12/2019   CHOL 192 08/13/2018   TRIG 255.0 (H) 08/13/2018   HDL 51.00 08/13/2018   LDLDIRECT 115.0 08/13/2018   LDLCALC 135 (H) 12/16/2016   ALT 16 06/12/2019   AST 11 (L) 06/12/2019   NA 141 06/12/2019   K 4.0 06/12/2019   CL 108 06/12/2019   CREATININE 0.88 06/12/2019   BUN 13 06/12/2019   CO2 24 06/12/2019   TSH 3.99 08/13/2018   HGBA1C 5.7 08/13/2018   BP Readings from Last 3 Encounters:  07/10/19 128/80  06/12/19 (!) 142/91  05/14/19 136/88    ASSESSMENT AND PLAN:  Discussed the following assessment and plan:  No diagnosis found.  -Patient advised to return or notify health care team  if  new concerns arise.  There are no Patient Instructions on file for this visit.   Standley Brooking. Harrietta Incorvaia M.D.

## 2019-08-29 DIAGNOSIS — N898 Other specified noninflammatory disorders of vagina: Secondary | ICD-10-CM | POA: Diagnosis not present

## 2019-08-29 LAB — WET PREP FOR TRICH, YEAST, CLUE

## 2019-08-30 LAB — URINALYSIS, COMPLETE W/RFL CULTURE
Bilirubin Urine: NEGATIVE
Glucose, UA: NEGATIVE
Hyaline Cast: NONE SEEN /LPF
Ketones, ur: NEGATIVE
Nitrites, Initial: NEGATIVE
Protein, ur: NEGATIVE
Specific Gravity, Urine: 1.015 (ref 1.001–1.03)
WBC, UA: >=60 /HPF — AB (ref 0–5)
pH: 6 (ref 5.0–8.0)

## 2019-08-30 LAB — URINE CULTURE
MICRO NUMBER:: 10652553
SPECIMEN QUALITY:: ADEQUATE

## 2019-08-30 LAB — CULTURE INDICATED

## 2019-08-31 ENCOUNTER — Encounter: Payer: Self-pay | Admitting: Obstetrics & Gynecology

## 2019-08-31 NOTE — Patient Instructions (Signed)
1. Urinary frequency Probable acute cystitis per clinical symptoms and urine analysis.  Will treat with Bactrim DS 1 tablet twice a day for 3 days.  Fluconazole after antibiotics for treatment or prevention of yeast vaginitis.  Usage of medications reviewed with patient and prescriptions sent to pharmacy. - Urinalysis,Complete w/RFL Culture  2. Gross hematuria As above.  3. Pelvic pain in female Pelvic pain and discomfort.  Pelvic exam within normal limits.  Will investigate further with a pelvic ultrasound. - US Transvaginal Non-OB; Future  Other orders - sulfamethoxazole-trimethoprim (BACTRIM DS) 800-160 MG tablet; Take 1 tablet by mouth 2 (two) times daily for 3 days. - fluconazole (DIFLUCAN) 150 MG tablet; Take 1 tablet (150 mg total) by mouth once for 1 dose.  Brooke [name], it was a pleasure seeing you today!  I will inform you of your results as soon as they are available.

## 2019-09-20 ENCOUNTER — Other Ambulatory Visit: Payer: Self-pay | Admitting: Oncology

## 2019-10-01 ENCOUNTER — Telehealth: Payer: Self-pay | Admitting: Family Medicine

## 2019-10-01 NOTE — Telephone Encounter (Signed)
Left message for patient to schedule Annual Wellness Visit.  Please schedule with Nurse Health Advisor Shannon Crews, RN at [facility]  

## 2019-12-28 ENCOUNTER — Ambulatory Visit: Payer: Medicare PPO | Attending: Internal Medicine

## 2019-12-28 DIAGNOSIS — Z23 Encounter for immunization: Secondary | ICD-10-CM

## 2019-12-28 NOTE — Progress Notes (Signed)
° °  Covid-19 Vaccination Clinic  Name:  KALISI BEVILL    MRN: [id number] DOB: 04/18/1950  12/28/2019  Ms. Mayson was observed post Covid-19 immunization for 15 minutes without incident. She was provided with Vaccine Information Sheet and instruction to access the V-Safe system.   Ms. Severtson was instructed to call 911 with any severe reactions post vaccine:  Difficulty breathing   Swelling of face and throat   A fast heartbeat   A bad rash all over body   Dizziness and weakness

## 2019-12-30 ENCOUNTER — Telehealth: Payer: Self-pay | Admitting: Oncology

## 2019-12-30 NOTE — Telephone Encounter (Signed)
Called pt per 10/29 sch  msg - no answer. Left message for patient to call back to reschedule appt.

## 2020-01-01 ENCOUNTER — Telehealth: Payer: Self-pay | Admitting: Oncology

## 2020-01-01 NOTE — Telephone Encounter (Signed)
Rescheduled appointments per 11/3 phone call with patient. Patient requested change.

## 2020-01-14 ENCOUNTER — Encounter: Payer: Self-pay | Admitting: Obstetrics & Gynecology

## 2020-01-14 ENCOUNTER — Other Ambulatory Visit: Payer: Self-pay

## 2020-01-14 ENCOUNTER — Ambulatory Visit: Payer: Medicare PPO | Admitting: Obstetrics & Gynecology

## 2020-01-14 VITALS — BP 128/82 | Ht 67.75 in | Wt 223.0 lb

## 2020-01-14 DIAGNOSIS — Z23 Encounter for immunization: Secondary | ICD-10-CM | POA: Diagnosis not present

## 2020-01-14 DIAGNOSIS — Z853 Personal history of malignant neoplasm of breast: Secondary | ICD-10-CM

## 2020-01-14 DIAGNOSIS — M8588 Other specified disorders of bone density and structure, other site: Secondary | ICD-10-CM | POA: Diagnosis not present

## 2020-01-14 DIAGNOSIS — Z01419 Encounter for gynecological examination (general) (routine) without abnormal findings: Secondary | ICD-10-CM | POA: Diagnosis not present

## 2020-01-14 DIAGNOSIS — Z9289 Personal history of other medical treatment: Secondary | ICD-10-CM | POA: Diagnosis not present

## 2020-01-14 DIAGNOSIS — E6609 Other obesity due to excess calories: Secondary | ICD-10-CM

## 2020-01-14 DIAGNOSIS — M8589 Other specified disorders of bone density and structure, multiple sites: Secondary | ICD-10-CM

## 2020-01-14 DIAGNOSIS — Z780 Asymptomatic menopausal state: Secondary | ICD-10-CM

## 2020-01-14 DIAGNOSIS — Z1321 Encounter for screening for nutritional disorder: Secondary | ICD-10-CM | POA: Diagnosis not present

## 2020-01-14 DIAGNOSIS — Z6834 Body mass index (BMI) 34.0-34.9, adult: Secondary | ICD-10-CM

## 2020-01-14 DIAGNOSIS — Z1231 Encounter for screening mammogram for malignant neoplasm of breast: Secondary | ICD-10-CM | POA: Diagnosis not present

## 2020-01-14 NOTE — Progress Notes (Signed)
Brooke [name] [birth year] [id number]   History:    69 y.o. G0  RP:  Established patient presenting for annual gyn exam   HPI:  Postmenopause, on no HRT.  No PMB since surgery 04/23/2019 with HSC/Myosure Excision of Polyp/D+C.  No pelvic pain.  Abstinent.  Last Pap ASCUS/HPV HR Neg in 2020.  Breasts normal.  Breast Cancer Stage 3 on Tamoxifen x 2020.  Urine/BMs normal.  BMI 34.16.  Health labs with Fam MD.    Past medical history,surgical history, family history and social history were all reviewed and documented in the EPIC chart.  Gynecologic History Patient's last menstrual period was 03/18/1997.  Obstetric History OB History  Gravida Para Term Preterm AB Living  0 0 0 0 0 0  SAB TAB Ectopic Multiple Live Births  0 0 0 0       ROS: A ROS was performed and pertinent positives and negatives are included in the history.  GENERAL: No fevers or chills. HEENT: No change in vision, no earache, sore throat or sinus congestion. NECK: No pain or stiffness. CARDIOVASCULAR: No chest pain or pressure. No palpitations. PULMONARY: No shortness of breath, cough or wheeze. GASTROINTESTINAL: No abdominal pain, nausea, vomiting or diarrhea, melena or bright red blood per rectum. GENITOURINARY: No urinary frequency, urgency, hesitancy or dysuria. MUSCULOSKELETAL: No joint or muscle pain, no back pain, no recent trauma. DERMATOLOGIC: No rash, no itching, no lesions. ENDOCRINE: No polyuria, polydipsia, no heat or cold intolerance. No recent change in weight. HEMATOLOGICAL: No anemia or easy bruising or bleeding. NEUROLOGIC: No headache, seizures, numbness, tingling or weakness. PSYCHIATRIC: No depression, no loss of interest in normal activity or change in sleep pattern.     Exam:   BP 128/82   Ht 5' 7.75" (1.721 m)   Wt 223 lb (101.2 kg)   LMP 03/18/1997   BMI 34.16 kg/m   Body mass index is 34.16 kg/m.  General appearance : Well developed well nourished female. No acute distress HEENT:  Eyes: no retinal hemorrhage or exudates,  Neck supple, trachea midline, no carotid bruits, no thyroidmegaly Lungs: Clear to auscultation, no rhonchi or wheezes, or rib retractions  Heart: Regular rate and rhythm, no murmurs or gallops Breast:Examined in sitting and supine position were symmetrical in appearance, no palpable masses or tenderness,  no skin retraction, no nipple inversion, no nipple discharge, no skin discoloration, no axillary or supraclavicular lymphadenopathy Abdomen: no palpable masses or tenderness, no rebound or guarding Extremities: no edema or skin discoloration or tenderness  Pelvic: Vulva: Normal             Vagina: No gross lesions or discharge  Cervix: No gross lesions or discharge.  Pap reflex done.  Uterus  AV, normal size, shape and consistency, non-tender and mobile  Adnexa  Without masses or tenderness  Anus: Normal.  Rectal exam negative.   Assessment/Plan:  69 y.o. female for annual exam   1. Encounter for routine gynecological examination with Papanicolaou smear of cervix Normal gynecologic exam in menopause.  Pap reflex done.  Breast exam normal.  Rt Dx mammogram/Breast US January 2021 was Negative.  S/P Left Mastectomy.  We will schedule her first colonoscopy.  Health labs with family physician.  2. Postmenopause Well on no hormone replacement therapy.  No postmenopausal bleeding.  3. Osteopenia of multiple sites Vitamin D supplements, calcium intake of 1500 mg daily and regular weightbearing physical activities.  We will follow-up here for a bone density.  Vitamin D level  drawn today. - DG BONE DENSITY (DXA); Future - VITAMIN D 25 Hydroxy (Vit-D Deficiency, Fractures)  4. History of breast cancer Bilateral breast cancers.  Status post left mastectomy.  On tamoxifen since 2020.  Right diagnostic mammogram and ultrasound - January 2021.  5. Class 1 obesity due to excess calories without serious comorbidity with body mass index (BMI) of 34.0 to 34.9 in  adult Continue with a lower calorie/carb diet.  Aerobic activities 5 times a week and light weightlifting every 2 days.  [name] MD, 9:36 AM 01/14/2020

## 2020-01-15 LAB — VITAMIN D 25 HYDROXY (VIT D DEFICIENCY, FRACTURES): Vit D, 25-Hydroxy: 43 ng/mL (ref 30–100)

## 2020-01-16 ENCOUNTER — Encounter: Payer: Self-pay | Admitting: Obstetrics & Gynecology

## 2020-01-16 ENCOUNTER — Telehealth: Payer: Self-pay

## 2020-01-16 LAB — PAP IG W/ RFLX HPV ASCU

## 2020-01-16 NOTE — Telephone Encounter (Signed)
Patient called in wanting to see the Dr to discuss some things about her medication and also wanting to get a referral to GI. Wanting to get a colonoscopy  her oncologist suggested it and with her age she thinks it's time. Patient has an appt set for 02/19/20 but is wanting to know if provider would do the referral prior to that appt if possible, and if she thinks its ok. Patients doesn't want to wait till December to get the colonoscopy she is wanting to get it done as soon as possible. She is willing to speak with the provider if she has ay questions if it would help get her referral gong prior to her appt on 02/19/20   [facility] Dr. Juanita Craver  [phone] [address]  Fax number is : [phone]

## 2020-01-17 ENCOUNTER — Other Ambulatory Visit: Payer: Self-pay | Admitting: Family Medicine

## 2020-01-17 DIAGNOSIS — D2262 Melanocytic nevi of left upper limb, including shoulder: Secondary | ICD-10-CM | POA: Diagnosis not present

## 2020-01-17 DIAGNOSIS — D2272 Melanocytic nevi of left lower limb, including hip: Secondary | ICD-10-CM | POA: Diagnosis not present

## 2020-01-17 DIAGNOSIS — D225 Melanocytic nevi of trunk: Secondary | ICD-10-CM | POA: Diagnosis not present

## 2020-01-17 DIAGNOSIS — D2271 Melanocytic nevi of right lower limb, including hip: Secondary | ICD-10-CM | POA: Diagnosis not present

## 2020-01-17 DIAGNOSIS — Z1211 Encounter for screening for malignant neoplasm of colon: Secondary | ICD-10-CM

## 2020-01-17 DIAGNOSIS — L218 Other seborrheic dermatitis: Secondary | ICD-10-CM | POA: Diagnosis not present

## 2020-01-17 DIAGNOSIS — L570 Actinic keratosis: Secondary | ICD-10-CM | POA: Diagnosis not present

## 2020-01-17 DIAGNOSIS — L821 Other seborrheic keratosis: Secondary | ICD-10-CM | POA: Diagnosis not present

## 2020-01-17 DIAGNOSIS — D2261 Melanocytic nevi of right upper limb, including shoulder: Secondary | ICD-10-CM | POA: Diagnosis not present

## 2020-01-17 DIAGNOSIS — Z85820 Personal history of malignant melanoma of skin: Secondary | ICD-10-CM | POA: Diagnosis not present

## 2020-01-17 NOTE — Telephone Encounter (Signed)
Left a detailed message at the pts cell number with the information below and advised she call for an appt if needed.

## 2020-01-17 NOTE — Telephone Encounter (Signed)
Referral placed to GI for colonoscopy. This is great idea since cologuard wasn't completed. If she has other concerns we did have opening for today if interested.

## 2020-02-05 ENCOUNTER — Other Ambulatory Visit: Payer: Self-pay | Admitting: Obstetrics & Gynecology

## 2020-02-05 ENCOUNTER — Other Ambulatory Visit: Payer: Self-pay

## 2020-02-05 ENCOUNTER — Ambulatory Visit (INDEPENDENT_AMBULATORY_CARE_PROVIDER_SITE_OTHER): Payer: Medicare PPO

## 2020-02-05 DIAGNOSIS — Z13820 Encounter for screening for osteoporosis: Secondary | ICD-10-CM

## 2020-02-05 DIAGNOSIS — Z780 Asymptomatic menopausal state: Secondary | ICD-10-CM | POA: Diagnosis not present

## 2020-02-05 DIAGNOSIS — M8589 Other specified disorders of bone density and structure, multiple sites: Secondary | ICD-10-CM

## 2020-02-06 ENCOUNTER — Encounter: Payer: Medicare PPO | Admitting: Obstetrics & Gynecology

## 2020-02-13 ENCOUNTER — Telehealth: Payer: Self-pay

## 2020-02-13 ENCOUNTER — Ambulatory Visit (INDEPENDENT_AMBULATORY_CARE_PROVIDER_SITE_OTHER): Payer: Medicare PPO

## 2020-02-13 ENCOUNTER — Ambulatory Visit (INDEPENDENT_AMBULATORY_CARE_PROVIDER_SITE_OTHER): Payer: Medicare PPO | Admitting: Obstetrics & Gynecology

## 2020-02-13 ENCOUNTER — Other Ambulatory Visit: Payer: Self-pay

## 2020-02-13 DIAGNOSIS — L218 Other seborrheic dermatitis: Secondary | ICD-10-CM | POA: Diagnosis not present

## 2020-02-13 DIAGNOSIS — R9389 Abnormal findings on diagnostic imaging of other specified body structures: Secondary | ICD-10-CM

## 2020-02-13 DIAGNOSIS — D251 Intramural leiomyoma of uterus: Secondary | ICD-10-CM | POA: Diagnosis not present

## 2020-02-13 DIAGNOSIS — R319 Hematuria, unspecified: Secondary | ICD-10-CM | POA: Diagnosis not present

## 2020-02-13 DIAGNOSIS — N950 Postmenopausal bleeding: Secondary | ICD-10-CM | POA: Diagnosis not present

## 2020-02-13 DIAGNOSIS — C50812 Malignant neoplasm of overlapping sites of left female breast: Secondary | ICD-10-CM

## 2020-02-13 DIAGNOSIS — N854 Malposition of uterus: Secondary | ICD-10-CM | POA: Diagnosis not present

## 2020-02-13 DIAGNOSIS — Z853 Personal history of malignant neoplasm of breast: Secondary | ICD-10-CM

## 2020-02-13 DIAGNOSIS — Z170 Estrogen receptor positive status [ER+]: Secondary | ICD-10-CM | POA: Diagnosis not present

## 2020-02-13 DIAGNOSIS — D252 Subserosal leiomyoma of uterus: Secondary | ICD-10-CM | POA: Diagnosis not present

## 2020-02-13 DIAGNOSIS — N840 Polyp of corpus uteri: Secondary | ICD-10-CM

## 2020-02-13 MED ORDER — SULFAMETHOXAZOLE-TRIMETHOPRIM 800-160 MG PO TABS: 1.0000 | ORAL_TABLET | Freq: Two times a day (BID) | ORAL | 0 refills | Status: AC

## 2020-02-13 NOTE — Progress Notes (Signed)
    Brooke [name] [birth year] [id number]        69 y.o.  G0P0000   RP: Hematuria/PMB on Tamoxifen for Pelvic US  HPI: Postmenopause, on no HRT. Hematuria/PMB on Tamoxifen.  On 04/23/2019 had a HSC/Myosure Excision of Polyp/D+C.  No pelvic pain.  Abstinent.  Breasts normal.  Breast Cancer Stage 3 on Tamoxifen x 2020.   OB History  Gravida Para Term Preterm AB Living  0 0 0 0 0 0  SAB IAB Ectopic Multiple Live Births  0 0 0 0      Past medical history,surgical history, problem list, medications, allergies, family history and social history were all reviewed and documented in the EPIC chart.   Directed ROS with pertinent positives and negatives documented in the history of present illness/assessment and plan.  Exam:  There were no vitals filed for this visit. General appearance:  Normal  Pelvic US today: T/V images.  Anteverted uterus with fibroids.  The overall uterine size is measured at 7.43 x 3.92 x 3.2 cm.  A pedunculated fibroid is measured at 4.9 x 3.9 cm.  Intramural fibroid measured at 2 cm.  Subserosal fibroid stable.  Thickened echogenic endometrium with cystic spaces.  No mass seen.  The endometrial lining is measured at 9.36 mm.  Patient is on tamoxifen.  Right ovary with 2 residual small avascular simple cysts measured at 5 and 4 mm.  Left ovary appears negative.  Right adnexal tubular cystic space which is avascular and echo-free measured at 2 x 1.6 x 1.7 cm compatible with a right hydrosalpinx.  No adnexal mass otherwise.  No free fluid in the posterior cul-de-sac.  EBX Verbal consent obtained.  Speculum inserted in the vagina.  Betadine prep on the cervix.  Hurricaine spray of the cervix.  Anterior lip of cervix grasped with a tenaculum.  Os finder used.  Easy endometrial biopsy with suction scraping of all endometrial surfaces.  Adequate specimen obtained.  Sent to pathology.  Good hemostasis.  All instruments removed.  Well-tolerated by patient.  U/A: Yellow cloudy,  protein 1+, nitrites positive, white blood cells more than 60, red blood cells more than 60, bacteria many.  Urine culture pending.   Assessment/Plan:  69 y.o. G0  1. Postmenopausal bleeding Postmenopausal bleeding on tamoxifen.  Endometrial lining was thickened and cystic.  Endometrial biopsy done without difficulty.  Management per endometrial biopsy results.  Patient will discuss tamoxifen with oncology. - Pathology Report ([facility])  2. Hematuria, unspecified type Very likely acute cystitis per symptoms and urine analysis results.  Will treat with Bactrim DS 1 tablet twice a day for 3 days.  Pending urine culture. - Urinalysis,Complete w/RFL Culture  3. Malignant neoplasm of overlapping sites of left breast in female, estrogen receptor positive ([place]) Discussed tamoxifen with oncologist.  Other orders - sulfamethoxazole-trimethoprim (BACTRIM DS) 800-160 MG tablet; Take 1 tablet by mouth 2 (two) times daily for 3 days.  [name] MD, 11:11 AM 02/13/2020

## 2020-02-13 NOTE — Telephone Encounter (Signed)
TC from Pt. stating she discontinuing Tamoxifen until endometrial biopsy results come back. Dr. [name] notified.

## 2020-02-15 LAB — URINALYSIS, COMPLETE W/RFL CULTURE
Bilirubin Urine: NEGATIVE
Glucose, UA: NEGATIVE
Hyaline Cast: NONE SEEN /LPF
Ketones, ur: NEGATIVE
Nitrites, Initial: POSITIVE — AB
RBC / HPF: >=60 /HPF — AB (ref 0–2)
Specific Gravity, Urine: 1.01 (ref 1.001–1.03)
WBC, UA: >=60 /HPF — AB (ref 0–5)
pH: 6 (ref 5.0–8.0)

## 2020-02-15 LAB — URINE CULTURE
MICRO NUMBER:: 11326503
SPECIMEN QUALITY:: ADEQUATE

## 2020-02-15 LAB — CULTURE INDICATED

## 2020-02-17 ENCOUNTER — Telehealth: Payer: Self-pay

## 2020-02-17 ENCOUNTER — Other Ambulatory Visit: Payer: Self-pay | Admitting: Oncology

## 2020-02-17 LAB — PATHOLOGY REPORT

## 2020-02-17 LAB — TISSUE SPECIMEN

## 2020-02-17 NOTE — Telephone Encounter (Signed)
TC to Pt. Per Dr. [name] to inform her to continue not taking the tamoxifen until her visit on 03/11/20. Pt.verbalized understanding. No further problems or concerns noted.

## 2020-02-18 ENCOUNTER — Telehealth: Payer: Self-pay

## 2020-02-18 NOTE — Telephone Encounter (Signed)
Patient was in 02/13/20 and prescribed Septra DS bid x 3 days.  She called to report she was only able to take 3 of them due to being severely nauseated and feeling terrible.   Urine culture and sensitivity in chart.

## 2020-02-19 ENCOUNTER — Encounter: Payer: Self-pay | Admitting: Family Medicine

## 2020-02-19 ENCOUNTER — Ambulatory Visit: Payer: Medicare PPO | Admitting: Family Medicine

## 2020-02-19 ENCOUNTER — Other Ambulatory Visit: Payer: Self-pay

## 2020-02-19 VITALS — BP 124/50 | HR 94 | Temp 98.1°F | Ht 67.75 in | Wt 212.7 lb

## 2020-02-19 DIAGNOSIS — R531 Weakness: Secondary | ICD-10-CM

## 2020-02-19 DIAGNOSIS — E785 Hyperlipidemia, unspecified: Secondary | ICD-10-CM

## 2020-02-19 DIAGNOSIS — D649 Anemia, unspecified: Secondary | ICD-10-CM

## 2020-02-19 DIAGNOSIS — N3001 Acute cystitis with hematuria: Secondary | ICD-10-CM

## 2020-02-19 DIAGNOSIS — E559 Vitamin D deficiency, unspecified: Secondary | ICD-10-CM | POA: Diagnosis not present

## 2020-02-19 DIAGNOSIS — R002 Palpitations: Secondary | ICD-10-CM | POA: Diagnosis not present

## 2020-02-19 DIAGNOSIS — R739 Hyperglycemia, unspecified: Secondary | ICD-10-CM

## 2020-02-19 NOTE — Addendum Note (Signed)
Addended by: Shahram Alexopoulos L on: 02/19/2020 09:48 AM   Modules accepted: Orders  

## 2020-02-19 NOTE — Progress Notes (Signed)
Brooke [name] DOB: 1950/03/03 Encounter date: 02/19/2020  This is a 69 y.o. female who presents with Chief Complaint  Patient presents with  . Follow-up    History of present illness: Heart beating erratically; sometimes cold sweats, and sometimes feeling weak.   12/16: she had [place] through Dr. Dellis Filbert (prompted from vaginal bleeding in 01/2018; had surgery in 01/2019 where they removed polyp; then went ofr followup US) and had biopsy and was told to stop tamoxifen while awaiting results. She got message from Dr. [name] on Monday telling her to stop taking the tamoxifen. She was also dx with UTI - took 3 pills and it made her so sick she stopped taking it (bactrim)  Hasn't experienced heart beat like she is having right now. Feels like stone sitting in throat. Feels like heart rate is fast - like she went out running.   Has been trying to lose weight; but not thinking she had lost as much as she had.   No fever, wouldn't say shortness of breath; but does feel like she is beating differently.   On 12/15 - had blood in urine. She was having sx of UTI at that time. Urinary sx did improve with 3 pills.   Does feel weak. Has to sit down. She is frightened by way heart rate feels.    Allergies  Allergen Reactions  . Neosporin [Neomycin-Bacitracin Zn-Polymyx] Swelling, Rash and Other (See Comments)    Blisters  . Bactrim [Sulfamethoxazole-Trimethoprim]     Made her feel very sick  . Benzalkonium Chloride Other (See Comments)    Redness (Pt is unaware)  It works by killing microorganisms and inhibiting their future growth, and for this reason frequently appears as an ingredient in antibacterial hand wipes, antiseptic creams and anti-itch ointments.  . Latex Rash   Current Meds  Medication Sig  . losartan (COZAAR) 25 MG tablet TAKE 1 TABLET(25 MG) BY MOUTH DAILY (Patient taking differently: Taking 1/2 tablet)  . Propylene Glycol 0.6 % SOLN Place 1 drop into both eyes daily.  .  vitamin C (ASCORBIC ACID) 500 MG tablet Take 1,000 mg by mouth daily.   Marland Kitchen VITAMIN D, CHOLECALCIFEROL, PO Take 6,000 Units by mouth daily.   . vitamin E 600 UNIT capsule Take 400 Units by mouth 2 (two) times a week.   . [DISCONTINUED] tamoxifen (NOLVADEX) 20 MG tablet TAKE 1 TABLET BY MOUTH EVERY DAY    Review of Systems  Constitutional: Positive for fatigue. Negative for chills and fever.  Respiratory: Negative for cough, chest tightness, shortness of breath and wheezing.   Cardiovascular: Negative for chest pain, palpitations and leg swelling.    Objective:  BP (!) 124/50 (BP Location: Right Arm, Patient Position: Sitting, Cuff Size: Large)   Pulse 94   Temp 98.1 F (36.7 C) (Oral)   Ht 5' 7.75" (1.721 m)   Wt 212 lb 11.2 oz (96.5 kg)   LMP 03/18/1997   BMI 32.58 kg/m   Weight: 212 lb 11.2 oz (96.5 kg)   BP Readings from Last 3 Encounters:  02/19/20 (!) 124/50  01/14/20 128/82  08/28/19 130/80   Wt Readings from Last 3 Encounters:  02/19/20 212 lb 11.2 oz (96.5 kg)  01/14/20 223 lb (101.2 kg)  06/12/19 233 lb 3.2 oz (105.8 kg)    Physical Exam Constitutional:      General: She is not in acute distress.    Appearance: She is well-developed.  Neck:     Thyroid: No thyroid mass or  thyromegaly.  Cardiovascular:     Rate and Rhythm: Normal rate and regular rhythm.     Heart sounds: Normal heart sounds. No murmur heard. No friction rub.  Pulmonary:     Effort: Pulmonary effort is normal. No respiratory distress.     Breath sounds: Normal breath sounds. No wheezing or rales.  Musculoskeletal:     Right lower leg: No edema.     Left lower leg: No edema.  Neurological:     Mental Status: She is alert and oriented to person, place, and time.  Psychiatric:        Behavior: Behavior normal.     Assessment/Plan  1. Acute cystitis with hematuria Did not complete treatment, but would like to recheck labs before re-treating due to med sensitivity.  - Urinalysis with  Culture Reflex; Future  2. Palpitations Sinus rhythm. No acute changes. All beats are well conducted. - EKG 12-Lead - CBC with Differential/Platelet; Future - Comprehensive metabolic panel; Future - TSH; Future - T4, free; Future - T3, free; Future - D-dimer, quantitative (not at [facility]); Future  3. Weakness Will start with labwork; further eval pending above  4. Vitamin D insufficiency - VITAMIN D 25 Hydroxy (Vit-D Deficiency, Fractures); Future  5. Hyperlipidemia, unspecified hyperlipidemia type - Lipid panel; Future  6. Anemia, unspecified type - Iron, TIBC and Ferritin Panel; Future  7. Hyperglycemia - Hemoglobin A1c; Future    Return for pending lab results.     Micheline Rough, MD

## 2020-02-19 NOTE — Patient Instructions (Signed)
Monitor blood pressures at home. Let's have you start with 1/2 tablet of losartan tablet (12.5mg ) daily and continue to monitor. If you are regularly seeing pressures 384 systolic or higher then resume whole tablet.

## 2020-02-19 NOTE — Telephone Encounter (Signed)
Treat with Cipro 250 mg BID x 7 days.

## 2020-02-19 NOTE — Telephone Encounter (Signed)
I called Brooke [name] to let her know about antibiotic. Brooke [name] informed me she has had heart palpitations and weakness since Saturday. She only took 3 Septra with last one on Friday.  She saw Dr. Ethlyn Gallery, her PCP, this morning and she did EKG, full lab panel and rechecked urine. She was going to wait to treat U/a until result back. I went and discussed Brooke [name]'s ongoing symptoms and situation with Dr. Dellis Filbert and she feels it is unlikely the medication is causing all of these sx.  She is very comfortable with letting Dr. Ethlyn Gallery follow Brooke [name]'s results and treat u/a according to her recommendations.  Brooke [name] was advised of all this and is comfortable with this plan. NO medication was prescribed.

## 2020-02-20 ENCOUNTER — Telehealth: Payer: Self-pay | Admitting: *Deleted

## 2020-02-20 ENCOUNTER — Other Ambulatory Visit: Payer: Self-pay | Admitting: Family Medicine

## 2020-02-20 DIAGNOSIS — R319 Hematuria, unspecified: Secondary | ICD-10-CM

## 2020-02-20 DIAGNOSIS — R300 Dysuria: Secondary | ICD-10-CM

## 2020-02-20 LAB — IRON,TIBC AND FERRITIN PANEL
%SAT: 16 % (calc) (ref 16–45)
Ferritin: 298 ng/mL — ABNORMAL HIGH (ref 16–288)
Iron: 52 ug/dL (ref 45–160)
TIBC: 327 mcg/dL (calc) (ref 250–450)

## 2020-02-20 LAB — LIPID PANEL
Cholesterol: 148 mg/dL (ref <200)
HDL: 36 mg/dL — ABNORMAL LOW (ref >=50)
LDL Cholesterol (Calc): 87 mg/dL (calc)
Non-HDL Cholesterol (Calc): 112 mg/dL (calc) (ref <130)
Total CHOL/HDL Ratio: 4.1 (calc) (ref <5.0)
Triglycerides: 146 mg/dL (ref <150)

## 2020-02-20 LAB — COMPREHENSIVE METABOLIC PANEL
AG Ratio: 1.7 (calc) (ref 1.0–2.5)
ALT: 82 U/L — ABNORMAL HIGH (ref 6–29)
AST: 69 U/L — ABNORMAL HIGH (ref 10–35)
Albumin: 4.2 g/dL (ref 3.6–5.1)
Alkaline phosphatase (APISO): 148 U/L (ref 37–153)
BUN: 11 mg/dL (ref 7–25)
CO2: 23 mmol/L (ref 20–32)
Calcium: 9.4 mg/dL (ref 8.6–10.4)
Chloride: 101 mmol/L (ref 98–110)
Creat: 0.83 mg/dL (ref 0.50–0.99)
Globulin: 2.5 g/dL (calc) (ref 1.9–3.7)
Glucose, Bld: 129 mg/dL — ABNORMAL HIGH (ref 65–99)
Potassium: 4 mmol/L (ref 3.5–5.3)
Sodium: 136 mmol/L (ref 135–146)
Total Bilirubin: 0.6 mg/dL (ref 0.2–1.2)
Total Protein: 6.7 g/dL (ref 6.1–8.1)

## 2020-02-20 LAB — HEMOGLOBIN A1C
Hgb A1c MFr Bld: 6.2 % of total Hgb — ABNORMAL HIGH (ref <5.7)
Mean Plasma Glucose: 131 mg/dL
eAG (mmol/L): 7.3 mmol/L

## 2020-02-20 LAB — D-DIMER, QUANTITATIVE: D-Dimer, Quant: 2.9 mcg/mL FEU — ABNORMAL HIGH (ref <0.50)

## 2020-02-20 LAB — CBC WITH DIFFERENTIAL/PLATELET
Absolute Monocytes: 359 cells/uL (ref 200–950)
Basophils Absolute: 40 cells/uL (ref 0–200)
Basophils Relative: 0.7 %
Eosinophils Absolute: 17 cells/uL (ref 15–500)
Eosinophils Relative: 0.3 %
HCT: 37.5 % (ref 35.0–45.0)
Hemoglobin: 12.6 g/dL (ref 11.7–15.5)
Lymphs Abs: 633 cells/uL — ABNORMAL LOW (ref 850–3900)
MCH: 29.5 pg (ref 27.0–33.0)
MCHC: 33.6 g/dL (ref 32.0–36.0)
MCV: 87.8 fL (ref 80.0–100.0)
MPV: 9.4 fL (ref 7.5–12.5)
Monocytes Relative: 6.3 %
Neutro Abs: 4651 cells/uL (ref 1500–7800)
Neutrophils Relative %: 81.6 %
Platelets: 310 10*3/uL (ref 140–400)
RBC: 4.27 10*6/uL (ref 3.80–5.10)
RDW: 12.8 % (ref 11.0–15.0)
Total Lymphocyte: 11.1 %
WBC: 5.7 10*3/uL (ref 3.8–10.8)

## 2020-02-20 LAB — VITAMIN D 25 HYDROXY (VIT D DEFICIENCY, FRACTURES): Vit D, 25-Hydroxy: 55 ng/mL (ref 30–100)

## 2020-02-20 LAB — T4, FREE: Free T4: 1.5 ng/dL (ref 0.8–1.8)

## 2020-02-20 LAB — TSH: TSH: 3.47 mIU/L (ref 0.40–4.50)

## 2020-02-20 LAB — T3, FREE: T3, Free: 4.2 pg/mL (ref 2.3–4.2)

## 2020-02-20 MED ORDER — CIPROFLOXACIN HCL 500 MG PO TABS
500.0000 mg | ORAL_TABLET | Freq: Two times a day (BID) | ORAL | 0 refills | Status: AC
Start: 2020-02-20 — End: 2020-02-25

## 2020-02-20 NOTE — Telephone Encounter (Signed)
Patient stated she left a urine and had blood drawn yesterday by from what she described as possibly being [place].  Message sent to PCP.

## 2020-02-20 NOTE — Telephone Encounter (Signed)
Results have been received/reviewed.

## 2020-02-20 NOTE — Telephone Encounter (Signed)
-----   Message from [name], MD sent at 02/19/2020  5:09 PM EST ----- She was supposed to complete bloodwork on her way out yesterday but I don't see results. Can you please look into this? Help set up lab visit if needed. She doesn't need to be fasting. Thanks!

## 2020-02-20 NOTE — Progress Notes (Signed)
I called patient with results.  She is feeling a little bit better today.  She says she did have some nasal congestion and feels she may be starting and on some sinus or respiratory infection.  She is not having any increased urinary symptoms.  She is not had any fevers or chills, no nausea or vomiting, no abdominal pain.  We discussed that UA does appear positive for urinary tract infection.  Culture still pending at this point.  Based on her previous urine cultures and recent intolerance to Bactrim, I am going to give her Cipro to take for this current urinary tract infection.  I encouraged her to stay well-hydrated and rest to let her body recover from this infection.  We discussed elevated D-dimer.  I am uncertain if this is purely related to infection that she is dealing with at this time.  We did discuss that this could indicate a blood clot.  She has had some elevated heart rate, but did have a normal EKG done in the office.  She does feel like her heart is beating faster when she is active and she feels more tired, but I am uncertain if this is just related to her untreated urinary tract infection.  We discussed that if she has any worsening of breathing, shortness of breath, increased heart rate, or not having improved heart rate with rest that she needs to go to the emergency room for further evaluation.  I will check back in with her once I get her urine culture result back.  We also reviewed her elevated liver enzymes.  She is not having any abdominal symptoms at this time.  We discussed that these can fluctuate, especially when there is infection in the body.  We will plan to recheck these next week.  Advised her that if she has any questions over the holiday weekend she can call our main office to get to the on-call provider.

## 2020-02-22 LAB — URINE CULTURE
MICRO NUMBER:: 11351265
SPECIMEN QUALITY:: ADEQUATE

## 2020-02-22 LAB — URINALYSIS W MICROSCOPIC + REFLEX CULTURE
Bilirubin Urine: NEGATIVE
Glucose, UA: NEGATIVE
Hyaline Cast: NONE SEEN /LPF
Nitrites, Initial: POSITIVE — AB
Specific Gravity, Urine: 1.018 (ref 1.001–1.03)
WBC, UA: >=60 /HPF — AB (ref 0–5)
pH: 6 (ref 5.0–8.0)

## 2020-02-22 LAB — CULTURE INDICATED

## 2020-02-24 ENCOUNTER — Other Ambulatory Visit: Payer: Self-pay | Admitting: Family Medicine

## 2020-02-24 ENCOUNTER — Telehealth: Payer: Self-pay | Admitting: Family Medicine

## 2020-02-24 ENCOUNTER — Encounter: Payer: Self-pay | Admitting: Obstetrics & Gynecology

## 2020-02-24 DIAGNOSIS — R748 Abnormal levels of other serum enzymes: Secondary | ICD-10-CM

## 2020-02-24 NOTE — Telephone Encounter (Signed)
Patient is returning the call, please advise. CB is [phone]

## 2020-02-24 NOTE — Telephone Encounter (Signed)
See results note. 

## 2020-02-25 ENCOUNTER — Telehealth: Payer: Self-pay | Admitting: Family Medicine

## 2020-02-25 ENCOUNTER — Other Ambulatory Visit: Payer: Self-pay

## 2020-02-25 ENCOUNTER — Ambulatory Visit: Payer: Medicare PPO | Admitting: Family Medicine

## 2020-02-25 ENCOUNTER — Other Ambulatory Visit (INDEPENDENT_AMBULATORY_CARE_PROVIDER_SITE_OTHER): Payer: Medicare PPO

## 2020-02-25 ENCOUNTER — Encounter: Payer: Self-pay | Admitting: Family Medicine

## 2020-02-25 VITALS — BP 122/86 | HR 88 | Temp 98.8°F | Wt 208.0 lb

## 2020-02-25 DIAGNOSIS — R0602 Shortness of breath: Secondary | ICD-10-CM

## 2020-02-25 DIAGNOSIS — R079 Chest pain, unspecified: Secondary | ICD-10-CM | POA: Diagnosis not present

## 2020-02-25 DIAGNOSIS — R748 Abnormal levels of other serum enzymes: Secondary | ICD-10-CM | POA: Diagnosis not present

## 2020-02-25 LAB — HEPATIC FUNCTION PANEL
ALT: 58 U/L — ABNORMAL HIGH (ref 0–35)
AST: 60 U/L — ABNORMAL HIGH (ref 0–37)
Albumin: 3.9 g/dL (ref 3.5–5.2)
Alkaline Phosphatase: 182 U/L — ABNORMAL HIGH (ref 39–117)
Bilirubin, Direct: 0.2 mg/dL (ref 0.0–0.3)
Total Bilirubin: 0.8 mg/dL (ref 0.2–1.2)
Total Protein: 6.6 g/dL (ref 6.0–8.3)

## 2020-02-25 NOTE — Progress Notes (Signed)
   Subjective:    Patient ID: Brooke [name], female    DOB: 1950/11/13, 69 y.o.   MRN: [id number]  HPI Here with concerns about chest pains, weakness, feelings of her heart pounding, and SOB. This started about 2 weeks ago. She has never felt this way before. These symptoms come and go, and they are not related to exertion. She describes feeling chills at times, but no sweats or fever. No coughing, no NVD. She saw Dr. Seymour Bars on 02-12-20 about her hx of breast cancer, and she advised her (with Oncology's approval) to stop taking Tamoxifen after being on it a little over one year. She then saw some darkening of her urine, and she saw Dr. Hassan Rowan on 02-19-20. Her UA was strongly positive, so she was given a 5 day course of Cipro. The urine culture grew Proteus mirabilis which is sensitive to Cipro, so she will finish this out. She had extensive labs drawn that day which were remarkable for an elevated d dimer to 2.90 and mildly elevated transaminases. An EKG that day was normal. She denies any recent pain or swelling in the feet or legs.    Review of Systems  Constitutional: Positive for fatigue.  Respiratory: Positive for chest tightness and shortness of breath. Negative for cough, choking, wheezing and stridor.   Cardiovascular: Positive for chest pain. Negative for palpitations and leg swelling.  Gastrointestinal: Negative.   Genitourinary: Negative.   Neurological: Negative.        Objective:   Physical Exam Constitutional:      Appearance: Normal appearance.     Comments: She appears comfortable at rest but she gets mildly SOB when walking done the hall   Cardiovascular:     Rate and Rhythm: Normal rate and regular rhythm.     Pulses: Normal pulses.     Heart sounds: Normal heart sounds.  Pulmonary:     Effort: Pulmonary effort is normal.     Breath sounds: Normal breath sounds.     Comments: Mildly  tender in the parasternal areas  Lymphadenopathy:     Cervical: No cervical  adenopathy.  Neurological:     General: No focal deficit present.     Mental Status: She is alert and oriented to person, place, and time.  Psychiatric:        Mood and Affect: Mood normal.           Assessment & Plan:  She has chest pains and SOB with an elevated d dimer, so a PE is a possible etiology. We will set up a chest CT angiogram asap. She will let us know if anything else changes.  Gershon Crane, MD

## 2020-02-25 NOTE — Telephone Encounter (Signed)
Pt is calling in stating that her referral went to a different GI office and she does not want to go and want it to be sent to [facility] to see Dr. Charna Elizabeth  [address] [place]  [phone] (F).  I spoke with Gavin Pound the referral coordinator and she is going to send it to Dr. Kenna Gilbert office so that the pt can be seen.

## 2020-02-25 NOTE — Addendum Note (Signed)
Addended by: Leonette Nutting on: 02/25/2020 09:37 AM   Modules accepted: Orders

## 2020-02-27 ENCOUNTER — Telehealth: Payer: Self-pay | Admitting: Family Medicine

## 2020-02-27 ENCOUNTER — Other Ambulatory Visit: Payer: Self-pay

## 2020-02-27 ENCOUNTER — Ambulatory Visit
Admission: RE | Admit: 2020-02-27 | Discharge: 2020-02-27 | Disposition: A | Discharge status: Home / Self Care | Payer: Medicare PPO | Source: Ambulatory Visit | Attending: Family Medicine | Admitting: Family Medicine

## 2020-02-27 DIAGNOSIS — L929 Granulomatous disorder of the skin and subcutaneous tissue, unspecified: Secondary | ICD-10-CM | POA: Diagnosis not present

## 2020-02-27 DIAGNOSIS — R0602 Shortness of breath: Secondary | ICD-10-CM | POA: Diagnosis not present

## 2020-02-27 DIAGNOSIS — Z853 Personal history of malignant neoplasm of breast: Secondary | ICD-10-CM | POA: Diagnosis not present

## 2020-02-27 DIAGNOSIS — R079 Chest pain, unspecified: Secondary | ICD-10-CM

## 2020-02-27 DIAGNOSIS — K753 Granulomatous hepatitis, not elsewhere classified: Secondary | ICD-10-CM | POA: Diagnosis not present

## 2020-02-27 MED ORDER — IOPAMIDOL (ISOVUE-370) INJECTION 76%
75.0000 mL | Freq: Once | INTRAVENOUS | Status: AC | PRN
  Administered 2020-02-27: 75 mL via INTRAVENOUS

## 2020-02-27 NOTE — Telephone Encounter (Signed)
I called patient to discuss CT results that were resulted. We reviewed that there were multiple areas on CT that caused concern for cancer.  Patient states that she has just been feeling wiped out.  She does state that urine is still somewhat darker than normal, although she is not noticing any gross blood in the urine.  Her appetite is poor.  She does seem to have some burping with eating and feels hungry although she does not want to eat.  She is doing a good job keeping up with fluids.  She does feel that her heart rate (which had felt somewhat elevated at her last office visit) has returned to normal.  She does have some nasal congestion intermittently.  I was able to reach out to oncology Marga Hoots) and they will reach out to patient on 1/3 to get her seen sooner than scheduled appointment on 1/12 with Dr. Darnelle Catalan.  In the meanwhile, she does not feel she needs anything additional (ie we discussed medication for nausea since she has some of this). I have encouraged her to reach out to [place] if she has concerns.   We discussed a few changes from her normal, which I feel will be addressed in followup with oncology - she still has darker urine; I feel that follow up UA is warranted. She is not having any urinary symptoms. She has also had some skin lesions that have come up in last 6 months which was a change from baseline. She has not yet set up appointment with GI (Dr. Loreta Ave) but has been referred. I told her she could wait until follow up with oncology next week before calling Dr. Kenna Gilbert office to schedule anything.

## 2020-03-02 ENCOUNTER — Ambulatory Visit: Payer: Medicare PPO | Admitting: Oncology

## 2020-03-02 ENCOUNTER — Other Ambulatory Visit: Payer: Medicare PPO

## 2020-03-03 ENCOUNTER — Other Ambulatory Visit: Payer: Self-pay | Admitting: Oncology

## 2020-03-03 DIAGNOSIS — M899 Disorder of bone, unspecified: Secondary | ICD-10-CM

## 2020-03-03 DIAGNOSIS — K769 Liver disease, unspecified: Secondary | ICD-10-CM

## 2020-03-03 DIAGNOSIS — R918 Other nonspecific abnormal finding of lung field: Secondary | ICD-10-CM

## 2020-03-03 DIAGNOSIS — Z170 Estrogen receptor positive status [ER+]: Secondary | ICD-10-CM

## 2020-03-03 DIAGNOSIS — C50812 Malignant neoplasm of overlapping sites of left female breast: Secondary | ICD-10-CM

## 2020-03-03 NOTE — Progress Notes (Unsigned)
I called Brooke [name] to discuss the results of her CT angio which showed no clot but did show multiple rather puzzling findings.  To me the most concerning for cancer recurrence or other bone findings.  The very diffuse findings in the lungs and perhaps the liver might turn out to be something else entirely although of course we cannot be sure until we biopsy.  I think a PET scan is in order I have discussed that with her and she is very eager to have it done.  She will see me 03/11/2020 and hopefully we will have those results by then.

## 2020-03-05 ENCOUNTER — Other Ambulatory Visit: Payer: Self-pay | Admitting: Oncology

## 2020-03-05 ENCOUNTER — Other Ambulatory Visit: Payer: Self-pay

## 2020-03-05 ENCOUNTER — Ambulatory Visit (HOSPITAL_COMMUNITY)
Admission: RE | Admit: 2020-03-05 | Discharge: 2020-03-05 | Disposition: A | Discharge status: Home / Self Care | Payer: Medicare PPO | Source: Ambulatory Visit | Attending: Oncology | Admitting: Oncology

## 2020-03-05 DIAGNOSIS — Z170 Estrogen receptor positive status [ER+]: Secondary | ICD-10-CM | POA: Diagnosis not present

## 2020-03-05 DIAGNOSIS — K769 Liver disease, unspecified: Secondary | ICD-10-CM | POA: Diagnosis not present

## 2020-03-05 DIAGNOSIS — N200 Calculus of kidney: Secondary | ICD-10-CM | POA: Diagnosis not present

## 2020-03-05 DIAGNOSIS — M899 Disorder of bone, unspecified: Secondary | ICD-10-CM | POA: Diagnosis not present

## 2020-03-05 DIAGNOSIS — C50812 Malignant neoplasm of overlapping sites of left female breast: Secondary | ICD-10-CM | POA: Diagnosis not present

## 2020-03-05 DIAGNOSIS — C7800 Secondary malignant neoplasm of unspecified lung: Secondary | ICD-10-CM

## 2020-03-05 DIAGNOSIS — C787 Secondary malignant neoplasm of liver and intrahepatic bile duct: Secondary | ICD-10-CM | POA: Insufficient documentation

## 2020-03-05 DIAGNOSIS — R160 Hepatomegaly, not elsewhere classified: Secondary | ICD-10-CM | POA: Insufficient documentation

## 2020-03-05 DIAGNOSIS — R918 Other nonspecific abnormal finding of lung field: Secondary | ICD-10-CM | POA: Diagnosis not present

## 2020-03-05 DIAGNOSIS — C7951 Secondary malignant neoplasm of bone: Secondary | ICD-10-CM | POA: Diagnosis not present

## 2020-03-05 DIAGNOSIS — K7689 Other specified diseases of liver: Secondary | ICD-10-CM | POA: Diagnosis not present

## 2020-03-05 DIAGNOSIS — C50919 Malignant neoplasm of unspecified site of unspecified female breast: Secondary | ICD-10-CM | POA: Diagnosis not present

## 2020-03-05 LAB — GLUCOSE, CAPILLARY: Glucose-Capillary: 146 mg/dL — ABNORMAL HIGH (ref 70–99)

## 2020-03-05 MED ORDER — FLUDEOXYGLUCOSE F - 18 (FDG) INJECTION
10.4600 | Freq: Once | INTRAVENOUS | Status: AC
  Administered 2020-03-05: 10.46 via INTRAVENOUS

## 2020-03-05 NOTE — Progress Notes (Signed)
I was unable to reach [facility] with the results of her PET scan but I gave them to her by voicemail.  I also set her up for lab work tomorrow so we can get a CA 19 and CA 27-29 and give [place] a hint as to what we are dealing with.  I have also tried to set her up for a liver biopsy.  She is scheduled to see me January 12 to discuss further.

## 2020-03-06 ENCOUNTER — Inpatient Hospital Stay: Payer: Medicare PPO | Attending: Oncology

## 2020-03-06 ENCOUNTER — Telehealth (HOSPITAL_COMMUNITY): Payer: Self-pay

## 2020-03-06 ENCOUNTER — Other Ambulatory Visit: Payer: Self-pay

## 2020-03-06 ENCOUNTER — Encounter (HOSPITAL_COMMUNITY): Payer: Self-pay

## 2020-03-06 DIAGNOSIS — D0361 Melanoma in situ of right upper limb, including shoulder: Secondary | ICD-10-CM

## 2020-03-06 DIAGNOSIS — C50812 Malignant neoplasm of overlapping sites of left female breast: Secondary | ICD-10-CM

## 2020-03-06 DIAGNOSIS — Z923 Personal history of irradiation: Secondary | ICD-10-CM | POA: Diagnosis not present

## 2020-03-06 DIAGNOSIS — I10 Essential (primary) hypertension: Secondary | ICD-10-CM | POA: Diagnosis not present

## 2020-03-06 DIAGNOSIS — C7800 Secondary malignant neoplasm of unspecified lung: Secondary | ICD-10-CM | POA: Diagnosis not present

## 2020-03-06 DIAGNOSIS — Z170 Estrogen receptor positive status [ER+]: Secondary | ICD-10-CM | POA: Diagnosis not present

## 2020-03-06 DIAGNOSIS — Z8249 Family history of ischemic heart disease and other diseases of the circulatory system: Secondary | ICD-10-CM | POA: Diagnosis not present

## 2020-03-06 DIAGNOSIS — Z9221 Personal history of antineoplastic chemotherapy: Secondary | ICD-10-CM | POA: Diagnosis not present

## 2020-03-06 DIAGNOSIS — Z803 Family history of malignant neoplasm of breast: Secondary | ICD-10-CM | POA: Insufficient documentation

## 2020-03-06 DIAGNOSIS — Z8042 Family history of malignant neoplasm of prostate: Secondary | ICD-10-CM | POA: Diagnosis not present

## 2020-03-06 DIAGNOSIS — Z800 Family history of malignant neoplasm of digestive organs: Secondary | ICD-10-CM | POA: Insufficient documentation

## 2020-03-06 DIAGNOSIS — Z8049 Family history of malignant neoplasm of other genital organs: Secondary | ICD-10-CM | POA: Diagnosis not present

## 2020-03-06 DIAGNOSIS — E559 Vitamin D deficiency, unspecified: Secondary | ICD-10-CM | POA: Insufficient documentation

## 2020-03-06 DIAGNOSIS — Z9012 Acquired absence of left breast and nipple: Secondary | ICD-10-CM | POA: Insufficient documentation

## 2020-03-06 DIAGNOSIS — C50912 Malignant neoplasm of unspecified site of left female breast: Secondary | ICD-10-CM | POA: Insufficient documentation

## 2020-03-06 DIAGNOSIS — C7951 Secondary malignant neoplasm of bone: Secondary | ICD-10-CM | POA: Insufficient documentation

## 2020-03-06 DIAGNOSIS — C787 Secondary malignant neoplasm of liver and intrahepatic bile duct: Secondary | ICD-10-CM | POA: Diagnosis not present

## 2020-03-06 DIAGNOSIS — M8580 Other specified disorders of bone density and structure, unspecified site: Secondary | ICD-10-CM | POA: Insufficient documentation

## 2020-03-06 DIAGNOSIS — Z833 Family history of diabetes mellitus: Secondary | ICD-10-CM | POA: Insufficient documentation

## 2020-03-06 DIAGNOSIS — Z8261 Family history of arthritis: Secondary | ICD-10-CM | POA: Diagnosis not present

## 2020-03-06 DIAGNOSIS — Z79899 Other long term (current) drug therapy: Secondary | ICD-10-CM | POA: Insufficient documentation

## 2020-03-06 DIAGNOSIS — Z5111 Encounter for antineoplastic chemotherapy: Secondary | ICD-10-CM | POA: Insufficient documentation

## 2020-03-06 DIAGNOSIS — R160 Hepatomegaly, not elsewhere classified: Secondary | ICD-10-CM

## 2020-03-06 LAB — CBC WITH DIFFERENTIAL/PLATELET
Abs Immature Granulocytes: 0.07 10*3/uL (ref 0.00–0.07)
Basophils Absolute: 0.1 10*3/uL (ref 0.0–0.1)
Basophils Relative: 1 %
Eosinophils Absolute: 0.1 10*3/uL (ref 0.0–0.5)
Eosinophils Relative: 1 %
HCT: 36.3 % (ref 36.0–46.0)
Hemoglobin: 12.3 g/dL (ref 12.0–15.0)
Immature Granulocytes: 1 %
Lymphocytes Relative: 14 %
Lymphs Abs: 0.8 10*3/uL (ref 0.7–4.0)
MCH: 28.5 pg (ref 26.0–34.0)
MCHC: 33.9 g/dL (ref 30.0–36.0)
MCV: 84 fL (ref 80.0–100.0)
Monocytes Absolute: 0.5 10*3/uL (ref 0.1–1.0)
Monocytes Relative: 9 %
Neutro Abs: 4.3 10*3/uL (ref 1.7–7.7)
Neutrophils Relative %: 74 %
Platelets: 290 10*3/uL (ref 150–400)
RBC: 4.32 MIL/uL (ref 3.87–5.11)
RDW: 13.7 % (ref 11.5–15.5)
WBC: 5.9 10*3/uL (ref 4.0–10.5)
nRBC: 0 % (ref 0.0–0.2)

## 2020-03-06 LAB — CEA (IN HOUSE-CHCC): CEA (CHCC-In House): 744.17 ng/mL — ABNORMAL HIGH (ref 0.00–5.00)

## 2020-03-06 LAB — COMPREHENSIVE METABOLIC PANEL
ALT: 85 U/L — ABNORMAL HIGH (ref 0–44)
AST: 113 U/L — ABNORMAL HIGH (ref 15–41)
Albumin: 3.4 g/dL — ABNORMAL LOW (ref 3.5–5.0)
Alkaline Phosphatase: 211 U/L — ABNORMAL HIGH (ref 38–126)
Anion gap: 13 (ref 5–15)
BUN: 10 mg/dL (ref 8–23)
CO2: 20 mmol/L — ABNORMAL LOW (ref 22–32)
Calcium: 9.5 mg/dL (ref 8.9–10.3)
Chloride: 101 mmol/L (ref 98–111)
Creatinine, Ser: 0.92 mg/dL (ref 0.44–1.00)
GFR, Estimated: >60 mL/min (ref >60)
Glucose, Bld: 135 mg/dL — ABNORMAL HIGH (ref 70–99)
Potassium: 3.3 mmol/L — ABNORMAL LOW (ref 3.5–5.1)
Sodium: 134 mmol/L — ABNORMAL LOW (ref 135–145)
Total Bilirubin: 1 mg/dL (ref 0.3–1.2)
Total Protein: 6.8 g/dL (ref 6.5–8.1)

## 2020-03-06 NOTE — Progress Notes (Signed)
Brooke [name] Female, 71 y.o., 04/03/1950  MRN:  [id number] Phone:  [phone] [name])       PCP:  [name], MD Coverage:  [facility] Medicare/[employer] Medicare Choice Ppo  Next Appt With Radiology ([facility]-MR 1) 03/10/2020 at 5:00 PM           RE: Biopsy Received: Today  Message Details  Corrie Mckusick, DO  [facility] [place] for US guided liver mass biopsy.   DDx is breast vs pancreatic.   [name]    Previous Messages  ----- Message -----  From: [name]  Sent: 03/06/2020  7:53 AM EST  To: Ir Procedure Requests  Subject: Biopsy                      Procedure Requested: [place] Core Biopsy (Liver)    Reason for Procedure: confirm metastases    Provider Requesting: Chauncey Cruel  Provider Telephone: [phone]    Other Info:

## 2020-03-06 NOTE — Progress Notes (Unsigned)
MM     3       Brooke [name] Female, 70 y.o., March 24, 1950  MRN:  [id number] Phone:  [phone] [name])       PCP:  [name], MD Coverage:  [facility] Medicare/[employer] Medicare Choice Ppo  Next Appt With Radiology ([facility]-MR 1) 03/10/2020 at 5:00 PM           RE: Biopsy Received: Today  Message Details  Greggory Keen, MD  [name] for US liver lesion core bx   TS    Previous Messages  ----- Message -----  From: [name]  Sent: 03/06/2020  7:53 AM EST  To: Ir Procedure Requests  Subject: Biopsy                      Procedure Requested: [place] Core Biopsy (Liver)    Reason for Procedure: confirm metastases    Provider Requesting: Chauncey Cruel  Provider Telephone: [phone]    Other Info:

## 2020-03-07 LAB — CANCER ANTIGEN 19-9: CA 19-9: 32 U/mL (ref 0–35)

## 2020-03-07 LAB — CANCER ANTIGEN 27.29: CA 27.29: 983 U/mL — ABNORMAL HIGH (ref 0.0–38.6)

## 2020-03-09 DIAGNOSIS — Z170 Estrogen receptor positive status [ER+]: Secondary | ICD-10-CM | POA: Diagnosis not present

## 2020-03-09 DIAGNOSIS — C50212 Malignant neoplasm of upper-inner quadrant of left female breast: Secondary | ICD-10-CM | POA: Diagnosis not present

## 2020-03-10 ENCOUNTER — Ambulatory Visit (HOSPITAL_COMMUNITY): Payer: Medicare PPO

## 2020-03-10 NOTE — Progress Notes (Signed)
[place]  Telephone:([phone]) [phone] Fax:(336) [phone]    ID: Brooke [name] DOB: 24-Nov-1950  MR#: [id number]  HQI#:[id number]  Patient Care Team: [name], MD as PCP - General (Family Medicine) Rolm Bookbinder, MD as Attending Physician (Dermatology) Eiko Mcgowen, Virgie Dad, MD as Consulting Physician (Oncology) Kyung Rudd, MD as Consulting Physician (Radiation Oncology) Jovita Kussmaul, MD as Consulting Physician (General Surgery) Irene Limbo, MD as Consulting Physician (Plastic Surgery) Causey, Charlestine Massed, NP as Nurse Practitioner (Hematology and Oncology) [name], MD as Consulting Physician (Obstetrics and Gynecology) OTHER MD:   CHIEF COMPLAINT: Estrogen receptor positive breast cancer  CURRENT TREATMENT: Metastatic work-up in process   INTERVAL HISTORY: Brooke [name] returns today for follow-up of her estrogen receptor positive breast cancer.  She now likely has widely disseminated breast cancer.  This is being worked up.  She presented to Dr. Dellis Filbert with the hematuria.  She was worked up with an ultrasound and  endometrial biopsy performed on 02/13/2020.  This was benign.  She was treated with antibiotics and the hematuria has resolved.  She saw Dr. Heide Spark for what [facility] describes as general malaise and stomach upset.  They obtained lab work EKG, and did a good exam without uncovering anything unusual.  The patient then returned with worsening respiratory symptoms and saw saw Dr. Sarajane Jews as Dr. Jackie Plum was not available.  He obtained a D-dimer which was elevated.  He s set her up for an angio chest CT on 02/27/2020 showing: no acute cardiopulmonary disease, specifically no evidence of pulmonary embolism; multiple punctate bilateral pulmonary nodules, mediastinal and right hilar adenopathy, and suspected osseous and subcutaneous metastatic disease; abnormal mottled enhancement of liver; indeterminate 1.6 cm left-sided adrenal nodule.  She proceeded  to PET scan on 03/05/2020 showing: widespread metastatic disease to chest, abdomen, and pelvis; metastatic disease from breast cancer considered, though with pancreatic mass showing peripheral pancreatic atrophy, widespread disease from pancreatic adenocarcinoma is another differential consideration; chest and neck involvement with lymph nodes and pulmonary nodules; solid organ involvement in abdomen includes liver, pancreas, and bilateral adrenal glands with signs of diffuse peritoneal and retroperitoneal nodularity and retroperitoneal nodal involvement; musculoskeletal involvement includes widespread bony metastatic disease involving all visualized bones and potentially atlantooccipital joint in addition to entire spine; heterogeneity of visualized brain; numerous subcutaneous nodules; nonspecific areas of uptake within right parotid and within a leiomyoma in uterus.   Since her last visit, she also underwent bone density screening on 02/05/2020 showing a T-score of -1.0, which is considered normal.  She is scheduled for liver biopsy on 03/13/2020 and for brain MRI on 03/18/2020.   REVIEW OF SYSTEMS: Brooke [name] was very short of breath when she came to see [place].  We obtained a pulse ox on room air which was 87%.  We started her on O2 at 1 L/min during the interview.  That brought her sats to about 94-95%.  She denies cough or pleurisy.  There have been no fevers or phlegm production.  She is having some headaches today.  She has had no visual changes, no nausea or vomiting, no gait imbalance and no falls.  She has noted some bumps in the nape of her neck and also on her scalp.     COVID 19 VACCINATION STATUS: fully vaccinated [employer]), with booster 12/28/2019    HISTORY OF CURRENT ILLNESS: From the original intake note:  Brooke [name] herself palpated a change in her left breast and brought this to medical attention.  On 01/18/2017 she  underwent bilateral diagnostic mammography with tomography and bilateral breast  ultrasonography at [facility].  The breast density was category B.  At the most recent exam was from December 2016.  In the right breast upper outer quadrant there was a 1 cm irregular mass associated with punctate calcification.  By ultrasound this was not well-defined, and it was biopsied on 01/26/2017 with tomographic guidance this showed a complex sclerosing lesion with the usual ductal hyperplasia.  The right axilla was sonographically  On the left mammography showed a 4 cm dense mass with indistinct margins in the upper inner quadrant.  Ultrasound defined a 3 cm irregular mass in the upper inner quadrant of the left breast which was palpable.  There was an abnormal lymph node in the left axilla.  Biopsy of the left mass and abnormal lymph node ([id number], on 01/26/2017) showed both to be involved by invasive ductal carcinoma, with some lobular features but E-cadherin positive, estrogen and progesterone receptors both 100% positive, with strong staining intensity, with MIB-1 between 15-20%.  There was no HER-2 amplification, the signals ratio being 1.31/1.58 and the number per cell 2.30/2.05  The patient's subsequent history is as detailed below.   PAST MEDICAL HISTORY: Past Medical History:  Diagnosis Date  . Anemia   . Anxiety   . Arthritis   . ASCUS of cervix with negative high risk HPV 12/2018  . Bilateral dry eyes   . Breast cancer, left breast ([place])   . Family history of breast cancer 02/02/2017  . Family history of breast cancer   . Family history of colon cancer 02/02/2017  . Family history of colon cancer   . Family history of prostate cancer   . Family history of prostate cancer in father 02/02/2017  . Family history of uterine cancer   . Fibroid   . History of kidney stones   . Hyperlipidemia   . Hypertension   . Melanoma of upper arm ([place]) 2009   RIGHT ARM   . Obesity (BMI 30.0-34.9)   . Osteopenia 09/2017   T score -1.9 max 8% / 0.8% statistically significant decline at  right and left hip stable at the spine  . PONV (postoperative nausea and vomiting)   . Squamous carcinoma     of the skin  . UTI (urinary tract infection)   . Varicose veins of both lower extremities   . Vitamin D deficiency 07/2009   LOW VITAMIN D 29  . Vitamin deficiency 07/2008   VITAMIN D LOW 23    PAST SURGICAL HISTORY: Past Surgical History:  Procedure Laterality Date  . ABDOMINAL SURGERY  1975   ovairan cyst   . APPENDECTOMY    . BREAST LUMPECTOMY WITH RADIOACTIVE SEED LOCALIZATION Right 09/25/2017   Procedure: RIGHT BREAST LUMPECTOMY WITH RADIOACTIVE SEED LOCALIZATION;  Surgeon: Jovita Kussmaul, MD;  Location: [place];  Service: General;  Laterality: Right;  . BREAST SURGERY     bio  . CHOLECYSTECTOMY    . DERMOID CYST  EXCISION    . DILATATION & CURETTAGE/HYSTEROSCOPY WITH MYOSURE N/A 04/23/2019   Procedure: DILATATION & CURETTAGE/HYSTEROSCOPY WITH MYOSURE;  Surgeon: [name], MD;  Location: [place];  Service: Gynecology;  Laterality: N/A;  request to follow at 9:30am in [facility] block requests 45 minutes  . DILATION AND CURETTAGE OF UTERUS  1998  . GALLBLADDER SURGERY    . HYSTEROSCOPY    . insertion  port a cath  03/02/2016  . MASTECTOMY MODIFIED RADICAL Left  09/25/2017   Procedure: LEFT MODIFIED RADICAL MASTECTOMY;  Surgeon: Jovita Kussmaul, MD;  Location: [place];  Service: General;  Laterality: Left;  . PORTACATH PLACEMENT N/A 03/02/2017   Procedure: INSERTION PORT-A-CATH;  Surgeon: Jovita Kussmaul, MD;  Location: [place];  Service: General;  Laterality: N/A;  . SKIN BIOPSY    . SKIN CANCER EXCISION  2010,2011   2010-RIGHT ARM, 2011-LEFT LOWER LEG.-DR Rolm Bookbinder DERM. DR. Sarajane Jews IS HER DERM SURGEON.    FAMILY HISTORY Family History  Problem Relation Age of Onset  . Diabetes Father   . Hypertension Father   . Heart disease Father   . Prostate cancer Father 68  . Arthritis Father   . Breast cancer Sister 33       again at age 78, GT  results unk  . Skin cancer Sister   . Colon cancer Paternal Grandfather 90  . Hypertension Mother   . Heart failure Mother   . Uterine cancer Sister 38       stage IV  . Breast cancer Cousin        mat first cousin dx under 56  The patient's father was diagnosed with prostate cancer in his 70s and died at age 77 from complications of that disease.  The patient's mother died at age 54 with congestive heart failure.  The patient had no brothers, 3 sisters.  One sister had breast cancer at age 38 and again at age 46.  She has been genetically tested and was found to be BRCA not mutated in addition a paternal grandfather had colon cancer in his 81s.  There is no history of ovarian cancer in the family to the patient's knowledge.   GYNECOLOGIC HISTORY:  Patient's last menstrual period was 03/18/1997. Menarche age 37. The patient is GX P0.  She stopped having periods in 1999 and took hormone replacement approximately 5 years.   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  She is now retired.  She is single and lives alone with no pets.    ADVANCED DIRECTIVES: Not in place   HEALTH MAINTENANCE: Social History   Tobacco Use  . Smoking status: Never Smoker  . Smokeless tobacco: Never Used  Vaping Use  . Vaping Use: Never used  Substance Use Topics  . Alcohol use: No  . Drug use: No    Colonoscopy: Never  PAP: 12/2019, negative  Bone density: 2017/osteopenia   Allergies  Allergen Reactions  . Neosporin [Neomycin-Bacitracin Zn-Polymyx] Swelling, Rash and Other (See Comments)    Blisters  . Bactrim [Sulfamethoxazole-Trimethoprim]     Made her feel very sick  . Benzalkonium Chloride Other (See Comments)    Redness (Pt is unaware)  It works by killing microorganisms and inhibiting their future growth, and for this reason frequently appears as an ingredient in antibacterial  hand wipes, antiseptic creams and anti-itch ointments.  . Latex Rash    Current Outpatient Medications  Medication Sig Dispense Refill  . dexamethasone (DECADRON) 4 MG tablet Take 1 tablet (4 mg total) by mouth daily. 60 tablet 1  . losartan (COZAAR) 25 MG tablet TAKE 1 TABLET(25 MG) BY MOUTH DAILY (Patient taking differently: Taking 1/2 tablet) 90 tablet 1  . Propylene Glycol 0.6 % SOLN Place 1 drop into both eyes daily.    . vitamin C (ASCORBIC ACID) 500 MG tablet Take 1,000 mg by mouth daily.     Marland Kitchen  vitamin E 600 UNIT capsule Take 400 Units by mouth 2 (two) times a week.      No current facility-administered medications for this visit.    OBJECTIVE:  white woman wearing O2 by nasal cannula Vitals:   03/11/20 1419  BP: (!) 145/72  Pulse: (!) 120  Resp: 18  Temp: (!) 97.5 F (36.4 C)  SpO2: (!) 88%     Body mass index is 30.83 kg/m.   Wt Readings from Last 3 Encounters:  03/11/20 201 lb 4.8 oz (91.3 kg)  02/25/20 208 lb (94.3 kg)  02/19/20 212 lb 11.2 oz (96.5 kg)  ECOG FS:1 - Symptomatic but completely ambulatory  Sclerae unicteric, EOMs intact Wearing a mask No cervical or supraclavicular adenopathy Lungs no rales or rhonchi Heart regular rate and rhythm Abd soft, nontender, positive bowel sounds MSK no focal spinal tenderness, no upper extremity lymphedema Neuro: nonfocal, well oriented, appropriate affect Breasts: The right breast is status post lumpectomy.  The left breast is status post mastectomy. Skin: There is a 1-1/2 cm firm subcutaneous nodule at the nape of the neck.  There are 2 smaller nodules in the's right parietal area of the scalp.  LAB RESULTS:  CMP     Component Value Date/Time   NA 134 (L) 03/06/2020 0922   NA 141 03/03/2017 1358   K 3.3 (L) 03/06/2020 0922   K 3.3 (L) 03/03/2017 1358   CL 101 03/06/2020 0922   CO2 20 (L) 03/06/2020 0922   CO2 28 03/03/2017 1358   GLUCOSE 135 (H) 03/06/2020 0922   GLUCOSE 107 03/03/2017 1358   BUN 10  03/06/2020 0922   BUN 12.7 03/03/2017 1358   CREATININE 0.92 03/06/2020 0922   CREATININE 0.83 02/19/2020 0936   CREATININE 0.9 03/03/2017 1358   CALCIUM 9.5 03/06/2020 0922   CALCIUM 9.4 03/03/2017 1358   PROT 6.8 03/06/2020 0922   PROT 6.5 03/03/2017 1358   ALBUMIN 3.4 (L) 03/06/2020 0922   ALBUMIN 3.7 03/03/2017 1358   AST 113 (H) 03/06/2020 0922   AST 43 (H) 09/12/2018 1354   AST 23 03/03/2017 1358   ALT 85 (H) 03/06/2020 0922   ALT 84 (H) 09/12/2018 1354   ALT 25 03/03/2017 1358   ALKPHOS 211 (H) 03/06/2020 0922   ALKPHOS 71 03/03/2017 1358   BILITOT 1.0 03/06/2020 0922   BILITOT 0.6 09/12/2018 1354   BILITOT 0.52 03/03/2017 1358   GFRNONAA >60 03/06/2020 0922   GFRNONAA >60 09/12/2018 1354   GFRAA >60 06/12/2019 1401   GFRAA >60 09/12/2018 1354    No results found for: TOTALPROTELP, ALBUMINELP, A1GS, A2GS, BETS, BETA2SER, GAMS, MSPIKE, SPEI  No results found for: Nils Pyle, [facility]  Lab Results  Component Value Date   WBC 5.9 03/06/2020   NEUTROABS 4.3 03/06/2020   HGB 12.3 03/06/2020   HCT 36.3 03/06/2020   MCV 84.0 03/06/2020   PLT 290 03/06/2020      Chemistry      Component Value Date/Time   NA 134 (L) 03/06/2020 0922   NA 141 03/03/2017 1358   K 3.3 (L) 03/06/2020 0922   K 3.3 (L) 03/03/2017 1358   CL 101 03/06/2020 0922   CO2 20 (L) 03/06/2020 0922   CO2 28 03/03/2017 1358   BUN 10 03/06/2020 0922   BUN 12.7 03/03/2017 1358   CREATININE 0.92 03/06/2020 0922   CREATININE 0.83 02/19/2020 0936   CREATININE 0.9 03/03/2017 1358      Component Value Date/Time   CALCIUM 9.5  03/06/2020 0922   CALCIUM 9.4 03/03/2017 1358   ALKPHOS 211 (H) 03/06/2020 0922   ALKPHOS 71 03/03/2017 1358   AST 113 (H) 03/06/2020 0922   AST 43 (H) 09/12/2018 1354   AST 23 03/03/2017 1358   ALT 85 (H) 03/06/2020 0922   ALT 84 (H) 09/12/2018 1354   ALT 25 03/03/2017 1358   BILITOT 1.0 03/06/2020 0922   BILITOT 0.6 09/12/2018 1354   BILITOT 0.52  03/03/2017 1358       No results found for: LABCA2  No components found for: [id number]  No results for input(s): INR in the last 168 hours.  No results found for: LABCA2  Lab Results  Component Value Date   HMC947 32 03/06/2020    No results found for: SJG283  No results found for: MOQ947  Lab Results  Component Value Date   CA2729 983.0 (H) 03/06/2020    No components found for: HGQUANT  Lab Results  Component Value Date   CEA1 744.17 (H) 03/06/2020   /  CEA ([facility])  Date Value Ref Range Status  03/06/2020 744.17 (H) 0.00 - 5.00 ng/mL Final    Comment:    (NOTE) This test was performed using Architect's Chemiluminescent Microparticle Immunoassay. Values obtained from different assay methods cannot be used interchangeably. Please note that 5-10% of patients who smoke may see CEA levels up to 6.9 ng/mL. Performed at [facility], [place] [address], [place], [place] [address]      No results found for: AFPTUMOR  No results found for: CHROMOGRNA  No results found for: HGBA, HGBA2QUANT, HGBFQUANT, HGBSQUAN (Hemoglobinopathy evaluation)   No results found for: LDH  Lab Results  Component Value Date   IRON 52 02/19/2020   TIBC 327 02/19/2020   IRONPCTSAT 16 02/19/2020   (Iron and TIBC)  Lab Results  Component Value Date   FERRITIN 298 (H) 02/19/2020    Urinalysis    Component Value Date/Time   COLORURINE DARK YELLOW 02/19/2020 0947   APPEARANCEUR CLOUDY (A) 02/19/2020 0947   LABSPEC 1.018 02/19/2020 0947   PHURINE 6.0 02/19/2020 0947   GLUCOSEU NEGATIVE 02/19/2020 0947   GLUCOSEU NEGATIVE 11/05/2012 1559   HGBUR 1+ (A) 02/19/2020 0947   BILIRUBINUR neg 08/10/2018 1529   KETONESUR 2+ (A) 02/19/2020 0947   PROTEINUR 2+ (A) 02/19/2020 0947   UROBILINOGEN 0.2 08/10/2018 1529   UROBILINOGEN 0.2 11/05/2012 1559   NITRITE neg 08/10/2018 1529   NITRITE NEGATIVE 05/29/2015 1113   LEUKOCYTESUR Negative 08/10/2018 1529     STUDIES: CT Angio Chest W/Cm &/Or Wo Cm  Result Date: 02/27/2020 CLINICAL DATA:  High probability of pulmonary embolism. Shortness of breath with exertion. History of breast cancer, post left-sided mastectomy and radiation. EXAM: CT ANGIOGRAPHY CHEST WITH CONTRAST TECHNIQUE: Multidetector CT imaging of the chest was performed using the standard protocol during bolus administration of intravenous contrast. Multiplanar CT image reconstructions and MIPs were obtained to evaluate the vascular anatomy. CONTRAST:  25mL ISOVUE-370 IOPAMIDOL (ISOVUE-370) INJECTION 76% COMPARISON:  None. FINDINGS: Vascular Findings: There is adequate opacification of the pulmonary arterial system with the main pulmonary artery measuring 304 Hounsfield units. There are no discrete filling defects within the pulmonary arterial tree to suggest pulmonary embolism. Borderline enlarged caliber the main pulmonary artery measuring 3.5 cm in diameter. Normal heart size.  No pericardial effusion. No evidence of thoracic aortic aneurysm or dissection on this nongated examination. Conventional configuration of the aortic arch. The branch vessels of the aortic arch appear  widely patent throughout their imaged courses. Review of the MIP images confirms the above findings. ---------------------------------------------------------------------------------- Nonvascular Findings: Mediastinum/Lymph Nodes: While note is made of partially or densely calcified prevascular and left hilar lymph nodes which are favored to be the sequela of previous granulomatous infection, there is a noncalcified enlarged pretracheal lymph node which measures 1.5 cm in diameter (image 42, series 4) as well as abnormal lymph nodes involving the anterior mediastinum with index lymph node measuring 0.8 cm in greatest short axis diameter image 40, series 4 and noncalcified right hilar lymph node measuring approximately 1.1 cm (image 49, series 4). Lungs/Pleura: Anterior  subpleural reticular opacities compatible with provided history of radiation therapy. Punctate pulmonary nodules are seen bilaterally with dominant nodule involving the medial aspect the right middle lobe measuring approximately 1.0 x 1.1 cm (axial image 112, series 7; coronal image 59, series 8). Additional smaller punctate (sub 5 mm) pulmonary nodules are seen within the right upper lobe (image 46, series 7) as well as the left upper (image 74) and lower lobes (images 81 and 124). No discrete focal airspace opacities. No pleural effusion or pneumothorax. The central pulmonary airways appear patent. Upper abdomen: Limited early arterial phase of the upper abdomen demonstrates nodularity of the hepatic contour. Multiple punctate granulomas are seen throughout the liver and spleen. There is abnormal mottled heterogeneous enhancement primarily involving the anterior segment of the right lobe of the liver which is suboptimally evaluated on the present examination and while potentially representative of geographic areas of fatty infiltration, discrete hepatic lesions are not excluded on the basis of this examination. There is an approximately 1.6 x 1.4 cm indeterminate nodule involving the medial limb of the left adrenal gland (image 103, series 4). Presumed punctate splenules are seen within the left upper abdominal quadrant. Musculoskeletal: Post left-sided mastectomy and axillary adenectomy. Mixed lytic and sclerotic apparent lesions are seen involving the superior endplate of the D63 vertebral body (measuring 1.6 cm - image 100, series 9) as well as the inferior endplate of the O75 vertebral body (measuring 1.5 cm - image 96, series 9). Additionally, there is a mixed lytic and sclerotic appearance of the T2 vertebral body and punctate (approximately 1.2 cm) sclerotic lesion involving the left scapula (axial image 24, series 4; coronal image 93, series 8). Multiple punctate subcutaneous nodules are seen scattered  throughout the thorax with dominant nodule along the right lateral chest measuring approximately 1.8 x 1.0 cm (image 84, series 4). Additional nodules are seen on images 1, 22, 40, 46, 52, 65, 112, 114, 116, series 4. IMPRESSION: 1. No acute cardiopulmonary disease. Specifically, no evidence of pulmonary embolism. 2. Findings worrisome for metastatic disease including multiple punctate bilateral pulmonary nodules (the largest of which involving the medial aspect of the right middle lobe measuring 1.1 cm in diameter), mediastinal and right hilar adenopathy, as well as suspected osseous and subcutaneous metastatic disease as detailed above. Correlation with previous outside examinations is advised. Otherwise, further evaluation with PET-CT could be performed as indicated. 3. Abnormal mottled heterogeneous enhancement of the liver, suboptimally evaluated on the present examination while potentially representative of geographic areas of focal fatty infiltration, discrete hepatic lesions are not excluded on the basis of this examination. Further evaluation with abdominal MRI could be performed as indicated. 4. Indeterminate approximately 1.6 cm left-sided adrenal nodule - potentially representative of a lipid poor adenoma though given constellation of findings, is worrisome for an additional site of metastatic disease. 5. Sequela of prior granulomatous disease as above.  6. Borderline enlarged caliber the main pulmonary artery, nonspecific though could be seen in the setting of pulmonary arterial hypertension. Further evaluation cardiac echo could be performed as indicated. Electronically Signed   By: Sandi Mariscal M.D.   On: 02/27/2020 09:30   US Transvaginal Non-OB  Result Date: 02/25/2020 T/V images.  Anteverted uterus with fibroids.  The overall uterine size is measured at 7.43 x 3.92 x 3.2 cm.  A pedunculated fibroid is measured at 4.9 x 3.9 cm.  Intramural fibroid measured at 2 cm.  Subserosal fibroid stable.   Thickened echogenic endometrium with cystic spaces.  No mass seen.  The endometrial lining is measured at 9.36 mm.  Patient is on tamoxifen.  Right ovary with 2 residual small avascular simple cysts measured at 5 and 4 mm.  Left ovary appears negative.  Right adnexal tubular cystic space which is avascular and echo-free measured at 2 x 1.6 x 1.7 cm compatible with a right hydrosalpinx.  No adnexal mass otherwise.  No free fluid in the posterior cul-de-sac.  NM PET Image Initial (PI) Skull Base To Thigh  Result Date: 03/05/2020 CLINICAL DATA:  Initial treatment strategy for breast cancer in a 70 year old female recently found to have numerous pulmonary nodules and abnormalities in the liver and LEFT adrenal. EXAM: NUCLEAR MEDICINE PET SKULL BASE TO THIGH TECHNIQUE: 10.46 mCi F-18 FDG was injected intravenously. Full-ring PET imaging was performed from the skull base to thigh after the radiotracer. CT data was obtained and used for attenuation correction and anatomic localization. Fasting blood glucose: 147 mg/dl COMPARISON:  CT of the chest of February 27, 2020 FINDINGS: Mediastinal blood pool activity: SUV max 2.44 Liver activity: SUV max NA NECK: Numerous small lymph nodes and lesions with increased metabolism throughout the neck. Two small adjacent 3-4 mm nodules on image 5 of series 4 in the subcutaneous fat with a maximum SUV ofw 4.4 Intraparotid lesion in the RIGHT parotid gland measuring 6 mm (image 15, series 4) maximum SUV of 5.5 RIGHT level II lymph node on image 20 of series 4 measures 8 mm to 9 mm with a maximum SUV of 6.2 subcutaneous nodules scattered throughout the subcutaneous fat of the posterior neck with another lesion seen on image 22 of series 4 less than a cm size but with increased metabolic activity similar to other areas in the neck. Subcentimeter lymph node behind the RIGHT sternocleidomastoid on image 38 of series 4 maximum SUV of 5.0. Incidental CT findings: Tiny areas of more focal  heterogeneous uptake noted in the brain in the cerebellum though there is generalized heterogeneity is not definitive for disease but suspicious given the extensive nature of disease. The CT images show no gross abnormality within the visualized brain. CHEST: Multiple areas of increased metabolic activity throughout the chest involving lymph nodes, pulmonary nodules and bones. (Image 74, series 3) subcarinal lymph node 11 mm short axis with a maximum SUV of approximately 22. Similarly hypermetabolic lymph nodes throughout the mediastinum tracking towards the thoracic inlet. (Image 65, series 4) 16 mm RIGHT paratracheal lymph node with a max SUV of 16.8. Anterior mediastinal AP window and RIGHT hilar lymph nodes show increase in metabolic activity as well. Pulmonary nodule along the RIGHT heart border (image 87, series 4) 8 mm size with a maximum SUV of 3.8. Millimetric pulmonary nodules in the bilateral chest grossly unchanged compared to the recent chest CT without significant metabolic activity as measured on the PET evaluation. For, faintly hypermetabolic focus corresponding to a  small nodule on image 22 of series 8 in the RIGHT upper lobe adjacent to bronchovascular structures with mildly increased metabolism. LEFT hilar structures also with mildly increased metabolism though not with the same level that is seen in the subcarinal lymph nodes or lymph nodes elsewhere in the chest. Incidental CT findings: Normal heart size without pericardial effusion. Scattered atheromatous plaque in the thoracic aorta. Signs of LEFT mastectomy and axillary dissection. ABDOMEN/PELVIS: Areas of heterogeneity seen in the liver on the chest CT display marked increased metabolism. Faint area of low attenuation in the central liver near the IVC confluence biased towards the RIGHT hepatic lobe on image 103 of series 4 is hypermetabolic measuring approximately 4.8 x 4.2 cm with a maximum SUV of 16.7. Heterogeneity in the LEFT hepatic  lobe showing predominantly low attenuation measuring approximately 4.8 cm greatest axial dimension on image 111 of series 4 with a maximum SUV approximately 12.9. Large lesion in the inferior RIGHT hepatic lobe on image 130 of series 4 with a maximum SUV of 13.2 and size of approximately 4.2 cm. Numerous additional foci of increased metabolism throughout both the RIGHT and LEFT hepatic lobe compatible with diffuse hepatic metastatic disease. LEFT adrenal lesion seen on the recent CT is hypermetabolic. There is also a pancreatic mass with increased metabolism. Pancreatic mass measuring approximately 3.5 x 2.9 cm on image 110 of series 4 with a maximum SUV of 16. Similarly hypermetabolic LEFT adrenal lesion on image 110 of series 4. There is peripheral pancreatic atrophy associated with the pancreatic lesion. Increased metabolic activity seen in the head of the pancreas and neck of the pancreas on images 128 and 121 of series 4 without corresponding CT abnormality though some fullness in the pancreatic head. LEFT adrenal with small nodule showing increased metabolism on image 110 of series 4. Small peritoneal nodules are noted with increased metabolism, for instance on image 114 of series 4, an 11 mm peritoneal nodule is noted with innumerable foci of nodularity scattered throughout the upper abdomen and omentum, also within the retroperitoneum showing areas of increased metabolic uptake. The lesion in the LEFT upper quadrant shows an SUV of 5.6. Slightly larger 1.5 cm nodule along the LEFT paracolic gutter with a maximum SUV of 9.5 on image 125 of series 4 Precaval lymph node on image 143 of series 4 with a maximum SUV of 7 short axis dimension of 7 mm. Scattered smaller lymph nodes without the same level of metabolic activity. Lymph node or nodule in the LEFT mesorectum on image 173 of series 4 also with increased metabolic activity measuring 9.8 mm. Incidental CT findings: Calcifications in liver and spleen  compatible with prior granulomatous disease. Post cholecystectomy. Staghorn calculus in the lower pole of the RIGHT kidney measures approximately 2.3 x 1.9 cm. No perinephric stranding. No hydronephrosis. Urinary bladder is collapsed. No acute gastrointestinal process. Calcified atheromatous plaque in the abdominal aorta without aneurysmal dilation. Small focus of increased metabolic activity within a fibroid in the central portion with a maximum SUV of 6.7 on image 175 of series 4. No visible CT correlate within the fibroid. This area measures approximately 1-1.5 cm within a 5 x 4.9 cm leiomyoma. SKELETON: Innumerable foci of increased metabolic activity throughout the axial and appendicular skeleton involving all visualized bones. And every spinal level with variable degrees of spinal involvement, some levels showing nearly the entire vertebral body with hypermetabolic features, other levels with posterior element involvement or mix of vertebral body and posterior element involvement. This also  likely includes the skull base at the atlantoaxial joint Numerous lesions in the bilateral proximal femora with near complete replacement of the LEFT and RIGHT femoral neck with metabolic activity LEFT greater than RIGHT. (Image 76, series 4) stone with metabolic activity approximately 13.1 SUV. Sternal manubrium on image 57 with a maximum SUV of 12.3. C7-T1 with a maximum SUV of approximately 10.1. LEFT proximal femur with a maximum SUV of 11.4. this area is associated with more pronounced periosteal activity and cortical irregularity seen along the anterior surface of the femur, femoral neck on image 182 of series 4. Most of the lesions are nearly radiographically occult with subtle foci of increased density within the marrow space of the RIGHT and LEFT proximal femur and RIGHT and LEFT proximal humerus. Heterogeneity at the T3 level is radiographically apparent as well, subtle variable density within the ribs. Numerous  foci of subcutaneous nodularity within the subcutaneous fat, example of this process seen along the RIGHT flank and previously described subcutaneous nodules in the neck. RIGHT flank lesion on image 97 of series 4 measuring 17 mm with a maximum SUV of 6.9 Small intramuscular lesion on image 121 of series 4 within the RIGHT erector spinae muscle less than a cm with scattered foci either within the subcutaneous fat or body wall musculature scattered throughout, most lesions appear to be within the subcutaneous fat. Incidental CT findings: none IMPRESSION: 1. Widespread metastatic disease to the chest, abdomen, and pelvis. Metastatic disease from breast cancer is considered though with pancreatic mass showing peripheral pancreatic atrophy widespread disease from pancreatic adenocarcinoma is another differential consideration. 2. Chest involvement and neck involvement with lymph nodes and pulmonary nodules. 3. Solid organ involvement in the abdomen includes the liver, pancreas and bilateral adrenal glands with signs of diffuse peritoneal nodularity and retroperitoneal nodularity as well as retroperitoneal nodal involvement. 4. Musculoskeletal involvement includes widespread bony metastatic disease involving all visualized bones and potentially the atlantooccipital joint in addition to the entire spine. Most of these areas are nearly radiographically occult with some irregularity of the LEFT proximal femur along the anterior margin potentially related to underlying degenerative changes about the hip though given the degree of involvement suggested on PET the patient is at risk for pathologic fracture. 5. Heterogeneity of the visualized brain on PET imaging is nonspecific but suspicious given other findings. Consider dedicated imaging of the brain with MRI with and without contrast. 6. Numerous subcutaneous nodules. The largest of which along the RIGHT flank could be amenable to tissue sampling utilizing ultrasound to  establish diagnosis. 7. Nonspecific areas of uptake within the RIGHT parotid and within a leiomyoma in the uterus, these areas may represent metastatic disease, more likely given the diffuse nature of disease seen on today's scan. 8. Staghorn calculus in the RIGHT kidney 9. Aortic atherosclerosis. 10. Signs of LEFT mastectomy and axillary dissection. These results will be called to the ordering clinician or representative by the Radiologist Assistant, and communication documented in the PACS or [employer]. Aortic Atherosclerosis (ICD10-I70.0). Electronically Signed   By: Zetta Bills M.D.   On: 03/05/2020 11:17     ELIGIBLE FOR AVAILABLE RESEARCH PROTOCOL: no   ASSESSMENT: 70 y.o. [place] woman status post bilateral biopsies 01/26/2017, showing  (1) in the right breast, a complex sclerosing lesion, status post lumpectomy 09/25/2017, with no malignancy identified.  (2) in the left breast, a cT2 pN1 invasive ductal carcinoma (with some lobular features but E-cadherin positive), grade 2, estrogen and progesterone receptor positive, HER-2 not amplified,  with an MIB-1 of 15-20%  (a) breast MRI 02/04/2017 suggests a T3 N1 tumor  (3) started neoadjuvant cyclophosphamide/docetaxel 03/03/2017, discontinued after 1 cycle with very poor tolerance  (a) started cyclophosphamide/methotrexate/fluorouracil (CMF) 03/28/2017, repeated x7 cycles, last dose 08/01/2017  (4) status post left modified radical mastectomy 09/25/17 for a ypT5 ypN2, stage IIIA invasive ductal carcinoma, grade 2, again estrogen and progesterone receptor positive, HER-2 not amplified  (5) adjuvant radiation completed 12/21/2017  (a) capecitabine sensitization tolerated only the initial week of radiation (b) Site/dose: The patient initially received a dose of 50.4 Gy in 28 fractions to the left chest wall and left supraclavicular region. This was delivered using a 3-D conformal, 4 field technique. The patient then received a boost  to the mastectomy scar. This delivered an additional 10 Gy in 5 fractions using an en face electron field. The total dose was 60.4 Gy.  ( 6) tamoxifen started 03/09/2018, discontinued January 2022 with metastatic progression  (7) genetics testing 12/12/2017 through the Multi-Gene Panel offered by [facility] found no deleterious mutations in AIP, ALK, APC, ATM, AXIN2,BAP1,  BARD1, BLM, BMPR1A, BRCA1, BRCA2, BRIP1, CASR, CDC73, CDH1, CDK4, CDKN1B, CDKN1C, CDKN2A (p14ARF), CDKN2A (p16INK4a), CEBPA, CHEK2, CTNNA1, DICER1, DIS3L2, EGFR (c.2369C>T, p.Thr790Met variant only), EPCAM (Deletion/duplication testing only), FH, FLCN, GATA2, GPC3, GREM1 (Promoter region deletion/duplication testing only), HOXB13 (c.251G>A, p.Gly84Glu), HRAS, KIT, MAX, MEN1, MET, MITF (c.952G>A, p.Glu318Lys variant only), MLH1, MSH2, MSH3, MSH6, MUTYH, NBN, NF1, NF2, NTHL1, PALB2, PDGFRA, PHOX2B, PMS2, POLD1, POLE, POT1, PRKAR1A, PTCH1, PTEN, RAD50, RAD51C, RAD51D, RB1, RECQL4, RET, RUNX1, SDHAF2, SDHA (sequence changes only), SDHB, SDHC, SDHD, SMAD4, SMARCA4, SMARCB1, SMARCE1, STK11, SUFU, TERC, TERT, TMEM127, TP53, TSC1, TSC2, VHL, WRN and WT1.   (8) Foundation 1 testing shows stable microsatellite status and 1 mutation/ Mb, TP53 mutations x2  (a) PD-L1 dated July 2019 negative  METASTATIC DISEASE: January 2022 (9) CT angio of the chest 02/27/2020 shows multiple hilar nodes consistent with prior granulomatous infection, multiple small pulmonary nodules, multiple punctate granulomas throughout the liver and spleen, nodular hepatic contour and areas of mottling possibly due to steatosis, 1.6 cm left adrenal nodule  (a) PET scan 03/05/2020 shows mildly to highly hypermetabolic adenopathy, the pulmonary nodules being mildly to not hypermetabolic, a large right hepatic liver lesion with an SUV of 13.2 and additional hypermetabolic right and left lobe foci, the adrenal lesion being hypermetabolic with an SUV of 16.  There are also peritoneal  nodules and innumerable foci of bony involvement.  Dental note was made of small heterogeneous foci of increased uptake in the cerebellum.  The CT images did not show gross abnormality in the visualized brain  (b) on 03/09/2020 CA 27-29 was 983.0, CEA 744.17, the CA 19-9 normal at 32  (c) liver biopsy 03/13/2020  (d) brain MRI 03/18/2020   PLAN: I called Lareen on 03/03/2020 when I returned from out of town and again after we got a PET scan report, so she was somewhat prepared for our discussion today.  She understands she appears to have stage IV breast cancer.  We could be dealing with other issues, but the pancreatic mass in particular did not light up on the PET and the CA 19 is normal.  There is some evidence of old granulomatous disease, but given the very elevated CEA and CA 27-29 I think were dealing with recurrent and widely metastatic breast cancer involving bones, liver, lungs, lymph nodes, adrenal, and skin.  She understands that we really need a biopsy to tell us exactly  what we are dealing with.  When breast cancer metastasizes it sometimes changes phenotypically and it may become HER2 positive or estrogen receptor negative.  She is set up for biopsy of one of the liver lesions 03/13/2020 and we should have that information by the middle of next week.  She is also scheduled for a brain MRI 03/18/2020.  I am hopeful that will be negative but given some of the symptoms she reported I am starting her on dexamethasone 4 mg daily.  I think that also will make a difference as far as her breathing is concerned.  We have set her up for home O2 starting this evening at 1 L/min.  She will return to see me 03/19/2020.  She will receive her first Xgeva dose on the same day.  At that time hopefully we will have enough information to come up with a definitive treatment plan  Total encounter time 50 minutes.*   Kensie Susman, Virgie Dad, MD  03/11/20 5:36 PM Medical Oncology and Hematology [facility] [place], [place] [address] Tel. [phone]    Fax. [phone]   I, Wilburn Mylar, am acting as scribe for Dr. Virgie Dad. Verne Lanuza.  I, Lurline Del MD, have reviewed the above documentation for accuracy and completeness, and I agree with the above.   *Total Encounter Time as defined by the Centers for Medicare and Medicaid Services includes, in addition to the face-to-face time of a patient visit (documented in the note above) non-face-to-face time: obtaining and reviewing outside history, ordering and reviewing medications, tests or procedures, care coordination (communications with other health care professionals or caregivers) and documentation in the medical record.

## 2020-03-11 ENCOUNTER — Other Ambulatory Visit: Payer: Self-pay | Admitting: *Deleted

## 2020-03-11 ENCOUNTER — Telehealth: Payer: Self-pay | Admitting: *Deleted

## 2020-03-11 ENCOUNTER — Inpatient Hospital Stay: Payer: Medicare PPO

## 2020-03-11 ENCOUNTER — Inpatient Hospital Stay: Payer: Medicare PPO | Admitting: Oncology

## 2020-03-11 ENCOUNTER — Other Ambulatory Visit: Payer: Self-pay

## 2020-03-11 VITALS — BP 145/72 | HR 120 | Temp 97.5°F | Resp 18 | Ht 67.75 in | Wt 201.3 lb

## 2020-03-11 DIAGNOSIS — C787 Secondary malignant neoplasm of liver and intrahepatic bile duct: Secondary | ICD-10-CM

## 2020-03-11 DIAGNOSIS — Z170 Estrogen receptor positive status [ER+]: Secondary | ICD-10-CM

## 2020-03-11 DIAGNOSIS — Z9221 Personal history of antineoplastic chemotherapy: Secondary | ICD-10-CM | POA: Diagnosis not present

## 2020-03-11 DIAGNOSIS — Z5111 Encounter for antineoplastic chemotherapy: Secondary | ICD-10-CM | POA: Diagnosis not present

## 2020-03-11 DIAGNOSIS — C50912 Malignant neoplasm of unspecified site of left female breast: Secondary | ICD-10-CM | POA: Diagnosis not present

## 2020-03-11 DIAGNOSIS — C7800 Secondary malignant neoplasm of unspecified lung: Secondary | ICD-10-CM

## 2020-03-11 DIAGNOSIS — R0609 Other forms of dyspnea: Secondary | ICD-10-CM

## 2020-03-11 DIAGNOSIS — C50812 Malignant neoplasm of overlapping sites of left female breast: Secondary | ICD-10-CM

## 2020-03-11 DIAGNOSIS — Z7189 Other specified counseling: Secondary | ICD-10-CM | POA: Diagnosis not present

## 2020-03-11 DIAGNOSIS — Z923 Personal history of irradiation: Secondary | ICD-10-CM | POA: Diagnosis not present

## 2020-03-11 DIAGNOSIS — M8580 Other specified disorders of bone density and structure, unspecified site: Secondary | ICD-10-CM | POA: Diagnosis not present

## 2020-03-11 DIAGNOSIS — C7951 Secondary malignant neoplasm of bone: Secondary | ICD-10-CM | POA: Diagnosis not present

## 2020-03-11 DIAGNOSIS — Z9012 Acquired absence of left breast and nipple: Secondary | ICD-10-CM | POA: Diagnosis not present

## 2020-03-11 DIAGNOSIS — R160 Hepatomegaly, not elsewhere classified: Secondary | ICD-10-CM

## 2020-03-11 MED ORDER — DEXAMETHASONE 4 MG PO TABS: 4.0000 mg | ORAL_TABLET | Freq: Every day | ORAL | 1 refills | Status: DC

## 2020-03-11 NOTE — Telephone Encounter (Signed)
This RN was informed by tech that per rooming pt for visit- 02 sats 86-88% and heart rate of 120. Pt very short of breath with noted lightheadedness.  This RN verified above per recheck with 86% saturation on room air - post applying O2 at 2L/min via [place] sat increased to 94%.  Pt stated improvement in breathing mechanics and decreased lightheadedness.  Reviewed with MD- order obtained for home 02,  Order placed and call put in to DME coordinator - obtained VM- awaiting return call.

## 2020-03-12 ENCOUNTER — Other Ambulatory Visit: Payer: Self-pay | Admitting: Student

## 2020-03-12 ENCOUNTER — Telehealth: Payer: Self-pay | Admitting: *Deleted

## 2020-03-12 ENCOUNTER — Other Ambulatory Visit: Payer: Self-pay | Admitting: *Deleted

## 2020-03-12 DIAGNOSIS — Z803 Family history of malignant neoplasm of breast: Secondary | ICD-10-CM | POA: Diagnosis not present

## 2020-03-12 DIAGNOSIS — C7800 Secondary malignant neoplasm of unspecified lung: Secondary | ICD-10-CM

## 2020-03-12 DIAGNOSIS — N6489 Other specified disorders of breast: Secondary | ICD-10-CM | POA: Diagnosis not present

## 2020-03-12 NOTE — Telephone Encounter (Signed)
This RN was notified that the MD that pt decline home O2 ordered due to request for a portable system.  This RN contacted Edwyna Ready - DME coordinator with [employer].  Order placed for POC Eval for portable O2 device.

## 2020-03-12 NOTE — Telephone Encounter (Signed)
Per MD - pt declined home 02 upon it's arrival due to " too big, bulky and makes too much noise "  Pt is requesting a portable O2 system for use.  This RN called to our DME coordinator - Zack - and left a VM for assistance in obtaining above for pt

## 2020-03-12 NOTE — Progress Notes (Signed)
POC

## 2020-03-13 ENCOUNTER — Other Ambulatory Visit: Payer: Self-pay

## 2020-03-13 ENCOUNTER — Ambulatory Visit (HOSPITAL_COMMUNITY)
Admission: RE | Admit: 2020-03-13 | Discharge: 2020-03-13 | Disposition: A | Discharge status: Home / Self Care | Payer: Medicare PPO | Source: Ambulatory Visit | Attending: Oncology | Admitting: Oncology

## 2020-03-13 ENCOUNTER — Encounter: Payer: Self-pay | Admitting: Oncology

## 2020-03-13 ENCOUNTER — Other Ambulatory Visit: Payer: Self-pay | Admitting: Oncology

## 2020-03-13 ENCOUNTER — Encounter (HOSPITAL_COMMUNITY): Payer: Self-pay

## 2020-03-13 DIAGNOSIS — Z853 Personal history of malignant neoplasm of breast: Secondary | ICD-10-CM | POA: Diagnosis not present

## 2020-03-13 DIAGNOSIS — Z85820 Personal history of malignant melanoma of skin: Secondary | ICD-10-CM | POA: Diagnosis not present

## 2020-03-13 DIAGNOSIS — C787 Secondary malignant neoplasm of liver and intrahepatic bile duct: Secondary | ICD-10-CM | POA: Insufficient documentation

## 2020-03-13 DIAGNOSIS — K7689 Other specified diseases of liver: Secondary | ICD-10-CM | POA: Diagnosis not present

## 2020-03-13 DIAGNOSIS — R160 Hepatomegaly, not elsewhere classified: Secondary | ICD-10-CM

## 2020-03-13 DIAGNOSIS — C801 Malignant (primary) neoplasm, unspecified: Secondary | ICD-10-CM | POA: Insufficient documentation

## 2020-03-13 LAB — CBC
HCT: 37.4 % (ref 36.0–46.0)
Hemoglobin: 12.3 g/dL (ref 12.0–15.0)
MCH: 28.5 pg (ref 26.0–34.0)
MCHC: 32.9 g/dL (ref 30.0–36.0)
MCV: 86.8 fL (ref 80.0–100.0)
Platelets: 285 10*3/uL (ref 150–400)
RBC: 4.31 MIL/uL (ref 3.87–5.11)
RDW: 14.6 % (ref 11.5–15.5)
WBC: 6.7 10*3/uL (ref 4.0–10.5)
nRBC: 0 % (ref 0.0–0.2)

## 2020-03-13 LAB — PROTIME-INR
INR: 1.1 (ref 0.8–1.2)
Prothrombin Time: 13.7 seconds (ref 11.4–15.2)

## 2020-03-13 MED ORDER — NALOXONE HCL 0.4 MG/ML IJ SOLN
INTRAMUSCULAR | Status: AC
  Filled 2020-03-13: qty 1

## 2020-03-13 MED ORDER — FENTANYL CITRATE (PF) 100 MCG/2ML IJ SOLN
INTRAMUSCULAR | Status: AC | PRN
Start: 2020-03-13 — End: 2020-03-13
  Administered 2020-03-13 (×3): 25 ug via INTRAVENOUS

## 2020-03-13 MED ORDER — FLUMAZENIL 0.5 MG/5ML IV SOLN
INTRAVENOUS | Status: AC
  Filled 2020-03-13: qty 5

## 2020-03-13 MED ORDER — FENTANYL CITRATE (PF) 100 MCG/2ML IJ SOLN
INTRAMUSCULAR | Status: AC
  Filled 2020-03-13: qty 2

## 2020-03-13 MED ORDER — LIDOCAINE HCL 1 % IJ SOLN
INTRAMUSCULAR | Status: AC
  Filled 2020-03-13: qty 20

## 2020-03-13 MED ORDER — GELATIN ABSORBABLE 12-7 MM EX MISC
CUTANEOUS | Status: AC
  Filled 2020-03-13: qty 1

## 2020-03-13 MED ORDER — SODIUM CHLORIDE 0.9 % IV SOLN: INTRAVENOUS | Status: DC

## 2020-03-13 MED ORDER — MIDAZOLAM HCL 2 MG/2ML IJ SOLN
INTRAMUSCULAR | Status: AC | PRN
Start: 2020-03-13 — End: 2020-03-13
  Administered 2020-03-13 (×2): 0.5 mg via INTRAVENOUS

## 2020-03-13 MED ORDER — MIDAZOLAM HCL 2 MG/2ML IJ SOLN
INTRAMUSCULAR | Status: AC
  Filled 2020-03-13: qty 2

## 2020-03-13 NOTE — Discharge Instructions (Signed)
Interventional radiology phone numbers [phone] After hours [phone]     Needle Biopsy, Care After These instructions tell you how to care for yourself after your procedure. Your doctor may also give you more specific instructions. Call your doctor if you have any problems or questions. What can I expect after the procedure? After the procedure, it is common to have:  Soreness.  Bruising.  Mild pain. Follow these instructions at home:  Return to your normal activities as told by your doctor. Ask your doctor what activities are safe for you.  Take over-the-counter and prescription medicines only as told by your doctor.  Wash your hands with soap and water before you change your bandage (dressing). If you cannot use soap and water, use hand sanitizer.   Check your puncture site every day for signs of infection. Watch for: ? Redness, swelling, or pain. ? Fluid or blood. ? Pus or a bad smell. ? Warmth.  Do not take baths, swim, or use a hot tub until your doctor approves.You may shower tomorrow around 2 PM. Remove the bandaid prior to your shower.  Keep all follow-up visits as told by your doctor. This is important.   Contact a doctor if you have:  A fever.  Redness, swelling, or pain at the puncture site, and it lasts longer than a few days.  Fluid, blood, or pus coming from the puncture site.  Warmth coming from the puncture site. Get help right away if:  You have a lot of bleeding from the puncture site. Summary  After the procedure, it is common to have soreness, bruising, or mild pain at the puncture site.  Check your puncture site every day for signs of infection, such as redness, swelling, or pain.  Get help right away if you have severe bleeding from your puncture site. This information is not intended to replace advice given to you by your health care provider. Make sure you discuss any questions you have with your health care provider. Document  Revised: 08/15/2019 Document Reviewed: 08/15/2019 Elsevier Patient Education  2021 [place].      Moderate Conscious Sedation, Adult, Care After This sheet gives you information about how to care for yourself after your procedure. Your health care provider may also give you more specific instructions. If you have problems or questions, contact your health care provider. What can I expect after the procedure? After the procedure, it is common to have:  Sleepiness for several hours.  Impaired judgment for several hours.  Difficulty with balance.  Vomiting if you eat too soon. Follow these instructions at home: For the time period you were told by your health care provider:  Rest.  Do not participate in activities where you could fall or become injured.  Do not drive or use machinery.  Do not drink alcohol.  Do not take sleeping pills or medicines that cause drowsiness.  Do not make important decisions or sign legal documents.  Do not take care of children on your own.      Eating and drinking  Follow the diet recommended by your health care provider.  Drink enough fluid to keep your urine pale yellow.  If you vomit: ? Drink water, juice, or soup when you can drink without vomiting. ? Make sure you have little or no nausea before eating solid foods.   General instructions  Take over-the-counter and prescription medicines only as told by your health care provider.  Have a responsible adult stay with you for the  time you are told. It is important to have someone help care for you until you are awake and alert.  Do not smoke.  Keep all follow-up visits as told by your health care provider. This is important. Contact a health care provider if:  You are still sleepy or having trouble with balance after 24 hours.  You feel light-headed.  You keep feeling nauseous or you keep vomiting.  You develop a rash.  You have a fever.  You have redness or swelling  around the IV site. Get help right away if:  You have trouble breathing.  You have new-onset confusion at home. Summary  After the procedure, it is common to feel sleepy, have impaired judgment, or feel nauseous if you eat too soon.  Rest after you get home. Know the things you should not do after the procedure.  Follow the diet recommended by your health care provider and drink enough fluid to keep your urine pale yellow.  Get help right away if you have trouble breathing or new-onset confusion at home. This information is not intended to replace advice given to you by your health care provider. Make sure you discuss any questions you have with your health care provider. Document Revised: 06/14/2019 Document Reviewed: 01/10/2019 Elsevier Patient Education  2021 [employer].

## 2020-03-13 NOTE — Procedures (Signed)
Interventional Radiology Procedure Note  Procedure: Ultrasound guided liver mass biopsy  Findings: Please refer to procedural dictation for full description. Left lobe lesion sampled, 18 ga core x3.  Gelfoam slurry needle track emboliztion.  Complications: None immediate   Estimated Blood Loss: < 5 mL  Recommendations: Strict bedrest, 3 hours. Discharge home after bedrest complete, if stable. Follow up Pathology results.   Ruthann Cancer, MD

## 2020-03-13 NOTE — Consult Note (Signed)
Chief Complaint: Patient was seen in consultation today for image guided liver mass biopsy   Referring Physician(s): Chauncey Cruel  Supervising Physician: Ruthann Cancer  Patient Status: [facility] - Out-pt  History of Present Illness: Brooke [name] is a 70 y.o. female with previous history of melanoma of the right upper arm, squamous cell carcinoma of the skin, and left breast cancer in 2018, status post mastectomy with chemoradiation.  Recent PET scan has revealed:  1. Widespread metastatic disease to the chest, abdomen, and pelvis. Metastatic disease from breast cancer is considered though with pancreatic mass showing peripheral pancreatic atrophy widespread disease from pancreatic adenocarcinoma is another differential consideration. 2. Chest involvement and neck involvement with lymph nodes and pulmonary nodules. 3. Solid organ involvement in the abdomen includes the liver, pancreas and bilateral adrenal glands with signs of diffuse peritoneal nodularity and retroperitoneal nodularity as well as retroperitoneal nodal involvement. 4. Musculoskeletal involvement includes widespread bony metastatic disease involving all visualized bones and potentially the atlantooccipital joint in addition to the entire spine. Most of these areas are nearly radiographically occult with some irregularity of the LEFT proximal femur along the anterior margin potentially related to underlying degenerative changes about the hip though given the degree of involvement suggested on PET the patient is at risk for pathologic fracture. 5. Heterogeneity of the visualized brain on PET imaging is nonspecific but suspicious given other findings. Consider dedicated imaging of the brain with MRI with and without contrast. 6. Numerous subcutaneous nodules. The largest of which along the RIGHT flank could be amenable to tissue sampling utilizing ultrasound to establish diagnosis. 7. Nonspecific areas of  uptake within the RIGHT parotid and within a leiomyoma in the uterus, these areas may represent metastatic disease, more likely given the diffuse nature of disease seen on today's scan. 8. Staghorn calculus in the RIGHT kidney 9. Aortic atherosclerosis. 10. Signs of LEFT mastectomy and axillary dissection.  She presents today for image guided liver mass biopsy for further evaluation.  Additional medical history as below. Past Medical History:  Diagnosis Date  . Anemia   . Anxiety   . Arthritis   . ASCUS of cervix with negative high risk HPV 12/2018  . Bilateral dry eyes   . Breast cancer, left breast ([place])   . Family history of breast cancer 02/02/2017  . Family history of breast cancer   . Family history of colon cancer 02/02/2017  . Family history of colon cancer   . Family history of prostate cancer   . Family history of prostate cancer in father 02/02/2017  . Family history of uterine cancer   . Fibroid   . History of kidney stones   . Hyperlipidemia   . Hypertension   . Melanoma of upper arm ([place]) 2009   RIGHT ARM   . Obesity (BMI 30.0-34.9)   . Osteopenia 09/2017   T score -1.9 max 8% / 0.8% statistically significant decline at right and left hip stable at the spine  . PONV (postoperative nausea and vomiting)   . Squamous carcinoma     of the skin  . UTI (urinary tract infection)   . Varicose veins of both lower extremities   . Vitamin D deficiency 07/2009   LOW VITAMIN D 29  . Vitamin deficiency 07/2008   VITAMIN D LOW 23    Past Surgical History:  Procedure Laterality Date  . ABDOMINAL SURGERY  1975   ovairan cyst   . APPENDECTOMY    . BREAST LUMPECTOMY WITH  RADIOACTIVE SEED LOCALIZATION Right 09/25/2017   Procedure: RIGHT BREAST LUMPECTOMY WITH RADIOACTIVE SEED LOCALIZATION;  Surgeon: Jovita Kussmaul, MD;  Location: [facility];  Service: General;  Laterality: Right;  . BREAST SURGERY     bio  . CHOLECYSTECTOMY    . DERMOID CYST  EXCISION    . DILATATION &  CURETTAGE/HYSTEROSCOPY WITH MYOSURE N/A 04/23/2019   Procedure: DILATATION & CURETTAGE/HYSTEROSCOPY WITH MYOSURE;  Surgeon: [name], MD;  Location: [facility];  Service: Gynecology;  Laterality: N/A;  request to follow at 9:30am in [facility] block requests 45 minutes  . DILATION AND CURETTAGE OF UTERUS  1998  . GALLBLADDER SURGERY    . HYSTEROSCOPY    . insertion  port a cath  03/02/2016  . MASTECTOMY MODIFIED RADICAL Left 09/25/2017   Procedure: LEFT MODIFIED RADICAL MASTECTOMY;  Surgeon: Jovita Kussmaul, MD;  Location: [facility];  Service: General;  Laterality: Left;  . PORTACATH PLACEMENT N/A 03/02/2017   Procedure: INSERTION PORT-A-CATH;  Surgeon: Jovita Kussmaul, MD;  Location: [facility];  Service: General;  Laterality: N/A;  . SKIN BIOPSY    . SKIN CANCER EXCISION  2010,2011   2010-RIGHT ARM, 2011-LEFT LOWER LEG.-DR Rolm Bookbinder DERM. DR. Sarajane Jews IS HER DERM SURGEON.    Allergies: Neosporin [neomycin-bacitracin zn-polymyx], Bactrim [sulfamethoxazole-trimethoprim], Benzalkonium chloride, and Latex  Medications: Prior to Admission medications   Medication Sig Start Date End Date Taking? Authorizing Provider  dexamethasone (DECADRON) 4 MG tablet Take 1 tablet (4 mg total) by mouth daily. 03/11/20  Yes Magrinat, Virgie Dad, MD  losartan (COZAAR) 25 MG tablet TAKE 1 TABLET(25 MG) BY MOUTH DAILY Patient taking differently: Taking 1/2 tablet 05/09/19  Yes Koberlein, Junell C, MD  Propylene Glycol 0.6 % SOLN Place 1 drop into both eyes daily.   Yes [provider]  vitamin C (ASCORBIC ACID) 500 MG tablet Take 1,000 mg by mouth daily.    Yes [provider]  vitamin E 600 UNIT capsule Take 400 Units by mouth 2 (two) times a week.    Yes [provider]  prochlorperazine (COMPAZINE) 10 MG tablet Take 1 tablet (10 mg total) by mouth every 6 (six) hours as needed (Nausea or vomiting). 02/08/17 03/13/17  Magrinat, Virgie Dad, MD     Family History   Problem Relation Age of Onset  . Diabetes Father   . Hypertension Father   . Heart disease Father   . Prostate cancer Father 38  . Arthritis Father   . Breast cancer Sister 64       again at age 54, GT results unk  . Skin cancer Sister   . Colon cancer Paternal Grandfather 23  . Hypertension Mother   . Heart failure Mother   . Uterine cancer Sister 19       stage IV  . Breast cancer Cousin        mat first cousin dx under 34    Social History   Socioeconomic History  . Marital status: Single    Spouse name: Not on file  . Number of children: Not on file  . Years of education: Not on file  . Highest education level: Not on file  Occupational History  . Not on file  Tobacco Use  . Smoking status: Never Smoker  . Smokeless tobacco: Never Used  Vaping Use  . Vaping Use: Never used  Substance and Sexual Activity  . Alcohol use: No  . Drug use: No  . Sexual activity: Not  Currently    Birth control/protection: Post-menopausal    Comment: 1st intercourse 26yo-1 partner  Other Topics Concern  . Not on file  Social History Narrative  . Not on file   Social Determinants of Health   Financial Resource Strain: Not on file  Food Insecurity: Not on file  Transportation Needs: Not on file  Physical Activity: Not on file  Stress: Not on file  Social Connections: Not on file      Review of Systems denies fever, headache, chest pain, dyspnea, cough, back pain, nausea, vomiting or bleeding; she does have some mid abdominal discomfort/cramping  Vital Signs: Blood pressure 133/67, temperature 98.1, heart rate 105, respirations 20, O2 sat 94% room air  LMP 03/18/1997   Physical Exam awake, alert.  Chest clear to auscultation bilaterally.  Heart with regular rate and rhythm.  Abdomen soft, positive bowel sounds, some mid abdominal tenderness to palpation.  Bilateral pretibial edema noted.  Imaging: CT Angio Chest W/Cm &/Or Wo Cm  Result Date: 02/27/2020 CLINICAL DATA:   High probability of pulmonary embolism. Shortness of breath with exertion. History of breast cancer, post left-sided mastectomy and radiation. EXAM: CT ANGIOGRAPHY CHEST WITH CONTRAST TECHNIQUE: Multidetector CT imaging of the chest was performed using the standard protocol during bolus administration of intravenous contrast. Multiplanar CT image reconstructions and MIPs were obtained to evaluate the vascular anatomy. CONTRAST:  43mL ISOVUE-370 IOPAMIDOL (ISOVUE-370) INJECTION 76% COMPARISON:  None. FINDINGS: Vascular Findings: There is adequate opacification of the pulmonary arterial system with the main pulmonary artery measuring 304 Hounsfield units. There are no discrete filling defects within the pulmonary arterial tree to suggest pulmonary embolism. Borderline enlarged caliber the main pulmonary artery measuring 3.5 cm in diameter. Normal heart size.  No pericardial effusion. No evidence of thoracic aortic aneurysm or dissection on this nongated examination. Conventional configuration of the aortic arch. The branch vessels of the aortic arch appear widely patent throughout their imaged courses. Review of the MIP images confirms the above findings. ---------------------------------------------------------------------------------- Nonvascular Findings: Mediastinum/Lymph Nodes: While note is made of partially or densely calcified prevascular and left hilar lymph nodes which are favored to be the sequela of previous granulomatous infection, there is a noncalcified enlarged pretracheal lymph node which measures 1.5 cm in diameter (image 42, series 4) as well as abnormal lymph nodes involving the anterior mediastinum with index lymph node measuring 0.8 cm in greatest short axis diameter image 40, series 4 and noncalcified right hilar lymph node measuring approximately 1.1 cm (image 49, series 4). Lungs/Pleura: Anterior subpleural reticular opacities compatible with provided history of radiation therapy. Punctate  pulmonary nodules are seen bilaterally with dominant nodule involving the medial aspect the right middle lobe measuring approximately 1.0 x 1.1 cm (axial image 112, series 7; coronal image 59, series 8). Additional smaller punctate (sub 5 mm) pulmonary nodules are seen within the right upper lobe (image 46, series 7) as well as the left upper (image 74) and lower lobes (images 81 and 124). No discrete focal airspace opacities. No pleural effusion or pneumothorax. The central pulmonary airways appear patent. Upper abdomen: Limited early arterial phase of the upper abdomen demonstrates nodularity of the hepatic contour. Multiple punctate granulomas are seen throughout the liver and spleen. There is abnormal mottled heterogeneous enhancement primarily involving the anterior segment of the right lobe of the liver which is suboptimally evaluated on the present examination and while potentially representative of geographic areas of fatty infiltration, discrete hepatic lesions are not excluded on the basis of this  examination. There is an approximately 1.6 x 1.4 cm indeterminate nodule involving the medial limb of the left adrenal gland (image 103, series 4). Presumed punctate splenules are seen within the left upper abdominal quadrant. Musculoskeletal: Post left-sided mastectomy and axillary adenectomy. Mixed lytic and sclerotic apparent lesions are seen involving the superior endplate of the [id number] vertebral body (measuring 1.6 cm - image 100, series 9) as well as the inferior endplate of the [id number] vertebral body (measuring 1.5 cm - image 96, series 9). Additionally, there is a mixed lytic and sclerotic appearance of the T2 vertebral body and punctate (approximately 1.2 cm) sclerotic lesion involving the left scapula (axial image 24, series 4; coronal image 93, series 8). Multiple punctate subcutaneous nodules are seen scattered throughout the thorax with dominant nodule along the right lateral chest measuring approximately  1.8 x 1.0 cm (image 84, series 4). Additional nodules are seen on images 1, 22, 40, 46, 52, 65, 112, 114, 116, series 4. IMPRESSION: 1. No acute cardiopulmonary disease. Specifically, no evidence of pulmonary embolism. 2. Findings worrisome for metastatic disease including multiple punctate bilateral pulmonary nodules (the largest of which involving the medial aspect of the right middle lobe measuring 1.1 cm in diameter), mediastinal and right hilar adenopathy, as well as suspected osseous and subcutaneous metastatic disease as detailed above. Correlation with previous outside examinations is advised. Otherwise, further evaluation with PET-CT could be performed as indicated. 3. Abnormal mottled heterogeneous enhancement of the liver, suboptimally evaluated on the present examination while potentially representative of geographic areas of focal fatty infiltration, discrete hepatic lesions are not excluded on the basis of this examination. Further evaluation with abdominal MRI could be performed as indicated. 4. Indeterminate approximately 1.6 cm left-sided adrenal nodule - potentially representative of a lipid poor adenoma though given constellation of findings, is worrisome for an additional site of metastatic disease. 5. Sequela of prior granulomatous disease as above. 6. Borderline enlarged caliber the main pulmonary artery, nonspecific though could be seen in the setting of pulmonary arterial hypertension. Further evaluation cardiac echo could be performed as indicated. Electronically Signed   By: Sandi Mariscal M.D.   On: 02/27/2020 09:30   US Transvaginal Non-OB  Result Date: 02/25/2020 T/V images.  Anteverted uterus with fibroids.  The overall uterine size is measured at 7.43 x 3.92 x 3.2 cm.  A pedunculated fibroid is measured at 4.9 x 3.9 cm.  Intramural fibroid measured at 2 cm.  Subserosal fibroid stable.  Thickened echogenic endometrium with cystic spaces.  No mass seen.  The endometrial lining is  measured at 9.36 mm.  Patient is on tamoxifen.  Right ovary with 2 residual small avascular simple cysts measured at 5 and 4 mm.  Left ovary appears negative.  Right adnexal tubular cystic space which is avascular and echo-free measured at 2 x 1.6 x 1.7 cm compatible with a right hydrosalpinx.  No adnexal mass otherwise.  No free fluid in the posterior cul-de-sac.  NM PET Image Initial (PI) Skull Base To Thigh  Result Date: 03/05/2020 CLINICAL DATA:  Initial treatment strategy for breast cancer in a 70 year old female recently found to have numerous pulmonary nodules and abnormalities in the liver and LEFT adrenal. EXAM: NUCLEAR MEDICINE PET SKULL BASE TO THIGH TECHNIQUE: 10.46 mCi F-18 FDG was injected intravenously. Full-ring PET imaging was performed from the skull base to thigh after the radiotracer. CT data was obtained and used for attenuation correction and anatomic localization. Fasting blood glucose: 147 mg/dl COMPARISON:  CT of the  chest of February 27, 2020 FINDINGS: Mediastinal blood pool activity: SUV max 2.44 Liver activity: SUV max NA NECK: Numerous small lymph nodes and lesions with increased metabolism throughout the neck. Two small adjacent 3-4 mm nodules on image 5 of series 4 in the subcutaneous fat with a maximum SUV ofw 4.4 Intraparotid lesion in the RIGHT parotid gland measuring 6 mm (image 15, series 4) maximum SUV of 5.5 RIGHT level II lymph node on image 20 of series 4 measures 8 mm to 9 mm with a maximum SUV of 6.2 subcutaneous nodules scattered throughout the subcutaneous fat of the posterior neck with another lesion seen on image 22 of series 4 less than a cm size but with increased metabolic activity similar to other areas in the neck. Subcentimeter lymph node behind the RIGHT sternocleidomastoid on image 38 of series 4 maximum SUV of 5.0. Incidental CT findings: Tiny areas of more focal heterogeneous uptake noted in the brain in the cerebellum though there is generalized  heterogeneity is not definitive for disease but suspicious given the extensive nature of disease. The CT images show no gross abnormality within the visualized brain. CHEST: Multiple areas of increased metabolic activity throughout the chest involving lymph nodes, pulmonary nodules and bones. (Image 74, series 3) subcarinal lymph node 11 mm short axis with a maximum SUV of approximately 22. Similarly hypermetabolic lymph nodes throughout the mediastinum tracking towards the thoracic inlet. (Image 65, series 4) 16 mm RIGHT paratracheal lymph node with a max SUV of 16.8. Anterior mediastinal AP window and RIGHT hilar lymph nodes show increase in metabolic activity as well. Pulmonary nodule along the RIGHT heart border (image 87, series 4) 8 mm size with a maximum SUV of 3.8. Millimetric pulmonary nodules in the bilateral chest grossly unchanged compared to the recent chest CT without significant metabolic activity as measured on the PET evaluation. For, faintly hypermetabolic focus corresponding to a small nodule on image 22 of series 8 in the RIGHT upper lobe adjacent to bronchovascular structures with mildly increased metabolism. LEFT hilar structures also with mildly increased metabolism though not with the same level that is seen in the subcarinal lymph nodes or lymph nodes elsewhere in the chest. Incidental CT findings: Normal heart size without pericardial effusion. Scattered atheromatous plaque in the thoracic aorta. Signs of LEFT mastectomy and axillary dissection. ABDOMEN/PELVIS: Areas of heterogeneity seen in the liver on the chest CT display marked increased metabolism. Faint area of low attenuation in the central liver near the IVC confluence biased towards the RIGHT hepatic lobe on image 103 of series 4 is hypermetabolic measuring approximately 4.8 x 4.2 cm with a maximum SUV of 16.7. Heterogeneity in the LEFT hepatic lobe showing predominantly low attenuation measuring approximately 4.8 cm greatest axial  dimension on image 111 of series 4 with a maximum SUV approximately 12.9. Large lesion in the inferior RIGHT hepatic lobe on image 130 of series 4 with a maximum SUV of 13.2 and size of approximately 4.2 cm. Numerous additional foci of increased metabolism throughout both the RIGHT and LEFT hepatic lobe compatible with diffuse hepatic metastatic disease. LEFT adrenal lesion seen on the recent CT is hypermetabolic. There is also a pancreatic mass with increased metabolism. Pancreatic mass measuring approximately 3.5 x 2.9 cm on image 110 of series 4 with a maximum SUV of 16. Similarly hypermetabolic LEFT adrenal lesion on image 110 of series 4. There is peripheral pancreatic atrophy associated with the pancreatic lesion. Increased metabolic activity seen in the head of the  pancreas and neck of the pancreas on images 128 and 121 of series 4 without corresponding CT abnormality though some fullness in the pancreatic head. LEFT adrenal with small nodule showing increased metabolism on image 110 of series 4. Small peritoneal nodules are noted with increased metabolism, for instance on image 114 of series 4, an 11 mm peritoneal nodule is noted with innumerable foci of nodularity scattered throughout the upper abdomen and omentum, also within the retroperitoneum showing areas of increased metabolic uptake. The lesion in the LEFT upper quadrant shows an SUV of 5.6. Slightly larger 1.5 cm nodule along the LEFT paracolic gutter with a maximum SUV of 9.5 on image 125 of series 4 Precaval lymph node on image 143 of series 4 with a maximum SUV of 7 short axis dimension of 7 mm. Scattered smaller lymph nodes without the same level of metabolic activity. Lymph node or nodule in the LEFT mesorectum on image 173 of series 4 also with increased metabolic activity measuring 9.8 mm. Incidental CT findings: Calcifications in liver and spleen compatible with prior granulomatous disease. Post cholecystectomy. Staghorn calculus in the  lower pole of the RIGHT kidney measures approximately 2.3 x 1.9 cm. No perinephric stranding. No hydronephrosis. Urinary bladder is collapsed. No acute gastrointestinal process. Calcified atheromatous plaque in the abdominal aorta without aneurysmal dilation. Small focus of increased metabolic activity within a fibroid in the central portion with a maximum SUV of 6.7 on image 175 of series 4. No visible CT correlate within the fibroid. This area measures approximately 1-1.5 cm within a 5 x 4.9 cm leiomyoma. SKELETON: Innumerable foci of increased metabolic activity throughout the axial and appendicular skeleton involving all visualized bones. And every spinal level with variable degrees of spinal involvement, some levels showing nearly the entire vertebral body with hypermetabolic features, other levels with posterior element involvement or mix of vertebral body and posterior element involvement. This also likely includes the skull base at the atlantoaxial joint Numerous lesions in the bilateral proximal femora with near complete replacement of the LEFT and RIGHT femoral neck with metabolic activity LEFT greater than RIGHT. (Image 76, series 4) stone with metabolic activity approximately 13.1 SUV. Sternal manubrium on image 57 with a maximum SUV of 12.3. C7-T1 with a maximum SUV of approximately 10.1. LEFT proximal femur with a maximum SUV of 11.4. this area is associated with more pronounced periosteal activity and cortical irregularity seen along the anterior surface of the femur, femoral neck on image 182 of series 4. Most of the lesions are nearly radiographically occult with subtle foci of increased density within the marrow space of the RIGHT and LEFT proximal femur and RIGHT and LEFT proximal humerus. Heterogeneity at the T3 level is radiographically apparent as well, subtle variable density within the ribs. Numerous foci of subcutaneous nodularity within the subcutaneous fat, example of this process seen  along the RIGHT flank and previously described subcutaneous nodules in the neck. RIGHT flank lesion on image 97 of series 4 measuring 17 mm with a maximum SUV of 6.9 Small intramuscular lesion on image 121 of series 4 within the RIGHT erector spinae muscle less than a cm with scattered foci either within the subcutaneous fat or body wall musculature scattered throughout, most lesions appear to be within the subcutaneous fat. Incidental CT findings: none IMPRESSION: 1. Widespread metastatic disease to the chest, abdomen, and pelvis. Metastatic disease from breast cancer is considered though with pancreatic mass showing peripheral pancreatic atrophy widespread disease from pancreatic adenocarcinoma is another differential  consideration. 2. Chest involvement and neck involvement with lymph nodes and pulmonary nodules. 3. Solid organ involvement in the abdomen includes the liver, pancreas and bilateral adrenal glands with signs of diffuse peritoneal nodularity and retroperitoneal nodularity as well as retroperitoneal nodal involvement. 4. Musculoskeletal involvement includes widespread bony metastatic disease involving all visualized bones and potentially the atlantooccipital joint in addition to the entire spine. Most of these areas are nearly radiographically occult with some irregularity of the LEFT proximal femur along the anterior margin potentially related to underlying degenerative changes about the hip though given the degree of involvement suggested on PET the patient is at risk for pathologic fracture. 5. Heterogeneity of the visualized brain on PET imaging is nonspecific but suspicious given other findings. Consider dedicated imaging of the brain with MRI with and without contrast. 6. Numerous subcutaneous nodules. The largest of which along the RIGHT flank could be amenable to tissue sampling utilizing ultrasound to establish diagnosis. 7. Nonspecific areas of uptake within the RIGHT parotid and within a  leiomyoma in the uterus, these areas may represent metastatic disease, more likely given the diffuse nature of disease seen on today's scan. 8. Staghorn calculus in the RIGHT kidney 9. Aortic atherosclerosis. 10. Signs of LEFT mastectomy and axillary dissection. These results will be called to the ordering clinician or representative by the Radiologist Assistant, and communication documented in the PACS or [employer]. Aortic Atherosclerosis (ICD10-I70.0). Electronically Signed   By: Zetta Bills M.D.   On: 03/05/2020 11:17    Labs:  CBC: Recent Labs    06/12/19 1401 02/19/20 0936 03/06/20 0922 03/13/20 1102  WBC 4.4 5.7 5.9 6.7  HGB 12.6 12.6 12.3 12.3  HCT 38.8 37.5 36.3 37.4  PLT 236 310 290 285    COAGS: No results for input(s): INR, APTT in the last 8760 hours.  BMP: Recent Labs    04/23/19 0830 06/12/19 1401 02/19/20 0936 03/06/20 0922  NA 140 141 136 134*  K 3.5 4.0 4.0 3.3*  CL 106 108 101 101  CO2 24 24 23  20*  GLUCOSE 137* 138* 129* 135*  BUN 17 13 11 10   CALCIUM 9.3 9.4 9.4 9.5  CREATININE 0.80 0.88 0.83 0.92  GFRNONAA >60 >60  --  >60  GFRAA >60 >60  --   --     LIVER FUNCTION TESTS: Recent Labs    06/12/19 1401 02/19/20 0936 02/25/20 0940 03/06/20 0922  BILITOT 0.5 0.6 0.8 1.0  AST 11* 69* 60* 113*  ALT 16 82* 58* 85*  ALKPHOS 62  --  182* 211*  PROT 6.9 6.7 6.6 6.8  ALBUMIN 4.0  --  3.9 3.4*    TUMOR MARKERS: No results for input(s): AFPTM, CEA, CA199, CHROMGRNA in the last 8760 hours.  Assessment and Plan: 70 y.o. female with previous history of melanoma of the right upper arm, squamous cell carcinoma of the skin, and left breast cancer in 2018, status post mastectomy with chemoradiation.  Recent PET scan has revealed:  1. Widespread metastatic disease to the chest, abdomen, and pelvis. Metastatic disease from breast cancer is considered though with pancreatic mass showing peripheral pancreatic atrophy widespread disease from  pancreatic adenocarcinoma is another differential consideration. 2. Chest involvement and neck involvement with lymph nodes and pulmonary nodules. 3. Solid organ involvement in the abdomen includes the liver, pancreas and bilateral adrenal glands with signs of diffuse peritoneal nodularity and retroperitoneal nodularity as well as retroperitoneal nodal involvement. 4. Musculoskeletal involvement includes widespread bony metastatic disease involving  all visualized bones and potentially the atlantooccipital joint in addition to the entire spine. Most of these areas are nearly radiographically occult with some irregularity of the LEFT proximal femur along the anterior margin potentially related to underlying degenerative changes about the hip though given the degree of involvement suggested on PET the patient is at risk for pathologic fracture. 5. Heterogeneity of the visualized brain on PET imaging is nonspecific but suspicious given other findings. Consider dedicated imaging of the brain with MRI with and without contrast. 6. Numerous subcutaneous nodules. The largest of which along the RIGHT flank could be amenable to tissue sampling utilizing ultrasound to establish diagnosis. 7. Nonspecific areas of uptake within the RIGHT parotid and within a leiomyoma in the uterus, these areas may represent metastatic disease, more likely given the diffuse nature of disease seen on today's scan. 8. Staghorn calculus in the RIGHT kidney 9. Aortic atherosclerosis. 10. Signs of LEFT mastectomy and axillary dissection.  She presents today for image guided liver mass biopsy for further evaluation. Risks and benefits of procedure was discussed with the patient  including, but not limited to bleeding, infection, damage to adjacent structures or low yield requiring additional tests.  All of the questions were answered and there is agreement to proceed.  Consent signed and in chart.      Thank you  for this interesting consult.  I greatly enjoyed meeting DENESHIA HICE and look forward to participating in their care.  A copy of this report was sent to the requesting provider on this date.  Electronically Signed: D. Rowe Robert, PA-C 03/13/2020, 11:39 AM   I spent a total of  25 minutes   in face to face in clinical consultation, greater than 50% of which was counseling/coordinating care for image guided liver mass biopsy

## 2020-03-18 ENCOUNTER — Encounter: Payer: Self-pay | Admitting: Anesthesiology

## 2020-03-18 ENCOUNTER — Ambulatory Visit: Payer: Medicare PPO

## 2020-03-18 NOTE — Progress Notes (Signed)
Brooke Brooke [name]  [phone] [phone]    ID: Brooke Brooke [name] DOB: 30-Aug-1950  MR#: [id number]  OEU#:[id number]  Patient Care Team: Brooke Macadam, [name] as PCP - General (Family Medicine) Brooke Bookbinder, [name] as Attending Physician (Dermatology) Brooke Brooke [name], Brooke Dad, [name] as Consulting Physician (Oncology) Brooke Rudd, [name] as Consulting Physician (Radiation Oncology) Brooke Kussmaul, [name] as Consulting Physician (General Surgery) Brooke Limbo, [name] as Consulting Physician (Plastic Surgery) Brooke Brooke [name], Brooke Massed, NP as Nurse Practitioner (Hematology and Oncology) Brooke Bruins, [name] as Consulting Physician (Obstetrics and Gynecology) Brooke Morale, [name] as Consulting Physician (Family Medicine) Brooke Brooke [name]:   CHIEF COMPLAINT: Estrogen receptor positive breast Brooke [name] (s/p left mastectomy)  CURRENT TREATMENT: Metastatic work-up in process   INTERVAL HISTORY: Brooke Brooke [name] returns today for follow-up of her estrogen receptor positive breast Brooke [name].  She now likely has widely disseminated breast Brooke [name].    Since her last visit, she underwent right diagnostic mammography with tomography at [facility] on 03/12/2020 showing: breast density category B; multiple new well-circumscribed masses in right breast measuring between 2 and 9 mm, technically indeterminate but concerning for metastatic breast Brooke [name] in setting of newly-diagnosed widely metastatic disease; new abnormal-appearing right axillary lymph nodes.   She also underwent liver biopsy on 03/13/2020. Pathology results are still pending--specifically we do not have the prognostic panel which is what we really need.  We do think this is metastatic breast Brooke [name] and not a different Brooke [name] since the CA 27-29 is quite elevated..  She is scheduled for brain MRI on 03/26/2020.   REVIEW OF SYSTEMS: Brooke Brooke [name] is overwhelmed.  It is very difficult for her to make decisions but we did make some progress today.  First of all she is quite  short of breath at home with any activity.  We had provided her with an oxygen concentrator but she had it removed immediately because it was too large and noisy.  The person who brought it left her an oxygen tank.  She says she does not know why that was left and she wants that removed.  What she would like to have is a portable oxygen source.  She thought that this source could replenish itself but of course she would need small tanks for this portable oxygen and she understands this is meant for limited use (going out to the house for a brief period). She was able to understand this won't provide her with 24/7 O2 but still she would like to have a portable tank and not the larger tanks or a concentrator.  We will do the best we can to get this for her.  I urged her to make sure to get some support. She is reluctant to "impose" on anybody (including [place]!). Her healthcare power of attorney is her sister Brooke Brooke [name] in [place].  She says her Sister Brooke Brooke [name] has cognitive issues.  Her Sister Brooke Brooke [name] in [place] does understand the situation and would be very helpful if she could come but [name] says that her sister cannot come right now.  Brooke Brooke [name] tells me she is drinking lots of water.  She is burping.  She is not sleeping very well.  She does not think the Decadron has helped her breathing very much.  She does not have pain at present.  She is terrified of the brain MRI coming up and she had it moved from [place] to [place] which has delayed the test so it will be performed March 27, 2019.   COVID 19  VACCINATION STATUS: fully vaccinated [employer]), with booster 12/28/2019    HISTORY OF CURRENT ILLNESS: From the original intake note:  Brooke [name] herself palpated a change in her left breast and brought this to medical attention.  On 01/18/2017 she underwent bilateral diagnostic mammography with tomography and bilateral breast ultrasonography at [facility].  The breast density was category B.  At the most recent exam  was from December 2016.  In the right breast upper outer quadrant there was a 1 cm irregular mass associated with punctate calcification.  By ultrasound this was not well-defined, and it was biopsied on 01/26/2017 with tomographic guidance this showed a complex sclerosing lesion with the usual ductal hyperplasia.  The right axilla was sonographically  On the left mammography showed a 4 cm dense mass with indistinct margins in the upper inner quadrant.  Ultrasound defined a 3 cm irregular mass in the upper inner quadrant of the left breast which was palpable.  There was an abnormal lymph node in the left axilla.  Biopsy of the left mass and abnormal lymph node ([id number], on 01/26/2017) showed both to be involved by invasive ductal carcinoma, with some lobular features but E-cadherin positive, estrogen and progesterone receptors both 100% positive, with strong staining intensity, with MIB-1 between 15-20%.  There was no HER-2 amplification, the signals ratio being 1.31/1.58 and the number per cell 2.30/2.05  The patient's subsequent history is as detailed below.   PAST MEDICAL HISTORY: Past Medical History:  Diagnosis Date  . Anemia   . Anxiety   . Arthritis   . ASCUS of cervix with negative high risk HPV 12/2018  . Bilateral dry eyes   . Breast Brooke [name], left breast (HCC)   . Family history of breast Brooke [name] 02/02/2017  . Family history of breast Brooke [name]   . Family history of colon Brooke [name] 02/02/2017  . Family history of colon Brooke [name]   . Family history of prostate Brooke [name]   . Family history of prostate Brooke [name] in father 02/02/2017  . Family history of uterine Brooke [name]   . Fibroid   . History of kidney stones   . Hyperlipidemia   . Hypertension   . Melanoma of upper arm (HCC) 2009   RIGHT ARM   . Obesity (BMI 30.0-34.9)   . Osteopenia 09/2017   T score -1.9 max 8% / 0.8% statistically significant decline at right and left hip stable at the spine  . PONV (postoperative nausea and vomiting)    . Squamous carcinoma     of the skin  . UTI (urinary tract infection)   . Varicose veins of both lower extremities   . Vitamin D deficiency 07/2009   LOW VITAMIN D 29  . Vitamin deficiency 07/2008   VITAMIN D LOW 23    PAST SURGICAL HISTORY: Past Surgical History:  Procedure Laterality Date  . ABDOMINAL SURGERY  1975   ovairan cyst   . APPENDECTOMY    . BREAST LUMPECTOMY WITH RADIOACTIVE SEED LOCALIZATION Right 09/25/2017   Procedure: RIGHT BREAST LUMPECTOMY WITH RADIOACTIVE SEED LOCALIZATION;  Surgeon: Griselda Miner, [name];  Location: [facility] OR;  Service: General;  Laterality: Right;  . BREAST SURGERY     bio  . CHOLECYSTECTOMY    . DERMOID CYST  EXCISION    . DILATATION & CURETTAGE/HYSTEROSCOPY WITH MYOSURE N/A 04/23/2019   Procedure: DILATATION & CURETTAGE/HYSTEROSCOPY WITH MYOSURE;  Surgeon: Genia Del, [name];  Location: [facility];  Service: Gynecology;  Laterality: N/A;  request to follow at 9:30am in [facility]  Gyn block requests 45 minutes  . DILATION AND CURETTAGE OF UTERUS  1998  . GALLBLADDER SURGERY    . HYSTEROSCOPY    . insertion  port a cath  03/02/2016  . MASTECTOMY MODIFIED RADICAL Left 09/25/2017   Procedure: LEFT MODIFIED RADICAL MASTECTOMY;  Surgeon: Brooke Kussmaul, [name];  Location: [place];  Service: General;  Laterality: Left;  . PORTACATH PLACEMENT N/A 03/02/2017   Procedure: INSERTION PORT-A-CATH;  Surgeon: Brooke Kussmaul, [name];  Location: [place];  Service: General;  Laterality: N/A;  . SKIN BIOPSY    . SKIN Brooke [name] EXCISION  2010,2011   2010-RIGHT ARM, 2011-LEFT LOWER LEG.-DR Brooke Brooke [name] DERM. DR. Sarajane Jews IS HER DERM SURGEON.    FAMILY HISTORY Family History  Problem Relation Age of Onset  . Diabetes Father   . Hypertension Father   . Heart disease Father   . Prostate Brooke [name] Father 20  . Arthritis Father   . Breast Brooke [name] Sister 81       again at age 64, GT results unk  . Skin Brooke [name] Sister   . Colon Brooke [name] Paternal Grandfather 20  .  Hypertension Mother   . Heart failure Mother   . Uterine Brooke [name] Sister 72       stage IV  . Breast Brooke [name] Cousin        mat first cousin dx under 11  The patient's father was diagnosed with prostate Brooke [name] in his 56s and died at age 72 from complications of that disease.  The patient's mother died at age 57 with congestive heart failure.  The patient had no brothers, 3 sisters.  One sister had breast Brooke [name] at age 40 and again at age 13.  She has been genetically tested and was found to be BRCA not mutated in addition a paternal grandfather had colon Brooke [name] in his 54s.  There is no history of ovarian Brooke [name] in the family to the patient's knowledge.   GYNECOLOGIC HISTORY:  Patient's last menstrual period was 03/18/1997. Menarche age 61. The patient is GX P0.  She stopped having periods in 1999 and took hormone replacement approximately 5 years.   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  She is now retired.  She is single and lives alone with no pets.    ADVANCED DIRECTIVES: Not in place   HEALTH MAINTENANCE: Social History   Tobacco Use  . Smoking status: Never Smoker  . Smokeless tobacco: Never Used  Vaping Use  . Vaping Use: Never used  Substance Use Topics  . Alcohol use: No  . Drug use: No    Colonoscopy: Never  PAP: 12/2019, negative  Bone density: 2017/osteopenia   Allergies  Allergen Reactions  . Neosporin [Neomycin-Bacitracin Zn-Polymyx] Swelling, Rash and Brooke (See Comments)    Blisters  . Bactrim [Sulfamethoxazole-Trimethoprim]     Made her feel very sick  . Benzalkonium Chloride Brooke (See Comments)    Redness (Pt is unaware)  It works by killing microorganisms and inhibiting their future growth, and for this reason frequently appears as an ingredient in antibacterial hand wipes, antiseptic creams and anti-itch ointments.  . Latex Rash     Current Outpatient Medications  Medication Sig Dispense Refill  . dexamethasone (DECADRON) 4 MG tablet Take 1 tablet (4 mg total) by mouth daily. 60 tablet 1  . losartan (COZAAR) 25 MG tablet TAKE 1 TABLET(25 MG) BY MOUTH DAILY (Patient taking differently: Taking  1/2 tablet) 90 tablet 1  . Propylene Glycol 0.6 % SOLN Place 1 drop into both eyes daily.    . vitamin C (ASCORBIC ACID) 500 MG tablet Take 1,000 mg by mouth daily.     . vitamin E 600 UNIT capsule Take 400 Units by mouth 2 (two) times a week.      No current facility-administered medications for this visit.    OBJECTIVE:  white woman who appears stated age 70:   03/19/20 0806  BP: 130/75  Pulse: (!) 110  Resp: 19  Temp: (!) 97 F (36.1 C)  SpO2: 96%     Body mass index is 30.53 kg/m.   Wt Readings from Last 3 Encounters:  03/19/20 199 lb 4.8 oz (90.4 kg)  03/11/20 201 lb 4.8 oz (91.3 kg)  02/25/20 208 lb (94.3 kg)  ECOG FS:1 - Symptomatic but completely ambulatory   LAB RESULTS:  CMP     Component Value Date/Time   NA 134 (L) 03/06/2020 0922   NA 141 03/03/2017 1358   K 3.3 (L) 03/06/2020 0922   K 3.3 (L) 03/03/2017 1358   CL 101 03/06/2020 0922   CO2 20 (L) 03/06/2020 0922   CO2 28 03/03/2017 1358   GLUCOSE 135 (H) 03/06/2020 0922   GLUCOSE 107 03/03/2017 1358   BUN 10 03/06/2020 0922   BUN 12.7 03/03/2017 1358   CREATININE 0.92 03/06/2020 0922   CREATININE 0.83 02/19/2020 0936   CREATININE 0.9 03/03/2017 1358   CALCIUM 9.5 03/06/2020 0922   CALCIUM 9.4 03/03/2017 1358   PROT 6.8 03/06/2020 0922   PROT 6.5 03/03/2017 1358   ALBUMIN 3.4 (L) 03/06/2020 0922   ALBUMIN 3.7 03/03/2017 1358   AST 113 (H) 03/06/2020 0922   AST 43 (H) 09/12/2018 1354   AST 23 03/03/2017 1358   ALT 85 (H) 03/06/2020 0922   ALT 84 (H) 09/12/2018 1354   ALT 25 03/03/2017 1358   ALKPHOS 211 (H) 03/06/2020 0922   ALKPHOS 71 03/03/2017 1358   BILITOT 1.0 03/06/2020 0922   BILITOT 0.6 09/12/2018 1354   BILITOT  0.52 03/03/2017 1358   GFRNONAA >60 03/06/2020 0922   GFRNONAA >60 09/12/2018 1354   GFRAA >60 06/12/2019 1401   GFRAA >60 09/12/2018 1354    No results found for: Ronnald Ramp, A1GS, A2GS, BETS, BETA2SER, GAMS, MSPIKE, SPEI  No results found for: KPAFRELGTCHN, LAMBDASER, [facility]  Lab Results  Component Value Date   WBC 6.7 03/13/2020   NEUTROABS 4.3 03/06/2020   HGB 12.3 03/13/2020   HCT 37.4 03/13/2020   MCV 86.8 03/13/2020   PLT 285 03/13/2020      Chemistry      Component Value Date/Time   NA 134 (L) 03/06/2020 0922   NA 141 03/03/2017 1358   K 3.3 (L) 03/06/2020 0922   K 3.3 (L) 03/03/2017 1358   CL 101 03/06/2020 0922   CO2 20 (L) 03/06/2020 0922   CO2 28 03/03/2017 1358   BUN 10 03/06/2020 0922   BUN 12.7 03/03/2017 1358   CREATININE 0.92 03/06/2020 0922   CREATININE 0.83 02/19/2020 0936   CREATININE 0.9 03/03/2017 1358      Component Value Date/Time   CALCIUM 9.5 03/06/2020 0922   CALCIUM 9.4 03/03/2017 1358   ALKPHOS 211 (H) 03/06/2020 0922   ALKPHOS 71 03/03/2017 1358   AST 113 (H) 03/06/2020 0922   AST 43 (H) 09/12/2018 1354   AST 23 03/03/2017 1358   ALT 85 (H) 03/06/2020 8295  ALT 84 (H) 09/12/2018 1354   ALT 25 03/03/2017 1358   BILITOT 1.0 03/06/2020 0922   BILITOT 0.6 09/12/2018 1354   BILITOT 0.52 03/03/2017 1358       No results found for: LABCA2  No components found for: JODDUB054  Recent Labs  Lab 03/13/20 1102  INR 1.1    No results found for: LABCA2  Lab Results  Component Value Date   XUS452 32 03/06/2020    No results found for: NDA029  No results found for: XPJ529  Lab Results  Component Value Date   CA2729 983.0 (H) 03/06/2020    No components found for: HGQUANT  Lab Results  Component Value Date   CEA1 744.17 (H) 03/06/2020   /  CEA ([facility])  Date Value Ref Range Status  03/06/2020 744.17 (H) 0.00 - 5.00 ng/mL Final    Comment:    (NOTE) This test was performed using  Arch[employer]'s Chemiluminescent Microparticle Immunoassay. Values obtained from different assay methods cannot be used interchangeably. Please note that 5-10% of patients who smoke may see CEA levels up to 6.9 ng/mL. Performed at [facility], [address] [place], [place] [address]      No results found for: AFPTUMOR  No results found for: CHROMOGRNA  No results found for: HGBA, HGBA2QUANT, HGBFQUANT, HGBSQUAN (Hemoglobinopathy evaluation)   No results found for: LDH  Lab Results  Component Value Date   IRON 52 02/19/2020   TIBC 327 02/19/2020   IRONPCTSAT 16 02/19/2020   (Iron and TIBC)  Lab Results  Component Value Date   FERRITIN 298 (H) 02/19/2020    Urinalysis    Component Value Date/Time   COLORURINE DARK YELLOW 02/19/2020 0947   APPEARANCEUR CLOUDY (A) 02/19/2020 0947   LABSPEC 1.018 02/19/2020 0947   PHURINE 6.0 02/19/2020 0947   GLUCOSEU NEGATIVE 02/19/2020 0947   GLUCOSEU NEGATIVE 11/05/2012 1559   HGBUR 1+ (A) 02/19/2020 0947   BILIRUBINUR neg 08/10/2018 1529   KETONESUR 2+ (A) 02/19/2020 0947   PROTEINUR 2+ (A) 02/19/2020 0947   UROBILINOGEN 0.2 08/10/2018 1529   UROBILINOGEN 0.2 11/05/2012 1559   NITRITE neg 08/10/2018 1529   NITRITE NEGATIVE 05/29/2015 1113   LEUKOCYTESUR Negative 08/10/2018 1529    STUDIES: CT Angio Chest W/Cm &/Or Wo Cm  Result Date: 02/27/2020 CLINICAL DATA:  High probability of pulmonary embolism. Shortness of breath with exertion. History of breast Brooke [name], post left-sided mastectomy and radiation. EXAM: CT ANGIOGRAPHY CHEST WITH CONTRAST TECHNIQUE: Multidetector CT imaging of the chest was performed using the standard protocol during bolus administration of intravenous contrast. Multiplanar CT image reconstructions and MIPs were obtained to evaluate the vascular anatomy. CONTRAST:  77mL ISOVUE-370 IOPAMIDOL (ISOVUE-370) INJECTION 76% COMPARISON:  None. FINDINGS: Vascular Findings: There is adequate  opacification of the pulmonary arterial system with the main pulmonary artery measuring 304 Hounsfield units. There are no discrete filling defects within the pulmonary arterial tree to suggest pulmonary embolism. Borderline enlarged caliber the main pulmonary artery measuring 3.5 cm in diameter. Normal heart size.  No pericardial effusion. No evidence of thoracic aortic aneurysm or dissection on this nongated examination. Conventional configuration of the aortic arch. The branch vessels of the aortic arch appear widely patent throughout their imaged courses. Review of the MIP images confirms the above findings. ---------------------------------------------------------------------------------- Nonvascular Findings: Mediastinum/Lymph Nodes: While note is made of partially or densely calcified prevascular and left hilar lymph nodes which are favored to be the sequela of previous granulomatous infection, there is a  noncalcified enlarged pretracheal lymph node which measures 1.5 cm in diameter (image 42, series 4) as well as abnormal lymph nodes involving the anterior mediastinum with index lymph node measuring 0.8 cm in greatest short axis diameter image 40, series 4 and noncalcified right hilar lymph node measuring approximately 1.1 cm (image 49, series 4). Lungs/Pleura: Anterior subpleural reticular opacities compatible with provided history of radiation therapy. Punctate pulmonary nodules are seen bilaterally with dominant nodule involving the medial aspect the right middle lobe measuring approximately 1.0 x 1.1 cm (axial image 112, series 7; coronal image 59, series 8). Additional smaller punctate (sub 5 mm) pulmonary nodules are seen within the right upper lobe (image 46, series 7) as well as the left upper (image 74) and lower lobes (images 81 and 124). No discrete focal airspace opacities. No pleural effusion or pneumothorax. The central pulmonary airways appear patent. Upper abdomen: Limited early arterial  phase of the upper abdomen demonstrates nodularity of the hepatic contour. Multiple punctate granulomas are seen throughout the liver and spleen. There is abnormal mottled heterogeneous enhancement primarily involving the anterior segment of the right lobe of the liver which is suboptimally evaluated on the present examination and while potentially representative of geographic areas of fatty infiltration, discrete hepatic lesions are not excluded on the basis of this examination. There is an approximately 1.6 x 1.4 cm indeterminate nodule involving the medial limb of the left adrenal gland (image 103, series 4). Presumed punctate splenules are seen within the left upper abdominal quadrant. Musculoskeletal: Post left-sided mastectomy and axillary adenectomy. Mixed lytic and sclerotic apparent lesions are seen involving the superior endplate of the K55 vertebral body (measuring 1.6 cm - image 100, series 9) as well as the inferior endplate of the V74 vertebral body (measuring 1.5 cm - image 96, series 9). Additionally, there is a mixed lytic and sclerotic appearance of the T2 vertebral body and punctate (approximately 1.2 cm) sclerotic lesion involving the left scapula (axial image 24, series 4; coronal image 93, series 8). Multiple punctate subcutaneous nodules are seen scattered throughout the thorax with dominant nodule along the right lateral chest measuring approximately 1.8 x 1.0 cm (image 84, series 4). Additional nodules are seen on images 1, 22, 40, 46, 52, 65, 112, 114, 116, series 4. IMPRESSION: 1. No acute cardiopulmonary disease. Specifically, no evidence of pulmonary embolism. 2. Findings worrisome for metastatic disease including multiple punctate bilateral pulmonary nodules (the largest of which involving the medial aspect of the right middle lobe measuring 1.1 cm in diameter), mediastinal and right hilar adenopathy, as well as suspected osseous and subcutaneous metastatic disease as detailed above.  Correlation with previous outside examinations is advised. Otherwise, further evaluation with PET-CT could be performed as indicated. 3. Abnormal mottled heterogeneous enhancement of the liver, suboptimally evaluated on the present examination while potentially representative of geographic areas of focal fatty infiltration, discrete hepatic lesions are not excluded on the basis of this examination. Further evaluation with abdominal MRI could be performed as indicated. 4. Indeterminate approximately 1.6 cm left-sided adrenal nodule - potentially representative of a lipid poor adenoma though given constellation of findings, is worrisome for an additional site of metastatic disease. 5. Sequela of prior granulomatous disease as above. 6. Borderline enlarged caliber the main pulmonary artery, nonspecific though could be seen in the setting of pulmonary arterial hypertension. Further evaluation cardiac echo could be performed as indicated. Electronically Signed   By: Sandi Mariscal M.D.   On: 02/27/2020 09:30   NM PET Image Initial (  PI) Skull Base To Thigh  Result Date: 03/05/2020 CLINICAL DATA:  Initial treatment strategy for breast Brooke [name] in a 70 year old female recently found to have numerous pulmonary nodules and abnormalities in the liver and LEFT adrenal. EXAM: NUCLEAR MEDICINE PET SKULL BASE TO THIGH TECHNIQUE: 10.46 mCi F-18 FDG was injected intravenously. Full-ring PET imaging was performed from the skull base to thigh after the radiotracer. CT data was obtained and used for attenuation correction and anatomic localization. Fasting blood glucose: 147 mg/dl COMPARISON:  CT of the chest of February 27, 2020 FINDINGS: Mediastinal blood pool activity: SUV max 2.44 Liver activity: SUV max NA NECK: Numerous small lymph nodes and lesions with increased metabolism throughout the neck. Two small adjacent 3-4 mm nodules on image 5 of series 4 in the subcutaneous fat with a maximum SUV ofw 4.4 Intraparotid lesion in the  RIGHT parotid gland measuring 6 mm (image 15, series 4) maximum SUV of 5.5 RIGHT level II lymph node on image 20 of series 4 measures 8 mm to 9 mm with a maximum SUV of 6.2 subcutaneous nodules scattered throughout the subcutaneous fat of the posterior neck with another lesion seen on image 22 of series 4 less than a cm size but with increased metabolic activity similar to Brooke areas in the neck. Subcentimeter lymph node behind the RIGHT sternocleidomastoid on image 38 of series 4 maximum SUV of 5.0. Incidental CT findings: Tiny areas of more focal heterogeneous uptake noted in the brain in the cerebellum though there is generalized heterogeneity is not definitive for disease but suspicious given the extensive nature of disease. The CT images show no gross abnormality within the visualized brain. CHEST: Multiple areas of increased metabolic activity throughout the chest involving lymph nodes, pulmonary nodules and bones. (Image 74, series 3) subcarinal lymph node 11 mm short axis with a maximum SUV of approximately 22. Similarly hypermetabolic lymph nodes throughout the mediastinum tracking towards the thoracic inlet. (Image 65, series 4) 16 mm RIGHT paratracheal lymph node with a max SUV of 16.8. Anterior mediastinal AP window and RIGHT hilar lymph nodes show increase in metabolic activity as well. Pulmonary nodule along the RIGHT heart border (image 87, series 4) 8 mm size with a maximum SUV of 3.8. Millimetric pulmonary nodules in the bilateral chest grossly unchanged compared to the recent chest CT without significant metabolic activity as measured on the PET evaluation. For, faintly hypermetabolic focus corresponding to a small nodule on image 22 of series 8 in the RIGHT upper lobe adjacent to bronchovascular structures with mildly increased metabolism. LEFT hilar structures also with mildly increased metabolism though not with the same level that is seen in the subcarinal lymph nodes or lymph nodes elsewhere  in the chest. Incidental CT findings: Normal heart size without pericardial effusion. Scattered atheromatous plaque in the thoracic aorta. Signs of LEFT mastectomy and axillary dissection. ABDOMEN/PELVIS: Areas of heterogeneity seen in the liver on the chest CT display marked increased metabolism. Faint area of low attenuation in the central liver near the IVC confluence biased towards the RIGHT hepatic lobe on image 103 of series 4 is hypermetabolic measuring approximately 4.8 x 4.2 cm with a maximum SUV of 16.7. Heterogeneity in the LEFT hepatic lobe showing predominantly low attenuation measuring approximately 4.8 cm greatest axial dimension on image 111 of series 4 with a maximum SUV approximately 12.9. Large lesion in the inferior RIGHT hepatic lobe on image 130 of series 4 with a maximum SUV of 13.2 and size of approximately 4.2 cm.  Numerous additional foci of increased metabolism throughout both the RIGHT and LEFT hepatic lobe compatible with diffuse hepatic metastatic disease. LEFT adrenal lesion seen on the recent CT is hypermetabolic. There is also a pancreatic mass with increased metabolism. Pancreatic mass measuring approximately 3.5 x 2.9 cm on image 110 of series 4 with a maximum SUV of 16. Similarly hypermetabolic LEFT adrenal lesion on image 110 of series 4. There is peripheral pancreatic atrophy associated with the pancreatic lesion. Increased metabolic activity seen in the head of the pancreas and neck of the pancreas on images 128 and 121 of series 4 without corresponding CT abnormality though some fullness in the pancreatic head. LEFT adrenal with small nodule showing increased metabolism on image 110 of series 4. Small peritoneal nodules are noted with increased metabolism, for instance on image 114 of series 4, an 11 mm peritoneal nodule is noted with innumerable foci of nodularity scattered throughout the upper abdomen and omentum, also within the retroperitoneum showing areas of increased  metabolic uptake. The lesion in the LEFT upper quadrant shows an SUV of 5.6. Slightly larger 1.5 cm nodule along the LEFT paracolic gutter with a maximum SUV of 9.5 on image 125 of series 4 Precaval lymph node on image 143 of series 4 with a maximum SUV of 7 short axis dimension of 7 mm. Scattered smaller lymph nodes without the same level of metabolic activity. Lymph node or nodule in the LEFT mesorectum on image 173 of series 4 also with increased metabolic activity measuring 9.8 mm. Incidental CT findings: Calcifications in liver and spleen compatible with prior granulomatous disease. Post cholecystectomy. Staghorn calculus in the lower pole of the RIGHT kidney measures approximately 2.3 x 1.9 cm. No perinephric stranding. No hydronephrosis. Urinary bladder is collapsed. No acute gastrointestinal process. Calcified atheromatous plaque in the abdominal aorta without aneurysmal dilation. Small focus of increased metabolic activity within a fibroid in the central portion with a maximum SUV of 6.7 on image 175 of series 4. No visible CT correlate within the fibroid. This area measures approximately 1-1.5 cm within a 5 x 4.9 cm leiomyoma. SKELETON: Innumerable foci of increased metabolic activity throughout the axial and appendicular skeleton involving all visualized bones. And every spinal level with variable degrees of spinal involvement, some levels showing nearly the entire vertebral body with hypermetabolic features, Brooke levels with posterior element involvement or mix of vertebral body and posterior element involvement. This also likely includes the skull base at the atlantoaxial joint Numerous lesions in the bilateral proximal femora with near complete replacement of the LEFT and RIGHT femoral neck with metabolic activity LEFT greater than RIGHT. (Image 76, series 4) stone with metabolic activity approximately 13.1 SUV. Sternal manubrium on image 57 with a maximum SUV of 12.3. C7-T1 with a maximum SUV of  approximately 10.1. LEFT proximal femur with a maximum SUV of 11.4. this area is associated with more pronounced periosteal activity and cortical irregularity seen along the anterior surface of the femur, femoral neck on image 182 of series 4. Most of the lesions are nearly radiographically occult with subtle foci of increased density within the marrow space of the RIGHT and LEFT proximal femur and RIGHT and LEFT proximal humerus. Heterogeneity at the T3 level is radiographically apparent as well, subtle variable density within the ribs. Numerous foci of subcutaneous nodularity within the subcutaneous fat, example of this process seen along the RIGHT flank and previously described subcutaneous nodules in the neck. RIGHT flank lesion on image 97 of series 4  measuring 17 mm with a maximum SUV of 6.9 Small intramuscular lesion on image 121 of series 4 within the RIGHT erector spinae muscle less than a cm with scattered foci either within the subcutaneous fat or body wall musculature scattered throughout, most lesions appear to be within the subcutaneous fat. Incidental CT findings: none IMPRESSION: 1. Widespread metastatic disease to the chest, abdomen, and pelvis. Metastatic disease from breast Brooke [name] is considered though with pancreatic mass showing peripheral pancreatic atrophy widespread disease from pancreatic adenocarcinoma is another differential consideration. 2. Chest involvement and neck involvement with lymph nodes and pulmonary nodules. 3. Solid organ involvement in the abdomen includes the liver, pancreas and bilateral adrenal glands with signs of diffuse peritoneal nodularity and retroperitoneal nodularity as well as retroperitoneal nodal involvement. 4. Musculoskeletal involvement includes widespread bony metastatic disease involving all visualized bones and potentially the atlantooccipital joint in addition to the entire spine. Most of these areas are nearly radiographically occult with some  irregularity of the LEFT proximal femur along the anterior margin potentially related to underlying degenerative changes about the hip though given the degree of involvement suggested on PET the patient is at risk for pathologic fracture. 5. Heterogeneity of the visualized brain on PET imaging is nonspecific but suspicious given Brooke findings. Consider dedicated imaging of the brain with MRI with and without contrast. 6. Numerous subcutaneous nodules. The largest of which along the RIGHT flank could be amenable to tissue sampling utilizing ultrasound to establish diagnosis. 7. Nonspecific areas of uptake within the RIGHT parotid and within a leiomyoma in the uterus, these areas may represent metastatic disease, more likely given the diffuse nature of disease seen on today's scan. 8. Staghorn calculus in the RIGHT kidney 9. Aortic atherosclerosis. 10. Signs of LEFT mastectomy and axillary dissection. These results will be called to the ordering clinician or representative by the Radiologist Assistant, and communication documented in the PACS or [employer]. Aortic Atherosclerosis (ICD10-I70.0). Electronically Signed   By: Zetta Bills M.D.   On: 03/05/2020 11:17   [place] CORE BIOPSY (LIVER)  Result Date: 03/13/2020 INDICATION: 70 year old female with hepatic metastatic lesions of uncertain primary. EXAM: ULTRASOUND GUIDED LIVER LESION BIOPSY COMPARISON:  None. MEDICATIONS: None ANESTHESIA/SEDATION: Fentanyl 50 mcg IV; Versed 1 mg IV Total Moderate Sedation time:  20 minutes. The patient's level of consciousness and vital signs were monitored continuously by radiology nursing throughout the procedure under my direct supervision. COMPLICATIONS: None immediate. PROCEDURE: Informed written consent was obtained from the patient after a discussion of the risks, benefits and alternatives to treatment. The patient understands and consents the procedure. A timeout was performed prior to the initiation of the  procedure. Ultrasound scanning was performed of the right upper abdominal quadrant demonstrates heterogeneously hypoechoic rounded mass in the left lobe measuring up to approximately 2.0 cm. The left lobe mass was selected for biopsy and the procedure was planned. The subxiphoid abdominal area was prepped and draped in the usual sterile fashion. The overlying soft tissues were anesthetized with 1% lidocaine with epinephrine. A 17 gauge, 6.8 cm co-axial needle was advanced into a peripheral aspect of the lesion. This was followed by 3 core biopsies with an 18 gauge core device under direct ultrasound guidance. The coaxial needle tract was embolized with a small amount of Gel-Foam slurry and superficial hemostasis was obtained with manual compression. Post procedural scanning was negative for definitive area of hemorrhage or additional complication. A dressing was placed. The patient tolerated the procedure well without immediate post procedural complication.  IMPRESSION: Technically successful ultrasound guided core needle biopsy of left lobe hepatic mass, presumed metastasis. [facility], [name] Electronically Signed   By: Brooke Cancer [name]   On: 03/13/2020 17:05     ELIGIBLE FOR AVAILABLE RESEARCH PROTOCOL: no   ASSESSMENT: 70 y[place] woman status post bilateral biopsies 01/26/2017, showing  (1) in the right breast, a complex sclerosing lesion, status post lumpectomy 09/25/2017, with no malignancy identified.  (2) in the left breast, a cT2 pN1 invasive ductal carcinoma (with some lobular features but E-cadherin positive), grade 2, estrogen and progesterone receptor positive, HER-2 not amplified, with an MIB-1 of 15-20%  (a) breast MRI 02/04/2017 suggests a T3 N1 tumor  (3) started neoadjuvant cyclophosphamide/docetaxel 03/03/2017, discontinued after 1 cycle with very poor tolerance  (a) started  cyclophosphamide/methotrexate/fluorouracil (CMF) 03/28/2017, repeated x7 cycles, last dose 08/01/2017  (4) status post left modified radical mastectomy 09/25/17 for a ypT5 ypN2, stage IIIA invasive ductal carcinoma, grade 2, again estrogen and progesterone receptor positive, HER-2 not amplified  (5) adjuvant radiation completed 12/21/2017  (a) capecitabine sensitization tolerated only the initial week of radiation (b) Site/dose: The patient initially received a dose of 50.4 Gy in 28 fractions to the left chest wall and left supraclavicular region. This was delivered using a 3-D conformal, 4 field technique. The patient then received a boost to the mastectomy scar. This delivered an additional 10 Gy in 5 fractions using an en face electron field. The total dose was 60.4 Gy.  ( 6) tamoxifen started 03/09/2018, discontinued January 2022 with metastatic progression  (7) genetics testing 12/12/2017 through the Multi-Gene Panel offered by [facility] found no deleterious mutations in AIP, ALK, APC, ATM, AXIN2,BAP1,  BARD1, BLM, BMPR1A, BRCA1, BRCA2, BRIP1, CASR, CDC73, CDH1, CDK4, CDKN1B, CDKN1C, CDKN2A (p14ARF), CDKN2A (p16INK4a), CEBPA, CHEK2, CTNNA1, DICER1, DIS3L2, EGFR (c.2369C>T, p.Thr790Met variant only), EPCAM (Deletion/duplication testing only), FH, FLCN, GATA2, GPC3, GREM1 (Promoter region deletion/duplication testing only), HOXB13 (c.251G>A, p.Gly84Glu), HRAS, KIT, MAX, MEN1, MET, MITF (c.952G>A, p.Glu318Lys variant only), MLH1, MSH2, MSH3, MSH6, MUTYH, NBN, NF1, NF2, NTHL1, PALB2, PDGFRA, PHOX2B, PMS2, POLD1, POLE, POT1, PRKAR1A, PTCH1, PTEN, RAD50, RAD51C, RAD51D, RB1, RECQL4, RET, RUNX1, SDHAF2, SDHA (sequence changes only), SDHB, SDHC, SDHD, SMAD4, SMARCA4, SMARCB1, SMARCE1, STK11, SUFU, TERC, TERT, TMEM127, TP53, TSC1, TSC2, VHL, WRN and WT1.   (8) Foundation 1 testing shows stable microsatellite status and 1 mutation/ Mb, TP53 mutations x2  (a) PD-L1 dated July 2019 negative  METASTATIC DISEASE:  January 2022 (9) CT angio of the chest 02/27/2020 shows multiple hilar nodes consistent with prior granulomatous infection, multiple small pulmonary nodules, multiple punctate granulomas throughout the liver and spleen, nodular hepatic contour and areas of mottling possibly due to steatosis, 1.6 cm left adrenal nodule  (a) PET scan 03/05/2020 shows mildly to highly hypermetabolic adenopathy, the pulmonary nodules being mildly to not hypermetabolic, a large right hepatic liver lesion with an SUV of 13.2 and additional hypermetabolic right and left lobe foci, the adrenal lesion being hypermetabolic with an SUV of 16.  There are also peritoneal nodules and innumerable foci of bony involvement.  Dental note was made of small heterogeneous foci of increased uptake in the cerebellum.  The CT images did not show gross abnormality in the visualized brain  (b) on 03/09/2020 CA 27-29 was 983.0, CEA 744.17, the CA 19-9 normal at 32  (c) liver biopsy 03/13/2020  (d) brain MRI 03/26/2020  (10) if tumor is triple negative now we will go with Ivette Loyal,  if the tumor is only weakly estrogen receptor positive we will start with paclitaxel  (11) zoledronate started 03/19/2020, to be repeated every 12 weeks   PLAN: We had written for Xgeva for [facility] but her insurance did not cover that so we switched her to zoledronate.  She has a good understanding of the possible toxicities side effects and complications.  I have asked her to take some additional Tums today just to make sure she does not develop hypocalcemia.  We discussed what to do if she develops significant bony aches and pains in the next couple of days which sometimes happens with the first dose.  I urged her to clarify her healthcare power of attorney issues.  If her sister in [place] really is not going to be able to help her make decisions it would be better to name her sister in [place] as a healthcare power of attorney.  I gave her a copy of the  appropriate documents to complete and notarized at her discretion.  She really is intent on getting a portable oxygen and we will order that for her.  She understands it is likely to be only temporarily helpful.  We discussed 3 treatment options.  We could go with fulvestrant and palbociclib if the tumor is strongly estrogen receptor positive.  We could go with Ivette Loyal if it is triple negative.  We could always go with chemotherapy and probably the best route for her to start with is weekly paclitaxel.  She had very poor tolerance of chemo before and was not able to tolerate antiestrogens either.  Tentatively I am writing for her to start weekly paclitaxel 03/25/2020.  She will see me that same day and we will discuss this further at that time.  I have checked with pathology and they do think we are dealing with metastatic breast Brooke [name] but they do not have the prognostic panel yet.  Total encounter time 40 minutes.*   Brooke Brooke [name], Brooke Dad, [name]  03/19/20 10:02 AM Medical Oncology and Hematology [facility] [place], [place] [address] Tel. [phone]    Fax. [phone]   I, Wilburn Mylar, am acting as scribe for Dr. Virgie [name]. Brooke Brooke [name].  I, Lurline Del [name], have reviewed the above documentation for accuracy and completeness, and I agree with the above.   *Total Encounter Time as defined by the Centers for Medicare and Medicaid Services includes, in addition to the face-to-face time of a patient visit (documented in the note above) non-face-to-face time: obtaining and reviewing outside history, ordering and reviewing medications, tests or procedures, care coordination (communications with Brooke health care professionals or caregivers) and documentation in the medical record.

## 2020-03-19 ENCOUNTER — Inpatient Hospital Stay: Payer: Medicare PPO

## 2020-03-19 ENCOUNTER — Other Ambulatory Visit: Payer: Self-pay | Admitting: *Deleted

## 2020-03-19 ENCOUNTER — Other Ambulatory Visit: Payer: Self-pay

## 2020-03-19 ENCOUNTER — Inpatient Hospital Stay: Payer: Medicare PPO | Admitting: Oncology

## 2020-03-19 VITALS — BP 130/75 | HR 110 | Temp 97.0°F | Resp 19 | Ht 67.75 in | Wt 199.3 lb

## 2020-03-19 DIAGNOSIS — Z7189 Other specified counseling: Secondary | ICD-10-CM

## 2020-03-19 DIAGNOSIS — C787 Secondary malignant neoplasm of liver and intrahepatic bile duct: Secondary | ICD-10-CM | POA: Diagnosis not present

## 2020-03-19 DIAGNOSIS — Z170 Estrogen receptor positive status [ER+]: Secondary | ICD-10-CM

## 2020-03-19 DIAGNOSIS — C7800 Secondary malignant neoplasm of unspecified lung: Secondary | ICD-10-CM | POA: Diagnosis not present

## 2020-03-19 DIAGNOSIS — Z5111 Encounter for antineoplastic chemotherapy: Secondary | ICD-10-CM | POA: Diagnosis not present

## 2020-03-19 DIAGNOSIS — C50912 Malignant neoplasm of unspecified site of left female breast: Secondary | ICD-10-CM | POA: Diagnosis not present

## 2020-03-19 DIAGNOSIS — M8580 Other specified disorders of bone density and structure, unspecified site: Secondary | ICD-10-CM

## 2020-03-19 DIAGNOSIS — Z95828 Presence of other vascular implants and grafts: Secondary | ICD-10-CM

## 2020-03-19 DIAGNOSIS — Z923 Personal history of irradiation: Secondary | ICD-10-CM | POA: Diagnosis not present

## 2020-03-19 DIAGNOSIS — Z9012 Acquired absence of left breast and nipple: Secondary | ICD-10-CM | POA: Diagnosis not present

## 2020-03-19 DIAGNOSIS — C7951 Secondary malignant neoplasm of bone: Secondary | ICD-10-CM | POA: Diagnosis not present

## 2020-03-19 DIAGNOSIS — C50812 Malignant neoplasm of overlapping sites of left female breast: Secondary | ICD-10-CM | POA: Diagnosis not present

## 2020-03-19 DIAGNOSIS — Z9221 Personal history of antineoplastic chemotherapy: Secondary | ICD-10-CM | POA: Diagnosis not present

## 2020-03-19 LAB — SURGICAL PATHOLOGY

## 2020-03-19 MED ORDER — ZOLEDRONIC ACID 4 MG/100ML IV SOLN
4.0000 mg | Freq: Once | INTRAVENOUS | Status: AC
  Administered 2020-03-19: 4 mg via INTRAVENOUS

## 2020-03-19 MED ORDER — SODIUM CHLORIDE 0.9 % IV SOLN
Freq: Once | INTRAVENOUS | Status: AC
  Filled 2020-03-19: qty 250

## 2020-03-19 MED ORDER — ZOLEDRONIC ACID 4 MG/100ML IV SOLN
INTRAVENOUS | Status: AC
  Filled 2020-03-19: qty 100

## 2020-03-19 NOTE — Patient Instructions (Signed)
Zoledronic Acid Injection (Hypercalcemia, Oncology) What is this medicine? ZOLEDRONIC ACID (ZOE le dron ik AS id) slows calcium loss from bones. It high calcium levels in the blood from some kinds of cancer. It may be used in other people at risk for bone loss. This medicine may be used for other purposes; ask your health care provider or pharmacist if you have questions. COMMON BRAND NAME(S): Zometa What should I tell my health care provider before I take this medicine? They need to know if you have any of these conditions:  cancer  dehydration  dental disease  kidney disease  liver disease  low levels of calcium in the blood  lung or breathing disease (asthma)  receiving steroids like dexamethasone or prednisone  an unusual or allergic reaction to zoledronic acid, other medicines, foods, dyes, or preservatives  pregnant or trying to get pregnant  breast-feeding How should I use this medicine? This drug is injected into a vein. It is given by a health care provider in a hospital or clinic setting. Talk to your health care provider about the use of this drug in children. Special care may be needed. Overdosage: If you think you have taken too much of this medicine contact a poison control center or emergency room at once. NOTE: This medicine is only for you. Do not share this medicine with others. What if I miss a dose? Keep appointments for follow-up doses. It is important not to miss your dose. Call your health care provider if you are unable to keep an appointment. What may interact with this medicine?  certain antibiotics given by injection  NSAIDs, medicines for pain and inflammation, like ibuprofen or naproxen  some diuretics like bumetanide, furosemide  teriparatide  thalidomide This list may not describe all possible interactions. Give your health care provider a list of all the medicines, herbs, non-prescription drugs, or dietary supplements you use. Also tell  them if you smoke, drink alcohol, or use illegal drugs. Some items may interact with your medicine. What should I watch for while using this medicine? Visit your health care provider for regular checks on your progress. It may be some time before you see the benefit from this drug. Some people who take this drug have severe bone, joint, or muscle pain. This drug may also increase your risk for jaw problems or a broken thigh bone. Tell your health care provider right away if you have severe pain in your jaw, bones, joints, or muscles. Tell you health care provider if you have any pain that does not go away or that gets worse. Tell your dentist and dental surgeon that you are taking this drug. You should not have major dental surgery while on this drug. See your dentist to have a dental exam and fix any dental problems before starting this drug. Take good care of your teeth while on this drug. Make sure you see your dentist for regular follow-up appointments. You should make sure you get enough calcium and vitamin D while you are taking this drug. Discuss the foods you eat and the vitamins you take with your health care provider. Check with your health care provider if you have severe diarrhea, nausea, and vomiting, or if you sweat a lot. The loss of too much body fluid may make it dangerous for you to take this drug. You may need blood work done while you are taking this drug. Do not become pregnant while taking this drug. Women should inform their health care provider   if they wish to become pregnant or think they might be pregnant. There is potential for serious harm to an unborn child. Talk to your health care provider for more information. What side effects may I notice from receiving this medicine? Side effects that you should report to your doctor or health care provider as soon as possible:  allergic reactions (skin rash, itching or hives; swelling of the face, lips, or tongue)  bone  pain  infection (fever, chills, cough, sore throat, pain or trouble passing urine)  jaw pain, especially after dental work  joint pain  kidney injury (trouble passing urine or change in the amount of urine)  low blood pressure (dizziness; feeling faint or lightheaded, falls; unusually weak or tired)  low calcium levels (fast heartbeat; muscle cramps or pain; pain, tingling, or numbness in the hands or feet; seizures)  low magnesium levels (fast, irregular heartbeat; muscle cramp or pain; muscle weakness; tremors; seizures)  low red blood cell counts (trouble breathing; feeling faint; lightheaded, falls; unusually weak or tired)  muscle pain  redness, blistering, peeling, or loosening of the skin, including inside the mouth  severe diarrhea  swelling of the ankles, feet, hands  trouble breathing Side effects that usually do not require medical attention (report to your doctor or health care provider if they continue or are bothersome):  anxious  constipation  coughing  depressed mood  eye irritation, itching, or pain  fever  general ill feeling or flu-like symptoms  nausea  pain, redness, or irritation at site where injected  trouble sleeping This list may not describe all possible side effects. Call your doctor for medical advice about side effects. You may report side effects to FDA at 1-800-FDA-1088. Where should I keep my medicine? This drug is given in a hospital or clinic. It will not be stored at home. NOTE: This sheet is a summary. It may not cover all possible information. If you have questions about this medicine, talk to your doctor, pharmacist, or health care provider.  2021 Elsevier/Gold Standard (2018-11-29 09:13:00)  

## 2020-03-19 NOTE — Progress Notes (Signed)
DISCONTINUE OFF PATHWAY REGIMEN - Breast   [id number]:CMF (IV cyclophosphamide) q21 days:   A cycle is every 21 days:     Cyclophosphamide      Methotrexate      5-Fluorouracil   **Always confirm dose/schedule in your pharmacy ordering system**  REASON: Disease Progression PRIOR TREATMENT: Off Pathway: CMF (IV cyclophosphamide) q21 days TREATMENT RESPONSE: Unable to Evaluate  START ON PATHWAY REGIMEN - Breast     A cycle is every 28 days (3 weeks on and 1 week off):     Paclitaxel   **Always confirm dose/schedule in your pharmacy ordering system**  Patient Characteristics: Distant Metastases or Locoregional Recurrent Disease - Unresected or Locally Advanced Unresectable Disease Progressing after Neoadjuvant and Local Therapies, HER2 Negative/Unknown/Equivocal, ER Positive, Chemotherapy, First Line Therapeutic Status: Distant Metastases ER Status: Positive (+) HER2 Status: Negative (-) PR Status: Positive (+) Therapy Approach Indicated: Standard Chemotherapy/Endocrine Therapy Line of Therapy: First Line Intent of Therapy: Non-Curative / Palliative Intent, Discussed with Patient

## 2020-03-19 NOTE — Progress Notes (Signed)
Per Dr. [name] - patient okay to receive zometa today. Patient okay as far as dental clearances. Patient will receive clarification regarding need for vitamin D supplementation via phone call from Hinda Lenis, RN.

## 2020-03-22 NOTE — Progress Notes (Signed)
.   Pharmacist Chemotherapy Monitoring - Initial Assessment    Anticipated start date: 03/25/20   Regimen:  . Are orders appropriate based on the patient's diagnosis, regimen, and cycle? Yes . Does the plan date match the patient's scheduled date? Yes . Is the sequencing of drugs appropriate? Yes . Are the premedications appropriate for the patient's regimen? Yes . Prior Authorization for treatment is: Not Started o If applicable, is the correct biosimilar selected based on the patient's insurance? not applicable  Organ Function and Labs: Marland Kitchen Are dose adjustments needed based on the patient's renal function, hepatic function, or hematologic function? Yes . Are appropriate labs ordered prior to the start of patient's treatment? Yes . Other organ system assessment, if indicated: N/A . The following baseline labs, if indicated, have been ordered: N/A  Dose Assessment: . Are the drug doses appropriate? Yes . Are the following correct: o Drug concentrations Yes o IV fluid compatible with drug Yes o Administration routes Yes o Timing of therapy Yes . If applicable, does the patient have documented access for treatment and/or plans for port-a-cath placement? yes . If applicable, have lifetime cumulative doses been properly documented and assessed? not applicable Lifetime Dose Tracking  No doses have been documented on this patient for the following tracked chemicals: Doxorubicin, Epirubicin, Idarubicin, Daunorubicin, Mitoxantrone, Bleomycin, Oxaliplatin, Carboplatin, Liposomal Doxorubicin  o   Toxicity Monitoring/Prevention: . The patient has the following take home antiemetics prescribed: Prochlorperazine . The patient has the following take home medications prescribed: N/A . Medication allergies and previous infusion related reactions, if applicable, have been reviewed and addressed. Yes . The patient's current medication list has been assessed for drug-drug interactions with their  chemotherapy regimen. no significant drug-drug interactions were identified on review.  Order Review: . Are the treatment plan orders signed? No . Is the patient scheduled to see a provider prior to their treatment? Yes  I verify that I have reviewed each item in the above checklist and answered each question accordingly.  Wynona Neat 03/22/2020 11:14 AM

## 2020-03-23 ENCOUNTER — Telehealth: Payer: Self-pay | Admitting: Oncology

## 2020-03-23 ENCOUNTER — Encounter: Payer: Self-pay | Admitting: Anesthesiology

## 2020-03-23 NOTE — Telephone Encounter (Signed)
Scheduled appts per 1/20 los. Called pt to inform of appts added. Pt stated that she wanted appts cancelled because she was not feeling well, and did not feel up to doing another treatment at this time. I cancelled appts per pt's request and sent GM a message explaining everything pt told me.

## 2020-03-24 ENCOUNTER — Other Ambulatory Visit: Payer: Self-pay | Admitting: Oncology

## 2020-03-24 DIAGNOSIS — C7800 Secondary malignant neoplasm of unspecified lung: Secondary | ICD-10-CM

## 2020-03-24 DIAGNOSIS — Z7189 Other specified counseling: Secondary | ICD-10-CM

## 2020-03-24 DIAGNOSIS — Z170 Estrogen receptor positive status [ER+]: Secondary | ICD-10-CM

## 2020-03-24 DIAGNOSIS — C787 Secondary malignant neoplasm of liver and intrahepatic bile duct: Secondary | ICD-10-CM

## 2020-03-24 DIAGNOSIS — C50812 Malignant neoplasm of overlapping sites of left female breast: Secondary | ICD-10-CM

## 2020-03-24 MED ORDER — PROCHLORPERAZINE MALEATE 10 MG PO TABS: 10.0000 mg | ORAL_TABLET | Freq: Four times a day (QID) | ORAL | 1 refills | Status: AC | PRN

## 2020-03-24 NOTE — Progress Notes (Signed)
I called Brooke [name] today as she was not scheduled for a chemo or follow-up.  She did terrible with the first Zometa.  She says the pain was 10 out of 10.  She eventually took some Tylenol and that seems to have helped.  She was distraught at the beginning of her conversation but was feeling better towards the end.  She thought she was going to get Taxotere instead of Taxol and so we corrected that and discussed side effects toxicity complications of Taxol which do not include permanent alopecia.  Most patients generally do well with weekly Taxol after the first dose, which can cause a first dose effect.  We will have of course to worry about peripheral neuropathy issues.  I have double booked her to see me mid morning on Monday, 03/30/2020.  I have scheduled her for her first Taxol dose that same day.  She does not have a port.  We can easily schedule that after that first dose.  She tells me she is planning to make it for her brain MRI this Thursday, 03/26/2020.  I will call her with those results

## 2020-03-25 ENCOUNTER — Other Ambulatory Visit: Payer: Medicare PPO

## 2020-03-25 ENCOUNTER — Ambulatory Visit: Payer: Medicare PPO | Admitting: Oncology

## 2020-03-25 ENCOUNTER — Ambulatory Visit: Payer: Medicare PPO

## 2020-03-26 ENCOUNTER — Other Ambulatory Visit: Payer: Self-pay

## 2020-03-26 ENCOUNTER — Other Ambulatory Visit: Payer: Self-pay | Admitting: Oncology

## 2020-03-26 ENCOUNTER — Telehealth: Payer: Self-pay | Admitting: *Deleted

## 2020-03-26 ENCOUNTER — Ambulatory Visit (HOSPITAL_COMMUNITY)
Admission: RE | Admit: 2020-03-26 | Discharge: 2020-03-26 | Disposition: A | Discharge status: Home / Self Care | Payer: Medicare PPO | Source: Ambulatory Visit | Attending: Oncology | Admitting: Oncology

## 2020-03-26 DIAGNOSIS — Z170 Estrogen receptor positive status [ER+]: Secondary | ICD-10-CM | POA: Diagnosis not present

## 2020-03-26 DIAGNOSIS — G936 Cerebral edema: Secondary | ICD-10-CM | POA: Diagnosis not present

## 2020-03-26 DIAGNOSIS — C50812 Malignant neoplasm of overlapping sites of left female breast: Secondary | ICD-10-CM

## 2020-03-26 DIAGNOSIS — G9389 Other specified disorders of brain: Secondary | ICD-10-CM | POA: Diagnosis not present

## 2020-03-26 MED ORDER — GADOBUTROL 1 MMOL/ML IV SOLN
9.0000 mL | Freq: Once | INTRAVENOUS | Status: AC | PRN
  Administered 2020-03-26: 9 mL via INTRAVENOUS

## 2020-03-26 NOTE — Progress Notes (Signed)
Brooke [name] called this AM because of concerns re her O2 plans and chemo next week. We will stry to get her the p[ortable O2 she wants and remove the O2 tank left in her home.

## 2020-03-26 NOTE — Telephone Encounter (Signed)
This RN contacted [facility] per MD discussion with pt - she is hoping to get the portable oxygen delivered to her home and have the O2 tank removed.  She has been told by [place] of need to go to [facility] to received the portable oxygen - and she is hoping this office can assist with above so she would not have to travel at this time due to her shortness of breath.  Per contact with [facility]- per given number of [phone] x [phone] informed of protocol for evaluation by a respiratory therapist with the use of the portable O2 for appropriate settings for compensation and benefit.  Evaluation cannot be done in the home.  The facility for the testing is located at [facility] at the corner of [employer].  Pt does need to call for an appointment per [facility] to the above number.  This RN called pt and obtained

## 2020-03-26 NOTE — Progress Notes (Signed)
I called Brooke [name] twice this evening to give her the results of her brain MRI but there was no answer.  I left her a message explaining that we do not see cancer in the brain.  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  I have alerted radiation oncology regarding this and I am hoping her case can be presented Monday at conference.  It is not clear at this point whether she would benefit from adjuvant radiation or whether we should simply treat systemically.

## 2020-03-27 ENCOUNTER — Other Ambulatory Visit: Payer: Self-pay | Admitting: Radiation Therapy

## 2020-03-27 ENCOUNTER — Telehealth: Payer: Self-pay | Admitting: *Deleted

## 2020-03-27 NOTE — Telephone Encounter (Signed)
This RN called pt this AM to follow up per MD message pm yesterday with results of the MRI.  Pt verbalized feeling of reassurance per MD's call ( she was apologetic that she did not answer- phone was on silent and she had fallen asleep ).  Questions answered as well as pt is aware that she should be getting a call from [facility] for appt for benefit of radiation to areas of concern.  No further needs at this time.

## 2020-03-29 NOTE — Progress Notes (Signed)
[place]  [phone] [phone]    ID: Brooke [name] DOB: January 28, 1951  MR#: [id number]  VJK#:[id number]  Patient Care Team: [name], MD as PCP - General (Family Medicine) Rolm Bookbinder, MD as Attending Physician (Dermatology) Magrinat, Virgie Dad, MD as Consulting Physician (Oncology) Kyung Rudd, MD as Consulting Physician (Radiation Oncology) Jovita Kussmaul, MD as Consulting Physician (General Surgery) Irene Limbo, MD as Consulting Physician (Plastic Surgery) Delice Bison, Charlestine Massed, NP as Nurse Practitioner (Hematology and Oncology) [name], MD as Consulting Physician (Obstetrics and Gynecology) Laurey Morale, MD as Consulting Physician (Family Medicine) OTHER MD:   CHIEF COMPLAINT: Estrogen receptor positive breast cancer (s/p left mastectomy) now widely metastatic  CURRENT TREATMENT: Weekly paclitaxel   INTERVAL HISTORY: Brooke [name] returns today for follow-up of her estrogen receptor positive breast cancer.  She now likely has widely disseminated breast cancer.     Pathology from her liver biopsy on 1/14/202 ([id number]) confirmed metastatic carcinoma. Prognostic panel was significant for: estrogen receptor >95% positive with moderate staining intensity; progesterone receptor 0% negative; proliferation marker Ki-67 of 40%; Her2 negative by immunohistochemistry (0).  Since her last visit, she underwent brain MRI on 03/26/2020 showing: dural based metastatic disease-- parenchymal dural thickening and enhancement overlying right-greater-than-left cerebral convexities, additional 4.5 cm nodular disease along mid falx, 1 cm enhancing lesion along left cerebellum which appears dural-based or may reflect a small meningioma; inhomogeneous T1 hypointense signal within bilateral calvarium, highly suspicious for calvarial metastases; numerous small nonspecific lesions within bilateral scalp and visualized neck.  Her case was  reviewed in CNS tumor conference 03/30/2020.  Dr. Renda Rolls notes tells me: "Despite some question regarding the cerebellar lesion, it appears everything is dural based and therefore outside the blood brain barrier. We think it would be reasonable to target with systemic therapy and defer any radiation, assuming she moves forward with hormone therapy and/or chemo.  Would recommend repeating an MRI in 2 months given the findings and possibility progression and/or frank brain infiltration."   REVIEW OF SYSTEMS: Brooke [name] remains very anxious and frequently overwhelmed but at the same time she was able to get herself here today for example.  She is discussing her situation with her sister in [place] but she says her sister is in her early 1s, she cannot come and she does not expect her to come.  Brooke [name] does have friends who have helped for example to bring her some groceries.  She did not want one of the friends to come with her today however.  She is very independent and would like to handle this on her own as much as possible.  She has finally been scheduled for a portable oxygen but it will be the middle of February before she receives that.  COVID 19 VACCINATION STATUS: fully vaccinated [employer]), with booster 12/28/2019    HISTORY OF CURRENT ILLNESS: From the original intake note:  Brooke [name] herself palpated a change in her left breast and brought this to medical attention.  On 01/18/2017 she underwent bilateral diagnostic mammography with tomography and bilateral breast ultrasonography at [facility].  The breast density was category B.  At the most recent exam was from December 2016.  In the right breast upper outer quadrant there was a 1 cm irregular mass associated with punctate calcification.  By ultrasound this was not well-defined, and it was biopsied on 01/26/2017 with tomographic guidance this showed a complex sclerosing lesion with the usual ductal hyperplasia.  The right axilla was sonographically  On the  left mammography showed a 4 cm dense mass with indistinct margins in the upper inner quadrant.  Ultrasound defined a 3 cm irregular mass in the upper inner quadrant of the left breast which was palpable.  There was an abnormal lymph node in the left axilla.  Biopsy of the left mass and abnormal lymph node ([id number], on 01/26/2017) showed both to be involved by invasive ductal carcinoma, with some lobular features but E-cadherin positive, estrogen and progesterone receptors both 100% positive, with strong staining intensity, with MIB-1 between 15-20%.  There was no HER-2 amplification, the signals ratio being 1.31/1.58 and the number per cell 2.30/2.05  The patient's subsequent history is as detailed below.   PAST MEDICAL HISTORY: Past Medical History:  Diagnosis Date   Anemia    Anxiety    Arthritis    ASCUS of cervix with negative high risk HPV 12/2018   Bilateral dry eyes    Breast cancer, left breast ([place])    Family history of breast cancer 02/02/2017   Family history of breast cancer    Family history of colon cancer 02/02/2017   Family history of colon cancer    Family history of prostate cancer    Family history of prostate cancer in father 02/02/2017   Family history of uterine cancer    Fibroid    History of kidney stones    Hyperlipidemia    Hypertension    Melanoma of upper arm ([place]) 2009   RIGHT ARM    Obesity (BMI 30.0-34.9)    Osteopenia 09/2017   T score -1.9 max 8% / 0.8% statistically significant decline at right and left hip stable at the spine   PONV (postoperative nausea and vomiting)    Squamous carcinoma     of the skin   UTI (urinary tract infection)    Varicose veins of both lower extremities    Vitamin D deficiency 07/2009   LOW VITAMIN D 29   Vitamin deficiency 07/2008   VITAMIN D LOW 23    PAST SURGICAL HISTORY: Past Surgical History:  Procedure Laterality Date   ABDOMINAL SURGERY  1975   ovairan cyst    APPENDECTOMY      BREAST LUMPECTOMY WITH RADIOACTIVE SEED LOCALIZATION Right 09/25/2017   Procedure: RIGHT BREAST LUMPECTOMY WITH RADIOACTIVE SEED LOCALIZATION;  Surgeon: Jovita Kussmaul, MD;  Location: [place];  Service: General;  Laterality: Right;   BREAST SURGERY     bio   CHOLECYSTECTOMY     DERMOID CYST  EXCISION     DILATATION & CURETTAGE/HYSTEROSCOPY WITH MYOSURE N/A 04/23/2019   Procedure: DILATATION & CURETTAGE/HYSTEROSCOPY WITH MYOSURE;  Surgeon: [name], MD;  Location: [place];  Service: Gynecology;  Laterality: N/A;  request to follow at 9:30am in [facility] block requests 45 minutes   [place]     HYSTEROSCOPY     insertion  port a cath  03/02/2016   MASTECTOMY MODIFIED RADICAL Left 09/25/2017   Procedure: LEFT MODIFIED RADICAL MASTECTOMY;  Surgeon: Jovita Kussmaul, MD;  Location: [place];  Service: General;  Laterality: Left;   PORTACATH PLACEMENT N/A 03/02/2017   Procedure: INSERTION PORT-A-CATH;  Surgeon: Jovita Kussmaul, MD;  Location: [place];  Service: General;  Laterality: N/A;   SKIN BIOPSY     SKIN CANCER EXCISION  2010,2011   2010-RIGHT ARM, 2011-LEFT LOWER LEG.-DR Rolm Bookbinder DERM. DR. Sarajane Jews IS HER DERM SURGEON.  FAMILY HISTORY Family History  Problem Relation Age of Onset   Diabetes Father    Hypertension Father    Heart disease Father    Prostate cancer Father 36   Arthritis Father    Breast cancer Sister 5       again at age 55, GT results unk   Skin cancer Sister    Colon cancer Paternal Grandfather 76   Hypertension Mother    Heart failure Mother    Uterine cancer Sister 67       stage IV   Breast cancer Cousin        mat first cousin dx under 16  The patient's father was diagnosed with prostate cancer in his 69s and died at age 48 from complications of that disease.  The patient's mother died at age 70 with congestive heart failure.  The patient had no  brothers, 3 sisters.  One sister had breast cancer at age 77 and again at age 64.  She has been genetically tested and was found to be BRCA not mutated in addition a paternal grandfather had colon cancer in his 42s.  There is no history of ovarian cancer in the family to the patient's knowledge.   GYNECOLOGIC HISTORY:  Patient's last menstrual period was 03/18/1997. Menarche age 36. The patient is GX P0.  She stopped having periods in 1999 and took hormone replacement approximately 5 years.   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  She is now retired.  She is single and lives alone with no pets.    ADVANCED DIRECTIVES: Not in place   HEALTH MAINTENANCE: Social History   Tobacco Use   Smoking status: Never Smoker   Smokeless tobacco: Never Used  Vaping Use   Vaping Use: Never used  Substance Use Topics   Alcohol use: No   Drug use: No    Colonoscopy: Never  PAP: 12/2019, negative  Bone density: 2017/osteopenia   Allergies  Allergen Reactions   Neosporin [Neomycin-Bacitracin Zn-Polymyx] Swelling, Rash and Other (See Comments)    Blisters   Bactrim [Sulfamethoxazole-Trimethoprim]     Made her feel very sick   Benzalkonium Chloride Other (See Comments)    Redness (Pt is unaware)  It works by killing microorganisms and inhibiting their future growth, and for this reason frequently appears as an ingredient in antibacterial hand wipes, antiseptic creams and anti-itch ointments.   Latex Rash    Current Outpatient Medications  Medication Sig Dispense Refill   dexamethasone (DECADRON) 4 MG tablet Take 1 tablet (4 mg total) by mouth daily. 60 tablet 1   losartan (COZAAR) 25 MG tablet TAKE 1 TABLET(25 MG) BY MOUTH DAILY (Patient taking differently: Taking 1/2 tablet) 90 tablet 1   prochlorperazine (COMPAZINE) 10 MG tablet Take 1 tablet (10 mg total) by  mouth every 6 (six) hours as needed (Nausea or vomiting). 30 tablet 1   Propylene Glycol 0.6 % SOLN Place 1 drop into both eyes daily.     vitamin C (ASCORBIC ACID) 500 MG tablet Take 1,000 mg by mouth daily.      vitamin E 600 UNIT capsule Take 400 Units by mouth 2 (two) times a week.      No current facility-administered medications for this visit.    OBJECTIVE:  white woman examined in a wheelchair Vitals:   03/30/20 1005  BP: 140/61  Pulse: 99  Resp: 18  Temp: 98.8  F (37.1 C)  SpO2: 97%     Body mass index is 29.13 kg/m.   Wt Readings from Last 3 Encounters:  03/30/20 190 lb 3.2 oz (86.3 kg)  03/19/20 199 lb 4.8 oz (90.4 kg)  03/11/20 201 lb 4.8 oz (91.3 kg)  ECOG FS:1 - Symptomatic but completely ambulatory  Sclerae unicteric, EOMs intact Wearing a mask There is a subcutaneous lesion of the nape of the neck measuring approximately 1 cm, flat, slightly erythematous, not scaly, not tender.  There is also a one fourth of a centimeter subcutaneous nonerythematous nontender lesion right above the right eyebrow Lungs no rales or rhonchi Heart regular rate and rhythm Abd soft, nontender, positive bowel sounds Neuro: nonfocal, well oriented, appropriate affect Breasts: Deferred  LAB RESULTS:  CMP     Component Value Date/Time   NA 136 03/30/2020 0927   NA 141 03/03/2017 1358   K 3.9 03/30/2020 0927   K 3.3 (L) 03/03/2017 1358   CL 107 03/30/2020 0927   CO2 17 (L) 03/30/2020 0927   CO2 28 03/03/2017 1358   GLUCOSE 124 (H) 03/30/2020 0927   GLUCOSE 107 03/03/2017 1358   BUN 12 03/30/2020 0927   BUN 12.7 03/03/2017 1358   CREATININE 0.91 03/30/2020 0927   CREATININE 0.83 02/19/2020 0936   CREATININE 0.9 03/03/2017 1358   CALCIUM 8.1 (L) 03/30/2020 0927   CALCIUM 9.4 03/03/2017 1358   PROT 6.1 (L) 03/30/2020 0927   PROT 6.5 03/03/2017 1358   ALBUMIN 3.2 (L) 03/30/2020 0927   ALBUMIN 3.7 03/03/2017 1358   AST 148 (H) 03/30/2020 0927   AST 43 (H) 09/12/2018 1354    AST 23 03/03/2017 1358   ALT 49 (H) 03/30/2020 0927   ALT 84 (H) 09/12/2018 1354   ALT 25 03/03/2017 1358   ALKPHOS 359 (H) 03/30/2020 0927   ALKPHOS 71 03/03/2017 1358   BILITOT 1.4 (H) 03/30/2020 0927   BILITOT 0.6 09/12/2018 1354   BILITOT 0.52 03/03/2017 1358   GFRNONAA >60 03/30/2020 0927   GFRNONAA >60 09/12/2018 1354   GFRAA >60 06/12/2019 1401   GFRAA >60 09/12/2018 1354    No results found for: Dorene Ar, A1GS, A2GS, BETS, BETA2SER, GAMS, MSPIKE, SPEI  No results found for: Ron Parker, [employer]  Lab Results  Component Value Date   WBC 9.2 03/30/2020   NEUTROABS 7.1 03/30/2020   HGB 12.4 03/30/2020   HCT 38.1 03/30/2020   MCV 86.0 03/30/2020   PLT 188 03/30/2020      Chemistry      Component Value Date/Time   NA 136 03/30/2020 0927   NA 141 03/03/2017 1358   K 3.9 03/30/2020 0927   K 3.3 (L) 03/03/2017 1358   CL 107 03/30/2020 0927   CO2 17 (L) 03/30/2020 0927   CO2 28 03/03/2017 1358   BUN 12 03/30/2020 0927   BUN 12.7 03/03/2017 1358   CREATININE 0.91 03/30/2020 0927   CREATININE 0.83 02/19/2020 0936   CREATININE 0.9 03/03/2017 1358      Component Value Date/Time   CALCIUM 8.1 (L) 03/30/2020 0927   CALCIUM 9.4 03/03/2017 1358   ALKPHOS 359 (H) 03/30/2020 0927   ALKPHOS 71 03/03/2017 1358   AST 148 (H) 03/30/2020 0927   AST 43 (H) 09/12/2018 1354   AST 23 03/03/2017 1358   ALT 49 (H) 03/30/2020 0927   ALT 84 (H) 09/12/2018 1354   ALT 25 03/03/2017 1358   BILITOT 1.4 (H) 03/30/2020 0927   BILITOT 0.6 09/12/2018 1354  BILITOT 0.52 03/03/2017 1358       No results found for: LABCA2  No components found for: AGTXMI680  No results for input(s): INR in the last 168 hours.  No results found for: LABCA2  Lab Results  Component Value Date   HOZ224 32 03/06/2020    No results found for: MGN003  No results found for: BCW888  Lab Results  Component Value Date   CA2729 983.0 (H) 03/06/2020    No  components found for: HGQUANT  Lab Results  Component Value Date   CEA1 744.17 (H) 03/06/2020   /  CEA ([facility])  Date Value Ref Range Status  03/06/2020 744.17 (H) 0.00 - 5.00 ng/mL Final    Comment:    (NOTE) This test was performed using [employer]'s Chemiluminescent Microparticle Immunoassay. Values obtained from different assay methods cannot be used interchangeably. Please note that 5-10% of patients who smoke may see CEA levels up to 6.9 ng/mL. Performed at [facility], [place] [address] [place], [place] [address]      No results found for: AFPTUMOR  No results found for: CHROMOGRNA  No results found for: HGBA, HGBA2QUANT, HGBFQUANT, HGBSQUAN (Hemoglobinopathy evaluation)   No results found for: LDH  Lab Results  Component Value Date   IRON 52 02/19/2020   TIBC 327 02/19/2020   IRONPCTSAT 16 02/19/2020   (Iron and TIBC)  Lab Results  Component Value Date   FERRITIN 298 (H) 02/19/2020    Urinalysis    Component Value Date/Time   COLORURINE DARK YELLOW 02/19/2020 0947   APPEARANCEUR CLOUDY (A) 02/19/2020 0947   LABSPEC 1.018 02/19/2020 0947   PHURINE 6.0 02/19/2020 0947   GLUCOSEU NEGATIVE 02/19/2020 0947   GLUCOSEU NEGATIVE 11/05/2012 1559   HGBUR 1+ (A) 02/19/2020 0947   BILIRUBINUR neg 08/10/2018 1529   KETONESUR 2+ (A) 02/19/2020 0947   PROTEINUR 2+ (A) 02/19/2020 0947   UROBILINOGEN 0.2 08/10/2018 1529   UROBILINOGEN 0.2 11/05/2012 1559   NITRITE neg 08/10/2018 1529   NITRITE NEGATIVE 05/29/2015 1113   LEUKOCYTESUR Negative 08/10/2018 1529    STUDIES: MR Brain W Wo Contrast  Result Date: 03/26/2020 CLINICAL DATA:  Malignant neoplasm of overlapping sites of left breast in female, estrogen receptor positive. Breast cancer, staging; evaluate for brain metastases or meningeal spread. EXAM: MRI HEAD WITHOUT AND WITH CONTRAST TECHNIQUE: Multiplanar, multiecho pulse sequences of the brain and surrounding structures were  obtained without and with intravenous contrast. CONTRAST:  31m GADAVIST GADOBUTROL 1 MMOL/ML IV SOLN COMPARISON:  Head CT 03/05/2020. FINDINGS: Brain: Mild intermittent motion degradation. Cerebral volume is normal for age. There is pachymeningeal dural thickening and enhancement overlying the right greater than left cerebral convexities. This measures up to 8 mm in thickness overlying the right cerebral convexity (series 17, image 23). Findings are compatible with dural-based metastatic disease. Additionally, there is nodular dural based metastatic disease along the mid falx measuring 4.5 x 1.0 cm (AP X TV). 1 cm enhancing lesion along the left cerebellum which appears dural based (series 16, image 37). Mild surrounding parenchymal edema. No other parenchymal lesions are identified. No significant cerebral white matter disease. Small chronic infarct within the right cerebellum. There is no acute infarct. No chronic intracranial blood products. No extra-axial fluid collection. No midline shift. Vascular: Expected proximal arterial flow voids. Skull and upper cervical spine: Inhomogeneous T1 hypointense signal throughout the calvarium. Sinuses/Orbits: Visualized orbits show no acute finding. Trace ethmoid and right maxillary sinus mucosal thickening. Other: Numerous nonspecific small  lesions within the bilateral scalp and visualized neck. These results will be called to the ordering clinician or representative by the Radiologist Assistant, and communication documented in the PACS or [employer]. IMPRESSION: Pachymeningeal dural thickening and enhancement overlying the right greater than left cerebral convexities measuring up to 8 mm in thickness. Findings are compatible with dural-based metastatic disease. Additional nodular dural-based metastatic disease along the mid falx measuring 4.5 x 1.0 cm (AP x TV). 1 cm enhancing lesion along the left cerebellum which appears dural-based. This may reflect additional  dural-based metastatic disease or a small meningioma. Mild parenchymal edema within the underlying left cerebellum. Inhomogeneous T1 hypointense signal within the bilateral calvarium, highly suspicious for calvarial metastases. Numerous small nonspecific lesions within the bilateral scalp and visualized neck. Direct visualization recommended. Small chronic lacunar infarct within the right cerebellar hemisphere. Electronically Signed   By: Kellie Simmering DO   On: 03/26/2020 14:52   NM PET Image Initial (PI) Skull Base To Thigh  Result Date: 03/05/2020 CLINICAL DATA:  Initial treatment strategy for breast cancer in a 70 year old female recently found to have numerous pulmonary nodules and abnormalities in the liver and LEFT adrenal. EXAM: NUCLEAR MEDICINE PET SKULL BASE TO THIGH TECHNIQUE: 10.46 mCi F-18 FDG was injected intravenously. Full-ring PET imaging was performed from the skull base to thigh after the radiotracer. CT data was obtained and used for attenuation correction and anatomic localization. Fasting blood glucose: 147 mg/dl COMPARISON:  CT of the chest of February 27, 2020 FINDINGS: Mediastinal blood pool activity: SUV max 2.44 Liver activity: SUV max NA NECK: Numerous small lymph nodes and lesions with increased metabolism throughout the neck. Two small adjacent 3-4 mm nodules on image 5 of series 4 in the subcutaneous fat with a maximum SUV ofw 4.4 Intraparotid lesion in the RIGHT parotid gland measuring 6 mm (image 15, series 4) maximum SUV of 5.5 RIGHT level II lymph node on image 20 of series 4 measures 8 mm to 9 mm with a maximum SUV of 6.2 subcutaneous nodules scattered throughout the subcutaneous fat of the posterior neck with another lesion seen on image 22 of series 4 less than a cm size but with increased metabolic activity similar to other areas in the neck. Subcentimeter lymph node behind the RIGHT sternocleidomastoid on image 38 of series 4 maximum SUV of 5.0. Incidental CT findings: Tiny  areas of more focal heterogeneous uptake noted in the brain in the cerebellum though there is generalized heterogeneity is not definitive for disease but suspicious given the extensive nature of disease. The CT images show no gross abnormality within the visualized brain. CHEST: Multiple areas of increased metabolic activity throughout the chest involving lymph nodes, pulmonary nodules and bones. (Image 74, series 3) subcarinal lymph node 11 mm short axis with a maximum SUV of approximately 22. Similarly hypermetabolic lymph nodes throughout the mediastinum tracking towards the thoracic inlet. (Image 65, series 4) 16 mm RIGHT paratracheal lymph node with a max SUV of 16.8. Anterior mediastinal AP window and RIGHT hilar lymph nodes show increase in metabolic activity as well. Pulmonary nodule along the RIGHT heart border (image 87, series 4) 8 mm size with a maximum SUV of 3.8. Millimetric pulmonary nodules in the bilateral chest grossly unchanged compared to the recent chest CT without significant metabolic activity as measured on the PET evaluation. For, faintly hypermetabolic focus corresponding to a small nodule on image 22 of series 8 in the RIGHT upper lobe adjacent to bronchovascular structures with mildly increased  metabolism. LEFT hilar structures also with mildly increased metabolism though not with the same level that is seen in the subcarinal lymph nodes or lymph nodes elsewhere in the chest. Incidental CT findings: Normal heart size without pericardial effusion. Scattered atheromatous plaque in the thoracic aorta. Signs of LEFT mastectomy and axillary dissection. ABDOMEN/PELVIS: Areas of heterogeneity seen in the liver on the chest CT display marked increased metabolism. Faint area of low attenuation in the central liver near the IVC confluence biased towards the RIGHT hepatic lobe on image 103 of series 4 is hypermetabolic measuring approximately 4.8 x 4.2 cm with a maximum SUV of 16.7. Heterogeneity in  the LEFT hepatic lobe showing predominantly low attenuation measuring approximately 4.8 cm greatest axial dimension on image 111 of series 4 with a maximum SUV approximately 12.9. Large lesion in the inferior RIGHT hepatic lobe on image 130 of series 4 with a maximum SUV of 13.2 and size of approximately 4.2 cm. Numerous additional foci of increased metabolism throughout both the RIGHT and LEFT hepatic lobe compatible with diffuse hepatic metastatic disease. LEFT adrenal lesion seen on the recent CT is hypermetabolic. There is also a pancreatic mass with increased metabolism. Pancreatic mass measuring approximately 3.5 x 2.9 cm on image 110 of series 4 with a maximum SUV of 16. Similarly hypermetabolic LEFT adrenal lesion on image 110 of series 4. There is peripheral pancreatic atrophy associated with the pancreatic lesion. Increased metabolic activity seen in the head of the pancreas and neck of the pancreas on images 128 and 121 of series 4 without corresponding CT abnormality though some fullness in the pancreatic head. LEFT adrenal with small nodule showing increased metabolism on image 110 of series 4. Small peritoneal nodules are noted with increased metabolism, for instance on image 114 of series 4, an 11 mm peritoneal nodule is noted with innumerable foci of nodularity scattered throughout the upper abdomen and omentum, also within the retroperitoneum showing areas of increased metabolic uptake. The lesion in the LEFT upper quadrant shows an SUV of 5.6. Slightly larger 1.5 cm nodule along the LEFT paracolic gutter with a maximum SUV of 9.5 on image 125 of series 4 Precaval lymph node on image 143 of series 4 with a maximum SUV of 7 short axis dimension of 7 mm. Scattered smaller lymph nodes without the same level of metabolic activity. Lymph node or nodule in the LEFT mesorectum on image 173 of series 4 also with increased metabolic activity measuring 9.8 mm. Incidental CT findings: Calcifications in liver  and spleen compatible with prior granulomatous disease. Post cholecystectomy. Staghorn calculus in the lower pole of the RIGHT kidney measures approximately 2.3 x 1.9 cm. No perinephric stranding. No hydronephrosis. Urinary bladder is collapsed. No acute gastrointestinal process. Calcified atheromatous plaque in the abdominal aorta without aneurysmal dilation. Small focus of increased metabolic activity within a fibroid in the central portion with a maximum SUV of 6.7 on image 175 of series 4. No visible CT correlate within the fibroid. This area measures approximately 1-1.5 cm within a 5 x 4.9 cm leiomyoma. SKELETON: Innumerable foci of increased metabolic activity throughout the axial and appendicular skeleton involving all visualized bones. And every spinal level with variable degrees of spinal involvement, some levels showing nearly the entire vertebral body with hypermetabolic features, other levels with posterior element involvement or mix of vertebral body and posterior element involvement. This also likely includes the skull base at the atlantoaxial joint Numerous lesions in the bilateral proximal femora with near complete replacement  of the LEFT and RIGHT femoral neck with metabolic activity LEFT greater than RIGHT. (Image 76, series 4) stone with metabolic activity approximately 13.1 SUV. Sternal manubrium on image 57 with a maximum SUV of 12.3. C7-T1 with a maximum SUV of approximately 10.1. LEFT proximal femur with a maximum SUV of 11.4. this area is associated with more pronounced periosteal activity and cortical irregularity seen along the anterior surface of the femur, femoral neck on image 182 of series 4. Most of the lesions are nearly radiographically occult with subtle foci of increased density within the marrow space of the RIGHT and LEFT proximal femur and RIGHT and LEFT proximal humerus. Heterogeneity at the T3 level is radiographically apparent as well, subtle variable density within the ribs.  Numerous foci of subcutaneous nodularity within the subcutaneous fat, example of this process seen along the RIGHT flank and previously described subcutaneous nodules in the neck. RIGHT flank lesion on image 97 of series 4 measuring 17 mm with a maximum SUV of 6.9 Small intramuscular lesion on image 121 of series 4 within the RIGHT erector spinae muscle less than a cm with scattered foci either within the subcutaneous fat or body wall musculature scattered throughout, most lesions appear to be within the subcutaneous fat. Incidental CT findings: none IMPRESSION: 1. Widespread metastatic disease to the chest, abdomen, and pelvis. Metastatic disease from breast cancer is considered though with pancreatic mass showing peripheral pancreatic atrophy widespread disease from pancreatic adenocarcinoma is another differential consideration. 2. Chest involvement and neck involvement with lymph nodes and pulmonary nodules. 3. Solid organ involvement in the abdomen includes the liver, pancreas and bilateral adrenal glands with signs of diffuse peritoneal nodularity and retroperitoneal nodularity as well as retroperitoneal nodal involvement. 4. Musculoskeletal involvement includes widespread bony metastatic disease involving all visualized bones and potentially the atlantooccipital joint in addition to the entire spine. Most of these areas are nearly radiographically occult with some irregularity of the LEFT proximal femur along the anterior margin potentially related to underlying degenerative changes about the hip though given the degree of involvement suggested on PET the patient is at risk for pathologic fracture. 5. Heterogeneity of the visualized brain on PET imaging is nonspecific but suspicious given other findings. Consider dedicated imaging of the brain with MRI with and without contrast. 6. Numerous subcutaneous nodules. The largest of which along the RIGHT flank could be amenable to tissue sampling utilizing  ultrasound to establish diagnosis. 7. Nonspecific areas of uptake within the RIGHT parotid and within a leiomyoma in the uterus, these areas may represent metastatic disease, more likely given the diffuse nature of disease seen on today's scan. 8. Staghorn calculus in the RIGHT kidney 9. Aortic atherosclerosis. 10. Signs of LEFT mastectomy and axillary dissection. These results will be called to the ordering clinician or representative by the Radiologist Assistant, and communication documented in the PACS or [employer]. Aortic Atherosclerosis (ICD10-I70.0). Electronically Signed   By: Zetta Bills M.D.   On: 03/05/2020 11:17   [place] CORE BIOPSY (LIVER)  Result Date: 03/13/2020 INDICATION: 70 year old female with hepatic metastatic lesions of uncertain primary. EXAM: ULTRASOUND GUIDED LIVER LESION BIOPSY COMPARISON:  None. MEDICATIONS: None ANESTHESIA/SEDATION: Fentanyl 50 mcg IV; Versed 1 mg IV Total Moderate Sedation time:  20 minutes. The patient's level of consciousness and vital signs were monitored continuously by radiology nursing throughout the procedure under my direct supervision. COMPLICATIONS: None immediate. PROCEDURE: Informed written consent was obtained from the patient after a discussion of the risks, benefits and alternatives to  treatment. The patient understands and consents the procedure. A timeout was performed prior to the initiation of the procedure. Ultrasound scanning was performed of the right upper abdominal quadrant demonstrates heterogeneously hypoechoic rounded mass in the left lobe measuring up to approximately 2.0 cm. The left lobe mass was selected for biopsy and the procedure was planned. The subxiphoid abdominal area was prepped and draped in the usual sterile fashion. The overlying soft tissues were anesthetized with 1% lidocaine with epinephrine. A 17 gauge, 6.8 cm co-axial needle was advanced into a peripheral aspect of the lesion. This was followed by 3 core biopsies  with an 18 gauge core device under direct ultrasound guidance. The coaxial needle tract was embolized with a small amount of Gel-Foam slurry and superficial hemostasis was obtained with manual compression. Post procedural scanning was negative for definitive area of hemorrhage or additional complication. A dressing was placed. The patient tolerated the procedure well without immediate post procedural complication. IMPRESSION: Technically successful ultrasound guided core needle biopsy of left lobe hepatic mass, presumed metastasis. Ruthann Cancer, MD [facility] Electronically Signed   By: Ruthann Cancer MD   On: 03/13/2020 17:05     ELIGIBLE FOR AVAILABLE RESEARCH PROTOCOL: no   ASSESSMENT: 70 y.o. [place] woman status post bilateral biopsies 01/26/2017, showing  (1) in the right breast, a complex sclerosing lesion, status post lumpectomy 09/25/2017, with no malignancy identified.  (2) in the left breast, a cT2 pN1 invasive ductal carcinoma (with some lobular features but E-cadherin positive), grade 2, estrogen and progesterone receptor positive, HER-2 not amplified, with an MIB-1 of 15-20%  (a) breast MRI 02/04/2017 suggests a T3 N1 tumor  (3) started neoadjuvant cyclophosphamide/docetaxel 03/03/2017, discontinued after 1 cycle with very poor tolerance  (a) started cyclophosphamide/methotrexate/fluorouracil (CMF) 03/28/2017, repeated x7 cycles, last dose 08/01/2017  (4) status post left modified radical mastectomy 09/25/17 for a ypT5 ypN2, stage IIIA invasive ductal carcinoma, grade 2, again estrogen and progesterone receptor positive, HER-2 not amplified  (5) adjuvant radiation completed 12/21/2017  (a) capecitabine sensitization tolerated only the initial week of radiation (b) Site/dose: The patient initially received a dose of 50.4 Gy in 28 fractions to the left chest wall and left supraclavicular region. This was delivered using a  3-D conformal, 4 field technique. The patient then received a boost to the mastectomy scar. This delivered an additional 10 Gy in 5 fractions using an en face electron field. The total dose was 60.4 Gy.  ( 6) tamoxifen started 03/09/2018, discontinued January 2022 with metastatic progression  (7) genetics testing 12/12/2017 through the Multi-Gene Panel offered by [facility] found no deleterious mutations in AIP, ALK, APC, ATM, AXIN2,BAP1,  BARD1, BLM, BMPR1A, BRCA1, BRCA2, BRIP1, CASR, CDC73, CDH1, CDK4, CDKN1B, CDKN1C, CDKN2A (p14ARF), CDKN2A (p16INK4a), CEBPA, CHEK2, CTNNA1, DICER1, DIS3L2, EGFR (c.2369C>T, p.Thr790Met variant only), EPCAM (Deletion/duplication testing only), FH, FLCN, GATA2, GPC3, GREM1 (Promoter region deletion/duplication testing only), HOXB13 (c.251G>A, p.Gly84Glu), HRAS, KIT, MAX, MEN1, MET, MITF (c.952G>A, p.Glu318Lys variant only), MLH1, MSH2, MSH3, MSH6, MUTYH, NBN, NF1, NF2, NTHL1, PALB2, PDGFRA, PHOX2B, PMS2, POLD1, POLE, POT1, PRKAR1A, PTCH1, PTEN, RAD50, RAD51C, RAD51D, RB1, RECQL4, RET, RUNX1, SDHAF2, SDHA (sequence changes only), SDHB, SDHC, SDHD, SMAD4, SMARCA4, SMARCB1, SMARCE1, STK11, SUFU, TERC, TERT, TMEM127, TP53, TSC1, TSC2, VHL, WRN and WT1.   (8) Foundation 1 testing shows stable microsatellite status and 1 mutation/ Mb, TP53 mutations x2  (a) PD-L1 dated July 2019 negative  METASTATIC DISEASE: January 2022 (9) CT angio of the chest 02/27/2020 shows  multiple hilar nodes consistent with prior granulomatous infection, multiple small pulmonary nodules, multiple punctate granulomas throughout the liver and spleen, nodular hepatic contour and areas of mottling possibly due to steatosis, 1.6 cm left adrenal nodule  (a) PET scan 03/05/2020 shows mildly to highly hypermetabolic adenopathy, the pulmonary nodules being mildly to not hypermetabolic, a large right hepatic liver lesion with an SUV of 13.2 and additional hypermetabolic right and left lobe foci, the adrenal lesion  being hypermetabolic with an SUV of 16.  There are also peritoneal nodules and innumerable foci of bony involvement.  Incidental note was made of small heterogeneous foci of increased uptake in the cerebellum.  The CT images did not show gross abnormality in the visualized brain  (b) on 03/09/2020 CA 27-29 was 983.0, CEA 744.17, the CA 19-9 normal at 32  (c) liver biopsy 03/13/2020 confirms metastati carcinoma, estrogen receptor positive, progesterone and HER 2 negative, with an Mib-1 of 40%  (d) brain MRI 03/26/2020 shows dural involvement including a 1.0 cm dural-based cerebellar lesion  (10) paclitaxel to start 03/30/2020, repeated weekly  (11) zoledronate started 03/19/2020, to be repeated every 12 weeks   PLAN: Brooke [name] will start her paclitaxel today.  She has a good understanding of the possible toxicities side effects and complications.  She will take Compazine tonight and tomorrow morning and then as needed after that.  I am going to set her up for fluids 2 or 3 days from now just to make sure she has a little bit of support midweek.  She will then see me again next week for treatment.  At this point she does not think she wants a port.  She has discussed this with Dr. Marlou Starks and if she changes her mind we will try to get that in for her as soon as possible.  I am hoping that after 2 or 3 doses we will see some response in the easily palpable subcutaneous lesions noted on physical exam today.  She knows to call for any other issue that may develop before the next visit  Total encounter time 25 minutes.*  Magrinat, Virgie Dad, MD  03/30/20 10:25 AM Medical Oncology and Hematology [facility] [place], [place] [address] Tel. [phone]    Fax. [phone]   I, Wilburn Mylar, am acting as scribe for Dr. Virgie Dad. Magrinat.  I, Lurline Del MD, have reviewed the above documentation for accuracy and completeness, and I agree with the  above.   *Total Encounter Time as defined by the Centers for Medicare and Medicaid Services includes, in addition to the face-to-face time of a patient visit (documented in the note above) non-face-to-face time: obtaining and reviewing outside history, ordering and reviewing medications, tests or procedures, care coordination (communications with other health care professionals or caregivers) and documentation in the medical record.

## 2020-03-30 ENCOUNTER — Other Ambulatory Visit: Payer: Self-pay

## 2020-03-30 ENCOUNTER — Inpatient Hospital Stay: Payer: Medicare PPO

## 2020-03-30 ENCOUNTER — Inpatient Hospital Stay: Payer: Medicare PPO | Admitting: Oncology

## 2020-03-30 ENCOUNTER — Encounter: Payer: Self-pay | Admitting: *Deleted

## 2020-03-30 VITALS — BP 140/61 | HR 99 | Temp 98.8°F | Resp 18 | Ht 67.75 in | Wt 190.2 lb

## 2020-03-30 VITALS — BP 118/64 | HR 80 | Temp 98.1°F | Resp 18

## 2020-03-30 DIAGNOSIS — D0361 Melanoma in situ of right upper limb, including shoulder: Secondary | ICD-10-CM

## 2020-03-30 DIAGNOSIS — Z9221 Personal history of antineoplastic chemotherapy: Secondary | ICD-10-CM | POA: Diagnosis not present

## 2020-03-30 DIAGNOSIS — Z170 Estrogen receptor positive status [ER+]: Secondary | ICD-10-CM | POA: Diagnosis not present

## 2020-03-30 DIAGNOSIS — C50812 Malignant neoplasm of overlapping sites of left female breast: Secondary | ICD-10-CM

## 2020-03-30 DIAGNOSIS — Z9012 Acquired absence of left breast and nipple: Secondary | ICD-10-CM | POA: Diagnosis not present

## 2020-03-30 DIAGNOSIS — Z923 Personal history of irradiation: Secondary | ICD-10-CM | POA: Diagnosis not present

## 2020-03-30 DIAGNOSIS — Z5111 Encounter for antineoplastic chemotherapy: Secondary | ICD-10-CM | POA: Diagnosis not present

## 2020-03-30 DIAGNOSIS — Z7189 Other specified counseling: Secondary | ICD-10-CM

## 2020-03-30 DIAGNOSIS — C787 Secondary malignant neoplasm of liver and intrahepatic bile duct: Secondary | ICD-10-CM

## 2020-03-30 DIAGNOSIS — C7800 Secondary malignant neoplasm of unspecified lung: Secondary | ICD-10-CM

## 2020-03-30 DIAGNOSIS — C50912 Malignant neoplasm of unspecified site of left female breast: Secondary | ICD-10-CM | POA: Diagnosis not present

## 2020-03-30 DIAGNOSIS — C7951 Secondary malignant neoplasm of bone: Secondary | ICD-10-CM | POA: Diagnosis not present

## 2020-03-30 LAB — COMPREHENSIVE METABOLIC PANEL
ALT: 49 U/L — ABNORMAL HIGH (ref 0–44)
AST: 148 U/L — ABNORMAL HIGH (ref 15–41)
Albumin: 3.2 g/dL — ABNORMAL LOW (ref 3.5–5.0)
Alkaline Phosphatase: 359 U/L — ABNORMAL HIGH (ref 38–126)
Anion gap: 12 (ref 5–15)
BUN: 12 mg/dL (ref 8–23)
CO2: 17 mmol/L — ABNORMAL LOW (ref 22–32)
Calcium: 8.1 mg/dL — ABNORMAL LOW (ref 8.9–10.3)
Chloride: 107 mmol/L (ref 98–111)
Creatinine, Ser: 0.91 mg/dL (ref 0.44–1.00)
GFR, Estimated: >60 mL/min (ref >60)
Glucose, Bld: 124 mg/dL — ABNORMAL HIGH (ref 70–99)
Potassium: 3.9 mmol/L (ref 3.5–5.1)
Sodium: 136 mmol/L (ref 135–145)
Total Bilirubin: 1.4 mg/dL — ABNORMAL HIGH (ref 0.3–1.2)
Total Protein: 6.1 g/dL — ABNORMAL LOW (ref 6.5–8.1)

## 2020-03-30 LAB — CBC WITH DIFFERENTIAL/PLATELET
Abs Immature Granulocytes: 0.37 10*3/uL — ABNORMAL HIGH (ref 0.00–0.07)
Basophils Absolute: 0 10*3/uL (ref 0.0–0.1)
Basophils Relative: 0 %
Eosinophils Absolute: 0.1 10*3/uL (ref 0.0–0.5)
Eosinophils Relative: 1 %
HCT: 38.1 % (ref 36.0–46.0)
Hemoglobin: 12.4 g/dL (ref 12.0–15.0)
Immature Granulocytes: 4 %
Lymphocytes Relative: 11 %
Lymphs Abs: 1 10*3/uL (ref 0.7–4.0)
MCH: 28 pg (ref 26.0–34.0)
MCHC: 32.5 g/dL (ref 30.0–36.0)
MCV: 86 fL (ref 80.0–100.0)
Monocytes Absolute: 0.6 10*3/uL (ref 0.1–1.0)
Monocytes Relative: 7 %
Neutro Abs: 7.1 10*3/uL (ref 1.7–7.7)
Neutrophils Relative %: 77 %
Platelets: 188 10*3/uL (ref 150–400)
RBC: 4.43 MIL/uL (ref 3.87–5.11)
RDW: 19.7 % — ABNORMAL HIGH (ref 11.5–15.5)
WBC: 9.2 10*3/uL (ref 4.0–10.5)
nRBC: 1.3 % — ABNORMAL HIGH (ref 0.0–0.2)

## 2020-03-30 MED ORDER — DIPHENHYDRAMINE HCL 50 MG/ML IJ SOLN
INTRAMUSCULAR | Status: AC
  Filled 2020-03-30: qty 1

## 2020-03-30 MED ORDER — DIPHENHYDRAMINE HCL 50 MG/ML IJ SOLN
25.0000 mg | Freq: Once | INTRAMUSCULAR | Status: AC
  Administered 2020-03-30: 25 mg via INTRAVENOUS

## 2020-03-30 MED ORDER — FAMOTIDINE IN NACL 20-0.9 MG/50ML-% IV SOLN
INTRAVENOUS | Status: AC
  Filled 2020-03-30: qty 50

## 2020-03-30 MED ORDER — SODIUM CHLORIDE 0.9 % IV SOLN
Freq: Once | INTRAVENOUS | Status: AC
  Filled 2020-03-30: qty 250

## 2020-03-30 MED ORDER — FAMOTIDINE IN NACL 20-0.9 MG/50ML-% IV SOLN
20.0000 mg | Freq: Once | INTRAVENOUS | Status: AC
  Administered 2020-03-30: 20 mg via INTRAVENOUS

## 2020-03-30 MED ORDER — SODIUM CHLORIDE 0.9 % IV SOLN
10.0000 mg | Freq: Once | INTRAVENOUS | Status: AC
  Administered 2020-03-30: 10 mg via INTRAVENOUS
  Filled 2020-03-30: qty 10

## 2020-03-30 MED ORDER — SODIUM CHLORIDE 0.9 % IV SOLN
80.0000 mg/m2 | Freq: Once | INTRAVENOUS | Status: AC
  Administered 2020-03-30: 168 mg via INTRAVENOUS
  Filled 2020-03-30: qty 28

## 2020-03-30 NOTE — Patient Instructions (Signed)
[facility] Discharge Instructions for Patients Receiving Chemotherapy  Today you received the following chemotherapy agents: paclitaxel.  To help prevent nausea and vomiting after your treatment, we encourage you to take your nausea medication as directed.   If you develop nausea and vomiting that is not controlled by your nausea medication, call the clinic.   BELOW ARE SYMPTOMS THAT SHOULD BE REPORTED IMMEDIATELY:  *FEVER GREATER THAN 100.5 F  *CHILLS WITH OR WITHOUT FEVER  NAUSEA AND VOMITING THAT IS NOT CONTROLLED WITH YOUR NAUSEA MEDICATION  *UNUSUAL SHORTNESS OF BREATH  *UNUSUAL BRUISING OR BLEEDING  TENDERNESS IN MOUTH AND THROAT WITH OR WITHOUT PRESENCE OF ULCERS  *URINARY PROBLEMS  *BOWEL PROBLEMS  UNUSUAL RASH Items with * indicate a potential emergency and should be followed up as soon as possible.  Feel free to call the clinic should you have any questions or concerns. The clinic phone number is [phone].  Please show the [place] at check-in to the Emergency Department and triage nurse.  Paclitaxel injection What is this medicine? PACLITAXEL (PAK li TAX el) is a chemotherapy drug. It targets fast dividing cells, like cancer cells, and causes these cells to die. This medicine is used to treat ovarian cancer, breast cancer, lung cancer, Kaposi's sarcoma, and other cancers. This medicine may be used for other purposes; ask your health care provider or pharmacist if you have questions. COMMON BRAND NAME(S): Onxol, Taxol What should I tell my health care provider before I take this medicine? They need to know if you have any of these conditions:  history of irregular heartbeat  liver disease  low blood counts, like low white cell, platelet, or red cell counts  lung or breathing disease, like asthma  tingling of the fingers or toes, or other nerve disorder  an unusual or allergic reaction to paclitaxel, alcohol, polyoxyethylated  castor oil, other chemotherapy, other medicines, foods, dyes, or preservatives  pregnant or trying to get pregnant  breast-feeding How should I use this medicine? This drug is given as an infusion into a vein. It is administered in a hospital or clinic by a specially trained health care professional. Talk to your pediatrician regarding the use of this medicine in children. Special care may be needed. Overdosage: If you think you have taken too much of this medicine contact a poison control center or emergency room at once. NOTE: This medicine is only for you. Do not share this medicine with others. What if I miss a dose? It is important not to miss your dose. Call your doctor or health care professional if you are unable to keep an appointment. What may interact with this medicine? Do not take this medicine with any of the following medications:  live virus vaccines This medicine may also interact with the following medications:  antiviral medicines for hepatitis, HIV or AIDS  certain antibiotics like erythromycin and clarithromycin  certain medicines for fungal infections like ketoconazole and itraconazole  certain medicines for seizures like carbamazepine, phenobarbital, phenytoin  gemfibrozil  nefazodone  rifampin  St. John's wort This list may not describe all possible interactions. Give your health care provider a list of all the medicines, herbs, non-prescription drugs, or dietary supplements you use. Also tell them if you smoke, drink alcohol, or use illegal drugs. Some items may interact with your medicine. What should I watch for while using this medicine? Your condition will be monitored carefully while you are receiving this medicine. You will need important blood work  done while you are taking this medicine. This medicine can cause serious allergic reactions. To reduce your risk you will need to take other medicine(s) before treatment with this medicine. If you  experience allergic reactions like skin rash, itching or hives, swelling of the face, lips, or tongue, tell your doctor or health care professional right away. In some cases, you may be given additional medicines to help with side effects. Follow all directions for their use. This drug may make you feel generally unwell. This is not uncommon, as chemotherapy can affect healthy cells as well as cancer cells. Report any side effects. Continue your course of treatment even though you feel ill unless your doctor tells you to stop. Call your doctor or health care professional for advice if you get a fever, chills or sore throat, or other symptoms of a cold or flu. Do not treat yourself. This drug decreases your body's ability to fight infections. Try to avoid being around people who are sick. This medicine may increase your risk to bruise or bleed. Call your doctor or health care professional if you notice any unusual bleeding. Be careful brushing and flossing your teeth or using a toothpick because you may get an infection or bleed more easily. If you have any dental work done, tell your dentist you are receiving this medicine. Avoid taking products that contain aspirin, acetaminophen, ibuprofen, naproxen, or ketoprofen unless instructed by your doctor. These medicines may hide a fever. Do not become pregnant while taking this medicine. Women should inform their doctor if they wish to become pregnant or think they might be pregnant. There is a potential for serious side effects to an unborn child. Talk to your health care professional or pharmacist for more information. Do not breast-feed an infant while taking this medicine. Men are advised not to father a child while receiving this medicine. This product may contain alcohol. Ask your pharmacist or healthcare provider if this medicine contains alcohol. Be sure to tell all healthcare providers you are taking this medicine. Certain medicines, like metronidazole  and disulfiram, can cause an unpleasant reaction when taken with alcohol. The reaction includes flushing, headache, nausea, vomiting, sweating, and increased thirst. The reaction can last from 30 minutes to several hours. What side effects may I notice from receiving this medicine? Side effects that you should report to your doctor or health care professional as soon as possible:  allergic reactions like skin rash, itching or hives, swelling of the face, lips, or tongue  breathing problems  changes in vision  fast, irregular heartbeat  high or low blood pressure  mouth sores  pain, tingling, numbness in the hands or feet  signs of decreased platelets or bleeding - bruising, pinpoint red spots on the skin, black, tarry stools, blood in the urine  signs of decreased red blood cells - unusually weak or tired, feeling faint or lightheaded, falls  signs of infection - fever or chills, cough, sore throat, pain or difficulty passing urine  signs and symptoms of liver injury like dark yellow or brown urine; general ill feeling or flu-like symptoms; light-colored stools; loss of appetite; nausea; right upper belly pain; unusually weak or tired; yellowing of the eyes or skin  swelling of the ankles, feet, hands  unusually slow heartbeat Side effects that usually do not require medical attention (report to your doctor or health care professional if they continue or are bothersome):  diarrhea  hair loss  loss of appetite  muscle or joint pain    nausea, vomiting  pain, redness, or irritation at site where injected  tiredness This list may not describe all possible side effects. Call your doctor for medical advice about side effects. You may report side effects to FDA at 1-800-FDA-1088. Where should I keep my medicine? This drug is given in a hospital or clinic and will not be stored at home. NOTE: This sheet is a summary. It may not cover all possible information. If you have  questions about this medicine, talk to your doctor, pharmacist, or health care provider.  2021 Elsevier/Gold Standard (2019-01-16 13:37:23)

## 2020-03-30 NOTE — Progress Notes (Signed)
[employer]  [employer] representative met with patient in the infusion room at request of medical oncology to offer support and assess for needs.  Patient acknowledged feeling anxious and overwhelmed due to recent progression of disease and treatment/appointments.  CSW provided education on CSW role.  CSW and patient discussed the support team and support services at [facility].  Patient currently lives alone and has limited support through friends.  She speaks with her sister regularly who lives in [place].  Patient stated she does not have access to Internet so attending virtual support groups and programs would be difficult.  CSW and patient discussed the option of "calling in"; which she was open to.  Patient identified her O2 as a primary stressor and working with the DME company has been challenging.  Patient stated she was encouraged by another providers office to request to speak with nurse navigator.  CSW provided patient with contact information for both the nurse navigator and CSW that will continue to follow and support patient.  Patient was appreciative of CSW contact.  CSW encouraged patient to call with questions or concerns.           Johnnye Lana, MSW, LCSW, OSW-C Clinical Social Worker [facility] [phone]

## 2020-03-30 NOTE — Progress Notes (Signed)
Per Dr. Magrinat, ok to treat with elevated LFT's. 

## 2020-03-31 ENCOUNTER — Telehealth: Payer: Self-pay | Admitting: *Deleted

## 2020-03-31 ENCOUNTER — Telehealth: Payer: Self-pay | Admitting: Oncology

## 2020-03-31 NOTE — Telephone Encounter (Signed)
Called pt to see how she did with her recent chemo.  She reports that she is tired & achy.  She took compazine last hs & we discussed taking q 6 hr prn & maybe 30 min before eating to see if that will help her eat without vomiting.  Discussed tylenol for aches.  Pt reports knowing how to reach [place] with concerns.

## 2020-03-31 NOTE — Telephone Encounter (Signed)
Scheduled appts per 1/31 los. Left voicemail with appt dates and times.

## 2020-03-31 NOTE — Telephone Encounter (Signed)
-----   Message from Bo Mcclintock, RN sent at 03/30/2020  1:46 PM EST ----- Regarding: Dr [name] - 1st chemo follow-up Dr [name] patient. 1st chemo follow-up. Received paclitaxel.

## 2020-04-01 ENCOUNTER — Ambulatory Visit: Payer: Medicare PPO

## 2020-04-01 ENCOUNTER — Other Ambulatory Visit: Payer: Self-pay | Admitting: Oncology

## 2020-04-01 NOTE — Progress Notes (Signed)
I called Brooke [name] to check on how she was doing.  Today is day 3 of her first cycle of chemo.  She says it is rough.  She is hurting in her back.  She is having diarrhea.  She cannot eat.  She is trying to drink.  I offered to set her up with fluids tomorrow 0203--she is already set up for fluids 0204 and 0207.  She thinks she may be able to come tomorrow afternoon.  I left a note for my nurse to set that up

## 2020-04-02 ENCOUNTER — Other Ambulatory Visit: Payer: Self-pay

## 2020-04-02 ENCOUNTER — Other Ambulatory Visit: Payer: Self-pay | Admitting: *Deleted

## 2020-04-02 ENCOUNTER — Inpatient Hospital Stay: Payer: Medicare PPO | Attending: Oncology

## 2020-04-02 VITALS — BP 127/54 | HR 106 | Resp 18 | Ht 67.75 in | Wt 189.8 lb

## 2020-04-02 DIAGNOSIS — Z803 Family history of malignant neoplasm of breast: Secondary | ICD-10-CM | POA: Diagnosis not present

## 2020-04-02 DIAGNOSIS — Z800 Family history of malignant neoplasm of digestive organs: Secondary | ICD-10-CM | POA: Diagnosis not present

## 2020-04-02 DIAGNOSIS — C50912 Malignant neoplasm of unspecified site of left female breast: Secondary | ICD-10-CM | POA: Diagnosis not present

## 2020-04-02 DIAGNOSIS — M8580 Other specified disorders of bone density and structure, unspecified site: Secondary | ICD-10-CM | POA: Diagnosis not present

## 2020-04-02 DIAGNOSIS — Z170 Estrogen receptor positive status [ER+]: Secondary | ICD-10-CM | POA: Diagnosis not present

## 2020-04-02 DIAGNOSIS — C787 Secondary malignant neoplasm of liver and intrahepatic bile duct: Secondary | ICD-10-CM | POA: Insufficient documentation

## 2020-04-02 DIAGNOSIS — Z8042 Family history of malignant neoplasm of prostate: Secondary | ICD-10-CM | POA: Insufficient documentation

## 2020-04-02 DIAGNOSIS — Z8249 Family history of ischemic heart disease and other diseases of the circulatory system: Secondary | ICD-10-CM | POA: Diagnosis not present

## 2020-04-02 DIAGNOSIS — Z79899 Other long term (current) drug therapy: Secondary | ICD-10-CM | POA: Diagnosis not present

## 2020-04-02 DIAGNOSIS — C7800 Secondary malignant neoplasm of unspecified lung: Secondary | ICD-10-CM

## 2020-04-02 DIAGNOSIS — Z9012 Acquired absence of left breast and nipple: Secondary | ICD-10-CM | POA: Diagnosis not present

## 2020-04-02 DIAGNOSIS — Z8049 Family history of malignant neoplasm of other genital organs: Secondary | ICD-10-CM | POA: Diagnosis not present

## 2020-04-02 DIAGNOSIS — Z833 Family history of diabetes mellitus: Secondary | ICD-10-CM | POA: Insufficient documentation

## 2020-04-02 DIAGNOSIS — Z5111 Encounter for antineoplastic chemotherapy: Secondary | ICD-10-CM | POA: Diagnosis not present

## 2020-04-02 DIAGNOSIS — C7951 Secondary malignant neoplasm of bone: Secondary | ICD-10-CM | POA: Insufficient documentation

## 2020-04-02 DIAGNOSIS — E860 Dehydration: Secondary | ICD-10-CM | POA: Diagnosis not present

## 2020-04-02 DIAGNOSIS — Z95828 Presence of other vascular implants and grafts: Secondary | ICD-10-CM

## 2020-04-02 DIAGNOSIS — D649 Anemia, unspecified: Secondary | ICD-10-CM | POA: Diagnosis not present

## 2020-04-02 DIAGNOSIS — I10 Essential (primary) hypertension: Secondary | ICD-10-CM | POA: Diagnosis not present

## 2020-04-02 DIAGNOSIS — R197 Diarrhea, unspecified: Secondary | ICD-10-CM

## 2020-04-02 MED ORDER — ONDANSETRON HCL 4 MG/2ML IJ SOLN
8.0000 mg | Freq: Once | INTRAMUSCULAR | Status: AC
  Administered 2020-04-02: 8 mg via INTRAVENOUS

## 2020-04-02 MED ORDER — ONDANSETRON HCL 4 MG/2ML IJ SOLN
INTRAMUSCULAR | Status: AC
  Filled 2020-04-02: qty 4

## 2020-04-02 MED ORDER — LOPERAMIDE HCL 2 MG PO CAPS
ORAL_CAPSULE | ORAL | Status: AC
  Filled 2020-04-02: qty 1

## 2020-04-02 MED ORDER — LOPERAMIDE HCL 2 MG PO CAPS
2.0000 mg | ORAL_CAPSULE | Freq: Once | ORAL | Status: AC
  Administered 2020-04-02: 2 mg via ORAL

## 2020-04-02 MED ORDER — SODIUM CHLORIDE 0.9 % IV SOLN
INTRAVENOUS | Status: DC
  Filled 2020-04-02 (×2): qty 250

## 2020-04-02 NOTE — Patient Instructions (Signed)
Rehydration, Adult Rehydration is the replacement of body fluids, salts, and minerals (electrolytes) that are lost during dehydration. Dehydration is when there is not enough water or other fluids in the body. This happens when you lose more fluids than you take in. Common causes of dehydration include:  Not drinking enough fluids. This can occur when you are ill or doing activities that require a lot of energy, especially in hot weather.  Conditions that cause loss of water or other fluids, such as diarrhea, vomiting, sweating, or urinating a lot.  Other illnesses, such as fever or infection.  Certain medicines, such as those that remove excess fluid from the body (diuretics). Symptoms of mild or moderate dehydration may include thirst, dry lips and mouth, and dizziness. Symptoms of severe dehydration may include increased heart rate, confusion, fainting, and not urinating. For severe dehydration, you may need to get fluids through an IV at the hospital. For mild or moderate dehydration, you can usually rehydrate at home by drinking certain fluids as told by your health care provider. What are the risks? Generally, rehydration is safe. However, taking in too much fluid (overhydration) can be a problem. This is rare. Overhydration can cause an electrolyte imbalance, kidney failure, or a decrease in salt (sodium) levels in the body. Supplies needed You will need an oral rehydration solution (ORS) if your health care provider tells you to use one. This is a drink to treat dehydration. It can be found in pharmacies and retail stores. How to rehydrate Fluids Follow instructions from your health care provider for rehydration. The kind of fluid and the amount you should drink depend on your condition. In general, you should choose drinks that you prefer.  If told by your health care provider, drink an ORS. ? Make an ORS by following instructions on the package. ? Start by drinking small amounts,  about  cup (120 mL) every 5-10 minutes. ? Slowly increase how much you drink until you have taken the amount recommended by your health care provider.  Drink enough clear fluids to keep your urine pale yellow. If you were told to drink an ORS, finish it first, then start slowly drinking other clear fluids. Drink fluids such as: ? Water. This includes sparkling water and flavored water. Drinking only water can lead to having too little sodium in your body (hyponatremia). Follow the advice of your health care provider. ? Water from ice chips you suck on. ? Fruit juice with water you add to it (diluted). ? Sports drinks. ? Hot or cold herbal teas. ? Broth-based soups. ? Milk or milk products. Food Follow instructions from your health care provider about what to eat while you rehydrate. Your health care provider may recommend that you slowly begin eating regular foods in small amounts.  Eat foods that contain a healthy balance of electrolytes, such as bananas, oranges, potatoes, tomatoes, and spinach.  Avoid foods that are greasy or contain a lot of sugar. In some cases, you may get nutrition through a feeding tube that is passed through your nose and into your stomach (nasogastric tube, or NG tube). This may be done if you have uncontrolled vomiting or diarrhea.   Beverages to avoid Certain beverages may make dehydration worse. While you rehydrate, avoid drinking alcohol.   How to tell if you are recovering from dehydration You may be recovering from dehydration if:  You are urinating more often than before you started rehydrating.  Your urine is pale yellow.  Your energy level   improves.  You vomit less frequently.  You have diarrhea less frequently.  Your appetite improves or returns to normal.  You feel less dizzy or less light-headed.  Your skin tone and color start to look more normal. Follow these instructions at home:  Take over-the-counter and prescription medicines only  as told by your health care provider.  Do not take sodium tablets. Doing this can lead to having too much sodium in your body (hypernatremia). Contact a health care provider if:  You continue to have symptoms of mild or moderate dehydration, such as: ? Thirst. ? Dry lips. ? Slightly dry mouth. ? Dizziness. ? Dark urine or less urine than normal. ? Muscle cramps.  You continue to vomit or have diarrhea. Get help right away if you:  Have symptoms of dehydration that get worse.  Have a fever.  Have a severe headache.  Have been vomiting and the following happens: ? Your vomiting gets worse or does not go away. ? Your vomit includes blood or green matter (bile). ? You cannot eat or drink without vomiting.  Have problems with urination or bowel movements, such as: ? Diarrhea that gets worse or does not go away. ? Blood in your stool (feces). This may cause stool to look black and tarry. ? Not urinating, or urinating only a small amount of very dark urine, within 6-8 hours.  Have trouble breathing.  Have symptoms that get worse with treatment. These symptoms may represent a serious problem that is an emergency. Do not wait to see if the symptoms will go away. Get medical help right away. Call your local emergency services (911 in the [place] Do not drive yourself to the hospital. Summary  Rehydration is the replacement of body fluids and minerals (electrolytes) that are lost during dehydration.  Follow instructions from your health care provider for rehydration. The kind of fluid and amount you should drink depend on your condition.  Slowly increase how much you drink until you have taken the amount recommended by your health care provider.  Contact your health care provider if you continue to show signs of mild or moderate dehydration. This information is not intended to replace advice given to you by your health care provider. Make sure you discuss any questions you have with  your health care provider. Document Revised: 04/17/2019 Document Reviewed: 02/25/2019 Elsevier Patient Education  2021 [employer]  

## 2020-04-03 ENCOUNTER — Other Ambulatory Visit: Payer: Self-pay

## 2020-04-03 ENCOUNTER — Inpatient Hospital Stay: Payer: Medicare PPO

## 2020-04-03 VITALS — BP 139/57 | HR 89 | Temp 98.2°F | Resp 20

## 2020-04-03 DIAGNOSIS — Z5111 Encounter for antineoplastic chemotherapy: Secondary | ICD-10-CM | POA: Diagnosis not present

## 2020-04-03 DIAGNOSIS — C50812 Malignant neoplasm of overlapping sites of left female breast: Secondary | ICD-10-CM | POA: Diagnosis not present

## 2020-04-03 DIAGNOSIS — C7951 Secondary malignant neoplasm of bone: Secondary | ICD-10-CM

## 2020-04-03 DIAGNOSIS — C7800 Secondary malignant neoplasm of unspecified lung: Secondary | ICD-10-CM | POA: Diagnosis not present

## 2020-04-03 DIAGNOSIS — C50912 Malignant neoplasm of unspecified site of left female breast: Secondary | ICD-10-CM | POA: Diagnosis not present

## 2020-04-03 DIAGNOSIS — Z95828 Presence of other vascular implants and grafts: Secondary | ICD-10-CM

## 2020-04-03 DIAGNOSIS — Z9012 Acquired absence of left breast and nipple: Secondary | ICD-10-CM | POA: Diagnosis not present

## 2020-04-03 DIAGNOSIS — M8580 Other specified disorders of bone density and structure, unspecified site: Secondary | ICD-10-CM | POA: Diagnosis not present

## 2020-04-03 DIAGNOSIS — C787 Secondary malignant neoplasm of liver and intrahepatic bile duct: Secondary | ICD-10-CM | POA: Diagnosis not present

## 2020-04-03 DIAGNOSIS — D649 Anemia, unspecified: Secondary | ICD-10-CM | POA: Diagnosis not present

## 2020-04-03 DIAGNOSIS — Z170 Estrogen receptor positive status [ER+]: Secondary | ICD-10-CM | POA: Diagnosis not present

## 2020-04-03 DIAGNOSIS — E860 Dehydration: Secondary | ICD-10-CM | POA: Diagnosis not present

## 2020-04-03 MED ORDER — SODIUM CHLORIDE 0.9 % IV SOLN
INTRAVENOUS | Status: DC
  Filled 2020-04-03 (×2): qty 250

## 2020-04-03 NOTE — Patient Instructions (Signed)
Rehydration, Adult Rehydration is the replacement of body fluids, salts, and minerals (electrolytes) that are lost during dehydration. Dehydration is when there is not enough water or other fluids in the body. This happens when you lose more fluids than you take in. Common causes of dehydration include:  Not drinking enough fluids. This can occur when you are ill or doing activities that require a lot of energy, especially in hot weather.  Conditions that cause loss of water or other fluids, such as diarrhea, vomiting, sweating, or urinating a lot.  Other illnesses, such as fever or infection.  Certain medicines, such as those that remove excess fluid from the body (diuretics). Symptoms of mild or moderate dehydration may include thirst, dry lips and mouth, and dizziness. Symptoms of severe dehydration may include increased heart rate, confusion, fainting, and not urinating. For severe dehydration, you may need to get fluids through an IV at the hospital. For mild or moderate dehydration, you can usually rehydrate at home by drinking certain fluids as told by your health care provider. What are the risks? Generally, rehydration is safe. However, taking in too much fluid (overhydration) can be a problem. This is rare. Overhydration can cause an electrolyte imbalance, kidney failure, or a decrease in salt (sodium) levels in the body. Supplies needed You will need an oral rehydration solution (ORS) if your health care provider tells you to use one. This is a drink to treat dehydration. It can be found in pharmacies and retail stores. How to rehydrate Fluids Follow instructions from your health care provider for rehydration. The kind of fluid and the amount you should drink depend on your condition. In general, you should choose drinks that you prefer.  If told by your health care provider, drink an ORS. ? Make an ORS by following instructions on the package. ? Start by drinking small amounts,  about  cup (120 mL) every 5-10 minutes. ? Slowly increase how much you drink until you have taken the amount recommended by your health care provider.  Drink enough clear fluids to keep your urine pale yellow. If you were told to drink an ORS, finish it first, then start slowly drinking other clear fluids. Drink fluids such as: ? Water. This includes sparkling water and flavored water. Drinking only water can lead to having too little sodium in your body (hyponatremia). Follow the advice of your health care provider. ? Water from ice chips you suck on. ? Fruit juice with water you add to it (diluted). ? Sports drinks. ? Hot or cold herbal teas. ? Broth-based soups. ? Milk or milk products. Food Follow instructions from your health care provider about what to eat while you rehydrate. Your health care provider may recommend that you slowly begin eating regular foods in small amounts.  Eat foods that contain a healthy balance of electrolytes, such as bananas, oranges, potatoes, tomatoes, and spinach.  Avoid foods that are greasy or contain a lot of sugar. In some cases, you may get nutrition through a feeding tube that is passed through your nose and into your stomach (nasogastric tube, or NG tube). This may be done if you have uncontrolled vomiting or diarrhea.   Beverages to avoid Certain beverages may make dehydration worse. While you rehydrate, avoid drinking alcohol.   How to tell if you are recovering from dehydration You may be recovering from dehydration if:  You are urinating more often than before you started rehydrating.  Your urine is pale yellow.  Your energy level   improves.  You vomit less frequently.  You have diarrhea less frequently.  Your appetite improves or returns to normal.  You feel less dizzy or less light-headed.  Your skin tone and color start to look more normal. Follow these instructions at home:  Take over-the-counter and prescription medicines only  as told by your health care provider.  Do not take sodium tablets. Doing this can lead to having too much sodium in your body (hypernatremia). Contact a health care provider if:  You continue to have symptoms of mild or moderate dehydration, such as: ? Thirst. ? Dry lips. ? Slightly dry mouth. ? Dizziness. ? Dark urine or less urine than normal. ? Muscle cramps.  You continue to vomit or have diarrhea. Get help right away if you:  Have symptoms of dehydration that get worse.  Have a fever.  Have a severe headache.  Have been vomiting and the following happens: ? Your vomiting gets worse or does not go away. ? Your vomit includes blood or green matter (bile). ? You cannot eat or drink without vomiting.  Have problems with urination or bowel movements, such as: ? Diarrhea that gets worse or does not go away. ? Blood in your stool (feces). This may cause stool to look black and tarry. ? Not urinating, or urinating only a small amount of very dark urine, within 6-8 hours.  Have trouble breathing.  Have symptoms that get worse with treatment. These symptoms may represent a serious problem that is an emergency. Do not wait to see if the symptoms will go away. Get medical help right away. Call your local emergency services (911 in the [place] Do not drive yourself to the hospital. Summary  Rehydration is the replacement of body fluids and minerals (electrolytes) that are lost during dehydration.  Follow instructions from your health care provider for rehydration. The kind of fluid and amount you should drink depend on your condition.  Slowly increase how much you drink until you have taken the amount recommended by your health care provider.  Contact your health care provider if you continue to show signs of mild or moderate dehydration. This information is not intended to replace advice given to you by your health care provider. Make sure you discuss any questions you have with  your health care provider. Document Revised: 04/17/2019 Document Reviewed: 02/25/2019 Elsevier Patient Education  2021 [employer]  

## 2020-04-06 ENCOUNTER — Inpatient Hospital Stay: Payer: Medicare PPO | Admitting: Oncology

## 2020-04-06 ENCOUNTER — Inpatient Hospital Stay: Payer: Medicare PPO

## 2020-04-06 ENCOUNTER — Telehealth: Payer: Self-pay | Admitting: *Deleted

## 2020-04-06 ENCOUNTER — Other Ambulatory Visit: Payer: Self-pay | Admitting: *Deleted

## 2020-04-06 ENCOUNTER — Ambulatory Visit: Payer: Medicare PPO | Admitting: Nutrition

## 2020-04-06 ENCOUNTER — Other Ambulatory Visit: Payer: Self-pay

## 2020-04-06 ENCOUNTER — Encounter: Payer: Self-pay | Admitting: Licensed Clinical Social Worker

## 2020-04-06 VITALS — BP 137/59 | HR 100 | Temp 98.1°F | Resp 17 | Ht 67.0 in | Wt 188.4 lb

## 2020-04-06 DIAGNOSIS — Z170 Estrogen receptor positive status [ER+]: Secondary | ICD-10-CM

## 2020-04-06 DIAGNOSIS — D0361 Melanoma in situ of right upper limb, including shoulder: Secondary | ICD-10-CM

## 2020-04-06 DIAGNOSIS — C7951 Secondary malignant neoplasm of bone: Secondary | ICD-10-CM | POA: Diagnosis not present

## 2020-04-06 DIAGNOSIS — C787 Secondary malignant neoplasm of liver and intrahepatic bile duct: Secondary | ICD-10-CM | POA: Diagnosis not present

## 2020-04-06 DIAGNOSIS — E860 Dehydration: Secondary | ICD-10-CM | POA: Diagnosis not present

## 2020-04-06 DIAGNOSIS — C7800 Secondary malignant neoplasm of unspecified lung: Secondary | ICD-10-CM

## 2020-04-06 DIAGNOSIS — C50812 Malignant neoplasm of overlapping sites of left female breast: Secondary | ICD-10-CM | POA: Diagnosis not present

## 2020-04-06 DIAGNOSIS — C50912 Malignant neoplasm of unspecified site of left female breast: Secondary | ICD-10-CM | POA: Diagnosis not present

## 2020-04-06 DIAGNOSIS — Z9012 Acquired absence of left breast and nipple: Secondary | ICD-10-CM | POA: Diagnosis not present

## 2020-04-06 DIAGNOSIS — Z5111 Encounter for antineoplastic chemotherapy: Secondary | ICD-10-CM | POA: Diagnosis not present

## 2020-04-06 DIAGNOSIS — M8580 Other specified disorders of bone density and structure, unspecified site: Secondary | ICD-10-CM | POA: Diagnosis not present

## 2020-04-06 DIAGNOSIS — D649 Anemia, unspecified: Secondary | ICD-10-CM | POA: Diagnosis not present

## 2020-04-06 DIAGNOSIS — Z7189 Other specified counseling: Secondary | ICD-10-CM

## 2020-04-06 LAB — CBC WITH DIFFERENTIAL/PLATELET
Abs Immature Granulocytes: 0.19 10*3/uL — ABNORMAL HIGH (ref 0.00–0.07)
Basophils Absolute: 0 10*3/uL (ref 0.0–0.1)
Basophils Relative: 1 %
Eosinophils Absolute: 0.1 10*3/uL (ref 0.0–0.5)
Eosinophils Relative: 2 %
HCT: 36.6 % (ref 36.0–46.0)
Hemoglobin: 12 g/dL (ref 12.0–15.0)
Immature Granulocytes: 6 %
Lymphocytes Relative: 22 %
Lymphs Abs: 0.7 10*3/uL (ref 0.7–4.0)
MCH: 28.3 pg (ref 26.0–34.0)
MCHC: 32.8 g/dL (ref 30.0–36.0)
MCV: 86.3 fL (ref 80.0–100.0)
Monocytes Absolute: 0.3 10*3/uL (ref 0.1–1.0)
Monocytes Relative: 9 %
Neutro Abs: 2 10*3/uL (ref 1.7–7.7)
Neutrophils Relative %: 60 %
Platelets: 151 10*3/uL (ref 150–400)
RBC: 4.24 MIL/uL (ref 3.87–5.11)
RDW: 20 % — ABNORMAL HIGH (ref 11.5–15.5)
WBC: 3.3 10*3/uL — ABNORMAL LOW (ref 4.0–10.5)
nRBC: 6.1 % — ABNORMAL HIGH (ref 0.0–0.2)

## 2020-04-06 LAB — COMPREHENSIVE METABOLIC PANEL
ALT: 45 U/L — ABNORMAL HIGH (ref 0–44)
AST: 129 U/L — ABNORMAL HIGH (ref 15–41)
Albumin: 3.4 g/dL — ABNORMAL LOW (ref 3.5–5.0)
Alkaline Phosphatase: 300 U/L — ABNORMAL HIGH (ref 38–126)
Anion gap: 15 (ref 5–15)
BUN: 11 mg/dL (ref 8–23)
CO2: 15 mmol/L — ABNORMAL LOW (ref 22–32)
Calcium: 8.2 mg/dL — ABNORMAL LOW (ref 8.9–10.3)
Chloride: 106 mmol/L (ref 98–111)
Creatinine, Ser: 0.82 mg/dL (ref 0.44–1.00)
GFR, Estimated: >60 mL/min (ref >60)
Glucose, Bld: 113 mg/dL — ABNORMAL HIGH (ref 70–99)
Potassium: 3.7 mmol/L (ref 3.5–5.1)
Sodium: 136 mmol/L (ref 135–145)
Total Bilirubin: 1.3 mg/dL — ABNORMAL HIGH (ref 0.3–1.2)
Total Protein: 6.1 g/dL — ABNORMAL LOW (ref 6.5–8.1)

## 2020-04-06 MED ORDER — SODIUM CHLORIDE 0.9 % IV SOLN: 8.0000 mg | Freq: Once | INTRAVENOUS | Status: DC

## 2020-04-06 MED ORDER — SODIUM CHLORIDE 0.9 % IV SOLN
80.0000 mg/m2 | Freq: Once | INTRAVENOUS | Status: AC
  Administered 2020-04-06: 168 mg via INTRAVENOUS
  Filled 2020-04-06: qty 28

## 2020-04-06 MED ORDER — ONDANSETRON 4 MG PO TBDP: 4.0000 mg | ORAL_TABLET | Freq: Three times a day (TID) | ORAL | 0 refills | Status: AC | PRN

## 2020-04-06 MED ORDER — SODIUM CHLORIDE 0.9 % IV SOLN
10.0000 mg | Freq: Once | INTRAVENOUS | Status: AC
  Administered 2020-04-06: 10 mg via INTRAVENOUS
  Filled 2020-04-06: qty 10

## 2020-04-06 MED ORDER — DIPHENHYDRAMINE HCL 50 MG/ML IJ SOLN
25.0000 mg | Freq: Once | INTRAMUSCULAR | Status: AC
  Administered 2020-04-06: 25 mg via INTRAVENOUS

## 2020-04-06 MED ORDER — DIPHENHYDRAMINE HCL 50 MG/ML IJ SOLN
INTRAMUSCULAR | Status: AC
  Filled 2020-04-06: qty 1

## 2020-04-06 MED ORDER — ONDANSETRON HCL 4 MG/2ML IJ SOLN
INTRAMUSCULAR | Status: AC
  Filled 2020-04-06: qty 4

## 2020-04-06 MED ORDER — SODIUM CHLORIDE 0.9 % IV SOLN
Freq: Once | INTRAVENOUS | Status: AC
  Filled 2020-04-06: qty 250

## 2020-04-06 MED ORDER — FAMOTIDINE IN NACL 20-0.9 MG/50ML-% IV SOLN
20.0000 mg | Freq: Once | INTRAVENOUS | Status: AC
  Administered 2020-04-06: 20 mg via INTRAVENOUS

## 2020-04-06 MED ORDER — FAMOTIDINE IN NACL 20-0.9 MG/50ML-% IV SOLN
INTRAVENOUS | Status: AC
  Filled 2020-04-06: qty 50

## 2020-04-06 MED ORDER — LORAZEPAM 0.5 MG PO TABS: 0.5000 mg | ORAL_TABLET | Freq: Three times a day (TID) | ORAL | 0 refills | Status: DC | PRN

## 2020-04-06 MED ORDER — ONDANSETRON HCL 4 MG/2ML IJ SOLN
8.0000 mg | Freq: Once | INTRAMUSCULAR | Status: AC
  Administered 2020-04-06: 8 mg via INTRAVENOUS

## 2020-04-06 MED ORDER — SODIUM CHLORIDE 0.9 % IV SOLN
INTRAVENOUS | Status: DC
  Filled 2020-04-06 (×2): qty 250

## 2020-04-06 NOTE — Progress Notes (Signed)
I was notified by the social worker patient needed to speak to RD urgently.  70 year old female diagnosed with Estrogen receptor positive breast cancer (s/p left mastectomy) now widely metastatic.  She is receiving weekly Paclitaxel.  Past medical history includes hyperlipidemia, hypertension, anxiety, and anemia.  Medications include Decadron, Compazine, vitamin C, and vitamin E  Labs include glucose 113 and albumin 3.4  Height: 5 7 inches. Weight: 188.4 pounds Patient weighed 223 pounds in November 2021. BMI: 29.51.  Patient very tearful. Reports she cannot remember the last time she ate solid food. Tolerated 5 ounces bone broth on Saturday but could not tolerate it yesterday. Reports she is queasy and she has a "sore abdomen". States she is unable to take any oral medications because she vomits immediately. Reports 2 loose stools today but has had up to 8 a day.  She is refusing to take Imodium.   Reports she has a " gush of air" with any food intake. Her mouth is tender however she denies any mouth sores. Reports tolerating some sips of ginger ale today and water. Deferred nutrition focused physical exam as patient is very uncomfortable.  Expect some form of malnutrition but unable to diagnose without physical exam.  Nutrition diagnosis:  Unintended weight loss related to metastatic cancer as evidenced by 15% weight loss in less than 3 months which is significant.  Intervention: I provided supportive listening. Explained the importance of nausea medication to control nausea.  Suggested under the tongue Zofran and lorazepam to improve nausea.  Also enforced importance of increased IV fluids.  I spoke with MD. Reviewed very basic strategies for increasing fluids and bland solids. Provided fact sheets. RD to follow-up with patient within 2 days to evaluate effectiveness of nausea medication and ability to increase oral intake.  Monitoring, evaluation, goals: Patient will  tolerate oral intake with improved nausea.  Next visit: Wednesday by telephone.  Patient has my contact information for questions before this.  **Disclaimer: This note was dictated with voice recognition software. Similar sounding words can inadvertently be transcribed and this note may contain transcription errors which may not have been corrected upon publication of note.**

## 2020-04-06 NOTE — Patient Instructions (Signed)
Rehydration, Adult Rehydration is the replacement of body fluids, salts, and minerals (electrolytes) that are lost during dehydration. Dehydration is when there is not enough water or other fluids in the body. This happens when you lose more fluids than you take in. Common causes of dehydration include:  Not drinking enough fluids. This can occur when you are ill or doing activities that require a lot of energy, especially in hot weather.  Conditions that cause loss of water or other fluids, such as diarrhea, vomiting, sweating, or urinating a lot.  Other illnesses, such as fever or infection.  Certain medicines, such as those that remove excess fluid from the body (diuretics). Symptoms of mild or moderate dehydration may include thirst, dry lips and mouth, and dizziness. Symptoms of severe dehydration may include increased heart rate, confusion, fainting, and not urinating. For severe dehydration, you may need to get fluids through an IV at the hospital. For mild or moderate dehydration, you can usually rehydrate at home by drinking certain fluids as told by your health care provider. What are the risks? Generally, rehydration is safe. However, taking in too much fluid (overhydration) can be a problem. This is rare. Overhydration can cause an electrolyte imbalance, kidney failure, or a decrease in salt (sodium) levels in the body. Supplies needed You will need an oral rehydration solution (ORS) if your health care provider tells you to use one. This is a drink to treat dehydration. It can be found in pharmacies and retail stores. How to rehydrate Fluids Follow instructions from your health care provider for rehydration. The kind of fluid and the amount you should drink depend on your condition. In general, you should choose drinks that you prefer.  If told by your health care provider, drink an ORS. ? Make an ORS by following instructions on the package. ? Start by drinking small amounts,  about  cup (120 mL) every 5-10 minutes. ? Slowly increase how much you drink until you have taken the amount recommended by your health care provider.  Drink enough clear fluids to keep your urine pale yellow. If you were told to drink an ORS, finish it first, then start slowly drinking other clear fluids. Drink fluids such as: ? Water. This includes sparkling water and flavored water. Drinking only water can lead to having too little sodium in your body (hyponatremia). Follow the advice of your health care provider. ? Water from ice chips you suck on. ? Fruit juice with water you add to it (diluted). ? Sports drinks. ? Hot or cold herbal teas. ? Broth-based soups. ? Milk or milk products. Food Follow instructions from your health care provider about what to eat while you rehydrate. Your health care provider may recommend that you slowly begin eating regular foods in small amounts.  Eat foods that contain a healthy balance of electrolytes, such as bananas, oranges, potatoes, tomatoes, and spinach.  Avoid foods that are greasy or contain a lot of sugar. In some cases, you may get nutrition through a feeding tube that is passed through your nose and into your stomach (nasogastric tube, or NG tube). This may be done if you have uncontrolled vomiting or diarrhea.   Beverages to avoid Certain beverages may make dehydration worse. While you rehydrate, avoid drinking alcohol.   How to tell if you are recovering from dehydration You may be recovering from dehydration if:  You are urinating more often than before you started rehydrating.  Your urine is pale yellow.  Your energy level   improves.  You vomit less frequently.  You have diarrhea less frequently.  Your appetite improves or returns to normal.  You feel less dizzy or less light-headed.  Your skin tone and color start to look more normal. Follow these instructions at home:  Take over-the-counter and prescription medicines only  as told by your health care provider.  Do not take sodium tablets. Doing this can lead to having too much sodium in your body (hypernatremia). Contact a health care provider if:  You continue to have symptoms of mild or moderate dehydration, such as: ? Thirst. ? Dry lips. ? Slightly dry mouth. ? Dizziness. ? Dark urine or less urine than normal. ? Muscle cramps.  You continue to vomit or have diarrhea. Get help right away if you:  Have symptoms of dehydration that get worse.  Have a fever.  Have a severe headache.  Have been vomiting and the following happens: ? Your vomiting gets worse or does not go away. ? Your vomit includes blood or green matter (bile). ? You cannot eat or drink without vomiting.  Have problems with urination or bowel movements, such as: ? Diarrhea that gets worse or does not go away. ? Blood in your stool (feces). This may cause stool to look black and tarry. ? Not urinating, or urinating only a small amount of very dark urine, within 6-8 hours.  Have trouble breathing.  Have symptoms that get worse with treatment. These symptoms may represent a serious problem that is an emergency. Do not wait to see if the symptoms will go away. Get medical help right away. Call your local emergency services (911 in the [place] Do not drive yourself to the hospital. Summary  Rehydration is the replacement of body fluids and minerals (electrolytes) that are lost during dehydration.  Follow instructions from your health care provider for rehydration. The kind of fluid and amount you should drink depend on your condition.  Slowly increase how much you drink until you have taken the amount recommended by your health care provider.  Contact your health care provider if you continue to show signs of mild or moderate dehydration. This information is not intended to replace advice given to you by your health care provider. Make sure you discuss any questions you have with  your health care provider. Document Revised: 04/17/2019 Document Reviewed: 02/25/2019 Elsevier Patient Education  2021 [place] Discharge Instructions for Patients Receiving Chemotherapy  Today you received the following chemotherapy agents: paclitaxel.  To help prevent nausea and vomiting after your treatment, we encourage you to take your nausea medication as directed.   If you develop nausea and vomiting that is not controlled by your nausea medication, call the clinic.   BELOW ARE SYMPTOMS THAT SHOULD BE REPORTED IMMEDIATELY:  *FEVER GREATER THAN 100.5 F  *CHILLS WITH OR WITHOUT FEVER  NAUSEA AND VOMITING THAT IS NOT CONTROLLED WITH YOUR NAUSEA MEDICATION  *UNUSUAL SHORTNESS OF BREATH  *UNUSUAL BRUISING OR BLEEDING  TENDERNESS IN MOUTH AND THROAT WITH OR WITHOUT PRESENCE OF ULCERS  *URINARY PROBLEMS  *BOWEL PROBLEMS  UNUSUAL RASH Items with * indicate a potential emergency and should be followed up as soon as possible.  Feel free to call the clinic should you have any questions or concerns. The clinic phone number is [phone].  Please show the [place] at check-in to the Emergency Department and triage nurse.  Paclitaxel injection What is this medicine? PACLITAXEL (PAK li TAX el) is a  chemotherapy drug. It targets fast dividing cells, like cancer cells, and causes these cells to die. This medicine is used to treat ovarian cancer, breast cancer, lung cancer, Kaposi's sarcoma, and other cancers. This medicine may be used for other purposes; ask your health care provider or pharmacist if you have questions. COMMON BRAND NAME(S): Onxol, Taxol What should I tell my health care provider before I take this medicine? They need to know if you have any of these conditions:  history of irregular heartbeat  liver disease  low blood counts, like low white cell, platelet, or red cell counts  lung or breathing disease, like  asthma  tingling of the fingers or toes, or other nerve disorder  an unusual or allergic reaction to paclitaxel, alcohol, polyoxyethylated castor oil, other chemotherapy, other medicines, foods, dyes, or preservatives  pregnant or trying to get pregnant  breast-feeding How should I use this medicine? This drug is given as an infusion into a vein. It is administered in a hospital or clinic by a specially trained health care professional. Talk to your pediatrician regarding the use of this medicine in children. Special care may be needed. Overdosage: If you think you have taken too much of this medicine contact a poison control center or emergency room at once. NOTE: This medicine is only for you. Do not share this medicine with others. What if I miss a dose? It is important not to miss your dose. Call your doctor or health care professional if you are unable to keep an appointment. What may interact with this medicine? Do not take this medicine with any of the following medications:  live virus vaccines This medicine may also interact with the following medications:  antiviral medicines for hepatitis, HIV or AIDS  certain antibiotics like erythromycin and clarithromycin  certain medicines for fungal infections like ketoconazole and itraconazole  certain medicines for seizures like carbamazepine, phenobarbital, phenytoin  gemfibrozil  nefazodone  rifampin  St. John's wort This list may not describe all possible interactions. Give your health care provider a list of all the medicines, herbs, non-prescription drugs, or dietary supplements you use. Also tell them if you smoke, drink alcohol, or use illegal drugs. Some items may interact with your medicine. What should I watch for while using this medicine? Your condition will be monitored carefully while you are receiving this medicine. You will need important blood work done while you are taking this medicine. This medicine can  cause serious allergic reactions. To reduce your risk you will need to take other medicine(s) before treatment with this medicine. If you experience allergic reactions like skin rash, itching or hives, swelling of the face, lips, or tongue, tell your doctor or health care professional right away. In some cases, you may be given additional medicines to help with side effects. Follow all directions for their use. This drug may make you feel generally unwell. This is not uncommon, as chemotherapy can affect healthy cells as well as cancer cells. Report any side effects. Continue your course of treatment even though you feel ill unless your doctor tells you to stop. Call your doctor or health care professional for advice if you get a fever, chills or sore throat, or other symptoms of a cold or flu. Do not treat yourself. This drug decreases your body's ability to fight infections. Try to avoid being around people who are sick. This medicine may increase your risk to bruise or bleed. Call your doctor or health care professional if you notice  any unusual bleeding. Be careful brushing and flossing your teeth or using a toothpick because you may get an infection or bleed more easily. If you have any dental work done, tell your dentist you are receiving this medicine. Avoid taking products that contain aspirin, acetaminophen, ibuprofen, naproxen, or ketoprofen unless instructed by your doctor. These medicines may hide a fever. Do not become pregnant while taking this medicine. Women should inform their doctor if they wish to become pregnant or think they might be pregnant. There is a potential for serious side effects to an unborn child. Talk to your health care professional or pharmacist for more information. Do not breast-feed an infant while taking this medicine. Men are advised not to father a child while receiving this medicine. This product may contain alcohol. Ask your pharmacist or healthcare provider if  this medicine contains alcohol. Be sure to tell all healthcare providers you are taking this medicine. Certain medicines, like metronidazole and disulfiram, can cause an unpleasant reaction when taken with alcohol. The reaction includes flushing, headache, nausea, vomiting, sweating, and increased thirst. The reaction can last from 30 minutes to several hours. What side effects may I notice from receiving this medicine? Side effects that you should report to your doctor or health care professional as soon as possible:  allergic reactions like skin rash, itching or hives, swelling of the face, lips, or tongue  breathing problems  changes in vision  fast, irregular heartbeat  high or low blood pressure  mouth sores  pain, tingling, numbness in the hands or feet  signs of decreased platelets or bleeding - bruising, pinpoint red spots on the skin, black, tarry stools, blood in the urine  signs of decreased red blood cells - unusually weak or tired, feeling faint or lightheaded, falls  signs of infection - fever or chills, cough, sore throat, pain or difficulty passing urine  signs and symptoms of liver injury like dark yellow or brown urine; general ill feeling or flu-like symptoms; light-colored stools; loss of appetite; nausea; right upper belly pain; unusually weak or tired; yellowing of the eyes or skin  swelling of the ankles, feet, hands  unusually slow heartbeat Side effects that usually do not require medical attention (report to your doctor or health care professional if they continue or are bothersome):  diarrhea  hair loss  loss of appetite  muscle or joint pain  nausea, vomiting  pain, redness, or irritation at site where injected  tiredness This list may not describe all possible side effects. Call your doctor for medical advice about side effects. You may report side effects to FDA at 1-800-FDA-1088. Where should I keep my medicine? This drug is given in a  hospital or clinic and will not be stored at home. NOTE: This sheet is a summary. It may not cover all possible information. If you have questions about this medicine, talk to your doctor, pharmacist, or health care provider.  2021 Elsevier/[employer] Standard (2019-01-16 13:37:23)

## 2020-04-06 NOTE — Telephone Encounter (Signed)
Per discussion by SW and dietician pt states she is having continued nausea and vomiting with very little intake besides, water, ginger ale, and italian ice.  She is not using prochlorperazine due to " not being able to keep anything down "  She states she is having up to 8 stools a day but is very hesitant to use imodium.  ( she stated concern due to having cancer in her abdomen and concern for blockage )  Per review with MD- recommendation obtained for ondansetron ODT and lorazepam 0.5 mg SL for use of symptoms.  Pt was offered for additional IVFs but declined except on Wednesday 2/9.  Above medications obtained and sent to her pharmacy.

## 2020-04-06 NOTE — Progress Notes (Signed)
[place]  [phone] Fax:(336) [phone]    ID: [name] DOB: Jun 27, 1950  MR#: [id number]  GYB#:[id number]  Patient Care Team: [name], MD as PCP - General (Family Medicine) Rolm Bookbinder, MD as Attending Physician (Dermatology) Marilyn Wing, Virgie Dad, MD as Consulting Physician (Oncology) Kyung Rudd, MD as Consulting Physician (Radiation Oncology) Jovita Kussmaul, MD as Consulting Physician (General Surgery) Irene Limbo, MD as Consulting Physician (Plastic Surgery) Delice Bison, Charlestine Massed, NP as Nurse Practitioner (Hematology and Oncology) [name], MD as Consulting Physician (Obstetrics and Gynecology) Laurey Morale, MD as Consulting Physician (Family Medicine) OTHER MD:   CHIEF COMPLAINT: Estrogen receptor positive breast cancer (s/p left mastectomy) now widely metastatic  CURRENT TREATMENT: Weekly paclitaxel   INTERVAL HISTORY: Brooke [name] returns today for follow-up and treatment of her now widely disseminated breast cancer, which is weakly estrogen receptor positive.    She started weekly paclitaxel at her last visit on 03/31/2019. Today is day 1 cycle 2.  She tolerated the first dose without any obvious side effects and particularly there was no vomiting although she is not able to take anything by mouth she says because anything she takes in, even water, causes her to have loose bowel movements.  She also started zoledronate on 03/19/2020, to be repeated every 12 weeks.  Discussed her severe bone pain.  That pain has resolved.  REVIEW OF SYSTEMS: Brooke [name] tells me she is unable to eat anything.  She cannot take the Decadron, Compazine, or any other of her medications including her vitamins.  It is not so much that is she has mouth sores, although her mouth does hurt, or that she vomits it is just that she "just cannot take it ".  She is having some sips of water.  She has diarrhea.  She cannot tell me whether the IV fluids she  received last week were helpful or not.  She thinks they may have been.  She still does not have the oxygen at home.  As noted before she is very independent, does not share much of what is happening with family and has not asked her older sister to, give her hand during these very difficult weeks.   COVID 19 VACCINATION STATUS: fully vaccinated [employer]), with booster 12/28/2019    HISTORY OF CURRENT ILLNESS: From the original intake note:  Brooke [name] herself palpated a change in her left breast and brought this to medical attention.  On 01/18/2017 she underwent bilateral diagnostic mammography with tomography and bilateral breast ultrasonography [facility].  The breast density was category B.  At the most recent exam was from December 2016.  In the right breast upper outer quadrant there was a 1 cm irregular mass associated with punctate calcification.  By ultrasound this was not well-defined, and it was biopsied on 01/26/2017 with tomographic guidance this showed a complex sclerosing lesion with the usual ductal hyperplasia.  The right axilla was sonographically  On the left mammography showed a 4 cm dense mass with indistinct margins in the upper inner quadrant.  Ultrasound defined a 3 cm irregular mass in the upper inner quadrant of the left breast which was palpable.  There was an abnormal lymph node in the left axilla.  Biopsy of the left mass and abnormal lymph node ([id number], on 01/26/2017) showed both to be involved by invasive ductal carcinoma, with some lobular features but E-cadherin positive, estrogen and progesterone receptors both 100% positive, with strong staining intensity, with MIB-1 between 15-20%.  There was no HER-2 amplification, the signals ratio being 1.31/1.58 and the number per cell 2.30/2.05  The patient's subsequent history is as detailed below.   PAST MEDICAL HISTORY: Past Medical History:  Diagnosis Date  . Anemia   . Anxiety   . Arthritis   . ASCUS of cervix with  negative high risk HPV 12/2018  . Bilateral dry eyes   . Breast cancer, left breast ([place])   . Family history of breast cancer 02/02/2017  . Family history of breast cancer   . Family history of colon cancer 02/02/2017  . Family history of colon cancer   . Family history of prostate cancer   . Family history of prostate cancer in father 02/02/2017  . Family history of uterine cancer   . Fibroid   . History of kidney stones   . Hyperlipidemia   . Hypertension   . Melanoma of upper arm ([place] [employer]) 2009   RIGHT ARM   . Obesity (BMI 30.0-34.9)   . Osteopenia 09/2017   T score -1.9 max 8% / 0.8% statistically significant decline at right and left hip stable at the spine  . PONV (postoperative nausea and vomiting)   . Squamous carcinoma     of the skin  . UTI (urinary tract infection)   . Varicose veins of both lower extremities   . Vitamin D deficiency 07/2009   LOW VITAMIN D 29  . Vitamin deficiency 07/2008   VITAMIN D LOW 23    PAST SURGICAL HISTORY: Past Surgical History:  Procedure Laterality Date  . ABDOMINAL SURGERY  1975   ovairan cyst   . APPENDECTOMY    . BREAST LUMPECTOMY WITH RADIOACTIVE SEED LOCALIZATION Right 09/25/2017   Procedure: RIGHT BREAST LUMPECTOMY WITH RADIOACTIVE SEED LOCALIZATION;  Surgeon: Jovita Kussmaul, MD;  Location: [place];  Service: General;  Laterality: Right;  . BREAST SURGERY     bio  . CHOLECYSTECTOMY    . DERMOID CYST  EXCISION    . DILATATION & CURETTAGE/HYSTEROSCOPY WITH MYOSURE N/A 04/23/2019   Procedure: DILATATION & CURETTAGE/HYSTEROSCOPY WITH MYOSURE;  Surgeon: [name], MD;  Location: [place];  Service: Gynecology;  Laterality: N/A;  request to follow at 9:30am in [facility] block requests 45 minutes  . DILATION AND CURETTAGE OF UTERUS  1998  . GALLBLADDER SURGERY    . HYSTEROSCOPY    . insertion  port a cath  03/02/2016  . MASTECTOMY MODIFIED RADICAL Left 09/25/2017   Procedure: LEFT MODIFIED RADICAL MASTECTOMY;   Surgeon: Jovita Kussmaul, MD;  Location: [place];  Service: General;  Laterality: Left;  . PORTACATH PLACEMENT N/A 03/02/2017   Procedure: INSERTION PORT-A-CATH;  Surgeon: Jovita Kussmaul, MD;  Location: [place];  Service: General;  Laterality: N/A;  . SKIN BIOPSY    . SKIN CANCER EXCISION  2010,2011   2010-RIGHT ARM, 2011-LEFT LOWER LEG.-DR Rolm Bookbinder DERM. DR. Sarajane Jews IS HER DERM SURGEON.    FAMILY HISTORY Family History  Problem Relation Age of Onset  . Diabetes Father   . Hypertension Father   . Heart disease Father   . Prostate cancer Father 72  . Arthritis Father   . Breast cancer Sister 50       again at age 34, GT results unk  . Skin cancer Sister   . Colon cancer Paternal Grandfather 63  . Hypertension Mother   . Heart failure Mother   . Uterine cancer Sister 31       stage IV  .  Breast cancer Cousin        mat first cousin dx under 1  The patient's father was diagnosed with prostate cancer in his 55s and died at age 75 from complications of that disease.  The patient's mother died at age 77 with congestive heart failure.  The patient had no brothers, 3 sisters.  One sister had breast cancer at age 64 and again at age 57.  She has been genetically tested and was found to be BRCA not mutated in addition a paternal grandfather had colon cancer in his 72s.  There is no history of ovarian cancer in the family to the patient's knowledge.   GYNECOLOGIC HISTORY:  Patient's last menstrual period was 03/18/1997. Menarche age 36. The patient is GX P0.  She stopped having periods in 1999 and took hormone replacement approximately 5 years.   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  She is now retired.  She is single and lives alone with no pets.    ADVANCED DIRECTIVES: Not in place   HEALTH MAINTENANCE: Social History   Tobacco Use  . Smoking status: Never Smoker   . Smokeless tobacco: Never Used  Vaping Use  . Vaping Use: Never used  Substance Use Topics  . Alcohol use: No  . Drug use: No    Colonoscopy: Never  PAP: 12/2019, negative  Bone density: 2017/osteopenia   Allergies  Allergen Reactions  . Neosporin [Neomycin-Bacitracin Zn-Polymyx] Swelling, Rash and Other (See Comments)    Blisters  . Bactrim [Sulfamethoxazole-Trimethoprim]     Made her feel very sick  . Benzalkonium Chloride Other (See Comments)    Redness (Pt is unaware)  It works by killing microorganisms and inhibiting their future growth, and for this reason frequently appears as an ingredient in antibacterial hand wipes, antiseptic creams and anti-itch ointments.  . Latex Rash    Current Outpatient Medications  Medication Sig Dispense Refill  . dexamethasone (DECADRON) 4 MG tablet Take 1 tablet (4 mg total) by mouth daily. 60 tablet 1  . LORazepam (ATIVAN) 0.5 MG tablet Place 1 tablet (0.5 mg total) under the tongue every 8 (eight) hours as needed for anxiety. PT to use for nausea as needed 30 tablet 0  . losartan (COZAAR) 25 MG tablet TAKE 1 TABLET(25 MG) BY MOUTH DAILY (Patient taking differently: Taking 1/2 tablet) 90 tablet 1  . ondansetron (ZOFRAN-ODT) 4 MG disintegrating tablet Take 1 tablet (4 mg total) by mouth every 8 (eight) hours as needed for nausea or vomiting. 20 tablet 0  . prochlorperazine (COMPAZINE) 10 MG tablet Take 1 tablet (10 mg total) by mouth every 6 (six) hours as needed (Nausea or vomiting). 30 tablet 1  . Propylene Glycol 0.6 % SOLN Place 1 drop into both eyes daily.    . vitamin C (ASCORBIC ACID) 500 MG tablet Take 1,000 mg by mouth daily.     . vitamin E 600 UNIT capsule Take 400 Units by mouth 2 (two) times a week.      No current facility-administered medications for this visit.   Facility-Administered Medications Ordered in Other Visits  Medication Dose Route Frequency Provider Last Rate Last Admin  . 0.9 %  sodium chloride infusion    Intravenous Continuous Kaylie Ritter, Virgie Dad, MD   Stopped at 04/06/20 1658    OBJECTIVE:  white woman examined in a wheelchair Vitals:   04/06/20 1210  BP: (!) 137/59  Pulse: 100  Resp: 17  Temp: 98.1 F (36.7 C)  SpO2: 99%     Body mass index is 29.51 kg/m.   Wt Readings from Last 3 Encounters:  04/06/20 188 lb 6.4 oz (85.5 kg)  04/02/20 189 lb 12.8 oz (86.1 kg)  03/30/20 190 lb 3.2 oz (86.3 kg)  ECOG FS:1 - Symptomatic but completely ambulatory  Sclerae unicteric, EOMs intact Wearing a mask No cervical or supraclavicular adenopathy Lungs no rales or rhonchi Heart regular rate and rhythm Abd soft, nontender, positive bowel sounds MSK no focal spinal tenderness, no upper extremity lymphedema Neuro: nonfocal, well oriented, tearful affect Breasts: Deferred Skin: The lesion at the nape of the neck appears to be slightly smaller.  The lesion above the right eyebrow appears unchanged   LAB RESULTS:  CMP     Component Value Date/Time   NA 136 04/06/2020 1147   NA 141 03/03/2017 1358   K 3.7 04/06/2020 1147   K 3.3 (L) 03/03/2017 1358   CL 106 04/06/2020 1147   CO2 15 (L) 04/06/2020 1147   CO2 28 03/03/2017 1358   GLUCOSE 113 (H) 04/06/2020 1147   GLUCOSE 107 03/03/2017 1358   BUN 11 04/06/2020 1147   BUN 12.7 03/03/2017 1358   CREATININE 0.82 04/06/2020 1147   CREATININE 0.83 02/19/2020 0936   CREATININE 0.9 03/03/2017 1358   CALCIUM 8.2 (L) 04/06/2020 1147   CALCIUM 9.4 03/03/2017 1358   PROT 6.1 (L) 04/06/2020 1147   PROT 6.5 03/03/2017 1358   ALBUMIN 3.4 (L) 04/06/2020 1147   ALBUMIN 3.7 03/03/2017 1358   AST 129 (H) 04/06/2020 1147   AST 43 (H) 09/12/2018 1354   AST 23 03/03/2017 1358   ALT 45 (H) 04/06/2020 1147   ALT 84 (H) 09/12/2018 1354   ALT 25 03/03/2017 1358   ALKPHOS 300 (H) 04/06/2020 1147   ALKPHOS 71 03/03/2017 1358   BILITOT 1.3 (H) 04/06/2020 1147   BILITOT 0.6 09/12/2018 1354   BILITOT 0.52 03/03/2017 1358   GFRNONAA >60 04/06/2020 1147    GFRNONAA >60 09/12/2018 1354   GFRAA >60 06/12/2019 1401   GFRAA >60 09/12/2018 1354    No results found for: TOTALPROTELP, ALBUMINELP, A1GS, A2GS, BETS, BETA2SER, GAMS, MSPIKE, SPEI  No results found for: Nils Pyle, [facility]  Lab Results  Component Value Date   WBC 3.3 (L) 04/06/2020   NEUTROABS 2.0 04/06/2020   HGB 12.0 04/06/2020   HCT 36.6 04/06/2020   MCV 86.3 04/06/2020   PLT 151 04/06/2020      Chemistry      Component Value Date/Time   NA 136 04/06/2020 1147   NA 141 03/03/2017 1358   K 3.7 04/06/2020 1147   K 3.3 (L) 03/03/2017 1358   CL 106 04/06/2020 1147   CO2 15 (L) 04/06/2020 1147   CO2 28 03/03/2017 1358   BUN 11 04/06/2020 1147   BUN 12.7 03/03/2017 1358   CREATININE 0.82 04/06/2020 1147   CREATININE 0.83 02/19/2020 0936   CREATININE 0.9 03/03/2017 1358      Component Value Date/Time   CALCIUM 8.2 (L) 04/06/2020 1147   CALCIUM 9.4 03/03/2017 1358   ALKPHOS 300 (H) 04/06/2020 1147   ALKPHOS 71 03/03/2017 1358   AST 129 (H) 04/06/2020 1147   AST 43 (H) 09/12/2018 1354   AST 23 03/03/2017 1358   ALT 45 (H) 04/06/2020 1147   ALT 84 (H) 09/12/2018 1354   ALT 25 03/03/2017 1358   BILITOT 1.3 (H) 04/06/2020 1147  BILITOT 0.6 09/12/2018 1354   BILITOT 0.52 03/03/2017 1358       No results found for: LABCA2  No components found for: ENIDPO242  No results for input(s): INR in the last 168 hours.  No results found for: LABCA2  Lab Results  Component Value Date   PNT614 32 03/06/2020    No results found for: ERX540  No results found for: GQQ761  Lab Results  Component Value Date   CA2729 983.0 (H) 03/06/2020    No components found for: HGQUANT  Lab Results  Component Value Date   CEA1 744.17 (H) 03/06/2020   /  CEA ([facility])  Date Value Ref Range Status  03/06/2020 744.17 (H) 0.00 - 5.00 ng/mL Final    Comment:    (NOTE) This test was performed using [employer] Chemiluminescent Microparticle  Immunoassay. Values obtained from different assay methods cannot be used interchangeably. Please note that 5-10% of patients who smoke may see CEA levels up to 6.9 ng/mL. Performed at [facility], [place] [address], [place], [place] [address]      No results found for: AFPTUMOR  No results found for: CHROMOGRNA  No results found for: HGBA, HGBA2QUANT, HGBFQUANT, HGBSQUAN (Hemoglobinopathy evaluation)   No results found for: LDH  Lab Results  Component Value Date   IRON 52 02/19/2020   TIBC 327 02/19/2020   IRONPCTSAT 16 02/19/2020   (Iron and TIBC)  Lab Results  Component Value Date   FERRITIN 298 (H) 02/19/2020    Urinalysis    Component Value Date/Time   COLORURINE DARK YELLOW 02/19/2020 0947   APPEARANCEUR CLOUDY (A) 02/19/2020 0947   LABSPEC 1.018 02/19/2020 0947   PHURINE 6.0 02/19/2020 0947   GLUCOSEU NEGATIVE 02/19/2020 0947   GLUCOSEU NEGATIVE 11/05/2012 1559   HGBUR 1+ (A) 02/19/2020 0947   BILIRUBINUR neg 08/10/2018 1529   KETONESUR 2+ (A) 02/19/2020 0947   PROTEINUR 2+ (A) 02/19/2020 0947   UROBILINOGEN 0.2 08/10/2018 1529   UROBILINOGEN 0.2 11/05/2012 1559   NITRITE neg 08/10/2018 1529   NITRITE NEGATIVE 05/29/2015 1113   LEUKOCYTESUR Negative 08/10/2018 1529    STUDIES: MR Brain W Wo Contrast  Result Date: 03/26/2020 CLINICAL DATA:  Malignant neoplasm of overlapping sites of left breast in female, estrogen receptor positive. Breast cancer, staging; evaluate for brain metastases or meningeal spread. EXAM: MRI HEAD WITHOUT AND WITH CONTRAST TECHNIQUE: Multiplanar, multiecho pulse sequences of the brain and surrounding structures were obtained without and with intravenous contrast. CONTRAST:  38m GADAVIST GADOBUTROL 1 MMOL/ML IV SOLN COMPARISON:  Head CT 03/05/2020. FINDINGS: Brain: Mild intermittent motion degradation. Cerebral volume is normal for age. There is pachymeningeal dural thickening and enhancement overlying the right  greater than left cerebral convexities. This measures up to 8 mm in thickness overlying the right cerebral convexity (series 17, image 23). Findings are compatible with dural-based metastatic disease. Additionally, there is nodular dural based metastatic disease along the mid falx measuring 4.5 x 1.0 cm (AP X TV). 1 cm enhancing lesion along the left cerebellum which appears dural based (series 16, image 37). Mild surrounding parenchymal edema. No other parenchymal lesions are identified. No significant cerebral white matter disease. Small chronic infarct within the right cerebellum. There is no acute infarct. No chronic intracranial blood products. No extra-axial fluid collection. No midline shift. Vascular: Expected proximal arterial flow voids. Skull and upper cervical spine: Inhomogeneous T1 hypointense signal throughout the calvarium. Sinuses/Orbits: Visualized orbits show no acute finding. Trace ethmoid and right maxillary sinus  mucosal thickening. Other: Numerous nonspecific small lesions within the bilateral scalp and visualized neck. These results will be called to the ordering clinician or representative by the Radiologist Assistant, and communication documented in the PACS or [employer]. IMPRESSION: Pachymeningeal dural thickening and enhancement overlying the right greater than left cerebral convexities measuring up to 8 mm in thickness. Findings are compatible with dural-based metastatic disease. Additional nodular dural-based metastatic disease along the mid falx measuring 4.5 x 1.0 cm (AP x TV). 1 cm enhancing lesion along the left cerebellum which appears dural-based. This may reflect additional dural-based metastatic disease or a small meningioma. Mild parenchymal edema within the underlying left cerebellum. Inhomogeneous T1 hypointense signal within the bilateral calvarium, highly suspicious for calvarial metastases. Numerous small nonspecific lesions within the bilateral scalp and visualized  neck. Direct visualization recommended. Small chronic lacunar infarct within the right cerebellar hemisphere. Electronically Signed   By: Kellie Simmering DO   On: 03/26/2020 14:52   [place] CORE BIOPSY (LIVER)  Result Date: 03/13/2020 INDICATION: 70 year old female with hepatic metastatic lesions of uncertain primary. EXAM: ULTRASOUND GUIDED LIVER LESION BIOPSY COMPARISON:  None. MEDICATIONS: None ANESTHESIA/SEDATION: Fentanyl 50 mcg IV; Versed 1 mg IV Total Moderate Sedation time:  20 minutes. The patient's level of consciousness and vital signs were monitored continuously by radiology nursing throughout the procedure under my direct supervision. COMPLICATIONS: None immediate. PROCEDURE: Informed written consent was obtained from the patient after a discussion of the risks, benefits and alternatives to treatment. The patient understands and consents the procedure. A timeout was performed prior to the initiation of the procedure. Ultrasound scanning was performed of the right upper abdominal quadrant demonstrates heterogeneously hypoechoic rounded mass in the left lobe measuring up to approximately 2.0 cm. The left lobe mass was selected for biopsy and the procedure was planned. The subxiphoid abdominal area was prepped and draped in the usual sterile fashion. The overlying soft tissues were anesthetized with 1% lidocaine with epinephrine. A 17 gauge, 6.8 cm co-axial needle was advanced into a peripheral aspect of the lesion. This was followed by 3 core biopsies with an 18 gauge core device under direct ultrasound guidance. The coaxial needle tract was embolized with a small amount of Gel-Foam slurry and superficial hemostasis was obtained with manual compression. Post procedural scanning was negative for definitive area of hemorrhage or additional complication. A dressing was placed. The patient tolerated the procedure well without immediate post procedural complication. IMPRESSION: Technically successful ultrasound  guided core needle biopsy of left lobe hepatic mass, presumed metastasis. Ruthann Cancer, MD [facility] Electronically Signed   By: Ruthann Cancer MD   On: 03/13/2020 17:05     ELIGIBLE FOR AVAILABLE RESEARCH PROTOCOL: no   ASSESSMENT: 70 y.o. [place] woman status post bilateral biopsies 01/26/2017, showing  (1) in the right breast, a complex sclerosing lesion, status post lumpectomy 09/25/2017, with no malignancy identified.  (2) in the left breast, a cT2 pN1 invasive ductal carcinoma (with some lobular features but E-cadherin positive), grade 2, estrogen and progesterone receptor positive, HER-2 not amplified, with an MIB-1 of 15-20%  (a) breast MRI 02/04/2017 suggests a T3 N1 tumor  (3) started neoadjuvant cyclophosphamide/docetaxel 03/03/2017, discontinued after 1 cycle with very poor tolerance  (a) started cyclophosphamide/methotrexate/fluorouracil (CMF) 03/28/2017, repeated x7 cycles, last dose 08/01/2017  (4) status post left modified radical mastectomy 09/25/17 for a ypT5 ypN2, stage IIIA invasive ductal carcinoma, grade 2, again estrogen and progesterone receptor positive, HER-2 not amplified  (5) adjuvant  radiation completed 12/21/2017  (a) capecitabine sensitization tolerated only the initial week of radiation (b) Site/dose: The patient initially received a dose of 50.4 Gy in 28 fractions to the left chest wall and left supraclavicular region. This was delivered using a 3-D conformal, 4 field technique. The patient then received a boost to the mastectomy scar. This delivered an additional 10 Gy in 5 fractions using an en face electron field. The total dose was 60.4 Gy.  ( 6) tamoxifen started 03/09/2018, discontinued January 2022 with metastatic progression  (7) genetics testing 12/12/2017 through the Multi-Gene Panel offered by [facility] found no deleterious mutations in AIP, ALK, APC, ATM, AXIN2,BAP1,  BARD1, BLM, BMPR1A,  BRCA1, BRCA2, BRIP1, CASR, CDC73, CDH1, CDK4, CDKN1B, CDKN1C, CDKN2A (p14ARF), CDKN2A (p16INK4a), CEBPA, CHEK2, CTNNA1, DICER1, DIS3L2, EGFR (c.2369C>T, p.Thr790Met variant only), EPCAM (Deletion/duplication testing only), FH, FLCN, GATA2, GPC3, GREM1 (Promoter region deletion/duplication testing only), HOXB13 (c.251G>A, p.Gly84Glu), HRAS, KIT, MAX, MEN1, MET, MITF (c.952G>A, p.Glu318Lys variant only), MLH1, MSH2, MSH3, MSH6, MUTYH, NBN, NF1, NF2, NTHL1, PALB2, PDGFRA, PHOX2B, PMS2, POLD1, POLE, POT1, PRKAR1A, PTCH1, PTEN, RAD50, RAD51C, RAD51D, RB1, RECQL4, RET, RUNX1, SDHAF2, SDHA (sequence changes only), SDHB, SDHC, SDHD, SMAD4, SMARCA4, SMARCB1, SMARCE1, STK11, SUFU, TERC, TERT, TMEM127, TP53, TSC1, TSC2, VHL, WRN and WT1.   (8) Foundation 1 testing shows stable microsatellite status and 1 mutation/ Mb, TP53 mutations x2  (a) PD-L1 dated July 2019 negative  METASTATIC DISEASE: January 2022 (9) CT angio of the chest 02/27/2020 shows multiple hilar nodes consistent with prior granulomatous infection, multiple small pulmonary nodules, multiple punctate granulomas throughout the liver and spleen, nodular hepatic contour and areas of mottling possibly due to steatosis, 1.6 cm left adrenal nodule  (a) PET scan 03/05/2020 shows mildly to highly hypermetabolic adenopathy, the pulmonary nodules being mildly to not hypermetabolic, a large right hepatic liver lesion with an SUV of 13.2 and additional hypermetabolic right and left lobe foci, the adrenal lesion being hypermetabolic with an SUV of 16.  There are also peritoneal nodules and innumerable foci of bony involvement.  Incidental note was made of small heterogeneous foci of increased uptake in the cerebellum.  The CT images did not show gross abnormality in the visualized brain  (b) on 03/09/2020 CA 27-29 was 983.0, CEA 744.17, the CA 19-9 normal at 32  (c) liver biopsy 03/13/2020 confirms metastati carcinoma, estrogen receptor positive, progesterone and HER  2 negative, with an Mib-1 of 40%  (d) brain MRI 03/26/2020 shows dural involvement including a 1.0 cm dural-based cerebellar lesion  (10) paclitaxel started 03/30/2020,  (11) zoledronate started 03/19/2020, to be repeated every 12 weeks  (12) foundation 1 on the current cancer pending   PLAN: Keniya did moderately well with the paclitaxel and we are proceeding with 2 a second treatment today.  I do not think that is the cause of her symptoms.  I think she has cancer in multiple places including the peritoneum and that is irritating her bowels.  It is making it difficult for her to eat anything.  I offered her fluids daily if necessary, or certainly every other day but she told me she cannot get here more than once this week.  I am concerned that she still does not have oxygen at home.  We are going to try some lorazepam and sublingual ondansetron and see if things can helps her keep something down.  She will return for fluids later this week and she will see me again in 1 week.  If we are not  making any progress by then I am not sure how we can proceed with [facility].  She might need to be admitted for hydration and might end up needing placement.  She is likely to refuse this as she is again very independent and does not like to ask for help.  Otherwise the plan continues to be to treat and hope that we can get some rapid response so that her symptoms from the cancer improve  Total encounter time 25 minutes.*   Latoy Labriola, Virgie Dad, MD  04/06/20 6:30 PM Medical Oncology and Hematology [facility] [place], [place] [address] Tel. [phone]    Fax. [phone]   I, Wilburn Mylar, am acting as scribe for Dr. Virgie Dad. Anielle Headrick.  I, Lurline Del MD, have reviewed the above documentation for accuracy and completeness, and I agree with the above.   *Total Encounter Time as defined by the Centers for Medicare and Medicaid Services includes, in addition to the  face-to-face time of a patient visit (documented in the note above) non-face-to-face time: obtaining and reviewing outside history, ordering and reviewing medications, tests or procedures, care coordination (communications with other health care professionals or caregivers) and documentation in the medical record.

## 2020-04-06 NOTE — Progress Notes (Signed)
Pt discharged in no apparent distress. Pt left ambulatory without assistance. Pt aware of discharge instructions and verbalized understanding and had no further questions.  

## 2020-04-06 NOTE — Addendum Note (Signed)
Encounter addended by: [name], RN on: 04/06/2020 7:12 AM  Actions taken: Charge Capture section accepted

## 2020-04-06 NOTE — Telephone Encounter (Signed)
May treat today despite abnormal liver enzymes.

## 2020-04-06 NOTE — Progress Notes (Signed)
[facility] CSW Progress Note  Holiday representative met with patient to introduce self (pt met CSW [place] last week). Patient is stressed today as she has been having trouble keeping any food down. CSW provided supportive listening and followed-up with dietitian who is scheduled to speak with pt on Wednesday. Informed patient to be expecting a phone call. Patient's  sisters do not live locally (two in [place], one in [place]) but she does have a few friends who can help pick up groceries or other items that she needs.    Christeen Douglas , LCSW

## 2020-04-07 ENCOUNTER — Other Ambulatory Visit: Payer: Self-pay | Admitting: *Deleted

## 2020-04-08 ENCOUNTER — Telehealth: Payer: Self-pay | Admitting: Oncology

## 2020-04-08 ENCOUNTER — Other Ambulatory Visit: Payer: Self-pay

## 2020-04-08 ENCOUNTER — Inpatient Hospital Stay: Payer: Medicare PPO | Admitting: Nutrition

## 2020-04-08 ENCOUNTER — Inpatient Hospital Stay: Payer: Medicare PPO | Admitting: Oncology

## 2020-04-08 ENCOUNTER — Encounter: Payer: Self-pay | Admitting: General Practice

## 2020-04-08 ENCOUNTER — Inpatient Hospital Stay: Payer: Medicare PPO

## 2020-04-08 VITALS — BP 122/55 | HR 96 | Temp 98.1°F | Resp 17 | Wt 188.6 lb

## 2020-04-08 DIAGNOSIS — Z9012 Acquired absence of left breast and nipple: Secondary | ICD-10-CM | POA: Diagnosis not present

## 2020-04-08 DIAGNOSIS — C50912 Malignant neoplasm of unspecified site of left female breast: Secondary | ICD-10-CM | POA: Diagnosis not present

## 2020-04-08 DIAGNOSIS — C50812 Malignant neoplasm of overlapping sites of left female breast: Secondary | ICD-10-CM

## 2020-04-08 DIAGNOSIS — C787 Secondary malignant neoplasm of liver and intrahepatic bile duct: Secondary | ICD-10-CM | POA: Diagnosis not present

## 2020-04-08 DIAGNOSIS — C7951 Secondary malignant neoplasm of bone: Secondary | ICD-10-CM | POA: Diagnosis not present

## 2020-04-08 DIAGNOSIS — C7800 Secondary malignant neoplasm of unspecified lung: Secondary | ICD-10-CM

## 2020-04-08 DIAGNOSIS — Z170 Estrogen receptor positive status [ER+]: Secondary | ICD-10-CM | POA: Diagnosis not present

## 2020-04-08 DIAGNOSIS — D649 Anemia, unspecified: Secondary | ICD-10-CM | POA: Diagnosis not present

## 2020-04-08 DIAGNOSIS — M8580 Other specified disorders of bone density and structure, unspecified site: Secondary | ICD-10-CM | POA: Diagnosis not present

## 2020-04-08 DIAGNOSIS — Z5111 Encounter for antineoplastic chemotherapy: Secondary | ICD-10-CM | POA: Diagnosis not present

## 2020-04-08 DIAGNOSIS — Z95828 Presence of other vascular implants and grafts: Secondary | ICD-10-CM

## 2020-04-08 DIAGNOSIS — E860 Dehydration: Secondary | ICD-10-CM | POA: Diagnosis not present

## 2020-04-08 MED ORDER — SODIUM CHLORIDE 0.9 % IV SOLN
INTRAVENOUS | Status: DC
  Filled 2020-04-08 (×2): qty 250

## 2020-04-08 MED ORDER — ONDANSETRON HCL 4 MG/2ML IJ SOLN
8.0000 mg | Freq: Once | INTRAMUSCULAR | Status: AC
  Administered 2020-04-08: 8 mg via INTRAVENOUS

## 2020-04-08 MED ORDER — ONDANSETRON HCL 4 MG/2ML IJ SOLN
INTRAMUSCULAR | Status: AC
  Filled 2020-04-08: qty 4

## 2020-04-08 NOTE — Patient Instructions (Signed)
Rehydration, Adult Rehydration is the replacement of body fluids, salts, and minerals (electrolytes) that are lost during dehydration. Dehydration is when there is not enough water or other fluids in the body. This happens when you lose more fluids than you take in. Common causes of dehydration include:  Not drinking enough fluids. This can occur when you are ill or doing activities that require a lot of energy, especially in hot weather.  Conditions that cause loss of water or other fluids, such as diarrhea, vomiting, sweating, or urinating a lot.  Other illnesses, such as fever or infection.  Certain medicines, such as those that remove excess fluid from the body (diuretics). Symptoms of mild or moderate dehydration may include thirst, dry lips and mouth, and dizziness. Symptoms of severe dehydration may include increased heart rate, confusion, fainting, and not urinating. For severe dehydration, you may need to get fluids through an IV at the hospital. For mild or moderate dehydration, you can usually rehydrate at home by drinking certain fluids as told by your health care provider. What are the risks? Generally, rehydration is safe. However, taking in too much fluid (overhydration) can be a problem. This is rare. Overhydration can cause an electrolyte imbalance, kidney failure, or a decrease in salt (sodium) levels in the body. Supplies needed You will need an oral rehydration solution (ORS) if your health care provider tells you to use one. This is a drink to treat dehydration. It can be found in pharmacies and retail stores. How to rehydrate Fluids Follow instructions from your health care provider for rehydration. The kind of fluid and the amount you should drink depend on your condition. In general, you should choose drinks that you prefer.  If told by your health care provider, drink an ORS. ? Make an ORS by following instructions on the package. ? Start by drinking small amounts,  about  cup (120 mL) every 5-10 minutes. ? Slowly increase how much you drink until you have taken the amount recommended by your health care provider.  Drink enough clear fluids to keep your urine pale yellow. If you were told to drink an ORS, finish it first, then start slowly drinking other clear fluids. Drink fluids such as: ? Water. This includes sparkling water and flavored water. Drinking only water can lead to having too little sodium in your body (hyponatremia). Follow the advice of your health care provider. ? Water from ice chips you suck on. ? Fruit juice with water you add to it (diluted). ? Sports drinks. ? Hot or cold herbal teas. ? Broth-based soups. ? Milk or milk products. Food Follow instructions from your health care provider about what to eat while you rehydrate. Your health care provider may recommend that you slowly begin eating regular foods in small amounts.  Eat foods that contain a healthy balance of electrolytes, such as bananas, oranges, potatoes, tomatoes, and spinach.  Avoid foods that are greasy or contain a lot of sugar. In some cases, you may get nutrition through a feeding tube that is passed through your nose and into your stomach (nasogastric tube, or NG tube). This may be done if you have uncontrolled vomiting or diarrhea.   Beverages to avoid Certain beverages may make dehydration worse. While you rehydrate, avoid drinking alcohol.   How to tell if you are recovering from dehydration You may be recovering from dehydration if:  You are urinating more often than before you started rehydrating.  Your urine is pale yellow.  Your energy level   improves.  You vomit less frequently.  You have diarrhea less frequently.  Your appetite improves or returns to normal.  You feel less dizzy or less light-headed.  Your skin tone and color start to look more normal. Follow these instructions at home:  Take over-the-counter and prescription medicines only  as told by your health care provider.  Do not take sodium tablets. Doing this can lead to having too much sodium in your body (hypernatremia). Contact a health care provider if:  You continue to have symptoms of mild or moderate dehydration, such as: ? Thirst. ? Dry lips. ? Slightly dry mouth. ? Dizziness. ? Dark urine or less urine than normal. ? Muscle cramps.  You continue to vomit or have diarrhea. Get help right away if you:  Have symptoms of dehydration that get worse.  Have a fever.  Have a severe headache.  Have been vomiting and the following happens: ? Your vomiting gets worse or does not go away. ? Your vomit includes blood or green matter (bile). ? You cannot eat or drink without vomiting.  Have problems with urination or bowel movements, such as: ? Diarrhea that gets worse or does not go away. ? Blood in your stool (feces). This may cause stool to look black and tarry. ? Not urinating, or urinating only a small amount of very dark urine, within 6-8 hours.  Have trouble breathing.  Have symptoms that get worse with treatment. These symptoms may represent a serious problem that is an emergency. Do not wait to see if the symptoms will go away. Get medical help right away. Call your local emergency services (911 in the [place] Do not drive yourself to the hospital. Summary  Rehydration is the replacement of body fluids and minerals (electrolytes) that are lost during dehydration.  Follow instructions from your health care provider for rehydration. The kind of fluid and amount you should drink depend on your condition.  Slowly increase how much you drink until you have taken the amount recommended by your health care provider.  Contact your health care provider if you continue to show signs of mild or moderate dehydration. This information is not intended to replace advice given to you by your health care provider. Make sure you discuss any questions you have with  your health care provider. Document Revised: 04/17/2019 Document Reviewed: 02/25/2019 Elsevier Patient Education  2021 [employer]  

## 2020-04-08 NOTE — Progress Notes (Signed)
[place]  [phone]) [phone] Fax:(336) [phone]    ID: Brooke [name] DOB: [birth year]  MR#: [id number]  DBZ#:[id number]  Patient Care Team: [name], MD as PCP - General (Family Medicine) Rolm Bookbinder, MD as Attending Physician (Dermatology) Jagdeep Ancheta, Virgie Dad, MD as Consulting Physician (Oncology) Kyung Rudd, MD as Consulting Physician (Radiation Oncology) Jovita Kussmaul, MD as Consulting Physician (General Surgery) Irene Limbo, MD as Consulting Physician (Plastic Surgery) Delice Bison, Charlestine Massed, NP as Nurse Practitioner (Hematology and Oncology) [name], MD as Consulting Physician (Obstetrics and Gynecology) Laurey Morale, MD as Consulting Physician (Family Medicine) OTHER MD:   CHIEF COMPLAINT: Estrogen receptor positive breast cancer (s/p left mastectomy) now widely metastatic  CURRENT TREATMENT: Weekly paclitaxel   INTERVAL HISTORY: Keilin returns today for follow-up and treatment of her now widely disseminated breast cancer, which is weakly estrogen receptor positive.    She started weekly paclitaxel on 03/31/2019. Today is day 3 cycle 2.  She tolerated the first dose without any obvious side effects and particularly there was no vomiting although she is not able to take anything by mouth she says because anything she takes in, even water, causes her to have loose bowel movements and to vomit.  She was brought in today for fluids to prevent dehydration and for further cancer through our nutritionist  She also started zoledronate on 03/19/2020, to be repeated every 12 weeks.  This caused her severe bone pain and she is delighted that this will only happen every 3 months.  REVIEW OF SYSTEMS: Anne-Marie seems to me to be more in control of her feelings today.  She still is having a great deal of difficulty keeping anything down including the sublingual Zofran.  It is not clear how much fluid she is taking.  She is having some ankle  edema.  This is bilateral.  She denies unusual headaches, visual changes, cough phlegm production or pleurisy.  She has very poor access and her right upper extremity was "traumatized" by attempts to find a very   COVID 19 VACCINATION STATUS: fully vaccinated [employer]), with booster 12/28/2019    HISTORY OF CURRENT ILLNESS: From the original intake note:  Ricca herself palpated a change in her left breast and brought this to medical attention.  On 01/18/2017 she underwent bilateral diagnostic mammography with tomography and bilateral breast ultrasonography at [facility].  The breast density was category B.  At the most recent exam was from December 2016.  In the right breast upper outer quadrant there was a 1 cm irregular mass associated with punctate calcification.  By ultrasound this was not well-defined, and it was biopsied on 01/26/2017 with tomographic guidance this showed a complex sclerosing lesion with the usual ductal hyperplasia.  The right axilla was sonographically  On the left mammography showed a 4 cm dense mass with indistinct margins in the upper inner quadrant.  Ultrasound defined a 3 cm irregular mass in the upper inner quadrant of the left breast which was palpable.  There was an abnormal lymph node in the left axilla.  Biopsy of the left mass and abnormal lymph node ([id number], on 01/26/2017) showed both to be involved by invasive ductal carcinoma, with some lobular features but E-cadherin positive, estrogen and progesterone receptors both 100% positive, with strong staining intensity, with MIB-1 between 15-20%.  There was no HER-2 amplification, the signals ratio being 1.31/1.58 and the number per cell 2.30/2.05  The patient's subsequent history is as detailed below.   PAST  MEDICAL HISTORY: Past Medical History:  Diagnosis Date  . Anemia   . Anxiety   . Arthritis   . ASCUS of cervix with negative high risk HPV 12/2018  . Bilateral dry eyes   . Breast cancer, left breast  (HCC)   . Family history of breast cancer 02/02/2017  . Family history of breast cancer   . Family history of colon cancer 02/02/2017  . Family history of colon cancer   . Family history of prostate cancer   . Family history of prostate cancer in father 02/02/2017  . Family history of uterine cancer   . Fibroid   . History of kidney stones   . Hyperlipidemia   . Hypertension   . Melanoma of upper arm (HCC) 2009   RIGHT ARM   . Obesity (BMI 30.0-34.9)   . Osteopenia 09/2017   T score -1.9 max 8% / 0.8% statistically significant decline at right and left hip stable at the spine  . PONV (postoperative nausea and vomiting)   . Squamous carcinoma     of the skin  . UTI (urinary tract infection)   . Varicose veins of both lower extremities   . Vitamin D deficiency 07/2009   LOW VITAMIN D 29  . Vitamin deficiency 07/2008   VITAMIN D LOW 23    PAST SURGICAL HISTORY: Past Surgical History:  Procedure Laterality Date  . ABDOMINAL SURGERY  1975   ovairan cyst   . APPENDECTOMY    . BREAST LUMPECTOMY WITH RADIOACTIVE SEED LOCALIZATION Right 09/25/2017   Procedure: RIGHT BREAST LUMPECTOMY WITH RADIOACTIVE SEED LOCALIZATION;  Surgeon: Griselda Miner, MD;  Location: [facility] OR;  Service: General;  Laterality: Right;  . BREAST SURGERY     bio  . CHOLECYSTECTOMY    . DERMOID CYST  EXCISION    . DILATATION & CURETTAGE/HYSTEROSCOPY WITH MYOSURE N/A 04/23/2019   Procedure: DILATATION & CURETTAGE/HYSTEROSCOPY WITH MYOSURE;  Surgeon: Genia Del, MD;  Location: [facility];  Service: Gynecology;  Laterality: N/A;  request to follow at 9:30am in [facility] block requests 45 minutes  . DILATION AND CURETTAGE OF UTERUS  1998  . GALLBLADDER SURGERY    . HYSTEROSCOPY    . insertion  port a cath  03/02/2016  . MASTECTOMY MODIFIED RADICAL Left 09/25/2017   Procedure: LEFT MODIFIED RADICAL MASTECTOMY;  Surgeon: Griselda Miner, MD;  Location: [facility] OR;  Service: General;  Laterality: Left;   . PORTACATH PLACEMENT N/A 03/02/2017   Procedure: INSERTION PORT-A-CATH;  Surgeon: Griselda Miner, MD;  Location: [facility] OR;  Service: General;  Laterality: N/A;  . SKIN BIOPSY    . SKIN CANCER EXCISION  2010,2011   2010-RIGHT ARM, 2011-LEFT LOWER LEG.-DR Venancio Poisson DERM. DR. Irene Limbo IS HER DERM SURGEON.    FAMILY HISTORY Family History  Problem Relation Age of Onset  . Diabetes Father   . Hypertension Father   . Heart disease Father   . Prostate cancer Father 12  . Arthritis Father   . Breast cancer Sister 42       again at age 44, GT results unk  . Skin cancer Sister   . Colon cancer Paternal Grandfather 21  . Hypertension Mother   . Heart failure Mother   . Uterine cancer Sister 74       stage IV  . Breast cancer Cousin        mat first cousin dx under 15  The patient's father was diagnosed with prostate cancer in  his 61s and died at age 35 from complications of that disease.  The patient's mother died at age 75 with congestive heart failure.  The patient had no brothers, 3 sisters.  One sister had breast cancer at age 55 and again at age 13.  She has been genetically tested and was found to be BRCA not mutated in addition a paternal grandfather had colon cancer in his 15s.  There is no history of ovarian cancer in the family to the patient's knowledge.   GYNECOLOGIC HISTORY:  Patient's last menstrual period was 03/18/1997. Menarche age 2. The patient is GX P0.  She stopped having periods in 1999 and took hormone replacement approximately 5 years.   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  She is now retired.  She is single and lives alone with no pets.    ADVANCED DIRECTIVES: Not in place   HEALTH MAINTENANCE: Social History   Tobacco Use  . Smoking status: Never Smoker  . Smokeless tobacco: Never Used  Vaping Use  . Vaping Use: Never used  Substance  Use Topics  . Alcohol use: No  . Drug use: No    Colonoscopy: Never  PAP: 12/2019, negative  Bone density: 2017/osteopenia   Allergies  Allergen Reactions  . Neosporin [Neomycin-Bacitracin Zn-Polymyx] Swelling, Rash and Other (See Comments)    Blisters  . Bactrim [Sulfamethoxazole-Trimethoprim]     Made her feel very sick  . Benzalkonium Chloride Other (See Comments)    Redness (Pt is unaware)  It works by killing microorganisms and inhibiting their future growth, and for this reason frequently appears as an ingredient in antibacterial hand wipes, antiseptic creams and anti-itch ointments.  . Latex Rash    Current Outpatient Medications  Medication Sig Dispense Refill  . dexamethasone (DECADRON) 4 MG tablet Take 1 tablet (4 mg total) by mouth daily. 60 tablet 1  . LORazepam (ATIVAN) 0.5 MG tablet Place 1 tablet (0.5 mg total) under the tongue every 8 (eight) hours as needed for anxiety. PT to use for nausea as needed 30 tablet 0  . losartan (COZAAR) 25 MG tablet TAKE 1 TABLET(25 MG) BY MOUTH DAILY (Patient taking differently: Taking 1/2 tablet) 90 tablet 1  . ondansetron (ZOFRAN-ODT) 4 MG disintegrating tablet Take 1 tablet (4 mg total) by mouth every 8 (eight) hours as needed for nausea or vomiting. 20 tablet 0  . prochlorperazine (COMPAZINE) 10 MG tablet Take 1 tablet (10 mg total) by mouth every 6 (six) hours as needed (Nausea or vomiting). 30 tablet 1  . Propylene Glycol 0.6 % SOLN Place 1 drop into both eyes daily.    . vitamin C (ASCORBIC ACID) 500 MG tablet Take 1,000 mg by mouth daily.     . vitamin E 600 UNIT capsule Take 400 Units by mouth 2 (two) times a week.      No current facility-administered medications for this visit.   Facility-Administered Medications Ordered in Other Visits  Medication Dose Route Frequency Provider Last Rate Last Admin  . 0.9 %  sodium chloride infusion   Intravenous Continuous Celeste Tavenner, Virgie Dad, MD 500 mL/hr at 04/08/20 1236 New Bag at  04/08/20 1236  . ondansetron (ZOFRAN) injection 8 mg  8 mg Intravenous Once Gargi Berch, Virgie Dad, MD         OBJECTIVE:  white woman examined in a recliner There were no vitals filed for this visit.   There  is no height or weight on file to calculate BMI.   Wt Readings from Last 3 Encounters:  04/08/20 188 lb 9.6 oz (85.5 kg)  04/06/20 188 lb 6.4 oz (85.5 kg)  04/02/20 189 lb 12.8 oz (86.1 kg)  ECOG FS:1 - Symptomatic but completely ambulatory   For vitals associated with the 04/08/2020 visit please see the symptom management area flowsheet   LAB RESULTS:  CMP     Component Value Date/Time   NA 136 04/06/2020 1147   NA 141 03/03/2017 1358   K 3.7 04/06/2020 1147   K 3.3 (L) 03/03/2017 1358   CL 106 04/06/2020 1147   CO2 15 (L) 04/06/2020 1147   CO2 28 03/03/2017 1358   GLUCOSE 113 (H) 04/06/2020 1147   GLUCOSE 107 03/03/2017 1358   BUN 11 04/06/2020 1147   BUN 12.7 03/03/2017 1358   CREATININE 0.82 04/06/2020 1147   CREATININE 0.83 02/19/2020 0936   CREATININE 0.9 03/03/2017 1358   CALCIUM 8.2 (L) 04/06/2020 1147   CALCIUM 9.4 03/03/2017 1358   PROT 6.1 (L) 04/06/2020 1147   PROT 6.5 03/03/2017 1358   ALBUMIN 3.4 (L) 04/06/2020 1147   ALBUMIN 3.7 03/03/2017 1358   AST 129 (H) 04/06/2020 1147   AST 43 (H) 09/12/2018 1354   AST 23 03/03/2017 1358   ALT 45 (H) 04/06/2020 1147   ALT 84 (H) 09/12/2018 1354   ALT 25 03/03/2017 1358   ALKPHOS 300 (H) 04/06/2020 1147   ALKPHOS 71 03/03/2017 1358   BILITOT 1.3 (H) 04/06/2020 1147   BILITOT 0.6 09/12/2018 1354   BILITOT 0.52 03/03/2017 1358   GFRNONAA >60 04/06/2020 1147   GFRNONAA >60 09/12/2018 1354   GFRAA >60 06/12/2019 1401   GFRAA >60 09/12/2018 1354    No results found for: TOTALPROTELP, ALBUMINELP, A1GS, A2GS, BETS, BETA2SER, GAMS, MSPIKE, SPEI  No results found for: Nils Pyle, [facility]  Lab Results  Component Value Date   WBC 3.3 (L) 04/06/2020   NEUTROABS 2.0 04/06/2020   HGB 12.0  04/06/2020   HCT 36.6 04/06/2020   MCV 86.3 04/06/2020   PLT 151 04/06/2020      Chemistry      Component Value Date/Time   NA 136 04/06/2020 1147   NA 141 03/03/2017 1358   K 3.7 04/06/2020 1147   K 3.3 (L) 03/03/2017 1358   CL 106 04/06/2020 1147   CO2 15 (L) 04/06/2020 1147   CO2 28 03/03/2017 1358   BUN 11 04/06/2020 1147   BUN 12.7 03/03/2017 1358   CREATININE 0.82 04/06/2020 1147   CREATININE 0.83 02/19/2020 0936   CREATININE 0.9 03/03/2017 1358      Component Value Date/Time   CALCIUM 8.2 (L) 04/06/2020 1147   CALCIUM 9.4 03/03/2017 1358   ALKPHOS 300 (H) 04/06/2020 1147   ALKPHOS 71 03/03/2017 1358   AST 129 (H) 04/06/2020 1147   AST 43 (H) 09/12/2018 1354   AST 23 03/03/2017 1358   ALT 45 (H) 04/06/2020 1147   ALT 84 (H) 09/12/2018 1354   ALT 25 03/03/2017 1358   BILITOT 1.3 (H) 04/06/2020 1147   BILITOT 0.6 09/12/2018 1354   BILITOT 0.52 03/03/2017 1358       No results found for: LABCA2  No components found for: WUJWJX914  No results for input(s): INR in the last 168 hours.  No results found for: [facility]  Lab Results  Component Value Date   NWG956 32 03/06/2020    No results found for: OZH086  No results found for: XCZ919  Lab Results  Component Value Date   CA2729 983.0 (H) 03/06/2020    No components found for: HGQUANT  Lab Results  Component Value Date   CEA1 744.17 (H) 03/06/2020   /  CEA ([facility])  Date Value Ref Range Status  03/06/2020 744.17 (H) 0.00 - 5.00 ng/mL Final    Comment:    (NOTE) This test was performed using [employer]'s Chemiluminescent Microparticle Immunoassay. Values obtained from different assay methods cannot be used interchangeably. Please note that 5-10% of patients who smoke may see CEA levels up to 6.9 ng/mL. Performed at [facility], [address], [place], [place] [address]      No results found for: AFPTUMOR  No results found for: CHROMOGRNA  No results  found for: HGBA, HGBA2QUANT, HGBFQUANT, HGBSQUAN (Hemoglobinopathy evaluation)   No results found for: LDH  Lab Results  Component Value Date   IRON 52 02/19/2020   TIBC 327 02/19/2020   IRONPCTSAT 16 02/19/2020   (Iron and TIBC)  Lab Results  Component Value Date   FERRITIN 298 (H) 02/19/2020    Urinalysis    Component Value Date/Time   COLORURINE DARK YELLOW 02/19/2020 0947   APPEARANCEUR CLOUDY (A) 02/19/2020 0947   LABSPEC 1.018 02/19/2020 0947   PHURINE 6.0 02/19/2020 0947   GLUCOSEU NEGATIVE 02/19/2020 0947   GLUCOSEU NEGATIVE 11/05/2012 1559   HGBUR 1+ (A) 02/19/2020 0947   BILIRUBINUR neg 08/10/2018 1529   KETONESUR 2+ (A) 02/19/2020 0947   PROTEINUR 2+ (A) 02/19/2020 0947   UROBILINOGEN 0.2 08/10/2018 1529   UROBILINOGEN 0.2 11/05/2012 1559   NITRITE neg 08/10/2018 1529   NITRITE NEGATIVE 05/29/2015 1113   LEUKOCYTESUR Negative 08/10/2018 1529    STUDIES: MR Brain W Wo Contrast  Result Date: 03/26/2020 CLINICAL DATA:  Malignant neoplasm of overlapping sites of left breast in female, estrogen receptor positive. Breast cancer, staging; evaluate for brain metastases or meningeal spread. EXAM: MRI HEAD WITHOUT AND WITH CONTRAST TECHNIQUE: Multiplanar, multiecho pulse sequences of the brain and surrounding structures were obtained without and with intravenous contrast. CONTRAST:  39mL GADAVIST GADOBUTROL 1 MMOL/ML IV SOLN COMPARISON:  Head CT 03/05/2020. FINDINGS: Brain: Mild intermittent motion degradation. Cerebral volume is normal for age. There is pachymeningeal dural thickening and enhancement overlying the right greater than left cerebral convexities. This measures up to 8 mm in thickness overlying the right cerebral convexity (series 17, image 23). Findings are compatible with dural-based metastatic disease. Additionally, there is nodular dural based metastatic disease along the mid falx measuring 4.5 x 1.0 cm (AP X TV). 1 cm enhancing lesion along the left  cerebellum which appears dural based (series 16, image 37). Mild surrounding parenchymal edema. No other parenchymal lesions are identified. No significant cerebral white matter disease. Small chronic infarct within the right cerebellum. There is no acute infarct. No chronic intracranial blood products. No extra-axial fluid collection. No midline shift. Vascular: Expected proximal arterial flow voids. Skull and upper cervical spine: Inhomogeneous T1 hypointense signal throughout the calvarium. Sinuses/Orbits: Visualized orbits show no acute finding. Trace ethmoid and right maxillary sinus mucosal thickening. Other: Numerous nonspecific small lesions within the bilateral scalp and visualized neck. These results will be called to the ordering clinician or representative by the Radiologist Assistant, and communication documented in the PACS or [employer]. IMPRESSION: Pachymeningeal dural thickening and enhancement overlying the right greater than left cerebral convexities measuring up to 8 mm in thickness. Findings are compatible with dural-based metastatic  disease. Additional nodular dural-based metastatic disease along the mid falx measuring 4.5 x 1.0 cm (AP x TV). 1 cm enhancing lesion along the left cerebellum which appears dural-based. This may reflect additional dural-based metastatic disease or a small meningioma. Mild parenchymal edema within the underlying left cerebellum. Inhomogeneous T1 hypointense signal within the bilateral calvarium, highly suspicious for calvarial metastases. Numerous small nonspecific lesions within the bilateral scalp and visualized neck. Direct visualization recommended. Small chronic lacunar infarct within the right cerebellar hemisphere. Electronically Signed   By: Kellie Simmering DO   On: 03/26/2020 14:52   [place] CORE BIOPSY (LIVER)  Result Date: 03/13/2020 INDICATION: 70 year old female with hepatic metastatic lesions of uncertain primary. EXAM: ULTRASOUND GUIDED LIVER LESION  BIOPSY COMPARISON:  None. MEDICATIONS: None ANESTHESIA/SEDATION: Fentanyl 50 mcg IV; Versed 1 mg IV Total Moderate Sedation time:  20 minutes. The patient's level of consciousness and vital signs were monitored continuously by radiology nursing throughout the procedure under my direct supervision. COMPLICATIONS: None immediate. PROCEDURE: Informed written consent was obtained from the patient after a discussion of the risks, benefits and alternatives to treatment. The patient understands and consents the procedure. A timeout was performed prior to the initiation of the procedure. Ultrasound scanning was performed of the right upper abdominal quadrant demonstrates heterogeneously hypoechoic rounded mass in the left lobe measuring up to approximately 2.0 cm. The left lobe mass was selected for biopsy and the procedure was planned. The subxiphoid abdominal area was prepped and draped in the usual sterile fashion. The overlying soft tissues were anesthetized with 1% lidocaine with epinephrine. A 17 gauge, 6.8 cm co-axial needle was advanced into a peripheral aspect of the lesion. This was followed by 3 core biopsies with an 18 gauge core device under direct ultrasound guidance. The coaxial needle tract was embolized with a small amount of Gel-Foam slurry and superficial hemostasis was obtained with manual compression. Post procedural scanning was negative for definitive area of hemorrhage or additional complication. A dressing was placed. The patient tolerated the procedure well without immediate post procedural complication. IMPRESSION: Technically successful ultrasound guided core needle biopsy of left lobe hepatic mass, presumed metastasis. Ruthann Cancer, MD [facility] Electronically Signed   By: Ruthann Cancer MD   On: 03/13/2020 17:05     ELIGIBLE FOR AVAILABLE RESEARCH PROTOCOL: no   ASSESSMENT: 70 y.o. [place] woman status post bilateral biopsies  01/26/2017, showing  (1) in the right breast, a complex sclerosing lesion, status post lumpectomy 09/25/2017, with no malignancy identified.  (2) in the left breast, a cT2 pN1 invasive ductal carcinoma (with some lobular features but E-cadherin positive), grade 2, estrogen and progesterone receptor positive, HER-2 not amplified, with an MIB-1 of 15-20%  (a) breast MRI 02/04/2017 suggests a T3 N1 tumor  (3) started neoadjuvant cyclophosphamide/docetaxel 03/03/2017, discontinued after 1 cycle with very poor tolerance  (a) started cyclophosphamide/methotrexate/fluorouracil (CMF) 03/28/2017, repeated x7 cycles, last dose 08/01/2017  (4) status post left modified radical mastectomy 09/25/17 for a ypT5 ypN2, stage IIIA invasive ductal carcinoma, grade 2, again estrogen and progesterone receptor positive, HER-2 not amplified  (5) adjuvant radiation completed 12/21/2017  (a) capecitabine sensitization tolerated only the initial week of radiation (b) Site/dose: The patient initially received a dose of 50.4 Gy in 28 fractions to the left chest wall and left supraclavicular region. This was delivered using a 3-D conformal, 4 field technique. The patient then received a boost to the mastectomy scar. This delivered an additional 10 Gy in 5  fractions using an en face electron field. The total dose was 60.4 Gy.  ( 6) tamoxifen started 03/09/2018, discontinued January 2022 with metastatic progression  (7) genetics testing 12/12/2017 through the Multi-Gene Panel offered by [facility] found no deleterious mutations in AIP, ALK, APC, ATM, AXIN2,BAP1,  BARD1, BLM, BMPR1A, BRCA1, BRCA2, BRIP1, CASR, CDC73, CDH1, CDK4, CDKN1B, CDKN1C, CDKN2A (p14ARF), CDKN2A (p16INK4a), CEBPA, CHEK2, CTNNA1, DICER1, DIS3L2, EGFR (c.2369C>T, p.Thr790Met variant only), EPCAM (Deletion/duplication testing only), FH, FLCN, GATA2, GPC3, GREM1 (Promoter region deletion/duplication testing only), HOXB13 (c.251G>A, p.Gly84Glu), HRAS, KIT, MAX,  MEN1, MET, MITF (c.952G>A, p.Glu318Lys variant only), MLH1, MSH2, MSH3, MSH6, MUTYH, NBN, NF1, NF2, NTHL1, PALB2, PDGFRA, PHOX2B, PMS2, POLD1, POLE, POT1, PRKAR1A, PTCH1, PTEN, RAD50, RAD51C, RAD51D, RB1, RECQL4, RET, RUNX1, SDHAF2, SDHA (sequence changes only), SDHB, SDHC, SDHD, SMAD4, SMARCA4, SMARCB1, SMARCE1, STK11, SUFU, TERC, TERT, TMEM127, TP53, TSC1, TSC2, VHL, WRN and WT1.   (8) Foundation 1 testing shows stable microsatellite status and 1 mutation/ Mb, TP53 mutations x2  (a) PD-L1 dated July 2019 negative  METASTATIC DISEASE: January 2022 (9) CT angio of the chest 02/27/2020 shows multiple hilar nodes consistent with prior granulomatous infection, multiple small pulmonary nodules, multiple punctate granulomas throughout the liver and spleen, nodular hepatic contour and areas of mottling possibly due to steatosis, 1.6 cm left adrenal nodule  (a) PET scan 03/05/2020 shows mildly to highly hypermetabolic adenopathy, the pulmonary nodules being mildly to not hypermetabolic, a large right hepatic liver lesion with an SUV of 13.2 and additional hypermetabolic right and left lobe foci, the adrenal lesion being hypermetabolic with an SUV of 16.  There are also peritoneal nodules and innumerable foci of bony involvement.  Incidental note was made of small heterogeneous foci of increased uptake in the cerebellum.  The CT images did not show gross abnormality in the visualized brain  (b) on 03/09/2020 CA 27-29 was 983.0, CEA 744.17, the CA 19-9 normal at 32  (c) liver biopsy 03/13/2020 confirms metastati carcinoma, estrogen receptor positive, progesterone and HER 2 negative, with an Mib-1 of 40%  (d) brain MRI 03/26/2020 shows dural involvement including a 1.0 cm dural-based cerebellar lesion  (10) paclitaxel started 03/30/2020, to be repeated weekly as tolerated  (11) zoledronate started 03/19/2020, to be repeated every 12 weeks  (12) foundation 1 on the current cancer pending   PLAN: Cady  appears to be tolerating the Taxol moderately well.  We are hoping for an early response.  I am following the "bump" at the nape of her neck which seems to me to be slightly smaller, certainly no larger.  If we are going to continue treatment she will need better access.  Today we discussed the PICC line.  She understands this needs to be kept dry, needs to be flushed 3 times a week, and needs to have the dressing changed weekly.  We can assist with all that.  After much thought she decided that she would go for it so we will try to schedule that for her.  She will benefit from repeat fluids this Friday, 04/10/2020, and possibly also the following day.  She is already scheduled to see me 04/14/2020 and if she does not get a third dose of Taxol that day she will receive IV fluids then.  Total encounter time 20 minutes.*   Yaniah Thiemann, Virgie Dad, MD  04/08/20 12:57 PM Medical Oncology and Hematology [facility] [place], [place] [address] Tel. [phone]    Fax. [phone]   I, Wilburn Mylar,  am acting as scribe for Dr. Sarajane Jews C. Aissata Wilmore.  I, Lurline Del MD, have reviewed the above documentation for accuracy and completeness, and I agree with the above.   *Total Encounter Time as defined by the Centers for Medicare and Medicaid Services includes, in addition to the face-to-face time of a patient visit (documented in the note above) non-face-to-face time: obtaining and reviewing outside history, ordering and reviewing medications, tests or procedures, care coordination (communications with other health care professionals or caregivers) and documentation in the medical record.

## 2020-04-08 NOTE — Progress Notes (Signed)
[place] Spiritual Care Note  Referred by Pamala Hurry Neff/RD for pastoral listing and emotional support. Met Brooke [name] in [facility], bringing a "pick-me-up package" including comfort items such as cosmetics, fuzzy socks, and a prayer shawl as a tangible sign of comfort and encouragement. Also brought a collection of "mindfulness moments" cards with simple tools for interrupting distress and initiating self-soothing. Provided pastoral listening, normalization of feelings, emotional support, and introduction to mindfulness exercises.  Brooke [name] was very appreciative of visit and support, remembering me from our first encounter by voicemail at the beginning of her cancer experience. We plan to follow up when she is on campus again to talk in more detail, and she has my direct dial number as well.   [place], [place], [facility] Pager [phone] Voicemail [phone]

## 2020-04-08 NOTE — Progress Notes (Signed)
Nutrition follow-up completed with patient during IV fluids in symptom management clinic.  Patient receiving treatment for breast cancer with widespread metastases Weight is stable at 188.6 pounds. Patient reports she felt the best she has ever felt Monday night after receiving her treatment, IV Zofran, and additional fluids. Continues to report an intolerance to food.  States she only took one of the medications one time yesterday. She can't remember which one she took. Reports diarrhea continues however has only one loose stool daily. Reports abdominal pain continues. Reports when food or liquid enters into her esophagus she feels as if she could vomit. Noted some swelling around her ankles.  Nutrition diagnosis: Intended weight loss stable.  Intervention: I provided supportive listening. Educated patient on appropriate foods and liquids to try and how to consume these. Encourage patient to try to relax and take several deep breaths after trying foods.  I encouraged only bites and sips initially. Explained why it is important for her to take medications as prescribed. Referral made to our chaplain to provide emotional support and coping tools as patient continues to be very tearful secondary to her diagnosis.  Monitoring, evaluation, goals: Patient will tolerate increased calories and protein to improve quality of life.  Next visit: To be scheduled.  **Disclaimer: This note was dictated with voice recognition software. Similar sounding words can inadvertently be transcribed and this note may contain transcription errors which may not have been corrected upon publication of note.**

## 2020-04-08 NOTE — Telephone Encounter (Signed)
Scheduled appts per 2/7 los. Pt confirmed appt dates and times.

## 2020-04-09 ENCOUNTER — Telehealth: Payer: Self-pay | Admitting: Oncology

## 2020-04-09 ENCOUNTER — Other Ambulatory Visit (HOSPITAL_COMMUNITY): Payer: Medicare PPO

## 2020-04-09 ENCOUNTER — Other Ambulatory Visit: Payer: Self-pay | Admitting: Student

## 2020-04-09 NOTE — Telephone Encounter (Signed)
Appts requested in 2/9 los were already made by charge nurse.

## 2020-04-10 ENCOUNTER — Other Ambulatory Visit: Payer: Self-pay | Admitting: Oncology

## 2020-04-10 ENCOUNTER — Other Ambulatory Visit: Payer: Self-pay | Admitting: Adult Health

## 2020-04-10 ENCOUNTER — Other Ambulatory Visit: Payer: Self-pay

## 2020-04-10 ENCOUNTER — Ambulatory Visit (HOSPITAL_COMMUNITY)
Admission: RE | Admit: 2020-04-10 | Discharge: 2020-04-10 | Disposition: A | Discharge status: Home / Self Care | Payer: Medicare PPO | Source: Ambulatory Visit | Attending: Oncology | Admitting: Oncology

## 2020-04-10 ENCOUNTER — Inpatient Hospital Stay: Payer: Medicare PPO

## 2020-04-10 VITALS — BP 133/57 | HR 97 | Temp 97.8°F | Resp 20

## 2020-04-10 DIAGNOSIS — Z95828 Presence of other vascular implants and grafts: Secondary | ICD-10-CM

## 2020-04-10 DIAGNOSIS — E860 Dehydration: Secondary | ICD-10-CM | POA: Diagnosis not present

## 2020-04-10 DIAGNOSIS — C787 Secondary malignant neoplasm of liver and intrahepatic bile duct: Secondary | ICD-10-CM

## 2020-04-10 DIAGNOSIS — R110 Nausea: Secondary | ICD-10-CM

## 2020-04-10 DIAGNOSIS — C7800 Secondary malignant neoplasm of unspecified lung: Secondary | ICD-10-CM

## 2020-04-10 DIAGNOSIS — C7951 Secondary malignant neoplasm of bone: Secondary | ICD-10-CM | POA: Diagnosis not present

## 2020-04-10 DIAGNOSIS — Z452 Encounter for adjustment and management of vascular access device: Secondary | ICD-10-CM

## 2020-04-10 DIAGNOSIS — Z5111 Encounter for antineoplastic chemotherapy: Secondary | ICD-10-CM | POA: Diagnosis not present

## 2020-04-10 DIAGNOSIS — D649 Anemia, unspecified: Secondary | ICD-10-CM | POA: Diagnosis not present

## 2020-04-10 DIAGNOSIS — Z9012 Acquired absence of left breast and nipple: Secondary | ICD-10-CM | POA: Diagnosis not present

## 2020-04-10 DIAGNOSIS — M8580 Other specified disorders of bone density and structure, unspecified site: Secondary | ICD-10-CM | POA: Diagnosis not present

## 2020-04-10 DIAGNOSIS — Z170 Estrogen receptor positive status [ER+]: Secondary | ICD-10-CM | POA: Diagnosis not present

## 2020-04-10 DIAGNOSIS — C50912 Malignant neoplasm of unspecified site of left female breast: Secondary | ICD-10-CM | POA: Diagnosis not present

## 2020-04-10 MED ORDER — ONDANSETRON HCL 4 MG/2ML IJ SOLN
INTRAMUSCULAR | Status: AC
  Filled 2020-04-10: qty 4

## 2020-04-10 MED ORDER — LIDOCAINE HCL 1 % IJ SOLN
INTRAMUSCULAR | Status: AC
  Filled 2020-04-10: qty 20

## 2020-04-10 MED ORDER — SODIUM CHLORIDE 0.9 % IV SOLN
INTRAVENOUS | Status: DC
  Filled 2020-04-10 (×2): qty 250

## 2020-04-10 MED ORDER — SODIUM CHLORIDE 0.9% FLUSH
10.0000 mL | Freq: Once | INTRAVENOUS | Status: AC
  Administered 2020-04-10: 10 mL via INTRAVENOUS
  Filled 2020-04-10: qty 10

## 2020-04-10 MED ORDER — ONDANSETRON HCL 4 MG/2ML IJ SOLN: 8.0000 mg | Freq: Once | INTRAMUSCULAR | Status: DC

## 2020-04-10 MED ORDER — HEPARIN SOD (PORK) LOCK FLUSH 100 UNIT/ML IV SOLN
INTRAVENOUS | Status: AC
  Filled 2020-04-10: qty 5

## 2020-04-10 MED ORDER — ONDANSETRON HCL 4 MG/2ML IJ SOLN
8.0000 mg | Freq: Once | INTRAMUSCULAR | Status: AC
  Administered 2020-04-10: 8 mg via INTRAVENOUS

## 2020-04-10 MED ORDER — HEPARIN SOD (PORK) LOCK FLUSH 100 UNIT/ML IV SOLN
500.0000 [IU] | Freq: Once | INTRAVENOUS | Status: AC
  Administered 2020-04-10: 500 [IU] via INTRAVENOUS
  Filled 2020-04-10: qty 5

## 2020-04-10 MED FILL — Dexamethasone Sodium Phosphate Inj 100 MG/10ML: INTRAMUSCULAR | Qty: 1 | Status: AC

## 2020-04-10 NOTE — Procedures (Signed)
Right basilic vein dual lumen PICC placed without immediate complications.  Length 47 cm.  Tip SVC/right atrial junction.  Okay to use.  Medication used- 1% lidocaine to skin and subcutaneous tissue. EBL< 5 cc.

## 2020-04-10 NOTE — Patient Instructions (Signed)
Rehydration, Adult Rehydration is the replacement of body fluids, salts, and minerals (electrolytes) that are lost during dehydration. Dehydration is when there is not enough water or other fluids in the body. This happens when you lose more fluids than you take in. Common causes of dehydration include:  Not drinking enough fluids. This can occur when you are ill or doing activities that require a lot of energy, especially in hot weather.  Conditions that cause loss of water or other fluids, such as diarrhea, vomiting, sweating, or urinating a lot.  Other illnesses, such as fever or infection.  Certain medicines, such as those that remove excess fluid from the body (diuretics). Symptoms of mild or moderate dehydration may include thirst, dry lips and mouth, and dizziness. Symptoms of severe dehydration may include increased heart rate, confusion, fainting, and not urinating. For severe dehydration, you may need to get fluids through an IV at the hospital. For mild or moderate dehydration, you can usually rehydrate at home by drinking certain fluids as told by your health care provider. What are the risks? Generally, rehydration is safe. However, taking in too much fluid (overhydration) can be a problem. This is rare. Overhydration can cause an electrolyte imbalance, kidney failure, or a decrease in salt (sodium) levels in the body. Supplies needed You will need an oral rehydration solution (ORS) if your health care provider tells you to use one. This is a drink to treat dehydration. It can be found in pharmacies and retail stores. How to rehydrate Fluids Follow instructions from your health care provider for rehydration. The kind of fluid and the amount you should drink depend on your condition. In general, you should choose drinks that you prefer.  If told by your health care provider, drink an ORS. ? Make an ORS by following instructions on the package. ? Start by drinking small amounts,  about  cup (120 mL) every 5-10 minutes. ? Slowly increase how much you drink until you have taken the amount recommended by your health care provider.  Drink enough clear fluids to keep your urine pale yellow. If you were told to drink an ORS, finish it first, then start slowly drinking other clear fluids. Drink fluids such as: ? Water. This includes sparkling water and flavored water. Drinking only water can lead to having too little sodium in your body (hyponatremia). Follow the advice of your health care provider. ? Water from ice chips you suck on. ? Fruit juice with water you add to it (diluted). ? Sports drinks. ? Hot or cold herbal teas. ? Broth-based soups. ? Milk or milk products. Food Follow instructions from your health care provider about what to eat while you rehydrate. Your health care provider may recommend that you slowly begin eating regular foods in small amounts.  Eat foods that contain a healthy balance of electrolytes, such as bananas, oranges, potatoes, tomatoes, and spinach.  Avoid foods that are greasy or contain a lot of sugar. In some cases, you may get nutrition through a feeding tube that is passed through your nose and into your stomach (nasogastric tube, or NG tube). This may be done if you have uncontrolled vomiting or diarrhea.   Beverages to avoid Certain beverages may make dehydration worse. While you rehydrate, avoid drinking alcohol.   How to tell if you are recovering from dehydration You may be recovering from dehydration if:  You are urinating more often than before you started rehydrating.  Your urine is pale yellow.  Your energy level   improves.  You vomit less frequently.  You have diarrhea less frequently.  Your appetite improves or returns to normal.  You feel less dizzy or less light-headed.  Your skin tone and color start to look more normal. Follow these instructions at home:  Take over-the-counter and prescription medicines only  as told by your health care provider.  Do not take sodium tablets. Doing this can lead to having too much sodium in your body (hypernatremia). Contact a health care provider if:  You continue to have symptoms of mild or moderate dehydration, such as: ? Thirst. ? Dry lips. ? Slightly dry mouth. ? Dizziness. ? Dark urine or less urine than normal. ? Muscle cramps.  You continue to vomit or have diarrhea. Get help right away if you:  Have symptoms of dehydration that get worse.  Have a fever.  Have a severe headache.  Have been vomiting and the following happens: ? Your vomiting gets worse or does not go away. ? Your vomit includes blood or green matter (bile). ? You cannot eat or drink without vomiting.  Have problems with urination or bowel movements, such as: ? Diarrhea that gets worse or does not go away. ? Blood in your stool (feces). This may cause stool to look black and tarry. ? Not urinating, or urinating only a small amount of very dark urine, within 6-8 hours.  Have trouble breathing.  Have symptoms that get worse with treatment. These symptoms may represent a serious problem that is an emergency. Do not wait to see if the symptoms will go away. Get medical help right away. Call your local emergency services (911 in the [place] Do not drive yourself to the hospital. Summary  Rehydration is the replacement of body fluids and minerals (electrolytes) that are lost during dehydration.  Follow instructions from your health care provider for rehydration. The kind of fluid and amount you should drink depend on your condition.  Slowly increase how much you drink until you have taken the amount recommended by your health care provider.  Contact your health care provider if you continue to show signs of mild or moderate dehydration. This information is not intended to replace advice given to you by your health care provider. Make sure you discuss any questions you have with  your health care provider. Document Revised: 04/17/2019 Document Reviewed: 02/25/2019 Elsevier Patient Education  2021 [employer]  

## 2020-04-12 NOTE — Progress Notes (Signed)
[place]  [phone]) [phone] Fax:(336) [phone]    ID: Brooke [name] DOB: 06-17-1950  MR#: [id number]  BTD#:[id number]  Patient Care Team: [name], MD as PCP - General (Family Medicine) Rolm Bookbinder, MD as Attending Physician (Dermatology) Nimsi Males, Virgie Dad, MD as Consulting Physician (Oncology) Kyung Rudd, MD as Consulting Physician (Radiation Oncology) Jovita Kussmaul, MD as Consulting Physician (General Surgery) Irene Limbo, MD as Consulting Physician (Plastic Surgery) Delice Bison, Charlestine Massed, NP as Nurse Practitioner (Hematology and Oncology) [name], MD as Consulting Physician (Obstetrics and Gynecology) Laurey Morale, MD as Consulting Physician (Family Medicine) OTHER MD:   CHIEF COMPLAINT: Estrogen receptor positive breast cancer (s/p left mastectomy) now widely metastatic  CURRENT TREATMENT: Weekly paclitaxel as tolerated  INTERVAL HISTORY: Brooke [name] returns today for follow-up and treatment of her now widely disseminated breast cancer, which is weakly estrogen receptor positive.    She started weekly paclitaxel on 03/31/2019. Today is week 3.  She is tolerating this moderately well.  This is not to say that she is doing well as she is not but I think the problems she is experiencing as far as I can tell are not directly related to her treatment.  She also started zoledronate on 03/19/2020, to be repeated every 12 weeks.  This caused her severe bone pain.  We will address that when we repeat that in April.  Since her last visit, she underwent right-sided PICC line placement on 04/10/2020.  She tolerated that well and she is scheduled for flush here 3 times a week and changing the dressing here every Friday.   REVIEW OF SYSTEMS: Brooke [name] is feeling desperate at this point.  She is having a lot of very loose watery bowel movements that she cannot control.  She cannot get anything down she says.  If she puts a cracker in her mouth  by the time she gets it to her mouth she feels like vomiting.  We prescribed lorazepam for her but she was not able to open the bottle she says.  She describes the diarrhea as multiple times a day, grainy and liquidy.  She has had no fever.  She does remind me that she took Cipro at some point in December for urinary tract infection.  She is planning to go get her portable oxygen tank on Wednesday.  She drove herself here and she is driving herself everywhere she needs to go.   COVID 19 VACCINATION STATUS: fully vaccinated [employer]), with booster 12/28/2019    HISTORY OF CURRENT ILLNESS: From the original intake note:  Brooke [name] herself palpated a change in her left breast and brought this to medical attention.  On 01/18/2017 she underwent bilateral diagnostic mammography with tomography and bilateral breast ultrasonography at [facility].  The breast density was category B.  At the most recent exam was from December 2016.  In the right breast upper outer quadrant there was a 1 cm irregular mass associated with punctate calcification.  By ultrasound this was not well-defined, and it was biopsied on 01/26/2017 with tomographic guidance this showed a complex sclerosing lesion with the usual ductal hyperplasia.  The right axilla was sonographically  On the left mammography showed a 4 cm dense mass with indistinct margins in the upper inner quadrant.  Ultrasound defined a 3 cm irregular mass in the upper inner quadrant of the left breast which was palpable.  There was an abnormal lymph node in the left axilla.  Biopsy of the left mass and abnormal  lymph node (SAA [id number], on 01/26/2017) showed both to be involved by invasive ductal carcinoma, with some lobular features but E-cadherin positive, estrogen and progesterone receptors both 100% positive, with strong staining intensity, with MIB-1 between 15-20%.  There was no HER-2 amplification, the signals ratio being 1.31/1.58 and the number per cell 2.30/2.05  The  patient's subsequent history is as detailed below.   PAST MEDICAL HISTORY: Past Medical History:  Diagnosis Date  . Anemia   . Anxiety   . Arthritis   . ASCUS of cervix with negative high risk HPV 12/2018  . Bilateral dry eyes   . Breast cancer, left breast ([place])   . Family history of breast cancer 02/02/2017  . Family history of breast cancer   . Family history of colon cancer 02/02/2017  . Family history of colon cancer   . Family history of prostate cancer   . Family history of prostate cancer in father 02/02/2017  . Family history of uterine cancer   . Fibroid   . History of kidney stones   . Hyperlipidemia   . Hypertension   . Melanoma of upper arm ([place]) 2009   RIGHT ARM   . Obesity (BMI 30.0-34.9)   . Osteopenia 09/2017   T score -1.9 max 8% / 0.8% statistically significant decline at right and left hip stable at the spine  . PONV (postoperative nausea and vomiting)   . Squamous carcinoma     of the skin  . UTI (urinary tract infection)   . Varicose veins of both lower extremities   . Vitamin D deficiency 07/2009   LOW VITAMIN D 29  . Vitamin deficiency 07/2008   VITAMIN D LOW 23    PAST SURGICAL HISTORY: Past Surgical History:  Procedure Laterality Date  . ABDOMINAL SURGERY  1975   ovairan cyst   . APPENDECTOMY    . BREAST LUMPECTOMY WITH RADIOACTIVE SEED LOCALIZATION Right 09/25/2017   Procedure: RIGHT BREAST LUMPECTOMY WITH RADIOACTIVE SEED LOCALIZATION;  Surgeon: Jovita Kussmaul, MD;  Location: [place];  Service: General;  Laterality: Right;  . BREAST SURGERY     bio  . CHOLECYSTECTOMY    . DERMOID CYST  EXCISION    . DILATATION & CURETTAGE/HYSTEROSCOPY WITH MYOSURE N/A 04/23/2019   Procedure: DILATATION & CURETTAGE/HYSTEROSCOPY WITH MYOSURE;  Surgeon: [name], MD;  Location: [place];  Service: Gynecology;  Laterality: N/A;  request to follow at 9:30am in [facility] block requests 45 minutes  . DILATION AND CURETTAGE OF UTERUS   1998  . GALLBLADDER SURGERY    . HYSTEROSCOPY    . insertion  port a cath  03/02/2016  . MASTECTOMY MODIFIED RADICAL Left 09/25/2017   Procedure: LEFT MODIFIED RADICAL MASTECTOMY;  Surgeon: Jovita Kussmaul, MD;  Location: [place];  Service: General;  Laterality: Left;  . PORTACATH PLACEMENT N/A 03/02/2017   Procedure: INSERTION PORT-A-CATH;  Surgeon: Jovita Kussmaul, MD;  Location: [place];  Service: General;  Laterality: N/A;  . SKIN BIOPSY    . SKIN CANCER EXCISION  2010,2011   2010-RIGHT ARM, 2011-LEFT LOWER LEG.-DR Rolm Bookbinder DERM. DR. Sarajane Jews IS HER DERM SURGEON.    FAMILY HISTORY Family History  Problem Relation Age of Onset  . Diabetes Father   . Hypertension Father   . Heart disease Father   . Prostate cancer Father 52  . Arthritis Father   . Breast cancer Sister 82       again at age 93, GT results unk  .  Skin cancer Sister   . Colon cancer Paternal Grandfather 67  . Hypertension Mother   . Heart failure Mother   . Uterine cancer Sister 4       stage IV  . Breast cancer Cousin        mat first cousin dx under 73  The patient's father was diagnosed with prostate cancer in his 72s and died at age 35 from complications of that disease.  The patient's mother died at age 34 with congestive heart failure.  The patient had no brothers, 3 sisters.  One sister had breast cancer at age 57 and again at age 3.  She has been genetically tested and was found to be BRCA not mutated in addition a paternal grandfather had colon cancer in his 30s.  There is no history of ovarian cancer in the family to the patient's knowledge.   GYNECOLOGIC HISTORY:  Patient's last menstrual period was 03/18/1997. Menarche age 4. The patient is GX P0.  She stopped having periods in 1999 and took hormone replacement approximately 5 years.   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  She is now  retired.  She is single and lives alone with no pets.    ADVANCED DIRECTIVES: Not in place   HEALTH MAINTENANCE: Social History   Tobacco Use  . Smoking status: Never Smoker  . Smokeless tobacco: Never Used  Vaping Use  . Vaping Use: Never used  Substance Use Topics  . Alcohol use: No  . Drug use: No    Colonoscopy: Never  PAP: 12/2019, negative  Bone density: 2017/osteopenia   Allergies  Allergen Reactions  . Neosporin [Neomycin-Bacitracin Zn-Polymyx] Swelling, Rash and Other (See Comments)    Blisters  . Bactrim [Sulfamethoxazole-Trimethoprim]     Made her feel very sick  . Benzalkonium Chloride Other (See Comments)    Redness (Pt is unaware)  It works by killing microorganisms and inhibiting their future growth, and for this reason frequently appears as an ingredient in antibacterial hand wipes, antiseptic creams and anti-itch ointments.  . Latex Rash    Current Outpatient Medications  Medication Sig Dispense Refill  . dexamethasone (DECADRON) 4 MG tablet Take 1 tablet (4 mg total) by mouth daily. 60 tablet 1  . LORazepam (ATIVAN) 0.5 MG tablet Place 1 tablet (0.5 mg total) under the tongue every 8 (eight) hours as needed for anxiety. PT to use for nausea as needed 30 tablet 0  . losartan (COZAAR) 25 MG tablet TAKE 1 TABLET(25 MG) BY MOUTH DAILY (Patient taking differently: Taking 1/2 tablet) 90 tablet 1  . ondansetron (ZOFRAN-ODT) 4 MG disintegrating tablet Take 1 tablet (4 mg total) by mouth every 8 (eight) hours as needed for nausea or vomiting. 20 tablet 0  . prochlorperazine (COMPAZINE) 10 MG tablet Take 1 tablet (10 mg total) by mouth every 6 (six) hours as needed (Nausea or vomiting). 30 tablet 1  . Propylene Glycol 0.6 % SOLN Place 1 drop into both eyes daily.    . vitamin C (ASCORBIC ACID) 500 MG tablet Take 1,000 mg by mouth daily.     . vitamin E 600 UNIT capsule Take 400 Units by mouth 2 (two) times a week.      No current facility-administered medications  for this visit.   Facility-Administered Medications Ordered in Other Visits  Medication Dose Route Frequency Provider Last Rate Last Admin  . 0.9 %  sodium chloride infusion   Intravenous Once Vivan Agostino, Virgie Dad, MD 500 mL/hr at 04/13/20 1426 New Bag at 04/13/20 1426     OBJECTIVE:  white woman examined in a wheelchair Vitals:   04/13/20 1341  BP: (!) 151/64  Pulse: 88  Resp: 15  Temp: 97.9 F (36.6 C)  SpO2: 95%     Body mass index is 29.48 kg/m.   Wt Readings from Last 3 Encounters:  04/13/20 188 lb 3.2 oz (85.4 kg)  04/08/20 188 lb 9.6 oz (85.5 kg)  04/06/20 188 lb 6.4 oz (85.5 kg)  ECOG FS:1 - Symptomatic but completely ambulatory   Sclerae unicteric, EOMs intact Wearing a mask No cervical or supraclavicular adenopathy Lungs no rales or rhonchi Heart regular rate and rhythm Abd soft, nontender, positive bowel sounds MSK PICC line intact in right upper extremity Neuro: nonfocal, well oriented, depressed, discouraged and labile affect Breasts: Deferred  LAB RESULTS:  CMP     Component Value Date/Time   NA 137 04/13/2020 1308   NA 141 03/03/2017 1358   K 3.4 (L) 04/13/2020 1308   K 3.3 (L) 03/03/2017 1358   CL 105 04/13/2020 1308   CO2 19 (L) 04/13/2020 1308   CO2 28 03/03/2017 1358   GLUCOSE 104 (H) 04/13/2020 1308   GLUCOSE 107 03/03/2017 1358   BUN 9 04/13/2020 1308   BUN 12.7 03/03/2017 1358   CREATININE 0.69 04/13/2020 1308   CREATININE 0.83 02/19/2020 0936   CREATININE 0.9 03/03/2017 1358   CALCIUM 7.9 (L) 04/13/2020 1308   CALCIUM 9.4 03/03/2017 1358   PROT 5.4 (L) 04/13/2020 1308   PROT 6.5 03/03/2017 1358   ALBUMIN 3.0 (L) 04/13/2020 1308   ALBUMIN 3.7 03/03/2017 1358   AST 105 (H) 04/13/2020 1308   AST 43 (H) 09/12/2018 1354   AST 23 03/03/2017 1358   ALT 40 04/13/2020 1308   ALT 84 (H) 09/12/2018 1354   ALT 25 03/03/2017 1358   ALKPHOS 220 (H) 04/13/2020 1308   ALKPHOS 71 03/03/2017 1358   BILITOT 1.6 (H) 04/13/2020 1308   BILITOT 0.6  09/12/2018 1354   BILITOT 0.52 03/03/2017 1358   GFRNONAA >60 04/13/2020 1308   GFRNONAA >60 09/12/2018 1354   GFRAA >60 06/12/2019 1401   GFRAA >60 09/12/2018 1354    No results found for: TOTALPROTELP, ALBUMINELP, A1GS, A2GS, BETS, BETA2SER, GAMS, MSPIKE, SPEI  No results found for: KPAFRELGTCHN, LAMBDASER, KAPLAMBRATIO  Lab Results  Component Value Date   WBC 2.6 (L) 04/13/2020   NEUTROABS 2.0 04/13/2020   HGB 10.7 (L) 04/13/2020   HCT 31.7 (L) 04/13/2020   MCV 85.0 04/13/2020   PLT 126 (L) 04/13/2020      Chemistry      Component Value Date/Time   NA 137 04/13/2020 1308   NA 141 03/03/2017 1358   K 3.4 (L) 04/13/2020 1308   K 3.3 (L) 03/03/2017 1358   CL 105 04/13/2020 1308   CO2 19 (L) 04/13/2020 1308   CO2 28 03/03/2017 1358   BUN 9 04/13/2020 1308   BUN 12.7 03/03/2017 1358   CREATININE 0.69 04/13/2020 1308   CREATININE 0.83 02/19/2020 0936   CREATININE 0.9 03/03/2017 1358      Component Value Date/Time   CALCIUM 7.9 (L) 04/13/2020 1308   CALCIUM 9.4 03/03/2017 1358   ALKPHOS 220 (H) 04/13/2020 1308   ALKPHOS 71 03/03/2017 1358   AST 105 (H) 04/13/2020 1308   AST 43 (H) 09/12/2018 1354   AST 23 03/03/2017 1358  ALT 40 04/13/2020 1308   ALT 84 (H) 09/12/2018 1354   ALT 25 03/03/2017 1358   BILITOT 1.6 (H) 04/13/2020 1308   BILITOT 0.6 09/12/2018 1354   BILITOT 0.52 03/03/2017 1358       No results found for: LABCA2  No components found for: PJASNK539  No results for input(s): INR in the last 168 hours.  No results found for: LABCA2  Lab Results  Component Value Date   JQB341 32 03/06/2020    No results found for: PFX902  No results found for: IOX735  Lab Results  Component Value Date   CA2729 983.0 (H) 03/06/2020    No components found for: HGQUANT  Lab Results  Component Value Date   CEA1 744.17 (H) 03/06/2020   /  CEA ([facility])  Date Value Ref Range Status  03/06/2020 744.17 (H) 0.00 - 5.00 ng/mL Final    Comment:     (NOTE) This test was performed using [employer]'s Chemiluminescent Microparticle Immunoassay. Values obtained from different assay methods cannot be used interchangeably. Please note that 5-10% of patients who smoke may see CEA levels up to 6.9 ng/mL. Performed at [facility], [place] [address], [place], [place] [address]      No results found for: AFPTUMOR  No results found for: CHROMOGRNA  No results found for: HGBA, HGBA2QUANT, HGBFQUANT, HGBSQUAN (Hemoglobinopathy evaluation)   No results found for: LDH  Lab Results  Component Value Date   IRON 52 02/19/2020   TIBC 327 02/19/2020   IRONPCTSAT 16 02/19/2020   (Iron and TIBC)  Lab Results  Component Value Date   FERRITIN 298 (H) 02/19/2020    Urinalysis    Component Value Date/Time   COLORURINE DARK YELLOW 02/19/2020 0947   APPEARANCEUR CLOUDY (A) 02/19/2020 0947   LABSPEC 1.018 02/19/2020 0947   PHURINE 6.0 02/19/2020 0947   GLUCOSEU NEGATIVE 02/19/2020 0947   GLUCOSEU NEGATIVE 11/05/2012 1559   HGBUR 1+ (A) 02/19/2020 0947   BILIRUBINUR neg 08/10/2018 1529   KETONESUR 2+ (A) 02/19/2020 0947   PROTEINUR 2+ (A) 02/19/2020 0947   UROBILINOGEN 0.2 08/10/2018 1529   UROBILINOGEN 0.2 11/05/2012 1559   NITRITE neg 08/10/2018 1529   NITRITE NEGATIVE 05/29/2015 1113   LEUKOCYTESUR Negative 08/10/2018 1529    STUDIES: MR Brain W Wo Contrast  Result Date: 03/26/2020 CLINICAL DATA:  Malignant neoplasm of overlapping sites of left breast in female, estrogen receptor positive. Breast cancer, staging; evaluate for brain metastases or meningeal spread. EXAM: MRI HEAD WITHOUT AND WITH CONTRAST TECHNIQUE: Multiplanar, multiecho pulse sequences of the brain and surrounding structures were obtained without and with intravenous contrast. CONTRAST:  17mL GADAVIST GADOBUTROL 1 MMOL/ML IV SOLN COMPARISON:  Head CT 03/05/2020. FINDINGS: Brain: Mild intermittent motion degradation. Cerebral volume is normal  for age. There is pachymeningeal dural thickening and enhancement overlying the right greater than left cerebral convexities. This measures up to 8 mm in thickness overlying the right cerebral convexity (series 17, image 23). Findings are compatible with dural-based metastatic disease. Additionally, there is nodular dural based metastatic disease along the mid falx measuring 4.5 x 1.0 cm (AP X TV). 1 cm enhancing lesion along the left cerebellum which appears dural based (series 16, image 37). Mild surrounding parenchymal edema. No other parenchymal lesions are identified. No significant cerebral white matter disease. Small chronic infarct within the right cerebellum. There is no acute infarct. No chronic intracranial blood products. No extra-axial fluid collection. No midline shift. Vascular: Expected proximal arterial flow  voids. Skull and upper cervical spine: Inhomogeneous T1 hypointense signal throughout the calvarium. Sinuses/Orbits: Visualized orbits show no acute finding. Trace ethmoid and right maxillary sinus mucosal thickening. Other: Numerous nonspecific small lesions within the bilateral scalp and visualized neck. These results will be called to the ordering clinician or representative by the Radiologist Assistant, and communication documented in the PACS or [employer]. IMPRESSION: Pachymeningeal dural thickening and enhancement overlying the right greater than left cerebral convexities measuring up to 8 mm in thickness. Findings are compatible with dural-based metastatic disease. Additional nodular dural-based metastatic disease along the mid falx measuring 4.5 x 1.0 cm (AP x TV). 1 cm enhancing lesion along the left cerebellum which appears dural-based. This may reflect additional dural-based metastatic disease or a small meningioma. Mild parenchymal edema within the underlying left cerebellum. Inhomogeneous T1 hypointense signal within the bilateral calvarium, highly suspicious for calvarial  metastases. Numerous small nonspecific lesions within the bilateral scalp and visualized neck. Direct visualization recommended. Small chronic lacunar infarct within the right cerebellar hemisphere. Electronically Signed   By: Jackey Loge DO   On: 03/26/2020 14:52   IR PICC PLACEMENT RIGHT >5 YRS INC IMG GUIDE  Result Date: 04/10/2020 INDICATION: Patient with history of metastatic breast cancer and poor venous access; central venous access requested for chemotherapy. EXAM: ULTRASOUND AND FLUOROSCOPIC GUIDED PICC LINE INSERTION MEDICATIONS: 1% lidocaine to skin and subcutaneous tissue ANESTHESIA/SEDATION: None FLUOROSCOPY TIME:  Fluoroscopy Time: 1 minutes 6 seconds (6 mGy). COMPLICATIONS: None immediate. TECHNIQUE: The procedure, risks, benefits, and alternatives were explained to the patient and informed written consent was obtained. A timeout was performed prior to the initiation of the procedure. The right upper extremity was prepped with chlorhexidine in a sterile fashion, and a sterile drape was applied covering the operative field. Maximum barrier sterile technique with sterile gowns and gloves were used for the procedure. A timeout was performed prior to the initiation of the procedure. Local anesthesia was provided with 1% lidocaine. Under direct ultrasound guidance, the right basilic vein was accessed with a micropuncture kit after the overlying soft tissues were anesthetized with 1% lidocaine. An ultrasound image was saved for documentation purposes. A guidewire was advanced to the level of the superior caval-atrial junction for measurement purposes and the PICC line was cut to length. A peel-away sheath was placed and a 47 cm, 5 [place], dual lumen was inserted to level of the superior caval-atrial junction. A post procedure spot fluoroscopic was obtained. The catheter easily aspirated and flushed and was sutured in place. A dressing was placed. The patient tolerated the procedure well without  immediate post procedural complication. FINDINGS: After catheter placement, the tip lies within the superior cavoatrial junction the catheter aspirates and flushes normally and is ready for immediate use. IMPRESSION: Successful ultrasound and fluoroscopic guided placement of a right basilic vein approach, 47 cm, 5 French, dual lumen PICC with tip at the superior caval-atrial junction. The PICC line is ready for immediate use. Read by: Jeananne Rama, PA-C Electronically Signed   By: Malachy Moan M.D.   On: 04/10/2020 09:42     ELIGIBLE FOR AVAILABLE RESEARCH PROTOCOL: no   ASSESSMENT: 70 y.o. [place] woman status post bilateral biopsies 01/26/2017, showing  (1) in the right breast, a complex sclerosing lesion, status post lumpectomy 09/25/2017, with no malignancy identified.  (2) in the left breast, a cT2 pN1 invasive ductal carcinoma (with some lobular features but E-cadherin positive), grade 2, estrogen and progesterone receptor positive, HER-2 not amplified, with an MIB-1  of 15-20%  (a) breast MRI 02/04/2017 suggests a T3 N1 tumor  (3) started neoadjuvant cyclophosphamide/docetaxel 03/03/2017, discontinued after 1 cycle with very poor tolerance  (a) started cyclophosphamide/methotrexate/fluorouracil (CMF) 03/28/2017, repeated x7 cycles, last dose 08/01/2017  (4) status post left modified radical mastectomy 09/25/17 for a ypT5 ypN2, stage IIIA invasive ductal carcinoma, grade 2, again estrogen and progesterone receptor positive, HER-2 not amplified  (5) adjuvant radiation completed 12/21/2017  (a) capecitabine sensitization tolerated only the initial week of radiation (b) Site/dose: The patient initially received a dose of 50.4 Gy in 28 fractions to the left chest wall and left supraclavicular region. This was delivered using a 3-D conformal, 4 field technique. The patient then received a boost to the mastectomy scar. This delivered an additional 10 Gy in 5 fractions using an en face  electron field. The total dose was 60.4 Gy.  ( 6) tamoxifen started 03/09/2018, discontinued January 2022 with metastatic progression  (7) genetics testing 12/12/2017 through the Multi-Gene Panel offered by [facility] found no deleterious mutations in AIP, ALK, APC, ATM, AXIN2,BAP1,  BARD1, BLM, BMPR1A, BRCA1, BRCA2, BRIP1, CASR, CDC73, CDH1, CDK4, CDKN1B, CDKN1C, CDKN2A (p14ARF), CDKN2A (p16INK4a), CEBPA, CHEK2, CTNNA1, DICER1, DIS3L2, EGFR (c.2369C>T, p.Thr790Met variant only), EPCAM (Deletion/duplication testing only), FH, FLCN, GATA2, GPC3, GREM1 (Promoter region deletion/duplication testing only), HOXB13 (c.251G>A, p.Gly84Glu), HRAS, KIT, MAX, MEN1, MET, MITF (c.952G>A, p.Glu318Lys variant only), MLH1, MSH2, MSH3, MSH6, MUTYH, NBN, NF1, NF2, NTHL1, PALB2, PDGFRA, PHOX2B, PMS2, POLD1, POLE, POT1, PRKAR1A, PTCH1, PTEN, RAD50, RAD51C, RAD51D, RB1, RECQL4, RET, RUNX1, SDHAF2, SDHA (sequence changes only), SDHB, SDHC, SDHD, SMAD4, SMARCA4, SMARCB1, SMARCE1, STK11, SUFU, TERC, TERT, TMEM127, TP53, TSC1, TSC2, VHL, WRN and WT1.   (8) Foundation 1 testing shows stable microsatellite status and 1 mutation/ Mb, TP53 mutations x2  (a) PD-L1 dated July 2019 negative  METASTATIC DISEASE: January 2022 (9) CT angio of the chest 02/27/2020 shows multiple hilar nodes consistent with prior granulomatous infection, multiple small pulmonary nodules, multiple punctate granulomas throughout the liver and spleen, nodular hepatic contour and areas of mottling possibly due to steatosis, 1.6 cm left adrenal nodule  (a) PET scan 03/05/2020 shows mildly to highly hypermetabolic adenopathy, the pulmonary nodules being mildly to not hypermetabolic, a large right hepatic liver lesion with an SUV of 13.2 and additional hypermetabolic right and left lobe foci, the adrenal lesion being hypermetabolic with an SUV of 16.  There are also peritoneal nodules and innumerable foci of bony involvement.  Incidental note was made of small  heterogeneous foci of increased uptake in the cerebellum.  The CT images did not show gross abnormality in the visualized brain  (b) on 03/09/2020 CA 27-29 was 983.0, CEA 744.17, the CA 19-9 normal at 32  (c) liver biopsy 03/13/2020 confirms metastati carcinoma, estrogen receptor positive, progesterone and HER 2 negative, with an Mib-1 of 40%  (d) brain MRI 03/26/2020 shows dural involvement including a 1.0 cm dural-based cerebellar lesion  (10) paclitaxel started 03/30/2020, to be repeated weekly as tolerated  (11) zoledronate started 03/19/2020, to be repeated every 12 weeks  (12) foundation 1 on the current cancer pending   PLAN: Brooke [name] did moderately well with her first 2 doses of paclitaxel.  At least I cannot tie her multiple and severe complaints to this drug.  Her white cell count has dropped and I would rather give her a break at this week and let her count rise then let her counts drop to dangerous levels and then have to wait next week anyway.  She will receive fluids today however.  I offered to get her set up with home health to flush her  PICC line at home, but she tells me she prefers to come here 3 times a week and that is what she will be doing.  We went over lorazepam and she was able to open the bottle today.  I told her to take a lorazepam when she gets home then 20 minutes later take a Zofran and then 20 minutes after that try to eat.  I specifically recommended white bread, potatoes, white rice, and pasta dishes which are the simplest to digest.  She will let me know when she returns if that works for her  I am very concerned that this patient may not have told her family all that is going on.  She tells me her sister in [facility] is very reliable but is older, would not be able to drive herself here, and Sherre says she does not know how she could help her.  The sister lives in [place] and basically does not drive.  She would need to be driven here or to  be driven to the store and so 1.  She does not sound like a helpful person but at least she might be some company for [facility].  We elected that she is going to discuss this with her sisters and see if one of them can possibly come and help her in the next couple of weeks  Otherwise she will return next week.  I anticipate that we will do her third cycle of Taxol at that point.  I would want to do at least 3 cycles that 6 doses before deciding whether this is working for her or not.  Today I asked her to give [place] a sample to check for C. difficile diarrhea.  Total encounter time 35 minutes.*   Evan Osburn, Virgie Dad, MD  04/13/20 2:50 PM Medical Oncology and Hematology [facility] [place], [place] [address] Tel. [phone]    Fax. [phone]   I, Wilburn Mylar, am acting as scribe for Dr. Virgie Dad. Lyrik Buresh.  I, Lurline Del MD, have reviewed the above documentation for accuracy and completeness, and I agree with the above.   *Total Encounter Time as defined by the Centers for Medicare and Medicaid Services includes, in addition to the face-to-face time of a patient visit (documented in the note above) non-face-to-face time: obtaining and reviewing outside history, ordering and reviewing medications, tests or procedures, care coordination (communications with other health care professionals or caregivers) and documentation in the medical record.

## 2020-04-13 ENCOUNTER — Inpatient Hospital Stay: Payer: Medicare PPO

## 2020-04-13 ENCOUNTER — Inpatient Hospital Stay: Payer: Medicare PPO | Admitting: Oncology

## 2020-04-13 ENCOUNTER — Ambulatory Visit: Payer: Medicare PPO | Admitting: Oncology

## 2020-04-13 ENCOUNTER — Encounter: Payer: Self-pay | Admitting: General Practice

## 2020-04-13 ENCOUNTER — Other Ambulatory Visit: Payer: Self-pay

## 2020-04-13 ENCOUNTER — Other Ambulatory Visit: Payer: Medicare PPO

## 2020-04-13 ENCOUNTER — Other Ambulatory Visit: Payer: Self-pay | Admitting: *Deleted

## 2020-04-13 VITALS — BP 137/71 | HR 92 | Temp 98.4°F | Resp 16

## 2020-04-13 VITALS — BP 151/64 | HR 88 | Temp 97.9°F | Resp 15 | Ht 67.0 in | Wt 188.2 lb

## 2020-04-13 DIAGNOSIS — C787 Secondary malignant neoplasm of liver and intrahepatic bile duct: Secondary | ICD-10-CM

## 2020-04-13 DIAGNOSIS — Z9012 Acquired absence of left breast and nipple: Secondary | ICD-10-CM | POA: Diagnosis not present

## 2020-04-13 DIAGNOSIS — E860 Dehydration: Secondary | ICD-10-CM | POA: Diagnosis not present

## 2020-04-13 DIAGNOSIS — Z95828 Presence of other vascular implants and grafts: Secondary | ICD-10-CM

## 2020-04-13 DIAGNOSIS — C50812 Malignant neoplasm of overlapping sites of left female breast: Secondary | ICD-10-CM

## 2020-04-13 DIAGNOSIS — C50912 Malignant neoplasm of unspecified site of left female breast: Secondary | ICD-10-CM | POA: Diagnosis not present

## 2020-04-13 DIAGNOSIS — D0361 Melanoma in situ of right upper limb, including shoulder: Secondary | ICD-10-CM

## 2020-04-13 DIAGNOSIS — Z170 Estrogen receptor positive status [ER+]: Secondary | ICD-10-CM

## 2020-04-13 DIAGNOSIS — C7951 Secondary malignant neoplasm of bone: Secondary | ICD-10-CM | POA: Diagnosis not present

## 2020-04-13 DIAGNOSIS — M8580 Other specified disorders of bone density and structure, unspecified site: Secondary | ICD-10-CM | POA: Diagnosis not present

## 2020-04-13 DIAGNOSIS — Z7189 Other specified counseling: Secondary | ICD-10-CM

## 2020-04-13 DIAGNOSIS — Z5111 Encounter for antineoplastic chemotherapy: Secondary | ICD-10-CM | POA: Diagnosis not present

## 2020-04-13 DIAGNOSIS — D649 Anemia, unspecified: Secondary | ICD-10-CM | POA: Diagnosis not present

## 2020-04-13 DIAGNOSIS — C7800 Secondary malignant neoplasm of unspecified lung: Secondary | ICD-10-CM

## 2020-04-13 DIAGNOSIS — R110 Nausea: Secondary | ICD-10-CM

## 2020-04-13 LAB — COMPREHENSIVE METABOLIC PANEL
ALT: 40 U/L (ref 0–44)
AST: 105 U/L — ABNORMAL HIGH (ref 15–41)
Albumin: 3 g/dL — ABNORMAL LOW (ref 3.5–5.0)
Alkaline Phosphatase: 220 U/L — ABNORMAL HIGH (ref 38–126)
Anion gap: 13 (ref 5–15)
BUN: 9 mg/dL (ref 8–23)
CO2: 19 mmol/L — ABNORMAL LOW (ref 22–32)
Calcium: 7.9 mg/dL — ABNORMAL LOW (ref 8.9–10.3)
Chloride: 105 mmol/L (ref 98–111)
Creatinine, Ser: 0.69 mg/dL (ref 0.44–1.00)
GFR, Estimated: >60 mL/min (ref >60)
Glucose, Bld: 104 mg/dL — ABNORMAL HIGH (ref 70–99)
Potassium: 3.4 mmol/L — ABNORMAL LOW (ref 3.5–5.1)
Sodium: 137 mmol/L (ref 135–145)
Total Bilirubin: 1.6 mg/dL — ABNORMAL HIGH (ref 0.3–1.2)
Total Protein: 5.4 g/dL — ABNORMAL LOW (ref 6.5–8.1)

## 2020-04-13 LAB — CBC WITH DIFFERENTIAL/PLATELET
Abs Immature Granulocytes: 0.1 10*3/uL — ABNORMAL HIGH (ref 0.00–0.07)
Band Neutrophils: 7 %
Basophils Absolute: 0.1 10*3/uL (ref 0.0–0.1)
Basophils Relative: 2 %
Eosinophils Absolute: 0 10*3/uL (ref 0.0–0.5)
Eosinophils Relative: 1 %
HCT: 31.7 % — ABNORMAL LOW (ref 36.0–46.0)
Hemoglobin: 10.7 g/dL — ABNORMAL LOW (ref 12.0–15.0)
Lymphocytes Relative: 12 %
Lymphs Abs: 0.3 10*3/uL — ABNORMAL LOW (ref 0.7–4.0)
MCH: 28.7 pg (ref 26.0–34.0)
MCHC: 33.8 g/dL (ref 30.0–36.0)
MCV: 85 fL (ref 80.0–100.0)
Metamyelocytes Relative: 2 %
Monocytes Absolute: 0.2 10*3/uL (ref 0.1–1.0)
Monocytes Relative: 7 %
Myelocytes: 1 %
Neutro Abs: 2 10*3/uL (ref 1.7–7.7)
Neutrophils Relative %: 68 %
Platelets: 126 10*3/uL — ABNORMAL LOW (ref 150–400)
RBC: 3.73 MIL/uL — ABNORMAL LOW (ref 3.87–5.11)
RDW: 20.4 % — ABNORMAL HIGH (ref 11.5–15.5)
WBC: 2.6 10*3/uL — ABNORMAL LOW (ref 4.0–10.5)
nRBC: 7.8 % — ABNORMAL HIGH (ref 0.0–0.2)

## 2020-04-13 MED ORDER — SODIUM CHLORIDE 0.9% FLUSH
10.0000 mL | INTRAVENOUS | Status: DC | PRN
  Administered 2020-04-13: 10 mL via INTRAVENOUS
  Filled 2020-04-13: qty 10

## 2020-04-13 MED ORDER — SODIUM CHLORIDE 0.9 % IV SOLN
INTRAVENOUS | Status: DC
  Filled 2020-04-13: qty 250

## 2020-04-13 MED ORDER — HEPARIN SOD (PORK) LOCK FLUSH 100 UNIT/ML IV SOLN
500.0000 [IU] | Freq: Once | INTRAVENOUS | Status: AC | PRN
Start: 2020-04-13 — End: 2020-04-13
  Administered 2020-04-13: 250 [IU]
  Filled 2020-04-13: qty 5

## 2020-04-13 MED ORDER — PROCHLORPERAZINE EDISYLATE 10 MG/2ML IJ SOLN
10.0000 mg | Freq: Once | INTRAMUSCULAR | Status: AC
  Administered 2020-04-13: 10 mg via INTRAVENOUS

## 2020-04-13 MED ORDER — SODIUM CHLORIDE 0.9% FLUSH
10.0000 mL | INTRAVENOUS | Status: DC | PRN
  Administered 2020-04-13 (×2): 10 mL
  Filled 2020-04-13: qty 10

## 2020-04-13 MED ORDER — HEPARIN SOD (PORK) LOCK FLUSH 100 UNIT/ML IV SOLN
250.0000 [IU] | Freq: Once | INTRAVENOUS | Status: AC | PRN
Start: 2020-04-13 — End: 2020-04-13
  Administered 2020-04-13: 250 [IU]
  Filled 2020-04-13: qty 5

## 2020-04-13 MED ORDER — SODIUM CHLORIDE 0.9% FLUSH
10.0000 mL | Freq: Once | INTRAVENOUS | Status: DC
  Filled 2020-04-13: qty 10

## 2020-04-13 MED ORDER — PROCHLORPERAZINE EDISYLATE 10 MG/2ML IJ SOLN
INTRAMUSCULAR | Status: AC
  Filled 2020-04-13: qty 2

## 2020-04-13 MED ORDER — PROCHLORPERAZINE EDISYLATE 10 MG/2ML IJ SOLN: 10.0000 mg | Freq: Once | INTRAMUSCULAR | Status: DC

## 2020-04-13 MED ORDER — SODIUM CHLORIDE 0.9 % IV SOLN
Freq: Once | INTRAVENOUS | Status: AC
  Filled 2020-04-13: qty 250

## 2020-04-13 NOTE — Patient Instructions (Addendum)
PICC Home Care Guide A peripherally inserted central catheter (PICC) is a form of IV access that allows medicines and IV fluids to be quickly distributed throughout the body. The PICC is a long, thin, flexible tube (catheter) that is inserted into a vein in the upper arm. The catheter ends in a large vein in the chest (superior vena cava, or SVC). After the PICC is inserted, a chest X-ray may be done to make sure that it is in the correct place. A PICC may be placed for different reasons, such as:  To give medicines and liquid nutrition.  To give IV fluids and blood products.  If there is trouble placing a peripheral intravenous (PIV) catheter. If taken care of properly, a PICC can remain in place for several months. Having a PICC can also allow a person to go home from the hospital sooner. Medicine and PICC care can be managed at home by a family member, caregiver, or home health care team. What are the risks? Generally, having a PICC is safe. However, problems may occur, including:  A blood clot (thrombus) forming in or at the tip of the PICC.  A blood clot forming in a vein (deep vein thrombosis) or traveling to the lung (pulmonary embolism).  Inflammation of the vein (phlebitis) in which the PICC is placed.  Infection. Central line associated blood stream infection (CLABSI) is a serious infection that often requires hospitalization.  PICC movement (malposition). The PICC tip may move from its original position due to excessive physical activity, forceful coughing, sneezing, or vomiting.  A break or cut in the PICC. It is important not to use scissors near the PICC.  Nerve or tendon irritation or injury during PICC insertion. How to take care of your PICC Preventing problems  You and any caregivers should wash your hands often with soap. Wash hands: ? Before touching the PICC line or the infusion device. ? Before changing a bandage (dressing).  Flush the PICC as told by your  health care provider. Let your health care provider know right away if the PICC is hard to flush or does not flush. Do not use force to flush the PICC.  Do not use a syringe that is less than 10 mL to flush the PICC.  Avoid blood pressure checks on the arm in which the PICC is placed.  Never pull or tug on the PICC.  Do not take the PICC out yourself. Only a trained clinical professional should remove the PICC.  Use clean and sterile supplies only. Keep the supplies in a dry place. Do not reuse needles, syringes, or any other supplies. Doing that can lead to infection.  Keep pets and children away from your PICC line.  Check the PICC insertion site every day for signs of infection. Check for: ? Leakage. ? Redness, swelling, or pain. ? Fluid or blood. ? Warmth. ? Pus or a bad smell. PICC dressing care  Keep your PICC bandage (dressing) clean and dry to prevent infection.  Do not take baths, swim, or use a hot tub until your health care provider approves. Ask your health care provider if you can take showers. You may only be allowed to take sponge baths for bathing. When you are allowed to shower: ? Ask your health care provider to teach you how to wrap the PICC line. ? Cover the PICC line with clear plastic wrap and tape to keep it dry while showering.  Follow instructions from your health care provider about   how to take care of your insertion site and dressing. Make sure you: ? Wash your hands with soap and water before you change your bandage (dressing). If soap and water are not available, use hand sanitizer. ? Change your dressing as told by your health care provider. ? Leave stitches (sutures), skin glue, or adhesive strips in place. These skin closures may need to stay in place for 2 weeks or longer. If adhesive strip edges start to loosen and curl up, you may trim the loose edges. Do not remove adhesive strips completely unless your health care provider tells you to do  that.  Change your PICC dressing if it becomes loose or wet. General instructions  Carry your PICC identification card or wear a medical alert bracelet at all times.  Keep the tube clamped at all times, unless it is being used.  Carry a smooth-edge clamp with you at all times to place on the tube if it breaks.  Do not use scissors or sharp objects near the tube.  You may bend your arm and move it freely. If your PICC is near or at the bend of your elbow, avoid activity with repeated motion at the elbow.  Avoid lifting heavy objects as told by your health care provider.  Keep all follow-up visits as told by your health care provider. This is important.   Disposal of supplies  Throw away any syringes in a disposal container that is meant for sharp items (sharps container). You can buy a sharps container from a pharmacy, or you can make one by using an empty hard plastic bottle with a cover.  Place any used dressings or infusion bags into a plastic bag. Throw that bag in the trash. Contact a health care provider if:  You have pain in your arm, ear, face, or teeth.  You have a fever or chills.  You have redness, swelling, or pain around the insertion site.  You have fluid or blood coming from the insertion site.  Your insertion site feels warm to the touch.  You have pus or a bad smell coming from the insertion site.  Your skin feels hard and raised around the insertion site. Get help right away if:  Your PICC is accidentally pulled all the way out. If this happens, cover the insertion site with a bandage or gauze dressing. Do not throw the PICC away. Your health care provider will need to check it.  Your PICC was tugged or pulled and has partially come out. Do not  push the PICC back in.  You cannot flush the PICC, it is hard to flush, or the PICC leaks around the insertion site when it is flushed.  You hear a "flushing" sound when the PICC is flushed.  You feel your  heart racing or skipping beats.  There is a hole or tear in the PICC.  You have swelling in the arm in which the PICC was inserted.  You have a red streak going up your arm from where the PICC was inserted. Summary  A peripherally inserted central catheter (PICC) is a long, thin, flexible tube (catheter) that is inserted into a vein in the upper arm.  The PICC is inserted using a sterile technique by a specially trained nurse or physician. Only a trained clinical professional should remove it.  Keep your PICC identification card with you at all times.  Avoid blood pressure checks on the arm in which the PICC is placed.  If cared for   properly, a PICC can remain in place for several months. Having a PICC can also allow a person to go home from the hospital sooner. This information is not intended to replace advice given to you by your health care provider. Make sure you discuss any questions you have with your health care provider. Document Revised: 06/26/2019 Document Reviewed: 06/26/2019 Elsevier Patient Education  2021 Elsevier Inc.  

## 2020-04-13 NOTE — Progress Notes (Signed)
[place] Spiritual Care Note  Attempted follow-up visit in infusion, but Brooke [name] was very sleepy, so we plan to follow up at a future treatment instead.   [place], [place], [facility] Pager [phone] Voicemail [phone]

## 2020-04-13 NOTE — Patient Instructions (Signed)
Rehydration, Adult Rehydration is the replacement of body fluids, salts, and minerals (electrolytes) that are lost during dehydration. Dehydration is when there is not enough water or other fluids in the body. This happens when you lose more fluids than you take in. Common causes of dehydration include:  Not drinking enough fluids. This can occur when you are ill or doing activities that require a lot of energy, especially in hot weather.  Conditions that cause loss of water or other fluids, such as diarrhea, vomiting, sweating, or urinating a lot.  Other illnesses, such as fever or infection.  Certain medicines, such as those that remove excess fluid from the body (diuretics). Symptoms of mild or moderate dehydration may include thirst, dry lips and mouth, and dizziness. Symptoms of severe dehydration may include increased heart rate, confusion, fainting, and not urinating. For severe dehydration, you may need to get fluids through an IV at the hospital. For mild or moderate dehydration, you can usually rehydrate at home by drinking certain fluids as told by your health care provider. What are the risks? Generally, rehydration is safe. However, taking in too much fluid (overhydration) can be a problem. This is rare. Overhydration can cause an electrolyte imbalance, kidney failure, or a decrease in salt (sodium) levels in the body. Supplies needed You will need an oral rehydration solution (ORS) if your health care provider tells you to use one. This is a drink to treat dehydration. It can be found in pharmacies and retail stores. How to rehydrate Fluids Follow instructions from your health care provider for rehydration. The kind of fluid and the amount you should drink depend on your condition. In general, you should choose drinks that you prefer.  If told by your health care provider, drink an ORS. ? Make an ORS by following instructions on the package. ? Start by drinking small amounts,  about  cup (120 mL) every 5-10 minutes. ? Slowly increase how much you drink until you have taken the amount recommended by your health care provider.  Drink enough clear fluids to keep your urine pale yellow. If you were told to drink an ORS, finish it first, then start slowly drinking other clear fluids. Drink fluids such as: ? Water. This includes sparkling water and flavored water. Drinking only water can lead to having too little sodium in your body (hyponatremia). Follow the advice of your health care provider. ? Water from ice chips you suck on. ? Fruit juice with water you add to it (diluted). ? Sports drinks. ? Hot or cold herbal teas. ? Broth-based soups. ? Milk or milk products. Food Follow instructions from your health care provider about what to eat while you rehydrate. Your health care provider may recommend that you slowly begin eating regular foods in small amounts.  Eat foods that contain a healthy balance of electrolytes, such as bananas, oranges, potatoes, tomatoes, and spinach.  Avoid foods that are greasy or contain a lot of sugar. In some cases, you may get nutrition through a feeding tube that is passed through your nose and into your stomach (nasogastric tube, or NG tube). This may be done if you have uncontrolled vomiting or diarrhea.   Beverages to avoid Certain beverages may make dehydration worse. While you rehydrate, avoid drinking alcohol.   How to tell if you are recovering from dehydration You may be recovering from dehydration if:  You are urinating more often than before you started rehydrating.  Your urine is pale yellow.  Your energy level   improves.  You vomit less frequently.  You have diarrhea less frequently.  Your appetite improves or returns to normal.  You feel less dizzy or less light-headed.  Your skin tone and color start to look more normal. Follow these instructions at home:  Take over-the-counter and prescription medicines only  as told by your health care provider.  Do not take sodium tablets. Doing this can lead to having too much sodium in your body (hypernatremia). Contact a health care provider if:  You continue to have symptoms of mild or moderate dehydration, such as: ? Thirst. ? Dry lips. ? Slightly dry mouth. ? Dizziness. ? Dark urine or less urine than normal. ? Muscle cramps.  You continue to vomit or have diarrhea. Get help right away if you:  Have symptoms of dehydration that get worse.  Have a fever.  Have a severe headache.  Have been vomiting and the following happens: ? Your vomiting gets worse or does not go away. ? Your vomit includes blood or green matter (bile). ? You cannot eat or drink without vomiting.  Have problems with urination or bowel movements, such as: ? Diarrhea that gets worse or does not go away. ? Blood in your stool (feces). This may cause stool to look black and tarry. ? Not urinating, or urinating only a small amount of very dark urine, within 6-8 hours.  Have trouble breathing.  Have symptoms that get worse with treatment. These symptoms may represent a serious problem that is an emergency. Do not wait to see if the symptoms will go away. Get medical help right away. Call your local emergency services (911 in the [place] Do not drive yourself to the hospital. Summary  Rehydration is the replacement of body fluids and minerals (electrolytes) that are lost during dehydration.  Follow instructions from your health care provider for rehydration. The kind of fluid and amount you should drink depend on your condition.  Slowly increase how much you drink until you have taken the amount recommended by your health care provider.  Contact your health care provider if you continue to show signs of mild or moderate dehydration. This information is not intended to replace advice given to you by your health care provider. Make sure you discuss any questions you have with  your health care provider. Document Revised: 04/17/2019 Document Reviewed: 02/25/2019 Elsevier Patient Education  2021 [employer]  

## 2020-04-14 ENCOUNTER — Encounter: Payer: Self-pay | Admitting: General Practice

## 2020-04-14 NOTE — Progress Notes (Signed)
[place] Spiritual Care Note  Because Brooke [name] has only very brief appointments on her calendar at this time (less suitable for visits), I called and left voicemail with my direct dial number, encouraging return call, for follow-up support.   [place], [place], [facility] Pager [phone] Voicemail [phone]

## 2020-04-15 ENCOUNTER — Telehealth: Payer: Self-pay | Admitting: Oncology

## 2020-04-15 ENCOUNTER — Other Ambulatory Visit: Payer: Self-pay

## 2020-04-15 ENCOUNTER — Other Ambulatory Visit: Payer: Self-pay | Admitting: Family Medicine

## 2020-04-15 ENCOUNTER — Inpatient Hospital Stay: Payer: Medicare PPO

## 2020-04-15 DIAGNOSIS — C787 Secondary malignant neoplasm of liver and intrahepatic bile duct: Secondary | ICD-10-CM | POA: Diagnosis not present

## 2020-04-15 DIAGNOSIS — Z9012 Acquired absence of left breast and nipple: Secondary | ICD-10-CM | POA: Diagnosis not present

## 2020-04-15 DIAGNOSIS — C50912 Malignant neoplasm of unspecified site of left female breast: Secondary | ICD-10-CM | POA: Diagnosis not present

## 2020-04-15 DIAGNOSIS — Z95828 Presence of other vascular implants and grafts: Secondary | ICD-10-CM

## 2020-04-15 DIAGNOSIS — D649 Anemia, unspecified: Secondary | ICD-10-CM | POA: Diagnosis not present

## 2020-04-15 DIAGNOSIS — Z5111 Encounter for antineoplastic chemotherapy: Secondary | ICD-10-CM | POA: Diagnosis not present

## 2020-04-15 DIAGNOSIS — M8580 Other specified disorders of bone density and structure, unspecified site: Secondary | ICD-10-CM | POA: Diagnosis not present

## 2020-04-15 DIAGNOSIS — R0902 Hypoxemia: Secondary | ICD-10-CM

## 2020-04-15 DIAGNOSIS — C7800 Secondary malignant neoplasm of unspecified lung: Secondary | ICD-10-CM

## 2020-04-15 DIAGNOSIS — E860 Dehydration: Secondary | ICD-10-CM | POA: Diagnosis not present

## 2020-04-15 DIAGNOSIS — C7951 Secondary malignant neoplasm of bone: Secondary | ICD-10-CM | POA: Diagnosis not present

## 2020-04-15 DIAGNOSIS — Z170 Estrogen receptor positive status [ER+]: Secondary | ICD-10-CM | POA: Diagnosis not present

## 2020-04-15 MED ORDER — PULSE OXIMETER FOR FINGER MISC: 0 refills | Status: DC

## 2020-04-15 MED ORDER — HEPARIN SOD (PORK) LOCK FLUSH 100 UNIT/ML IV SOLN
500.0000 [IU] | Freq: Once | INTRAVENOUS | Status: AC
  Administered 2020-04-15: 500 [IU] via INTRAVENOUS
  Filled 2020-04-15: qty 5

## 2020-04-15 MED ORDER — SODIUM CHLORIDE 0.9% FLUSH
10.0000 mL | INTRAVENOUS | Status: DC | PRN
  Administered 2020-04-15: 10 mL via INTRAVENOUS
  Filled 2020-04-15: qty 10

## 2020-04-15 NOTE — Telephone Encounter (Signed)
Scheduled appts per 2/14 los. Pt confirmed appt date and time.  

## 2020-04-15 NOTE — Progress Notes (Unsigned)
I called Auria to check in on her. It looked like she was having a difficult time with all she was going through.   [facility] to get home oxygen situation worked out and visit didn't go well. Per Stanton Kidney - she doesn't want concentrator at home; it was too noisy. Per [facility], patient refused all the continuous oxygen types, which is what she needed based on evaluation with resting O2 at 84%. She was not able to trigger the oxygen for the portable pulse oxygen, so they told her she didn't qualify for this. She can't lift the tank that they showed her - 8+ pounds. So she left feeling very disappointed.     She isn't sure what her O2 level is running at home; I sent in a pulse ox for home use. I tried to talk with her about oxygen level goals and that she may feel better with continuous oxygen, but she is adamant about not wanting compressor at house and is hesitant even to consider continuous oxygen, although we discussed risks of hypoxia including confusion, weakness, etc.   When I called [facility], they said they had option that was smaller - 5.3 tank. (D/E) that could have roller that could be used for continuous oxygen. She would consider this.   She is wanting to see about different company; other possible options. She doesn't want compressor even if quiet. I am not certain if there is another pulse oxygen delivery device by [employer] that she could use, so I think talking with another company is reasonable to see what they might offer.

## 2020-04-15 NOTE — Patient Instructions (Addendum)
PICC Home Care Guide A peripherally inserted central catheter (PICC) is a form of IV access that allows medicines and IV fluids to be quickly distributed throughout the body. The PICC is a long, thin, flexible tube (catheter) that is inserted into a vein in the upper arm. The catheter ends in a large vein in the chest (superior vena cava, or SVC). After the PICC is inserted, a chest X-ray may be done to make sure that it is in the correct place. A PICC may be placed for different reasons, such as:  To give medicines and liquid nutrition.  To give IV fluids and blood products.  If there is trouble placing a peripheral intravenous (PIV) catheter. If taken care of properly, a PICC can remain in place for several months. Having a PICC can also allow a person to go home from the hospital sooner. Medicine and PICC care can be managed at home by a family member, caregiver, or home health care team. What are the risks? Generally, having a PICC is safe. However, problems may occur, including:  A blood clot (thrombus) forming in or at the tip of the PICC.  A blood clot forming in a vein (deep vein thrombosis) or traveling to the lung (pulmonary embolism).  Inflammation of the vein (phlebitis) in which the PICC is placed.  Infection. Central line associated blood stream infection (CLABSI) is a serious infection that often requires hospitalization.  PICC movement (malposition). The PICC tip may move from its original position due to excessive physical activity, forceful coughing, sneezing, or vomiting.  A break or cut in the PICC. It is important not to use scissors near the PICC.  Nerve or tendon irritation or injury during PICC insertion. How to take care of your PICC Preventing problems  You and any caregivers should wash your hands often with soap. Wash hands: ? Before touching the PICC line or the infusion device. ? Before changing a bandage (dressing).  Flush the PICC as told by your  health care provider. Let your health care provider know right away if the PICC is hard to flush or does not flush. Do not use force to flush the PICC.  Do not use a syringe that is less than 10 mL to flush the PICC.  Avoid blood pressure checks on the arm in which the PICC is placed.  Never pull or tug on the PICC.  Do not take the PICC out yourself. Only a trained clinical professional should remove the PICC.  Use clean and sterile supplies only. Keep the supplies in a dry place. Do not reuse needles, syringes, or any other supplies. Doing that can lead to infection.  Keep pets and children away from your PICC line.  Check the PICC insertion site every day for signs of infection. Check for: ? Leakage. ? Redness, swelling, or pain. ? Fluid or blood. ? Warmth. ? Pus or a bad smell. PICC dressing care  Keep your PICC bandage (dressing) clean and dry to prevent infection.  Do not take baths, swim, or use a hot tub until your health care provider approves. Ask your health care provider if you can take showers. You may only be allowed to take sponge baths for bathing. When you are allowed to shower: ? Ask your health care provider to teach you how to wrap the PICC line. ? Cover the PICC line with clear plastic wrap and tape to keep it dry while showering.  Follow instructions from your health care provider about   how to take care of your insertion site and dressing. Make sure you: ? Wash your hands with soap and water before you change your bandage (dressing). If soap and water are not available, use hand sanitizer. ? Change your dressing as told by your health care provider. ? Leave stitches (sutures), skin glue, or adhesive strips in place. These skin closures may need to stay in place for 2 weeks or longer. If adhesive strip edges start to loosen and curl up, you may trim the loose edges. Do not remove adhesive strips completely unless your health care provider tells you to do  that.  Change your PICC dressing if it becomes loose or wet. General instructions  Carry your PICC identification card or wear a medical alert bracelet at all times.  Keep the tube clamped at all times, unless it is being used.  Carry a smooth-edge clamp with you at all times to place on the tube if it breaks.  Do not use scissors or sharp objects near the tube.  You may bend your arm and move it freely. If your PICC is near or at the bend of your elbow, avoid activity with repeated motion at the elbow.  Avoid lifting heavy objects as told by your health care provider.  Keep all follow-up visits as told by your health care provider. This is important.   Disposal of supplies  Throw away any syringes in a disposal container that is meant for sharp items (sharps container). You can buy a sharps container from a pharmacy, or you can make one by using an empty hard plastic bottle with a cover.  Place any used dressings or infusion bags into a plastic bag. Throw that bag in the trash. Contact a health care provider if:  You have pain in your arm, ear, face, or teeth.  You have a fever or chills.  You have redness, swelling, or pain around the insertion site.  You have fluid or blood coming from the insertion site.  Your insertion site feels warm to the touch.  You have pus or a bad smell coming from the insertion site.  Your skin feels hard and raised around the insertion site. Get help right away if:  Your PICC is accidentally pulled all the way out. If this happens, cover the insertion site with a bandage or gauze dressing. Do not throw the PICC away. Your health care provider will need to check it.  Your PICC was tugged or pulled and has partially come out. Do not  push the PICC back in.  You cannot flush the PICC, it is hard to flush, or the PICC leaks around the insertion site when it is flushed.  You hear a "flushing" sound when the PICC is flushed.  You feel your  heart racing or skipping beats.  There is a hole or tear in the PICC.  You have swelling in the arm in which the PICC was inserted.  You have a red streak going up your arm from where the PICC was inserted. Summary  A peripherally inserted central catheter (PICC) is a long, thin, flexible tube (catheter) that is inserted into a vein in the upper arm.  The PICC is inserted using a sterile technique by a specially trained nurse or physician. Only a trained clinical professional should remove it.  Keep your PICC identification card with you at all times.  Avoid blood pressure checks on the arm in which the PICC is placed.  If cared for   properly, a PICC can remain in place for several months. Having a PICC can also allow a person to go home from the hospital sooner. This information is not intended to replace advice given to you by your health care provider. Make sure you discuss any questions you have with your health care provider. Document Revised: 06/26/2019 Document Reviewed: 06/26/2019 Elsevier Patient Education  2021 Elsevier Inc.  

## 2020-04-16 ENCOUNTER — Telehealth: Payer: Self-pay | Admitting: *Deleted

## 2020-04-16 NOTE — Telephone Encounter (Signed)
Per PCP, an order was entered for oxygen and the pt would prefer not to use [facility].  I called [facility] at [phone] and spoke with Caryl Pina.  Caryl Pina stated he can see the order in Epic and a few things should be added.  He stated in the "length of need" to change this to "lifetime", in "mode or route" add to 2L and to also under  "conserving device needed" to check "yes" and in comments to add: P.O.C for portable oxygen concentrator.  Message sent to PCP for a new order.

## 2020-04-17 ENCOUNTER — Other Ambulatory Visit: Payer: Self-pay

## 2020-04-17 ENCOUNTER — Inpatient Hospital Stay: Payer: Medicare PPO

## 2020-04-17 DIAGNOSIS — Z170 Estrogen receptor positive status [ER+]: Secondary | ICD-10-CM

## 2020-04-17 DIAGNOSIS — C7951 Secondary malignant neoplasm of bone: Secondary | ICD-10-CM | POA: Diagnosis not present

## 2020-04-17 DIAGNOSIS — C50912 Malignant neoplasm of unspecified site of left female breast: Secondary | ICD-10-CM | POA: Diagnosis not present

## 2020-04-17 DIAGNOSIS — M8580 Other specified disorders of bone density and structure, unspecified site: Secondary | ICD-10-CM | POA: Diagnosis not present

## 2020-04-17 DIAGNOSIS — Z452 Encounter for adjustment and management of vascular access device: Secondary | ICD-10-CM | POA: Insufficient documentation

## 2020-04-17 DIAGNOSIS — Z9012 Acquired absence of left breast and nipple: Secondary | ICD-10-CM | POA: Diagnosis not present

## 2020-04-17 DIAGNOSIS — D649 Anemia, unspecified: Secondary | ICD-10-CM | POA: Diagnosis not present

## 2020-04-17 DIAGNOSIS — C50812 Malignant neoplasm of overlapping sites of left female breast: Secondary | ICD-10-CM

## 2020-04-17 DIAGNOSIS — E860 Dehydration: Secondary | ICD-10-CM | POA: Diagnosis not present

## 2020-04-17 DIAGNOSIS — Z5111 Encounter for antineoplastic chemotherapy: Secondary | ICD-10-CM | POA: Diagnosis not present

## 2020-04-17 DIAGNOSIS — C787 Secondary malignant neoplasm of liver and intrahepatic bile duct: Secondary | ICD-10-CM | POA: Diagnosis not present

## 2020-04-17 MED ORDER — HEPARIN SOD (PORK) LOCK FLUSH 100 UNIT/ML IV SOLN
250.0000 [IU] | Freq: Once | INTRAVENOUS | Status: AC
  Administered 2020-04-17: 250 [IU]
  Filled 2020-04-17: qty 5

## 2020-04-17 MED ORDER — SODIUM CHLORIDE 0.9% FLUSH
10.0000 mL | Freq: Once | INTRAVENOUS | Status: AC
  Administered 2020-04-17: 10 mL
  Filled 2020-04-17: qty 10

## 2020-04-17 NOTE — Telephone Encounter (Signed)
Per Tiffany at [facility], they do not have staff to evaluate a patient as requested below.  Message sent to PCP.

## 2020-04-17 NOTE — Telephone Encounter (Signed)
Hi Dr. [name] - I had checked in with [facility] on Wednesday just after reviewing your notes since she has been having a hard time. I was trying to help her with getting oxygen, but there have been some bumps in the road with this.   What is the best way to try and get her some portable oxygen? The issue when she went to [facility] for evaluation is that she wasn't breathing in strongly enough to trigger the pulsed oxygen? The small continuous oxygen tanks (and she only wants a small tank) will run out too quickly. [employer] didn't seem to have other options when I spoke with them. And the other company, [facility], doesn't have anyone that can do this evaluation in her home. She is adamant about not wanting a compressor in her home and does not want to wear oxygen out in public unless it is a small tank she can carry. Are you aware of a company that has alternatives for the pulse oxygen? Or can evaluate her with this? I feel there must be an easier pulse delivery system, but just not sure where to go from here. Appreciate your help! (and if perhaps pulmonology would be the direction to go I can get that set up for her)

## 2020-04-17 NOTE — Telephone Encounter (Signed)
Can you check with them to see if they will evaluate her to make sure that she can actually trigger the oxygen with the concentrator for the portable? I'm not sure how many different devices there are, but at [facility], the only portable oxygen with "pulse" she was not able to inspire hard enough to trigger breath in. I just want to make sure i'm not putting in an order for the same thing that she already "failed" at [facility]. Please let me know and I will change order then.

## 2020-04-20 ENCOUNTER — Telehealth: Payer: Self-pay | Admitting: Family Medicine

## 2020-04-20 ENCOUNTER — Inpatient Hospital Stay: Payer: Medicare PPO | Admitting: Oncology

## 2020-04-20 ENCOUNTER — Emergency Department (HOSPITAL_COMMUNITY): Payer: Medicare PPO

## 2020-04-20 ENCOUNTER — Inpatient Hospital Stay (HOSPITAL_COMMUNITY)
Admission: EM | Admit: 2020-04-20 | Discharge: 2020-04-24 | DRG: 690 | Disposition: A | Discharge status: Home / Self Care | Payer: Medicare PPO | Attending: Family Medicine | Admitting: Family Medicine

## 2020-04-20 ENCOUNTER — Inpatient Hospital Stay: Payer: Medicare PPO

## 2020-04-20 ENCOUNTER — Other Ambulatory Visit: Payer: Self-pay

## 2020-04-20 ENCOUNTER — Encounter (HOSPITAL_COMMUNITY): Payer: Self-pay | Admitting: Internal Medicine

## 2020-04-20 ENCOUNTER — Other Ambulatory Visit: Payer: Self-pay | Admitting: Oncology

## 2020-04-20 VITALS — BP 145/73 | HR 94 | Temp 97.7°F | Resp 17 | Ht 67.0 in | Wt 184.0 lb

## 2020-04-20 DIAGNOSIS — D849 Immunodeficiency, unspecified: Secondary | ICD-10-CM | POA: Diagnosis present

## 2020-04-20 DIAGNOSIS — E872 Acidosis: Secondary | ICD-10-CM | POA: Diagnosis present

## 2020-04-20 DIAGNOSIS — Z9049 Acquired absence of other specified parts of digestive tract: Secondary | ICD-10-CM

## 2020-04-20 DIAGNOSIS — E669 Obesity, unspecified: Secondary | ICD-10-CM | POA: Diagnosis present

## 2020-04-20 DIAGNOSIS — R197 Diarrhea, unspecified: Secondary | ICD-10-CM | POA: Diagnosis not present

## 2020-04-20 DIAGNOSIS — D696 Thrombocytopenia, unspecified: Secondary | ICD-10-CM | POA: Diagnosis present

## 2020-04-20 DIAGNOSIS — Z888 Allergy status to other drugs, medicaments and biological substances status: Secondary | ICD-10-CM | POA: Diagnosis not present

## 2020-04-20 DIAGNOSIS — E876 Hypokalemia: Secondary | ICD-10-CM | POA: Diagnosis present

## 2020-04-20 DIAGNOSIS — C799 Secondary malignant neoplasm of unspecified site: Secondary | ICD-10-CM | POA: Diagnosis not present

## 2020-04-20 DIAGNOSIS — Z85820 Personal history of malignant melanoma of skin: Secondary | ICD-10-CM | POA: Diagnosis not present

## 2020-04-20 DIAGNOSIS — Z79899 Other long term (current) drug therapy: Secondary | ICD-10-CM

## 2020-04-20 DIAGNOSIS — Z91040 Latex allergy status: Secondary | ICD-10-CM

## 2020-04-20 DIAGNOSIS — Z515 Encounter for palliative care: Secondary | ICD-10-CM | POA: Diagnosis not present

## 2020-04-20 DIAGNOSIS — E559 Vitamin D deficiency, unspecified: Secondary | ICD-10-CM

## 2020-04-20 DIAGNOSIS — N3090 Cystitis, unspecified without hematuria: Secondary | ICD-10-CM | POA: Diagnosis present

## 2020-04-20 DIAGNOSIS — K449 Diaphragmatic hernia without obstruction or gangrene: Secondary | ICD-10-CM | POA: Diagnosis present

## 2020-04-20 DIAGNOSIS — R1310 Dysphagia, unspecified: Secondary | ICD-10-CM | POA: Diagnosis not present

## 2020-04-20 DIAGNOSIS — Z883 Allergy status to other anti-infective agents status: Secondary | ICD-10-CM

## 2020-04-20 DIAGNOSIS — Z66 Do not resuscitate: Secondary | ICD-10-CM | POA: Diagnosis present

## 2020-04-20 DIAGNOSIS — C787 Secondary malignant neoplasm of liver and intrahepatic bile duct: Secondary | ICD-10-CM | POA: Diagnosis present

## 2020-04-20 DIAGNOSIS — B961 Klebsiella pneumoniae [K. pneumoniae] as the cause of diseases classified elsewhere: Secondary | ICD-10-CM | POA: Diagnosis present

## 2020-04-20 DIAGNOSIS — N390 Urinary tract infection, site not specified: Secondary | ICD-10-CM | POA: Diagnosis present

## 2020-04-20 DIAGNOSIS — C50812 Malignant neoplasm of overlapping sites of left female breast: Secondary | ICD-10-CM

## 2020-04-20 DIAGNOSIS — N951 Menopausal and female climacteric states: Secondary | ICD-10-CM

## 2020-04-20 DIAGNOSIS — Z7189 Other specified counseling: Secondary | ICD-10-CM | POA: Diagnosis not present

## 2020-04-20 DIAGNOSIS — D6959 Other secondary thrombocytopenia: Secondary | ICD-10-CM | POA: Diagnosis present

## 2020-04-20 DIAGNOSIS — I10 Essential (primary) hypertension: Secondary | ICD-10-CM | POA: Diagnosis present

## 2020-04-20 DIAGNOSIS — R112 Nausea with vomiting, unspecified: Secondary | ICD-10-CM | POA: Diagnosis present

## 2020-04-20 DIAGNOSIS — Z9889 Other specified postprocedural states: Secondary | ICD-10-CM

## 2020-04-20 DIAGNOSIS — Z822 Family history of deafness and hearing loss: Secondary | ICD-10-CM

## 2020-04-20 DIAGNOSIS — Z20822 Contact with and (suspected) exposure to covid-19: Secondary | ICD-10-CM | POA: Diagnosis present

## 2020-04-20 DIAGNOSIS — E785 Hyperlipidemia, unspecified: Secondary | ICD-10-CM | POA: Diagnosis present

## 2020-04-20 DIAGNOSIS — Z95828 Presence of other vascular implants and grafts: Secondary | ICD-10-CM

## 2020-04-20 DIAGNOSIS — Z170 Estrogen receptor positive status [ER+]: Secondary | ICD-10-CM

## 2020-04-20 DIAGNOSIS — D0361 Melanoma in situ of right upper limb, including shoulder: Secondary | ICD-10-CM

## 2020-04-20 DIAGNOSIS — Z0389 Encounter for observation for other suspected diseases and conditions ruled out: Secondary | ICD-10-CM | POA: Diagnosis not present

## 2020-04-20 DIAGNOSIS — F418 Other specified anxiety disorders: Secondary | ICD-10-CM | POA: Diagnosis present

## 2020-04-20 DIAGNOSIS — Z452 Encounter for adjustment and management of vascular access device: Secondary | ICD-10-CM

## 2020-04-20 DIAGNOSIS — T451X5A Adverse effect of antineoplastic and immunosuppressive drugs, initial encounter: Secondary | ICD-10-CM | POA: Diagnosis present

## 2020-04-20 DIAGNOSIS — Z8049 Family history of malignant neoplasm of other genital organs: Secondary | ICD-10-CM

## 2020-04-20 DIAGNOSIS — C4491 Basal cell carcinoma of skin, unspecified: Secondary | ICD-10-CM

## 2020-04-20 DIAGNOSIS — C7800 Secondary malignant neoplasm of unspecified lung: Secondary | ICD-10-CM | POA: Diagnosis present

## 2020-04-20 DIAGNOSIS — R7401 Elevation of levels of liver transaminase levels: Secondary | ICD-10-CM | POA: Diagnosis present

## 2020-04-20 DIAGNOSIS — C7951 Secondary malignant neoplasm of bone: Secondary | ICD-10-CM | POA: Diagnosis present

## 2020-04-20 DIAGNOSIS — D219 Benign neoplasm of connective and other soft tissue, unspecified: Secondary | ICD-10-CM

## 2020-04-20 DIAGNOSIS — D649 Anemia, unspecified: Secondary | ICD-10-CM | POA: Diagnosis present

## 2020-04-20 DIAGNOSIS — Z803 Family history of malignant neoplasm of breast: Secondary | ICD-10-CM

## 2020-04-20 DIAGNOSIS — Z923 Personal history of irradiation: Secondary | ICD-10-CM

## 2020-04-20 DIAGNOSIS — Z9012 Acquired absence of left breast and nipple: Secondary | ICD-10-CM

## 2020-04-20 DIAGNOSIS — N200 Calculus of kidney: Secondary | ICD-10-CM | POA: Diagnosis present

## 2020-04-20 DIAGNOSIS — Z6827 Body mass index (BMI) 27.0-27.9, adult: Secondary | ICD-10-CM

## 2020-04-20 DIAGNOSIS — D702 Other drug-induced agranulocytosis: Secondary | ICD-10-CM

## 2020-04-20 DIAGNOSIS — E860 Dehydration: Secondary | ICD-10-CM | POA: Diagnosis present

## 2020-04-20 DIAGNOSIS — Z800 Family history of malignant neoplasm of digestive organs: Secondary | ICD-10-CM

## 2020-04-20 DIAGNOSIS — Z8042 Family history of malignant neoplasm of prostate: Secondary | ICD-10-CM

## 2020-04-20 DIAGNOSIS — C801 Malignant (primary) neoplasm, unspecified: Secondary | ICD-10-CM | POA: Diagnosis not present

## 2020-04-20 LAB — CBC WITH DIFFERENTIAL/PLATELET
Abs Immature Granulocytes: 0.44 10*3/uL — ABNORMAL HIGH (ref 0.00–0.07)
Basophils Absolute: 0.1 10*3/uL (ref 0.0–0.1)
Basophils Relative: 1 %
Eosinophils Absolute: 0 10*3/uL (ref 0.0–0.5)
Eosinophils Relative: 0 %
HCT: 32.5 % — ABNORMAL LOW (ref 36.0–46.0)
Hemoglobin: 10.6 g/dL — ABNORMAL LOW (ref 12.0–15.0)
Immature Granulocytes: 6 %
Lymphocytes Relative: 9 %
Lymphs Abs: 0.7 10*3/uL (ref 0.7–4.0)
MCH: 28.3 pg (ref 26.0–34.0)
MCHC: 32.6 g/dL (ref 30.0–36.0)
MCV: 86.7 fL (ref 80.0–100.0)
Monocytes Absolute: 0.6 10*3/uL (ref 0.1–1.0)
Monocytes Relative: 8 %
Neutro Abs: 5.5 10*3/uL (ref 1.7–7.7)
Neutrophils Relative %: 76 %
Platelets: 83 10*3/uL — ABNORMAL LOW (ref 150–400)
RBC: 3.75 MIL/uL — ABNORMAL LOW (ref 3.87–5.11)
RDW: 22 % — ABNORMAL HIGH (ref 11.5–15.5)
WBC: 7.3 10*3/uL (ref 4.0–10.5)
nRBC: 4.7 % — ABNORMAL HIGH (ref 0.0–0.2)

## 2020-04-20 LAB — COMPREHENSIVE METABOLIC PANEL
ALT: 37 U/L (ref 0–44)
AST: 95 U/L — ABNORMAL HIGH (ref 15–41)
Albumin: 3 g/dL — ABNORMAL LOW (ref 3.5–5.0)
Alkaline Phosphatase: 251 U/L — ABNORMAL HIGH (ref 38–126)
Anion gap: 16 — ABNORMAL HIGH (ref 5–15)
BUN: 10 mg/dL (ref 8–23)
CO2: 20 mmol/L — ABNORMAL LOW (ref 22–32)
Calcium: 7.9 mg/dL — ABNORMAL LOW (ref 8.9–10.3)
Chloride: 102 mmol/L (ref 98–111)
Creatinine, Ser: 0.7 mg/dL (ref 0.44–1.00)
GFR, Estimated: >60 mL/min (ref >60)
Glucose, Bld: 113 mg/dL — ABNORMAL HIGH (ref 70–99)
Potassium: 3.5 mmol/L (ref 3.5–5.1)
Sodium: 138 mmol/L (ref 135–145)
Total Bilirubin: 1.7 mg/dL — ABNORMAL HIGH (ref 0.3–1.2)
Total Protein: 5.5 g/dL — ABNORMAL LOW (ref 6.5–8.1)

## 2020-04-20 LAB — URINALYSIS, ROUTINE W REFLEX MICROSCOPIC
Glucose, UA: NEGATIVE mg/dL
Ketones, ur: 20 mg/dL — AB
Nitrite: NEGATIVE
Protein, ur: 100 mg/dL — AB
RBC / HPF: >50 RBC/hpf — ABNORMAL HIGH (ref 0–5)
Specific Gravity, Urine: 1.019 (ref 1.005–1.030)
WBC, UA: >50 WBC/hpf — ABNORMAL HIGH (ref 0–5)
pH: 5 (ref 5.0–8.0)

## 2020-04-20 LAB — HIV ANTIBODY (ROUTINE TESTING W REFLEX): HIV Screen 4th Generation wRfx: NONREACTIVE

## 2020-04-20 LAB — MAGNESIUM: Magnesium: 2.1 mg/dL (ref 1.7–2.4)

## 2020-04-20 MED ORDER — LACTATED RINGERS IV BOLUS
1000.0000 mL | Freq: Once | INTRAVENOUS | Status: AC
  Administered 2020-04-20: 1000 mL via INTRAVENOUS

## 2020-04-20 MED ORDER — LACTATED RINGERS IV SOLN: INTRAVENOUS | Status: DC

## 2020-04-20 MED ORDER — PANTOPRAZOLE SODIUM 40 MG IV SOLR
40.0000 mg | INTRAVENOUS | Status: DC
  Administered 2020-04-20 – 2020-04-21 (×2): 40 mg via INTRAVENOUS
  Filled 2020-04-20 (×2): qty 40

## 2020-04-20 MED ORDER — LORAZEPAM 0.5 MG PO TABS
0.5000 mg | ORAL_TABLET | Freq: Four times a day (QID) | ORAL | Status: DC | PRN
  Administered 2020-04-20: 0.5 mg via ORAL
  Filled 2020-04-20: qty 1

## 2020-04-20 MED ORDER — ACETAMINOPHEN 325 MG PO TABS
650.0000 mg | ORAL_TABLET | Freq: Four times a day (QID) | ORAL | Status: DC | PRN
  Filled 2020-04-20: qty 2

## 2020-04-20 MED ORDER — SODIUM CHLORIDE 0.9% FLUSH
10.0000 mL | Freq: Once | INTRAVENOUS | Status: AC
  Administered 2020-04-20: 10 mL
  Filled 2020-04-20: qty 10

## 2020-04-20 MED ORDER — METOCLOPRAMIDE HCL 5 MG/ML IJ SOLN
5.0000 mg | Freq: Three times a day (TID) | INTRAMUSCULAR | Status: DC
  Administered 2020-04-20 – 2020-04-22 (×5): 5 mg via INTRAVENOUS
  Filled 2020-04-20 (×5): qty 2

## 2020-04-20 MED ORDER — PROCHLORPERAZINE EDISYLATE 10 MG/2ML IJ SOLN
10.0000 mg | Freq: Once | INTRAMUSCULAR | Status: AC
  Administered 2020-04-20: 10 mg via INTRAVENOUS
  Filled 2020-04-20: qty 2

## 2020-04-20 MED ORDER — MORPHINE SULFATE (PF) 2 MG/ML IV SOLN: 2.0000 mg | INTRAVENOUS | Status: DC | PRN

## 2020-04-20 MED ORDER — ACETAMINOPHEN 650 MG RE SUPP: 650.0000 mg | Freq: Four times a day (QID) | RECTAL | Status: DC | PRN

## 2020-04-20 NOTE — Progress Notes (Signed)
[place]  [phone]) [phone] Fax:(336) [phone]    ID: Brooke [name] DOB: [birth year]  MR#: [id number]  MLJ#:[id number]  Patient Care Team: [name], MD as PCP - General (Family Medicine) Rolm Bookbinder, MD as Attending Physician (Dermatology) Rya Rausch, Virgie Dad, MD as Consulting Physician (Oncology) Kyung Rudd, MD as Consulting Physician (Radiation Oncology) Jovita Kussmaul, MD as Consulting Physician (General Surgery) Irene Limbo, MD as Consulting Physician (Plastic Surgery) Delice Bison, Charlestine Massed, NP as Nurse Practitioner (Hematology and Oncology) [name], MD as Consulting Physician (Obstetrics and Gynecology) Laurey Morale, MD as Consulting Physician (Family Medicine) OTHER MD:   CHIEF COMPLAINT: Estrogen receptor positive breast cancer (s/p left mastectomy) now widely metastatic  CURRENT TREATMENT: Weekly paclitaxel as tolerated   INTERVAL HISTORY: Brooke [name] returns today for follow-up and treatment of her now widely disseminated breast cancer, which is weakly estrogen receptor positive.    She started weekly paclitaxel on 03/31/2019. Today would be her 3d dose.  We did not treat her last week to see if giving her an extra week off treatment would make a difference.  She also started zoledronate on 03/19/2020, to be repeated every 12 weeks.  This caused her severe bone pain.  We will address that when we repeat that in April.   REVIEW OF SYSTEMS: Brooke [name] is doing "horrible".  She has not been able to get oxygen because she does not want generator in her very small place.  She says that would be too noisy and too bulky.  She has worked hard to try to get some portable oxygen but even if she did obviously that would only be a very temporary measure.  She tells me she cannot eat anything and has not eaten anything for several days because of constant retching.  She also has diarrhea.  We had requested a C. difficile last week but we never  obtained a sample.  She cannot even keep liquids down.  Things get stuck in her throat on do not want to go down she says.  She is beginning to get swollen ankles.  Currently she is not having unusual headaches, visual changes, cough, or pleurisy.   COVID 19 VACCINATION STATUS: fully vaccinated [employer]), with booster 12/28/2019    HISTORY OF CURRENT ILLNESS: From the original intake note:  Marlicia herself palpated a change in her left breast and brought this to medical attention.  On 01/18/2017 she underwent bilateral diagnostic mammography with tomography and bilateral breast ultrasonography at [facility].  The breast density was category B.  At the most recent exam was from December 2016.  In the right breast upper outer quadrant there was a 1 cm irregular mass associated with punctate calcification.  By ultrasound this was not well-defined, and it was biopsied on 01/26/2017 with tomographic guidance this showed a complex sclerosing lesion with the usual ductal hyperplasia.  The right axilla was sonographically  On the left mammography showed a 4 cm dense mass with indistinct margins in the upper inner quadrant.  Ultrasound defined a 3 cm irregular mass in the upper inner quadrant of the left breast which was palpable.  There was an abnormal lymph node in the left axilla.  Biopsy of the left mass and abnormal lymph node ([id number], on 01/26/2017) showed both to be involved by invasive ductal carcinoma, with some lobular features but E-cadherin positive, estrogen and progesterone receptors both 100% positive, with strong staining intensity, with MIB-1 between 15-20%.  There was no HER-2 amplification, the  signals ratio being 1.31/1.58 and the number per cell 2.30/2.05  The patient's subsequent history is as detailed below.   PAST MEDICAL HISTORY: Past Medical History:  Diagnosis Date  . Anemia   . Anxiety   . Arthritis   . ASCUS of cervix with negative high risk HPV 12/2018  . Bilateral dry eyes    . Breast cancer, left breast ([place])   . Family history of breast cancer 02/02/2017  . Family history of breast cancer   . Family history of colon cancer 02/02/2017  . Family history of colon cancer   . Family history of prostate cancer   . Family history of prostate cancer in father 02/02/2017  . Family history of uterine cancer   . Fibroid   . History of [name] stones   . Hyperlipidemia   . Hypertension   . Melanoma of upper arm ([place]) 2009   RIGHT ARM   . Obesity (BMI 30.0-34.9)   . Osteopenia 09/2017   T score -1.9 max 8% / 0.8% statistically significant decline at right and left hip stable at the spine  . PONV (postoperative nausea and vomiting)   . Squamous carcinoma     of the skin  . UTI (urinary tract infection)   . Varicose veins of both lower extremities   . Vitamin D deficiency 07/2009   LOW VITAMIN D 29  . Vitamin deficiency 07/2008   VITAMIN D LOW 23    PAST SURGICAL HISTORY: Past Surgical History:  Procedure Laterality Date  . ABDOMINAL SURGERY  1975   ovairan cyst   . APPENDECTOMY    . BREAST LUMPECTOMY WITH RADIOACTIVE SEED LOCALIZATION Right 09/25/2017   Procedure: RIGHT BREAST LUMPECTOMY WITH RADIOACTIVE SEED LOCALIZATION;  Surgeon: Jovita Kussmaul, MD;  Location: [place];  Service: General;  Laterality: Right;  . BREAST SURGERY     bio  . CHOLECYSTECTOMY    . DERMOID CYST  EXCISION    . DILATATION & CURETTAGE/HYSTEROSCOPY WITH MYOSURE N/A 04/23/2019   Procedure: DILATATION & CURETTAGE/HYSTEROSCOPY WITH MYOSURE;  Surgeon: [name], MD;  Location: [place];  Service: Gynecology;  Laterality: N/A;  request to follow at 9:30am in [facility] block requests 45 minutes  . DILATION AND CURETTAGE OF UTERUS  1998  . GALLBLADDER SURGERY    . HYSTEROSCOPY    . insertion  port a cath  03/02/2016  . MASTECTOMY MODIFIED RADICAL Left 09/25/2017   Procedure: LEFT MODIFIED RADICAL MASTECTOMY;  Surgeon: Jovita Kussmaul, MD;  Location: [place];   Service: General;  Laterality: Left;  . PORTACATH PLACEMENT N/A 03/02/2017   Procedure: INSERTION PORT-A-CATH;  Surgeon: Jovita Kussmaul, MD;  Location: [place];  Service: General;  Laterality: N/A;  . SKIN BIOPSY    . SKIN CANCER EXCISION  2010,2011   2010-RIGHT ARM, 2011-LEFT LOWER LEG.-DR Rolm Bookbinder DERM. DR. Sarajane Jews IS HER DERM SURGEON.    FAMILY HISTORY Family History  Problem Relation Age of Onset  . Diabetes Father   . Hypertension Father   . Heart disease Father   . Prostate cancer Father 23  . Arthritis Father   . Breast cancer Sister 33       again at age 86, GT results unk  . Skin cancer Sister   . Colon cancer Paternal Grandfather 47  . Hypertension Mother   . Heart failure Mother   . Uterine cancer Sister 75       stage IV  . Breast cancer Cousin  mat first cousin dx under 90  The patient's father was diagnosed with prostate cancer in his 69s and died at age 70 from complications of that disease.  The patient's mother died at age 4 with congestive heart failure.  The patient had no brothers, 3 sisters.  One sister had breast cancer at age 98 and again at age 6.  She has been genetically tested and was found to be BRCA not mutated in addition a paternal grandfather had colon cancer in his 18s.  There is no history of ovarian cancer in the family to the patient's knowledge.   GYNECOLOGIC HISTORY:  Patient's last menstrual period was 03/18/1997. Menarche age 32. The patient is GX P0.  She stopped having periods in 1999 and took hormone replacement approximately 5 years.   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  She is now retired.  She is single and lives alone with no pets.    ADVANCED DIRECTIVES: Not in place   HEALTH MAINTENANCE: Social History   Tobacco Use  . Smoking status: Never Smoker  . Smokeless tobacco: Never Used  Vaping Use  .  Vaping Use: Never used  Substance Use Topics  . Alcohol use: No  . Drug use: No    Colonoscopy: Never  PAP: 12/2019, negative  Bone density: 2017/osteopenia   Allergies  Allergen Reactions  . Neosporin [Neomycin-Bacitracin Zn-Polymyx] Swelling, Rash and Other (See Comments)    Blisters  . Bactrim [Sulfamethoxazole-Trimethoprim]     Made her feel very sick  . Benzalkonium Chloride Other (See Comments)    Redness (Pt is unaware)  It works by killing microorganisms and inhibiting their future growth, and for this reason frequently appears as an ingredient in antibacterial hand wipes, antiseptic creams and anti-itch ointments.  . Latex Rash    Current Outpatient Medications  Medication Sig Dispense Refill  . dexamethasone (DECADRON) 4 MG tablet Take 1 tablet (4 mg total) by mouth daily. 60 tablet 1  . LORazepam (ATIVAN) 0.5 MG tablet Place 1 tablet (0.5 mg total) under the tongue every 8 (eight) hours as needed for anxiety. PT to use for nausea as needed 30 tablet 0  . losartan (COZAAR) 25 MG tablet TAKE 1 TABLET(25 MG) BY MOUTH DAILY (Patient taking differently: Taking 1/2 tablet) 90 tablet 1  . Misc. Devices (PULSE OXIMETER FOR FINGER) MISC Use as needed to monitor oxygen levels 1 each 0  . ondansetron (ZOFRAN-ODT) 4 MG disintegrating tablet Take 1 tablet (4 mg total) by mouth every 8 (eight) hours as needed for nausea or vomiting. 20 tablet 0  . prochlorperazine (COMPAZINE) 10 MG tablet Take 1 tablet (10 mg total) by mouth every 6 (six) hours as needed (Nausea or vomiting). 30 tablet 1  . Propylene Glycol 0.6 % SOLN Place 1 drop into both eyes daily.    . vitamin C (ASCORBIC ACID) 500 MG tablet Take 1,000 mg by mouth daily.     . vitamin E 600 UNIT capsule Take 400 Units by mouth 2 (two) times a week.      No current facility-administered medications for this visit.     OBJECTIVE:  white woman examined in a wheelchair Vitals:   04/20/20 1320  BP: (!) 145/73  Pulse: 94  Resp:  17  Temp: 97.7 F (36.5 C)  SpO2: 97%     Body mass index is 28.82 kg/m.   Wt Readings from Last  3 Encounters:  04/20/20 184 lb (83.5 kg)  04/13/20 188 lb 3.2 oz (85.4 kg)  04/08/20 188 lb 9.6 oz (85.5 kg)  ECOG FS:1 - Symptomatic but completely ambulatory    Wearing a mask Lungs no rales or rhonchi Heart regular rate and rhythm Abd  Neuro: nonfocal, well oriented, appropriate affect Breasts: Deferred  LAB RESULTS:  CMP     Component Value Date/Time   NA 137 04/13/2020 1308   NA 141 03/03/2017 1358   K 3.4 (L) 04/13/2020 1308   K 3.3 (L) 03/03/2017 1358   CL 105 04/13/2020 1308   CO2 19 (L) 04/13/2020 1308   CO2 28 03/03/2017 1358   GLUCOSE 104 (H) 04/13/2020 1308   GLUCOSE 107 03/03/2017 1358   BUN 9 04/13/2020 1308   BUN 12.7 03/03/2017 1358   CREATININE 0.69 04/13/2020 1308   CREATININE 0.83 02/19/2020 0936   CREATININE 0.9 03/03/2017 1358   CALCIUM 7.9 (L) 04/13/2020 1308   CALCIUM 9.4 03/03/2017 1358   PROT 5.4 (L) 04/13/2020 1308   PROT 6.5 03/03/2017 1358   ALBUMIN 3.0 (L) 04/13/2020 1308   ALBUMIN 3.7 03/03/2017 1358   AST 105 (H) 04/13/2020 1308   AST 43 (H) 09/12/2018 1354   AST 23 03/03/2017 1358   ALT 40 04/13/2020 1308   ALT 84 (H) 09/12/2018 1354   ALT 25 03/03/2017 1358   ALKPHOS 220 (H) 04/13/2020 1308   ALKPHOS 71 03/03/2017 1358   BILITOT 1.6 (H) 04/13/2020 1308   BILITOT 0.6 09/12/2018 1354   BILITOT 0.52 03/03/2017 1358   GFRNONAA >60 04/13/2020 1308   GFRNONAA >60 09/12/2018 1354   GFRAA >60 06/12/2019 1401   GFRAA >60 09/12/2018 1354    No results found for: TOTALPROTELP, ALBUMINELP, A1GS, A2GS, BETS, BETA2SER, GAMS, MSPIKE, SPEI  No results found for: KPAFRELGTCHN, LAMBDASER, KAPLAMBRATIO  Lab Results  Component Value Date   WBC 7.3 04/20/2020   NEUTROABS 5.5 04/20/2020   HGB 10.6 (L) 04/20/2020   HCT 32.5 (L) 04/20/2020   MCV 86.7 04/20/2020   PLT 83 (L) 04/20/2020      Chemistry      Component Value Date/Time   NA  137 04/13/2020 1308   NA 141 03/03/2017 1358   K 3.4 (L) 04/13/2020 1308   K 3.3 (L) 03/03/2017 1358   CL 105 04/13/2020 1308   CO2 19 (L) 04/13/2020 1308   CO2 28 03/03/2017 1358   BUN 9 04/13/2020 1308   BUN 12.7 03/03/2017 1358   CREATININE 0.69 04/13/2020 1308   CREATININE 0.83 02/19/2020 0936   CREATININE 0.9 03/03/2017 1358      Component Value Date/Time   CALCIUM 7.9 (L) 04/13/2020 1308   CALCIUM 9.4 03/03/2017 1358   ALKPHOS 220 (H) 04/13/2020 1308   ALKPHOS 71 03/03/2017 1358   AST 105 (H) 04/13/2020 1308   AST 43 (H) 09/12/2018 1354   AST 23 03/03/2017 1358   ALT 40 04/13/2020 1308   ALT 84 (H) 09/12/2018 1354   ALT 25 03/03/2017 1358   BILITOT 1.6 (H) 04/13/2020 1308   BILITOT 0.6 09/12/2018 1354   BILITOT 0.52 03/03/2017 1358       No results found for: LABCA2  No components found for: PFXTKW409  No results for input(s): INR in the last 168 hours.  No results found for: [facility]  Lab Results  Component Value Date   BDZ329 32 03/06/2020    No results found for: JME268  No results found for: TMH962  Lab  Results  Component Value Date   CA2729 983.0 (H) 03/06/2020    No components found for: HGQUANT  Lab Results  Component Value Date   CEA1 744.17 (H) 03/06/2020   /  CEA ([facility])  Date Value Ref Range Status  03/06/2020 744.17 (H) 0.00 - 5.00 ng/mL Final    Comment:    (NOTE) This test was performed using [employer]'s Chemiluminescent Microparticle Immunoassay. Values obtained from different assay methods cannot be used interchangeably. Please note that 5-10% of patients who smoke may see CEA levels up to 6.9 ng/mL. Performed at [facility], [place] [address], [place], [place] [address]      No results found for: AFPTUMOR  No results found for: CHROMOGRNA  No results found for: HGBA, HGBA2QUANT, HGBFQUANT, HGBSQUAN (Hemoglobinopathy evaluation)   No results found for: LDH  Lab Results  Component  Value Date   IRON 52 02/19/2020   TIBC 327 02/19/2020   IRONPCTSAT 16 02/19/2020   (Iron and TIBC)  Lab Results  Component Value Date   FERRITIN 298 (H) 02/19/2020    Urinalysis    Component Value Date/Time   COLORURINE DARK YELLOW 02/19/2020 0947   APPEARANCEUR CLOUDY (A) 02/19/2020 0947   LABSPEC 1.018 02/19/2020 0947   PHURINE 6.0 02/19/2020 0947   GLUCOSEU NEGATIVE 02/19/2020 0947   GLUCOSEU NEGATIVE 11/05/2012 1559   HGBUR 1+ (A) 02/19/2020 0947   BILIRUBINUR neg 08/10/2018 1529   KETONESUR 2+ (A) 02/19/2020 0947   PROTEINUR 2+ (A) 02/19/2020 0947   UROBILINOGEN 0.2 08/10/2018 1529   UROBILINOGEN 0.2 11/05/2012 1559   NITRITE neg 08/10/2018 1529   NITRITE NEGATIVE 05/29/2015 1113   LEUKOCYTESUR Negative 08/10/2018 1529    STUDIES: MR Brain W Wo Contrast  Result Date: 03/26/2020 CLINICAL DATA:  Malignant neoplasm of overlapping sites of left breast in female, estrogen receptor positive. Breast cancer, staging; evaluate for brain metastases or meningeal spread. EXAM: MRI HEAD WITHOUT AND WITH CONTRAST TECHNIQUE: Multiplanar, multiecho pulse sequences of the brain and surrounding structures were obtained without and with intravenous contrast. CONTRAST:  16mL GADAVIST GADOBUTROL 1 MMOL/ML IV SOLN COMPARISON:  Head CT 03/05/2020. FINDINGS: Brain: Mild intermittent motion degradation. Cerebral volume is normal for age. There is pachymeningeal dural thickening and enhancement overlying the right greater than left cerebral convexities. This measures up to 8 mm in thickness overlying the right cerebral convexity (series 17, image 23). Findings are compatible with dural-based metastatic disease. Additionally, there is nodular dural based metastatic disease along the mid falx measuring 4.5 x 1.0 cm (AP X TV). 1 cm enhancing lesion along the left cerebellum which appears dural based (series 16, image 37). Mild surrounding parenchymal edema. No other parenchymal lesions are identified. No  significant cerebral white matter disease. Small chronic infarct within the right cerebellum. There is no acute infarct. No chronic intracranial blood products. No extra-axial fluid collection. No midline shift. Vascular: Expected proximal arterial flow voids. Skull and upper cervical spine: Inhomogeneous T1 hypointense signal throughout the calvarium. Sinuses/Orbits: Visualized orbits show no acute finding. Trace ethmoid and right maxillary sinus mucosal thickening. Other: Numerous nonspecific small lesions within the bilateral scalp and visualized neck. These results will be called to the ordering clinician or representative by the Radiologist Assistant, and communication documented in the PACS or [employer]. IMPRESSION: Pachymeningeal dural thickening and enhancement overlying the right greater than left cerebral convexities measuring up to 8 mm in thickness. Findings are compatible with dural-based metastatic disease. Additional nodular dural-based metastatic disease along  the mid falx measuring 4.5 x 1.0 cm (AP x TV). 1 cm enhancing lesion along the left cerebellum which appears dural-based. This may reflect additional dural-based metastatic disease or a small meningioma. Mild parenchymal edema within the underlying left cerebellum. Inhomogeneous T1 hypointense signal within the bilateral calvarium, highly suspicious for calvarial metastases. Numerous small nonspecific lesions within the bilateral scalp and visualized neck. Direct visualization recommended. Small chronic lacunar infarct within the right cerebellar hemisphere. Electronically Signed   By: Jackey Loge DO   On: 03/26/2020 14:52   IR PICC PLACEMENT RIGHT >5 YRS INC IMG GUIDE  Result Date: 04/10/2020 INDICATION: Patient with history of metastatic breast cancer and poor venous access; central venous access requested for chemotherapy. EXAM: ULTRASOUND AND FLUOROSCOPIC GUIDED PICC LINE INSERTION MEDICATIONS: 1% lidocaine to skin and  subcutaneous tissue ANESTHESIA/SEDATION: None FLUOROSCOPY TIME:  Fluoroscopy Time: 1 minutes 6 seconds (6 mGy). COMPLICATIONS: None immediate. TECHNIQUE: The procedure, risks, benefits, and alternatives were explained to the patient and informed written consent was obtained. A timeout was performed prior to the initiation of the procedure. The right upper extremity was prepped with chlorhexidine in a sterile fashion, and a sterile drape was applied covering the operative field. Maximum barrier sterile technique with sterile gowns and gloves were used for the procedure. A timeout was performed prior to the initiation of the procedure. Local anesthesia was provided with 1% lidocaine. Under direct ultrasound guidance, the right basilic vein was accessed with a micropuncture kit after the overlying soft tissues were anesthetized with 1% lidocaine. An ultrasound image was saved for documentation purposes. A guidewire was advanced to the level of the superior caval-atrial junction for measurement purposes and the PICC line was cut to length. A peel-away sheath was placed and a 47 cm, 5 [place], dual lumen was inserted to level of the superior caval-atrial junction. A post procedure spot fluoroscopic was obtained. The catheter easily aspirated and flushed and was sutured in place. A dressing was placed. The patient tolerated the procedure well without immediate post procedural complication. FINDINGS: After catheter placement, the tip lies within the superior cavoatrial junction the catheter aspirates and flushes normally and is ready for immediate use. IMPRESSION: Successful ultrasound and fluoroscopic guided placement of a right basilic vein approach, 47 cm, 5 French, dual lumen PICC with tip at the superior caval-atrial junction. The PICC line is ready for immediate use. Read by: Jeananne Rama, PA-C Electronically Signed   By: Malachy Moan M.D.   On: 04/10/2020 09:42     ELIGIBLE FOR AVAILABLE RESEARCH PROTOCOL:  no   ASSESSMENT: 70 y.o. [place] woman status post bilateral biopsies 01/26/2017, showing  (1) in the right breast, a complex sclerosing lesion, status post lumpectomy 09/25/2017, with no malignancy identified.  (2) in the left breast, a cT2 pN1 invasive ductal carcinoma (with some lobular features but E-cadherin positive), grade 2, estrogen and progesterone receptor positive, HER-2 not amplified, with an MIB-1 of 15-20%  (a) breast MRI 02/04/2017 suggests a T3 N1 tumor  (3) started neoadjuvant cyclophosphamide/docetaxel 03/03/2017, discontinued after 1 cycle with very poor tolerance  (a) started cyclophosphamide/methotrexate/fluorouracil (CMF) 03/28/2017, repeated x7 cycles, last dose 08/01/2017  (4) status post left modified radical mastectomy 09/25/17 for a ypT5 ypN2, stage IIIA invasive ductal carcinoma, grade 2, again estrogen and progesterone receptor positive, HER-2 not amplified  (5) adjuvant radiation completed 12/21/2017  (a) capecitabine sensitization tolerated only the initial week of radiation (b) Site/dose: The patient initially received a dose of 50.4 Gy in 28 fractions  to the left chest wall and left supraclavicular region. This was delivered using a 3-D conformal, 4 field technique. The patient then received a boost to the mastectomy scar. This delivered an additional 10 Gy in 5 fractions using an en face electron field. The total dose was 60.4 Gy.  ( 6) tamoxifen started 03/09/2018, discontinued January 2022 with metastatic progression  (7) genetics testing 12/12/2017 through the Multi-Gene Panel offered by [facility] found no deleterious mutations in AIP, ALK, APC, ATM, AXIN2,BAP1,  BARD1, BLM, BMPR1A, BRCA1, BRCA2, BRIP1, CASR, CDC73, CDH1, CDK4, CDKN1B, CDKN1C, CDKN2A (p14ARF), CDKN2A (p16INK4a), CEBPA, CHEK2, CTNNA1, DICER1, DIS3L2, EGFR (c.2369C>T, p.Thr790Met variant only), EPCAM (Deletion/duplication testing only), FH, FLCN, GATA2, GPC3, GREM1 (Promoter region  deletion/duplication testing only), HOXB13 (c.251G>A, p.Gly84Glu), HRAS, KIT, MAX, MEN1, MET, MITF (c.952G>A, p.Glu318Lys variant only), MLH1, MSH2, MSH3, MSH6, MUTYH, NBN, NF1, NF2, NTHL1, PALB2, PDGFRA, PHOX2B, PMS2, POLD1, POLE, POT1, PRKAR1A, PTCH1, PTEN, RAD50, RAD51C, RAD51D, RB1, RECQL4, RET, RUNX1, SDHAF2, SDHA (sequence changes only), SDHB, SDHC, SDHD, SMAD4, SMARCA4, SMARCB1, SMARCE1, STK11, SUFU, TERC, TERT, TMEM127, TP53, TSC1, TSC2, VHL, WRN and WT1.   (8) Foundation 1 testing shows stable microsatellite status and 1 mutation/ Mb, TP53 mutations x2  (a) PD-L1 dated July 2019 negative  METASTATIC DISEASE: January 2022 (9) CT angio of the chest 02/27/2020 shows multiple hilar nodes consistent with prior granulomatous infection, multiple small pulmonary nodules, multiple punctate granulomas throughout the liver and spleen, nodular hepatic contour and areas of mottling possibly due to steatosis, 1.6 cm left adrenal nodule  (a) PET scan 03/05/2020 shows mildly to highly hypermetabolic adenopathy, the pulmonary nodules being mildly to not hypermetabolic, a large right hepatic liver lesion with an SUV of 13.2 and additional hypermetabolic right and left lobe foci, the adrenal lesion being hypermetabolic with an SUV of 16.  There are also peritoneal nodules and innumerable foci of bony involvement.  Incidental note was made of small heterogeneous foci of increased uptake in the cerebellum.  The CT images did not show gross abnormality in the visualized brain  (b) on 03/09/2020 CA 27-29 was 983.0, CEA 744.17, the CA 19-9 normal at 32  (c) liver biopsy 03/13/2020 confirms metastati carcinoma, estrogen receptor positive, progesterone and HER 2 negative, with an Mib-1 of 40%  (d) brain MRI 03/26/2020 shows dural involvement including a 1.0 cm dural-based cerebellar lesion  (10) paclitaxel started 03/30/2020, to be repeated weekly as tolerated  (11) zoledronate started 03/19/2020, to be repeated every  12 weeks  (12) foundation 1 pending on the most recent biopsy   PLAN: Brooke [name] is no better after 2 doses of paclitaxel.  She is also no better after being off treatment for an additional week--her last treatment here was 04/06/2020.  Brooke [name] is desperate.  She cannot eat, cannot keep anything down including liquids, has diarrhea, watches herself becoming weaker and more dehydrated and also more terrified at her disease.  She has been running around trying to get a portable oxygen system because she does not want a concentrator or even the large oxygen tanks in her small apartment.  She is concerned about the noise.  She has not been able to get oxygen at home as a result.  I have asked her to talk to her sisters--her sister in the [place] appears to be particularly practical, her sister in [place] from the patient's description does not appear to be very competent or helpful--and the patient has discussed things with them apparently but she has not asked her family to come  and she has very limited support here in town.  I do not think we are going to make any progress with Brooke [name] until we get her a little bit more stable.  She needs to be able to at least take oral medications so we can control her nausea vomiting and diarrhea at home.  She does have a PICC line and possibly we could arrange for fluids at home.  I do not think we can proceed to treatment today as planned.  I think she needs admission for stabilization and then perhaps after 48 hours she could be discharged home with increased support.  If so we could treat her later this week, perhaps on Thursday.  She is agreeable to emergency department evaluation today  Total encounter time 25 minutes.*  Erik Nessel, Virgie Dad, MD  04/20/20 1:50 PM Medical Oncology and Hematology [facility] [place], [place] [address] Tel. [phone]    Fax. [phone]   I, Wilburn Mylar, am acting as scribe for Dr. Virgie Dad.  Jaidee Stipe.  I, Lurline Del MD, have reviewed the above documentation for accuracy and completeness, and I agree with the above.   *Total Encounter Time as defined by the Centers for Medicare and Medicaid Services includes, in addition to the face-to-face time of a patient visit (documented in the note above) non-face-to-face time: obtaining and reviewing outside history, ordering and reviewing medications, tests or procedures, care coordination (communications with other health care professionals or caregivers) and documentation in the medical record.

## 2020-04-20 NOTE — H&P (Signed)
History and Physical    Brooke [name] HYW:[id number] DOB: April 04, 1950 DOA: 04/20/2020  PCP: [name], MD  Patient coming from: Oncology office  Chief Complaint: N/V  HPI: Brooke [name] is a 70 y.o. female with medical history significant of widely metastatic S4 breast cancer, anxiety, HTN. Presenting with intractable N/V. She reports that she's had problems with N/V and poor appetite for months. Her symptoms have worsened in the last 3 - 4 weeks after starting a new chemotherapy agent. She states that she is unable to eat or drink anything. She has tried compazine, ativan and steroids. These have not helped. After seeing her oncologist today, it was recommended that she come to the ED for evaluation.   ED Course: She was started on fluids. She was given compazine. Palliative Care was consulted. TRH was called for admission.   Review of Systems:  Denies CP, palpitations, syncopal episodes. Reports dyspnea, N/V/D, ab pain. Review of systems is otherwise negative for all not mentioned in HPI.   PMHx Past Medical History:  Diagnosis Date  . Anemia   . Anxiety   . Arthritis   . ASCUS of cervix with negative high risk HPV 12/2018  . Bilateral dry eyes   . Breast cancer, left breast ([facility])   . Family history of breast cancer 02/02/2017  . Family history of breast cancer   . Family history of colon cancer 02/02/2017  . Family history of colon cancer   . Family history of prostate cancer   . Family history of prostate cancer in father 02/02/2017  . Family history of uterine cancer   . Fibroid   . History of kidney stones   . Hyperlipidemia   . Hypertension   . Melanoma of upper arm ([place]) 2009   RIGHT ARM   . Obesity (BMI 30.0-34.9)   . Osteopenia 09/2017   T score -1.9 max 8% / 0.8% statistically significant decline at right and left hip stable at the spine  . PONV (postoperative nausea and vomiting)   . Squamous carcinoma     of the skin  . UTI (urinary tract infection)    . Varicose veins of both lower extremities   . Vitamin D deficiency 07/2009   LOW VITAMIN D 29  . Vitamin deficiency 07/2008   VITAMIN D LOW 23    PSHx Past Surgical History:  Procedure Laterality Date  . ABDOMINAL SURGERY  1975   ovairan cyst   . APPENDECTOMY    . BREAST LUMPECTOMY WITH RADIOACTIVE SEED LOCALIZATION Right 09/25/2017   Procedure: RIGHT BREAST LUMPECTOMY WITH RADIOACTIVE SEED LOCALIZATION;  Surgeon: Jovita Kussmaul, MD;  Location: [facility];  Service: General;  Laterality: Right;  . BREAST SURGERY     bio  . CHOLECYSTECTOMY    . DERMOID CYST  EXCISION    . DILATATION & CURETTAGE/HYSTEROSCOPY WITH MYOSURE N/A 04/23/2019   Procedure: DILATATION & CURETTAGE/HYSTEROSCOPY WITH MYOSURE;  Surgeon: [name], MD;  Location: [place];  Service: Gynecology;  Laterality: N/A;  request to follow at 9:30am in [facility] block requests 45 minutes  . DILATION AND CURETTAGE OF UTERUS  1998  . GALLBLADDER SURGERY    . HYSTEROSCOPY    . insertion  port a cath  03/02/2016  . MASTECTOMY MODIFIED RADICAL Left 09/25/2017   Procedure: LEFT MODIFIED RADICAL MASTECTOMY;  Surgeon: Jovita Kussmaul, MD;  Location: [place];  Service: General;  Laterality: Left;  . PORTACATH PLACEMENT N/A 03/02/2017   Procedure:  INSERTION PORT-A-CATH;  Surgeon: Jovita Kussmaul, MD;  Location: [place];  Service: General;  Laterality: N/A;  . SKIN BIOPSY    . SKIN CANCER EXCISION  2010,2011   2010-RIGHT ARM, 2011-LEFT LOWER LEG.-DR Rolm Bookbinder DERM. DR. Sarajane Jews IS HER DERM SURGEON.    SocHx  reports that she has never smoked. She has never used smokeless tobacco. She reports that she does not drink alcohol and does not use drugs.  Allergies  Allergen Reactions  . Neosporin [Neomycin-Bacitracin Zn-Polymyx] Swelling, Rash and Other (See Comments)    Blisters  . Bactrim [Sulfamethoxazole-Trimethoprim]     Made her feel very sick  . Benzalkonium Chloride Other (See Comments)    Redness (Pt is  unaware)  It works by killing microorganisms and inhibiting their future growth, and for this reason frequently appears as an ingredient in antibacterial hand wipes, antiseptic creams and anti-itch ointments.  . Latex Rash    FamHx Family History  Problem Relation Age of Onset  . Diabetes Father   . Hypertension Father   . Heart disease Father   . Prostate cancer Father 72  . Arthritis Father   . Breast cancer Sister 62       again at age 73, GT results unk  . Skin cancer Sister   . Colon cancer Paternal Grandfather 4  . Hypertension Mother   . Heart failure Mother   . Uterine cancer Sister 47       stage IV  . Breast cancer Cousin        mat first cousin dx under 40    Prior to Admission medications   Medication Sig Start Date End Date Taking? Authorizing Provider  dexamethasone (DECADRON) 4 MG tablet Take 1 tablet (4 mg total) by mouth daily. 03/11/20   Magrinat, Virgie Dad, MD  LORazepam (ATIVAN) 0.5 MG tablet Place 1 tablet (0.5 mg total) under the tongue every 8 (eight) hours as needed for anxiety. PT to use for nausea as needed 04/06/20   Magrinat, Virgie Dad, MD  losartan (COZAAR) 25 MG tablet TAKE 1 TABLET(25 MG) BY MOUTH DAILY Patient taking differently: Taking 1/2 tablet 05/09/19   [name], MD  Misc. Devices (PULSE OXIMETER FOR FINGER) MISC Use as needed to monitor oxygen levels 04/15/20   Koberlein, Junell C, MD  ondansetron (ZOFRAN-ODT) 4 MG disintegrating tablet Take 1 tablet (4 mg total) by mouth every 8 (eight) hours as needed for nausea or vomiting. 04/06/20   Magrinat, Virgie Dad, MD  prochlorperazine (COMPAZINE) 10 MG tablet Take 1 tablet (10 mg total) by mouth every 6 (six) hours as needed (Nausea or vomiting). 03/24/20   Magrinat, Virgie Dad, MD  Propylene Glycol 0.6 % SOLN Place 1 drop into both eyes daily.    [provider]  vitamin C (ASCORBIC ACID) 500 MG tablet Take 1,000 mg by mouth daily.     [provider]  vitamin E 600 UNIT capsule  Take 400 Units by mouth 2 (two) times a week.     [provider]    Physical Exam: Vitals:   04/20/20 1442 04/20/20 1443 04/20/20 1500 04/20/20 1530  BP: 133/80  (!) 146/85 (!) 100/59  Pulse: 100  98 93  Resp: 20  (!) 22 (!) 25  Temp: 97.9 F (36.6 C)     TempSrc: Oral     SpO2: 100%  98% 97%  Weight:  83.5 kg    Height:  5\' 9"  (1.753 m)  General: 70 y.o. female resting in bed in NAD Eyes: PERRL, normal sclera ENMT: Nares patent w/o discharge, orophaynx clear, dentition normal, ears w/o discharge/lesions/ulcers Neck: Supple, trachea midline Cardiovascular: tachy, +S1, S2, no m/g/r, equal pulses throughout Respiratory: CTABL, no w/r/r, normal WOB GI: BS+, ND, soft, diffuse tenderness, no masses noted, no organomegaly noted MSK: No c/c; BLE edema Skin: No rashes, bruises, ulcerations noted Neuro: A&O x 3, no focal deficits Psyc: Appropriate interaction and affect, calm/cooperative  Labs on Admission: I have personally reviewed following labs and imaging studies  CBC: Recent Labs  Lab 04/20/20 1305  WBC 7.3  NEUTROABS 5.5  HGB 10.6*  HCT 32.5*  MCV 86.7  PLT 83*   Basic Metabolic Panel: Recent Labs  Lab 04/20/20 1305  NA 138  K 3.5  CL 102  CO2 20*  GLUCOSE 113*  BUN 10  CREATININE 0.70  CALCIUM 7.9*   GFR: Estimated Creatinine Clearance: 76.6 mL/min (by C-G formula based on SCr of 0.7 mg/dL). Liver Function Tests: Recent Labs  Lab 04/20/20 1305  AST 95*  ALT 37  ALKPHOS 251*  BILITOT 1.7*  PROT 5.5*  ALBUMIN 3.0*   No results for input(s): LIPASE, AMYLASE in the last 168 hours. No results for input(s): AMMONIA in the last 168 hours. Coagulation Profile: No results for input(s): INR, PROTIME in the last 168 hours. Cardiac Enzymes: No results for input(s): CKTOTAL, CKMB, CKMBINDEX, TROPONINI in the last 168 hours. BNP (last 3 results) No results for input(s): PROBNP in the last 8760 hours. HbA1C: No results for input(s): HGBA1C in  the last 72 hours. CBG: No results for input(s): GLUCAP in the last 168 hours. Lipid Profile: No results for input(s): CHOL, HDL, LDLCALC, TRIG, CHOLHDL, LDLDIRECT in the last 72 hours. Thyroid Function Tests: No results for input(s): TSH, T4TOTAL, FREET4, T3FREE, THYROIDAB in the last 72 hours. Anemia Panel: No results for input(s): VITAMINB12, FOLATE, FERRITIN, TIBC, IRON, RETICCTPCT in the last 72 hours. Urine analysis:    Component Value Date/Time   COLORURINE DARK YELLOW 02/19/2020 0947   APPEARANCEUR CLOUDY (A) 02/19/2020 0947   LABSPEC 1.018 02/19/2020 0947   PHURINE 6.0 02/19/2020 0947   GLUCOSEU NEGATIVE 02/19/2020 0947   GLUCOSEU NEGATIVE 11/05/2012 1559   HGBUR 1+ (A) 02/19/2020 0947   BILIRUBINUR neg 08/10/2018 1529   KETONESUR 2+ (A) 02/19/2020 0947   PROTEINUR 2+ (A) 02/19/2020 0947   UROBILINOGEN 0.2 08/10/2018 1529   UROBILINOGEN 0.2 11/05/2012 1559   NITRITE neg 08/10/2018 1529   NITRITE NEGATIVE 05/29/2015 1113   LEUKOCYTESUR Negative 08/10/2018 1529    Radiological Exams on Admission: No results found.  Assessment/Plan Intractable N/V     - place in obs, med-surg     - reglan, ativan, IVF, protonix     - CLD ordered     - KUB w/o obstruction; follow  Normocytic anemia Thrombocytopenia     - likely secondary to chemo and liver mets     - no evidence of bleed, follow  Dyspnea? Anxiety     - she's been trying to get O2 delivered at home     - she is not hypoxic on RA now     - her lung exam is benign     - continue to correct N/V and anxiety     - can check d-dimer; if elevated, check CTA PE  S4 Breast CA     - followed by Dr. Griffith Citron; he has been contacted by secure chat     -  currently on taxol, but unable to take today's dose d/t N/V     - widely metastatic cancer; consulting Palliative Care  Elevated LFTs Elevated bilirubin     - secondary to metastatic breat cancer.     - her numbers overall are actually improving; trend for  now  High gap acidosis     - likely d/t intractable vomiting     - fluids, antiemetics, follow  Goals of Care     - remains FULL code     - consult Palliative Care   DVT prophylaxis: TED hose  Code Status: FULL  Family Communication: None at bedside  Consults called: Palliative Care, Onco  Status is: Observation  The patient remains OBS appropriate and will d/c before 2 midnights.  Dispo: The patient is from: Home              Anticipated d/c is to: Home              Anticipated d/c date is: 1 day              Patient currently is not medically stable to d/c.   Difficult to place patient No  Jonnie Finner DO [facility]  If 7PM-7AM, please contact night-coverage [email or web address]  04/20/2020, 4:08 PM

## 2020-04-20 NOTE — Telephone Encounter (Signed)
It looks like Dr. [name] is going to address this with her today. I had included him on the difficulties we were having.

## 2020-04-20 NOTE — ED Notes (Signed)
Pt will be going 1308, Terri: [phone] per Unit Secretary.

## 2020-04-20 NOTE — ED Notes (Signed)
Report given to Terri, RN.

## 2020-04-20 NOTE — ED Triage Notes (Signed)
Pt came from cancer center. Per pt's oncologist, Magranat, MD; Pt needs to be evaluated for possible admission d/t unresolved N/V/D and unable to tolerate PO intake for over approximately 3 weeks. PICC line placed in left arm on 04/06/20. A&O x 4

## 2020-04-20 NOTE — Progress Notes (Unsigned)
I am copying Dr Berenice Bouton note below. They have tried to get [facility] the kind of oxygen she agrees to. We have been unable to obtain that for her and it really does not make sense for her to only have portable O2. I will discuss again with her today.   Hi Dr. [name] - I had checked in with [facility] on Wednesday just after reviewing your notes since she has been having a hard time. I was trying to help her with getting oxygen, but there have been some bumps in the road with this.   What is the best way to try and get her some portable oxygen? The issue when she went to [facility] for evaluation is that she wasn't breathing in strongly enough to trigger the pulsed oxygen? The small continuous oxygen tanks (and she only wants a small tank) will run out too quickly. [employer] didn't seem to have other options when I spoke with them. And the other company, [employer], doesn't have anyone that can do this evaluation in her home. She is adamant about not wanting a compressor in her home and does not want to wear oxygen out in public unless it is a small tank she can carry. Are you aware of a company that has alternatives for the pulse oxygen? Or can evaluate her with this? I feel there must be an easier pulse delivery system, but just not sure where to go from here. Appreciate your help! (and if perhaps pulmonology would be the direction to go I can get that set up for her)      Documentation

## 2020-04-20 NOTE — Progress Notes (Signed)
Met with Brooke [name] in her room. She is distraught by the thought that she may "have no future," and may not be able to tolerate treatment. She realizes there is planning she has not started and that the amount of time she has is not clear at this point, but may be shorter than she thought.  I am hopeful with good support as she is receiving she may improve sufficiently to be able to hydrate and feed herself at home. I would anticipate by weds 2/23 we should know if there is going to be improvement. I have added a c difficile as we have been unable to obtain a sample as outpatient and if present that would be a reversible problem.  I encouraged her to discuss her goals of care and expectations further with the palliative team when they come by. I also encouraged her to consider a limited resuscitation. She is very motivated to continue treatment and if she improves clinically we can certainly consider further taxol. Note she has had no chemo for two weeks and the symptoms she is experiencing (which preceded treatment) are no better.   Am appending her course to date below for reference:  . 70 y.o. Brooke [name] woman status post bilateral biopsies 01/26/2017, showing  (1) in the right breast, a complex sclerosing lesion, status post lumpectomy 09/25/2017, with no malignancy identified.  (2) in the left breast, a cT2 pN1 invasive ductal carcinoma (with some lobular features but E-cadherin positive), grade 2, estrogen and progesterone receptor positive, HER-2 not amplified, with an MIB-1 of 15-20%             (a) breast MRI 02/04/2017 suggests a T3 N1 tumor  (3) started neoadjuvant cyclophosphamide/docetaxel 03/03/2017, discontinued after 1 cycle with very poor tolerance             (a) started cyclophosphamide/methotrexate/fluorouracil (CMF) 03/28/2017, repeated x7 cycles, last dose 08/01/2017  (4) status post left modified radical mastectomy 09/25/17 for a ypT5 ypN2, stage IIIA invasive ductal  carcinoma, grade 2, again estrogen and progesterone receptor positive, HER-2 not amplified  (5) adjuvant radiation completed 12/21/2017             (a) capecitabine sensitization tolerated only the initial week of radiation (b) Site/dose:The patient initially received a dose of 50.4 Gy in 28 fractions to the leftchest wall andleftsupraclavicular region. This was delivered using a 3-D conformal, 4 field technique. The patient then received a boost to the mastectomy scar. This delivered an additional 10 Gy in 5 fractions using an en face electron field. The total dose was 60.4 Gy.  ( 6) tamoxifen started 03/09/2018, discontinued January 2022 with metastatic progression  (7) genetics testing 12/12/2017 through the Multi-Gene Panel offered by [facility] found no deleterious mutations in AIP, ALK, APC, ATM, AXIN2,BAP1,  BARD1, BLM, BMPR1A, BRCA1, BRCA2, BRIP1, CASR, CDC73, CDH1, CDK4, CDKN1B, CDKN1C, CDKN2A (p14ARF), CDKN2A (p16INK4a), CEBPA, CHEK2, CTNNA1, DICER1, DIS3L2, EGFR (c.2369C>T, p.Thr790Met variant only), EPCAM (Deletion/duplication testing only), FH, FLCN, GATA2, GPC3, GREM1 (Promoter region deletion/duplication testing only), HOXB13 (c.251G>A, p.Gly84Glu), HRAS, KIT, MAX, MEN1, MET, MITF (c.952G>A, p.Glu318Lys variant only), MLH1, MSH2, MSH3, MSH6, MUTYH, NBN, NF1, NF2, NTHL1, PALB2, PDGFRA, PHOX2B, PMS2, POLD1, POLE, POT1, PRKAR1A, PTCH1, PTEN, RAD50, RAD51C, RAD51D, RB1, RECQL4, RET, RUNX1, SDHAF2, SDHA (sequence changes only), SDHB, SDHC, SDHD, SMAD4, SMARCA4, SMARCB1, SMARCE1, STK11, SUFU, TERC, TERT, TMEM127, TP53, TSC1, TSC2, VHL, WRN and WT1.   (8) Foundation 1 testing shows stable microsatellite status and 1 mutation/ Mb, TP53 mutations x2             (  a) PD-L1 dated July 2019 negative  METASTATIC DISEASE: January 2022 (9) CT angio of the chest 02/27/2020 shows multiple hilar nodes consistent with prior granulomatous infection, multiple small pulmonary nodules, multiple punctate  granulomas throughout the liver and spleen, nodular hepatic contour and areas of mottling possibly due to steatosis, 1.6 cm left adrenal nodule             (a) PET scan 03/05/2020 shows mildly to highly hypermetabolic adenopathy, the pulmonary nodules being mildly to not hypermetabolic, a large right hepatic liver lesion with an SUV of 13.2 and additional hypermetabolic right and left lobe foci, the adrenal lesion being hypermetabolic with an SUV of 16.  There are also peritoneal nodules and innumerable foci of bony involvement.  Incidental note was made of small heterogeneous foci of increased uptake in the cerebellum.  The CT images did not show gross abnormality in the visualized brain             (b) on 03/09/2020 CA 27-29 was 983.0, CEA 744.17, the CA 19-9 normal at 32             (c) liver biopsy 03/13/2020 confirms metastati carcinoma, estrogen receptor positive, progesterone and HER 2 negative, with an Mib-1 of 40%             (d) brain MRI 03/26/2020 shows dural involvement including a 1.0 cm dural-based cerebellar lesion  (10) paclitaxel started 03/30/2020, repeated 04/07/2019; further treatments as tolerated  (11) zoledronate started 03/19/2020, to be repeated every 12 weeks  (12) foundation 1 pending on the most recent biopsy

## 2020-04-20 NOTE — Telephone Encounter (Signed)
Patient is calling and requesting a call back regarding my chart message, please advise. CB is [phone]

## 2020-04-20 NOTE — ED Provider Notes (Signed)
[facility] DEPT Provider Note   CSN: [id number] Arrival date & time: 04/20/20  1405     History Chief Complaint  Patient presents with  . Abdominal Pain  . Diarrhea  . Emesis    Brooke [name] is a 70 y.o. female.  HPI Patient presents with nausea vomiting diarrhea.  Sent in by oncology, Dr. [name] for admission to the hospital.  Has widely metastatic breast cancer to pretty much everywhere except the brain although does have some intracranial lesions.  Has had nausea vomiting diarrhea since around Thanksgiving.  Worse recently to the point where she has had a PICC line placed.  States everything that she tries to eat is coming up.  States that she is even dry heaving.  No relief with Decadron Ativan Zofran and Compazine at home.  Also having some diarrhea.  Feeling weak overall.  Is short of breath and they have been attempting get oxygen at home but has not been able to get it due to some social issues.  Stool samples also not been sent.  Has some irritation in her buttock area.  Does not have any family here.  His 2 sisters but they do not live around here.    Past Medical History:  Diagnosis Date  . Anemia   . Anxiety   . Arthritis   . ASCUS of cervix with negative high risk HPV 12/2018  . Bilateral dry eyes   . Breast cancer, left breast ([place])   . Family history of breast cancer 02/02/2017  . Family history of breast cancer   . Family history of colon cancer 02/02/2017  . Family history of colon cancer   . Family history of prostate cancer   . Family history of prostate cancer in father 02/02/2017  . Family history of uterine cancer   . Fibroid   . History of kidney stones   . Hyperlipidemia   . Hypertension   . Melanoma of upper arm ([place]) 2009   RIGHT ARM   . Obesity (BMI 30.0-34.9)   . Osteopenia 09/2017   T score -1.9 max 8% / 0.8% statistically significant decline at right and left hip stable at the spine  . PONV (postoperative  nausea and vomiting)   . Squamous carcinoma     of the skin  . UTI (urinary tract infection)   . Varicose veins of both lower extremities   . Vitamin D deficiency 07/2009   LOW VITAMIN D 29  . Vitamin deficiency 07/2008   VITAMIN D LOW 23    Patient Active Problem List   Diagnosis Date Noted  . PICC (peripherally inserted central catheter) in place 04/17/2020  . Bone metastases ([place]) 03/11/2020  . Liver metastases ([place]) 03/05/2020  . Lung metastases ([place]) 03/05/2020  . Postoperative state 04/23/2019  . Superficial basal cell carcinoma (B[facility]) 02/10/2019  . Goals of care, counseling/discussion 12/14/2017  . Family history of uterine cancer   . Family history of prostate cancer   . Melanoma in situ of right upper extremity ([place]) 07/18/2017  . Port-A-Cath in place 04/18/2017  . Drug-induced neutropenia ([place]) 03/09/2017  . Family history of breast cancer 02/02/2017  . Family history of colon cancer 02/02/2017  . Family history of prostate cancer in father 02/02/2017  . Malignant neoplasm of overlapping sites of left breast in female, estrogen receptor positive ([place]) 01/31/2017  . Hx of hot flashes, menopausal, HRT in the past 12/19/2011  . Osteopenia, stable, DEXA 2013, recs to repeat  in 2018 12/19/2011  . Hx of Hyperlipidemia, LDL 144 in 07/2011 12/19/2011  . Vitamin D insufficiency 12/19/2011  . Fibroid     Past Surgical History:  Procedure Laterality Date  . ABDOMINAL SURGERY  1975   ovairan cyst   . APPENDECTOMY    . BREAST LUMPECTOMY WITH RADIOACTIVE SEED LOCALIZATION Right 09/25/2017   Procedure: RIGHT BREAST LUMPECTOMY WITH RADIOACTIVE SEED LOCALIZATION;  Surgeon: Jovita Kussmaul, MD;  Location: [facility];  Service: General;  Laterality: Right;  . BREAST SURGERY     bio  . CHOLECYSTECTOMY    . DERMOID CYST  EXCISION    . DILATATION & CURETTAGE/HYSTEROSCOPY WITH MYOSURE N/A 04/23/2019   Procedure: DILATATION & CURETTAGE/HYSTEROSCOPY WITH MYOSURE;  Surgeon: [name],  MD;  Location: [facility];  Service: Gynecology;  Laterality: N/A;  request to follow at 9:30am in [facility] block requests 45 minutes  . DILATION AND CURETTAGE OF UTERUS  1998  . GALLBLADDER SURGERY    . HYSTEROSCOPY    . insertion  port a cath  03/02/2016  . MASTECTOMY MODIFIED RADICAL Left 09/25/2017   Procedure: LEFT MODIFIED RADICAL MASTECTOMY;  Surgeon: Jovita Kussmaul, MD;  Location: [facility];  Service: General;  Laterality: Left;  . PORTACATH PLACEMENT N/A 03/02/2017   Procedure: INSERTION PORT-A-CATH;  Surgeon: Jovita Kussmaul, MD;  Location: [facility];  Service: General;  Laterality: N/A;  . SKIN BIOPSY    . SKIN CANCER EXCISION  2010,2011   2010-RIGHT ARM, 2011-LEFT LOWER LEG.-DR Rolm Bookbinder DERM. DR. Sarajane Jews IS HER DERM SURGEON.     OB History    Gravida  0   Para  0   Term  0   Preterm  0   AB  0   Living  0     SAB  0   IAB  0   Ectopic  0   Multiple  0   Live Births              Family History  Problem Relation Age of Onset  . Diabetes Father   . Hypertension Father   . Heart disease Father   . Prostate cancer Father 96  . Arthritis Father   . Breast cancer Sister 18       again at age 49, GT results unk  . Skin cancer Sister   . Colon cancer Paternal Grandfather 46  . Hypertension Mother   . Heart failure Mother   . Uterine cancer Sister 24       stage IV  . Breast cancer Cousin        mat first cousin dx under 81    Social History   Tobacco Use  . Smoking status: Never Smoker  . Smokeless tobacco: Never Used  Vaping Use  . Vaping Use: Never used  Substance Use Topics  . Alcohol use: No  . Drug use: No    Home Medications Prior to Admission medications   Medication Sig Start Date End Date Taking? Authorizing Provider  dexamethasone (DECADRON) 4 MG tablet Take 1 tablet (4 mg total) by mouth daily. 03/11/20   Magrinat, Virgie Dad, MD  LORazepam (ATIVAN) 0.5 MG tablet Place 1 tablet (0.5 mg total) under the tongue  every 8 (eight) hours as needed for anxiety. PT to use for nausea as needed 04/06/20   Magrinat, Virgie Dad, MD  losartan (COZAAR) 25 MG tablet TAKE 1 TABLET(25 MG) BY MOUTH DAILY Patient taking differently: Taking 1/2 tablet 05/09/19  [name], MD  Misc. Devices (PULSE OXIMETER FOR FINGER) MISC Use as needed to monitor oxygen levels 04/15/20   Koberlein, Junell C, MD  ondansetron (ZOFRAN-ODT) 4 MG disintegrating tablet Take 1 tablet (4 mg total) by mouth every 8 (eight) hours as needed for nausea or vomiting. 04/06/20   Magrinat, Virgie Dad, MD  prochlorperazine (COMPAZINE) 10 MG tablet Take 1 tablet (10 mg total) by mouth every 6 (six) hours as needed (Nausea or vomiting). 03/24/20   Magrinat, Virgie Dad, MD  Propylene Glycol 0.6 % SOLN Place 1 drop into both eyes daily.    [provider]  vitamin C (ASCORBIC ACID) 500 MG tablet Take 1,000 mg by mouth daily.     [provider]  vitamin E 600 UNIT capsule Take 400 Units by mouth 2 (two) times a week.     [provider]    Allergies    Neosporin [neomycin-bacitracin zn-polymyx], Bactrim [sulfamethoxazole-trimethoprim], Benzalkonium chloride, and Latex  Review of Systems   Review of Systems  Constitutional: Positive for appetite change. Negative for fever.  HENT: Negative for congestion.   Respiratory: Negative for shortness of breath.   Cardiovascular: Positive for leg swelling. Negative for chest pain.  Gastrointestinal: Positive for abdominal pain, diarrhea, nausea and vomiting.  Genitourinary: Negative for flank pain.  Musculoskeletal: Negative for back pain.  Skin: Negative for rash.  Neurological: Positive for weakness.    Physical Exam Updated Vital Signs BP (!) 146/85   Pulse 98   Temp 97.9 F (36.6 C) (Oral)   Resp (!) 22   Ht 5\' 9"  (1.753 m)   Wt 83.5 kg   LMP 03/18/1997   SpO2 98%   BMI 27.17 kg/m   Physical Exam Vitals reviewed.  Constitutional:      Appearance: She is  well-developed.  HENT:     Head: Atraumatic.  Cardiovascular:     Rate and Rhythm: Normal rate and regular rhythm.  Pulmonary:     Breath sounds: No wheezing.  Abdominal:     General: There is no distension.     Hernia: No hernia is present.     Comments: Mild abdominal tenderness without distention.  Skin:    Capillary Refill: Capillary refill takes less than 2 seconds.     Comments: Peripheral edema bilateral lower extremities.  Moderate.  Neurological:     Mental Status: She is alert and oriented to person, place, and time.     ED Results / Procedures / Treatments   Labs (all labs ordered are listed, but only abnormal results are displayed) Labs Reviewed  SARS CORONAVIRUS 2 (TAT 6-24 HRS)    EKG None  Radiology No results found.  Procedures Procedures   Medications Ordered in ED Medications  lactated ringers bolus 1,000 mL (1,000 mLs Intravenous New Bag/Given 04/20/20 1521)  prochlorperazine (COMPAZINE) injection 10 mg (10 mg Intravenous Given 04/20/20 1517)    ED Course  I have reviewed the triage vital signs and the nursing notes.  Pertinent labs & imaging results that were available during my care of the patient were reviewed by me and considered in my medical decision making (see chart for details).    MDM Rules/Calculators/A&P                          Patient sent in for nausea vomiting diarrhea and dehydration.  Has widespread metastatic cancer.  Not tolerating orals at home.  Having some diarrhea but also very  little oral intake.  Has a PICC line but really not getting infusions for it yet.  Has been on Taxol but has only had one course something that was around 3 weeks ago.  Having some social issues at home with getting oxygen.  Sent in by oncology for admission.  I feel patient benefit from IV hydration, adjustment of her antiemetic regimen since she has been on Decadron Ativan Compazine and Zofran and continuing to vomit.  Will get acute abdominal series  to look for obstruction.  Discussed with palliative medicine, who will also see patient.  Will admit to hospitalist Final Clinical Impression(s) / ED Diagnoses Final diagnoses:  Nausea vomiting and diarrhea  Metastatic malignant neoplasm, unspecified site [facility])    Rx / DC Orders ED Discharge Orders    None       Davonna Belling, MD 04/20/20 1525

## 2020-04-20 NOTE — Telephone Encounter (Signed)
Spoke with the pt as I do not see a Mychart message from the pt.  Patient stated she received a text from Dr Ethlyn Gallery on Friday and does not have a way to respond.  I advised the pt there is a new system in place in which text messages are sent to the pt when refills are sent in; however I do not see a refill from Friday either.  Patient asked the status of the order for O2 and I advised her the order has been placed and [employer] is working on this.  Patient stated she wanted to let Dr Ethlyn Gallery know she cannot have a concentrator due to a small living space and would like to talk with Dr Ethlyn Gallery.  Message sent to PCP.

## 2020-04-21 DIAGNOSIS — C7951 Secondary malignant neoplasm of bone: Secondary | ICD-10-CM

## 2020-04-21 DIAGNOSIS — N390 Urinary tract infection, site not specified: Secondary | ICD-10-CM | POA: Diagnosis present

## 2020-04-21 DIAGNOSIS — E559 Vitamin D deficiency, unspecified: Secondary | ICD-10-CM

## 2020-04-21 DIAGNOSIS — C787 Secondary malignant neoplasm of liver and intrahepatic bile duct: Secondary | ICD-10-CM

## 2020-04-21 DIAGNOSIS — C50812 Malignant neoplasm of overlapping sites of left female breast: Secondary | ICD-10-CM

## 2020-04-21 DIAGNOSIS — F418 Other specified anxiety disorders: Secondary | ICD-10-CM

## 2020-04-21 DIAGNOSIS — Z515 Encounter for palliative care: Secondary | ICD-10-CM

## 2020-04-21 DIAGNOSIS — E860 Dehydration: Secondary | ICD-10-CM | POA: Diagnosis present

## 2020-04-21 DIAGNOSIS — Z7189 Other specified counseling: Secondary | ICD-10-CM | POA: Diagnosis not present

## 2020-04-21 DIAGNOSIS — R197 Diarrhea, unspecified: Secondary | ICD-10-CM

## 2020-04-21 DIAGNOSIS — R112 Nausea with vomiting, unspecified: Secondary | ICD-10-CM | POA: Diagnosis not present

## 2020-04-21 DIAGNOSIS — C799 Secondary malignant neoplasm of unspecified site: Secondary | ICD-10-CM

## 2020-04-21 DIAGNOSIS — Z170 Estrogen receptor positive status [ER+]: Secondary | ICD-10-CM

## 2020-04-21 DIAGNOSIS — R7401 Elevation of levels of liver transaminase levels: Secondary | ICD-10-CM | POA: Diagnosis present

## 2020-04-21 LAB — CBC
HCT: 27.9 % — ABNORMAL LOW (ref 36.0–46.0)
Hemoglobin: 8.9 g/dL — ABNORMAL LOW (ref 12.0–15.0)
MCH: 28.7 pg (ref 26.0–34.0)
MCHC: 31.9 g/dL (ref 30.0–36.0)
MCV: 90 fL (ref 80.0–100.0)
Platelets: 80 10*3/uL — ABNORMAL LOW (ref 150–400)
RBC: 3.1 MIL/uL — ABNORMAL LOW (ref 3.87–5.11)
RDW: 22.6 % — ABNORMAL HIGH (ref 11.5–15.5)
WBC: 6.2 10*3/uL (ref 4.0–10.5)
nRBC: 3.4 % — ABNORMAL HIGH (ref 0.0–0.2)

## 2020-04-21 LAB — COMPREHENSIVE METABOLIC PANEL
ALT: 32 U/L (ref 0–44)
AST: 83 U/L — ABNORMAL HIGH (ref 15–41)
Albumin: 2.8 g/dL — ABNORMAL LOW (ref 3.5–5.0)
Alkaline Phosphatase: 186 U/L — ABNORMAL HIGH (ref 38–126)
Anion gap: 15 (ref 5–15)
BUN: 10 mg/dL (ref 8–23)
CO2: 21 mmol/L — ABNORMAL LOW (ref 22–32)
Calcium: 7.8 mg/dL — ABNORMAL LOW (ref 8.9–10.3)
Chloride: 102 mmol/L (ref 98–111)
Creatinine, Ser: 0.81 mg/dL (ref 0.44–1.00)
GFR, Estimated: >60 mL/min (ref >60)
Glucose, Bld: 106 mg/dL — ABNORMAL HIGH (ref 70–99)
Potassium: 3.2 mmol/L — ABNORMAL LOW (ref 3.5–5.1)
Sodium: 138 mmol/L (ref 135–145)
Total Bilirubin: 2 mg/dL — ABNORMAL HIGH (ref 0.3–1.2)
Total Protein: 4.7 g/dL — ABNORMAL LOW (ref 6.5–8.1)

## 2020-04-21 LAB — SARS CORONAVIRUS 2 (TAT 6-24 HRS): SARS Coronavirus 2: NEGATIVE

## 2020-04-21 MED ORDER — POTASSIUM CHLORIDE 10 MEQ/100ML IV SOLN
10.0000 meq | INTRAVENOUS | Status: AC
  Administered 2020-04-21 (×4): 10 meq via INTRAVENOUS
  Filled 2020-04-21 (×4): qty 100

## 2020-04-21 MED ORDER — PROCHLORPERAZINE EDISYLATE 10 MG/2ML IJ SOLN
10.0000 mg | Freq: Four times a day (QID) | INTRAMUSCULAR | Status: DC | PRN
  Administered 2020-04-21 – 2020-04-24 (×4): 10 mg via INTRAVENOUS
  Filled 2020-04-21 (×4): qty 2

## 2020-04-21 MED ORDER — MIRTAZAPINE 15 MG PO TBDP
15.0000 mg | ORAL_TABLET | Freq: Every day | ORAL | Status: DC
  Administered 2020-04-21 – 2020-04-22 (×2): 15 mg via ORAL
  Filled 2020-04-21 (×2): qty 1

## 2020-04-21 MED ORDER — SODIUM CHLORIDE 0.9 % IV SOLN
2.0000 g | INTRAVENOUS | Status: DC
  Administered 2020-04-21 – 2020-04-23 (×3): 2 g via INTRAVENOUS
  Filled 2020-04-21 (×3): qty 2

## 2020-04-21 NOTE — Progress Notes (Addendum)
PROGRESS NOTE    Brooke [name]  OYD:[id number] DOB: [birth year] DOA: 04/20/2020 PCP: [name], MD    Chief Complaint  Patient presents with  . Abdominal Pain  . Diarrhea  . Emesis    Brief Narrative:  Pleasant 70 year old female history of widely metastatic stage IV breast cancer, anxiety, hypertension presented with intractable nausea vomiting, decreased appetite which have been ongoing for several months however worsened over the past 3 to 4 weeks after receiving chemotherapy.  Patient due to intractable nausea vomiting and abdominal pain was admitted.  Urinalysis done concerning for UTI.  Patient started on IV Rocephin.  Patient on IV fluids, IV Reglan.   Assessment & Plan:   Principal Problem:   Intractable nausea and vomiting Active Problems:   Acute lower UTI   Liver metastases (HCC)   Bone metastases (HCC)   Dehydration   Depression with anxiety   Transaminitis   1 intractable nausea vomiting Questionable etiology.  Acute abdominal series negative for any obstructive picture.  Urinalysis cloudy, large leukocytes, nitrite negative, many bacteria, WBC clumps present, > 50 WBC.  Patient with some improvement with nausea and emesis.  Patient still with a bout of emesis this morning.  Currently tolerating clears.  Continue IV fluids, IV Reglan, supportive care.  Placed on IV Compazine as needed.  Place empirically on IV Rocephin pending urine cultures.  Follow.  2.  Dehydration IV fluids.  3.  UTI Urinalysis cloudy, large leukocytes, nitrite negative, many bacteria, WBC clumps present, > 50 WBCs.  Patient with staghorn calculi noted on abdominal films.  Urine cultures from 02/19/2020 + for Proteus mirabilis.  Patient with stage IV breast cancer, immunocompromised.  Patient presented with intractable nausea and vomiting, abdominal pain.  Check urine cultures.  Placed empirically on IV Rocephin pending urine cultures.  Follow.  4.  Dyspnea?/Anxiety Likely  secondary to metastatic disease.  It is noted that patient has been trying to get O2 delivered at home.  Patient currently not hypoxic with sats of 92% on room air.  Acute abdominal series with no acute cardiopulmonary abnormalities noted.  Patient with recent CT angio chest on 02/27/2020 with findings concerning for metastatic disease including multiple punctuate bilateral pulmonary nodules, mediastinal right hilar adenopathy, suspected osseous and subcutaneous metastatic disease.  Continue hydration IV fluids.  Supportive care.  5.  Stage IV breast cancer Being followed by Dr. [name].  Oncology following.  Palliative care consultation pending.  6.  Transaminitis Likely secondary to metastatic disease.  Follow.  7.  Hypokalemia Replete.  8.  Goals of care Patient with stage IV metastatic breast cancer.  Patient presented with intractable nausea vomiting diffuse abdominal pain.  Patient being followed by oncology.  Palliative care consultation pending for goals of care.   DVT prophylaxis: SCDs Code Status: Full Family Communication: Updated patient.  No family at bedside Disposition:   Status is: Observation    Dispo: The patient is from: Home              Anticipated d/c is to: Home              Anticipated d/c date is: 2 to 3 days              Patient currently with nausea vomiting, dehydration, started on clears, on scheduled IV Reglan, urine cultures pending, on IV antibiotics.  Not stable for discharge.   Difficult to place patient no       Consultants:   Oncology: Dr. [name]  04/20/2020  Procedures:   Acute abdominal series 04/20/2020  Antimicrobials:  IV Rocephin 04/21/2020>>>>   Subjective: Patient with nausea.  Had a bout of emesis this morning however states overall symptomatically is getting better.  Currently tolerating clear liquids.  States has had soft stools.  Denies severe watery diarrhea.  No chest pain.  No shortness of breath.  Patient with some  complaints of diffuse abdominal pain.  Objective: Vitals:   04/20/20 1821 04/20/20 2249 04/21/20 0232 04/21/20 0625  BP: (!) 140/123 (!) 136/46 (!) 127/55 (!) 150/54  Pulse: (!) 103 (!) 103 (!) 101 95  Resp: 16 16 16 16   Temp: (!) 97.2 F (36.2 C) 99.1 F (37.3 C) 97.9 F (36.6 C) 98.3 F (36.8 C)  TempSrc: Oral Oral Oral Oral  SpO2: 99% 92% 92% 93%  Weight:      Height:        Intake/Output Summary (Last 24 hours) at 04/21/2020 1755 Last data filed at 04/21/2020 1400 Gross per 24 hour  Intake 1613.2 ml  Output 445 ml  Net 1168.2 ml   Filed Weights   04/20/20 1443  Weight: 83.5 kg    Examination:  General exam: Appears calm and comfortable  Respiratory system: Clear to auscultation. Respiratory effort normal. Cardiovascular system: S1 & S2 heard, RRR. No JVD, murmurs, rubs, gallops or clicks. No pedal edema. Gastrointestinal system: Abdomen is nondistended, soft and diffusely tender to palpation.  No organomegaly or masses felt. Normal bowel sounds heard. Central nervous system: Alert and oriented. No focal neurological deficits. Extremities: Symmetric 5 x 5 power. Skin: No rashes, lesions or ulcers Psychiatry: Judgement and insight appear normal. Mood & affect appropriate.     Data Reviewed: I have personally reviewed following labs and imaging studies  CBC: Recent Labs  Lab 04/20/20 1305 04/21/20 0451  WBC 7.3 6.2  NEUTROABS 5.5  --   HGB 10.6* 8.9*  HCT 32.5* 27.9*  MCV 86.7 90.0  PLT 83* 80*    Basic Metabolic Panel: Recent Labs  Lab 04/20/20 1305 04/20/20 1929 04/21/20 0451  NA 138  --  138  K 3.5  --  3.2*  CL 102  --  102  CO2 20*  --  21*  GLUCOSE 113*  --  106*  BUN 10  --  10  CREATININE 0.70  --  0.81  CALCIUM 7.9*  --  7.8*  MG  --  2.1  --     GFR: Estimated Creatinine Clearance: 75.6 mL/min (by C-G formula based on SCr of 0.81 mg/dL).  Liver Function Tests: Recent Labs  Lab 04/20/20 1305 04/21/20 0451  AST 95* 83*  ALT  37 32  ALKPHOS 251* 186*  BILITOT 1.7* 2.0*  PROT 5.5* 4.7*  ALBUMIN 3.0* 2.8*    CBG: No results for input(s): GLUCAP in the last 168 hours.   Recent Results (from the past 240 hour(s))  SARS CORONAVIRUS 2 (TAT 6-24 HRS) Nasopharyngeal Nasopharyngeal Swab     Status: None   Collection Time: 04/20/20  5:36 PM   Specimen: Nasopharyngeal Swab  Result Value Ref Range Status   SARS Coronavirus 2 NEGATIVE NEGATIVE Final    Comment: (NOTE) SARS-CoV-2 target nucleic acids are NOT DETECTED.  The SARS-CoV-2 RNA is generally detectable in upper and lower respiratory specimens during the acute phase of infection. Negative results do not preclude SARS-CoV-2 infection, do not rule out co-infections with other pathogens, and should not be used as the sole basis for treatment or other patient  management decisions. Negative results must be combined with clinical observations, patient history, and epidemiological information. The expected result is Negative.  Fact Sheet for Patients: SugarRoll.be  Fact Sheet for Healthcare Providers: https://www.woods-mathews.com/  This test is not yet approved or cleared by the [place] FDA and  has been authorized for detection and/or diagnosis of SARS-CoV-2 by FDA under an Emergency Use Authorization (EUA). This EUA will remain  in effect (meaning this test can be used) for the duration of the COVID-19 declaration under Se ction 564(b)(1) of the Act, 21 U.S.C. section 360bbb-3(b)(1), unless the authorization is terminated or revoked sooner.  Performed at [facility], [place] [address], [place], [place] [address]          Radiology Studies: DG Abdomen Acute W/Chest  Result Date: 04/20/2020 CLINICAL DATA:  Vomiting cancer EXAM: DG ABDOMEN ACUTE WITH 1 VIEW CHEST COMPARISON:  CT chest 02/27/2020, PET CT 03/05/2020 FINDINGS: Single view chest demonstrates right upper extremity central venous  catheter tip at the cavoatrial junction. Postsurgical changes of left chest and axilla. No focal opacity or pleural effusion. Normal cardiomediastinal silhouette. Supine and upright views of the abdomen demonstrate no free air beneath the diaphragm. Surgical clips in the right upper quadrant. Multiple round calcifications in the left upper quadrant consistent with splenic granuloma. Calcified left lung base granuloma. Nonobstructed gas pattern. Staghorn calculus right kidney. Known skeletal metastatic disease is better seen on PET CT. IMPRESSION: Nonobstructed gas pattern. Staghorn calculus right kidney. No radiographic evidence for acute cardiopulmonary abnormality. Electronically Signed   By: Donavan Foil M.D.   On: 04/20/2020 16:21        Scheduled Meds: . metoCLOPramide (REGLAN) injection  5 mg Intravenous Q8H  . mirtazapine  15 mg Oral QHS  . pantoprazole (PROTONIX) IV  40 mg Intravenous Q24H   Continuous Infusions: . cefTRIAXone (ROCEPHIN)  IV 2 g (04/21/20 1530)  . lactated ringers 125 mL/hr at 04/21/20 0858     LOS: 0 days    Time spent: 35 minutes    Irine Seal, MD [facility]   To contact the attending provider between 7A-7P or the covering provider during after hours 7P-7A, please log into the web site [email or web address] and access using universal [place] password for that web site. If you do not have the password, please call the hospital operator.  04/21/2020, 5:55 PM

## 2020-04-21 NOTE — Telephone Encounter (Signed)
Spoke with the pt and she stated she spoke with Dr [name] yesterday and received the message.

## 2020-04-22 ENCOUNTER — Inpatient Hospital Stay: Payer: Medicare PPO

## 2020-04-22 DIAGNOSIS — C7951 Secondary malignant neoplasm of bone: Secondary | ICD-10-CM | POA: Diagnosis present

## 2020-04-22 DIAGNOSIS — C7800 Secondary malignant neoplasm of unspecified lung: Secondary | ICD-10-CM | POA: Diagnosis present

## 2020-04-22 DIAGNOSIS — Z7189 Other specified counseling: Secondary | ICD-10-CM | POA: Diagnosis not present

## 2020-04-22 DIAGNOSIS — Z170 Estrogen receptor positive status [ER+]: Secondary | ICD-10-CM | POA: Diagnosis not present

## 2020-04-22 DIAGNOSIS — Z515 Encounter for palliative care: Secondary | ICD-10-CM | POA: Diagnosis not present

## 2020-04-22 DIAGNOSIS — C50812 Malignant neoplasm of overlapping sites of left female breast: Secondary | ICD-10-CM | POA: Diagnosis present

## 2020-04-22 DIAGNOSIS — B961 Klebsiella pneumoniae [K. pneumoniae] as the cause of diseases classified elsewhere: Secondary | ICD-10-CM | POA: Diagnosis present

## 2020-04-22 DIAGNOSIS — E785 Hyperlipidemia, unspecified: Secondary | ICD-10-CM | POA: Diagnosis present

## 2020-04-22 DIAGNOSIS — E872 Acidosis: Secondary | ICD-10-CM | POA: Diagnosis present

## 2020-04-22 DIAGNOSIS — Z20822 Contact with and (suspected) exposure to covid-19: Secondary | ICD-10-CM | POA: Diagnosis present

## 2020-04-22 DIAGNOSIS — K449 Diaphragmatic hernia without obstruction or gangrene: Secondary | ICD-10-CM | POA: Diagnosis present

## 2020-04-22 DIAGNOSIS — E876 Hypokalemia: Secondary | ICD-10-CM | POA: Diagnosis present

## 2020-04-22 DIAGNOSIS — C787 Secondary malignant neoplasm of liver and intrahepatic bile duct: Secondary | ICD-10-CM | POA: Diagnosis present

## 2020-04-22 DIAGNOSIS — E860 Dehydration: Secondary | ICD-10-CM | POA: Diagnosis present

## 2020-04-22 DIAGNOSIS — N200 Calculus of kidney: Secondary | ICD-10-CM | POA: Diagnosis present

## 2020-04-22 DIAGNOSIS — E669 Obesity, unspecified: Secondary | ICD-10-CM | POA: Diagnosis present

## 2020-04-22 DIAGNOSIS — Z85820 Personal history of malignant melanoma of skin: Secondary | ICD-10-CM | POA: Diagnosis not present

## 2020-04-22 DIAGNOSIS — R112 Nausea with vomiting, unspecified: Secondary | ICD-10-CM | POA: Diagnosis present

## 2020-04-22 DIAGNOSIS — Z66 Do not resuscitate: Secondary | ICD-10-CM | POA: Diagnosis present

## 2020-04-22 DIAGNOSIS — R7401 Elevation of levels of liver transaminase levels: Secondary | ICD-10-CM | POA: Diagnosis present

## 2020-04-22 DIAGNOSIS — F418 Other specified anxiety disorders: Secondary | ICD-10-CM | POA: Diagnosis present

## 2020-04-22 DIAGNOSIS — D6959 Other secondary thrombocytopenia: Secondary | ICD-10-CM | POA: Diagnosis present

## 2020-04-22 DIAGNOSIS — N3090 Cystitis, unspecified without hematuria: Secondary | ICD-10-CM | POA: Diagnosis present

## 2020-04-22 DIAGNOSIS — I10 Essential (primary) hypertension: Secondary | ICD-10-CM | POA: Diagnosis present

## 2020-04-22 DIAGNOSIS — Z923 Personal history of irradiation: Secondary | ICD-10-CM | POA: Diagnosis not present

## 2020-04-22 DIAGNOSIS — Z888 Allergy status to other drugs, medicaments and biological substances status: Secondary | ICD-10-CM | POA: Diagnosis not present

## 2020-04-22 DIAGNOSIS — D649 Anemia, unspecified: Secondary | ICD-10-CM | POA: Diagnosis present

## 2020-04-22 LAB — CBC WITH DIFFERENTIAL/PLATELET
Abs Immature Granulocytes: 0.34 10*3/uL — ABNORMAL HIGH (ref 0.00–0.07)
Basophils Absolute: 0.1 10*3/uL (ref 0.0–0.1)
Basophils Relative: 1 %
Eosinophils Absolute: 0 10*3/uL (ref 0.0–0.5)
Eosinophils Relative: 0 %
HCT: 29.8 % — ABNORMAL LOW (ref 36.0–46.0)
Hemoglobin: 9.3 g/dL — ABNORMAL LOW (ref 12.0–15.0)
Immature Granulocytes: 5 %
Lymphocytes Relative: 14 %
Lymphs Abs: 0.9 10*3/uL (ref 0.7–4.0)
MCH: 28.5 pg (ref 26.0–34.0)
MCHC: 31.2 g/dL (ref 30.0–36.0)
MCV: 91.4 fL (ref 80.0–100.0)
Monocytes Absolute: 0.5 10*3/uL (ref 0.1–1.0)
Monocytes Relative: 7 %
Neutro Abs: 4.8 10*3/uL (ref 1.7–7.7)
Neutrophils Relative %: 73 %
Platelets: 92 10*3/uL — ABNORMAL LOW (ref 150–400)
RBC: 3.26 MIL/uL — ABNORMAL LOW (ref 3.87–5.11)
RDW: 23.1 % — ABNORMAL HIGH (ref 11.5–15.5)
WBC: 6.6 10*3/uL (ref 4.0–10.5)
nRBC: 2.1 % — ABNORMAL HIGH (ref 0.0–0.2)

## 2020-04-22 LAB — RENAL FUNCTION PANEL
Albumin: 2.9 g/dL — ABNORMAL LOW (ref 3.5–5.0)
Anion gap: 12 (ref 5–15)
BUN: 9 mg/dL (ref 8–23)
CO2: 22 mmol/L (ref 22–32)
Calcium: 8 mg/dL — ABNORMAL LOW (ref 8.9–10.3)
Chloride: 101 mmol/L (ref 98–111)
Creatinine, Ser: 0.71 mg/dL (ref 0.44–1.00)
GFR, Estimated: >60 mL/min (ref >60)
Glucose, Bld: 97 mg/dL (ref 70–99)
Phosphorus: 2.4 mg/dL — ABNORMAL LOW (ref 2.5–4.6)
Potassium: 3.6 mmol/L (ref 3.5–5.1)
Sodium: 135 mmol/L (ref 135–145)

## 2020-04-22 LAB — MAGNESIUM: Magnesium: 2 mg/dL (ref 1.7–2.4)

## 2020-04-22 MED ORDER — PANTOPRAZOLE SODIUM 40 MG PO TBEC
40.0000 mg | DELAYED_RELEASE_TABLET | Freq: Every day | ORAL | Status: DC
  Administered 2020-04-23 – 2020-04-24 (×2): 40 mg via ORAL
  Filled 2020-04-22 (×3): qty 1

## 2020-04-22 MED ORDER — METOCLOPRAMIDE HCL 5 MG/ML IJ SOLN
10.0000 mg | Freq: Three times a day (TID) | INTRAMUSCULAR | Status: DC
  Administered 2020-04-22 – 2020-04-23 (×4): 10 mg via INTRAVENOUS
  Filled 2020-04-22 (×4): qty 2

## 2020-04-22 NOTE — Progress Notes (Signed)
PROGRESS NOTE    Brooke [name]  QZE:[id number] DOB: [birth year] DOA: 04/20/2020 PCP: [name], MD    Chief Complaint  Patient presents with  . Abdominal Pain  . Diarrhea  . Emesis    Brief Narrative:  30 fem  widely metastatic stage IV breast cancer, anxiety, hypertension presented with intractable nausea vomiting, decreased appetite which have been ongoing for several months however worsened over the past 3 to 4 weeks after receiving chemotherapy. Admit c intractable nausea vomiting and abdominal pain was admitted.   Urinalysis done concerning for UTI.  Patient started on IV Rocephin.  Patient on IV fluids, IV Reglan.   Assessment & Plan:   Principal Problem:   Intractable nausea and vomiting Active Problems:   Liver metastases (HCC)   Bone metastases (HCC)   Acute lower UTI   Dehydration   Depression with anxiety   Transaminitis   Metastatic malignant neoplasm (HCC)   Nausea vomiting and diarrhea   1 intractable nausea vomiting Patient describes nausea and a sticking sensation in her throat?  Globus from her anxiety problem We will obtain DG esophagus in a.m. and keep on soft diet until then Can continue Reglan 10 mg every 8 hourly, Protonix 40 daily Other explanations could be nausea vomiting from Klebsiella UTI  2.  Dehydration IV fluid--I have cut back her fluids from 125 to 75 cc/H given her underlying swelling  3.  Klebsiella cystitis from prior to admission without sepsis UTI Continue IV ceftriaxone await sensitivities  4.  Dyspnea?/Anxiety patient has been trying to get O2 delivered at home.  Patient currently not hypoxic with sats of 92% on room air.  Acute abdominal series with no acute cardiopulmonary abnormalities noted.   Recent CT ches December 2021 was also negative for PE, but did show spreading metastases  5.  Stage IV breast cancer Being followed by Dr. [name].  Oncology following.  Palliative care greatly appreciated.today   6.   Transaminitis Likely secondary to metastatic disease.  Follow.  7.  Hypokalemia Replete.  8.  Goals of care Patient with stage IV metastatic breast cancer.  Patient presented with intractable nausea vomiting diffuse abdominal pain.  Patient being followed by oncology.     DVT prophylaxis: SCDs Code Status: Full Family Communication: Updated patient.  No family at bedside Disposition:   Status is: Observation    Dispo: The patient is from: Home              Anticipated d/c is to: Home              Anticipated d/c date is: 2 to 3 days              Patient currently with nausea vomiting, dehydration, started on clears, on scheduled IV Reglan, urine cultures pending, on IV antibiotics.  Not stable for discharge.   Difficult to place patient no       Consultants:   Oncology: Dr. [name] 04/20/2020  Procedures:   Acute abdominal series 04/20/2020  Antimicrobials:  IV Rocephin 04/21/2020>>>>   Subjective: Some nausea able to eat a little bit of toast but not able to eat breads for the past several weeks Describes it as a sticking sensation  Objective: Vitals:   04/21/20 1812 04/21/20 2102 04/22/20 0553 04/22/20 1328  BP: (!) 162/69 (!) 156/70 (!) 144/72 (!) 146/59  Pulse: 100 96 89 94  Resp: 14 16 16 17   Temp: 98 F (36.7 C) 97.9 F (36.6 C) 98 F (  36.7 C) 97.6 F (36.4 C)  TempSrc:  Oral Oral Oral  SpO2: 94% 97% 92% 93%  Weight:      Height:        Intake/Output Summary (Last 24 hours) at 04/22/2020 1727 Last data filed at 04/22/2020 1400 Gross per 24 hour  Intake 3318.17 ml  Output 300 ml  Net 3018.17 ml   Filed Weights   04/20/20 1443  Weight: 83.5 kg    Examination:  Pleasant coherent no distress EOMI NCAT no focal deficit Abdomen soft no rebound no guarding No lower extremity edema grade 2-3 lower extremity edema 1-S2 no murmur no rub no gallop     Data Reviewed: I have personally reviewed following labs and imaging  studies  CBC: Recent Labs  Lab 04/20/20 1305 04/21/20 0451 04/22/20 0513  WBC 7.3 6.2 6.6  NEUTROABS 5.5  --  4.8  HGB 10.6* 8.9* 9.3*  HCT 32.5* 27.9* 29.8*  MCV 86.7 90.0 91.4  PLT 83* 80* 92*    Basic Metabolic Panel: Recent Labs  Lab 04/20/20 1305 04/20/20 1929 04/21/20 0451 04/22/20 0513  NA 138  --  138 135  K 3.5  --  3.2* 3.6  CL 102  --  102 101  CO2 20*  --  21* 22  GLUCOSE 113*  --  106* 97  BUN 10  --  10 9  CREATININE 0.70  --  0.81 0.71  CALCIUM 7.9*  --  7.8* 8.0*  MG  --  2.1  --  2.0  PHOS  --   --   --  2.4*    GFR: Estimated Creatinine Clearance: 76.6 mL/min (by C-G formula based on SCr of 0.71 mg/dL).  Liver Function Tests: Recent Labs  Lab 04/20/20 1305 04/21/20 0451 04/22/20 0513  AST 95* 83*  --   ALT 37 32  --   ALKPHOS 251* 186*  --   BILITOT 1.7* 2.0*  --   PROT 5.5* 4.7*  --   ALBUMIN 3.0* 2.8* 2.9*    CBG: No results for input(s): GLUCAP in the last 168 hours.   Recent Results (from the past 240 hour(s))  SARS CORONAVIRUS 2 (TAT 6-24 HRS) Nasopharyngeal Nasopharyngeal Swab     Status: None   Collection Time: 04/20/20  5:36 PM   Specimen: Nasopharyngeal Swab  Result Value Ref Range Status   SARS Coronavirus 2 NEGATIVE NEGATIVE Final    Comment: (NOTE) SARS-CoV-2 target nucleic acids are NOT DETECTED.  The SARS-CoV-2 RNA is generally detectable in upper and lower respiratory specimens during the acute phase of infection. Negative results do not preclude SARS-CoV-2 infection, do not rule out co-infections with other pathogens, and should not be used as the sole basis for treatment or other patient management decisions. Negative results must be combined with clinical observations, patient history, and epidemiological information. The expected result is Negative.  Fact Sheet for Patients: SugarRoll.be  Fact Sheet for Healthcare Providers: https://www.woods-mathews.com/  This  test is not yet approved or cleared by the [place] FDA and  has been authorized for detection and/or diagnosis of SARS-CoV-2 by FDA under an Emergency Use Authorization (EUA). This EUA will remain  in effect (meaning this test can be used) for the duration of the COVID-19 declaration under Se ction 564(b)(1) of the Act, 21 U.S.C. section 360bbb-3(b)(1), unless the authorization is terminated or revoked sooner.  Performed at [facility], [place] [address], [place], [place] [address]   Culture, Urine  Status: Abnormal (Preliminary result)   Collection Time: 04/21/20  9:19 AM   Specimen: Urine, Catheterized  Result Value Ref Range Status   Specimen Description   Final    URINE, CATHETERIZED Performed at [facility] [address], [place], [place] [address]    Special Requests   Final    NONE Performed at [facility], [place] [address] [place], [place] [address]    Culture (A)  Final    >=100,000 COLONIES/mL KLEBSIELLA PNEUMONIAE SUSCEPTIBILITIES TO FOLLOW Performed at [facility], [place] [address] [place], [place] [address]    Report Status PENDING  Incomplete         Radiology Studies: No results found.      Scheduled Meds: . metoCLOPramide (REGLAN) injection  10 mg Intravenous Q8H  . mirtazapine  15 mg Oral QHS  . pantoprazole (PROTONIX) IV  40 mg Intravenous Q24H   Continuous Infusions: . cefTRIAXone (ROCEPHIN)  IV 2 g (04/22/20 1133)  . lactated ringers 125 mL/hr at 04/22/20 1455     LOS: 0 days    Time spent: 25 minutes    Nita Sells, MD [facility]   To contact the attending provider between 7A-7P or the covering provider during after hours 7P-7A, please log into the web site [email or web address] and access using universal [place] password for that web site. If you do not have the password, please call the hospital operator.  04/22/2020, 5:27 PM

## 2020-04-22 NOTE — Consult Note (Signed)
Palliative care consult note  Reason for consult: Goals of care in light of widely metastatic breast cancer  Palliative care consult received.  Chart reviewed including personal review of pertinent labs and imaging.  Briefly, Brooke [name] is a 70 year old female with past medical history of widely metastatic stage IV breast cancer, anxiety, hypertension who was admitted with abdominal pain and intractable nausea and vomiting.  She has had decreased appetite ongoing for several months but is worsened over the past 3 or 4 weeks after receiving chemotherapy (Taxol).  She lives alone and does not have family support in the area.  She is very independent and desires to manage her own affairs as much as possible.  I spoke with Brooke [name] this evening.  I introduced palliative care as specialized medical care for people living with serious illness. It focuses on providing relief from the symptoms and stress of a serious illness. The goal is to improve quality of life for both the patient and the family.  We discussed clinical course as well as wishes moving forward in regard to advanced directives.  Values and goals of care important to patient and family were attempted to be elicited.  A large portion of her discussion revolved around her current symptoms of nausea.  She does report this is improved over the past 24 hours since being admitted to the hospital.  We talked about current regimen which includes Reglan, Compazine, and addition of mirtazapine in the evening.  She is hopeful this will help with her lack of appetite as well.  She tells me that she will try to eat something, but for she feels like it goes down and wants to come right back up.  She is not sure if this is improved since being on mirtazapine but does report that she has been thinking about wanting to possibly try eggs and toast tomorrow morning.  We discussed a brief overview of advance care planning and recommendation that we should  begin process of looking at her care plan moving forward in light of her incurable disease.  Discussed plan to see how she is feeling tomorrow at which point we may be able to further continue conversation regarding long-term goals of care.  Questions and concerns addressed.   PMT will continue to support holistically.   - Full code/Full scope treatment - Brooke [name] reports some improvement in her intractable nausea and vomiting.  Reviewed regimen with her including scheduled reglan, as needed compazine, and addition of mirtazapine, which is a new medication, this evening.  In talking with her, it appears she does have a component of early satiety that is contributing to her lack of appetite.  As she is feeling better and there have already been adjustments to her regimen today, discussed plan that I would recommend we not make any further changes and see how she is feeling tomorrow.  If she continues to feel worse, could consider increasing Reglan which may help with early satiety, or could also consider transition from mirtazapine to olanzapine to see if this is more beneficial to control her nausea. - We had initial discussion regarding recommendation for advance care planning to outline her wishes moving forward for her care.  She is open to discussion of this and I asked her to think about what is most important to her and who would be the important people to help her make decisions regarding her care moving forward.  Plan to further explore with her tomorrow.  Start time: 1800  End time: 1845 Total time: 45 minutes  Greater than 50%  of this time was spent counseling and coordinating care related to the above assessment and plan.  Micheline Rough, MD [facility] [phone]

## 2020-04-22 NOTE — Progress Notes (Signed)
Pt expressed to this RN that she did not want to be full code. Pt states "I want to live but I don't want tubes everywhere in me." RN encouraged pt to speak with the MD when they round in the morning. Dayshift RN will be notified as well.

## 2020-04-22 NOTE — Progress Notes (Signed)
Daily Progress Note   Patient Name: Brooke [name]       Date: 04/22/2020 DOB: 10-Oct-1950  Age: 70 y.o. MRN#: [id number] Attending Physician: Nita Sells, MD Primary Care Physician: [name], MD Admit Date: 04/20/2020  Reason for Consultation/Follow-up: Establishing goals of care and Non pain symptom management  Subjective: I met today with Brooke [name].  She reports that her nausea does seem to be better today when she was first admitted.  She tells me she thinks she may be able to tolerate small amount of soft food, such as scrambled egg and white toast.  She feels Reglan has been beneficial and states that she does still have some feelings of early satiety whenever she is taking liquids.  We discussed plan to increase Reglan to 10 mg 3 times daily to see if this improves early satiety and overall nausea.  We discussed advance care planning.  She is a very independent woman being able to be in her own home is very important to her.  We reviewed a MOST form and discussed how to develop plan of care to focus on continuing therapies that would maximize chance of being well enough to return home and limiting therapies not in line with this goal.  I discussed with Brooke [name] regarding heroic interventions at the end-of-life.  She reports that she would not want to be "hooked up to life support" and agrees that heroic interventions the event of cardiac or respiratory arrest are not likely to lead to getting well enough to go back home.  She is in agreement with changing CODE STATUS to DO NOT RESUSCITATE.  We completed MOST form today. DNR, Limited additional interventions, IVF and ABX if indicated, no feeding tube (although she may be open to consideration for feeding tube if  with daughter be beneficial to add time or quality to her life.  Discussed this is something he can be further discussed if the situation arises where it may be necessary to consider.)  We also discussed surrogate decision making. She reports that her sister, Brooke [name], would be her likely surrogate in the event she cannot make her medical decisions. She has another sister in [place] whom she trusts to make decisions as well, however, she feels that Brooke [name] would likely be able to better manage as she the  younger sister and lives closer to Brooke [name].  Length of Stay: 0  Current Medications: Scheduled Meds:  . metoCLOPramide (REGLAN) injection  10 mg Intravenous Q8H  . mirtazapine  15 mg Oral QHS  . pantoprazole (PROTONIX) IV  40 mg Intravenous Q24H    Continuous Infusions: . cefTRIAXone (ROCEPHIN)  IV 2 g (04/22/20 1133)  . lactated ringers 125 mL/hr at 04/21/20 0858    PRN Meds: acetaminophen **OR** acetaminophen, LORazepam, morphine injection, prochlorperazine  Physical Exam     General: Alert, awake, in no acute distress.  HEENT: No bruits, no goiter, no JVD Heart: Regular rate and rhythm. No murmur appreciated. Lungs: Good air movement Abdomen: Nondistended  Ext: + edema Skin: Warm and dry Neuro: Grossly intact, nonfocal.      Vital Signs: BP (!) 146/59 (BP Location: Left Arm)   Pulse 94   Temp 97.6 F (36.4 C) (Oral)   Resp 17   Ht _0  (1.753 m)   Wt 83.5 kg   LMP 03/18/1997   SpO2 93%   BMI 27.17 kg/m  SpO2: SpO2: 93 % O2 Device: O2 Device: Room Air O2 Flow Rate:    Intake/output summary:   Intake/Output Summary (Last 24 hours) at 04/22/2020 1400 Last data filed at 04/22/2020 1113 Gross per 24 hour  Intake 871.87 ml  Output 300 ml  Net 571.87 ml   LBM: Last BM Date: 04/21/20 Baseline Weight: Weight: 83.5 kg Most recent weight: Weight: 83.5 kg       Palliative Assessment/Data:    Flowsheet Rows   Flowsheet Row Most Recent Value  Intake Tab    Referral Department Hospitalist  Unit at Time of Referral Med/Surg Unit  Palliative Care Primary Diagnosis Cancer  Date Notified 04/20/20  Palliative Care Type New Palliative care  Reason for referral Clarify Goals of Care  Date of Admission 04/20/20  Date first seen by Palliative Care 04/21/20  # of days Palliative referral response time 1 Day(s)  # of days IP prior to Palliative referral 0  Clinical Assessment   Palliative Performance Scale Score 50%  Psychosocial & Spiritual Assessment   Palliative Care Outcomes   Patient/Family meeting held? Yes  Who was at the meeting? Patient      Patient Active Problem List   Diagnosis Date Noted  . Acute lower UTI 04/21/2020  . Dehydration 04/21/2020  . Depression with anxiety 04/21/2020  . Transaminitis 04/21/2020  . Metastatic malignant neoplasm ([place])   . Nausea vomiting and diarrhea   . Intractable nausea and vomiting 04/20/2020  . PICC (peripherally inserted central catheter) in place 04/17/2020  . Bone metastases ([place]) 03/11/2020  . Liver metastases ([place]) 03/05/2020  . Lung metastases ([place]) 03/05/2020  . Postoperative state 04/23/2019  . Superficial basal cell carcinoma (BCC) 02/10/2019  . Goals of care, counseling/discussion 12/14/2017  . Family history of uterine cancer   . Family history of prostate cancer   . Melanoma in situ of right upper extremity ([place]) 07/18/2017  . Port-A-Cath in place 04/18/2017  . Drug-induced neutropenia ([place]) 03/09/2017  . Family history of breast cancer 02/02/2017  . Family history of colon cancer 02/02/2017  . Family history of prostate cancer in father 02/02/2017  . Malignant neoplasm of overlapping sites of left breast in female, estrogen receptor positive ([place]) 01/31/2017  . Hx of hot flashes, menopausal, HRT in the past 12/19/2011  . Osteopenia, stable, DEXA 2013, recs to repeat in 2018 12/19/2011  . Hx of Hyperlipidemia, LDL 144 in 07/2011 12/19/2011  .  Vitamin D insufficiency  12/19/2011  . Fibroid     Palliative Care Assessment & Plan   Patient Profile: Brooke [name] is a 70 year old female with past medical history of widely metastatic stage IV breast cancer, anxiety, hypertension who was admitted with abdominal pain and intractable nausea and vomiting.  She has had decreased appetite ongoing for several months but is worsened over the past 3 or 4 weeks after receiving chemotherapy (Taxol).  She lives alone and does not have family support in the area.  She is very independent and desires to manage her own affairs as much as possible.  Recommendations/Plan:  DNR/DNI. MOST form completed today. She desires to continue with current interventions as well as treating treatable conditions, however, she would not want heroic interventions in the event of cardiac or respiratory arrest.  Nausea: Somewhat improved. Discussed plan to increase Reglan to 10 mg 3 times daily as she feels this has been beneficial. Additionally, will discuss with Dr. Verlon Au and progress diet to see if she is able to tolerate simple foods. This morning she is desiring eggs and white toast.  She reports that her sister, Brooke [name], would likely be best surrogate decision maker. Currently, from a legal standpoint, it would be her two sisters making decisions jointly and she is not opposed to this as she believes they both would work together in her best interest. At the same time, she may want to name 1 sister Brooke [name]) as she is younger and lives closer [employer]). I will place a consult to spiritual care to assist.  Plan to continue to follow.  Code Status:    Code Status Orders  (From admission, onward)         Start     Ordered   04/22/20 1030  Do not attempt resuscitation (DNR)  Continuous       Question Answer Comment  In the event of cardiac or respiratory ARREST Do not call a "code blue"   In the event of cardiac or respiratory ARREST Do not perform Intubation, CPR, defibrillation or  ACLS   In the event of cardiac or respiratory ARREST Use medication by any route, position, wound care, and other measures to relive pain and suffering. May use oxygen, suction and manual treatment of airway obstruction as needed for comfort.      04/22/20 1029        Code Status History    Date Active Date Inactive Code Status Order ID Comments User Context   04/20/2020 1807 04/22/2020 1029 Full Code [id number]  Jonnie Finner, DO ED   04/23/2019 1126 04/24/2019 1408 Full Code [id number]  [name], MD Inpatient   04/23/2019 0751 04/23/2019 1126 Full Code [id number]  [name], MD Inpatient   09/25/2017 1520 09/26/2017 1836 Full Code [id number]  Jovita Kussmaul, MD Inpatient   03/02/2017 1542 03/03/2017 1242 Full Code [id number]  Jovita Kussmaul, MD Inpatient   Advance Care Planning Activity       Prognosis:  Guarded  Discharge Planning:  To Be Determined  Care plan was discussed with patient  Thank you for allowing the Palliative Medicine Team to assist in the care of this patient.   Time In: 0930 Time Out: 1040 Total Time 70 Prolonged Time Billed Yes      Greater than 50%  of this time was spent counseling and coordinating care related to the above assessment and plan.  Micheline Rough, MD  Please contact Palliative Medicine Team phone at [phone] for questions  and concerns.

## 2020-04-23 ENCOUNTER — Inpatient Hospital Stay (HOSPITAL_COMMUNITY): Payer: Medicare PPO

## 2020-04-23 DIAGNOSIS — R112 Nausea with vomiting, unspecified: Secondary | ICD-10-CM | POA: Diagnosis not present

## 2020-04-23 DIAGNOSIS — C787 Secondary malignant neoplasm of liver and intrahepatic bile duct: Secondary | ICD-10-CM | POA: Diagnosis not present

## 2020-04-23 DIAGNOSIS — C50812 Malignant neoplasm of overlapping sites of left female breast: Secondary | ICD-10-CM | POA: Diagnosis not present

## 2020-04-23 DIAGNOSIS — Z515 Encounter for palliative care: Secondary | ICD-10-CM | POA: Diagnosis not present

## 2020-04-23 LAB — CBC WITH DIFFERENTIAL/PLATELET
Abs Immature Granulocytes: 0.3 10*3/uL — ABNORMAL HIGH (ref 0.00–0.07)
Basophils Absolute: 0 10*3/uL (ref 0.0–0.1)
Basophils Relative: 1 %
Eosinophils Absolute: 0 10*3/uL (ref 0.0–0.5)
Eosinophils Relative: 1 %
HCT: 28.2 % — ABNORMAL LOW (ref 36.0–46.0)
Hemoglobin: 9.2 g/dL — ABNORMAL LOW (ref 12.0–15.0)
Immature Granulocytes: 5 %
Lymphocytes Relative: 15 %
Lymphs Abs: 0.8 10*3/uL (ref 0.7–4.0)
MCH: 29 pg (ref 26.0–34.0)
MCHC: 32.6 g/dL (ref 30.0–36.0)
MCV: 89 fL (ref 80.0–100.0)
Monocytes Absolute: 0.4 10*3/uL (ref 0.1–1.0)
Monocytes Relative: 7 %
Neutro Abs: 4 10*3/uL (ref 1.7–7.7)
Neutrophils Relative %: 71 %
Platelets: 86 10*3/uL — ABNORMAL LOW (ref 150–400)
RBC: 3.17 MIL/uL — ABNORMAL LOW (ref 3.87–5.11)
RDW: 22.7 % — ABNORMAL HIGH (ref 11.5–15.5)
WBC: 5.6 10*3/uL (ref 4.0–10.5)
nRBC: 2 % — ABNORMAL HIGH (ref 0.0–0.2)

## 2020-04-23 LAB — URINE CULTURE: Culture: >=100000 — AB

## 2020-04-23 LAB — COMPREHENSIVE METABOLIC PANEL
ALT: 37 U/L (ref 0–44)
AST: 90 U/L — ABNORMAL HIGH (ref 15–41)
Albumin: 2.7 g/dL — ABNORMAL LOW (ref 3.5–5.0)
Alkaline Phosphatase: 195 U/L — ABNORMAL HIGH (ref 38–126)
Anion gap: 11 (ref 5–15)
BUN: 7 mg/dL — ABNORMAL LOW (ref 8–23)
CO2: 23 mmol/L (ref 22–32)
Calcium: 8 mg/dL — ABNORMAL LOW (ref 8.9–10.3)
Chloride: 102 mmol/L (ref 98–111)
Creatinine, Ser: 0.57 mg/dL (ref 0.44–1.00)
GFR, Estimated: >60 mL/min (ref >60)
Glucose, Bld: 102 mg/dL — ABNORMAL HIGH (ref 70–99)
Potassium: 3.2 mmol/L — ABNORMAL LOW (ref 3.5–5.1)
Sodium: 136 mmol/L (ref 135–145)
Total Bilirubin: 1.4 mg/dL — ABNORMAL HIGH (ref 0.3–1.2)
Total Protein: 5 g/dL — ABNORMAL LOW (ref 6.5–8.1)

## 2020-04-23 MED ORDER — KCL-LACTATED RINGERS-D5W 20 MEQ/L IV SOLN
INTRAVENOUS | Status: DC
  Filled 2020-04-23: qty 1000

## 2020-04-23 MED ORDER — CHLORHEXIDINE GLUCONATE CLOTH 2 % EX PADS
6.0000 | MEDICATED_PAD | Freq: Every day | CUTANEOUS | Status: DC
  Administered 2020-04-23 – 2020-04-24 (×2): 6 via TOPICAL

## 2020-04-23 MED ORDER — POTASSIUM CHLORIDE 2 MEQ/ML IV SOLN
INTRAVENOUS | Status: DC
  Filled 2020-04-23: qty 1000

## 2020-04-23 MED ORDER — ENSURE ENLIVE PO LIQD
237.0000 mL | Freq: Two times a day (BID) | ORAL | Status: DC
  Administered 2020-04-24 (×2): 237 mL via ORAL

## 2020-04-23 MED ORDER — METOCLOPRAMIDE HCL 10 MG PO TABS: 10.0000 mg | ORAL_TABLET | Freq: Three times a day (TID) | ORAL | Status: DC | PRN

## 2020-04-23 MED ORDER — CEPHALEXIN 500 MG PO CAPS
500.0000 mg | ORAL_CAPSULE | Freq: Two times a day (BID) | ORAL | Status: DC
  Administered 2020-04-24: 500 mg via ORAL
  Filled 2020-04-23: qty 1

## 2020-04-23 MED ORDER — POTASSIUM CHLORIDE 20 MEQ PO PACK
40.0000 meq | PACK | Freq: Every day | ORAL | Status: DC
  Administered 2020-04-23: 40 meq via ORAL
  Filled 2020-04-23 (×2): qty 2

## 2020-04-23 MED ORDER — MIRTAZAPINE 15 MG PO TABS
15.0000 mg | ORAL_TABLET | Freq: Every day | ORAL | Status: DC
  Administered 2020-04-23: 15 mg via ORAL
  Filled 2020-04-23: qty 1

## 2020-04-23 NOTE — Progress Notes (Signed)
Initial Nutrition Assessment  DOCUMENTATION CODES:   Non-severe (moderate) malnutrition in context of chronic illness  INTERVENTION:   -Ensure Enlive po BID, each supplement provides 350 kcal and 20 grams of protein  If PO does not improve, recommend PEG tube if still within [place].  NUTRITION DIAGNOSIS:   Moderate Malnutrition related to cancer and cancer related treatments,chronic illness as evidenced by percent weight loss,energy intake < or equal to 75% for > or equal to 1 month.  GOAL:   Patient will meet greater than or equal to 90% of their needs  MONITOR:   PO intake,Supplement acceptance,Labs,Weight trends,I & O's  REASON FOR ASSESSMENT:   Malnutrition Screening Tool    ASSESSMENT:   70 y.o. patient with widely metastatic stage IV breast cancer, anxiety, hypertension presented with intractable nausea vomiting, decreased appetite which have been ongoing for several months however worsened over the past 3 to 4 weeks after receiving chemotherapy.  Admit c intractable nausea vomiting and abdominal pain was admitted.  Patient with continued poor PO intakes given dysphagia and sensations when swallowing. Esophagram this AM was negative for stricture or mass in esophagus. Pt has also reported diarrhea and nausea. If unable to improve PO intakes, will need feeding tube for enteral nutrition. Pt has had difficulty eating for months now.   Yesterday pt consumed 20% of scrambled eggs and toast. Consumed 90% of lunch today of broth and mashed potatoes. Will order Ensure supplements to see if these are tolerated by patient.   Per palliative care note 2/23, pt would consider feeding tube if it would add time or quality of life.  Per weight records, pt has lost 39 lbs since November 2021 (17% wt loss x 3 months, significant for time frame).   Medications: Reglan, Remeron, D5 infusion, Compazine  Labs reviewed: Low K, Phos  NUTRITION - FOCUSED PHYSICAL EXAM:  Unable to  complete, will attempt at follow-up  Diet Order:   Diet Order            Diet regular Room service appropriate? Yes; Fluid consistency: Thin  Diet effective now                 EDUCATION NEEDS:   No education needs have been identified at this time  Skin:  Skin Assessment: Reviewed RN Assessment  Last BM:  2/22  Height:   Ht Readings from Last 1 Encounters:  04/20/20 5\' 9"  (1.753 m)    Weight:   Wt Readings from Last 1 Encounters:  04/20/20 83.5 kg   BMI:  Body mass index is 27.17 kg/m.  Estimated Nutritional Needs:   Kcal:  2000-2200  Protein:  110-120g  Fluid:  2L/day   Clayton Bibles, MS, RD, LDN Inpatient Clinical Dietitian Contact information available via Amion

## 2020-04-23 NOTE — Progress Notes (Signed)
PROGRESS NOTE    GRACELEE STEMMLER  QPR:[id number] DOB: 10/03/1950 DOA: 04/20/2020 PCP: [name], MD    Chief Complaint  Patient presents with  . Abdominal Pain  . Diarrhea  . Emesis    Brief Narrative:  34 fem  widely metastatic stage IV breast cancer, anxiety, hypertension presented with intractable nausea vomiting, decreased appetite which have been ongoing for several months however worsened over the past 3 to 4 weeks after receiving chemotherapy. Admit c intractable nausea vomiting and abdominal pain was admitted.   Urinalysis done concerning for UTI.  Patient started on IV Rocephin.  Patient on IV fluids, IV Reglan.   Assessment & Plan:   Principal Problem:   Intractable nausea and vomiting Active Problems:   Liver metastases (HCC)   Bone metastases (HCC)   Acute lower UTI   Dehydration   Depression with anxiety   Transaminitis   Metastatic malignant neoplasm (HCC)   Nausea vomiting and diarrhea   1 intractable nausea vomiting  Patient describes nausea and a sticking sensation in her throat?  Globus sensation Barium swallow 2/24 shows no organic pathology except small hiatal hernia Transition to po Reglan , Protonix 40 daily If tolerates multiple BMs likely can discharge in a.m.  2.  Dehydration IV fluid--nsl locked today  3.  Klebsiella cystitis from prior to admission without sepsis UTI  IV ceftriaxone transitioned to keflex to complete 5 days total  4.  Dyspnea?/Anxiety patient has been trying to get O2 delivered at home.  Patient currently not hypoxic with sats of 92% on room air.  Acute abdominal series with no acute cardiopulmonary abnormalities noted.     5.  Stage IV breast cancer Being followed by Dr. [name].  Oncology following.  Palliative care greatly appreciated. Recent CT chest December 2021 was also negative for PE, but did show spreading metastases Can d/c decadron per Oncologist  6.  Transaminitis Likely secondary to  metastatic disease.  Follow.  7.  Hypokalemia Replete with po today as eating and drinking a little    DVT prophylaxis: SCDs Code Status: DNAR Family Communication: Updated patient.  No family at bedside-she lives alone Disposition:   Status is: Inpatient    Dispo: The patient is from: Home              Anticipated d/c is to: Home              Anticipated d/c date is: 04/23/2020              Patient currently with nausea vomiting, dehydration, started on clears, on scheduled IV Reglan, urine cultures pending, on IV antibiotics.  Not stable for discharge.   Difficult to place patient no       Consultants:   Oncology: Dr. [name] 04/20/2020  Procedures:   Acute abdominal series 04/20/2020  Antimicrobials:  IV Rocephin 04/21/2020>>>>   Subjective: Vomit x 1 today but otherwsie well --think that she was given the barium too quickly No cp no f No chill no diarr no cough  Objective: Vitals:   04/22/20 1328 04/22/20 2100 04/23/20 0626 04/23/20 0700  BP: (!) 146/59 (!) 149/68 (!) 152/65   Pulse: 94 (!) 101 87   Resp: 17 16 16    Temp: 97.6 F (36.4 C) 98.4 F (36.9 C) 97.6 F (36.4 C)   TempSrc: Oral Oral Oral   SpO2: 93% (!) 89% (!) 86% 91%  Weight:      Height:       No  intake or output data in the 24 hours ending 04/23/20 1430 Filed Weights   04/20/20 1443  Weight: 83.5 kg    Examination:  Coherent awake alert in nad no focal deficit cta b no added sound abd soft nt nd  Neuro intact no focal deficit Psych euthymic   Data Reviewed: I have personally reviewed following labs and imaging studies  CBC: Recent Labs  Lab 04/20/20 1305 04/21/20 0451 04/22/20 0513 04/23/20 0446  WBC 7.3 6.2 6.6 5.6  NEUTROABS 5.5  --  4.8 4.0  HGB 10.6* 8.9* 9.3* 9.2*  HCT 32.5* 27.9* 29.8* 28.2*  MCV 86.7 90.0 91.4 89.0  PLT 83* 80* 92* 86*    Basic Metabolic Panel: Recent Labs  Lab 04/20/20 1305 04/20/20 1929 04/21/20 0451 04/22/20 0513 04/23/20 0446   NA 138  --  138 135 136  K 3.5  --  3.2* 3.6 3.2*  CL 102  --  102 101 102  CO2 20*  --  21* 22 23  GLUCOSE 113*  --  106* 97 102*  BUN 10  --  10 9 7*  CREATININE 0.70  --  0.81 0.71 0.57  CALCIUM 7.9*  --  7.8* 8.0* 8.0*  MG  --  2.1  --  2.0  --   PHOS  --   --   --  2.4*  --     GFR: Estimated Creatinine Clearance: 76.6 mL/min (by C-G formula based on SCr of 0.57 mg/dL).  Liver Function Tests: Recent Labs  Lab 04/20/20 1305 04/21/20 0451 04/22/20 0513 04/23/20 0446  AST 95* 83*  --  90*  ALT 37 32  --  37  ALKPHOS 251* 186*  --  195*  BILITOT 1.7* 2.0*  --  1.4*  PROT 5.5* 4.7*  --  5.0*  ALBUMIN 3.0* 2.8* 2.9* 2.7*    CBG: No results for input(s): GLUCAP in the last 168 hours.   Recent Results (from the past 240 hour(s))  SARS CORONAVIRUS 2 (TAT 6-24 HRS) Nasopharyngeal Nasopharyngeal Swab     Status: None   Collection Time: 04/20/20  5:36 PM   Specimen: Nasopharyngeal Swab  Result Value Ref Range Status   SARS Coronavirus 2 NEGATIVE NEGATIVE Final    Comment: (NOTE) SARS-CoV-2 target nucleic acids are NOT DETECTED.  The SARS-CoV-2 RNA is generally detectable in upper and lower respiratory specimens during the acute phase of infection. Negative results do not preclude SARS-CoV-2 infection, do not rule out co-infections with other pathogens, and should not be used as the sole basis for treatment or other patient management decisions. Negative results must be combined with clinical observations, patient history, and epidemiological information. The expected result is Negative.  Fact Sheet for Patients: SugarRoll.be  Fact Sheet for Healthcare Providers: https://www.woods-mathews.com/  This test is not yet approved or cleared by the [place] FDA and  has been authorized for detection and/or diagnosis of SARS-CoV-2 by FDA under an Emergency Use Authorization (EUA). This EUA will remain  in effect (meaning this  test can be used) for the duration of the COVID-19 declaration under Se ction 564(b)(1) of the Act, 21 U.S.C. section 360bbb-3(b)(1), unless the authorization is terminated or revoked sooner.  Performed at [facility], [place] [address], [place], [place] [address]   Culture, Urine     Status: Abnormal   Collection Time: 04/21/20  9:19 AM   Specimen: Urine, Catheterized  Result Value Ref Range Status   Specimen Description   Final  URINE, CATHETERIZED Performed at [facility], [place] [address] [place], [place] [address]    Special Requests   Final    NONE Performed at [facility], [place] [address] [place], [place] [address]    Culture >=100,000 COLONIES/mL KLEBSIELLA PNEUMONIAE (A)  Final   Report Status 04/23/2020 FINAL  Final   Organism ID, Bacteria KLEBSIELLA PNEUMONIAE (A)  Final      Susceptibility   Klebsiella pneumoniae - MIC*    AMPICILLIN >=32 RESISTANT Resistant     CEFAZOLIN <=4 SENSITIVE Sensitive     CEFEPIME <=0.12 SENSITIVE Sensitive     CEFTRIAXONE <=0.25 SENSITIVE Sensitive     CIPROFLOXACIN <=0.25 SENSITIVE Sensitive     GENTAMICIN <=1 SENSITIVE Sensitive     IMIPENEM <=0.25 SENSITIVE Sensitive     NITROFURANTOIN 64 INTERMEDIATE Intermediate     TRIMETH/SULFA <=20 SENSITIVE Sensitive     AMPICILLIN/SULBACTAM 4 SENSITIVE Sensitive     PIP/TAZO <=4 SENSITIVE Sensitive     * >=100,000 COLONIES/mL KLEBSIELLA PNEUMONIAE         Radiology Studies: DG ESOPHAGUS W SINGLE CM (SOL OR THIN BA)  Result Date: 04/23/2020 CLINICAL DATA:  Dysphagia. EXAM: ESOPHOGRAM/BARIUM SWALLOW TECHNIQUE: Single contrast examination was performed using  thin barium. FLUOROSCOPY TIME:  Radiation Exposure Index (if provided by the fluoroscopic device): 17.9 mGy. COMPARISON:  None. FINDINGS: Exam is limited as the patient only drank a small amount of contrast. No definite mass or stricture is noted in the esophagus small sliding-type hiatal  hernia is noted. No definite reflux is noted. Barium tablet could not be administered as the patient could not be placed in upright position. IMPRESSION: Small sliding-type hiatal hernia. No definite mass or stricture is noted in the esophagus. Electronically Signed   By: Marijo Conception M.D.   On: 04/23/2020 09:15        Scheduled Meds: . Chlorhexidine Gluconate Cloth  6 each Topical Daily  . metoCLOPramide (REGLAN) injection  10 mg Intravenous Q8H  . mirtazapine  15 mg Oral QHS  . pantoprazole  40 mg Oral Daily   Continuous Infusions: . cefTRIAXone (ROCEPHIN)  IV 2 g (04/23/20 0927)  . dextrose 5% lactated ringers with KCl 20 mEq/L 100 mL/hr at 04/23/20 0925     LOS: 1 day    Time spent: 25 minutes    Nita Sells, MD [facility]   To contact the attending provider between 7A-7P or the covering provider during after hours 7P-7A, please log into the web site [email or web address] and access using universal [place] password for that web site. If you do not have the password, please call the hospital operator.  04/23/2020, 2:30 PM

## 2020-04-23 NOTE — Progress Notes (Signed)
Chaplain engaged in initial visit with [facility].  Chaplain provided Scientist, physiological (Healthcare POA) paperwork to Brooke [name] and offered education on how to complete it.  Brooke [name] voiced needing some time to do that today.  Chaplain let Brooke [name] know how to reach her whenever she is ready to finish it.  Chaplain also explained how to complete AD outside of the hospital if she is discharged anytime soon.  Chaplain is available to follow-up.    04/23/20 1200  Clinical Encounter Type  Visited With Patient  Visit Type Initial

## 2020-04-23 NOTE — Progress Notes (Signed)
Daily Progress Note   Patient Name: Brooke [name]       Date: 04/23/2020 DOB: Apr 25, 1950  Age: 70 y.o. MRN#: [id number] Attending Physician: Nita Sells, MD Primary Care Physician: [name], MD Admit Date: 04/20/2020  Reason for Consultation/Follow-up: Establishing goals of care and Non pain symptom management  Subjective: I saw and examined Ms. Cheese today.  She reports that her nausea is a little better today.  She feels a little hungry and has ordered and is waiting for lunch.   In discussion of her regimen, her only concern is that she was not able to tolerate "the dissolvable" pill.  MAR review shows this is likely her Remeron.  She requested I change this to pill form.  Length of Stay: 1  Current Medications: Scheduled Meds:  . [START ON 04/24/2020] cephALEXin  500 mg Oral Q12H  . Chlorhexidine Gluconate Cloth  6 each Topical Daily  . [START ON 04/24/2020] feeding supplement  237 mL Oral BID BM  . mirtazapine  15 mg Oral QHS  . pantoprazole  40 mg Oral Daily  . potassium chloride  40 mEq Oral Daily    Continuous Infusions:   PRN Meds: acetaminophen **OR** acetaminophen, LORazepam, metoCLOPramide, morphine injection, prochlorperazine  Physical Exam     General: Alert, awake, in no acute distress.  HEENT: No bruits, no goiter, no JVD Heart: Regular rate and rhythm. No murmur appreciated. Lungs: Good air movement Abdomen: Nondistended  Ext: + edema Skin: Warm and dry Neuro: Grossly intact, nonfocal.      Vital Signs: BP (!) 152/69 (BP Location: Right Arm)   Pulse 94   Temp 98 F (36.7 C)   Resp 16   Ht 5\' 9"  (1.753 m)   Wt 83.5 kg   LMP 03/18/1997   SpO2 91%   BMI 27.17 kg/m  SpO2: SpO2: 91 % O2 Device: O2 Device: Room Air O2 Flow  Rate: O2 Flow Rate (L/min): 2 L/min  Intake/output summary:   Intake/Output Summary (Last 24 hours) at 04/23/2020 2133 Last data filed at 04/23/2020 1800 Gross per 24 hour  Intake 1486.56 ml  Output 100 ml  Net 1386.56 ml   LBM: Last BM Date: 04/23/20 Baseline Weight: Weight: 83.5 kg Most recent weight: Weight: 83.5 kg       Palliative Assessment/Data:  Flowsheet Rows   Flowsheet Row Most Recent Value  Intake Tab   Referral Department Hospitalist  Unit at Time of Referral Med/Surg Unit  Palliative Care Primary Diagnosis Cancer  Date Notified 04/20/20  Palliative Care Type New Palliative care  Reason for referral Clarify Goals of Care  Date of Admission 04/20/20  Date first seen by Palliative Care 04/21/20  # of days Palliative referral response time 1 Day(s)  # of days IP prior to Palliative referral 0  Clinical Assessment   Palliative Performance Scale Score 50%  Psychosocial & Spiritual Assessment   Palliative Care Outcomes   Patient/Family meeting held? Yes  Who was at the meeting? Patient      Patient Active Problem List   Diagnosis Date Noted  . Acute lower UTI 04/21/2020  . Dehydration 04/21/2020  . Depression with anxiety 04/21/2020  . Transaminitis 04/21/2020  . Metastatic malignant neoplasm ([place])   . Nausea vomiting and diarrhea   . Intractable nausea and vomiting 04/20/2020  . PICC (peripherally inserted central catheter) in place 04/17/2020  . Bone metastases ([place]) 03/11/2020  . Liver metastases ([place]) 03/05/2020  . Lung metastases ([place]) 03/05/2020  . Postoperative state 04/23/2019  . Superficial basal cell carcinoma (BCC) 02/10/2019  . Goals of care, counseling/discussion 12/14/2017  . Family history of uterine cancer   . Family history of prostate cancer   . Melanoma in situ of right upper extremity ([place]) 07/18/2017  . Port-A-Cath in place 04/18/2017  . Drug-induced neutropenia ([place]) 03/09/2017  . Family history of breast cancer 02/02/2017   . Family history of colon cancer 02/02/2017  . Family history of prostate cancer in father 02/02/2017  . Malignant neoplasm of overlapping sites of left breast in female, estrogen receptor positive ([place]) 01/31/2017  . Hx of hot flashes, menopausal, HRT in the past 12/19/2011  . Osteopenia, stable, DEXA 2013, recs to repeat in 2018 12/19/2011  . Hx of Hyperlipidemia, LDL 144 in 07/2011 12/19/2011  . Vitamin D insufficiency 12/19/2011  . Fibroid     Palliative Care Assessment & Plan   Patient Profile: [name] is a 70 year old female with past medical history of widely metastatic stage IV breast cancer, anxiety, hypertension who was admitted with abdominal pain and intractable nausea and vomiting.  She has had decreased appetite ongoing for several months but is worsened over the past 3 or 4 weeks after receiving chemotherapy (Taxol).  She lives alone and does not have family support in the area.  She is very independent and desires to manage her own affairs as much as possible.  Recommendations/Plan: DNR/DNI. She desires to continue with current interventions as well as treating treatable conditions, however, she would not want heroic interventions in the event of cardiac or respiratory arrest. Nausea: Improved per patient report.  She asked I change remeron to pill form. She reports that her sister, [name], would likely be best surrogate decision maker. Currently, from a legal standpoint, it would be her two sisters making decisions jointly and she is not opposed to this as she believes they both would work together in her best interest. At the same time, she may want to name 1 sister [name]) as she is younger and lives closer [employer]). I placed a consult to spiritual care to assist.  Code Status:    Code Status Orders  (From admission, onward)         Start     Ordered   04/22/20 1030  Do not attempt resuscitation (DNR)  Continuous  Question Answer Comment  In the event  of cardiac or respiratory ARREST Do not call a "code blue"   In the event of cardiac or respiratory ARREST Do not perform Intubation, CPR, defibrillation or ACLS   In the event of cardiac or respiratory ARREST Use medication by any route, position, wound care, and other measures to relive pain and suffering. May use oxygen, suction and manual treatment of airway obstruction as needed for comfort.      04/22/20 1029        Code Status History    Date Active Date Inactive Code Status Order ID Comments User Context   04/20/2020 1807 04/22/2020 1029 Full Code [id number]  Jonnie Finner, DO ED   04/23/2019 1126 04/24/2019 1408 Full Code [id number]  [name], MD Inpatient   04/23/2019 0751 04/23/2019 1126 Full Code [id number]  [name], MD Inpatient   09/25/2017 1520 09/26/2017 1836 Full Code [id number]  Jovita Kussmaul, MD Inpatient   03/02/2017 1542 03/03/2017 1242 Full Code [id number]  Jovita Kussmaul, MD Inpatient   Advance Care Planning Activity      Prognosis: Guarded  Discharge Planning: Home with [place] was discussed with patient  Thank you for allowing the Palliative Medicine Team to assist in the care of this patient.   Time In: 1200 Time Out: 1225 Total Time 25 Prolonged Time Billed No   Greater than 50%  of this time was spent counseling and coordinating care related to the above assessment and plan.  Micheline Rough, MD  Please contact Palliative Medicine Team phone at [phone] for questions and concerns.

## 2020-04-24 ENCOUNTER — Inpatient Hospital Stay: Payer: Medicare PPO

## 2020-04-24 DIAGNOSIS — R112 Nausea with vomiting, unspecified: Secondary | ICD-10-CM | POA: Diagnosis not present

## 2020-04-24 LAB — CBC WITH DIFFERENTIAL/PLATELET
Abs Immature Granulocytes: 0.25 10*3/uL — ABNORMAL HIGH (ref 0.00–0.07)
Basophils Absolute: 0 10*3/uL (ref 0.0–0.1)
Basophils Relative: 1 %
Eosinophils Absolute: 0 10*3/uL (ref 0.0–0.5)
Eosinophils Relative: 1 %
HCT: 30.1 % — ABNORMAL LOW (ref 36.0–46.0)
Hemoglobin: 9.4 g/dL — ABNORMAL LOW (ref 12.0–15.0)
Immature Granulocytes: 5 %
Lymphocytes Relative: 12 %
Lymphs Abs: 0.6 10*3/uL — ABNORMAL LOW (ref 0.7–4.0)
MCH: 28.5 pg (ref 26.0–34.0)
MCHC: 31.2 g/dL (ref 30.0–36.0)
MCV: 91.2 fL (ref 80.0–100.0)
Monocytes Absolute: 0.4 10*3/uL (ref 0.1–1.0)
Monocytes Relative: 8 %
Neutro Abs: 3.5 10*3/uL (ref 1.7–7.7)
Neutrophils Relative %: 73 %
Platelets: 89 10*3/uL — ABNORMAL LOW (ref 150–400)
RBC: 3.3 MIL/uL — ABNORMAL LOW (ref 3.87–5.11)
RDW: 23.4 % — ABNORMAL HIGH (ref 11.5–15.5)
WBC: 4.9 10*3/uL (ref 4.0–10.5)
nRBC: 3.3 % — ABNORMAL HIGH (ref 0.0–0.2)

## 2020-04-24 LAB — COMPREHENSIVE METABOLIC PANEL
ALT: 37 U/L (ref 0–44)
AST: 93 U/L — ABNORMAL HIGH (ref 15–41)
Albumin: 2.8 g/dL — ABNORMAL LOW (ref 3.5–5.0)
Alkaline Phosphatase: 205 U/L — ABNORMAL HIGH (ref 38–126)
Anion gap: 14 (ref 5–15)
BUN: 5 mg/dL — ABNORMAL LOW (ref 8–23)
CO2: 22 mmol/L (ref 22–32)
Calcium: 8 mg/dL — ABNORMAL LOW (ref 8.9–10.3)
Chloride: 102 mmol/L (ref 98–111)
Creatinine, Ser: 0.54 mg/dL (ref 0.44–1.00)
GFR, Estimated: >60 mL/min (ref >60)
Glucose, Bld: 128 mg/dL — ABNORMAL HIGH (ref 70–99)
Potassium: 3.4 mmol/L — ABNORMAL LOW (ref 3.5–5.1)
Sodium: 138 mmol/L (ref 135–145)
Total Bilirubin: 1.4 mg/dL — ABNORMAL HIGH (ref 0.3–1.2)
Total Protein: 5 g/dL — ABNORMAL LOW (ref 6.5–8.1)

## 2020-04-24 MED ORDER — POTASSIUM CHLORIDE CRYS ER 20 MEQ PO TBCR: 40.0000 meq | EXTENDED_RELEASE_TABLET | Freq: Every day | ORAL | Status: DC

## 2020-04-24 MED ORDER — METOCLOPRAMIDE HCL 10 MG PO TABS: 10.0000 mg | ORAL_TABLET | Freq: Three times a day (TID) | ORAL | 0 refills | Status: AC | PRN

## 2020-04-24 MED ORDER — POTASSIUM CHLORIDE CRYS ER 10 MEQ PO TBCR
40.0000 meq | EXTENDED_RELEASE_TABLET | Freq: Every day | ORAL | 0 refills | Status: DC
Start: 2020-04-25 — End: 2020-05-12

## 2020-04-24 MED ORDER — CEPHALEXIN 500 MG PO CAPS: 500.0000 mg | ORAL_CAPSULE | Freq: Two times a day (BID) | ORAL | 0 refills | Status: AC

## 2020-04-24 MED ORDER — PANTOPRAZOLE SODIUM 40 MG PO TBEC: 40.0000 mg | DELAYED_RELEASE_TABLET | Freq: Every day | ORAL | 0 refills | Status: DC

## 2020-04-24 MED ORDER — LOSARTAN POTASSIUM 25 MG PO TABS: 12.5000 mg | ORAL_TABLET | Freq: Every day | ORAL | Status: DC

## 2020-04-24 MED ORDER — HEPARIN SOD (PORK) LOCK FLUSH 100 UNIT/ML IV SOLN
250.0000 [IU] | INTRAVENOUS | Status: AC | PRN
  Administered 2020-04-24: 250 [IU]
  Filled 2020-04-24: qty 2.5

## 2020-04-24 MED ORDER — POTASSIUM CHLORIDE CRYS ER 10 MEQ PO TBCR
40.0000 meq | EXTENDED_RELEASE_TABLET | Freq: Every day | ORAL | Status: DC
  Administered 2020-04-24: 40 meq via ORAL
  Filled 2020-04-24: qty 4

## 2020-04-24 MED ORDER — POTASSIUM CHLORIDE 20 MEQ PO PACK
40.0000 meq | PACK | Freq: Every day | ORAL | Status: DC
  Filled 2020-04-24: qty 2

## 2020-04-24 NOTE — Progress Notes (Signed)
Discharge instructions given to patient and all questions were answered.  

## 2020-04-24 NOTE — Discharge Summary (Signed)
Physician Discharge Summary  Brooke [name] XNA:[id number] DOB: Jun 11, 1950 DOA: 04/20/2020  PCP: [name], MD  Admit date: 04/20/2020 Discharge date: 04/24/2020  Time spent: 30 minutes  Recommendations for Outpatient Follow-up:  1. Complete course of Keflex for Klebsiella cystitis 2. Needs Chem-7 in about 1 week 3. New meds p.o. Reglan, Protonix  Discharge Diagnoses:  MAIN problem for hospitalization   Klebsiella cystitis without sepsis Volume depletion on admission Hypokalemia  Please see below for itemized issues addressed in [place]- refer to other progress notes for clarity if needed  Discharge Condition: Improved  Diet recommendation: Soft  Filed Weights   04/20/20 1443  Weight: 83.5 kg    History of present illness:   1 intractable nausea vomiting  Patient describes nausea and a sticking sensation in her throat?  Globus sensation Barium swallow 2/24 shows no organic pathology except small hiatal hernia Transition to po Reglan , Protonix 40 daily which she has been able to tolerate She requests to go home today although did not really want to eat anything earlier in the day-I have explained to her to try to self manage with a soft diet  hopefully this will resolve with the Reglan and Protonix in the outpatient setting   2.  Dehydration IV was repleted with IV fluids and has improved  3.  Klebsiella cystitis from prior to admission without sepsis UTI  IV ceftriaxone transitioned to keflex to complete 5 days total  4.  Dyspnea?/Anxiety patient has been trying to get O2 delivered at home.  Patient currently not hypoxic with sats of 92% on room air.  Acute abdominal series with no acute cardiopulmonary abnormalities noted.     5.  Stage IV breast cancer Being followed by Dr. [name].  Oncology following.  Palliative care greatly appreciated. Recent CT chest December 2021 was also negative for PE, but did show spreading metastases Can d/c  decadron per Oncologist  6.  Transaminitis Likely secondary to metastatic disease.  Follow.  7.  Hypokalemia Vomited with p.o. potassium but was able to subsequently tolerate tablets Given a prescription for potassium replacement on discharge  Consultations:  Oncology  Discharge Exam: Vitals:   04/23/20 2056 04/24/20 0514  BP: (!) 152/69 (!) 146/69  Pulse: 94 93  Resp: 16 16  Temp: 98 F (36.7 C) (!) 97.4 F (36.3 C)  SpO2: 91% 93%    Subj on day of d/c   Awake coherent no distress Did not really want to eat as she was afraid to earlier today No chest pain Passing gas Feels swollen but not unusual for her  General Exam on discharge  Awake alert coherent Upper extremities slightly swollen--no cord appreciated no tenderness or erythema Lower extremities bilateral swelling ROM intact no focal deficits Power 5/5  Discharge Instructions   Discharge Instructions    Diet - low sodium heart healthy   Complete by: As directed    Discharge instructions   Complete by: As directed    You have been given medications on discharge---  Reglan is a medication that will help you eat and keep down food it helps to move things through the gut please continue to take this and follow-up with your outpatient physicians complete the Keflex for your Klebsiella UTI Your potassium was slightly low and you will need to recheck labs in the next several days I would encourage you to continue to attempt to eat slowly but surely a soft diet as this will give you nutrition you need  Increase activity slowly   Complete by: As directed      Allergies as of 04/24/2020      Reactions   Neosporin [neomycin-bacitracin Zn-polymyx] Swelling, Rash, Other (See Comments)   Blisters   Bactrim [sulfamethoxazole-trimethoprim]    Made her feel very sick   Benzalkonium Chloride Other (See Comments)   Redness (Pt is unaware)  It works by killing microorganisms and inhibiting their future growth,  and for this reason frequently appears as an ingredient in antibacterial hand wipes, antiseptic creams and anti-itch ointments.   Latex Rash      Medication List    STOP taking these medications   dexamethasone 4 MG tablet Commonly known as: DECADRON   Pulse Oximeter For Finger Misc     TAKE these medications   cephALEXin 500 MG capsule Commonly known as: KEFLEX Take 1 capsule (500 mg total) by mouth every 12 (twelve) hours for 1 day.   LORazepam 0.5 MG tablet Commonly known as: ATIVAN Place 1 tablet (0.5 mg total) under the tongue every 8 (eight) hours as needed for anxiety. PT to use for nausea as needed   losartan 25 MG tablet Commonly known as: COZAAR Take 0.5 tablets (12.5 mg total) by mouth daily. What changed: See the new instructions.   metoCLOPramide 10 MG tablet Commonly known as: REGLAN Take 1 tablet (10 mg total) by mouth every 8 (eight) hours as needed for nausea.   ondansetron 4 MG disintegrating tablet Commonly known as: ZOFRAN-ODT Take 1 tablet (4 mg total) by mouth every 8 (eight) hours as needed for nausea or vomiting.   pantoprazole 40 MG tablet Commonly known as: PROTONIX Take 1 tablet (40 mg total) by mouth daily. Start taking on: April 25, 2020   potassium chloride 10 MEQ tablet Commonly known as: KLOR-CON Take 4 tablets (40 mEq total) by mouth daily for 4 days. Start taking on: April 25, 2020   prochlorperazine 10 MG tablet Commonly known as: COMPAZINE Take 1 tablet (10 mg total) by mouth every 6 (six) hours as needed (Nausea or vomiting).      Allergies  Allergen Reactions  . Neosporin [Neomycin-Bacitracin Zn-Polymyx] Swelling, Rash and Other (See Comments)    Blisters  . Bactrim [Sulfamethoxazole-Trimethoprim]     Made her feel very sick  . Benzalkonium Chloride Other (See Comments)    Redness (Pt is unaware)  It works by killing microorganisms and inhibiting their future growth, and for this reason frequently appears as an  ingredient in antibacterial hand wipes, antiseptic creams and anti-itch ointments.  . Latex Rash      The results of significant diagnostics from this hospitalization (including imaging, microbiology, ancillary and laboratory) are listed below for reference.    Significant Diagnostic Studies: MR Brain W Wo Contrast  Result Date: 03/26/2020 CLINICAL DATA:  Malignant neoplasm of overlapping sites of left breast in female, estrogen receptor positive. Breast cancer, staging; evaluate for brain metastases or meningeal spread. EXAM: MRI HEAD WITHOUT AND WITH CONTRAST TECHNIQUE: Multiplanar, multiecho pulse sequences of the brain and surrounding structures were obtained without and with intravenous contrast. CONTRAST:  42mL GADAVIST GADOBUTROL 1 MMOL/ML IV SOLN COMPARISON:  Head CT 03/05/2020. FINDINGS: Brain: Mild intermittent motion degradation. Cerebral volume is normal for age. There is pachymeningeal dural thickening and enhancement overlying the right greater than left cerebral convexities. This measures up to 8 mm in thickness overlying the right cerebral convexity (series 17, image 23). Findings are compatible with dural-based metastatic disease. Additionally, there is nodular dural based  metastatic disease along the mid falx measuring 4.5 x 1.0 cm (AP X TV). 1 cm enhancing lesion along the left cerebellum which appears dural based (series 16, image 37). Mild surrounding parenchymal edema. No other parenchymal lesions are identified. No significant cerebral white matter disease. Small chronic infarct within the right cerebellum. There is no acute infarct. No chronic intracranial blood products. No extra-axial fluid collection. No midline shift. Vascular: Expected proximal arterial flow voids. Skull and upper cervical spine: Inhomogeneous T1 hypointense signal throughout the calvarium. Sinuses/Orbits: Visualized orbits show no acute finding. Trace ethmoid and right maxillary sinus mucosal thickening.  Other: Numerous nonspecific small lesions within the bilateral scalp and visualized neck. These results will be called to the ordering clinician or representative by the Radiologist Assistant, and communication documented in the PACS or [employer]. IMPRESSION: Pachymeningeal dural thickening and enhancement overlying the right greater than left cerebral convexities measuring up to 8 mm in thickness. Findings are compatible with dural-based metastatic disease. Additional nodular dural-based metastatic disease along the mid falx measuring 4.5 x 1.0 cm (AP x TV). 1 cm enhancing lesion along the left cerebellum which appears dural-based. This may reflect additional dural-based metastatic disease or a small meningioma. Mild parenchymal edema within the underlying left cerebellum. Inhomogeneous T1 hypointense signal within the bilateral calvarium, highly suspicious for calvarial metastases. Numerous small nonspecific lesions within the bilateral scalp and visualized neck. Direct visualization recommended. Small chronic lacunar infarct within the right cerebellar hemisphere. Electronically Signed   By: Jackey Loge DO   On: 03/26/2020 14:52   DG Abdomen Acute W/Chest  Result Date: 04/20/2020 CLINICAL DATA:  Vomiting cancer EXAM: DG ABDOMEN ACUTE WITH 1 VIEW CHEST COMPARISON:  CT chest 02/27/2020, PET CT 03/05/2020 FINDINGS: Single view chest demonstrates right upper extremity central venous catheter tip at the cavoatrial junction. Postsurgical changes of left chest and axilla. No focal opacity or pleural effusion. Normal cardiomediastinal silhouette. Supine and upright views of the abdomen demonstrate no free air beneath the diaphragm. Surgical clips in the right upper quadrant. Multiple round calcifications in the left upper quadrant consistent with splenic granuloma. Calcified left lung base granuloma. Nonobstructed gas pattern. Staghorn calculus right kidney. Known skeletal metastatic disease is better seen on  PET CT. IMPRESSION: Nonobstructed gas pattern. Staghorn calculus right kidney. No radiographic evidence for acute cardiopulmonary abnormality. Electronically Signed   By: Jasmine Pang M.D.   On: 04/20/2020 16:21   IR PICC PLACEMENT RIGHT >5 YRS INC IMG GUIDE  Result Date: 04/10/2020 INDICATION: Patient with history of metastatic breast cancer and poor venous access; central venous access requested for chemotherapy. EXAM: ULTRASOUND AND FLUOROSCOPIC GUIDED PICC LINE INSERTION MEDICATIONS: 1% lidocaine to skin and subcutaneous tissue ANESTHESIA/SEDATION: None FLUOROSCOPY TIME:  Fluoroscopy Time: 1 minutes 6 seconds (6 mGy). COMPLICATIONS: None immediate. TECHNIQUE: The procedure, risks, benefits, and alternatives were explained to the patient and informed written consent was obtained. A timeout was performed prior to the initiation of the procedure. The right upper extremity was prepped with chlorhexidine in a sterile fashion, and a sterile drape was applied covering the operative field. Maximum barrier sterile technique with sterile gowns and gloves were used for the procedure. A timeout was performed prior to the initiation of the procedure. Local anesthesia was provided with 1% lidocaine. Under direct ultrasound guidance, the right basilic vein was accessed with a micropuncture kit after the overlying soft tissues were anesthetized with 1% lidocaine. An ultrasound image was saved for documentation purposes. A guidewire was advanced to the level  of the superior caval-atrial junction for measurement purposes and the PICC line was cut to length. A peel-away sheath was placed and a 47 cm, 5 [place], dual lumen was inserted to level of the superior caval-atrial junction. A post procedure spot fluoroscopic was obtained. The catheter easily aspirated and flushed and was sutured in place. A dressing was placed. The patient tolerated the procedure well without immediate post procedural complication. FINDINGS: After  catheter placement, the tip lies within the superior cavoatrial junction the catheter aspirates and flushes normally and is ready for immediate use. IMPRESSION: Successful ultrasound and fluoroscopic guided placement of a right basilic vein approach, 47 cm, 5 French, dual lumen PICC with tip at the superior caval-atrial junction. The PICC line is ready for immediate use. Read by: Rowe Robert, PA-C Electronically Signed   By: Jacqulynn Cadet M.D.   On: 04/10/2020 09:42   DG ESOPHAGUS W SINGLE CM (SOL OR THIN BA)  Result Date: 04/23/2020 CLINICAL DATA:  Dysphagia. EXAM: ESOPHOGRAM/BARIUM SWALLOW TECHNIQUE: Single contrast examination was performed using  thin barium. FLUOROSCOPY TIME:  Radiation Exposure Index (if provided by the fluoroscopic device): 17.9 mGy. COMPARISON:  None. FINDINGS: Exam is limited as the patient only drank a small amount of contrast. No definite mass or stricture is noted in the esophagus small sliding-type hiatal hernia is noted. No definite reflux is noted. Barium tablet could not be administered as the patient could not be placed in upright position. IMPRESSION: Small sliding-type hiatal hernia. No definite mass or stricture is noted in the esophagus. Electronically Signed   By: Marijo Conception M.D.   On: 04/23/2020 09:15    Microbiology: Recent Results (from the past 240 hour(s))  SARS CORONAVIRUS 2 (TAT 6-24 HRS) Nasopharyngeal Nasopharyngeal Swab     Status: None   Collection Time: 04/20/20  5:36 PM   Specimen: Nasopharyngeal Swab  Result Value Ref Range Status   SARS Coronavirus 2 NEGATIVE NEGATIVE Final    Comment: (NOTE) SARS-CoV-2 target nucleic acids are NOT DETECTED.  The SARS-CoV-2 RNA is generally detectable in upper and lower respiratory specimens during the acute phase of infection. Negative results do not preclude SARS-CoV-2 infection, do not rule out co-infections with other pathogens, and should not be used as the sole basis for treatment or other  patient management decisions. Negative results must be combined with clinical observations, patient history, and epidemiological information. The expected result is Negative.  Fact Sheet for Patients: SugarRoll.be  Fact Sheet for Healthcare Providers: https://www.woods-mathews.com/  This test is not yet approved or cleared by the [place] FDA and  has been authorized for detection and/or diagnosis of SARS-CoV-2 by FDA under an Emergency Use Authorization (EUA). This EUA will remain  in effect (meaning this test can be used) for the duration of the COVID-19 declaration under Se ction 564(b)(1) of the Act, 21 U.S.C. section 360bbb-3(b)(1), unless the authorization is terminated or revoked sooner.  Performed at [facility], [place] [address] [place], [place] [address]   Culture, Urine     Status: Abnormal   Collection Time: 04/21/20  9:19 AM   Specimen: Urine, Catheterized  Result Value Ref Range Status   Specimen Description   Final    URINE, CATHETERIZED Performed at [facility] [address] [place], [place] [address]    Special Requests   Final    NONE Performed at [facility], [place] [address], [place], [place] [address]    Culture >=100,000 COLONIES/mL KLEBSIELLA  PNEUMONIAE (A)  Final   Report Status 04/23/2020 FINAL  Final   Organism ID, Bacteria KLEBSIELLA PNEUMONIAE (A)  Final      Susceptibility   Klebsiella pneumoniae - MIC*    AMPICILLIN >=32 RESISTANT Resistant     CEFAZOLIN <=4 SENSITIVE Sensitive     CEFEPIME <=0.12 SENSITIVE Sensitive     CEFTRIAXONE <=0.25 SENSITIVE Sensitive     CIPROFLOXACIN <=0.25 SENSITIVE Sensitive     GENTAMICIN <=1 SENSITIVE Sensitive     IMIPENEM <=0.25 SENSITIVE Sensitive     NITROFURANTOIN 64 INTERMEDIATE Intermediate     TRIMETH/SULFA <=20 SENSITIVE Sensitive     AMPICILLIN/SULBACTAM 4 SENSITIVE Sensitive     PIP/TAZO <=4 SENSITIVE  Sensitive     * >=100,000 COLONIES/mL KLEBSIELLA PNEUMONIAE     Labs: Basic Metabolic Panel: Recent Labs  Lab 04/20/20 1305 04/20/20 1929 04/21/20 0451 04/22/20 0513 04/23/20 0446 04/24/20 0438  NA 138  --  138 135 136 138  K 3.5  --  3.2* 3.6 3.2* 3.4*  CL 102  --  102 101 102 102  CO2 20*  --  21* $Re'22 23 22  'EqK$ GLUCOSE 113*  --  106* 97 102* 128*  BUN 10  --  10 9 7* 5*  CREATININE 0.70  --  0.81 0.71 0.57 0.54  CALCIUM 7.9*  --  7.8* 8.0* 8.0* 8.0*  MG  --  2.1  --  2.0  --   --   PHOS  --   --   --  2.4*  --   --    Liver Function Tests: Recent Labs  Lab 04/20/20 1305 04/21/20 0451 04/22/20 0513 04/23/20 0446 04/24/20 0438  AST 95* 83*  --  90* 93*  ALT 37 32  --  37 37  ALKPHOS 251* 186*  --  195* 205*  BILITOT 1.7* 2.0*  --  1.4* 1.4*  PROT 5.5* 4.7*  --  5.0* 5.0*  ALBUMIN 3.0* 2.8* 2.9* 2.7* 2.8*   No results for input(s): LIPASE, AMYLASE in the last 168 hours. No results for input(s): AMMONIA in the last 168 hours. CBC: Recent Labs  Lab 04/20/20 1305 04/21/20 0451 04/22/20 0513 04/23/20 0446 04/24/20 0438  WBC 7.3 6.2 6.6 5.6 4.9  NEUTROABS 5.5  --  4.8 4.0 3.5  HGB 10.6* 8.9* 9.3* 9.2* 9.4*  HCT 32.5* 27.9* 29.8* 28.2* 30.1*  MCV 86.7 90.0 91.4 89.0 91.2  PLT 83* 80* 92* 86* 89*   Cardiac Enzymes: No results for input(s): CKTOTAL, CKMB, CKMBINDEX, TROPONINI in the last 168 hours. BNP: BNP (last 3 results) No results for input(s): BNP in the last 8760 hours.  ProBNP (last 3 results) No results for input(s): PROBNP in the last 8760 hours.  CBG: No results for input(s): GLUCAP in the last 168 hours.     Signed:  Nita Sells MD   [facility] 04/24/2020, 1:09 PM

## 2020-04-24 NOTE — Care Management Important Message (Signed)
Important Message  Patient Details IM Letter given to the Patient. Name: Brooke [name] MRN: [id number] Date of Birth: 1950/08/04   Medicare Important Message Given:  Yes     Kerin Salen 04/24/2020, 12:10 PM

## 2020-04-27 ENCOUNTER — Other Ambulatory Visit: Payer: Self-pay

## 2020-04-27 ENCOUNTER — Inpatient Hospital Stay: Payer: Medicare PPO

## 2020-04-27 ENCOUNTER — Telehealth: Payer: Self-pay

## 2020-04-27 DIAGNOSIS — Z452 Encounter for adjustment and management of vascular access device: Secondary | ICD-10-CM

## 2020-04-27 DIAGNOSIS — Z800 Family history of malignant neoplasm of digestive organs: Secondary | ICD-10-CM | POA: Diagnosis not present

## 2020-04-27 DIAGNOSIS — C50812 Malignant neoplasm of overlapping sites of left female breast: Secondary | ICD-10-CM

## 2020-04-27 DIAGNOSIS — Z803 Family history of malignant neoplasm of breast: Secondary | ICD-10-CM | POA: Diagnosis not present

## 2020-04-27 DIAGNOSIS — C7800 Secondary malignant neoplasm of unspecified lung: Secondary | ICD-10-CM

## 2020-04-27 DIAGNOSIS — Z170 Estrogen receptor positive status [ER+]: Secondary | ICD-10-CM

## 2020-04-27 DIAGNOSIS — Z5111 Encounter for antineoplastic chemotherapy: Secondary | ICD-10-CM | POA: Diagnosis not present

## 2020-04-27 DIAGNOSIS — C50912 Malignant neoplasm of unspecified site of left female breast: Secondary | ICD-10-CM | POA: Diagnosis not present

## 2020-04-27 DIAGNOSIS — C787 Secondary malignant neoplasm of liver and intrahepatic bile duct: Secondary | ICD-10-CM | POA: Diagnosis not present

## 2020-04-27 DIAGNOSIS — E860 Dehydration: Secondary | ICD-10-CM | POA: Diagnosis not present

## 2020-04-27 DIAGNOSIS — Z8049 Family history of malignant neoplasm of other genital organs: Secondary | ICD-10-CM | POA: Diagnosis not present

## 2020-04-27 DIAGNOSIS — C7951 Secondary malignant neoplasm of bone: Secondary | ICD-10-CM | POA: Diagnosis not present

## 2020-04-27 DIAGNOSIS — Z8249 Family history of ischemic heart disease and other diseases of the circulatory system: Secondary | ICD-10-CM | POA: Diagnosis not present

## 2020-04-27 DIAGNOSIS — D649 Anemia, unspecified: Secondary | ICD-10-CM | POA: Diagnosis not present

## 2020-04-27 DIAGNOSIS — Z9012 Acquired absence of left breast and nipple: Secondary | ICD-10-CM | POA: Diagnosis not present

## 2020-04-27 DIAGNOSIS — Z8042 Family history of malignant neoplasm of prostate: Secondary | ICD-10-CM | POA: Diagnosis not present

## 2020-04-27 DIAGNOSIS — M8580 Other specified disorders of bone density and structure, unspecified site: Secondary | ICD-10-CM | POA: Diagnosis not present

## 2020-04-27 DIAGNOSIS — Z833 Family history of diabetes mellitus: Secondary | ICD-10-CM | POA: Diagnosis not present

## 2020-04-27 DIAGNOSIS — Z79899 Other long term (current) drug therapy: Secondary | ICD-10-CM | POA: Diagnosis not present

## 2020-04-27 DIAGNOSIS — I10 Essential (primary) hypertension: Secondary | ICD-10-CM | POA: Diagnosis not present

## 2020-04-27 MED ORDER — SODIUM CHLORIDE 0.9% FLUSH
10.0000 mL | Freq: Once | INTRAVENOUS | Status: AC
  Administered 2020-04-27: 10 mL
  Filled 2020-04-27: qty 10

## 2020-04-27 MED ORDER — HEPARIN SOD (PORK) LOCK FLUSH 100 UNIT/ML IV SOLN
250.0000 [IU] | Freq: Once | INTRAVENOUS | Status: AC
  Administered 2020-04-27: 250 [IU]
  Filled 2020-04-27: qty 5

## 2020-04-27 NOTE — Telephone Encounter (Signed)
Contacted pt to check in on her after her hospital stay. Pt states she is still having nausea but the Reglan works, it just makes her sleepy. Pt stated she was interested in having a palliative care referral done. This was something she discussed with the MD in the hospital . (post phone call per Dr [name] referral to [facility] made for palliative care). Pt stated that she had an O2 tank at her home that needed to be picked up. It has been there for about a month. (Post phone call/  [facility] contacted and they will facilitate picking up the O2 tank). Pt stated that she has some leg edema and has been elevating her legs. Pt stated the only way she could keep fluids down was to sip. Offered for pt to come in for fluids if she felt like she needed them but she stated she was doing okay for now. Pt instructed to call office if she starts to feel any worse. Pt verbalized understanding.

## 2020-04-27 NOTE — Telephone Encounter (Signed)
Spoke with patient's caregiver Dolphus Jenny and scheduled an in-person Palliative Consult for 05/04/20 @ 1 PM  COVID screening was negative. No pets in home. Patient lives alone, but has caregivers.  Consent obtained; updated Outlook/Netsmart/Team List and Epic.  Family is aware they may be receiving a call from NP the day before or day of to confirm appointment.

## 2020-04-27 NOTE — Patient Instructions (Signed)

## 2020-04-28 ENCOUNTER — Encounter: Payer: Self-pay | Admitting: Licensed Clinical Social Worker

## 2020-04-28 ENCOUNTER — Telehealth: Payer: Self-pay | Admitting: Family Medicine

## 2020-04-28 NOTE — Progress Notes (Signed)
[place] Clinical Social Work  Holiday representative received TC from patient's friend & landlord, Lajoyce Lauber (who is also a retired Merchandiser, retail)  to discuss her concerns re: patient and pt's situation. CSW did not share information with Ms. Lilia Pro as she is not on chart, but received the following concerns:  - Ms. Lilia Pro and another friend have been helping with transportation but no longer feel comfortable with this as patient is having more and more difficulty ambulating and they are concerned that she will fall - Patient is having more accidents as she cannot always get to the bathroom - Pt told she would be getting a walker, but has not received it yet - Patient is not eating anything (Ms. Lilia Pro gets her groceries for her), but she does not eat, is only drinking water - Per Ms. 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 Lilia Pro concerned that patient was even released from hospital due to "horrible" quality of life and unsafe situation. Asked about palliative care family conference (has palliative appt for 3/7- CSW unable to share this information as caller is not in chart. - Ms. Lilia Pro asked about the walker, transportation help, and home health referral possible to help with port flushes     After receiving the above information, CSW contacted patient directly. Patient agreed to referral to transportation department (CSW made referral today) and is open to a referral to home health. She cannot recall who was supposed to supply a walker.  Patient also voiced concern that she is having more trouble lifting her legs to even get up and down. CSW transferred to nurse line and will cc medical team for advice.  Patient was adamant that she wants to stay in her home and not go into any other placement.    [place], [place] Worker [employer]

## 2020-04-28 NOTE — Telephone Encounter (Signed)
   Brooke [name] DOB: April 20, 1950 MRN: [id number]   RIDER WAIVER AND RELEASE OF LIABILITY  For purposes of improving physical access to our facilities, [facility] is pleased to partner with third parties to provide [place] patients or other authorized individuals the option of convenient, on-demand ground transportation services (the [employer]") through use of the technology service that enables users to request on-demand ground transportation from independent third-party providers.  By opting to use and accept these [employer], I, the undersigned, hereby agree on behalf of myself, and on behalf of any minor child using the [employer] for whom I am the parent or legal guardian, as follows:  1. Government social research officer provided to me are provided by independent third-party transportation providers who are not [employer] or employees and who are unaffiliated with [employer]. 2. [facility] is neither a transportation carrier nor a common or public carrier. 3. [facility] has no control over the quality or safety of the transportation that occurs as a result of the [employer]. 4. [facility] cannot guarantee that any third-party transportation provider will complete any arranged transportation service. 5. [facility] makes no representation, warranty, or guarantee regarding the reliability, timeliness, quality, safety, suitability, or availability of any of the Transport Services or that they will be error free. 6. I fully understand that traveling by vehicle involves risks and dangers of serious bodily injury, including permanent disability, paralysis, and death. I agree, on behalf of myself and on behalf of any minor child using the Transport Services for whom I am the parent or legal guardian, that the entire risk arising out of my use of the [employer] remains solely with me, to the maximum extent permitted under applicable law. 7. The [employer] are provided "as is" and "as available." [facility] disclaims all representations and warranties, express, implied or statutory, not expressly set out in these terms, including the implied warranties of merchantability and fitness for a particular purpose. 8. I hereby waive and release [facility], its agents, employees, officers, directors, representatives, insurers, attorneys, assigns, successors, subsidiaries, and affiliates from any and all past, present, or future claims, demands, liabilities, actions, causes of action, or suits of any kind directly or indirectly arising from acceptance and use of the [employer]. 9. I further waive and release [facility] and its affiliates from all present and future liability and responsibility for any injury or death to persons or damages to property caused by or related to the use of the [employer]. 10. I have read this Waiver and Release of Liability, and I understand the terms used in it and their legal significance. This Waiver is freely and voluntarily given with the understanding that my right (as well as the right of any minor child for whom I am the parent or legal guardian using the [employer]) to legal recourse against [facility] in connection with the [employer] is knowingly surrendered in return for use of these services.   I attest that I read the consent document to Brooke [name], gave Ms. Borski the opportunity to ask questions and answered the questions asked (if any). I affirm that Brooke [name] then provided consent for she's participation in this program.     Drucie Ip

## 2020-04-29 ENCOUNTER — Other Ambulatory Visit: Payer: Self-pay

## 2020-04-29 ENCOUNTER — Inpatient Hospital Stay: Payer: Medicare PPO | Attending: Oncology

## 2020-04-29 DIAGNOSIS — Z452 Encounter for adjustment and management of vascular access device: Secondary | ICD-10-CM

## 2020-04-29 DIAGNOSIS — Z803 Family history of malignant neoplasm of breast: Secondary | ICD-10-CM | POA: Insufficient documentation

## 2020-04-29 DIAGNOSIS — I10 Essential (primary) hypertension: Secondary | ICD-10-CM | POA: Insufficient documentation

## 2020-04-29 DIAGNOSIS — Z8042 Family history of malignant neoplasm of prostate: Secondary | ICD-10-CM | POA: Insufficient documentation

## 2020-04-29 DIAGNOSIS — C50912 Malignant neoplasm of unspecified site of left female breast: Secondary | ICD-10-CM | POA: Diagnosis not present

## 2020-04-29 DIAGNOSIS — Z170 Estrogen receptor positive status [ER+]: Secondary | ICD-10-CM | POA: Insufficient documentation

## 2020-04-29 DIAGNOSIS — M8580 Other specified disorders of bone density and structure, unspecified site: Secondary | ICD-10-CM | POA: Insufficient documentation

## 2020-04-29 DIAGNOSIS — Z79899 Other long term (current) drug therapy: Secondary | ICD-10-CM | POA: Insufficient documentation

## 2020-04-29 DIAGNOSIS — Z8249 Family history of ischemic heart disease and other diseases of the circulatory system: Secondary | ICD-10-CM | POA: Diagnosis not present

## 2020-04-29 DIAGNOSIS — C787 Secondary malignant neoplasm of liver and intrahepatic bile duct: Secondary | ICD-10-CM | POA: Insufficient documentation

## 2020-04-29 DIAGNOSIS — Z8049 Family history of malignant neoplasm of other genital organs: Secondary | ICD-10-CM | POA: Insufficient documentation

## 2020-04-29 DIAGNOSIS — C50812 Malignant neoplasm of overlapping sites of left female breast: Secondary | ICD-10-CM

## 2020-04-29 DIAGNOSIS — C7951 Secondary malignant neoplasm of bone: Secondary | ICD-10-CM | POA: Diagnosis not present

## 2020-04-29 DIAGNOSIS — Z800 Family history of malignant neoplasm of digestive organs: Secondary | ICD-10-CM | POA: Insufficient documentation

## 2020-04-29 DIAGNOSIS — Z9012 Acquired absence of left breast and nipple: Secondary | ICD-10-CM | POA: Diagnosis not present

## 2020-04-29 DIAGNOSIS — Z79811 Long term (current) use of aromatase inhibitors: Secondary | ICD-10-CM | POA: Insufficient documentation

## 2020-04-29 MED ORDER — SODIUM CHLORIDE 0.9% FLUSH
10.0000 mL | Freq: Once | INTRAVENOUS | Status: AC
  Administered 2020-04-29: 10 mL
  Filled 2020-04-29: qty 10

## 2020-04-29 MED ORDER — HEPARIN SOD (PORK) LOCK FLUSH 100 UNIT/ML IV SOLN
250.0000 [IU] | Freq: Once | INTRAVENOUS | Status: AC
  Administered 2020-04-29: 250 [IU]
  Filled 2020-04-29: qty 5

## 2020-04-29 NOTE — Patient Instructions (Signed)
PICC Home Care Guide A peripherally inserted central catheter (PICC) is a form of IV access that allows medicines and IV fluids to be quickly distributed throughout the body. The PICC is a long, thin, flexible tube (catheter) that is inserted into a vein in the upper arm. The catheter ends in a large vein in the chest (superior vena cava, or SVC). After the PICC is inserted, a chest X-ray may be done to make sure that it is in the correct place. A PICC may be placed for different reasons, such as:  To give medicines and liquid nutrition.  To give IV fluids and blood products.  If there is trouble placing a peripheral intravenous (PIV) catheter. If taken care of properly, a PICC can remain in place for several months. Having a PICC can also allow a person to go home from the hospital sooner. Medicine and PICC care can be managed at home by a family member, caregiver, or home health care team. What are the risks? Generally, having a PICC is safe. However, problems may occur, including:  A blood clot (thrombus) forming in or at the tip of the PICC.  A blood clot forming in a vein (deep vein thrombosis) or traveling to the lung (pulmonary embolism).  Inflammation of the vein (phlebitis) in which the PICC is placed.  Infection. Central line associated blood stream infection (CLABSI) is a serious infection that often requires hospitalization.  PICC movement (malposition). The PICC tip may move from its original position due to excessive physical activity, forceful coughing, sneezing, or vomiting.  A break or cut in the PICC. It is important not to use scissors near the PICC.  Nerve or tendon irritation or injury during PICC insertion. How to take care of your PICC Preventing problems  You and any caregivers should wash your hands often with soap. Wash hands: ? Before touching the PICC line or the infusion device. ? Before changing a bandage (dressing).  Flush the PICC as told by your  health care provider. Let your health care provider know right away if the PICC is hard to flush or does not flush. Do not use force to flush the PICC.  Do not use a syringe that is less than 10 mL to flush the PICC.  Avoid blood pressure checks on the arm in which the PICC is placed.  Never pull or tug on the PICC.  Do not take the PICC out yourself. Only a trained clinical professional should remove the PICC.  Use clean and sterile supplies only. Keep the supplies in a dry place. Do not reuse needles, syringes, or any other supplies. Doing that can lead to infection.  Keep pets and children away from your PICC line.  Check the PICC insertion site every day for signs of infection. Check for: ? Leakage. ? Redness, swelling, or pain. ? Fluid or blood. ? Warmth. ? Pus or a bad smell. PICC dressing care  Keep your PICC bandage (dressing) clean and dry to prevent infection.  Do not take baths, swim, or use a hot tub until your health care provider approves. Ask your health care provider if you can take showers. You may only be allowed to take sponge baths for bathing. When you are allowed to shower: ? Ask your health care provider to teach you how to wrap the PICC line. ? Cover the PICC line with clear plastic wrap and tape to keep it dry while showering.  Follow instructions from your health care provider about   how to take care of your insertion site and dressing. Make sure you: ? Wash your hands with soap and water before you change your bandage (dressing). If soap and water are not available, use hand sanitizer. ? Change your dressing as told by your health care provider. ? Leave stitches (sutures), skin glue, or adhesive strips in place. These skin closures may need to stay in place for 2 weeks or longer. If adhesive strip edges start to loosen and curl up, you may trim the loose edges. Do not remove adhesive strips completely unless your health care provider tells you to do  that.  Change your PICC dressing if it becomes loose or wet. General instructions  Carry your PICC identification card or wear a medical alert bracelet at all times.  Keep the tube clamped at all times, unless it is being used.  Carry a smooth-edge clamp with you at all times to place on the tube if it breaks.  Do not use scissors or sharp objects near the tube.  You may bend your arm and move it freely. If your PICC is near or at the bend of your elbow, avoid activity with repeated motion at the elbow.  Avoid lifting heavy objects as told by your health care provider.  Keep all follow-up visits as told by your health care provider. This is important.   Disposal of supplies  Throw away any syringes in a disposal container that is meant for sharp items (sharps container). You can buy a sharps container from a pharmacy, or you can make one by using an empty hard plastic bottle with a cover.  Place any used dressings or infusion bags into a plastic bag. Throw that bag in the trash. Contact a health care provider if:  You have pain in your arm, ear, face, or teeth.  You have a fever or chills.  You have redness, swelling, or pain around the insertion site.  You have fluid or blood coming from the insertion site.  Your insertion site feels warm to the touch.  You have pus or a bad smell coming from the insertion site.  Your skin feels hard and raised around the insertion site. Get help right away if:  Your PICC is accidentally pulled all the way out. If this happens, cover the insertion site with a bandage or gauze dressing. Do not throw the PICC away. Your health care provider will need to check it.  Your PICC was tugged or pulled and has partially come out. Do not  push the PICC back in.  You cannot flush the PICC, it is hard to flush, or the PICC leaks around the insertion site when it is flushed.  You hear a "flushing" sound when the PICC is flushed.  You feel your  heart racing or skipping beats.  There is a hole or tear in the PICC.  You have swelling in the arm in which the PICC was inserted.  You have a red streak going up your arm from where the PICC was inserted. Summary  A peripherally inserted central catheter (PICC) is a long, thin, flexible tube (catheter) that is inserted into a vein in the upper arm.  The PICC is inserted using a sterile technique by a specially trained nurse or physician. Only a trained clinical professional should remove it.  Keep your PICC identification card with you at all times.  Avoid blood pressure checks on the arm in which the PICC is placed.  If cared for   properly, a PICC can remain in place for several months. Having a PICC can also allow a person to go home from the hospital sooner. This information is not intended to replace advice given to you by your health care provider. Make sure you discuss any questions you have with your health care provider. Document Revised: 06/26/2019 Document Reviewed: 06/26/2019 Elsevier Patient Education  2021 [employer]  

## 2020-04-29 NOTE — Progress Notes (Signed)
[place]  Telephone:([phone] [phone]) [phone]    ID: Brooke [name] DOB: 02-May-1950  MR#: [id number]  EUM#:[id number]  Patient Care Team: Brooke Macadam, MD as PCP - General (Family Medicine) Brooke Bookbinder, MD as Attending Physician (Dermatology) Brooke [name], Brooke Dad, MD as Consulting Physician (Oncology) Brooke Rudd, MD as Consulting Physician (Radiation Oncology) Brooke Kussmaul, MD as Consulting Physician (General Surgery) Brooke Limbo, MD as Consulting Physician (Plastic Surgery) Brooke [name], Brooke Massed, NP as Nurse Practitioner (Hematology and Oncology) Brooke Bruins, MD as Consulting Physician (Obstetrics and Gynecology) Brooke Morale, MD as Consulting Physician (Family Medicine) OTHER MD:   CHIEF COMPLAINT: Estrogen receptor positive breast cancer (s/p left mastectomy) now widely metastatic  CURRENT TREATMENT: Weekly paclitaxel; zoledronate   INTERVAL HISTORY: Brooke [name] returns today for follow-up and treatment of her now widely disseminated breast cancer, which is weakly estrogen receptor positive.    At her last visit, she reported being unable to eat, being unable to keep anything down, diarrhea, not having oxygen at home. She was transferred to the ED following her appointment and was admitted.  In the course of the admission she was made a limited resuscitation (no CPR and no intubation).  After discharge she contacted [place] requesting referral to palliative care and that referral was placed.  She started weekly paclitaxel on 03/31/2019.  She received a second dose on 04/06/2020.  Today would be her 3rd dose.   She also started zoledronate on 03/19/2020, to be repeated every 12 weeks.  This caused her severe bone pain.  We will address that when we repeat that in April.   REVIEW OF SYSTEMS: [name] is no better than she was when we referred her to the emergency room.  She still cannot eat, is severely malnourished, and is getting some  peripheral edema likely related to that.  She can barely swallow liquids she says.  She had a barium swallow 04/23/2020 during her admission which she really did not show a mass or stricture and this showed a small sliding hiatal hernia which is not relevant I do not think.  She tells me her legs feel like lead and she is not safe to walk but has not been able to get either a wheelchair or a walker.  She would like to add her landlady, who happens to be a retired Merchandiser, retail to her list of contacts and her name is Brooke [name] [phone] and we will add her today.   COVID 19 VACCINATION STATUS: fully vaccinated [employer]), with booster 12/28/2019    HISTORY OF CURRENT ILLNESS: From the original intake note:  [name] herself palpated a change in her left breast and brought this to medical attention.  On 01/18/2017 she underwent bilateral diagnostic mammography with tomography and bilateral breast ultrasonography at [facility].  The breast density was category B.  At the most recent exam was from December 2016.  In the right breast upper outer quadrant there was a 1 cm irregular mass associated with punctate calcification.  By ultrasound this was not well-defined, and it was biopsied on 01/26/2017 with tomographic guidance this showed a complex sclerosing lesion with the usual ductal hyperplasia.  The right axilla was sonographically  On the left mammography showed a 4 cm dense mass with indistinct margins in the upper inner quadrant.  Ultrasound defined a 3 cm irregular mass in the upper inner quadrant of the left breast which was palpable.  There was an abnormal lymph node in the left axilla.  Biopsy of the left mass and abnormal lymph node ([id number], on 01/26/2017) showed both to be involved by invasive ductal carcinoma, with some lobular features but E-cadherin positive, estrogen and progesterone receptors both 100% positive, with strong staining intensity, with MIB-1 between 15-20%.  There was no HER-2  amplification, the signals ratio being 1.31/1.58 and the number per cell 2.30/2.05  The patient's subsequent history is as detailed below.   PAST MEDICAL HISTORY: Past Medical History:  Diagnosis Date  . Anemia   . Anxiety   . Arthritis   . ASCUS of cervix with negative high risk HPV 12/2018  . Bilateral dry eyes   . Breast cancer, left breast ([place])   . Family history of breast cancer 02/02/2017  . Family history of breast cancer   . Family history of colon cancer 02/02/2017  . Family history of colon cancer   . Family history of prostate cancer   . Family history of prostate cancer in father 02/02/2017  . Family history of uterine cancer   . Fibroid   . History of kidney stones   . Hyperlipidemia   . Hypertension   . Melanoma of upper arm ([place]) 2009   RIGHT ARM   . Obesity (BMI 30.0-34.9)   . Osteopenia 09/2017   T score -1.9 max 8% / 0.8% statistically significant decline at right and left hip stable at the spine  . PONV (postoperative nausea and vomiting)   . Squamous carcinoma     of the skin  . UTI (urinary tract infection)   . Varicose veins of both lower extremities   . Vitamin D deficiency 07/2009   LOW VITAMIN D 29  . Vitamin deficiency 07/2008   VITAMIN D LOW 23    PAST SURGICAL HISTORY: Past Surgical History:  Procedure Laterality Date  . ABDOMINAL SURGERY  1975   ovairan cyst   . APPENDECTOMY    . BREAST LUMPECTOMY WITH RADIOACTIVE SEED LOCALIZATION Right 09/25/2017   Procedure: RIGHT BREAST LUMPECTOMY WITH RADIOACTIVE SEED LOCALIZATION;  Surgeon: Brooke Kussmaul, MD;  Location: [place];  Service: General;  Laterality: Right;  . BREAST SURGERY     bio  . CHOLECYSTECTOMY    . DERMOID CYST  EXCISION    . DILATATION & CURETTAGE/HYSTEROSCOPY WITH MYOSURE N/A 04/23/2019   Procedure: DILATATION & CURETTAGE/HYSTEROSCOPY WITH MYOSURE;  Surgeon: Brooke Bruins, MD;  Location: [place];  Service: Gynecology;  Laterality: N/A;  request to follow at  9:30am in [facility] block requests 45 minutes  . DILATION AND CURETTAGE OF UTERUS  1998  . GALLBLADDER SURGERY    . HYSTEROSCOPY    . insertion  port a cath  03/02/2016  . MASTECTOMY MODIFIED RADICAL Left 09/25/2017   Procedure: LEFT MODIFIED RADICAL MASTECTOMY;  Surgeon: Brooke Kussmaul, MD;  Location: [place];  Service: General;  Laterality: Left;  . PORTACATH PLACEMENT N/A 03/02/2017   Procedure: INSERTION PORT-A-CATH;  Surgeon: Brooke Kussmaul, MD;  Location: [place];  Service: General;  Laterality: N/A;  . SKIN BIOPSY    . SKIN CANCER EXCISION  2010,2011   2010-RIGHT ARM, 2011-LEFT LOWER LEG.-DR Brooke [name] DERM. DR. Sarajane Jews IS HER DERM SURGEON.    FAMILY HISTORY Family History  Problem Relation Age of Onset  . Diabetes Father   . Hypertension Father   . Heart disease Father   . Prostate cancer Father 23  . Arthritis Father   . Breast cancer Sister 62  again at age 56, GT results unk  . Skin cancer Sister   . Colon cancer Paternal Grandfather 72  . Hypertension Mother   . Heart failure Mother   . Uterine cancer Sister 33       stage IV  . Breast cancer Cousin        mat first cousin dx under 89  The patient's father was diagnosed with prostate cancer in his 24s and died at age 33 from complications of that disease.  The patient's mother died at age 30 with congestive heart failure.  The patient had no brothers, 3 sisters.  One sister had breast cancer at age 4 and again at age 80.  She has been genetically tested and was found to be BRCA not mutated in addition a paternal grandfather had colon cancer in his 1s.  There is no history of ovarian cancer in the family to the patient's knowledge.   GYNECOLOGIC HISTORY:  Patient's last menstrual period was 03/18/1997. Menarche age 43. The patient is GX P0.  She stopped having periods in 1999 and took hormone replacement approximately 5 years.   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  She is now retired.  She is single and lives alone with no pets.    ADVANCED DIRECTIVES: Not in place   HEALTH MAINTENANCE: Social History   Tobacco Use  . Smoking status: Never Smoker  . Smokeless tobacco: Never Used  Vaping Use  . Vaping Use: Never used  Substance Use Topics  . Alcohol use: No  . Drug use: No    Colonoscopy: Never  PAP: 12/2019, negative  Bone density: 2017/osteopenia   Allergies  Allergen Reactions  . Neosporin [Neomycin-Bacitracin Zn-Polymyx] Swelling, Rash and Other (See Comments)    Blisters  . Bactrim [Sulfamethoxazole-Trimethoprim]     Made her feel very sick  . Benzalkonium Chloride Other (See Comments)    Redness (Pt is unaware)  It works by killing microorganisms and inhibiting their future growth, and for this reason frequently appears as an ingredient in antibacterial hand wipes, antiseptic creams and anti-itch ointments.  . Latex Rash    Current Outpatient Medications  Medication Sig Dispense Refill  . LORazepam (ATIVAN) 0.5 MG tablet Place 1 tablet (0.5 mg total) under the tongue every 8 (eight) hours as needed for anxiety. PT to use for nausea as needed 30 tablet 0  . losartan (COZAAR) 25 MG tablet Take 0.5 tablets (12.5 mg total) by mouth daily.    . metoCLOPramide (REGLAN) 10 MG tablet Take 1 tablet (10 mg total) by mouth every 8 (eight) hours as needed for nausea. 30 tablet 0  . ondansetron (ZOFRAN-ODT) 4 MG disintegrating tablet Take 1 tablet (4 mg total) by mouth every 8 (eight) hours as needed for nausea or vomiting. 20 tablet 0  . pantoprazole (PROTONIX) 40 MG tablet Take 1 tablet (40 mg total) by mouth daily. 30 tablet 0  . potassium chloride (KLOR-CON) 10 MEQ tablet Take 4 tablets (40 mEq total) by mouth daily for 4 days. 16 tablet 0  . prochlorperazine (COMPAZINE) 10 MG tablet Take 1 tablet (10 mg total) by mouth every 6 (six) hours as needed (Nausea  or vomiting). 30 tablet 1   No current facility-administered medications for this visit.   Facility-Administered Medications Ordered in Other Visits  Medication Dose Route Frequency Provider Last Rate Last Admin  . sodium chloride flush (NS)  0.9 % injection 10 mL  10 mL Intracatheter PRN Izza Bickle, Brooke Dad, MD   10 mL at 04/30/20 1233     OBJECTIVE:  white woman examined in a wheelchair Vitals:   04/30/20 0828  BP: (!) 159/68  Pulse: 95  Resp: 18  Temp: 97.7 F (36.5 C)  SpO2: 97%     Body mass index is 27.35 kg/m.   Wt Readings from Last 3 Encounters:  04/30/20 185 lb 3.2 oz (84 kg)  04/20/20 184 lb (83.5 kg)  04/20/20 184 lb (83.5 kg)  ECOG FS:1 - Symptomatic but completely ambulatory   Sclerae unicteric, EOMs intact Wearing a mask The mass at the nape of the neck appears minimally smaller. Lungs no rales or rhonchi Heart regular rate and rhythm Abd soft, nontender, positive bowel sounds MSK no focal spinal tenderness, bilateral lower extremity edema Neuro: nonfocal, well oriented, appropriate affect Breasts: Deferred   LAB RESULTS:  CMP     Component Value Date/Time   NA 138 04/30/2020 0817   NA 141 03/03/2017 1358   K 3.3 (L) 04/30/2020 0817   K 3.3 (L) 03/03/2017 1358   CL 103 04/30/2020 0817   CO2 19 (L) 04/30/2020 0817   CO2 28 03/03/2017 1358   GLUCOSE 102 (H) 04/30/2020 0817   GLUCOSE 107 03/03/2017 1358   BUN 10 04/30/2020 0817   BUN 12.7 03/03/2017 1358   CREATININE 0.72 04/30/2020 0817   CREATININE 0.83 02/19/2020 0936   CREATININE 0.9 03/03/2017 1358   CALCIUM 8.1 (L) 04/30/2020 0817   CALCIUM 9.4 03/03/2017 1358   PROT 5.1 (L) 04/30/2020 0817   PROT 6.5 03/03/2017 1358   ALBUMIN 2.9 (L) 04/30/2020 0817   ALBUMIN 3.7 03/03/2017 1358   AST 101 (H) 04/30/2020 0817   AST 43 (H) 09/12/2018 1354   AST 23 03/03/2017 1358   ALT 36 04/30/2020 0817   ALT 84 (H) 09/12/2018 1354   ALT 25 03/03/2017 1358   ALKPHOS 205 (H) 04/30/2020 0817   ALKPHOS  71 03/03/2017 1358   BILITOT 1.7 (H) 04/30/2020 0817   BILITOT 0.6 09/12/2018 1354   BILITOT 0.52 03/03/2017 1358   GFRNONAA >60 04/30/2020 0817   GFRNONAA >60 09/12/2018 1354   GFRAA >60 06/12/2019 1401   GFRAA >60 09/12/2018 1354    No results found for: TOTALPROTELP, ALBUMINELP, A1GS, A2GS, BETS, BETA2SER, GAMS, MSPIKE, SPEI  No results found for: KPAFRELGTCHN, LAMBDASER, KAPLAMBRATIO  Lab Results  Component Value Date   WBC 6.0 04/30/2020   NEUTROABS 4.6 04/30/2020   HGB 9.6 (L) 04/30/2020   HCT 29.5 (L) 04/30/2020   MCV 88.1 04/30/2020   PLT 144 (L) 04/30/2020      Chemistry      Component Value Date/Time   NA 138 04/30/2020 0817   NA 141 03/03/2017 1358   K 3.3 (L) 04/30/2020 0817   K 3.3 (L) 03/03/2017 1358   CL 103 04/30/2020 0817   CO2 19 (L) 04/30/2020 0817   CO2 28 03/03/2017 1358   BUN 10 04/30/2020 0817   BUN 12.7 03/03/2017 1358   CREATININE 0.72 04/30/2020 0817   CREATININE 0.83 02/19/2020 0936   CREATININE 0.9 03/03/2017 1358      Component Value Date/Time   CALCIUM 8.1 (L) 04/30/2020 0817   CALCIUM 9.4 03/03/2017 1358   ALKPHOS 205 (H) 04/30/2020 0817   ALKPHOS 71 03/03/2017 1358   AST 101 (H) 04/30/2020 0817   AST 43 (H) 09/12/2018 1354   AST 23 03/03/2017 1358  ALT 36 04/30/2020 0817   ALT 84 (H) 09/12/2018 1354   ALT 25 03/03/2017 1358   BILITOT 1.7 (H) 04/30/2020 0817   BILITOT 0.6 09/12/2018 1354   BILITOT 0.52 03/03/2017 1358       No results found for: LABCA2  No components found for: YQNPLX214  No results for input(s): INR in the last 168 hours.  No results found for: LABCA2  Lab Results  Component Value Date   IBJ689 32 03/06/2020    No results found for: TGB919  No results found for: HNQ112  Lab Results  Component Value Date   CA2729 983.0 (H) 03/06/2020    No components found for: HGQUANT  Lab Results  Component Value Date   CEA1 744.17 (H) 03/06/2020   /  CEA ([facility])  Date Value Ref Range  Status  03/06/2020 744.17 (H) 0.00 - 5.00 ng/mL Final    Comment:    (NOTE) This test was performed using Architect's Chemiluminescent Microparticle Immunoassay. Values obtained from different assay methods cannot be used interchangeably. Please note that 5-10% of patients who smoke may see CEA levels up to 6.9 ng/mL. Performed at [facility], [address] [place], [place] [address]      No results found for: AFPTUMOR  No results found for: CHROMOGRNA  No results found for: HGBA, HGBA2QUANT, HGBFQUANT, HGBSQUAN (Hemoglobinopathy evaluation)   No results found for: LDH  Lab Results  Component Value Date   IRON 52 02/19/2020   TIBC 327 02/19/2020   IRONPCTSAT 16 02/19/2020   (Iron and TIBC)  Lab Results  Component Value Date   FERRITIN 298 (H) 02/19/2020    Urinalysis    Component Value Date/Time   COLORURINE AMBER (A) 04/20/2020 1701   APPEARANCEUR CLOUDY (A) 04/20/2020 1701   LABSPEC 1.019 04/20/2020 1701   PHURINE 5.0 04/20/2020 1701   GLUCOSEU NEGATIVE 04/20/2020 1701   GLUCOSEU NEGATIVE 11/05/2012 1559   HGBUR LARGE (A) 04/20/2020 1701   BILIRUBINUR SMALL (A) 04/20/2020 1701   BILIRUBINUR neg 08/10/2018 1529   KETONESUR 20 (A) 04/20/2020 1701   PROTEINUR 100 (A) 04/20/2020 1701   UROBILINOGEN 0.2 08/10/2018 1529   UROBILINOGEN 0.2 11/05/2012 1559   NITRITE NEGATIVE 04/20/2020 1701   LEUKOCYTESUR LARGE (A) 04/20/2020 1701    STUDIES: DG Abdomen Acute W/Chest  Result Date: 04/20/2020 CLINICAL DATA:  Vomiting cancer EXAM: DG ABDOMEN ACUTE WITH 1 VIEW CHEST COMPARISON:  CT chest 02/27/2020, PET CT 03/05/2020 FINDINGS: Single view chest demonstrates right upper extremity central venous catheter tip at the cavoatrial junction. Postsurgical changes of left chest and axilla. No focal opacity or pleural effusion. Normal cardiomediastinal silhouette. Supine and upright views of the abdomen demonstrate no free air beneath the diaphragm.  Surgical clips in the right upper quadrant. Multiple round calcifications in the left upper quadrant consistent with splenic granuloma. Calcified left lung base granuloma. Nonobstructed gas pattern. Staghorn calculus right kidney. Known skeletal metastatic disease is better seen on PET CT. IMPRESSION: Nonobstructed gas pattern. Staghorn calculus right kidney. No radiographic evidence for acute cardiopulmonary abnormality. Electronically Signed   By: Jasmine Pang M.D.   On: 04/20/2020 16:21   IR PICC PLACEMENT RIGHT >5 YRS INC IMG GUIDE  Result Date: 04/10/2020 INDICATION: Patient with history of metastatic breast cancer and poor venous access; central venous access requested for chemotherapy. EXAM: ULTRASOUND AND FLUOROSCOPIC GUIDED PICC LINE INSERTION MEDICATIONS: 1% lidocaine to skin and subcutaneous tissue ANESTHESIA/SEDATION: None FLUOROSCOPY TIME:  Fluoroscopy Time: 1 minutes 6  seconds (6 mGy). COMPLICATIONS: None immediate. TECHNIQUE: The procedure, risks, benefits, and alternatives were explained to the patient and informed written consent was obtained. A timeout was performed prior to the initiation of the procedure. The right upper extremity was prepped with chlorhexidine in a sterile fashion, and a sterile drape was applied covering the operative field. Maximum barrier sterile technique with sterile gowns and gloves were used for the procedure. A timeout was performed prior to the initiation of the procedure. Local anesthesia was provided with 1% lidocaine. Under direct ultrasound guidance, the right basilic vein was accessed with a micropuncture kit after the overlying soft tissues were anesthetized with 1% lidocaine. An ultrasound image was saved for documentation purposes. A guidewire was advanced to the level of the superior caval-atrial junction for measurement purposes and the PICC line was cut to length. A peel-away sheath was placed and a 47 cm, 5 [place], dual lumen was inserted to level of  the superior caval-atrial junction. A post procedure spot fluoroscopic was obtained. The catheter easily aspirated and flushed and was sutured in place. A dressing was placed. The patient tolerated the procedure well without immediate post procedural complication. FINDINGS: After catheter placement, the tip lies within the superior cavoatrial junction the catheter aspirates and flushes normally and is ready for immediate use. IMPRESSION: Successful ultrasound and fluoroscopic guided placement of a right basilic vein approach, 47 cm, 5 French, dual lumen PICC with tip at the superior caval-atrial junction. The PICC line is ready for immediate use. Read by: Rowe Robert, PA-C Electronically Signed   By: Jacqulynn Cadet M.D.   On: 04/10/2020 09:42   DG ESOPHAGUS W SINGLE CM (SOL OR THIN BA)  Result Date: 04/23/2020 CLINICAL DATA:  Dysphagia. EXAM: ESOPHOGRAM/BARIUM SWALLOW TECHNIQUE: Single contrast examination was performed using  thin barium. FLUOROSCOPY TIME:  Radiation Exposure Index (if provided by the fluoroscopic device): 17.9 mGy. COMPARISON:  None. FINDINGS: Exam is limited as the patient only drank a small amount of contrast. No definite mass or stricture is noted in the esophagus small sliding-type hiatal hernia is noted. No definite reflux is noted. Barium tablet could not be administered as the patient could not be placed in upright position. IMPRESSION: Small sliding-type hiatal hernia. No definite mass or stricture is noted in the esophagus. Electronically Signed   By: Marijo Conception M.D.   On: 04/23/2020 09:15     ELIGIBLE FOR AVAILABLE RESEARCH PROTOCOL: no   ASSESSMENT: 70 y.o. [place] woman status post bilateral biopsies 01/26/2017, showing  (1) in the right breast, a complex sclerosing lesion, status post lumpectomy 09/25/2017, with no malignancy identified.  (2) in the left breast, a cT2 pN1 invasive ductal carcinoma (with some lobular features but E-cadherin positive), grade  2, estrogen and progesterone receptor positive, HER-2 not amplified, with an MIB-1 of 15-20%  (a) breast MRI 02/04/2017 suggests a T3 N1 tumor  (3) started neoadjuvant cyclophosphamide/docetaxel 03/03/2017, discontinued after 1 cycle with very poor tolerance  (a) started cyclophosphamide/methotrexate/fluorouracil (CMF) 03/28/2017, repeated x7 cycles, last dose 08/01/2017  (4) status post left modified radical mastectomy 09/25/17 for a ypT5 ypN2, stage IIIA invasive ductal carcinoma, grade 2, again estrogen and progesterone receptor positive, HER-2 not amplified  (5) adjuvant radiation completed 12/21/2017  (a) capecitabine sensitization tolerated only the initial week of radiation (b) Site/dose: The patient initially received a dose of 50.4 Gy in 28 fractions to the left chest wall and left supraclavicular region. This was delivered using a 3-D conformal, 4 field technique. The  patient then received a boost to the mastectomy scar. This delivered an additional 10 Gy in 5 fractions using an en face electron field. The total dose was 60.4 Gy.  ( 6) tamoxifen started 03/09/2018, discontinued January 2022 with metastatic progression  (7) genetics testing 12/12/2017 through the Multi-Gene Panel offered by [facility] found no deleterious mutations in AIP, ALK, APC, ATM, AXIN2,BAP1,  BARD1, BLM, BMPR1A, BRCA1, BRCA2, BRIP1, CASR, CDC73, CDH1, CDK4, CDKN1B, CDKN1C, CDKN2A (p14ARF), CDKN2A (p16INK4a), CEBPA, CHEK2, CTNNA1, DICER1, DIS3L2, EGFR (c.2369C>T, p.Thr790Met variant only), EPCAM (Deletion/duplication testing only), FH, FLCN, GATA2, GPC3, GREM1 (Promoter region deletion/duplication testing only), HOXB13 (c.251G>A, p.Gly84Glu), HRAS, KIT, MAX, MEN1, MET, MITF (c.952G>A, p.Glu318Lys variant only), MLH1, MSH2, MSH3, MSH6, MUTYH, NBN, NF1, NF2, NTHL1, PALB2, PDGFRA, PHOX2B, PMS2, POLD1, POLE, POT1, PRKAR1A, PTCH1, PTEN, RAD50, RAD51C, RAD51D, RB1, RECQL4, RET, RUNX1, SDHAF2, SDHA (sequence changes only), SDHB,  SDHC, SDHD, SMAD4, SMARCA4, SMARCB1, SMARCE1, STK11, SUFU, TERC, TERT, TMEM127, TP53, TSC1, TSC2, VHL, WRN and WT1.   (8) Foundation 1 testing shows stable microsatellite status and 1 mutation/ Mb, TP53 mutations x2  (a) PD-L1 dated July 2019 negative  METASTATIC DISEASE: January 2022 (9) CT angio of the chest 02/27/2020 shows multiple hilar nodes consistent with prior granulomatous infection, multiple small pulmonary nodules, multiple punctate granulomas throughout the liver and spleen, nodular hepatic contour and areas of mottling possibly due to steatosis, 1.6 cm left adrenal nodule  (a) PET scan 03/05/2020 shows mildly to highly hypermetabolic adenopathy, the pulmonary nodules being mildly to not hypermetabolic, a large right hepatic liver lesion with an SUV of 13.2 and additional hypermetabolic right and left lobe foci, the adrenal lesion being hypermetabolic with an SUV of 16.  There are also peritoneal nodules and innumerable foci of bony involvement.  Incidental note was made of small heterogeneous foci of increased uptake in the cerebellum.  The CT images did not show gross abnormality in the visualized brain  (b) on 03/09/2020 CA 27-29 was 983.0, CEA 744.17, the CA 19-9 normal at 32  (c) liver biopsy 03/13/2020 confirms metastati carcinoma, estrogen receptor positive, progesterone and HER 2 negative, with an Mib-1 of 40%  (d) brain MRI 03/26/2020 shows dural involvement including a 1.0 cm dural-based cerebellar lesion  (10) paclitaxel started 03/30/2020, to be repeated weekly as tolerated  (11) zoledronate started 03/19/2020, to be repeated every 12 weeks  (12) foundation 1 pending on the most recent biopsy   PLAN: Amoura's situation is complex.  She understands she has stage IV incurable cancer, and she understands that she may not have very long to live.  She is not ready to "give up" and does want continuing chemotherapy so long as we have any possibility of response.  What I see as  far as I can tell is that after receiving 2 cycles of Taxol the mass in the nape of her neck and also on top of her right eyelid for example are stable.  There has not been any rapid growth of those palpable tumors.  So I do not have evidence that the Taxol is failing.  She will receive a third dose today and a fourth dose next week assuming she continues to wish to be treated at that time.  At the same time she is not eating, is barely hydrating herself, and is having increasing difficulty getting around.  She finally has contacted her sisters and tells me her sister from [place] may be coming here and staying a couple of weeks and her sister from  [place] is expected to arrive on March 13 and stay a few days.  We are placing a palliative care referral through hospice.  I am hopeful they can see her soon so the patient can get a wheelchair and a walker at home.  The patient is being transported through medical transportation.  We are flushing her PICC line 3 times a week and possibly we could get that done at home as well  I have also said to her that if she wishes she can hire someone for 5 hours a day to give her some assistance at home which I think would be a positive move for her if she is able to afford it.  Otherwise she may end up institutionalized and she tells me she absolutely does not want to go to a nursing home.  In sum, she will continue to have her PICC line flushed 3 times a week, she will continue to receive Taxol weekly including today and next week, and she will be visited by palliative section of [facility]/hospice, with review of helping her stay safely and as comfortably as possible at home.  I am making her an appointment with me on 05/12/2020 since that is the time both her sisters will be in town and hopefully we will be able to make a more definitive decision at that time regarding which way to go.  Of course I will also see her next week with her next treatment.  The patient  is not to undergo CPR or intubation as per her prior decisions.  Total encounter time 40 minutes.   Tahesha Skeet, Brooke Dad, MD  04/30/20 1:26 PM Medical Oncology and Hematology [facility] [place], [place] [address] Tel. [phone]    Fax. [phone]   I, Wilburn Mylar, am acting as scribe for Dr. Virgie [name]. Mialee Weyman.  I, Lurline Del MD, have reviewed the above documentation for accuracy and completeness, and I agree with the above.   *Total Encounter Time as defined by the Centers for Medicare and Medicaid Services includes, in addition to the face-to-face time of a patient visit (documented in the note above) non-face-to-face time: obtaining and reviewing outside history, ordering and reviewing medications, tests or procedures, care coordination (communications with other health care professionals or caregivers) and documentation in the medical record.

## 2020-04-30 ENCOUNTER — Other Ambulatory Visit: Payer: Self-pay

## 2020-04-30 ENCOUNTER — Encounter: Payer: Self-pay | Admitting: General Practice

## 2020-04-30 ENCOUNTER — Telehealth: Payer: Self-pay

## 2020-04-30 ENCOUNTER — Inpatient Hospital Stay: Payer: Medicare PPO | Admitting: Oncology

## 2020-04-30 ENCOUNTER — Inpatient Hospital Stay: Payer: Medicare PPO

## 2020-04-30 ENCOUNTER — Other Ambulatory Visit: Payer: Self-pay | Admitting: Oncology

## 2020-04-30 VITALS — BP 159/68 | HR 95 | Temp 97.7°F | Resp 18 | Ht 69.0 in | Wt 185.2 lb

## 2020-04-30 DIAGNOSIS — E538 Deficiency of other specified B group vitamins: Secondary | ICD-10-CM | POA: Diagnosis present

## 2020-04-30 DIAGNOSIS — R110 Nausea: Secondary | ICD-10-CM | POA: Diagnosis not present

## 2020-04-30 DIAGNOSIS — C787 Secondary malignant neoplasm of liver and intrahepatic bile duct: Secondary | ICD-10-CM

## 2020-04-30 DIAGNOSIS — Z170 Estrogen receptor positive status [ER+]: Secondary | ICD-10-CM

## 2020-04-30 DIAGNOSIS — E785 Hyperlipidemia, unspecified: Secondary | ICD-10-CM | POA: Diagnosis present

## 2020-04-30 DIAGNOSIS — C7951 Secondary malignant neoplasm of bone: Secondary | ICD-10-CM

## 2020-04-30 DIAGNOSIS — R1310 Dysphagia, unspecified: Secondary | ICD-10-CM | POA: Diagnosis not present

## 2020-04-30 DIAGNOSIS — D7389 Other diseases of spleen: Secondary | ICD-10-CM | POA: Diagnosis not present

## 2020-04-30 DIAGNOSIS — C50812 Malignant neoplasm of overlapping sites of left female breast: Secondary | ICD-10-CM

## 2020-04-30 DIAGNOSIS — E876 Hypokalemia: Secondary | ICD-10-CM | POA: Diagnosis present

## 2020-04-30 DIAGNOSIS — Z20822 Contact with and (suspected) exposure to covid-19: Secondary | ICD-10-CM | POA: Diagnosis present

## 2020-04-30 DIAGNOSIS — R2681 Unsteadiness on feet: Secondary | ICD-10-CM | POA: Diagnosis not present

## 2020-04-30 DIAGNOSIS — R410 Disorientation, unspecified: Secondary | ICD-10-CM | POA: Diagnosis not present

## 2020-04-30 DIAGNOSIS — I10 Essential (primary) hypertension: Secondary | ICD-10-CM | POA: Diagnosis present

## 2020-04-30 DIAGNOSIS — W19XXXA Unspecified fall, initial encounter: Secondary | ICD-10-CM | POA: Diagnosis present

## 2020-04-30 DIAGNOSIS — B952 Enterococcus as the cause of diseases classified elsewhere: Secondary | ICD-10-CM | POA: Diagnosis present

## 2020-04-30 DIAGNOSIS — E860 Dehydration: Secondary | ICD-10-CM | POA: Diagnosis not present

## 2020-04-30 DIAGNOSIS — M2550 Pain in unspecified joint: Secondary | ICD-10-CM | POA: Diagnosis not present

## 2020-04-30 DIAGNOSIS — Z66 Do not resuscitate: Secondary | ICD-10-CM | POA: Diagnosis present

## 2020-04-30 DIAGNOSIS — M6281 Muscle weakness (generalized): Secondary | ICD-10-CM | POA: Diagnosis not present

## 2020-04-30 DIAGNOSIS — Z7401 Bed confinement status: Secondary | ICD-10-CM | POA: Diagnosis not present

## 2020-04-30 DIAGNOSIS — C701 Malignant neoplasm of spinal meninges: Secondary | ICD-10-CM | POA: Diagnosis not present

## 2020-04-30 DIAGNOSIS — R339 Retention of urine, unspecified: Secondary | ICD-10-CM | POA: Diagnosis not present

## 2020-04-30 DIAGNOSIS — A1813 Tuberculosis of other urinary organs: Secondary | ICD-10-CM | POA: Diagnosis not present

## 2020-04-30 DIAGNOSIS — R531 Weakness: Secondary | ICD-10-CM | POA: Diagnosis not present

## 2020-04-30 DIAGNOSIS — C7800 Secondary malignant neoplasm of unspecified lung: Secondary | ICD-10-CM

## 2020-04-30 DIAGNOSIS — Y929 Unspecified place or not applicable: Secondary | ICD-10-CM | POA: Diagnosis not present

## 2020-04-30 DIAGNOSIS — Z452 Encounter for adjustment and management of vascular access device: Secondary | ICD-10-CM

## 2020-04-30 DIAGNOSIS — M542 Cervicalgia: Secondary | ICD-10-CM | POA: Diagnosis not present

## 2020-04-30 DIAGNOSIS — R1319 Other dysphagia: Secondary | ICD-10-CM | POA: Diagnosis not present

## 2020-04-30 DIAGNOSIS — F419 Anxiety disorder, unspecified: Secondary | ICD-10-CM | POA: Diagnosis present

## 2020-04-30 DIAGNOSIS — E559 Vitamin D deficiency, unspecified: Secondary | ICD-10-CM | POA: Diagnosis present

## 2020-04-30 DIAGNOSIS — Z800 Family history of malignant neoplasm of digestive organs: Secondary | ICD-10-CM | POA: Diagnosis not present

## 2020-04-30 DIAGNOSIS — Z6826 Body mass index (BMI) 26.0-26.9, adult: Secondary | ICD-10-CM | POA: Diagnosis not present

## 2020-04-30 DIAGNOSIS — G9389 Other specified disorders of brain: Secondary | ICD-10-CM | POA: Diagnosis not present

## 2020-04-30 DIAGNOSIS — E669 Obesity, unspecified: Secondary | ICD-10-CM | POA: Diagnosis present

## 2020-04-30 DIAGNOSIS — Z515 Encounter for palliative care: Secondary | ICD-10-CM | POA: Diagnosis not present

## 2020-04-30 DIAGNOSIS — L89314 Pressure ulcer of right buttock, stage 4: Secondary | ICD-10-CM | POA: Diagnosis not present

## 2020-04-30 DIAGNOSIS — E872 Acidosis: Secondary | ICD-10-CM | POA: Diagnosis present

## 2020-04-30 DIAGNOSIS — C7801 Secondary malignant neoplasm of right lung: Secondary | ICD-10-CM | POA: Diagnosis not present

## 2020-04-30 DIAGNOSIS — N390 Urinary tract infection, site not specified: Secondary | ICD-10-CM | POA: Diagnosis present

## 2020-04-30 DIAGNOSIS — R112 Nausea with vomiting, unspecified: Secondary | ICD-10-CM | POA: Diagnosis not present

## 2020-04-30 DIAGNOSIS — N200 Calculus of kidney: Secondary | ICD-10-CM | POA: Diagnosis not present

## 2020-04-30 DIAGNOSIS — Y92009 Unspecified place in unspecified non-institutional (private) residence as the place of occurrence of the external cause: Secondary | ICD-10-CM | POA: Diagnosis not present

## 2020-04-30 DIAGNOSIS — R220 Localized swelling, mass and lump, head: Secondary | ICD-10-CM | POA: Diagnosis not present

## 2020-04-30 DIAGNOSIS — Z741 Need for assistance with personal care: Secondary | ICD-10-CM | POA: Diagnosis not present

## 2020-04-30 DIAGNOSIS — L89324 Pressure ulcer of left buttock, stage 4: Secondary | ICD-10-CM | POA: Diagnosis not present

## 2020-04-30 DIAGNOSIS — D61818 Other pancytopenia: Secondary | ICD-10-CM | POA: Diagnosis present

## 2020-04-30 DIAGNOSIS — C7802 Secondary malignant neoplasm of left lung: Secondary | ICD-10-CM | POA: Diagnosis not present

## 2020-04-30 DIAGNOSIS — I509 Heart failure, unspecified: Secondary | ICD-10-CM | POA: Diagnosis not present

## 2020-04-30 DIAGNOSIS — R911 Solitary pulmonary nodule: Secondary | ICD-10-CM | POA: Diagnosis not present

## 2020-04-30 DIAGNOSIS — G9341 Metabolic encephalopathy: Secondary | ICD-10-CM | POA: Diagnosis present

## 2020-04-30 DIAGNOSIS — R5381 Other malaise: Secondary | ICD-10-CM | POA: Diagnosis not present

## 2020-04-30 DIAGNOSIS — C7931 Secondary malignant neoplasm of brain: Secondary | ICD-10-CM | POA: Diagnosis present

## 2020-04-30 DIAGNOSIS — R2689 Other abnormalities of gait and mobility: Secondary | ICD-10-CM | POA: Diagnosis not present

## 2020-04-30 DIAGNOSIS — R627 Adult failure to thrive: Secondary | ICD-10-CM | POA: Diagnosis not present

## 2020-04-30 DIAGNOSIS — Z7189 Other specified counseling: Secondary | ICD-10-CM | POA: Diagnosis not present

## 2020-04-30 DIAGNOSIS — Z8049 Family history of malignant neoplasm of other genital organs: Secondary | ICD-10-CM | POA: Diagnosis not present

## 2020-04-30 DIAGNOSIS — R39198 Other difficulties with micturition: Secondary | ICD-10-CM | POA: Diagnosis not present

## 2020-04-30 DIAGNOSIS — K753 Granulomatous hepatitis, not elsewhere classified: Secondary | ICD-10-CM | POA: Diagnosis not present

## 2020-04-30 DIAGNOSIS — D0361 Melanoma in situ of right upper limb, including shoulder: Secondary | ICD-10-CM

## 2020-04-30 DIAGNOSIS — E440 Moderate protein-calorie malnutrition: Secondary | ICD-10-CM | POA: Diagnosis present

## 2020-04-30 DIAGNOSIS — C50919 Malignant neoplasm of unspecified site of unspecified female breast: Secondary | ICD-10-CM | POA: Diagnosis not present

## 2020-04-30 DIAGNOSIS — Z803 Family history of malignant neoplasm of breast: Secondary | ICD-10-CM | POA: Diagnosis not present

## 2020-04-30 LAB — CBC WITH DIFFERENTIAL/PLATELET
Abs Immature Granulocytes: 0.08 10*3/uL — ABNORMAL HIGH (ref 0.00–0.07)
Basophils Absolute: 0 10*3/uL (ref 0.0–0.1)
Basophils Relative: 1 %
Eosinophils Absolute: 0 10*3/uL (ref 0.0–0.5)
Eosinophils Relative: 0 %
HCT: 29.5 % — ABNORMAL LOW (ref 36.0–46.0)
Hemoglobin: 9.6 g/dL — ABNORMAL LOW (ref 12.0–15.0)
Immature Granulocytes: 1 %
Lymphocytes Relative: 12 %
Lymphs Abs: 0.7 10*3/uL (ref 0.7–4.0)
MCH: 28.7 pg (ref 26.0–34.0)
MCHC: 32.5 g/dL (ref 30.0–36.0)
MCV: 88.1 fL (ref 80.0–100.0)
Monocytes Absolute: 0.6 10*3/uL (ref 0.1–1.0)
Monocytes Relative: 10 %
Neutro Abs: 4.6 10*3/uL (ref 1.7–7.7)
Neutrophils Relative %: 76 %
Platelets: 144 10*3/uL — ABNORMAL LOW (ref 150–400)
RBC: 3.35 MIL/uL — ABNORMAL LOW (ref 3.87–5.11)
RDW: 23.8 % — ABNORMAL HIGH (ref 11.5–15.5)
WBC: 6 10*3/uL (ref 4.0–10.5)
nRBC: 1.3 % — ABNORMAL HIGH (ref 0.0–0.2)

## 2020-04-30 LAB — URINALYSIS, COMPLETE (UACMP) WITH MICROSCOPIC
Bilirubin Urine: NEGATIVE
Glucose, UA: NEGATIVE mg/dL
Ketones, ur: 20 mg/dL — AB
Nitrite: NEGATIVE
Protein, ur: NEGATIVE mg/dL
Specific Gravity, Urine: 1.013 (ref 1.005–1.030)
WBC, UA: >50 WBC/hpf — ABNORMAL HIGH (ref 0–5)
pH: 6 (ref 5.0–8.0)

## 2020-04-30 LAB — COMPREHENSIVE METABOLIC PANEL
ALT: 36 U/L (ref 0–44)
AST: 101 U/L — ABNORMAL HIGH (ref 15–41)
Albumin: 2.9 g/dL — ABNORMAL LOW (ref 3.5–5.0)
Alkaline Phosphatase: 205 U/L — ABNORMAL HIGH (ref 38–126)
Anion gap: 16 — ABNORMAL HIGH (ref 5–15)
BUN: 10 mg/dL (ref 8–23)
CO2: 19 mmol/L — ABNORMAL LOW (ref 22–32)
Calcium: 8.1 mg/dL — ABNORMAL LOW (ref 8.9–10.3)
Chloride: 103 mmol/L (ref 98–111)
Creatinine, Ser: 0.72 mg/dL (ref 0.44–1.00)
GFR, Estimated: >60 mL/min (ref >60)
Glucose, Bld: 102 mg/dL — ABNORMAL HIGH (ref 70–99)
Potassium: 3.3 mmol/L — ABNORMAL LOW (ref 3.5–5.1)
Sodium: 138 mmol/L (ref 135–145)
Total Bilirubin: 1.7 mg/dL — ABNORMAL HIGH (ref 0.3–1.2)
Total Protein: 5.1 g/dL — ABNORMAL LOW (ref 6.5–8.1)

## 2020-04-30 MED ORDER — SODIUM CHLORIDE 0.9 % IV SOLN
10.0000 mg | Freq: Once | INTRAVENOUS | Status: AC
  Administered 2020-04-30: 10 mg via INTRAVENOUS
  Filled 2020-04-30: qty 10

## 2020-04-30 MED ORDER — DIPHENHYDRAMINE HCL 50 MG/ML IJ SOLN
25.0000 mg | Freq: Once | INTRAMUSCULAR | Status: AC
  Administered 2020-04-30: 25 mg via INTRAVENOUS

## 2020-04-30 MED ORDER — HEPARIN SOD (PORK) LOCK FLUSH 100 UNIT/ML IV SOLN
500.0000 [IU] | Freq: Once | INTRAVENOUS | Status: AC | PRN
Start: 2020-04-30 — End: 2020-04-30
  Administered 2020-04-30: 500 [IU]
  Filled 2020-04-30: qty 5

## 2020-04-30 MED ORDER — FAMOTIDINE IN NACL 20-0.9 MG/50ML-% IV SOLN
20.0000 mg | Freq: Once | INTRAVENOUS | Status: AC
  Administered 2020-04-30: 20 mg via INTRAVENOUS

## 2020-04-30 MED ORDER — DIPHENHYDRAMINE HCL 50 MG/ML IJ SOLN
INTRAMUSCULAR | Status: AC
  Filled 2020-04-30: qty 1

## 2020-04-30 MED ORDER — SODIUM CHLORIDE 0.9% FLUSH
10.0000 mL | INTRAVENOUS | Status: DC | PRN
  Administered 2020-04-30: 10 mL
  Filled 2020-04-30: qty 10

## 2020-04-30 MED ORDER — SODIUM CHLORIDE 0.9 % IV SOLN
80.0000 mg/m2 | Freq: Once | INTRAVENOUS | Status: AC
  Administered 2020-04-30: 168 mg via INTRAVENOUS
  Filled 2020-04-30: qty 28

## 2020-04-30 MED ORDER — SODIUM CHLORIDE 0.9 % IV SOLN
Freq: Once | INTRAVENOUS | Status: AC
  Filled 2020-04-30: qty 250

## 2020-04-30 MED ORDER — SODIUM CHLORIDE 0.9% FLUSH
10.0000 mL | Freq: Once | INTRAVENOUS | Status: AC
  Administered 2020-04-30: 10 mL
  Filled 2020-04-30: qty 10

## 2020-04-30 MED ORDER — FAMOTIDINE IN NACL 20-0.9 MG/50ML-% IV SOLN
INTRAVENOUS | Status: AC
  Filled 2020-04-30: qty 50

## 2020-04-30 MED ORDER — CIPROFLOXACIN HCL 500 MG PO TABS: 500.0000 mg | ORAL_TABLET | Freq: Two times a day (BID) | ORAL | 0 refills | Status: DC

## 2020-04-30 NOTE — Progress Notes (Signed)
Okay to treat with Tbilli of 1.7 per Dr. [name]

## 2020-04-30 NOTE — Telephone Encounter (Signed)
Verified with pt's sister that Palliative care will be seeing pt.

## 2020-04-30 NOTE — Progress Notes (Signed)
[facility] Spiritual Care Note  Followed up with Brooke [name] in infusion. She wasn't up to a visit, but welcomes support phone calls. We plan for me to call her next week.   [place], [place], [facility] Pager [phone] Voicemail [phone]

## 2020-04-30 NOTE — Progress Notes (Signed)
Brooke [name]'s urinalysis indeed does suggest infection I have called Cipro for her to get to the pharmacy.  I called her and left her voicemail letting her know.

## 2020-05-01 ENCOUNTER — Telehealth: Payer: Self-pay | Admitting: Oncology

## 2020-05-01 ENCOUNTER — Other Ambulatory Visit: Payer: Medicare PPO | Admitting: Student

## 2020-05-01 ENCOUNTER — Inpatient Hospital Stay: Payer: Medicare PPO

## 2020-05-01 DIAGNOSIS — C50812 Malignant neoplasm of overlapping sites of left female breast: Secondary | ICD-10-CM

## 2020-05-01 DIAGNOSIS — Z170 Estrogen receptor positive status [ER+]: Secondary | ICD-10-CM

## 2020-05-01 DIAGNOSIS — Z95828 Presence of other vascular implants and grafts: Secondary | ICD-10-CM

## 2020-05-01 DIAGNOSIS — Z452 Encounter for adjustment and management of vascular access device: Secondary | ICD-10-CM

## 2020-05-01 MED ORDER — SODIUM CHLORIDE 0.9% FLUSH
10.0000 mL | Freq: Once | INTRAVENOUS | Status: AC
  Administered 2020-05-01: 10 mL
  Filled 2020-05-01: qty 10

## 2020-05-01 MED ORDER — HEPARIN SOD (PORK) LOCK FLUSH 100 UNIT/ML IV SOLN
250.0000 [IU] | Freq: Once | INTRAVENOUS | Status: AC
  Administered 2020-05-01: 250 [IU]
  Filled 2020-05-01: qty 5

## 2020-05-01 NOTE — Patient Instructions (Signed)
PICC Home Care Guide A peripherally inserted central catheter (PICC) is a form of IV access that allows medicines and IV fluids to be quickly distributed throughout the body. The PICC is a long, thin, flexible tube (catheter) that is inserted into a vein in the upper arm. The catheter ends in a large vein in the chest (superior vena cava, or SVC). After the PICC is inserted, a chest X-ray may be done to make sure that it is in the correct place. A PICC may be placed for different reasons, such as:  To give medicines and liquid nutrition.  To give IV fluids and blood products.  If there is trouble placing a peripheral intravenous (PIV) catheter. If taken care of properly, a PICC can remain in place for several months. Having a PICC can also allow a person to go home from the hospital sooner. Medicine and PICC care can be managed at home by a family member, caregiver, or home health care team. What are the risks? Generally, having a PICC is safe. However, problems may occur, including:  A blood clot (thrombus) forming in or at the tip of the PICC.  A blood clot forming in a vein (deep vein thrombosis) or traveling to the lung (pulmonary embolism).  Inflammation of the vein (phlebitis) in which the PICC is placed.  Infection. Central line associated blood stream infection (CLABSI) is a serious infection that often requires hospitalization.  PICC movement (malposition). The PICC tip may move from its original position due to excessive physical activity, forceful coughing, sneezing, or vomiting.  A break or cut in the PICC. It is important not to use scissors near the PICC.  Nerve or tendon irritation or injury during PICC insertion. How to take care of your PICC Preventing problems  You and any caregivers should wash your hands often with soap. Wash hands: ? Before touching the PICC line or the infusion device. ? Before changing a bandage (dressing).  Flush the PICC as told by your  health care provider. Let your health care provider know right away if the PICC is hard to flush or does not flush. Do not use force to flush the PICC.  Do not use a syringe that is less than 10 mL to flush the PICC.  Avoid blood pressure checks on the arm in which the PICC is placed.  Never pull or tug on the PICC.  Do not take the PICC out yourself. Only a trained clinical professional should remove the PICC.  Use clean and sterile supplies only. Keep the supplies in a dry place. Do not reuse needles, syringes, or any other supplies. Doing that can lead to infection.  Keep pets and children away from your PICC line.  Check the PICC insertion site every day for signs of infection. Check for: ? Leakage. ? Redness, swelling, or pain. ? Fluid or blood. ? Warmth. ? Pus or a bad smell. PICC dressing care  Keep your PICC bandage (dressing) clean and dry to prevent infection.  Do not take baths, swim, or use a hot tub until your health care provider approves. Ask your health care provider if you can take showers. You may only be allowed to take sponge baths for bathing. When you are allowed to shower: ? Ask your health care provider to teach you how to wrap the PICC line. ? Cover the PICC line with clear plastic wrap and tape to keep it dry while showering.  Follow instructions from your health care provider about   how to take care of your insertion site and dressing. Make sure you: ? Wash your hands with soap and water before you change your bandage (dressing). If soap and water are not available, use hand sanitizer. ? Change your dressing as told by your health care provider. ? Leave stitches (sutures), skin glue, or adhesive strips in place. These skin closures may need to stay in place for 2 weeks or longer. If adhesive strip edges start to loosen and curl up, you may trim the loose edges. Do not remove adhesive strips completely unless your health care provider tells you to do  that.  Change your PICC dressing if it becomes loose or wet. General instructions  Carry your PICC identification card or wear a medical alert bracelet at all times.  Keep the tube clamped at all times, unless it is being used.  Carry a smooth-edge clamp with you at all times to place on the tube if it breaks.  Do not use scissors or sharp objects near the tube.  You may bend your arm and move it freely. If your PICC is near or at the bend of your elbow, avoid activity with repeated motion at the elbow.  Avoid lifting heavy objects as told by your health care provider.  Keep all follow-up visits as told by your health care provider. This is important.   Disposal of supplies  Throw away any syringes in a disposal container that is meant for sharp items (sharps container). You can buy a sharps container from a pharmacy, or you can make one by using an empty hard plastic bottle with a cover.  Place any used dressings or infusion bags into a plastic bag. Throw that bag in the trash. Contact a health care provider if:  You have pain in your arm, ear, face, or teeth.  You have a fever or chills.  You have redness, swelling, or pain around the insertion site.  You have fluid or blood coming from the insertion site.  Your insertion site feels warm to the touch.  You have pus or a bad smell coming from the insertion site.  Your skin feels hard and raised around the insertion site. Get help right away if:  Your PICC is accidentally pulled all the way out. If this happens, cover the insertion site with a bandage or gauze dressing. Do not throw the PICC away. Your health care provider will need to check it.  Your PICC was tugged or pulled and has partially come out. Do not  push the PICC back in.  You cannot flush the PICC, it is hard to flush, or the PICC leaks around the insertion site when it is flushed.  You hear a "flushing" sound when the PICC is flushed.  You feel your  heart racing or skipping beats.  There is a hole or tear in the PICC.  You have swelling in the arm in which the PICC was inserted.  You have a red streak going up your arm from where the PICC was inserted. Summary  A peripherally inserted central catheter (PICC) is a long, thin, flexible tube (catheter) that is inserted into a vein in the upper arm.  The PICC is inserted using a sterile technique by a specially trained nurse or physician. Only a trained clinical professional should remove it.  Keep your PICC identification card with you at all times.  Avoid blood pressure checks on the arm in which the PICC is placed.  If cared for   properly, a PICC can remain in place for several months. Having a PICC can also allow a person to go home from the hospital sooner. This information is not intended to replace advice given to you by your health care provider. Make sure you discuss any questions you have with your health care provider. Document Revised: 06/26/2019 Document Reviewed: 06/26/2019 Elsevier Patient Education  2021 [employer]  

## 2020-05-01 NOTE — Telephone Encounter (Signed)
Scheduled appts per 3/3 los. Called pt to review appts,  Pt to get updated appt calendar at 3/4 appt per pt's request.

## 2020-05-02 ENCOUNTER — Emergency Department (HOSPITAL_COMMUNITY): Payer: Medicare PPO

## 2020-05-02 ENCOUNTER — Inpatient Hospital Stay (HOSPITAL_COMMUNITY)
Admission: EM | Admit: 2020-05-02 | Discharge: 2020-05-12 | DRG: 640 | Disposition: A | Discharge status: Skilled Nursing Facility | Payer: Medicare PPO | Attending: Internal Medicine | Admitting: Internal Medicine

## 2020-05-02 ENCOUNTER — Encounter (HOSPITAL_COMMUNITY): Payer: Self-pay

## 2020-05-02 ENCOUNTER — Other Ambulatory Visit: Payer: Self-pay

## 2020-05-02 ENCOUNTER — Observation Stay (HOSPITAL_COMMUNITY): Payer: Medicare PPO

## 2020-05-02 DIAGNOSIS — D6959 Other secondary thrombocytopenia: Secondary | ICD-10-CM | POA: Diagnosis present

## 2020-05-02 DIAGNOSIS — N200 Calculus of kidney: Secondary | ICD-10-CM | POA: Diagnosis not present

## 2020-05-02 DIAGNOSIS — E876 Hypokalemia: Secondary | ICD-10-CM

## 2020-05-02 DIAGNOSIS — W19XXXA Unspecified fall, initial encounter: Secondary | ICD-10-CM | POA: Diagnosis present

## 2020-05-02 DIAGNOSIS — Z6826 Body mass index (BMI) 26.0-26.9, adult: Secondary | ICD-10-CM

## 2020-05-02 DIAGNOSIS — K753 Granulomatous hepatitis, not elsewhere classified: Secondary | ICD-10-CM | POA: Diagnosis not present

## 2020-05-02 DIAGNOSIS — Z170 Estrogen receptor positive status [ER+]: Secondary | ICD-10-CM

## 2020-05-02 DIAGNOSIS — Z66 Do not resuscitate: Secondary | ICD-10-CM | POA: Diagnosis present

## 2020-05-02 DIAGNOSIS — L89319 Pressure ulcer of right buttock, unspecified stage: Secondary | ICD-10-CM | POA: Clinically undetermined

## 2020-05-02 DIAGNOSIS — Z79899 Other long term (current) drug therapy: Secondary | ICD-10-CM

## 2020-05-02 DIAGNOSIS — L8990 Pressure ulcer of unspecified site, unspecified stage: Secondary | ICD-10-CM | POA: Insufficient documentation

## 2020-05-02 DIAGNOSIS — B952 Enterococcus as the cause of diseases classified elsewhere: Secondary | ICD-10-CM | POA: Diagnosis present

## 2020-05-02 DIAGNOSIS — C787 Secondary malignant neoplasm of liver and intrahepatic bile duct: Secondary | ICD-10-CM | POA: Diagnosis present

## 2020-05-02 DIAGNOSIS — K449 Diaphragmatic hernia without obstruction or gangrene: Secondary | ICD-10-CM | POA: Diagnosis present

## 2020-05-02 DIAGNOSIS — E440 Moderate protein-calorie malnutrition: Secondary | ICD-10-CM | POA: Diagnosis present

## 2020-05-02 DIAGNOSIS — R197 Diarrhea, unspecified: Secondary | ICD-10-CM

## 2020-05-02 DIAGNOSIS — G9341 Metabolic encephalopathy: Secondary | ICD-10-CM | POA: Diagnosis present

## 2020-05-02 DIAGNOSIS — L89322 Pressure ulcer of left buttock, stage 2: Secondary | ICD-10-CM | POA: Clinically undetermined

## 2020-05-02 DIAGNOSIS — D61818 Other pancytopenia: Secondary | ICD-10-CM | POA: Diagnosis present

## 2020-05-02 DIAGNOSIS — E785 Hyperlipidemia, unspecified: Secondary | ICD-10-CM | POA: Diagnosis present

## 2020-05-02 DIAGNOSIS — R112 Nausea with vomiting, unspecified: Secondary | ICD-10-CM | POA: Diagnosis present

## 2020-05-02 DIAGNOSIS — Y92009 Unspecified place in unspecified non-institutional (private) residence as the place of occurrence of the external cause: Secondary | ICD-10-CM

## 2020-05-02 DIAGNOSIS — E872 Acidosis, unspecified: Secondary | ICD-10-CM

## 2020-05-02 DIAGNOSIS — E860 Dehydration: Principal | ICD-10-CM | POA: Diagnosis present

## 2020-05-02 DIAGNOSIS — Z8261 Family history of arthritis: Secondary | ICD-10-CM

## 2020-05-02 DIAGNOSIS — Z8049 Family history of malignant neoplasm of other genital organs: Secondary | ICD-10-CM

## 2020-05-02 DIAGNOSIS — C7951 Secondary malignant neoplasm of bone: Secondary | ICD-10-CM | POA: Diagnosis present

## 2020-05-02 DIAGNOSIS — C7802 Secondary malignant neoplasm of left lung: Secondary | ICD-10-CM

## 2020-05-02 DIAGNOSIS — Z8249 Family history of ischemic heart disease and other diseases of the circulatory system: Secondary | ICD-10-CM

## 2020-05-02 DIAGNOSIS — E669 Obesity, unspecified: Secondary | ICD-10-CM | POA: Diagnosis present

## 2020-05-02 DIAGNOSIS — Z808 Family history of malignant neoplasm of other organs or systems: Secondary | ICD-10-CM

## 2020-05-02 DIAGNOSIS — F418 Other specified anxiety disorders: Secondary | ICD-10-CM | POA: Diagnosis present

## 2020-05-02 DIAGNOSIS — L22 Diaper dermatitis: Secondary | ICD-10-CM | POA: Diagnosis present

## 2020-05-02 DIAGNOSIS — Z833 Family history of diabetes mellitus: Secondary | ICD-10-CM

## 2020-05-02 DIAGNOSIS — E8809 Other disorders of plasma-protein metabolism, not elsewhere classified: Secondary | ICD-10-CM | POA: Diagnosis present

## 2020-05-02 DIAGNOSIS — Z888 Allergy status to other drugs, medicaments and biological substances status: Secondary | ICD-10-CM

## 2020-05-02 DIAGNOSIS — C7800 Secondary malignant neoplasm of unspecified lung: Secondary | ICD-10-CM | POA: Diagnosis present

## 2020-05-02 DIAGNOSIS — T451X5A Adverse effect of antineoplastic and immunosuppressive drugs, initial encounter: Secondary | ICD-10-CM | POA: Diagnosis present

## 2020-05-02 DIAGNOSIS — N390 Urinary tract infection, site not specified: Secondary | ICD-10-CM | POA: Diagnosis present

## 2020-05-02 DIAGNOSIS — D6481 Anemia due to antineoplastic chemotherapy: Secondary | ICD-10-CM | POA: Diagnosis present

## 2020-05-02 DIAGNOSIS — Z9012 Acquired absence of left breast and nipple: Secondary | ICD-10-CM

## 2020-05-02 DIAGNOSIS — R633 Feeding difficulties, unspecified: Secondary | ICD-10-CM | POA: Diagnosis present

## 2020-05-02 DIAGNOSIS — R627 Adult failure to thrive: Secondary | ICD-10-CM

## 2020-05-02 DIAGNOSIS — F419 Anxiety disorder, unspecified: Secondary | ICD-10-CM | POA: Diagnosis present

## 2020-05-02 DIAGNOSIS — C50812 Malignant neoplasm of overlapping sites of left female breast: Secondary | ICD-10-CM

## 2020-05-02 DIAGNOSIS — Z91040 Latex allergy status: Secondary | ICD-10-CM

## 2020-05-02 DIAGNOSIS — Z800 Family history of malignant neoplasm of digestive organs: Secondary | ICD-10-CM

## 2020-05-02 DIAGNOSIS — I10 Essential (primary) hypertension: Secondary | ICD-10-CM | POA: Diagnosis present

## 2020-05-02 DIAGNOSIS — C7801 Secondary malignant neoplasm of right lung: Secondary | ICD-10-CM

## 2020-05-02 DIAGNOSIS — D638 Anemia in other chronic diseases classified elsewhere: Secondary | ICD-10-CM | POA: Diagnosis present

## 2020-05-02 DIAGNOSIS — Z95828 Presence of other vascular implants and grafts: Secondary | ICD-10-CM

## 2020-05-02 DIAGNOSIS — Z20822 Contact with and (suspected) exposure to covid-19: Secondary | ICD-10-CM | POA: Diagnosis present

## 2020-05-02 DIAGNOSIS — E538 Deficiency of other specified B group vitamins: Secondary | ICD-10-CM | POA: Diagnosis present

## 2020-05-02 DIAGNOSIS — Z803 Family history of malignant neoplasm of breast: Secondary | ICD-10-CM

## 2020-05-02 DIAGNOSIS — E559 Vitamin D deficiency, unspecified: Secondary | ICD-10-CM | POA: Diagnosis present

## 2020-05-02 DIAGNOSIS — Y929 Unspecified place or not applicable: Secondary | ICD-10-CM

## 2020-05-02 DIAGNOSIS — Z85820 Personal history of malignant melanoma of skin: Secondary | ICD-10-CM

## 2020-05-02 DIAGNOSIS — C7931 Secondary malignant neoplasm of brain: Secondary | ICD-10-CM | POA: Diagnosis present

## 2020-05-02 DIAGNOSIS — R630 Anorexia: Secondary | ICD-10-CM | POA: Diagnosis present

## 2020-05-02 DIAGNOSIS — R54 Age-related physical debility: Secondary | ICD-10-CM | POA: Diagnosis present

## 2020-05-02 DIAGNOSIS — D7389 Other diseases of spleen: Secondary | ICD-10-CM | POA: Diagnosis not present

## 2020-05-02 LAB — MAGNESIUM: Magnesium: 2.1 mg/dL (ref 1.7–2.4)

## 2020-05-02 LAB — URINE CULTURE: Culture: 20000 — AB

## 2020-05-02 LAB — COMPREHENSIVE METABOLIC PANEL
ALT: 43 U/L (ref 0–44)
AST: 129 U/L — ABNORMAL HIGH (ref 15–41)
Albumin: 3.1 g/dL — ABNORMAL LOW (ref 3.5–5.0)
Alkaline Phosphatase: 194 U/L — ABNORMAL HIGH (ref 38–126)
Anion gap: 16 — ABNORMAL HIGH (ref 5–15)
BUN: 13 mg/dL (ref 8–23)
CO2: 20 mmol/L — ABNORMAL LOW (ref 22–32)
Calcium: 7.9 mg/dL — ABNORMAL LOW (ref 8.9–10.3)
Chloride: 103 mmol/L (ref 98–111)
Creatinine, Ser: 0.78 mg/dL (ref 0.44–1.00)
GFR, Estimated: >60 mL/min (ref >60)
Glucose, Bld: 103 mg/dL — ABNORMAL HIGH (ref 70–99)
Potassium: 2.9 mmol/L — ABNORMAL LOW (ref 3.5–5.1)
Sodium: 139 mmol/L (ref 135–145)
Total Bilirubin: 2.6 mg/dL — ABNORMAL HIGH (ref 0.3–1.2)
Total Protein: 5.4 g/dL — ABNORMAL LOW (ref 6.5–8.1)

## 2020-05-02 LAB — CBC
HCT: 28.8 % — ABNORMAL LOW (ref 36.0–46.0)
Hemoglobin: 9.3 g/dL — ABNORMAL LOW (ref 12.0–15.0)
MCH: 29.1 pg (ref 26.0–34.0)
MCHC: 32.3 g/dL (ref 30.0–36.0)
MCV: 90 fL (ref 80.0–100.0)
Platelets: 129 10*3/uL — ABNORMAL LOW (ref 150–400)
RBC: 3.2 MIL/uL — ABNORMAL LOW (ref 3.87–5.11)
RDW: 23.9 % — ABNORMAL HIGH (ref 11.5–15.5)
WBC: 4.6 10*3/uL (ref 4.0–10.5)
nRBC: 1.5 % — ABNORMAL HIGH (ref 0.0–0.2)

## 2020-05-02 LAB — LACTIC ACID, PLASMA: Lactic Acid, Venous: 4.2 mmol/L (ref 0.5–1.9)

## 2020-05-02 LAB — PHOSPHORUS: Phosphorus: 3 mg/dL (ref 2.5–4.6)

## 2020-05-02 LAB — LIPASE, BLOOD: Lipase: 69 U/L — ABNORMAL HIGH (ref 11–51)

## 2020-05-02 MED ORDER — METOCLOPRAMIDE HCL 5 MG/ML IJ SOLN
10.0000 mg | Freq: Three times a day (TID) | INTRAMUSCULAR | Status: DC
  Administered 2020-05-02 – 2020-05-04 (×5): 10 mg via INTRAVENOUS
  Filled 2020-05-02 (×5): qty 2

## 2020-05-02 MED ORDER — POTASSIUM CHLORIDE 10 MEQ/100ML IV SOLN
10.0000 meq | INTRAVENOUS | Status: AC
  Administered 2020-05-02 – 2020-05-03 (×4): 10 meq via INTRAVENOUS
  Filled 2020-05-02 (×4): qty 100

## 2020-05-02 MED ORDER — LOSARTAN POTASSIUM 25 MG PO TABS
12.5000 mg | ORAL_TABLET | Freq: Every day | ORAL | Status: DC
  Administered 2020-05-03 – 2020-05-12 (×9): 12.5 mg via ORAL
  Filled 2020-05-02 (×11): qty 0.5

## 2020-05-02 MED ORDER — LACTATED RINGERS IV BOLUS
1000.0000 mL | Freq: Once | INTRAVENOUS | Status: AC
  Administered 2020-05-02: 1000 mL via INTRAVENOUS

## 2020-05-02 MED ORDER — PANTOPRAZOLE SODIUM 40 MG IV SOLR
40.0000 mg | INTRAVENOUS | Status: DC
  Administered 2020-05-02 – 2020-05-09 (×8): 40 mg via INTRAVENOUS
  Filled 2020-05-02 (×8): qty 40

## 2020-05-02 MED ORDER — ACETAMINOPHEN 650 MG RE SUPP: 650.0000 mg | Freq: Four times a day (QID) | RECTAL | Status: DC | PRN

## 2020-05-02 MED ORDER — HYDRALAZINE HCL 20 MG/ML IJ SOLN: 10.0000 mg | Freq: Three times a day (TID) | INTRAMUSCULAR | Status: DC | PRN

## 2020-05-02 MED ORDER — SODIUM CHLORIDE 0.9 % IV SOLN
1.0000 g | Freq: Four times a day (QID) | INTRAVENOUS | Status: DC
  Administered 2020-05-02 – 2020-05-06 (×15): 1 g via INTRAVENOUS
  Filled 2020-05-02 (×2): qty 1
  Filled 2020-05-02: qty 1000
  Filled 2020-05-02 (×11): qty 1
  Filled 2020-05-02: qty 1000
  Filled 2020-05-02: qty 1

## 2020-05-02 MED ORDER — POTASSIUM CHLORIDE 10 MEQ/100ML IV SOLN
10.0000 meq | INTRAVENOUS | Status: AC
  Administered 2020-05-02 (×2): 10 meq via INTRAVENOUS
  Filled 2020-05-02 (×2): qty 100

## 2020-05-02 MED ORDER — ACETAMINOPHEN 325 MG PO TABS: 650.0000 mg | ORAL_TABLET | Freq: Four times a day (QID) | ORAL | Status: DC | PRN

## 2020-05-02 MED ORDER — SODIUM CHLORIDE 0.9 % IV SOLN
INTRAVENOUS | Status: DC
  Administered 2020-05-03: 1000 mL via INTRAVENOUS

## 2020-05-02 MED ORDER — LORAZEPAM 0.5 MG PO TABS
0.5000 mg | ORAL_TABLET | Freq: Three times a day (TID) | ORAL | Status: DC | PRN
  Filled 2020-05-02: qty 1

## 2020-05-02 NOTE — ED Notes (Signed)
Called report to [place], RN

## 2020-05-02 NOTE — H&P (Signed)
History and Physical    BLAYKE PINERA PPH:[id number] DOB: 04-Dec-1950 DOA: 05/02/2020  PCP: [name], MD  Patient coming from: Home  Chief Complaint: N/V, weakness  HPI: Brooke [name] is a 70 y.o. female with medical history significant of metastatic breast cancer, HTN, anxiety. Presenting with intractable N/V and generalized weakness. Admitted for similar presentation 2 weeks ago. As then, she has had N/V and poor appetite for months. She has continue chemotherapy with Dr. [name]. She had a session this week per her report. She continues to do poorly overall. She reports that she went for a PICC care session yesterday at the cancer center. After she got home, she rested. However, is was unable to get up from her chair. She was stuck there until someone came to help her this morning. They noted that she was extremely weak. She was concerned and came back to the ED.   Of note, she was diagnosed with a UTI by her oncologist a couple of days ago. An abx Rx was called in but she was unable to pick it up.    ED Course: She was found to be nauseous. CTH was negative for any new changes. She was afebrile. Her lab work was about stable from previous encounters. She was hypokalemic. She was given potassium and fluids. TRH was called for admission.   Review of Systems: Review of systems is otherwise negative for all not mentioned in HPI.   PMHx Past Medical History:  Diagnosis Date  . Anemia   . Anxiety   . Arthritis   . ASCUS of cervix with negative high risk HPV 12/2018  . Bilateral dry eyes   . Breast cancer, left breast ([place])   . Family history of breast cancer 02/02/2017  . Family history of breast cancer   . Family history of colon cancer 02/02/2017  . Family history of colon cancer   . Family history of prostate cancer   . Family history of prostate cancer in father 02/02/2017  . Family history of uterine cancer   . Fibroid   . History of kidney stones   . Hyperlipidemia    . Hypertension   . Melanoma of upper arm ([place]) 2009   RIGHT ARM   . Obesity (BMI 30.0-34.9)   . Osteopenia 09/2017   T score -1.9 max 8% / 0.8% statistically significant decline at right and left hip stable at the spine  . PONV (postoperative nausea and vomiting)   . Squamous carcinoma     of the skin  . UTI (urinary tract infection)   . Varicose veins of both lower extremities   . Vitamin D deficiency 07/2009   LOW VITAMIN D 29  . Vitamin deficiency 07/2008   VITAMIN D LOW 23    PSHx Past Surgical History:  Procedure Laterality Date  . ABDOMINAL SURGERY  1975   ovairan cyst   . APPENDECTOMY    . BREAST LUMPECTOMY WITH RADIOACTIVE SEED LOCALIZATION Right 09/25/2017   Procedure: RIGHT BREAST LUMPECTOMY WITH RADIOACTIVE SEED LOCALIZATION;  Surgeon: Jovita Kussmaul, MD;  Location: [place];  Service: General;  Laterality: Right;  . BREAST SURGERY     bio  . CHOLECYSTECTOMY    . DERMOID CYST  EXCISION    . DILATATION & CURETTAGE/HYSTEROSCOPY WITH MYOSURE N/A 04/23/2019   Procedure: DILATATION & CURETTAGE/HYSTEROSCOPY WITH MYOSURE;  Surgeon: [name], MD;  Location: [facility];  Service: Gynecology;  Laterality: N/A;  request to follow at 9:30am  in [place] block requests 45 minutes  . DILATION AND CURETTAGE OF UTERUS  1998  . GALLBLADDER SURGERY    . HYSTEROSCOPY    . insertion  port a cath  03/02/2016  . MASTECTOMY MODIFIED RADICAL Left 09/25/2017   Procedure: LEFT MODIFIED RADICAL MASTECTOMY;  Surgeon: Jovita Kussmaul, MD;  Location: [place] [facility];  Service: General;  Laterality: Left;  . PORTACATH PLACEMENT N/A 03/02/2017   Procedure: INSERTION PORT-A-CATH;  Surgeon: Jovita Kussmaul, MD;  Location: [facility];  Service: General;  Laterality: N/A;  . SKIN BIOPSY    . SKIN CANCER EXCISION  2010,2011   2010-RIGHT ARM, 2011-LEFT LOWER LEG.-DR Rolm Bookbinder DERM. DR. Sarajane Jews IS HER DERM SURGEON.    SocHx  reports that she has never smoked. She has never used smokeless  tobacco. She reports that she does not drink alcohol and does not use drugs.  Allergies  Allergen Reactions  . Neosporin [Neomycin-Bacitracin Zn-Polymyx] Swelling, Rash and Other (See Comments)    Blisters  . Bactrim [Sulfamethoxazole-Trimethoprim]     Made her feel very sick  . Benzalkonium Chloride Other (See Comments)    Redness (Pt is unaware)  It works by killing microorganisms and inhibiting their future growth, and for this reason frequently appears as an ingredient in antibacterial hand wipes, antiseptic creams and anti-itch ointments.  . Latex Rash    FamHx Family History  Problem Relation Age of Onset  . Diabetes Father   . Hypertension Father   . Heart disease Father   . Prostate cancer Father 43  . Arthritis Father   . Breast cancer Sister 67       again at age 6, GT results unk  . Skin cancer Sister   . Colon cancer Paternal Grandfather 8  . Hypertension Mother   . Heart failure Mother   . Uterine cancer Sister 57       stage IV  . Breast cancer Cousin        mat first cousin dx under 65    Prior to Admission medications   Medication Sig Start Date End Date Taking? Authorizing Provider  ciprofloxacin (CIPRO) 500 MG tablet Take 1 tablet (500 mg total) by mouth 2 (two) times daily. 04/30/20   Magrinat, Virgie Dad, MD  LORazepam (ATIVAN) 0.5 MG tablet Place 1 tablet (0.5 mg total) under the tongue every 8 (eight) hours as needed for anxiety. PT to use for nausea as needed 04/06/20   Magrinat, Virgie Dad, MD  losartan (COZAAR) 25 MG tablet Take 0.5 tablets (12.5 mg total) by mouth daily. 04/24/20   Nita Sells, MD  metoCLOPramide (REGLAN) 10 MG tablet Take 1 tablet (10 mg total) by mouth every 8 (eight) hours as needed for nausea. 04/24/20   Nita Sells, MD  ondansetron (ZOFRAN-ODT) 4 MG disintegrating tablet Take 1 tablet (4 mg total) by mouth every 8 (eight) hours as needed for nausea or vomiting. 04/06/20   Magrinat, Virgie Dad, MD  pantoprazole (PROTONIX)  40 MG tablet Take 1 tablet (40 mg total) by mouth daily. 04/25/20   Nita Sells, MD  potassium chloride (KLOR-CON) 10 MEQ tablet Take 4 tablets (40 mEq total) by mouth daily for 4 days. 04/25/20 04/29/20  Nita Sells, MD  prochlorperazine (COMPAZINE) 10 MG tablet Take 1 tablet (10 mg total) by mouth every 6 (six) hours as needed (Nausea or vomiting). 03/24/20   Magrinat, Virgie Dad, MD    Physical Exam: Vitals:   05/02/20 1408 05/02/20 1410 05/02/20 1515 05/02/20  1615  BP: (!) 148/67  (!) 148/124 107/79  Pulse: (!) 101  98 95  Resp: 17  20 16   Temp: 98.4 F (36.9 C)     TempSrc: Oral     SpO2: 97%  98% 97%  Weight:  82.6 kg    Height:  5\' 9"  (1.753 m)      General: 70 y.o. ill appearing female resting in bed Eyes: PERRL, normal sclera ENMT: Nares patent w/o discharge, orophaynx clear, dentition normal, ears w/o discharge/lesions/ulcers Neck: Supple, trachea midline Cardiovascular: RRR, +S1, S2, no m/g/r, equal pulses throughout Respiratory: CTABL, no w/r/r, normal WOB GI: BS+, ND, TTP epigastric area, no masses noted, no organomegaly noted MSK: No c/c; chronic 4+ BLE edema Skin: No rashes, bruises, ulcerations noted Neuro: A&O x 3, no focal deficits Psyc: Appropriate interaction and flat affect, calm/cooperative  Labs on Admission: I have personally reviewed following labs and imaging studies  CBC: Recent Labs  Lab 04/30/20 0817 05/02/20 1524  WBC 6.0 4.6  NEUTROABS 4.6  --   HGB 9.6* 9.3*  HCT 29.5* 28.8*  MCV 88.1 90.0  PLT 144* 622*   Basic Metabolic Panel: Recent Labs  Lab 04/30/20 0817 05/02/20 1524  NA 138 139  K 3.3* 2.9*  CL 103 103  CO2 19* 20*  GLUCOSE 102* 103*  BUN 10 13  CREATININE 0.72 0.78  CALCIUM 8.1* 7.9*  MG  --  2.1  PHOS  --  3.0   GFR: Estimated Creatinine Clearance: 76.3 mL/min (by C-G formula based on SCr of 0.78 mg/dL). Liver Function Tests: Recent Labs  Lab 04/30/20 0817 05/02/20 1524  AST 101* 129*  ALT 36 43   ALKPHOS 205* 194*  BILITOT 1.7* 2.6*  PROT 5.1* 5.4*  ALBUMIN 2.9* 3.1*   Recent Labs  Lab 05/02/20 1524  LIPASE 69*   No results for input(s): AMMONIA in the last 168 hours. Coagulation Profile: No results for input(s): INR, PROTIME in the last 168 hours. Cardiac Enzymes: No results for input(s): CKTOTAL, CKMB, CKMBINDEX, TROPONINI in the last 168 hours. BNP (last 3 results) No results for input(s): PROBNP in the last 8760 hours. HbA1C: No results for input(s): HGBA1C in the last 72 hours. CBG: No results for input(s): GLUCAP in the last 168 hours. Lipid Profile: No results for input(s): CHOL, HDL, LDLCALC, TRIG, CHOLHDL, LDLDIRECT in the last 72 hours. Thyroid Function Tests: No results for input(s): TSH, T4TOTAL, FREET4, T3FREE, THYROIDAB in the last 72 hours. Anemia Panel: No results for input(s): VITAMINB12, FOLATE, FERRITIN, TIBC, IRON, RETICCTPCT in the last 72 hours. Urine analysis:    Component Value Date/Time   COLORURINE YELLOW 04/30/2020 1300   APPEARANCEUR CLEAR 04/30/2020 1300   LABSPEC 1.013 04/30/2020 1300   PHURINE 6.0 04/30/2020 1300   GLUCOSEU NEGATIVE 04/30/2020 1300   GLUCOSEU NEGATIVE 11/05/2012 1559   HGBUR MODERATE (A) 04/30/2020 1300   BILIRUBINUR NEGATIVE 04/30/2020 1300   BILIRUBINUR neg 08/10/2018 1529   KETONESUR 20 (A) 04/30/2020 1300   PROTEINUR NEGATIVE 04/30/2020 1300   UROBILINOGEN 0.2 08/10/2018 1529   UROBILINOGEN 0.2 11/05/2012 1559   NITRITE NEGATIVE 04/30/2020 1300   LEUKOCYTESUR MODERATE (A) 04/30/2020 1300    Radiological Exams on Admission: CT Head Wo Contrast  Result Date: 05/02/2020 CLINICAL DATA:  70 year old female with metastatic breast cancer. Evaluate for brain metastases. EXAM: CT HEAD WITHOUT CONTRAST TECHNIQUE: Contiguous axial images were obtained from the base of the skull through the vertex without intravenous contrast. COMPARISON:  03/26/2020 brain MR FINDINGS:  Brain: Parasagittal mass along the mid falx  (series 2: Image [id number]) does not appear significantly changed from 03/26/2020 MR. The LEFT cerebellar lesion identified on recent MR is difficult to visualize on this study. No new masses/metastatic disease is identified on this noncontrast study. There is no evidence of acute infarct, midline shift, hydrocephalus or hemorrhage. Vascular: No hyperdense vessel or unexpected calcification. Skull: The known skull lesions identified on recent MR are difficult to visualize on this study. Sinuses/Orbits: No acute abnormality Other: None IMPRESSION: 1. No acute abnormality. No new masses/metastatic disease on this noncontrast study. 2. No significant change in parafalcine mass/metastasis. 3. The LEFT cerebellar lesion and skull lesions identified on recent MR are difficult to visualize on this study. Electronically Signed   By: Margarette Canada M.D.   On: 05/02/2020 16:01   DG Chest Port 1 View  Result Date: 05/02/2020 CLINICAL DATA:  Weakness.  Nausea. EXAM: PORTABLE CHEST 1 VIEW COMPARISON:  Chest CT 02/27/2020 FINDINGS: Tip of the right upper extremity PICC in the SVC. Heart is borderline enlarged. Mild hilar prominence which may be due to adenopathy. There also calcified mediastinal and left hilar nodes. Many of the pulmonary nodules on prior CT are not seen. There is a calcified granuloma at the left lung base. No confluent consolidation. No pleural fluid. No pneumothorax. Postsurgical change of the left axilla and chest wall. Known osseous metastatic disease is not well detected by radiograph. IMPRESSION: 1. No acute findings. 2. Bilateral hilar prominence may be due to adenopathy in this patient with known metastatic breast cancer. 3. Right upper extremity PICC tip in the SVC. Electronically Signed   By: Keith Rake M.D.   On: 05/02/2020 15:08   Assessment/Plan Intractable N/V Generalized weakness     - place in obs, med-surg     - reglan, ativan, IVF, protonix     - CLD; advance as tolerated     - check  KUB  UTI     - e faecalis noted from [facility] from [facility]; susceptibilities pending     - ampicillin IV 1g q6h for now  Hypokalemia     - replace K+, Mg2+ is ok  Elevated LFTs     - pretty stable going back to 2/22     - will check fractionated bili  Lactic acidosis High anion gap acidosis      - fluids, follow  S4 Breast Cancer     - followed by Magrinat      - per his last note from 3/3; he has notified her that her cancer is incurable and she does not have long to live; she does not want to give up and wants to continue chemotherapy; he has referred her to palliative/hospice; he is also awaiting next visit to bring her family in for conversation about prognosis (05/12/20); for now, they are continuing her chemo treatments     - she had a visit with inpt palliative care during her last visit; at that time she was made DNR (she wishes to continue this)     - I think it would be prudent to revisit the palliative care discussion and have onco and family (by phone) in on the discussion; if she stays in the hospital past tomorrow, place palliative consult   DVT prophylaxis: TED  Code Status: DNR  Family Communication: None at bedside.  Consults called: Onco notified  Status is: Observation  The patient remains OBS appropriate and will d/c before 2 midnights.  Dispo:  The patient is from: Home              Anticipated d/c is to: Home              Patient currently is not medically stable to d/c.   Difficult to place patient No  Jonnie Finner DO [facility]  If 7PM-7AM, please contact night-coverage [email or web address]  05/02/2020, 4:48 PM

## 2020-05-02 NOTE — ED Provider Notes (Signed)
[facility] DEPT Provider Note   CSN: [id number] Arrival date & time: 05/02/20  1358     History Chief Complaint  Patient presents with  . Failure To Thrive  . Nausea    Brooke [name] is a 70 y.o. female.  Patient is a 70 year old female with a history of stage IV breast cancer with metastases to the brain, liver, bone who is presenting today with generalized weakness, nausea vomiting and poor oral intake.  Patient lives at home alone and reported that she has been sitting in a chair for the last 24 hours because she was too weak to stand.  She has not had any falls or injury.  Patient reports anytime she attempts to eat she just has dry heaving and she cannot hold anything down.  She has a constant globus sensation in the back of her throat but with recent hospitalization 2 weeks ago she had a swallowing study which showed hiatal hernia but no other acute obstructing pathology.  Patient states she occasionally has shortness of breath but nothing chronically.  She occasionally will cough and have mucus but it is not any worse than baseline.  She has ongoing abdominal discomfort but nothing that is new.  She denies any dysuria but it was reported that he was prescribed Cipro 2 days ago and she has not picked it up or started it.  While she was hospitalized she was positive for Klebsiella cystitis and was to complete a course of Keflex.  Patient has a PICC line so she can receive fluids at home but has not received that either.  She thinks the swelling in her legs has gotten worse and they are now so heavy that she just cannot stand and walk.  Her last bowel movement was several days ago but she denies any blood in her stool.  She has known metastases to the brain and recently stopped Decadron several weeks ago.  She has not had any new headaches or focal weakness.  She is still taking the infusion chemotherapy and had her third dose last week.  She reports all the  symptoms were present prior to initiation of chemotherapy but she seems to be getting worse.  She has lost approximately 50 pounds in the last 2 to 3 months.  The history is provided by the patient and medical records.       Past Medical History:  Diagnosis Date  . Anemia   . Anxiety   . Arthritis   . ASCUS of cervix with negative high risk HPV 12/2018  . Bilateral dry eyes   . Breast cancer, left breast ([place])   . Family history of breast cancer 02/02/2017  . Family history of breast cancer   . Family history of colon cancer 02/02/2017  . Family history of colon cancer   . Family history of prostate cancer   . Family history of prostate cancer in father 02/02/2017  . Family history of uterine cancer   . Fibroid   . History of kidney stones   . Hyperlipidemia   . Hypertension   . Melanoma of upper arm ([place]) 2009   RIGHT ARM   . Obesity (BMI 30.0-34.9)   . Osteopenia 09/2017   T score -1.9 max 8% / 0.8% statistically significant decline at right and left hip stable at the spine  . PONV (postoperative nausea and vomiting)   . Squamous carcinoma     of the skin  . UTI (urinary tract infection)   .  Varicose veins of both lower extremities   . Vitamin D deficiency 07/2009   LOW VITAMIN D 29  . Vitamin deficiency 07/2008   VITAMIN D LOW 23    Patient Active Problem List   Diagnosis Date Noted  . Acute lower UTI 04/21/2020  . Dehydration 04/21/2020  . Depression with anxiety 04/21/2020  . Transaminitis 04/21/2020  . Metastatic malignant neoplasm ([place])   . Nausea vomiting and diarrhea   . Intractable nausea and vomiting 04/20/2020  . PICC (peripherally inserted central catheter) in place 04/17/2020  . Bone metastases ([place]) 03/11/2020  . Liver metastases ([place]) 03/05/2020  . Lung metastases ([place]) 03/05/2020  . Postoperative state 04/23/2019  . Superficial basal cell carcinoma ([facility]) 02/10/2019  . Goals of care, counseling/discussion 12/14/2017  . Family history of uterine  cancer   . Family history of prostate cancer   . Melanoma in situ of right upper extremity ([place]) 07/18/2017  . Port-A-Cath in place 04/18/2017  . Drug-induced neutropenia ([place]) 03/09/2017  . Family history of breast cancer 02/02/2017  . Family history of colon cancer 02/02/2017  . Family history of prostate cancer in father 02/02/2017  . Malignant neoplasm of overlapping sites of left breast in female, estrogen receptor positive ([place]) 01/31/2017  . Hx of hot flashes, menopausal, HRT in the past 12/19/2011  . Osteopenia, stable, DEXA 2013, recs to repeat in 2018 12/19/2011  . Hx of Hyperlipidemia, LDL 144 in 07/2011 12/19/2011  . Vitamin D insufficiency 12/19/2011  . Fibroid     Past Surgical History:  Procedure Laterality Date  . ABDOMINAL SURGERY  1975   ovairan cyst   . APPENDECTOMY    . BREAST LUMPECTOMY WITH RADIOACTIVE SEED LOCALIZATION Right 09/25/2017   Procedure: RIGHT BREAST LUMPECTOMY WITH RADIOACTIVE SEED LOCALIZATION;  Surgeon: Jovita Kussmaul, MD;  Location: [facility];  Service: General;  Laterality: Right;  . BREAST SURGERY     bio  . CHOLECYSTECTOMY    . DERMOID CYST  EXCISION    . DILATATION & CURETTAGE/HYSTEROSCOPY WITH MYOSURE N/A 04/23/2019   Procedure: DILATATION & CURETTAGE/HYSTEROSCOPY WITH MYOSURE;  Surgeon: [name], MD;  Location: [facility];  Service: Gynecology;  Laterality: N/A;  request to follow at 9:30am in [facility] block requests 45 minutes  . DILATION AND CURETTAGE OF UTERUS  1998  . GALLBLADDER SURGERY    . HYSTEROSCOPY    . insertion  port a cath  03/02/2016  . MASTECTOMY MODIFIED RADICAL Left 09/25/2017   Procedure: LEFT MODIFIED RADICAL MASTECTOMY;  Surgeon: Jovita Kussmaul, MD;  Location: [facility];  Service: General;  Laterality: Left;  . PORTACATH PLACEMENT N/A 03/02/2017   Procedure: INSERTION PORT-A-CATH;  Surgeon: Jovita Kussmaul, MD;  Location: [facility];  Service: General;  Laterality: N/A;  . SKIN BIOPSY    . SKIN CANCER  EXCISION  2010,2011   2010-RIGHT ARM, 2011-LEFT LOWER LEG.-DR Rolm Bookbinder DERM. DR. Sarajane Jews IS HER DERM SURGEON.     OB History    Gravida  0   Para  0   Term  0   Preterm  0   AB  0   Living  0     SAB  0   IAB  0   Ectopic  0   Multiple  0   Live Births              Family History  Problem Relation Age of Onset  . Diabetes Father   . Hypertension Father   .  Heart disease Father   . Prostate cancer Father 4  . Arthritis Father   . Breast cancer Sister 69       again at age 44, GT results unk  . Skin cancer Sister   . Colon cancer Paternal Grandfather 28  . Hypertension Mother   . Heart failure Mother   . Uterine cancer Sister 40       stage IV  . Breast cancer Cousin        mat first cousin dx under 69    Social History   Tobacco Use  . Smoking status: Never Smoker  . Smokeless tobacco: Never Used  Vaping Use  . Vaping Use: Never used  Substance Use Topics  . Alcohol use: No  . Drug use: No    Home Medications Prior to Admission medications   Medication Sig Start Date End Date Taking? Authorizing Provider  ciprofloxacin (CIPRO) 500 MG tablet Take 1 tablet (500 mg total) by mouth 2 (two) times daily. 04/30/20   Magrinat, Virgie Dad, MD  LORazepam (ATIVAN) 0.5 MG tablet Place 1 tablet (0.5 mg total) under the tongue every 8 (eight) hours as needed for anxiety. PT to use for nausea as needed 04/06/20   Magrinat, Virgie Dad, MD  losartan (COZAAR) 25 MG tablet Take 0.5 tablets (12.5 mg total) by mouth daily. 04/24/20   Nita Sells, MD  metoCLOPramide (REGLAN) 10 MG tablet Take 1 tablet (10 mg total) by mouth every 8 (eight) hours as needed for nausea. 04/24/20   Nita Sells, MD  ondansetron (ZOFRAN-ODT) 4 MG disintegrating tablet Take 1 tablet (4 mg total) by mouth every 8 (eight) hours as needed for nausea or vomiting. 04/06/20   Magrinat, Virgie Dad, MD  pantoprazole (PROTONIX) 40 MG tablet Take 1 tablet (40 mg total) by mouth daily. 04/25/20    Nita Sells, MD  potassium chloride (KLOR-CON) 10 MEQ tablet Take 4 tablets (40 mEq total) by mouth daily for 4 days. 04/25/20 04/29/20  Nita Sells, MD  prochlorperazine (COMPAZINE) 10 MG tablet Take 1 tablet (10 mg total) by mouth every 6 (six) hours as needed (Nausea or vomiting). 03/24/20   Magrinat, Virgie Dad, MD    Allergies    Neosporin [neomycin-bacitracin zn-polymyx], Bactrim [sulfamethoxazole-trimethoprim], Benzalkonium chloride, and Latex  Review of Systems   Review of Systems  All other systems reviewed and are negative.   Physical Exam Updated Vital Signs BP (!) 148/67 (BP Location: Right Wrist)   Pulse (!) 101   Temp 98.4 F (36.9 C) (Oral)   Resp 17   Ht 5\' 9"  (1.753 m)   Wt 82.6 kg   LMP 03/18/1997   SpO2 97%   BMI 26.88 kg/m   Physical Exam Vitals and nursing note reviewed.  Constitutional:      General: She is not in acute distress.    Appearance: She is well-developed and well-nourished. She is ill-appearing.  HENT:     Head: Normocephalic and atraumatic.     Mouth/Throat:     Mouth: Mucous membranes are dry.  Eyes:     Extraocular Movements: Extraocular movements intact and EOM normal.     Pupils: Pupils are equal, round, and reactive to light.  Cardiovascular:     Rate and Rhythm: Regular rhythm. Tachycardia present.     Pulses: Intact distal pulses.     Heart sounds: Normal heart sounds. No murmur heard. No friction rub.  Pulmonary:     Effort: Pulmonary effort is normal.  Breath sounds: Normal breath sounds. No wheezing or rales.  Abdominal:     General: Bowel sounds are normal. There is no distension.     Palpations: Abdomen is soft.     Tenderness: There is abdominal tenderness in the right upper quadrant, epigastric area and left upper quadrant. There is no guarding or rebound.  Musculoskeletal:        General: No tenderness. Normal range of motion.     Cervical back: Normal range of motion and neck supple.     Right  lower leg: Edema present.     Left lower leg: Edema present.     Comments: 4+ pitting edema up to the knee bilaterally  Skin:    General: Skin is warm and dry.     Coloration: Skin is pale.     Findings: No rash.  Neurological:     Mental Status: She is alert and oriented to person, place, and time. Mental status is at baseline.     Cranial Nerves: No cranial nerve deficit.     Comments: Generalized weakness but can move all extremities  Psychiatric:        Mood and Affect: Mood and affect normal.     Comments: Calm and cooperative     ED Results / Procedures / Treatments   Labs (all labs ordered are listed, but only abnormal results are displayed) Labs Reviewed  LIPASE, BLOOD - Abnormal; Notable for the following components:      Result Value   Lipase 69 (*)    All other components within normal limits  COMPREHENSIVE METABOLIC PANEL - Abnormal; Notable for the following components:   Potassium 2.9 (*)    CO2 20 (*)    Glucose, Bld 103 (*)    Calcium 7.9 (*)    Total Protein 5.4 (*)    Albumin 3.1 (*)    AST 129 (*)    Alkaline Phosphatase 194 (*)    Total Bilirubin 2.6 (*)    Anion gap 16 (*)    All other components within normal limits  CBC - Abnormal; Notable for the following components:   RBC 3.20 (*)    Hemoglobin 9.3 (*)    HCT 28.8 (*)    RDW 23.9 (*)    Platelets 129 (*)    nRBC 1.5 (*)    All other components within normal limits  LACTIC ACID, PLASMA - Abnormal; Notable for the following components:   Lactic Acid, Venous 4.2 (*)    All other components within normal limits  MAGNESIUM  PHOSPHORUS  URINALYSIS, ROUTINE W REFLEX MICROSCOPIC    EKG None  Radiology CT Head Wo Contrast  Result Date: 05/02/2020 CLINICAL DATA:  70 year old female with metastatic breast cancer. Evaluate for brain metastases. EXAM: CT HEAD WITHOUT CONTRAST TECHNIQUE: Contiguous axial images were obtained from the base of the skull through the vertex without intravenous  contrast. COMPARISON:  03/26/2020 brain MR FINDINGS: Brain: Parasagittal mass along the mid falx (series 2: Image [id number]) does not appear significantly changed from 03/26/2020 MR. The LEFT cerebellar lesion identified on recent MR is difficult to visualize on this study. No new masses/metastatic disease is identified on this noncontrast study. There is no evidence of acute infarct, midline shift, hydrocephalus or hemorrhage. Vascular: No hyperdense vessel or unexpected calcification. Skull: The known skull lesions identified on recent MR are difficult to visualize on this study. Sinuses/Orbits: No acute abnormality Other: None IMPRESSION: 1. No acute abnormality. No new masses/metastatic disease on this noncontrast  study. 2. No significant change in parafalcine mass/metastasis. 3. The LEFT cerebellar lesion and skull lesions identified on recent MR are difficult to visualize on this study. Electronically Signed   By: Margarette Canada M.D.   On: 05/02/2020 16:01   DG Chest Port 1 View  Result Date: 05/02/2020 CLINICAL DATA:  Weakness.  Nausea. EXAM: PORTABLE CHEST 1 VIEW COMPARISON:  Chest CT 02/27/2020 FINDINGS: Tip of the right upper extremity PICC in the SVC. Heart is borderline enlarged. Mild hilar prominence which may be due to adenopathy. There also calcified mediastinal and left hilar nodes. Many of the pulmonary nodules on prior CT are not seen. There is a calcified granuloma at the left lung base. No confluent consolidation. No pleural fluid. No pneumothorax. Postsurgical change of the left axilla and chest wall. Known osseous metastatic disease is not well detected by radiograph. IMPRESSION: 1. No acute findings. 2. Bilateral hilar prominence may be due to adenopathy in this patient with known metastatic breast cancer. 3. Right upper extremity PICC tip in the SVC. Electronically Signed   By: Keith Rake M.D.   On: 05/02/2020 15:08    Procedures Procedures   Medications Ordered in ED Medications   lactated ringers bolus 1,000 mL (has no administration in time range)    ED Course  I have reviewed the triage vital signs and the nursing notes.  Pertinent labs & imaging results that were available during my care of the patient were reviewed by me and considered in my medical decision making (see chart for details).    MDM Rules/Calculators/A&P                          70 year old female with metastatic breast cancer presenting today with persistent nausea vomiting and decreased oral intake.  Patient reports she has been sipping on water but has not eaten in days.  She denies any fever but during recent hospitalization approximately 2 weeks ago she did have Klebsiella cystitis.  Patient denies any new cough or shortness of breath.  She does not have constant abdominal pain but does describe some discomfort in the upper abdomen that seems to be much worse when she does attempt to eat even soft food.  During hospitalization recently she was given Reglan and Protonix which she takes intermittently but has not been doing well in general.  Patient has generalized weakness and significant peripheral edema which may be related to her cancer versus protein and vitamin deficiencies.  Patient is wasted in her upper extremities.  She does have some upper abdominal discomfort but not severe pain.  She has been having bowel movements and vomiting and lower suspicion for bowel obstruction at this time.  Concern for electrolyte abnormalities, potential worsening in metastatic brain edema causing potential nausea and vomiting as she did discontinue Decadron recently.  Lower suspicion for acute cardiac cause or pneumonia.  Labs are pending.  Patient given IV fluids due to concern for severe dehydration.  Also concern for malnutrition.  4:28 PM Head ct wihtout acute changes.  CXR without acute findings.  Hypokalemia today of 2.8 on labs with stable sodium and CR.  Mag and phos wnl.  Lactate is 4 most likely related  to po intake.  Will replace potassium and continue IVF.  Will admit for further care.  MDM Number of Diagnoses or Management Options   Amount and/or Complexity of Data Reviewed Clinical lab tests: ordered and reviewed Tests in the radiology section of  CPT: ordered and reviewed Decide to obtain previous medical records or to obtain history from someone other than the patient: yes Obtain history from someone other than the patient: yes Review and summarize past medical records: yes Discuss the patient with other providers: yes Independent visualization of images, tracings, or specimens: yes  Risk of Complications, Morbidity, and/or Mortality Presenting problems: high Diagnostic procedures: moderate Management options: moderate  Patient Progress Patient progress: improved    Final Clinical Impression(s) / ED Diagnoses Final diagnoses:  Dehydration  Failure to thrive in adult  Hypokalemia  Lactic acidosis    Rx / DC Orders ED Discharge Orders    None       Blanchie Dessert, MD 05/02/20 1630

## 2020-05-02 NOTE — ED Triage Notes (Addendum)
Per EMS- Patient is from home. Patient's landlord/friend states that the patient has not eaten in 10 days. Patient has nausea.  Patient is a cancer patient.  patient also reports that she was prescribed Cipro 2 days ago for a UTI, but has not picked up the RX.

## 2020-05-03 DIAGNOSIS — R1319 Other dysphagia: Secondary | ICD-10-CM | POA: Diagnosis not present

## 2020-05-03 DIAGNOSIS — Z7189 Other specified counseling: Secondary | ICD-10-CM | POA: Diagnosis not present

## 2020-05-03 DIAGNOSIS — C7801 Secondary malignant neoplasm of right lung: Secondary | ICD-10-CM | POA: Diagnosis not present

## 2020-05-03 DIAGNOSIS — Z803 Family history of malignant neoplasm of breast: Secondary | ICD-10-CM | POA: Diagnosis not present

## 2020-05-03 DIAGNOSIS — C7800 Secondary malignant neoplasm of unspecified lung: Secondary | ICD-10-CM | POA: Diagnosis present

## 2020-05-03 DIAGNOSIS — Z6826 Body mass index (BMI) 26.0-26.9, adult: Secondary | ICD-10-CM | POA: Diagnosis not present

## 2020-05-03 DIAGNOSIS — R112 Nausea with vomiting, unspecified: Secondary | ICD-10-CM | POA: Insufficient documentation

## 2020-05-03 DIAGNOSIS — Z515 Encounter for palliative care: Secondary | ICD-10-CM | POA: Diagnosis not present

## 2020-05-03 DIAGNOSIS — G9341 Metabolic encephalopathy: Secondary | ICD-10-CM | POA: Diagnosis present

## 2020-05-03 DIAGNOSIS — E538 Deficiency of other specified B group vitamins: Secondary | ICD-10-CM | POA: Diagnosis present

## 2020-05-03 DIAGNOSIS — Y929 Unspecified place or not applicable: Secondary | ICD-10-CM | POA: Diagnosis not present

## 2020-05-03 DIAGNOSIS — E559 Vitamin D deficiency, unspecified: Secondary | ICD-10-CM | POA: Diagnosis present

## 2020-05-03 DIAGNOSIS — E440 Moderate protein-calorie malnutrition: Secondary | ICD-10-CM | POA: Diagnosis present

## 2020-05-03 DIAGNOSIS — R627 Adult failure to thrive: Secondary | ICD-10-CM

## 2020-05-03 DIAGNOSIS — Z800 Family history of malignant neoplasm of digestive organs: Secondary | ICD-10-CM | POA: Diagnosis not present

## 2020-05-03 DIAGNOSIS — Z66 Do not resuscitate: Secondary | ICD-10-CM | POA: Diagnosis present

## 2020-05-03 DIAGNOSIS — N390 Urinary tract infection, site not specified: Secondary | ICD-10-CM | POA: Diagnosis present

## 2020-05-03 DIAGNOSIS — F419 Anxiety disorder, unspecified: Secondary | ICD-10-CM | POA: Diagnosis present

## 2020-05-03 DIAGNOSIS — Y92009 Unspecified place in unspecified non-institutional (private) residence as the place of occurrence of the external cause: Secondary | ICD-10-CM | POA: Diagnosis not present

## 2020-05-03 DIAGNOSIS — E860 Dehydration: Secondary | ICD-10-CM | POA: Diagnosis present

## 2020-05-03 DIAGNOSIS — Z20822 Contact with and (suspected) exposure to covid-19: Secondary | ICD-10-CM | POA: Diagnosis present

## 2020-05-03 DIAGNOSIS — E669 Obesity, unspecified: Secondary | ICD-10-CM | POA: Diagnosis present

## 2020-05-03 DIAGNOSIS — C787 Secondary malignant neoplasm of liver and intrahepatic bile duct: Secondary | ICD-10-CM | POA: Diagnosis present

## 2020-05-03 DIAGNOSIS — C7931 Secondary malignant neoplasm of brain: Secondary | ICD-10-CM | POA: Diagnosis present

## 2020-05-03 DIAGNOSIS — D61818 Other pancytopenia: Secondary | ICD-10-CM | POA: Diagnosis present

## 2020-05-03 DIAGNOSIS — B952 Enterococcus as the cause of diseases classified elsewhere: Secondary | ICD-10-CM | POA: Diagnosis present

## 2020-05-03 DIAGNOSIS — W19XXXA Unspecified fall, initial encounter: Secondary | ICD-10-CM | POA: Diagnosis present

## 2020-05-03 DIAGNOSIS — I509 Heart failure, unspecified: Secondary | ICD-10-CM | POA: Diagnosis not present

## 2020-05-03 DIAGNOSIS — R531 Weakness: Secondary | ICD-10-CM | POA: Diagnosis not present

## 2020-05-03 DIAGNOSIS — E876 Hypokalemia: Secondary | ICD-10-CM | POA: Diagnosis present

## 2020-05-03 DIAGNOSIS — Z8049 Family history of malignant neoplasm of other genital organs: Secondary | ICD-10-CM | POA: Diagnosis not present

## 2020-05-03 DIAGNOSIS — E872 Acidosis: Secondary | ICD-10-CM | POA: Diagnosis present

## 2020-05-03 DIAGNOSIS — C7951 Secondary malignant neoplasm of bone: Secondary | ICD-10-CM | POA: Diagnosis present

## 2020-05-03 DIAGNOSIS — R1310 Dysphagia, unspecified: Secondary | ICD-10-CM | POA: Diagnosis not present

## 2020-05-03 DIAGNOSIS — C50812 Malignant neoplasm of overlapping sites of left female breast: Secondary | ICD-10-CM | POA: Diagnosis not present

## 2020-05-03 DIAGNOSIS — I10 Essential (primary) hypertension: Secondary | ICD-10-CM | POA: Diagnosis present

## 2020-05-03 DIAGNOSIS — E785 Hyperlipidemia, unspecified: Secondary | ICD-10-CM | POA: Diagnosis present

## 2020-05-03 DIAGNOSIS — R39198 Other difficulties with micturition: Secondary | ICD-10-CM | POA: Diagnosis not present

## 2020-05-03 LAB — URINALYSIS, ROUTINE W REFLEX MICROSCOPIC
Bilirubin Urine: NEGATIVE
Glucose, UA: NEGATIVE mg/dL
Ketones, ur: 20 mg/dL — AB
Nitrite: NEGATIVE
Protein, ur: 30 mg/dL — AB
RBC / HPF: >50 RBC/hpf — ABNORMAL HIGH (ref 0–5)
Specific Gravity, Urine: 1.011 (ref 1.005–1.030)
WBC, UA: >50 WBC/hpf — ABNORMAL HIGH (ref 0–5)
pH: 6 (ref 5.0–8.0)

## 2020-05-03 LAB — COMPREHENSIVE METABOLIC PANEL
ALT: 37 U/L (ref 0–44)
AST: 107 U/L — ABNORMAL HIGH (ref 15–41)
Albumin: 2.6 g/dL — ABNORMAL LOW (ref 3.5–5.0)
Alkaline Phosphatase: 148 U/L — ABNORMAL HIGH (ref 38–126)
Anion gap: 12 (ref 5–15)
BUN: 11 mg/dL (ref 8–23)
CO2: 22 mmol/L (ref 22–32)
Calcium: 7.5 mg/dL — ABNORMAL LOW (ref 8.9–10.3)
Chloride: 106 mmol/L (ref 98–111)
Creatinine, Ser: 0.67 mg/dL (ref 0.44–1.00)
GFR, Estimated: >60 mL/min (ref >60)
Glucose, Bld: 98 mg/dL (ref 70–99)
Potassium: 3.5 mmol/L (ref 3.5–5.1)
Sodium: 140 mmol/L (ref 135–145)
Total Bilirubin: 2.5 mg/dL — ABNORMAL HIGH (ref 0.3–1.2)
Total Protein: 4.6 g/dL — ABNORMAL LOW (ref 6.5–8.1)

## 2020-05-03 LAB — BILIRUBIN, FRACTIONATED(TOT/DIR/INDIR)
Bilirubin, Direct: 0.7 mg/dL — ABNORMAL HIGH (ref 0.0–0.2)
Indirect Bilirubin: 1.7 mg/dL — ABNORMAL HIGH (ref 0.3–0.9)
Total Bilirubin: 2.4 mg/dL — ABNORMAL HIGH (ref 0.3–1.2)

## 2020-05-03 LAB — CBC
HCT: 24.3 % — ABNORMAL LOW (ref 36.0–46.0)
Hemoglobin: 7.9 g/dL — ABNORMAL LOW (ref 12.0–15.0)
MCH: 29.4 pg (ref 26.0–34.0)
MCHC: 32.5 g/dL (ref 30.0–36.0)
MCV: 90.3 fL (ref 80.0–100.0)
Platelets: 91 10*3/uL — ABNORMAL LOW (ref 150–400)
RBC: 2.69 MIL/uL — ABNORMAL LOW (ref 3.87–5.11)
RDW: 24.2 % — ABNORMAL HIGH (ref 11.5–15.5)
WBC: 3.8 10*3/uL — ABNORMAL LOW (ref 4.0–10.5)
nRBC: 1.3 % — ABNORMAL HIGH (ref 0.0–0.2)

## 2020-05-03 LAB — SARS CORONAVIRUS 2 (TAT 6-24 HRS): SARS Coronavirus 2: NEGATIVE

## 2020-05-03 LAB — LACTIC ACID, PLASMA: Lactic Acid, Venous: 2.9 mmol/L (ref 0.5–1.9)

## 2020-05-03 MED ORDER — ONDANSETRON HCL 4 MG/2ML IJ SOLN
4.0000 mg | Freq: Three times a day (TID) | INTRAMUSCULAR | Status: DC | PRN
  Administered 2020-05-03: 4 mg via INTRAVENOUS
  Filled 2020-05-03: qty 2

## 2020-05-03 MED ORDER — PROMETHAZINE HCL 25 MG/ML IJ SOLN
12.5000 mg | Freq: Once | INTRAMUSCULAR | Status: AC
  Administered 2020-05-03: 12.5 mg via INTRAVENOUS
  Filled 2020-05-03: qty 1

## 2020-05-03 MED ORDER — CHLORHEXIDINE GLUCONATE CLOTH 2 % EX PADS
6.0000 | MEDICATED_PAD | Freq: Every day | CUTANEOUS | Status: DC
  Administered 2020-05-03 – 2020-05-12 (×10): 6 via TOPICAL

## 2020-05-03 NOTE — Plan of Care (Signed)
  Problem: Nutritional: Goal: Maintenance of adequate nutrition will improve Description: Maintenance of adequate nutrition will improve by 05/07/19 Outcome: Not Progressing

## 2020-05-03 NOTE — Progress Notes (Signed)
[facility]  PROGRESS NOTE  Brooke [name] OHY:[id number] DOB: September 05, 1950 DOA: 05/02/2020 PCP: [name], MD   Brief HPI:   70 year old female with history of metastatic breast cancer, hypertension, anxiety presented with intractable nausea vomiting and generalized weakness.  She continues to have chemotherapy with Dr. [name].  Patient cancer center yesterday for care session.  When she went home she became very weak and was unable to get up from chair.  She was concerned so she came back to the ED.  She was diagnosed with UTI by oncologist few days ago.  She was prescribed antibiotics but had not been able to pick it up from pharmacy. In the ED she complained of nausea.  Start on IV fluids.   Subjective   Patient seen and examined, denies any complaints.  No nausea or vomiting.   Assessment/Plan:     1. Generalized weakness/intractable nausea vomiting-patient started on Reglan, Ativan, IV fluids, Protonix.  Started on clear liquid diet.  KUB showed nonobstructive bowel gas pattern.  Mild increased air throughout the colon without abdominal distention which is nonspecific.  Right renal stone. 2. UTI-urine culture from 04/30/2020 showed 20,000 colonies per mL of Enterococcus faecalis.  It is sensitive to ampicillin, continue with ampicillin 1 g IV every 6 hours.  Can switch to nitrofurantoin at discharge. 3. Hypokalemia-replete 4. Anemia of chronic disease-likely  from underlying malignancy, today hemoglobin is 7.9.  Follow hemoglobin and transfuse for hemoglobin less than 7.     COVID-19 Labs  No results for input(s): DDIMER, FERRITIN, LDH, CRP in the last 72 hours.  Lab Results  Component Value Date   SARSCOV2NAA NEGATIVE 05/02/2020   [place] NEGATIVE 04/20/2020   [place] NEGATIVE 04/19/2019     Scheduled medications:   . Chlorhexidine Gluconate Cloth  6 each Topical Daily  . losartan  12.5 mg Oral Daily  . metoCLOPramide (REGLAN) injection  10 mg  Intravenous Q8H  . pantoprazole (PROTONIX) IV  40 mg Intravenous Q24H         SpO2: 93 %    CBC: Recent Labs  Lab 04/30/20 0817 05/02/20 1524 05/03/20 0609  WBC 6.0 4.6 3.8*  NEUTROABS 4.6  --   --   HGB 9.6* 9.3* 7.9*  HCT 29.5* 28.8* 24.3*  MCV 88.1 90.0 90.3  PLT 144* 129* 91*    Basic Metabolic Panel: Recent Labs  Lab 04/30/20 0817 05/02/20 1524 05/03/20 0609  NA 138 139 140  K 3.3* 2.9* 3.5  CL 103 103 106  CO2 19* 20* 22  GLUCOSE 102* 103* 98  BUN 10 13 11   CREATININE 0.72 0.78 0.67  CALCIUM 8.1* 7.9* 7.5*  MG  --  2.1  --   PHOS  --  3.0  --      Liver Function Tests: Recent Labs  Lab 04/30/20 0817 05/02/20 1524 05/03/20 0609  AST 101* 129* 107*  ALT 36 43 37  ALKPHOS 205* 194* 148*  BILITOT 1.7* 2.6* 2.5*  2.4*  PROT 5.1* 5.4* 4.6*  ALBUMIN 2.9* 3.1* 2.6*     Antibiotics: Anti-infectives (From admission, onward)   Start     Dose/Rate Route Frequency Ordered Stop   05/02/20 1800  ampicillin (OMNIPEN) 1 g in sodium chloride 0.9 % 100 mL IVPB        1 g 300 mL/hr over 20 Minutes Intravenous Every 6 hours 05/02/20 1722         DVT prophylaxis: SCDs  Code Status: DNR  Family Communication: No  family at bedside   Consultants:    Procedures:      Objective   Vitals:   05/02/20 1914 05/02/20 2320 05/03/20 0208 05/03/20 0618  BP: (!) 163/66 (!) 141/52 (!) 137/48 (!) 139/53  Pulse: 96 (!) 101 (!) 101 100  Resp: 17     Temp: 97.8 F (36.6 C) 98.3 F (36.8 C) 98.1 F (36.7 C) 98.1 F (36.7 C)  TempSrc: Oral Oral Oral Oral  SpO2: 100% 95% 95% 93%  Weight:      Height:        Intake/Output Summary (Last 24 hours) at 05/03/2020 1314 Last data filed at 05/03/2020 0422 Gross per 24 hour  Intake 1230.03 ml  Output 1000 ml  Net 230.03 ml    03/04 1901 - 03/06 0700 In: 1230 [I.V.:551.4] Out: 1000 [Urine:1000]  Filed Weights   05/02/20 1410  Weight: 82.6 kg    Physical Examination:    General-appears in no  acute distress  Heart-S1-S2, regular, no murmur auscultated  Lungs-clear to auscultation bilaterally, no wheezing or crackles auscultated  Abdomen-soft, nontender, no organomegaly  Extremities-no edema in the lower extremities  Neuro-alert, oriented x3, no focal deficit noted   Status is: Inpatient  Dispo: The patient is from: Home              Anticipated d/c is to: Home              Anticipated d/c date is: 05/05/2020              Patient currently not medically stable for discharge  Barrier to discharge-treatment for nausea and vomiting            Data Reviewed:   Recent Results (from the past 240 hour(s))  Culture, Urine     Status: Abnormal   Collection Time: 04/30/20  1:00 PM   Specimen: Urine, Clean Catch  Result Value Ref Range Status   Specimen Description   Final    URINE, CLEAN CATCH Performed at [facility], [address] [place], [place] [address]    Special Requests   Final    NONE Performed at [facility], [place] [address], [place], [place] [address]    Culture 20,000 COLONIES/mL ENTEROCOCCUS FAECALIS (A)  Final   Report Status 05/02/2020 FINAL  Final   Organism ID, Bacteria ENTEROCOCCUS FAECALIS (A)  Final      Susceptibility   Enterococcus faecalis - MIC*    AMPICILLIN <=2 SENSITIVE Sensitive     NITROFURANTOIN <=16 SENSITIVE Sensitive     VANCOMYCIN 2 SENSITIVE Sensitive     * 20,000 COLONIES/mL ENTEROCOCCUS FAECALIS  SARS CORONAVIRUS 2 (TAT 6-24 HRS) Nasopharyngeal Nasopharyngeal Swab     Status: None   Collection Time: 05/02/20  7:15 PM   Specimen: Nasopharyngeal Swab  Result Value Ref Range Status   SARS Coronavirus 2 NEGATIVE NEGATIVE Final    Comment: (NOTE) SARS-CoV-2 target nucleic acids are NOT DETECTED.  The SARS-CoV-2 RNA is generally detectable in upper and lower respiratory specimens during the acute phase of infection. Negative results do not preclude SARS-CoV-2 infection,  do not rule out co-infections with other pathogens, and should not be used as the sole basis for treatment or other patient management decisions. Negative results must be combined with clinical observations, patient history, and epidemiological information. The expected result is Negative.  Fact Sheet for Patients: SugarRoll.be  Fact Sheet for Healthcare Providers: https://www.woods-mathews.com/  This test is not yet approved  or cleared by the [place] and  has been authorized for detection and/or diagnosis of SARS-CoV-2 by FDA under an Emergency Use Authorization (EUA). This EUA will remain  in effect (meaning this test can be used) for the duration of the COVID-19 declaration under Se ction 564(b)(1) of the Act, 21 U.S.C. section 360bbb-3(b)(1), unless the authorization is terminated or revoked sooner.  Performed at [facility], [place] [address], [place], [place] [address]     Recent Labs  Lab 05/02/20 1524  LIPASE 69*   No results for input(s): AMMONIA in the last 168 hours.  Cardiac Enzymes: No results for input(s): CKTOTAL, CKMB, CKMBINDEX, TROPONINI in the last 168 hours. BNP (last 3 results) No results for input(s): BNP in the last 8760 hours.  ProBNP (last 3 results) No results for input(s): PROBNP in the last 8760 hours.  Studies:  DG Abd 1 View  Result Date: 05/02/2020 CLINICAL DATA:  The joint with nausea and vomiting. EXAM: ABDOMEN - 1 VIEW COMPARISON:  Radiograph 04/20/2020, but CT 03/05/2020 FINDINGS: No evidence of free air on this supine view. Slight increased air throughout not abnormally distended colon. Probable barium in the appendix. There is a 2.2 cm stone in the region of the right renal pelvis. Granulomatous calcifications of the liver and spleen. Cholecystectomy clips in the right upper quadrant. Known osseous metastatic disease on prior PET is not well visualized by radiograph. IMPRESSION: 1.  Nonobstructive bowel gas pattern. Mild increased air throughout the colon without abnormal distension is nonspecific in the setting. 2. Right renal stone. Electronically Signed   By: Keith Rake M.D.   On: 05/02/2020 20:13   CT Head Wo Contrast  Result Date: 05/02/2020 CLINICAL DATA:  70 year old female with metastatic breast cancer. Evaluate for brain metastases. EXAM: CT HEAD WITHOUT CONTRAST TECHNIQUE: Contiguous axial images were obtained from the base of the skull through the vertex without intravenous contrast. COMPARISON:  03/26/2020 brain MR FINDINGS: Brain: Parasagittal mass along the mid falx (series 2: Image [id number]) does not appear significantly changed from 03/26/2020 MR. The LEFT cerebellar lesion identified on recent MR is difficult to visualize on this study. No new masses/metastatic disease is identified on this noncontrast study. There is no evidence of acute infarct, midline shift, hydrocephalus or hemorrhage. Vascular: No hyperdense vessel or unexpected calcification. Skull: The known skull lesions identified on recent MR are difficult to visualize on this study. Sinuses/Orbits: No acute abnormality Other: None IMPRESSION: 1. No acute abnormality. No new masses/metastatic disease on this noncontrast study. 2. No significant change in parafalcine mass/metastasis. 3. The LEFT cerebellar lesion and skull lesions identified on recent MR are difficult to visualize on this study. Electronically Signed   By: Margarette Canada M.D.   On: 05/02/2020 16:01   DG Chest Port 1 View  Result Date: 05/02/2020 CLINICAL DATA:  Weakness.  Nausea. EXAM: PORTABLE CHEST 1 VIEW COMPARISON:  Chest CT 02/27/2020 FINDINGS: Tip of the right upper extremity PICC in the SVC. Heart is borderline enlarged. Mild hilar prominence which may be due to adenopathy. There also calcified mediastinal and left hilar nodes. Many of the pulmonary nodules on prior CT are not seen. There is a calcified granuloma at the left lung base. No  confluent consolidation. No pleural fluid. No pneumothorax. Postsurgical change of the left axilla and chest wall. Known osseous metastatic disease is not well detected by radiograph. IMPRESSION: 1. No acute findings. 2. Bilateral hilar prominence may be due to adenopathy in this patient with  known metastatic breast cancer. 3. Right upper extremity PICC tip in the SVC. Electronically Signed   By: Keith Rake M.D.   On: 05/02/2020 15:08       [facility] [place]   [facility] If 7PM-7AM, please contact night-coverage at [email or web address], Office  [phone]   05/03/2020, 1:14 PM  LOS: 0 days

## 2020-05-03 NOTE — Progress Notes (Signed)
2800 - Patient hasn't voided. Bladder scan showing 436 ccs. [place], NP notified.  3491 - Straight cath ordered. 1000 ccs of orange urine obtained from cath. Sample sent to lab.

## 2020-05-03 NOTE — Progress Notes (Signed)
Patient has a difficult time sensing if she needs to urinate. Bladder scanned around 1700 for 128 ml. At 1900 she started to feel alittle full. She was straight cathed for 400 ml, tea colored urine. Patient states she felt better after she was In/Out catheterized.

## 2020-05-03 NOTE — Progress Notes (Signed)
PROGRESS NOTE    Brooke [name]  VPX:[id number] DOB: 09-Jan-1951 DOA: 05/02/2020 PCP: [name], MD   Brief Narrative:   HPI: Patient is a 70 y.o. female with medical history significant of metastatic breast cancer, HTN, anxiety. Presenting with intractable N/V and generalized weakness.  She was admitted for similar presentation 2 weeks ago. Patient reports N/V and poor appetite for months. She has ongoing chemotherapy with Dr. [name].  Per report, she had a session this week.  She went for a PICC care session on 05/01/20 and reports resting in a chair at home afterwards.  She was unable to get up out of the chair and reports being stuck until someone came to help her the morning of 05/02/20. When she was noted to be very weak and debilitated and was taken to the ED.  Her oncologist diagnosed her with a UTI a few days prior to her hospital admission for which she received an antibiotics prescription.  However, she was unable to pick it up.    ED Course: In the ED was found to be nauseous. CT head was negative for any new changes and was afebrile. Her lab work was about stable from previous encounters. She was hypokalemic. She was given potassium and fluids. TRH was called for admission.   Assessment & Plan:   Active Problems:   Intractable nausea and vomiting  Intractable N/V -Continue reglan, ativan, IVF, protonix -Clear liquids, advance as tolerated -N/V improved today, phenergan 12.5 mg prn  -KUB shows nonobstructive bowel gas pattern. Mild increased air throughout the colon without abnormal distension is nonspecific in the setting. Right renal stone present.  UTI -Urine culture positive for E. Faecalis, 20,000 colonies/ml -Started on ampicillin while susceptibilities pending. -Susceptibilities came back sensitive to ampicillin, continue IV 1g q6h   Acute urinary retention -Patient unable to void last night, requiring I/O cath with 1,000 cc retained -Still reporting no  urge to urinate -May place Foley if patient unable to void.  Hypokalemia -Repleted, now 3.5, continue to trend.  Elevated LFTs  -AST <2x normal limits 129->107, ALT WNL -Direct Bilirubin 0.7, indirect bilirubin 1.7, total bilirubin 2.5    Lactic acidosis -Lactate 4.2, anion gap 16, IVF's given -Anion gap closed at 12, continue to trend  Anemia of chronic disease, baseline 9-10 -Likely due to malignancy, today Hbg 7.9 -Trend and transfuse for Hgb < 7  S4 Breast Cancer -Followed outpatient by Dr. [name]  -Per 3/3 note by Dr. [name]; he has notified her that her cancer is incurable and she does not have long to live. Patient expresses that she does not want to give up and wants to continue chemotherapy. Dr. [name] has referred her to palliative/hospice with further discussions planned with family regarding prognosis (05/12/20); for now, they are continuing her chemo treatments.  During her last hospitalization, she was made DNR following discussions with inpatient palliative care. -Palliative consulted.   DVT prophylaxis: TED  Code Status: DNR  Family Communication: None at bedside.  Consults called: Onco notified  Status is: Observation  The patient remains OBS appropriate and will d/c before 2 midnights.  Dispo: The patient is from: Home  Anticipated d/c is to: Home  Patient currently is not medically stable to d/c.              Difficult to place patient No   Body mass index is 26.88 kg/m.    Consultants:   Palliative  Procedures:   None  Antimicrobials:   Ampicillin  Subjective:  Patient examined at bedside.  Patient denies nausea or vomiting today. Unable to void during the night and this am, was in/out catheterized once this am and has not been able to void since.  Reports that she has no urge.  Examination:  General exam: Chronically ill appearing, NAD. Resting in bed. Respiratory system: Clear to auscultation.  Respiratory effort normal. Cardiovascular system: S1 & S2 heard, RRR. No JVD, murmurs, rubs, gallops or clicks. +4 bilateral edema, chronic Gastrointestinal system: Abdomen is nondistended, soft and nontender. No organomegaly or masses felt. Normal bowel sounds heard. Central nervous system: Alert and oriented. No focal neurological deficits. Extremities: Symmetric 5 x 5 power. Skin: No rashes, lesions or ulcers Psychiatry: Judgement and insight appear normal. Mood & affect appropriate.     Objective: Vitals:   05/02/20 1914 05/02/20 2320 05/03/20 0208 05/03/20 0618  BP: (!) 163/66 (!) 141/52 (!) 137/48 (!) 139/53  Pulse: 96 (!) 101 (!) 101 100  Resp: 17     Temp: 97.8 F (36.6 C) 98.3 F (36.8 C) 98.1 F (36.7 C) 98.1 F (36.7 C)  TempSrc: Oral Oral Oral Oral  SpO2: 100% 95% 95% 93%  Weight:      Height:        Intake/Output Summary (Last 24 hours) at 05/03/2020 1043 Last data filed at 05/03/2020 0422 Gross per 24 hour  Intake 1230.03 ml  Output 1000 ml  Net 230.03 ml   Filed Weights   05/02/20 1410  Weight: 82.6 kg     Data Reviewed:   CBC: Recent Labs  Lab 04/30/20 0817 05/02/20 1524 05/03/20 0609  WBC 6.0 4.6 3.8*  NEUTROABS 4.6  --   --   HGB 9.6* 9.3* 7.9*  HCT 29.5* 28.8* 24.3*  MCV 88.1 90.0 90.3  PLT 144* 129* 91*   Basic Metabolic Panel: Recent Labs  Lab 04/30/20 0817 05/02/20 1524 05/03/20 0609  NA 138 139 140  K 3.3* 2.9* 3.5  CL 103 103 106  CO2 19* 20* 22  GLUCOSE 102* 103* 98  BUN 10 13 11   CREATININE 0.72 0.78 0.67  CALCIUM 8.1* 7.9* 7.5*  MG  --  2.1  --   PHOS  --  3.0  --    GFR: Estimated Creatinine Clearance: 76.3 mL/min (by C-G formula based on SCr of 0.67 mg/dL). Liver Function Tests: Recent Labs  Lab 04/30/20 0817 05/02/20 1524 05/03/20 0609  AST 101* 129* 107*  ALT 36 43 37  ALKPHOS 205* 194* 148*  BILITOT 1.7* 2.6* 2.5*  2.4*  PROT 5.1* 5.4* 4.6*  ALBUMIN 2.9* 3.1* 2.6*   Recent Labs  Lab 05/02/20 1524   LIPASE 69*   No results for input(s): AMMONIA in the last 168 hours. Coagulation Profile: No results for input(s): INR, PROTIME in the last 168 hours. Cardiac Enzymes: No results for input(s): CKTOTAL, CKMB, CKMBINDEX, TROPONINI in the last 168 hours. BNP (last 3 results) No results for input(s): PROBNP in the last 8760 hours. HbA1C: No results for input(s): HGBA1C in the last 72 hours. CBG: No results for input(s): GLUCAP in the last 168 hours. Lipid Profile: No results for input(s): CHOL, HDL, LDLCALC, TRIG, CHOLHDL, LDLDIRECT in the last 72 hours. Thyroid Function Tests: No results for input(s): TSH, T4TOTAL, FREET4, T3FREE, THYROIDAB in the last 72 hours. Anemia Panel: No results for input(s): VITAMINB12, FOLATE, FERRITIN, TIBC, IRON, RETICCTPCT in the last 72 hours. Sepsis Labs: Recent Labs  Lab 05/02/20 1524  LATICACIDVEN 4.2*    Recent  Results (from the past 240 hour(s))  Culture, Urine     Status: Abnormal   Collection Time: 04/30/20  1:00 PM   Specimen: Urine, Clean Catch  Result Value Ref Range Status   Specimen Description   Final    URINE, CLEAN CATCH Performed at [facility], [address] [place], [place] [address]    Special Requests   Final    NONE Performed at [facility], [place] [address], [place], [place] [address]    Culture 20,000 COLONIES/mL ENTEROCOCCUS FAECALIS (A)  Final   Report Status 05/02/2020 FINAL  Final   Organism ID, Bacteria ENTEROCOCCUS FAECALIS (A)  Final      Susceptibility   Enterococcus faecalis - MIC*    AMPICILLIN <=2 SENSITIVE Sensitive     NITROFURANTOIN <=16 SENSITIVE Sensitive     VANCOMYCIN 2 SENSITIVE Sensitive     * 20,000 COLONIES/mL ENTEROCOCCUS FAECALIS  SARS CORONAVIRUS 2 (TAT 6-24 HRS) Nasopharyngeal Nasopharyngeal Swab     Status: None   Collection Time: 05/02/20  7:15 PM   Specimen: Nasopharyngeal Swab  Result Value Ref Range Status   SARS Coronavirus 2  NEGATIVE NEGATIVE Final    Comment: (NOTE) SARS-CoV-2 target nucleic acids are NOT DETECTED.  The SARS-CoV-2 RNA is generally detectable in upper and lower respiratory specimens during the acute phase of infection. Negative results do not preclude SARS-CoV-2 infection, do not rule out co-infections with other pathogens, and should not be used as the sole basis for treatment or other patient management decisions. Negative results must be combined with clinical observations, patient history, and epidemiological information. The expected result is Negative.  Fact Sheet for Patients: SugarRoll.be  Fact Sheet for Healthcare Providers: https://www.woods-mathews.com/  This test is not yet approved or cleared by the [place] FDA and  has been authorized for detection and/or diagnosis of SARS-CoV-2 by FDA under an Emergency Use Authorization (EUA). This EUA will remain  in effect (meaning this test can be used) for the duration of the COVID-19 declaration under Se ction 564(b)(1) of the Act, 21 U.S.C. section 360bbb-3(b)(1), unless the authorization is terminated or revoked sooner.  Performed at [facility], [place] [address], [place], [place] [address]          Radiology Studies: DG Abd 1 View  Result Date: 05/02/2020 CLINICAL DATA:  The joint with nausea and vomiting. EXAM: ABDOMEN - 1 VIEW COMPARISON:  Radiograph 04/20/2020, but CT 03/05/2020 FINDINGS: No evidence of free air on this supine view. Slight increased air throughout not abnormally distended colon. Probable barium in the appendix. There is a 2.2 cm stone in the region of the right renal pelvis. Granulomatous calcifications of the liver and spleen. Cholecystectomy clips in the right upper quadrant. Known osseous metastatic disease on prior PET is not well visualized by radiograph. IMPRESSION: 1. Nonobstructive bowel gas pattern. Mild increased air throughout the colon without  abnormal distension is nonspecific in the setting. 2. Right renal stone. Electronically Signed   By: Keith Rake M.D.   On: 05/02/2020 20:13   CT Head Wo Contrast  Result Date: 05/02/2020 CLINICAL DATA:  70 year old female with metastatic breast cancer. Evaluate for brain metastases. EXAM: CT HEAD WITHOUT CONTRAST TECHNIQUE: Contiguous axial images were obtained from the base of the skull through the vertex without intravenous contrast. COMPARISON:  03/26/2020 brain MR FINDINGS: Brain: Parasagittal mass along the mid falx (series 2: Image 22-23) does not appear significantly changed from 03/26/2020 MR. The LEFT  cerebellar lesion identified on recent MR is difficult to visualize on this study. No new masses/metastatic disease is identified on this noncontrast study. There is no evidence of acute infarct, midline shift, hydrocephalus or hemorrhage. Vascular: No hyperdense vessel or unexpected calcification. Skull: The known skull lesions identified on recent MR are difficult to visualize on this study. Sinuses/Orbits: No acute abnormality Other: None IMPRESSION: 1. No acute abnormality. No new masses/metastatic disease on this noncontrast study. 2. No significant change in parafalcine mass/metastasis. 3. The LEFT cerebellar lesion and skull lesions identified on recent MR are difficult to visualize on this study. Electronically Signed   By: Margarette Canada M.D.   On: 05/02/2020 16:01   DG Chest Port 1 View  Result Date: 05/02/2020 CLINICAL DATA:  Weakness.  Nausea. EXAM: PORTABLE CHEST 1 VIEW COMPARISON:  Chest CT 02/27/2020 FINDINGS: Tip of the right upper extremity PICC in the SVC. Heart is borderline enlarged. Mild hilar prominence which may be due to adenopathy. There also calcified mediastinal and left hilar nodes. Many of the pulmonary nodules on prior CT are not seen. There is a calcified granuloma at the left lung base. No confluent consolidation. No pleural fluid. No pneumothorax. Postsurgical change  of the left axilla and chest wall. Known osseous metastatic disease is not well detected by radiograph. IMPRESSION: 1. No acute findings. 2. Bilateral hilar prominence may be due to adenopathy in this patient with known metastatic breast cancer. 3. Right upper extremity PICC tip in the SVC. Electronically Signed   By: Keith Rake M.D.   On: 05/02/2020 15:08        Scheduled Meds: . Chlorhexidine Gluconate Cloth  6 each Topical Daily  . losartan  12.5 mg Oral Daily  . metoCLOPramide (REGLAN) injection  10 mg Intravenous Q8H  . pantoprazole (PROTONIX) IV  40 mg Intravenous Q24H   Continuous Infusions: . sodium chloride 75 mL/hr at 05/02/20 2017  . ampicillin (OMNIPEN) IV 1 g (05/03/20 0548)     LOS: 0 days   Time spent= 35 mins   Dede Query, RN, NP-student   If 7PM-7AM, please contact night-coverage  05/03/2020, 10:43 AM

## 2020-05-04 ENCOUNTER — Inpatient Hospital Stay: Payer: Medicare PPO

## 2020-05-04 ENCOUNTER — Other Ambulatory Visit: Payer: Self-pay | Admitting: Oncology

## 2020-05-04 ENCOUNTER — Inpatient Hospital Stay (HOSPITAL_COMMUNITY): Payer: Medicare PPO

## 2020-05-04 DIAGNOSIS — R627 Adult failure to thrive: Secondary | ICD-10-CM | POA: Diagnosis not present

## 2020-05-04 DIAGNOSIS — Z515 Encounter for palliative care: Secondary | ICD-10-CM

## 2020-05-04 DIAGNOSIS — E876 Hypokalemia: Secondary | ICD-10-CM | POA: Diagnosis not present

## 2020-05-04 DIAGNOSIS — C7800 Secondary malignant neoplasm of unspecified lung: Secondary | ICD-10-CM

## 2020-05-04 DIAGNOSIS — Z7189 Other specified counseling: Secondary | ICD-10-CM | POA: Diagnosis not present

## 2020-05-04 DIAGNOSIS — R531 Weakness: Secondary | ICD-10-CM | POA: Diagnosis not present

## 2020-05-04 DIAGNOSIS — C50812 Malignant neoplasm of overlapping sites of left female breast: Secondary | ICD-10-CM

## 2020-05-04 DIAGNOSIS — R39198 Other difficulties with micturition: Secondary | ICD-10-CM

## 2020-05-04 DIAGNOSIS — R1319 Other dysphagia: Secondary | ICD-10-CM

## 2020-05-04 DIAGNOSIS — C787 Secondary malignant neoplasm of liver and intrahepatic bile duct: Secondary | ICD-10-CM

## 2020-05-04 DIAGNOSIS — E860 Dehydration: Secondary | ICD-10-CM | POA: Diagnosis not present

## 2020-05-04 DIAGNOSIS — Z170 Estrogen receptor positive status [ER+]: Secondary | ICD-10-CM

## 2020-05-04 DIAGNOSIS — R112 Nausea with vomiting, unspecified: Secondary | ICD-10-CM

## 2020-05-04 DIAGNOSIS — R1310 Dysphagia, unspecified: Secondary | ICD-10-CM

## 2020-05-04 LAB — COMPREHENSIVE METABOLIC PANEL
ALT: 39 U/L (ref 0–44)
AST: 103 U/L — ABNORMAL HIGH (ref 15–41)
Albumin: 2.6 g/dL — ABNORMAL LOW (ref 3.5–5.0)
Alkaline Phosphatase: 169 U/L — ABNORMAL HIGH (ref 38–126)
Anion gap: 15 (ref 5–15)
BUN: 10 mg/dL (ref 8–23)
CO2: 20 mmol/L — ABNORMAL LOW (ref 22–32)
Calcium: 7.6 mg/dL — ABNORMAL LOW (ref 8.9–10.3)
Chloride: 105 mmol/L (ref 98–111)
Creatinine, Ser: 0.63 mg/dL (ref 0.44–1.00)
GFR, Estimated: >60 mL/min (ref >60)
Glucose, Bld: 97 mg/dL (ref 70–99)
Potassium: 3.4 mmol/L — ABNORMAL LOW (ref 3.5–5.1)
Sodium: 140 mmol/L (ref 135–145)
Total Bilirubin: 1.9 mg/dL — ABNORMAL HIGH (ref 0.3–1.2)
Total Protein: 4.5 g/dL — ABNORMAL LOW (ref 6.5–8.1)

## 2020-05-04 LAB — CBC
HCT: 25.3 % — ABNORMAL LOW (ref 36.0–46.0)
Hemoglobin: 8.1 g/dL — ABNORMAL LOW (ref 12.0–15.0)
MCH: 29.5 pg (ref 26.0–34.0)
MCHC: 32 g/dL (ref 30.0–36.0)
MCV: 92 fL (ref 80.0–100.0)
Platelets: 94 10*3/uL — ABNORMAL LOW (ref 150–400)
RBC: 2.75 MIL/uL — ABNORMAL LOW (ref 3.87–5.11)
RDW: 24.1 % — ABNORMAL HIGH (ref 11.5–15.5)
WBC: 3.7 10*3/uL — ABNORMAL LOW (ref 4.0–10.5)
nRBC: 1.6 % — ABNORMAL HIGH (ref 0.0–0.2)

## 2020-05-04 MED ORDER — METOCLOPRAMIDE HCL 5 MG/ML IJ SOLN
10.0000 mg | Freq: Three times a day (TID) | INTRAMUSCULAR | Status: DC
  Administered 2020-05-04 – 2020-05-05 (×3): 10 mg via INTRAVENOUS
  Filled 2020-05-04 (×3): qty 2

## 2020-05-04 MED ORDER — POTASSIUM CHLORIDE CRYS ER 20 MEQ PO TBCR
40.0000 meq | EXTENDED_RELEASE_TABLET | Freq: Once | ORAL | Status: DC
  Filled 2020-05-04: qty 2

## 2020-05-04 MED ORDER — LORAZEPAM 2 MG/ML IJ SOLN
0.5000 mg | Freq: Once | INTRAMUSCULAR | Status: AC
  Administered 2020-05-04: 0.5 mg via INTRAVENOUS
  Filled 2020-05-04: qty 1

## 2020-05-04 MED ORDER — ONDANSETRON HCL 4 MG/2ML IJ SOLN
4.0000 mg | Freq: Three times a day (TID) | INTRAMUSCULAR | Status: DC
  Administered 2020-05-04 – 2020-05-12 (×26): 4 mg via INTRAVENOUS
  Filled 2020-05-04 (×26): qty 2

## 2020-05-04 MED ORDER — GADOBUTROL 1 MMOL/ML IV SOLN
8.0000 mL | Freq: Once | INTRAVENOUS | Status: AC | PRN
  Administered 2020-05-04: 8 mL via INTRAVENOUS

## 2020-05-04 MED ORDER — DEXAMETHASONE SODIUM PHOSPHATE 4 MG/ML IJ SOLN
4.0000 mg | Freq: Two times a day (BID) | INTRAMUSCULAR | Status: DC
  Administered 2020-05-05 – 2020-05-07 (×6): 4 mg via INTRAVENOUS
  Filled 2020-05-04 (×6): qty 1

## 2020-05-04 MED ORDER — PROCHLORPERAZINE EDISYLATE 10 MG/2ML IJ SOLN: 10.0000 mg | Freq: Four times a day (QID) | INTRAMUSCULAR | Status: DC | PRN

## 2020-05-04 MED ORDER — DIAZEPAM 5 MG PO TABS: 5.0000 mg | ORAL_TABLET | ORAL | Status: DC

## 2020-05-04 MED ORDER — POTASSIUM CHLORIDE 10 MEQ/100ML IV SOLN
10.0000 meq | Freq: Once | INTRAVENOUS | Status: AC
  Administered 2020-05-04: 10 meq via INTRAVENOUS
  Filled 2020-05-04: qty 100

## 2020-05-04 NOTE — Progress Notes (Addendum)
HEMATOLOGY-ONCOLOGY PROGRESS NOTE  Patient Care Team: [name], MD as PCP - General (Family Medicine) Rolm Bookbinder, MD as Attending Physician (Dermatology) Garnett Rekowski, Virgie Dad, MD as Consulting Physician (Oncology) Kyung Rudd, MD as Consulting Physician (Radiation Oncology) Jovita Kussmaul, MD as Consulting Physician (General Surgery) Irene Limbo, MD as Consulting Physician (Plastic Surgery) Delice Bison, Charlestine Massed, NP as Nurse Practitioner (Hematology and Oncology) [name], MD as Consulting Physician (Obstetrics and Gynecology) Laurey Morale, MD as Consulting Physician (Family Medicine) OTHER MD:  SUBJECTIVE: Brooke [name] was admitted to the hospital due to weakness, nausea, vomiting.  She is also being treated for UTI that was diagnosed as an outpatient but the patient was unable to start her antibiotics because she was unable to pick it up.  This morning, Brooke [name] is having ongoing nausea and vomiting.  She is try to take an ice chips the time my visit and is gagging on them.  She is having abdominal discomfort.  Currently, the patient has scheduled Reglan with orders for as needed Zofran and Ativan.  However, she is not using her as needed antiemetics. She is not really able to walk.  Oncology History Overview Note  Negative genetic testing   Malignant neoplasm of overlapping sites of left breast in female, estrogen receptor positive ([place])  01/26/2017 Initial Diagnosis   in the left breast, a cT2 pN1 invasive ductal carcinoma (with some lobular features but E-cadherin positive), grade 2, estrogen and progesterone receptor positive, HER-2 not amplified, with an MIB-1 of 15-20%             (a) breast MRI 02/04/2017 suggests a T3 N1 tumor   02/01/2017 Cancer Staging   Staging form: Breast, AJCC 8th Edition - Clinical stage from 02/01/2017: Stage IV (cT3, cN1, cM1, G2, ER+, PR+, HER2-) - Signed by Chauncey Cruel, MD on 03/05/2020 Staging comments: Staged at breast  conference on 12.5.18   03/03/2017 - 08/01/2017 Neo-Adjuvant Chemotherapy   started neoadjuvant cyclophosphamide/docetaxel 03/03/2017, discontinued after 1 cycle with very poor tolerance             (a) started cyclophosphamide/methotrexate/fluorouracil (CMF) 03/28/2017, repeated x7 cycles, last dose 08/01/2017   09/25/2017 Surgery   in the right breast, a complex sclerosing lesion, status post lumpectomy, with no malignancy identified   09/25/2017 Surgery   status post left lumpectomy and axillary lymph node dissection 09/25/17 for a ypT3 ypN2, stage IIIA invasive ductal carcinoma, grade 2, again estrogen and progesterone receptor positive, HER-2 not amplified   10/11/2017 Cancer Staging   Staging form: Breast, AJCC 8th Edition - Pathologic: No Stage Recommended (ypT3, pN2a, cM1, G2, ER+, PR+, HER2-) - Signed by Chauncey Cruel, MD on 03/05/2020   11/07/2017 - 12/21/2017 Radiation Therapy   adjuvant radiation completed 12/21/2017             (a) capecitabine sensitization tolerated only the initial week of radiation (b) Site/dose: The patient initially received a dose of 50.4 Gy in 28 fractions to the left chest wall and left supraclavicular region. This was delivered using a 3-D conformal, 4 field technique. The patient then received a boost to the mastectomy scar. This delivered an additional 10 Gy in 5 fractions using an en face electron field. The total dose was 60.4 Gy.     12/12/2017 Genetic Testing   Negative genetic testing on the Multi-cancer panel.  The Multi-Gene Panel offered by [facility] includes sequencing and/or deletion duplication testing of the following 84 genes: AIP, ALK, APC, ATM, AXIN2,BAP1,  BARD1, BLM, BMPR1A, BRCA1, BRCA2, BRIP1, CASR, CDC73, CDH1, CDK4, CDKN1B, CDKN1C, CDKN2A (p14ARF), CDKN2A (p16INK4a), CEBPA, CHEK2, CTNNA1, DICER1, DIS3L2, EGFR (c.2369C>T, p.Thr790Met variant only), EPCAM (Deletion/duplication testing only), FH, FLCN, GATA2, GPC3, GREM1 (Promoter region  deletion/duplication testing only), HOXB13 (c.251G>A, p.Gly84Glu), HRAS, KIT, MAX, MEN1, MET, MITF (c.952G>A, p.Glu318Lys variant only), MLH1, MSH2, MSH3, MSH6, MUTYH, NBN, NF1, NF2, NTHL1, PALB2, PDGFRA, PHOX2B, PMS2, POLD1, POLE, POT1, PRKAR1A, PTCH1, PTEN, RAD50, RAD51C, RAD51D, RB1, RECQL4, RET, RUNX1, SDHAF2, SDHA (sequence changes only), SDHB, SDHC, SDHD, SMAD4, SMARCA4, SMARCB1, SMARCE1, STK11, SUFU, TERC, TERT, TMEM127, TP53, TSC1, TSC2, VHL, WRN and WT1.  The report date is 12/12/2017.   02/2018 -  Anti-estrogen oral therapy   Tamoxifen daily    Miscellaneous   (8) Foundation 1 testing shows stable microsatellite status and 1 mutation/ Mb, TP53 mutations x2             (a) PD-L1 dated July 2019 negative     03/30/2020 -  Chemotherapy    Patient is on Treatment Plan: BREAST PACLITAXEL D1,8,15 Q28D      Port-A-Cath in place  Liver metastases ([place])  03/05/2020 Initial Diagnosis   Liver metastases ([place])   03/30/2020 -  Chemotherapy    Patient is on Treatment Plan: BREAST PACLITAXEL D1,8,15 Q28D      Lung metastases ([place])  03/05/2020 Initial Diagnosis   Lung metastases ([place])   03/30/2020 -  Chemotherapy    Patient is on Treatment Plan: BREAST PACLITAXEL D1,8,15 Q28D         REVIEW OF SYSTEMS:   A 14 [name] review of systems is noncontributory except as noted in the HPI.  Past Medical History:  Diagnosis Date   Anemia    Anxiety    Arthritis    ASCUS of cervix with negative high risk HPV 12/2018   Bilateral dry eyes    Breast cancer, left breast (Brooke [name])    Family history of breast cancer 02/02/2017   Family history of breast cancer    Family history of colon cancer 02/02/2017   Family history of colon cancer    Family history of prostate cancer    Family history of prostate cancer in father 02/02/2017   Family history of uterine cancer    Fibroid    History of kidney stones    Hyperlipidemia    Hypertension    Melanoma of upper arm (Brooke [name]) 2009   RIGHT ARM    Obesity  (BMI 30.0-34.9)    Osteopenia 09/2017   T score -1.9 max 8% / 0.8% statistically significant decline at right and left hip stable at the spine   PONV (postoperative nausea and vomiting)    Squamous carcinoma     of the skin   UTI (urinary tract infection)    Varicose veins of both lower extremities    Vitamin D deficiency 07/2009   LOW VITAMIN D 29   Vitamin deficiency 07/2008   VITAMIN D LOW 23   Past Surgical History:  Procedure Laterality Date   ABDOMINAL SURGERY  1975   ovairan cyst    APPENDECTOMY     BREAST LUMPECTOMY WITH RADIOACTIVE SEED LOCALIZATION Right 09/25/2017   Procedure: RIGHT BREAST LUMPECTOMY WITH RADIOACTIVE SEED LOCALIZATION;  Surgeon: Jovita Kussmaul, MD;  Location: [facility];  Service: General;  Laterality: Right;   BREAST SURGERY     bio   CHOLECYSTECTOMY     DERMOID CYST  EXCISION     DILATATION & CURETTAGE/HYSTEROSCOPY WITH MYOSURE N/A 04/23/2019  Procedure: [place];  Surgeon: [name], MD;  Location: [facility];  Service: Gynecology;  Laterality: N/A;  request to follow at 9:30am in [facility] block requests 45 minutes   [place]     HYSTEROSCOPY     insertion  port a cath  03/02/2016   MASTECTOMY MODIFIED RADICAL Left 09/25/2017   Procedure: LEFT MODIFIED RADICAL MASTECTOMY;  Surgeon: Jovita Kussmaul, MD;  Location: [facility];  Service: General;  Laterality: Left;   PORTACATH PLACEMENT N/A 03/02/2017   Procedure: INSERTION PORT-A-CATH;  Surgeon: Jovita Kussmaul, MD;  Location: [facility];  Service: General;  Laterality: N/A;   SKIN BIOPSY     SKIN CANCER EXCISION  2010,2011   2010-RIGHT ARM, 2011-LEFT LOWER LEG.-DR Rolm Bookbinder DERM. DR. Sarajane Jews IS HER DERM SURGEON.   FAMILY HISTORY      Family History  Problem Relation Age of Onset   Diabetes Father     Hypertension Father     Heart disease Father     Prostate cancer Father 77   Arthritis  Father     Breast cancer Sister 2        again at age 90, GT results unk   Skin cancer Sister     Colon cancer Paternal Grandfather 2   Hypertension Mother     Heart failure Mother     Uterine cancer Sister 20        stage IV   Breast cancer Cousin          mat first cousin dx under 40  The patient's father was diagnosed with prostate cancer in his 67s and died at age 17 from complications of that disease.  The patient's mother died at age 16 with congestive heart failure.  The patient had no brothers, 3 sisters.  One sister had breast cancer at age 88 and again at age 87.  She has been genetically tested and was found to be BRCA not mutated in addition a paternal grandfather had colon cancer in his 6s.  There is no history of ovarian cancer in the family to the patient's knowledge.     GYNECOLOGIC HISTORY:  Patient's last menstrual period was 03/18/1997. Menarche age 39. The patient is GX P0.  She stopped having periods in 1999 and took hormone replacement approximately 5 years.     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  She is now retired.  She is single and lives alone with no pets.                    ADVANCED DIRECTIVES: Not in place     HEALTH MAINTENANCE: Social History        Tobacco Use   Smoking status: Never Smoker   Smokeless tobacco: Never Used  Vaping Use   Vaping Use: Never used  Substance Use Topics   Alcohol use: No   Drug use: No                Colonoscopy: Never             PAP: 12/2019, negative             Bone density: 2017/osteopenia  Allergies  Allergen Reactions   Neosporin [Neomycin-Bacitracin Zn-Polymyx] Swelling, Rash and Other (See Comments)    Blisters   Bactrim [  Sulfamethoxazole-Trimethoprim]     Made her feel very sick   Benzalkonium Chloride Other (See Comments)    Redness (Pt is unaware)  It works by killing  microorganisms and inhibiting their future growth, and for this reason frequently appears as an ingredient in antibacterial hand wipes, antiseptic creams and anti-itch ointments.   Latex Rash    PHYSICAL EXAMINATION: ECOG PERFORMANCE STATUS: 3 - Symptomatic, >50% confined to bed  Vitals:   05/03/20 1939 05/04/20 0557  BP: (!) 135/54 (!) 160/54  Pulse: (!) 102 98  Resp: 14 20  Temp: 98.2 F (36.8 C) 98.3 F (36.8 C)  SpO2: 92% 91%   Filed Weights   05/02/20 1410  Weight: 82.6 kg    Intake/Output from previous day: 03/06 0701 - 03/07 0700 In: 1996.5 [I.V.:1596.5; IV Piggyback:400] Out: 325 [Urine:325]  GENERAL: Chronically ill-appearing female, gagging at times due to nausea SKIN: skin color, texture, turgor are normal, no rashes or significant lesions OROPHARYNX: No thrush or mucositis LUNGS: clear to auscultation and percussion with normal breathing effort HEART: regular rate & rhythm and no murmurs, pitting edema to the bilateral lower extremities ABDOMEN: Positive bowel sounds, tenderness with palpation  NEURO: alert & oriented x 3 with fluent speech, no focal motor/sensory deficits  LABORATORY DATA:  I have reviewed the data as listed CMP Latest Ref Rng & Units 05/04/2020 05/03/2020 05/03/2020  Glucose 70 - 99 mg/dL 97 98 -  BUN 8 - 23 mg/dL 10 11 -  Creatinine 0.44 - 1.00 mg/dL 0.63 0.67 -  Sodium 135 - 145 mmol/L 140 140 -  Potassium 3.5 - 5.1 mmol/L 3.4(L) 3.5 -  Chloride 98 - 111 mmol/L 105 106 -  CO2 22 - 32 mmol/L 20(L) 22 -  Calcium 8.9 - 10.3 mg/dL 7.6(L) 7.5(L) -  Total Protein 6.5 - 8.1 g/dL 4.5(L) 4.6(L) -  Total Bilirubin 0.3 - 1.2 mg/dL 1.9(H) 2.5(H) 2.4(H)  Alkaline Phos 38 - 126 U/L 169(H) 148(H) -  AST 15 - 41 U/L 103(H) 107(H) -  ALT 0 - 44 U/L 39 37 -    Lab Results  Component Value Date   WBC 3.7 (L) 05/04/2020   HGB 8.1 (L) 05/04/2020   HCT 25.3 (L) 05/04/2020   MCV 92.0 05/04/2020   PLT 94 (L) 05/04/2020   NEUTROABS 4.6 04/30/2020    DG  Abd 1 View  Result Date: 05/02/2020 CLINICAL DATA:  The joint with nausea and vomiting. EXAM: ABDOMEN - 1 VIEW COMPARISON:  Radiograph 04/20/2020, but CT 03/05/2020 FINDINGS: No evidence of free air on this supine view. Slight increased air throughout not abnormally distended colon. Probable barium in the appendix. There is a 2.2 cm stone in the region of the right renal pelvis. Granulomatous calcifications of the liver and spleen. Cholecystectomy clips in the right upper quadrant. Known osseous metastatic disease on prior PET is not well visualized by radiograph. IMPRESSION: 1. Nonobstructive bowel gas pattern. Mild increased air throughout the colon without abnormal distension is nonspecific in the setting. 2. Right renal stone. Electronically Signed   By: Keith Rake M.D.   On: 05/02/2020 20:13   CT Head Wo Contrast  Result Date: 05/02/2020 CLINICAL DATA:  70 year old female with metastatic breast cancer. Evaluate for brain metastases. EXAM: CT HEAD WITHOUT CONTRAST TECHNIQUE: Contiguous axial images were obtained from the base of the skull through the vertex without intravenous contrast. COMPARISON:  03/26/2020 brain MR FINDINGS: Brain: Parasagittal mass along the mid falx (series 2: Image 22-23) does not appear  significantly changed from 03/26/2020 MR. The LEFT cerebellar lesion identified on recent MR is difficult to visualize on this study. No new masses/metastatic disease is identified on this noncontrast study. There is no evidence of acute infarct, midline shift, hydrocephalus or hemorrhage. Vascular: No hyperdense vessel or unexpected calcification. Skull: The known skull lesions identified on recent MR are difficult to visualize on this study. Sinuses/Orbits: No acute abnormality Other: None IMPRESSION: 1. No acute abnormality. No new masses/metastatic disease on this noncontrast study. 2. No significant change in parafalcine mass/metastasis. 3. The LEFT cerebellar lesion and skull lesions  identified on recent MR are difficult to visualize on this study. Electronically Signed   By: Margarette Canada M.D.   On: 05/02/2020 16:01   DG Chest Port 1 View  Result Date: 05/02/2020 CLINICAL DATA:  Weakness.  Nausea. EXAM: PORTABLE CHEST 1 VIEW COMPARISON:  Chest CT 02/27/2020 FINDINGS: Tip of the right upper extremity PICC in the SVC. Heart is borderline enlarged. Mild hilar prominence which may be due to adenopathy. There also calcified mediastinal and left hilar nodes. Many of the pulmonary nodules on prior CT are not seen. There is a calcified granuloma at the left lung base. No confluent consolidation. No pleural fluid. No pneumothorax. Postsurgical change of the left axilla and chest wall. Known osseous metastatic disease is not well detected by radiograph. IMPRESSION: 1. No acute findings. 2. Bilateral hilar prominence may be due to adenopathy in this patient with known metastatic breast cancer. 3. Right upper extremity PICC tip in the SVC. Electronically Signed   By: Keith Rake M.D.   On: 05/02/2020 15:08   DG Abdomen Acute W/Chest  Result Date: 04/20/2020 CLINICAL DATA:  Vomiting cancer EXAM: DG ABDOMEN ACUTE WITH 1 VIEW CHEST COMPARISON:  CT chest 02/27/2020, PET CT 03/05/2020 FINDINGS: Single view chest demonstrates right upper extremity central venous catheter tip at the cavoatrial junction. Postsurgical changes of left chest and axilla. No focal opacity or pleural effusion. Normal cardiomediastinal silhouette. Supine and upright views of the abdomen demonstrate no free air beneath the diaphragm. Surgical clips in the right upper quadrant. Multiple round calcifications in the left upper quadrant consistent with splenic granuloma. Calcified left lung base granuloma. Nonobstructed gas pattern. Staghorn calculus right kidney. Known skeletal metastatic disease is better seen on PET CT. IMPRESSION: Nonobstructed gas pattern. Staghorn calculus right kidney. No radiographic evidence for acute  cardiopulmonary abnormality. Electronically Signed   By: Donavan Foil M.D.   On: 04/20/2020 16:21   IR PICC PLACEMENT RIGHT >5 YRS INC IMG GUIDE  Result Date: 04/10/2020 INDICATION: Patient with history of metastatic breast cancer and poor venous access; central venous access requested for chemotherapy. EXAM: ULTRASOUND AND FLUOROSCOPIC GUIDED PICC LINE INSERTION MEDICATIONS: 1% lidocaine to skin and subcutaneous tissue ANESTHESIA/SEDATION: None FLUOROSCOPY TIME:  Fluoroscopy Time: 1 minutes 6 seconds (6 mGy). COMPLICATIONS: None immediate. TECHNIQUE: The procedure, risks, benefits, and alternatives were explained to the patient and informed written consent was obtained. A timeout was performed prior to the initiation of the procedure. The right upper extremity was prepped with chlorhexidine in a sterile fashion, and a sterile drape was applied covering the operative field. Maximum barrier sterile technique with sterile gowns and gloves were used for the procedure. A timeout was performed prior to the initiation of the procedure. Local anesthesia was provided with 1% lidocaine. Under direct ultrasound guidance, the right basilic vein was accessed with a micropuncture kit after the overlying soft tissues were anesthetized with 1% lidocaine. An ultrasound image  was saved for documentation purposes. A guidewire was advanced to the level of the superior caval-atrial junction for measurement purposes and the PICC line was cut to length. A peel-away sheath was placed and a 47 cm, 5 [place], dual lumen was inserted to level of the superior caval-atrial junction. A post procedure spot fluoroscopic was obtained. The catheter easily aspirated and flushed and was sutured in place. A dressing was placed. The patient tolerated the procedure well without immediate post procedural complication. FINDINGS: After catheter placement, the tip lies within the superior cavoatrial junction the catheter aspirates and flushes normally  and is ready for immediate use. IMPRESSION: Successful ultrasound and fluoroscopic guided placement of a right basilic vein approach, 47 cm, 5 French, dual lumen PICC with tip at the superior caval-atrial junction. The PICC line is ready for immediate use. Read by: Rowe Robert, PA-C Electronically Signed   By: Jacqulynn Cadet M.D.   On: 04/10/2020 09:42   DG ESOPHAGUS W SINGLE CM (SOL OR THIN BA)  Result Date: 04/23/2020 CLINICAL DATA:  Dysphagia. EXAM: ESOPHOGRAM/BARIUM SWALLOW TECHNIQUE: Single contrast examination was performed using  thin barium. FLUOROSCOPY TIME:  Radiation Exposure Index (if provided by the fluoroscopic device): 17.9 mGy. COMPARISON:  None. FINDINGS: Exam is limited as the patient only drank a small amount of contrast. No definite mass or stricture is noted in the esophagus small sliding-type hiatal hernia is noted. No definite reflux is noted. Barium tablet could not be administered as the patient could not be placed in upright position. IMPRESSION: Small sliding-type hiatal hernia. No definite mass or stricture is noted in the esophagus. Electronically Signed   By: Marijo Conception M.D.   On: 04/23/2020 09:15   ASSESSMENT: 70 y.o. [place] woman status post bilateral biopsies 01/26/2017, showing   (1) in the right breast, a complex sclerosing lesion, status post lumpectomy 09/25/2017, with no malignancy identified.   (2) in the left breast, a cT2 pN1 invasive ductal carcinoma (with some lobular features but E-cadherin positive), grade 2, estrogen and progesterone receptor positive, HER-2 not amplified, with an MIB-1 of 15-20%             (a) breast MRI 02/04/2017 suggests a T3 N1 tumor   (3) started neoadjuvant cyclophosphamide/docetaxel 03/03/2017, discontinued after 1 cycle with very poor tolerance             (a) started cyclophosphamide/methotrexate/fluorouracil (CMF) 03/28/2017, repeated x7 cycles, last dose 08/01/2017   (4) status post left modified radical  mastectomy 09/25/17 for a ypT5 ypN2, stage IIIA invasive ductal carcinoma, grade 2, again estrogen and progesterone receptor positive, HER-2 not amplified   (5) adjuvant radiation completed 12/21/2017             (a) capecitabine sensitization tolerated only the initial week of radiation (b) Site/dose: The patient initially received a dose of 50.4 Gy in 28 fractions to the left chest wall and left supraclavicular region. This was delivered using a 3-D conformal, 4 field technique. The patient then received a boost to the mastectomy scar. This delivered an additional 10 Gy in 5 fractions using an en face electron field. The total dose was 60.4 Gy.   ( 6) tamoxifen started 03/09/2018, discontinued January 2022 with metastatic progression   (7) genetics testing 12/12/2017 through the Multi-Gene Panel offered by [facility] found no deleterious mutations in AIP, ALK, APC, ATM, AXIN2,BAP1,  BARD1, BLM, BMPR1A, BRCA1, BRCA2, BRIP1, CASR, CDC73, CDH1, CDK4, CDKN1B, CDKN1C, CDKN2A (p14ARF), CDKN2A (p16INK4a), CEBPA, CHEK2, CTNNA1, DICER1, DIS3L2,  EGFR (c.2369C>T, p.Thr790Met variant only), EPCAM (Deletion/duplication testing only), FH, FLCN, GATA2, GPC3, GREM1 (Promoter region deletion/duplication testing only), HOXB13 (c.251G>A, p.Gly84Glu), HRAS, KIT, MAX, MEN1, MET, MITF (c.952G>A, p.Glu318Lys variant only), MLH1, MSH2, MSH3, MSH6, MUTYH, NBN, NF1, NF2, NTHL1, PALB2, PDGFRA, PHOX2B, PMS2, POLD1, POLE, POT1, PRKAR1A, PTCH1, PTEN, RAD50, RAD51C, RAD51D, RB1, RECQL4, RET, RUNX1, SDHAF2, SDHA (sequence changes only), SDHB, SDHC, SDHD, SMAD4, SMARCA4, SMARCB1, SMARCE1, STK11, SUFU, TERC, TERT, TMEM127, TP53, TSC1, TSC2, VHL, WRN and WT1.    (8) Foundation 1 testing shows stable microsatellite status and 1 mutation/ Mb, TP53 mutations x2             (a) PD-L1 dated July 2019 negative   METASTATIC DISEASE: January 2022 (9) CT angio of the chest 02/27/2020 shows multiple hilar nodes consistent with prior granulomatous  infection, multiple small pulmonary nodules, multiple punctate granulomas throughout the liver and spleen, nodular hepatic contour and areas of mottling possibly due to steatosis, 1.6 cm left adrenal nodule             (a) PET scan 03/05/2020 shows mildly to highly hypermetabolic adenopathy, the pulmonary nodules being mildly to not hypermetabolic, a large right hepatic liver lesion with an SUV of 13.2 and additional hypermetabolic right and left lobe foci, the adrenal lesion being hypermetabolic with an SUV of 16.  There are also peritoneal nodules and innumerable foci of bony involvement.  Incidental note was made of small heterogeneous foci of increased uptake in the cerebellum.  The CT images did not show gross abnormality in the visualized brain             (b) on 03/09/2020 CA 27-29 was 983.0, CEA 744.17, the CA 19-9 normal at 32             (c) liver biopsy 03/13/2020 confirms metastati carcinoma, estrogen receptor positive, progesterone and HER 2 negative, with an Mib-1 of 40%             (d) brain MRI 03/26/2020 shows dural involvement including a 1.0 cm dural-based cerebellar lesion   (10) paclitaxel started 03/30/2020, to be repeated weekly as tolerated   (11) zoledronate started 03/19/2020, to be repeated every 12 weeks   (12) foundation 1 pending on the most recent biopsy     PLAN: Alpa is currently admitted due to weakness and intractable nausea vomiting.  I have adjusted her antiemetics and have scheduled the Zofran 8 hours.  She also has scheduled Reglan.  She may continue the as needed Ativan and I have added as needed Compazine.  We need to get her nausea and vomiting under better control hopefully she can eat.  Averi's situation is complex.  She understands she has stage IV incurable cancer, and she understands that she may not have very long to live.  She is not ready to "give up" and does want continuing chemotherapy so long as we have any possibility of response.  She has  received 3 doses of weekly Taxol and the mass in the nape of her neck and also on top of her right eyelid for example are stable.  There has not been any rapid growth of those palpable tumors.    However, she is not eating or drinking very well at home is having increased difficulty getting around.  She has limited social support.  We would recommend consideration of SNF placement for her.  We would also recommend a palliative care consult to further engage in discussion of goals of care as  well as helping with symptom management.  We would recommend palliative care follow her upon discharge.   Agree with DNR/DNI.   LOS: 1 day   Mikey Bussing, DNP, AGPCNP-BC, AOCNP 05/04/20   ADDENDUM: Annaliyah is now day 5 cycle =dose #3 paclitaxel. She has tolerated the drug well, and holding the drug in the past did not improve her symptoms. She continues to have unexplained dysphagia and now also leg weakness and difficulty urinating. I am concerned she may have meningeal spread of her cancer. The brain MRI obtained in January did not show that though it did show dural spread.   I have written for MRIs of the cervical, thoracic and lumbar spine (was not able to find the "total spinal MRI" order). I am also starting her on dexamethasone IV BID to see if this improves her symptoms.If she does have meningeal spread of course her prognosis would be worse overall but we might be able to offer radiation for symptom palliation.  I greatly appreciate your care to this patient!  I personally saw this patient and performed a substantive portion of this encounter with the listed APP documented above.   Chauncey Cruel, MD Medical Oncology and Hematology [facility] [address] [place], [place] [address] Tel. [phone]    Fax. [phone]

## 2020-05-04 NOTE — Progress Notes (Signed)
PROGRESS NOTE    Brooke [name]  ZJQ:[id number] DOB: [birth year] DOA: 05/02/2020 PCP: [name], MD   Brief Narrative:   WIO:XBDZHGD is a 70 y.o.femalewith medical history significant ofmetastatic breast cancer, HTN, anxiety. Presenting with intractable N/V and generalized weakness.  She was admitted for similar presentation 2 weeks ago. Patient reports N/V and poor appetite for months. She has ongoing chemotherapy with Dr. [name].  Per report, she had a session this week.  She went for a PICC care session on 05/01/20 and reports resting in a chair at home afterwards.  She was unable to get up out of the chair and reports being stuck until someone came to help her the morning of 05/02/20. When she was noted to be very weak and debilitated and was taken to the ED.  Her oncologist diagnosed her with a UTI a few days prior to her hospital admission for which she received an antibiotics prescription.  However, she was unable to pick it up.  ED Course:In the ED was found to be nauseous. CT head was negative for any new changes and was afebrile. Her lab work was about stable from previous encounters. She was hypokalemic. She was given potassium and fluids. TRH was called for admission.   Assessment & Plan:   Active Problems:   Intractable nausea and vomiting   Intractable vomiting with nausea  Intractable N/V -Continue reglan, ativan, IVF, protonix -Clear liquids, advance as tolerated -Nausea today improved, Zofran IV 4 mg q8hrs and compazine IV 10 mg, q 6hrs, as needed for nausea and vomiting -KUB shows nonobstructive bowel gas pattern. Mild increased air throughout the colon without abnormal distension is nonspecific in the setting. Right renal stone present.  UTI -Urine culture positive for E. Faecalis, 20,000 colonies/ml -Started on ampicillin while susceptibilities pending. -Susceptibilities came back sensitive to ampicillin, continue IV 1g q6h   Acute urinary  retention -Patient unable to void last night, requiring I/O cath with 1,000 cc retained -Persistent inability/urge to urinate -Foley placed on 05/03/20  Hypokalemia -Potassium 2.9 on admission, repleted on 3/5-> 3.5, now 3.4, replete again today with Klor-Con 40 mEq, continue to trend.  Elevated LFTs -AST <2x normal limits129->103, ALT WNL -Direct Bilirubin 0.7, indirect bilirubin 1.7, total bilirubin 2.5  Lactic acidosis -Lactate 4.2->2.9, anion gap 15, IVF's given -Anion gap closed at 15, continue to trend  Anemia of chronic disease, baseline 9-10 -Likely due to malignancy, today Hbg 8.1 -Trend and transfuse for Hgb < 7  Deconditioning, debility r/t S4 breast cancer and acute illness -PT evaluation and treat  S4 Breast Cancer -Followed outpatient by Dr. [name]  -Per 3/3 note by Dr. [name]; he has notified her that her cancer is incurable and she does not have long to live. Patient expresses that she does not want to give up and wants to continue chemotherapy. Dr. [name] has referred her to palliative/hospice with further discussions planned with family regarding prognosis (05/12/20); for now, they are continuing her chemo treatments.  During her last hospitalization, she was made DNR following discussions with inpatient palliative care. -Palliative consulted. -Medical Oncology following. -SNF recommended at discharge.  DVT prophylaxis:TED Code Status:DNR Family Communication:Sister at bedside. Consults called:Onco notified Status is: Observation  The patient remains OBS appropriate and will d/c before 2 midnights.  Dispo: The patient is from:Home Anticipated d/c is to:SNF Patient currently is not medically stable to d/c. Difficult to place patient No   Body mass index is 26.88 kg/m.    Consultants:  Palliative  Procedures:   None  Antimicrobials:   Ampicillin   Subjective:  Patient  examined at bedside.  Patient denies nausea or vomiting today. Unable to void during the night and this am, with a Foley catheter placed O/N 3/6-3/7.  Asking about why she can't urinate on her own.   Examination:  General exam: Chronically ill appearing, NAD. Resting in bed. Respiratory system: Clear to auscultation. Respiratory effort normal. Cardiovascular system: S1 & S2 heard, RRR. No JVD, murmurs, rubs, gallops or clicks. +4 bilateral edema, chronic Gastrointestinal system: Abdomen is nondistended, soft and nontender. No organomegaly or masses felt. Normal bowel sounds heard. Central nervous system: Alert and oriented. No focal neurological deficits. Extremities: Symmetric 5 x 5 power. Skin: No rashes, lesions or ulcers Psychiatry: Judgement and insight appear normal. Mood & affect appropriate.    Objective: Vitals:   05/03/20 0618 05/03/20 1426 05/03/20 1939 05/04/20 0557  BP: (!) 139/53 (!) 144/59 (!) 135/54 (!) 160/54  Pulse: 100 (!) 101 (!) 102 98  Resp:   14 20  Temp: 98.1 F (36.7 C) 97.9 F (36.6 C) 98.2 F (36.8 C) 98.3 F (36.8 C)  TempSrc: Oral Oral Oral Oral  SpO2: 93% 91% 92% 91%  Weight:      Height:        Intake/Output Summary (Last 24 hours) at 05/04/2020 1229 Last data filed at 05/04/2020 0630 Gross per 24 hour  Intake 1996.53 ml  Output 325 ml  Net 1671.53 ml   Filed Weights   05/02/20 1410  Weight: 82.6 kg     Data Reviewed:   CBC: Recent Labs  Lab 04/30/20 0817 05/02/20 1524 05/03/20 0609 05/04/20 0527  WBC 6.0 4.6 3.8* 3.7*  NEUTROABS 4.6  --   --   --   HGB 9.6* 9.3* 7.9* 8.1*  HCT 29.5* 28.8* 24.3* 25.3*  MCV 88.1 90.0 90.3 92.0  PLT 144* 129* 91* 94*   Basic Metabolic Panel: Recent Labs  Lab 04/30/20 0817 05/02/20 1524 05/03/20 0609 05/04/20 0527  NA 138 139 140 140  K 3.3* 2.9* 3.5 3.4*  CL 103 103 106 105  CO2 19* 20* 22 20*  GLUCOSE 102* 103* 98 97  BUN 10 13 11 10   CREATININE 0.72 0.78 0.67 0.63  CALCIUM 8.1*  7.9* 7.5* 7.6*  MG  --  2.1  --   --   PHOS  --  3.0  --   --    GFR: Estimated Creatinine Clearance: 76.3 mL/min (by C-G formula based on SCr of 0.63 mg/dL). Liver Function Tests: Recent Labs  Lab 04/30/20 0817 05/02/20 1524 05/03/20 0609 05/04/20 0527  AST 101* 129* 107* 103*  ALT 36 43 37 39  ALKPHOS 205* 194* 148* 169*  BILITOT 1.7* 2.6* 2.5*  2.4* 1.9*  PROT 5.1* 5.4* 4.6* 4.5*  ALBUMIN 2.9* 3.1* 2.6* 2.6*   Recent Labs  Lab 05/02/20 1524  LIPASE 69*   No results for input(s): AMMONIA in the last 168 hours. Coagulation Profile: No results for input(s): INR, PROTIME in the last 168 hours. Cardiac Enzymes: No results for input(s): CKTOTAL, CKMB, CKMBINDEX, TROPONINI in the last 168 hours. BNP (last 3 results) No results for input(s): PROBNP in the last 8760 hours. HbA1C: No results for input(s): HGBA1C in the last 72 hours. CBG: No results for input(s): GLUCAP in the last 168 hours. Lipid Profile: No results for input(s): CHOL, HDL, LDLCALC, TRIG, CHOLHDL, LDLDIRECT in the last 72 hours. Thyroid Function Tests: No results for  input(s): TSH, T4TOTAL, FREET4, T3FREE, THYROIDAB in the last 72 hours. Anemia Panel: No results for input(s): VITAMINB12, FOLATE, FERRITIN, TIBC, IRON, RETICCTPCT in the last 72 hours. Sepsis Labs: Recent Labs  Lab 05/02/20 1524 05/03/20 1400  LATICACIDVEN 4.2* 2.9*    Recent Results (from the past 240 hour(s))  Culture, Urine     Status: Abnormal   Collection Time: 04/30/20  1:00 PM   Specimen: Urine, Clean Catch  Result Value Ref Range Status   Specimen Description   Final    URINE, CLEAN CATCH Performed at [name], [place] [address] [place], [place] [address]    Special Requests   Final    NONE Performed at [facility], [place] [address], [place], [place] [address]    Culture 20,000 COLONIES/mL ENTEROCOCCUS FAECALIS (A)  Final   Report Status 05/02/2020 FINAL  Final    Organism ID, Bacteria ENTEROCOCCUS FAECALIS (A)  Final      Susceptibility   Enterococcus faecalis - MIC*    AMPICILLIN <=2 SENSITIVE Sensitive     NITROFURANTOIN <=16 SENSITIVE Sensitive     VANCOMYCIN 2 SENSITIVE Sensitive     * 20,000 COLONIES/mL ENTEROCOCCUS FAECALIS  SARS CORONAVIRUS 2 (TAT 6-24 HRS) Nasopharyngeal Nasopharyngeal Swab     Status: None   Collection Time: 05/02/20  7:15 PM   Specimen: Nasopharyngeal Swab  Result Value Ref Range Status   SARS Coronavirus 2 NEGATIVE NEGATIVE Final    Comment: (NOTE) SARS-CoV-2 target nucleic acids are NOT DETECTED.  The SARS-CoV-2 RNA is generally detectable in upper and lower respiratory specimens during the acute phase of infection. Negative results do not preclude SARS-CoV-2 infection, do not rule out co-infections with other pathogens, and should not be used as the sole basis for treatment or other patient management decisions. Negative results must be combined with clinical observations, patient history, and epidemiological information. The expected result is Negative.  Fact Sheet for Patients: SugarRoll.be  Fact Sheet for Healthcare Providers: https://www.woods-mathews.com/  This test is not yet approved or cleared by the [place] FDA and  has been authorized for detection and/or diagnosis of SARS-CoV-2 by FDA under an Emergency Use Authorization (EUA). This EUA will remain  in effect (meaning this test can be used) for the duration of the COVID-19 declaration under Se ction 564(b)(1) of the Act, 21 U.S.C. section 360bbb-3(b)(1), unless the authorization is terminated or revoked sooner.  Performed at [facility], [place] [address], [place], [place] [address]          Radiology Studies: DG Abd 1 View  Result Date: 05/02/2020 CLINICAL DATA:  The joint with nausea and vomiting. EXAM: ABDOMEN - 1 VIEW COMPARISON:  Radiograph 04/20/2020, but CT 03/05/2020 FINDINGS: No  evidence of free air on this supine view. Slight increased air throughout not abnormally distended colon. Probable barium in the appendix. There is a 2.2 cm stone in the region of the right renal pelvis. Granulomatous calcifications of the liver and spleen. Cholecystectomy clips in the right upper quadrant. Known osseous metastatic disease on prior PET is not well visualized by radiograph. IMPRESSION: 1. Nonobstructive bowel gas pattern. Mild increased air throughout the colon without abnormal distension is nonspecific in the setting. 2. Right renal stone. Electronically Signed   By: Keith Rake M.D.   On: 05/02/2020 20:13   CT Head Wo Contrast  Result Date: 05/02/2020 CLINICAL DATA:  70 year old female with metastatic breast cancer. Evaluate for brain metastases. EXAM: CT HEAD WITHOUT  CONTRAST TECHNIQUE: Contiguous axial images were obtained from the base of the skull through the vertex without intravenous contrast. COMPARISON:  03/26/2020 brain MR FINDINGS: Brain: Parasagittal mass along the mid falx (series 2: Image 22-23) does not appear significantly changed from 03/26/2020 MR. The LEFT cerebellar lesion identified on recent MR is difficult to visualize on this study. No new masses/metastatic disease is identified on this noncontrast study. There is no evidence of acute infarct, midline shift, hydrocephalus or hemorrhage. Vascular: No hyperdense vessel or unexpected calcification. Skull: The known skull lesions identified on recent MR are difficult to visualize on this study. Sinuses/Orbits: No acute abnormality Other: None IMPRESSION: 1. No acute abnormality. No new masses/metastatic disease on this noncontrast study. 2. No significant change in parafalcine mass/metastasis. 3. The LEFT cerebellar lesion and skull lesions identified on recent MR are difficult to visualize on this study. Electronically Signed   By: Margarette Canada M.D.   On: 05/02/2020 16:01   DG Chest Port 1 View  Result Date:  05/02/2020 CLINICAL DATA:  Weakness.  Nausea. EXAM: PORTABLE CHEST 1 VIEW COMPARISON:  Chest CT 02/27/2020 FINDINGS: Tip of the right upper extremity PICC in the SVC. Heart is borderline enlarged. Mild hilar prominence which may be due to adenopathy. There also calcified mediastinal and left hilar nodes. Many of the pulmonary nodules on prior CT are not seen. There is a calcified granuloma at the left lung base. No confluent consolidation. No pleural fluid. No pneumothorax. Postsurgical change of the left axilla and chest wall. Known osseous metastatic disease is not well detected by radiograph. IMPRESSION: 1. No acute findings. 2. Bilateral hilar prominence may be due to adenopathy in this patient with known metastatic breast cancer. 3. Right upper extremity PICC tip in the SVC. Electronically Signed   By: Keith Rake M.D.   On: 05/02/2020 15:08        Scheduled Meds: . Chlorhexidine Gluconate Cloth  6 each Topical Daily  . losartan  12.5 mg Oral Daily  . metoCLOPramide (REGLAN) injection  10 mg Intravenous Q8H  . ondansetron (ZOFRAN) IV  4 mg Intravenous Q8H  . pantoprazole (PROTONIX) IV  40 mg Intravenous Q24H   Continuous Infusions: . sodium chloride 75 mL/hr at 05/04/20 0315  . ampicillin (OMNIPEN) IV 1 g (05/04/20 0536)     LOS: 1 day   Time spent= 35 mins   Dede Query, RN, NP Student   If 7PM-7AM, please contact night-coverage  05/04/2020, 12:29 PM

## 2020-05-04 NOTE — TOC Initial Note (Addendum)
Transition of Care [facility]) - Initial/Assessment Note    Patient Details  Name: Brooke [name] MRN: [id number] Date of Birth: Aug 21, 1950  [facility] CM/SW Contact:    Lynnell Catalan, RN Phone Number: 05/04/2020, 3:54 PM  Clinical Narrative:                 [facility] consult for SNF placement. Spoke with pt at bedside who states she would really like to go home but that her family would not be there until 3/13. She gave this CM permission to fax out FL2 to see what option of SNF beds are available. Will fax out FL2 and continue with disposition planning.   Pasrr received:  [phone] [id number]  Expected Discharge Plan: [place] Barriers to Discharge: Continued Medical Work up  Expected Discharge Plan and Services Expected Discharge Plan: [place]     Prior Living Arrangements/Services   Lives with:: Self Patient language and need for interpreter reviewed:: Yes        Need for Family Participation in Patient Care: Yes (Comment) Care giver support system in place?: Yes (comment)   Criminal Activity/Legal Involvement Pertinent to Current Situation/Hospitalization: No - Comment as needed  Activities of Daily Living Home Assistive Devices/Equipment: Engineer, drilling (specify type) ADL Screening (condition at time of admission) Patient's cognitive ability adequate to safely complete daily activities?: Yes Is the patient deaf or have difficulty hearing?: No Does the patient have difficulty seeing, even when wearing glasses/contacts?: No Does the patient have difficulty concentrating, remembering, or making decisions?: No Patient able to express need for assistance with ADLs?: Yes Does the patient have difficulty dressing or bathing?: Yes Independently performs ADLs?: No Dressing (OT): Needs assistance Bathing: Needs assistance Does the patient have difficulty walking or climbing stairs?: Yes Weakness of Legs: Both Weakness of Arms/Hands:  Both  Permission Sought/Granted                  Emotional Assessment Appearance:: Appears stated age Attitude/Demeanor/Rapport: Guarded Affect (typically observed): Overwhelmed Orientation: : Oriented to Self,Oriented to Place,Oriented to  Time,Oriented to Situation Alcohol / Substance Use: Not Applicable Psych Involvement: No (comment)  Admission diagnosis:  Dehydration [E86.0] Hypokalemia [E87.6] Lactic acidosis [E87.2] Failure to thrive in adult [R62.7] Intractable nausea and vomiting [R11.2] Intractable vomiting with nausea [R11.2] Patient Active Problem List   Diagnosis Date Noted  . Intractable vomiting with nausea 05/03/2020  . Acute lower UTI 04/21/2020  . Dehydration 04/21/2020  . Depression with anxiety 04/21/2020  . Transaminitis 04/21/2020  . Metastatic malignant neoplasm ([place])   . Nausea vomiting and diarrhea   . Intractable nausea and vomiting 04/20/2020  . PICC (peripherally inserted central catheter) in place 04/17/2020  . Bone metastases ([place]) 03/11/2020  . Liver metastases ([place]) 03/05/2020  . Lung metastases ([place]) 03/05/2020  . Postoperative state 04/23/2019  . Superficial basal cell carcinoma (BCC) 02/10/2019  . Goals of care, counseling/discussion 12/14/2017  . Family history of uterine cancer   . Family history of prostate cancer   . Melanoma in situ of right upper extremity ([place]) 07/18/2017  . Port-A-Cath in place 04/18/2017  . Drug-induced neutropenia ([place]) 03/09/2017  . Family history of breast cancer 02/02/2017  . Family history of colon cancer 02/02/2017  . Family history of prostate cancer in father 02/02/2017  . Malignant neoplasm of overlapping sites of left breast in female, estrogen receptor positive ([place]) 01/31/2017  . Hx of hot flashes, menopausal, HRT in the past 12/19/2011  . Osteopenia, stable, DEXA  2013, recs to repeat in 2018 12/19/2011  . Hx of Hyperlipidemia, LDL 144 in 07/2011 12/19/2011  . Vitamin D insufficiency  12/19/2011  . Fibroid    PCP:  [name], MD Pharmacy:   [place], [place] [address] Phone: [phone] Fax: [phone]     Social Determinants of Health (SDOH) Interventions    Readmission Risk Interventions Readmission Risk Prevention Plan 05/04/2020  Transportation Screening Complete  PCP or Specialist Appt within 3-5 Days Complete  HRI or [place] Complete  Social Work Consult for [place] Planning/Counseling Complete  Palliative Care Screening Complete  Medication Review Press photographer) Complete  Some recent data might be hidden

## 2020-05-04 NOTE — Progress Notes (Signed)
Chaplain engaged in a follow-up visit with [facility] regarding [employer].  Brooke [name] shared that she wants to appoint her oldest sister as her healthcare agent.  Brooke [name] discussed that she believes out of all her siblings her oldest sister is the best choice as her healthcare agent but that she still needs to have a conversation with her regarding her needs and desires if she is unable to speak for herself.  Brooke [name] also shared what brought her back to the hospital.  She stated that they are still trying to figure out what her body his reacting to and the source of her swollen feet.  Brooke [name] has been doing little exercises with her feet for circulation.    Chaplain offered support and ministries of presence and listening.    05/04/20 1200  Clinical Encounter Type  Visited With Patient  Visit Type Initial

## 2020-05-04 NOTE — Evaluation (Addendum)
Physical Therapy Evaluation Patient Details Name: Brooke [name] MRN: [id number] DOB: 06-Jul-1950 Today's Date: 05/04/2020   History of Present Illness  70 year old female with history of metastatic breast cancer, hypertension, anxiety presented with intractable nausea vomiting and generalized weakness. Dx of UTI.  Clinical Impression  Pt admitted with above diagnosis. Pt lives alone, and does not have 24* assistance available. At present she requires mod assist for bed mobility. She felt, "woozy" in sitting, so did not attempt to stand. BP 153/79 sitting. Pt will likely need ST-SNF upon acute DC due to significant weakness and deconditioning. Instructed pt in bed level BUE/LE strengthening exercises to be done independently to minimize further deconditioning during hospitalization.  Pt currently with functional limitations due to the deficits listed below (see PT Problem List). Pt will benefit from skilled PT to increase their independence and safety with mobility to allow discharge to the venue listed below.          Follow Up Recommendations SNF;Home health PT (depending on progress, pt hopeful for home but agreeable to ST-SNF); assistance for mobility    Equipment Recommendations  Wheelchair cushion (measurements PT);Wheelchair (measurements PT)    Recommendations for Other Services   OT evaluation    Precautions / Restrictions Precautions Precautions: Fall Precaution Comments: pt denies falls in the past 1 year Restrictions Weight Bearing Restrictions: No      Mobility  Bed Mobility Overal bed mobility: Needs Assistance Bed Mobility: Supine to Sit     Supine to sit: Mod assist     General bed mobility comments: assist to advance RLE to edge of bed, assist to raise trunk and pivot hips to edge of bed, pt reported feeling "woozy" at the edge of the bed, woozy feeling did not improve after sitting for a few minutes, BP sitting 153/79 R forearm, SaO2 89% on room air, up to 94%  with pursed lip breathing    Transfers                 General transfer comment: deferred 2* "woozy" feeling in sitting, pt stated she didn't feel safe attempting to stand  Ambulation/Gait                Stairs            Wheelchair Mobility    Modified Rankin (Stroke Patients Only)       Balance Overall balance assessment: Needs assistance Sitting-balance support: Feet supported;Single extremity supported Sitting balance-Leahy Scale: Fair                                       Pertinent Vitals/Pain Pain Assessment: No/denies pain    Home Living Family/patient expects to be discharged to:: Private residence Living Arrangements: Alone Available Help at Discharge: Other (Comment) (pt stated she doesn't have assistance available, some family are visiting on 05/10/20 from "other parts of the country" but can only stay a few days) Type of Home: Apartment Home Access: Stairs to enter Entrance Stairs-Rails: Left Entrance Stairs-Number of Steps: 3 Home Layout: One level Home Equipment: Walker - 2 wheels      Prior Function Level of Independence: Independent with assistive device(s)         Comments: started walking with RW just recently, denies falls in past year     Hand Dominance        Extremity/Trunk Assessment   Upper Extremity Assessment Upper Extremity  Assessment: Generalized weakness    Lower Extremity Assessment Lower Extremity Assessment: Generalized weakness;RLE deficits/detail;LLE deficits/detail RLE Deficits / Details: hip +2/5, knee ext -3/5, B feet/ankle edematous LLE Deficits / Details: knee ext +3/5, hip +3/5    Cervical / Trunk Assessment Cervical / Trunk Assessment: Normal  Communication   Communication: No difficulties  Cognition Arousal/Alertness: Awake/alert Behavior During Therapy: WFL for tasks assessed/performed Overall Cognitive Status: Within Functional Limits for tasks assessed                                         General Comments      Exercises General Exercises - Lower Extremity Ankle Circles/Pumps: AROM;Both;10 reps;Supine Quad Sets: AROM;Both;5 reps;Supine Gluteal Sets: AROM;Both;5 reps;Supine Shoulder Exercises Shoulder Flexion: AROM;Both;10 reps;Supine   Assessment/Plan    PT Assessment Patient needs continued PT services  PT Problem List Decreased strength;Decreased activity tolerance;Decreased balance;Decreased mobility       PT Treatment Interventions Gait training;Functional mobility training;Therapeutic activities;Therapeutic exercise;Patient/family education    PT Goals (Current goals can be found in the Care Plan section)  Acute Rehab PT Goals Patient Stated Goal: hopes to DC home but agreeable to SNF if needed PT Goal Formulation: With patient Time For Goal Achievement: 05/18/20 Potential to Achieve Goals: Fair    Frequency Min 3X/week   Barriers to discharge        Co-evaluation               AM-PAC PT "6 Clicks" Mobility  Outcome Measure Help needed turning from your back to your side while in a flat bed without using bedrails?: A Lot Help needed moving from lying on your back to sitting on the side of a flat bed without using bedrails?: A Lot Help needed moving to and from a bed to a chair (including a wheelchair)?: Total Help needed standing up from a chair using your arms (e.g., wheelchair or bedside chair)?: Total Help needed to walk in hospital room?: Total Help needed climbing 3-5 steps with a railing? : Total 6 Click Score: 8    End of Session   Activity Tolerance: Patient limited by fatigue Patient left: in bed;with call bell/phone within reach;with bed alarm set Nurse Communication: Mobility status PT Visit Diagnosis: Difficulty in walking, not elsewhere classified (R26.2);Muscle weakness (generalized) (M62.81)    Time: 3354-5625 PT Time Calculation (min) (ACUTE ONLY): 44 min   Charges:   PT  Evaluation $PT Eval Moderate Complexity: 1 Mod PT Treatments $Therapeutic Exercise: 8-22 mins $Therapeutic Activity: 8-22 mins        Blondell Reveal Kistler PT 05/04/2020  Acute Rehabilitation Services Pager [phone] Office [phone]

## 2020-05-04 NOTE — Progress Notes (Addendum)
[facility]  PROGRESS NOTE  Brooke [name] ZWC:[id number] DOB: June 03, 1950 DOA: 05/02/2020 PCP: [name], MD   Brief HPI:   70 year old female with history of metastatic breast cancer, hypertension, anxiety presented with intractable nausea vomiting and generalized weakness.  She continues to have chemotherapy with Dr. [name].  Patient cancer center yesterday for care session.  When she went home she became very weak and was unable to get up from chair.  She was concerned so she came back to the ED.  She was diagnosed with UTI by oncologist few days ago.  She was prescribed antibiotics but had not been able to pick it up from pharmacy. In the ED she complained of nausea.  Start on IV fluids.   Subjective   Patient seen and examined, developed urinary retention yesterday.  Foley catheter replaced after 2 times in and out cath was done.  Denies nausea or vomiting at this time.   Assessment/Plan:     1. Generalized weakness/intractable nausea vomiting-patient started on Reglan, Ativan, IV fluids, Protonix.  Started on clear liquid diet.  KUB showed nonobstructive bowel gas pattern.  Mild increased air throughout the colon without abdominal distention which is nonspecific.  Right renal stone.  Meds adjusted per oncology. 2. UTI-urine culture from 04/30/2020 showed 20,000 colonies per mL of Enterococcus faecalis.  It is sensitive to ampicillin, continue with ampicillin 1 g IV every 6 hours.  Can switch to nitrofurantoin at discharge. 3. Hypokalemia-potassium was 3.4, will replace potassium.  Follow BMP in am. 4. Metastatic breast cancer-patient is on chemotherapy as per oncology.  Palliative care consultation has been obtained. 5. Anemia of chronic disease-likely  from underlying malignancy, today hemoglobin is 7.9.  Follow hemoglobin and transfuse for hemoglobin less than 7. 6. Urinary retention-continue Foley catheter     COVID-19 Labs  No results for input(s): DDIMER,  FERRITIN, LDH, CRP in the last 72 hours.  Lab Results  Component Value Date   SARSCOV2NAA NEGATIVE 05/02/2020   [place] NEGATIVE 04/20/2020   [place] NEGATIVE 04/19/2019     Scheduled medications:   . Chlorhexidine Gluconate Cloth  6 each Topical Daily  . losartan  12.5 mg Oral Daily  . metoCLOPramide (REGLAN) injection  10 mg Intravenous Q8H  . ondansetron (ZOFRAN) IV  4 mg Intravenous Q8H  . pantoprazole (PROTONIX) IV  40 mg Intravenous Q24H  . potassium chloride  40 mEq Oral Once         SpO2: 91 %    CBC: Recent Labs  Lab 04/30/20 0817 05/02/20 1524 05/03/20 0609 05/04/20 0527  WBC 6.0 4.6 3.8* 3.7*  NEUTROABS 4.6  --   --   --   HGB 9.6* 9.3* 7.9* 8.1*  HCT 29.5* 28.8* 24.3* 25.3*  MCV 88.1 90.0 90.3 92.0  PLT 144* 129* 91* 94*    Basic Metabolic Panel: Recent Labs  Lab 04/30/20 0817 05/02/20 1524 05/03/20 0609 05/04/20 0527  NA 138 139 140 140  K 3.3* 2.9* 3.5 3.4*  CL 103 103 106 105  CO2 19* 20* 22 20*  GLUCOSE 102* 103* 98 97  BUN 10 13 11 10   CREATININE 0.72 0.78 0.67 0.63  CALCIUM 8.1* 7.9* 7.5* 7.6*  MG  --  2.1  --   --   PHOS  --  3.0  --   --      Liver Function Tests: Recent Labs  Lab 04/30/20 0817 05/02/20 1524 05/03/20 0609 05/04/20 0527  AST 101* 129* 107* 103*  ALT  36 43 37 39  ALKPHOS 205* 194* 148* 169*  BILITOT 1.7* 2.6* 2.5*  2.4* 1.9*  PROT 5.1* 5.4* 4.6* 4.5*  ALBUMIN 2.9* 3.1* 2.6* 2.6*     Antibiotics: Anti-infectives (From admission, onward)   Start     Dose/Rate Route Frequency Ordered Stop   05/02/20 1800  ampicillin (OMNIPEN) 1 g in sodium chloride 0.9 % 100 mL IVPB        1 g 300 mL/hr over 20 Minutes Intravenous Every 6 hours 05/02/20 1722         DVT prophylaxis: SCDs  Code Status: DNR  Family Communication: Discussed with sister at bedside   Consultants:    Procedures:      Objective   Vitals:   05/03/20 0618 05/03/20 1426 05/03/20 1939 05/04/20 0557  BP: (!) 139/53  (!) 144/59 (!) 135/54 (!) 160/54  Pulse: 100 (!) 101 (!) 102 98  Resp:   14 20  Temp: 98.1 F (36.7 C) 97.9 F (36.6 C) 98.2 F (36.8 C) 98.3 F (36.8 C)  TempSrc: Oral Oral Oral Oral  SpO2: 93% 91% 92% 91%  Weight:      Height:        Intake/Output Summary (Last 24 hours) at 05/04/2020 1412 Last data filed at 05/04/2020 0630 Gross per 24 hour  Intake 1996.53 ml  Output 325 ml  Net 1671.53 ml    03/05 1901 - 03/07 0700 In: 3226.6 [I.V.:2147.9] Out: 8119 [Urine:1325]  Filed Weights   05/02/20 1410  Weight: 82.6 kg    Physical Examination:    General-appears in no acute distress  Heart-S1-S2, regular, no murmur auscultated  Lungs-clear to auscultation bilaterally, no wheezing or crackles auscultated  Abdomen-soft, nontender, no organomegaly  Extremities-no edema in the lower extremities  Neuro-alert, oriented x3, no focal deficit noted   Status is: Inpatient  Dispo: The patient is from: Home              Anticipated d/c is to:  skilled nursing facility              Anticipated d/c date is: 05/05/2020              Patient currently not medically stable for discharge  Barrier to discharge-treatment for nausea and vomiting            Data Reviewed:   Recent Results (from the past 240 hour(s))  Culture, Urine     Status: Abnormal   Collection Time: 04/30/20  1:00 PM   Specimen: Urine, Clean Catch  Result Value Ref Range Status   Specimen Description   Final    URINE, CLEAN CATCH Performed at [facility], [address], [place], [place] [address]    Special Requests   Final    NONE Performed at [facility], [place] [address] [place], [place] [address]    Culture 20,000 COLONIES/mL ENTEROCOCCUS FAECALIS (A)  Final   Report Status 05/02/2020 FINAL  Final   Organism ID, Bacteria ENTEROCOCCUS FAECALIS (A)  Final      Susceptibility   Enterococcus faecalis - MIC*    AMPICILLIN <=2 SENSITIVE  Sensitive     NITROFURANTOIN <=16 SENSITIVE Sensitive     VANCOMYCIN 2 SENSITIVE Sensitive     * 20,000 COLONIES/mL ENTEROCOCCUS FAECALIS  SARS CORONAVIRUS 2 (TAT 6-24 HRS) Nasopharyngeal Nasopharyngeal Swab     Status: None   Collection Time: 05/02/20  7:15 PM   Specimen: Nasopharyngeal Swab  Result Value  Ref Range Status   SARS Coronavirus 2 NEGATIVE NEGATIVE Final    Comment: (NOTE) SARS-CoV-2 target nucleic acids are NOT DETECTED.  The SARS-CoV-2 RNA is generally detectable in upper and lower respiratory specimens during the acute phase of infection. Negative results do not preclude SARS-CoV-2 infection, do not rule out co-infections with other pathogens, and should not be used as the sole basis for treatment or other patient management decisions. Negative results must be combined with clinical observations, patient history, and epidemiological information. The expected result is Negative.  Fact Sheet for Patients: SugarRoll.be  Fact Sheet for Healthcare Providers: https://www.woods-mathews.com/  This test is not yet approved or cleared by the [place] FDA and  has been authorized for detection and/or diagnosis of SARS-CoV-2 by FDA under an Emergency Use Authorization (EUA). This EUA will remain  in effect (meaning this test can be used) for the duration of the COVID-19 declaration under Se ction 564(b)(1) of the Act, 21 U.S.C. section 360bbb-3(b)(1), unless the authorization is terminated or revoked sooner.  Performed at [facility], [place] [address], [place], [place] [address]     Recent Labs  Lab 05/02/20 1524  LIPASE 69*   No results for input(s): AMMONIA in the last 168 hours.  Cardiac Enzymes: No results for input(s): CKTOTAL, CKMB, CKMBINDEX, TROPONINI in the last 168 hours. BNP (last 3 results) No results for input(s): BNP in the last 8760 hours.  ProBNP (last 3 results) No results for input(s): PROBNP  in the last 8760 hours.  Studies:  DG Abd 1 View  Result Date: 05/02/2020 CLINICAL DATA:  The joint with nausea and vomiting. EXAM: ABDOMEN - 1 VIEW COMPARISON:  Radiograph 04/20/2020, but CT 03/05/2020 FINDINGS: No evidence of free air on this supine view. Slight increased air throughout not abnormally distended colon. Probable barium in the appendix. There is a 2.2 cm stone in the region of the right renal pelvis. Granulomatous calcifications of the liver and spleen. Cholecystectomy clips in the right upper quadrant. Known osseous metastatic disease on prior PET is not well visualized by radiograph. IMPRESSION: 1. Nonobstructive bowel gas pattern. Mild increased air throughout the colon without abnormal distension is nonspecific in the setting. 2. Right renal stone. Electronically Signed   By: Keith Rake M.D.   On: 05/02/2020 20:13   CT Head Wo Contrast  Result Date: 05/02/2020 CLINICAL DATA:  70 year old female with metastatic breast cancer. Evaluate for brain metastases. EXAM: CT HEAD WITHOUT CONTRAST TECHNIQUE: Contiguous axial images were obtained from the base of the skull through the vertex without intravenous contrast. COMPARISON:  03/26/2020 brain MR FINDINGS: Brain: Parasagittal mass along the mid falx (series 2: Image 22-23) does not appear significantly changed from 03/26/2020 MR. The LEFT cerebellar lesion identified on recent MR is difficult to visualize on this study. No new masses/metastatic disease is identified on this noncontrast study. There is no evidence of acute infarct, midline shift, hydrocephalus or hemorrhage. Vascular: No hyperdense vessel or unexpected calcification. Skull: The known skull lesions identified on recent MR are difficult to visualize on this study. Sinuses/Orbits: No acute abnormality Other: None IMPRESSION: 1. No acute abnormality. No new masses/metastatic disease on this noncontrast study. 2. No significant change in parafalcine mass/metastasis. 3. The LEFT  cerebellar lesion and skull lesions identified on recent MR are difficult to visualize on this study. Electronically Signed   By: Margarette Canada M.D.   On: 05/02/2020 16:01   DG Chest Port 1 View  Result Date: 05/02/2020 CLINICAL DATA:  Weakness.  Nausea. EXAM: PORTABLE CHEST 1 VIEW COMPARISON:  Chest CT 02/27/2020 FINDINGS: Tip of the right upper extremity PICC in the SVC. Heart is borderline enlarged. Mild hilar prominence which may be due to adenopathy. There also calcified mediastinal and left hilar nodes. Many of the pulmonary nodules on prior CT are not seen. There is a calcified granuloma at the left lung base. No confluent consolidation. No pleural fluid. No pneumothorax. Postsurgical change of the left axilla and chest wall. Known osseous metastatic disease is not well detected by radiograph. IMPRESSION: 1. No acute findings. 2. Bilateral hilar prominence may be due to adenopathy in this patient with known metastatic breast cancer. 3. Right upper extremity PICC tip in the SVC. Electronically Signed   By: Keith Rake M.D.   On: 05/02/2020 15:08       [place]   [facility] If 7PM-7AM, please contact night-coverage at [email or web address], Office  [phone]   05/04/2020, 2:12 PM  LOS: 1 day

## 2020-05-04 NOTE — NC FL2 (Signed)
[place] MEDICAID FL2 LEVEL OF CARE SCREENING TOOL     IDENTIFICATION  Patient Name: Brooke [name] Birthdate: 03-31-1950 Sex: female Admission Date (Current Location): 05/02/2020  [facility] and [place] Number:  Herbalist and Address:  [facility],  [place] [address], [place]      Provider Number: [id number]  Attending Physician Name and Address:  Oswald Hillock, MD  Relative Name and Phone Number:       Current Level of Care: Hospital Recommended Level of Care: [place] Prior Approval Number:    Date Approved/Denied:   PASRR Number:    Discharge Plan: SNF    Current Diagnoses: Patient Active Problem List   Diagnosis Date Noted  . Intractable vomiting with nausea 05/03/2020  . Acute lower UTI 04/21/2020  . Dehydration 04/21/2020  . Depression with anxiety 04/21/2020  . Transaminitis 04/21/2020  . Metastatic malignant neoplasm ([place])   . Nausea vomiting and diarrhea   . Intractable nausea and vomiting 04/20/2020  . PICC (peripherally inserted central catheter) in place 04/17/2020  . Bone metastases ([place]) 03/11/2020  . Liver metastases ([place]) 03/05/2020  . Lung metastases ([place]) 03/05/2020  . Postoperative state 04/23/2019  . Superficial basal cell carcinoma (BCC) 02/10/2019  . Goals of care, counseling/discussion 12/14/2017  . Family history of uterine cancer   . Family history of prostate cancer   . Melanoma in situ of right upper extremity ([place]) 07/18/2017  . Port-A-Cath in place 04/18/2017  . Drug-induced neutropenia ([place]) 03/09/2017  . Family history of breast cancer 02/02/2017  . Family history of colon cancer 02/02/2017  . Family history of prostate cancer in father 02/02/2017  . Malignant neoplasm of overlapping sites of left breast in female, estrogen receptor positive ([place]) 01/31/2017  . Hx of hot flashes, menopausal, HRT in the past 12/19/2011  . Osteopenia, stable, DEXA 2013, recs to repeat in 2018  12/19/2011  . Hx of Hyperlipidemia, LDL 144 in 07/2011 12/19/2011  . Vitamin D insufficiency 12/19/2011  . Fibroid     Orientation RESPIRATION BLADDER Height & Weight     Self,Time,Situation,Place  Normal Continent Weight: 82.6 kg Height:  5\' 9"  (175.3 cm)  BEHAVIORAL SYMPTOMS/MOOD NEUROLOGICAL BOWEL NUTRITION STATUS      Continent Diet (regular)  AMBULATORY STATUS COMMUNICATION OF NEEDS Skin   Limited Assist Verbally Other (Comment) (redness to gluteal folds)                       Personal Care Assistance Level of Assistance  Bathing,Dressing Bathing Assistance: Limited assistance   Dressing Assistance: Limited assistance     Functional Limitations Info  Sight Sight Info: Impaired (Wears glasses)        SPECIAL CARE FACTORS FREQUENCY  PT (By licensed PT),OT (By licensed OT)     PT Frequency: 5 x weekly OT Frequency: 5 x weekly            Contractures Contractures Info: Not present    Additional Factors Info  Code Status,Allergies Code Status Info: DNR Allergies Info: Neosporin (Neomycin-bacitracin Zn-polymyx), Bactrim (Sulfamethoxazole-trimethoprim), Benzalkonium Chloride, Latex           Current Medications (05/04/2020):  This is the current hospital active medication list Current Facility-Administered Medications  Medication Dose Route Frequency Provider Last Rate Last Admin  . 0.9 %  sodium chloride infusion   Intravenous Continuous Cherylann Ratel A, DO 75 mL/hr at 05/04/20 0315 Infusion Verify at 05/04/20 0315  . acetaminophen (TYLENOL)  tablet 650 mg  650 mg Oral Q6H PRN Marylyn Ishihara, Tyrone A, DO       Or  . acetaminophen (TYLENOL) suppository 650 mg  650 mg Rectal Q6H PRN Marylyn Ishihara, Tyrone A, DO      . ampicillin (OMNIPEN) 1 g in sodium chloride 0.9 % 100 mL IVPB  1 g Intravenous Q6H Kyle, Tyrone A, DO 300 mL/hr at 05/04/20 1239 1 g at 05/04/20 1239  . Chlorhexidine Gluconate Cloth 2 % PADS 6 each  6 each Topical Daily Kyle, Tyrone A, DO   6 each at 05/04/20 1014   . hydrALAZINE (APRESOLINE) injection 10 mg  10 mg Intravenous Q8H PRN Kyle, Tyrone A, DO      . LORazepam (ATIVAN) tablet 0.5 mg  0.5 mg Sublingual Q8H PRN Marylyn Ishihara, Tyrone A, DO      . losartan (COZAAR) tablet 12.5 mg  12.5 mg Oral Daily Kyle, Tyrone A, DO   12.5 mg at 05/04/20 1013  . metoCLOPramide (REGLAN) injection 10 mg  10 mg Intravenous Q8H Lama, Marge Duncans, MD      . ondansetron [facility]) injection 4 mg  4 mg Intravenous Q8H Curcio, Kristin R, NP   4 mg at 05/04/20 1339  . pantoprazole (PROTONIX) injection 40 mg  40 mg Intravenous Q24H Kyle, Tyrone A, DO   40 mg at 05/03/20 2121  . potassium chloride SA (KLOR-CON) CR tablet 40 mEq  40 mEq Oral Once [place], Marge Duncans, MD      . prochlorperazine (COMPAZINE) injection 10 mg  10 mg Intravenous Q6H PRN Maryanna Shape, NP         Discharge Medications: Please see discharge summary for a list of discharge medications.  Relevant Imaging Results:  Relevant Lab Results:   Additional Information covid vaccinated SS# [id number]  [place]uita Mcgrory, Marjie Skiff, RN

## 2020-05-04 NOTE — Evaluation (Signed)
Clinical/Bedside Swallow Evaluation Patient Details  Name: Brooke [name] MRN: [id number] Date of Birth: [birth year]  Today's Date: 05/04/2020 Time: SLP Start Time (ACUTE ONLY): 1440 SLP Stop Time (ACUTE ONLY): 1455 SLP Time Calculation (min) (ACUTE ONLY): 15 min  Past Medical History:  Past Medical History:  Diagnosis Date  . Anemia   . Anxiety   . Arthritis   . ASCUS of cervix with negative high risk HPV 12/2018  . Bilateral dry eyes   . Breast cancer, left breast ([place])   . Family history of breast cancer 02/02/2017  . Family history of breast cancer   . Family history of colon cancer 02/02/2017  . Family history of colon cancer   . Family history of prostate cancer   . Family history of prostate cancer in father 02/02/2017  . Family history of uterine cancer   . Fibroid   . History of kidney stones   . Hyperlipidemia   . Hypertension   . Melanoma of upper arm ([place]) 2009   RIGHT ARM   . Obesity (BMI 30.0-34.9)   . Osteopenia 09/2017   T score -1.9 max 8% / 0.8% statistically significant decline at right and left hip stable at the spine  . PONV (postoperative nausea and vomiting)   . Squamous carcinoma     of the skin  . UTI (urinary tract infection)   . Varicose veins of both lower extremities   . Vitamin D deficiency 07/2009   LOW VITAMIN D 29  . Vitamin deficiency 07/2008   VITAMIN D LOW 23   Past Surgical History:  Past Surgical History:  Procedure Laterality Date  . ABDOMINAL SURGERY  1975   ovairan cyst   . APPENDECTOMY    . BREAST LUMPECTOMY WITH RADIOACTIVE SEED LOCALIZATION Right 09/25/2017   Procedure: RIGHT BREAST LUMPECTOMY WITH RADIOACTIVE SEED LOCALIZATION;  Surgeon: Jovita Kussmaul, MD;  Location: [place];  Service: General;  Laterality: Right;  . BREAST SURGERY     bio  . CHOLECYSTECTOMY    . DERMOID CYST  EXCISION    . DILATATION & CURETTAGE/HYSTEROSCOPY WITH MYOSURE N/A 04/23/2019   Procedure: DILATATION & CURETTAGE/HYSTEROSCOPY WITH MYOSURE;  Surgeon:  [name], MD;  Location: [place];  Service: Gynecology;  Laterality: N/A;  request to follow at 9:30am in [facility] block requests 45 minutes  . DILATION AND CURETTAGE OF UTERUS  1998  . GALLBLADDER SURGERY    . HYSTEROSCOPY    . insertion  port a cath  03/02/2016  . MASTECTOMY MODIFIED RADICAL Left 09/25/2017   Procedure: LEFT MODIFIED RADICAL MASTECTOMY;  Surgeon: Jovita Kussmaul, MD;  Location: [place];  Service: General;  Laterality: Left;  . PORTACATH PLACEMENT N/A 03/02/2017   Procedure: INSERTION PORT-A-CATH;  Surgeon: Jovita Kussmaul, MD;  Location: [place];  Service: General;  Laterality: N/A;  . SKIN BIOPSY    . SKIN CANCER EXCISION  2010,2011   2010-RIGHT ARM, 2011-LEFT LOWER LEG.-DR Rolm Bookbinder DERM. DR. Sarajane Jews IS HER DERM SURGEON.   HPI:  70 year old female with history of metastatic breast cancer, hypertension, anxiety presented with intractable nausea vomiting and generalized weakness. Dx of UTI.   Assessment / Plan / Recommendation Clinical Impression  Patient presents with what appears to be a primarily esophageal based dysphagia but cannot r/o a pharyngeal component as well. Patient observed while consuming spoon bites of ice chips that were melting in water. She did not exhibit any coughing, throat clearing or changes in vocal quality  but did appear to have to put forth slightly more effort during pharyngeal phase of swallow. Patient reported that earlier in the day she had almost immediate regurgitation of fruit flavored shaved ice. She reports that since December of 2021, she has not been able to consume any solids and has been consuming only limited amount of liquids (mostly ice, water). She endorses a 50 lbs weight loss since December. Esophagram on 2/24 revealed small sliding-type hiatal hernia; No definite mass or stricture is noted in the esophagus, however also reported that study was limited due to patient only consuming a small amount of  barium and unable to take PO of barium tablet. SLP will follow up with patient regarding ability to participate in objective swallow assessment (MBS), however at this time, she would be unable to consume enough of the barium to get an accurate result. SLP Visit Diagnosis: Dysphagia, unspecified (R13.10)    Aspiration Risk  Risk for inadequate nutrition/hydration;Mild aspiration risk;Moderate aspiration risk    Diet Recommendation Thin liquid   Liquid Administration via: Cup;Straw Medication Administration: Other (Comment) (liquid suspension if possible, patient reported she prefers IV meds) Supervision: Patient able to self feed Postural Changes: Seated upright at 90 degrees    Other  Recommendations Oral Care Recommendations: Oral care BID   Follow up Recommendations None      Frequency and Duration min 2x/week  1 week       Prognosis Prognosis for Safe Diet Advancement: Good      Swallow Study   General Date of Onset: 05/02/20 HPI: 70 year old female with history of metastatic breast cancer, hypertension, anxiety presented with intractable nausea vomiting and generalized weakness. Dx of UTI. Type of Study: Bedside Swallow Evaluation Previous Swallow Assessment: No BSE or MBS; had EGD 2/24 Diet Prior to this Study: Thin liquids Temperature Spikes Noted: No Respiratory Status: Room air History of Recent Intubation: No Behavior/Cognition: Alert;Cooperative;Pleasant mood Oral Care Completed by SLP: No Oral Cavity - Dentition: Adequate natural dentition Vision: Functional for self-feeding Self-Feeding Abilities: Able to feed self Patient Positioning: Upright in bed Baseline Vocal Quality: Normal Volitional Cough: Strong Volitional Swallow: Able to elicit    Oral/Motor/Sensory Function Overall Oral Motor/Sensory Function: Within functional limits   Ice Chips Ice chips: Impaired Presentation: Self Fed;Spoon Other Comments: Patient appeared to have to put forth more effort  for swallow but did not exhibit any other overt s/s of dysphagia.   Thin Liquid Thin Liquid: Impaired Pharyngeal  Phase Impairments: Other (comments) Other Comments: Patient appeared to have to put forth more effort for swallow but did not exhibit any other overt s/s of dysphagia.    Nectar Thick Nectar Thick Liquid: Not tested   Honey Thick Honey Thick Liquid: Not tested   Puree Puree: Not tested   Solid     Solid: Not tested      Sonia Baller, MA, CCC-SLP Speech Therapy

## 2020-05-04 NOTE — Progress Notes (Signed)
Appreiate learning of Ms San's admission. I will see herlate today.  Palliative Care was very helpful last admission and developed a relationship with [facility]. I agree with reconsulting them as you have done.  I don't know how Lashea can continue to stay by herself. She has finally agreed to contact her sisters but it is not lear how much help they can be. Ideally [facility]ie would be somewhere where she could have 24/7 care while she either responds to chemo or deteriorates further. Currently she has been referred to [facility] service, but not yet to full Hospice

## 2020-05-04 NOTE — Consult Note (Signed)
Consultation Note Date: 05/04/2020   Patient Name: Brooke [name]  DOB: 07-Jul-1950  MRN: [id number]  Age / Sex: 70 y.o., female   PCP: [name], MD Referring Physician: Oswald Hillock, MD  Reason for Consultation: Establishing goals of care and Non pain symptom management  Palliative Care Assessment and Plan Summary of Established Goals of Care and Medical Treatment Preferences   Clinical Assessment/Narrative: Ms. Mallicoat is a 70 y/o female with metastatic breast cancer followed by Dr. [name] who presented with n/v and generalized weakness. She was admitted a couple of weeks ago with similar presentation. Work-up consistent with UTI for which she is on appropriate antibiotics. Palliative care followed her during previous hospitalization and was re-consulted this admission for ongoing goal of care discussion and symptom management.   Contacts/Participants in Discussion: Primary Decision Maker: patient    HCPOA: yes. In the process of making her older sister her 47  Code Status/Advance Care Planning:  DNR  Symptom Management:   Nausea: agree with current regimen of scheduled zofran, phenergan with prn compazine and ativan. Could consider adding steroids if her symptoms are refractory to current treatments. Will order SLP eval, as her inability to tolerate solid foods sounds more structural than strictly nausea   Additional Recommendations (Limitations, Scope, Preferences):  Full scope  Psycho-social/Spiritual:   Support System: Ms. Eiza lives alone, but does have two sisters that she keeps in touch with   Desire for further Chaplaincy support: already stopped by for [facility] paperwork   Prognosis: Unable to determine  Discharge Planning: To be determined; she is open to SNF for rehab and with palliative care follow-up         Chief Complaint/History of Present Illness:  Ms. Blitzer is a 70 y/o female with metastatic breast cancer followed by Dr. [name] who  presented with n/v and generalized weakness. She was admitted a couple of weeks ago with similar presentation. Work-up consistent with UTI for which she is on appropriate antibiotics. Palliative care followed her during previous hospitalization and was re-consulted this admission for ongoing goal of care discussion and symptom management.   Went to visit Ms. Macpherson this afternoon. We re-introduced palliative medicine as specialized medical care for people living with serious illness and how it focuses on providing relief from the symptoms and stress of a serious illness. The goal is to improve quality of life for both the patient and the family.  Ms. Iretha is honest that she quite frankly cannot really assess how she is doing. She knows she was admitted and started on antibiotics for a UTI and required foley placement because she was unable to void on her own. She recalls being at home and feeling extremely weak this past Saturday which is what prompted her going to the emergency department.  Her primary concern is her lack of nutritional intake which has been an issue since December, as well as her feet pain and swelling preventing her from getting around at home as well as she would like.   Regarding her nutrition, she states she has primarily only been able to take in liquids since December. Any time she tries to eat something solid, it immediately comes back up. She started chemo a few weeks back, and the side effect of nausea seemed to add to this ongoing issue of intake. She looks forward to discussing with Dr. [name] regarding her disease trajectory and if current treatment regimen is still something she wants to proceed with.  Regarding her feet  and overall deconditioning, she feels like physical therapy would be beneficial for her. When asked if she would be willing to go somewhere for rehab after this hospitalization, she states she would be willing to do so.   She had already begun filling  out paperwork for [facility] and plans to name her older sister.   Primary Diagnoses  Present on Admission: . Intractable nausea and vomiting . Intractable vomiting with nausea 1.   Palliative Review of Systems: Positive for nausea, difficulty swallowing, bilateral foot pain and swelling, generalized weakness.  I have reviewed the medical record, interviewed the patient and family, and examined the patient. The following aspects are pertinent.  Past Medical History:  Diagnosis Date  . Anemia   . Anxiety   . Arthritis   . ASCUS of cervix with negative high risk HPV 12/2018  . Bilateral dry eyes   . Breast cancer, left breast ([place])   . Family history of breast cancer 02/02/2017  . Family history of breast cancer   . Family history of colon cancer 02/02/2017  . Family history of colon cancer   . Family history of prostate cancer   . Family history of prostate cancer in father 02/02/2017  . Family history of uterine cancer   . Fibroid   . History of kidney stones   . Hyperlipidemia   . Hypertension   . Melanoma of upper arm ([place]) 2009   RIGHT ARM   . Obesity (BMI 30.0-34.9)   . Osteopenia 09/2017   T score -1.9 max 8% / 0.8% statistically significant decline at right and left hip stable at the spine  . PONV (postoperative nausea and vomiting)   . Squamous carcinoma     of the skin  . UTI (urinary tract infection)   . Varicose veins of both lower extremities   . Vitamin D deficiency 07/2009   LOW VITAMIN D 29  . Vitamin deficiency 07/2008   VITAMIN D LOW 23   Social History   Socioeconomic History  . Marital status: Single    Spouse name: Not on file  . Number of children: Not on file  . Years of education: Not on file  . Highest education level: Not on file  Occupational History  . Not on file  Tobacco Use  . Smoking status: Never Smoker  . Smokeless tobacco: Never Used  Vaping Use  . Vaping Use: Never used  Substance and Sexual Activity  . Alcohol use: No  . Drug  use: No  . Sexual activity: Not Currently    Birth control/protection: Post-menopausal    Comment: 1st intercourse 26yo-1 partner  Other Topics Concern  . Not on file  Social History Narrative  . Not on file   Social Determinants of Health   Financial Resource Strain: Not on file  Food Insecurity: Not on file  Transportation Needs: Not on file  Physical Activity: Not on file  Stress: Not on file  Social Connections: Not on file   Family History  Problem Relation Age of Onset  . Diabetes Father   . Hypertension Father   . Heart disease Father   . Prostate cancer Father 35  . Arthritis Father   . Breast cancer Sister 4       again at age 55, GT results unk  . Skin cancer Sister   . Colon cancer Paternal Grandfather 70  . Hypertension Mother   . Heart failure Mother   . Uterine cancer Sister 9  stage IV  . Breast cancer Cousin        mat first cousin dx under 47   Scheduled Meds: . Chlorhexidine Gluconate Cloth  6 each Topical Daily  . losartan  12.5 mg Oral Daily  . metoCLOPramide (REGLAN) injection  10 mg Intravenous Q8H  . ondansetron (ZOFRAN) IV  4 mg Intravenous Q8H  . pantoprazole (PROTONIX) IV  40 mg Intravenous Q24H   Continuous Infusions: . sodium chloride 75 mL/hr at 05/04/20 0315  . ampicillin (OMNIPEN) IV 1 g (05/04/20 1239)   PRN Meds:.acetaminophen **OR** acetaminophen, hydrALAZINE, LORazepam, prochlorperazine Medications Prior to Admission:  Prior to Admission medications   Medication Sig Start Date End Date Taking? Authorizing Provider  losartan (COZAAR) 25 MG tablet Take 0.5 tablets (12.5 mg total) by mouth daily. 04/24/20  Yes Nita Sells, MD  metoCLOPramide (REGLAN) 10 MG tablet Take 1 tablet (10 mg total) by mouth every 8 (eight) hours as needed for nausea. 04/24/20  Yes Nita Sells, MD  pantoprazole (PROTONIX) 40 MG tablet Take 1 tablet (40 mg total) by mouth daily. 04/25/20  Yes Nita Sells, MD  potassium chloride  (KLOR-CON) 10 MEQ tablet Take 40 mEq by mouth daily. 04/24/20  Yes [provider]  ciprofloxacin (CIPRO) 500 MG tablet Take 1 tablet (500 mg total) by mouth 2 (two) times daily. 04/30/20   Magrinat, Virgie Dad, MD  LORazepam (ATIVAN) 0.5 MG tablet Place 1 tablet (0.5 mg total) under the tongue every 8 (eight) hours as needed for anxiety. PT to use for nausea as needed 04/06/20   Magrinat, Virgie Dad, MD  ondansetron (ZOFRAN-ODT) 4 MG disintegrating tablet Take 1 tablet (4 mg total) by mouth every 8 (eight) hours as needed for nausea or vomiting. 04/06/20   Magrinat, Virgie Dad, MD  potassium chloride (KLOR-CON) 10 MEQ tablet Take 4 tablets (40 mEq total) by mouth daily for 4 days. 04/25/20 04/29/20  Nita Sells, MD  prochlorperazine (COMPAZINE) 10 MG tablet Take 1 tablet (10 mg total) by mouth every 6 (six) hours as needed (Nausea or vomiting). 03/24/20   Magrinat, Virgie Dad, MD   Allergies  Allergen Reactions  . Neosporin [Neomycin-Bacitracin Zn-Polymyx] Swelling, Rash and Other (See Comments)    Blisters  . Bactrim [Sulfamethoxazole-Trimethoprim]     Made her feel very sick  . Benzalkonium Chloride Other (See Comments)    Redness (Pt is unaware)  It works by killing microorganisms and inhibiting their future growth, and for this reason frequently appears as an ingredient in antibacterial hand wipes, antiseptic creams and anti-itch ointments.  . Latex Rash   CBC:    Component Value Date/Time   WBC 3.7 (L) 05/04/2020 0527   HGB 8.1 (L) 05/04/2020 0527   HGB 13.1 12/20/2017 1144   HGB 13.0 03/03/2017 1359   HCT 25.3 (L) 05/04/2020 0527   HCT 39.7 03/03/2017 1359   PLT 94 (L) 05/04/2020 0527   PLT 232 12/20/2017 1144   PLT 281 03/03/2017 1359   MCV 92.0 05/04/2020 0527   MCV 89.6 03/03/2017 1359   NEUTROABS 4.6 04/30/2020 0817   NEUTROABS 5.4 03/03/2017 1359   LYMPHSABS 0.7 04/30/2020 0817   LYMPHSABS 2.4 03/03/2017 1359   MONOABS 0.6 04/30/2020 0817   MONOABS 0.7 03/03/2017 1359    EOSABS 0.0 04/30/2020 0817   EOSABS 0.1 03/03/2017 1359   BASOSABS 0.0 04/30/2020 0817   BASOSABS 0.0 03/03/2017 1359   Comprehensive Metabolic Panel:    Component Value Date/Time   NA 140 05/04/2020 0527  NA 141 03/03/2017 1358   K 3.4 (L) 05/04/2020 0527   K 3.3 (L) 03/03/2017 1358   CL 105 05/04/2020 0527   CO2 20 (L) 05/04/2020 0527   CO2 28 03/03/2017 1358   BUN 10 05/04/2020 0527   BUN 12.7 03/03/2017 1358   CREATININE 0.63 05/04/2020 0527   CREATININE 0.83 02/19/2020 0936   CREATININE 0.9 03/03/2017 1358   GLUCOSE 97 05/04/2020 0527   GLUCOSE 107 03/03/2017 1358   CALCIUM 7.6 (L) 05/04/2020 0527   CALCIUM 9.4 03/03/2017 1358   AST 103 (H) 05/04/2020 0527   AST 43 (H) 09/12/2018 1354   AST 23 03/03/2017 1358   ALT 39 05/04/2020 0527   ALT 84 (H) 09/12/2018 1354   ALT 25 03/03/2017 1358   ALKPHOS 169 (H) 05/04/2020 0527   ALKPHOS 71 03/03/2017 1358   BILITOT 1.9 (H) 05/04/2020 0527   BILITOT 0.6 09/12/2018 1354   BILITOT 0.52 03/03/2017 1358   PROT 4.5 (L) 05/04/2020 0527   PROT 6.5 03/03/2017 1358   ALBUMIN 2.6 (L) 05/04/2020 0527   ALBUMIN 3.7 03/03/2017 1358    Physical Exam: Vital Signs: BP (!) 160/54 (BP Location: Left Arm)   Pulse 98   Temp 98.3 F (36.8 C) (Oral)   Resp 20   Ht 5\' 9"  (1.753 m)   Wt 82.6 kg   LMP 03/18/1997   SpO2 91%   BMI 26.88 kg/m  SpO2: SpO2: 91 % O2 Device: O2 Device: Room Air O2 Flow Rate:   Intake/output summary:   Intake/Output Summary (Last 24 hours) at 05/04/2020 1303 Last data filed at 05/04/2020 0630 Gross per 24 hour  Intake 1996.53 ml  Output 325 ml  Net 1671.53 ml   LBM: Last BM Date: 04/30/20 Baseline Weight: Weight: 82.6 kg Most recent weight: Weight: 82.6 kg  Exam Findings:  General: chronically ill appearing, sitting up in bed in NAD HEENT: dry mucous membranes  Pulm: breathing comfortably on room air Ext: bilateral pitting edema in lower extremities  Neuro: alert and oriented, non-focal           Palliative Performance Scale: 50%              Additional Data Reviewed: Recent Labs    05/03/20 0609 05/04/20 0527  WBC 3.8* 3.7*  HGB 7.9* 8.1*  PLT 91* 94*  NA 140 140  BUN 11 10  CREATININE 0.67 0.63     Time In: 12 Time Out: 1300 Time Total: 60 Greater than 50%  of this time was spent counseling and coordinating care related to the above assessment and plan.  Modena Nunnery D, DO  05/04/2020, 1:03 PM  Please contact Palliative Medicine Team phone at [phone] for questions and concerns.   PMT Attending Addendum: Patient seen and examined, rest as above. Discussed with patient and Dr Koleen Distance. Chart reviewed. Consider SLP evaluation for patient's complaint of solid food dysphagia, continue current anti emetic regimen, ongoing goals of care discussions and chaplain consult for HCPOA paperwork completion, patient open to considering SNF with palliative.  PMT to follow.  Loistine Chance MD [place] palliative.

## 2020-05-04 NOTE — Progress Notes (Signed)
[id number]  [facility] Hospital Liaison note:  This is a pending outpatient based Palliative care patient with [facility]. Will continue to follow for disposition.  Please call for any outpatient palliative care related questions or concerns.  Thank you,  Lorelee Market, [facility] Liaison [phone]

## 2020-05-05 ENCOUNTER — Other Ambulatory Visit: Payer: Medicare PPO | Admitting: Student

## 2020-05-05 DIAGNOSIS — C7951 Secondary malignant neoplasm of bone: Secondary | ICD-10-CM

## 2020-05-05 DIAGNOSIS — C7801 Secondary malignant neoplasm of right lung: Secondary | ICD-10-CM

## 2020-05-05 DIAGNOSIS — E440 Moderate protein-calorie malnutrition: Secondary | ICD-10-CM | POA: Insufficient documentation

## 2020-05-05 DIAGNOSIS — Z7189 Other specified counseling: Secondary | ICD-10-CM | POA: Diagnosis not present

## 2020-05-05 DIAGNOSIS — E876 Hypokalemia: Secondary | ICD-10-CM | POA: Diagnosis not present

## 2020-05-05 DIAGNOSIS — R531 Weakness: Secondary | ICD-10-CM | POA: Diagnosis not present

## 2020-05-05 DIAGNOSIS — R627 Adult failure to thrive: Secondary | ICD-10-CM | POA: Diagnosis not present

## 2020-05-05 DIAGNOSIS — R112 Nausea with vomiting, unspecified: Secondary | ICD-10-CM | POA: Diagnosis not present

## 2020-05-05 DIAGNOSIS — C7802 Secondary malignant neoplasm of left lung: Secondary | ICD-10-CM

## 2020-05-05 DIAGNOSIS — R1310 Dysphagia, unspecified: Secondary | ICD-10-CM | POA: Diagnosis not present

## 2020-05-05 DIAGNOSIS — E860 Dehydration: Secondary | ICD-10-CM | POA: Diagnosis not present

## 2020-05-05 DIAGNOSIS — R39198 Other difficulties with micturition: Secondary | ICD-10-CM | POA: Diagnosis not present

## 2020-05-05 LAB — BASIC METABOLIC PANEL
Anion gap: 13 (ref 5–15)
BUN: 10 mg/dL (ref 8–23)
CO2: 20 mmol/L — ABNORMAL LOW (ref 22–32)
Calcium: 7.9 mg/dL — ABNORMAL LOW (ref 8.9–10.3)
Chloride: 105 mmol/L (ref 98–111)
Creatinine, Ser: 0.54 mg/dL (ref 0.44–1.00)
GFR, Estimated: >60 mL/min (ref >60)
Glucose, Bld: 98 mg/dL (ref 70–99)
Potassium: 3.4 mmol/L — ABNORMAL LOW (ref 3.5–5.1)
Sodium: 138 mmol/L (ref 135–145)

## 2020-05-05 MED ORDER — BOOST / RESOURCE BREEZE PO LIQD CUSTOM
1.0000 | Freq: Three times a day (TID) | ORAL | Status: DC
  Administered 2020-05-05 – 2020-05-11 (×11): 1 via ORAL

## 2020-05-05 MED ORDER — METOCLOPRAMIDE HCL 5 MG/ML IJ SOLN
10.0000 mg | Freq: Three times a day (TID) | INTRAMUSCULAR | Status: DC | PRN
  Administered 2020-05-07: 10 mg via INTRAVENOUS
  Filled 2020-05-05: qty 2

## 2020-05-05 MED ORDER — POLYETHYLENE GLYCOL 3350 17 G PO PACK
17.0000 g | PACK | Freq: Every day | ORAL | Status: DC
  Administered 2020-05-05 – 2020-05-10 (×6): 17 g via ORAL
  Filled 2020-05-05 (×7): qty 1

## 2020-05-05 MED ORDER — POTASSIUM CHLORIDE 10 MEQ/100ML IV SOLN
10.0000 meq | INTRAVENOUS | Status: AC
  Administered 2020-05-05 (×2): 10 meq via INTRAVENOUS
  Filled 2020-05-05 (×2): qty 100

## 2020-05-05 NOTE — Progress Notes (Signed)
Daily Progress Note   Patient Name: Brooke [name]       Date: 05/05/2020 DOB: 1950/10/02  Age: 70 y.o. MRN#: [id number] Attending Physician: Oswald Hillock, MD Primary Care Physician: [name], MD Admit Date: 05/02/2020  Reason for Consultation/Follow-up: Establishing goals of care  Subjective: I saw and examined Ms. Broccoli today.  She was lying in bed sleeping on my arrival.  She did wake up and was able to speak with me, however she reported being very tired.  She tells me that she is just coming back from MRI that was ordered (although I am not sure this is actually the case).  Ms. Harbach denied current pain or nausea.  Reports she continues to have difficulty eating anything and continues to lose weight.  We reviewed conversation she had with oncology earlier today and she is agreeable to our team following up tomorrow after results of MRI are available.  Her friend/landlord, Lajoyce Lauber, called palliative care number and asked me to return her call.  It is documented in note on 3/3 by Dr. [name] that Ms. Raval wanted to add Ms. Lilia Pro to her contact list to be able to receive information.  However, I did not see Ms. Morgan's name on her contact list at this time.  Therefore, I listened to Ms. Morgan's concerns but did not share any clinical information about Ms. Selk until I have the opportunity to discuss what Ms. Dubs would like to be shared with her.  Ms. Lilia Pro expressed concern similar to when she spoke with social worker on 3/1 that Ms. Stjames is not safe to be home alone.  She tells me that Qamar does not have any family locally and she has been trying to help her as much as possible, however, she is not going to be able to support Farheen in her current home  environment moving forward.  She reports that various family, including her sisters and a nephew, are planning to come into town this weekend to try and determine next steps moving forward for Ms. Kutsch's care.  She tells me she has continued to see decline in Ms. Palin's nutrition and functional status and that Ms. Gipe was unable to get up from her couch for over 30 hours prior to her coming to the emergency room this admission.  Length of Stay: 2  Current Medications: Scheduled Meds:  . Chlorhexidine Gluconate Cloth  6 each Topical Daily  . dexamethasone  4 mg Intravenous Q12H  . losartan  12.5 mg Oral Daily  . ondansetron (ZOFRAN) IV  4 mg Intravenous Q8H  . pantoprazole (PROTONIX) IV  40 mg Intravenous Q24H  . polyethylene glycol  17 g Oral Daily    Continuous Infusions: . sodium chloride 75 mL/hr at 05/05/20 0951  . ampicillin (OMNIPEN) IV 1 g (05/05/20 1310)  . potassium chloride 10 mEq (05/05/20 1403)    PRN Meds: acetaminophen **OR** acetaminophen, hydrALAZINE, LORazepam, metoCLOPramide (REGLAN) injection, prochlorperazine  Physical Exam         General: Sleepy, chronically ill-appearing HEENT: No bruits, no goiter, no JVD Lungs: Good air movement, clear Abdomen: Soft, nondistended, positive bowel sounds.  Ext: Significant edema Skin: Warm and dry  Vital Signs: BP (!) 124/56 (BP Location: Right Wrist)   Pulse 95   Temp 99 F (37.2 C) (Oral)   Resp 17   Ht 5\' 9"  (1.753 m)   Wt 82.6 kg   LMP 03/18/1997   SpO2 (!) 88%   BMI 26.88 kg/m  SpO2: SpO2: (!) 88 % O2 Device: O2 Device: Room Air O2 Flow Rate:    Intake/output summary:   Intake/Output Summary (Last 24 hours) at 05/05/2020 1415 Last data filed at 05/04/2020 1724 Gross per 24 hour  Intake --  Output 300 ml  Net -300 ml   LBM: Last BM Date:  (PTA per pt) Baseline Weight: Weight: 82.6 kg Most recent weight: Weight: 82.6 kg       Palliative Assessment/Data:      Patient Active Problem List    Diagnosis Date Noted  . Intractable vomiting with nausea 05/03/2020  . Acute lower UTI 04/21/2020  . Dehydration 04/21/2020  . Depression with anxiety 04/21/2020  . Transaminitis 04/21/2020  . Metastatic malignant neoplasm ([place])   . Nausea vomiting and diarrhea   . Intractable nausea and vomiting 04/20/2020  . PICC (peripherally inserted central catheter) in place 04/17/2020  . Bone metastases ([place]) 03/11/2020  . Liver metastases ([place]) 03/05/2020  . Lung metastases ([place]) 03/05/2020  . Postoperative state 04/23/2019  . Superficial basal cell carcinoma (BCC) 02/10/2019  . Goals of care, counseling/discussion 12/14/2017  . Family history of uterine cancer   . Family history of prostate cancer   . Melanoma in situ of right upper extremity ([place]) 07/18/2017  . Port-A-Cath in place 04/18/2017  . Drug-induced neutropenia ([place]) 03/09/2017  . Family history of breast cancer 02/02/2017  . Family history of colon cancer 02/02/2017  . Family history of prostate cancer in father 02/02/2017  . Malignant neoplasm of overlapping sites of left breast in female, estrogen receptor positive ([place]) 01/31/2017  . Hx of hot flashes, menopausal, HRT in the past 12/19/2011  . Osteopenia, stable, DEXA 2013, recs to repeat in 2018 12/19/2011  . Hx of Hyperlipidemia, LDL 144 in 07/2011 12/19/2011  . Vitamin D insufficiency 12/19/2011  . Fibroid     Palliative Care Assessment & Plan   Patient Profile: 70 y/o female with metastatic breast cancer followed by Dr. [name] who presented with n/v and generalized weakness. She was admitted a couple of weeks ago with similar presentation. Work-up consistent with UTI for which she is on appropriate antibiotics. Palliative care followed her during previous hospitalization and was re-consulted this admission for ongoing goal of care discussion and symptom management.   Recommendations/Plan: DNR/DNI Await results of MRI.  She was seen by oncology earlier today and  Dr. [name] noted potential recommendation for hospice depending on findings of of MRI.  Goals of Care and Additional Recommendations: Limitations on Scope of Treatment: Full Scope Treatment  Code Status:    Code Status Orders  (From admission, onward)         Start     Ordered   05/02/20 1941  Do not attempt resuscitation (DNR)  Continuous       Question Answer Comment  In the event of cardiac or respiratory ARREST Do not call a "code blue"   In the event of cardiac or respiratory ARREST Do not perform Intubation, CPR, defibrillation or ACLS   In the event of cardiac or respiratory ARREST Use medication by any route, position, wound care, and other measures to relive pain and suffering. May use oxygen, suction and manual treatment of airway obstruction as needed for comfort.   Comments Confirmed with patient      05/02/20 1940        Code Status History    Date Active Date Inactive Code Status Order ID Comments User Context   04/22/2020 1030 04/24/2020 2028 DNR [id number]  Micheline Rough, MD Inpatient   04/20/2020 1807 04/22/2020 1029 Full Code [id number]  Jonnie Finner, DO ED   04/23/2019 1126 04/24/2019 1408 Full Code [id number]  [name], MD Inpatient   04/23/2019 0751 04/23/2019 1126 Full Code [id number]  [name], MD Inpatient   09/25/2017 1520 09/26/2017 1836 Full Code [id number]  Jovita Kussmaul, MD Inpatient   03/02/2017 1542 03/03/2017 1242 Full Code [id number]  Jovita Kussmaul, MD Inpatient   Advance Care Planning Activity      Prognosis:  Unable to determine  Discharge Planning: To Be Determined  Care plan was discussed with patient  Thank you for allowing the Palliative Medicine Team to assist in the care of this patient.   Time In: 1110 Time Out: 1150 Total Time 40 Prolonged Time Billed No      Greater than 50%  of this time was spent counseling and coordinating care related to the above assessment and plan.  Micheline Rough, MD  Please contact  Palliative Medicine Team phone at [phone] for questions and concerns.

## 2020-05-05 NOTE — Progress Notes (Addendum)
HEMATOLOGY-ONCOLOGY PROGRESS NOTE  Patient Care Team: [name], MD as PCP - General (Family Medicine) Rolm Bookbinder, MD as Attending Physician (Dermatology) Makaylah Oddo, Virgie Dad, MD as Consulting Physician (Oncology) Kyung Rudd, MD as Consulting Physician (Radiation Oncology) Jovita Kussmaul, MD as Consulting Physician (General Surgery) Irene Limbo, MD as Consulting Physician (Plastic Surgery) Delice Bison, Charlestine Massed, NP as Nurse Practitioner (Hematology and Oncology) [name], MD as Consulting Physician (Obstetrics and Gynecology) Laurey Morale, MD as Consulting Physician (Family Medicine) OTHER MD:  SUBJECTIVE: Brooke [name] is working with therapy at the time my visit.  Currently sitting on the side of the bed.  Still very weak overall.  She reports ongoing abdominal discomfort, nausea, vomiting.  States her bowels have not moved well about 3 to 4 days.  She is not currently having any headaches.  Oncology History Overview Note  Negative genetic testing   Malignant neoplasm of overlapping sites of left breast in female, estrogen receptor positive ([place])  01/26/2017 Initial Diagnosis   in the left breast, a cT2 pN1 invasive ductal carcinoma (with some lobular features but E-cadherin positive), grade 2, estrogen and progesterone receptor positive, HER-2 not amplified, with an MIB-1 of 15-20%             (a) breast MRI 02/04/2017 suggests a T3 N1 tumor   02/01/2017 Cancer Staging   Staging form: Breast, AJCC 8th Edition - Clinical stage from 02/01/2017: Stage IV (cT3, cN1, cM1, G2, ER+, PR+, HER2-) - Signed by Chauncey Cruel, MD on 03/05/2020 Staging comments: Staged at breast conference on 12.5.18   03/03/2017 - 08/01/2017 Neo-Adjuvant Chemotherapy   started neoadjuvant cyclophosphamide/docetaxel 03/03/2017, discontinued after 1 cycle with very poor tolerance             (a) started cyclophosphamide/methotrexate/fluorouracil (CMF) 03/28/2017, repeated x7 cycles, last dose  08/01/2017   09/25/2017 Surgery   in the right breast, a complex sclerosing lesion, status post lumpectomy, with no malignancy identified   09/25/2017 Surgery   status post left lumpectomy and axillary lymph node dissection 09/25/17 for a ypT3 ypN2, stage IIIA invasive ductal carcinoma, grade 2, again estrogen and progesterone receptor positive, HER-2 not amplified   10/11/2017 Cancer Staging   Staging form: Breast, AJCC 8th Edition - Pathologic: No Stage Recommended (ypT3, pN2a, cM1, G2, ER+, PR+, HER2-) - Signed by Chauncey Cruel, MD on 03/05/2020   11/07/2017 - 12/21/2017 Radiation Therapy   adjuvant radiation completed 12/21/2017             (a) capecitabine sensitization tolerated only the initial week of radiation (b) Site/dose: The patient initially received a dose of 50.4 Gy in 28 fractions to the left chest wall and left supraclavicular region. This was delivered using a 3-D conformal, 4 field technique. The patient then received a boost to the mastectomy scar. This delivered an additional 10 Gy in 5 fractions using an en face electron field. The total dose was 60.4 Gy.     12/12/2017 Genetic Testing   Negative genetic testing on the Multi-cancer panel.  The Multi-Gene Panel offered by [facility] includes sequencing and/or deletion duplication testing of the following 84 genes: AIP, ALK, APC, ATM, AXIN2,BAP1,  BARD1, BLM, BMPR1A, BRCA1, BRCA2, BRIP1, CASR, CDC73, CDH1, CDK4, CDKN1B, CDKN1C, CDKN2A (p14ARF), CDKN2A (p16INK4a), CEBPA, CHEK2, CTNNA1, DICER1, DIS3L2, EGFR (c.2369C>T, p.Thr790Met variant only), EPCAM (Deletion/duplication testing only), FH, FLCN, GATA2, GPC3, GREM1 (Promoter region deletion/duplication testing only), HOXB13 (c.251G>A, p.Gly84Glu), HRAS, KIT, MAX, MEN1, MET, MITF (c.952G>A, p.Glu318Lys variant only), MLH1, MSH2,  MSH3, MSH6, MUTYH, NBN, NF1, NF2, NTHL1, PALB2, PDGFRA, PHOX2B, PMS2, POLD1, POLE, POT1, PRKAR1A, PTCH1, PTEN, RAD50, RAD51C, RAD51D, RB1, RECQL4, RET,  RUNX1, SDHAF2, SDHA (sequence changes only), SDHB, SDHC, SDHD, SMAD4, SMARCA4, SMARCB1, SMARCE1, STK11, SUFU, TERC, TERT, TMEM127, TP53, TSC1, TSC2, VHL, WRN and WT1.  The report date is 12/12/2017.   02/2018 -  Anti-estrogen oral therapy   Tamoxifen daily    Miscellaneous   (8) Foundation 1 testing shows stable microsatellite status and 1 mutation/ Mb, TP53 mutations x2             (a) PD-L1 dated July 2019 negative     03/30/2020 -  Chemotherapy    Patient is on Treatment Plan: BREAST PACLITAXEL D1,8,15 Q28D      Port-A-Cath in place  Liver metastases ([place])  03/05/2020 Initial Diagnosis   Liver metastases ([place])   03/30/2020 -  Chemotherapy    Patient is on Treatment Plan: BREAST PACLITAXEL D1,8,15 Q28D      Lung metastases ([place])  03/05/2020 Initial Diagnosis   Lung metastases ([place])   03/30/2020 -  Chemotherapy    Patient is on Treatment Plan: BREAST PACLITAXEL D1,8,15 Q28D         REVIEW OF SYSTEMS:   A 14 point review of systems is noncontributory except as noted in the HPI.  Past Medical History:  Diagnosis Date   Anemia    Anxiety    Arthritis    ASCUS of cervix with negative high risk HPV 12/2018   Bilateral dry eyes    Breast cancer, left breast ([place])    Family history of breast cancer 02/02/2017   Family history of breast cancer    Family history of colon cancer 02/02/2017   Family history of colon cancer    Family history of prostate cancer    Family history of prostate cancer in father 02/02/2017   Family history of uterine cancer    Fibroid    History of kidney stones    Hyperlipidemia    Hypertension    Melanoma of upper arm ([place]) 2009   RIGHT ARM    Obesity (BMI 30.0-34.9)    Osteopenia 09/2017   T score -1.9 max 8% / 0.8% statistically significant decline at right and left hip stable at the spine   PONV (postoperative nausea and vomiting)    Squamous carcinoma     of the skin   UTI (urinary tract infection)    Varicose veins of both lower  extremities    Vitamin D deficiency 07/2009   LOW VITAMIN D 29   Vitamin deficiency 07/2008   VITAMIN D LOW 23   Past Surgical History:  Procedure Laterality Date   ABDOMINAL SURGERY  1975   ovairan cyst    APPENDECTOMY     BREAST LUMPECTOMY WITH RADIOACTIVE SEED LOCALIZATION Right 09/25/2017   Procedure: RIGHT BREAST LUMPECTOMY WITH RADIOACTIVE SEED LOCALIZATION;  Surgeon: Jovita Kussmaul, MD;  Location: [facility];  Service: General;  Laterality: Right;   BREAST SURGERY     bio   CHOLECYSTECTOMY     DERMOID CYST  EXCISION     DILATATION & CURETTAGE/HYSTEROSCOPY WITH MYOSURE N/A 04/23/2019   Procedure: DILATATION & CURETTAGE/HYSTEROSCOPY WITH MYOSURE;  Surgeon: [name], MD;  Location: [facility];  Service: Gynecology;  Laterality: N/A;  request to follow at 9:30am in [facility] block requests 45 minutes   [place]     HYSTEROSCOPY  insertion  port a cath  03/02/2016   MASTECTOMY MODIFIED RADICAL Left 09/25/2017   Procedure: LEFT MODIFIED RADICAL MASTECTOMY;  Surgeon: Jovita Kussmaul, MD;  Location: [facility];  Service: General;  Laterality: Left;   PORTACATH PLACEMENT N/A 03/02/2017   Procedure: INSERTION PORT-A-CATH;  Surgeon: Jovita Kussmaul, MD;  Location: [facility];  Service: General;  Laterality: N/A;   SKIN BIOPSY     SKIN CANCER EXCISION  2010,2011   2010-RIGHT ARM, 2011-LEFT LOWER LEG.-DR Rolm Bookbinder DERM. DR. Sarajane Jews IS HER DERM SURGEON.   FAMILY HISTORY      Family History  Problem Relation Age of Onset   Diabetes Father     Hypertension Father     Heart disease Father     Prostate cancer Father 46   Arthritis Father     Breast cancer Sister 43        again at age 69, GT results unk   Skin cancer Sister     Colon cancer Paternal Grandfather 20   Hypertension Mother     Heart failure Mother     Uterine cancer Sister 41        stage IV   Breast cancer Cousin          mat first cousin dx under 43   The patient's father was diagnosed with prostate cancer in his 26s and died at age 74 from complications of that disease.  The patient's mother died at age 45 with congestive heart failure.  The patient had no brothers, 3 sisters.  One sister had breast cancer at age 42 and again at age 72.  She has been genetically tested and was found to be BRCA not mutated in addition a paternal grandfather had colon cancer in his 35s.  There is no history of ovarian cancer in the family to the patient's knowledge.     GYNECOLOGIC HISTORY:  Patient's last menstrual period was 03/18/1997. Menarche age 55. The patient is GX P0.  She stopped having periods in 1999 and took hormone replacement approximately 5 years.     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  She is now retired.  She is single and lives alone with no pets.                    ADVANCED DIRECTIVES: Not in place     HEALTH MAINTENANCE: Social History        Tobacco Use   Smoking status: Never Smoker   Smokeless tobacco: Never Used  Vaping Use   Vaping Use: Never used  Substance Use Topics   Alcohol use: No   Drug use: No                Colonoscopy: Never             PAP: 12/2019, negative             Bone density: 2017/osteopenia  Allergies  Allergen Reactions   Neosporin [Neomycin-Bacitracin Zn-Polymyx] Swelling, Rash and Other (See Comments)    Blisters   Bactrim [Sulfamethoxazole-Trimethoprim]     Made her feel very sick   Benzalkonium Chloride Other (See Comments)    Redness (Pt is unaware)  It works by killing microorganisms and inhibiting their future growth, and for this reason frequently appears as an ingredient in antibacterial hand wipes, antiseptic creams and anti-itch ointments.   Latex Rash  PHYSICAL EXAMINATION: ECOG PERFORMANCE STATUS: 3 - Symptomatic, >50% confined to bed  Vitals:    05/04/20 1506 05/04/20 2046  BP: (!) 142/60 (!) 124/56  Pulse: 97 95  Resp: 16 17  Temp: 98.3 F (36.8 C) 99 F (37.2 C)  SpO2: (!) 88%    Filed Weights   05/02/20 1410  Weight: 82.6 kg    Intake/Output from previous day: 03/07 0701 - 03/08 0700 In: -  Out: 300 [Urine:300]  GENERAL: Chronically ill-appearing female, gagging at times due to nausea SKIN: skin color, texture, turgor are normal, no rashes or significant lesions OROPHARYNX: No thrush or mucositis LUNGS: clear to auscultation and percussion with normal breathing effort HEART: regular rate & rhythm and no murmurs, pitting edema to the bilateral lower extremities ABDOMEN: Positive bowel sounds, tenderness with palpation  NEURO: alert & oriented x 3 with fluent speech, no focal motor/sensory deficits  LABORATORY DATA:  I have reviewed the data as listed CMP Latest Ref Rng & Units 05/05/2020 05/04/2020 05/03/2020  Glucose 70 - 99 mg/dL 98 97 98  BUN 8 - 23 mg/dL _0 Creatinine 0.44 - 1.00 mg/dL 0.54 0.63 0.67  Sodium 135 - 145 mmol/L 138 140 140  Potassium 3.5 - 5.1 mmol/L 3.4(L) 3.4(L) 3.5  Chloride 98 - 111 mmol/L 105 105 106  CO2 22 - 32 mmol/L 20(L) 20(L) 22  Calcium 8.9 - 10.3 mg/dL 7.9(L) 7.6(L) 7.5(L)  Total Protein 6.5 - 8.1 g/dL - 4.5(L) 4.6(L)  Total Bilirubin 0.3 - 1.2 mg/dL - 1.9(H) 2.5(H)  Alkaline Phos 38 - 126 U/L - 169(H) 148(H)  AST 15 - 41 U/L - 103(H) 107(H)  ALT 0 - 44 U/L - 39 37    Lab Results  Component Value Date   WBC 3.7 (L) 05/04/2020   HGB 8.1 (L) 05/04/2020   HCT 25.3 (L) 05/04/2020   MCV 92.0 05/04/2020   PLT 94 (L) 05/04/2020   NEUTROABS 4.6 04/30/2020    DG Abd 1 View  Result Date: 05/02/2020 CLINICAL DATA:  The joint with nausea and vomiting. EXAM: ABDOMEN - 1 VIEW COMPARISON:  Radiograph 04/20/2020, but CT 03/05/2020 FINDINGS: No evidence of free air on this supine view. Slight increased air throughout not abnormally distended colon. Probable barium in the appendix.  There is a 2.2 cm stone in the region of the right renal pelvis. Granulomatous calcifications of the liver and spleen. Cholecystectomy clips in the right upper quadrant. Known osseous metastatic disease on prior PET is not well visualized by radiograph. IMPRESSION: 1. Nonobstructive bowel gas pattern. Mild increased air throughout the colon without abnormal distension is nonspecific in the setting. 2. Right renal stone. Electronically Signed   By: Keith Rake M.D.   On: 05/02/2020 20:13   CT Head Wo Contrast  Result Date: 05/02/2020 CLINICAL DATA:  70 year old female with metastatic breast cancer. Evaluate for brain metastases. EXAM: CT HEAD WITHOUT CONTRAST TECHNIQUE: Contiguous axial images were obtained from the base of the skull through the vertex without intravenous contrast. COMPARISON:  03/26/2020 brain MR FINDINGS: Brain: Parasagittal mass along the mid falx (series 2: Image 22-23) does not appear significantly changed from 03/26/2020 MR. The LEFT cerebellar lesion identified on recent MR is difficult to visualize on this study. No new masses/metastatic disease is identified on this noncontrast study. There is no evidence of acute infarct, midline shift, hydrocephalus or hemorrhage. Vascular: No hyperdense vessel or unexpected calcification. Skull: The known skull lesions identified on recent MR are difficult to visualize  on this study. Sinuses/Orbits: No acute abnormality Other: None IMPRESSION: 1. No acute abnormality. No new masses/metastatic disease on this noncontrast study. 2. No significant change in parafalcine mass/metastasis. 3. The LEFT cerebellar lesion and skull lesions identified on recent MR are difficult to visualize on this study. Electronically Signed   By: Margarette Canada M.D.   On: 05/02/2020 16:01   MR CERVICAL SPINE W WO CONTRAST  Result Date: 05/04/2020 CLINICAL DATA:  Breast carcinoma. Difficulty swallowing and lower extremity weakness. EXAM: MRI TOTAL SPINE WITHOUT AND WITH  CONTRAST TECHNIQUE: Multisequence MR imaging of the spine from the cervical spine to the sacrum was performed prior to and following IV contrast administration for evaluation of spinal metastatic disease. CONTRAST:  51m GADAVIST GADOBUTROL 1 MMOL/ML IV SOLN COMPARISON:  None. FINDINGS: MRI CERVICAL SPINE FINDINGS Alignment: Physiologic. Vertebrae: There is diffuse abnormal signal throughout the cervical vertebral column. Lesions involve the vertebral bodies and the spinous processes. There is no compression fracture. Cord: No abnormal enhancement in the spinal cord. No epidural enhancement. Posterior Fossa, vertebral arteries, paraspinal tissues: Incompletely visualized dural-based mass in the left posterior fossa, unchanged. Disc levels: Degenerative disc disease greatest at C5-6 and C6-7 without spinal canal stenosis. MRI THORACIC SPINE FINDINGS Alignment:  Physiologic. Vertebrae: Diffusely abnormal signal within the thoracic vertebral column with greatest lesion at T9. No compression fracture. Cord: There are 2 small foci of abnormal contrast enhancement along the left dorsal aspect of the distal spinal cord at the T12 level. No epidural enhancement. Paraspinal and other soft tissues: Negative Disc levels: No spinal canal stenosis MRI LUMBAR SPINE FINDINGS Segmentation:  Normal Alignment:  Normal Vertebrae: Diffusely abnormal bone marrow signal. No compression fracture. Conus medullaris: Extends to the L1 level and appears normal aside from the lesions at the T12 level described above. Paraspinal and other soft tissues: Negative Disc levels: No spinal canal stenosis. IMPRESSION: 1. Diffuse osseous metastatic disease without pathologic fracture. 2. 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 3. No spinal canal stenosis. Electronically Signed   By: KUlyses JarredM.D.   On: 05/04/2020 22:58   MR  THORACIC SPINE W WO CONTRAST  Result Date: 05/04/2020 CLINICAL DATA:  Breast carcinoma. Difficulty swallowing and lower extremity weakness. EXAM: MRI TOTAL SPINE WITHOUT AND WITH CONTRAST TECHNIQUE: Multisequence MR imaging of the spine from the cervical spine to the sacrum was performed prior to and following IV contrast administration for evaluation of spinal metastatic disease. CONTRAST:  832mGADAVIST GADOBUTROL 1 MMOL/ML IV SOLN COMPARISON:  None. FINDINGS: MRI CERVICAL SPINE FINDINGS Alignment: Physiologic. Vertebrae: There is diffuse abnormal signal throughout the cervical vertebral column. Lesions involve the vertebral bodies and the spinous processes. There is no compression fracture. Cord: No abnormal enhancement in the spinal cord. No epidural enhancement. Posterior Fossa, vertebral arteries, paraspinal tissues: Incompletely visualized dural-based mass in the left posterior fossa, unchanged. Disc levels: Degenerative disc disease greatest at C5-6 and C6-7 without spinal canal stenosis. MRI THORACIC SPINE FINDINGS Alignment:  Physiologic. Vertebrae: Diffusely abnormal signal within the thoracic vertebral column with greatest lesion at T9. No compression fracture. Cord: There are 2 small foci of abnormal contrast enhancement along the left dorsal aspect of the distal spinal cord at the T12 level. No epidural enhancement. Paraspinal and other soft tissues: Negative Disc levels: No spinal canal stenosis MRI LUMBAR SPINE FINDINGS Segmentation:  Normal Alignment:  Normal Vertebrae: Diffusely abnormal bone  marrow signal. No compression fracture. Conus medullaris: Extends to the L1 level and appears normal aside from the lesions at the T12 level described above. Paraspinal and other soft tissues: Negative Disc levels: No spinal canal stenosis. IMPRESSION: 1. Diffuse osseous metastatic disease without pathologic fracture. 2. Two small foci of abnormal contrast enhancement along the left dorsal aspect of the distal  spinal cord at the T12 level, likely CSF disseminated metastatic disease. 3. No spinal canal stenosis. Electronically Signed   By: Ulyses Jarred M.D.   On: 05/04/2020 22:58   MR Lumbar Spine W Wo Contrast  Result Date: 05/04/2020 CLINICAL DATA:  Breast carcinoma. Difficulty swallowing and lower extremity weakness. EXAM: MRI TOTAL SPINE WITHOUT AND WITH CONTRAST TECHNIQUE: Multisequence MR imaging of the spine from the cervical spine to the sacrum was performed prior to and following IV contrast administration for evaluation of spinal metastatic disease. CONTRAST:  81m GADAVIST GADOBUTROL 1 MMOL/ML IV SOLN COMPARISON:  None. FINDINGS: MRI CERVICAL SPINE FINDINGS Alignment: Physiologic. Vertebrae: There is diffuse abnormal signal throughout the cervical vertebral column. Lesions involve the vertebral bodies and the spinous processes. There is no compression fracture. Cord: No abnormal enhancement in the spinal cord. No epidural enhancement. Posterior Fossa, vertebral arteries, paraspinal tissues: Incompletely visualized dural-based mass in the left posterior fossa, unchanged. Disc levels: Degenerative disc disease greatest at C5-6 and C6-7 without spinal canal stenosis. MRI THORACIC SPINE FINDINGS Alignment:  Physiologic. Vertebrae: Diffusely abnormal signal within the thoracic vertebral column with greatest lesion at T9. No compression fracture. Cord: There are 2 small foci of abnormal contrast enhancement along the left dorsal aspect of the distal spinal cord at the T12 level. No epidural enhancement. Paraspinal and other soft tissues: Negative Disc levels: No spinal canal stenosis MRI LUMBAR SPINE FINDINGS Segmentation:  Normal Alignment:  Normal Vertebrae: Diffusely abnormal bone marrow signal. No compression fracture. Conus medullaris: Extends to the L1 level and appears normal aside from the lesions at the T12 level described above. Paraspinal and other soft tissues: Negative Disc levels: No spinal canal  stenosis. IMPRESSION: 1. Diffuse osseous metastatic disease without pathologic fracture. 2. 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 3. No spinal canal stenosis. Electronically Signed   By: KUlyses JarredM.D.   On: 05/04/2020 22:58   DG Chest Port 1 View  Result Date: 05/02/2020 CLINICAL DATA:  Weakness.  Nausea. EXAM: PORTABLE CHEST 1 VIEW COMPARISON:  Chest CT 02/27/2020 FINDINGS: Tip of the right upper extremity PICC in the SVC. Heart is borderline enlarged. Mild hilar prominence which may be due to adenopathy. There also calcified mediastinal and left hilar nodes. Many of the pulmonary nodules on prior CT are not seen. There is a calcified granuloma at the left lung base. No confluent consolidation. No pleural fluid. No pneumothorax. Postsurgical change of the left axilla and chest wall. Known osseous metastatic disease is not well detected by radiograph. IMPRESSION: 1. No acute findings. 2. Bilateral hilar prominence may be due to adenopathy in this patient with known metastatic breast cancer. 3. Right upper extremity PICC tip in the SVC. Electronically Signed   By: MKeith RakeM.D.   On: 05/02/2020 15:08   DG Abdomen Acute W/Chest  Result Date: 04/20/2020 CLINICAL DATA:  Vomiting cancer EXAM: DG ABDOMEN ACUTE WITH 1 VIEW CHEST COMPARISON:  CT chest 02/27/2020, PET CT 03/05/2020 FINDINGS: Single view chest demonstrates right upper extremity central  venous catheter tip at the cavoatrial junction. Postsurgical changes of left chest and axilla. No focal opacity or pleural effusion. Normal cardiomediastinal silhouette. Supine and upright views of the abdomen demonstrate no free air beneath the diaphragm. Surgical clips in the right upper quadrant. Multiple round calcifications in the left upper quadrant consistent with splenic granuloma. Calcified left lung base granuloma.  Nonobstructed gas pattern. Staghorn calculus right kidney. Known skeletal metastatic disease is better seen on PET CT. IMPRESSION: Nonobstructed gas pattern. Staghorn calculus right kidney. No radiographic evidence for acute cardiopulmonary abnormality. Electronically Signed   By: Donavan Foil M.D.   On: 04/20/2020 16:21   IR PICC PLACEMENT RIGHT >5 YRS INC IMG GUIDE  Result Date: 04/10/2020 INDICATION: Patient with history of metastatic breast cancer and poor venous access; central venous access requested for chemotherapy. EXAM: ULTRASOUND AND FLUOROSCOPIC GUIDED PICC LINE INSERTION MEDICATIONS: 1% lidocaine to skin and subcutaneous tissue ANESTHESIA/SEDATION: None FLUOROSCOPY TIME:  Fluoroscopy Time: 1 minutes 6 seconds (6 mGy). COMPLICATIONS: None immediate. TECHNIQUE: The procedure, risks, benefits, and alternatives were explained to the patient and informed written consent was obtained. A timeout was performed prior to the initiation of the procedure. The right upper extremity was prepped with chlorhexidine in a sterile fashion, and a sterile drape was applied covering the operative field. Maximum barrier sterile technique with sterile gowns and gloves were used for the procedure. A timeout was performed prior to the initiation of the procedure. Local anesthesia was provided with 1% lidocaine. Under direct ultrasound guidance, the right basilic vein was accessed with a micropuncture kit after the overlying soft tissues were anesthetized with 1% lidocaine. An ultrasound image was saved for documentation purposes. A guidewire was advanced to the level of the superior caval-atrial junction for measurement purposes and the PICC line was cut to length. A peel-away sheath was placed and a 47 cm, 5 [place], dual lumen was inserted to level of the superior caval-atrial junction. A post procedure spot fluoroscopic was obtained. The catheter easily aspirated and flushed and was sutured in place. A dressing was  placed. The patient tolerated the procedure well without immediate post procedural complication. FINDINGS: After catheter placement, the tip lies within the superior cavoatrial junction the catheter aspirates and flushes normally and is ready for immediate use. IMPRESSION: Successful ultrasound and fluoroscopic guided placement of a right basilic vein approach, 47 cm, 5 French, dual lumen PICC with tip at the superior caval-atrial junction. The PICC line is ready for immediate use. Read by: Rowe Robert, PA-C Electronically Signed   By: Jacqulynn Cadet M.D.   On: 04/10/2020 09:42   DG ESOPHAGUS W SINGLE CM (SOL OR THIN BA)  Result Date: 04/23/2020 CLINICAL DATA:  Dysphagia. EXAM: ESOPHOGRAM/BARIUM SWALLOW TECHNIQUE: Single contrast examination was performed using  thin barium. FLUOROSCOPY TIME:  Radiation Exposure Index (if provided by the fluoroscopic device): 17.9 mGy. COMPARISON:  None. FINDINGS: Exam is limited as the patient only drank a small amount of contrast. No definite mass or stricture is noted in the esophagus small sliding-type hiatal hernia is noted. No definite reflux is noted. Barium tablet could not be administered as the patient could not be placed in upright position. IMPRESSION: Small sliding-type hiatal hernia. No definite mass or stricture is noted in the esophagus. Electronically Signed   By: Marijo Conception M.D.   On: 04/23/2020 09:15   ASSESSMENT: 70 y.o. [place] woman status post bilateral biopsies 01/26/2017, showing   (1) in the right breast, a complex sclerosing lesion, status  post lumpectomy 09/25/2017, with no malignancy identified.   (2) in the left breast, a cT2 pN1 invasive ductal carcinoma (with some lobular features but E-cadherin positive), grade 2, estrogen and progesterone receptor positive, HER-2 not amplified, with an MIB-1 of 15-20%             (a) breast MRI 02/04/2017 suggests a T3 N1 tumor   (3) started neoadjuvant cyclophosphamide/docetaxel 03/03/2017,  discontinued after 1 cycle with very poor tolerance             (a) started cyclophosphamide/methotrexate/fluorouracil (CMF) 03/28/2017, repeated x7 cycles, last dose 08/01/2017   (4) status post left modified radical mastectomy 09/25/17 for a ypT5 ypN2, stage IIIA invasive ductal carcinoma, grade 2, again estrogen and progesterone receptor positive, HER-2 not amplified   (5) adjuvant radiation completed 12/21/2017             (a) capecitabine sensitization tolerated only the initial week of radiation (b) Site/dose: The patient initially received a dose of 50.4 Gy in 28 fractions to the left chest wall and left supraclavicular region. This was delivered using a 3-D conformal, 4 field technique. The patient then received a boost to the mastectomy scar. This delivered an additional 10 Gy in 5 fractions using an en face electron field. The total dose was 60.4 Gy.   ( 6) tamoxifen started 03/09/2018, discontinued January 2022 with metastatic progression   (7) genetics testing 12/12/2017 through the Multi-Gene Panel offered by [facility] found no deleterious mutations in AIP, ALK, APC, ATM, AXIN2,BAP1,  BARD1, BLM, BMPR1A, BRCA1, BRCA2, BRIP1, CASR, CDC73, CDH1, CDK4, CDKN1B, CDKN1C, CDKN2A (p14ARF), CDKN2A (p16INK4a), CEBPA, CHEK2, CTNNA1, DICER1, DIS3L2, EGFR (c.2369C>T, p.Thr790Met variant only), EPCAM (Deletion/duplication testing only), FH, FLCN, GATA2, GPC3, GREM1 (Promoter region deletion/duplication testing only), HOXB13 (c.251G>A, p.Gly84Glu), HRAS, KIT, MAX, MEN1, MET, MITF (c.952G>A, p.Glu318Lys variant only), MLH1, MSH2, MSH3, MSH6, MUTYH, NBN, NF1, NF2, NTHL1, PALB2, PDGFRA, PHOX2B, PMS2, POLD1, POLE, POT1, PRKAR1A, PTCH1, PTEN, RAD50, RAD51C, RAD51D, RB1, RECQL4, RET, RUNX1, SDHAF2, SDHA (sequence changes only), SDHB, SDHC, SDHD, SMAD4, SMARCA4, SMARCB1, SMARCE1, STK11, SUFU, TERC, TERT, TMEM127, TP53, TSC1, TSC2, VHL, WRN and WT1.    (8) Foundation 1 testing shows stable microsatellite status and  1 mutation/ Mb, TP53 mutations x2             (a) PD-L1 dated July 2019 negative   METASTATIC DISEASE: January 2022 (9) CT angio of the chest 02/27/2020 shows multiple hilar nodes consistent with prior granulomatous infection, multiple small pulmonary nodules, multiple punctate granulomas throughout the liver and spleen, nodular hepatic contour and areas of mottling possibly due to steatosis, 1.6 cm left adrenal nodule             (a) PET scan 03/05/2020 shows mildly to highly hypermetabolic adenopathy, the pulmonary nodules being mildly to not hypermetabolic, a large right hepatic liver lesion with an SUV of 13.2 and additional hypermetabolic right and left lobe foci, the adrenal lesion being hypermetabolic with an SUV of 16.  There are also peritoneal nodules and innumerable foci of bony involvement.  Incidental note was made of small heterogeneous foci of increased uptake in the cerebellum.  The CT images did not show gross abnormality in the visualized brain             (b) on 03/09/2020 CA 27-29 was 983.0, CEA 744.17, the CA 19-9 normal at 32             (c) liver biopsy 03/13/2020 confirms metastati carcinoma, estrogen receptor positive,  progesterone and HER 2 negative, with an Mib-1 of 40%             (d) brain MRI 03/26/2020 shows dural involvement including a 1.0 cm dural-based cerebellar lesion   (10) paclitaxel started 03/30/2020, to be repeated weekly as tolerated   (11) zoledronate started 03/19/2020, to be repeated every 12 weeks   (12) foundation 1 pending on the most recent biopsy     PLAN: Brooke [name] is about the same today.  She has ongoing abdominal discomfort, nausea, vomiting.  She also reports constipation today.  We will continue scheduled Zofran and Reglan as well as as needed Ativan and Compazine.  She was also started on IV dexamethasone on 3/7.  Will start her on MiraLAX daily for constipation.  She is now day 6 following dose #3 of paclitaxel.  She has been tolerating this  drug well and we have held in the past, it did not improve her symptoms.  She had an MRI of the spine performed yesterday which showed evidence of carcinomatosis, but this appears focal and not sure if this explains her symptoms.  A repeat MRI of the brain has been ordered for today.  A neurology consult could be a consideration.  If we do document carcinomatosis, we recommend hospice.   LOS: 2 days   Mikey Bussing, DNP, AGPCNP-BC, AOCNP 05/05/20   ADDENDUM: Brooke [name]'s situation is very difficult.  She is very motivated to receive treatment, and actually has tolerated Taxol so far without significant side effects.  It is not clear that she has had a response.  She does have palpable disease at the nape of the neck and just above the right eyebrow.  There is does not seem to have been disease progression in any case.  The MRI of the spine does show what appears to be somewhat focal carcinomatosis.  This could be confirmed with CSF sampling.  However I think it makes sense to go back and see if her brain MRI gives [place] more information.  If there is no carcinomatosis or at least no significant carcinomatosis I remain puzzled as to why she cannot swallow, and her legs are weak, and she was having trouble urinating.  I wonder if and neurology evaluation would be useful in the setting.  We will continue to follow closely with you.  I personally saw this patient and performed a substantive portion of this encounter with the listed APP documented above.   Chauncey Cruel, MD Medical Oncology and Hematology [facility] [address] [place], [place] [address] Tel. [phone]    Fax. [phone]

## 2020-05-05 NOTE — Evaluation (Signed)
Occupational Therapy Evaluation Patient Details Name: Brooke [name] MRN: [id number] DOB: 08-27-1950 Today's Date: 05/05/2020    History of Present Illness 70 year old female with history of metastatic breast cancer, hypertension, anxiety presented with intractable nausea vomiting and generalized weakness. Dx of UTI.   Clinical Impression   Patient is currently requiring assistance with ADLs including total assist with toileting, and LE dressing, maximum assist with bathing, and minimal assist with UE dressing all while seated or at bed level, and which is below patient's typical baseline of being Independent to Modified independent.  During this evaluation, patient was limited by severe generalized weakness, poor activity tolerance, and apparent depressed mood/flat affect, all of which has the potential to impact patient's safety and independence during functional mobility, as well as performance for ADLs. Currently, pt only able to stand with use of Denna Haggard and with moderate assist from very elevated bed.  [place] "6-clicks" Daily Activity Inpatient Short Form score of 14/24 indicates 59.67% ADL impairment this session. Patient lives alone with limited assistance available, and pt would be unable to return home and care for herself safely at her current level of function.  Patient demonstrates good rehab potential, and should benefit from continued skilled occupational therapy services while in acute care to maximize safety, independence and quality of life at home.  Continued occupational therapy services in a SNF setting prior to return home is recommended.  ?    Follow Up Recommendations  SNF    Equipment Recommendations  3 in 1 bedside commode;Tub/shower bench (Will defer to post-acute recommendations.)    Recommendations for Other Services       Precautions / Restrictions Precautions Precautions: Fall Restrictions Weight Bearing Restrictions: No      Mobility  Bed Mobility Overal bed mobility: Needs Assistance Bed Mobility: Supine to Sit;Sit to Supine     Supine to sit: Mod assist;HOB elevated Sit to supine: Mod assist   General bed mobility comments: Mod As also to scoot anteriorly to EOB. Patient Response: Cooperative  Transfers Overall transfer level: Needs assistance   Transfers: Sit to/from Stand Sit to Stand: Mod assist;From elevated surface         General transfer comment: Pt attempted 1 stand to [facility] with EOB elevated partially, but unable to come fully upright.  Increased elevation of EOB and pt given instruction/cues with 3 count and anterior rocking to promote anterior weight shift and pt able to stand from high EOB x 3 reps with moderate assist and cues. Pt with poor standing tolerance with each stand <20 seconds. Pt cued for slow, controlled descent to EOB with good use of Ues on Stedy and Mod As for safety.    Balance Overall balance assessment: Needs assistance Sitting-balance support: Feet supported;Single extremity supported Sitting balance-Leahy Scale: Fair Sitting balance - Comments: Posteior lean when raising UEs overhead but no LOB.   Standing balance support: Bilateral upper extremity supported Standing balance-Leahy Scale: Poor Standing balance comment: On Stedy.                           ADL either performed or assessed with clinical judgement   ADL Overall ADL's : Needs assistance/impaired Eating/Feeding: Independent   Grooming: Set up;Sitting   Upper Body Bathing: Sitting;Minimal assistance   Lower Body Bathing: Maximal assistance;Sitting/lateral leans;Sit to/from stand   Upper Body Dressing : Minimal assistance;Sitting   Lower Body Dressing: Total assistance;Sitting/lateral leans Lower Body Dressing Details (indicate cue type and  reason): To don socks. Bil foot edema noted.   Toilet Transfer Details (indicate cue type and reason): Pre-training with Denna Haggard. Please see Mobility  section. Toileting- Clothing Manipulation and Hygiene: Total assistance Toileting - Clothing Manipulation Details (indicate cue type and reason): Remains on Foley catheter. No BM x4 days per pt report.     Functional mobility during ADLs: Moderate assistance General ADL Comments: Denna Haggard usng for Sit<>stands     Vision Baseline Vision/History: Wears glasses Wears Glasses: At all times Vision Assessment?: No apparent visual deficits     Perception     Praxis      Pertinent Vitals/Pain Pain Assessment: No/denies pain     Hand Dominance Left   Extremity/Trunk Assessment Upper Extremity Assessment Upper Extremity Assessment: Generalized weakness;RUE deficits/detail;LUE deficits/detail RUE Deficits / Details: edematous forearm and elbow areas. Grossly 3 to 3+/5 RUE Sensation: WNL RUE Coordination: WNL (decreased grip strength) LUE Deficits / Details: Grossly 3+ to 4-/5.   Lower Extremity Assessment Lower Extremity Assessment: Generalized weakness RLE Deficits / Details: Per PT Note: hip +2/5, knee ext -3/5, B feet/ankle edematous LLE Deficits / Details: Per PT note: knee ext +3/5, hip +3/5   Cervical / Trunk Assessment Cervical / Trunk Assessment: Normal   Communication Communication Communication: No difficulties   Cognition Arousal/Alertness: Awake/alert Behavior During Therapy: WFL for tasks assessed/performed;Flat affect Overall Cognitive Status: Within Functional Limits for tasks assessed                                 General Comments: Oriented to all but month stating: "January; no February.  March."  Very flat at beginning of session, but more animated after standing.   General Comments       Exercises     Shoulder Instructions      Home Living Family/patient expects to be discharged to:: Skilled nursing facility Living Arrangements: Alone Available Help at Discharge:  (pt stated she doesn't have assistance available, some family are  visiting on 05/10/20 from "other parts of the [place]" but can only stay a few days.) Type of Home: Apartment Home Access: Stairs to enter [employer] of Steps: 3 Entrance Stairs-Rails: Left Home Layout: One level     Bathroom Shower/Tub: Teacher, early years/pre: Standard     Home Equipment: Environmental consultant - 2 wheels          Prior Functioning/Environment Level of Independence: Independent with assistive device(s)        Comments: started walking with RW just recently, denies falls in past year        OT Problem List: Decreased strength;Increased edema;Decreased activity tolerance;Impaired balance (sitting and/or standing);Decreased knowledge of use of DME or AE      OT Treatment/Interventions: Self-care/ADL training;Therapeutic exercise;Therapeutic activities;Cognitive remediation/compensation;Energy conservation;DME and/or AE instruction;Patient/family education;Balance training    OT Goals(Current goals can be found in the care plan section) Acute Rehab OT Goals Patient Stated Goal: hopes to DC home but agreeable to SNF if needed OT Goal Formulation: With patient Time For Goal Achievement: 05/19/20 Potential to Achieve Goals: Good ADL Goals Pt Will Perform Lower Body Dressing: with adaptive equipment;sitting/lateral leans;sit to/from stand;with min assist Pt Will Transfer to Toilet: with min assist;stand pivot transfer;bedside commode Pt Will Perform Toileting - Clothing Manipulation and hygiene: with min assist;with adaptive equipment;sitting/lateral leans;sit to/from stand Pt/caregiver will Perform Home Exercise Program: Increased strength;Both right and left upper extremity;With Supervision Additional ADL Goal #1:  Pt will improve sitting balance to good without use of UEs in order to increase safety and participation during seated ADLs.  OT Frequency: Min 2X/week   Barriers to D/C: Decreased caregiver support          Co-evaluation               AM-PAC OT "6 Clicks" Daily Activity     Outcome Measure Help from another person eating meals?: None Help from another person taking care of personal grooming?: A Little Help from another person toileting, which includes using toliet, bedpan, or urinal?: Total Help from another person bathing (including washing, rinsing, drying)?: A Lot Help from another person to put on and taking off regular upper body clothing?: A Little Help from another person to put on and taking off regular lower body clothing?: Total 6 Click Score: 14   End of Session Equipment Utilized During Treatment: Gait belt Denna Haggard) Nurse Communication: Mobility status;Other (comment) (IV beeping)  Activity Tolerance: Patient limited by fatigue Patient left: in bed;with call bell/phone within reach;with bed alarm set  OT Visit Diagnosis: Unsteadiness on feet (R26.81);Muscle weakness (generalized) (M62.81)                Time: 3220-2542 OT Time Calculation (min): 31 min Charges:  OT General Charges $OT Visit: 1 Visit OT Evaluation $OT Eval Low Complexity: 1 Low OT Treatments $Therapeutic Activity: 8-22 mins  Anderson Malta, OT Acute Rehab Services Office: [phone] 05/05/2020  Julien Girt 05/05/2020, 10:34 AM

## 2020-05-05 NOTE — Progress Notes (Signed)
Initial Nutrition Assessment  DOCUMENTATION CODES:   Non-severe (moderate) malnutrition in context of chronic illness  INTERVENTION:   -Boost Breeze po TID, each supplement provides 250 kcal and 9 grams of protein  -Encouraged PO intakes  NUTRITION DIAGNOSIS:   Moderate Malnutrition related to chronic illness,cancer and cancer related treatments as evidenced by percent weight loss,energy intake < or equal to 75% for > or equal to 1 month,mild fat depletion,mild muscle depletion.  GOAL:   Patient will meet greater than or equal to 90% of their needs  MONITOR:   PO intake,Supplement acceptance,Labs,Weight trends,I & O's  REASON FOR ASSESSMENT:   Malnutrition Screening Tool    ASSESSMENT:   70 year old female with history of metastatic breast cancer, hypertension, anxiety presented with intractable nausea vomiting and generalized weakness.  She continues to have chemotherapy with Dr. [name].  Patient cancer center yesterday for care session.  When she went home she became very weak and was unable to get up from chair.  She was concerned so she came back to the ED.  She was diagnosed with UTI by oncologist few days ago.  Pt in room, lethargic. Pt reports that last time she ate solid food was "a long time ago". Currently having difficulty with liquids. Pt reports she has very dry mouth and is only wanting ice chips at this time. Denies any nausea at this time.  RD offered to get pt some jello or juice and pt declines. Pt is not interested in anything RD has offered. Will order Boost Breeze but do not feel confident pt will drink.   Per SLP note, pt with mild-moderate aspiration risk. Recommending thin liquids at this time.  Per palliative care note, pt is continuing to be full code. Will continue to monitor for plan of care going forward.   Spoke with pt about a feeding tube briefly and pt verbalized that she is no longer considering a feeding tube as an option for her.   Pt is  at high risk of worsening malnutrition.   Per weight records, pt has lost 17 lbs since 1/20 (8% wt loss x 1.5 months, significant for time frame). Pt with severe edema as well.   Medications: IV Zofran, Miralax, KCl  Labs reviewed: Low K   NUTRITION - FOCUSED PHYSICAL EXAM:  Flowsheet Row Most Recent Value  Orbital Region Mild depletion  Upper Arm Region Mild depletion  Thoracic and Lumbar Region Unable to assess  Buccal Region Mild depletion  Temple Region Mild depletion  Clavicle Bone Region Unable to assess  Clavicle and Acromion Bone Region Unable to assess  Scapular Bone Region Unable to assess  Dorsal Hand Mild depletion  Patellar Region Unable to assess  Anterior Thigh Region Unable to assess  Posterior Calf Region Unable to assess  Edema (RD Assessment) Severe       Diet Order:   Diet Order            Diet clear liquid Room service appropriate? Yes; Fluid consistency: Thin  Diet effective now                 EDUCATION NEEDS:   No education needs have been identified at this time  Skin:  Skin Assessment: Reviewed RN Assessment  Last BM:  PTA  Height:   Ht Readings from Last 1 Encounters:  05/02/20 5\' 9"  (1.753 m)    Weight:   Wt Readings from Last 1 Encounters:  05/02/20 82.6 kg   BMI:  Body mass index  is 26.88 kg/m.  Estimated Nutritional Needs:   Kcal:  2000-2200  Protein:  110-120g  Fluid:  2L/day   Clayton Bibles, MS, RD, LDN Inpatient Clinical Dietitian Contact information available via Amion

## 2020-05-05 NOTE — Progress Notes (Signed)
[facility]  PROGRESS NOTE  Brooke [name] CWC:[id number] DOB: May 21, 1950 DOA: 05/02/2020 PCP: [name], MD   Brief HPI:   70 year old female with history of metastatic breast cancer, hypertension, anxiety presented with intractable nausea vomiting and generalized weakness.  She continues to have chemotherapy with Dr. [name].  Patient cancer center yesterday for care session.  When she went home she became very weak and was unable to get up from chair.  She was concerned so she came back to the ED.  She was diagnosed with UTI by oncologist few days ago.  She was prescribed antibiotics but had not been able to pick it up from pharmacy. In the ED she complained of nausea.  Start on IV fluids.   Subjective   Patient seen and examined, complains of dry mouth this morning.  Denies pain or nausea at this time.   Assessment/Plan:     1. Generalized weakness/intractable nausea vomiting-patient started on Reglan, Ativan, IV fluids, Protonix.  Started on clear liquid diet.  KUB showed nonobstructive bowel gas pattern.  Mild increased air throughout the colon without abdominal distention which is nonspecific.  Right renal stone.  Meds adjusted per oncology. 2. Dry mouth-we we will change Reglan to as needed.  3. UTI-urine culture from 04/30/2020 showed 20,000 colonies per mL of Enterococcus faecalis.  It is sensitive to ampicillin, continue with ampicillin 1 g IV every 6 hours.  Can switch to nitrofurantoin at discharge. 4. Hypokalemia-potassium was 3.4, will replace potassium.  Follow BMP in am. 5. Metastatic breast cancer-patient is on chemotherapy as per oncology.  Palliative care consultation has been obtained. 6. Spinal metastasis-MRI of cervical thoracic and lumbar spine showed diffuse osseous metastatic disease.  Also shows 2 foci of abnormal contrast enhancement along the left dorsal aspect of distal spinal cord at T12 level.  MRI brain ordered per oncology.  If it shows diffuse  metastasis, oncology will recommend hospice. 7. Anemia of chronic disease-likely  from underlying malignancy, today hemoglobin is 7.9.  Follow hemoglobin and transfuse for hemoglobin less than 7. 8. Hypokalemia-replace potassium, follow BMP in am. 9. Urinary retention-continue Foley catheter     COVID-19 Labs  No results for input(s): DDIMER, FERRITIN, LDH, CRP in the last 72 hours.  Lab Results  Component Value Date   SARSCOV2NAA NEGATIVE 05/02/2020   [place] NEGATIVE 04/20/2020   [place] NEGATIVE 04/19/2019     Scheduled medications:   . Chlorhexidine Gluconate Cloth  6 each Topical Daily  . dexamethasone  4 mg Intravenous Q12H  . feeding supplement  1 Container Oral TID BM  . losartan  12.5 mg Oral Daily  . ondansetron (ZOFRAN) IV  4 mg Intravenous Q8H  . pantoprazole (PROTONIX) IV  40 mg Intravenous Q24H  . polyethylene glycol  17 g Oral Daily         SpO2: 91 %    CBC: Recent Labs  Lab 04/30/20 0817 05/02/20 1524 05/03/20 0609 05/04/20 0527  WBC 6.0 4.6 3.8* 3.7*  NEUTROABS 4.6  --   --   --   HGB 9.6* 9.3* 7.9* 8.1*  HCT 29.5* 28.8* 24.3* 25.3*  MCV 88.1 90.0 90.3 92.0  PLT 144* 129* 91* 94*    Basic Metabolic Panel: Recent Labs  Lab 04/30/20 0817 05/02/20 1524 05/03/20 0609 05/04/20 0527 05/05/20 0459  NA 138 139 140 140 138  K 3.3* 2.9* 3.5 3.4* 3.4*  CL 103 103 106 105 105  CO2 19* 20* 22 20* 20*  GLUCOSE 102*  103* 98 97 98  BUN 10 13 11 10 10   CREATININE 0.72 0.78 0.67 0.63 0.54  CALCIUM 8.1* 7.9* 7.5* 7.6* 7.9*  MG  --  2.1  --   --   --   PHOS  --  3.0  --   --   --      Liver Function Tests: Recent Labs  Lab 04/30/20 0817 05/02/20 1524 05/03/20 0609 05/04/20 0527  AST 101* 129* 107* 103*  ALT 36 43 37 39  ALKPHOS 205* 194* 148* 169*  BILITOT 1.7* 2.6* 2.5*  2.4* 1.9*  PROT 5.1* 5.4* 4.6* 4.5*  ALBUMIN 2.9* 3.1* 2.6* 2.6*     Antibiotics: Anti-infectives (From admission, onward)   Start     Dose/Rate Route  Frequency Ordered Stop   05/02/20 1800  ampicillin (OMNIPEN) 1 g in sodium chloride 0.9 % 100 mL IVPB        1 g 300 mL/hr over 20 Minutes Intravenous Every 6 hours 05/02/20 1722         DVT prophylaxis: SCDs  Code Status: DNR  Family Communication: Discussed with sister at bedside   Consultants:    Procedures:      Objective   Vitals:   05/04/20 0557 05/04/20 1506 05/04/20 2046 05/05/20 1547  BP: (!) 160/54 (!) 142/60 (!) 124/56 139/61  Pulse: 98 97 95 (!) 101  Resp: 20 16 17 18   Temp: 98.3 F (36.8 C) 98.3 F (36.8 C) 99 F (37.2 C)   TempSrc: Oral Oral Oral   SpO2: 91% (!) 88%  91%  Weight:      Height:        Intake/Output Summary (Last 24 hours) at 05/05/2020 1638 Last data filed at 05/05/2020 1610 Gross per 24 hour  Intake --  Output 1100 ml  Net -1100 ml    03/06 1901 - 03/08 0700 In: 1042.6 [I.V.:842.6] Out: 625 [Urine:625]  Filed Weights   05/02/20 1410  Weight: 82.6 kg    Physical Examination:   General-appears in no acute distress Heart-S1-S2, regular, no murmur auscultated Lungs-clear to auscultation bilaterally, no wheezing or crackles auscultated Abdomen-soft, nontender, no organomegaly Extremities-no edema in the lower extremities Neuro-alert, oriented x3, no focal deficit noted  Status is: Inpatient  Dispo: The patient is from: Home              Anticipated d/c is to:  skilled nursing facility              Anticipated d/c date is: 05/07/2020              Patient currently not medically stable for discharge  Barrier to discharge-treatment for nausea and vomiting       Data Reviewed:   Recent Results (from the past 240 hour(s))  Culture, Urine     Status: Abnormal   Collection Time: 04/30/20  1:00 PM   Specimen: Urine, Clean Catch  Result Value Ref Range Status   Specimen Description   Final    URINE, CLEAN CATCH Performed at [facility], [address] [place], [place] [address]     Special Requests   Final    NONE Performed at [facility], [place] [address], [place], [place] [address]    Culture 20,000 COLONIES/mL ENTEROCOCCUS FAECALIS (A)  Final   Report Status 05/02/2020 FINAL  Final   Organism ID, Bacteria ENTEROCOCCUS FAECALIS (A)  Final      Susceptibility   Enterococcus faecalis -  MIC*    AMPICILLIN <=2 SENSITIVE Sensitive     NITROFURANTOIN <=16 SENSITIVE Sensitive     VANCOMYCIN 2 SENSITIVE Sensitive     * 20,000 COLONIES/mL ENTEROCOCCUS FAECALIS  SARS CORONAVIRUS 2 (TAT 6-24 HRS) Nasopharyngeal Nasopharyngeal Swab     Status: None   Collection Time: 05/02/20  7:15 PM   Specimen: Nasopharyngeal Swab  Result Value Ref Range Status   SARS Coronavirus 2 NEGATIVE NEGATIVE Final    Comment: (NOTE) SARS-CoV-2 target nucleic acids are NOT DETECTED.  The SARS-CoV-2 RNA is generally detectable in upper and lower respiratory specimens during the acute phase of infection. Negative results do not preclude SARS-CoV-2 infection, do not rule out co-infections with other pathogens, and should not be used as the sole basis for treatment or other patient management decisions. Negative results must be combined with clinical observations, patient history, and epidemiological information. The expected result is Negative.  Fact Sheet for Patients: SugarRoll.be  Fact Sheet for Healthcare Providers: https://www.woods-mathews.com/  This test is not yet approved or cleared by the [place] FDA and  has been authorized for detection and/or diagnosis of SARS-CoV-2 by FDA under an Emergency Use Authorization (EUA). This EUA will remain  in effect (meaning this test can be used) for the duration of the COVID-19 declaration under Se ction 564(b)(1) of the Act, 21 U.S.C. section 360bbb-3(b)(1), unless the authorization is terminated or revoked sooner.  Performed at [facility], [place] [address],  [place], [place] [address]     Recent Labs  Lab 05/02/20 1524  LIPASE 69*   No results for input(s): AMMONIA in the last 168 hours.  Cardiac Enzymes: No results for input(s): CKTOTAL, CKMB, CKMBINDEX, TROPONINI in the last 168 hours. BNP (last 3 results) No results for input(s): BNP in the last 8760 hours.  ProBNP (last 3 results) No results for input(s): PROBNP in the last 8760 hours.  Studies:  MR CERVICAL SPINE W WO CONTRAST  Result Date: 05/04/2020 CLINICAL DATA:  Breast carcinoma. Difficulty swallowing and lower extremity weakness. EXAM: MRI TOTAL SPINE WITHOUT AND WITH CONTRAST TECHNIQUE: Multisequence MR imaging of the spine from the cervical spine to the sacrum was performed prior to and following IV contrast administration for evaluation of spinal metastatic disease. CONTRAST:  59mL GADAVIST GADOBUTROL 1 MMOL/ML IV SOLN COMPARISON:  None. FINDINGS: MRI CERVICAL SPINE FINDINGS Alignment: Physiologic. Vertebrae: There is diffuse abnormal signal throughout the cervical vertebral column. Lesions involve the vertebral bodies and the spinous processes. There is no compression fracture. Cord: No abnormal enhancement in the spinal cord. No epidural enhancement. Posterior Fossa, vertebral arteries, paraspinal tissues: Incompletely visualized dural-based mass in the left posterior fossa, unchanged. Disc levels: Degenerative disc disease greatest at C5-6 and C6-7 without spinal canal stenosis. MRI THORACIC SPINE FINDINGS Alignment:  Physiologic. Vertebrae: Diffusely abnormal signal within the thoracic vertebral column with greatest lesion at T9. No compression fracture. Cord: There are 2 small foci of abnormal contrast enhancement along the left dorsal aspect of the distal spinal cord at the T12 level. No epidural enhancement. Paraspinal and other soft tissues: Negative Disc levels: No spinal canal stenosis MRI LUMBAR SPINE FINDINGS Segmentation:  Normal Alignment:  Normal Vertebrae: Diffusely abnormal  bone marrow signal. No compression fracture. Conus medullaris: Extends to the L1 level and appears normal aside from the lesions at the T12 level described above. Paraspinal and other soft tissues: Negative Disc levels: No spinal canal stenosis. IMPRESSION: 1. Diffuse osseous metastatic disease without pathologic fracture. 2. Two small foci  of abnormal contrast enhancement along the left dorsal aspect of the distal spinal cord at the T12 level, likely CSF disseminated metastatic disease. 3. No spinal canal stenosis. Electronically Signed   By: Ulyses Jarred M.D.   On: 05/04/2020 22:58   MR THORACIC SPINE W WO CONTRAST  Result Date: 05/04/2020 CLINICAL DATA:  Breast carcinoma. Difficulty swallowing and lower extremity weakness. EXAM: MRI TOTAL SPINE WITHOUT AND WITH CONTRAST TECHNIQUE: Multisequence MR imaging of the spine from the cervical spine to the sacrum was performed prior to and following IV contrast administration for evaluation of spinal metastatic disease. CONTRAST:  67mL GADAVIST GADOBUTROL 1 MMOL/ML IV SOLN COMPARISON:  None. FINDINGS: MRI CERVICAL SPINE FINDINGS Alignment: Physiologic. Vertebrae: There is diffuse abnormal signal throughout the cervical vertebral column. Lesions involve the vertebral bodies and the spinous processes. There is no compression fracture. Cord: No abnormal enhancement in the spinal cord. No epidural enhancement. Posterior Fossa, vertebral arteries, paraspinal tissues: Incompletely visualized dural-based mass in the left posterior fossa, unchanged. Disc levels: Degenerative disc disease greatest at C5-6 and C6-7 without spinal canal stenosis. MRI THORACIC SPINE FINDINGS Alignment:  Physiologic. Vertebrae: Diffusely abnormal signal within the thoracic vertebral column with greatest lesion at T9. No compression fracture. Cord: There are 2 small foci of abnormal contrast enhancement along the left dorsal aspect of the distal spinal cord at the T12 level. No epidural  enhancement. Paraspinal and other soft tissues: Negative Disc levels: No spinal canal stenosis MRI LUMBAR SPINE FINDINGS Segmentation:  Normal Alignment:  Normal Vertebrae: Diffusely abnormal bone marrow signal. No compression fracture. Conus medullaris: Extends to the L1 level and appears normal aside from the lesions at the T12 level described above. Paraspinal and other soft tissues: Negative Disc levels: No spinal canal stenosis. IMPRESSION: 1. Diffuse osseous metastatic disease without pathologic fracture. 2. 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 3. No spinal canal stenosis. Electronically Signed   By: Ulyses Jarred M.D.   On: 05/04/2020 22:58   MR Lumbar Spine W Wo Contrast  Result Date: 05/04/2020 CLINICAL DATA:  Breast carcinoma. Difficulty swallowing and lower extremity weakness. EXAM: MRI TOTAL SPINE WITHOUT AND WITH CONTRAST TECHNIQUE: Multisequence MR imaging of the spine from the cervical spine to the sacrum was performed prior to and following IV contrast administration for evaluation of spinal metastatic disease. CONTRAST:  58mL GADAVIST GADOBUTROL 1 MMOL/ML IV SOLN COMPARISON:  None. FINDINGS: MRI CERVICAL SPINE FINDINGS Alignment: Physiologic. Vertebrae: There is diffuse abnormal signal throughout the cervical vertebral column. Lesions involve the vertebral bodies and the spinous processes. There is no compression fracture. Cord: No abnormal enhancement in the spinal cord. No epidural enhancement. Posterior Fossa, vertebral arteries, paraspinal tissues: Incompletely visualized dural-based mass in the left posterior fossa, unchanged. Disc levels: Degenerative disc disease greatest at C5-6 and C6-7 without spinal canal stenosis. MRI THORACIC SPINE FINDINGS Alignment:  Physiologic. Vertebrae: Diffusely abnormal signal within the thoracic vertebral column with greatest lesion  at T9. No compression fracture. Cord: There are 2 small foci of abnormal contrast enhancement along the left dorsal aspect of the distal spinal cord at the T12 level. No epidural enhancement. Paraspinal and other soft tissues: Negative Disc levels: No spinal canal stenosis MRI LUMBAR SPINE FINDINGS Segmentation:  Normal Alignment:  Normal Vertebrae: Diffusely abnormal bone marrow signal. No compression fracture. Conus medullaris: Extends to the L1 level and appears normal aside from the lesions at  the T12 level described above. Paraspinal and other soft tissues: Negative Disc levels: No spinal canal stenosis. IMPRESSION: 1. Diffuse osseous metastatic disease without pathologic fracture. 2. 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 3. No spinal canal stenosis. Electronically Signed   By: Ulyses Jarred M.D.   On: 05/04/2020 22:58       [place]   [facility] If 7PM-7AM, please contact night-coverage at [email or web address], Office  [phone]   05/05/2020, 4:38 PM  LOS: 2 days

## 2020-05-05 NOTE — Progress Notes (Signed)
Discussed results or spinal MRIs with [facility] today. There is evidence of carcinomatosis, but this appears focal and I am not sure it explains her symptomatology. She is agreeable to repeat brain MRI and I have written for that-- creatinine this AM is fine.  I wonder if a neurology consult would be helpful at this time.   If we do document carcinomatosis I will recommend Hospice  Will follow with you  GM

## 2020-05-06 ENCOUNTER — Inpatient Hospital Stay (HOSPITAL_COMMUNITY): Payer: Medicare PPO

## 2020-05-06 ENCOUNTER — Inpatient Hospital Stay: Payer: Medicare PPO

## 2020-05-06 DIAGNOSIS — R112 Nausea with vomiting, unspecified: Secondary | ICD-10-CM | POA: Diagnosis not present

## 2020-05-06 DIAGNOSIS — C7801 Secondary malignant neoplasm of right lung: Secondary | ICD-10-CM | POA: Diagnosis not present

## 2020-05-06 DIAGNOSIS — E876 Hypokalemia: Secondary | ICD-10-CM | POA: Diagnosis not present

## 2020-05-06 DIAGNOSIS — Z7189 Other specified counseling: Secondary | ICD-10-CM | POA: Diagnosis not present

## 2020-05-06 DIAGNOSIS — C7951 Secondary malignant neoplasm of bone: Secondary | ICD-10-CM | POA: Diagnosis not present

## 2020-05-06 MED ORDER — GADOBUTROL 1 MMOL/ML IV SOLN
10.0000 mL | Freq: Once | INTRAVENOUS | Status: AC | PRN
  Administered 2020-05-06: 10 mL via INTRAVENOUS

## 2020-05-06 MED ORDER — NITROFURANTOIN MONOHYD MACRO 100 MG PO CAPS
100.0000 mg | ORAL_CAPSULE | Freq: Two times a day (BID) | ORAL | Status: AC
  Administered 2020-05-06 – 2020-05-07 (×4): 100 mg via ORAL
  Filled 2020-05-06 (×6): qty 1

## 2020-05-06 NOTE — Progress Notes (Addendum)
  Speech Language Pathology Treatment: Dysphagia  Patient Details Name: Brooke [name] MRN: [id number] DOB: 01-14-1951 Today's Date: 05/06/2020 Time: 1035-1100 SLP Time Calculation (min) (ACUTE ONLY): 25 min  Assessment / Plan / Recommendation Clinical Impression  Pt seen at bedside for skilled ST intervention to assess tolerance of clear liquid diet, and to determine appropriateness for advanced textures. RN reports tolerance of PO meds without difficulty, but otherwise minimal intake outside of ice chips. Lungs CTA, no temp spikes. Pt was pleasant and cooperative with unfamiliar therapist, and was agreeable to trials of chocolate ice cream. Pt tolerated multiple boluses of ice cream and spoonsful of ice chips without overt s/s aspiration. When asked if she would like to try more "solid" consistencies (puree, soft solids,etc) pt politely declined. Will advance diet to full liquid at this time, to provide more choices to encourage increased PO intake. SLP will continue to follow acutely per POC. No instrumental study is recommended at this time.    HPI HPI: 70 year old female with history of metastatic breast cancer, hypertension, anxiety presented with intractable nausea vomiting and generalized weakness. Dx of UTI. Brain MRI pending. Palliative on board      SLP Plan  Continue with current plan of care       Recommendations  Diet recommendations: Other(comment);Thin liquid (full liquid) Liquids provided via: Teaspoon Medication Administration: Other (Comment) (liquid or IV if possible) Supervision: Patient able to self feed Compensations: Slow rate;Small sips/bites Postural Changes and/or Swallow Maneuvers: Seated upright 90 degrees;Upright 30-60 min after meal                Oral Care Recommendations: Oral care BID Follow up Recommendations: None SLP Visit Diagnosis: Dysphagia, unspecified (R13.10) Plan: Continue with current plan of care       GO               Brooke [name]  Brooke [name], Brooke [name], Brooke [name] Speech Language Pathologist Office: [phone] Pager: [phone]  Brooke [name] 05/06/2020, 11:10 AM

## 2020-05-06 NOTE — TOC Progression Note (Signed)
Transition of Care [facility]) - Progression Note    Patient Details  Name: Brooke [name] MRN: [id number] Date of Birth: 17-Feb-1951  [facility] CM/SW Contact  Eilleen Davoli, Marjie Skiff, RN Phone Number: 05/06/2020, 2:42 PM  Clinical Narrative:    Pt provided with Medicare.gov list of SNF bed offers. Pt did not want to make decision yet on facility. She was encouraged to call and talk over the options with her sister. Auth started with [facility] for SNF. Ref# [id number]   Expected Discharge Plan: Skilled Nursing Facility Barriers to Discharge: Continued Medical Work up  Expected Discharge Plan and Services Expected Discharge Plan: [place]    Readmission Risk Interventions Readmission Risk Prevention Plan 05/04/2020  Transportation Screening Complete  PCP or Specialist Appt within 3-5 Days Complete  HRI or Home Care Consult Complete  Social Work Consult for [place] Planning/Counseling Complete  Palliative Care Screening Complete  Medication Review [employer]) Complete  Some recent data might be hidden

## 2020-05-06 NOTE — Progress Notes (Signed)
PROGRESS NOTE    Brooke [name]  PJK:[id number] DOB: 1950/09/15 DOA: 05/02/2020 PCP: [name], MD   Chief Complaint  Patient presents with  . Failure To Thrive  . Nausea  Brief Narrative: 70 year old female with metastatic breast cancer, hypertension anxiety disorder anemia admitted with intractable nausea vomiting and generalized weakness.  She follows with Dr. [name] for chemotherapy and cancer care.  After her recent appointment she went home but unable to get up from chair was weak and brought to the ED, found to have UTI recently as well. Patient was admitted with intermittent nausea vomiting and placed on IV fluids in the ED. She is being followed by PT OT for generalized deconditioning weakness.  Subjective: Seen and examined this morning Alert awake but very weak frail deconditioned. Reports nausea is under control but has poor oral intake has not taken any solid food   Assessment & Plan:  Intractable nausea and vomiting Generalized weakness/deconditioning Dry mouth Dehydration with lactic acidosis from poor po ijntake Multifactorial in setting of metastatic disease, UTI.  KUB right renal stone, nonobstructive bowel gas pattern.  Having some nausea no vomiting.  Advance diet today as tolerated, speech following.  Continue supportive care Protonix IV fluids normal saline at 75 mL, Reglan /compazine/Ativan, IV Decadron as per oncology.  UTI with 20,000 colonies of Enterococcus faecalis Acute urine retention needing Foley Patient on Unasyn will complete treatment with IV x5 days> change to Nitrofurantoin at discharge  Metastatic breast cancer with the spinal mets MRI of cervical thoracic lumbar spine showed diffuse osseous metastatic disease, MRI brain with and without contrast ordered by Dr. [name] and if it shows diffuse metastasis oncology plans to recommend hospice.  She has been dosed #3 of paclitaxel on 04/30/20.  Concern for leptomeningeal carcinomatosis on  MRI C spine, neurology has been consulted who will f/u MRI brain. Await further recommendation. Oncology folllowing.  Anemia of chronic Disease with underlying malignancy monitor H&H transfuse less than 7 g. Recent Labs  Lab 04/30/20 0817 05/02/20 1524 05/03/20 0609 05/04/20 0527  HGB 9.6* 9.3* 7.9* 8.1*  HCT 29.5* 28.8* 24.3* 25.3*   Hypokalemia: 3.4 was repleted. Pancytopenia with leukopenia thrombocytopenia and anemia: monitor. Hypertension blood pressure stable on losartan. Hypoalbuminemia with leg edema.  Monitor.  BNP pending.  Elevate leg.  Augment nutrition.  Diet Order            Diet full liquid Room service appropriate? Yes; Fluid consistency: Thin  Diet effective now                 Nutrition Problem: Moderate Malnutrition Etiology: chronic illness,cancer and cancer related treatments Signs/Symptoms: percent weight loss,energy intake < or equal to 75% for > or equal to 1 month,mild fat depletion,mild muscle depletion Interventions: Boost Breeze,Refer to RD note for recommendations Patient's Body mass index is 26.88 kg/m.  DVT prophylaxis: Place TED hose Start: 05/02/20 1941 Code Status:   Code Status: DNR  Family Communication: plan of care discussed with patient at bedside.  Status is: Inpatient  Remains inpatient appropriate because:IV treatments appropriate due to intensity of illness or inability to take PO and Inpatient level of care appropriate due to severity of illness   Dispo: The patient is from: Home              Anticipated d/c is to: SNF  W/ PALLIATIVE CARE (Vs HOSPICE)              Patient currently is not medically stable to  d/c.   Difficult to place patient No    Unresulted Labs (From admission, onward)         None      Medications reviewed:  Scheduled Meds: . Chlorhexidine Gluconate Cloth  6 each Topical Daily  . dexamethasone  4 mg Intravenous Q12H  . feeding supplement  1 Container Oral TID BM  . losartan  12.5 mg Oral Daily   . ondansetron (ZOFRAN) IV  4 mg Intravenous Q8H  . pantoprazole (PROTONIX) IV  40 mg Intravenous Q24H  . polyethylene glycol  17 g Oral Daily   Continuous Infusions: . sodium chloride 75 mL/hr at 05/06/20 0013    Consultants:see note  Procedures:see note  Antimicrobials: Anti-infectives (From admission, onward)   Start     Dose/Rate Route Frequency Ordered Stop   05/02/20 1800  ampicillin (OMNIPEN) 1 g in sodium chloride 0.9 % 100 mL IVPB  Status:  Discontinued        1 g 300 mL/hr over 20 Minutes Intravenous Every 6 hours 05/02/20 1722 05/06/20 0955     Culture/Microbiology    Component Value Date/Time   SDES  04/30/2020 1300    URINE, CLEAN CATCH Performed at [facility], [place] [address] [place], [place] [address]    SPECREQUEST  04/30/2020 1300    NONE Performed at [facility], [place] [address] [place], [place] [address]    CULT 20,000 COLONIES/mL ENTEROCOCCUS FAECALIS (A) 04/30/2020 1300   REPTSTATUS 05/02/2020 FINAL 04/30/2020 1300    Other culture-see note  Objective: Vitals: Today's Vitals   05/05/20 2017 05/05/20 2115 05/06/20 0532 05/06/20 1100  BP: (!) 154/57  (!) 160/59   Pulse: 94  94   Resp: 16  18   Temp: 97.8 F (36.6 C)  97.7 F (36.5 C)   TempSrc: Oral  Oral   SpO2: 91%  92%   Weight:      Height:      PainSc:  0-No pain  0-No pain    Intake/Output Summary (Last 24 hours) at 05/06/2020 1146 Last data filed at 05/06/2020 0615 Gross per 24 hour  Intake -  Output 1200 ml  Net -1200 ml   Filed Weights   05/02/20 1410  Weight: 82.6 kg   Weight change:   Intake/Output from previous day: 03/08 0701 - 03/09 0700 In: -  Out: 1200 [Urine:1200] Intake/Output this shift: No intake/output data recorded. Filed Weights   05/02/20 1410  Weight: 82.6 kg    Examination: General exam: AAO, old for age, ill and frail looking,NAD, weak appearing. HEENT:Oral mucosa moist, Ear/Nose WNL  grossly,dentition normal. Respiratory system: bilaterally diminished,no use of accessory muscle, non tender. Cardiovascular system: S1 & S2 +, regular, No JVD. Gastrointestinal system: Abdomen soft, NT,ND, BS+. Nervous System:Alert, awake, moving extremities and grossly nonfocal Extremities: Bilateral ankle edema 2+, distal peripheral pulses palpable.  Skin: No rashes,no icterus. MSK: Normal muscle bulk,tone, power  Data Reviewed: I have personally reviewed following labs and imaging studies CBC: Recent Labs  Lab 04/30/20 0817 05/02/20 1524 05/03/20 0609 05/04/20 0527  WBC 6.0 4.6 3.8* 3.7*  NEUTROABS 4.6  --   --   --   HGB 9.6* 9.3* 7.9* 8.1*  HCT 29.5* 28.8* 24.3* 25.3*  MCV 88.1 90.0 90.3 92.0  PLT 144* 129* 91* 94*   Basic Metabolic Panel: Recent Labs  Lab 04/30/20 0817 05/02/20 1524 05/03/20 0609 05/04/20 0527 05/05/20 0459  NA 138 139 140 140 138  K 3.3*  2.9* 3.5 3.4* 3.4*  CL 103 103 106 105 105  CO2 19* 20* 22 20* 20*  GLUCOSE 102* 103* 98 97 98  BUN 10 13 11 10 10   CREATININE 0.72 0.78 0.67 0.63 0.54  CALCIUM 8.1* 7.9* 7.5* 7.6* 7.9*  MG  --  2.1  --   --   --   PHOS  --  3.0  --   --   --    GFR: Estimated Creatinine Clearance: 76.3 mL/min (by C-G formula based on SCr of 0.54 mg/dL). Liver Function Tests: Recent Labs  Lab 04/30/20 0817 05/02/20 1524 05/03/20 0609 05/04/20 0527  AST 101* 129* 107* 103*  ALT 36 43 37 39  ALKPHOS 205* 194* 148* 169*  BILITOT 1.7* 2.6* 2.5*  2.4* 1.9*  PROT 5.1* 5.4* 4.6* 4.5*  ALBUMIN 2.9* 3.1* 2.6* 2.6*   Recent Labs  Lab 05/02/20 1524  LIPASE 69*   No results for input(s): AMMONIA in the last 168 hours. Coagulation Profile: No results for input(s): INR, PROTIME in the last 168 hours. Cardiac Enzymes: No results for input(s): CKTOTAL, CKMB, CKMBINDEX, TROPONINI in the last 168 hours. BNP (last 3 results) No results for input(s): PROBNP in the last 8760 hours. HbA1C: No results for input(s): HGBA1C in  the last 72 hours. CBG: No results for input(s): GLUCAP in the last 168 hours. Lipid Profile: No results for input(s): CHOL, HDL, LDLCALC, TRIG, CHOLHDL, LDLDIRECT in the last 72 hours. Thyroid Function Tests: No results for input(s): TSH, T4TOTAL, FREET4, T3FREE, THYROIDAB in the last 72 hours. Anemia Panel: No results for input(s): VITAMINB12, FOLATE, FERRITIN, TIBC, IRON, RETICCTPCT in the last 72 hours. Sepsis Labs: Recent Labs  Lab 05/02/20 1524 05/03/20 1400  LATICACIDVEN 4.2* 2.9*    Recent Results (from the past 240 hour(s))  Culture, Urine     Status: Abnormal   Collection Time: 04/30/20  1:00 PM   Specimen: Urine, Clean Catch  Result Value Ref Range Status   Specimen Description   Final    URINE, CLEAN CATCH Performed at [facility], [place] [address], [place], [place] [address]    Special Requests   Final    NONE Performed at [facility], [place] [address] [place], [place] [address]    Culture 20,000 COLONIES/mL ENTEROCOCCUS FAECALIS (A)  Final   Report Status 05/02/2020 FINAL  Final   Organism ID, Bacteria ENTEROCOCCUS FAECALIS (A)  Final      Susceptibility   Enterococcus faecalis - MIC*    AMPICILLIN <=2 SENSITIVE Sensitive     NITROFURANTOIN <=16 SENSITIVE Sensitive     VANCOMYCIN 2 SENSITIVE Sensitive     * 20,000 COLONIES/mL ENTEROCOCCUS FAECALIS  SARS CORONAVIRUS 2 (TAT 6-24 HRS) Nasopharyngeal Nasopharyngeal Swab     Status: None   Collection Time: 05/02/20  7:15 PM   Specimen: Nasopharyngeal Swab  Result Value Ref Range Status   SARS Coronavirus 2 NEGATIVE NEGATIVE Final    Comment: (NOTE) SARS-CoV-2 target nucleic acids are NOT DETECTED.  The SARS-CoV-2 RNA is generally detectable in upper and lower respiratory specimens during the acute phase of infection. Negative results do not preclude SARS-CoV-2 infection, do not rule out co-infections with other pathogens, and should not be used as the sole  basis for treatment or other patient management decisions. Negative results must be combined with clinical observations, patient history, and epidemiological information. The expected result is Negative.  Fact Sheet for Patients: SugarRoll.be  Fact Sheet for Healthcare Providers:  https://www.woods-mathews.com/  This test is not yet approved or cleared by the [place] and  has been authorized for detection and/or diagnosis of SARS-CoV-2 by FDA under an Emergency Use Authorization (EUA). This EUA will remain  in effect (meaning this test can be used) for the duration of the COVID-19 declaration under Se ction 564(b)(1) of the Act, 21 U.S.C. section 360bbb-3(b)(1), unless the authorization is terminated or revoked sooner.  Performed at [facility], [place] [address], [place], [place] [address]      Radiology Studies: MR CERVICAL SPINE W WO CONTRAST  Result Date: 05/04/2020 CLINICAL DATA:  Breast carcinoma. Difficulty swallowing and lower extremity weakness. EXAM: MRI TOTAL SPINE WITHOUT AND WITH CONTRAST TECHNIQUE: Multisequence MR imaging of the spine from the cervical spine to the sacrum was performed prior to and following IV contrast administration for evaluation of spinal metastatic disease. CONTRAST:  2mL GADAVIST GADOBUTROL 1 MMOL/ML IV SOLN COMPARISON:  None. FINDINGS: MRI CERVICAL SPINE FINDINGS Alignment: Physiologic. Vertebrae: There is diffuse abnormal signal throughout the cervical vertebral column. Lesions involve the vertebral bodies and the spinous processes. There is no compression fracture. Cord: No abnormal enhancement in the spinal cord. No epidural enhancement. Posterior Fossa, vertebral arteries, paraspinal tissues: Incompletely visualized dural-based mass in the left posterior fossa, unchanged. Disc levels: Degenerative disc disease greatest at C5-6 and C6-7 without spinal canal stenosis. MRI THORACIC SPINE FINDINGS  Alignment:  Physiologic. Vertebrae: Diffusely abnormal signal within the thoracic vertebral column with greatest lesion at T9. No compression fracture. Cord: There are 2 small foci of abnormal contrast enhancement along the left dorsal aspect of the distal spinal cord at the T12 level. No epidural enhancement. Paraspinal and other soft tissues: Negative Disc levels: No spinal canal stenosis MRI LUMBAR SPINE FINDINGS Segmentation:  Normal Alignment:  Normal Vertebrae: Diffusely abnormal bone marrow signal. No compression fracture. Conus medullaris: Extends to the L1 level and appears normal aside from the lesions at the T12 level described above. Paraspinal and other soft tissues: Negative Disc levels: No spinal canal stenosis. IMPRESSION: 1. Diffuse osseous metastatic disease without pathologic fracture. 2. 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 3. No spinal canal stenosis. Electronically Signed   By: Ulyses Jarred M.D.   On: 05/04/2020 22:58   MR THORACIC SPINE W WO CONTRAST  Result Date: 05/04/2020 CLINICAL DATA:  Breast carcinoma. Difficulty swallowing and lower extremity weakness. EXAM: MRI TOTAL SPINE WITHOUT AND WITH CONTRAST TECHNIQUE: Multisequence MR imaging of the spine from the cervical spine to the sacrum was performed prior to and following IV contrast administration for evaluation of spinal metastatic disease. CONTRAST:  38mL GADAVIST GADOBUTROL 1 MMOL/ML IV SOLN COMPARISON:  None. FINDINGS: MRI CERVICAL SPINE FINDINGS Alignment: Physiologic. Vertebrae: There is diffuse abnormal signal throughout the cervical vertebral column. Lesions involve the vertebral bodies and the spinous processes. There is no compression fracture. Cord: No abnormal enhancement in the spinal cord. No epidural enhancement. Posterior Fossa, vertebral arteries, paraspinal tissues: Incompletely visualized  dural-based mass in the left posterior fossa, unchanged. Disc levels: Degenerative disc disease greatest at C5-6 and C6-7 without spinal canal stenosis. MRI THORACIC SPINE FINDINGS Alignment:  Physiologic. Vertebrae: Diffusely abnormal signal within the thoracic vertebral column with greatest lesion at T9. No compression fracture. Cord: There are 2 small foci of abnormal contrast enhancement along the left dorsal aspect of the distal spinal cord at the T12  level. No epidural enhancement. Paraspinal and other soft tissues: Negative Disc levels: No spinal canal stenosis MRI LUMBAR SPINE FINDINGS Segmentation:  Normal Alignment:  Normal Vertebrae: Diffusely abnormal bone marrow signal. No compression fracture. Conus medullaris: Extends to the L1 level and appears normal aside from the lesions at the T12 level described above. Paraspinal and other soft tissues: Negative Disc levels: No spinal canal stenosis. IMPRESSION: 1. Diffuse osseous metastatic disease without pathologic fracture. 2. 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 3. No spinal canal stenosis. Electronically Signed   By: Ulyses Jarred M.D.   On: 05/04/2020 22:58   MR Lumbar Spine W Wo Contrast  Result Date: 05/04/2020 CLINICAL DATA:  Breast carcinoma. Difficulty swallowing and lower extremity weakness. EXAM: MRI TOTAL SPINE WITHOUT AND WITH CONTRAST TECHNIQUE: Multisequence MR imaging of the spine from the cervical spine to the sacrum was performed prior to and following IV contrast administration for evaluation of spinal metastatic disease. CONTRAST:  25mL GADAVIST GADOBUTROL 1 MMOL/ML IV SOLN COMPARISON:  None. FINDINGS: MRI CERVICAL SPINE FINDINGS Alignment: Physiologic. Vertebrae: There is diffuse abnormal signal throughout the cervical vertebral column. Lesions involve the vertebral bodies and the spinous processes. There is no  compression fracture. Cord: No abnormal enhancement in the spinal cord. No epidural enhancement. Posterior Fossa, vertebral arteries, paraspinal tissues: Incompletely visualized dural-based mass in the left posterior fossa, unchanged. Disc levels: Degenerative disc disease greatest at C5-6 and C6-7 without spinal canal stenosis. MRI THORACIC SPINE FINDINGS Alignment:  Physiologic. Vertebrae: Diffusely abnormal signal within the thoracic vertebral column with greatest lesion at T9. No compression fracture. Cord: There are 2 small foci of abnormal contrast enhancement along the left dorsal aspect of the distal spinal cord at the T12 level. No epidural enhancement. Paraspinal and other soft tissues: Negative Disc levels: No spinal canal stenosis MRI LUMBAR SPINE FINDINGS Segmentation:  Normal Alignment:  Normal Vertebrae: Diffusely abnormal bone marrow signal. No compression fracture. Conus medullaris: Extends to the L1 level and appears normal aside from the lesions at the T12 level described above. Paraspinal and other soft tissues: Negative Disc levels: No spinal canal stenosis. IMPRESSION: 1. Diffuse osseous metastatic disease without pathologic fracture. 2. 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 3. No spinal canal stenosis. Electronically Signed   By: Ulyses Jarred M.D.   On: 05/04/2020 22:58     LOS: 3 days   Antonieta Pert, MD [facility]  05/06/2020, 11:46 AM

## 2020-05-06 NOTE — Progress Notes (Signed)
Daily Progress Note   Patient Name: Brooke [name]       Date: 05/06/2020 DOB: Sep 25, 1950  Age: 70 y.o. MRN#: [id number] Attending Physician: Antonieta Pert, MD Primary Care Physician: [name], MD Admit Date: 05/02/2020  Reason for Consultation/Follow-up: Establishing goals of care  Subjective: Case manager for meeting Brooke [name] was hoping I would to be able to stop and check in on her today.  I saw and examined Brooke [name].  She was lying in bed in no distress.  She is awake and alert and interactive today.  She tells me that she still feels weak and tired overall.  She is by her lack of progress.  We discussed her continued clinical course and changes in her nutrition and functional status.  We also discussed plan for MRI and that Dr. [name] is hoping this will give [place] more information to make medical decision about next steps moving forward.  She is hopeful to be able to receive more disease modifying therapy.  At the same time, we discussed that, if she is, in fact, at the point where further disease modifying therapy is not likely to add time or quality to her life, that does not mean that there is not care like to give, however it means that we need to focus on interventions that are likely to help improve her quality of life is much as possible through aggressive symptom management.  I briefly discussed how hospice can help in this situation.  She is open to conversation but states that she feels she is, "not quite to that point yet."  She expressed understanding this.  I also talked with her about her friend, Lajoyce Lauber, calling yesterday.  She does confirm that Ms. Lilia Pro should be added to her contact list to be able to receive information about her current status.  She  reports that Ms. Lilia Pro is is the closest person she has to a caregiver for her in the community.  Length of Stay: 3  Current Medications: Scheduled Meds:   Chlorhexidine Gluconate Cloth  6 each Topical Daily   dexamethasone  4 mg Intravenous Q12H   feeding supplement  1 Container Oral TID BM   losartan  12.5 mg Oral Daily   nitrofurantoin (macrocrystal-monohydrate)  100 mg Oral Q12H   ondansetron (ZOFRAN) IV  4 mg Intravenous Q8H   pantoprazole (PROTONIX) IV  40 mg Intravenous Q24H   polyethylene glycol  17 g Oral Daily    Continuous Infusions:  sodium chloride 30 mL/hr at 05/06/20 1149    PRN Meds: acetaminophen **OR** acetaminophen, hydrALAZINE, LORazepam, metoCLOPramide (REGLAN) injection, prochlorperazine  Physical Exam         General: Awake, alert, chronically ill-appearing HEENT: No bruits, no goiter, no JVD Lungs: Good air movement, clear Abdomen: Soft, nondistended, positive bowel sounds.  Ext: Significant edema Skin: Warm and dry  Vital Signs: BP 138/62 (BP Location: Right Arm)    Pulse 91    Temp 100.3 F (37.9 C) (Oral)    Resp 17    Ht 5\' 9"  (1.753 m)    Wt 82.6 kg    LMP 03/18/1997    SpO2 94%    BMI 26.88 kg/m  SpO2: SpO2: 94 % O2 Device: O2 Device: Room Air O2 Flow Rate:    Intake/output summary:   Intake/Output Summary (Last 24 hours) at 05/06/2020 1852 Last data filed at 05/06/2020 1300 Gross per 24 hour  Intake 0 ml  Output 400 ml  Net -400 ml   LBM: Last BM Date:  (PTA per pt) Baseline Weight: Weight: 82.6 kg Most recent weight: Weight: 82.6 kg       Palliative Assessment/Data:      Patient Active Problem List   Diagnosis Date Noted   Malnutrition of moderate degree 05/05/2020   Intractable vomiting with nausea 05/03/2020   Acute lower UTI 04/21/2020   Dehydration 04/21/2020   Depression with anxiety 04/21/2020   Transaminitis 04/21/2020   Metastatic malignant neoplasm (HCC)    Nausea vomiting and diarrhea     Intractable nausea and vomiting 04/20/2020   PICC (peripherally inserted central catheter) in place 04/17/2020   Bone metastases ([place]) 03/11/2020   Liver metastases ([place]) 03/05/2020   Lung metastases ([place]) 03/05/2020   Postoperative state 04/23/2019   Superficial basal cell carcinoma (BCC) 02/10/2019   Goals of care, counseling/discussion 12/14/2017   Family history of uterine cancer    Family history of prostate cancer    Melanoma in situ of right upper extremity ([place]) 07/18/2017   Port-A-Cath in place 04/18/2017   Drug-induced neutropenia ([place]) 03/09/2017   Family history of breast cancer 02/02/2017   Family history of colon cancer 02/02/2017   Family history of prostate cancer in father 02/02/2017   Malignant neoplasm of overlapping sites of left breast in female, estrogen receptor positive ([place]) 01/31/2017   Hx of hot flashes, menopausal, HRT in the past 12/19/2011   Osteopenia, stable, DEXA 2013, recs to repeat in 2018 12/19/2011   Hx of Hyperlipidemia, LDL 144 in 07/2011 12/19/2011   Vitamin D insufficiency 12/19/2011   Fibroid     Palliative Care Assessment & Plan   Patient Profile: 70 y/o female with metastatic breast cancer followed by Dr. [name] who presented with n/v and generalized weakness. She was admitted a couple of weeks ago with similar presentation. Work-up consistent with UTI for which she is on appropriate antibiotics. Palliative care followed her during previous hospitalization and was re-consulted this admission for ongoing goal of care discussion and symptom management.   Recommendations/Plan: DNR/DNI Await results of MRI as this will drive further decision making moving forward about her care plan. With her permission, I called and updated her friend, Lajoyce Lauber.  Goals of Care and Additional Recommendations: Limitations on Scope of Treatment: Full Scope Treatment  Code Status:  Code Status Orders  (From admission, onward)          Start     Ordered   05/02/20 1941  Do not attempt resuscitation (DNR)  Continuous       Question Answer Comment  In the event of cardiac or respiratory ARREST Do not call a code blue   In the event of cardiac or respiratory ARREST Do not perform Intubation, CPR, defibrillation or ACLS   In the event of cardiac or respiratory ARREST Use medication by any route, position, wound care, and other measures to relive pain and suffering. May use oxygen, suction and manual treatment of airway obstruction as needed for comfort.   Comments Confirmed with patient      05/02/20 1940        Code Status History    Date Active Date Inactive Code Status Order ID Comments User Context   04/22/2020 1030 04/24/2020 2028 DNR [id number]  Micheline Rough, MD Inpatient   04/20/2020 1807 04/22/2020 1029 Full Code [id number]  Jonnie Finner, DO ED   04/23/2019 1126 04/24/2019 1408 Full Code [id number]  [name], MD Inpatient   04/23/2019 0751 04/23/2019 1126 Full Code [id number]  [name], MD Inpatient   09/25/2017 1520 09/26/2017 1836 Full Code [id number]  Jovita Kussmaul, MD Inpatient   03/02/2017 1542 03/03/2017 1242 Full Code [id number]  Jovita Kussmaul, MD Inpatient   Advance Care Planning Activity      Prognosis:  Unable to determine  Discharge Planning: To Be Determined  Care plan was discussed with patient  Thank you for allowing the Palliative Medicine Team to assist in the care of this patient.   Time In: 1615 Time Out: 1700 Total Time 45 Prolonged Time Billed No   Greater than 50%  of this time was spent counseling and coordinating care related to the above assessment and plan.  Micheline Rough, MD  Please contact Palliative Medicine Team phone at [phone] for questions and concerns.

## 2020-05-06 NOTE — Progress Notes (Signed)
Physical Therapy Treatment Patient Details Name: Brooke [name] MRN: [id number] DOB: January 16, 1951 Today's Date: 05/06/2020    History of Present Illness 70 year old female with history of metastatic breast cancer, hypertension, anxiety presented with intractable nausea vomiting and generalized weakness. Dx of UTI.    PT Comments    Pt in bed very edematous throughout.  General Comments: AxO x 3 with intermittent odd responses.  Flat.  Appears withdrawn/uninterested for most part.  Did agree to some excersises then open up more to conversation. Performed AAROM B LE then assisted with sitting EOB.  Pt required increased assist this session.  Pt unable to offer much self assist due to weakness and swelling/heaviness throughout.  Once positioned upright, pt was able to static sit EOB x 7 min barely able to lift arms.  Mild c/o dizziness.  Unable to attempt any OOB actiivty due to profound weakness.  Assisted back to bed required + 2 Total Assist.  Positioned to comfort.  Float B heels and pillow to position to prevent B foot drop.     Follow Up Recommendations  SNF     Equipment Recommendations  Wheelchair cushion (measurements PT);Wheelchair (measurements PT)    Recommendations for Other Services       Precautions / Restrictions Precautions Precautions: Fall Precaution Comments: SIRS    Mobility  Bed Mobility Overal bed mobility: Needs Assistance Bed Mobility: Supine to Sit;Sit to Sidelying     Supine to sit: Max assist Sit to supine: +2 for physical assistance;+2 for safety/equipment;Total assist   General bed mobility comments: required increased assist this session.   pt sat EOB x 7 min before c/o fatigue.    Transfers                 General transfer comment: pt declined to attempt  Ambulation/Gait                 Stairs             Wheelchair Mobility    Modified Rankin (Stroke Patients Only)       Balance                                             Cognition Arousal/Alertness: Awake/alert Behavior During Therapy: WFL for tasks assessed/performed;Flat affect Overall Cognitive Status: Within Functional Limits for tasks assessed                                 General Comments: AxO x 3 with intermittent odd responses.  Flat.  Appears withdrawn/uninterested for most part.  Did agree to some excersises then open up more to conversation.      Exercises      General Comments        Pertinent Vitals/Pain Pain Assessment: No/denies pain    Home Living                      Prior Function            PT Goals (current goals can now be found in the care plan section) Progress towards PT goals: Progressing toward goals    Frequency    Min 3X/week      PT Plan Current plan remains appropriate    Co-evaluation  AM-PAC PT "6 Clicks" Mobility   Outcome Measure  Help needed turning from your back to your side while in a flat bed without using bedrails?: Total Help needed moving from lying on your back to sitting on the side of a flat bed without using bedrails?: Total Help needed moving to and from a bed to a chair (including a wheelchair)?: Total Help needed standing up from a chair using your arms (e.g., wheelchair or bedside chair)?: Total Help needed to walk in hospital room?: Total Help needed climbing 3-5 steps with a railing? : Total 6 Click Score: 6    End of Session Equipment Utilized During Treatment: Gait belt Activity Tolerance: Patient limited by fatigue Patient left: in bed;with call bell/phone within reach;with bed alarm set Nurse Communication: Mobility status PT Visit Diagnosis: Difficulty in walking, not elsewhere classified (R26.2);Muscle weakness (generalized) (M62.81)     Time: 9432-0037 PT Time Calculation (min) (ACUTE ONLY): 24 min  Charges:  $Therapeutic Exercise: 8-22 mins $Therapeutic Activity: 8-22 mins                      Rica Koyanagi  PTA Acute  Rehabilitation Services Pager      [phone] Office      [phone]

## 2020-05-07 ENCOUNTER — Telehealth: Payer: Self-pay

## 2020-05-07 ENCOUNTER — Inpatient Hospital Stay: Payer: Medicare PPO

## 2020-05-07 ENCOUNTER — Inpatient Hospital Stay: Payer: Medicare PPO | Admitting: Oncology

## 2020-05-07 ENCOUNTER — Other Ambulatory Visit: Payer: Self-pay | Admitting: Oncology

## 2020-05-07 ENCOUNTER — Inpatient Hospital Stay (HOSPITAL_COMMUNITY): Payer: Medicare PPO

## 2020-05-07 DIAGNOSIS — I509 Heart failure, unspecified: Secondary | ICD-10-CM

## 2020-05-07 DIAGNOSIS — E876 Hypokalemia: Secondary | ICD-10-CM | POA: Diagnosis not present

## 2020-05-07 DIAGNOSIS — C50812 Malignant neoplasm of overlapping sites of left female breast: Secondary | ICD-10-CM | POA: Diagnosis not present

## 2020-05-07 DIAGNOSIS — Z7189 Other specified counseling: Secondary | ICD-10-CM | POA: Diagnosis not present

## 2020-05-07 DIAGNOSIS — R112 Nausea with vomiting, unspecified: Secondary | ICD-10-CM | POA: Diagnosis not present

## 2020-05-07 DIAGNOSIS — C7951 Secondary malignant neoplasm of bone: Secondary | ICD-10-CM | POA: Diagnosis not present

## 2020-05-07 LAB — BASIC METABOLIC PANEL
Anion gap: 13 (ref 5–15)
BUN: 11 mg/dL (ref 8–23)
CO2: 20 mmol/L — ABNORMAL LOW (ref 22–32)
Calcium: 8.7 mg/dL — ABNORMAL LOW (ref 8.9–10.3)
Chloride: 105 mmol/L (ref 98–111)
Creatinine, Ser: 0.67 mg/dL (ref 0.44–1.00)
GFR, Estimated: >60 mL/min (ref >60)
Glucose, Bld: 105 mg/dL — ABNORMAL HIGH (ref 70–99)
Potassium: 3.9 mmol/L (ref 3.5–5.1)
Sodium: 138 mmol/L (ref 135–145)

## 2020-05-07 LAB — ECHOCARDIOGRAM COMPLETE
AR max vel: 2.45 cm2
AV Peak grad: 10.8 mmHg
Ao pk vel: 1.64 m/s
Area-P 1/2: 5.38 cm2
Height: 69 in
S' Lateral: 2.6 cm
Weight: 2912 oz

## 2020-05-07 LAB — CBC
HCT: 26.7 % — ABNORMAL LOW (ref 36.0–46.0)
Hemoglobin: 8.5 g/dL — ABNORMAL LOW (ref 12.0–15.0)
MCH: 29 pg (ref 26.0–34.0)
MCHC: 31.8 g/dL (ref 30.0–36.0)
MCV: 91.1 fL (ref 80.0–100.0)
Platelets: 125 10*3/uL — ABNORMAL LOW (ref 150–400)
RBC: 2.93 MIL/uL — ABNORMAL LOW (ref 3.87–5.11)
RDW: 24.1 % — ABNORMAL HIGH (ref 11.5–15.5)
WBC: 1.6 10*3/uL — ABNORMAL LOW (ref 4.0–10.5)
nRBC: 1.3 % — ABNORMAL HIGH (ref 0.0–0.2)

## 2020-05-07 LAB — BRAIN NATRIURETIC PEPTIDE: B Natriuretic Peptide: 90.7 pg/mL (ref 0.0–100.0)

## 2020-05-07 MED ORDER — PALBOCICLIB 125 MG PO CAPS: 125.0000 mg | ORAL_CAPSULE | Freq: Every day | ORAL | 6 refills | Status: DC

## 2020-05-07 MED ORDER — ANASTROZOLE 1 MG PO TABS
1.0000 mg | ORAL_TABLET | Freq: Every day | ORAL | Status: DC
  Administered 2020-05-07 – 2020-05-12 (×5): 1 mg via ORAL
  Filled 2020-05-07 (×6): qty 1

## 2020-05-07 MED ORDER — FUROSEMIDE 10 MG/ML IJ SOLN
20.0000 mg | Freq: Two times a day (BID) | INTRAMUSCULAR | Status: DC
  Administered 2020-05-07 – 2020-05-09 (×5): 20 mg via INTRAVENOUS
  Filled 2020-05-07 (×5): qty 2

## 2020-05-07 MED ORDER — ZINC OXIDE 12.8 % EX OINT
TOPICAL_OINTMENT | CUTANEOUS | Status: DC | PRN
  Administered 2020-05-07 – 2020-05-11 (×2): 1 via TOPICAL
  Filled 2020-05-07 (×2): qty 56.7

## 2020-05-07 NOTE — Progress Notes (Signed)
Echocardiogram 2D Echocardiogram has been performed.  Brooke [name] 05/07/2020, 3:22 PM

## 2020-05-07 NOTE — Progress Notes (Signed)
MRI brain with and without contrast: 1. Unchanged right-greater-than-left dural thickening, which is greatest at the anterior right convexity. 2. Unchanged inter hemispheric extra-axial mass, likely metastatic disease. 3. Unchanged 7 mm extra-axial lesion of the left posterior fossa, which may be a meningioma or a focus of metastatic disease.  MRI of cervical, thoracic and lumbar spine with and without contrast: 1. Diffuse osseous metastatic disease without pathologic fracture. 2. 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 3. No spinal canal stenosis.  A/R: 70 year old female with metastatic breast cancer and diffuse weakness - MRI brain does not show evidence for leptomeningeal carcinomatosis. There is dural involvement focally as well as thickening, most likely secondary to metastatic breast cancer, which is stable since the prior MRI.  - Regarding the patient's presenting complaint of diffuse weakness, most likely her UTI is a significant contributing factor.  - Neurology will see the patient this evening after Oncology input.   Electronically signed: Dr. Kerney Elbe

## 2020-05-07 NOTE — Progress Notes (Signed)
   05/07/20 0615  Vitals  BP (!) 163/64 (NP aware)  MAP (mmHg) 91  BP Method Automatic  Pulse Rate 91  MEWS COLOR  MEWS Score Color Green  MEWS Score  MEWS Temp 0  MEWS Systolic 0  MEWS Pulse 0  MEWS RR 0  MEWS LOC 0  MEWS Score 0   Randol Kern, NP aware of BP

## 2020-05-07 NOTE — Telephone Encounter (Signed)
Oral Oncology Patient Advocate Encounter  Received notification from [facility] that prior authorization for Brooke [name] is required.  PA submitted on CoverMyMeds Key [id number] Status is pending  Oral Oncology Clinic will continue to follow.  [place] Patient [place] Phone [phone] Fax [phone] 05/07/2020 9:34 AM

## 2020-05-07 NOTE — TOC Progression Note (Signed)
Transition of Care [facility]) - Progression Note    Patient Details  Name: Brooke [name] MRN: [id number] Date of Birth: [birth year]  Transition of Care [facility]) CM/SW Contact  Lennex Pietila, Marjie Skiff, RN Phone Number: 05/07/2020, 3:29 PM  Clinical Narrative:    Spoke with pt again at bedside for SNF bed choice. Pt friend Leda Gauze also in the room and helping pt make SNF decision. [facility] chosen. Will alert liaison of bed acceptance and alert [facility] of facility choice.  [name] has approved pt for SNF. Ref # [id number].   Expected Discharge Plan: Skilled Nursing Facility Barriers to Discharge: Continued Medical Work up  Expected Discharge Plan and Services Expected Discharge Plan: [place]       Social Determinants of Health (SDOH) Interventions    Readmission Risk Interventions Readmission Risk Prevention Plan 05/04/2020  Transportation Screening Complete  PCP or Specialist Appt within 3-5 Days Complete  HRI or [place] Complete  Social Work Consult for [place] Planning/Counseling Complete  Palliative Care Screening Complete  Medication Review [employer]) Complete  Some recent data might be hidden

## 2020-05-07 NOTE — Consult Note (Signed)
WOC Nurse Consult Note: Reason for Consult:partial thickness tissue loss to bilateral gluteal folds consistent with breakdown from incontinence associated dermatitis. Patient has increasing weakness and had a fall at home. Noted malnutrition Completing ADLs has become increasingly difficult.  Currently being treated for UTI and having urinary retention.  Foley catheter is in place.  Weakness and limited bed mobility noted as well. Will recommend low air loss mattress to redistribute pressure.   No disposable briefs or underpads.  Wound type:Moisture associated skin damage.   Pressure Injury POA: NA Measurement: Left gluteal: 2 cmx 1 cm x 0.1 cm  Right gluteal:  1 cm x 1.5 cm with thin fibrin noted.   Wound bed: pink (left) thin fibrin (right)  Drainage (amount, consistency, odor) None noted.  Periwound: skin frequently moist  This is resolved with catheter now.  Dressing procedure/placement/frequency: Cleanse buttocks with Soap and water and pat dry. Apply Zinc ointment twice daily and PRN soilage. No disposable briefs.  Dermatherapy linen will wick moisture and improve skin microclimate.  Will not follow at this time.  Please re-consult if needed.  Domenic Moras MSN, RN, FNP-BC CWON Wound, Ostomy, Continence Nurse Pager [phone]

## 2020-05-07 NOTE — Progress Notes (Signed)
PROGRESS NOTE    Brooke [name]  NIO:[id number] DOB: [birth year] DOA: 05/02/2020 PCP: [name], MD   Chief Complaint  Patient presents with  . Failure To Thrive  . Nausea  Brief Narrative: 70 year old female with metastatic breast cancer, hypertension anxiety disorder anemia admitted with intractable nausea vomiting and generalized weakness.  She follows with Dr. [name] for chemotherapy and cancer care.  After her recent appointment she went home but unable to get up from chair was weak and brought to the ED, found to have UTI recently as well.Patient was admitted with intermittent nausea vomiting and placed on IV fluids in the ED.She is being followed by PT OT for generalized deconditioning weakness.  Subjective: Seen and examined this morning.  Patient reports generalized weakness able to move legs arm still has edematous leg. Headache nausea vomiting fever or chills. Dr. Ramon Dredge from neurology called me he has reviewed MRI brain and will have formal consult later today but do not anticipate any acute intervention or LP at this time.  Assessment & Plan:  Intractable nausea and vomiting Generalized weakness/deconditioning Dry mouth Dehydration with lactic acidosis from poor po ijntake Multifactorial in setting of metastatic disease, UTI, dural involvement of cancer-stable per MRI- neuro reviewed 3/10- official consult to follow, await oncology eval pending for today. Tolerating po. No obstructive bowel gas pattern I nxray. Cont SLP, Diet as tolerated,Protonixm IVF-NSS 75 mL, Reglan /compazine/Ativan,  And IV Decadron.  Does have significant leg edema likely contributed to her deconditioning weakness we will try to diurese gently with IV Lasix.  Lower extremity edema b/l gently diurese with IV Lasix, check echocardiogram.  UTI with 20,000 colonies of Enterococcus faecalis Acute urine retention needing Foley Patient on Unasyn-s/px5 days>changed to Nitrofurantoin  3/9  Metastatic breast cancer with the spinal mets MRI of cervical thoracic lumbar spine showed diffuse osseous metastatic disease, MRI brain with and without dural involvement/thickening- Dr. [name]  To see today for recs- ?? Hospice vs ongoing treatment, palliative following. She has had #3 of paclitaxel on 04/30/20.  Concern for leptomeningeal carcinomatosis on MRI C spine, MRI brain shows unchanged right greater than left dural thickening, unchanged interhemispheric extra-axial mass likely metastatic disease, unchanged 7 mm extra-axial lesion of the left posterior fossa which may meningioma or focal metastatic disease Dr Anselm Pancoast reviewed this am 3/10 and I discussed-official consult to follow but no acute intervention or LP planned-await oncology eval pending for today.   Pancytopenia with leukopenia thrombocytopenia and anemia: wbc 1600, plt 125k and hb 8.5 gm. Monitor. Suspect from her chemo.  Anemia of chronic Disease with underlying malignancy monitor H&H transfuse less than 7 g. Recent Labs  Lab 05/02/20 1524 05/03/20 0609 05/04/20 0527 05/07/20 0539  HGB 9.3* 7.9* 8.1* 8.5*  HCT 28.8* 24.3* 25.3* 26.7*   Hypokalemia: resolved  Hypertension stable on losartan. Hypoalbuminemia with leg edema.Elevate leg.Augment nutrition.  Diet Order            Diet full liquid Room service appropriate? Yes; Fluid consistency: Thin  Diet effective now                 Nutrition Problem: Moderate Malnutrition Etiology: chronic illness,cancer and cancer related treatments Signs/Symptoms: percent weight loss,energy intake < or equal to 75% for > or equal to 1 month,mild fat depletion,mild muscle depletion Interventions: Boost Breeze,Refer to RD note for recommendations Patient's Body mass index is 26.88 kg/m.  DVT prophylaxis: Place TED hose Start: 05/02/20 1941 Code Status:   Code Status: DNR  Family Communication: plan of care discussed with patient at bedside.  Status is:  Inpatient  Remains inpatient appropriate because:IV treatments appropriate due to intensity of illness or inability to take PO and Inpatient level of care appropriate due to severity of illness  Dispo: The patient is from: Home              Anticipated d/c is to: SNF  W/ PALLIATIVE CARE (Vs HOSPICE)              Patient currently is not medically stable to d/c.   Difficult to place patient No    Unresulted Labs (From admission, onward)          Start     Ordered   05/07/20 0500  CBC  Daily,   R      05/06/20 1147   05/07/20 0500  Brain natriuretic peptide  Tomorrow morning,   R        05/06/20 1147          Medications reviewed:  Scheduled Meds: . Chlorhexidine Gluconate Cloth  6 each Topical Daily  . dexamethasone  4 mg Intravenous Q12H  . feeding supplement  1 Container Oral TID BM  . losartan  12.5 mg Oral Daily  . nitrofurantoin (macrocrystal-monohydrate)  100 mg Oral Q12H  . ondansetron (ZOFRAN) IV  4 mg Intravenous Q8H  . pantoprazole (PROTONIX) IV  40 mg Intravenous Q24H  . polyethylene glycol  17 g Oral Daily   Continuous Infusions: . sodium chloride 30 mL/hr at 05/06/20 1149    Consultants:see note  Procedures:see note  Antimicrobials: Anti-infectives (From admission, onward)   Start     Dose/Rate Route Frequency Ordered Stop   05/06/20 1300  nitrofurantoin (macrocrystal-monohydrate) (MACROBID) capsule 100 mg        100 mg Oral Every 12 hours 05/06/20 1209 05/09/20 0959   05/02/20 1800  ampicillin (OMNIPEN) 1 g in sodium chloride 0.9 % 100 mL IVPB  Status:  Discontinued        1 g 300 mL/hr over 20 Minutes Intravenous Every 6 hours 05/02/20 1722 05/06/20 0955     Culture/Microbiology    Component Value Date/Time   SDES  04/30/2020 1300    URINE, CLEAN CATCH Performed at [facility], [place] [address] [place], [place] [address]    SPECREQUEST  04/30/2020 1300    NONE Performed at [facility], [place]  [address] [place], [place] [address]    CULT 20,000 COLONIES/mL ENTEROCOCCUS FAECALIS (A) 04/30/2020 1300   REPTSTATUS 05/02/2020 FINAL 04/30/2020 1300    Other culture-see note  Objective: Vitals: Today's Vitals   05/06/20 2010 05/06/20 2247 05/07/20 0454 05/07/20 0615  BP: (!) 149/66  (!) 169/69 (!) 163/64  Pulse: 91  91 91  Resp: 19  18   Temp: 98.2 F (36.8 C)  98.6 F (37 C)   TempSrc: Oral  Oral   SpO2: 98%  97%   Weight:      Height:      PainSc:  0-No pain      Intake/Output Summary (Last 24 hours) at 05/07/2020 0842 Last data filed at 05/07/2020 4431 Gross per 24 hour  Intake 0 ml  Output 1000 ml  Net -1000 ml   Filed Weights   05/02/20 1410  Weight: 82.6 kg   Weight change:   Intake/Output from previous day: 03/09 0701 - 03/10 0700 In: 0  Out: 1000 [Urine:1000] Intake/Output this shift: No intake/output data  recorded. Filed Weights   05/02/20 1410  Weight: 82.6 kg    Examination:  General exam: AAOx3, older for age,NAD, weak appearing. HEENT:Oral mucosa moist, Ear/Nose WNL grossly, dentition normal. Respiratory system: bilaterally diminishedd,no wheezing or crackles,no use of accessory muscle Cardiovascular system: S1 & S2 +, No JVD,. Gastrointestinal system: Abdomen soft, NT,ND, BS+ Nervous System:Alert, awake, sensation intact able to move the leg somewhat as bilateral diffuse deconditioning weakness  Extremities: b/l ankle edema 2-3+, distal peripheral pulses palpable.  Skin: No rashes,no icterus. MSK: Normal muscle bulk,tone, power   Data Reviewed: I have personally reviewed following labs and imaging studies CBC: Recent Labs  Lab 05/02/20 1524 05/03/20 0609 05/04/20 0527 05/07/20 0539  WBC 4.6 3.8* 3.7* 1.6*  HGB 9.3* 7.9* 8.1* 8.5*  HCT 28.8* 24.3* 25.3* 26.7*  MCV 90.0 90.3 92.0 91.1  PLT 129* 91* 94* 409*   Basic Metabolic Panel: Recent Labs  Lab 05/02/20 1524 05/03/20 0609 05/04/20 0527 05/05/20 0459 05/07/20 0539  NA  139 140 140 138 138  K 2.9* 3.5 3.4* 3.4* 3.9  CL 103 106 105 105 105  CO2 20* 22 20* 20* 20*  GLUCOSE 103* 98 97 98 105*  BUN 13 11 10 10 11   CREATININE 0.78 0.67 0.63 0.54 0.67  CALCIUM 7.9* 7.5* 7.6* 7.9* 8.7*  MG 2.1  --   --   --   --   PHOS 3.0  --   --   --   --    GFR: Estimated Creatinine Clearance: 76.3 mL/min (by C-G formula based on SCr of 0.67 mg/dL). Liver Function Tests: Recent Labs  Lab 05/02/20 1524 05/03/20 0609 05/04/20 0527  AST 129* 107* 103*  ALT 43 37 39  ALKPHOS 194* 148* 169*  BILITOT 2.6* 2.5*  2.4* 1.9*  PROT 5.4* 4.6* 4.5*  ALBUMIN 3.1* 2.6* 2.6*   Recent Labs  Lab 05/02/20 1524  LIPASE 69*   No results for input(s): AMMONIA in the last 168 hours. Coagulation Profile: No results for input(s): INR, PROTIME in the last 168 hours. Cardiac Enzymes: No results for input(s): CKTOTAL, CKMB, CKMBINDEX, TROPONINI in the last 168 hours. BNP (last 3 results) No results for input(s): PROBNP in the last 8760 hours. HbA1C: No results for input(s): HGBA1C in the last 72 hours. CBG: No results for input(s): GLUCAP in the last 168 hours. Lipid Profile: No results for input(s): CHOL, HDL, LDLCALC, TRIG, CHOLHDL, LDLDIRECT in the last 72 hours. Thyroid Function Tests: No results for input(s): TSH, T4TOTAL, FREET4, T3FREE, THYROIDAB in the last 72 hours. Anemia Panel: No results for input(s): VITAMINB12, FOLATE, FERRITIN, TIBC, IRON, RETICCTPCT in the last 72 hours. Sepsis Labs: Recent Labs  Lab 05/02/20 1524 05/03/20 1400  LATICACIDVEN 4.2* 2.9*    Recent Results (from the past 240 hour(s))  Culture, Urine     Status: Abnormal   Collection Time: 04/30/20  1:00 PM   Specimen: Urine, Clean Catch  Result Value Ref Range Status   Specimen Description   Final    URINE, CLEAN CATCH Performed at [facility], [place] [address], [place], [place] [address]    Special Requests   Final    NONE Performed at [facility], [place] [address] [place], [place] [address]    Culture 20,000 COLONIES/mL ENTEROCOCCUS FAECALIS (A)  Final   Report Status 05/02/2020 FINAL  Final   Organism ID, Bacteria ENTEROCOCCUS FAECALIS (A)  Final      Susceptibility   Enterococcus  faecalis - MIC*    AMPICILLIN <=2 SENSITIVE Sensitive     NITROFURANTOIN <=16 SENSITIVE Sensitive     VANCOMYCIN 2 SENSITIVE Sensitive     * 20,000 COLONIES/mL ENTEROCOCCUS FAECALIS  SARS CORONAVIRUS 2 (TAT 6-24 HRS) Nasopharyngeal Nasopharyngeal Swab     Status: None   Collection Time: 05/02/20  7:15 PM   Specimen: Nasopharyngeal Swab  Result Value Ref Range Status   SARS Coronavirus 2 NEGATIVE NEGATIVE Final    Comment: (NOTE) SARS-CoV-2 target nucleic acids are NOT DETECTED.  The SARS-CoV-2 RNA is generally detectable in upper and lower respiratory specimens during the acute phase of infection. Negative results do not preclude SARS-CoV-2 infection, do not rule out co-infections with other pathogens, and should not be used as the sole basis for treatment or other patient management decisions. Negative results must be combined with clinical observations, patient history, and epidemiological information. The expected result is Negative.  Fact Sheet for Patients: SugarRoll.be  Fact Sheet for Healthcare Providers: https://www.woods-mathews.com/  This test is not yet approved or cleared by the [place] FDA and  has been authorized for detection and/or diagnosis of SARS-CoV-2 by FDA under an Emergency Use Authorization (EUA). This EUA will remain  in effect (meaning this test can be used) for the duration of the COVID-19 declaration under Se ction 564(b)(1) of the Act, 21 U.S.C. section 360bbb-3(b)(1), unless the authorization is terminated or revoked sooner.  Performed at [facility], [place] [address] [place], [place] [address]      Radiology Studies: MR BRAIN W WO  CONTRAST  Result Date: 05/06/2020 CLINICAL DATA:  Staging of intracranial meningeal carcinomatosis. EXAM: MRI HEAD WITHOUT AND WITH CONTRAST TECHNIQUE: Multiplanar, multiecho pulse sequences of the brain and surrounding structures were obtained without and with intravenous contrast. CONTRAST:  37mL GADAVIST GADOBUTROL 1 MMOL/ML IV SOLN COMPARISON:  03/26/2020 FINDINGS: Brain: No acute infarct, mass effect or extra-axial collection. No acute or chronic hemorrhage. 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 The midline structures are normal. There is unchanged dural thickening, right greater than left. The thickest portion is at the anterior right convexity, measuring 6 mm, unchanged. There is a 7 mm extra-axial mass within the left posterior fossa that is unchanged (17:7. A multilobular inter hemispheric lesion measures 3.6 x 1.0 cm, unchanged. Vascular: Major flow voids are preserved. Skull and upper cervical spine: Generally low T1-weighted signal throughout the calvarium Sinuses/Orbits:No paranasal sinus fluid levels or advanced mucosal thickening. No mastoid or middle ear effusion. Normal orbits. Other: Unchanged appearance of numerous subcutaneous lesions. IMPRESSION: 1. Unchanged right-greater-than-left dural thickening, which is greatest at the anterior right convexity. 2. Unchanged inter hemispheric extra-axial mass, likely metastatic disease. 3. Unchanged 7 mm extra-axial lesion of the left posterior fossa, which may be a meningioma or a focus of metastatic disease. Electronically Signed   By: Ulyses Jarred M.D.   On: 05/06/2020 22:44     LOS: 4 days   Antonieta Pert, MD [facility]  05/07/2020, 8:42 AM

## 2020-05-07 NOTE — Care Management Important Message (Signed)
Important Message  Patient Details IM Letter given to the Patient. Name: Brooke [name] MRN: [id number] Date of Birth: 07/29/1950   Medicare Important Message Given:  Yes     Kerin Salen 05/07/2020, 10:28 AM

## 2020-05-07 NOTE — Telephone Encounter (Addendum)
Oral Oncology Patient Advocate Encounter  Prior Authorization for [name] been approved.    PA# [id number] Effective dates: 05/07/20 through 11/03/20  Patients co-pay is $100  Oral Oncology Clinic will continue to follow.   Brooke [name] Patient [place] Phone [phone] Fax [phone] 05/07/2020 9:44 AM

## 2020-05-07 NOTE — Progress Notes (Signed)
Daily Progress Note   Patient Name: Brooke [name]       Date: 05/07/2020 DOB: March 07, 1950  Age: 70 y.o. MRN#: [id number] Attending Physician: Antonieta Pert, MD Primary Care Physician: [name], MD Admit Date: 05/02/2020  Reason for Consultation/Follow-up: Establishing goals of care  Subjective: I saw and examined Brooke [name] this evening.  She was lying in bed in no distress.  Reports she is tired.    We discussed plan to transition to rehab on discharge (she is not looking forward to this but is agreeable) as well as plan for trial of oral antiestrogen therapy.  Length of Stay: 4  Current Medications: Scheduled Meds:  . anastrozole  1 mg Oral Daily  . Chlorhexidine Gluconate Cloth  6 each Topical Daily  . dexamethasone  4 mg Intravenous Q12H  . feeding supplement  1 Container Oral TID BM  . furosemide  20 mg Intravenous Q12H  . losartan  12.5 mg Oral Daily  . nitrofurantoin (macrocrystal-monohydrate)  100 mg Oral Q12H  . ondansetron (ZOFRAN) IV  4 mg Intravenous Q8H  . pantoprazole (PROTONIX) IV  40 mg Intravenous Q24H  . polyethylene glycol  17 g Oral Daily    Continuous Infusions:   PRN Meds: acetaminophen **OR** acetaminophen, hydrALAZINE, LORazepam, metoCLOPramide (REGLAN) injection, prochlorperazine, Zinc Oxide  Physical Exam         General: Awake, alert, chronically ill-appearing HEENT: No bruits, no goiter, no JVD Lungs: Good air movement, clear Abdomen: Soft, nondistended, positive bowel sounds.  Ext: Significant edema Skin: Warm and dry  Vital Signs: BP (!) 151/65 (BP Location: Right Arm)   Pulse 100   Temp 97.9 F (36.6 C) (Oral)   Resp 16   Ht 5\' 9"  (1.753 m)   Wt 82.6 kg   LMP 03/18/1997   SpO2 91%   BMI 26.88 kg/m  SpO2: SpO2: 91 % O2  Device: O2 Device: Room Air O2 Flow Rate:    Intake/output summary:   Intake/Output Summary (Last 24 hours) at 05/07/2020 2334 Last data filed at 05/07/2020 1844 Gross per 24 hour  Intake 758 ml  Output 2600 ml  Net -1842 ml   LBM: Last BM Date:  (PTA per pt) Baseline Weight: Weight: 82.6 kg Most recent weight: Weight: 82.6 kg       Palliative Assessment/Data:  Patient Active Problem List   Diagnosis Date Noted  . Malnutrition of moderate degree 05/05/2020  . Intractable vomiting with nausea 05/03/2020  . Acute lower UTI 04/21/2020  . Dehydration 04/21/2020  . Depression with anxiety 04/21/2020  . Transaminitis 04/21/2020  . Metastatic malignant neoplasm ([place])   . Nausea vomiting and diarrhea   . Intractable nausea and vomiting 04/20/2020  . PICC (peripherally inserted central catheter) in place 04/17/2020  . Bone metastases ([place]) 03/11/2020  . Liver metastases ([place]) 03/05/2020  . Lung metastases ([place]) 03/05/2020  . Postoperative state 04/23/2019  . Superficial basal cell carcinoma (BCC) 02/10/2019  . Goals of care, counseling/discussion 12/14/2017  . Family history of uterine cancer   . Family history of prostate cancer   . Melanoma in situ of right upper extremity ([place]) 07/18/2017  . Port-A-Cath in place 04/18/2017  . Drug-induced neutropenia ([place]) 03/09/2017  . Family history of breast cancer 02/02/2017  . Family history of colon cancer 02/02/2017  . Family history of prostate cancer in father 02/02/2017  . Malignant neoplasm of overlapping sites of left breast in female, estrogen receptor positive ([place]) 01/31/2017  . Hx of hot flashes, menopausal, HRT in the past 12/19/2011  . Osteopenia, stable, DEXA 2013, recs to repeat in 2018 12/19/2011  . Hx of Hyperlipidemia, LDL 144 in 07/2011 12/19/2011  . Vitamin D insufficiency 12/19/2011  . Fibroid     Palliative Care Assessment & Plan   Patient Profile: 70 y/o female with metastatic breast cancer followed  by Dr. [name] who presented with n/v and generalized weakness. She was admitted a couple of weeks ago with similar presentation. Work-up consistent with UTI for which she is on appropriate antibiotics. Palliative care followed her during previous hospitalization and was re-consulted this admission for ongoing goal of care discussion and symptom management.   Recommendations/Plan: DNR/DNI MRI reviewed and no significant changes. She is committed to trying to get stronger and continuing disease modifying therapy. Plan for rehab on discharge and recommend palliative care follow-up at facility at time of discharge.  Goals of Care and Additional Recommendations: Limitations on Scope of Treatment: Full Scope Treatment  Code Status:    Code Status Orders  (From admission, onward)         Start     Ordered   05/02/20 1941  Do not attempt resuscitation (DNR)  Continuous       Question Answer Comment  In the event of cardiac or respiratory ARREST Do not call a "code blue"   In the event of cardiac or respiratory ARREST Do not perform Intubation, CPR, defibrillation or ACLS   In the event of cardiac or respiratory ARREST Use medication by any route, position, wound care, and other measures to relive pain and suffering. May use oxygen, suction and manual treatment of airway obstruction as needed for comfort.   Comments Confirmed with patient      05/02/20 1940        Code Status History    Date Active Date Inactive Code Status Order ID Comments User Context   04/22/2020 1030 04/24/2020 2028 DNR [id number]  Micheline Rough, MD Inpatient   04/20/2020 1807 04/22/2020 1029 Full Code [id number]  Jonnie Finner, DO ED   04/23/2019 1126 04/24/2019 1408 Full Code [id number]  [name], MD Inpatient   04/23/2019 0751 04/23/2019 1126 Full Code [id number]  [name], MD Inpatient   09/25/2017 1520 09/26/2017 1836 Full Code [id number]  Jovita Kussmaul, MD Inpatient   03/02/2017 1542 03/03/2017  Zyair Rhein Full  Code [id number]  Jovita Kussmaul, MD Inpatient   Advance Care Planning Activity      Prognosis:  Unable to determine  Discharge Planning: To Be Determined  Care plan was discussed with patient  Thank you for allowing the Palliative Medicine Team to assist in the care of this patient.   Time In: 1730 Time Out: 1800 Total Time 30 Prolonged Time Billed No   Greater than 50%  of this time was spent counseling and coordinating care related to the above assessment and plan.  Micheline Rough, MD  Please contact Palliative Medicine Team phone at [phone] for questions and concerns.

## 2020-05-07 NOTE — Progress Notes (Addendum)
HEMATOLOGY-ONCOLOGY PROGRESS NOTE  Patient Care Team: [name], MD as PCP - General (Family Medicine) Rolm Bookbinder, MD as Attending Physician (Dermatology) Meshulem Onorato, Virgie Dad, MD as Consulting Physician (Oncology) Kyung Rudd, MD as Consulting Physician (Radiation Oncology) Jovita Kussmaul, MD as Consulting Physician (General Surgery) Irene Limbo, MD as Consulting Physician (Plastic Surgery) Delice Bison, Charlestine Massed, NP as Nurse Practitioner (Hematology and Oncology) [name], MD as Consulting Physician (Obstetrics and Gynecology) Laurey Morale, MD as Consulting Physician (Family Medicine) OTHER MD:  SUBJECTIVE: Shanicka is laying in bed this morning with her feet partially out of the bed.  She has her purse in the bed with her.  She tells me that she needs to get up to void.  However, she does not realize that she has a Foley catheter in place.  She reports mild abdominal discomfort as well as mild nausea this morning.  No vomiting reported.  Oncology History Overview Note  Negative genetic testing   Malignant neoplasm of overlapping sites of left breast in female, estrogen receptor positive ([place])  01/26/2017 Initial Diagnosis   in the left breast, a cT2 pN1 invasive ductal carcinoma (with some lobular features but E-cadherin positive), grade 2, estrogen and progesterone receptor positive, HER-2 not amplified, with an MIB-1 of 15-20%             (a) breast MRI 02/04/2017 suggests a T3 N1 tumor   02/01/2017 Cancer Staging   Staging form: Breast, AJCC 8th Edition - Clinical stage from 02/01/2017: Stage IV (cT3, cN1, cM1, G2, ER+, PR+, HER2-) - Signed by Chauncey Cruel, MD on 03/05/2020 Staging comments: Staged at breast conference on 12.5.18   03/03/2017 - 08/01/2017 Neo-Adjuvant Chemotherapy   started neoadjuvant cyclophosphamide/docetaxel 03/03/2017, discontinued after 1 cycle with very poor tolerance             (a) started  cyclophosphamide/methotrexate/fluorouracil (CMF) 03/28/2017, repeated x7 cycles, last dose 08/01/2017   09/25/2017 Surgery   in the right breast, a complex sclerosing lesion, status post lumpectomy, with no malignancy identified   09/25/2017 Surgery   status post left lumpectomy and axillary lymph node dissection 09/25/17 for a ypT3 ypN2, stage IIIA invasive ductal carcinoma, grade 2, again estrogen and progesterone receptor positive, HER-2 not amplified   10/11/2017 Cancer Staging   Staging form: Breast, AJCC 8th Edition - Pathologic: No Stage Recommended (ypT3, pN2a, cM1, G2, ER+, PR+, HER2-) - Signed by Chauncey Cruel, MD on 03/05/2020   11/07/2017 - 12/21/2017 Radiation Therapy   adjuvant radiation completed 12/21/2017             (a) capecitabine sensitization tolerated only the initial week of radiation (b) Site/dose: The patient initially received a dose of 50.4 Gy in 28 fractions to the left chest wall and left supraclavicular region. This was delivered using a 3-D conformal, 4 field technique. The patient then received a boost to the mastectomy scar. This delivered an additional 10 Gy in 5 fractions using an en face electron field. The total dose was 60.4 Gy.     12/12/2017 Genetic Testing   Negative genetic testing on the Multi-cancer panel.  The Multi-Gene Panel offered by [facility] includes sequencing and/or deletion duplication testing of the following 84 genes: AIP, ALK, APC, ATM, AXIN2,BAP1,  BARD1, BLM, BMPR1A, BRCA1, BRCA2, BRIP1, CASR, CDC73, CDH1, CDK4, CDKN1B, CDKN1C, CDKN2A (p14ARF), CDKN2A (p16INK4a), CEBPA, CHEK2, CTNNA1, DICER1, DIS3L2, EGFR (c.2369C>T, p.Thr790Met variant only), EPCAM (Deletion/duplication testing only), FH, FLCN, GATA2, GPC3, GREM1 (Promoter region deletion/duplication testing only),  HOXB13 (c.251G>A, p.Gly84Glu), HRAS, KIT, MAX, MEN1, MET, MITF (c.952G>A, p.Glu318Lys variant only), MLH1, MSH2, MSH3, MSH6, MUTYH, NBN, NF1, NF2, NTHL1, PALB2, PDGFRA, PHOX2B,  PMS2, POLD1, POLE, POT1, PRKAR1A, PTCH1, PTEN, RAD50, RAD51C, RAD51D, RB1, RECQL4, RET, RUNX1, SDHAF2, SDHA (sequence changes only), SDHB, SDHC, SDHD, SMAD4, SMARCA4, SMARCB1, SMARCE1, STK11, SUFU, TERC, TERT, TMEM127, TP53, TSC1, TSC2, VHL, WRN and WT1.  The report date is 12/12/2017.   02/2018 -  Anti-estrogen oral therapy   Tamoxifen daily    Miscellaneous   (8) Foundation 1 testing shows stable microsatellite status and 1 mutation/ Mb, TP53 mutations x2             (a) PD-L1 dated July 2019 negative     03/30/2020 -  Chemotherapy    Patient is on Treatment Plan: BREAST PACLITAXEL D1,8,15 Q28D      Port-A-Cath in place  Liver metastases ([place])  03/05/2020 Initial Diagnosis   Liver metastases ([place])   03/30/2020 -  Chemotherapy    Patient is on Treatment Plan: BREAST PACLITAXEL D1,8,15 Q28D      Lung metastases ([place])  03/05/2020 Initial Diagnosis   Lung metastases ([place])   03/30/2020 -  Chemotherapy    Patient is on Treatment Plan: BREAST PACLITAXEL D1,8,15 Q28D         REVIEW OF SYSTEMS:   A 14 point review of systems is noncontributory except as noted in the HPI.  Past Medical History:  Diagnosis Date   Anemia    Anxiety    Arthritis    ASCUS of cervix with negative high risk HPV 12/2018   Bilateral dry eyes    Breast cancer, left breast ([place])    Family history of breast cancer 02/02/2017   Family history of breast cancer    Family history of colon cancer 02/02/2017   Family history of colon cancer    Family history of prostate cancer    Family history of prostate cancer in father 02/02/2017   Family history of uterine cancer    Fibroid    History of kidney stones    Hyperlipidemia    Hypertension    Melanoma of upper arm ([place]) 2009   RIGHT ARM    Obesity (BMI 30.0-34.9)    Osteopenia 09/2017   T score -1.9 max 8% / 0.8% statistically significant decline at right and left hip stable at the spine   PONV (postoperative nausea and vomiting)    Squamous carcinoma      of the skin   UTI (urinary tract infection)    Varicose veins of both lower extremities    Vitamin D deficiency 07/2009   LOW VITAMIN D 29   Vitamin deficiency 07/2008   VITAMIN D LOW 23   Past Surgical History:  Procedure Laterality Date   ABDOMINAL SURGERY  1975   ovairan cyst    APPENDECTOMY     BREAST LUMPECTOMY WITH RADIOACTIVE SEED LOCALIZATION Right 09/25/2017   Procedure: RIGHT BREAST LUMPECTOMY WITH RADIOACTIVE SEED LOCALIZATION;  Surgeon: Jovita Kussmaul, MD;  Location: [facility];  Service: General;  Laterality: Right;   BREAST SURGERY     bio   CHOLECYSTECTOMY     DERMOID CYST  EXCISION     DILATATION & CURETTAGE/HYSTEROSCOPY WITH MYOSURE N/A 04/23/2019   Procedure: DILATATION & CURETTAGE/HYSTEROSCOPY WITH MYOSURE;  Surgeon: [name], MD;  Location: [facility];  Service: Gynecology;  Laterality: N/A;  request to follow at 9:30am in [facility] block requests 45 minutes   DILATION AND  CURETTAGE OF UTERUS  1998   GALLBLADDER SURGERY     HYSTEROSCOPY     insertion  port a cath  03/02/2016   MASTECTOMY MODIFIED RADICAL Left 09/25/2017   Procedure: LEFT MODIFIED RADICAL MASTECTOMY;  Surgeon: Jovita Kussmaul, MD;  Location: [facility];  Service: General;  Laterality: Left;   PORTACATH PLACEMENT N/A 03/02/2017   Procedure: INSERTION PORT-A-CATH;  Surgeon: Jovita Kussmaul, MD;  Location: [facility];  Service: General;  Laterality: N/A;   SKIN BIOPSY     SKIN CANCER EXCISION  2010,2011   2010-RIGHT ARM, 2011-LEFT LOWER LEG.-DR Rolm Bookbinder DERM. DR. Sarajane Jews IS HER DERM SURGEON.   FAMILY HISTORY      Family History  Problem Relation Age of Onset   Diabetes Father     Hypertension Father     Heart disease Father     Prostate cancer Father 68   Arthritis Father     Breast cancer Sister 45        again at age 59, GT results unk   Skin cancer Sister     Colon cancer Paternal Grandfather 85   Hypertension Mother     Heart failure Mother     Uterine cancer Sister  70        stage IV   Breast cancer Cousin          mat first cousin dx under 62  The patient's father was diagnosed with prostate cancer in his 47s and died at age 14 from complications of that disease.  The patient's mother died at age 70 with congestive heart failure.  The patient had no brothers, 3 sisters.  One sister had breast cancer at age 4 and again at age 22.  She has been genetically tested and was found to be BRCA not mutated in addition a paternal grandfather had colon cancer in his 42s.  There is no history of ovarian cancer in the family to the patient's knowledge.     GYNECOLOGIC HISTORY:  Patient's last menstrual period was 03/18/1997. Menarche age 86. The patient is GX P0.  She stopped having periods in 1999 and took hormone replacement approximately 5 years.     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  She is now retired.  She is single and lives alone with no pets.                    ADVANCED DIRECTIVES: Not in place     HEALTH MAINTENANCE: Social History        Tobacco Use   Smoking status: Never Smoker   Smokeless tobacco: Never Used  Vaping Use   Vaping Use: Never used  Substance Use Topics   Alcohol use: No   Drug use: No                Colonoscopy: Never             PAP: 12/2019, negative             Bone density: 2017/osteopenia  Allergies  Allergen Reactions   Neosporin [Neomycin-Bacitracin Zn-Polymyx] Swelling, Rash and Other (See Comments)    Blisters   Bactrim [Sulfamethoxazole-Trimethoprim]     Made her feel very sick   Benzalkonium Chloride Other (See Comments)    Redness (Pt is unaware)  It works by killing microorganisms and inhibiting their future growth, and for this reason frequently  appears as an ingredient in antibacterial hand wipes, antiseptic creams and anti-itch ointments.   Latex Rash    PHYSICAL  EXAMINATION: ECOG PERFORMANCE STATUS: 3 - Symptomatic, >50% confined to bed  Vitals:   05/07/20 0454 05/07/20 0615  BP: (!) 169/69 (!) 163/64  Pulse: 91 91  Resp: 18   Temp: 98.6 F (37 C)   SpO2: 97%    Filed Weights   05/02/20 1410  Weight: 82.6 kg    Intake/Output from previous day: 03/09 0701 - 03/10 0700 In: 0  Out: 1000 [Urine:1000]  GENERAL: Chronically ill-appearing female, gagging at times due to nausea SKIN: skin color, texture, turgor are normal, no rashes or significant lesions OROPHARYNX: No thrush or mucositis LUNGS: clear to auscultation and percussion with normal breathing effort HEART: regular rate & rhythm and no murmurs, pitting edema to the bilateral lower extremities ABDOMEN: Positive bowel sounds, tenderness with palpation  NEURO: alert & oriented x 3 with fluent speech, no focal motor/sensory deficits  LABORATORY DATA:  I have reviewed the data as listed CMP Latest Ref Rng & Units 05/07/2020 05/05/2020 05/04/2020  Glucose 70 - 99 mg/dL 105(H) 98 97  BUN 8 - 23 mg/dL $Remove'11 10 10  '[id number]$ Creatinine 0.44 - 1.00 mg/dL 0.67 0.54 0.63  Sodium 135 - 145 mmol/L 138 138 140  Potassium 3.5 - 5.1 mmol/L 3.9 3.4(L) 3.4(L)  Chloride 98 - 111 mmol/L 105 105 105  CO2 22 - 32 mmol/L 20(L) 20(L) 20(L)  Calcium 8.9 - 10.3 mg/dL 8.7(L) 7.9(L) 7.6(L)  Total Protein 6.5 - 8.1 g/dL - - 4.5(L)  Total Bilirubin 0.3 - 1.2 mg/dL - - 1.9(H)  Alkaline Phos 38 - 126 U/L - - 169(H)  AST 15 - 41 U/L - - 103(H)  ALT 0 - 44 U/L - - 39    Lab Results  Component Value Date   WBC 1.6 (L) 05/07/2020   HGB 8.5 (L) 05/07/2020   HCT 26.7 (L) 05/07/2020   MCV 91.1 05/07/2020   PLT 125 (L) 05/07/2020   NEUTROABS 4.6 04/30/2020    DG Abd 1 View  Result Date: 05/02/2020 CLINICAL DATA:  The joint with nausea and vomiting. EXAM: ABDOMEN - 1 VIEW COMPARISON:  Radiograph 04/20/2020, but CT 03/05/2020 FINDINGS: No evidence of free air on this supine view. Slight increased air throughout not  abnormally distended colon. Probable barium in the appendix. There is a 2.2 cm stone in the region of the right renal pelvis. Granulomatous calcifications of the liver and spleen. Cholecystectomy clips in the right upper quadrant. Known osseous metastatic disease on prior PET is not well visualized by radiograph. IMPRESSION: 1. Nonobstructive bowel gas pattern. Mild increased air throughout the colon without abnormal distension is nonspecific in the setting. 2. Right renal stone. Electronically Signed   By: Keith Rake M.D.   On: 05/02/2020 20:13   CT Head Wo Contrast  Result Date: 05/02/2020 CLINICAL DATA:  70 year old female with metastatic breast cancer. Evaluate for brain metastases. EXAM: CT HEAD WITHOUT CONTRAST TECHNIQUE: Contiguous axial images were obtained from the base of the skull through the vertex without intravenous contrast. COMPARISON:  03/26/2020 brain MR FINDINGS: Brain: Parasagittal mass along the mid falx (series 2: Image 22-23) does not appear significantly changed from 03/26/2020 MR. The LEFT cerebellar lesion identified on recent MR is difficult to visualize on this study. No new masses/metastatic disease is identified on this noncontrast study. There is no evidence of acute infarct, midline shift, hydrocephalus or hemorrhage. Vascular: No hyperdense vessel  or unexpected calcification. Skull: The known skull lesions identified on recent MR are difficult to visualize on this study. Sinuses/Orbits: No acute abnormality Other: None IMPRESSION: 1. No acute abnormality. No new masses/metastatic disease on this noncontrast study. 2. No significant change in parafalcine mass/metastasis. 3. The LEFT cerebellar lesion and skull lesions identified on recent MR are difficult to visualize on this study. Electronically Signed   By: Margarette Canada M.D.   On: 05/02/2020 16:01   MR BRAIN W WO CONTRAST  Result Date: 05/06/2020 CLINICAL DATA:  Staging of intracranial meningeal carcinomatosis. EXAM: MRI  HEAD WITHOUT AND WITH CONTRAST TECHNIQUE: Multiplanar, multiecho pulse sequences of the brain and surrounding structures were obtained without and with intravenous contrast. CONTRAST:  26mL GADAVIST GADOBUTROL 1 MMOL/ML IV SOLN COMPARISON:  03/26/2020 FINDINGS: Brain: No acute infarct, mass effect or extra-axial collection. No acute or chronic hemorrhage. 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 The midline structures are normal. There is unchanged dural thickening, right greater than left. The thickest portion is at the anterior right convexity, measuring 6 mm, unchanged. There is a 7 mm extra-axial mass within the left posterior fossa that is unchanged (17:7. A multilobular inter hemispheric lesion measures 3.6 x 1.0 cm, unchanged. Vascular: Major flow voids are preserved. Skull and upper cervical spine: Generally low T1-weighted signal throughout the calvarium Sinuses/Orbits:No paranasal sinus fluid levels or advanced mucosal thickening. No mastoid or middle ear effusion. Normal orbits. Other: Unchanged appearance of numerous subcutaneous lesions. IMPRESSION: 1. Unchanged right-greater-than-left dural thickening, which is greatest at the anterior right convexity. 2. Unchanged inter hemispheric extra-axial mass, likely metastatic disease. 3. Unchanged 7 mm extra-axial lesion of the left posterior fossa, which may be a meningioma or a focus of metastatic disease. Electronically Signed   By: Ulyses Jarred M.D.   On: 05/06/2020 22:44   MR CERVICAL SPINE W WO CONTRAST  Result Date: 05/04/2020 CLINICAL DATA:  Breast carcinoma. Difficulty swallowing and lower extremity weakness. EXAM: MRI TOTAL SPINE WITHOUT AND WITH CONTRAST TECHNIQUE: Multisequence MR imaging of the spine from the cervical spine to the sacrum was performed prior to and following IV contrast administration for evaluation of spinal metastatic disease. CONTRAST:  60mL GADAVIST GADOBUTROL 1 MMOL/ML IV SOLN COMPARISON:  None. FINDINGS: MRI CERVICAL SPINE  FINDINGS Alignment: Physiologic. Vertebrae: There is diffuse abnormal signal throughout the cervical vertebral column. Lesions involve the vertebral bodies and the spinous processes. There is no compression fracture. Cord: No abnormal enhancement in the spinal cord. No epidural enhancement. Posterior Fossa, vertebral arteries, paraspinal tissues: Incompletely visualized dural-based mass in the left posterior fossa, unchanged. Disc levels: Degenerative disc disease greatest at C5-6 and C6-7 without spinal canal stenosis. MRI THORACIC SPINE FINDINGS Alignment:  Physiologic. Vertebrae: Diffusely abnormal signal within the thoracic vertebral column with greatest lesion at T9. No compression fracture. Cord: There are 2 small foci of abnormal contrast enhancement along the left dorsal aspect of the distal spinal cord at the T12 level. No epidural enhancement. Paraspinal and other soft tissues: Negative Disc levels: No spinal canal stenosis MRI LUMBAR SPINE FINDINGS Segmentation:  Normal Alignment:  Normal Vertebrae: Diffusely abnormal bone marrow signal. No compression fracture. Conus medullaris: Extends to the L1 level and appears normal aside from the lesions at the T12 level described above. Paraspinal and other soft tissues: Negative Disc levels: No spinal canal stenosis. IMPRESSION: 1. Diffuse osseous metastatic disease without pathologic fracture. 2. Two small foci of abnormal contrast enhancement along the left dorsal aspect of the distal spinal cord at the T12  level, likely CSF disseminated metastatic disease. 3. No spinal canal stenosis. Electronically Signed   By: Ulyses Jarred M.D.   On: 05/04/2020 22:58   MR THORACIC SPINE W WO CONTRAST  Result Date: 05/04/2020 CLINICAL DATA:  Breast carcinoma. Difficulty swallowing and lower extremity weakness. EXAM: MRI TOTAL SPINE WITHOUT AND WITH CONTRAST TECHNIQUE: Multisequence MR imaging of the spine from the cervical spine to the sacrum was performed prior to and  following IV contrast administration for evaluation of spinal metastatic disease. CONTRAST:  80mL GADAVIST GADOBUTROL 1 MMOL/ML IV SOLN COMPARISON:  None. FINDINGS: MRI CERVICAL SPINE FINDINGS Alignment: Physiologic. Vertebrae: There is diffuse abnormal signal throughout the cervical vertebral column. Lesions involve the vertebral bodies and the spinous processes. There is no compression fracture. Cord: No abnormal enhancement in the spinal cord. No epidural enhancement. Posterior Fossa, vertebral arteries, paraspinal tissues: Incompletely visualized dural-based mass in the left posterior fossa, unchanged. Disc levels: Degenerative disc disease greatest at C5-6 and C6-7 without spinal canal stenosis. MRI THORACIC SPINE FINDINGS Alignment:  Physiologic. Vertebrae: Diffusely abnormal signal within the thoracic vertebral column with greatest lesion at T9. No compression fracture. Cord: There are 2 small foci of abnormal contrast enhancement along the left dorsal aspect of the distal spinal cord at the T12 level. No epidural enhancement. Paraspinal and other soft tissues: Negative Disc levels: No spinal canal stenosis MRI LUMBAR SPINE FINDINGS Segmentation:  Normal Alignment:  Normal Vertebrae: Diffusely abnormal bone marrow signal. No compression fracture. Conus medullaris: Extends to the L1 level and appears normal aside from the lesions at the T12 level described above. Paraspinal and other soft tissues: Negative Disc levels: No spinal canal stenosis. IMPRESSION: 1. Diffuse osseous metastatic disease without pathologic fracture. 2. 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 3. No spinal canal stenosis. Electronically Signed   By: Ulyses Jarred M.D.   On: 05/04/2020 22:58   MR Lumbar Spine W Wo Contrast  Result Date: 05/04/2020 CLINICAL DATA:  Breast carcinoma. Difficulty swallowing and lower  extremity weakness. EXAM: MRI TOTAL SPINE WITHOUT AND WITH CONTRAST TECHNIQUE: Multisequence MR imaging of the spine from the cervical spine to the sacrum was performed prior to and following IV contrast administration for evaluation of spinal metastatic disease. CONTRAST:  57mL GADAVIST GADOBUTROL 1 MMOL/ML IV SOLN COMPARISON:  None. FINDINGS: MRI CERVICAL SPINE FINDINGS Alignment: Physiologic. Vertebrae: There is diffuse abnormal signal throughout the cervical vertebral column. Lesions involve the vertebral bodies and the spinous processes. There is no compression fracture. Cord: No abnormal enhancement in the spinal cord. No epidural enhancement. Posterior Fossa, vertebral arteries, paraspinal tissues: Incompletely visualized dural-based mass in the left posterior fossa, unchanged. Disc levels: Degenerative disc disease greatest at C5-6 and C6-7 without spinal canal stenosis. MRI THORACIC SPINE FINDINGS Alignment:  Physiologic. Vertebrae: Diffusely abnormal signal within the thoracic vertebral column with greatest lesion at T9. No compression fracture. Cord: There are 2 small foci of abnormal contrast enhancement along the left dorsal aspect of the distal spinal cord at the T12 level. No epidural enhancement. Paraspinal and other soft tissues: Negative Disc levels: No spinal canal stenosis MRI LUMBAR SPINE FINDINGS Segmentation:  Normal Alignment:  Normal Vertebrae: Diffusely abnormal bone marrow signal. No compression fracture. Conus medullaris: Extends to the L1 level and appears normal aside from the lesions at the T12 level described above. Paraspinal and other soft tissues: Negative Disc levels: No spinal canal stenosis.  IMPRESSION: 1. Diffuse osseous metastatic disease without pathologic fracture. 2. 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 3. No spinal canal stenosis. Electronically  Signed   By: Deatra Robinson M.D.   On: 05/04/2020 22:58   DG Chest Port 1 View  Result Date: 05/02/2020 CLINICAL DATA:  Weakness.  Nausea. EXAM: PORTABLE CHEST 1 VIEW COMPARISON:  Chest CT 02/27/2020 FINDINGS: Tip of the right upper extremity PICC in the SVC. Heart is borderline enlarged. Mild hilar prominence which may be due to adenopathy. There also calcified mediastinal and left hilar nodes. Many of the pulmonary nodules on prior CT are not seen. There is a calcified granuloma at the left lung base. No confluent consolidation. No pleural fluid. No pneumothorax. Postsurgical change of the left axilla and chest wall. Known osseous metastatic disease is not well detected by radiograph. IMPRESSION: 1. No acute findings. 2. Bilateral hilar prominence may be due to adenopathy in this patient with known metastatic breast cancer. 3. Right upper extremity PICC tip in the SVC. Electronically Signed   By: Narda Rutherford M.D.   On: 05/02/2020 15:08   DG Abdomen Acute W/Chest  Result Date: 04/20/2020 CLINICAL DATA:  Vomiting cancer EXAM: DG ABDOMEN ACUTE WITH 1 VIEW CHEST COMPARISON:  CT chest 02/27/2020, PET CT 03/05/2020 FINDINGS: Single view chest demonstrates right upper extremity central venous catheter tip at the cavoatrial junction. Postsurgical changes of left chest and axilla. No focal opacity or pleural effusion. Normal cardiomediastinal silhouette. Supine and upright views of the abdomen demonstrate no free air beneath the diaphragm. Surgical clips in the right upper quadrant. Multiple round calcifications in the left upper quadrant consistent with splenic granuloma. Calcified left lung base granuloma. Nonobstructed gas pattern. Staghorn calculus right kidney. Known skeletal metastatic disease is better seen on PET CT. IMPRESSION: Nonobstructed gas pattern. Staghorn calculus right kidney. No radiographic evidence for acute cardiopulmonary abnormality. Electronically Signed   By: Jasmine Pang M.D.   On:  04/20/2020 16:21   IR PICC PLACEMENT RIGHT >5 YRS INC IMG GUIDE  Result Date: 04/10/2020 INDICATION: Patient with history of metastatic breast cancer and poor venous access; central venous access requested for chemotherapy. EXAM: ULTRASOUND AND FLUOROSCOPIC GUIDED PICC LINE INSERTION MEDICATIONS: 1% lidocaine to skin and subcutaneous tissue ANESTHESIA/SEDATION: None FLUOROSCOPY TIME:  Fluoroscopy Time: 1 minutes 6 seconds (6 mGy). COMPLICATIONS: None immediate. TECHNIQUE: The procedure, risks, benefits, and alternatives were explained to the patient and informed written consent was obtained. A timeout was performed prior to the initiation of the procedure. The right upper extremity was prepped with chlorhexidine in a sterile fashion, and a sterile drape was applied covering the operative field. Maximum barrier sterile technique with sterile gowns and gloves were used for the procedure. A timeout was performed prior to the initiation of the procedure. Local anesthesia was provided with 1% lidocaine. Under direct ultrasound guidance, the right basilic vein was accessed with a micropuncture kit after the overlying soft tissues were anesthetized with 1% lidocaine. An ultrasound image was saved for documentation purposes. A guidewire was advanced to the level of the superior caval-atrial junction for measurement purposes and the PICC line was cut to length. A peel-away sheath was placed and a 47 cm, 5 [place], dual lumen was inserted to level of the superior caval-atrial junction. A post procedure spot fluoroscopic was obtained. The catheter easily aspirated and flushed and was sutured in place. A  dressing was placed. The patient tolerated the procedure well without immediate post procedural complication. FINDINGS: After catheter placement, the tip lies within the superior cavoatrial junction the catheter aspirates and flushes normally and is ready for immediate use. IMPRESSION: Successful ultrasound and fluoroscopic  guided placement of a right basilic vein approach, 47 cm, 5 French, dual lumen PICC with tip at the superior caval-atrial junction. The PICC line is ready for immediate use. Read by: Rowe Robert, PA-C Electronically Signed   By: Jacqulynn Cadet M.D.   On: 04/10/2020 09:42   DG ESOPHAGUS W SINGLE CM (SOL OR THIN BA)  Result Date: 04/23/2020 CLINICAL DATA:  Dysphagia. EXAM: ESOPHOGRAM/BARIUM SWALLOW TECHNIQUE: Single contrast examination was performed using  thin barium. FLUOROSCOPY TIME:  Radiation Exposure Index (if provided by the fluoroscopic device): 17.9 mGy. COMPARISON:  None. FINDINGS: Exam is limited as the patient only drank a small amount of contrast. No definite mass or stricture is noted in the esophagus small sliding-type hiatal hernia is noted. No definite reflux is noted. Barium tablet could not be administered as the patient could not be placed in upright position. IMPRESSION: Small sliding-type hiatal hernia. No definite mass or stricture is noted in the esophagus. Electronically Signed   By: Marijo Conception M.D.   On: 04/23/2020 09:15   ASSESSMENT: 70 y.o. [place] woman status post bilateral biopsies 01/26/2017, showing   (1) in the right breast, a complex sclerosing lesion, status post lumpectomy 09/25/2017, with no malignancy identified.   (2) in the left breast, a cT2 pN1 invasive ductal carcinoma (with some lobular features but E-cadherin positive), grade 2, estrogen and progesterone receptor positive, HER-2 not amplified, with an MIB-1 of 15-20%             (a) breast MRI 02/04/2017 suggests a T3 N1 tumor   (3) started neoadjuvant cyclophosphamide/docetaxel 03/03/2017, discontinued after 1 cycle with very poor tolerance             (a) started cyclophosphamide/methotrexate/fluorouracil (CMF) 03/28/2017, repeated x7 cycles, last dose 08/01/2017   (4) status post left modified radical mastectomy 09/25/17 for a ypT5 ypN2, stage IIIA invasive ductal carcinoma, grade 2, again  estrogen and progesterone receptor positive, HER-2 not amplified   (5) adjuvant radiation completed 12/21/2017             (a) capecitabine sensitization tolerated only the initial week of radiation (b) Site/dose: The patient initially received a dose of 50.4 Gy in 28 fractions to the left chest wall and left supraclavicular region. This was delivered using a 3-D conformal, 4 field technique. The patient then received a boost to the mastectomy scar. This delivered an additional 10 Gy in 5 fractions using an en face electron field. The total dose was 60.4 Gy.   ( 6) tamoxifen started 03/09/2018, discontinued January 2022 with metastatic progression   (7) genetics testing 12/12/2017 through the Multi-Gene Panel offered by [facility] found no deleterious mutations in AIP, ALK, APC, ATM, AXIN2,BAP1,  BARD1, BLM, BMPR1A, BRCA1, BRCA2, BRIP1, CASR, CDC73, CDH1, CDK4, CDKN1B, CDKN1C, CDKN2A (p14ARF), CDKN2A (p16INK4a), CEBPA, CHEK2, CTNNA1, DICER1, DIS3L2, EGFR (c.2369C>T, p.Thr790Met variant only), EPCAM (Deletion/duplication testing only), FH, FLCN, GATA2, GPC3, GREM1 (Promoter region deletion/duplication testing only), HOXB13 (c.251G>A, p.Gly84Glu), HRAS, KIT, MAX, MEN1, MET, MITF (c.952G>A, p.Glu318Lys variant only), MLH1, MSH2, MSH3, MSH6, MUTYH, NBN, NF1, NF2, NTHL1, PALB2, PDGFRA, PHOX2B, PMS2, POLD1, POLE, POT1, PRKAR1A, PTCH1, PTEN, RAD50, RAD51C, RAD51D, RB1, RECQL4, RET, RUNX1, SDHAF2, SDHA (sequence changes only), SDHB, SDHC, SDHD, SMAD4, SMARCA4, SMARCB1,  SMARCE1, STK11, SUFU, TERC, TERT, TMEM127, TP53, TSC1, TSC2, VHL, WRN and WT1.    (8) Foundation 1 testing shows stable microsatellite status and 1 mutation/ Mb, TP53 mutations x2             (a) PD-L1 dated July 2019 negative   METASTATIC DISEASE: January 2022 (9) CT angio of the chest 02/27/2020 shows multiple hilar nodes consistent with prior granulomatous infection, multiple small pulmonary nodules, multiple punctate granulomas throughout the  liver and spleen, nodular hepatic contour and areas of mottling possibly due to steatosis, 1.6 cm left adrenal nodule             (a) PET scan 03/05/2020 shows mildly to highly hypermetabolic adenopathy, the pulmonary nodules being mildly to not hypermetabolic, a large right hepatic liver lesion with an SUV of 13.2 and additional hypermetabolic right and left lobe foci, the adrenal lesion being hypermetabolic with an SUV of 16.  There are also peritoneal nodules and innumerable foci of bony involvement.  Incidental note was made of small heterogeneous foci of increased uptake in the cerebellum.  The CT images did not show gross abnormality in the visualized brain             (b) on 03/09/2020 CA 27-29 was 983.0, CEA 744.17, the CA 19-9 normal at 32             (c) liver biopsy 03/13/2020 confirms metastati carcinoma, estrogen receptor positive, progesterone and HER 2 negative, with an Mib-1 of 40%             (d) brain MRI 03/26/2020 shows dural involvement including a 1.0 cm dural-based cerebellar lesion   (10) paclitaxel started 03/30/2020, discontinued after three doses with no evidence of response   (11) zoledronate started 03/19/2020, to be repeated every 12 weeks   (12) anastrozole started 05/07/2020; palbociclib to be added     PLAN: Aryelle is stable today.  Her abdominal discomfort and nausea appear to be the same.  She is not vomiting anymore.  Continue current regimen of antiemetics and bowel regimen.  She had an MRI of the spine performed earlier this week which showed evidence of carcinomatosis but it appears focal and not sure that this explains her symptoms.  An MRI of the brain was performed which remained stable.  Her current symptoms cannot be explained by disease progression.  Recommend for her to continue to work with therapy and for discharge to SNF for short-term rehab.  From our standpoint, we will see her as an outpatient and consider her for antiestrogen therapy plus  Ibrance.   LOS: 4 days   Mikey Bussing, DNP, AGPCNP-BC, AOCNP 05/07/20   ADDENDUM: Jamielee's case remains challenging.  If she had a good functional status we have multiple treatment options and in fact she was tolerating Taxol relatively well.  The problem is that her functional status is extremely poor.  She is essentially bedbound.  She continues to have significant issues with nutrition and hydration, her legs remain very weak, and she would not be able to take care of activities of daily living in her own home.  She is going to need placement.  The MRI of the brain is essentially unchanged.  The MRI of the spine does not show definitive carcinomatosis and ensured I do not see how her disease, which is very extensive, specifically accounts for the problems she is experiencing.  The corollary of that is that even if she did respond to treatment she might  in fact not feel much better.  At any rate she is committed to further treatment.  I am going to switch her to oral therapy which I think will be easier if she does go to a rehab facility and may be more effective in any case.  We are starting anastrozole today and I am going to try to obtain the palbociclib for her to be started hopefully in next week.  In the meantime her family will be coming next week.  She has been reluctant to involve them in her care or perhaps even significantly discussed her situation.  I am hopeful that they will be able to assist her as she continues to facing her challenges and make decisions regarding her care  I personally saw this patient and performed a substantive portion of this encounter with the listed APP documented above.   Chauncey Cruel, MD Medical Oncology and Hematology [facility] [address] [place], [place] [address] Tel. [phone]    Fax. [phone]

## 2020-05-07 NOTE — Progress Notes (Signed)
Occupational Therapy Treatment Patient Details Name: Brooke [name] MRN: [id number] DOB: 09-22-1950 Today's Date: 05/07/2020    History of present illness 70 year old female with history of metastatic breast cancer, hypertension, anxiety presented with intractable nausea vomiting and generalized weakness. Dx of UTI.   OT comments  Patient with flat affect throughout session, would not verbalize to OT only answer questions with head nods. Following directions appropriately. Pt needing max A for bed mobility for LE management and trunk support. Once upright patient able to maintain static balance at supervision level while stedy set up for transfer. With mod A x1-2 and bed height elevated patient able to power up to standing, cues for use of UEs to assist. Patient tolerate 1 additional sit to stand from stedy before too fatigued and transferred into recliner. Patient instructed in UE exercises listed below. Will continue with POC.   Follow Up Recommendations  SNF    Equipment Recommendations  3 in 1 bedside commode;Tub/shower bench       Precautions / Restrictions Precautions Precautions: Fall Precaution Comments: SIRS       Mobility Bed Mobility Overal bed mobility: Needs Assistance Bed Mobility: Rolling;Sidelying to Sit Rolling: Mod assist Sidelying to sit: Max assist;HOB elevated       General bed mobility comments: max A to bring LEs off EOB and upright trunk to sitting    Transfers Overall transfer level: Needs assistance   Transfers: Sit to/from Stand Sit to Stand: Mod assist;From elevated surface;+2 safety/equipment         General transfer comment: mod A x1-2 to power up to standing from EOB, cues to use UEs to assist on stedy. Patient tolerate 1 additional sit to stand from stedy before fatigued and needing to sit into chair    Balance Overall balance assessment: Needs assistance Sitting-balance support: Feet supported Sitting balance-Leahy Scale: Fair      Standing balance support: Bilateral upper extremity supported Standing balance-Leahy Scale: Poor Standing balance comment: able to static stand in stedy for ~10 seconds before fatigues                                        Cognition Arousal/Alertness: Awake/alert Behavior During Therapy: Flat affect Overall Cognitive Status: No family/caregiver present to determine baseline cognitive functioning                                 General Comments: patient did not verbalize at all this session just shaking head yes/no to questions. flat affect        Exercises Exercises: General Upper Extremity General Exercises - Upper Extremity Shoulder Flexion: AAROM;Both;10 reps;Seated Elbow Flexion: AROM;Both;10 reps;Seated Digit Composite Flexion: AROM;Both;10 reps;Seated Composite Extension: AROM;Both;10 reps;Seated           Pertinent Vitals/ Pain       Pain Assessment: Faces Faces Pain Scale: No hurt         Frequency  Min 2X/week        Progress Toward Goals  OT Goals(current goals can now be found in the care plan section)  Progress towards OT goals: Progressing toward goals  Acute Rehab OT Goals Patient Stated Goal: hopes to DC home but agreeable to SNF if needed OT Goal Formulation: With patient Time For Goal Achievement: 05/19/20 Potential to Achieve Goals: Good ADL Goals Pt Will Perform Lower Body  Dressing: with adaptive equipment;sitting/lateral leans;sit to/from stand;with min assist Pt Will Transfer to Toilet: with min assist;stand pivot transfer;bedside commode Pt Will Perform Toileting - Clothing Manipulation and hygiene: with min assist;with adaptive equipment;sitting/lateral leans;sit to/from stand Pt/caregiver will Perform Home Exercise Program: Increased strength;Both right and left upper extremity;With Supervision Additional ADL Goal #1: Pt will improve sitting balance to good without use of UEs in order to increase safety  and participation during seated ADLs.  Plan Discharge plan remains appropriate       AM-PAC OT "6 Clicks" Daily Activity     Outcome Measure   Help from another person eating meals?: None Help from another person taking care of personal grooming?: A Little Help from another person toileting, which includes using toliet, bedpan, or urinal?: Total Help from another person bathing (including washing, rinsing, drying)?: A Lot Help from another person to put on and taking off regular upper body clothing?: A Little Help from another person to put on and taking off regular lower body clothing?: Total 6 Click Score: 14    End of Session Equipment Utilized During Treatment: Gait belt  OT Visit Diagnosis: Unsteadiness on feet (R26.81);Muscle weakness (generalized) (M62.81)   Activity Tolerance Patient limited by fatigue   Patient Left in chair;with call bell/phone within reach;with chair alarm set   Nurse Communication Mobility status;Need for lift equipment        Time: 1307-1330 OT Time Calculation (min): 23 min  Charges: OT General Charges $OT Visit: 1 Visit OT Treatments $Self Care/Home Management : 8-22 mins $Therapeutic Exercise: 8-22 mins  Delbert Phenix OT OT pager: [phone]   Rosemary Holms 05/07/2020, 2:10 PM

## 2020-05-08 ENCOUNTER — Inpatient Hospital Stay: Payer: Medicare PPO

## 2020-05-08 ENCOUNTER — Telehealth: Payer: Self-pay | Admitting: Pharmacist

## 2020-05-08 ENCOUNTER — Other Ambulatory Visit (HOSPITAL_COMMUNITY): Payer: Self-pay | Admitting: Oncology

## 2020-05-08 DIAGNOSIS — R112 Nausea with vomiting, unspecified: Secondary | ICD-10-CM | POA: Diagnosis not present

## 2020-05-08 DIAGNOSIS — Z515 Encounter for palliative care: Secondary | ICD-10-CM | POA: Diagnosis not present

## 2020-05-08 DIAGNOSIS — R410 Disorientation, unspecified: Secondary | ICD-10-CM

## 2020-05-08 DIAGNOSIS — C7801 Secondary malignant neoplasm of right lung: Secondary | ICD-10-CM | POA: Diagnosis not present

## 2020-05-08 DIAGNOSIS — C7951 Secondary malignant neoplasm of bone: Secondary | ICD-10-CM | POA: Diagnosis not present

## 2020-05-08 DIAGNOSIS — L8990 Pressure ulcer of unspecified site, unspecified stage: Secondary | ICD-10-CM | POA: Insufficient documentation

## 2020-05-08 DIAGNOSIS — R531 Weakness: Secondary | ICD-10-CM | POA: Diagnosis not present

## 2020-05-08 DIAGNOSIS — C50812 Malignant neoplasm of overlapping sites of left female breast: Secondary | ICD-10-CM

## 2020-05-08 DIAGNOSIS — E876 Hypokalemia: Secondary | ICD-10-CM | POA: Diagnosis not present

## 2020-05-08 DIAGNOSIS — Z170 Estrogen receptor positive status [ER+]: Secondary | ICD-10-CM

## 2020-05-08 LAB — COMPREHENSIVE METABOLIC PANEL
ALT: 45 U/L — ABNORMAL HIGH (ref 0–44)
AST: 107 U/L — ABNORMAL HIGH (ref 15–41)
Albumin: 2.5 g/dL — ABNORMAL LOW (ref 3.5–5.0)
Alkaline Phosphatase: 188 U/L — ABNORMAL HIGH (ref 38–126)
Anion gap: 14 (ref 5–15)
BUN: 13 mg/dL (ref 8–23)
CO2: 22 mmol/L (ref 22–32)
Calcium: 8.3 mg/dL — ABNORMAL LOW (ref 8.9–10.3)
Chloride: 102 mmol/L (ref 98–111)
Creatinine, Ser: 0.61 mg/dL (ref 0.44–1.00)
GFR, Estimated: >60 mL/min (ref >60)
Glucose, Bld: 112 mg/dL — ABNORMAL HIGH (ref 70–99)
Potassium: 3.4 mmol/L — ABNORMAL LOW (ref 3.5–5.1)
Sodium: 138 mmol/L (ref 135–145)
Total Bilirubin: 2.2 mg/dL — ABNORMAL HIGH (ref 0.3–1.2)
Total Protein: 4.5 g/dL — ABNORMAL LOW (ref 6.5–8.1)

## 2020-05-08 LAB — CBC
HCT: 24.7 % — ABNORMAL LOW (ref 36.0–46.0)
Hemoglobin: 8.1 g/dL — ABNORMAL LOW (ref 12.0–15.0)
MCH: 29.5 pg (ref 26.0–34.0)
MCHC: 32.8 g/dL (ref 30.0–36.0)
MCV: 89.8 fL (ref 80.0–100.0)
Platelets: 116 10*3/uL — ABNORMAL LOW (ref 150–400)
RBC: 2.75 MIL/uL — ABNORMAL LOW (ref 3.87–5.11)
RDW: 23.8 % — ABNORMAL HIGH (ref 11.5–15.5)
WBC: 1.9 10*3/uL — ABNORMAL LOW (ref 4.0–10.5)
nRBC: 1.1 % — ABNORMAL HIGH (ref 0.0–0.2)

## 2020-05-08 MED ORDER — POTASSIUM CHLORIDE CRYS ER 20 MEQ PO TBCR
40.0000 meq | EXTENDED_RELEASE_TABLET | Freq: Once | ORAL | Status: DC
  Filled 2020-05-08: qty 2

## 2020-05-08 MED ORDER — PALBOCICLIB 75 MG PO TABS: 75.0000 mg | ORAL_TABLET | Freq: Every day | ORAL | 6 refills | Status: DC

## 2020-05-08 MED ORDER — DEXAMETHASONE SODIUM PHOSPHATE 4 MG/ML IJ SOLN
2.0000 mg | Freq: Two times a day (BID) | INTRAMUSCULAR | Status: AC
  Administered 2020-05-08: 2 mg via INTRAVENOUS
  Filled 2020-05-08: qty 1

## 2020-05-08 MED ORDER — THIAMINE HCL 100 MG/ML IJ SOLN
100.0000 mg | INTRAMUSCULAR | Status: DC
  Administered 2020-05-12: 100 mg via INTRAVENOUS
  Filled 2020-05-08: qty 2

## 2020-05-08 MED ORDER — THIAMINE HCL 100 MG/ML IJ SOLN
500.0000 mg | Freq: Three times a day (TID) | INTRAVENOUS | Status: AC
  Administered 2020-05-08 – 2020-05-11 (×9): 500 mg via INTRAVENOUS
  Filled 2020-05-08 (×9): qty 5

## 2020-05-08 NOTE — Progress Notes (Signed)
PROGRESS NOTE    Brooke [name]  IWL:[id number] DOB: 11-24-1950 DOA: 05/02/2020 PCP: [name], MD   Chief Complaint  Patient presents with  . Failure To Thrive  . Nausea  Brief Narrative: 70 year old female with metastatic breast cancer, hypertension anxiety disorder anemia admitted with intractable nausea vomiting and generalized weakness.  She follows with Dr. [name] for chemotherapy and cancer care.  After her recent appointment she went home but unable to get up from chair was weak and brought to the ED, found to have UTI recently as well.Patient was admitted with intermittent nausea vomiting and placed on IV fluids in the ED.She is being followed by PT OT for generalized deconditioning weakness.  Subjective: Seen this morning.  Patient remains confused today but appears pleasant and less anxious while she is confused Denies any complaint. Has had good urine output. Assessment & Plan:  Intractable nausea and vomiting Generalized weakness/deconditioning Dry mouth Dehydration with lactic acidosis from poor po ijntake Multifactorial in setting of metastatic disease, UTI, dural involvement of cancer-stable per MRI- neuro reviewed 3/10-no inpatient work-up outpatient EMG NCS advised.  Seen by oncology symptoms not explained by tumor progression.  Recommended skilled nursing facility. No obstructive bowel gas pattern In xray. Cont SLP, Diet as tolerated,Protonixm, Reglan /compazine/Ativan,  And IV Decadron-per oncology will stop it after 1 does in am. Does have significant leg edema likely contributed to her deconditioning and getting diuresed with IV Lasix.    Lower extremity edema b/l echo with normal EF.  Likely due to her Thea Alken level, deconditioning immobility, having good urine output on IV Lasix and kidney functions tolerating.  Monitor renal function output intake.  Elevate the leg Net IO Since Admission: -3,440.44 mL [05/08/20 0812]  UTI with 20,000 colonies of  Enterococcus faecalis Acute urine retention needing Foley Patient on Unasyn-s/px5 days> changed to nitrofurantoin 3/9-continue the same.  Afebrile doing well.  Metastatic breast cancer with the spinal mets MRI of cervical thoracic lumbar spine showed diffuse osseous metastatic disease, MRI brain see above patient stable advised outpatient follow-up.  Plan is for a skilled nursing facility with palliative services.She has had #3 of paclitaxel on 04/30/20.  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   Acute metabolic encephalopathy patient is pleasantly confused has had extensive work-up with MRI brain, likely from multiple comorbidities UTI deconditioning.  Nonfocal.  Continue supportive care delirium precaution PT OT and will need a skilled nursing facility  Pancytopenia with leukopenia thrombocytopenia and anemia: Likely due to her chemotherapy.  WBC count improving hemoglobin remains a stable  Platelets in 116k Recent Labs  Lab 05/04/20 0527 05/07/20 0539 05/08/20 0539  HGB 8.1* 8.5* 8.1*  HCT 25.3* 26.7* 24.7*  WBC 3.7* 1.6* 1.9*  PLT 94* 125* 116*   Anemia of chronic Disease with underlying malignancy monitor H&H transfuse less than 7 g. Recent Labs  Lab 05/02/20 1524 05/03/20 0609 05/04/20 0527 05/07/20 0539 05/08/20 0539  HGB 9.3* 7.9* 8.1* 8.5* 8.1*  HCT 28.8* 24.3* 25.3* 26.7* 24.7*   Hypokalemia: Add potassium chloride while on diuretics  Hypertension controlled on losartan. Hypoalbuminemia with leg edema.Elevate leg.Augment nutrition.  Diet Order  Diet full liquid Room service  appropriate? Yes; Fluid consistency: Thin  Diet effective now                 Nutrition Problem: Moderate Malnutrition Etiology: chronic illness,cancer and cancer related treatments Signs/Symptoms: percent weight loss,energy intake < or equal to 75% for > or equal to 1 month,mild fat depletion,mild muscle depletion Interventions: Boost Breeze,Refer to RD note for recommendations Patient's Body mass index is 26.88 kg/m.  DVT prophylaxis: Place TED hose Start: 05/02/20 1941 Code Status:   Code Status: DNR  Family Communication: plan of care discussed with patient at bedside. Discussed with palliative care and oncology team.  Status is: Inpatient Remains inpatient appropriate because:IV treatments appropriate due to intensity of illness or inability to take PO and Inpatient level of care appropriate due to severity of illness  Dispo: The patient is from: Home              Anticipated d/c is to: SNF  W/ PALLIATIVE CARE over the weekend.              Patient currently is not medically stable to d/c.   Difficult to place patient No    Unresulted Labs (From admission, onward)          Start     Ordered   05/08/20 0500  Comprehensive metabolic panel  Daily,   R     Question:  Specimen collection method  Answer:  Lab=Lab collect   05/07/20 0852   05/08/20 0500  CBC  Daily,   R     Question:  Specimen collection method  Answer:  Lab=Lab collect   05/07/20 0852   05/08/20 0500  Cancer antigen 27.29  Tomorrow morning,   R        05/07/20 0950   05/08/20 0500  CEA  Tomorrow morning,   R        05/07/20 0950   05/07/20 0500  CBC  Daily,   R      05/06/20 1147          Medications reviewed:  Scheduled Meds: . anastrozole  1 mg Oral Daily  . Chlorhexidine Gluconate Cloth  6 each Topical Daily  . dexamethasone  4 mg Intravenous Q12H  . feeding supplement  1 Container Oral TID BM  . furosemide  20 mg Intravenous Q12H  . losartan  12.5 mg Oral Daily  . nitrofurantoin  (macrocrystal-monohydrate)  100 mg Oral Q12H  . ondansetron (ZOFRAN) IV  4 mg Intravenous Q8H  . pantoprazole (PROTONIX) IV  40 mg Intravenous Q24H  . polyethylene glycol  17 g Oral Daily  . potassium chloride  40 mEq Oral Once   Continuous Infusions:   Consultants:see note  Procedures:see note  Antimicrobials: Anti-infectives (From admission, onward)   Start     Dose/Rate Route Frequency Ordered Stop   05/06/20 1300  nitrofurantoin (macrocrystal-monohydrate) (MACROBID) capsule 100 mg        100 mg Oral Every 12 hours 05/06/20 1209 05/09/20 0959   05/02/20 1800  ampicillin (OMNIPEN) 1 g in sodium chloride 0.9 % 100 mL IVPB  Status:  Discontinued        1 g 300 mL/hr over 20 Minutes Intravenous Every 6 hours 05/02/20 1722 05/06/20 0955     Culture/Microbiology    Component Value Date/Time   SDES  04/30/2020 1300    URINE, CLEAN CATCH Performed at [facility], [place] [address], [place], [place] [address]  SPECREQUEST  04/30/2020 1300    NONE Performed at [facility], [place] [address], [place], [place] [address]    CULT 20,000 COLONIES/mL ENTEROCOCCUS FAECALIS (A) 04/30/2020 1300   REPTSTATUS 05/02/2020 FINAL 04/30/2020 1300    Other culture-see note  Objective: Vitals: Today's Vitals   05/07/20 1401 05/07/20 2101 05/07/20 2142 05/08/20 0606  BP: 107/65 (!) 151/65  (!) 143/62  Pulse: (!) 103 100  99  Resp: 16 16    Temp: 98 F (36.7 C) 97.9 F (36.6 C)  98 F (36.7 C)  TempSrc: Oral Oral  Oral  SpO2: 97% 91%  (!) 85%  Weight:      Height:      PainSc: 0-No pain  0-No pain     Intake/Output Summary (Last 24 hours) at 05/08/2020 0810 Last data filed at 05/08/2020 0547 Gross per 24 hour  Intake 758 ml  Output 3600 ml  Net -2842 ml   Filed Weights   05/02/20 1410  Weight: 82.6 kg   Weight change:   Intake/Output from previous day: 03/10 0701 - 03/11 0700 In: 758 [P.O.:758] Out: 3600  [Urine:3600] Intake/Output this shift: No intake/output data recorded. Filed Weights   05/02/20 1410  Weight: 82.6 kg    Examination: General exam: AAO to self, place not to date or current president HEENT:Oral mucosa moist, Ear/Nose WNL grossly, dentition normal. Respiratory system: bilaterally diminishedd,no wheezing or crackles,no use of accessory muscle Cardiovascular system: S1 & S2 +, No JVD,. Gastrointestinal system: Abdomen soft, NT,ND, BS+ Nervous System:Alert, awake, moving extremities and grossly nonfocal Extremities: b.l LE edema +++, distal peripheral pulses palpable.  Skin: No rashes,no icterus. MSK: Normal muscle bulk,tone, power  Data Reviewed: I have personally reviewed following labs and imaging studies CBC: Recent Labs  Lab 05/02/20 1524 05/03/20 0609 05/04/20 0527 05/07/20 0539 05/08/20 0539  WBC 4.6 3.8* 3.7* 1.6* 1.9*  HGB 9.3* 7.9* 8.1* 8.5* 8.1*  HCT 28.8* 24.3* 25.3* 26.7* 24.7*  MCV 90.0 90.3 92.0 91.1 89.8  PLT 129* 91* 94* 125* 902*   Basic Metabolic Panel: Recent Labs  Lab 05/02/20 1524 05/03/20 0609 05/04/20 0527 05/05/20 0459 05/07/20 0539 05/08/20 0539  NA 139 140 140 138 138 138  K 2.9* 3.5 3.4* 3.4* 3.9 3.4*  CL 103 106 105 105 105 102  CO2 20* 22 20* 20* 20* 22  GLUCOSE 103* 98 97 98 105* 112*  BUN 13 11 10 10 11 13   CREATININE 0.78 0.67 0.63 0.54 0.67 0.61  CALCIUM 7.9* 7.5* 7.6* 7.9* 8.7* 8.3*  MG 2.1  --   --   --   --   --   PHOS 3.0  --   --   --   --   --    GFR: Estimated Creatinine Clearance: 76.3 mL/min (by C-G formula based on SCr of 0.61 mg/dL). Liver Function Tests: Recent Labs  Lab 05/02/20 1524 05/03/20 0609 05/04/20 0527 05/08/20 0539  AST 129* 107* 103* 107*  ALT 43 37 39 45*  ALKPHOS 194* 148* 169* 188*  BILITOT 2.6* 2.5*  2.4* 1.9* 2.2*  PROT 5.4* 4.6* 4.5* 4.5*  ALBUMIN 3.1* 2.6* 2.6* 2.5*   Recent Labs  Lab 05/02/20 1524  LIPASE 69*   No results for input(s): AMMONIA in the last 168  hours. Coagulation Profile: No results for input(s): INR, PROTIME in the last 168 hours. Cardiac Enzymes: No results for input(s): CKTOTAL, CKMB, CKMBINDEX, TROPONINI in the last 168 hours. BNP (last 3 results) No  results for input(s): PROBNP in the last 8760 hours. HbA1C: No results for input(s): HGBA1C in the last 72 hours. CBG: No results for input(s): GLUCAP in the last 168 hours. Lipid Profile: No results for input(s): CHOL, HDL, LDLCALC, TRIG, CHOLHDL, LDLDIRECT in the last 72 hours. Thyroid Function Tests: No results for input(s): TSH, T4TOTAL, FREET4, T3FREE, THYROIDAB in the last 72 hours. Anemia Panel: No results for input(s): VITAMINB12, FOLATE, FERRITIN, TIBC, IRON, RETICCTPCT in the last 72 hours. Sepsis Labs: Recent Labs  Lab 05/02/20 1524 05/03/20 1400  LATICACIDVEN 4.2* 2.9*    Recent Results (from the past 240 hour(s))  Culture, Urine     Status: Abnormal   Collection Time: 04/30/20  1:00 PM   Specimen: Urine, Clean Catch  Result Value Ref Range Status   Specimen Description   Final    URINE, CLEAN CATCH Performed at [facility], [place] [address], [place], [place] [address]    Special Requests   Final    NONE Performed at [facility], [place] [address], [place], [place] [address]    Culture 20,000 COLONIES/mL ENTEROCOCCUS FAECALIS (A)  Final   Report Status 05/02/2020 FINAL  Final   Organism ID, Bacteria ENTEROCOCCUS FAECALIS (A)  Final      Susceptibility   Enterococcus faecalis - MIC*    AMPICILLIN <=2 SENSITIVE Sensitive     NITROFURANTOIN <=16 SENSITIVE Sensitive     VANCOMYCIN 2 SENSITIVE Sensitive     * 20,000 COLONIES/mL ENTEROCOCCUS FAECALIS  SARS CORONAVIRUS 2 (TAT 6-24 HRS) Nasopharyngeal Nasopharyngeal Swab     Status: None   Collection Time: 05/02/20  7:15 PM   Specimen: Nasopharyngeal Swab  Result Value Ref Range Status   SARS Coronavirus 2 NEGATIVE NEGATIVE Final    Comment:  (NOTE) SARS-CoV-2 target nucleic acids are NOT DETECTED.  The SARS-CoV-2 RNA is generally detectable in upper and lower respiratory specimens during the acute phase of infection. Negative results do not preclude SARS-CoV-2 infection, do not rule out co-infections with other pathogens, and should not be used as the sole basis for treatment or other patient management decisions. Negative results must be combined with clinical observations, patient history, and epidemiological information. The expected result is Negative.  Fact Sheet for Patients: SugarRoll.be  Fact Sheet for Healthcare Providers: https://www.woods-mathews.com/  This test is not yet approved or cleared by the [place] FDA and  has been authorized for detection and/or diagnosis of SARS-CoV-2 by FDA under an Emergency Use Authorization (EUA). This EUA will remain  in effect (meaning this test can be used) for the duration of the COVID-19 declaration under Se ction 564(b)(1) of the Act, 21 U.S.C. section 360bbb-3(b)(1), unless the authorization is terminated or revoked sooner.  Performed at [facility], [place] [address], [place], [place] [address]      Radiology Studies: MR BRAIN W WO CONTRAST  Result Date: 05/06/2020 CLINICAL DATA:  Staging of intracranial meningeal carcinomatosis. EXAM: MRI HEAD WITHOUT AND WITH CONTRAST TECHNIQUE: Multiplanar, multiecho pulse sequences of the brain and surrounding structures were obtained without and with intravenous contrast. CONTRAST:  52mL GADAVIST GADOBUTROL 1 MMOL/ML IV SOLN COMPARISON:  03/26/2020 FINDINGS: Brain: No acute infarct, mass effect or extra-axial collection. No acute or chronic hemorrhage. 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 The midline structures are normal. There is unchanged dural thickening, right greater than left. The thickest portion is at the anterior right convexity, measuring 6 mm, unchanged. There is a 7  mm extra-axial mass  within the left posterior fossa that is unchanged (17:7. A multilobular inter hemispheric lesion measures 3.6 x 1.0 cm, unchanged. Vascular: Major flow voids are preserved. Skull and upper cervical spine: Generally low T1-weighted signal throughout the calvarium Sinuses/Orbits:No paranasal sinus fluid levels or advanced mucosal thickening. No mastoid or middle ear effusion. Normal orbits. Other: Unchanged appearance of numerous subcutaneous lesions. IMPRESSION: 1. Unchanged right-greater-than-left dural thickening, which is greatest at the anterior right convexity. 2. Unchanged inter hemispheric extra-axial mass, likely metastatic disease. 3. Unchanged 7 mm extra-axial lesion of the left posterior fossa, which may be a meningioma or a focus of metastatic disease. Electronically Signed   By: Ulyses Jarred M.D.   On: 05/06/2020 22:44   ECHOCARDIOGRAM COMPLETE  Result Date: 05/07/2020    ECHOCARDIOGRAM REPORT   Patient Name:   CIANI RUTTEN Date of Exam: 05/07/2020 Medical Rec #:  [id number]      Height:       69.0 in Accession #:    [id number]     Weight:       182.0 lb Date of Birth:  January 10, 1951     BSA:          1.985 m Patient Age:    7 years       BP:           107/65 mmHg Patient Gender: F              HR:           103 bpm. Exam Location:  Inpatient Procedure: 2D Echo, Cardiac Doppler and Color Doppler Indications:    CHF  History:        Patient has no prior history of Echocardiogram examinations.                 Risk Factors:Hypertension and Dyslipidemia. Breast cancer,                 chemo, radiation.  Sonographer:    Dustin Flock Referring Phys: [id number] [place] IMPRESSIONS  1. Left ventricular ejection fraction, by estimation, is 60 to 65%. The left ventricle has normal function. The left ventricle has no regional wall motion abnormalities. Left ventricular diastolic parameters were normal.  2. Right ventricular systolic function is normal. The right ventricular size is  normal. There is mildly elevated pulmonary artery systolic pressure.  3. The mitral valve is normal in structure. Trivial mitral valve regurgitation. No evidence of mitral stenosis.  4. The aortic valve is tricuspid. Aortic valve regurgitation is not visualized. No aortic stenosis is present.  5. The inferior vena cava is normal in size with greater than 50% respiratory variability, suggesting right atrial pressure of 3 mmHg. Comparison(s): No prior Echocardiogram. Conclusion(s)/Recommendation(s): Normal biventricular function without evidence of hemodynamically significant valvular heart disease. FINDINGS  Left Ventricle: Left ventricular ejection fraction, by estimation, is 60 to 65%. The left ventricle has normal function. The left ventricle has no regional wall motion abnormalities. The left ventricular internal cavity size was normal in size. There is  no left ventricular hypertrophy. Left ventricular diastolic parameters were normal. Right Ventricle: The right ventricular size is normal. No increase in right ventricular wall thickness. Right ventricular systolic function is normal. There is mildly elevated pulmonary artery systolic pressure. The tricuspid regurgitant velocity is 2.97  m/s, and with an assumed right atrial pressure of 3 mmHg, the estimated right ventricular systolic pressure is 89.3 mmHg. Left Atrium: Left atrial size was normal in size. Right Atrium: Right atrial  size was normal in size. Pericardium: There is no evidence of pericardial effusion. Mitral Valve: The mitral valve is normal in structure. Trivial mitral valve regurgitation. No evidence of mitral valve stenosis. Tricuspid Valve: The tricuspid valve is normal in structure. Tricuspid valve regurgitation is trivial. No evidence of tricuspid stenosis. Aortic Valve: The aortic valve is tricuspid. Aortic valve regurgitation is not visualized. No aortic stenosis is present. Aortic valve peak gradient measures 10.8 mmHg. Pulmonic Valve: The  pulmonic valve was not well visualized. Pulmonic valve regurgitation is not visualized. No evidence of pulmonic stenosis. Aorta: The aortic root, ascending aorta, aortic arch and descending aorta are all structurally normal, with no evidence of dilitation or obstruction. Venous: The inferior vena cava is normal in size with greater than 50% respiratory variability, suggesting right atrial pressure of 3 mmHg. IAS/Shunts: The atrial septum is grossly normal.  LEFT VENTRICLE PLAX 2D LVIDd:         4.80 cm  Diastology LVIDs:         2.60 cm  LV e' medial:    7.83 cm/s LV PW:         1.00 cm  LV E/e' medial:  10.0 LV IVS:        1.10 cm  LV e' lateral:   14.30 cm/s LVOT diam:     2.00 cm  LV E/e' lateral: 5.5 LV SV:         79 LV SV Index:   40 LVOT Area:     3.14 cm  RIGHT VENTRICLE RV Basal diam:  3.20 cm RV S prime:     12.30 cm/s TAPSE (M-mode): 1.9 cm LEFT ATRIUM             Index       RIGHT ATRIUM           Index LA diam:        2.70 cm 1.36 cm/m  RA Area:     12.60 cm LA Vol (A2C):   30.2 ml 15.22 ml/m RA Volume:   28.10 ml  14.16 ml/m LA Vol (A4C):   37.1 ml 18.69 ml/m LA Biplane Vol: 33.8 ml 17.03 ml/m  AORTIC VALVE AV Area (Vmax): 2.45 cm AV Vmax:        164.00 cm/s AV Peak Grad:   10.8 mmHg LVOT Vmax:      128.00 cm/s LVOT Vmean:     77.000 cm/s LVOT VTI:       0.253 m  AORTA Ao Root diam: 3.00 cm MITRAL VALVE               TRICUSPID VALVE MV Area (PHT): 5.38 cm    TR Peak grad:   35.3 mmHg MV Decel Time: 141 msec    TR Vmax:        297.00 cm/s MV E velocity: 78.20 cm/s MV A velocity: 72.20 cm/s  SHUNTS MV E/A ratio:  1.08        Systemic VTI:  0.25 m                            Systemic Diam: 2.00 cm Buford Dresser MD Electronically signed by Buford Dresser MD Signature Date/Time: 05/07/2020/3:47:58 PM    Final      LOS: 5 days   Antonieta Pert, MD [facility]  05/08/2020, 8:10 AM

## 2020-05-08 NOTE — Telephone Encounter (Addendum)
Oral Oncology Pharmacist Encounter  Received new prescription for Ibrance (palbociclib) for the treatment of metastatic, HR positive, HER-2 negative breast cancer in conjunction with anastrozole, planned duration until disease progression or unacceptable drug toxicity.  Prescription dose and frequency assessed for appropriateness. Given concerns for patient tolerance and patient's baseline cytopenias, Dr. [name] OK with decreasing patient's starting dose of Ibrance to 75 mg by mouth daily for 21 days on, 7 days off, repeat every 28 days.  CMP and CBC from 05/08/20 assessed, patient still with WBC of 1.9 K/uL (up from 1.6 K/uL), Hgb 8.1 g/dL, pltc down to 116 K/uL from 125 K/uL. Patient starting on upfront dose reduction to decrease risk of hematologic toxicity with full dose Ibrance. CBC notable for LFT elevation - recommend close monitoring for ADEs given Leslee Home is extensively hepatically metabolized.   Current medication list in Epic reviewed, DDIs with Ibrance identified:  Category D DDI between Ibrance and pantoprazole - proton-pump inhibitors can decrease efficacy of Ibrance (more so noted with capsule formulation of Ibrance) concomitant use not recommended - will discuss with patient alternatives to pantoprazole, such as H2RA's like famotidine while on Ibrance.  Evaluated chart and no patient barriers to medication adherence noted.   Prescription has been e-scribed to the [facility] for benefits analysis and approval.  Oral Oncology Clinic will continue to follow for insurance authorization, copayment issues, initial counseling and start date.  Leron Croak, PharmD, BCPS Hematology/Oncology Clinical Pharmacist [facility] [phone] 05/08/2020 8:16 AM

## 2020-05-08 NOTE — Progress Notes (Signed)
Physical Therapy Treatment Patient Details Name: Brooke [name] MRN: [id number] DOB: Jul 21, 1950 Today's Date: 05/08/2020    History of Present Illness 70 year old female with history of metastatic breast cancer, hypertension, anxiety presented with intractable nausea vomiting and generalized weakness. Dx of UTI.    PT Comments    Pt looks better (decreased edema throughout).  General Comments: Pt AxO x 2 following repeat instructions but for most part is flat with minimal egagement and lack of interest.  General bed mobility comments: max A to bring LEs off EOB and upright trunk to sitting.  VERY weak.  Poor collapsed sitting posture.General transfer comment: pt required + 2 assist from elevated bed to stand in steady.  Third assist was pulling twisted bed sheet that was placed around pt's low back to assist with weight shift from posterior to anterior.  Partial stance with poor trunk/head control with brief ability to partially stand upright but then quickly back to collapsed.  was able to close stedy flaps and roll pt to recliner.  Same fashion sit to stamd using twisted sheet to pull pt's weight forward off flaps.  Instructed pt to stand for duration < 30 sec.  Control assist to recliner. While seated edge of recliner pt's back unsupported she performed some B LE TE's.   10 reps x 2 LAQ's "kicks" AROM 10 reps of hip flex (knee ups) AROM 10 reps alternating marching AROM 10 reps B UE's "cross punches" 10 reps B UE "forward punches " B UE weakness unable to raise hands above shoulders Static rest breaks between and VC's to increase upright posture esp head extension.  Posiitoned to comfort with multiple pillows. Pt plans to D/C to SNF for ST Rehab ideal to start with  mat table level activities and parallel bars  Follow Up Recommendations  SNF     Equipment Recommendations  Wheelchair cushion (measurements PT);Wheelchair (measurements PT)    Recommendations for Other Services        Precautions / Restrictions Precautions Precautions: Fall Precaution Comments: SIRS Restrictions Weight Bearing Restrictions: No    Mobility  Bed Mobility Overal bed mobility: Needs Assistance Bed Mobility: Supine to Sit     Supine to sit: Max assist;Total assist;+2 for safety/equipment     General bed mobility comments: max A to bring LEs off EOB and upright trunk to sitting.  VERY weak.  Poor collapsed sitting posture.    Transfers Overall transfer level: Needs assistance   Transfers: Sit to/from Stand Sit to Stand: Max assist;+2 physical assistance;+2 safety/equipment;From elevated surface         General transfer comment: pt required + 2 assist from elevated bed to stand in steady.  Third assist was pulling twisted bed sheet that was placed around pt's low back to assist with weight shift from posterior to anterior.  Partial stance with poor trunk/head control with brief ability to partially stand upright but then quickly back to collapsed.  was able to close stedy flaps and roll pt to recliner.  Same fashion sit to stamd using twisted sheet to pull pt's weight forward off flaps.  Instructed pt to stand for duration < 30 sec.  Control assist to recliner and posiitoned to comfort with multiple pillows.  Ambulation/Gait             General Gait Details: non amb due to profound weakness   Stairs             Wheelchair Mobility    Modified Rankin (Stroke Patients Only)  Balance                                            Cognition Arousal/Alertness: Awake/alert Behavior During Therapy: Flat affect Overall Cognitive Status: No family/caregiver present to determine baseline cognitive functioning                                 General Comments: Pt AxO x 2 following repeat instructions but for most part is flat with minimal egagement and lack of interest      Exercises      General Comments        Pertinent  Vitals/Pain Pain Assessment: Faces Faces Pain Scale: Hurts little more Pain Location: back Pain Descriptors / Indicators: Aching;Grimacing Pain Intervention(s): Monitored during session;Repositioned    Home Living                      Prior Function            PT Goals (current goals can now be found in the care plan section) Progress towards PT goals: Progressing toward goals    Frequency    Min 3X/week      PT Plan Current plan remains appropriate    Co-evaluation              AM-PAC PT "6 Clicks" Mobility   Outcome Measure  Help needed turning from your back to your side while in a flat bed without using bedrails?: Total Help needed moving from lying on your back to sitting on the side of a flat bed without using bedrails?: Total Help needed moving to and from a bed to a chair (including a wheelchair)?: Total Help needed standing up from a chair using your arms (e.g., wheelchair or bedside chair)?: Total Help needed to walk in hospital room?: Total   6 Click Score: 5    End of Session Equipment Utilized During Treatment: Gait belt Activity Tolerance: Patient limited by fatigue Patient left: in chair;with call bell/phone within reach Nurse Communication: Mobility status PT Visit Diagnosis: Difficulty in walking, not elsewhere classified (R26.2);Muscle weakness (generalized) (M62.81)     Time: 2130-8657 PT Time Calculation (min) (ACUTE ONLY): 32 min  Charges:  $Therapeutic Exercise: 8-22 mins $Therapeutic Activity: 8-22 mins                     Rica Koyanagi  PTA Acute  Rehabilitation Services Pager      [phone] Office      [phone]

## 2020-05-08 NOTE — Care Plan (Signed)
In discussion with Dr. Myriam Jacobson, adding on some nutritional labs (copper, zinc, B12, thiamine, B6).  We will also empirically supplement with thiamine pending results (500 mg IV every 8 hours x3 days followed by 100 mg IV daily, to be transitioned to 100 mg p.o. daily on discharge).

## 2020-05-08 NOTE — TOC Progression Note (Signed)
Transition of Care [facility]) - Progression Note    Patient Details  Name: ALLISYN KUNZ MRN: [id number] Date of Birth: 02/28/1951  Transition of [facility] CM/SW Contact  Hamda Klutts, Marjie Skiff, RN Phone Number: 05/08/2020, 3:34 PM  Clinical Narrative:  [facility] liaison spoke with pt friend Leda Gauze about admission paperwork. Leda Gauze states she is not comfortable filling this out for pt. Contacted sister [name] and sister Collie Siad to anticipate needing to help with admission paperwork at time of dc. [facility] liaison given phone numbers of both sisters. Sue's number is [phone].  Expected Discharge Plan: Skilled Nursing Facility Barriers to Discharge: Continued Medical Work up  Expected Discharge Plan and Services Expected Discharge Plan: [place]      Social Determinants of Health (SDOH) Interventions    Readmission Risk Interventions Readmission Risk Prevention Plan 05/04/2020  Transportation Screening Complete  PCP or Specialist Appt within 3-5 Days Complete  HRI or [place] Complete  Social Work Consult for [place] Planning/Counseling Complete  Palliative Care Screening Complete  Medication Review [employer]) Complete  Some recent data might be hidden

## 2020-05-08 NOTE — Progress Notes (Signed)
Patient extremely confused today. She is unable to swallow her medicine and keeps spitting it up. Discussed with doctor and he has put in a speech and swallow evaluation. Patient is also day 4 for foley, and will keep one more day due to receiving lasix.

## 2020-05-08 NOTE — Progress Notes (Signed)
Attempted to give medicine again crushed in applesauce. Patient still not swallowing. She attempted to eat the applesauce with a straw. Tried to direct patient but was unsuccessful.

## 2020-05-08 NOTE — Consult Note (Signed)
NEURO HOSPITALIST CONSULT NOTE   Requestig physician: Dr. Lupita Leash  Reason for Consult: Diffuse weakness in the context of breast cancer with dural metastatic lesions seen on brain and spine MRI  History obtained from:  Chart     HPI:                                                                                                                                          Brooke [name] is an 70 y.o. female with breast cancer who presented to [facility] on 3/5 with diffuse weakness.   Due to her encephalopathy, the patient is unable to provide a coherent PMHx. HPI from the chart was reviewed. Per Dr. Merilyn Baba admission H and P: "Brooke [name] is a 70 y.o. female with medical history significant of metastatic breast cancer, HTN, anxiety. Presenting with intractable N/V and generalized weakness. Admitted for similar presentation 2 weeks ago. As then, she has had N/V and poor appetite for months. She has continue chemotherapy with Dr. [name]. She had a session this week per her report. She continues to do poorly overall. She reports that she went for a PICC care session yesterday at the cancer center. After she got home, she rested. However, is was unable to get up from her chair. She was stuck there until someone came to help her this morning. They noted that she was extremely weak. She was concerned and came back to the ED. Of note, she was diagnosed with a UTI by her oncologist a couple of days ago. An abx Rx was called in but she was unable to pick it up."   MRI of the cervical, thoracic and lumbar spine with and without contrast revealed diffuse osseous metastatic disease without pathologic fracture and 2 small foci of abnormal contrast enhancement along the left dorsal aspect of the distal spinal cord at the T12 level, likely secondary to CSF disseminated metastatic disease. No spinal canal stenosis was seen.   MRI of brain with and without contrast revealed diffuse thickening of portions of  the dura mater as well as focal nodular lesions, which are unchanged relative to the prior MRI from 03/26/20.  In the ED she was noted to have nausea. She was hypokalemic which was treated with potassium and fluids. During her stay she has continued to be diffusely weak. On bedside interview early this morning (Friday), she states that she feels weak all over, in BUE and BLE, but her right side feels somewhat weaker to her that her left. She also states that her arms are weaker than her legs.     MRI brain with and without contrast (05/06/20):  1. Unchanged right-greater-than-left dural thickening, which is greatest at the anterior right convexity. 2. Unchanged inter hemispheric extra-axial mass, likely metastatic disease.  3. Unchanged 7 mm extra-axial lesion of the left posterior fossa, which may be a meningioma or a focus of metastatic disease.  MRI of cervical, thoracic and lumbar spine (05/04/20):  1. Diffuse osseous metastatic disease without pathologic fracture. 2. 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 3. No spinal canal stenosis.   Past Medical History:  Diagnosis Date  . Anemia   . Anxiety   . Arthritis   . ASCUS of cervix with negative high risk HPV 12/2018  . Bilateral dry eyes   . Breast cancer, left breast ([place])   . Family history of breast cancer 02/02/2017  . Family history of breast cancer   . Family history of colon cancer 02/02/2017  . Family history of colon cancer   . Family history of prostate cancer   . Family history of prostate cancer in father 02/02/2017  . Family history of uterine cancer   . Fibroid   . History of kidney stones   . Hyperlipidemia   . Hypertension   . Melanoma of upper arm ([place]) 2009   RIGHT ARM   . Obesity (BMI 30.0-34.9)   . Osteopenia 09/2017   T score -1.9 max 8% / 0.8% statistically significant decline at right  and left hip stable at the spine  . PONV (postoperative nausea and vomiting)   . Squamous carcinoma     of the skin  . UTI (urinary tract infection)   . Varicose veins of both lower extremities   . Vitamin D deficiency 07/2009   LOW VITAMIN D 29  . Vitamin deficiency 07/2008   VITAMIN D LOW 23    Past Surgical History:  Procedure Laterality Date  . ABDOMINAL SURGERY  1975   ovairan cyst   . APPENDECTOMY    . BREAST LUMPECTOMY WITH RADIOACTIVE SEED LOCALIZATION Right 09/25/2017   Procedure: RIGHT BREAST LUMPECTOMY WITH RADIOACTIVE SEED LOCALIZATION;  Surgeon: Jovita Kussmaul, MD;  Location: [place];  Service: General;  Laterality: Right;  . BREAST SURGERY     bio  . CHOLECYSTECTOMY    . DERMOID CYST  EXCISION    . DILATATION & CURETTAGE/HYSTEROSCOPY WITH MYOSURE N/A 04/23/2019   Procedure: DILATATION & CURETTAGE/HYSTEROSCOPY WITH MYOSURE;  Surgeon: [name], MD;  Location: [place];  Service: Gynecology;  Laterality: N/A;  request to follow at 9:30am in [facility] block requests 45 minutes  . DILATION AND CURETTAGE OF UTERUS  1998  . GALLBLADDER SURGERY    . HYSTEROSCOPY    . insertion  port a cath  03/02/2016  . MASTECTOMY MODIFIED RADICAL Left 09/25/2017   Procedure: LEFT MODIFIED RADICAL MASTECTOMY;  Surgeon: Jovita Kussmaul, MD;  Location: [place];  Service: General;  Laterality: Left;  . PORTACATH PLACEMENT N/A 03/02/2017   Procedure: INSERTION PORT-A-CATH;  Surgeon: Jovita Kussmaul, MD;  Location: [place];  Service: General;  Laterality: N/A;  . SKIN BIOPSY    . SKIN CANCER EXCISION  2010,2011   2010-RIGHT ARM, 2011-LEFT LOWER LEG.-DR Rolm Bookbinder DERM. DR. Sarajane Jews IS HER DERM SURGEON.    Family History  Problem Relation Age of Onset  . Diabetes Father   . Hypertension Father   . Heart disease Father   . Prostate cancer Father 21  . Arthritis Father   . Breast cancer Sister 8       again at age 71, GT results unk  .  Skin cancer Sister   . Colon  cancer Paternal Grandfather 35  . Hypertension Mother   . Heart failure Mother   . Uterine cancer Sister 63       stage IV  . Breast cancer Cousin        mat first cousin dx under 36              Social History:  reports that she has never smoked. She has never used smokeless tobacco. She reports that she does not drink alcohol and does not use drugs.  Allergies  Allergen Reactions  . Neosporin [Neomycin-Bacitracin Zn-Polymyx] Swelling, Rash and Other (See Comments)    Blisters  . Bactrim [Sulfamethoxazole-Trimethoprim]     Made her feel very sick  . Benzalkonium Chloride Other (See Comments)    Redness (Pt is unaware)  It works by killing microorganisms and inhibiting their future growth, and for this reason frequently appears as an ingredient in antibacterial hand wipes, antiseptic creams and anti-itch ointments.  . Latex Rash    MEDICATIONS:                                                                                                                     Scheduled: . anastrozole  1 mg Oral Daily  . Chlorhexidine Gluconate Cloth  6 each Topical Daily  . dexamethasone  4 mg Intravenous Q12H  . feeding supplement  1 Container Oral TID BM  . furosemide  20 mg Intravenous Q12H  . losartan  12.5 mg Oral Daily  . nitrofurantoin (macrocrystal-monohydrate)  100 mg Oral Q12H  . ondansetron (ZOFRAN) IV  4 mg Intravenous Q8H  . pantoprazole (PROTONIX) IV  40 mg Intravenous Q24H  . polyethylene glycol  17 g Oral Daily     ROS:                                                                                                                                       As per HPI. The patient is unable to provide a comprehensive ROS due to AMS.    Blood pressure (!) 151/65, pulse 100, temperature 97.9 F (36.6 C), temperature source Oral, resp. rate 16, height 5\' 9"  (1.753 m), weight 82.6 kg, last menstrual period 03/18/1997, SpO2 91 %.   General Examination:  Physical Exam  HEENT-  [place]/AT    Lungs- Respirations unlabored Extremities- Pitting edema to BLE is noted.   Neurological Examination Mental Status: Awake with decreased level of alertness. Poor attention. Poorly oriented [facility] "[place]", "Monday" and "February" with year stated correctly). She has increased latencies of verbal and motor responses. Speech is fluent. Able to comprehend all commands. Able to name a pinky, thumb and a pen, but cannot identify an index finger by name.  Cranial Nerves: II: Temporal visual fields intact with no extinction to DSS. PERRL.   III,IV, VI: EOMI and conjugate. No nystagmus.  V,VII: Temp sensation equal bilaterally. Smile symmetric VIII: Hearing intact to voice IX,X: No hypophonia XI: Head is midline XII: Midline tongue extension Motor: BUE 4-/5 proximally and distally.  BLE: 2/5 hip flexion, 4-/5 knee flexion, 3/5 knee extension. Able to wiggle toes vigorously.  Sensory: Temp sensation intact proximally x 4. FT intact. No extinction to DSS.  Deep Tendon Reflexes: 1+ bilateral brachioradialis and biceps. 0 bilateral patellae and achilles.  Plantars: Upgoing bilaterally Cerebellar: Slow FNF bilaterally, but without ataxia.  Gait: Unable to assess   Lab Results: Basic Metabolic Panel: Recent Labs  Lab 05/02/20 1524 05/03/20 0609 05/04/20 0527 05/05/20 0459 05/07/20 0539  NA 139 140 140 138 138  K 2.9* 3.5 3.4* 3.4* 3.9  CL 103 106 105 105 105  CO2 20* 22 20* 20* 20*  GLUCOSE 103* 98 97 98 105*  BUN 13 11 10 10 11   CREATININE 0.78 0.67 0.63 0.54 0.67  CALCIUM 7.9* 7.5* 7.6* 7.9* 8.7*  MG 2.1  --   --   --   --   PHOS 3.0  --   --   --   --     CBC: Recent Labs  Lab 05/02/20 1524 05/03/20 0609 05/04/20 0527 05/07/20 0539  WBC 4.6 3.8* 3.7* 1.6*  HGB 9.3* 7.9* 8.1* 8.5*  HCT 28.8* 24.3* 25.3* 26.7*  MCV 90.0 90.3 92.0 91.1  PLT 129* 91* 94* 125*     Cardiac Enzymes: No results for input(s): CKTOTAL, CKMB, CKMBINDEX, TROPONINI in the last 168 hours.  Lipid Panel: No results for input(s): CHOL, TRIG, HDL, CHOLHDL, VLDL, LDLCALC in the last 168 hours.  Imaging: MR BRAIN W WO CONTRAST  Result Date: 05/06/2020 CLINICAL DATA:  Staging of intracranial meningeal carcinomatosis. EXAM: MRI HEAD WITHOUT AND WITH CONTRAST TECHNIQUE: Multiplanar, multiecho pulse sequences of the brain and surrounding structures were obtained without and with intravenous contrast. CONTRAST:  77mL GADAVIST GADOBUTROL 1 MMOL/ML IV SOLN COMPARISON:  03/26/2020 FINDINGS: Brain: No acute infarct, mass effect or extra-axial collection. No acute or chronic hemorrhage. 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 The midline structures are normal. There is unchanged dural thickening, right greater than left. The thickest portion is at the anterior right convexity, measuring 6 mm, unchanged. There is a 7 mm extra-axial mass within the left posterior fossa that is unchanged (17:7. A multilobular inter hemispheric lesion measures 3.6 x 1.0 cm, unchanged. Vascular: Major flow voids are preserved. Skull and upper cervical spine: Generally low T1-weighted signal throughout the calvarium Sinuses/Orbits:No paranasal sinus fluid levels or advanced mucosal thickening. No mastoid or middle ear effusion. Normal orbits. Other: Unchanged appearance of numerous subcutaneous lesions. IMPRESSION: 1. Unchanged right-greater-than-left dural thickening, which is greatest at the anterior right convexity. 2. Unchanged inter hemispheric extra-axial mass, likely metastatic disease. 3. Unchanged 7 mm extra-axial lesion of the left posterior fossa, which may be a meningioma or a focus of metastatic disease. Electronically Signed  By: Ulyses Jarred M.D.   On: 05/06/2020 22:44   ECHOCARDIOGRAM COMPLETE  Result Date: 05/07/2020    ECHOCARDIOGRAM REPORT   Patient Name:   DORTHIE SANTINI Date of Exam: 05/07/2020  Medical Rec #:  [id number]      Height:       69.0 in Accession #:    [id number]     Weight:       182.0 lb Date of Birth:  17-Jan-1951     BSA:          1.985 m Patient Age:    21 years       BP:           107/65 mmHg Patient Gender: F              HR:           103 bpm. Exam Location:  Inpatient Procedure: 2D Echo, Cardiac Doppler and Color Doppler Indications:    CHF  History:        Patient has no prior history of Echocardiogram examinations.                 Risk Factors:Hypertension and Dyslipidemia. Breast cancer,                 chemo, radiation.  Sonographer:    Dustin Flock Referring Phys: [id number] [place] IMPRESSIONS  1. Left ventricular ejection fraction, by estimation, is 60 to 65%. The left ventricle has normal function. The left ventricle has no regional wall motion abnormalities. Left ventricular diastolic parameters were normal.  2. Right ventricular systolic function is normal. The right ventricular size is normal. There is mildly elevated pulmonary artery systolic pressure.  3. The mitral valve is normal in structure. Trivial mitral valve regurgitation. No evidence of mitral stenosis.  4. The aortic valve is tricuspid. Aortic valve regurgitation is not visualized. No aortic stenosis is present.  5. The inferior vena cava is normal in size with greater than 50% respiratory variability, suggesting right atrial pressure of 3 mmHg. Comparison(s): No prior Echocardiogram. Conclusion(s)/Recommendation(s): Normal biventricular function without evidence of hemodynamically significant valvular heart disease. FINDINGS  Left Ventricle: Left ventricular ejection fraction, by estimation, is 60 to 65%. The left ventricle has normal function. The left ventricle has no regional wall motion abnormalities. The left ventricular internal cavity size was normal in size. There is  no left ventricular hypertrophy. Left ventricular diastolic parameters were normal. Right Ventricle: The right ventricular size is  normal. No increase in right ventricular wall thickness. Right ventricular systolic function is normal. There is mildly elevated pulmonary artery systolic pressure. The tricuspid regurgitant velocity is 2.97  m/s, and with an assumed right atrial pressure of 3 mmHg, the estimated right ventricular systolic pressure is 56.4 mmHg. Left Atrium: Left atrial size was normal in size. Right Atrium: Right atrial size was normal in size. Pericardium: There is no evidence of pericardial effusion. Mitral Valve: The mitral valve is normal in structure. Trivial mitral valve regurgitation. No evidence of mitral valve stenosis. Tricuspid Valve: The tricuspid valve is normal in structure. Tricuspid valve regurgitation is trivial. No evidence of tricuspid stenosis. Aortic Valve: The aortic valve is tricuspid. Aortic valve regurgitation is not visualized. No aortic stenosis is present. Aortic valve peak gradient measures 10.8 mmHg. Pulmonic Valve: The pulmonic valve was not well visualized. Pulmonic valve regurgitation is not visualized. No evidence of pulmonic stenosis. Aorta: The aortic root, ascending aorta, aortic arch and descending aorta are all structurally  normal, with no evidence of dilitation or obstruction. Venous: The inferior vena cava is normal in size with greater than 50% respiratory variability, suggesting right atrial pressure of 3 mmHg. IAS/Shunts: The atrial septum is grossly normal.  LEFT VENTRICLE PLAX 2D LVIDd:         4.80 cm  Diastology LVIDs:         2.60 cm  LV e' medial:    7.83 cm/s LV PW:         1.00 cm  LV E/e' medial:  10.0 LV IVS:        1.10 cm  LV e' lateral:   14.30 cm/s LVOT diam:     2.00 cm  LV E/e' lateral: 5.5 LV SV:         79 LV SV Index:   40 LVOT Area:     3.14 cm  RIGHT VENTRICLE RV Basal diam:  3.20 cm RV S prime:     12.30 cm/s TAPSE (M-mode): 1.9 cm LEFT ATRIUM             Index       RIGHT ATRIUM           Index LA diam:        2.70 cm 1.36 cm/m  RA Area:     12.60 cm LA Vol  (A2C):   30.2 ml 15.22 ml/m RA Volume:   28.10 ml  14.16 ml/m LA Vol (A4C):   37.1 ml 18.69 ml/m LA Biplane Vol: 33.8 ml 17.03 ml/m  AORTIC VALVE AV Area (Vmax): 2.45 cm AV Vmax:        164.00 cm/s AV Peak Grad:   10.8 mmHg LVOT Vmax:      128.00 cm/s LVOT Vmean:     77.000 cm/s LVOT VTI:       0.253 m  AORTA Ao Root diam: 3.00 cm MITRAL VALVE               TRICUSPID VALVE MV Area (PHT): 5.38 cm    TR Peak grad:   35.3 mmHg MV Decel Time: 141 msec    TR Vmax:        297.00 cm/s MV E velocity: 78.20 cm/s MV A velocity: 72.20 cm/s  SHUNTS MV E/A ratio:  1.08        Systemic VTI:  0.25 m                            Systemic Diam: 2.00 cm Buford Dresser MD Electronically signed by Buford Dresser MD Signature Date/Time: 05/07/2020/3:47:58 PM    Final     Assessment: 70 year old female with breast cancer who presented to [facility] on 3/5 with diffuse weakness. 1. Exam reveals an encephalopathic patient with diffuse limb weakness in upper and lower extremities. No lateralized findings are appreciated.  2. MRI brain does not show evidence for leptomeningeal carcinomatosis. There is dural involvement focally as well as thickening, most likely secondary to metastatic breast cancer, which is stable since the prior MRI.  3. The enhancing lesions adjacent to the spinal cord on T-spine MRI do not appear to be associated with spinal cord compression and are also unchanged.  4. The patient's encephalopathy with diffuse weakness is felt most likely to be multifactorial, with her UTI, as well as deconditioning, dehydration and lactic acidosis from poor po intake being contributing factors. Her lower extremity weakness is likely compounded by the significant lower extremity edema  seen on exam. The lesions on her MRI scans do not provide an explanation. 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 She has upper  extremity reflexes, therefore GBS is unlikely.   Recommendations: 1. Treat UTI.  2. Management of her BLE edema. 3. Consider outpatient EMG/NCS.      Electronically signed: Dr. Kerney Elbe 05/08/2020, 1:07 AM

## 2020-05-08 NOTE — Progress Notes (Signed)
Daily Progress Note   Patient Name: Brooke [name]       Date: 05/08/2020 DOB: 24-Oct-1950  Age: 70 y.o. MRN#: [id number] Attending Physician: Antonieta Pert, MD Primary Care Physician: [name], MD Admit Date: 05/02/2020  Reason for Consultation/Follow-up: Establishing goals of care  Subjective: I saw and examined Brooke [name].  She is awake and alert, but she is confused in talking with her.  She reports remembering Dr. [name] coming to see her.  When asked about specific of conversation, she is vague and I believe she is trying to cover her confusion and that she cannot remember.   Length of Stay: 5  Current Medications: Scheduled Meds:  . anastrozole  1 mg Oral Daily  . Chlorhexidine Gluconate Cloth  6 each Topical Daily  . feeding supplement  1 Container Oral TID BM  . furosemide  20 mg Intravenous Q12H  . losartan  12.5 mg Oral Daily  . nitrofurantoin (macrocrystal-monohydrate)  100 mg Oral Q12H  . ondansetron (ZOFRAN) IV  4 mg Intravenous Q8H  . pantoprazole (PROTONIX) IV  40 mg Intravenous Q24H  . polyethylene glycol  17 g Oral Daily    Continuous Infusions:   PRN Meds: acetaminophen **OR** acetaminophen, hydrALAZINE, metoCLOPramide (REGLAN) injection, prochlorperazine, Zinc Oxide  Physical Exam         General: Awake, alert, chronically ill-appearing, Confused HEENT: No bruits, no goiter, no JVD Lungs: Good air movement, clear Abdomen: Soft, nondistended, positive bowel sounds.  Ext: Significant edema Skin: Warm and dry  Vital Signs: BP (!) 143/62   Pulse 99   Temp 98 F (36.7 C) (Oral)   Resp 16   Ht 5\' 9"  (1.753 m)   Wt 82.6 kg   LMP 03/18/1997   SpO2 (!) 85%   BMI 26.88 kg/m  SpO2: SpO2: (!) 85 % O2 Device: O2 Device: Room Air O2 Flow Rate:     Intake/output summary:   Intake/Output Summary (Last 24 hours) at 05/08/2020 1058 Last data filed at 05/08/2020 1032 Gross per 24 hour  Intake 640 ml  Output 4350 ml  Net -3710 ml   LBM: Last BM Date:  (PTA per pt) Baseline Weight: Weight: 82.6 kg Most recent weight: Weight: 82.6 kg       Palliative Assessment/Data:      Patient Active Problem  List   Diagnosis Date Noted  . Pressure injury of skin 05/08/2020  . Malnutrition of moderate degree 05/05/2020  . Intractable vomiting with nausea 05/03/2020  . Acute lower UTI 04/21/2020  . Dehydration 04/21/2020  . Depression with anxiety 04/21/2020  . Transaminitis 04/21/2020  . Metastatic malignant neoplasm ([place])   . Nausea vomiting and diarrhea   . Intractable nausea and vomiting 04/20/2020  . PICC (peripherally inserted central catheter) in place 04/17/2020  . Bone metastases ([place]) 03/11/2020  . Liver metastases ([place]) 03/05/2020  . Lung metastases ([place]) 03/05/2020  . Postoperative state 04/23/2019  . Superficial basal cell carcinoma ([facility]) 02/10/2019  . Goals of care, counseling/discussion 12/14/2017  . Family history of uterine cancer   . Family history of prostate cancer   . Melanoma in situ of right upper extremity ([place]) 07/18/2017  . Port-A-Cath in place 04/18/2017  . Drug-induced neutropenia ([place]) 03/09/2017  . Family history of breast cancer 02/02/2017  . Family history of colon cancer 02/02/2017  . Family history of prostate cancer in father 02/02/2017  . Malignant neoplasm of overlapping sites of left breast in female, estrogen receptor positive ([place]) 01/31/2017  . Hx of hot flashes, menopausal, HRT in the past 12/19/2011  . Osteopenia, stable, DEXA 2013, recs to repeat in 2018 12/19/2011  . Hx of Hyperlipidemia, LDL 144 in 07/2011 12/19/2011  . Vitamin D insufficiency 12/19/2011  . Fibroid     Palliative Care Assessment & Plan   Patient Profile: 70 y/o female with metastatic breast cancer followed by Dr.  [name] who presented with n/v and generalized weakness. She was admitted a couple of weeks ago with similar presentation. Work-up consistent with UTI for which she is on appropriate antibiotics. Palliative care followed her during previous hospitalization and was re-consulted this admission for ongoing goal of care discussion and symptom management.   Recommendations/Plan: DNR/DNI She appears confused today.  Agree with recommendation to consider d/c steroids. Standard delirium management (adapted from NICE guidelines 2011 for prevention of delirium):  Provide continuity of care when possible (avoid frequent changing of surroundings and staff).  Frequent reorientation to time with:  A clock should always be visible.  Make sure Calendar/white board is updated.  Lights on/blinds open during the day and off/closed at night.  Encourage frequent family visits.  Monitor for and treat dehydration/constipation.  Optimize oxygen saturation.  Avoid catheters and IV's when possible and look for/treat infections.  Encourage early mobility.  Assess and treat pain.  Ensure adequate nutrition and functioning dentures.  Address reversible causes of hearing and visual impairment:  Use pocket talker if hearing aids are unavailable.  Avoid sleep disturbance (normalize sleep/wake cycle).  Minimize disturbances and consider NOT obtaining vitals at night if possible.  Review Medications to avoid polypharmacy and avoid deliriogenic medications when possible:  Benzodiazepines.  Dihydropyridines.  Antihistamines.  Anticholinergics.  (Possibly avoid: H2 blockers, tricyclic antidepressants, antiparkinson medications, steroids, NSAID's).   She is committed to trying to get stronger and continuing disease modifying therapy. Plan for rehab on discharge and recommend palliative care follow-up at facility at time of discharge.  Goals of Care and Additional Recommendations: Limitations on Scope of Treatment: Full  Scope Treatment  Code Status:    Code Status Orders  (From admission, onward)         Start     Ordered   05/02/20 1941  Do not attempt resuscitation (DNR)  Continuous       Question Answer Comment  In the event of cardiac  or respiratory ARREST Do not call a "code blue"   In the event of cardiac or respiratory ARREST Do not perform Intubation, CPR, defibrillation or ACLS   In the event of cardiac or respiratory ARREST Use medication by any route, position, wound care, and other measures to relive pain and suffering. May use oxygen, suction and manual treatment of airway obstruction as needed for comfort.   Comments Confirmed with patient      05/02/20 1940        Code Status History    Date Active Date Inactive Code Status Order ID Comments User Context   04/22/2020 1030 04/24/2020 2028 DNR [id number]  Micheline Rough, MD Inpatient   04/20/2020 1807 04/22/2020 1029 Full Code [id number]  Jonnie Finner, DO ED   04/23/2019 1126 04/24/2019 1408 Full Code [id number]  [name], MD Inpatient   04/23/2019 0751 04/23/2019 1126 Full Code [id number]  [name], MD Inpatient   09/25/2017 1520 09/26/2017 1836 Full Code [id number]  Jovita Kussmaul, MD Inpatient   03/02/2017 1542 03/03/2017 1242 Full Code [id number]  Jovita Kussmaul, MD Inpatient   Advance Care Planning Activity      Prognosis:  Unable to determine  Discharge Planning: To Be Determined  Care plan was discussed with patient  Thank you for allowing the Palliative Medicine Team to assist in the care of this patient.   Total Time 20 Prolonged Time Billed No   Greater than 50%  of this time was spent counseling and coordinating care related to the above assessment and plan.  Micheline Rough, MD  Please contact Palliative Medicine Team phone at [phone] for questions and concerns.

## 2020-05-08 NOTE — Progress Notes (Signed)
Nhyira Leano Staubs   DOB:10/28/1950   SE#:[id number]   WVP#:[id number]  Subjective:  Tannya is very confused this AM. Nursing tells me this has been happening evenings but I have not witnessed it before. She takes a very long time to answer simple questions, sometimes does not get to an answer, continues to be easily distracted. Asked if she walked yesterday she said (after a long pause) "a half mile."   Objective: white woman examined in bed Vitals:   05/07/20 2101 05/08/20 0606  BP: (!) 151/65 (!) 143/62  Pulse: 100 99  Resp: 16   Temp: 97.9 F (36.6 C) 98 F (36.7 C)  SpO2: 91% (!) 85%    Body mass index is 26.88 kg/m.  Intake/Output Summary (Last 24 hours) at 05/08/2020 0855 Last data filed at 05/08/2020 0547 Gross per 24 hour  Intake 758 ml  Output 3600 ml  Net -2842 ml     Sclerae unicteric  Lungs no rales or wheezes--auscultated anterolaterally  Heart regular rate and rhythm  Abdomen soft, +BS  Neuro nonfocal  Breast exam: deferred  CBG (last 3)  No results for input(s): GLUCAP in the last 72 hours.   Labs:  Lab Results  Component Value Date   WBC 1.9 (L) 05/08/2020   HGB 8.1 (L) 05/08/2020   HCT 24.7 (L) 05/08/2020   MCV 89.8 05/08/2020   PLT 116 (L) 05/08/2020   NEUTROABS 4.6 04/30/2020    '@LASTCHEMISTRY'$ @  Urine Studies No results for input(s): UHGB, CRYS in the last 72 hours.  Invalid input(s): UACOL, UAPR, USPG, UPH, UTP, UGL, [place], UBIL, UNIT, UROB, [place], UEPI, UWBC, Junie Panning [place], [place], [place]  Basic Metabolic Panel: Recent Labs  Lab 05/02/20 1524 05/03/20 0609 05/04/20 0527 05/05/20 0459 05/07/20 0539 05/08/20 0539  NA 139 140 140 138 138 138  K 2.9* 3.5 3.4* 3.4* 3.9 3.4*  CL 103 106 105 105 105 102  CO2 20* 22 20* 20* 20* 22  GLUCOSE 103* 98 97 98 105* 112*  BUN $Re'13 11 10 10 11 13  'YGQ$ CREATININE 0.78 0.67 0.63 0.54 0.67 0.61  CALCIUM 7.9* 7.5* 7.6* 7.9* 8.7* 8.3*  MG 2.1  --   --   --   --   --   PHOS 3.0  --   --   --   --   --     GFR Estimated Creatinine Clearance: 76.3 mL/min (by C-G formula based on SCr of 0.61 mg/dL). Liver Function Tests: Recent Labs  Lab 05/02/20 1524 05/03/20 0609 05/04/20 0527 05/08/20 0539  AST 129* 107* 103* 107*  ALT 43 37 39 45*  ALKPHOS 194* 148* 169* 188*  BILITOT 2.6* 2.5*  2.4* 1.9* 2.2*  PROT 5.4* 4.6* 4.5* 4.5*  ALBUMIN 3.1* 2.6* 2.6* 2.5*   Recent Labs  Lab 05/02/20 1524  LIPASE 69*   No results for input(s): AMMONIA in the last 168 hours. Coagulation profile No results for input(s): INR, PROTIME in the last 168 hours.  CBC: Recent Labs  Lab 05/02/20 1524 05/03/20 0609 05/04/20 0527 05/07/20 0539 05/08/20 0539  WBC 4.6 3.8* 3.7* 1.6* 1.9*  HGB 9.3* 7.9* 8.1* 8.5* 8.1*  HCT 28.8* 24.3* 25.3* 26.7* 24.7*  MCV 90.0 90.3 92.0 91.1 89.8  PLT 129* 91* 94* 125* 116*   Cardiac Enzymes: No results for input(s): CKTOTAL, CKMB, CKMBINDEX, TROPONINI in the last 168 hours. BNP: Invalid input(s): POCBNP CBG: No results for input(s): GLUCAP in the last 168 hours. D-Dimer No results for input(s): DDIMER  in the last 72 hours. Hgb A1c No results for input(s): HGBA1C in the last 72 hours. Lipid Profile No results for input(s): CHOL, HDL, LDLCALC, TRIG, CHOLHDL, LDLDIRECT in the last 72 hours. Thyroid function studies No results for input(s): TSH, T4TOTAL, T3FREE, THYROIDAB in the last 72 hours.  Invalid input(s): FREET3 Anemia work up No results for input(s): VITAMINB12, FOLATE, FERRITIN, TIBC, IRON, RETICCTPCT in the last 72 hours. Microbiology Recent Results (from the past 240 hour(s))  Culture, Urine     Status: Abnormal   Collection Time: 04/30/20  1:00 PM   Specimen: Urine, Clean Catch  Result Value Ref Range Status   Specimen Description   Final    URINE, CLEAN CATCH Performed at [facility], [address], [place], [place] [address]    Special Requests   Final    NONE Performed at [facility],  [place] [address], [place], [place] [address]    Culture 20,000 COLONIES/mL ENTEROCOCCUS FAECALIS (A)  Final   Report Status 05/02/2020 FINAL  Final   Organism ID, Bacteria ENTEROCOCCUS FAECALIS (A)  Final      Susceptibility   Enterococcus faecalis - MIC*    AMPICILLIN <=2 SENSITIVE Sensitive     NITROFURANTOIN <=16 SENSITIVE Sensitive     VANCOMYCIN 2 SENSITIVE Sensitive     * 20,000 COLONIES/mL ENTEROCOCCUS FAECALIS  SARS CORONAVIRUS 2 (TAT 6-24 HRS) Nasopharyngeal Nasopharyngeal Swab     Status: None   Collection Time: 05/02/20  7:15 PM   Specimen: Nasopharyngeal Swab  Result Value Ref Range Status   SARS Coronavirus 2 NEGATIVE NEGATIVE Final    Comment: (NOTE) SARS-CoV-2 target nucleic acids are NOT DETECTED.  The SARS-CoV-2 RNA is generally detectable in upper and lower respiratory specimens during the acute phase of infection. Negative results do not preclude SARS-CoV-2 infection, do not rule out co-infections with other pathogens, and should not be used as the sole basis for treatment or other patient management decisions. Negative results must be combined with clinical observations, patient history, and epidemiological information. The expected result is Negative.  Fact Sheet for Patients: SugarRoll.be  Fact Sheet for Healthcare Providers: https://www.woods-mathews.com/  This test is not yet approved or cleared by the [place] FDA and  has been authorized for detection and/or diagnosis of SARS-CoV-2 by FDA under an Emergency Use Authorization (EUA). This EUA will remain  in effect (meaning this test can be used) for the duration of the COVID-19 declaration under Se ction 564(b)(1) of the Act, 21 U.S.C. section 360bbb-3(b)(1), unless the authorization is terminated or revoked sooner.  Performed at [facility], [place] [address], [place], [place] [address]       Studies:  MR BRAIN W WO CONTRAST  Result Date:  05/06/2020 CLINICAL DATA:  Staging of intracranial meningeal carcinomatosis. EXAM: MRI HEAD WITHOUT AND WITH CONTRAST TECHNIQUE: Multiplanar, multiecho pulse sequences of the brain and surrounding structures were obtained without and with intravenous contrast. CONTRAST:  74mL GADAVIST GADOBUTROL 1 MMOL/ML IV SOLN COMPARISON:  03/26/2020 FINDINGS: Brain: No acute infarct, mass effect or extra-axial collection. No acute or chronic hemorrhage. 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 The midline structures are normal. There is unchanged dural thickening, right greater than left. The thickest portion is at the anterior right convexity, measuring 6 mm, unchanged. There is a 7 mm extra-axial mass within the left posterior fossa that is unchanged (17:7. A multilobular inter hemispheric lesion measures 3.6 x 1.0 cm, unchanged. Vascular: Major flow voids are  preserved. Skull and upper cervical spine: Generally low T1-weighted signal throughout the calvarium Sinuses/Orbits:No paranasal sinus fluid levels or advanced mucosal thickening. No mastoid or middle ear effusion. Normal orbits. Other: Unchanged appearance of numerous subcutaneous lesions. IMPRESSION: 1. Unchanged right-greater-than-left dural thickening, which is greatest at the anterior right convexity. 2. Unchanged inter hemispheric extra-axial mass, likely metastatic disease. 3. Unchanged 7 mm extra-axial lesion of the left posterior fossa, which may be a meningioma or a focus of metastatic disease. Electronically Signed   By: Ulyses Jarred M.D.   On: 05/06/2020 22:44   ECHOCARDIOGRAM COMPLETE  Result Date: 05/07/2020    ECHOCARDIOGRAM REPORT   Patient Name:   SANDE PICKERT Date of Exam: 05/07/2020 Medical Rec #:  [id number]      Height:       69.0 in Accession #:    [id number]     Weight:       182.0 lb Date of Birth:  [birth year]     BSA:          1.985 m Patient Age:    70 years       BP:           107/65 mmHg Patient Gender: F              HR:           103 bpm.  Exam Location:  Inpatient Procedure: 2D Echo, Cardiac Doppler and Color Doppler Indications:    CHF  History:        Patient has no prior history of Echocardiogram examinations.                 Risk Factors:Hypertension and Dyslipidemia. Breast cancer,                 chemo, radiation.  Sonographer:    Dustin Flock Referring Phys: [id number] [place] IMPRESSIONS  1. Left ventricular ejection fraction, by estimation, is 60 to 65%. The left ventricle has normal function. The left ventricle has no regional wall motion abnormalities. Left ventricular diastolic parameters were normal.  2. Right ventricular systolic function is normal. The right ventricular size is normal. There is mildly elevated pulmonary artery systolic pressure.  3. The mitral valve is normal in structure. Trivial mitral valve regurgitation. No evidence of mitral stenosis.  4. The aortic valve is tricuspid. Aortic valve regurgitation is not visualized. No aortic stenosis is present.  5. The inferior vena cava is normal in size with greater than 50% respiratory variability, suggesting right atrial pressure of 3 mmHg. Comparison(s): No prior Echocardiogram. Conclusion(s)/Recommendation(s): Normal biventricular function without evidence of hemodynamically significant valvular heart disease. FINDINGS  Left Ventricle: Left ventricular ejection fraction, by estimation, is 60 to 65%. The left ventricle has normal function. The left ventricle has no regional wall motion abnormalities. The left ventricular internal cavity size was normal in size. There is  no left ventricular hypertrophy. Left ventricular diastolic parameters were normal. Right Ventricle: The right ventricular size is normal. No increase in right ventricular wall thickness. Right ventricular systolic function is normal. There is mildly elevated pulmonary artery systolic pressure. The tricuspid regurgitant velocity is 2.97  m/s, and with an assumed right atrial pressure of 3 mmHg, the  estimated right ventricular systolic pressure is 76.1 mmHg. Left Atrium: Left atrial size was normal in size. Right Atrium: Right atrial size was normal in size. Pericardium: There is no evidence of pericardial effusion. Mitral Valve: The mitral valve is normal in structure. Trivial mitral  valve regurgitation. No evidence of mitral valve stenosis. Tricuspid Valve: The tricuspid valve is normal in structure. Tricuspid valve regurgitation is trivial. No evidence of tricuspid stenosis. Aortic Valve: The aortic valve is tricuspid. Aortic valve regurgitation is not visualized. No aortic stenosis is present. Aortic valve peak gradient measures 10.8 mmHg. Pulmonic Valve: The pulmonic valve was not well visualized. Pulmonic valve regurgitation is not visualized. No evidence of pulmonic stenosis. Aorta: The aortic root, ascending aorta, aortic arch and descending aorta are all structurally normal, with no evidence of dilitation or obstruction. Venous: The inferior vena cava is normal in size with greater than 50% respiratory variability, suggesting right atrial pressure of 3 mmHg. IAS/Shunts: The atrial septum is grossly normal.  LEFT VENTRICLE PLAX 2D LVIDd:         4.80 cm  Diastology LVIDs:         2.60 cm  LV e' medial:    7.83 cm/s LV PW:         1.00 cm  LV E/e' medial:  10.0 LV IVS:        1.10 cm  LV e' lateral:   14.30 cm/s LVOT diam:     2.00 cm  LV E/e' lateral: 5.5 LV SV:         79 LV SV Index:   40 LVOT Area:     3.14 cm  RIGHT VENTRICLE RV Basal diam:  3.20 cm RV S prime:     12.30 cm/s TAPSE (M-mode): 1.9 cm LEFT ATRIUM             Index       RIGHT ATRIUM           Index LA diam:        2.70 cm 1.36 cm/m  RA Area:     12.60 cm LA Vol (A2C):   30.2 ml 15.22 ml/m RA Volume:   28.10 ml  14.16 ml/m LA Vol (A4C):   37.1 ml 18.69 ml/m LA Biplane Vol: 33.8 ml 17.03 ml/m  AORTIC VALVE AV Area (Vmax): 2.45 cm AV Vmax:        164.00 cm/s AV Peak Grad:   10.8 mmHg LVOT Vmax:      128.00 cm/s LVOT Vmean:      77.000 cm/s LVOT VTI:       0.253 m  AORTA Ao Root diam: 3.00 cm MITRAL VALVE               TRICUSPID VALVE MV Area (PHT): 5.38 cm    TR Peak grad:   35.3 mmHg MV Decel Time: 141 msec    TR Vmax:        297.00 cm/s MV E velocity: 78.20 cm/s MV A velocity: 72.20 cm/s  SHUNTS MV E/A ratio:  1.08        Systemic VTI:  0.25 m                            Systemic Diam: 2.00 cm Buford Dresser MD Electronically signed by Buford Dresser MD Signature Date/Time: 05/07/2020/3:47:58 PM    Final     Assessment: 70 y.o. 70 y.o.[place] woman status post bilateral biopsies 01/26/2017, showing  (1) in the right breast, a complex sclerosing lesion, status post lumpectomy 09/25/2017, with no malignancy identified.  (2) in the left breast, a cT2 pN1 invasive ductal carcinoma (with some lobular features but E-cadherin positive), grade 2, estrogen and progesterone receptor positive, HER-2 not  amplified, with an MIB-1 of 15-20% (a) breast MRI 02/04/2017 suggests a T3 N1 tumor  (3) started neoadjuvant cyclophosphamide/docetaxel01/05/2017, discontinuedafter 1 cycle with very poor tolerance (a) started cyclophosphamide/methotrexate/fluorouracil(CMF) 03/28/2017, repeated x7 cycles, last dose 08/01/2017  (4) status postleft modified radical mastectomy7/29/19 for a ypT5 ypN2, stage IIIAinvasive ductal carcinoma, grade 2, again estrogen and progesterone receptor positive, HER-2 not amplified  (5) adjuvant radiation completed 12/21/2017 (a) capecitabinesensitization tolerated only the initial week of radiation (b) Site/dose:The patient initially received a dose of 50.4 Gy in 28 fractions to the leftchest wall andleftsupraclavicular region. This was delivered using a 3-D conformal, 4 field technique. The patient then received a boost to the mastectomy scar. This delivered an additional 10 Gy in 5 fractions using an en face electron field. The total dose was 60.4  Gy.  ( 6)tamoxifen started 03/09/2018, discontinued January 2022 with metastatic progression  (7) genetics testing 12/12/2017 through the Multi-Gene Panel offered by [facility] found no deleterious mutationsin AIP, ALK, APC, ATM, AXIN2,BAP1, BARD1, BLM, BMPR1A, BRCA1, BRCA2, BRIP1, CASR, CDC73, CDH1, CDK4, CDKN1B, CDKN1C, CDKN2A (p14ARF), CDKN2A (p16INK4a), CEBPA, CHEK2, CTNNA1, DICER1, DIS3L2, EGFR (c.2369C>T, p.Thr790Met variant only), EPCAM (Deletion/duplication testing only), FH, FLCN, GATA2, GPC3, GREM1 (Promoter region deletion/duplication testing only), HOXB13 (c.251G>A, p.Gly84Glu), HRAS, KIT, MAX, MEN1, MET, MITF (c.952G>A, p.Glu318Lys variant only), MLH1, MSH2, MSH3, MSH6, MUTYH, NBN, NF1, NF2, NTHL1, PALB2, PDGFRA, PHOX2B, PMS2, POLD1, POLE, POT1, PRKAR1A, PTCH1, PTEN, RAD50, RAD51C, RAD51D, RB1, RECQL4, RET, RUNX1, SDHAF2, SDHA (sequence changes only), SDHB, SDHC, SDHD, SMAD4, SMARCA4, SMARCB1, SMARCE1, STK11, SUFU, TERC, TERT, TMEM127, TP53, TSC1, TSC2, VHL, WRN and WT1.   (8) Foundation 1 testing shows stable microsatellite status and 1 mutation/ Mb, TP38mutations x2 (a) PD-L1 dated July 2019 negative  METASTATIC DISEASE: January 2022 (9) CT angio of the chest 02/27/2020 shows multiple hilar nodes consistent with prior granulomatous infection, multiple small pulmonary nodules, multiple punctate granulomas throughout the liver and spleen, nodular hepatic contour and areas of mottling possibly due to steatosis, 1.6 cm left adrenal nodule (a) PET scan 03/05/2020 shows mildly to highly hypermetabolic adenopathy, the pulmonary nodules being mildly to not hypermetabolic, a large right hepatic liver lesion with an SUV of 13.2 and additional hypermetabolic right and left lobe foci, the adrenal lesion being hypermetabolic with an SUV of 16. There are also peritoneal nodules and innumerable foci of bony involvement. Incidental note was made of small heterogeneous foci of  increased uptake in the cerebellum. The CT images did not show gross abnormality in the visualized brain (b) on 03/09/2020 CA 27-29 was 983.0, CEA 744.17, the CA 19-9 normal at 32 (c) liver biopsy 03/13/2020 confirms metastati carcinoma, estrogen receptor positive, progesterone and HER 2 negative, with an Mib-1 of 40% (d) brain MRI 03/26/2020 shows dural involvement including a 1.0 cm dural-based cerebellar lesion  (10) paclitaxel started 03/30/2020, discontinued after three doses (last dose03/04/2020) with no clinical improvement  (11) zoledronate started 03/19/2020, to be repeated every 12 weeks  (12) anastrozole started 05/07/2020  (a) palbociclib to be added once counts recover  Plan:  I am not sure why Kameron is more confused today. Her [place] dropped after chemo but is recovering. LFTs are stable and GFR is WNL. As far as I can tell (reviewed with nurse) she has not received any lorazepam-- will d/c that agent just in case.  I m not sure why she is on dexamethasone--she was on that briefly for nausea control after chemo. If there is no other indication would d/c.  Sabrinia's situation is  complex. She has incurable stage IV breast cancer. We do have treatment--I have just started her on anastrozole. She is not a Hospice candidate as she wants t continue life-prolonging therapy.  The problem is her inability to swallow with consequent dehydration and severe malnutrition and physical weakness. I do not have a clear explanation for this--we just MRI'd brain and spine. Would consider a neurology consult to see if they can help [place] explain these symptoms.  She lives alone, family is out of town. Sister from [place] and sister from [place] are scheduled to come to [facility] 03/14 and stay a few days. I will be glad to meet with the patient and family either at [facility] or, if the patient is discharged to a Rehab facility, in our office.  She has an appointment in  my office 03/15 at 2 PM. If discharged please make sure the facility she will be going to is aware.  Thank you for your help to this patient!  Chauncey Cruel, MD 05/08/2020  8:55 AM Medical Oncology and Hematology [facility] [address] [place], [place] [address] Tel. [phone]    Fax. [phone]

## 2020-05-09 DIAGNOSIS — E876 Hypokalemia: Secondary | ICD-10-CM | POA: Diagnosis not present

## 2020-05-09 LAB — COMPREHENSIVE METABOLIC PANEL
ALT: 44 U/L (ref 0–44)
AST: 103 U/L — ABNORMAL HIGH (ref 15–41)
Albumin: 2.3 g/dL — ABNORMAL LOW (ref 3.5–5.0)
Alkaline Phosphatase: 177 U/L — ABNORMAL HIGH (ref 38–126)
Anion gap: 11 (ref 5–15)
BUN: 14 mg/dL (ref 8–23)
CO2: 29 mmol/L (ref 22–32)
Calcium: 8.2 mg/dL — ABNORMAL LOW (ref 8.9–10.3)
Chloride: 101 mmol/L (ref 98–111)
Creatinine, Ser: 0.72 mg/dL (ref 0.44–1.00)
GFR, Estimated: >60 mL/min (ref >60)
Glucose, Bld: 97 mg/dL (ref 70–99)
Potassium: 2.9 mmol/L — ABNORMAL LOW (ref 3.5–5.1)
Sodium: 141 mmol/L (ref 135–145)
Total Bilirubin: 2.1 mg/dL — ABNORMAL HIGH (ref 0.3–1.2)
Total Protein: 4.1 g/dL — ABNORMAL LOW (ref 6.5–8.1)

## 2020-05-09 LAB — CBC
HCT: 21.9 % — ABNORMAL LOW (ref 36.0–46.0)
Hemoglobin: 7.2 g/dL — ABNORMAL LOW (ref 12.0–15.0)
MCH: 29.4 pg (ref 26.0–34.0)
MCHC: 32.9 g/dL (ref 30.0–36.0)
MCV: 89.4 fL (ref 80.0–100.0)
Platelets: 99 10*3/uL — ABNORMAL LOW (ref 150–400)
RBC: 2.45 MIL/uL — ABNORMAL LOW (ref 3.87–5.11)
RDW: 23.9 % — ABNORMAL HIGH (ref 11.5–15.5)
WBC: 1.7 10*3/uL — ABNORMAL LOW (ref 4.0–10.5)
nRBC: 4 % — ABNORMAL HIGH (ref 0.0–0.2)

## 2020-05-09 LAB — CANCER ANTIGEN 27.29: CA 27.29: 1504.7 U/mL — ABNORMAL HIGH (ref 0.0–38.6)

## 2020-05-09 LAB — SARS CORONAVIRUS 2 (TAT 6-24 HRS): SARS Coronavirus 2: NEGATIVE

## 2020-05-09 LAB — MAGNESIUM: Magnesium: 1.8 mg/dL (ref 1.7–2.4)

## 2020-05-09 LAB — CEA: CEA: 570 ng/mL — ABNORMAL HIGH (ref 0.0–4.7)

## 2020-05-09 LAB — POTASSIUM: Potassium: 2.8 mmol/L — ABNORMAL LOW (ref 3.5–5.1)

## 2020-05-09 MED ORDER — FUROSEMIDE 20 MG PO TABS
20.0000 mg | ORAL_TABLET | Freq: Every day | ORAL | Status: DC
  Administered 2020-05-10 – 2020-05-12 (×2): 20 mg via ORAL
  Filled 2020-05-09 (×3): qty 1

## 2020-05-09 MED ORDER — POTASSIUM CHLORIDE 10 MEQ/100ML IV SOLN
10.0000 meq | INTRAVENOUS | Status: AC
  Administered 2020-05-09 (×4): 10 meq via INTRAVENOUS
  Filled 2020-05-09 (×3): qty 100

## 2020-05-09 MED ORDER — POTASSIUM CHLORIDE CRYS ER 20 MEQ PO TBCR
40.0000 meq | EXTENDED_RELEASE_TABLET | ORAL | Status: AC
  Administered 2020-05-09 (×2): 40 meq via ORAL
  Filled 2020-05-09 (×2): qty 2

## 2020-05-09 MED ORDER — POTASSIUM CHLORIDE 10 MEQ/100ML IV SOLN
10.0000 meq | INTRAVENOUS | Status: AC
  Administered 2020-05-09 (×2): 10 meq via INTRAVENOUS
  Filled 2020-05-09 (×2): qty 100

## 2020-05-09 MED ORDER — POTASSIUM CHLORIDE CRYS ER 20 MEQ PO TBCR
30.0000 meq | EXTENDED_RELEASE_TABLET | ORAL | Status: AC
  Administered 2020-05-09 (×2): 30 meq via ORAL
  Filled 2020-05-09 (×2): qty 1

## 2020-05-09 NOTE — Progress Notes (Signed)
PROGRESS NOTE    Brooke [name]  LNL:[id number] DOB: September 06, 1950 DOA: 05/02/2020 PCP: [name], MD   Chief Complaint  Patient presents with  . Failure To Thrive  . Nausea  Brief Narrative: 70 year old female with metastatic breast cancer, hypertension anxiety disorder anemia admitted with intractable nausea vomiting and generalized weakness.  She follows with Dr. [name] for chemotherapy and cancer care.  After her recent appointment she went home but unable to get up from chair was weak and brought to the ED, found to have UTI recently as well.Patient was admitted with intermittent nausea vomiting and placed on IV fluids in the ED.She is being followed by PT OT for generalized deconditioning weakness.  Subjective: Seen this morning.  She appears pleasantly confused.  No new complaints.   Potassium very low this morning with leukopenia thrombocytopenia Leg edematous  but improving had good urine output  Assessment & Plan: Intractable nausea and vomiting Generalized weakness/deconditioning Dry mouth Dehydration with lactic acidosis from poor po ijntake Multifactorial in setting of metastatic disease, UTI,Seen by oncology symptoms not explained by tumor progression.  Recommended skilled nursing facility. No obstructive bowel gas pattern In xray. Cont SLP, Diet as tolerated,Protonixm, Reglan /compazine/Ativan, off Decadron as per oncology.  Leg edema slowly improving on Lasix.  To help with deconditioning neurology has ordered high-dose thiamine, pending B1 B12 level.    Lower extremity edema b/l echo with normal EF.  Likely due to her low albumin, deconditioning and immobility.  Improving with good urine output, change to oral Lasix.Elevate the leg Net IO Since Admission: -6,940.44 mL [05/09/20 1123]  UTI with 20,000 colonies of Enterococcus faecalis Acute urine retention needing Foley Patient on Unasyn-s/px5 days> changed to nitrofurantoin 3/9-completed the course  3/12.  Metastatic breast cancer with the spinal mets MRI of cervical thoracic lumbar spine showed diffuse osseous metastatic disease.She has had #3 of paclitaxel on 04/30/20. 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   Acute metabolic encephalopathy appears pleasantly confused.likely from multiple comorbidities UTI deconditioning.  Nonfocal.  Continue supportive care delirium precaution PT OT and will need a skilled nursing facility  Pancytopenia with leukopenia thrombocytopenia and anemia: Likely due to her chemotherapy.  WBC count improving hemoglobin downtrending 7.2 gm plt also low at 99K.   Recent Labs  Lab 05/07/20 0539 05/08/20 0539 05/09/20 0538  HGB 8.5* 8.1* 7.2*  HCT 26.7* 24.7* 21.9*  WBC 1.6* 1.9* 1.7*  PLT 125* 116* 99*   Anemia of chronic Disease with underlying malignancy. monitor H&H transfuse less than 7 g. Recent Labs  Lab 05/03/20 0609 05/04/20 0527 05/07/20 0539 05/08/20 0539 05/09/20 0538  HGB 7.9* 8.1* 8.5* 8.1* 7.2*  HCT 24.3* 25.3* 26.7* 24.7* 21.9*   Hypokalemia:Severely low, likely from diuresis,repleting aggressively.Repeat K later today Hypertension controlled on losartan. Hypoalbuminemia with leg edema.Elevate leg.Augment nutrition.  Diet Order            Diet full liquid Room service appropriate? Yes; Fluid consistency: Thin  Diet effective now                 Nutrition Problem: Moderate Malnutrition Etiology: chronic illness,cancer and cancer related treatments Signs/Symptoms: percent weight loss,energy intake < or  equal to 75% for > or equal to 1 month,mild fat  depletion,mild muscle depletion Interventions: Boost Breeze,Refer to RD note for recommendations Patient's Body mass index is 26.88 kg/m.  DVT prophylaxis: Place TED hose Start: 05/02/20 1941 Code Status:   Code Status: DNR  Family Communication: plan of care discussed with patient at bedside. Discussed with palliative care and oncology team.  Status is: Inpatient Remains inpatient appropriate because:IV treatments appropriate due to intensity of illness or inability to take PO and Inpatient level of care appropriate due to severity of illness  Dispo: The patient is from: Home              Anticipated d/c is to: SNF  W/ PALLIATIVE CARE 1-2 days              Patient currently is not medically stable to d/c.   Difficult to place patient No    Unresulted Labs (From admission, onward)          Start     Ordered   05/09/20 2200  SARS CORONAVIRUS 2 (TAT 6-24 HRS) Nasopharyngeal Nasopharyngeal Swab  Once,   R       Question Answer Comment  Is this test for diagnosis or screening Screening   Symptomatic for COVID-19 as defined by CDC No   Hospitalized for COVID-19 No   Admitted to ICU for COVID-19 No   Previously tested for COVID-19 Yes   Resident in a congregate (group) care setting Yes   Employed in healthcare setting No   Pregnant No   Has patient completed COVID vaccination(s) (2 doses of Pfizer/Moderna 1 dose of Iago) Unknown      05/09/20 1122   05/09/20 1400  Potassium  Once,   R        05/09/20 0749   05/08/20 1633  Vitamin B12  ONCE - STAT,   STAT        05/08/20 1634   05/08/20 1633  Vitamin B1  ONCE - STAT,   STAT        05/08/20 1634   05/08/20 1633  Vitamin B6  ONCE - STAT,   STAT        05/08/20 1634   05/08/20 1633  Copper, serum  ONCE - STAT,   STAT        05/08/20 1634   05/08/20 1633  Zinc  ONCE - STAT,   STAT        05/08/20 1634   05/08/20 0500  Comprehensive metabolic panel  Daily,   R     Question:  Specimen collection method  Answer:   Lab=Lab collect   05/07/20 0852   05/08/20 0500  CBC  Daily,   R     Question:  Specimen collection method  Answer:  Lab=Lab collect   05/07/20 0852          Medications reviewed:  Scheduled Meds: . anastrozole  1 mg Oral Daily  . Chlorhexidine Gluconate Cloth  6 each Topical Daily  . feeding supplement  1 Container Oral TID BM  . [START ON 05/10/2020] furosemide  20 mg Oral Daily  . losartan  12.5 mg Oral Daily  . ondansetron (ZOFRAN) IV  4 mg Intravenous Q8H  . pantoprazole (PROTONIX) IV  40 mg Intravenous Q24H  . polyethylene glycol  17 g Oral Daily  . potassium chloride  40 mEq Oral Q3H  . [START ON 05/12/2020] thiamine injection  100 mg Intravenous Q24H   Continuous Infusions: .  potassium chloride 10 mEq (05/09/20 1107)  . thiamine injection 500 mg (05/09/20 0541)    Consultants:see note  Procedures:see note  Antimicrobials: Anti-infectives (From admission, onward)   Start     Dose/Rate Route Frequency Ordered Stop   05/06/20 1300  nitrofurantoin (macrocrystal-monohydrate) (MACROBID) capsule 100 mg        100 mg Oral Every 12 hours 05/06/20 1209 05/09/20 0959   05/02/20 1800  ampicillin (OMNIPEN) 1 g in sodium chloride 0.9 % 100 mL IVPB  Status:  Discontinued        1 g 300 mL/hr over 20 Minutes Intravenous Every 6 hours 05/02/20 1722 05/06/20 0955     Culture/Microbiology    Component Value Date/Time   SDES  04/30/2020 1300    URINE, CLEAN CATCH Performed at [facility], [place] [address], [place], [place] [address]    SPECREQUEST  04/30/2020 1300    NONE Performed at [facility], [place] [address] [place], [place] [address]    CULT 20,000 COLONIES/mL ENTEROCOCCUS FAECALIS (A) 04/30/2020 1300   REPTSTATUS 05/02/2020 FINAL 04/30/2020 1300    Other culture-see note  Objective: Vitals: Today's Vitals   05/08/20 1356 05/08/20 2019 05/08/20 2039 05/09/20 0452  BP: 140/70 (!) 132/59  (!) 127/47  Pulse: 100 (!)  102  98  Resp:  16  16  Temp: 98.2 F (36.8 C) (!) 97.5 F (36.4 C)  98 F (36.7 C)  TempSrc: Oral Oral  Oral  SpO2: 93% 92%  92%  Weight:      Height:      PainSc:   0-No pain     Intake/Output Summary (Last 24 hours) at 05/09/2020 1123 Last data filed at 05/09/2020 1048 Gross per 24 hour  Intake 200 ml  Output 2950 ml  Net -2750 ml   Filed Weights   05/02/20 1410  Weight: 82.6 kg   Weight change:   Intake/Output from previous day: 03/11 0701 - 03/12 0700 In: 200 [IV Piggyback:200] Out: 3000 [Urine:3000] Intake/Output this shift: Total I/O In: -  Out: 700 [Urine:700] Filed Weights   05/02/20 1410  Weight: 82.6 kg    Examination: General exam: AAO oriented to self place appears pleasantly confused older for her age. HEENT:Oral mucosa moist, Ear/Nose WNL grossly, dentition normal. Respiratory system: bilaterally diminishedd,no wheezing or crackles,no use of accessory muscle Cardiovascular system: S1 & S2 +, No JVD,. Gastrointestinal system: Abdomen soft, NT,ND, BS+ Nervous System:Alert, awake, moving extremities and grossly nonfocal Extremities: Bilateral lower leg edema improving, distal peripheral pulses palpable.  Skin: No rashes,no icterus. MSK: Normal muscle bulk,tone, power  Data Reviewed: I have personally reviewed following labs and imaging studies CBC: Recent Labs  Lab 05/03/20 0609 05/04/20 0527 05/07/20 0539 05/08/20 0539 05/09/20 0538  WBC 3.8* 3.7* 1.6* 1.9* 1.7*  HGB 7.9* 8.1* 8.5* 8.1* 7.2*  HCT 24.3* 25.3* 26.7* 24.7* 21.9*  MCV 90.3 92.0 91.1 89.8 89.4  PLT 91* 94* 125* 116* 99*   Basic Metabolic Panel: Recent Labs  Lab 05/02/20 1524 05/03/20 0609 05/04/20 0527 05/05/20 0459 05/07/20 0539 05/08/20 0539 05/09/20 0538  NA 139   < > 140 138 138 138 141  K 2.9*   < > 3.4* 3.4* 3.9 3.4* 2.9*  CL 103   < > 105 105 105 102 101  CO2 20*   < > 20* 20* 20* 22 29  GLUCOSE 103*   < > 97 98 105* 112* 97  BUN 13   < >  10 10 11 13 14    CREATININE 0.78   < > 0.63 0.54 0.67 0.61 0.72  CALCIUM 7.9*   < > 7.6* 7.9* 8.7* 8.3* 8.2*  MG 2.1  --   --   --   --   --  1.8  PHOS 3.0  --   --   --   --   --   --    < > = values in this interval not displayed.   GFR: Estimated Creatinine Clearance: 76.3 mL/min (by C-G formula based on SCr of 0.72 mg/dL). Liver Function Tests: Recent Labs  Lab 05/02/20 1524 05/03/20 0609 05/04/20 0527 05/08/20 0539 05/09/20 0538  AST 129* 107* 103* 107* 103*  ALT 43 37 39 45* 44  ALKPHOS 194* 148* 169* 188* 177*  BILITOT 2.6* 2.5*  2.4* 1.9* 2.2* 2.1*  PROT 5.4* 4.6* 4.5* 4.5* 4.1*  ALBUMIN 3.1* 2.6* 2.6* 2.5* 2.3*   Recent Labs  Lab 05/02/20 1524  LIPASE 69*   No results for input(s): AMMONIA in the last 168 hours. Coagulation Profile: No results for input(s): INR, PROTIME in the last 168 hours. Cardiac Enzymes: No results for input(s): CKTOTAL, CKMB, CKMBINDEX, TROPONINI in the last 168 hours. BNP (last 3 results) No results for input(s): PROBNP in the last 8760 hours. HbA1C: No results for input(s): HGBA1C in the last 72 hours. CBG: No results for input(s): GLUCAP in the last 168 hours. Lipid Profile: No results for input(s): CHOL, HDL, LDLCALC, TRIG, CHOLHDL, LDLDIRECT in the last 72 hours. Thyroid Function Tests: No results for input(s): TSH, T4TOTAL, FREET4, T3FREE, THYROIDAB in the last 72 hours. Anemia Panel: No results for input(s): VITAMINB12, FOLATE, FERRITIN, TIBC, IRON, RETICCTPCT in the last 72 hours. Sepsis Labs: Recent Labs  Lab 05/02/20 1524 05/03/20 1400  LATICACIDVEN 4.2* 2.9*    Recent Results (from the past 240 hour(s))  Culture, Urine     Status: Abnormal   Collection Time: 04/30/20  1:00 PM   Specimen: Urine, Clean Catch  Result Value Ref Range Status   Specimen Description   Final    URINE, CLEAN CATCH Performed at [facility], [place] [address], [place], [place] [address]    Special Requests   Final     NONE Performed at [facility], [place] [address] [place], [place] [address]    Culture 20,000 COLONIES/mL ENTEROCOCCUS FAECALIS (A)  Final   Report Status 05/02/2020 FINAL  Final   Organism ID, Bacteria ENTEROCOCCUS FAECALIS (A)  Final      Susceptibility   Enterococcus faecalis - MIC*    AMPICILLIN <=2 SENSITIVE Sensitive     NITROFURANTOIN <=16 SENSITIVE Sensitive     VANCOMYCIN 2 SENSITIVE Sensitive     * 20,000 COLONIES/mL ENTEROCOCCUS FAECALIS  SARS CORONAVIRUS 2 (TAT 6-24 HRS) Nasopharyngeal Nasopharyngeal Swab     Status: None   Collection Time: 05/02/20  7:15 PM   Specimen: Nasopharyngeal Swab  Result Value Ref Range Status   SARS Coronavirus 2 NEGATIVE NEGATIVE Final    Comment: (NOTE) SARS-CoV-2 target nucleic acids are NOT DETECTED.  The SARS-CoV-2 RNA is generally detectable in upper and lower respiratory specimens during the acute phase of infection. Negative results do not preclude SARS-CoV-2 infection, do not rule out co-infections with other pathogens, and should not be used as the sole basis for treatment or other patient management decisions. Negative results must be combined with clinical observations, patient history, and epidemiological information. The expected result is Negative.  Fact Sheet for Patients: SugarRoll.be  Fact Sheet for Healthcare Providers: https://www.woods-mathews.com/  This test is not yet approved or cleared by the [place] FDA and  has been authorized for detection and/or diagnosis of SARS-CoV-2 by FDA under an Emergency Use Authorization (EUA). This EUA will remain  in effect (meaning this test can be used) for the duration of the COVID-19 declaration under Se ction 564(b)(1) of the Act, 21 U.S.C. section 360bbb-3(b)(1), unless the authorization is terminated or revoked sooner.  Performed at [facility], [place] [address] [place], [place] [address]       Radiology Studies: ECHOCARDIOGRAM COMPLETE  Result Date: 05/07/2020    ECHOCARDIOGRAM REPORT   Patient Name:   ANANIAH MACIOLEK Date of Exam: 05/07/2020 Medical Rec #:  [id number]      Height:       69.0 in Accession #:    [id number]     Weight:       182.0 lb Date of Birth:  03/13/1950     BSA:          1.985 m Patient Age:    50 years       BP:           107/65 mmHg Patient Gender: F              HR:           103 bpm. Exam Location:  Inpatient Procedure: 2D Echo, Cardiac Doppler and Color Doppler Indications:    CHF  History:        Patient has no prior history of Echocardiogram examinations.                 Risk Factors:Hypertension and Dyslipidemia. Breast cancer,                 chemo, radiation.  Sonographer:    Dustin Flock Referring Phys: [id number] [place] IMPRESSIONS  1. Left ventricular ejection fraction, by estimation, is 60 to 65%. The left ventricle has normal function. The left ventricle has no regional wall motion abnormalities. Left ventricular diastolic parameters were normal.  2. Right ventricular systolic function is normal. The right ventricular size is normal. There is mildly elevated pulmonary artery systolic pressure.  3. The mitral valve is normal in structure. Trivial mitral valve regurgitation. No evidence of mitral stenosis.  4. The aortic valve is tricuspid. Aortic valve regurgitation is not visualized. No aortic stenosis is present.  5. The inferior vena cava is normal in size with greater than 50% respiratory variability, suggesting right atrial pressure of 3 mmHg. Comparison(s): No prior Echocardiogram. Conclusion(s)/Recommendation(s): Normal biventricular function without evidence of hemodynamically significant valvular heart disease. FINDINGS  Left Ventricle: Left ventricular ejection fraction, by estimation, is 60 to 65%. The left ventricle has normal function. The left ventricle has no regional wall motion abnormalities. The left ventricular internal cavity size was  normal in size. There is  no left ventricular hypertrophy. Left ventricular diastolic parameters were normal. Right Ventricle: The right ventricular size is normal. No increase in right ventricular wall thickness. Right ventricular systolic function is normal. There is mildly elevated pulmonary artery systolic pressure. The tricuspid regurgitant velocity is 2.97  m/s, and with an assumed right atrial pressure of 3 mmHg, the estimated right ventricular systolic pressure is 75.6 mmHg. Left Atrium: Left atrial size was normal in size. Right Atrium: Right atrial size was normal in size. Pericardium: There is no evidence of pericardial effusion. Mitral Valve: The mitral  valve is normal in structure. Trivial mitral valve regurgitation. No evidence of mitral valve stenosis. Tricuspid Valve: The tricuspid valve is normal in structure. Tricuspid valve regurgitation is trivial. No evidence of tricuspid stenosis. Aortic Valve: The aortic valve is tricuspid. Aortic valve regurgitation is not visualized. No aortic stenosis is present. Aortic valve peak gradient measures 10.8 mmHg. Pulmonic Valve: The pulmonic valve was not well visualized. Pulmonic valve regurgitation is not visualized. No evidence of pulmonic stenosis. Aorta: The aortic root, ascending aorta, aortic arch and descending aorta are all structurally normal, with no evidence of dilitation or obstruction. Venous: The inferior vena cava is normal in size with greater than 50% respiratory variability, suggesting right atrial pressure of 3 mmHg. IAS/Shunts: The atrial septum is grossly normal.  LEFT VENTRICLE PLAX 2D LVIDd:         4.80 cm  Diastology LVIDs:         2.60 cm  LV e' medial:    7.83 cm/s LV PW:         1.00 cm  LV E/e' medial:  10.0 LV IVS:        1.10 cm  LV e' lateral:   14.30 cm/s LVOT diam:     2.00 cm  LV E/e' lateral: 5.5 LV SV:         79 LV SV Index:   40 LVOT Area:     3.14 cm  RIGHT VENTRICLE RV Basal diam:  3.20 cm RV S prime:     12.30 cm/s  TAPSE (M-mode): 1.9 cm LEFT ATRIUM             Index       RIGHT ATRIUM           Index LA diam:        2.70 cm 1.36 cm/m  RA Area:     12.60 cm LA Vol (A2C):   30.2 ml 15.22 ml/m RA Volume:   28.10 ml  14.16 ml/m LA Vol (A4C):   37.1 ml 18.69 ml/m LA Biplane Vol: 33.8 ml 17.03 ml/m  AORTIC VALVE AV Area (Vmax): 2.45 cm AV Vmax:        164.00 cm/s AV Peak Grad:   10.8 mmHg LVOT Vmax:      128.00 cm/s LVOT Vmean:     77.000 cm/s LVOT VTI:       0.253 m  AORTA Ao Root diam: 3.00 cm MITRAL VALVE               TRICUSPID VALVE MV Area (PHT): 5.38 cm    TR Peak grad:   35.3 mmHg MV Decel Time: 141 msec    TR Vmax:        297.00 cm/s MV E velocity: 78.20 cm/s MV A velocity: 72.20 cm/s  SHUNTS MV E/A ratio:  1.08        Systemic VTI:  0.25 m                            Systemic Diam: 2.00 cm Buford Dresser MD Electronically signed by Buford Dresser MD Signature Date/Time: 05/07/2020/3:47:58 PM    Final      LOS: 6 days   Antonieta Pert, MD [facility]  05/09/2020, 11:23 AM

## 2020-05-10 DIAGNOSIS — E876 Hypokalemia: Secondary | ICD-10-CM | POA: Diagnosis not present

## 2020-05-10 LAB — COMPREHENSIVE METABOLIC PANEL
ALT: 44 U/L (ref 0–44)
AST: 118 U/L — ABNORMAL HIGH (ref 15–41)
Albumin: 2.4 g/dL — ABNORMAL LOW (ref 3.5–5.0)
Alkaline Phosphatase: 209 U/L — ABNORMAL HIGH (ref 38–126)
Anion gap: 10 (ref 5–15)
BUN: 16 mg/dL (ref 8–23)
CO2: 28 mmol/L (ref 22–32)
Calcium: 8.4 mg/dL — ABNORMAL LOW (ref 8.9–10.3)
Chloride: 102 mmol/L (ref 98–111)
Creatinine, Ser: 0.66 mg/dL (ref 0.44–1.00)
GFR, Estimated: >60 mL/min (ref >60)
Glucose, Bld: 91 mg/dL (ref 70–99)
Potassium: 3.9 mmol/L (ref 3.5–5.1)
Sodium: 140 mmol/L (ref 135–145)
Total Bilirubin: 2.9 mg/dL — ABNORMAL HIGH (ref 0.3–1.2)
Total Protein: 4.3 g/dL — ABNORMAL LOW (ref 6.5–8.1)

## 2020-05-10 LAB — CBC
HCT: 23.1 % — ABNORMAL LOW (ref 36.0–46.0)
Hemoglobin: 7.4 g/dL — ABNORMAL LOW (ref 12.0–15.0)
MCH: 29.2 pg (ref 26.0–34.0)
MCHC: 32 g/dL (ref 30.0–36.0)
MCV: 91.3 fL (ref 80.0–100.0)
Platelets: 89 10*3/uL — ABNORMAL LOW (ref 150–400)
RBC: 2.53 MIL/uL — ABNORMAL LOW (ref 3.87–5.11)
RDW: 23.9 % — ABNORMAL HIGH (ref 11.5–15.5)
WBC: 1.9 10*3/uL — ABNORMAL LOW (ref 4.0–10.5)
nRBC: 14.2 % — ABNORMAL HIGH (ref 0.0–0.2)

## 2020-05-10 LAB — VITAMIN B12: Vitamin B-12: 52 pg/mL — ABNORMAL LOW (ref 180–914)

## 2020-05-10 MED ORDER — DRONABINOL 2.5 MG PO CAPS
2.5000 mg | ORAL_CAPSULE | Freq: Every day | ORAL | Status: DC
  Administered 2020-05-10: 2.5 mg via ORAL
  Filled 2020-05-10: qty 1

## 2020-05-10 MED ORDER — CYANOCOBALAMIN 1000 MCG/ML IJ SOLN
1000.0000 ug | Freq: Every day | INTRAMUSCULAR | Status: AC
  Administered 2020-05-10 – 2020-05-12 (×3): 1000 ug via SUBCUTANEOUS
  Filled 2020-05-10 (×3): qty 1

## 2020-05-10 MED ORDER — PANTOPRAZOLE SODIUM 40 MG PO PACK
40.0000 mg | PACK | Freq: Every day | ORAL | Status: DC
  Administered 2020-05-10 – 2020-05-11 (×2): 40 mg via ORAL
  Filled 2020-05-10 (×3): qty 20

## 2020-05-10 MED ORDER — SODIUM CHLORIDE 0.9 % IV BOLUS
500.0000 mL | Freq: Once | INTRAVENOUS | Status: AC
  Administered 2020-05-10: 500 mL via INTRAVENOUS

## 2020-05-10 NOTE — Progress Notes (Signed)
PROGRESS NOTE    Brooke GILLES  [name]:[id number] DOB: 10-19-1950 DOA: 05/02/2020 PCP: [name], MD   Chief Complaint  Patient presents with  . Failure To Thrive  . Nausea  Brief Narrative: 70 year old female with metastatic breast cancer, hypertension anxiety disorder anemia admitted with intractable nausea vomiting and generalized weakness.  She follows with Dr. [name] for chemotherapy and cancer care.  After her recent appointment she went home but unable to get up from chair was weak and brought to the ED, found to have UTI recently as well.Patient was admitted with intermittent nausea vomiting and placed on IV fluids in the ED.She is followed by PT OT for generalized deconditioning weakness. Patient was treated for intractable nausea vomiting and dehydration with lactic acidosis with poor oral intake in the setting of UTI metastatic disease.  She was seen by oncology. PT has recommended skilled nursing facility.  At this time tolerating p.o but mostly taking liquid/ice cream.  Subjective: Seen and examined.  Patient appears weak frail.  Nursing reports she has been refusing solid food but only likes ice cream.  Discussed with the patient she is willing to try solid food today Afebrile overnight.  Hypokalemia has resolved Neutropenic but slowly coming up  Assessment & Plan: Intractable nausea and vomiting Generalized weakness/deconditioning Dry mouth Dehydration with lactic acidosis from poor po ijntake Multifactorial in setting of metastatic disease, UTI, B12 deficiency. seen by oncology.  Recommended skilled nursing facility. No obstructive bowel gas pattern In xray has been tolerating full liquid diet advanced to soft diet. Continue on Protonix, as needed nausea medication with Reglan /compazine/Ativan, off Decadron as per oncology. Add po marinol to stimulate appetite. To help with deconditioning neurology has ordered high-dose thiamine, pending B1, B12 level is low and  will order replacement with high-dose B12.    Lower extremity edema b/l echo with normal EF.  Likely due to her low albumin, deconditioning and immobility.  Status post IV Lasix with good negative balance.  We will keep on oral Lasix.  Augment diet high-protein diet.  Elevate leg.  Net IO Since Admission: -6,940.13 mL [05/10/20 0750]  B12 deficiency at 70.  Will start replacement with 1000 MCG SQ daily x3 doses then orally  UTI with 20,000 colonies of Enterococcus faecalis Acute urine retention needing Foley Patient on Unasyn-s/px5 days> changed to nitrofurantoin 3/9-completed the course 3/12. Will discontinue Foley and do voiding trial today, q8 hr bladder scan.  Metastatic breast cancer with the spinal mets MRI of cervical thoracic lumbar spine showed diffuse osseous metastatic disease.She has had #3 of paclitaxel on 04/30/20. 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   Acute metabolic encephalopathy she has been alert awake but pleasantly confused interactive. 2/2 UTI deconditioning.Nonfocal on exam.Cont supportive care,delirium precautions,PT/OT and SNF.  Pancytopenia with leukopenia thrombocytopenia and anemia: Likely due to her chemotherapy.  WBC count improving platelet downtrending 89K and also hemoglobin slightly up. 7.4 g.  B12 deficiency also contributing.  We will start replacement.  Monitor CBC daily. Recent Labs  Lab 05/08/20 0539 05/09/20 0538 05/10/20 0551  HGB 8.1* 7.2* 7.4*  HCT 24.7* 21.9* 23.1*  WBC 1.9* 1.7* 1.9*  PLT 116* 99* 89*  Anemia of chronic Disease with underlying malignancy.  monitor H&H, transfuse if less than 7 g. Recent Labs  Lab 05/04/20 0527 05/07/20 0539 05/08/20 0539 05/09/20 0538 05/10/20 0551  HGB 8.1* 8.5* 8.1* 7.2* 7.4*  HCT 25.3* 26.7* 24.7* 21.9* 23.1*   Hypokalemia:Severely low, due to diuresis.  Now resolved.   Hypertension stable on losartan. Hypoalbuminemia with leg edema.Augment nutrition.  Diet Order            Diet full liquid Room service appropriate? Yes; Fluid consistency: Thin  Diet effective now                 Nutrition Problem: Moderate Malnutrition Etiology: chronic illness,cancer and cancer related treatments Signs/Symptoms: percent weight loss,energy intake < or equal to 75% for > or equal to 1 month,mild fat depletion,mild muscle depletion Interventions: Boost Breeze,Refer to RD note for recommendations Patient's Body mass index is 26.88 kg/m.  DVT prophylaxis: Place TED hose Start: 05/02/20 1941 Code Status:   Code Status: DNR  Family Communication: plan of care discussed with patient at bedside. Discussed with palliative care and oncology team.  Status is: Inpatient Remains inpatient appropriate because:IV treatments appropriate due to intensity of illness or inability to take PO and Inpatient level of care appropriate due to severity of illness  Dispo: The patient is from: Home              Anticipated d/c is to: SNF  W/ palliative likely tomorrow if sable. Had covid swab 3/12              Patient currently is not medically stable to d/c.   Difficult to place patient No    Unresulted Labs (From admission, onward)          Start     Ordered   05/10/20 0500  Methylmalonic acid, serum  Tomorrow morning,   R        05/09/20 2051   05/08/20 1633  Vitamin B1  ONCE - STAT,   STAT        05/08/20 1634   05/08/20 1633  Vitamin B6  ONCE - STAT,   STAT        05/08/20 1634   05/08/20 1633  Copper, serum  ONCE - STAT,   STAT        05/08/20 1634   05/08/20 1633  Zinc  ONCE - STAT,   STAT        05/08/20 1634           Medications reviewed:  Scheduled Meds: . anastrozole  1 mg Oral Daily  . Chlorhexidine Gluconate Cloth  6 each Topical Daily  . cyanocobalamin  1,000 mcg Subcutaneous Q1400  . feeding supplement  1 Container Oral TID BM  . furosemide  20 mg Oral Daily  . losartan  12.5 mg Oral Daily  . ondansetron (ZOFRAN) IV  4 mg Intravenous Q8H  . pantoprazole (PROTONIX) IV  40 mg Intravenous Q24H  . polyethylene glycol  17 g Oral Daily  . [START ON 05/12/2020] thiamine injection  100 mg Intravenous Q24H   Continuous Infusions: . thiamine injection 500 mg (05/10/20 0549)    Consultants:see note  Procedures:see note  Antimicrobials: Anti-infectives (From admission, onward)   Start     Dose/Rate Route Frequency Ordered Stop   05/06/20 1300  nitrofurantoin (macrocrystal-monohydrate) (MACROBID) capsule 100 mg        100 mg Oral Every 12 hours 05/06/20 1209 05/09/20 0959   05/02/20 1800  ampicillin (  OMNIPEN) 1 g in sodium chloride 0.9 % 100 mL IVPB  Status:  Discontinued        1 g 300 mL/hr over 20 Minutes Intravenous Every 6 hours 05/02/20 1722 05/06/20 0955     Culture/Microbiology    Component Value Date/Time   SDES  04/30/2020 1300    URINE, CLEAN CATCH Performed at [facility], [address], [place], [place] [address]    SPECREQUEST  04/30/2020 1300    NONE Performed at [facility], [place] [address], [place], [place] [address]    CULT 20,000 COLONIES/mL ENTEROCOCCUS FAECALIS (A) 04/30/2020 1300   REPTSTATUS 05/02/2020 FINAL 04/30/2020 1300    Other culture-see note  Objective: Vitals: Today's Vitals   05/09/20 1400 05/09/20 2001 05/09/20 2133 05/10/20 0452  BP: 136/68 (!) 119/38  (!) 113/46  Pulse: 98 89  89  Resp: 17 20  14   Temp: 98.2 F (36.8 C) 98 F (36.7 C)  98.8 F (37.1 C)  TempSrc: Oral Oral  Oral  SpO2: 97% 95%  96%  Weight:      Height:      PainSc:   0-No pain     Intake/Output Summary  (Last 24 hours) at 05/10/2020 0750 Last data filed at 05/10/2020 0601 Gross per 24 hour  Intake 250.31 ml  Output 950 ml  Net -699.69 ml   Filed Weights   05/02/20 1410  Weight: 82.6 kg   Weight change:   Intake/Output from previous day: 03/12 0701 - 03/13 0700 In: 250.3 [P.O.:120; IV Piggyback:130.3] Out: 950 [Urine:950] Intake/Output this shift: No intake/output data recorded. Filed Weights   05/02/20 1410  Weight: 82.6 kg    Examination: General exam: AA oriented, ill looking frail, pleasantly confused but stable. HEENT:Oral mucosa moist, Ear/Nose WNL grossly, dentition normal. Respiratory system: bilaterally diminishedd,no wheezing or crackles,no use of accessory muscle Cardiovascular system: S1 & S2 +, No JVD,. Gastrointestinal system: Abdomen soft, NT,ND, BS+ Nervous System:Alert, awake, moving extremities and grossly nonfocal Extremities:  Edema ++, distal peripheral pulses palpable.  Skin: No rashes,no icterus. MSK: Normal muscle bulk,tone, power  Data Reviewed: I have personally reviewed following labs and imaging studies CBC: Recent Labs  Lab 05/04/20 0527 05/07/20 0539 05/08/20 0539 05/09/20 0538 05/10/20 0551  WBC 3.7* 1.6* 1.9* 1.7* 1.9*  HGB 8.1* 8.5* 8.1* 7.2* 7.4*  HCT 25.3* 26.7* 24.7* 21.9* 23.1*  MCV 92.0 91.1 89.8 89.4 91.3  PLT 94* 125* 116* 99* 89*   Basic Metabolic Panel: Recent Labs  Lab 05/05/20 0459 05/07/20 0539 05/08/20 0539 05/09/20 0538 05/09/20 1453 05/10/20 0551  NA 138 138 138 141  --  140  K 3.4* 3.9 3.4* 2.9* 2.8* 3.9  CL 105 105 102 101  --  102  CO2 20* 20* 22 29  --  28  GLUCOSE 98 105* 112* 97  --  91  BUN 10 11 13 14   --  16  CREATININE 0.54 0.67 0.61 0.72  --  0.66  CALCIUM 7.9* 8.7* 8.3* 8.2*  --  8.4*  MG  --   --   --  1.8  --   --    GFR: Estimated Creatinine Clearance: 76.3 mL/min (by C-G formula based on SCr of 0.66 mg/dL). Liver Function Tests: Recent Labs  Lab 05/04/20 0527 05/08/20 0539  05/09/20 0538 05/10/20 0551  AST 103* 107* 103* 118*  ALT 39 45* 44 44  ALKPHOS 169* 188* 177* 209*  BILITOT 1.9* 2.2* 2.1* 2.9*  PROT 4.5* 4.5* 4.1* 4.3*  ALBUMIN 2.6* 2.5* 2.3* 2.4*   No results for input(s): LIPASE, AMYLASE in the last 168 hours. No results for input(s): AMMONIA in the last 168 hours. Coagulation Profile: No results for input(s): INR, PROTIME in the last 168 hours. Cardiac Enzymes: No results for input(s): CKTOTAL, CKMB, CKMBINDEX, TROPONINI in the last 168 hours. BNP (last 3 results) No results for input(s): PROBNP in the last 8760 hours. HbA1C: No results for input(s): HGBA1C in the last 72 hours. CBG: No results for input(s): GLUCAP in the last 168 hours. Lipid Profile: No results for input(s): CHOL, HDL, LDLCALC, TRIG, CHOLHDL, LDLDIRECT in the last 72 hours. Thyroid Function Tests: No results for input(s): TSH, T4TOTAL, FREET4, T3FREE, THYROIDAB in the last 72 hours. Anemia Panel: Recent Labs    05/10/20 0551  VITAMINB12 52*   Sepsis Labs: Recent Labs  Lab 05/03/20 1400  LATICACIDVEN 2.9*    Recent Results (from the past 240 hour(s))  Culture, Urine     Status: Abnormal   Collection Time: 04/30/20  1:00 PM   Specimen: Urine, Clean Catch  Result Value Ref Range Status   Specimen Description   Final    URINE, CLEAN CATCH Performed at [facility], [address] [place], [place] [address]    Special Requests   Final    NONE Performed at [facility], [place] [address], [place], [place] [address]    Culture 20,000 COLONIES/mL ENTEROCOCCUS FAECALIS (A)  Final   Report Status 05/02/2020 FINAL  Final   Organism ID, Bacteria ENTEROCOCCUS FAECALIS (A)  Final      Susceptibility   Enterococcus faecalis - MIC*    AMPICILLIN <=2 SENSITIVE Sensitive     NITROFURANTOIN <=16 SENSITIVE Sensitive     VANCOMYCIN 2 SENSITIVE Sensitive     * 20,000 COLONIES/mL ENTEROCOCCUS FAECALIS  SARS CORONAVIRUS 2  (TAT 6-24 HRS) Nasopharyngeal Nasopharyngeal Swab     Status: None   Collection Time: 05/02/20  7:15 PM   Specimen: Nasopharyngeal Swab  Result Value Ref Range Status   SARS Coronavirus 2 NEGATIVE NEGATIVE Final    Comment: (NOTE) SARS-CoV-2 target nucleic acids are NOT DETECTED.  The SARS-CoV-2 RNA is generally detectable in upper and lower respiratory specimens during the acute phase of infection. Negative results do not preclude SARS-CoV-2 infection, do not rule out co-infections with other pathogens, and should not be used as the sole basis for treatment or other patient management decisions. Negative results must be combined with clinical observations, patient history, and epidemiological information. The expected result is Negative.  Fact Sheet for Patients: SugarRoll.be  Fact Sheet for Healthcare Providers: https://www.woods-mathews.com/  This test is not yet approved or cleared by the [place] FDA and  has been authorized for detection and/or diagnosis of SARS-CoV-2 by FDA under an Emergency Use Authorization (EUA). This EUA will remain  in effect (meaning this test can be used) for the duration of the COVID-19 declaration under Se ction 564(b)(1) of the Act, 21 U.S.C. section 360bbb-3(b)(1), unless the authorization is terminated or revoked sooner.  Performed at [facility], [place] [address], [place], [place] [address]   SARS CORONAVIRUS 2 (TAT 6-24 HRS) Nasopharyngeal Nasopharyngeal Swab     Status: None   Collection Time: 05/09/20  1:58 PM   Specimen: Nasopharyngeal Swab  Result Value Ref Range Status   SARS Coronavirus 2 NEGATIVE NEGATIVE Final    Comment: (NOTE) SARS-CoV-2 target nucleic acids are NOT  DETECTED.  The SARS-CoV-2 RNA is generally detectable in upper and lower respiratory specimens during the acute phase of infection. Negative results do not preclude SARS-CoV-2 infection, do not rule  out co-infections with other pathogens, and should not be used as the sole basis for treatment or other patient management decisions. Negative results must be combined with clinical observations, patient history, and epidemiological information. The expected result is Negative.  Fact Sheet for Patients: SugarRoll.be  Fact Sheet for Healthcare Providers: https://www.woods-mathews.com/  This test is not yet approved or cleared by the [place] FDA and  has been authorized for detection and/or diagnosis of SARS-CoV-2 by FDA under an Emergency Use Authorization (EUA). This EUA will remain  in effect (meaning this test can be used) for the duration of the COVID-19 declaration under Se ction 564(b)(1) of the Act, 21 U.S.C. section 360bbb-3(b)(1), unless the authorization is terminated or revoked sooner.  Performed at [facility], [place] [address], [place], [place] [address]      Radiology Studies: No results found.   LOS: 7 days   Antonieta Pert, MD [facility]  05/10/2020, 7:50 AM

## 2020-05-11 ENCOUNTER — Other Ambulatory Visit: Payer: Self-pay | Admitting: Oncology

## 2020-05-11 ENCOUNTER — Telehealth: Payer: Self-pay | Admitting: Oncology

## 2020-05-11 DIAGNOSIS — C7801 Secondary malignant neoplasm of right lung: Secondary | ICD-10-CM | POA: Diagnosis not present

## 2020-05-11 DIAGNOSIS — C7951 Secondary malignant neoplasm of bone: Secondary | ICD-10-CM | POA: Diagnosis not present

## 2020-05-11 DIAGNOSIS — Z7189 Other specified counseling: Secondary | ICD-10-CM | POA: Diagnosis not present

## 2020-05-11 DIAGNOSIS — D519 Vitamin B12 deficiency anemia, unspecified: Secondary | ICD-10-CM | POA: Insufficient documentation

## 2020-05-11 DIAGNOSIS — R112 Nausea with vomiting, unspecified: Secondary | ICD-10-CM | POA: Diagnosis not present

## 2020-05-11 DIAGNOSIS — E876 Hypokalemia: Secondary | ICD-10-CM | POA: Diagnosis not present

## 2020-05-11 DIAGNOSIS — D518 Other vitamin B12 deficiency anemias: Secondary | ICD-10-CM

## 2020-05-11 LAB — ZINC: Zinc: 64 ug/dL (ref 44–115)

## 2020-05-11 LAB — CBC
HCT: 22.5 % — ABNORMAL LOW (ref 36.0–46.0)
Hemoglobin: 7.2 g/dL — ABNORMAL LOW (ref 12.0–15.0)
MCH: 29.4 pg (ref 26.0–34.0)
MCHC: 32 g/dL (ref 30.0–36.0)
MCV: 91.8 fL (ref 80.0–100.0)
Platelets: 54 10*3/uL — ABNORMAL LOW (ref 150–400)
RBC: 2.45 MIL/uL — ABNORMAL LOW (ref 3.87–5.11)
RDW: 24.3 % — ABNORMAL HIGH (ref 11.5–15.5)
WBC: 2.1 10*3/uL — ABNORMAL LOW (ref 4.0–10.5)
nRBC: 5.3 % — ABNORMAL HIGH (ref 0.0–0.2)

## 2020-05-11 LAB — COMPREHENSIVE METABOLIC PANEL
ALT: 41 U/L (ref 0–44)
AST: 105 U/L — ABNORMAL HIGH (ref 15–41)
Albumin: 2.4 g/dL — ABNORMAL LOW (ref 3.5–5.0)
Alkaline Phosphatase: 227 U/L — ABNORMAL HIGH (ref 38–126)
Anion gap: 11 (ref 5–15)
BUN: 18 mg/dL (ref 8–23)
CO2: 26 mmol/L (ref 22–32)
Calcium: 8.2 mg/dL — ABNORMAL LOW (ref 8.9–10.3)
Chloride: 104 mmol/L (ref 98–111)
Creatinine, Ser: 0.72 mg/dL (ref 0.44–1.00)
GFR, Estimated: >60 mL/min (ref >60)
Glucose, Bld: 109 mg/dL — ABNORMAL HIGH (ref 70–99)
Potassium: 3.3 mmol/L — ABNORMAL LOW (ref 3.5–5.1)
Sodium: 141 mmol/L (ref 135–145)
Total Bilirubin: 2.5 mg/dL — ABNORMAL HIGH (ref 0.3–1.2)
Total Protein: 4.2 g/dL — ABNORMAL LOW (ref 6.5–8.1)

## 2020-05-11 LAB — ABO/RH: ABO/RH(D): A POS

## 2020-05-11 LAB — COPPER, SERUM: Copper: 136 ug/dL (ref 80–158)

## 2020-05-11 LAB — PREPARE RBC (CROSSMATCH)

## 2020-05-11 MED ORDER — TAMSULOSIN HCL 0.4 MG PO CAPS
0.4000 mg | ORAL_CAPSULE | Freq: Every day | ORAL | Status: DC
  Administered 2020-05-11: 0.4 mg via ORAL
  Filled 2020-05-11 (×2): qty 1

## 2020-05-11 MED ORDER — SODIUM CHLORIDE 0.9% IV SOLUTION: Freq: Once | INTRAVENOUS | Status: AC

## 2020-05-11 MED ORDER — HYDROCORTISONE 1 % EX CREA
TOPICAL_CREAM | Freq: Two times a day (BID) | CUTANEOUS | Status: DC
  Administered 2020-05-11: 1 via TOPICAL
  Filled 2020-05-11: qty 28

## 2020-05-11 MED ORDER — ANASTROZOLE 1 MG PO TABS: 1.0000 mg | ORAL_TABLET | Freq: Every day | ORAL | Status: AC

## 2020-05-11 MED ORDER — TAMSULOSIN HCL 0.4 MG PO CAPS: 0.4000 mg | ORAL_CAPSULE | Freq: Every day | ORAL | Status: AC

## 2020-05-11 MED ORDER — VITAMIN B-12 1000 MCG PO TABS: 1000.0000 ug | ORAL_TABLET | Freq: Every day | ORAL | Status: AC

## 2020-05-11 MED ORDER — DRONABINOL 2.5 MG PO CAPS: 2.5000 mg | ORAL_CAPSULE | Freq: Every day | ORAL | Status: AC

## 2020-05-11 MED ORDER — POTASSIUM CHLORIDE 20 MEQ PO PACK
40.0000 meq | PACK | Freq: Once | ORAL | Status: AC
  Administered 2020-05-11: 40 meq via ORAL
  Filled 2020-05-11: qty 2

## 2020-05-11 MED ORDER — DIPHENHYDRAMINE HCL 25 MG PO CAPS: 25.0000 mg | ORAL_CAPSULE | Freq: Three times a day (TID) | ORAL | Status: DC | PRN

## 2020-05-11 NOTE — Progress Notes (Signed)
Spiritual care providing support with pt and sisters at bedside.  Responding to spiritual care consult re: adv. Dir.     Provided education and support.  Pt completed Adv. Dir. Paperwork, naming sisters HCPOA.  Elected not to complete living will.    Contacted AC and scheduled for night AC to assist in notarizing document this evening.

## 2020-05-11 NOTE — Progress Notes (Signed)
PROGRESS NOTE    Brooke [name]  JOA:[id number] DOB: [birth year] DOA: 05/02/2020 PCP: [name], MD   Chief Complaint  Patient presents with  . Failure To Thrive  . Nausea  Brief Narrative: 70 year old female with metastatic breast cancer, hypertension anxiety disorder anemia admitted with intractable nausea vomiting and generalized weakness.  She follows with Dr. [name] for chemotherapy and cancer care.  After her recent appointment she went home but unable to get up from chair was weak and brought to the ED, found to have UTI recently as well.Patient was admitted with intermittent nausea vomiting and placed on IV fluids in the ED.She is followed by PT OT for generalized deconditioning weakness. Patient was treated for intractable nausea vomiting and dehydration with lactic acidosis with poor oral intake in the setting of UTI metastatic disease.  She was seen by oncology. PT has recommended skilled nursing facility.  At this time tolerating p.o but mostly taking liquid/ice cream. Getting 1 UNIT PRBC TODAY, OTHERWISE STABLE. PLANNING ON SNF  Subjective: Remains very weak and deconditioned.   Seen this morning alongside Dr. [name]   Assessment & Plan: Intractable nausea and vomiting Generalized weakness/deconditioning Dry mouth Dehydration with lactic acidosis from poor po ijntake Multifactorial in setting of metastatic disease, UTI, B12 deficiency. seen by oncology.  Recommended skilled nursing facility. No obstructive bowel gas pattern In xray has been tolerating full liquid diet-not wanting to eat solid food.  Continue supportive measures PPI nausea medication.  Augment diet.  On high-dose thiamine pending B1 B12 level is low and continue on replacement.  Continue PT OT.  Continue p.o. Marinol  Lower extremity edema b/l echo with normal EF.  Likely due to her low albumin, deconditioning and immobility.  Status post IV Lasix with good negative balance and Lexxel swelling has  much improved, continue p.o. Lasix from tomorrow blood pressure tolerates.  Augment nutrition, elevate leg. Net IO Since Admission: -8,073.38 mL [05/11/20 1142]  B12 deficiency at 23.  Continue with 1000 MCG SQ daily x3 doses then orally versus outpatient replacement by Dr. [name] who is aware  UTI with 20,000 colonies of Enterococcus faecalis Acute urine retention needing Foley Patient on Unasyn-s/px5 days> changed to nitrofurantoin 3/9-completed the course 3/12. Off Foley catheter but needing in and out catheterization.  Monitor bladder scan voiding trial if unable to void may need to go back with Foley  Metastatic breast cancer with the spinal mets MRI of cervical thoracic lumbar spine showed diffuse osseous metastatic disease.She has had #3 of paclitaxel on 04/30/20. 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   Acute metabolic encephalopathy she has been alert awake but pleasantly confused interactive. 2/2 UTI deconditioning.Nonfocal on exam.Cont supportive care,delirium precautions,PT/OT and SNF.  Pancytopenia with leukopenia thrombocytopenia and anemia due to chemotherapy and B12 deficiency:WBC count improving platelet further dropped to 54K.  Hemoglobin dropped to 7.2 g -discussed with Dr. [name] platelets counts are adequate and advises 1 unit PRBC for symptomatic anemia.  Continue B12 replacement. . Recent Labs  Lab 05/09/20 0538 05/10/20 0551 05/11/20 1044  HGB 7.2* 7.4* 7.2*  HCT 21.9* 23.1* 22.5*  WBC 1.7* 1.9* 2.1*  PLT 99* 89* 54*   Anemia  of chronic Disease/anemia B12 deficiency continue  replacement monitor.  Transfusing 1 unit PRBC as per hematology recommendation due to symptomatic anemia Recent Labs  Lab 05/07/20 0539 05/08/20 0539 05/09/20 0538 05/10/20 0551 05/11/20 1044  HGB 8.5* 8.1* 7.2* 7.4* 7.2*  HCT 26.7* 24.7* 21.9* 23.1* 22.5*   Hypokalemia: Slightly low this morning replacement ordered   Hypertension stable on losartan.  At times soft.  Monitor Hypoalbuminemia with leg edema.Augment nutrition.  Rash on the abdomen noted after bladder scan ? From gel allergy - added hydrocortisone cream as needed Benadryl.  Also has low platelet count which could contribute.  Monitor  Diet Order            Diet full liquid Room service appropriate? Yes; Fluid consistency: Thin  Diet effective now                 Nutrition Problem: Moderate Malnutrition Etiology: chronic illness,cancer and cancer related treatments Signs/Symptoms: percent weight loss,energy intake < or equal to 75% for > or equal to 1 month,mild fat depletion,mild muscle depletion Interventions: Boost Breeze,Refer to RD note for recommendations Patient's Body mass index is 26.88 kg/m.  DVT prophylaxis: Place TED hose Start: 05/02/20 1941 Code Status:   Code Status: DNR  Family Communication: plan of care discussed with patient at bedside. Discussed with palliative care and oncology team.  Status is: Inpatient Remains inpatient appropriate because:IV treatments appropriate due to intensity of illness or inability to take PO and Inpatient level of care appropriate due to severity of illness  Dispo: The patient is from: Home              Anticipated d/c is to: SNF  W/ palliative likely tody or tomorrow, awaiting 1 unit prbc transfusion              Patient currently is not medically stable to d/c.   Difficult to place patient No    Unresulted Labs (From admission, onward)          Start     Ordered   05/11/20 1140  Type and screen [place]  Once,   R       Comments:  [place]    05/11/20 1141   05/11/20 1139  Prepare RBC (crossmatch)  (Adult Blood Administration - Red Blood Cells)  Once,   R       Question Answer Comment  # of Units 1 unit   Transfusion Indications Symptomatic Anemia   Number of Units to Keep Ahead NO units ahead   If emergent release call blood bank Not emergent release      05/11/20 1141   05/10/20 0500  Methylmalonic acid, serum  Tomorrow morning,   R        05/09/20 2051   05/08/20 1633  Vitamin B1  ONCE - STAT,   STAT        05/08/20 1634   05/08/20 1633  Vitamin B6  ONCE - STAT,   STAT        05/08/20 1634   05/08/20 1633  Copper, serum  ONCE - STAT,   STAT        05/08/20 1634   05/08/20 1633  Zinc  ONCE - STAT,   STAT        05/08/20 1634   Pending  TSH  Once,   R       Question:  Specimen collection method  Answer:  Unit=Unit collect   Pending  Medications reviewed:  Scheduled Meds: . sodium chloride   Intravenous Once  . anastrozole  1 mg Oral Daily  . Chlorhexidine Gluconate Cloth  6 each Topical Daily  . cyanocobalamin  1,000 mcg Subcutaneous Q1400  . dronabinol  2.5 mg Oral QAC lunch  . feeding supplement  1 Container Oral TID BM  . furosemide  20 mg Oral Daily  . hydrocortisone cream   Topical BID  . losartan  12.5 mg Oral Daily  . ondansetron (ZOFRAN) IV  4 mg Intravenous Q8H  . pantoprazole sodium  40 mg Oral Daily  . potassium chloride  40 mEq Oral Once  . [START ON 05/12/2020] thiamine injection  100 mg Intravenous Q24H   Continuous Infusions: . thiamine injection 500 mg (05/11/20 8341)    Consultants:see note  Procedures:see note  Antimicrobials: Anti-infectives (From admission, onward)   Start     Dose/Rate Route Frequency Ordered Stop   05/06/20 1300  nitrofurantoin (macrocrystal-monohydrate) (MACROBID) capsule 100 mg        100 mg Oral Every 12 hours 05/06/20 1209 05/09/20 0959   05/02/20 1800  ampicillin (OMNIPEN) 1 g in sodium chloride 0.9 % 100 mL IVPB   Status:  Discontinued        1 g 300 mL/hr over 20 Minutes Intravenous Every 6 hours 05/02/20 1722 05/06/20 0955     Culture/Microbiology    Component Value Date/Time   SDES  04/30/2020 1300    URINE, CLEAN CATCH Performed at [facility], [place] [address], [place], [place] [address]    SPECREQUEST  04/30/2020 1300    NONE Performed at [facility], [place] [address] [place], [place] [address]    CULT 20,000 COLONIES/mL ENTEROCOCCUS FAECALIS (A) 04/30/2020 1300   REPTSTATUS 05/02/2020 FINAL 04/30/2020 1300    Other culture-see note  Objective: Vitals: Today's Vitals   05/10/20 2220 05/11/20 0422 05/11/20 0434 05/11/20 0823  BP:   (!) 132/48 (!) 108/47  Pulse:   94 91  Resp:   18 14  Temp:   99.5 F (37.5 C) 97.6 F (36.4 C)  TempSrc:   Oral Oral  SpO2:   94% 92%  Weight:      Height:      PainSc: 0-No pain 0-No pain      Intake/Output Summary (Last 24 hours) at 05/11/2020 1142 Last data filed at 05/11/2020 0943 Gross per 24 hour  Intake 416.75 ml  Output 1550 ml  Net -1133.25 ml   Filed Weights   05/02/20 1410  Weight: 82.6 kg   Weight change:   Intake/Output from previous day: 03/13 0701 - 03/14 0700 In: 386.8 [P.O.:180; IV Piggyback:206.8] Out: 1550 [Urine:1550] Intake/Output this shift: Total I/O In: 30 [P.O.:30] Out: -  Filed Weights   05/02/20 1410  Weight: 82.6 kg    Examination: General exam: AAOx3, ill looking and frail deconditioned. HEENT:Oral mucosa moist, Ear/Nose WNL grossly, dentition normal. Respiratory system: bilaterally diminishedd,no wheezing or crackles,no use of accessory muscle Cardiovascular system: S1 & S2 +, No JVD,. Gastrointestinal system: Abdomen soft, NT,ND, BS+ Nervous System:Alert, awake, moving extremities and grossly nonfocal Extremities: Bilateral ankle edema +- improving, distal peripheral pulses palpable.  Skin:No icterus. MSK: Normal muscle bulk,tone, power  Data  Reviewed: I have personally reviewed following labs and imaging studies CBC: Recent Labs  Lab 05/07/20 0539 05/08/20 0539 05/09/20 0538 05/10/20 0551 05/11/20 1044  WBC 1.6* 1.9* 1.7* 1.9* 2.1*  HGB 8.5* 8.1* 7.2* 7.4* 7.2*  HCT 26.7*  24.7* 21.9* 23.1* 22.5*  MCV 91.1 89.8 89.4 91.3 91.8  PLT 125* 116* 99* 89* 54*   Basic Metabolic Panel: Recent Labs  Lab 05/07/20 0539 05/08/20 0539 05/09/20 0538 05/09/20 1453 05/10/20 0551 05/11/20 1044  NA 138 138 141  --  140 141  K 3.9 3.4* 2.9* 2.8* 3.9 3.3*  CL 105 102 101  --  102 104  CO2 20* 22 29  --  28 26  GLUCOSE 105* 112* 97  --  91 109*  BUN 11 13 14   --  16 18  CREATININE 0.67 0.61 0.72  --  0.66 0.72  CALCIUM 8.7* 8.3* 8.2*  --  8.4* 8.2*  MG  --   --  1.8  --   --   --    GFR: Estimated Creatinine Clearance: 76.3 mL/min (by C-G formula based on SCr of 0.72 mg/dL). Liver Function Tests: Recent Labs  Lab 05/08/20 0539 05/09/20 0538 05/10/20 0551 05/11/20 1044  AST 107* 103* 118* 105*  ALT 45* 44 44 41  ALKPHOS 188* 177* 209* 227*  BILITOT 2.2* 2.1* 2.9* 2.5*  PROT 4.5* 4.1* 4.3* 4.2*  ALBUMIN 2.5* 2.3* 2.4* 2.4*   No results for input(s): LIPASE, AMYLASE in the last 168 hours. No results for input(s): AMMONIA in the last 168 hours. Coagulation Profile: No results for input(s): INR, PROTIME in the last 168 hours. Cardiac Enzymes: No results for input(s): CKTOTAL, CKMB, CKMBINDEX, TROPONINI in the last 168 hours. BNP (last 3 results) No results for input(s): PROBNP in the last 8760 hours. HbA1C: No results for input(s): HGBA1C in the last 72 hours. CBG: No results for input(s): GLUCAP in the last 168 hours. Lipid Profile: No results for input(s): CHOL, HDL, LDLCALC, TRIG, CHOLHDL, LDLDIRECT in the last 72 hours. Thyroid Function Tests: No results for input(s): TSH, T4TOTAL, FREET4, T3FREE, THYROIDAB in the last 72 hours. Anemia Panel: Recent Labs    05/10/20 0551  VITAMINB12 52*   Sepsis Labs: No  results for input(s): PROCALCITON, LATICACIDVEN in the last 168 hours.  Recent Results (from the past 240 hour(s))  SARS CORONAVIRUS 2 (TAT 6-24 HRS) Nasopharyngeal Nasopharyngeal Swab     Status: None   Collection Time: 05/02/20  7:15 PM   Specimen: Nasopharyngeal Swab  Result Value Ref Range Status   SARS Coronavirus 2 NEGATIVE NEGATIVE Final    Comment: (NOTE) SARS-CoV-2 target nucleic acids are NOT DETECTED.  The SARS-CoV-2 RNA is generally detectable in upper and lower respiratory specimens during the acute phase of infection. Negative results do not preclude SARS-CoV-2 infection, do not rule out co-infections with other pathogens, and should not be used as the sole basis for treatment or other patient management decisions. Negative results must be combined with clinical observations, patient history, and epidemiological information. The expected result is Negative.  Fact Sheet for Patients: SugarRoll.be  Fact Sheet for Healthcare Providers: https://www.woods-mathews.com/  This test is not yet approved or cleared by the [place] FDA and  has been authorized for detection and/or diagnosis of SARS-CoV-2 by FDA under an Emergency Use Authorization (EUA). This EUA will remain  in effect (meaning this test can be used) for the duration of the COVID-19 declaration under Se ction 564(b)(1) of the Act, 21 U.S.C. section 360bbb-3(b)(1), unless the authorization is terminated or revoked sooner.  Performed at [facility], [place] [address], [place], [place] [address]   SARS CORONAVIRUS 2 (TAT 6-24 HRS) Nasopharyngeal Nasopharyngeal Swab     Status: None  Collection Time: 05/09/20  1:58 PM   Specimen: Nasopharyngeal Swab  Result Value Ref Range Status   SARS Coronavirus 2 NEGATIVE NEGATIVE Final    Comment: (NOTE) SARS-CoV-2 target nucleic acids are NOT DETECTED.  The SARS-CoV-2 RNA is generally detectable in upper and  lower respiratory specimens during the acute phase of infection. Negative results do not preclude SARS-CoV-2 infection, do not rule out co-infections with other pathogens, and should not be used as the sole basis for treatment or other patient management decisions. Negative results must be combined with clinical observations, patient history, and epidemiological information. The expected result is Negative.  Fact Sheet for Patients: SugarRoll.be  Fact Sheet for Healthcare Providers: https://www.woods-mathews.com/  This test is not yet approved or cleared by the [place] FDA and  has been authorized for detection and/or diagnosis of SARS-CoV-2 by FDA under an Emergency Use Authorization (EUA). This EUA will remain  in effect (meaning this test can be used) for the duration of the COVID-19 declaration under Se ction 564(b)(1) of the Act, 21 U.S.C. section 360bbb-3(b)(1), unless the authorization is terminated or revoked sooner.  Performed at [facility], [place] [address] [place], [place] [address]      Radiology Studies: No results found.   LOS: 8 days   Antonieta Pert, MD [facility]  05/11/2020, 11:42 AM

## 2020-05-11 NOTE — Progress Notes (Signed)
Daily Progress Note   Patient Name: Brooke [name]       Date: 05/11/2020 DOB: Oct 12, 1950  Age: 70 y.o. MRN#: [id number] Attending Physician: Antonieta Pert, MD Primary Care Physician: [name], MD Admit Date: 05/02/2020  Reason for Consultation/Follow-up: Establishing goals of care  Subjective: I saw and examined Brooke [name] this afternoon.  She was awake and alert and is back to Brooke [name] baseline without confusion.  Discussed plan for transition to rehab and continuation of oral agents for cancer therapy.  Plan is for Brooke [name] to follow-up with Dr. [name] at the end of the week.  Emeri's family has traveled in from out of town as well.  With Brooke [name] permission, I met today with one of Brooke [name] sister's and Brooke [name] niece.  We dicussed Brooke [name] clinical course and continued changes that she has experienced in Brooke [name] nutrition and functional status.  We discussed plan to transition to [name] for trial of rehab.  Family is hopeful to be able to discuss further with Dr. [name].   Length of Stay: 8  Current Medications: Scheduled Meds:  . anastrozole  1 mg Oral Daily  . Chlorhexidine Gluconate Cloth  6 each Topical Daily  . cyanocobalamin  1,000 mcg Subcutaneous Q1400  . dronabinol  2.5 mg Oral QAC lunch  . feeding supplement  1 Container Oral TID BM  . furosemide  20 mg Oral Daily  . hydrocortisone cream   Topical BID  . losartan  12.5 mg Oral Daily  . ondansetron (ZOFRAN) IV  4 mg Intravenous Q8H  . pantoprazole sodium  40 mg Oral Daily  . tamsulosin  0.4 mg Oral Daily  . [START ON 05/12/2020] thiamine injection  100 mg Intravenous Q24H    Continuous Infusions:   PRN Meds: acetaminophen **OR** acetaminophen, diphenhydrAMINE, hydrALAZINE, metoCLOPramide (REGLAN) injection, prochlorperazine, Zinc  Oxide  Physical Exam         General: Awake, alert, chronically ill-appearing, Confused HEENT: No bruits, no goiter, no JVD Lungs: Good air movement, clear Abdomen: Soft, nondistended, positive bowel sounds.  Ext: Significant edema Skin: Warm and dry  Vital Signs: BP (!) 118/52   Pulse 92   Temp 97.7 F (36.5 C) (Oral)   Resp 20   Ht $R'5\' 9"'ct$  (1.753 m)   Wt 82.6 kg   LMP 03/18/1997   SpO2  96%   BMI 26.88 kg/m  SpO2: SpO2: 96 % O2 Device: O2 Device: Nasal Cannula O2 Flow Rate: O2 Flow Rate (L/min): 3 L/min  Intake/output summary:   Intake/Output Summary (Last 24 hours) at 05/11/2020 2312 Last data filed at 05/11/2020 1949 Gross per 24 hour  Intake 658.75 ml  Output 350 ml  Net 308.75 ml   LBM: Last BM Date: 05/10/20 Baseline Weight: Weight: 82.6 kg Most recent weight: Weight: 82.6 kg       Palliative Assessment/Data:      Patient Active Problem List   Diagnosis Date Noted  . B12 deficiency anemia 05/11/2020  . Pressure injury of skin 05/08/2020  . Malnutrition of moderate degree 05/05/2020  . Intractable vomiting with nausea 05/03/2020  . Acute lower UTI 04/21/2020  . Dehydration 04/21/2020  . Depression with anxiety 04/21/2020  . Transaminitis 04/21/2020  . Metastatic malignant neoplasm (HCC)   . Nausea vomiting and diarrhea   . Intractable nausea and vomiting 04/20/2020  . PICC (peripherally inserted central catheter) in place 04/17/2020  . Bone metastases (HCC) 03/11/2020  . Liver metastases (HCC) 03/05/2020  . Lung metastases (HCC) 03/05/2020  . Postoperative state 04/23/2019  . Superficial basal cell carcinoma (BCC) 02/10/2019  . Goals of care, counseling/discussion 12/14/2017  . Family history of uterine cancer   . Family history of prostate cancer   . Melanoma in situ of right upper extremity (HCC) 07/18/2017  . Port-A-Cath in place 04/18/2017  . Drug-induced neutropenia (HCC) 03/09/2017  . Family history of breast cancer 02/02/2017  . Family  history of colon cancer 02/02/2017  . Family history of prostate cancer in father 02/02/2017  . Malignant neoplasm of overlapping sites of left breast in female, estrogen receptor positive (HCC) 01/31/2017  . Hx of hot flashes, menopausal, HRT in the past 12/19/2011  . Osteopenia, stable, DEXA 2013, recs to repeat in 2018 12/19/2011  . Hx of Hyperlipidemia, LDL 144 in 07/2011 12/19/2011  . Vitamin D insufficiency 12/19/2011  . Fibroid     Palliative Care Assessment & Plan   Patient Profile: 70 y/o female with metastatic breast cancer followed by Dr. Darnelle Catalan who presented with n/v and generalized weakness. She was admitted a couple of weeks ago with similar presentation. Work-up consistent with UTI for which she is on appropriate antibiotics. Palliative care followed Brooke [name] during previous hospitalization and was re-consulted this admission for ongoing goal of care discussion and symptom management.   Recommendations/Plan:  DNR/DNI  I met today with Brooke [name] sister and niece and updated them on Brooke [name] condition and plan for trial of rehab.  Family is hopeful to be able to discuss with Brooke [name] primary oncologist, Dr. Darnelle Catalan, over the next day or two as well.  Plan for rehab on discharge and recommend palliative care follow-up at facility at time of discharge.  Goals of Care and Additional Recommendations:  Limitations on Scope of Treatment: Full Scope Treatment  Code Status:    Code Status Orders  (From admission, onward)         Start     Ordered   05/02/20 1941  Do not attempt resuscitation (DNR)  Continuous       Question Answer Comment  In the event of cardiac or respiratory ARREST Do not call a "code blue"   In the event of cardiac or respiratory ARREST Do not perform Intubation, CPR, defibrillation or ACLS   In the event of cardiac or respiratory ARREST Use medication by any route, position, wound care, and other  measures to relive pain and suffering. May use oxygen, suction and  manual treatment of airway obstruction as needed for comfort.   Comments Confirmed with patient      05/02/20 1940        Code Status History    Date Active Date Inactive Code Status Order ID Comments User Context   04/22/2020 1030 04/24/2020 2028 DNR [id number]  Micheline Rough, MD Inpatient   04/20/2020 1807 04/22/2020 1029 Full Code [id number]  Jonnie Finner, DO ED   04/23/2019 1126 04/24/2019 1408 Full Code [id number]  [name], MD Inpatient   04/23/2019 0751 04/23/2019 1126 Full Code [id number]  [name], MD Inpatient   09/25/2017 1520 09/26/2017 1836 Full Code [id number]  Jovita Kussmaul, MD Inpatient   03/02/2017 1542 03/03/2017 1242 Full Code [id number]  Jovita Kussmaul, MD Inpatient   Advance Care Planning Activity       Prognosis:   Unable to determine  Discharge Planning:  To Be Determined  Care plan was discussed with patient  Thank you for allowing the Palliative Medicine Team to assist in the care of this patient.  Time: 1050-1130  Total Time 45 Prolonged Time Billed No   Greater than 50%  of this time was spent counseling and coordinating care related to the above assessment and plan.  Micheline Rough, MD  Please contact Palliative Medicine Team phone at [phone] for questions and concerns.

## 2020-05-11 NOTE — Progress Notes (Signed)
  Speech Language Pathology Treatment: Dysphagia  Patient Details Name: Brooke [name] MRN: [id number] DOB: December 13, 1950 Today's Date: 05/11/2020 Time: 3875-6433 SLP Time Calculation (min) (ACUTE ONLY): 20 min  Assessment / Plan / Recommendation Clinical Impression  Pt seen at bedside for skilled ST intervention targeting goals for tolerance of least restrictive diet. Pt was awake, seated upright in bed. She reports tolerance of full liquids, and was observed drinking water without overt s/s aspiration. Pt verbalized willingness to try solid textures, and chose saltine crackers. She was observed to take tiny nibbles from the side of the cracker, and refused to accept a larger bolus (1/4 of a cracker at once). Pt would not be able to meet her nutritional needs with that size/rate of bolus intake. Recommend continuing full liquid, offering palatable soft solids as tolerated.    HPI HPI: 70 year old female with history of metastatic breast cancer, hypertension, anxiety presented with intractable nausea vomiting and generalized weakness. Dx of UTI. Brain MRI pending. Palliative on board      SLP Plan  Discharge SLP treatment due to goals met      Recommendations  Diet recommendations: Other(comment) (full liquid) Liquids provided via: Straw Medication Administration: Other (Comment) (liquid or IV) Supervision: Patient able to self feed Compensations: Slow rate;Small sips/bites Postural Changes and/or Swallow Maneuvers: Seated upright 90 degrees;Upright 30-60 min after meal                Oral Care Recommendations: Oral care BID Follow up Recommendations: None SLP Visit Diagnosis: Dysphagia, unspecified (R13.10) Plan: Discharge SLP treatment due to (comment)       GO               Brooke [name] B. Quentin Ore, [facility], [place] Speech Language Pathologist Office: [phone] Pager: [phone]  Shonna Chock 05/11/2020, 11:29 AM

## 2020-05-11 NOTE — Telephone Encounter (Signed)
Scheduled appts per 3/14 sch msg. Called pt, no answer. Left vm with appts date and times.

## 2020-05-11 NOTE — Progress Notes (Signed)
Physical Therapy Treatment Patient Details Name: Brooke [name] MRN: [id number] DOB: Apr 07, 1950 Today's Date: 05/11/2020    History of Present Illness 70 year old female with history of metastatic breast cancer, hypertension, anxiety presented with intractable nausea vomiting and generalized weakness. Dx of UTI.    PT Comments    General bed mobility comments: + 2 Max Asisst side to side rolling for hygiene as pt had loose uncontrolled watery stool.  Assisted with sitting EOB.  Once upright, pt able to static sit but Close Supervision but present with poor collasped posture.  Slightly improved head control though this session.  Pt sat EOB x 3 min before assisting her to [facility]. General transfer comment: From elevated bed to [facility] 1/4 pivot pt too weak to rise assisted via "Knee Block BEAR Hug" which once upright pt was able to briefly support her own weight but did present with B kbee buckle and required extra assist to complete pivot turn.  Also required assist to control decend to minimize "plop".  From [facility] same fashion back to bed as RN assisted with hygine during brief stance.  Pt profoundly weak.  Rec nursing use MAXI Move LIFT for any OOB actiivty.General Gait Details: non amb due to profound weakness Pt plans to D/C to [facility] Palliative Care.    Follow Up Recommendations  SNF     Equipment Recommendations  Wheelchair cushion (measurements PT);Wheelchair (measurements PT)    Recommendations for Other Services       Precautions / Restrictions Precautions Precautions: Fall Precaution Comments: SIRS    Mobility  Bed Mobility Overal bed mobility: Needs Assistance Bed Mobility: Rolling;Supine to Sit Rolling: Max assist   Supine to sit: Max assist;Total assist     General bed mobility comments: + 2 Max Asisst side to side rolling for hygiene as pt had loose uncontrolled watery stool.  Assisted with sitting EOB.  Once upright, pt able to static sit but Close Supervision but present with  poor collasped posture.  Slightly improved head control though this session.  Pt sat EOB x 3 min before assisting her to [facility].    Transfers Overall transfer level: Needs assistance Equipment used: None Transfers: Stand Pivot Transfers           General transfer comment: From elevated bed to [facility] 1/4 pivot pt too weak to rise assisted via "Knee Block BEAR Hug" which once upright pt was able to briefly support her own weight but did present with B kbee buckle and required extra assist to complete pivot turn.  Also required assist to control decend to minimize "plop".  From [facility] same fashion back to bed as RN assisted with hygine during brief stance.  Pt profoundly weak.  Rec nursing use MAXI Move LIFT for any OOB actiivty.  Ambulation/Gait             General Gait Details: non amb due to profound weakness   Stairs             Wheelchair Mobility    Modified Rankin (Stroke Patients Only)       Balance                                            Cognition Arousal/Alertness: Awake/alert Behavior During Therapy: Flat affect Overall Cognitive Status: No family/caregiver present to determine baseline cognitive functioning  General Comments: Pt AxO x 2 following repeat instructions but for most part is flat with minimal egagement and lack of interest      Exercises      General Comments        Pertinent Vitals/Pain Pain Assessment: Faces Faces Pain Scale: Hurts a little bit Pain Location: abdominal discomfort Pain Descriptors / Indicators: Aching;Grimacing Pain Intervention(s): Monitored during session;Premedicated before session    Home Living                      Prior Function            PT Goals (current goals can now be found in the care plan section) Progress towards PT goals: Progressing toward goals    Frequency    Min 3X/week      PT Plan Current plan remains  appropriate    Co-evaluation              AM-PAC PT "6 Clicks" Mobility   Outcome Measure  Help needed turning from your back to your side while in a flat bed without using bedrails?: Total Help needed moving from lying on your back to sitting on the side of a flat bed without using bedrails?: Total Help needed moving to and from a bed to a chair (including a wheelchair)?: Total Help needed standing up from a chair using your arms (e.g., wheelchair or bedside chair)?: Total Help needed to walk in hospital room?: Total Help needed climbing 3-5 steps with a railing? : Total 6 Click Score: 6    End of Session Equipment Utilized During Treatment: Gait belt Activity Tolerance: Patient limited by fatigue;Treatment limited secondary to medical complications (Comment) Patient left: in bed;with call bell/phone within reach;with bed alarm set Nurse Communication: Mobility status PT Visit Diagnosis: Difficulty in walking, not elsewhere classified (R26.2);Muscle weakness (generalized) (M62.81)     Time: 5916-3846 PT Time Calculation (min) (ACUTE ONLY): 40 min  Charges:  $Therapeutic Activity: 38-52 mins                     Rica Koyanagi  PTA Acute  Rehabilitation Services Pager      [phone] Office      [phone]

## 2020-05-11 NOTE — Progress Notes (Signed)
Brooke [name]   DOB:Apr 11, 1950   KJ#:[id number]   VXY#:[id number]  Subjective:  Brooke [name] was clear this morning, back to her usual self. She is focusing on swallowing issues, tells me she hates the B12 pill and wants to receive that subQ or IM. She is tolerating the anasrrozole with no side effects that she is aware of.    Objective: white woman examined in bed Vitals:   05/11/20 0434 05/11/20 0823  BP: (!) 132/48 (!) 108/47  Pulse: 94 91  Resp: 18 14  Temp: 99.5 F (37.5 C) 97.6 F (36.4 C)  SpO2: 94% 92%    Body mass index is 26.88 kg/m.  Intake/Output Summary (Last 24 hours) at 05/11/2020 0901 Last data filed at 05/11/2020 0330 Gross per 24 hour  Intake 386.75 ml  Output 1550 ml  Net -1163.25 ml     CBG (last 3)  No results for input(s): GLUCAP in the last 72 hours.   Labs:  Lab Results  Component Value Date   WBC 1.9 (L) 05/10/2020   HGB 7.4 (L) 05/10/2020   HCT 23.1 (L) 05/10/2020   MCV 91.3 05/10/2020   PLT 89 (L) 05/10/2020   NEUTROABS 4.6 04/30/2020    _0 @  Urine Studies No results for input(s): UHGB, CRYS in the last 72 hours.  Invalid input(s): UACOL, UAPR, USPG, UPH, UTP, UGL, [place], UBIL, UNIT, UROB, [place], UEPI, UWBC, [place]wayne Heck [place], [place]  Basic Metabolic Panel: Recent Labs  Lab 05/05/20 0459 05/07/20 0539 05/08/20 0539 05/09/20 0538 05/09/20 1453 05/10/20 0551  NA 138 138 138 141  --  140  K 3.4* 3.9 3.4* 2.9* 2.8* 3.9  CL 105 105 102 101  --  102  CO2 20* 20* 22 29  --  28  GLUCOSE 98 105* 112* 97  --  91  BUN _1 --  16  CREATININE 0.54 0.67 0.61 0.72  --  0.66  CALCIUM 7.9* 8.7* 8.3* 8.2*  --  8.4*  MG  --   --   --  1.8  --   --    GFR Estimated Creatinine Clearance: 76.3 mL/min (by C-G formula based on SCr of 0.66 mg/dL). Liver Function Tests: Recent Labs  Lab 05/08/20 0539 05/09/20 0538 05/10/20 0551  AST 107* 103* 118*  ALT 45* 44 44  ALKPHOS 188* 177* 209*  BILITOT 2.2* 2.1* 2.9*  PROT 4.5*  4.1* 4.3*  ALBUMIN 2.5* 2.3* 2.4*   No results for input(s): LIPASE, AMYLASE in the last 168 hours. No results for input(s): AMMONIA in the last 168 hours. Coagulation profile No results for input(s): INR, PROTIME in the last 168 hours.  CBC: Recent Labs  Lab 05/07/20 0539 05/08/20 0539 05/09/20 0538 05/10/20 0551  WBC 1.6* 1.9* 1.7* 1.9*  HGB 8.5* 8.1* 7.2* 7.4*  HCT 26.7* 24.7* 21.9* 23.1*  MCV 91.1 89.8 89.4 91.3  PLT 125* 116* 99* 89*   Cardiac Enzymes: No results for input(s): CKTOTAL, CKMB, CKMBINDEX, TROPONINI in the last 168 hours. BNP: Invalid input(s): POCBNP CBG: No results for input(s): GLUCAP in the last 168 hours. D-Dimer No results for input(s): DDIMER in the last 72 hours. Hgb A1c No results for input(s): HGBA1C in the last 72 hours. Lipid Profile No results for input(s): CHOL, HDL, LDLCALC, TRIG, CHOLHDL, LDLDIRECT in the last 72 hours. Thyroid function studies No results for input(s): TSH, T4TOTAL, T3FREE, THYROIDAB in the last 72 hours.  Invalid input(s): FREET3 Anemia work up [employer]  05/10/20 0551  [id number] 52*   Microbiology Recent Results (from the past 240 hour(s))  SARS CORONAVIRUS 2 (TAT 6-24 HRS) Nasopharyngeal Nasopharyngeal Swab     Status: None   Collection Time: 05/02/20  7:15 PM   Specimen: Nasopharyngeal Swab  Result Value Ref Range Status   SARS Coronavirus 2 NEGATIVE NEGATIVE Final    Comment: (NOTE) SARS-CoV-2 target nucleic acids are NOT DETECTED.  The SARS-CoV-2 RNA is generally detectable in upper and lower respiratory specimens during the acute phase of infection. Negative results do not preclude SARS-CoV-2 infection, do not rule out co-infections with other pathogens, and should not be used as the sole basis for treatment or other patient management decisions. Negative results must be combined with clinical observations, patient history, and epidemiological information. The expected result is  Negative.  Fact Sheet for Patients: SugarRoll.be  Fact Sheet for Healthcare Providers: https://www.woods-mathews.com/  This test is not yet approved or cleared by the [place] FDA and  has been authorized for detection and/or diagnosis of SARS-CoV-2 by FDA under an Emergency Use Authorization (EUA). This EUA will remain  in effect (meaning this test can be used) for the duration of the COVID-19 declaration under Se ction 564(b)(1) of the Act, 21 U.S.C. section 360bbb-3(b)(1), unless the authorization is terminated or revoked sooner.  Performed at [facility], [place] [address], [place], [place] [address]   SARS CORONAVIRUS 2 (TAT 6-24 HRS) Nasopharyngeal Nasopharyngeal Swab     Status: None   Collection Time: 05/09/20  1:58 PM   Specimen: Nasopharyngeal Swab  Result Value Ref Range Status   SARS Coronavirus 2 NEGATIVE NEGATIVE Final    Comment: (NOTE) SARS-CoV-2 target nucleic acids are NOT DETECTED.  The SARS-CoV-2 RNA is generally detectable in upper and lower respiratory specimens during the acute phase of infection. Negative results do not preclude SARS-CoV-2 infection, do not rule out co-infections with other pathogens, and should not be used as the sole basis for treatment or other patient management decisions. Negative results must be combined with clinical observations, patient history, and epidemiological information. The expected result is Negative.  Fact Sheet for Patients: SugarRoll.be  Fact Sheet for Healthcare Providers: https://www.woods-mathews.com/  This test is not yet approved or cleared by the [place] FDA and  has been authorized for detection and/or diagnosis of SARS-CoV-2 by FDA under an Emergency Use Authorization (EUA). This EUA will remain  in effect (meaning this test can be used) for the duration of the COVID-19 declaration under Se ction 564(b)(1) of  the Act, 21 U.S.C. section 360bbb-3(b)(1), unless the authorization is terminated or revoked sooner.  Performed at [facility], [place] [address], [place], [place] [address]       Studies:  No results found.  Assessment: 70 y.o. 70 y.o.[place] woman status post bilateral biopsies 01/26/2017, showing  (1) in the right breast, a complex sclerosing lesion, status post lumpectomy 09/25/2017, with no malignancy identified.  (2) in the left breast, a cT2 pN1 invasive ductal carcinoma (with some lobular features but E-cadherin positive), grade 2, estrogen and progesterone receptor positive, HER-2 not amplified, with an MIB-1 of 15-20% (a) breast MRI 02/04/2017 suggests a T3 N1 tumor  (3) started neoadjuvant cyclophosphamide/docetaxel01/05/2017, discontinuedafter 1 cycle with very poor tolerance (a) started cyclophosphamide/methotrexate/fluorouracil(CMF) 03/28/2017, repeated x7 cycles, last dose 08/01/2017  (4) status postleft modified radical mastectomy7/29/19 for a ypT5 ypN2, stage IIIAinvasive ductal carcinoma, grade 2, again estrogen and progesterone receptor positive, HER-2 not amplified  (5) adjuvant radiation completed  12/21/2017 (a) capecitabinesensitization tolerated only the initial week of radiation (b) Site/dose:The patient initially received a dose of 50.4 Gy in 28 fractions to the leftchest wall andleftsupraclavicular region. This was delivered using a 3-D conformal, 4 field technique. The patient then received a boost to the mastectomy scar. This delivered an additional 10 Gy in 5 fractions using an en face electron field. The total dose was 60.4 Gy.  ( 6)tamoxifen started 03/09/2018, discontinued January 2022 with metastatic progression  (7) genetics testing 12/12/2017 through the Multi-Gene Panel offered by [facility] found no deleterious mutationsin AIP, ALK, APC, ATM, AXIN2,BAP1, BARD1, BLM, BMPR1A, BRCA1, BRCA2,  BRIP1, CASR, CDC73, CDH1, CDK4, CDKN1B, CDKN1C, CDKN2A (p14ARF), CDKN2A (p16INK4a), CEBPA, CHEK2, CTNNA1, DICER1, DIS3L2, EGFR (c.2369C>T, p.Thr790Met variant only), EPCAM (Deletion/duplication testing only), FH, FLCN, GATA2, GPC3, GREM1 (Promoter region deletion/duplication testing only), HOXB13 (c.251G>A, p.Gly84Glu), HRAS, KIT, MAX, MEN1, MET, MITF (c.952G>A, p.Glu318Lys variant only), MLH1, MSH2, MSH3, MSH6, MUTYH, NBN, NF1, NF2, NTHL1, PALB2, PDGFRA, PHOX2B, PMS2, POLD1, POLE, POT1, PRKAR1A, PTCH1, PTEN, RAD50, RAD51C, RAD51D, RB1, RECQL4, RET, RUNX1, SDHAF2, SDHA (sequence changes only), SDHB, SDHC, SDHD, SMAD4, SMARCA4, SMARCB1, SMARCE1, STK11, SUFU, TERC, TERT, TMEM127, TP53, TSC1, TSC2, VHL, WRN and WT1.   (8) Foundation 1 testing shows stable microsatellite status and 1 mutation/ Mb, TP20mutations x2 (a) PD-L1 dated July 2019 negative  METASTATIC DISEASE: January 2022 (9) CT angio of the chest 02/27/2020 shows multiple hilar nodes consistent with prior granulomatous infection, multiple small pulmonary nodules, multiple punctate granulomas throughout the liver and spleen, nodular hepatic contour and areas of mottling possibly due to steatosis, 1.6 cm left adrenal nodule (a) PET scan 03/05/2020 shows mildly to highly hypermetabolic adenopathy, the pulmonary nodules being mildly to not hypermetabolic, a large right hepatic liver lesion with an SUV of 13.2 and additional hypermetabolic right and left lobe foci, the adrenal lesion being hypermetabolic with an SUV of 16. There are also peritoneal nodules and innumerable foci of bony involvement. Incidental note was made of small heterogeneous foci of increased uptake in the cerebellum. The CT images did not show gross abnormality in the visualized brain (b) on 03/09/2020 CA 27-29 was 983.0, CEA 744.17, the CA 19-9 normal at 32 (c) liver biopsy 03/13/2020 confirms metastati carcinoma, estrogen  receptor positive, progesterone and HER 2 negative, with an Mib-1 of 40% (d) brain MRI 03/26/2020 shows dural involvement including a 1.0 cm dural-based cerebellar lesion  (10) paclitaxel started 03/30/2020, discontinued after three doses (last dose03/04/2020) with no clinical improvement  (a) CA27-29 increased despite taxol treatment  (11) zoledronate started 03/19/2020, to be repeated every 12 weeks  (12) anastrozole started 05/07/2020  (a) palbociclib to be added once counts recover  Plan:  Neida's confusion last Friday has cleared. She is tolerating anastrozole well and the plan is to continue that and add palbociclib when she returns to see me 1 PM this Friday 03/18. She asks that her B-12 be changed to parenterally and we will be glad to do that given her swallowing difficulties.  Her family (older sister Collie Siad and patient's niece) supposedly arrived last night. She has not yet spoken with them. I gave nary my card with instructions on how the family can contact me. If she is not discharged today I will meet with them tomorrow Tuesday 3/14 at 8 AM in the patient's room. Otherwise they will call and I will  Discuss the situation with them as convenient for them  I will follow up on the TSH and other labs when patient sees  me 3/18  Thank you for your help to this patient!  Chauncey Cruel, MD 05/11/2020  9:01 AM Medical Oncology and Hematology [facility] [address] [place], [place] [address] Tel. [phone]    Fax. [phone]

## 2020-05-11 NOTE — Care Management Important Message (Signed)
Important Message  Patient Details IM Letter given to the Patient.  Name: ANAKA BEAZER MRN: [id number] Date of Birth: 1950/08/15   Medicare Important Message Given:  Yes     Kerin Salen 05/11/2020, 11:05 AM

## 2020-05-11 NOTE — Progress Notes (Signed)
[id number]  [facility]) Hospital Liaison note:  This is a pending outpatient based Palliative care patient with [facility]. Will continue to follow for disposition.  Please call for any outpatient palliative care related questions or concerns.  Thank you, Margaretmary Eddy, BSN, RN [facility] Liaison [phone]

## 2020-05-12 ENCOUNTER — Inpatient Hospital Stay (HOSPITAL_BASED_OUTPATIENT_CLINIC_OR_DEPARTMENT_OTHER): Payer: Medicare PPO | Admitting: Oncology

## 2020-05-12 ENCOUNTER — Other Ambulatory Visit: Payer: Self-pay | Admitting: Oncology

## 2020-05-12 DIAGNOSIS — Z8249 Family history of ischemic heart disease and other diseases of the circulatory system: Secondary | ICD-10-CM | POA: Diagnosis not present

## 2020-05-12 DIAGNOSIS — Z170 Estrogen receptor positive status [ER+]: Secondary | ICD-10-CM | POA: Diagnosis not present

## 2020-05-12 DIAGNOSIS — Z79899 Other long term (current) drug therapy: Secondary | ICD-10-CM | POA: Diagnosis not present

## 2020-05-12 DIAGNOSIS — M2550 Pain in unspecified joint: Secondary | ICD-10-CM | POA: Diagnosis not present

## 2020-05-12 DIAGNOSIS — C787 Secondary malignant neoplasm of liver and intrahepatic bile duct: Secondary | ICD-10-CM | POA: Diagnosis present

## 2020-05-12 DIAGNOSIS — Z8261 Family history of arthritis: Secondary | ICD-10-CM | POA: Diagnosis not present

## 2020-05-12 DIAGNOSIS — Z803 Family history of malignant neoplasm of breast: Secondary | ICD-10-CM | POA: Diagnosis not present

## 2020-05-12 DIAGNOSIS — Z741 Need for assistance with personal care: Secondary | ICD-10-CM | POA: Diagnosis not present

## 2020-05-12 DIAGNOSIS — F419 Anxiety disorder, unspecified: Secondary | ICD-10-CM | POA: Diagnosis not present

## 2020-05-12 DIAGNOSIS — Z808 Family history of malignant neoplasm of other organs or systems: Secondary | ICD-10-CM | POA: Diagnosis not present

## 2020-05-12 DIAGNOSIS — L89314 Pressure ulcer of right buttock, stage 4: Secondary | ICD-10-CM | POA: Diagnosis not present

## 2020-05-12 DIAGNOSIS — L89324 Pressure ulcer of left buttock, stage 4: Secondary | ICD-10-CM | POA: Diagnosis not present

## 2020-05-12 DIAGNOSIS — Z8042 Family history of malignant neoplasm of prostate: Secondary | ICD-10-CM | POA: Diagnosis not present

## 2020-05-12 DIAGNOSIS — A1813 Tuberculosis of other urinary organs: Secondary | ICD-10-CM | POA: Diagnosis not present

## 2020-05-12 DIAGNOSIS — E519 Thiamine deficiency, unspecified: Secondary | ICD-10-CM | POA: Diagnosis not present

## 2020-05-12 DIAGNOSIS — E46 Unspecified protein-calorie malnutrition: Secondary | ICD-10-CM | POA: Diagnosis not present

## 2020-05-12 DIAGNOSIS — M8580 Other specified disorders of bone density and structure, unspecified site: Secondary | ICD-10-CM | POA: Diagnosis not present

## 2020-05-12 DIAGNOSIS — R339 Retention of urine, unspecified: Secondary | ICD-10-CM | POA: Diagnosis not present

## 2020-05-12 DIAGNOSIS — Z79811 Long term (current) use of aromatase inhibitors: Secondary | ICD-10-CM | POA: Diagnosis not present

## 2020-05-12 DIAGNOSIS — Z515 Encounter for palliative care: Secondary | ICD-10-CM | POA: Diagnosis not present

## 2020-05-12 DIAGNOSIS — Z833 Family history of diabetes mellitus: Secondary | ICD-10-CM | POA: Diagnosis not present

## 2020-05-12 DIAGNOSIS — D649 Anemia, unspecified: Secondary | ICD-10-CM | POA: Diagnosis not present

## 2020-05-12 DIAGNOSIS — G9340 Encephalopathy, unspecified: Secondary | ICD-10-CM | POA: Diagnosis not present

## 2020-05-12 DIAGNOSIS — C799 Secondary malignant neoplasm of unspecified site: Secondary | ICD-10-CM | POA: Diagnosis not present

## 2020-05-12 DIAGNOSIS — Z85820 Personal history of malignant melanoma of skin: Secondary | ICD-10-CM | POA: Diagnosis not present

## 2020-05-12 DIAGNOSIS — C7802 Secondary malignant neoplasm of left lung: Secondary | ICD-10-CM | POA: Diagnosis present

## 2020-05-12 DIAGNOSIS — D519 Vitamin B12 deficiency anemia, unspecified: Secondary | ICD-10-CM | POA: Diagnosis not present

## 2020-05-12 DIAGNOSIS — E876 Hypokalemia: Secondary | ICD-10-CM | POA: Diagnosis present

## 2020-05-12 DIAGNOSIS — L8990 Pressure ulcer of unspecified site, unspecified stage: Secondary | ICD-10-CM | POA: Diagnosis not present

## 2020-05-12 DIAGNOSIS — Z20822 Contact with and (suspected) exposure to covid-19: Secondary | ICD-10-CM | POA: Diagnosis present

## 2020-05-12 DIAGNOSIS — R0902 Hypoxemia: Secondary | ICD-10-CM | POA: Diagnosis not present

## 2020-05-12 DIAGNOSIS — R2689 Other abnormalities of gait and mobility: Secondary | ICD-10-CM | POA: Diagnosis not present

## 2020-05-12 DIAGNOSIS — C50812 Malignant neoplasm of overlapping sites of left female breast: Secondary | ICD-10-CM

## 2020-05-12 DIAGNOSIS — R1310 Dysphagia, unspecified: Secondary | ICD-10-CM | POA: Diagnosis present

## 2020-05-12 DIAGNOSIS — M6281 Muscle weakness (generalized): Secondary | ICD-10-CM | POA: Diagnosis not present

## 2020-05-12 DIAGNOSIS — E440 Moderate protein-calorie malnutrition: Secondary | ICD-10-CM | POA: Diagnosis present

## 2020-05-12 DIAGNOSIS — Z66 Do not resuscitate: Secondary | ICD-10-CM | POA: Diagnosis present

## 2020-05-12 DIAGNOSIS — Z800 Family history of malignant neoplasm of digestive organs: Secondary | ICD-10-CM | POA: Diagnosis not present

## 2020-05-12 DIAGNOSIS — N390 Urinary tract infection, site not specified: Secondary | ICD-10-CM | POA: Diagnosis not present

## 2020-05-12 DIAGNOSIS — I10 Essential (primary) hypertension: Secondary | ICD-10-CM | POA: Diagnosis present

## 2020-05-12 DIAGNOSIS — C7989 Secondary malignant neoplasm of other specified sites: Secondary | ICD-10-CM | POA: Diagnosis not present

## 2020-05-12 DIAGNOSIS — Z452 Encounter for adjustment and management of vascular access device: Secondary | ICD-10-CM | POA: Diagnosis present

## 2020-05-12 DIAGNOSIS — L89154 Pressure ulcer of sacral region, stage 4: Secondary | ICD-10-CM | POA: Diagnosis not present

## 2020-05-12 DIAGNOSIS — Z7401 Bed confinement status: Secondary | ICD-10-CM | POA: Diagnosis not present

## 2020-05-12 DIAGNOSIS — D696 Thrombocytopenia, unspecified: Secondary | ICD-10-CM | POA: Diagnosis not present

## 2020-05-12 DIAGNOSIS — R600 Localized edema: Secondary | ICD-10-CM | POA: Diagnosis not present

## 2020-05-12 DIAGNOSIS — D638 Anemia in other chronic diseases classified elsewhere: Secondary | ICD-10-CM | POA: Diagnosis not present

## 2020-05-12 DIAGNOSIS — C50912 Malignant neoplasm of unspecified site of left female breast: Secondary | ICD-10-CM | POA: Diagnosis present

## 2020-05-12 DIAGNOSIS — Z8049 Family history of malignant neoplasm of other genital organs: Secondary | ICD-10-CM | POA: Diagnosis not present

## 2020-05-12 DIAGNOSIS — C7951 Secondary malignant neoplasm of bone: Secondary | ICD-10-CM | POA: Diagnosis present

## 2020-05-12 DIAGNOSIS — C50919 Malignant neoplasm of unspecified site of unspecified female breast: Secondary | ICD-10-CM | POA: Diagnosis not present

## 2020-05-12 DIAGNOSIS — Z7189 Other specified counseling: Secondary | ICD-10-CM | POA: Diagnosis not present

## 2020-05-12 DIAGNOSIS — R112 Nausea with vomiting, unspecified: Secondary | ICD-10-CM | POA: Diagnosis not present

## 2020-05-12 DIAGNOSIS — C773 Secondary and unspecified malignant neoplasm of axilla and upper limb lymph nodes: Secondary | ICD-10-CM | POA: Diagnosis not present

## 2020-05-12 DIAGNOSIS — R5381 Other malaise: Secondary | ICD-10-CM | POA: Diagnosis not present

## 2020-05-12 DIAGNOSIS — R2681 Unsteadiness on feet: Secondary | ICD-10-CM | POA: Diagnosis not present

## 2020-05-12 DIAGNOSIS — C7932 Secondary malignant neoplasm of cerebral meninges: Secondary | ICD-10-CM | POA: Diagnosis present

## 2020-05-12 DIAGNOSIS — Z9012 Acquired absence of left breast and nipple: Secondary | ICD-10-CM | POA: Diagnosis not present

## 2020-05-12 LAB — SARS CORONAVIRUS 2 (TAT 6-24 HRS): SARS Coronavirus 2: NEGATIVE

## 2020-05-12 LAB — CBC
HCT: 25.4 % — ABNORMAL LOW (ref 36.0–46.0)
Hemoglobin: 8.2 g/dL — ABNORMAL LOW (ref 12.0–15.0)
MCH: 29.6 pg (ref 26.0–34.0)
MCHC: 32.3 g/dL (ref 30.0–36.0)
MCV: 91.7 fL (ref 80.0–100.0)
Platelets: 47 10*3/uL — ABNORMAL LOW (ref 150–400)
RBC: 2.77 MIL/uL — ABNORMAL LOW (ref 3.87–5.11)
RDW: 22.5 % — ABNORMAL HIGH (ref 11.5–15.5)
WBC: 2.6 10*3/uL — ABNORMAL LOW (ref 4.0–10.5)
nRBC: 5 % — ABNORMAL HIGH (ref 0.0–0.2)

## 2020-05-12 LAB — METHYLMALONIC ACID, SERUM: Methylmalonic Acid, Quantitative: 200 nmol/L (ref 0–378)

## 2020-05-12 LAB — TYPE AND SCREEN
ABO/RH(D): A POS
Antibody Screen: NEGATIVE
Unit division: 0

## 2020-05-12 LAB — BPAM RBC
Blood Product Expiration Date: 202204092359
ISSUE DATE / TIME: 202203141642
Unit Type and Rh: 6200

## 2020-05-12 MED ORDER — HYDROCORTISONE 1 % EX CREA: TOPICAL_CREAM | Freq: Two times a day (BID) | CUTANEOUS | 0 refills | Status: AC

## 2020-05-12 MED ORDER — FUROSEMIDE 20 MG PO TABS: 20.0000 mg | ORAL_TABLET | ORAL | Status: DC

## 2020-05-12 NOTE — Discharge Summary (Addendum)
Physician Discharge Summary  KAMARRI FISCHETTI YHC:[id number] DOB: Mar 22, 1950 DOA: 05/02/2020  PCP: [name], MD  Admit date: 05/02/2020 Discharge date: 05/12/2020  Admitted From: home Disposition:  SNF  Recommendations for Outpatient Follow-up:  1. Follow up with oncology coming Friday  2. Please obtain BMP/CBC in 5-7 days  Home Health:no  Equipment/Devices: none  Discharge Condition: Stable Code Status:   Code Status: DNR Diet recommendation:  Diet Order            Diet - low sodium heart healthy           Diet full liquid           Diet full liquid Room service appropriate? Yes; Fluid consistency: Thin  Diet effective now                Brief/Interim Summary: 70 year old female with metastatic breast cancer, hypertension anxiety disorder anemia admitted with intractable nausea vomiting and generalized weakness.  She follows with Dr. [name] for chemotherapy and cancer care.  After her recent appointment she went home but unable to get up from chair was weak and brought to the ED, found to have UTI recently as well.Patient was admitted with intermittent nausea vomiting and placed on IV fluids in the ED.She is followed by PT OT for generalized deconditioning weakness. Patient was treated for intractable nausea vomiting and dehydration with lactic acidosis with poor oral intake in the setting of UTI metastatic disease.  She was seen by oncology. PT has recommended skilled nursing facility.  At this time tolerating p.o although having some nausea. Transfuse 1 unit PRBC 3/14 and monitored additional day.   Seen by oncology this morning and okay to discharge to skilled nursing facility with outpatient follow-up in next 2 day In the clinic  Discharge Diagnoses:  Intractable nausea and vomiting Generalized weakness/deconditioning Dry mouth Dehydration with lactic acidosis from poor po ijntake Multifactorial in setting of metastatic disease, UTI, B12 deficiency. seen by  oncology.  Recommended skilled nursing facility. No obstructive bowel gas pattern In xray has been tolerating full liquid diet-at times nauseous but has low appetite placed on Marinol, status post high-dose thiamine and B12 replacement.  Continue to follow with dietitian continue on nutritional supplement   Lower extremity edema b/l echo with normal EF.  Likely due to her low albumin, deconditioning and immobility.  Status post IV Lasix with good negative balance and swelling much better.  Can continue low-dose Lasix.  Elevate leg augment diet.   Net IO Since Admission: -8,711.38 mL [05/12/20 1645]  B12 deficiency at 37.  Continue with 1000 MCG SQ daily x3 doses then orally-if not able to take orally Dr. [name] will dose with injection in the clinic.   UTI with 20,000 colonies of Enterococcus faecalis Acute urine retention needing Foley Patient completed Unasyn 5 days then nitrofurantoin.  Foley was removed but failed voiding trial needing in and out cath, Foley back in place continue the same and outpatient voiding trial in 10 days or so at [facility].  Placed on Flomax.  Metastatic breast cancer with the spinal mets MRI of cervical thoracic lumbar spine showed diffuse osseous metastatic disease.She has had #3 of paclitaxel on 04/30/20. 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  Patient to follow-up with oncology next 2 days or so for ongoing discussion about further plan of care and goals of care as per Dr Achille Rich is going to return to see me on 05/15/2020.  I will call [name] once she has been transferred and let them  know that she needs to be transported here that day.  Hopefully we can add palbociclib at that time.  She will then need to see me about 3 weeks later to reassess."  Acute metabolic encephalopathy she is alert awake and oriented at baseline, she wants appreciate the skilled nursing facility today.  Cont supportive care,delirium precautions,PT/OT and SNF.  Pancytopenia with leukopenia thrombocytopenia and anemia due to chemotherapy and B12 deficiency:WBC count improving at 2.6K now hemoglobin is stable after transfusion, platelet low at 40 7K repeat CBC in 1 week.  Follow-up with oncology.  Continue B12 replacement.   Recent Labs  Lab 05/10/20 0551 05/11/20 1044 05/12/20 0807  HGB 7.4* 7.2* 8.2*  HCT 23.1* 22.5* 25.4*  WBC 1.9* 2.1* 2.6*  PLT 89* 54* 47*   Anemia of chronic Disease/anemia B12 deficiency continue replacement albuterol, s/p 1 unit PRBC 3/14 hemoglobin improved to 8.2 g.  Recent Labs  Lab 05/08/20 0539 05/09/20 0538 05/10/20 0551 05/11/20 1044 05/12/20 0807  HGB 8.1* 7.2* 7.4* 7.2* 8.2*  HCT 24.7* 21.9* 23.1* 22.5* 25.4*   Hypokalemia: Improved with replacement.    Hypertension stable on losartan.  At times soft.  Monitor Hypoalbuminemia with leg edema.Augment nutrition.  Rash on the abdomen noted after bladder scan ? From gel allergy - added hydrocortisone cream as needed Benadryl.  Stable could be due to thrombocytopenia.  Monitor CBC  Goals of care patient is DNR.  Overall very guarded/poor prognosis, she will follow up with oncology on outpatient basis to decide further plan of care, she will benefit with ongoing discussion about palliative care. She is high risk for readmission.  Remains ill frail and deconditioned.  Nutrition Problem: Moderate Malnutrition Etiology: chronic illness,cancer and cancer related treatments Signs/Symptoms: percent weight loss,energy intake < or equal to 75% for > or equal to 1 month,mild fat depletion,mild muscle  depletion Interventions: Boost Breeze,Refer to RD note for recommendations Patient's Body mass index is 26.88 kg/m.  Pressure Ulcer: Pressure Injury 05/07/20 Buttocks Left Stage 2 -  Partial thickness loss of dermis presenting as a shallow open injury with a red, pink wound bed without slough. 4 small circular wounds L inner buttocks (Active)  05/07/20 1130  Location: Buttocks  Location Orientation: Left  Staging: Stage 2 -  Partial thickness loss of dermis presenting as a shallow open injury with a red, pink wound bed without slough.  Wound Description (Comments): 4 small circular wounds L inner buttocks  Present on Admission:      Pressure Injury 05/07/20 Buttocks Right open area wound bed yellow (Active)  05/07/20 1130  Location: Buttocks  Location Orientation: Right  Staging:   Wound Description (Comments): open area wound bed yellow  Present on Admission:     Consults:  Hematology oncology, palliative care  Subjective: Alert awake oriented this morning, agreeable for discharge to skilled nursing facility today, seen by hematology. Discharge Exam: Vitals:   05/11/20 1955 05/12/20 0437  BP: (!) 118/52 124/60  Pulse: 92 92  Resp: 20 16  Temp: 97.7 F (36.5 C) 97.7 F (36.5 C)  SpO2: 96% 92%   General: Pt is alert, awake, not in acute distress Cardiovascular:  RRR, S1/S2 +, no rubs, no gallops Respiratory: CTA bilaterally, no wheezing, no rhonchi Abdominal: Soft, NT, ND, bowel sounds + Extremities: no edema, no cyanosis  Discharge Instructions  Discharge Instructions    Diet - low sodium heart healthy   Complete by: As directed    Diet full liquid   Complete by: As directed    Discharge instructions   Complete by: As directed    Please call call MD or return to ER for similar or worsening recurring problem that brought you to hospital or if any fever,nausea/vomiting,abdominal pain, uncontrolled pain, chest pain,  shortness of breath or any other alarming  symptoms.  Please follow-up your doctor as instructed in a week , follow up with Dr [name] coming Friday and call the office for appointment.  Please avoid alcohol, smoking, or any other illicit substance and maintain healthy habits including taking your regular medications as prescribed.  You were cared for by a hospitalist during your hospital stay. If you have any questions about your discharge medications or the care you received while you were in the hospital after you are discharged, you can call the unit and ask to speak with the hospitalist on call if the hospitalist that took care of you is not available.  Once you are discharged, your primary care physician will handle any further medical issues. Please note that NO REFILLS for any discharge medications will be authorized once you are discharged, as it is imperative that you return to your primary care physician (or establish a relationship with a primary care physician if you do not have one) for your aftercare needs so that they can reassess your need for medications and monitor your lab values   Discharge instructions   Complete by: As directed    Please follow-up with Dr. [name]  as instructed.  Ensure that you are drinking and eating well  Please call call MD or return to ER for similar or worsening recurring problem that brought you to hospital or if any fever,nausea/vomiting,abdominal pain, uncontrolled pain, chest pain,  shortness of breath or any other alarming symptoms.  Please follow-up your doctor as instructed in a week time and call the office for appointment.  Please avoid alcohol, smoking, or any other illicit substance and maintain healthy habits including taking your regular medications as prescribed.  You were cared for by a hospitalist during your hospital stay. If you have any questions about your discharge medications or the care you received while you were in the hospital after you are discharged, you can  call the unit and ask to speak with the hospitalist on call if the hospitalist that took care of you is not available.  Once you are discharged, your primary care physician will handle any further medical issues. Please note that NO REFILLS for any discharge medications will be authorized once you are discharged, as it is imperative that you return to your primary care physician (or establish a relationship with a primary care physician if you do not have one) for your aftercare needs so that they can reassess your need for medications and monitor your lab values   Discharge wound care:   Complete by: As directed    Cleanse buttocks with Soap and water and pat dry. Apply Zinc ointment twice daily and PRN soilage. No disposable briefs.  Dermatherapy linen will wick moisture and improve skin microclimate.   Discharge wound care:   Complete by: As directed    Wound care  Every shift  Comments: Cleanse buttocks with Soap and water and pat dry. Apply Zinc ointment twice daily and PRN soilage. No disposable briefs.  Dermatherapy linen will wick moisture and improve skin microclimate.   Increase activity slowly   Complete by: As directed    Increase activity slowly   Complete by: As directed      Allergies as of 05/12/2020      Reactions   Neosporin [neomycin-bacitracin Zn-polymyx] Swelling, Rash, Other (See Comments)   Blisters   Bactrim [sulfamethoxazole-trimethoprim]    Made her feel very sick   Benzalkonium Chloride Other (See Comments)   Redness (Pt is unaware)  It works by killing microorganisms and inhibiting their future growth, and for this reason frequently appears as an ingredient in antibacterial hand wipes, antiseptic creams and anti-itch ointments.   Latex Rash      Medication List    STOP taking these medications   ciprofloxacin 500 MG tablet Commonly known as: Cipro   LORazepam 0.5 MG tablet Commonly known as: ATIVAN   potassium chloride 10 MEQ tablet Commonly  known as: KLOR-CON     TAKE these medications   anastrozole 1 MG tablet Commonly known as: ARIMIDEX Take 1 tablet (1 mg total) by mouth daily.   dronabinol 2.5 MG capsule Commonly known as: MARINOL Take 1 capsule (2.5 mg total) by mouth daily before lunch.   furosemide 20 MG tablet Commonly known as: LASIX Take 1 tablet (20 mg total) by mouth every other day.   hydrocortisone cream 1 % Apply topically 2 (two) times daily.   losartan 25 MG tablet Commonly known as: COZAAR Take 0.5 tablets (12.5 mg total) by mouth daily.   metoCLOPramide 10 MG tablet Commonly known as: REGLAN Take 1 tablet (10 mg total) by mouth every 8 (eight) hours as needed for nausea.   ondansetron 4 MG disintegrating tablet Commonly known as: ZOFRAN-ODT Take 1 tablet (4 mg total) by mouth every 8 (eight) hours as needed for nausea or vomiting.   pantoprazole 40 MG tablet Commonly known as: PROTONIX Take 1 tablet (40 mg total) by mouth daily.   potassium chloride 10 MEQ tablet Commonly known as: KLOR-CON Take 40 mEq by mouth daily.   prochlorperazine 10 MG tablet Commonly known as: COMPAZINE Take 1 tablet (10 mg total) by mouth every 6 (six) hours as needed (Nausea or vomiting).   tamsulosin 0.4 MG Caps capsule Commonly known as: FLOMAX Take 1 capsule (0.4 mg total) by mouth daily.   vitamin B-12 1000 MCG tablet Commonly known as: CYANOCOBALAMIN Take 1 tablet (1,000 mcg total) by mouth daily.            Discharge Care Instructions  (From admission, onward)         Start     Ordered   05/12/20 0000  Discharge wound care:       Comments: Wound care  Every shift      Comments: Cleanse buttocks with Soap and water and pat dry. Apply Zinc ointment twice daily and PRN soilage. No disposable briefs.  Dermatherapy linen will wick moisture and improve skin microclimate.   05/12/20 1134   05/11/20 0000  Discharge wound care:       Comments: Cleanse buttocks with Soap and water and pat dry.  Apply Zinc ointment twice daily and PRN soilage. No disposable briefs.  Dermatherapy linen will wick moisture and improve skin microclimate.   05/11/20 1410          Contact information for follow-up providers  [name], MD Follow up.   Specialty: Family Medicine Contact information: [place] [address] [phone]            Contact information for after-discharge care    Destination    [facility] .   Service: Skilled Nursing Contact information: [place] [phone]                 Allergies  Allergen Reactions  . Neosporin [Neomycin-Bacitracin Zn-Polymyx] Swelling, Rash and Other (See Comments)    Blisters  . Bactrim [Sulfamethoxazole-Trimethoprim]     Made her feel very sick  . Benzalkonium Chloride Other (See Comments)    Redness (Pt is unaware)  It works by killing microorganisms and inhibiting their future growth, and for this reason frequently appears as an ingredient in antibacterial hand wipes, antiseptic creams and anti-itch ointments.  . Latex Rash    The results of significant diagnostics from this hospitalization (including imaging, microbiology, ancillary and laboratory) are listed below for reference.    Microbiology: Recent Results (from the past 240 hour(s))  SARS CORONAVIRUS 2 (TAT 6-24 HRS) Nasopharyngeal Nasopharyngeal Swab     Status: None   Collection Time: 05/02/20  7:15 PM   Specimen: Nasopharyngeal Swab  Result Value Ref Range Status   SARS Coronavirus 2 NEGATIVE NEGATIVE Final    Comment: (NOTE) SARS-CoV-2 target nucleic acids are NOT DETECTED.  The SARS-CoV-2 RNA is generally detectable in upper and lower respiratory specimens during the acute phase of infection. Negative results do not preclude SARS-CoV-2 infection, do not rule out co-infections with other pathogens, and should not be used as the sole basis  for treatment or other patient management decisions. Negative results must be combined with clinical observations, patient history, and epidemiological information. The expected result is Negative.  Fact Sheet for Patients: SugarRoll.be  Fact Sheet for Healthcare Providers: https://www.woods-mathews.com/  This test is not yet approved or cleared by the [place] FDA and  has been authorized for detection and/or diagnosis of SARS-CoV-2 by FDA under an Emergency Use Authorization (EUA). This EUA will remain  in effect (meaning this test can be used) for the duration of the COVID-19 declaration under Se ction 564(b)(1) of the Act, 21 U.S.C. section 360bbb-3(b)(1), unless the authorization is terminated or revoked sooner.  Performed at [facility], [place] [address], [place], [place] [address]   SARS CORONAVIRUS 2 (TAT 6-24 HRS) Nasopharyngeal Nasopharyngeal Swab     Status: None   Collection Time: 05/09/20  1:58 PM   Specimen: Nasopharyngeal Swab  Result Value Ref Range Status   SARS Coronavirus 2 NEGATIVE NEGATIVE Final    Comment: (NOTE) SARS-CoV-2 target nucleic acids are NOT DETECTED.  The SARS-CoV-2 RNA is generally detectable in upper and lower respiratory specimens during the acute phase of infection. Negative results do not preclude SARS-CoV-2 infection, do not rule out co-infections with other pathogens, and should not be used as the sole basis for treatment or other patient management decisions. Negative results must be combined with clinical observations, patient history, and epidemiological information. The expected result is Negative.  Fact Sheet for Patients: SugarRoll.be  Fact Sheet for Healthcare Providers: https://www.woods-mathews.com/  This test is not yet approved or cleared by the [place] FDA and  has been authorized for detection and/or diagnosis of SARS-CoV-2  by FDA under an Emergency Use Authorization (EUA). This EUA will remain  in effect (meaning this test can be used)  for the duration of the COVID-19 declaration under Se ction 564(b)(1) of the Act, 21 U.S.C. section 360bbb-3(b)(1), unless the authorization is terminated or revoked sooner.  Performed at [facility], [place] [address] [place], [place] [address]   SARS CORONAVIRUS 2 (TAT 6-24 HRS) Nasopharyngeal Nasopharyngeal Swab     Status: None   Collection Time: 05/11/20  5:45 PM   Specimen: Nasopharyngeal Swab  Result Value Ref Range Status   SARS Coronavirus 2 NEGATIVE NEGATIVE Final    Comment: (NOTE) SARS-CoV-2 target nucleic acids are NOT DETECTED.  The SARS-CoV-2 RNA is generally detectable in upper and lower respiratory specimens during the acute phase of infection. Negative results do not preclude SARS-CoV-2 infection, do not rule out co-infections with other pathogens, and should not be used as the sole basis for treatment or other patient management decisions. Negative results must be combined with clinical observations, patient history, and epidemiological information. The expected result is Negative.  Fact Sheet for Patients: SugarRoll.be  Fact Sheet for Healthcare Providers: https://www.woods-mathews.com/  This test is not yet approved or cleared by the [place] FDA and  has been authorized for detection and/or diagnosis of SARS-CoV-2 by FDA under an Emergency Use Authorization (EUA). This EUA will remain  in effect (meaning this test can be used) for the duration of the COVID-19 declaration under Se ction 564(b)(1) of the Act, 21 U.S.C. section 360bbb-3(b)(1), unless the authorization is terminated or revoked sooner.  Performed at [facility], [place] [address] [place], [place] [address]     Procedures/Studies: DG Abd 1 View  Result Date: 05/02/2020 CLINICAL DATA:  The joint with nausea and vomiting.  EXAM: ABDOMEN - 1 VIEW COMPARISON:  Radiograph 04/20/2020, but CT 03/05/2020 FINDINGS: No evidence of free air on this supine view. Slight increased air throughout not abnormally distended colon. Probable barium in the appendix. There is a 2.2 cm stone in the region of the right renal pelvis. Granulomatous calcifications of the liver and spleen. Cholecystectomy clips in the right upper quadrant. Known osseous metastatic disease on prior PET is not well visualized by radiograph. IMPRESSION: 1. Nonobstructive bowel gas pattern. Mild increased air throughout the colon without abnormal distension is nonspecific in the setting. 2. Right renal stone. Electronically Signed   By: Keith Rake M.D.   On: 05/02/2020 20:13   CT Head Wo Contrast  Result Date: 05/02/2020 CLINICAL DATA:  70 year old female with metastatic breast cancer. Evaluate for brain metastases. EXAM: CT HEAD WITHOUT CONTRAST TECHNIQUE: Contiguous axial images were obtained from the base of the skull through the vertex without intravenous contrast. COMPARISON:  03/26/2020 brain MR FINDINGS: Brain: Parasagittal mass along the mid falx (series 2: Image 22-23) does not appear significantly changed from 03/26/2020 MR. The LEFT cerebellar lesion identified on recent MR is difficult to visualize on this study. No new masses/metastatic disease is identified on this noncontrast study. There is no evidence of acute infarct, midline shift, hydrocephalus or hemorrhage. Vascular: No hyperdense vessel or unexpected calcification. Skull: The known skull lesions identified on recent MR are difficult to visualize on this study. Sinuses/Orbits: No acute abnormality Other: None IMPRESSION: 1. No acute abnormality. No new masses/metastatic disease on this noncontrast study. 2. No significant change in parafalcine mass/metastasis. 3. The LEFT cerebellar lesion and skull lesions identified on recent MR are difficult to visualize on this study. Electronically Signed   By:  Margarette Canada M.D.   On: 05/02/2020 16:01   MR BRAIN W WO CONTRAST  Result  Date: 05/06/2020 CLINICAL DATA:  Staging of intracranial meningeal carcinomatosis. EXAM: MRI HEAD WITHOUT AND WITH CONTRAST TECHNIQUE: Multiplanar, multiecho pulse sequences of the brain and surrounding structures were obtained without and with intravenous contrast. CONTRAST:  70mL GADAVIST GADOBUTROL 1 MMOL/ML IV SOLN COMPARISON:  03/26/2020 FINDINGS: Brain: No acute infarct, mass effect or extra-axial collection. No acute or chronic hemorrhage. 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 The midline structures are normal. There is unchanged dural thickening, right greater than left. The thickest portion is at the anterior right convexity, measuring 6 mm, unchanged. There is a 7 mm extra-axial mass within the left posterior fossa that is unchanged (17:7. A multilobular inter hemispheric lesion measures 3.6 x 1.0 cm, unchanged. Vascular: Major flow voids are preserved. Skull and upper cervical spine: Generally low T1-weighted signal throughout the calvarium Sinuses/Orbits:No paranasal sinus fluid levels or advanced mucosal thickening. No mastoid or middle ear effusion. Normal orbits. Other: Unchanged appearance of numerous subcutaneous lesions. IMPRESSION: 1. Unchanged right-greater-than-left dural thickening, which is greatest at the anterior right convexity. 2. Unchanged inter hemispheric extra-axial mass, likely metastatic disease. 3. Unchanged 7 mm extra-axial lesion of the left posterior fossa, which may be a meningioma or a focus of metastatic disease. Electronically Signed   By: Ulyses Jarred M.D.   On: 05/06/2020 22:44   MR CERVICAL SPINE W WO CONTRAST  Result Date: 05/04/2020 CLINICAL DATA:  Breast carcinoma. Difficulty swallowing and lower extremity weakness. EXAM: MRI TOTAL SPINE WITHOUT AND WITH CONTRAST TECHNIQUE: Multisequence MR imaging of the spine from the cervical spine to the sacrum was performed prior to and following IV  contrast administration for evaluation of spinal metastatic disease. CONTRAST:  62mL GADAVIST GADOBUTROL 1 MMOL/ML IV SOLN COMPARISON:  None. FINDINGS: MRI CERVICAL SPINE FINDINGS Alignment: Physiologic. Vertebrae: There is diffuse abnormal signal throughout the cervical vertebral column. Lesions involve the vertebral bodies and the spinous processes. There is no compression fracture. Cord: No abnormal enhancement in the spinal cord. No epidural enhancement. Posterior Fossa, vertebral arteries, paraspinal tissues: Incompletely visualized dural-based mass in the left posterior fossa, unchanged. Disc levels: Degenerative disc disease greatest at C5-6 and C6-7 without spinal canal stenosis. MRI THORACIC SPINE FINDINGS Alignment:  Physiologic. Vertebrae: Diffusely abnormal signal within the thoracic vertebral column with greatest lesion at T9. No compression fracture. Cord: There are 2 small foci of abnormal contrast enhancement along the left dorsal aspect of the distal spinal cord at the T12 level. No epidural enhancement. Paraspinal and other soft tissues: Negative Disc levels: No spinal canal stenosis MRI LUMBAR SPINE FINDINGS Segmentation:  Normal Alignment:  Normal Vertebrae: Diffusely abnormal bone marrow signal. No compression fracture. Conus medullaris: Extends to the L1 level and appears normal aside from the lesions at the T12 level described above. Paraspinal and other soft tissues: Negative Disc levels: No spinal canal stenosis. IMPRESSION: 1. Diffuse osseous metastatic disease without pathologic fracture. 2. 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 3. No spinal canal stenosis. Electronically Signed   By: Ulyses Jarred M.D.   On: 05/04/2020 22:58   MR THORACIC SPINE W WO CONTRAST  Result Date: 05/04/2020 CLINICAL DATA:  Breast carcinoma. Difficulty swallowing and lower extremity  weakness. EXAM: MRI TOTAL SPINE WITHOUT AND WITH CONTRAST TECHNIQUE: Multisequence MR imaging of the spine from the cervical spine to the sacrum was performed prior to and following IV contrast administration for evaluation of spinal metastatic disease. CONTRAST:  34mL GADAVIST GADOBUTROL 1 MMOL/ML IV SOLN COMPARISON:  None. FINDINGS: MRI CERVICAL SPINE FINDINGS Alignment: Physiologic. Vertebrae: There is diffuse abnormal signal throughout the cervical vertebral column. Lesions involve the vertebral bodies and the spinous processes. There is no compression fracture. Cord: No abnormal enhancement in the spinal cord. No epidural enhancement. Posterior Fossa, vertebral arteries, paraspinal tissues: Incompletely visualized dural-based mass in the left posterior fossa, unchanged. Disc levels: Degenerative disc disease greatest at C5-6 and C6-7 without spinal canal stenosis. MRI THORACIC SPINE FINDINGS Alignment:  Physiologic. Vertebrae: Diffusely abnormal signal within the thoracic vertebral column with greatest lesion at T9. No compression fracture. Cord: There are 2 small foci of abnormal contrast enhancement along the left dorsal aspect of the distal spinal cord at the T12 level. No epidural enhancement. Paraspinal and other soft tissues: Negative Disc levels: No spinal canal stenosis MRI LUMBAR SPINE FINDINGS Segmentation:  Normal Alignment:  Normal Vertebrae: Diffusely abnormal bone marrow signal. No compression fracture. Conus medullaris: Extends to the L1 level and appears normal aside from the lesions at the T12 level described above. Paraspinal and other soft tissues: Negative Disc levels: No spinal canal stenosis. IMPRESSION: 1. Diffuse osseous metastatic disease without pathologic fracture. 2. 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 3. No spinal canal stenosis. Electronically Signed   By:  Ulyses Jarred M.D.   On: 05/04/2020 22:58   MR Lumbar Spine W Wo Contrast  Result Date: 05/04/2020 CLINICAL DATA:  Breast carcinoma. Difficulty swallowing and lower extremity weakness. EXAM: MRI TOTAL SPINE WITHOUT AND WITH CONTRAST TECHNIQUE: Multisequence MR imaging of the spine from the cervical spine to the sacrum was performed prior to and following IV contrast administration for evaluation of spinal metastatic disease. CONTRAST:  19mL GADAVIST GADOBUTROL 1 MMOL/ML IV SOLN COMPARISON:  None. FINDINGS: MRI CERVICAL SPINE FINDINGS Alignment: Physiologic. Vertebrae: There is diffuse abnormal signal throughout the cervical vertebral column. Lesions involve the vertebral bodies and the spinous processes. There is no compression fracture. Cord: No abnormal enhancement in the spinal cord. No epidural enhancement. Posterior Fossa, vertebral arteries, paraspinal tissues: Incompletely visualized dural-based mass in the left posterior fossa, unchanged. Disc levels: Degenerative disc disease greatest at C5-6 and C6-7 without spinal canal stenosis. MRI THORACIC SPINE FINDINGS Alignment:  Physiologic. Vertebrae: Diffusely abnormal signal within the thoracic vertebral column with greatest lesion at T9. No compression fracture. Cord: There are 2 small foci of abnormal contrast enhancement along the left dorsal aspect of the distal spinal cord at the T12 level. No epidural enhancement. Paraspinal and other soft tissues: Negative Disc levels: No spinal canal stenosis MRI LUMBAR SPINE FINDINGS Segmentation:  Normal Alignment:  Normal Vertebrae: Diffusely abnormal bone marrow signal. No compression fracture. Conus medullaris: Extends to the L1 level and appears normal aside from the lesions at the T12 level described above. Paraspinal and other soft tissues: Negative Disc levels: No spinal canal stenosis. IMPRESSION: 1. Diffuse osseous metastatic disease without pathologic fracture. 2. 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 3. No spinal canal stenosis. Electronically Signed   By: Ulyses Jarred M.D.   On: 05/04/2020 22:58   DG Chest Port 1 View  Result Date: 05/02/2020 CLINICAL DATA:  Weakness.  Nausea. EXAM: PORTABLE CHEST 1 VIEW COMPARISON:  Chest CT 02/27/2020 FINDINGS: Tip of the right  upper extremity PICC in the SVC. Heart is borderline enlarged. Mild hilar prominence which may be due to adenopathy. There also calcified mediastinal and left hilar nodes. Many of the pulmonary nodules on prior CT are not seen. There is a calcified granuloma at the left lung base. No confluent consolidation. No pleural fluid. No pneumothorax. Postsurgical change of the left axilla and chest wall. Known osseous metastatic disease is not well detected by radiograph. IMPRESSION: 1. No acute findings. 2. Bilateral hilar prominence may be due to adenopathy in this patient with known metastatic breast cancer. 3. Right upper extremity PICC tip in the SVC. Electronically Signed   By: Keith Rake M.D.   On: 05/02/2020 15:08   DG Abdomen Acute W/Chest  Result Date: 04/20/2020 CLINICAL DATA:  Vomiting cancer EXAM: DG ABDOMEN ACUTE WITH 1 VIEW CHEST COMPARISON:  CT chest 02/27/2020, PET CT 03/05/2020 FINDINGS: Single view chest demonstrates right upper extremity central venous catheter tip at the cavoatrial junction. Postsurgical changes of left chest and axilla. No focal opacity or pleural effusion. Normal cardiomediastinal silhouette. Supine and upright views of the abdomen demonstrate no free air beneath the diaphragm. Surgical clips in the right upper quadrant. Multiple round calcifications in the left upper quadrant consistent with splenic granuloma. Calcified left lung base granuloma. Nonobstructed gas pattern. Staghorn calculus right kidney. Known skeletal metastatic disease is better seen on PET CT. IMPRESSION:  Nonobstructed gas pattern. Staghorn calculus right kidney. No radiographic evidence for acute cardiopulmonary abnormality. Electronically Signed   By: Donavan Foil M.D.   On: 04/20/2020 16:21   ECHOCARDIOGRAM COMPLETE  Result Date: 05/07/2020    ECHOCARDIOGRAM REPORT   Patient Name:   CARSEN MACHI Date of Exam: 05/07/2020 Medical Rec #:  [id number]      Height:       69.0 in Accession #:    [id number]     Weight:       182.0 lb Date of Birth:  [birth year]     BSA:          1.985 m Patient Age:    70 years       BP:           107/65 mmHg Patient Gender: F              HR:           103 bpm. Exam Location:  Inpatient Procedure: 2D Echo, Cardiac Doppler and Color Doppler Indications:    CHF  History:        Patient has no prior history of Echocardiogram examinations.                 Risk Factors:Hypertension and Dyslipidemia. Breast cancer,                 chemo, radiation.  Sonographer:    Dustin Flock Referring Phys: [id number] [place] IMPRESSIONS  1. Left ventricular ejection fraction, by estimation, is 60 to 65%. The left ventricle has normal function. The left ventricle has no regional wall motion abnormalities. Left ventricular diastolic parameters were normal.  2. Right ventricular systolic function is normal. The right ventricular size is normal. There is mildly elevated pulmonary artery systolic pressure.  3. The mitral valve is normal in structure. Trivial mitral valve regurgitation. No evidence of mitral stenosis.  4. The aortic valve is tricuspid. Aortic valve regurgitation is not visualized. No aortic stenosis is present.  5. The inferior vena cava is normal in size with greater than 50%  respiratory variability, suggesting right atrial pressure of 3 mmHg. Comparison(s): No prior Echocardiogram. Conclusion(s)/Recommendation(s): Normal biventricular function without evidence of hemodynamically significant valvular heart disease. FINDINGS  Left Ventricle: Left ventricular ejection fraction, by  estimation, is 60 to 65%. The left ventricle has normal function. The left ventricle has no regional wall motion abnormalities. The left ventricular internal cavity size was normal in size. There is  no left ventricular hypertrophy. Left ventricular diastolic parameters were normal. Right Ventricle: The right ventricular size is normal. No increase in right ventricular wall thickness. Right ventricular systolic function is normal. There is mildly elevated pulmonary artery systolic pressure. The tricuspid regurgitant velocity is 2.97  m/s, and with an assumed right atrial pressure of 3 mmHg, the estimated right ventricular systolic pressure is 02.5 mmHg. Left Atrium: Left atrial size was normal in size. Right Atrium: Right atrial size was normal in size. Pericardium: There is no evidence of pericardial effusion. Mitral Valve: The mitral valve is normal in structure. Trivial mitral valve regurgitation. No evidence of mitral valve stenosis. Tricuspid Valve: The tricuspid valve is normal in structure. Tricuspid valve regurgitation is trivial. No evidence of tricuspid stenosis. Aortic Valve: The aortic valve is tricuspid. Aortic valve regurgitation is not visualized. No aortic stenosis is present. Aortic valve peak gradient measures 10.8 mmHg. Pulmonic Valve: The pulmonic valve was not well visualized. Pulmonic valve regurgitation is not visualized. No evidence of pulmonic stenosis. Aorta: The aortic root, ascending aorta, aortic arch and descending aorta are all structurally normal, with no evidence of dilitation or obstruction. Venous: The inferior vena cava is normal in size with greater than 50% respiratory variability, suggesting right atrial pressure of 3 mmHg. IAS/Shunts: The atrial septum is grossly normal.  LEFT VENTRICLE PLAX 2D LVIDd:         4.80 cm  Diastology LVIDs:         2.60 cm  LV e' medial:    7.83 cm/s LV PW:         1.00 cm  LV E/e' medial:  10.0 LV IVS:        1.10 cm  LV e' lateral:   14.30 cm/s  LVOT diam:     2.00 cm  LV E/e' lateral: 5.5 LV SV:         79 LV SV Index:   40 LVOT Area:     3.14 cm  RIGHT VENTRICLE RV Basal diam:  3.20 cm RV S prime:     12.30 cm/s TAPSE (M-mode): 1.9 cm LEFT ATRIUM             Index       RIGHT ATRIUM           Index LA diam:        2.70 cm 1.36 cm/m  RA Area:     12.60 cm LA Vol (A2C):   30.2 ml 15.22 ml/m RA Volume:   28.10 ml  14.16 ml/m LA Vol (A4C):   37.1 ml 18.69 ml/m LA Biplane Vol: 33.8 ml 17.03 ml/m  AORTIC VALVE AV Area (Vmax): 2.45 cm AV Vmax:        164.00 cm/s AV Peak Grad:   10.8 mmHg LVOT Vmax:      128.00 cm/s LVOT Vmean:     77.000 cm/s LVOT VTI:       0.253 m  AORTA Ao Root diam: 3.00 cm MITRAL VALVE               TRICUSPID VALVE MV Area (  PHT): 5.38 cm    TR Peak grad:   35.3 mmHg MV Decel Time: 141 msec    TR Vmax:        297.00 cm/s MV E velocity: 78.20 cm/s MV A velocity: 72.20 cm/s  SHUNTS MV E/A ratio:  1.08        Systemic VTI:  0.25 m                            Systemic Diam: 2.00 cm Buford Dresser MD Electronically signed by Buford Dresser MD Signature Date/Time: 05/07/2020/3:47:58 PM    Final    DG ESOPHAGUS W SINGLE CM (SOL OR THIN BA)  Result Date: 04/23/2020 CLINICAL DATA:  Dysphagia. EXAM: ESOPHOGRAM/BARIUM SWALLOW TECHNIQUE: Single contrast examination was performed using  thin barium. FLUOROSCOPY TIME:  Radiation Exposure Index (if provided by the fluoroscopic device): 17.9 mGy. COMPARISON:  None. FINDINGS: Exam is limited as the patient only drank a small amount of contrast. No definite mass or stricture is noted in the esophagus small sliding-type hiatal hernia is noted. No definite reflux is noted. Barium tablet could not be administered as the patient could not be placed in upright position. IMPRESSION: Small sliding-type hiatal hernia. No definite mass or stricture is noted in the esophagus. Electronically Signed   By: Marijo Conception M.D.   On: 04/23/2020 09:15    Labs: BNP (last 3 results) Recent Labs     05/07/20 0529  BNP 58.0   Basic Metabolic Panel: Recent Labs  Lab 05/07/20 0539 05/08/20 0539 05/09/20 0538 05/09/20 1453 05/10/20 0551 05/11/20 1044  NA 138 138 141  --  140 141  K 3.9 3.4* 2.9* 2.8* 3.9 3.3*  CL 105 102 101  --  102 104  CO2 20* 22 29  --  28 26  GLUCOSE 105* 112* 97  --  91 109*  BUN 11 13 14   --  16 18  CREATININE 0.67 0.61 0.72  --  0.66 0.72  CALCIUM 8.7* 8.3* 8.2*  --  8.4* 8.2*  MG  --   --  1.8  --   --   --    Liver Function Tests: Recent Labs  Lab 05/08/20 0539 05/09/20 0538 05/10/20 0551 05/11/20 1044  AST 107* 103* 118* 105*  ALT 45* 44 44 41  ALKPHOS 188* 177* 209* 227*  BILITOT 2.2* 2.1* 2.9* 2.5*  PROT 4.5* 4.1* 4.3* 4.2*  ALBUMIN 2.5* 2.3* 2.4* 2.4*   No results for input(s): LIPASE, AMYLASE in the last 168 hours. No results for input(s): AMMONIA in the last 168 hours. CBC: Recent Labs  Lab 05/08/20 0539 05/09/20 0538 05/10/20 0551 05/11/20 1044 05/12/20 0807  WBC 1.9* 1.7* 1.9* 2.1* 2.6*  HGB 8.1* 7.2* 7.4* 7.2* 8.2*  HCT 24.7* 21.9* 23.1* 22.5* 25.4*  MCV 89.8 89.4 91.3 91.8 91.7  PLT 116* 99* 89* 54* 47*   Cardiac Enzymes: No results for input(s): CKTOTAL, CKMB, CKMBINDEX, TROPONINI in the last 168 hours. BNP: Invalid input(s): POCBNP CBG: No results for input(s): GLUCAP in the last 168 hours. D-Dimer No results for input(s): DDIMER in the last 72 hours. Hgb A1c No results for input(s): HGBA1C in the last 72 hours. Lipid Profile No results for input(s): CHOL, HDL, LDLCALC, TRIG, CHOLHDL, LDLDIRECT in the last 72 hours. Thyroid function studies No results for input(s): TSH, T4TOTAL, T3FREE, THYROIDAB in the last 72 hours.  Invalid input(s): FREET3 Anemia work up [employer]  05/10/20 0551  VITAMINB12 52*   Urinalysis    Component Value Date/Time   COLORURINE YELLOW 05/03/2020 0415   APPEARANCEUR HAZY (A) 05/03/2020 0415   LABSPEC 1.011 05/03/2020 0415   PHURINE 6.0 05/03/2020 0415   GLUCOSEU  NEGATIVE 05/03/2020 0415   GLUCOSEU NEGATIVE 11/05/2012 1559   HGBUR LARGE (A) 05/03/2020 0415   BILIRUBINUR NEGATIVE 05/03/2020 0415   BILIRUBINUR neg 08/10/2018 1529   KETONESUR 20 (A) 05/03/2020 0415   PROTEINUR 30 (A) 05/03/2020 0415   UROBILINOGEN 0.2 08/10/2018 1529   UROBILINOGEN 0.2 11/05/2012 1559   NITRITE NEGATIVE 05/03/2020 0415   LEUKOCYTESUR LARGE (A) 05/03/2020 0415   Sepsis Labs Invalid input(s): PROCALCITONIN,  WBC,  LACTICIDVEN Microbiology Recent Results (from the past 240 hour(s))  SARS CORONAVIRUS 2 (TAT 6-24 HRS) Nasopharyngeal Nasopharyngeal Swab     Status: None   Collection Time: 05/02/20  7:15 PM   Specimen: Nasopharyngeal Swab  Result Value Ref Range Status   SARS Coronavirus 2 NEGATIVE NEGATIVE Final    Comment: (NOTE) SARS-CoV-2 target nucleic acids are NOT DETECTED.  The SARS-CoV-2 RNA is generally detectable in upper and lower respiratory specimens during the acute phase of infection. Negative results do not preclude SARS-CoV-2 infection, do not rule out co-infections with other pathogens, and should not be used as the sole basis for treatment or other patient management decisions. Negative results must be combined with clinical observations, patient history, and epidemiological information. The expected result is Negative.  Fact Sheet for Patients: SugarRoll.be  Fact Sheet for Healthcare Providers: https://www.woods-mathews.com/  This test is not yet approved or cleared by the [place] FDA and  has been authorized for detection and/or diagnosis of SARS-CoV-2 by FDA under an Emergency Use Authorization (EUA). This EUA will remain  in effect (meaning this test can be used) for the duration of the COVID-19 declaration under Se ction 564(b)(1) of the Act, 21 U.S.C. section 360bbb-3(b)(1), unless the authorization is terminated or revoked sooner.  Performed at [facility], [place] [address] [place], [place] [address]   SARS CORONAVIRUS 2 (TAT 6-24 HRS) Nasopharyngeal Nasopharyngeal Swab     Status: None   Collection Time: 05/09/20  1:58 PM   Specimen: Nasopharyngeal Swab  Result Value Ref Range Status   SARS Coronavirus 2 NEGATIVE NEGATIVE Final    Comment: (NOTE) SARS-CoV-2 target nucleic acids are NOT DETECTED.  The SARS-CoV-2 RNA is generally detectable in upper and lower respiratory specimens during the acute phase of infection. Negative results do not preclude SARS-CoV-2 infection, do not rule out co-infections with other pathogens, and should not be used as the sole basis for treatment or other patient management decisions. Negative results must be combined with clinical observations, patient history, and epidemiological information. The expected result is Negative.  Fact Sheet for Patients: SugarRoll.be  Fact Sheet for Healthcare Providers: https://www.woods-mathews.com/  This test is not yet approved or cleared by the [place] FDA and  has been authorized for detection and/or diagnosis of SARS-CoV-2 by FDA under an Emergency Use Authorization (EUA). This EUA will remain  in effect (meaning this test can be used) for the duration of the COVID-19 declaration under Se ction 564(b)(1) of the Act, 21 U.S.C. section 360bbb-3(b)(1), unless the authorization is terminated or revoked sooner.  Performed at [facility], [place] [address] [place], [place] [address]   SARS CORONAVIRUS 2 (TAT 6-24 HRS) Nasopharyngeal Nasopharyngeal Swab     Status: None   Collection Time: 05/11/20  5:45 PM  Specimen: Nasopharyngeal Swab  Result Value Ref Range Status   SARS Coronavirus 2 NEGATIVE NEGATIVE Final    Comment: (NOTE) SARS-CoV-2 target nucleic acids are NOT DETECTED.  The SARS-CoV-2 RNA is generally detectable in upper and lower respiratory specimens during the acute phase of infection. Negative results do not  preclude SARS-CoV-2 infection, do not rule out co-infections with other pathogens, and should not be used as the sole basis for treatment or other patient management decisions. Negative results must be combined with clinical observations, patient history, and epidemiological information. The expected result is Negative.  Fact Sheet for Patients: SugarRoll.be  Fact Sheet for Healthcare Providers: https://www.woods-mathews.com/  This test is not yet approved or cleared by the [place] FDA and  has been authorized for detection and/or diagnosis of SARS-CoV-2 by FDA under an Emergency Use Authorization (EUA). This EUA will remain  in effect (meaning this test can be used) for the duration of the COVID-19 declaration under Se ction 564(b)(1) of the Act, 21 U.S.C. section 360bbb-3(b)(1), unless the authorization is terminated or revoked sooner.  Performed at [facility], [place] [address] [place], [place] [address]    Time coordinating discharge: 35 minutes  SIGNED: Antonieta Pert, MD  [facility] 05/12/2020, 4:45 PM  If 7PM-7AM, please contact night-coverage [email or web address]

## 2020-05-12 NOTE — Plan of Care (Signed)
  Problem: Education: Goal: Knowledge of the prescribed therapeutic regimen will improve Outcome: Adequate for Discharge   Problem: Activity: Goal: Ability to implement measures to reduce episodes of fatigue will improve Outcome: Adequate for Discharge   Problem: Bowel/Gastric: Goal: Will not experience complications related to bowel motility Outcome: Adequate for Discharge   Problem: Coping: Goal: Ability to identify and develop effective coping behavior will improve Outcome: Adequate for Discharge   Problem: Nutritional: Goal: Maintenance of adequate nutrition will improve Description: Maintenance of adequate nutrition will improve by 05/07/19 Outcome: Adequate for Discharge   Problem: Education: Goal: Knowledge of General Education information will improve Description: Including pain rating scale, medication(s)/side effects and non-pharmacologic comfort measures Outcome: Adequate for Discharge   Problem: Health Behavior/Discharge Planning: Goal: Ability to manage health-related needs will improve Outcome: Adequate for Discharge   Problem: Clinical Measurements: Goal: Ability to maintain clinical measurements within normal limits will improve Outcome: Adequate for Discharge Goal: Will remain free from infection Outcome: Adequate for Discharge Goal: Diagnostic test results will improve Outcome: Adequate for Discharge Goal: Respiratory complications will improve Outcome: Adequate for Discharge Goal: Cardiovascular complication will be avoided Outcome: Adequate for Discharge   Problem: Nutrition: Goal: Adequate nutrition will be maintained Outcome: Adequate for Discharge   Problem: Activity: Goal: Risk for activity intolerance will decrease Outcome: Adequate for Discharge   Problem: Coping: Goal: Level of anxiety will decrease Outcome: Adequate for Discharge   Problem: Elimination: Goal: Will not experience complications related to bowel motility Outcome: Adequate  for Discharge Goal: Will not experience complications related to urinary retention Outcome: Adequate for Discharge   Problem: Pain Managment: Goal: General experience of comfort will improve Outcome: Adequate for Discharge   Problem: Safety: Goal: Ability to remain free from injury will improve Outcome: Adequate for Discharge   Problem: Skin Integrity: Goal: Risk for impaired skin integrity will decrease Outcome: Adequate for Discharge

## 2020-05-12 NOTE — Progress Notes (Signed)
Chaplain engaged in follow-up visit to complete Brooke [name]'s Advanced Directive.  Brooke [name] voiced not feeling well this morning and needed some time.  Before chaplain could leave the unit, Brooke [name]'s friend and landlord Brooke [name] called, stating that they needed to get the Advanced Directive done today because Brooke [name] is going to be discharged.  Chaplain engaged in conversation with Brooke [name] and Brooke [name] who shared the dynamics they are currently dealing with.  There are some [name] dynamics happening around who should be the POA as they think about Brooke [name] and possessions. Chaplain worked to Civil engineer, contracting but could not get notary in place.    Chaplain provided them with information on ways to complete document outside of the hospital.  Brooke [name] is working to Brooke [name] wants and needs at this time and put the best person in place to help her.      05/12/20 1200  Clinical Encounter Type  Visited With Patient;Patient and [name] together  Visit Type Follow-up;Social support  Stress Factors  [name] Stress Factors Major life changes;[name] relationships

## 2020-05-12 NOTE — Progress Notes (Signed)
Brooke [name] was discharged to [name] today.  She is scheduled to see me again on 05/15/2020 and we plan to start Ibrance at that time.

## 2020-05-12 NOTE — TOC Transition Note (Signed)
Transition of Care [facility]) - CM/SW Discharge Note   Patient Details  Name: Brooke [name] MRN: [id number] Date of Birth: 08/25/1950  Transition of Care [facility]) CM/SW Contact:  Lynnell Catalan, RN Phone Number: 05/12/2020, 1:13 PM   Clinical Narrative:     Pt to dc to [facility] room 3238 today. [facility] contacted for transport. Yellow DNR on chart for transport. RN to call report to [phone].   Final next level of care: [place] Barriers to Discharge: No Barriers Identified  Discharge Placement              Patient chooses bed at: [facility] Patient to be transferred to facility by: [place] Name of family member notified: Sisters Collie Siad and [name] Patient and family notified of of transfer: 05/12/20  Readmission Risk Interventions Readmission Risk Prevention Plan 05/04/2020  Transportation Screening Complete  PCP or Specialist Appt within 3-5 Days Complete  HRI or Home Care Consult Complete  Social Work Consult for [place] Planning/Counseling Complete  Palliative Care Screening Complete  Medication Review [employer]) Complete  Some recent data might be hidden

## 2020-05-12 NOTE — Progress Notes (Signed)
Brooke [name]   DOB:1950-04-05   JK#:[id number]   YKD#:[id number]  Subjective:  Brooke [name] was stable this morning, mentally quite clear, her usual polite slightly circumstantial self.  She continues to focus on swallowing which is the most immediate issue for her.  She is aware that she will be going to a rehab facility today but does not yet know which facility that is.  I have left information for her family yesterday saying that I would be here at 72 AM which I was but the family later called the floor nurse and said it would be difficult for them to get here until later.  I will see if we can somehow squeeze a meeting today since the family will only be here a couple of days and Brooke [name]'s situation is quite complex   Objective: white woman examined in bed Vitals:   05/11/20 1955 05/12/20 0437  BP: (!) 118/52 124/60  Pulse: 92 92  Resp: 20 16  Temp: 97.7 F (36.5 C) 97.7 F (36.5 C)  SpO2: 96% 92%    Body mass index is 26.88 kg/m.  Intake/Output Summary (Last 24 hours) at 05/12/2020 0840 Last data filed at 05/11/2020 2058 Gross per 24 hour  Intake 392 ml  Output 500 ml  Net -108 ml     CBG (last 3)  No results for input(s): GLUCAP in the last 72 hours.   Labs:  Lab Results  Component Value Date   WBC 2.6 (L) 05/12/2020   HGB 8.2 (L) 05/12/2020   HCT 25.4 (L) 05/12/2020   MCV 91.7 05/12/2020   PLT 47 (L) 05/12/2020   NEUTROABS 4.6 04/30/2020    '@LASTCHEMISTRY'$ @  Urine Studies No results for input(s): UHGB, CRYS in the last 72 hours.  Invalid input(s): UACOL, UAPR, USPG, UPH, UTP, UGL, [place], UBIL, UNIT, UROB, [place], UEPI, [place] Setting Brooke [name], [place]  Basic Metabolic Panel: Recent Labs  Lab 05/07/20 0539 05/08/20 0539 05/09/20 0538 05/09/20 1453 05/10/20 0551 05/11/20 1044  NA 138 138 141  --  140 141  K 3.9 3.4* 2.9*   < > 3.9 3.3*  CL 105 102 101  --  102 104  CO2 20* 22 29  --  28 26  GLUCOSE 105* 112* 97  --  91 109*  BUN $Re'11 13 14  'vpa$ --  16 18  CREATININE  0.67 0.61 0.72  --  0.66 0.72  CALCIUM 8.7* 8.3* 8.2*  --  8.4* 8.2*  MG  --   --  1.8  --   --   --    < > = values in this interval not displayed.   GFR Estimated Creatinine Clearance: 76.3 mL/min (by C-G formula based on SCr of 0.72 mg/dL). Liver Function Tests: Recent Labs  Lab 05/08/20 0539 05/09/20 0538 05/10/20 0551 05/11/20 1044  AST 107* 103* 118* 105*  ALT 45* 44 44 41  ALKPHOS 188* 177* 209* 227*  BILITOT 2.2* 2.1* 2.9* 2.5*  PROT 4.5* 4.1* 4.3* 4.2*  ALBUMIN 2.5* 2.3* 2.4* 2.4*   No results for input(s): LIPASE, AMYLASE in the last 168 hours. No results for input(s): AMMONIA in the last 168 hours. Coagulation profile No results for input(s): INR, PROTIME in the last 168 hours.  CBC: Recent Labs  Lab 05/08/20 0539 05/09/20 0538 05/10/20 0551 05/11/20 1044 05/12/20 0807  WBC 1.9* 1.7* 1.9* 2.1* 2.6*  HGB 8.1* 7.2* 7.4* 7.2* 8.2*  HCT 24.7* 21.9* 23.1* 22.5* 25.4*  MCV 89.8 89.4 91.3 91.8 91.7  PLT 116* 99* 89* 54* 47*   Cardiac Enzymes: No results for input(s): CKTOTAL, CKMB, CKMBINDEX, TROPONINI in the last 168 hours. BNP: Invalid input(s): POCBNP CBG: No results for input(s): GLUCAP in the last 168 hours. D-Dimer No results for input(s): DDIMER in the last 72 hours. Hgb A1c No results for input(s): HGBA1C in the last 72 hours. Lipid Profile No results for input(s): CHOL, HDL, LDLCALC, TRIG, CHOLHDL, LDLDIRECT in the last 72 hours. Thyroid function studies No results for input(s): TSH, T4TOTAL, T3FREE, THYROIDAB in the last 72 hours.  Invalid input(s): FREET3 Anemia work up Recent Labs    05/10/20 [place] 52*   Microbiology Recent Results (from the past 240 hour(s))  SARS CORONAVIRUS 2 (TAT 6-24 HRS) Nasopharyngeal Nasopharyngeal Swab     Status: None   Collection Time: 05/02/20  7:15 PM   Specimen: Nasopharyngeal Swab  Result Value Ref Range Status   SARS Coronavirus 2 NEGATIVE NEGATIVE Final    Comment: (NOTE) SARS-CoV-2  target nucleic acids are NOT DETECTED.  The SARS-CoV-2 RNA is generally detectable in upper and lower respiratory specimens during the acute phase of infection. Negative results do not preclude SARS-CoV-2 infection, do not rule out co-infections with other pathogens, and should not be used as the sole basis for treatment or other patient management decisions. Negative results must be combined with clinical observations, patient history, and epidemiological information. The expected result is Negative.  Fact Sheet for Patients: SugarRoll.be  Fact Sheet for Healthcare Providers: https://www.woods-mathews.com/  This test is not yet approved or cleared by the [place] FDA and  has been authorized for detection and/or diagnosis of SARS-CoV-2 by FDA under an Emergency Use Authorization (EUA). This EUA will remain  in effect (meaning this test can be used) for the duration of the COVID-19 declaration under Se ction 564(b)(1) of the Act, 21 U.S.C. section 360bbb-3(b)(1), unless the authorization is terminated or revoked sooner.  Performed at [facility], [place] [address] [place], [place] [address]   SARS CORONAVIRUS 2 (TAT 6-24 HRS) Nasopharyngeal Nasopharyngeal Swab     Status: None   Collection Time: 05/09/20  1:58 PM   Specimen: Nasopharyngeal Swab  Result Value Ref Range Status   SARS Coronavirus 2 NEGATIVE NEGATIVE Final    Comment: (NOTE) SARS-CoV-2 target nucleic acids are NOT DETECTED.  The SARS-CoV-2 RNA is generally detectable in upper and lower respiratory specimens during the acute phase of infection. Negative results do not preclude SARS-CoV-2 infection, do not rule out co-infections with other pathogens, and should not be used as the sole basis for treatment or other patient management decisions. Negative results must be combined with clinical observations, patient history, and epidemiological information. The  expected result is Negative.  Fact Sheet for Patients: SugarRoll.be  Fact Sheet for Healthcare Providers: https://www.woods-mathews.com/  This test is not yet approved or cleared by the [place] FDA and  has been authorized for detection and/or diagnosis of SARS-CoV-2 by FDA under an Emergency Use Authorization (EUA). This EUA will remain  in effect (meaning this test can be used) for the duration of the COVID-19 declaration under Se ction 564(b)(1) of the Act, 21 U.S.C. section 360bbb-3(b)(1), unless the authorization is terminated or revoked sooner.  Performed at [facility], [place] [name], [place], [place] [address]   SARS CORONAVIRUS 2 (TAT 6-24 HRS) Nasopharyngeal Nasopharyngeal Swab     Status: None   Collection Time: 05/11/20  5:45 PM   Specimen: Nasopharyngeal Swab  Result  Value Ref Range Status   SARS Coronavirus 2 NEGATIVE NEGATIVE Final    Comment: (NOTE) SARS-CoV-2 target nucleic acids are NOT DETECTED.  The SARS-CoV-2 RNA is generally detectable in upper and lower respiratory specimens during the acute phase of infection. Negative results do not preclude SARS-CoV-2 infection, do not rule out co-infections with other pathogens, and should not be used as the sole basis for treatment or other patient management decisions. Negative results must be combined with clinical observations, patient history, and epidemiological information. The expected result is Negative.  Fact Sheet for Patients: SugarRoll.be  Fact Sheet for Healthcare Providers: https://www.woods-mathews.com/  This test is not yet approved or cleared by the [place] FDA and  has been authorized for detection and/or diagnosis of SARS-CoV-2 by FDA under an Emergency Use Authorization (EUA). This EUA will remain  in effect (meaning this test can be used) for the duration of the COVID-19 declaration under  Se ction 564(b)(1) of the Act, 21 U.S.C. section 360bbb-3(b)(1), unless the authorization is terminated or revoked sooner.  Performed at [facility], [place] [address] [place], [place] [address]       Studies:  No results found.  Assessment: 70 y.o. 70 y.o.Brooke [name] woman status post bilateral biopsies 01/26/2017, showing  (1) in the right breast, a complex sclerosing lesion, status post lumpectomy 09/25/2017, with no malignancy identified.  (2) in the left breast, a cT2 pN1 invasive ductal carcinoma (with some lobular features but E-cadherin positive), grade 2, estrogen and progesterone receptor positive, HER-2 not amplified, with an MIB-1 of 15-20% (a) breast MRI 02/04/2017 suggests a T3 N1 tumor  (3) started neoadjuvant cyclophosphamide/docetaxel01/05/2017, discontinuedafter 1 cycle with very poor tolerance (a) started cyclophosphamide/methotrexate/fluorouracil(CMF) 03/28/2017, repeated x7 cycles, last dose 08/01/2017  (4) status postleft modified radical mastectomy7/29/19 for a ypT5 ypN2, stage IIIAinvasive ductal carcinoma, grade 2, again estrogen and progesterone receptor positive, HER-2 not amplified  (5) adjuvant radiation completed 12/21/2017 (a) capecitabinesensitization tolerated only the initial week of radiation (b) Site/dose:The patient initially received a dose of 50.4 Gy in 28 fractions to the leftchest wall andleftsupraclavicular region. This was delivered using a 3-D conformal, 4 field technique. The patient then received a boost to the mastectomy scar. This delivered an additional 10 Gy in 5 fractions using an en face electron field. The total dose was 60.4 Gy.  ( 6)tamoxifen started 03/09/2018, discontinued January 2022 with metastatic progression  (7) genetics testing 12/12/2017 through the Multi-Gene Panel offered by [facility] found no deleterious mutationsin AIP, ALK, APC, ATM, AXIN2,BAP1, BARD1, BLM,  BMPR1A, BRCA1, BRCA2, BRIP1, CASR, CDC73, CDH1, CDK4, CDKN1B, CDKN1C, CDKN2A (p14ARF), CDKN2A (p16INK4a), CEBPA, CHEK2, CTNNA1, DICER1, DIS3L2, EGFR (c.2369C>T, p.Thr790Met variant only), EPCAM (Deletion/duplication testing only), FH, FLCN, GATA2, GPC3, GREM1 (Promoter region deletion/duplication testing only), HOXB13 (c.251G>A, p.Gly84Glu), HRAS, KIT, MAX, MEN1, MET, MITF (c.952G>A, p.Glu318Lys variant only), MLH1, MSH2, MSH3, MSH6, MUTYH, NBN, NF1, NF2, NTHL1, PALB2, PDGFRA, PHOX2B, PMS2, POLD1, POLE, POT1, PRKAR1A, PTCH1, PTEN, RAD50, RAD51C, RAD51D, RB1, RECQL4, RET, RUNX1, SDHAF2, SDHA (sequence changes only), SDHB, SDHC, SDHD, SMAD4, SMARCA4, SMARCB1, SMARCE1, STK11, SUFU, TERC, TERT, TMEM127, TP53, TSC1, TSC2, VHL, WRN and WT1.   (8) Foundation 1 testing shows stable microsatellite status and 1 mutation/ Mb, TP16mutations x2 (a) PD-L1 dated July 2019 negative  METASTATIC DISEASE: January 2022 (9) CT angio of the chest 02/27/2020 shows multiple hilar nodes consistent with prior granulomatous infection, multiple small pulmonary nodules, multiple punctate granulomas throughout the liver and spleen, nodular hepatic contour and areas of mottling possibly due  to steatosis, 1.6 cm left adrenal nodule (a) PET scan 03/05/2020 shows mildly to highly hypermetabolic adenopathy, the pulmonary nodules being mildly to not hypermetabolic, a large right hepatic liver lesion with an SUV of 13.2 and additional hypermetabolic right and left lobe foci, the adrenal lesion being hypermetabolic with an SUV of 16. There are also peritoneal nodules and innumerable foci of bony involvement. Incidental note was made of small heterogeneous foci of increased uptake in the cerebellum. The CT images did not show gross abnormality in the visualized brain (b) on 03/09/2020 CA 27-29 was 983.0, CEA 744.17, the CA 19-9 normal at 32 (c) liver biopsy 03/13/2020 confirms metastati  carcinoma, estrogen receptor positive, progesterone and HER 2 negative, with an Mib-1 of 40% (d) brain MRI 03/26/2020 shows dural involvement including a 1.0 cm dural-based cerebellar lesion  (10) paclitaxel started 03/30/2020, discontinued after three doses (last dose03/04/2020) with no clinical improvement and significant drop in counts  (a) CA27-29 increased despite taxol treatment  (11) zoledronate started 03/19/2020, to be repeated every 12 weeks  (12) anastrozole started 05/07/2020  (a) palbociclib to be added once counts recover  Plan:  Brooke [name] is certainly improved as compared to when she came to the hospital but she is still a long way from being able to return to her home and do her own activities of daily living.  I am hopeful with some rehab at [name], where she is scheduled to go today, she may recover some of her function, but it seems equally or perhaps more likely that she will not and the question then is where will she go once her benefits at the facility run out.  I broached this with her today and I will broach it with her family if I am able to meet with them.  I left Brooke [name] a summary of her situation (the "assessment" above) which she may share with the family.  This is of course in technical jargon and I let the nurse know that the patient's family may want some translation.  I am of course in clinic only a few steps away and if they want to meet me I will make every effort to meet with them here at the clinic today.  In any case Brooke [name] is going to return to see me on 05/15/2020.  I will call [name] once she has been transferred and let them know that she needs to be transported here that day.  Hopefully we can add palbociclib at that time.  She will then need to see me about 3 weeks later to reassess.  I would vote for her keeping the PICC line for now.  She has very poor access otherwise and they will be able to draw blood through it or give her fluids  if necessary.  The Foley is a matter of convenience but if it gets in the way of her rehab then it might be best to remove it and use a quick wick instead  Greatly appreciate your help to this patient!  Chauncey Cruel, MD 05/12/2020  8:40 AM Medical Oncology and Hematology [facility] [address] [place], [place] [address] Tel. [phone]    Fax. [phone]

## 2020-05-13 ENCOUNTER — Other Ambulatory Visit: Payer: Self-pay | Admitting: Student

## 2020-05-13 DIAGNOSIS — M6281 Muscle weakness (generalized): Secondary | ICD-10-CM | POA: Diagnosis not present

## 2020-05-13 DIAGNOSIS — I10 Essential (primary) hypertension: Secondary | ICD-10-CM | POA: Diagnosis not present

## 2020-05-13 DIAGNOSIS — D649 Anemia, unspecified: Secondary | ICD-10-CM | POA: Diagnosis not present

## 2020-05-13 DIAGNOSIS — R112 Nausea with vomiting, unspecified: Secondary | ICD-10-CM | POA: Diagnosis not present

## 2020-05-13 DIAGNOSIS — L8990 Pressure ulcer of unspecified site, unspecified stage: Secondary | ICD-10-CM | POA: Diagnosis not present

## 2020-05-13 DIAGNOSIS — D519 Vitamin B12 deficiency anemia, unspecified: Secondary | ICD-10-CM | POA: Diagnosis not present

## 2020-05-13 DIAGNOSIS — N390 Urinary tract infection, site not specified: Secondary | ICD-10-CM | POA: Diagnosis not present

## 2020-05-13 DIAGNOSIS — F419 Anxiety disorder, unspecified: Secondary | ICD-10-CM | POA: Diagnosis not present

## 2020-05-13 DIAGNOSIS — C50919 Malignant neoplasm of unspecified site of unspecified female breast: Secondary | ICD-10-CM | POA: Diagnosis not present

## 2020-05-13 MED ORDER — PALBOCICLIB 75 MG PO TABS: 75.0000 mg | ORAL_TABLET | Freq: Every day | ORAL | 6 refills | Status: DC

## 2020-05-13 NOTE — Telephone Encounter (Signed)
Oral Chemotherapy Pharmacist Encounter   Attempted to reach patient to provide update and offer for initial counseling on oral medication: Ibrance (palbociclib).   No answer. Left voicemail for patient to call back to discuss details of medication acquisition and initial counseling session.  Leron Croak, PharmD, BCPS Hematology/Oncology Clinical Pharmacist [facility] [phone] 05/13/2020 10:38 AM

## 2020-05-13 NOTE — Telephone Encounter (Signed)
Oral Chemotherapy Pharmacist Encounter   Called [facility] to speak to nurse regarding coordination of care for Ibrance being dispensed/administered to patient while at SNF. Left oral chemotherapy clinic phone number [phone]) for RN to return call.  Oral Oncology Clinic will continue to follow.  Leron Croak, PharmD, BCPS Hematology/Oncology Clinical Pharmacist [facility] [phone] 05/13/2020 11:39 AM

## 2020-05-14 DIAGNOSIS — L89154 Pressure ulcer of sacral region, stage 4: Secondary | ICD-10-CM | POA: Diagnosis not present

## 2020-05-14 NOTE — Progress Notes (Signed)
[place]  [phone] [phone]    ID: Brooke [name] DOB: January 09, 1951  MR#: [id number]  DEY#:[id number]  Patient Care Team: [name], MD as PCP - General (Family Medicine) Rolm Bookbinder, MD as Attending Physician (Dermatology) Yolanda Huffstetler, Virgie Dad, MD as Consulting Physician (Oncology) Kyung Rudd, MD as Consulting Physician (Radiation Oncology) Jovita Kussmaul, MD as Consulting Physician (General Surgery) Irene Limbo, MD as Consulting Physician (Plastic Surgery) Delice Bison, Charlestine Massed, NP as Nurse Practitioner (Hematology and Oncology) [name], MD as Consulting Physician (Obstetrics and Gynecology) Laurey Morale, MD as Consulting Physician (Family Medicine) OTHER MD:   CHIEF COMPLAINT: Estrogen receptor positive breast cancer now widely metastatic  CURRENT TREATMENT: anastrozole; palbociclib   INTERVAL HISTORY: Brooke [name] returns today for follow-up and treatment of her stage IV breast cancer, which is weakly estrogen receptor positive.  To review: she presented to the ED on 05/02/2020 with weakness, as well as intermittent nausea and vomiting. She was treated supportively and discharged to [name] on 05/12/2020.  While in the hospital, she underwent whole spine MRI on 05/04/2020 showing: diffuse osseous metastatic disease without pathologic fracture; two small foci of abnormal contrast enhancement along left dorsal aspect of distal spinal cord at T12 level.  A repeat brain MRI was performed on 05/06/2020 showing unchanged findings from prior on 03/26/2020-- right-greater-than-left dural thickening, inter-hemispheric extra-axial mass, and 7 mm extra-axial lesion of left posterior fossa.  She was started on anastrozole on 05/07/2020 while in the hospital. She is tolerating this well with no side effects she is aware of.  She started zoledronate on 03/19/2020, to be repeated every 12 weeks.  However this caused her severe bone  pain.  We will address that when we repeat that in April.   REVIEW OF SYSTEMS: [name] tells me while her family was here last week she did not see much of them because she was in the hospital she says.  She says the family was trying to be positive.  She does not think either her sister from [place] or her sister from [place] or planning to return here anytime soon.  She says some people at [name] are wonderful and specifically mentioned a physical therapy is called [facility].  Other people there are also being helpful.  She says her legs are weak and she simply cannot walk.  Goal #1 is for her to be able to stand and walk.  On the plus side she is now able to drink water without difficulty.  She cannot get down anything sweet.  In the hospital she was having ice cream but that or even Ensure she cannot get down at present.  Goal #2 is to see if we can improve her nutrition.  She tells me that she has diarrheal bowel movements several times a day and she is beginning to develop skin breakdown in her perineum.  Goal #3 is to prevent decubiti.  We are making a few changes in her medications to try to facilitate this as noted below   [place]: fully vaccinated [employer]), with booster 12/28/2019    HISTORY OF CURRENT ILLNESS: From the original intake note:  Brooke [name] herself palpated a change in her left breast and brought this to medical attention.  On 01/18/2017 she underwent bilateral diagnostic mammography with tomography and bilateral breast ultrasonography at [facility].  The breast density was category B.  At the most recent exam was from December 2016.  In the right breast upper outer quadrant  there was a 1 cm irregular mass associated with punctate calcification.  By ultrasound this was not well-defined, and it was biopsied on 01/26/2017 with tomographic guidance this showed a complex sclerosing lesion with the usual ductal hyperplasia.  The right axilla was sonographically  On the  left mammography showed a 4 cm dense mass with indistinct margins in the upper inner quadrant.  Ultrasound defined a 3 cm irregular mass in the upper inner quadrant of the left breast which was palpable.  There was an abnormal lymph node in the left axilla.  Biopsy of the left mass and abnormal lymph node ([id number], on 01/26/2017) showed both to be involved by invasive ductal carcinoma, with some lobular features but E-cadherin positive, estrogen and progesterone receptors both 100% positive, with strong staining intensity, with MIB-1 between 15-20%.  There was no HER-2 amplification, the signals ratio being 1.31/1.58 and the number per cell 2.30/2.05  The patient's subsequent history is as detailed below.   PAST MEDICAL HISTORY: Past Medical History:  Diagnosis Date  . Anemia   . Anxiety   . Arthritis   . ASCUS of cervix with negative high risk HPV 12/2018  . Bilateral dry eyes   . Breast cancer, left breast ([place])   . Family history of breast cancer 02/02/2017  . Family history of breast cancer   . Family history of colon cancer 02/02/2017  . Family history of colon cancer   . Family history of prostate cancer   . Family history of prostate cancer in father 02/02/2017  . Family history of uterine cancer   . Fibroid   . History of kidney stones   . Hyperlipidemia   . Hypertension   . Melanoma of upper arm ([place]) 2009   RIGHT ARM   . Obesity (BMI 30.0-34.9)   . Osteopenia 09/2017   T score -1.9 max 8% / 0.8% statistically significant decline at right and left hip stable at the spine  . PONV (postoperative nausea and vomiting)   . Squamous carcinoma     of the skin  . UTI (urinary tract infection)   . Varicose veins of both lower extremities   . Vitamin D deficiency 07/2009   LOW VITAMIN D 29  . Vitamin deficiency 07/2008   VITAMIN D LOW 23    PAST SURGICAL HISTORY: Past Surgical History:  Procedure Laterality Date  . ABDOMINAL SURGERY  1975   ovairan cyst   . APPENDECTOMY     . BREAST LUMPECTOMY WITH RADIOACTIVE SEED LOCALIZATION Right 09/25/2017   Procedure: RIGHT BREAST LUMPECTOMY WITH RADIOACTIVE SEED LOCALIZATION;  Surgeon: Jovita Kussmaul, MD;  Location: [place];  Service: General;  Laterality: Right;  . BREAST SURGERY     bio  . CHOLECYSTECTOMY    . DERMOID CYST  EXCISION    . DILATATION & CURETTAGE/HYSTEROSCOPY WITH MYOSURE N/A 04/23/2019   Procedure: DILATATION & CURETTAGE/HYSTEROSCOPY WITH MYOSURE;  Surgeon: [name], MD;  Location: [place];  Service: Gynecology;  Laterality: N/A;  request to follow at 9:30am in [facility] block requests 45 minutes  . DILATION AND CURETTAGE OF UTERUS  1998  . GALLBLADDER SURGERY    . HYSTEROSCOPY    . insertion  port a cath  03/02/2016  . MASTECTOMY MODIFIED RADICAL Left 09/25/2017   Procedure: LEFT MODIFIED RADICAL MASTECTOMY;  Surgeon: Jovita Kussmaul, MD;  Location: [place];  Service: General;  Laterality: Left;  . PORTACATH PLACEMENT N/A 03/02/2017   Procedure: INSERTION PORT-A-CATH;  Surgeon:  Jovita Kussmaul, MD;  Location: [place];  Service: General;  Laterality: N/A;  . SKIN BIOPSY    . SKIN CANCER EXCISION  2010,2011   2010-RIGHT ARM, 2011-LEFT LOWER LEG.-DR Rolm Bookbinder DERM. DR. Sarajane Jews IS HER DERM SURGEON.    FAMILY HISTORY Family History  Problem Relation Age of Onset  . Diabetes Father   . Hypertension Father   . Heart disease Father   . Prostate cancer Father 12  . Arthritis Father   . Breast cancer Sister 29       again at age 50, GT results unk  . Skin cancer Sister   . Colon cancer Paternal Grandfather 61  . Hypertension Mother   . Heart failure Mother   . Uterine cancer Sister 36       stage IV  . Breast cancer Cousin        mat first cousin dx under 16  The patient's father was diagnosed with prostate cancer in his 42s and died at age 40 from complications of that disease.  The patient's mother died at age 67 with congestive heart failure.  The patient had no  brothers, 3 sisters.  One sister had breast cancer at age 24 and again at age 38.  She has been genetically tested and was found to be BRCA not mutated in addition a paternal grandfather had colon cancer in his 48s.  There is no history of ovarian cancer in the family to the patient's knowledge.   GYNECOLOGIC HISTORY:  Patient's last menstrual period was 03/18/1997. Menarche age 2. The patient is GX P0.  She stopped having periods in 1999 and took hormone replacement approximately 5 years.   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  She is now retired.  She is single and lives alone with no pets.    ADVANCED DIRECTIVES: Do not return order is in place.  The patient believes she has named her sister [name] as Special educational needs teacher of attorney [phone]).   HEALTH MAINTENANCE: Social History   Tobacco Use  . Smoking status: Never Smoker  . Smokeless tobacco: Never Used  Vaping Use  . Vaping Use: Never used  Substance Use Topics  . Alcohol use: No  . Drug use: No    Colonoscopy: Never  PAP: 12/2019, negative  Bone density: 2017/osteopenia   Allergies  Allergen Reactions  . Neosporin [Neomycin-Bacitracin Zn-Polymyx] Swelling, Rash and Other (See Comments)    Blisters  . Bactrim [Sulfamethoxazole-Trimethoprim]     Made her feel very sick  . Benzalkonium Chloride Other (See Comments)    Redness (Pt is unaware)  It works by killing microorganisms and inhibiting their future growth, and for this reason frequently appears as an ingredient in antibacterial hand wipes, antiseptic creams and anti-itch ointments.  . Latex Rash    Current Outpatient Medications  Medication Sig Dispense Refill  . anastrozole (ARIMIDEX) 1 MG tablet Take 1 tablet (1 mg total) by mouth daily.    Marland Kitchen dronabinol (MARINOL) 2.5 MG capsule Take 1 capsule (2.5 mg total) by mouth daily before lunch.     . furosemide (LASIX) 20 MG tablet Take 1 tablet (20 mg total) by mouth every other day. 30 tablet   . hydrocortisone cream 1 % Apply topically 2 (two) times daily. 30 g 0  . losartan (COZAAR) 25 MG tablet Take 0.5 tablets (12.5 mg total) by mouth daily.    Marland Kitchen  metoCLOPramide (REGLAN) 10 MG tablet Take 1 tablet (10 mg total) by mouth every 8 (eight) hours as needed for nausea. 30 tablet 0  . ondansetron (ZOFRAN-ODT) 4 MG disintegrating tablet Take 1 tablet (4 mg total) by mouth every 8 (eight) hours as needed for nausea or vomiting. 20 tablet 0  . palbociclib (IBRANCE) 75 MG tablet Take 1 tablet (75 mg total) by mouth daily. Take for 21 days on, 7 days off, repeat every 28 days. 21 tablet 6  . pantoprazole (PROTONIX) 40 MG tablet Take 1 tablet (40 mg total) by mouth daily. 30 tablet 0  . potassium chloride (KLOR-CON) 10 MEQ tablet Take 40 mEq by mouth daily.    . prochlorperazine (COMPAZINE) 10 MG tablet Take 1 tablet (10 mg total) by mouth every 6 (six) hours as needed (Nausea or vomiting). 30 tablet 1  . tamsulosin (FLOMAX) 0.4 MG CAPS capsule Take 1 capsule (0.4 mg total) by mouth daily. 30 capsule   . vitamin B-12 (CYANOCOBALAMIN) 1000 MCG tablet Take 1 tablet (1,000 mcg total) by mouth daily.     No current facility-administered medications for this visit.   Facility-Administered Medications Ordered in Other Visits  Medication Dose Route Frequency Provider Last Rate Last Admin  . sodium chloride flush (NS) 0.9 % injection 10 mL  10 mL Intravenous PRN Ramell Wacha, Virgie Dad, MD   10 mL at 05/15/20 1431     OBJECTIVE:  white woman examined in a wheelchair Vitals:   05/15/20 1407  BP: (!) 101/56  Pulse: 95  Resp: 18  Temp: 98.1 F (36.7 C)  SpO2: 99%     There is no height or weight on file to calculate BMI.   Wt Readings from Last 3 Encounters:  05/02/20 182 lb (82.6 kg)  04/30/20 185 lb 3.2 oz (84 kg)  04/20/20 184 lb (83.5 kg)  ECOG FS:1 - Symptomatic but completely ambulatory    Sclerae unicteric, EOMs intact Wearing a mask No cervical or supraclavicular adenopathy Lungs no rales or rhonchi Heart regular rate and rhythm Abd soft, nontender, positive bowel sounds MSK no focal spinal tenderness Neuro: nonfocal, well oriented, appropriate affect Breasts: The right breast shows no masses and no skin or nipple changes of concern.  The left breast is status post mastectomy with no evidence of local recurrence.   LAB RESULTS:  CMP     Component Value Date/Time   NA 137 05/15/2020 1350   NA 141 03/03/2017 1358   K 3.0 (L) 05/15/2020 1350   K 3.3 (L) 03/03/2017 1358   CL 99 05/15/2020 1350   CO2 23 05/15/2020 1350   CO2 28 03/03/2017 1358   GLUCOSE 100 (H) 05/15/2020 1350   GLUCOSE 107 03/03/2017 1358   BUN 10 05/15/2020 1350   BUN 12.7 03/03/2017 1358   CREATININE 0.61 05/15/2020 1350   CREATININE 0.83 02/19/2020 0936   CREATININE 0.9 03/03/2017 1358   CALCIUM 7.9 (L) 05/15/2020 1350   CALCIUM 9.4 03/03/2017 1358   PROT 4.6 (L) 05/15/2020 1350   PROT 6.5 03/03/2017 1358   ALBUMIN 2.5 (L) 05/15/2020 1350   ALBUMIN 3.7 03/03/2017 1358   AST 79 (H) 05/15/2020 1350   AST 43 (H) 09/12/2018 1354   AST 23 03/03/2017 1358   ALT 34 05/15/2020 1350   ALT 84 (H) 09/12/2018 1354   ALT 25 03/03/2017 1358   ALKPHOS 275 (H) 05/15/2020 1350   ALKPHOS 71 03/03/2017 1358   BILITOT 2.0 (H) 05/15/2020 1350   BILITOT 0.6 09/12/2018  1354   BILITOT 0.52 03/03/2017 1358   GFRNONAA >60 05/15/2020 1350   GFRNONAA >60 09/12/2018 1354   GFRAA >60 06/12/2019 1401   GFRAA >60 09/12/2018 1354    No results found for: TOTALPROTELP, ALBUMINELP, A1GS, A2GS, BETS, BETA2SER, GAMS, MSPIKE, SPEI  No results found for: Nils Pyle, KAPLAMBRATIO  Lab Results  Component Value Date   WBC 8.3 05/15/2020   NEUTROABS 6.3 05/15/2020   HGB 9.6 (L) 05/15/2020   HCT 29.2 (L) 05/15/2020   MCV 90.1 05/15/2020   PLT 141 (L) 05/15/2020      Chemistry      Component  Value Date/Time   NA 137 05/15/2020 1350   NA 141 03/03/2017 1358   K 3.0 (L) 05/15/2020 1350   K 3.3 (L) 03/03/2017 1358   CL 99 05/15/2020 1350   CO2 23 05/15/2020 1350   CO2 28 03/03/2017 1358   BUN 10 05/15/2020 1350   BUN 12.7 03/03/2017 1358   CREATININE 0.61 05/15/2020 1350   CREATININE 0.83 02/19/2020 0936   CREATININE 0.9 03/03/2017 1358      Component Value Date/Time   CALCIUM 7.9 (L) 05/15/2020 1350   CALCIUM 9.4 03/03/2017 1358   ALKPHOS 275 (H) 05/15/2020 1350   ALKPHOS 71 03/03/2017 1358   AST 79 (H) 05/15/2020 1350   AST 43 (H) 09/12/2018 1354   AST 23 03/03/2017 1358   ALT 34 05/15/2020 1350   ALT 84 (H) 09/12/2018 1354   ALT 25 03/03/2017 1358   BILITOT 2.0 (H) 05/15/2020 1350   BILITOT 0.6 09/12/2018 1354   BILITOT 0.52 03/03/2017 1358       No results found for: LABCA2  No components found for: [id number]  No results for input(s): INR in the last 168 hours.  No results found for: LABCA2  Lab Results  Component Value Date   QVZ563 32 03/06/2020    No results found for: OVF643  No results found for: PIR518  Lab Results  Component Value Date   CA2729 1,504.7 (H) 05/08/2020    No components found for: HGQUANT  Lab Results  Component Value Date   CEA1 570.0 (H) 05/08/2020   /  CEA  Date Value Ref Range Status  05/08/2020 570.0 (H) 0.0 - 4.7 ng/mL Final    Comment:    (NOTE)                             Nonsmokers          <3.9                             Smokers             <5.6 [employer] Electrochemiluminescence Immunoassay (ECLIA) Values obtained with different assay methods or kits cannot be used interchangeably.  Results cannot be interpreted as absolute evidence of the presence or absence of malignant disease. Performed At: [facility] [place], [place] [id number] Rush Farmer MD [id number]      No results found for: AFPTUMOR  No results found for: [place]  No results found for:  HGBA, HGBA2QUANT, HGBFQUANT, HGBSQUAN (Hemoglobinopathy evaluation)   No results found for: LDH  Lab Results  Component Value Date   IRON 52 02/19/2020   TIBC 327 02/19/2020   IRONPCTSAT 16 02/19/2020   (Iron and TIBC)  Lab Results  Component Value Date   FERRITIN 298 (  H) 02/19/2020    Urinalysis    Component Value Date/Time   COLORURINE YELLOW 05/03/2020 0415   APPEARANCEUR HAZY (A) 05/03/2020 0415   LABSPEC 1.011 05/03/2020 0415   PHURINE 6.0 05/03/2020 0415   GLUCOSEU NEGATIVE 05/03/2020 0415   GLUCOSEU NEGATIVE 11/05/2012 1559   HGBUR LARGE (A) 05/03/2020 0415   BILIRUBINUR NEGATIVE 05/03/2020 0415   BILIRUBINUR neg 08/10/2018 1529   KETONESUR 20 (A) 05/03/2020 0415   PROTEINUR 30 (A) 05/03/2020 0415   UROBILINOGEN 0.2 08/10/2018 1529   UROBILINOGEN 0.2 11/05/2012 1559   NITRITE NEGATIVE 05/03/2020 0415   LEUKOCYTESUR LARGE (A) 05/03/2020 0415    STUDIES: DG Abd 1 View  Result Date: 05/02/2020 CLINICAL DATA:  The joint with nausea and vomiting. EXAM: ABDOMEN - 1 VIEW COMPARISON:  Radiograph 04/20/2020, but CT 03/05/2020 FINDINGS: No evidence of free air on this supine view. Slight increased air throughout not abnormally distended colon. Probable barium in the appendix. There is a 2.2 cm stone in the region of the right renal pelvis. Granulomatous calcifications of the liver and spleen. Cholecystectomy clips in the right upper quadrant. Known osseous metastatic disease on prior PET is not well visualized by radiograph. IMPRESSION: 1. Nonobstructive bowel gas pattern. Mild increased air throughout the colon without abnormal distension is nonspecific in the setting. 2. Right renal stone. Electronically Signed   By: Keith Rake M.D.   On: 05/02/2020 20:13   CT Head Wo Contrast  Result Date: 05/02/2020 CLINICAL DATA:  70 year old female with metastatic breast cancer. Evaluate for brain metastases. EXAM: CT HEAD WITHOUT CONTRAST TECHNIQUE: Contiguous axial images were  obtained from the base of the skull through the vertex without intravenous contrast. COMPARISON:  03/26/2020 brain MR FINDINGS: Brain: Parasagittal mass along the mid falx (series 2: Image 22-23) does not appear significantly changed from 03/26/2020 MR. The LEFT cerebellar lesion identified on recent MR is difficult to visualize on this study. No new masses/metastatic disease is identified on this noncontrast study. There is no evidence of acute infarct, midline shift, hydrocephalus or hemorrhage. Vascular: No hyperdense vessel or unexpected calcification. Skull: The known skull lesions identified on recent MR are difficult to visualize on this study. Sinuses/Orbits: No acute abnormality Other: None IMPRESSION: 1. No acute abnormality. No new masses/metastatic disease on this noncontrast study. 2. No significant change in parafalcine mass/metastasis. 3. The LEFT cerebellar lesion and skull lesions identified on recent MR are difficult to visualize on this study. Electronically Signed   By: Margarette Canada M.D.   On: 05/02/2020 16:01   MR BRAIN W WO CONTRAST  Result Date: 05/06/2020 CLINICAL DATA:  Staging of intracranial meningeal carcinomatosis. EXAM: MRI HEAD WITHOUT AND WITH CONTRAST TECHNIQUE: Multiplanar, multiecho pulse sequences of the brain and surrounding structures were obtained without and with intravenous contrast. CONTRAST:  73m GADAVIST GADOBUTROL 1 MMOL/ML IV SOLN COMPARISON:  03/26/2020 FINDINGS: Brain: No acute infarct, mass effect or extra-axial collection. No acute or chronic hemorrhage. 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 The midline structures are normal. There is unchanged dural thickening, right greater than left. The thickest portion is at the anterior right convexity, measuring 6 mm, unchanged. There is a 7 mm extra-axial mass within the left posterior fossa that is unchanged (17:7. A multilobular inter hemispheric lesion measures 3.6 x 1.0 cm, unchanged. Vascular: Major flow voids are  preserved. Skull and upper cervical spine: Generally low T1-weighted signal throughout the calvarium Sinuses/Orbits:No paranasal sinus fluid levels or advanced mucosal thickening. No mastoid or middle ear effusion. Normal orbits. Other:  Unchanged appearance of numerous subcutaneous lesions. IMPRESSION: 1. Unchanged right-greater-than-left dural thickening, which is greatest at the anterior right convexity. 2. Unchanged inter hemispheric extra-axial mass, likely metastatic disease. 3. Unchanged 7 mm extra-axial lesion of the left posterior fossa, which may be a meningioma or a focus of metastatic disease. Electronically Signed   By: Ulyses Jarred M.D.   On: 05/06/2020 22:44   MR CERVICAL SPINE W WO CONTRAST  Result Date: 05/04/2020 CLINICAL DATA:  Breast carcinoma. Difficulty swallowing and lower extremity weakness. EXAM: MRI TOTAL SPINE WITHOUT AND WITH CONTRAST TECHNIQUE: Multisequence MR imaging of the spine from the cervical spine to the sacrum was performed prior to and following IV contrast administration for evaluation of spinal metastatic disease. CONTRAST:  53m GADAVIST GADOBUTROL 1 MMOL/ML IV SOLN COMPARISON:  None. FINDINGS: MRI CERVICAL SPINE FINDINGS Alignment: Physiologic. Vertebrae: There is diffuse abnormal signal throughout the cervical vertebral column. Lesions involve the vertebral bodies and the spinous processes. There is no compression fracture. Cord: No abnormal enhancement in the spinal cord. No epidural enhancement. Posterior Fossa, vertebral arteries, paraspinal tissues: Incompletely visualized dural-based mass in the left posterior fossa, unchanged. Disc levels: Degenerative disc disease greatest at C5-6 and C6-7 without spinal canal stenosis. MRI THORACIC SPINE FINDINGS Alignment:  Physiologic. Vertebrae: Diffusely abnormal signal within the thoracic vertebral column with greatest lesion at T9. No compression fracture. Cord: There are 2 small foci of abnormal contrast enhancement along  the left dorsal aspect of the distal spinal cord at the T12 level. No epidural enhancement. Paraspinal and other soft tissues: Negative Disc levels: No spinal canal stenosis MRI LUMBAR SPINE FINDINGS Segmentation:  Normal Alignment:  Normal Vertebrae: Diffusely abnormal bone marrow signal. No compression fracture. Conus medullaris: Extends to the L1 level and appears normal aside from the lesions at the T12 level described above. Paraspinal and other soft tissues: Negative Disc levels: No spinal canal stenosis. IMPRESSION: 1. Diffuse osseous metastatic disease without pathologic fracture. 2. 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 3. No spinal canal stenosis. Electronically Signed   By: KUlyses JarredM.D.   On: 05/04/2020 22:58   MR THORACIC SPINE W WO CONTRAST  Result Date: 05/04/2020 CLINICAL DATA:  Breast carcinoma. Difficulty swallowing and lower extremity weakness. EXAM: MRI TOTAL SPINE WITHOUT AND WITH CONTRAST TECHNIQUE: Multisequence MR imaging of the spine from the cervical spine to the sacrum was performed prior to and following IV contrast administration for evaluation of spinal metastatic disease. CONTRAST:  854mGADAVIST GADOBUTROL 1 MMOL/ML IV SOLN COMPARISON:  None. FINDINGS: MRI CERVICAL SPINE FINDINGS Alignment: Physiologic. Vertebrae: There is diffuse abnormal signal throughout the cervical vertebral column. Lesions involve the vertebral bodies and the spinous processes. There is no compression fracture. Cord: No abnormal enhancement in the spinal cord. No epidural enhancement. Posterior Fossa, vertebral arteries, paraspinal tissues: Incompletely visualized dural-based mass in the left posterior fossa, unchanged. Disc levels: Degenerative disc disease greatest at C5-6 and C6-7 without spinal canal stenosis. MRI THORACIC SPINE FINDINGS Alignment:  Physiologic. Vertebrae:  Diffusely abnormal signal within the thoracic vertebral column with greatest lesion at T9. No compression fracture. Cord: There are 2 small foci of abnormal contrast enhancement along the left dorsal aspect of the distal spinal cord at the T12 level. No epidural enhancement. Paraspinal and other soft tissues: Negative Disc levels: No spinal canal stenosis MRI LUMBAR SPINE FINDINGS Segmentation:  Normal Alignment:  Normal Vertebrae:  Diffusely abnormal bone marrow signal. No compression fracture. Conus medullaris: Extends to the L1 level and appears normal aside from the lesions at the T12 level described above. Paraspinal and other soft tissues: Negative Disc levels: No spinal canal stenosis. IMPRESSION: 1. Diffuse osseous metastatic disease without pathologic fracture. 2. 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 3. No spinal canal stenosis. Electronically Signed   By: Ulyses Jarred M.D.   On: 05/04/2020 22:58   MR Lumbar Spine W Wo Contrast  Result Date: 05/04/2020 CLINICAL DATA:  Breast carcinoma. Difficulty swallowing and lower extremity weakness. EXAM: MRI TOTAL SPINE WITHOUT AND WITH CONTRAST TECHNIQUE: Multisequence MR imaging of the spine from the cervical spine to the sacrum was performed prior to and following IV contrast administration for evaluation of spinal metastatic disease. CONTRAST:  64m GADAVIST GADOBUTROL 1 MMOL/ML IV SOLN COMPARISON:  None. FINDINGS: MRI CERVICAL SPINE FINDINGS Alignment: Physiologic. Vertebrae: There is diffuse abnormal signal throughout the cervical vertebral column. Lesions involve the vertebral bodies and the spinous processes. There is no compression fracture. Cord: No abnormal enhancement in the spinal cord. No epidural enhancement. Posterior Fossa, vertebral arteries, paraspinal tissues: Incompletely visualized dural-based mass in the left posterior  fossa, unchanged. Disc levels: Degenerative disc disease greatest at C5-6 and C6-7 without spinal canal stenosis. MRI THORACIC SPINE FINDINGS Alignment:  Physiologic. Vertebrae: Diffusely abnormal signal within the thoracic vertebral column with greatest lesion at T9. No compression fracture. Cord: There are 2 small foci of abnormal contrast enhancement along the left dorsal aspect of the distal spinal cord at the T12 level. No epidural enhancement. Paraspinal and other soft tissues: Negative Disc levels: No spinal canal stenosis MRI LUMBAR SPINE FINDINGS Segmentation:  Normal Alignment:  Normal Vertebrae: Diffusely abnormal bone marrow signal. No compression fracture. Conus medullaris: Extends to the L1 level and appears normal aside from the lesions at the T12 level described above. Paraspinal and other soft tissues: Negative Disc levels: No spinal canal stenosis. IMPRESSION: 1. Diffuse osseous metastatic disease without pathologic fracture. 2. 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 3. No spinal canal stenosis. Electronically Signed   By: KUlyses JarredM.D.   On: 05/04/2020 22:58   DG Chest Port 1 View  Result Date: 05/02/2020 CLINICAL DATA:  Weakness.  Nausea. EXAM: PORTABLE CHEST 1 VIEW COMPARISON:  Chest CT 02/27/2020 FINDINGS: Tip of the right upper extremity PICC in the SVC. Heart is borderline enlarged. Mild hilar prominence which may be due to adenopathy. There also calcified mediastinal and left hilar nodes. Many of the pulmonary nodules on prior CT are not seen. There is a calcified granuloma at the left lung base. No confluent consolidation. No pleural fluid. No pneumothorax. Postsurgical change of the left axilla and chest wall. Known osseous metastatic disease is not well detected by radiograph. IMPRESSION: 1. No acute findings. 2. Bilateral hilar prominence may be due to  adenopathy in this patient with known metastatic breast cancer. 3. Right upper extremity PICC tip in the SVC. Electronically Signed   By: MKeith RakeM.D.   On: 05/02/2020 15:08   DG Abdomen Acute W/Chest  Result Date: 04/20/2020 CLINICAL DATA:  Vomiting cancer EXAM: DG ABDOMEN ACUTE WITH 1 VIEW CHEST COMPARISON:  CT chest 02/27/2020, PET CT 03/05/2020 FINDINGS: Single view chest demonstrates right  upper extremity central venous catheter tip at the cavoatrial junction. Postsurgical changes of left chest and axilla. No focal opacity or pleural effusion. Normal cardiomediastinal silhouette. Supine and upright views of the abdomen demonstrate no free air beneath the diaphragm. Surgical clips in the right upper quadrant. Multiple round calcifications in the left upper quadrant consistent with splenic granuloma. Calcified left lung base granuloma. Nonobstructed gas pattern. Staghorn calculus right kidney. Known skeletal metastatic disease is better seen on PET CT. IMPRESSION: Nonobstructed gas pattern. Staghorn calculus right kidney. No radiographic evidence for acute cardiopulmonary abnormality. Electronically Signed   By: Donavan Foil M.D.   On: 04/20/2020 16:21   ECHOCARDIOGRAM COMPLETE  Result Date: 05/07/2020    ECHOCARDIOGRAM REPORT   Patient Name:   Brooke [name] Date of Exam: 05/07/2020 Medical Rec #:  [id number]      Height:       69.0 in Accession #:    [id number]     Weight:       182.0 lb Date of Birth:  [birth year]     BSA:          1.985 m Patient Age:    65 years       BP:           107/65 mmHg Patient Gender: F              HR:           103 bpm. Exam Location:  Inpatient Procedure: 2D Echo, Cardiac Doppler and Color Doppler Indications:    CHF  History:        Patient has no prior history of Echocardiogram examinations.                 Risk Factors:Hypertension and Dyslipidemia. Breast cancer,                 chemo, radiation.  Sonographer:    Dustin Flock Referring Phys: [id number] [place]  [place] IMPRESSIONS  1. Left ventricular ejection fraction, by estimation, is 60 to 65%. The left ventricle has normal function. The left ventricle has no regional wall motion abnormalities. Left ventricular diastolic parameters were normal.  2. Right ventricular systolic function is normal. The right ventricular size is normal. There is mildly elevated pulmonary artery systolic pressure.  3. The mitral valve is normal in structure. Trivial mitral valve regurgitation. No evidence of mitral stenosis.  4. The aortic valve is tricuspid. Aortic valve regurgitation is not visualized. No aortic stenosis is present.  5. The inferior vena cava is normal in size with greater than 50% respiratory variability, suggesting right atrial pressure of 3 mmHg. Comparison(s): No prior Echocardiogram. Conclusion(s)/Recommendation(s): Normal biventricular function without evidence of hemodynamically significant valvular heart disease. FINDINGS  Left Ventricle: Left ventricular ejection fraction, by estimation, is 60 to 65%. The left ventricle has normal function. The left ventricle has no regional wall motion abnormalities. The left ventricular internal cavity size was normal in size. There is  no left ventricular hypertrophy. Left ventricular diastolic parameters were normal. Right Ventricle: The right ventricular size is normal. No increase in right ventricular wall thickness. Right ventricular systolic function is normal. There is mildly elevated pulmonary artery systolic pressure. The tricuspid regurgitant velocity is 2.97  m/s, and with an assumed right atrial pressure of 3 mmHg, the estimated right ventricular systolic pressure is 65.4 mmHg. Left Atrium: Left atrial size was normal in size. Right Atrium: Right atrial size was normal in size. Pericardium: There is no evidence of  pericardial effusion. Mitral Valve: The mitral valve is normal in structure. Trivial mitral valve regurgitation. No evidence of mitral valve stenosis. Tricuspid  Valve: The tricuspid valve is normal in structure. Tricuspid valve regurgitation is trivial. No evidence of tricuspid stenosis. Aortic Valve: The aortic valve is tricuspid. Aortic valve regurgitation is not visualized. No aortic stenosis is present. Aortic valve peak gradient measures 10.8 mmHg. Pulmonic Valve: The pulmonic valve was not well visualized. Pulmonic valve regurgitation is not visualized. No evidence of pulmonic stenosis. Aorta: The aortic root, ascending aorta, aortic arch and descending aorta are all structurally normal, with no evidence of dilitation or obstruction. Venous: The inferior vena cava is normal in size with greater than 50% respiratory variability, suggesting right atrial pressure of 3 mmHg. IAS/Shunts: The atrial septum is grossly normal.  LEFT VENTRICLE PLAX 2D LVIDd:         4.80 cm  Diastology LVIDs:         2.60 cm  LV e' medial:    7.83 cm/s LV PW:         1.00 cm  LV E/e' medial:  10.0 LV IVS:        1.10 cm  LV e' lateral:   14.30 cm/s LVOT diam:     2.00 cm  LV E/e' lateral: 5.5 LV SV:         79 LV SV Index:   40 LVOT Area:     3.14 cm  RIGHT VENTRICLE RV Basal diam:  3.20 cm RV S prime:     12.30 cm/s TAPSE (M-mode): 1.9 cm LEFT ATRIUM             Index       RIGHT ATRIUM           Index LA diam:        2.70 cm 1.36 cm/m  RA Area:     12.60 cm LA Vol (A2C):   30.2 ml 15.22 ml/m RA Volume:   28.10 ml  14.16 ml/m LA Vol (A4C):   37.1 ml 18.69 ml/m LA Biplane Vol: 33.8 ml 17.03 ml/m  AORTIC VALVE AV Area (Vmax): 2.45 cm AV Vmax:        164.00 cm/s AV Peak Grad:   10.8 mmHg LVOT Vmax:      128.00 cm/s LVOT Vmean:     77.000 cm/s LVOT VTI:       0.253 m  AORTA Ao Root diam: 3.00 cm MITRAL VALVE               TRICUSPID VALVE MV Area (PHT): 5.38 cm    TR Peak grad:   35.3 mmHg MV Decel Time: 141 msec    TR Vmax:        297.00 cm/s MV E velocity: 78.20 cm/s MV A velocity: 72.20 cm/s  SHUNTS MV E/A ratio:  1.08        Systemic VTI:  0.25 m                            Systemic  Diam: 2.00 cm Buford Dresser MD Electronically signed by Buford Dresser MD Signature Date/Time: 05/07/2020/3:47:58 PM    Final    DG ESOPHAGUS W SINGLE CM (SOL OR THIN BA)  Result Date: 04/23/2020 CLINICAL DATA:  Dysphagia. EXAM: ESOPHOGRAM/BARIUM SWALLOW TECHNIQUE: Single contrast examination was performed using  thin barium. FLUOROSCOPY TIME:  Radiation Exposure Index (if provided by the fluoroscopic device): 17.9 mGy. COMPARISON:  None. FINDINGS: Exam is limited as the patient only drank a small amount of contrast. No definite mass or stricture is noted in the esophagus small sliding-type hiatal hernia is noted. No definite reflux is noted. Barium tablet could not be administered as the patient could not be placed in upright position. IMPRESSION: Small sliding-type hiatal hernia. No definite mass or stricture is noted in the esophagus. Electronically Signed   By: Marijo Conception M.D.   On: 04/23/2020 09:15     ELIGIBLE FOR AVAILABLE RESEARCH PROTOCOL: no   ASSESSMENT: 70 y.o. Brooke [name] woman status post bilateral biopsies 01/26/2017, showing  (1) in the right breast, a complex sclerosing lesion, status post lumpectomy 09/25/2017, with no malignancy identified.  (2) in the left breast, a cT2 pN1 invasive ductal carcinoma (with some lobular features but E-cadherin positive), grade 2, estrogen and progesterone receptor positive, HER-2 not amplified, with an MIB-1 of 15-20%  (a) breast MRI 02/04/2017 suggests a T3 N1 tumor  (3) started neoadjuvant cyclophosphamide/docetaxel 03/03/2017, discontinued after 1 cycle with very poor tolerance  (a) started cyclophosphamide/methotrexate/fluorouracil (CMF) 03/28/2017, repeated x7 cycles, last dose 08/01/2017  (4) status post left modified radical mastectomy 09/25/17 for a ypT5 ypN2, stage IIIA invasive ductal carcinoma, grade 2, again estrogen and progesterone receptor positive, HER-2 not amplified  (5) adjuvant radiation completed  12/21/2017  (a) capecitabine sensitization tolerated only the initial week of radiation (b) Site/dose: The patient initially received a dose of 50.4 Gy in 28 fractions to the left chest wall and left supraclavicular region. This was delivered using a 3-D conformal, 4 field technique. The patient then received a boost to the mastectomy scar. This delivered an additional 10 Gy in 5 fractions using an en face electron field. The total dose was 60.4 Gy.  ( 6) tamoxifen started 03/09/2018, discontinued January 2022 with metastatic progression  (7) genetics testing 12/12/2017 through the Multi-Gene Panel offered by [facility] found no deleterious mutations in AIP, ALK, APC, ATM, AXIN2,BAP1,  BARD1, BLM, BMPR1A, BRCA1, BRCA2, BRIP1, CASR, CDC73, CDH1, CDK4, CDKN1B, CDKN1C, CDKN2A (p14ARF), CDKN2A (p16INK4a), CEBPA, CHEK2, CTNNA1, DICER1, DIS3L2, EGFR (c.2369C>T, p.Thr790Met variant only), EPCAM (Deletion/duplication testing only), FH, FLCN, GATA2, GPC3, GREM1 (Promoter region deletion/duplication testing only), HOXB13 (c.251G>A, p.Gly84Glu), HRAS, KIT, MAX, MEN1, MET, MITF (c.952G>A, p.Glu318Lys variant only), MLH1, MSH2, MSH3, MSH6, MUTYH, NBN, NF1, NF2, NTHL1, PALB2, PDGFRA, PHOX2B, PMS2, POLD1, POLE, POT1, PRKAR1A, PTCH1, PTEN, RAD50, RAD51C, RAD51D, RB1, RECQL4, RET, RUNX1, SDHAF2, SDHA (sequence changes only), SDHB, SDHC, SDHD, SMAD4, SMARCA4, SMARCB1, SMARCE1, STK11, SUFU, TERC, TERT, TMEM127, TP53, TSC1, TSC2, VHL, WRN and WT1.   (8) Foundation 1 testing shows stable microsatellite status and 1 mutation/ Mb, TP53 mutations x2  (a) PD-L1 dated July 2019 negative  METASTATIC DISEASE: January 2022 (9) CT angio of the chest 02/27/2020 shows multiple hilar nodes consistent with prior granulomatous infection, multiple small pulmonary nodules, multiple punctate granulomas throughout the liver and spleen, nodular hepatic contour and areas of mottling possibly due to steatosis, 1.6 cm left adrenal nodule  (a) PET  scan 03/05/2020 shows mildly to highly hypermetabolic adenopathy, the pulmonary nodules being mildly to not hypermetabolic, a large right hepatic liver lesion with an SUV of 13.2 and additional hypermetabolic right and left lobe foci, the adrenal lesion being hypermetabolic with an SUV of 16.  There are also peritoneal nodules and innumerable foci of bony involvement.  Incidental note was made of small heterogeneous foci of increased uptake in the cerebellum.  The CT images did not show gross abnormality  in the visualized brain  (b) on 03/09/2020 CA 27-29 was 983.0, CEA 744.17, the CA 19-9 normal at 32  (c) liver biopsy 03/13/2020 confirms metastati carcinoma, estrogen receptor positive, progesterone and HER 2 negative, with an Mib-1 of 40%  (d) brain MRI 03/26/2020 shows dural involvement including a 1.0 cm dural-based cerebellar lesion  (10) paclitaxel started 03/30/2020, to be repeated weekly as tolerated  (11) zoledronate started 03/19/2020, to be repeated every 12 weeks  (12)anastrozole started 05/07/2020             (a) palbociclib 125 mg daily, 21 days off, 7 days off, started 05/17/2018   PLAN: Latania is certainly improved as compared to when she came to the hospital but she is still a long way from being able to return to her home and do her own activities of daily living.  I am hopeful with some rehab at [facility] may recover some of her function.  Mariesha understands that she has incurable breast cancer.  However the cancer is treatable.  She is already on anastrozole.  We know that CDK inhibitors like palbociclib/Ibrance can increase the effectiveness of antiestrogens.  Accordingly we have been able to obtain palbociclib for her.  We are giving her her first cycle, 21 tablets, in hand today and she will start them tomorrow.  She receives 1 tablet daily for 3 weeks and then she is off for 1 week.  She then returns to see [place] and we can check counts and assess tolerance at that time.  I  am stopping her Protonix because there is an interaction with the palbociclib.  I am substituting Pepcid.  I am stopping the metoclopramide given her diarrhea problems.  I am requesting a nutrition assessment since she continues to have significant swallowing issues and food preferences, decubitus prophylaxis and routine PICC care.  She received a dose of B12 today and her PICC line was flushed.  She will return to see [place] on 06/11/2020 at 12:30PM.  A copy of this note together with the specific orders, labs, and the palbociclib is being given to the patient to take back to [facility].  Total encounter time 40 minutes.*   , Virgie Dad, MD  05/15/20 3:14 PM Medical Oncology and Hematology [facility] [place], [place] [address] Tel. [phone]    Fax. [phone]   I, Wilburn Mylar, am acting as scribe for Dr. Virgie Dad. .  I, Lurline Del MD, have reviewed the above documentation for accuracy and completeness, and I agree with the above.   *Total Encounter Time as defined by the Centers for Medicare and Medicaid Services includes, in addition to the face-to-face time of a patient visit (documented in the note above) non-face-to-face time: obtaining and reviewing outside history, ordering and reviewing medications, tests or procedures, care coordination (communications with other health care professionals or caregivers) and documentation in the medical record.

## 2020-05-15 ENCOUNTER — Inpatient Hospital Stay: Payer: Medicare PPO

## 2020-05-15 ENCOUNTER — Other Ambulatory Visit: Payer: Self-pay

## 2020-05-15 ENCOUNTER — Telehealth: Payer: Self-pay

## 2020-05-15 ENCOUNTER — Inpatient Hospital Stay: Payer: Medicare PPO | Admitting: Oncology

## 2020-05-15 VITALS — BP 86/50 | HR 92 | Temp 98.1°F | Resp 16

## 2020-05-15 VITALS — BP 101/56 | HR 95 | Temp 98.1°F | Resp 18

## 2020-05-15 DIAGNOSIS — Z803 Family history of malignant neoplasm of breast: Secondary | ICD-10-CM | POA: Diagnosis not present

## 2020-05-15 DIAGNOSIS — Z170 Estrogen receptor positive status [ER+]: Secondary | ICD-10-CM | POA: Diagnosis not present

## 2020-05-15 DIAGNOSIS — R339 Retention of urine, unspecified: Secondary | ICD-10-CM | POA: Diagnosis not present

## 2020-05-15 DIAGNOSIS — N390 Urinary tract infection, site not specified: Secondary | ICD-10-CM | POA: Diagnosis not present

## 2020-05-15 DIAGNOSIS — C787 Secondary malignant neoplasm of liver and intrahepatic bile duct: Secondary | ICD-10-CM | POA: Diagnosis not present

## 2020-05-15 DIAGNOSIS — Z452 Encounter for adjustment and management of vascular access device: Secondary | ICD-10-CM | POA: Diagnosis not present

## 2020-05-15 DIAGNOSIS — Z8249 Family history of ischemic heart disease and other diseases of the circulatory system: Secondary | ICD-10-CM | POA: Diagnosis not present

## 2020-05-15 DIAGNOSIS — C50812 Malignant neoplasm of overlapping sites of left female breast: Secondary | ICD-10-CM

## 2020-05-15 DIAGNOSIS — C7951 Secondary malignant neoplasm of bone: Secondary | ICD-10-CM | POA: Diagnosis not present

## 2020-05-15 DIAGNOSIS — G9340 Encephalopathy, unspecified: Secondary | ICD-10-CM | POA: Diagnosis not present

## 2020-05-15 DIAGNOSIS — Z79811 Long term (current) use of aromatase inhibitors: Secondary | ICD-10-CM | POA: Diagnosis not present

## 2020-05-15 DIAGNOSIS — Z9012 Acquired absence of left breast and nipple: Secondary | ICD-10-CM | POA: Diagnosis not present

## 2020-05-15 DIAGNOSIS — E46 Unspecified protein-calorie malnutrition: Secondary | ICD-10-CM | POA: Diagnosis not present

## 2020-05-15 DIAGNOSIS — C50912 Malignant neoplasm of unspecified site of left female breast: Secondary | ICD-10-CM | POA: Diagnosis not present

## 2020-05-15 DIAGNOSIS — Z95828 Presence of other vascular implants and grafts: Secondary | ICD-10-CM

## 2020-05-15 DIAGNOSIS — R600 Localized edema: Secondary | ICD-10-CM | POA: Diagnosis not present

## 2020-05-15 DIAGNOSIS — Z8042 Family history of malignant neoplasm of prostate: Secondary | ICD-10-CM | POA: Diagnosis not present

## 2020-05-15 DIAGNOSIS — M8580 Other specified disorders of bone density and structure, unspecified site: Secondary | ICD-10-CM | POA: Diagnosis not present

## 2020-05-15 DIAGNOSIS — D696 Thrombocytopenia, unspecified: Secondary | ICD-10-CM | POA: Diagnosis not present

## 2020-05-15 DIAGNOSIS — Z79899 Other long term (current) drug therapy: Secondary | ICD-10-CM | POA: Diagnosis not present

## 2020-05-15 DIAGNOSIS — I10 Essential (primary) hypertension: Secondary | ICD-10-CM | POA: Diagnosis not present

## 2020-05-15 DIAGNOSIS — D0361 Melanoma in situ of right upper limb, including shoulder: Secondary | ICD-10-CM

## 2020-05-15 DIAGNOSIS — C50919 Malignant neoplasm of unspecified site of unspecified female breast: Secondary | ICD-10-CM | POA: Diagnosis not present

## 2020-05-15 DIAGNOSIS — Z800 Family history of malignant neoplasm of digestive organs: Secondary | ICD-10-CM | POA: Diagnosis not present

## 2020-05-15 DIAGNOSIS — Z8049 Family history of malignant neoplasm of other genital organs: Secondary | ICD-10-CM | POA: Diagnosis not present

## 2020-05-15 LAB — COMPREHENSIVE METABOLIC PANEL WITH GFR
ALT: 34 U/L (ref 0–44)
AST: 79 U/L — ABNORMAL HIGH (ref 15–41)
Albumin: 2.5 g/dL — ABNORMAL LOW (ref 3.5–5.0)
Alkaline Phosphatase: 275 U/L — ABNORMAL HIGH (ref 38–126)
Anion gap: 15 (ref 5–15)
BUN: 10 mg/dL (ref 8–23)
CO2: 23 mmol/L (ref 22–32)
Calcium: 7.9 mg/dL — ABNORMAL LOW (ref 8.9–10.3)
Chloride: 99 mmol/L (ref 98–111)
Creatinine, Ser: 0.61 mg/dL (ref 0.44–1.00)
GFR, Estimated: >60 mL/min (ref >60)
Glucose, Bld: 100 mg/dL — ABNORMAL HIGH (ref 70–99)
Potassium: 3 mmol/L — ABNORMAL LOW (ref 3.5–5.1)
Sodium: 137 mmol/L (ref 135–145)
Total Bilirubin: 2 mg/dL — ABNORMAL HIGH (ref 0.3–1.2)
Total Protein: 4.6 g/dL — ABNORMAL LOW (ref 6.5–8.1)

## 2020-05-15 LAB — CBC WITH DIFFERENTIAL/PLATELET
Abs Immature Granulocytes: 0.35 10*3/uL — ABNORMAL HIGH (ref 0.00–0.07)
Basophils Absolute: 0 10*3/uL (ref 0.0–0.1)
Basophils Relative: 0 %
Eosinophils Absolute: 0.1 10*3/uL (ref 0.0–0.5)
Eosinophils Relative: 1 %
HCT: 29.2 % — ABNORMAL LOW (ref 36.0–46.0)
Hemoglobin: 9.6 g/dL — ABNORMAL LOW (ref 12.0–15.0)
Immature Granulocytes: 4 %
Lymphocytes Relative: 12 %
Lymphs Abs: 1 10*3/uL (ref 0.7–4.0)
MCH: 29.6 pg (ref 26.0–34.0)
MCHC: 32.9 g/dL (ref 30.0–36.0)
MCV: 90.1 fL (ref 80.0–100.0)
Monocytes Absolute: 0.5 10*3/uL (ref 0.1–1.0)
Monocytes Relative: 7 %
Neutro Abs: 6.3 10*3/uL (ref 1.7–7.7)
Neutrophils Relative %: 76 %
Platelets: 141 10*3/uL — ABNORMAL LOW (ref 150–400)
RBC: 3.24 MIL/uL — ABNORMAL LOW (ref 3.87–5.11)
RDW: 23.7 % — ABNORMAL HIGH (ref 11.5–15.5)
WBC: 8.3 10*3/uL (ref 4.0–10.5)
nRBC: 1.4 % — ABNORMAL HIGH (ref 0.0–0.2)

## 2020-05-15 MED ORDER — CYANOCOBALAMIN 1000 MCG/ML IJ SOLN
INTRAMUSCULAR | Status: AC
  Filled 2020-05-15: qty 1

## 2020-05-15 MED ORDER — SODIUM CHLORIDE 0.9% FLUSH
10.0000 mL | INTRAVENOUS | Status: DC | PRN
  Administered 2020-05-15: 10 mL via INTRAVENOUS
  Filled 2020-05-15: qty 10

## 2020-05-15 MED ORDER — HEPARIN SOD (PORK) LOCK FLUSH 100 UNIT/ML IV SOLN
500.0000 [IU] | Freq: Once | INTRAVENOUS | Status: AC
  Administered 2020-05-15: 500 [IU] via INTRAVENOUS
  Filled 2020-05-15: qty 5

## 2020-05-15 MED ORDER — CYANOCOBALAMIN 1000 MCG/ML IJ SOLN
1000.0000 ug | Freq: Once | INTRAMUSCULAR | Status: AC
  Administered 2020-05-15: 1000 ug via INTRAMUSCULAR

## 2020-05-15 NOTE — Telephone Encounter (Signed)
Oral Chemotherapy Pharmacist Encounter  I spoke with patient for overview of: Ibrance for the treatment of metastatic, hormone-receptor positive, HER2 receptor negative breast cancer, in combination with anastrozole, planned duration until disease progression or unacceptable toxicity.   Counseled patient on administration, dosing, side effects, monitoring, drug-food interactions, safe handling, storage, and disposal.  Patient will take Ibrance $RemoveBefo'75mg'NloQBezqaql$  tablets, 1 tablet by mouth once daily, with or without food, taken for 3 weeks on, 1 week off, and repeated.  Patient knows to avoid grapefruit and grapefruit juice while on treatment with Ibrance.  Ibrance start date: 05/16/20  Adverse effects include but are not limited to: fatigue, hair loss, GI upset, nausea, decreased blood counts, and increased upper respiratory infections. Severe, life-threatening, and/or fatal interstitial lung disease (ILD) and/or pneumonitis may occur with CDK 4/6 inhibitors.  Patient will obtain anti diarrheal and alert the office of 4 or more loose stools above baseline.  Reviewed with patient importance of keeping a medication schedule and plan for any missed doses. No barriers to medication adherence identified.  Medication reconciliation performed and medication/allergy list updated. Due to drug drug interaction between Ibrance and pantoprazole. Patient agreeable to switching to famotidine.   Insurance authorization for [facility] has been obtained. Test claim at the pharmacy revealed copayment $100 for 1st fill of Ibrance. This copay is unaffordable for patient. Voucher used for first fill of Ibrance while [employer], [employer] assistance.   All questions answered.  Ms. [place] voiced understanding and appreciation.   Medication education handout given to patient. Patient knows to call the office with questions or concerns. Oral Chemotherapy Clinic phone number provided to patient.   Leron Croak, PharmD,  BCPS Hematology/Oncology Clinical Pharmacist [facility] [phone] 05/15/2020 3:30 PM

## 2020-05-15 NOTE — Telephone Encounter (Signed)
Oral Oncology Patient Advocate Encounter  Met patient in [place] to complete application for [name] in an effort to reduce patient's out of pocket expense for Ibrance to $0.    Application completed and faxed to [phone].   [employer] patient assistance phone number for follow up is [phone].   This encounter will be updated until final determination.   [place] Patient [place] Phone [phone] Fax [phone] 05/15/2020 3:49 PM

## 2020-05-15 NOTE — Patient Instructions (Addendum)
PICC Home Care Guide A peripherally inserted central catheter (PICC) is a form of IV access that allows medicines and IV fluids to be quickly distributed throughout the body. The PICC is a long, thin, flexible tube (catheter) that is inserted into a vein in the upper arm. The catheter ends in a large vein in the chest (superior vena cava, or SVC). After the PICC is inserted, a chest X-ray may be done to make sure that it is in the correct place. A PICC may be placed for different reasons, such as:  To give medicines and liquid nutrition.  To give IV fluids and blood products.  If there is trouble placing a peripheral intravenous (PIV) catheter. If taken care of properly, a PICC can remain in place for several months. Having a PICC can also allow a person to go home from the hospital sooner. Medicine and PICC care can be managed at home by a family member, caregiver, or home health care team. What are the risks? Generally, having a PICC is safe. However, problems may occur, including:  A blood clot (thrombus) forming in or at the tip of the PICC.  A blood clot forming in a vein (deep vein thrombosis) or traveling to the lung (pulmonary embolism).  Inflammation of the vein (phlebitis) in which the PICC is placed.  Infection. Central line associated blood stream infection (CLABSI) is a serious infection that often requires hospitalization.  PICC movement (malposition). The PICC tip may move from its original position due to excessive physical activity, forceful coughing, sneezing, or vomiting.  A break or cut in the PICC. It is important not to use scissors near the PICC.  Nerve or tendon irritation or injury during PICC insertion. How to take care of your PICC Preventing problems  You and any caregivers should wash your hands often with soap. Wash hands: ? Before touching the PICC line or the infusion device. ? Before changing a bandage (dressing).  Flush the PICC as told by your  health care provider. Let your health care provider know right away if the PICC is hard to flush or does not flush. Do not use force to flush the PICC.  Do not use a syringe that is less than 10 mL to flush the PICC.  Avoid blood pressure checks on the arm in which the PICC is placed.  Never pull or tug on the PICC.  Do not take the PICC out yourself. Only a trained clinical professional should remove the PICC.  Use clean and sterile supplies only. Keep the supplies in a dry place. Do not reuse needles, syringes, or any other supplies. Doing that can lead to infection.  Keep pets and children away from your PICC line.  Check the PICC insertion site every day for signs of infection. Check for: ? Leakage. ? Redness, swelling, or pain. ? Fluid or blood. ? Warmth. ? Pus or a bad smell. PICC dressing care  Keep your PICC bandage (dressing) clean and dry to prevent infection.  Do not take baths, swim, or use a hot tub until your health care provider approves. Ask your health care provider if you can take showers. You may only be allowed to take sponge baths for bathing. When you are allowed to shower: ? Ask your health care provider to teach you how to wrap the PICC line. ? Cover the PICC line with clear plastic wrap and tape to keep it dry while showering.  Follow instructions from your health care provider about  how to take care of your insertion site and dressing. Make sure you: ? Wash your hands with soap and water before you change your bandage (dressing). If soap and water are not available, use hand sanitizer. ? Change your dressing as told by your health care provider. ? Leave stitches (sutures), skin glue, or adhesive strips in place. These skin closures may need to stay in place for 2 weeks or longer. If adhesive strip edges start to loosen and curl up, you may trim the loose edges. Do not remove adhesive strips completely unless your health care provider tells you to do  that.  Change your PICC dressing if it becomes loose or wet. General instructions  Carry your PICC identification card or wear a medical alert bracelet at all times.  Keep the tube clamped at all times, unless it is being used.  Carry a smooth-edge clamp with you at all times to place on the tube if it breaks.  Do not use scissors or sharp objects near the tube.  You may bend your arm and move it freely. If your PICC is near or at the bend of your elbow, avoid activity with repeated motion at the elbow.  Avoid lifting heavy objects as told by your health care provider.  Keep all follow-up visits as told by your health care provider. This is important.   Disposal of supplies  Throw away any syringes in a disposal container that is meant for sharp items (sharps container). You can buy a sharps container from a pharmacy, or you can make one by using an empty hard plastic bottle with a cover.  Place any used dressings or infusion bags into a plastic bag. Throw that bag in the trash. Contact a health care provider if:  You have pain in your arm, ear, face, or teeth.  You have a fever or chills.  You have redness, swelling, or pain around the insertion site.  You have fluid or blood coming from the insertion site.  Your insertion site feels warm to the touch.  You have pus or a bad smell coming from the insertion site.  Your skin feels hard and raised around the insertion site. Get help right away if:  Your PICC is accidentally pulled all the way out. If this happens, cover the insertion site with a bandage or gauze dressing. Do not throw the PICC away. Your health care provider will need to check it.  Your PICC was tugged or pulled and has partially come out. Do not  push the PICC back in.  You cannot flush the PICC, it is hard to flush, or the PICC leaks around the insertion site when it is flushed.  You hear a "flushing" sound when the PICC is flushed.  You feel your  heart racing or skipping beats.  There is a hole or tear in the PICC.  You have swelling in the arm in which the PICC was inserted.  You have a red streak going up your arm from where the PICC was inserted. Summary  A peripherally inserted central catheter (PICC) is a long, thin, flexible tube (catheter) that is inserted into a vein in the upper arm.  The PICC is inserted using a sterile technique by a specially trained nurse or physician. Only a trained clinical professional should remove it.  Keep your PICC identification card with you at all times.  Avoid blood pressure checks on the arm in which the PICC is placed.  If cared for  properly, a PICC can remain in place for several months. Having a PICC can also allow a person to go home from the hospital sooner. This information is not intended to replace advice given to you by your health care provider. Make sure you discuss any questions you have with your health care provider. Document Revised: 06/26/2019 Document Reviewed: 06/26/2019 Elsevier Patient Education  2021 [place].   Cyanocobalamin, Vitamin B12 injection What is this medicine? CYANOCOBALAMIN (sye an oh koe BAL a min) is a man made form of vitamin B12. Vitamin B12 is used in the growth of healthy blood cells, nerve cells, and proteins in the body. It also helps with the metabolism of fats and carbohydrates. This medicine is used to treat people who can not absorb vitamin B12. This medicine may be used for other purposes; ask your health care provider or pharmacist if you have questions. COMMON BRAND NAME(S): B-12 Compliance Kit, B-12 Injection Kit, Cyomin, LA-12, Nutri-Twelve, Physicians EZ Use B-12, Primabalt What should I tell my health care provider before I take this medicine? They need to know if you have any of these conditions:  kidney disease  Leber's disease  megaloblastic anemia  an unusual or allergic reaction to cyanocobalamin, cobalt, other  medicines, foods, dyes, or preservatives  pregnant or trying to get pregnant  breast-feeding How should I use this medicine? This medicine is injected into a muscle or deeply under the skin. It is usually given by a health care professional in a clinic or doctor's office. However, your doctor may teach you how to inject yourself. Follow all instructions. Talk to your pediatrician regarding the use of this medicine in children. Special care may be needed. Overdosage: If you think you have taken too much of this medicine contact a poison control center or emergency room at once. NOTE: This medicine is only for you. Do not share this medicine with others. What if I miss a dose? If you are given your dose at a clinic or doctor's office, call to reschedule your appointment. If you give your own injections and you miss a dose, take it as soon as you can. If it is almost time for your next dose, take only that dose. Do not take double or extra doses. What may interact with this medicine?  colchicine  heavy alcohol intake This list may not describe all possible interactions. Give your health care provider a list of all the medicines, herbs, non-prescription drugs, or dietary supplements you use. Also tell them if you smoke, drink alcohol, or use illegal drugs. Some items may interact with your medicine. What should I watch for while using this medicine? Visit your doctor or health care professional regularly. You may need blood work done while you are taking this medicine. You may need to follow a special diet. Talk to your doctor. Limit your alcohol intake and avoid smoking to get the best benefit. What side effects may I notice from receiving this medicine? Side effects that you should report to your doctor or health care professional as soon as possible:  allergic reactions like skin rash, itching or hives, swelling of the face, lips, or tongue  blue tint to skin  chest tightness,  pain  difficulty breathing, wheezing  dizziness  red, swollen painful area on the leg Side effects that usually do not require medical attention (report to your doctor or health care professional if they continue or are bothersome):  diarrhea  headache This list may not describe all possible  side effects. Call your doctor for medical advice about side effects. You may report side effects to FDA at 1-800-FDA-1088. Where should I keep my medicine? Keep out of the reach of children. Store at room temperature between 15 and 30 degrees C (59 and 85 degrees F). Protect from light. Throw away any unused medicine after the expiration date. NOTE: This sheet is a summary. It may not cover all possible information. If you have questions about this medicine, talk to your doctor, pharmacist, or health care provider.  2021 Elsevier/Gold Standard (2007-05-28 22:10:20)

## 2020-05-16 LAB — THYROID PANEL WITH TSH
Free Thyroxine Index: 2.4 (ref 1.2–4.9)
T3 Uptake Ratio: 29 % (ref 24–39)
T4, Total: 8.3 ug/dL (ref 4.5–12.0)
TSH: 4.64 u[IU]/mL — ABNORMAL HIGH (ref 0.450–4.500)

## 2020-05-18 ENCOUNTER — Encounter: Payer: Self-pay | Admitting: General Practice

## 2020-05-18 ENCOUNTER — Telehealth: Payer: Self-pay | Admitting: Oncology

## 2020-05-18 NOTE — Telephone Encounter (Signed)
Scheduled appts per 3/18 los. Called pt, no answer. Left vm with appts date and times.

## 2020-05-18 NOTE — Progress Notes (Signed)
[place] Note  Lakaya Tolen on her cell to offer support and encouragement as she has been through so much transition lately. Encouraged her to reach out anytime, particularly because she won't be on campus regularly like she had been. She sounded pleased to be remembered and cared for.   [place], [place], [facility] Pager [phone] Voicemail [phone]

## 2020-05-19 DIAGNOSIS — R112 Nausea with vomiting, unspecified: Secondary | ICD-10-CM | POA: Diagnosis not present

## 2020-05-19 DIAGNOSIS — D649 Anemia, unspecified: Secondary | ICD-10-CM | POA: Diagnosis not present

## 2020-05-19 DIAGNOSIS — M6281 Muscle weakness (generalized): Secondary | ICD-10-CM | POA: Diagnosis not present

## 2020-05-19 DIAGNOSIS — L8990 Pressure ulcer of unspecified site, unspecified stage: Secondary | ICD-10-CM | POA: Diagnosis not present

## 2020-05-19 DIAGNOSIS — N390 Urinary tract infection, site not specified: Secondary | ICD-10-CM | POA: Diagnosis not present

## 2020-05-19 DIAGNOSIS — R339 Retention of urine, unspecified: Secondary | ICD-10-CM | POA: Diagnosis not present

## 2020-05-19 DIAGNOSIS — C50919 Malignant neoplasm of unspecified site of unspecified female breast: Secondary | ICD-10-CM | POA: Diagnosis not present

## 2020-05-19 DIAGNOSIS — D519 Vitamin B12 deficiency anemia, unspecified: Secondary | ICD-10-CM | POA: Diagnosis not present

## 2020-05-19 DIAGNOSIS — R600 Localized edema: Secondary | ICD-10-CM | POA: Diagnosis not present

## 2020-05-19 LAB — VITAMIN B1: Vitamin B1 (Thiamine): <20 nmol/L — ABNORMAL LOW (ref 66.5–200.0)

## 2020-05-19 NOTE — Telephone Encounter (Signed)
Patient has been approved for Ibrance at no cost through [name] 05/18/20-02/27/21.  Advertising copywriter.  [place] [facility] [place] Phone [phone] Fax [phone] 05/19/2020 8:43 AM

## 2020-05-20 ENCOUNTER — Encounter (HOSPITAL_COMMUNITY): Payer: Self-pay | Admitting: Family Medicine

## 2020-05-20 ENCOUNTER — Other Ambulatory Visit: Payer: Self-pay | Admitting: Pharmacist

## 2020-05-20 ENCOUNTER — Other Ambulatory Visit: Payer: Self-pay

## 2020-05-20 ENCOUNTER — Inpatient Hospital Stay (HOSPITAL_COMMUNITY)
Admission: EM | Admit: 2020-05-20 | Discharge: 2020-05-31 | DRG: 641 | Disposition: A | Discharge status: Skilled Nursing Facility | Payer: Medicare PPO | Source: Skilled Nursing Facility | Attending: Internal Medicine | Admitting: Internal Medicine

## 2020-05-20 ENCOUNTER — Non-Acute Institutional Stay: Payer: Medicare PPO | Admitting: Nurse Practitioner

## 2020-05-20 ENCOUNTER — Other Ambulatory Visit: Payer: Self-pay | Admitting: Oncology

## 2020-05-20 DIAGNOSIS — Z803 Family history of malignant neoplasm of breast: Secondary | ICD-10-CM | POA: Diagnosis not present

## 2020-05-20 DIAGNOSIS — R1310 Dysphagia, unspecified: Secondary | ICD-10-CM | POA: Diagnosis present

## 2020-05-20 DIAGNOSIS — C50912 Malignant neoplasm of unspecified site of left female breast: Secondary | ICD-10-CM | POA: Diagnosis present

## 2020-05-20 DIAGNOSIS — Z8042 Family history of malignant neoplasm of prostate: Secondary | ICD-10-CM

## 2020-05-20 DIAGNOSIS — Z170 Estrogen receptor positive status [ER+]: Secondary | ICD-10-CM | POA: Diagnosis not present

## 2020-05-20 DIAGNOSIS — Z833 Family history of diabetes mellitus: Secondary | ICD-10-CM | POA: Diagnosis not present

## 2020-05-20 DIAGNOSIS — Z20822 Contact with and (suspected) exposure to covid-19: Secondary | ICD-10-CM | POA: Diagnosis present

## 2020-05-20 DIAGNOSIS — R0902 Hypoxemia: Secondary | ICD-10-CM | POA: Diagnosis not present

## 2020-05-20 DIAGNOSIS — Z8049 Family history of malignant neoplasm of other genital organs: Secondary | ICD-10-CM | POA: Diagnosis not present

## 2020-05-20 DIAGNOSIS — E876 Hypokalemia: Secondary | ICD-10-CM | POA: Diagnosis present

## 2020-05-20 DIAGNOSIS — C7932 Secondary malignant neoplasm of cerebral meninges: Secondary | ICD-10-CM | POA: Diagnosis not present

## 2020-05-20 DIAGNOSIS — Z808 Family history of malignant neoplasm of other organs or systems: Secondary | ICD-10-CM | POA: Diagnosis not present

## 2020-05-20 DIAGNOSIS — Z515 Encounter for palliative care: Secondary | ICD-10-CM | POA: Diagnosis not present

## 2020-05-20 DIAGNOSIS — E519 Thiamine deficiency, unspecified: Secondary | ICD-10-CM | POA: Diagnosis present

## 2020-05-20 DIAGNOSIS — Z66 Do not resuscitate: Secondary | ICD-10-CM | POA: Diagnosis not present

## 2020-05-20 DIAGNOSIS — F29 Unspecified psychosis not due to a substance or known physiological condition: Secondary | ICD-10-CM | POA: Diagnosis not present

## 2020-05-20 DIAGNOSIS — C7989 Secondary malignant neoplasm of other specified sites: Secondary | ICD-10-CM | POA: Diagnosis not present

## 2020-05-20 DIAGNOSIS — C7802 Secondary malignant neoplasm of left lung: Secondary | ICD-10-CM | POA: Diagnosis not present

## 2020-05-20 DIAGNOSIS — Z8249 Family history of ischemic heart disease and other diseases of the circulatory system: Secondary | ICD-10-CM | POA: Diagnosis not present

## 2020-05-20 DIAGNOSIS — Z9221 Personal history of antineoplastic chemotherapy: Secondary | ICD-10-CM

## 2020-05-20 DIAGNOSIS — M2550 Pain in unspecified joint: Secondary | ICD-10-CM | POA: Diagnosis not present

## 2020-05-20 DIAGNOSIS — Z8261 Family history of arthritis: Secondary | ICD-10-CM | POA: Diagnosis not present

## 2020-05-20 DIAGNOSIS — E440 Moderate protein-calorie malnutrition: Secondary | ICD-10-CM | POA: Diagnosis present

## 2020-05-20 DIAGNOSIS — I10 Essential (primary) hypertension: Secondary | ICD-10-CM | POA: Diagnosis present

## 2020-05-20 DIAGNOSIS — C787 Secondary malignant neoplasm of liver and intrahepatic bile duct: Secondary | ICD-10-CM | POA: Diagnosis present

## 2020-05-20 DIAGNOSIS — A1813 Tuberculosis of other urinary organs: Secondary | ICD-10-CM | POA: Diagnosis not present

## 2020-05-20 DIAGNOSIS — Z7189 Other specified counseling: Secondary | ICD-10-CM | POA: Diagnosis not present

## 2020-05-20 DIAGNOSIS — Z7401 Bed confinement status: Secondary | ICD-10-CM | POA: Diagnosis not present

## 2020-05-20 DIAGNOSIS — Z79811 Long term (current) use of aromatase inhibitors: Secondary | ICD-10-CM

## 2020-05-20 DIAGNOSIS — Z87442 Personal history of urinary calculi: Secondary | ICD-10-CM

## 2020-05-20 DIAGNOSIS — C50919 Malignant neoplasm of unspecified site of unspecified female breast: Secondary | ICD-10-CM | POA: Diagnosis not present

## 2020-05-20 DIAGNOSIS — C7951 Secondary malignant neoplasm of bone: Secondary | ICD-10-CM | POA: Diagnosis present

## 2020-05-20 DIAGNOSIS — R5381 Other malaise: Secondary | ICD-10-CM | POA: Diagnosis not present

## 2020-05-20 DIAGNOSIS — Z85820 Personal history of malignant melanoma of skin: Secondary | ICD-10-CM

## 2020-05-20 DIAGNOSIS — L89154 Pressure ulcer of sacral region, stage 4: Secondary | ICD-10-CM | POA: Diagnosis not present

## 2020-05-20 DIAGNOSIS — R531 Weakness: Secondary | ICD-10-CM

## 2020-05-20 DIAGNOSIS — D638 Anemia in other chronic diseases classified elsewhere: Secondary | ICD-10-CM | POA: Diagnosis not present

## 2020-05-20 DIAGNOSIS — C773 Secondary and unspecified malignant neoplasm of axilla and upper limb lymph nodes: Secondary | ICD-10-CM | POA: Diagnosis not present

## 2020-05-20 DIAGNOSIS — Z800 Family history of malignant neoplasm of digestive organs: Secondary | ICD-10-CM

## 2020-05-20 DIAGNOSIS — C799 Secondary malignant neoplasm of unspecified site: Secondary | ICD-10-CM | POA: Diagnosis present

## 2020-05-20 DIAGNOSIS — R609 Edema, unspecified: Secondary | ICD-10-CM | POA: Diagnosis not present

## 2020-05-20 DIAGNOSIS — L89324 Pressure ulcer of left buttock, stage 4: Secondary | ICD-10-CM | POA: Diagnosis not present

## 2020-05-20 DIAGNOSIS — R339 Retention of urine, unspecified: Secondary | ICD-10-CM | POA: Diagnosis not present

## 2020-05-20 DIAGNOSIS — Z923 Personal history of irradiation: Secondary | ICD-10-CM

## 2020-05-20 DIAGNOSIS — Z6826 Body mass index (BMI) 26.0-26.9, adult: Secondary | ICD-10-CM

## 2020-05-20 LAB — CBC WITH DIFFERENTIAL/PLATELET
Abs Immature Granulocytes: 0.08 10*3/uL — ABNORMAL HIGH (ref 0.00–0.07)
Basophils Absolute: 0 10*3/uL (ref 0.0–0.1)
Basophils Relative: 0 %
Eosinophils Absolute: 0 10*3/uL (ref 0.0–0.5)
Eosinophils Relative: 0 %
HCT: 27.3 % — ABNORMAL LOW (ref 36.0–46.0)
Hemoglobin: 8.7 g/dL — ABNORMAL LOW (ref 12.0–15.0)
Immature Granulocytes: 1 %
Lymphocytes Relative: 8 %
Lymphs Abs: 0.5 10*3/uL — ABNORMAL LOW (ref 0.7–4.0)
MCH: 30.6 pg (ref 26.0–34.0)
MCHC: 31.9 g/dL (ref 30.0–36.0)
MCV: 96.1 fL (ref 80.0–100.0)
Monocytes Absolute: 0.3 10*3/uL (ref 0.1–1.0)
Monocytes Relative: 5 %
Neutro Abs: 5.4 10*3/uL (ref 1.7–7.7)
Neutrophils Relative %: 86 %
Platelets: 197 10*3/uL (ref 150–400)
RBC: 2.84 MIL/uL — ABNORMAL LOW (ref 3.87–5.11)
RDW: 26.5 % — ABNORMAL HIGH (ref 11.5–15.5)
WBC: 6.3 10*3/uL (ref 4.0–10.5)
nRBC: 0.5 % — ABNORMAL HIGH (ref 0.0–0.2)

## 2020-05-20 LAB — BASIC METABOLIC PANEL
Anion gap: 13 (ref 5–15)
BUN: 7 mg/dL — ABNORMAL LOW (ref 8–23)
CO2: 27 mmol/L (ref 22–32)
Calcium: 8 mg/dL — ABNORMAL LOW (ref 8.9–10.3)
Chloride: 100 mmol/L (ref 98–111)
Creatinine, Ser: 0.53 mg/dL (ref 0.44–1.00)
GFR, Estimated: >60 mL/min (ref >60)
Glucose, Bld: 82 mg/dL (ref 70–99)
Potassium: 2.7 mmol/L — CL (ref 3.5–5.1)
Sodium: 140 mmol/L (ref 135–145)

## 2020-05-20 LAB — MAGNESIUM: Magnesium: 1.9 mg/dL (ref 1.7–2.4)

## 2020-05-20 MED ORDER — THIAMINE HCL 100 MG/ML IJ SOLN
500.0000 mg | Freq: Three times a day (TID) | INTRAMUSCULAR | Status: DC
  Administered 2020-05-20 – 2020-05-22 (×4): 500 mg via INTRAVENOUS
  Filled 2020-05-20 (×5): qty 6

## 2020-05-20 MED ORDER — POTASSIUM CHLORIDE 10 MEQ/100ML IV SOLN
10.0000 meq | Freq: Once | INTRAVENOUS | Status: DC
  Filled 2020-05-20: qty 100

## 2020-05-20 MED ORDER — POTASSIUM CHLORIDE CRYS ER 20 MEQ PO TBCR
40.0000 meq | EXTENDED_RELEASE_TABLET | Freq: Once | ORAL | Status: DC
  Filled 2020-05-20: qty 2

## 2020-05-20 NOTE — Progress Notes (Signed)
[facility] Palliative Care Consult Note Telephone: [phone]  Fax: [phone]  PATIENT NAME: Brooke [name] [address] [place] [address] [id number] (home)  DOB: October 15, 1950 MRN: [id number]  PRIMARY CARE PROVIDER:    [name], MD,  [place] [address] [phone]  REFERRING PROVIDER:   Dr. Wenda Low (facility provider)  RESPONSIBLE PARTY:   Extended Emergency Contact Information Primary Emergency Contact: Stefan Church, MD John[place]te Litter of [place] Phone: [phone] Mobile Phone: [phone] Relation: Sister Secondary Emergency Contact: Lajoyce Lauber Home Phone: [phone] Relation: Friend  I met face to face with patient in facility.   ASSESSMENT AND RECOMMENDATIONS:   Advance Care Planning: Today's visit consisted of building trust and discussions on Palliative care medicine as a specialized medical care for people living with serious illness, aimed at facilitating improved quality of life through symptoms relief, assisting with advance care planning and establishing goals of care. Family expressed appreciation for education provided on Palliative care and how it differs from Hospice service. Palliative care will continue to provide support to patient, family and the medical team. Goal of care: Patient's goal of care is function. Patient verbalized desire to get stronger so she can return to her home. Directives: Patient has a signed MOST and DNR forms on file in the facility, copy on [facility] EMR. Details of MOST form dated 05/13/2020 include, limited additional intervention, antibiotics if indicated, IV fluids if indicated, no feeding tube. Patient reiterated desire to not be resuscitated in the event of cardiac or respiratory arrest.  Symptom Management:  Weakness: weakness related to poor oral intake and deconditioning. Patient  non-ambulatory at this time, working with PT to improve strengthening and balance. Therapy report poor progression, report patient requires moderate to maximum assist with transfers, not walked since hospital discharge, unable to bear weight on legs. Continue to encourage participation. Maintain safety, prevent falls. Protein calorie malnutrition: secondary to poor oral intake. Patient with <25% meal intake. Patient report intolerance to nutritional supplement, makes her nauseous.  Patient on Marinol, continue to offer meals from a variety of food groups. , encourage snacking between meals. Nausea controlled on as needed Zofran and compazine. Urinary retention: discharged with foley cath, failed voiding trial 2 days ago. No report of fever, or chills. Consider referral to urology if no improvement in condition.  Follow up Palliative Care Visit: Palliative care will continue to follow for complex decision making and symptom management. Return in about 4-6 weeks or prn.  Family /Caregiver/Community Supports: Patient is single, has no biological children. She verbalized having supportive family and friends.  Cognitive / Functional decline: Patient awake, alert and coherent. Currently dependent on staff for all of her ADLs, able to feed self, non ambulatory. Incontinent of bowel and bladder. No report of recent falls.  I spent 50 minutes providing this consultation, time includes time spent with patient, chart review, provider coordination, and documentation. More than 50% of the time in this consultation was spent counseling and coordinating communication.   CHIEF COMPLAINT: Weakness  History obtained from review of EMR, discussion with facility staff and interview with patient. Records reviewed and summarized bellow.  HISTORY OF PRESENT ILLNESS:  Brooke [name] is a 70 y.o. year old female with multiple medical problems including metastatic breast cancer, HTN, anxiety, anemia. Patient with metastatic  breast cancer with mets to brain, lungs, liver, patient on Ibrance. Patient  is s/p hospitalization from 05/02/2020 to 05/12/2020 for intractable nausea and generalized weakness, she was discharged on supplemental oxygen, currently on 3L via nasal cannula. She was found to be anemic (hgb 7.4) was transfused 1 unit PRBC. Patient with poor progress with rehab stay due to ongoing weakness and deconditioning. Palliative care was asked to help address advance care planning and goals of care. This is an initial visit.  CODE STATUS: DNR  PPS: 30%  HOSPICE ELIGIBILITY/DIAGNOSIS: TBD  ROS Constitutional: denies fever, denies chills, endorsed fatigue EYES: denies acute vision changes ENMT: denies dysphagia Cardiovascular: denies chest pain, denies palpitation Pulmonary: denies cough, denies increased SOB Abdomen: endorses poor appetite, endorses constipation, endorses incontinence of bowel GU: endorsed urinary retention, foley cath present MSK:  endorses ROM limitations, no falls reported Skin: endorsed wound on buttocks Neurological: endorses weakness, denies uncontrolled pain, denies insomnia Psych: Endorses positive mood Heme/lymph/immuno: denies bruises, abnormal bleeding   Physical Exam: Current and past weights: 181lbs per facility report, report loosing 50lbs in the last 4 months. General: frail appearing, cooperative, lying in bed in NAD EYES: anicteric sclera, lids intact, no discharge  ENMT: intact hearing, oral mucous membranes moist CV: +1 BLE edema, PICC line noted to right arm Pulmonary: no increased work of breathing, no cough, no audible wheezes, room air Abdomen: intake <25%,  no ascites GU: deferred MSK:  decreased ROM in all extremities, no contractures of LE, non ambulatory Skin: warm and dry, stage 2 pressure ulcer to bilateral buttock Neuro: Generalized weakness, A and O x 4 Psych: non-anxious affect today Hem/lymph/immuno: no widespread bruising   PAST MEDICAL HISTORY:   Past Medical History:  Diagnosis Date  . Anemia   . Anxiety   . Arthritis   . ASCUS of cervix with negative high risk HPV 12/2018  . Bilateral dry eyes   . Breast cancer, left breast ([facility])   . Family history of breast cancer 02/02/2017  . Family history of breast cancer   . Family history of colon cancer 02/02/2017  . Family history of colon cancer   . Family history of prostate cancer   . Family history of prostate cancer in father 02/02/2017  . Family history of uterine cancer   . Fibroid   . History of kidney stones   . Hyperlipidemia   . Hypertension   . Melanoma of upper arm ([facility]) 2009   RIGHT ARM   . Obesity (BMI 30.0-34.9)   . Osteopenia 09/2017   T score -1.9 max 8% / 0.8% statistically significant decline at right and left hip stable at the spine  . PONV (postoperative nausea and vomiting)   . Squamous carcinoma     of the skin  . UTI (urinary tract infection)   . Varicose veins of both lower extremities   . Vitamin D deficiency 07/2009   LOW VITAMIN D 29  . Vitamin deficiency 07/2008   VITAMIN D LOW 23    SOCIAL HX:  Social History   Tobacco Use  . Smoking status: Never Smoker  . Smokeless tobacco: Never Used  Substance Use Topics  . Alcohol use: No   FAMILY HX:  Family History  Problem Relation Age of Onset  . Diabetes Father   . Hypertension Father   . Heart disease Father   . Prostate cancer Father 34  . Arthritis Father   . Breast cancer Sister 79       again at age 87, GT results unk  . Skin cancer Sister   .  Colon cancer Paternal Grandfather 63  . Hypertension Mother   . Heart failure Mother   . Uterine cancer Sister 39       stage IV  . Breast cancer Cousin        mat first cousin dx under 24    ALLERGIES:  Allergies  Allergen Reactions  . Neosporin [Neomycin-Bacitracin Zn-Polymyx] Swelling, Rash and Other (See Comments)    Blisters  . Bactrim [Sulfamethoxazole-Trimethoprim]     Made her feel very sick  . Benzalkonium Chloride Other  (See Comments)    Redness (Pt is unaware)  It works by killing microorganisms and inhibiting their future growth, and for this reason frequently appears as an ingredient in antibacterial hand wipes, antiseptic creams and anti-itch ointments.  . Latex Rash     PERTINENT MEDICATIONS:  Outpatient Encounter Medications as of 05/20/2020  Medication Sig  . anastrozole (ARIMIDEX) 1 MG tablet Take 1 tablet (1 mg total) by mouth daily.  Marland Kitchen dronabinol (MARINOL) 2.5 MG capsule Take 1 capsule (2.5 mg total) by mouth daily before lunch.  . furosemide (LASIX) 20 MG tablet Take 1 tablet (20 mg total) by mouth every other day.  . hydrocortisone cream 1 % Apply topically 2 (two) times daily.  Marland Kitchen losartan (COZAAR) 25 MG tablet Take 0.5 tablets (12.5 mg total) by mouth daily.  . metoCLOPramide (REGLAN) 10 MG tablet Take 1 tablet (10 mg total) by mouth every 8 (eight) hours as needed for nausea.  . ondansetron (ZOFRAN-ODT) 4 MG disintegrating tablet Take 1 tablet (4 mg total) by mouth every 8 (eight) hours as needed for nausea or vomiting.  . palbociclib (IBRANCE) 75 MG tablet Take 1 tablet (75 mg total) by mouth daily. Take for 21 days on, 7 days off, repeat every 28 days.  . potassium chloride (KLOR-CON) 10 MEQ tablet Take 40 mEq by mouth daily.  . prochlorperazine (COMPAZINE) 10 MG tablet Take 1 tablet (10 mg total) by mouth every 6 (six) hours as needed (Nausea or vomiting).  . tamsulosin (FLOMAX) 0.4 MG CAPS capsule Take 1 capsule (0.4 mg total) by mouth daily.  . vitamin B-12 (CYANOCOBALAMIN) 1000 MCG tablet Take 1 tablet (1,000 mcg total) by mouth daily.   No facility-administered encounter medications on file as of 05/20/2020.    Thank you for the opportunity to participate in the care of [name]. The palliative care team will continue to follow. Please call our office at [phone] if we can be of additional assistance.  Jari Favre, DNP, AGPCNP-BC

## 2020-05-20 NOTE — ED Provider Notes (Signed)
[facility] DEPT Provider Note   CSN: [id number] Arrival date & time: 05/20/20  1911     History Chief Complaint  Patient presents with  . Abnormal Lab    Brooke [name] is a 70 y.o. female.  HPI   Patient has a history of metastatic breast cancer.  She has been receiving chemotherapy and is followed by Dr. [name].  Patient has had progressive weakness is currently residing at a nursing facility.  Patient had outpatient laboratory tests on March 13.  Patient's results returned yesterday and she was noted to have extremely low vitamin B1 levels.  They are undetectable.  Patient's B12 level is also decreased.  Patient was called with the result and instructed to come to the ED because she needs IV replacement therapy.  Dr. [name] called the ED and stated the patient needs 500 mg of IV thiamine 3 times daily for the next 3 days.  Patient denies any other acute issues.  She is not having any nausea or vomiting at this time.  No fevers or chills.  No chest pain.  No difficulty breathing.  Past Medical History:  Diagnosis Date  . Anemia   . Anxiety   . Arthritis   . ASCUS of cervix with negative high risk HPV 12/2018  . Bilateral dry eyes   . Breast cancer, left breast ([place])   . Family history of breast cancer 02/02/2017  . Family history of breast cancer   . Family history of colon cancer 02/02/2017  . Family history of colon cancer   . Family history of prostate cancer   . Family history of prostate cancer in father 02/02/2017  . Family history of uterine cancer   . Fibroid   . History of kidney stones   . Hyperlipidemia   . Hypertension   . Melanoma of upper arm ([place]) 2009   RIGHT ARM   . Obesity (BMI 30.0-34.9)   . Osteopenia 09/2017   T score -1.9 max 8% / 0.8% statistically significant decline at right and left hip stable at the spine  . PONV (postoperative nausea and vomiting)   . Squamous carcinoma     of the skin  . UTI (urinary tract  infection)   . Varicose veins of both lower extremities   . Vitamin D deficiency 07/2009   LOW VITAMIN D 29  . Vitamin deficiency 07/2008   VITAMIN D LOW 23    Patient Active Problem List   Diagnosis Date Noted  . B12 deficiency anemia 05/11/2020  . Pressure injury of skin 05/08/2020  . Malnutrition of moderate degree 05/05/2020  . Intractable vomiting with nausea 05/03/2020  . Acute lower UTI 04/21/2020  . Dehydration 04/21/2020  . Depression with anxiety 04/21/2020  . Transaminitis 04/21/2020  . Metastatic malignant neoplasm ([place])   . Nausea vomiting and diarrhea   . Intractable nausea and vomiting 04/20/2020  . PICC (peripherally inserted central catheter) in place 04/17/2020  . Bone metastases ([place]) 03/11/2020  . Liver metastases ([place]) 03/05/2020  . Lung metastases ([place]) 03/05/2020  . Postoperative state 04/23/2019  . Superficial basal cell carcinoma (BCC) 02/10/2019  . Goals of care, counseling/discussion 12/14/2017  . Family history of uterine cancer   . Family history of prostate cancer   . Melanoma in situ of right upper extremity ([place]) 07/18/2017  . Port-A-Cath in place 04/18/2017  . Drug-induced neutropenia ([place]) 03/09/2017  . Family history of breast cancer 02/02/2017  . Family history of colon cancer 02/02/2017  .  Family history of prostate cancer in father 02/02/2017  . Malignant neoplasm of overlapping sites of left breast in female, estrogen receptor positive ([place]) 01/31/2017  . Hx of hot flashes, menopausal, HRT in the past 12/19/2011  . Osteopenia, stable, DEXA 2013, recs to repeat in 2018 12/19/2011  . Hx of Hyperlipidemia, LDL 144 in 07/2011 12/19/2011  . Vitamin D insufficiency 12/19/2011  . Fibroid     Past Surgical History:  Procedure Laterality Date  . ABDOMINAL SURGERY  1975   ovairan cyst   . APPENDECTOMY    . BREAST LUMPECTOMY WITH RADIOACTIVE SEED LOCALIZATION Right 09/25/2017   Procedure: RIGHT BREAST LUMPECTOMY WITH RADIOACTIVE SEED  LOCALIZATION;  Surgeon: Jovita Kussmaul, MD;  Location: [facility];  Service: General;  Laterality: Right;  . BREAST SURGERY     bio  . CHOLECYSTECTOMY    . DERMOID CYST  EXCISION    . DILATATION & CURETTAGE/HYSTEROSCOPY WITH MYOSURE N/A 04/23/2019   Procedure: DILATATION & CURETTAGE/HYSTEROSCOPY WITH MYOSURE;  Surgeon: [name], MD;  Location: [facility];  Service: Gynecology;  Laterality: N/A;  request to follow at 9:30am in [facility] block requests 45 minutes  . DILATION AND CURETTAGE OF UTERUS  1998  . GALLBLADDER SURGERY    . HYSTEROSCOPY    . insertion  port a cath  03/02/2016  . MASTECTOMY MODIFIED RADICAL Left 09/25/2017   Procedure: LEFT MODIFIED RADICAL MASTECTOMY;  Surgeon: Jovita Kussmaul, MD;  Location: [facility];  Service: General;  Laterality: Left;  . PORTACATH PLACEMENT N/A 03/02/2017   Procedure: INSERTION PORT-A-CATH;  Surgeon: Jovita Kussmaul, MD;  Location: [facility];  Service: General;  Laterality: N/A;  . SKIN BIOPSY    . SKIN CANCER EXCISION  2010,2011   2010-RIGHT ARM, 2011-LEFT LOWER LEG.-DR Rolm Bookbinder DERM. DR. Sarajane Jews IS HER DERM SURGEON.     OB History    Gravida  0   Para  0   Term  0   Preterm  0   AB  0   Living  0     SAB  0   IAB  0   Ectopic  0   Multiple  0   Live Births              Family History  Problem Relation Age of Onset  . Diabetes Father   . Hypertension Father   . Heart disease Father   . Prostate cancer Father 23  . Arthritis Father   . Breast cancer Sister 23       again at age 31, GT results unk  . Skin cancer Sister   . Colon cancer Paternal Grandfather 85  . Hypertension Mother   . Heart failure Mother   . Uterine cancer Sister 8       stage IV  . Breast cancer Cousin        mat first cousin dx under 38    Social History   Tobacco Use  . Smoking status: Never Smoker  . Smokeless tobacco: Never Used  Vaping Use  . Vaping Use: Never used  Substance Use Topics  . Alcohol use: No   . Drug use: No    Home Medications Prior to Admission medications   Medication Sig Start Date End Date Taking? Authorizing Provider  anastrozole (ARIMIDEX) 1 MG tablet Take 1 tablet (1 mg total) by mouth daily. 05/12/20   Antonieta Pert, MD  dronabinol (MARINOL) 2.5 MG capsule Take 1 capsule (2.5 mg total)  by mouth daily before lunch. 05/11/20   Antonieta Pert, MD  furosemide (LASIX) 20 MG tablet Take 1 tablet (20 mg total) by mouth every other day. 05/12/20   Antonieta Pert, MD  hydrocortisone cream 1 % Apply topically 2 (two) times daily. 05/12/20   Antonieta Pert, MD  losartan (COZAAR) 25 MG tablet Take 0.5 tablets (12.5 mg total) by mouth daily. 04/24/20   Nita Sells, MD  metoCLOPramide (REGLAN) 10 MG tablet Take 1 tablet (10 mg total) by mouth every 8 (eight) hours as needed for nausea. 04/24/20   Nita Sells, MD  ondansetron (ZOFRAN-ODT) 4 MG disintegrating tablet Take 1 tablet (4 mg total) by mouth every 8 (eight) hours as needed for nausea or vomiting. 04/06/20   Magrinat, Virgie Dad, MD  palbociclib Leslee Home) 75 MG tablet Take 1 tablet (75 mg total) by mouth daily. Take for 21 days on, 7 days off, repeat every 28 days. 05/13/20   Magrinat, Virgie Dad, MD  potassium chloride (KLOR-CON) 10 MEQ tablet Take 40 mEq by mouth daily. 04/24/20   [provider]  prochlorperazine (COMPAZINE) 10 MG tablet Take 1 tablet (10 mg total) by mouth every 6 (six) hours as needed (Nausea or vomiting). 03/24/20   Magrinat, Virgie Dad, MD  tamsulosin (FLOMAX) 0.4 MG CAPS capsule Take 1 capsule (0.4 mg total) by mouth daily. 05/11/20   Antonieta Pert, MD  vitamin B-12 (CYANOCOBALAMIN) 1000 MCG tablet Take 1 tablet (1,000 mcg total) by mouth daily. 05/11/20   Antonieta Pert, MD    Allergies    Neosporin [neomycin-bacitracin zn-polymyx], Bactrim [sulfamethoxazole-trimethoprim], Benzalkonium chloride, and Latex  Review of Systems   Review of Systems  All other systems reviewed and are negative.   Physical  Exam Updated Vital Signs BP 140/63   Pulse 91   Temp 98 F (36.7 C) (Oral)   Resp 18   Ht 1.753 m (5\' 9" )   Wt 82.6 kg   LMP 03/18/1997   SpO2 95%   BMI 26.89 kg/m   Physical Exam Vitals and nursing note reviewed.  Constitutional:      Appearance: She is well-developed. She is not toxic-appearing or diaphoretic.  HENT:     Head: Normocephalic and atraumatic.     Right Ear: External ear normal.     Left Ear: External ear normal.  Eyes:     General: No scleral icterus.       Right eye: No discharge.        Left eye: No discharge.     Conjunctiva/sclera: Conjunctivae normal.  Neck:     Trachea: No tracheal deviation.  Cardiovascular:     Rate and Rhythm: Normal rate and regular rhythm.  Pulmonary:     Effort: Pulmonary effort is normal. No respiratory distress.     Breath sounds: Normal breath sounds. No stridor. No wheezing or rales.  Abdominal:     General: Bowel sounds are normal. There is no distension.     Palpations: Abdomen is soft.     Tenderness: There is no abdominal tenderness. There is no guarding or rebound.  Musculoskeletal:        General: No tenderness.     Cervical back: Neck supple.  Skin:    General: Skin is warm and dry.     Findings: No rash.  Neurological:     General: No focal deficit present.     Mental Status: She is alert.     Cranial Nerves: No cranial nerve deficit (no facial droop, extraocular movements  intact, no slurred speech).     Sensory: No sensory deficit.     Motor: No abnormal muscle tone or seizure activity.     Coordination: Coordination normal.     Comments: Generalized weakness     ED Results / Procedures / Treatments   Labs (all labs ordered are listed, but only abnormal results are displayed) Labs Reviewed  CBC WITH DIFFERENTIAL/PLATELET - Abnormal; Notable for the following components:      Result Value   RBC 2.84 (*)    Hemoglobin 8.7 (*)    HCT 27.3 (*)    RDW 26.5 (*)    nRBC 0.5 (*)    Lymphs Abs 0.5 (*)     Abs Immature Granulocytes 0.08 (*)    All other components within normal limits  BASIC METABOLIC PANEL - Abnormal; Notable for the following components:   Potassium 2.7 (*)    BUN 7 (*)    Calcium 8.0 (*)    All other components within normal limits  MAGNESIUM    EKG None  Radiology No results found.  Procedures Procedures   Medications Ordered in ED Medications  thiamine (B-1) injection 500 mg (500 mg Intravenous Given 05/20/20 2002)  potassium chloride 10 mEq in 100 mL IVPB (has no administration in time range)  potassium chloride SA (KLOR-CON) CR tablet 40 mEq (has no administration in time range)    ED Course  I have reviewed the triage vital signs and the nursing notes.  Pertinent labs & imaging results that were available during my care of the patient were reviewed by me and considered in my medical decision making (see chart for details).  Clinical Course as of 05/20/20 2159  Wed May 20, 2020  2103 CBC shows stable anemia. [JK]    Clinical Course User Index [JK] Dorie Rank, MD   MDM Rules/Calculators/A&P                          Patient presented to the ED for treatment of low thiamine levels.  Patient had outpatient laboratory tests.  She was called today and was told that her thiamine levels were extremely low and she need to be needed for IV treatment.  Patient otherwise has no acute complaints.  Laboratory tests are also notable for hypokalemia.  I have ordered potassium replacement as well as diarrhea.  Final Clinical Impression(s) / ED Diagnoses Final diagnoses:  Thiamine deficiency  Hypokalemia      Dorie Rank, MD 05/20/20 2200

## 2020-05-20 NOTE — ED Triage Notes (Signed)
Patient BIB EMS from [facility], patient has been there x1 week. Patient's PCP sent her here for low Thymine level. Patient has history of cancer; when patient is asked about cancer, patient states she has six different kinds of cancer that is widespread. Patient has PICC line in right upper arm. Patient is AxOx4. Patient wears 2LPM O2 at baseline.

## 2020-05-20 NOTE — H&P (Signed)
History and Physical    Brooke [name]:[id number] DOB: 08-31-1950 DOA: 05/20/2020  PCP: [name], MD   Patient coming from:   SNF  Chief Complaint: sent for admission for abnormal labs  HPI: Brooke [name] is a 70 y.o. female with medical history significant for HTN, metastatic breast cancer.  She has been receiving chemotherapy and is followed by Dr. [name].  Patient has had progressive weakness is currently residing at a nursing facility.  Patient had outpatient laboratory tests on March 13 and results reported yesterday and she was noted to have undectable vitamin B1 levels.  Patient's B12 level is also decreased.  Patient was called with the result and instructed to come to the ED because she needs IV replacement therapy.  Dr. [name] called the ED and stated the patient needs 500 mg of IV thiamine 3 times daily for the next 3 days. She was also reportedly seen by neurology recently for intermittent confusion and neurology recommended the IV thiamine replacement protocol. Patient denies any other acute issues. She is not having any nausea or vomiting at this time.  No fevers or chills.  No chest pain.  No difficulty breathing.  Review of Systems:  General: Reports generalized weakness. Denies fever, chills, weight loss, night sweats.  Denies dizziness. HENT: Denies head trauma, headache, denies change in hearing, tinnitus.  Denies nasal bleeding.  Denies sore throat, sores in mouth.  Denies difficulty swallowing Eyes: Denies blurry vision, pain in eye, drainage.  Denies discoloration of eyes. Neck: Denies pain.  Denies swelling.  Denies pain with movement. Cardiovascular: Denies chest pain, palpitations.  Denies edema.  Denies orthopnea Respiratory: Denies shortness of breath, cough.  Denies wheezing.  Denies sputum production Gastrointestinal: Denies abdominal pain, swelling. Denies nausea, vomiting, diarrhea. Denies melena. Denies hematemesis. Musculoskeletal: Denies  limitation of movement.  Denies deformity or swelling.  Denies pain.   Genitourinary: Denies pelvic pain.  Denies urinary frequency or hesitancy.  Denies dysuria.  Skin: Denies rash.  Denies petechiae, purpura, ecchymosis. Neurological: Denies headache.  Denies syncope.  Denies seizure activity.  Denies weakness or paresthesia.  Denies slurred speech, drooping face.  Denies visual change. Psychiatric: Denies depression, anxiety. Denies hallucinations.  Past Medical History:  Diagnosis Date  . Anemia   . Anxiety   . Arthritis   . ASCUS of cervix with negative high risk HPV 12/2018  . Bilateral dry eyes   . Breast cancer, left breast ([place])   . Family history of breast cancer 02/02/2017  . Family history of breast cancer   . Family history of colon cancer 02/02/2017  . Family history of colon cancer   . Family history of prostate cancer   . Family history of prostate cancer in father 02/02/2017  . Family history of uterine cancer   . Fibroid   . History of kidney stones   . Hyperlipidemia   . Hypertension   . Melanoma of upper arm ([place]) 2009   RIGHT ARM   . Obesity (BMI 30.0-34.9)   . Osteopenia 09/2017   T score -1.9 max 8% / 0.8% statistically significant decline at right and left hip stable at the spine  . PONV (postoperative nausea and vomiting)   . Squamous carcinoma     of the skin  . UTI (urinary tract infection)   . Varicose veins of both lower extremities   . Vitamin D deficiency 07/2009   LOW VITAMIN D 29  . Vitamin deficiency 07/2008   VITAMIN D LOW  23    Past Surgical History:  Procedure Laterality Date  . ABDOMINAL SURGERY  1975   ovairan cyst   . APPENDECTOMY    . BREAST LUMPECTOMY WITH RADIOACTIVE SEED LOCALIZATION Right 09/25/2017   Procedure: RIGHT BREAST LUMPECTOMY WITH RADIOACTIVE SEED LOCALIZATION;  Surgeon: Jovita Kussmaul, MD;  Location: [facility];  Service: General;  Laterality: Right;  . BREAST SURGERY     bio  . CHOLECYSTECTOMY    . DERMOID CYST  EXCISION     . DILATATION & CURETTAGE/HYSTEROSCOPY WITH MYOSURE N/A 04/23/2019   Procedure: DILATATION & CURETTAGE/HYSTEROSCOPY WITH MYOSURE;  Surgeon: [name], MD;  Location: [facility];  Service: Gynecology;  Laterality: N/A;  request to follow at 9:30am in [facility] block requests 45 minutes  . DILATION AND CURETTAGE OF UTERUS  1998  . GALLBLADDER SURGERY    . HYSTEROSCOPY    . insertion  port a cath  03/02/2016  . MASTECTOMY MODIFIED RADICAL Left 09/25/2017   Procedure: LEFT MODIFIED RADICAL MASTECTOMY;  Surgeon: Jovita Kussmaul, MD;  Location: [place];  Service: General;  Laterality: Left;  . PORTACATH PLACEMENT N/A 03/02/2017   Procedure: INSERTION PORT-A-CATH;  Surgeon: Jovita Kussmaul, MD;  Location: [place];  Service: General;  Laterality: N/A;  . SKIN BIOPSY    . SKIN CANCER EXCISION  2010,2011   2010-RIGHT ARM, 2011-LEFT LOWER LEG.-DR Rolm Bookbinder DERM. DR. Sarajane Jews IS HER DERM SURGEON.    Social History  reports that she has never smoked. She has never used smokeless tobacco. She reports that she does not drink alcohol and does not use drugs.  Allergies  Allergen Reactions  . Neosporin [Neomycin-Bacitracin Zn-Polymyx] Swelling, Rash and Other (See Comments)    Blisters  . Bactrim [Sulfamethoxazole-Trimethoprim]     Made her feel very sick  . Benzalkonium Chloride Other (See Comments)    Redness (Pt is unaware)  It works by killing microorganisms and inhibiting their future growth, and for this reason frequently appears as an ingredient in antibacterial hand wipes, antiseptic creams and anti-itch ointments.  . Latex Rash    Family History  Problem Relation Age of Onset  . Diabetes Father   . Hypertension Father   . Heart disease Father   . Prostate cancer Father 49  . Arthritis Father   . Breast cancer Sister 65       again at age 24, GT results unk  . Skin cancer Sister   . Colon cancer Paternal Grandfather 41  . Hypertension Mother   . Heart failure  Mother   . Uterine cancer Sister 73       stage IV  . Breast cancer Cousin        mat first cousin dx under 55     Prior to Admission medications   Medication Sig Start Date End Date Taking? Authorizing Provider  anastrozole (ARIMIDEX) 1 MG tablet Take 1 tablet (1 mg total) by mouth daily. 05/12/20   Antonieta Pert, MD  dronabinol (MARINOL) 2.5 MG capsule Take 1 capsule (2.5 mg total) by mouth daily before lunch. 05/11/20   Antonieta Pert, MD  furosemide (LASIX) 20 MG tablet Take 1 tablet (20 mg total) by mouth every other day. 05/12/20   Antonieta Pert, MD  hydrocortisone cream 1 % Apply topically 2 (two) times daily. 05/12/20   Antonieta Pert, MD  losartan (COZAAR) 25 MG tablet Take 0.5 tablets (12.5 mg total) by mouth daily. 04/24/20   Nita Sells, MD  metoCLOPramide (REGLAN)  10 MG tablet Take 1 tablet (10 mg total) by mouth every 8 (eight) hours as needed for nausea. 04/24/20   Nita Sells, MD  ondansetron (ZOFRAN-ODT) 4 MG disintegrating tablet Take 1 tablet (4 mg total) by mouth every 8 (eight) hours as needed for nausea or vomiting. 04/06/20   Magrinat, Virgie Dad, MD  palbociclib Leslee Home) 75 MG tablet Take 1 tablet (75 mg total) by mouth daily. Take for 21 days on, 7 days off, repeat every 28 days. 05/13/20   Magrinat, Virgie Dad, MD  potassium chloride (KLOR-CON) 10 MEQ tablet Take 40 mEq by mouth daily. 04/24/20   [provider]  prochlorperazine (COMPAZINE) 10 MG tablet Take 1 tablet (10 mg total) by mouth every 6 (six) hours as needed (Nausea or vomiting). 03/24/20   Magrinat, Virgie Dad, MD  tamsulosin (FLOMAX) 0.4 MG CAPS capsule Take 1 capsule (0.4 mg total) by mouth daily. 05/11/20   Antonieta Pert, MD  vitamin B-12 (CYANOCOBALAMIN) 1000 MCG tablet Take 1 tablet (1,000 mcg total) by mouth daily. 05/11/20   Antonieta Pert, MD    Physical Exam: Vitals:   05/20/20 2000 05/20/20 2104 05/20/20 2130 05/20/20 2200  BP: (!) 117/94 (!) 129/59 140/63 140/60  Pulse: 93 88 91 88  Resp: 18 18 18 18    Temp:      TempSrc:      SpO2: 97% 97% 95% 96%  Weight:      Height:        Constitutional: NAD, calm, comfortable Vitals:   05/20/20 2000 05/20/20 2104 05/20/20 2130 05/20/20 2200  BP: (!) 117/94 (!) 129/59 140/63 140/60  Pulse: 93 88 91 88  Resp: 18 18 18 18   Temp:      TempSrc:      SpO2: 97% 97% 95% 96%  Weight:      Height:       General: WDWN, Alert and oriented x3.  Eyes: EOMI, PERRL, conjunctivae normal.  Sclera nonicteric HENT:  [place]/AT, external ears normal.  Nares patent without epistasis.  Mucous membranes are moist. Thinning hair Neck: Soft, normal range of motion, supple, no masses, no thyromegaly.  Trachea midline Respiratory: clear to auscultation bilaterally, no wheezing, no crackles. Normal respiratory effort. No accessory muscle use.  Cardiovascular: Regular rate and rhythm, no murmurs / rubs / gallops. No extremity edema.  Abdomen: Soft, no tenderness, nondistended, no rebound or guarding.  No masses palpated. Bowel sounds normoactive Musculoskeletal: FROM. no cyanosis. Normal muscle tone.  Skin: Warm, dry, intact no rashes, lesions, ulcers. No induration Neurologic: CN 2-12 grossly intact. Normal speech.  Sensation intact, Strength 4/5 in all extremities.   Psychiatric: Normal judgment and insight.  Normal mood.    Labs on Admission: I have personally reviewed following labs and imaging studies  CBC: Recent Labs  Lab 05/15/20 1350 05/20/20 1955  WBC 8.3 6.3  NEUTROABS 6.3 5.4  HGB 9.6* 8.7*  HCT 29.2* 27.3*  MCV 90.1 96.1  PLT 141* 062    Basic Metabolic Panel: Recent Labs  Lab 05/15/20 1350 05/20/20 1955  NA 137 140  K 3.0* 2.7*  CL 99 100  CO2 23 27  GLUCOSE 100* 82  BUN 10 7*  CREATININE 0.61 0.53  CALCIUM 7.9* 8.0*  MG  --  1.9    GFR: Estimated Creatinine Clearance: 76.3 mL/min (by C-G formula based on SCr of 0.53 mg/dL).  Liver Function Tests: Recent Labs  Lab 05/15/20 1350  AST 79*  ALT 34  ALKPHOS 275*  BILITOT 2.0*  PROT 4.6*  ALBUMIN 2.5*    Urine analysis:    Component Value Date/Time   COLORURINE YELLOW 05/03/2020 0415   APPEARANCEUR HAZY (A) 05/03/2020 0415   LABSPEC 1.011 05/03/2020 0415   PHURINE 6.0 05/03/2020 0415   GLUCOSEU NEGATIVE 05/03/2020 0415   GLUCOSEU NEGATIVE 11/05/2012 1559   HGBUR LARGE (A) 05/03/2020 0415   BILIRUBINUR NEGATIVE 05/03/2020 0415   BILIRUBINUR neg 08/10/2018 1529   KETONESUR 20 (A) 05/03/2020 0415   PROTEINUR 30 (A) 05/03/2020 0415   UROBILINOGEN 0.2 08/10/2018 1529   UROBILINOGEN 0.2 11/05/2012 1559   NITRITE NEGATIVE 05/03/2020 0415   LEUKOCYTESUR LARGE (A) 05/03/2020 0415    Radiological Exams on Admission: No results found.   Assessment/Plan Principal Problem:   Thiamine deficiency [name] is admitted per request from Neurology.  Received first dose of thiamine in ER and will treat with 500 mg IV TID x 3 days, then 250 mg IV TID x 3 days, then switch to 100 mg po qd indefinitely per recommendation.    Active Problems:   Hypokalemia Replete potassium with IV runs. Magnesium level is normal.  Check electrolytes and renal function in am.  IVF hydration.     Metastatic malignant neoplasm  Followed by Oncology    Anemia, chronic disease Chronic and stable. Repeat CBC in am    Essential hypertension Continue home dose of Cozaar.  Monitor blood pressure    DVT prophylaxis: Lovenox for DVT prophylaxis Code Status:   DNR Family Communication:  Diagnosis and plan discussed with patient.  Patient verbalized understanding agrees with plan.  Further recommendations to follow as clinical indicated Disposition Plan:   Patient is from:  SNF  Anticipated DC to:  SNF  Anticipated DC date:  Anticipate greater than 2 midnight stay in the hospital to treat acute condition  Anticipated DC barriers: No barriers to discharge identified at this time  Consults called:  Oncology Admission status:  Inpatient   Yevonne Aline Chotiner MD [facility]  How to contact the [facility] Attending or Consulting provider [place] or covering provider during after hours [place], for this patient?   1. Check the care team in [facility] and look for a) attending/consulting [facility] provider listed and b) the [facility] team listed 2. Log into [email or web address] and use [facility]'s universal password to access. If you do not have the password, please contact the hospital operator. 3. Locate the [facility] provider you are looking for under [facility] and page to a number that you can be directly reached. 4. If you still have difficulty reaching the provider, please page the [facility] (Director on Call) for the Hospitalists listed on amion for assistance.  05/20/2020, 10:41 PM

## 2020-05-21 DIAGNOSIS — I10 Essential (primary) hypertension: Secondary | ICD-10-CM

## 2020-05-21 DIAGNOSIS — E519 Thiamine deficiency, unspecified: Secondary | ICD-10-CM | POA: Diagnosis not present

## 2020-05-21 DIAGNOSIS — E876 Hypokalemia: Secondary | ICD-10-CM | POA: Diagnosis not present

## 2020-05-21 LAB — BASIC METABOLIC PANEL
Anion gap: 14 (ref 5–15)
BUN: 8 mg/dL (ref 8–23)
CO2: 26 mmol/L (ref 22–32)
Calcium: 7.6 mg/dL — ABNORMAL LOW (ref 8.9–10.3)
Chloride: 97 mmol/L — ABNORMAL LOW (ref 98–111)
Creatinine, Ser: 0.49 mg/dL (ref 0.44–1.00)
GFR, Estimated: >60 mL/min (ref >60)
Glucose, Bld: 90 mg/dL (ref 70–99)
Potassium: 2.7 mmol/L — CL (ref 3.5–5.1)
Sodium: 137 mmol/L (ref 135–145)

## 2020-05-21 LAB — CBC
HCT: 27.6 % — ABNORMAL LOW (ref 36.0–46.0)
Hemoglobin: 8.8 g/dL — ABNORMAL LOW (ref 12.0–15.0)
MCH: 30.9 pg (ref 26.0–34.0)
MCHC: 31.9 g/dL (ref 30.0–36.0)
MCV: 96.8 fL (ref 80.0–100.0)
Platelets: 197 10*3/uL (ref 150–400)
RBC: 2.85 MIL/uL — ABNORMAL LOW (ref 3.87–5.11)
RDW: 26.7 % — ABNORMAL HIGH (ref 11.5–15.5)
WBC: 6.1 10*3/uL (ref 4.0–10.5)
nRBC: 0.7 % — ABNORMAL HIGH (ref 0.0–0.2)

## 2020-05-21 LAB — SARS CORONAVIRUS 2 (TAT 6-24 HRS): SARS Coronavirus 2: NEGATIVE

## 2020-05-21 MED ORDER — PROCHLORPERAZINE MALEATE 10 MG PO TABS: 10.0000 mg | ORAL_TABLET | Freq: Four times a day (QID) | ORAL | Status: DC | PRN

## 2020-05-21 MED ORDER — ONDANSETRON HCL 4 MG PO TABS: 4.0000 mg | ORAL_TABLET | Freq: Four times a day (QID) | ORAL | Status: DC | PRN

## 2020-05-21 MED ORDER — ANASTROZOLE 1 MG PO TABS
1.0000 mg | ORAL_TABLET | Freq: Every day | ORAL | Status: DC
  Administered 2020-05-21 – 2020-05-31 (×11): 1 mg via ORAL
  Filled 2020-05-21 (×11): qty 1

## 2020-05-21 MED ORDER — LOSARTAN POTASSIUM 25 MG PO TABS
12.5000 mg | ORAL_TABLET | Freq: Every day | ORAL | Status: DC
  Administered 2020-05-21 – 2020-05-30 (×10): 12.5 mg via ORAL
  Filled 2020-05-21 (×5): qty 1
  Filled 2020-05-21: qty 0.5
  Filled 2020-05-21 (×4): qty 1

## 2020-05-21 MED ORDER — SODIUM CHLORIDE 0.9% FLUSH
10.0000 mL | Freq: Two times a day (BID) | INTRAVENOUS | Status: DC
  Administered 2020-05-23 – 2020-05-28 (×6): 10 mL

## 2020-05-21 MED ORDER — ACETAMINOPHEN 650 MG RE SUPP: 650.0000 mg | Freq: Four times a day (QID) | RECTAL | Status: DC | PRN

## 2020-05-21 MED ORDER — ACETAMINOPHEN 325 MG PO TABS
650.0000 mg | ORAL_TABLET | Freq: Four times a day (QID) | ORAL | Status: DC | PRN
  Administered 2020-05-25 – 2020-05-30 (×2): 650 mg via ORAL
  Filled 2020-05-21 (×4): qty 2

## 2020-05-21 MED ORDER — CHLORHEXIDINE GLUCONATE CLOTH 2 % EX PADS
6.0000 | MEDICATED_PAD | Freq: Every day | CUTANEOUS | Status: DC
  Administered 2020-05-22 – 2020-05-31 (×10): 6 via TOPICAL

## 2020-05-21 MED ORDER — THIAMINE HCL 100 MG/ML IJ SOLN
500.0000 mg | Freq: Three times a day (TID) | INTRAVENOUS | Status: AC
  Administered 2020-05-21 – 2020-05-23 (×6): 500 mg via INTRAVENOUS
  Filled 2020-05-21 (×8): qty 5

## 2020-05-21 MED ORDER — THIAMINE HCL 100 MG/ML IJ SOLN
500.0000 mg | Freq: Every day | INTRAVENOUS | Status: DC
  Filled 2020-05-21 (×2): qty 5

## 2020-05-21 MED ORDER — DRONABINOL 2.5 MG PO CAPS
2.5000 mg | ORAL_CAPSULE | Freq: Every day | ORAL | Status: DC
  Administered 2020-05-22 – 2020-05-29 (×7): 2.5 mg via ORAL
  Filled 2020-05-21 (×7): qty 1

## 2020-05-21 MED ORDER — ONDANSETRON HCL 4 MG/2ML IJ SOLN
4.0000 mg | Freq: Four times a day (QID) | INTRAMUSCULAR | Status: DC | PRN
  Administered 2020-05-24: 4 mg via INTRAVENOUS
  Filled 2020-05-21 (×2): qty 2

## 2020-05-21 MED ORDER — POTASSIUM CHLORIDE 10 MEQ/100ML IV SOLN
10.0000 meq | INTRAVENOUS | Status: AC
  Administered 2020-05-21 (×6): 10 meq via INTRAVENOUS
  Filled 2020-05-21 (×6): qty 100

## 2020-05-21 MED ORDER — ENOXAPARIN SODIUM 40 MG/0.4ML ~~LOC~~ SOLN
40.0000 mg | SUBCUTANEOUS | Status: DC
  Administered 2020-05-21 – 2020-05-31 (×11): 40 mg via SUBCUTANEOUS
  Filled 2020-05-21 (×11): qty 0.4

## 2020-05-21 MED ORDER — THIAMINE HCL 100 MG PO TABS
100.0000 mg | ORAL_TABLET | Freq: Every day | ORAL | Status: DC
  Administered 2020-05-21 – 2020-05-31 (×11): 100 mg via ORAL
  Filled 2020-05-21 (×11): qty 1

## 2020-05-21 MED ORDER — SODIUM CHLORIDE 0.9% FLUSH
10.0000 mL | INTRAVENOUS | Status: DC | PRN
Start: 2020-05-21 — End: 2020-05-31
  Administered 2020-05-27 (×2): 10 mL

## 2020-05-21 MED ORDER — SODIUM CHLORIDE 0.9 % IV SOLN: INTRAVENOUS | Status: DC

## 2020-05-21 NOTE — Progress Notes (Signed)
Brooke [name]   DOB:[birth year]   AJ#:[id number]   MBT#:[id number]  Subjective:  Brooke [name] was found to have an undetectable thiamine level. Neurology indicated this required urgent IV replacement and she was admitted yesterday and started on IV thiamine. Today I find she is mentally at baseline--actually remembered my nurse's phone number, was able to give me a clear account of her experiences at [name]. Functionally she is still far from independent in ADL but remains very motivated and is working with Rehab at the facility with the goal of returning to her apartment at some point. Denies h/a, visual changes, N/V; still has some taste perversion; swallowing is improved though nutrition still poor. Denies diarrhea.   Objective: white woman examined in bed Vitals:   05/21/20 0330 05/21/20 0440  BP: (!) 137/57 134/75  Pulse: 92   Resp: 16 18  Temp:  97.8 F (36.6 C)  SpO2: 98% 98%    Body mass index is 26.89 kg/m.  Intake/Output Summary (Last 24 hours) at 05/21/2020 0814 Last data filed at 05/21/2020 0600 Gross per 24 hour  Intake 55.82 ml  Output 501 ml  Net -445.18 ml     Sclerae unicteric  Lungs no rales or wheezes--auscultated anterolaterally  Heart regular rate and rhythm  Abdomen soft, few BS  Neuro A&O x3, bilateral LE weakness as before (3/5)   CBG (last 3)  No results for input(s): GLUCAP in the last 72 hours.   Labs:  Lab Results  Component Value Date   WBC 6.1 05/21/2020   HGB 8.8 (L) 05/21/2020   HCT 27.6 (L) 05/21/2020   MCV 96.8 05/21/2020   PLT 197 05/21/2020   NEUTROABS 5.4 05/20/2020    _0 @  Urine Studies No results for input(s): UHGB, CRYS in the last 72 hours.  Invalid input(s): UACOL, UAPR, USPG, UPH, UTP, UGL, UKET, UBIL, UNIT, UROB, ULEU, UEPI, UWBC, URBC, UBAC, CAST, UCOM, BILUA  Basic Metabolic Panel: Recent Labs  Lab 05/15/20 1350 05/20/20 1955 05/21/20 0530  NA 137 140 137  K 3.0* 2.7* 2.7*  CL 99 100 97*  CO2 _1 GLUCOSE 100* 82 90  BUN 10 7* 8  CREATININE 0.61 0.53 0.49  CALCIUM 7.9* 8.0* 7.6*  MG  --  1.9  --    GFR Estimated Creatinine Clearance: 76.3 mL/min (by C-G formula based on SCr of 0.49 mg/dL). Liver Function Tests: Recent Labs  Lab 05/15/20 1350  AST 79*  ALT 34  ALKPHOS 275*  BILITOT 2.0*  PROT 4.6*  ALBUMIN 2.5*   No results for input(s): LIPASE, AMYLASE in the last 168 hours. No results for input(s): AMMONIA in the last 168 hours. Coagulation profile No results for input(s): INR, PROTIME in the last 168 hours.  CBC: Recent Labs  Lab 05/15/20 1350 05/20/20 1955 05/21/20 0530  WBC 8.3 6.3 6.1  NEUTROABS 6.3 5.4  --   HGB 9.6* 8.7* 8.8*  HCT 29.2* 27.3* 27.6*  MCV 90.1 96.1 96.8  PLT 141* 197 197   Cardiac Enzymes: No results for input(s): CKTOTAL, CKMB, CKMBINDEX, TROPONINI in the last 168 hours. BNP: Invalid input(s): POCBNP CBG: No results for input(s): GLUCAP in the last 168 hours. D-Dimer No results for input(s): DDIMER in the last 72 hours. Hgb A1c No results for input(s): HGBA1C in the last 72 hours. Lipid Profile No results for input(s): CHOL, HDL, LDLCALC, TRIG, CHOLHDL, LDLDIRECT in the last 72 hours. Thyroid function studies No results for input(s): TSH, T4TOTAL, T3FREE, THYROIDAB in  the last 72 hours.  Invalid input(s): FREET3 Anemia work up No results for input(s): VITAMINB12, FOLATE, FERRITIN, TIBC, IRON, RETICCTPCT in the last 72 hours. Microbiology Recent Results (from the past 240 hour(s))  SARS CORONAVIRUS 2 (TAT 6-24 HRS) Nasopharyngeal Nasopharyngeal Swab     Status: None   Collection Time: 05/11/20  5:45 PM   Specimen: Nasopharyngeal Swab  Result Value Ref Range Status   SARS Coronavirus 2 NEGATIVE NEGATIVE Final    Comment: (NOTE) SARS-CoV-2 target nucleic acids are NOT DETECTED.  The SARS-CoV-2 RNA is generally detectable in upper and lower respiratory specimens during the acute phase of infection. Negative results do not  preclude SARS-CoV-2 infection, do not rule out co-infections with other pathogens, and should not be used as the sole basis for treatment or other patient management decisions. Negative results must be combined with clinical observations, patient history, and epidemiological information. The expected result is Negative.  Fact Sheet for Patients: SugarRoll.be  Fact Sheet for Healthcare Providers: https://www.woods-mathews.com/  This test is not yet approved or cleared by the [place] FDA and  has been authorized for detection and/or diagnosis of SARS-CoV-2 by FDA under an Emergency Use Authorization (EUA). This EUA will remain  in effect (meaning this test can be used) for the duration of the COVID-19 declaration under Se ction 564(b)(1) of the Act, 21 U.S.C. section 360bbb-3(b)(1), unless the authorization is terminated or revoked sooner.  Performed at [facility], [place] [address], [place], [place] [address]   SARS CORONAVIRUS 2 (TAT 6-24 HRS) Nasopharyngeal Nasopharyngeal Swab     Status: None   Collection Time: 05/20/20 10:01 PM   Specimen: Nasopharyngeal Swab  Result Value Ref Range Status   SARS Coronavirus 2 NEGATIVE NEGATIVE Final    Comment: (NOTE) SARS-CoV-2 target nucleic acids are NOT DETECTED.  The SARS-CoV-2 RNA is generally detectable in upper and lower respiratory specimens during the acute phase of infection. Negative results do not preclude SARS-CoV-2 infection, do not rule out co-infections with other pathogens, and should not be used as the sole basis for treatment or other patient management decisions. Negative results must be combined with clinical observations, patient history, and epidemiological information. The expected result is Negative.  Fact Sheet for Patients: SugarRoll.be  Fact Sheet for Healthcare Providers: https://www.woods-mathews.com/  This test  is not yet approved or cleared by the [place] FDA and  has been authorized for detection and/or diagnosis of SARS-CoV-2 by FDA under an Emergency Use Authorization (EUA). This EUA will remain  in effect (meaning this test can be used) for the duration of the COVID-19 declaration under Se ction 564(b)(1) of the Act, 21 U.S.C. section 360bbb-3(b)(1), unless the authorization is terminated or revoked sooner.  Performed at [facility], [place] [address], [place], [place] [address]       Studies:  No results found.  Assessment: 70 y.o. [place] woman admitted for thiamine replacement, with a history of stage IV breast cancer as follows:  (1) status post bilateral breast biopsies 01/26/2017, showing: in the right breast, a complex sclerosing lesion, status post lumpectomy 09/25/2017, with no malignancy identified.  (2) in the left breast, a cT2 pN1 invasive ductal carcinoma (with some lobular features but E-cadherin positive), grade 2, estrogen and progesterone receptor positive, HER-2 not amplified, with an MIB-1 of 15-20%             (a) breast MRI 02/04/2017 suggests a T3 N1 tumor  (3) started neoadjuvant cyclophosphamide/docetaxel 03/03/2017, discontinued after 1  cycle with very poor tolerance             (a) started cyclophosphamide/methotrexate/fluorouracil (CMF) 03/28/2017, repeated x7 cycles, last dose 08/01/2017  (4) status post left modified radical mastectomy 09/25/17 for a ypT5 ypN2, stage IIIA invasive ductal carcinoma, grade 2, again estrogen and progesterone receptor positive, HER-2 not amplified  (5) adjuvant radiation completed 12/21/2017             (a) capecitabine sensitization tolerated only the initial week of radiation (b) Site/dose:The patient initially received a dose of 50.4 Gy in 28 fractions to the leftchest wall andleftsupraclavicular region. This was delivered using a 3-D conformal, 4 field technique. The patient then received a boost to the  mastectomy scar. This delivered an additional 10 Gy in 5 fractions using an en face electron field. The total dose was 60.4 Gy.  ( 6) tamoxifen started 03/09/2018, discontinued January 2022 with metastatic progression  (7) genetics testing 12/12/2017 through the Multi-Gene Panel offered by [facility] found no deleterious mutations in AIP, ALK, APC, ATM, AXIN2,BAP1,  BARD1, BLM, BMPR1A, BRCA1, BRCA2, BRIP1, CASR, CDC73, CDH1, CDK4, CDKN1B, CDKN1C, CDKN2A (p14ARF), CDKN2A (p16INK4a), CEBPA, CHEK2, CTNNA1, DICER1, DIS3L2, EGFR (c.2369C>T, p.Thr790Met variant only), EPCAM (Deletion/duplication testing only), FH, FLCN, GATA2, GPC3, GREM1 (Promoter region deletion/duplication testing only), HOXB13 (c.251G>A, p.Gly84Glu), HRAS, KIT, MAX, MEN1, MET, MITF (c.952G>A, p.Glu318Lys variant only), MLH1, MSH2, MSH3, MSH6, MUTYH, NBN, NF1, NF2, NTHL1, PALB2, PDGFRA, PHOX2B, PMS2, POLD1, POLE, POT1, PRKAR1A, PTCH1, PTEN, RAD50, RAD51C, RAD51D, RB1, RECQL4, RET, RUNX1, SDHAF2, SDHA (sequence changes only), SDHB, SDHC, SDHD, SMAD4, SMARCA4, SMARCB1, SMARCE1, STK11, SUFU, TERC, TERT, TMEM127, TP53, TSC1, TSC2, VHL, WRN and WT1.   (8) Foundation 1 testing shows stable microsatellite status and 1 mutation/ Mb, TP53 mutations x2             (a) PD-L1 dated July 2019 negative  METASTATIC DISEASE: January 2022 (9) CT angio of the chest 02/27/2020 shows multiple hilar nodes consistent with prior granulomatous infection, multiple small pulmonary nodules, multiple punctate granulomas throughout the liver and spleen, nodular hepatic contour and areas of mottling possibly due to steatosis, 1.6 cm left adrenal nodule             (a) PET scan 03/05/2020 shows mildly to highly hypermetabolic adenopathy, the pulmonary nodules being mildly to not hypermetabolic, a large right hepatic liver lesion with an SUV of 13.2 and additional hypermetabolic right and left lobe foci, the adrenal lesion being hypermetabolic with an SUV of 16.  There  are also peritoneal nodules and innumerable foci of bony involvement.  Incidental note was made of small heterogeneous foci of increased uptake in the cerebellum.  The CT images did not show gross abnormality in the visualized brain             (b) on 03/09/2020 CA 27-29 was 983.0, CEA 744.17, the CA 19-9 normal at 32             (c) liver biopsy 03/13/2020 confirms metastati carcinoma, estrogen receptor positive, progesterone and HER 2 negative, with an Mib-1 of 40%             (d) brain MRI 03/26/2020 shows dural involvement including a 1.0 cm dural-based cerebellar lesion  (10) paclitaxel started 03/30/2020, repeated x3, last dose 04/30/2020 w/o evidence of response  (11) zoledronate started 03/19/2020, to be repeated every 12 weeks (next dose 06/11/2020)  (12)anastrozolestarted 05/07/2020 (a) palbociclib 125 mg daily, 21 days off, 7 days off, started 05/17/2018  Plan:  Brooke [name] is receiving high-dose thiamine replacement. Her potassium is also being replaced. Unfortunately the thiamine could not be given at [facility] and she had to be admitted. Plan is for her to return to [place] after 9 doses of IV  thiamine and that means she could potentially be discharged Sat 03/26 PM or Nancy Fetter AM-- case manager can alert [name] to make sure they have a room for her and discharge is not delayed.  I am adding oral thiamine which she can continue as outpatient. She refuses oral potassium supplements.  She is tolerating the anastrozole + palbociclib w/o any unusual side effects. She has a follow-up with me 06/11/2020 and is aware.  I will not be available this weekend but please contact my partners as needed. Appreciate your help to this patient!       Chauncey Cruel, MD 05/21/2020  8:14 AM Medical Oncology and Hematology [facility] [address] [place], [place] [address] Tel. [phone]    Fax. [phone]

## 2020-05-21 NOTE — TOC Initial Note (Signed)
Transition of Care [facility]) - Initial/Assessment Note    Patient Details  Name: Brooke [name] MRN: [id number] Date of Birth: 10-31-1950  [facility] CM/SW Contact:    Dessa Phi, RN Phone Number: 05/21/2020, 2:11 PM  Clinical Narrative:  From [facility]NF rep Janie confirmed. D/c plan to return.                 Expected Discharge Plan: Skilled Nursing Facility Barriers to Discharge: Continued Medical Work up   Patient Goals and CMS Choice Patient states their goals for this hospitalization and ongoing recovery are:: return to [employer] Medicare.gov Compare Post Acute Care list provided to:: Patient Choice offered to / list presented to : Patient  Expected Discharge Plan and Services Expected Discharge Plan: [place]   Discharge Planning Services: CM Consult   Living arrangements for the past 2 months: [place]                                      Prior Living Arrangements/Services Living arrangements for the past 2 months: [place] Lives with:: Facility Resident Patient language and need for interpreter reviewed:: Yes Do you feel safe going back to the place where you live?: Yes      Need for Family Participation in Patient Care: No (Comment) Care giver support system in place?: Yes (comment) Current home services:  (w/c) Criminal Activity/Legal Involvement Pertinent to Current Situation/Hospitalization: No - Comment as needed  Activities of Daily Living      Permission Sought/Granted Permission sought to share information with : Case Manager Permission granted to share information with : Yes, Verbal Permission Granted  Share Information with NAME: Case Manager           Emotional Assessment Appearance:: Appears stated age Attitude/Demeanor/Rapport: Gracious Affect (typically observed): Accepting Orientation: : Oriented to Self,Oriented to Place,Oriented to  Time,Oriented to  Situation Alcohol / Substance Use: Not Applicable Psych Involvement: No (comment)  Admission diagnosis:  Hypokalemia [E87.6] Thiamine deficiency [E51.9] Patient Active Problem List   Diagnosis Date Noted  . Thiamine deficiency 05/20/2020  . Hypokalemia 05/20/2020  . Anemia, chronic disease 05/20/2020  . Essential hypertension 05/20/2020  . B12 deficiency anemia 05/11/2020  . Pressure injury of skin 05/08/2020  . Malnutrition of moderate degree 05/05/2020  . Intractable vomiting with nausea 05/03/2020  . Acute lower UTI 04/21/2020  . Dehydration 04/21/2020  . Depression with anxiety 04/21/2020  . Transaminitis 04/21/2020  . Metastatic malignant neoplasm ([place])   . Nausea vomiting and diarrhea   . Intractable nausea and vomiting 04/20/2020  . PICC (peripherally inserted central catheter) in place 04/17/2020  . Bone metastases ([place]) 03/11/2020  . Liver metastases ([place]) 03/05/2020  . Lung metastases ([place]) 03/05/2020  . Postoperative state 04/23/2019  . Superficial basal cell carcinoma (BCC) 02/10/2019  . Goals of care, counseling/discussion 12/14/2017  . Family history of uterine cancer   . Family history of prostate cancer   . Melanoma in situ of right upper extremity ([place]) 07/18/2017  . Port-A-Cath in place 04/18/2017  . Drug-induced neutropenia ([place]) 03/09/2017  . Family history of breast cancer 02/02/2017  . Family history of colon cancer 02/02/2017  . Family history of prostate cancer in father 02/02/2017  . Malignant neoplasm of overlapping sites of left breast in female, estrogen receptor positive ([place]) 01/31/2017  . Hx of hot flashes, menopausal, HRT in the past 12/19/2011  .  Osteopenia, stable, DEXA 2013, recs to repeat in 2018 12/19/2011  . Hx of Hyperlipidemia, LDL 144 in 07/2011 12/19/2011  . Vitamin D insufficiency 12/19/2011  . Fibroid    PCP:  [name], MD Pharmacy:   [place], [place] [address] Phone: [phone] Fax: [place], [place] [address] Phone: [phone] Fax: [place], [place] [address] [place] [address] Phone: [phone] Fax: [phone]     Social Determinants of Health (SDOH) Interventions    Readmission Risk Interventions Readmission Risk Prevention Plan 05/04/2020  Transportation Screening Complete  PCP or Specialist Appt within 3-5 Days Complete  HRI or [place] Complete  Social Work Consult for [place] Planning/Counseling Complete  Palliative Care Screening Complete  Medication Review [employer] photographer) Complete  Some recent data might be hidden

## 2020-05-21 NOTE — Progress Notes (Signed)
PROGRESS NOTE    Brooke [name]  POE:[id number] DOB: Nov 04, 1950 DOA: 05/20/2020 PCP: [name], MD    Brief Narrative:  70 y.o. female with medical history significant for HTN, metastatic breast cancer. She has been receiving chemotherapy and is followed by Dr. [name]. Patient has had progressive weakness is currently residing at a nursing facility. Patient had outpatient laboratory tests on March 13 and results reported yesterday and she was noted to have undectable vitamin B1 levels. Patient's B12 level is also decreased. Patient was called with the result and instructed to come to the ED because she needs IV replacement therapy. Dr. [name] called the ED and stated the patient needs 500 mg of IV thiamine 3 times daily for the next 3 days. She was also reportedly seen by neurology recently for intermittent confusion and neurology recommended the IV thiamine replacement protocol  Assessment & Plan:   Principal Problem:   Thiamine deficiency Active Problems:   Metastatic malignant neoplasm (HCC)   Hypokalemia   Anemia, chronic disease   Essential hypertension  Principal Problem:   Thiamine deficiency -Brooke [name] is admitted per request from Hematology service, appreciate input by Dr. [name] -Received first dose of thiamine in ER, plan to treat with 500 mg IV TID x 3 days, then 250 mg IV TID x 3 days, then switch to 100 mg po qd indefinitely per recommendation.    Active Problems:   Hypokalemia Replaced this AM Repeat bmet in AM and continue to replace as needed    Metastatic malignant neoplasm  Followed by Oncology, appreciate in put by Dr. [name]    Anemia, chronic disease Chronic and stable. Recheck cbc in AM    Essential hypertension Continue home dose of Cozaar.  BP stable at this time   DVT prophylaxis: Lovenox subq Code Status: DNR Family Communication: Pt in room, family not at bedside  Status is: Inpatient  Remains inpatient  appropriate because:IV treatments appropriate due to intensity of illness or inability to take PO   Dispo: The patient is from: SNF              Anticipated d/c is to: SNF              Patient currently is not medically stable to d/c.   Difficult to place patient No    Consultants:   Oncology  Procedures:     Antimicrobials: Anti-infectives (From admission, onward)   None       Subjective: Complaining of feeling uncomfortable while in bed  Objective: Vitals:   05/21/20 0330 05/21/20 0440 05/21/20 0856 05/21/20 1441  BP: (!) 137/57 134/75 126/60 126/61  Pulse: 92  87 89  Resp: 16 18 20 20   Temp:  97.8 F (36.6 C) 97.6 F (36.4 C) 98.2 F (36.8 C)  TempSrc:  Oral Oral Oral  SpO2: 98% 98% 96% 96%  Weight:      Height:        Intake/Output Summary (Last 24 hours) at 05/21/2020 1721 Last data filed at 05/21/2020 0600 Gross per 24 hour  Intake 55.82 ml  Output 501 ml  Net -445.18 ml   Filed Weights   05/20/20 1932  Weight: 82.6 kg    Examination:  General exam: Appears calm and comfortable  Respiratory system: Clear to auscultation. Respiratory effort normal. Cardiovascular system: S1 & S2 heard, Regular Gastrointestinal system: Abdomen is nondistended, soft and nontender. No organomegaly or masses felt. Normal bowel sounds heard. Central nervous system: Alert and oriented. No  focal neurological deficits. Extremities: Symmetric 5 x 5 power. Skin: No rashes, lesions  Psychiatry: Judgement and insight appear normal. Mood & affect appropriate.   Data Reviewed: I have personally reviewed following labs and imaging studies  CBC: Recent Labs  Lab 05/15/20 1350 05/20/20 1955 05/21/20 0530  WBC 8.3 6.3 6.1  NEUTROABS 6.3 5.4  --   HGB 9.6* 8.7* 8.8*  HCT 29.2* 27.3* 27.6*  MCV 90.1 96.1 96.8  PLT 141* 197 213   Basic Metabolic Panel: Recent Labs  Lab 05/15/20 1350 05/20/20 1955 05/21/20 0530  NA 137 140 137  K 3.0* 2.7* 2.7*  CL 99 100 97*   CO2 23 27 26   GLUCOSE 100* 82 90  BUN 10 7* 8  CREATININE 0.61 0.53 0.49  CALCIUM 7.9* 8.0* 7.6*  MG  --  1.9  --    GFR: Estimated Creatinine Clearance: 76.3 mL/min (by C-G formula based on SCr of 0.49 mg/dL). Liver Function Tests: Recent Labs  Lab 05/15/20 1350  AST 79*  ALT 34  ALKPHOS 275*  BILITOT 2.0*  PROT 4.6*  ALBUMIN 2.5*   No results for input(s): LIPASE, AMYLASE in the last 168 hours. No results for input(s): AMMONIA in the last 168 hours. Coagulation Profile: No results for input(s): INR, PROTIME in the last 168 hours. Cardiac Enzymes: No results for input(s): CKTOTAL, CKMB, CKMBINDEX, TROPONINI in the last 168 hours. BNP (last 3 results) No results for input(s): PROBNP in the last 8760 hours. HbA1C: No results for input(s): HGBA1C in the last 72 hours. CBG: No results for input(s): GLUCAP in the last 168 hours. Lipid Profile: No results for input(s): CHOL, HDL, LDLCALC, TRIG, CHOLHDL, LDLDIRECT in the last 72 hours. Thyroid Function Tests: No results for input(s): TSH, T4TOTAL, FREET4, T3FREE, THYROIDAB in the last 72 hours. Anemia Panel: No results for input(s): VITAMINB12, FOLATE, FERRITIN, TIBC, IRON, RETICCTPCT in the last 72 hours. Sepsis Labs: No results for input(s): PROCALCITON, LATICACIDVEN in the last 168 hours.  Recent Results (from the past 240 hour(s))  SARS CORONAVIRUS 2 (TAT 6-24 HRS) Nasopharyngeal Nasopharyngeal Swab     Status: None   Collection Time: 05/11/20  5:45 PM   Specimen: Nasopharyngeal Swab  Result Value Ref Range Status   SARS Coronavirus 2 NEGATIVE NEGATIVE Final    Comment: (NOTE) SARS-CoV-2 target nucleic acids are NOT DETECTED.  The SARS-CoV-2 RNA is generally detectable in upper and lower respiratory specimens during the acute phase of infection. Negative results do not preclude SARS-CoV-2 infection, do not rule out co-infections with other pathogens, and should not be used as the sole basis for treatment or other  patient management decisions. Negative results must be combined with clinical observations, patient history, and epidemiological information. The expected result is Negative.  Fact Sheet for Patients: SugarRoll.be  Fact Sheet for Healthcare Providers: https://www.woods-mathews.com/  This test is not yet approved or cleared by the [place] FDA and  has been authorized for detection and/or diagnosis of SARS-CoV-2 by FDA under an Emergency Use Authorization (EUA). This EUA will remain  in effect (meaning this test can be used) for the duration of the COVID-19 declaration under Se ction 564(b)(1) of the Act, 21 U.S.C. section 360bbb-3(b)(1), unless the authorization is terminated or revoked sooner.  Performed at [facility], [place] [address] [place], [place] [address]   SARS CORONAVIRUS 2 (TAT 6-24 HRS) Nasopharyngeal Nasopharyngeal Swab     Status: None   Collection Time: 05/20/20 10:01 PM   Specimen: Nasopharyngeal  Swab  Result Value Ref Range Status   SARS Coronavirus 2 NEGATIVE NEGATIVE Final    Comment: (NOTE) SARS-CoV-2 target nucleic acids are NOT DETECTED.  The SARS-CoV-2 RNA is generally detectable in upper and lower respiratory specimens during the acute phase of infection. Negative results do not preclude SARS-CoV-2 infection, do not rule out co-infections with other pathogens, and should not be used as the sole basis for treatment or other patient management decisions. Negative results must be combined with clinical observations, patient history, and epidemiological information. The expected result is Negative.  Fact Sheet for Patients: SugarRoll.be  Fact Sheet for Healthcare Providers: https://www.woods-mathews.com/  This test is not yet approved or cleared by the [place] FDA and  has been authorized for detection and/or diagnosis of SARS-CoV-2 by FDA under an  Emergency Use Authorization (EUA). This EUA will remain  in effect (meaning this test can be used) for the duration of the COVID-19 declaration under Se ction 564(b)(1) of the Act, 21 U.S.C. section 360bbb-3(b)(1), unless the authorization is terminated or revoked sooner.  Performed at [facility], [place] [address], [place], [place] [address]      Radiology Studies: No results found.  Scheduled Meds: . anastrozole  1 mg Oral Daily  . Chlorhexidine Gluconate Cloth  6 each Topical Daily  . dronabinol  2.5 mg Oral QAC lunch  . enoxaparin (LOVENOX) injection  40 mg Subcutaneous Q24H  . losartan  12.5 mg Oral Daily  . potassium chloride  40 mEq Oral Once  . sodium chloride flush  10-40 mL Intracatheter Q12H  . thiamine  500 mg Intravenous Q8H  . thiamine  100 mg Oral Daily   Continuous Infusions: . sodium chloride 100 mL/hr at 05/21/20 1357  . potassium chloride Stopped (05/20/20 2221)     LOS: 1 day   Marylu Lund, MD [facility] Pager On Amion  If 7PM-7AM, please contact night-coverage 05/21/2020, 5:21 PM

## 2020-05-21 NOTE — Progress Notes (Signed)
Patient received Potassium IV x2  K+ remains 2.7

## 2020-05-22 ENCOUNTER — Ambulatory Visit: Payer: Medicare PPO

## 2020-05-22 DIAGNOSIS — E519 Thiamine deficiency, unspecified: Principal | ICD-10-CM

## 2020-05-22 DIAGNOSIS — I10 Essential (primary) hypertension: Secondary | ICD-10-CM | POA: Diagnosis not present

## 2020-05-22 LAB — CBC
HCT: 26.3 % — ABNORMAL LOW (ref 36.0–46.0)
Hemoglobin: 8.3 g/dL — ABNORMAL LOW (ref 12.0–15.0)
MCH: 31 pg (ref 26.0–34.0)
MCHC: 31.6 g/dL (ref 30.0–36.0)
MCV: 98.1 fL (ref 80.0–100.0)
Platelets: 187 10*3/uL (ref 150–400)
RBC: 2.68 MIL/uL — ABNORMAL LOW (ref 3.87–5.11)
RDW: 27.3 % — ABNORMAL HIGH (ref 11.5–15.5)
WBC: 5.6 10*3/uL (ref 4.0–10.5)
nRBC: 0 % (ref 0.0–0.2)

## 2020-05-22 LAB — COMPREHENSIVE METABOLIC PANEL
ALT: 20 U/L (ref 0–44)
AST: 47 U/L — ABNORMAL HIGH (ref 15–41)
Albumin: 2.2 g/dL — ABNORMAL LOW (ref 3.5–5.0)
Alkaline Phosphatase: 174 U/L — ABNORMAL HIGH (ref 38–126)
Anion gap: 11 (ref 5–15)
BUN: 7 mg/dL — ABNORMAL LOW (ref 8–23)
CO2: 25 mmol/L (ref 22–32)
Calcium: 7.7 mg/dL — ABNORMAL LOW (ref 8.9–10.3)
Chloride: 102 mmol/L (ref 98–111)
Creatinine, Ser: 0.54 mg/dL (ref 0.44–1.00)
GFR, Estimated: >60 mL/min (ref >60)
Glucose, Bld: 86 mg/dL (ref 70–99)
Potassium: 3.2 mmol/L — ABNORMAL LOW (ref 3.5–5.1)
Sodium: 138 mmol/L (ref 135–145)
Total Bilirubin: 1.6 mg/dL — ABNORMAL HIGH (ref 0.3–1.2)
Total Protein: 4.1 g/dL — ABNORMAL LOW (ref 6.5–8.1)

## 2020-05-22 LAB — OCCULT BLOOD X 1 CARD TO LAB, STOOL: Fecal Occult Bld: NEGATIVE

## 2020-05-22 MED ORDER — POTASSIUM CHLORIDE CRYS ER 20 MEQ PO TBCR: 40.0000 meq | EXTENDED_RELEASE_TABLET | Freq: Two times a day (BID) | ORAL | Status: DC

## 2020-05-22 MED ORDER — ADULT MULTIVITAMIN LIQUID CH
15.0000 mL | Freq: Every day | ORAL | Status: DC
  Filled 2020-05-22 (×9): qty 15

## 2020-05-22 MED ORDER — ADULT MULTIVITAMIN LIQUID CH
15.0000 mL | Freq: Every day | ORAL | Status: DC
  Administered 2020-05-22: 15 mL
  Filled 2020-05-22: qty 15

## 2020-05-22 MED ORDER — POTASSIUM CHLORIDE 10 MEQ/100ML IV SOLN
10.0000 meq | INTRAVENOUS | Status: AC
  Administered 2020-05-22 (×6): 10 meq via INTRAVENOUS
  Filled 2020-05-22 (×3): qty 100

## 2020-05-22 NOTE — Care Management Important Message (Signed)
Important Message  Patient Details IM Letter given to the Patient. Name: Brooke [name] MRN: [id number] Date of Birth: Jun 08, 1950   Medicare Important Message Given:  Yes     Kerin Salen 05/22/2020, 11:28 AM

## 2020-05-22 NOTE — Progress Notes (Signed)
Initial Nutrition Assessment  DOCUMENTATION CODES:   Non-severe (moderate) malnutrition in context of chronic illness  INTERVENTION:  - will order 15 ml liquid multivitamin/day. - monitor for recommendations by SLP. - will liberalize diet from Heart Healthy to Regular.  - discuss oral nutrition supplements at follow-up, if warranted.   NUTRITION DIAGNOSIS:   Moderate Malnutrition related to chronic illness,cancer and cancer related treatments as evidenced by mild fat depletion,mild muscle depletion,moderate muscle depletion.  GOAL:   Patient will meet greater than or equal to 90% of their needs  MONITOR:   PO intake,Labs,Weight trends  REASON FOR ASSESSMENT:   Malnutrition Screening Tool,Consult Assessment of nutrition requirement/status  ASSESSMENT:   70 y.o. female with medical history of HTN and metastatic breast cancer. She has been receiving chemotherapy. Patient has had progressive weakness and is residing at a nursing facility.  Patient had outpatient laboratory tests on 3/13 and results on 3/23 showed undectable vitamin B1 levels.  Patient's B12 level is also decreased.  Patient laying in bed with no family or visitors present. Lunch tray at bedside table, untouched. She consumed 0% of breakfast meal. Patient reports plan to try to take a few bites of lunch.   She was last assessed by a [place] RD on 3/8. At that time, PO intakes were extremely poor and patient had reported not eating well PTA.  During visit today, patient reports not eating well d/t food feeling like it gets stuck in her esophagus each time she eats. She has been experiencing this for several months or longer and it occurs with all solid foods.   She mainly consumes water throughout the day. Discussed the role of a SLP. Patient does not recall being seen by a SLP during last hospitalization, but is very interested in being assessed this hospitalization in an effort to make PO intakes more  comfortable.  Weight on 3/23 was 182 lb and weight on 2/21 was 184 lb. This indicates 2 lb weight loss (1.1% body weight) in the past 1 month; not significant for time frame. She is noted to have moderate pitting edema to BLE which may be falsely increasing weight.    Labs reviewed; K: 3.2 mmol/l, BUN: 7 mg/dl. Medications reviewed; 500 mg IV thiamine TID x3 days, 100 mg oral thiamine/day. IVF; NS @ 100 ml/hr.     NUTRITION - FOCUSED PHYSICAL EXAM:  Flowsheet Row Most Recent Value  Orbital Region Mild depletion  Upper Arm Region Mild depletion  Thoracic and Lumbar Region Unable to assess  Buccal Region Mild depletion  Temple Region Mild depletion  Clavicle Bone Region Moderate depletion  Clavicle and Acromion Bone Region Moderate depletion  Scapular Bone Region Unable to assess  Dorsal Hand Mild depletion  Patellar Region Unable to assess  Anterior Thigh Region Unable to assess  Posterior Calf Region Unable to assess  Edema (RD Assessment) Unable to assess  Hair Reviewed  Eyes Reviewed  Mouth Reviewed  Skin Reviewed  Nails Reviewed       Diet Order:   Diet Order            Diet Heart Room service appropriate? Yes; Fluid consistency: Thin  Diet effective now                 EDUCATION NEEDS:   Not appropriate for education at this time  Skin:  Skin Assessment: Skin Integrity Issues: Skin Integrity Issues:: Stage II Stage II: L buttocks  Last BM:  3/25 (type 6 x1)  Height:  Ht Readings from Last 1 Encounters:  05/20/20 5\' 9"  (1.753 m)    Weight:   Wt Readings from Last 1 Encounters:  05/20/20 82.6 kg    Ideal Body Weight:  65.91 kg  BMI:  Body mass index is 26.89 kg/m.  Estimated Nutritional Needs:   Kcal:  2000-2200 kcal  Protein:  100-115 grams  Fluid:  >/= 2 L/day     Jarome Matin, MS, RD, LDN, CNSC Inpatient Clinical Dietitian RD pager # available in AMION  After hours/weekend pager # available in [facility]

## 2020-05-22 NOTE — Progress Notes (Signed)
Physical Therapy Evaluation Patient Details Name: Brooke [name] MRN: [id number] DOB: 06-16-1950 Today's Date: 05/22/2020   History of Present Illness  70 y.o. female with medical history significant for HTN, metastatic breast cancer.  She has been receiving chemotherapy and is followed by Dr. [name].  Patient has had progressive weakness is currently residing at a nursing facility.  Patient had outpatient laboratory tests on March 13 and results reported yesterday and she was noted to have undectable vitamin B1 levels.  Patient's B12 level is also decreased.  Patient was called with the result and instructed to come to the ED because she needs IV replacement therapy.  Dr. [name] called the ED and stated the patient needs 500 mg of IV thiamine 3 times daily for the next 3 days. She was also reportedly seen by neurology recently for intermittent confusion and neurology recommended the IV thiamine replacement protocol.  Clinical Impression  Pt admitted with above diagnosis.  Pt currently with functional limitations due to the deficits listed below (see PT Problem List). Pt will benefit from skilled PT to increase their independence and safety with mobility to allow discharge to the venue listed below.  Pt assisted with sitting EOB however pt declined any further mobility or exercising. Pt appeared disgruntled with having to perform therapy today so assisted pt back to supine in comfortable positioning.  Pt admitted from SNF for rehab and plans to return to SNF.      Follow Up Recommendations SNF    Equipment Recommendations  Wheelchair cushion (measurements PT);Wheelchair (measurements PT)    Recommendations for Other Services       Precautions / Restrictions Precautions Precautions: Fall Restrictions Weight Bearing Restrictions: No      Mobility  Bed Mobility Overal bed mobility: Needs Assistance Bed Mobility: Supine to Sit;Sit to Supine     Supine to sit: Max assist Sit to  supine: Max assist   General bed mobility comments: encouraged pt to assist as able, guided LEs over EOB and required assist for trunk upright, assist for upper and lower body back to supine; required +2 for repositioning in bed (pt very specific about repositioning)    Transfers                 General transfer comment: pt declined  Ambulation/Gait                Stairs            Wheelchair Mobility    Modified Rankin (Stroke Patients Only)       Balance                                             Pertinent Vitals/Pain Pain Assessment: Faces Faces Pain Scale: Hurts whole lot Pain Location: bil LEs Pain Descriptors / Indicators: Grimacing;Tender Pain Intervention(s): Monitored during session;Repositioned    Home Living Family/patient expects to be discharged to:: Skilled nursing facility Living Arrangements: Alone Available Help at Discharge:  (pt stated she doesn't have assistance available, some family are visiting on 05/10/20 from "other parts of the country" but can only stay a few days.) Type of Home: Apartment Home Access: Stairs to enter Entrance Stairs-Rails: Left Entrance Stairs-Number of Steps: 3 Home Layout: One level Home Equipment: Walker - 2 wheels Additional Comments: above per admission 2 weeks ago, pt discharged to SNF for rehab  Prior Function Level of Independence: Needs assistance   Gait / Transfers Assistance Needed: has been nonambulatory since d/c to SNF from previous admission due to weakness     Comments: prior to last admission, pt was walking with RW     Hand Dominance   Dominant Hand: Left    Extremity/Trunk Assessment   Upper Extremity Assessment Upper Extremity Assessment: Generalized weakness RUE Deficits / Details: requesting assist to sip out of container however able to reach over and use UEs to assist with bed mobility    Lower Extremity Assessment Lower Extremity Assessment:  Generalized weakness RLE Deficits / Details: pt requesting assist for bil LEs for bed mobility due to weakness however only assist for guidance, grossly at least 2+/5 observed throughout LLE Deficits / Details: pt requesting assist for bil LEs for bed mobility due to weakness however only assist for guidance, grossly at least 2+/5 observed throughout    Cervical / Trunk Assessment Cervical / Trunk Assessment: Normal  Communication   Communication: No difficulties  Cognition Arousal/Alertness: Awake/alert Behavior During Therapy: Flat affect Overall Cognitive Status: No family/caregiver present to determine baseline cognitive functioning                                 General Comments: flat with minimal engagement, wondering why no one tells her when therapy is scheduled      General Comments      Exercises     Assessment/Plan    PT Assessment Patient needs continued PT services  PT Problem List Decreased strength;Decreased activity tolerance;Decreased balance;Decreased mobility       PT Treatment Interventions Gait training;Functional mobility training;Therapeutic activities;Therapeutic exercise;Patient/family education;DME instruction;Balance training    PT Goals (Current goals can be found in the Care Plan section)  Acute Rehab PT Goals PT Goal Formulation: With patient Time For Goal Achievement: 06/12/20 Potential to Achieve Goals: Fair    Frequency Min 2X/week   Barriers to discharge        Co-evaluation               AM-PAC PT "6 Clicks" Mobility  Outcome Measure Help needed turning from your back to your side while in a flat bed without using bedrails?: Total Help needed moving from lying on your back to sitting on the side of a flat bed without using bedrails?: Total Help needed moving to and from a bed to a chair (including a wheelchair)?: Total Help needed standing up from a chair using your arms (e.g., wheelchair or bedside chair)?:  Total Help needed to walk in hospital room?: Total Help needed climbing 3-5 steps with a railing? : Total 6 Click Score: 6    End of Session Equipment Utilized During Treatment: Gait belt Activity Tolerance: Other (comment) (pt self limiting) Patient left: in bed;with call bell/phone within reach;with bed alarm set Nurse Communication: Mobility status PT Visit Diagnosis: Difficulty in walking, not elsewhere classified (R26.2);Muscle weakness (generalized) (M62.81)    Time: 2119-4174 PT Time Calculation (min) (ACUTE ONLY): 15 min   Charges:   PT Evaluation $PT Eval Low Complexity: 1 Low     Kati PT, DPT Acute Rehabilitation Services Pager: [phone] Office: [phone] Trena Platt 05/22/2020, 12:47 PM

## 2020-05-22 NOTE — Progress Notes (Signed)
PROGRESS NOTE    Brooke [name]  CLE:[id number] DOB: 1950-08-09 DOA: 05/20/2020 PCP: Brooke Macadam, MD    Brief Narrative:  70 y.o. female with medical history significant for HTN, metastatic breast cancer. She has been receiving chemotherapy and is followed by Dr. Jana [name]. Patient has had progressive weakness is currently residing at a nursing facility. Patient had outpatient laboratory tests on March 13 and results reported yesterday and she was noted to have undectable vitamin B1 levels. Patient's B12 level is also decreased. Patient was called with the result and instructed to come to the ED because she needs IV replacement therapy. Dr. Jana [name] called the ED and stated the patient needs 500 mg of IV thiamine 3 times daily for the next 3 days. She was also reportedly seen by neurology recently for intermittent confusion and neurology recommended the IV thiamine replacement protocol  Assessment & Plan:   Principal Problem:   Thiamine deficiency Active Problems:   Metastatic malignant neoplasm (HCC)   Hypokalemia   Anemia, chronic disease   Essential hypertension  Principal Problem:   Thiamine deficiency -[name] is admitted per request from Hematology service, appreciate input by Dr. Jana [name] -Received first dose of thiamine in ER, plan to treat with 500 mg IV TID x 3 days, then 250 mg IV TID x 3 days, then switch to 100 mg po qd indefinitely per recommendation.    Active Problems:   Hypokalemia Remains low, will replace Recheck bmet in AM and continue to replace as needed    Metastatic malignant neoplasm  Has been followed by Oncology, appreciate in put by Dr. Jana [name]    Anemia, chronic disease Noted to be chronic and stable.  Plan to recheck cbc in AM    Essential hypertension Continue home dose of Cozaar as tolerated.  BP stable at this time  Dysphagia -Pt reports difficulty swallowing pills and food -Will consult SLP   DVT prophylaxis:  Lovenox subq Code Status: DNR Family Communication: Pt in room, family not at bedside  Status is: Inpatient  Remains inpatient appropriate because:IV treatments appropriate due to intensity of illness or inability to take PO  Dispo: The patient is from: SNF              Anticipated d/c is to: SNF              Patient currently is not medically stable to d/c.   Difficult to place patient No   Consultants:   Oncology  Procedures:     Antimicrobials: Anti-infectives (From admission, onward)   None      Subjective: Complaining of trouble swallowing  Objective: Vitals:   05/21/20 1441 05/21/20 2106 05/22/20 0503 05/22/20 1229  BP: 126/61 (!) 134/57 (!) 129/55 127/61  Pulse: 89 91 86 84  Resp: 20 20 20 18   Temp: 98.2 F (36.8 C) 98.3 F (36.8 C)  (!) 97.2 F (36.2 C)  TempSrc: Oral Oral    SpO2: 96% 95% 94% 97%  Weight:      Height:        Intake/Output Summary (Last 24 hours) at 05/22/2020 1504 Last data filed at 05/22/2020 0900 Gross per 24 hour  Intake 1923.4 ml  Output 651 ml  Net 1272.4 ml   Filed Weights   05/20/20 1932  Weight: 82.6 kg    Examination: General exam: Awake, laying in bed, in nad Respiratory system: Normal respiratory effort, no wheezing Cardiovascular system: regular rate, s1, s2 Gastrointestinal system: Soft, nondistended, positive BS Central  nervous system: CN2-12 grossly intact, strength intact Extremities: Perfused, no clubbing Skin: Normal skin turgor, no notable skin lesions seen Psychiatry: Mood normal // no visual hallucinations   Data Reviewed: I have personally reviewed following labs and imaging studies  CBC: Recent Labs  Lab 05/20/20 1955 05/21/20 0530 05/22/20 0401  WBC 6.3 6.1 5.6  NEUTROABS 5.4  --   --   HGB 8.7* 8.8* 8.3*  HCT 27.3* 27.6* 26.3*  MCV 96.1 96.8 98.1  PLT 197 197 371   Basic Metabolic Panel: Recent Labs  Lab 05/20/20 1955 05/21/20 0530 05/22/20 0401  NA 140 137 138  K 2.7* 2.7* 3.2*   CL 100 97* 102  CO2 27 26 25   GLUCOSE 82 90 86  BUN 7* 8 7*  CREATININE 0.53 0.49 0.54  CALCIUM 8.0* 7.6* 7.7*  MG 1.9  --   --    GFR: Estimated Creatinine Clearance: 76.3 mL/min (by C-G formula based on SCr of 0.54 mg/dL). Liver Function Tests: Recent Labs  Lab 05/22/20 0401  AST 47*  ALT 20  ALKPHOS 174*  BILITOT 1.6*  PROT 4.1*  ALBUMIN 2.2*   No results for input(s): LIPASE, AMYLASE in the last 168 hours. No results for input(s): AMMONIA in the last 168 hours. Coagulation Profile: No results for input(s): INR, PROTIME in the last 168 hours. Cardiac Enzymes: No results for input(s): CKTOTAL, CKMB, CKMBINDEX, TROPONINI in the last 168 hours. BNP (last 3 results) No results for input(s): PROBNP in the last 8760 hours. HbA1C: No results for input(s): HGBA1C in the last 72 hours. CBG: No results for input(s): GLUCAP in the last 168 hours. Lipid Profile: No results for input(s): CHOL, HDL, LDLCALC, TRIG, CHOLHDL, LDLDIRECT in the last 72 hours. Thyroid Function Tests: No results for input(s): TSH, T4TOTAL, FREET4, T3FREE, THYROIDAB in the last 72 hours. Anemia Panel: No results for input(s): VITAMINB12, FOLATE, FERRITIN, TIBC, IRON, RETICCTPCT in the last 72 hours. Sepsis Labs: No results for input(s): PROCALCITON, LATICACIDVEN in the last 168 hours.  Recent Results (from the past 240 hour(s))  SARS CORONAVIRUS 2 (TAT 6-24 HRS) Nasopharyngeal Nasopharyngeal Swab     Status: None   Collection Time: 05/20/20 10:01 PM   Specimen: Nasopharyngeal Swab  Result Value Ref Range Status   SARS Coronavirus 2 NEGATIVE NEGATIVE Final    Comment: (NOTE) SARS-CoV-2 target nucleic acids are NOT DETECTED.  The SARS-CoV-2 RNA is generally detectable in upper and lower respiratory specimens during the acute phase of infection. Negative results do not preclude SARS-CoV-2 infection, do not rule out co-infections with other pathogens, and should not be used as the sole basis for  treatment or other patient management decisions. Negative results must be combined with clinical observations, patient history, and epidemiological information. The expected result is Negative.  Fact Sheet for Patients: SugarRoll.be  Fact Sheet for Healthcare Providers: https://www.woods-mathews.com/  This test is not yet approved or cleared by the [place] FDA and  has been authorized for detection and/or diagnosis of SARS-CoV-2 by FDA under an Emergency Use Authorization (EUA). This EUA will remain  in effect (meaning this test can be used) for the duration of the COVID-19 declaration under Se ction 564(b)(1) of the Act, 21 U.S.C. section 360bbb-3(b)(1), unless the authorization is terminated or revoked sooner.  Performed at [facility], [place] [address] [place], [place] [address]      Radiology Studies: No results found.  Scheduled Meds: . anastrozole  1 mg Oral Daily  . Chlorhexidine Gluconate  Cloth  6 each Topical Daily  . dronabinol  2.5 mg Oral QAC lunch  . enoxaparin (LOVENOX) injection  40 mg Subcutaneous Q24H  . losartan  12.5 mg Oral Daily  . sodium chloride flush  10-40 mL Intracatheter Q12H  . thiamine  500 mg Intravenous Q8H  . thiamine  100 mg Oral Daily   Continuous Infusions: . sodium chloride 100 mL/hr at 05/21/20 1357  . potassium chloride 10 mEq (05/22/20 1442)  . thiamine injection 500 mg (05/22/20 1342)     LOS: 2 days   Marylu Lund, MD [facility] Pager On Amion  If 7PM-7AM, please contact night-coverage 05/22/2020, 3:04 PM

## 2020-05-22 NOTE — Progress Notes (Signed)
Brooke [name]   DOB:23-Sep-1950   QP#:[id number]   ZTI#:[id number]  Subjective:  Brooke [name] was working with therapy at the time my visit.  She reports that she does not feel well but does not elaborate on this.  She offers no specific complaints to me today.  She is worried about receiving a message from [name] about contacting them about holding her bed.  She states that she left a message for them but she is afraid that they will not receive it.  Objective: white woman examined in bed Vitals:   05/22/20 0503 05/22/20 1229  BP: (!) 129/55 127/61  Pulse: 86 84  Resp: 20 18  Temp:  (!) 97.2 F (36.2 C)  SpO2: 94% 97%    Body mass index is 26.89 kg/m.  Intake/Output Summary (Last 24 hours) at 05/22/2020 1612 Last data filed at 05/22/2020 0900 Gross per 24 hour  Intake 1923.4 ml  Output 651 ml  Net 1272.4 ml     Sclerae unicteric  Lungs no rales or wheezes--auscultated anterolaterally  Heart regular rate and rhythm  Abdomen soft, few BS  Neuro A&O x3, bilateral LE weakness as before (3/5)   CBG (last 3)  No results for input(s): GLUCAP in the last 72 hours.   Labs:  Lab Results  Component Value Date   WBC 5.6 05/22/2020   HGB 8.3 (L) 05/22/2020   HCT 26.3 (L) 05/22/2020   MCV 98.1 05/22/2020   PLT 187 05/22/2020   NEUTROABS 5.4 05/20/2020    _0 @  Urine Studies No results for input(s): UHGB, CRYS in the last 72 hours.  Invalid input(s): UACOL, UAPR, USPG, UPH, UTP, UGL, UKET, UBIL, UNIT, UROB, ULEU, UEPI, UWBC, URBC, UBAC, CAST, UCOM, BILUA  Basic Metabolic Panel: Recent Labs  Lab 05/20/20 1955 05/21/20 0530 05/22/20 0401  NA 140 137 138  K 2.7* 2.7* 3.2*  CL 100 97* 102  CO2 _1 GLUCOSE 82 90 86  BUN 7* 8 7*  CREATININE 0.53 0.49 0.54  CALCIUM 8.0* 7.6* 7.7*  MG 1.9  --   --    GFR Estimated Creatinine Clearance: 76.3 mL/min (by C-G formula based on SCr of 0.54 mg/dL). Liver Function Tests: Recent Labs  Lab 05/22/20 0401  AST 47*   ALT 20  ALKPHOS 174*  BILITOT 1.6*  PROT 4.1*  ALBUMIN 2.2*   No results for input(s): LIPASE, AMYLASE in the last 168 hours. No results for input(s): AMMONIA in the last 168 hours. Coagulation profile No results for input(s): INR, PROTIME in the last 168 hours.  CBC: Recent Labs  Lab 05/20/20 1955 05/21/20 0530 05/22/20 0401  WBC 6.3 6.1 5.6  NEUTROABS 5.4  --   --   HGB 8.7* 8.8* 8.3*  HCT 27.3* 27.6* 26.3*  MCV 96.1 96.8 98.1  PLT 197 197 187   Cardiac Enzymes: No results for input(s): CKTOTAL, CKMB, CKMBINDEX, TROPONINI in the last 168 hours. BNP: Invalid input(s): POCBNP CBG: No results for input(s): GLUCAP in the last 168 hours. D-Dimer No results for input(s): DDIMER in the last 72 hours. Hgb A1c No results for input(s): HGBA1C in the last 72 hours. Lipid Profile No results for input(s): CHOL, HDL, LDLCALC, TRIG, CHOLHDL, LDLDIRECT in the last 72 hours. Thyroid function studies No results for input(s): TSH, T4TOTAL, T3FREE, THYROIDAB in the last 72 hours.  Invalid input(s): FREET3 Anemia work up No results for input(s): VITAMINB12, FOLATE, FERRITIN, TIBC, IRON, RETICCTPCT in the last 72 hours. Microbiology Recent Results (from  the past 240 hour(s))  SARS CORONAVIRUS 2 (TAT 6-24 HRS) Nasopharyngeal Nasopharyngeal Swab     Status: None   Collection Time: 05/20/20 10:01 PM   Specimen: Nasopharyngeal Swab  Result Value Ref Range Status   SARS Coronavirus 2 NEGATIVE NEGATIVE Final    Comment: (NOTE) SARS-CoV-2 target nucleic acids are NOT DETECTED.  The SARS-CoV-2 RNA is generally detectable in upper and lower respiratory specimens during the acute phase of infection. Negative results do not preclude SARS-CoV-2 infection, do not rule out co-infections with other pathogens, and should not be used as the sole basis for treatment or other patient management decisions. Negative results must be combined with clinical observations, patient history, and  epidemiological information. The expected result is Negative.  Fact Sheet for Patients: SugarRoll.be  Fact Sheet for Healthcare Providers: https://www.woods-mathews.com/  This test is not yet approved or cleared by the [place] FDA and  has been authorized for detection and/or diagnosis of SARS-CoV-2 by FDA under an Emergency Use Authorization (EUA). This EUA will remain  in effect (meaning this test can be used) for the duration of the COVID-19 declaration under Se ction 564(b)(1) of the Act, 21 U.S.C. section 360bbb-3(b)(1), unless the authorization is terminated or revoked sooner.  Performed at [facility], [place] [address], [place], [place] [address]       Studies:  No results found.  Assessment: 70 y.o. [place] woman admitted for thiamine replacement, with a history of stage IV breast cancer as follows:  (1) status post bilateral breast biopsies 01/26/2017, showing: in the right breast, a complex sclerosing lesion, status post lumpectomy 09/25/2017, with no malignancy identified.  (2) in the left breast, a cT2 pN1 invasive ductal carcinoma (with some lobular features but E-cadherin positive), grade 2, estrogen and progesterone receptor positive, HER-2 not amplified, with an MIB-1 of 15-20%             (a) breast MRI 02/04/2017 suggests a T3 N1 tumor  (3) started neoadjuvant cyclophosphamide/docetaxel 03/03/2017, discontinued after 1 cycle with very poor tolerance             (a) started cyclophosphamide/methotrexate/fluorouracil (CMF) 03/28/2017, repeated x7 cycles, last dose 08/01/2017  (4) status post left modified radical mastectomy 09/25/17 for a ypT5 ypN2, stage IIIA invasive ductal carcinoma, grade 2, again estrogen and progesterone receptor positive, HER-2 not amplified  (5) adjuvant radiation completed 12/21/2017             (a) capecitabine sensitization tolerated only the initial week of radiation (b)  Site/dose:The patient initially received a dose of 50.4 Gy in 28 fractions to the leftchest wall andleftsupraclavicular region. This was delivered using a 3-D conformal, 4 field technique. The patient then received a boost to the mastectomy scar. This delivered an additional 10 Gy in 5 fractions using an en face electron field. The total dose was 60.4 Gy.  ( 6) tamoxifen started 03/09/2018, discontinued January 2022 with metastatic progression  (7) genetics testing 12/12/2017 through the Multi-Gene Panel offered by [facility] found no deleterious mutations in AIP, ALK, APC, ATM, AXIN2,BAP1,  BARD1, BLM, BMPR1A, BRCA1, BRCA2, BRIP1, CASR, CDC73, CDH1, CDK4, CDKN1B, CDKN1C, CDKN2A (p14ARF), CDKN2A (p16INK4a), CEBPA, CHEK2, CTNNA1, DICER1, DIS3L2, EGFR (c.2369C>T, p.Thr790Met variant only), EPCAM (Deletion/duplication testing only), FH, FLCN, GATA2, GPC3, GREM1 (Promoter region deletion/duplication testing only), HOXB13 (c.251G>A, p.Gly84Glu), HRAS, KIT, MAX, MEN1, MET, MITF (c.952G>A, p.Glu318Lys variant only), MLH1, MSH2, MSH3, MSH6, MUTYH, NBN, NF1, NF2, NTHL1, PALB2, PDGFRA, PHOX2B, PMS2, POLD1, POLE, POT1, PRKAR1A, PTCH1, PTEN, RAD50,  RAD51C, RAD51D, RB1, RECQL4, RET, RUNX1, SDHAF2, SDHA (sequence changes only), SDHB, SDHC, SDHD, SMAD4, SMARCA4, SMARCB1, SMARCE1, STK11, SUFU, TERC, TERT, TMEM127, TP53, TSC1, TSC2, VHL, WRN and WT1.   (8) Foundation 1 testing shows stable microsatellite status and 1 mutation/ Mb, TP53 mutations x2             (a) PD-L1 dated July 2019 negative  METASTATIC DISEASE: January 2022 (9) CT angio of the chest 02/27/2020 shows multiple hilar nodes consistent with prior granulomatous infection, multiple small pulmonary nodules, multiple punctate granulomas throughout the liver and spleen, nodular hepatic contour and areas of mottling possibly due to steatosis, 1.6 cm left adrenal nodule             (a) PET scan 03/05/2020 shows mildly to highly hypermetabolic adenopathy, the  pulmonary nodules being mildly to not hypermetabolic, a large right hepatic liver lesion with an SUV of 13.2 and additional hypermetabolic right and left lobe foci, the adrenal lesion being hypermetabolic with an SUV of 16.  There are also peritoneal nodules and innumerable foci of bony involvement.  Incidental note was made of small heterogeneous foci of increased uptake in the cerebellum.  The CT images did not show gross abnormality in the visualized brain             (b) on 03/09/2020 CA 27-29 was 983.0, CEA 744.17, the CA 19-9 normal at 32             (c) liver biopsy 03/13/2020 confirms metastati carcinoma, estrogen receptor positive, progesterone and HER 2 negative, with an Mib-1 of 40%             (d) brain MRI 03/26/2020 shows dural involvement including a 1.0 cm dural-based cerebellar lesion  (10) paclitaxel started 03/30/2020, repeated x3, last dose 04/30/2020 w/o evidence of response  (11) zoledronate started 03/19/2020, to be repeated every 12 weeks (next dose 06/11/2020)  (12)anastrozolestarted 05/07/2020 (a) palbociclib 125 mg daily, 21 days off, 7 days off, started 05/17/2018  Plan:  Brooke [name] continues on thiamine and potassium replacement. Plan is for her to [facility] to [facility] after 9 doses of IV  thiamine and that means she could potentially be discharged Sat 03/26 PM or Sun AM.  Recommend continuation of oral thiamine which should be continued as an outpatient.  She is tolerating the anastrozole + palbociclib w/o any unusual side effects. She has a follow-up at the cancer center 06/11/2020 and is aware.  I have placed a TOC consult today to assist the patient with contacting [name] regarding holding her bed.  Mikey Bussing, NP 05/22/2020  4:12 PM

## 2020-05-22 NOTE — Progress Notes (Signed)
Pt alert and aware sitting up in bed. Pt states she has a lot going on but would not go into details about it. She says she has friends of faith and family that she can lean on. The chaplain offered caring and supportive presence. He sang 'On [employer]'. Pt thank me for stopping by. Further follow-up will be offered.

## 2020-05-23 ENCOUNTER — Other Ambulatory Visit: Payer: Self-pay

## 2020-05-23 DIAGNOSIS — D638 Anemia in other chronic diseases classified elsewhere: Secondary | ICD-10-CM

## 2020-05-23 DIAGNOSIS — C799 Secondary malignant neoplasm of unspecified site: Secondary | ICD-10-CM | POA: Diagnosis not present

## 2020-05-23 DIAGNOSIS — Z7189 Other specified counseling: Secondary | ICD-10-CM

## 2020-05-23 DIAGNOSIS — I10 Essential (primary) hypertension: Secondary | ICD-10-CM | POA: Diagnosis not present

## 2020-05-23 DIAGNOSIS — E519 Thiamine deficiency, unspecified: Secondary | ICD-10-CM | POA: Diagnosis not present

## 2020-05-23 LAB — COMPREHENSIVE METABOLIC PANEL
ALT: 18 U/L (ref 0–44)
AST: 48 U/L — ABNORMAL HIGH (ref 15–41)
Albumin: 2.2 g/dL — ABNORMAL LOW (ref 3.5–5.0)
Alkaline Phosphatase: 174 U/L — ABNORMAL HIGH (ref 38–126)
Anion gap: 10 (ref 5–15)
BUN: 7 mg/dL — ABNORMAL LOW (ref 8–23)
CO2: 23 mmol/L (ref 22–32)
Calcium: 7.8 mg/dL — ABNORMAL LOW (ref 8.9–10.3)
Chloride: 103 mmol/L (ref 98–111)
Creatinine, Ser: 0.48 mg/dL (ref 0.44–1.00)
GFR, Estimated: >60 mL/min (ref >60)
Glucose, Bld: 85 mg/dL (ref 70–99)
Potassium: 3.6 mmol/L (ref 3.5–5.1)
Sodium: 136 mmol/L (ref 135–145)
Total Bilirubin: 1.9 mg/dL — ABNORMAL HIGH (ref 0.3–1.2)
Total Protein: 4.1 g/dL — ABNORMAL LOW (ref 6.5–8.1)

## 2020-05-23 LAB — MAGNESIUM: Magnesium: 1.9 mg/dL (ref 1.7–2.4)

## 2020-05-23 LAB — SARS CORONAVIRUS 2 (TAT 6-24 HRS): SARS Coronavirus 2: NEGATIVE

## 2020-05-23 MED ORDER — POTASSIUM CHLORIDE 10 MEQ/100ML IV SOLN
10.0000 meq | INTRAVENOUS | Status: AC
  Administered 2020-05-23 (×4): 10 meq via INTRAVENOUS
  Filled 2020-05-23 (×4): qty 100

## 2020-05-23 NOTE — NC FL2 (Signed)
[place] LEVEL OF CARE SCREENING TOOL     IDENTIFICATION  Patient Name: Brooke [name] Birthdate: 11-Dec-1950 Sex: female Admission Date (Current Location): 05/20/2020  [facility] and [place] Number:  Herbalist and Address:  [facility],  [place], [place]      Provider Number: [id number]  Attending Physician Name and Address:  Donne Hazel, MD  Relative Name and Phone Number:       Current Level of Care: Hospital Recommended Level of Care: [place] Prior Approval Number:    Date Approved/Denied:   PASRR Number:    Discharge Plan: SNF    Current Diagnoses: Patient Active Problem List   Diagnosis Date Noted  . Thiamine deficiency 05/20/2020  . Hypokalemia 05/20/2020  . Anemia, chronic disease 05/20/2020  . Essential hypertension 05/20/2020  . B12 deficiency anemia 05/11/2020  . Pressure injury of skin 05/08/2020  . Malnutrition of moderate degree 05/05/2020  . Intractable vomiting with nausea 05/03/2020  . Acute lower UTI 04/21/2020  . Dehydration 04/21/2020  . Depression with anxiety 04/21/2020  . Transaminitis 04/21/2020  . Metastatic malignant neoplasm ([place])   . Nausea vomiting and diarrhea   . Intractable nausea and vomiting 04/20/2020  . PICC (peripherally inserted central catheter) in place 04/17/2020  . Bone metastases ([place]) 03/11/2020  . Liver metastases ([place]) 03/05/2020  . Lung metastases ([place]) 03/05/2020  . Postoperative state 04/23/2019  . Superficial basal cell carcinoma (BCC) 02/10/2019  . Goals of care, counseling/discussion 12/14/2017  . Family history of uterine cancer   . Family history of prostate cancer   . Melanoma in situ of right upper extremity ([place]) 07/18/2017  . Port-A-Cath in place 04/18/2017  . Drug-induced neutropenia ([place]) 03/09/2017  . Family history of breast cancer 02/02/2017  . Family history of colon cancer 02/02/2017  . Family history of prostate  cancer in father 02/02/2017  . Malignant neoplasm of overlapping sites of left breast in female, estrogen receptor positive ([place]) 01/31/2017  . Hx of hot flashes, menopausal, HRT in the past 12/19/2011  . Osteopenia, stable, DEXA 2013, recs to repeat in 2018 12/19/2011  . Hx of Hyperlipidemia, LDL 144 in 07/2011 12/19/2011  . Vitamin D insufficiency 12/19/2011  . Fibroid     Orientation RESPIRATION BLADDER Height & Weight     Self,Time,Situation,Place  Normal Continent Weight: 182 lb 1.6 oz (82.6 kg) Height:  5\' 9"  (175.3 cm)  BEHAVIORAL SYMPTOMS/MOOD NEUROLOGICAL BOWEL NUTRITION STATUS      Continent Diet (Regular diet)  AMBULATORY STATUS COMMUNICATION OF NEEDS Skin   Limited Assist Verbally                         Personal Care Assistance Level of Assistance  Bathing,Dressing Bathing Assistance: Limited assistance   Dressing Assistance: Limited assistance     Functional Limitations Info  Sight Sight Info: Impaired        SPECIAL CARE FACTORS FREQUENCY  PT (By licensed PT),OT (By licensed OT)     PT Frequency: 5x weekly OT Frequency: 5x weekly            Contractures Contractures Info: Not present    Additional Factors Info  Code Status,Allergies Code Status Info: DNR Allergies Info: Neosporin (Neomycin-bacitracin Zn-polymyx), Bactrim (Sulfamethoxazole-trimethoprim), Benzalkonium Chloride, Latex           Current Medications (05/23/2020):  This is the current hospital active medication list Current Facility-Administered Medications  Medication Dose  Route Frequency Provider Last Rate Last Admin  . 0.9 %  sodium chloride infusion   Intravenous Continuous Chotiner, Yevonne Aline, MD 100 mL/hr at 05/23/20 0300 Infusion Verify at 05/23/20 0300  . acetaminophen (TYLENOL) tablet 650 mg  650 mg Oral Q6H PRN Chotiner, Yevonne Aline, MD       Or  . acetaminophen (TYLENOL) suppository 650 mg  650 mg Rectal Q6H PRN Chotiner, Yevonne Aline, MD      . anastrozole (ARIMIDEX)  tablet 1 mg  1 mg Oral Daily Chotiner, Yevonne Aline, MD   1 mg at 05/23/20 1003  . Chlorhexidine Gluconate Cloth 2 % PADS 6 each  6 each Topical Daily Chotiner, Yevonne Aline, MD   6 each at 05/23/20 1008  . dronabinol (MARINOL) capsule 2.5 mg  2.5 mg Oral QAC lunch Chotiner, Yevonne Aline, MD   2.5 mg at 05/22/20 1243  . enoxaparin (LOVENOX) injection 40 mg  40 mg Subcutaneous Q24H Chotiner, Yevonne Aline, MD   40 mg at 05/23/20 1003  . losartan (COZAAR) tablet 12.5 mg  12.5 mg Oral Daily Chotiner, Yevonne Aline, MD   12.5 mg at 05/23/20 0955  . multivitamin liquid 15 mL  15 mL Oral Daily [place], Deupree, RPH      . ondansetron [facility]) tablet 4 mg  4 mg Oral Q6H PRN Chotiner, Yevonne Aline, MD       Or  . ondansetron (ZOFRAN) injection 4 mg  4 mg Intravenous Q6H PRN Chotiner, Yevonne Aline, MD      . potassium chloride 10 mEq in 100 mL IVPB  10 mEq Intravenous Q1 Hr x 4 Donne Hazel, MD 100 mL/hr at 05/23/20 1002 10 mEq at 05/23/20 1002  . prochlorperazine (COMPAZINE) tablet 10 mg  10 mg Oral Q6H PRN Chotiner, Yevonne Aline, MD      . sodium chloride flush (NS) 0.9 % injection 10-40 mL  10-40 mL Intracatheter Q12H Chotiner, Yevonne Aline, MD      . sodium chloride flush (NS) 0.9 % injection 10-40 mL  10-40 mL Intracatheter PRN Chotiner, Yevonne Aline, MD      . thiamine 500mg  in normal saline (19ml) IVPB  500 mg Intravenous Q8H Donne Hazel, MD 100 mL/hr at 05/23/20 0420 500 mg at 05/23/20 0420  . thiamine tablet 100 mg  100 mg Oral Daily Magrinat, Virgie Dad, MD   100 mg at 05/23/20 4098     Discharge Medications: Please see discharge summary for a list of discharge medications.  Relevant Imaging Results:  Relevant Lab Results:   Additional Information covid vaccinated SS# [id number]  Ludwig Clarks, LCSW

## 2020-05-23 NOTE — Progress Notes (Signed)
PROGRESS NOTE    Brooke [name]  FAO:[id number] DOB: February 19, 1951 DOA: 05/20/2020 PCP: [name], MD    Brief Narrative:  70 y.o. female with medical history significant for HTN, metastatic breast cancer. She has been receiving chemotherapy and is followed by Dr. [name]. Patient has had progressive weakness is currently residing at a nursing facility. Patient had outpatient laboratory tests on March 13 and results reported yesterday and she was noted to have undectable vitamin B1 levels. Patient's B12 level is also decreased. Patient was called with the result and instructed to come to the ED because she needs IV replacement therapy. Dr. [name] called the ED and stated the patient needs 500 mg of IV thiamine 3 times daily for the next 3 days. She was also reportedly seen by neurology recently for intermittent confusion and neurology recommended the IV thiamine replacement protocol  Assessment & Plan:   Principal Problem:   Thiamine deficiency Active Problems:   Metastatic malignant neoplasm (HCC)   Hypokalemia   Anemia, chronic disease   Essential hypertension  Principal Problem:   Thiamine deficiency with weakness -[name] is admitted per request from Hematology service, appreciate input by Dr. [name] -Presented with markedly low thiamine level, requiring IV thiamine course, now  transitioned to PO thiamine to complete replenishment -Appreciate input by PT, recommendation for return to SNF   Active Problems:   Hypokalemia Improved but continues to be low, will replace Recheck bmet in AM and continue to replace as needed    Metastatic malignant neoplasm  Has been followed by Oncology, appreciate in put by Dr. [name]    Anemia, chronic disease Noted to be chronic and stable.  Plan to recheck cbc in AM    Essential hypertension Continue home dose of Cozaar as tolerated.   -BP remains stable  Dysphagia -Pt reports difficulty swallowing pills and  food -Will consult SLP   DVT prophylaxis: Lovenox subq Code Status: DNR Family Communication: Pt in room, family not at bedside  Status is: Inpatient  Remains inpatient appropriate because:IV treatments appropriate due to intensity of illness or inability to take PO  Dispo: The patient is from: SNF              Anticipated d/c is to: SNF              Patient currently is not medically stable to d/c.   Difficult to place patient No   Consultants:   Oncology  Procedures:     Antimicrobials: Anti-infectives (From admission, onward)   None      Subjective: Reports feeling somewhat better since receiving IV thiamine, still weak  Objective: Vitals:   05/22/20 1229 05/22/20 2132 05/23/20 0627 05/23/20 1327  BP: 127/61 129/60 128/61 138/67  Pulse: 84 91 94 89  Resp: 18  18 18   Temp: (!) 97.2 F (36.2 C) 98.8 F (37.1 C) 98.4 F (36.9 C) 98.4 F (36.9 C)  TempSrc:  Oral Oral Oral  SpO2: 97% 95% 90% 97%  Weight:      Height:        Intake/Output Summary (Last 24 hours) at 05/23/2020 1655 Last data filed at 05/23/2020 0636 Gross per 24 hour  Intake 2771.6 ml  Output 550 ml  Net 2221.6 ml   Filed Weights   05/20/20 1932  Weight: 82.6 kg    Examination: General exam: Conversant, in no acute distress Respiratory system: normal chest rise, clear, no audible wheezing Cardiovascular system: regular rhythm, s1-s2 Gastrointestinal system: Nondistended, nontender,  pos BS Central nervous system: No seizures, no tremors Extremities: No cyanosis, no joint deformities Skin: No rashes, no pallor Psychiatry: Affect normal // no auditory hallucinations   Data Reviewed: I have personally reviewed following labs and imaging studies  CBC: Recent Labs  Lab 05/20/20 1955 05/21/20 0530 05/22/20 0401  WBC 6.3 6.1 5.6  NEUTROABS 5.4  --   --   HGB 8.7* 8.8* 8.3*  HCT 27.3* 27.6* 26.3*  MCV 96.1 96.8 98.1  PLT 197 197 702   Basic Metabolic Panel: Recent Labs  Lab  05/20/20 1955 05/21/20 0530 05/22/20 0401 05/23/20 0433  NA 140 137 138 136  K 2.7* 2.7* 3.2* 3.6  CL 100 97* 102 103  CO2 27 26 25 23   GLUCOSE 82 90 86 85  BUN 7* 8 7* 7*  CREATININE 0.53 0.49 0.54 0.48  CALCIUM 8.0* 7.6* 7.7* 7.8*  MG 1.9  --   --  1.9   GFR: Estimated Creatinine Clearance: 76.3 mL/min (by C-G formula based on SCr of 0.48 mg/dL). Liver Function Tests: Recent Labs  Lab 05/22/20 0401 05/23/20 0433  AST 47* 48*  ALT 20 18  ALKPHOS 174* 174*  BILITOT 1.6* 1.9*  PROT 4.1* 4.1*  ALBUMIN 2.2* 2.2*   No results for input(s): LIPASE, AMYLASE in the last 168 hours. No results for input(s): AMMONIA in the last 168 hours. Coagulation Profile: No results for input(s): INR, PROTIME in the last 168 hours. Cardiac Enzymes: No results for input(s): CKTOTAL, CKMB, CKMBINDEX, TROPONINI in the last 168 hours. BNP (last 3 results) No results for input(s): PROBNP in the last 8760 hours. HbA1C: No results for input(s): HGBA1C in the last 72 hours. CBG: No results for input(s): GLUCAP in the last 168 hours. Lipid Profile: No results for input(s): CHOL, HDL, LDLCALC, TRIG, CHOLHDL, LDLDIRECT in the last 72 hours. Thyroid Function Tests: No results for input(s): TSH, T4TOTAL, FREET4, T3FREE, THYROIDAB in the last 72 hours. Anemia Panel: No results for input(s): VITAMINB12, FOLATE, FERRITIN, TIBC, IRON, RETICCTPCT in the last 72 hours. Sepsis Labs: No results for input(s): PROCALCITON, LATICACIDVEN in the last 168 hours.  Recent Results (from the past 240 hour(s))  SARS CORONAVIRUS 2 (TAT 6-24 HRS) Nasopharyngeal Nasopharyngeal Swab     Status: None   Collection Time: 05/20/20 10:01 PM   Specimen: Nasopharyngeal Swab  Result Value Ref Range Status   SARS Coronavirus 2 NEGATIVE NEGATIVE Final    Comment: (NOTE) SARS-CoV-2 target nucleic acids are NOT DETECTED.  The SARS-CoV-2 RNA is generally detectable in upper and lower respiratory specimens during the acute phase  of infection. Negative results do not preclude SARS-CoV-2 infection, do not rule out co-infections with other pathogens, and should not be used as the sole basis for treatment or other patient management decisions. Negative results must be combined with clinical observations, patient history, and epidemiological information. The expected result is Negative.  Fact Sheet for Patients: SugarRoll.be  Fact Sheet for Healthcare Providers: https://www.woods-mathews.com/  This test is not yet approved or cleared by the [place] FDA and  has been authorized for detection and/or diagnosis of SARS-CoV-2 by FDA under an Emergency Use Authorization (EUA). This EUA will remain  in effect (meaning this test can be used) for the duration of the COVID-19 declaration under Se ction 564(b)(1) of the Act, 21 U.S.C. section 360bbb-3(b)(1), unless the authorization is terminated or revoked sooner.  Performed at [facility], [place] [address] [place], [place] [address]  Radiology Studies: No results found.  Scheduled Meds: . anastrozole  1 mg Oral Daily  . Chlorhexidine Gluconate Cloth  6 each Topical Daily  . dronabinol  2.5 mg Oral QAC lunch  . enoxaparin (LOVENOX) injection  40 mg Subcutaneous Q24H  . losartan  12.5 mg Oral Daily  . multivitamin  15 mL Oral Daily  . sodium chloride flush  10-40 mL Intracatheter Q12H  . thiamine  100 mg Oral Daily   Continuous Infusions: . sodium chloride 100 mL/hr at 05/23/20 1539     LOS: 3 days   Marylu Lund, MD [facility] Pager On Amion  If 7PM-7AM, please contact night-coverage 05/23/2020, 4:55 PM

## 2020-05-23 NOTE — Progress Notes (Signed)
Patient friend here with her needing to get POA info. Told patient friend to request chaplain on Monday and will be able to complete AD info.   05/23/20 1600  Clinical Encounter Type  Visited With Patient not available  Visit Type Initial  Referral From Nurse  Consult/Referral To Chaplain  Spiritual Encounters  Spiritual Needs Literature

## 2020-05-23 NOTE — Evaluation (Signed)
Clinical/Bedside Swallow Evaluation Patient Details  Name: Brooke [name] MRN: [id number] Date of Birth: [birth year]  Today's Date: 05/23/2020 Time: SLP Start Time (ACUTE ONLY): 1115 SLP Stop Time (ACUTE ONLY): 1140 SLP Time Calculation (min) (ACUTE ONLY): 25 min  Past Medical History:  Past Medical History:  Diagnosis Date  . Anemia   . Anxiety   . Arthritis   . ASCUS of cervix with negative high risk HPV 12/2018  . Bilateral dry eyes   . Breast cancer, left breast ([place])   . Family history of breast cancer 02/02/2017  . Family history of breast cancer   . Family history of colon cancer 02/02/2017  . Family history of colon cancer   . Family history of prostate cancer   . Family history of prostate cancer in father 02/02/2017  . Family history of uterine cancer   . Fibroid   . History of kidney stones   . Hyperlipidemia   . Hypertension   . Melanoma of upper arm ([place]) 2009   RIGHT ARM   . Obesity (BMI 30.0-34.9)   . Osteopenia 09/2017   T score -1.9 max 8% / 0.8% statistically significant decline at right and left hip stable at the spine  . PONV (postoperative nausea and vomiting)   . Squamous carcinoma     of the skin  . UTI (urinary tract infection)   . Varicose veins of both lower extremities   . Vitamin D deficiency 07/2009   LOW VITAMIN D 29  . Vitamin deficiency 07/2008   VITAMIN D LOW 23   Past Surgical History:  Past Surgical History:  Procedure Laterality Date  . ABDOMINAL SURGERY  1975   ovairan cyst   . APPENDECTOMY    . BREAST LUMPECTOMY WITH RADIOACTIVE SEED LOCALIZATION Right 09/25/2017   Procedure: RIGHT BREAST LUMPECTOMY WITH RADIOACTIVE SEED LOCALIZATION;  Surgeon: Jovita Kussmaul, MD;  Location: [facility];  Service: General;  Laterality: Right;  . BREAST SURGERY     bio  . CHOLECYSTECTOMY    . DERMOID CYST  EXCISION    . DILATATION & CURETTAGE/HYSTEROSCOPY WITH MYOSURE N/A 04/23/2019   Procedure: DILATATION & CURETTAGE/HYSTEROSCOPY WITH MYOSURE;  Surgeon:  [name], MD;  Location: [place];  Service: Gynecology;  Laterality: N/A;  request to follow at 9:30am in [facility] block requests 45 minutes  . DILATION AND CURETTAGE OF UTERUS  1998  . GALLBLADDER SURGERY    . HYSTEROSCOPY    . insertion  port a cath  03/02/2016  . MASTECTOMY MODIFIED RADICAL Left 09/25/2017   Procedure: LEFT MODIFIED RADICAL MASTECTOMY;  Surgeon: Jovita Kussmaul, MD;  Location: [place];  Service: General;  Laterality: Left;  . PORTACATH PLACEMENT N/A 03/02/2017   Procedure: INSERTION PORT-A-CATH;  Surgeon: Jovita Kussmaul, MD;  Location: [place];  Service: General;  Laterality: N/A;  . SKIN BIOPSY    . SKIN CANCER EXCISION  2010,2011   2010-RIGHT ARM, 2011-LEFT LOWER LEG.-DR Rolm Bookbinder DERM. DR. Sarajane Jews IS HER DERM SURGEON.   HPI:  70 y.o. female with medical history significant for HTN, metastatic breast cancer admitted to [facility] on 05/20/20.   She has been receiving chemotherapy and is followed by Dr. [name].  Patient has had progressive weakness and is currently residing at a nursing facility.  Patient had outpatient laboratory tests on March 13 and results reported on 05/19/20 and she was noted to have undectable vitamin B1 levels.  Patient's B12 level is also decreased.  Patient was called with the result and instructed to come to the ED because she needs IV replacement therapy.  Dr. [name] called the ED and stated the patient needs 500 mg of IV thiamine 3 times daily for the next 3 days. She was also reportedly seen by neurology recently for intermittent confusion and neurology recommended the IV thiamine replacement protocol. Patient denies any other acute issues. She is not having any nausea or vomiting at this time. BSE ordered.  Assessment / Plan / Recommendation Clinical Impression  Pt assessed using clinical swallowing evaluation with results only noted for thin liquids d/t pt declining other consistencies d/t decreased  satiety (d/t new dx of cancer) and globus sensation/gagging during intake.  Pt has had significant weight loss over the last 6 months and has limited PO intake.  Pt encouraged to consume small amounts of other consistencies, but declined at this time.  Thin liquids appear to have transitioned well with small/larger boluses with some grimacing noted during intake, but no overt s/s of aspiration observed.  Recommend ST f/u for completion of BSE as able and determination of full swallowing capability while in acute setting.  Continue regular/thin liquid diet.   SLP Visit Diagnosis: Dysphagia, unspecified (R13.10)    Aspiration Risk  Risk for inadequate nutrition/hydration    Diet Recommendation   Regular/thin liquids   Medication Administration: Whole meds with liquid    Other  Recommendations Oral Care Recommendations: Oral care BID   Follow up Recommendations None      Frequency and Duration min 1 x/week  1 week       Prognosis Prognosis for Safe Diet Advancement: Good Barriers to Reach Goals: Motivation      Swallow Study   General Date of Onset: 05/20/20 HPI: 70 y.o. female with medical history significant for HTN, metastatic breast cancer.  She has been receiving chemotherapy and is followed by Dr. [name].  Patient has had progressive weakness is currently residing at a nursing facility.  Patient had outpatient laboratory tests on March 13 and results reported yesterday and she was noted to have undectable vitamin B1 levels.  Patient's B12 level is also decreased.  Patient was called with the result and instructed to come to the ED because she needs IV replacement therapy.  Dr. [name] called the ED and stated the patient needs 500 mg of IV thiamine 3 times daily for the next 3 days. She was also reportedly seen by neurology recently for intermittent confusion and neurology recommended the IV thiamine replacement protocol. Patient denies any other acute issues. She is not having any  nausea or vomiting at this time. Type of Study: Bedside Swallow Evaluation Previous Swallow Assessment: 04/23/20 esophagram indicated Small sliding-type hiatal hernia. No definite mass or stricture is  noted in the esophagus Diet Prior to this Study: Regular;Thin liquids Temperature Spikes Noted: No Respiratory Status: Nasal cannula (2L) History of Recent Intubation: No Behavior/Cognition: Alert;Cooperative;Other (Comment) (needs encouragement d/t recent dx) Oral Cavity Assessment: Dry Oral Care Completed by SLP: No Oral Cavity - Dentition: Adequate natural dentition Vision: Functional for self-feeding Self-Feeding Abilities: Able to feed self Patient Positioning: Upright in bed Baseline Vocal Quality: Normal Volitional Cough: Strong Volitional Swallow: Able to elicit    Oral/Motor/Sensory Function Overall Oral Motor/Sensory Function: Within functional limits   Ice Chips Ice chips: Not tested   Thin Liquid Thin Liquid: Within functional limits Presentation: Straw;Self Fed Pharyngeal  Phase Impairments: Other (comments) (grimacing intermittently during swallow attempts) Other Comments: stated "thin liquids are  okay to swallow, but other things make her gag"/globus sensation noted    Nectar Thick Nectar Thick Liquid: Not tested   Honey Thick Honey Thick Liquid: Not tested   Puree Puree: Not tested (Pt declined)   Solid     Solid: Not tested (Pt declined)      Elvina Sidle, M.S., CCC-SLP 05/23/2020,2:48 PM

## 2020-05-23 NOTE — Progress Notes (Signed)
Physical Therapy Treatment Patient Details Name: Brooke [name] MRN: [id number] DOB: 09/12/1950 Today's Date: 05/23/2020    History of Present Illness Pt is 70 y.o. female admitted on 05/20/20 with thiamine deficiency and hypokalemia.  Pt has been receiving chemotherapy. She had recent hospital admission and went to SNF for therapy.  Pt with medical history significant for HTN, metastatic breast cancer.    PT Comments    Pt with gradual progress.  She continues to have pain and profound weakness that limit but was agreeable to therapy. Pt fatigued easily but participated with exercises with cues.  Required mod A to transfer to EOB but was unable to tolerate for more than a minute due to pain in buttock and legs.  Pt also expressed frustration about lack of schedule for therapy.  For future treatments, pt may benefit from pain medication prior to therapy and scheduled/planned time for session (even if just plan that morning).     Follow Up Recommendations  SNF     Equipment Recommendations  Wheelchair cushion (measurements PT);Wheelchair (measurements PT);3in1 (PT);Hospital bed (hoyer lift)    Recommendations for Other Services       Precautions / Restrictions Precautions Precautions: Fall    Mobility  Bed Mobility Overal bed mobility: Needs Assistance Bed Mobility: Supine to Sit;Sit to Supine Rolling: Mod assist   Supine to sit: Mod assist Sit to supine: Mod assist   General bed mobility comments: Required cues for sequencing and technique for all.  Rolling: educated on bending opposite leg, reaching across, and then mod A to complete.  Supine to sit: Pt assisted with sliding legs towards EOB (with cues and assist) then required mod A to lift trunk to sit and bed pad to scoot forward.  Sit to Supine: attempted to cue for sidelying to supine to hopefully decrease pain but pt reports unable and required mod A to return legs to bed.  Repositioning: positioned in trendelenberg and pt  able to assist with arm and mod A from therapist to scoot up in bed.    Transfers                 General transfer comment: Unable due to pain and weakness  Ambulation/Gait                 Stairs             Wheelchair Mobility    Modified Rankin (Stroke Patients Only)       Balance Overall balance assessment: Needs assistance Sitting-balance support: Feet supported;No upper extremity supported Sitting balance-Leahy Scale: Fair Sitting balance - Comments: Able to sit at EOB without support but only for about 1 minute - limited by pain                                    Cognition Arousal/Alertness: Awake/alert Behavior During Therapy: Anxious Overall Cognitive Status: Within Functional Limits for tasks assessed                                 General Comments: Pt initially anxious and expressed frustration about not having scheduled session, but agreed to therapy and later apologized about getting upset.  Discussed would make note on therapy log to schedule a time.      Exercises General Exercises - Lower Extremity Ankle Circles/Pumps: AROM;Both;10 reps;Supine Quad Sets: AROM;Both;10  reps;Supine Short Arc Quad: AAROM;Both;10 reps;Supine Heel Slides: AAROM;Both;10 reps;Supine Other Exercises Other Exercises: All exercises: cues for correct form and adjusted bed position to decrease buttock pain.  Encouraged pt to perform what she could on her own throughout the day - even if not getting full ROM    General Comments        Pertinent Vitals/Pain Pain Assessment: Faces Faces Pain Scale: Hurts whole lot Pain Location: bil LEs and buttock with movement Pain Descriptors / Indicators: Grimacing;Moaning Pain Intervention(s): Limited activity within patient's tolerance;Monitored during session;Repositioned;Relaxation    Home Living                      Prior Function            PT Goals (current goals can now  be found in the care plan section) Acute Rehab PT Goals Patient Stated Goal: decrease pain PT Goal Formulation: With patient Time For Goal Achievement: 06/12/20 Potential to Achieve Goals: Fair Progress towards PT goals: Progressing toward goals    Frequency    Min 2X/week      PT Plan Current plan remains appropriate    Co-evaluation              AM-PAC PT "6 Clicks" Mobility   Outcome Measure  Help needed turning from your back to your side while in a flat bed without using bedrails?: A Lot Help needed moving from lying on your back to sitting on the side of a flat bed without using bedrails?: A Lot Help needed moving to and from a bed to a chair (including a wheelchair)?: Total Help needed standing up from a chair using your arms (e.g., wheelchair or bedside chair)?: Total Help needed to walk in hospital room?: Total Help needed climbing 3-5 steps with a railing? : Total 6 Click Score: 8    End of Session   Activity Tolerance: Patient limited by pain Patient left: in bed;with call bell/phone within reach;with bed alarm set Nurse Communication: Mobility status PT Visit Diagnosis: Difficulty in walking, not elsewhere classified (R26.2);Muscle weakness (generalized) (M62.81)     Time: 5573-2202 PT Time Calculation (min) (ACUTE ONLY): 18 min  Charges:  $Therapeutic Activity: 8-22 mins                     Abran Richard, PT Acute Rehab Services Pager [phone] [facility] [place] 05/23/2020, 4:34 PM

## 2020-05-24 DIAGNOSIS — I10 Essential (primary) hypertension: Secondary | ICD-10-CM | POA: Diagnosis not present

## 2020-05-24 DIAGNOSIS — Z7189 Other specified counseling: Secondary | ICD-10-CM | POA: Diagnosis not present

## 2020-05-24 DIAGNOSIS — Z515 Encounter for palliative care: Secondary | ICD-10-CM | POA: Diagnosis not present

## 2020-05-24 DIAGNOSIS — C799 Secondary malignant neoplasm of unspecified site: Secondary | ICD-10-CM | POA: Diagnosis not present

## 2020-05-24 DIAGNOSIS — E519 Thiamine deficiency, unspecified: Secondary | ICD-10-CM | POA: Diagnosis not present

## 2020-05-24 LAB — COMPREHENSIVE METABOLIC PANEL
ALT: 17 U/L (ref 0–44)
AST: 49 U/L — ABNORMAL HIGH (ref 15–41)
Albumin: 2.2 g/dL — ABNORMAL LOW (ref 3.5–5.0)
Alkaline Phosphatase: 182 U/L — ABNORMAL HIGH (ref 38–126)
Anion gap: 11 (ref 5–15)
BUN: 6 mg/dL — ABNORMAL LOW (ref 8–23)
CO2: 22 mmol/L (ref 22–32)
Calcium: 7.9 mg/dL — ABNORMAL LOW (ref 8.9–10.3)
Chloride: 104 mmol/L (ref 98–111)
Creatinine, Ser: 0.49 mg/dL (ref 0.44–1.00)
GFR, Estimated: >60 mL/min (ref >60)
Glucose, Bld: 78 mg/dL (ref 70–99)
Potassium: 3.9 mmol/L (ref 3.5–5.1)
Sodium: 137 mmol/L (ref 135–145)
Total Bilirubin: 2 mg/dL — ABNORMAL HIGH (ref 0.3–1.2)
Total Protein: 4.1 g/dL — ABNORMAL LOW (ref 6.5–8.1)

## 2020-05-24 NOTE — Consult Note (Signed)
Consultation Note Date: 05/24/2020   Patient Name: Brooke [name]  DOB: 02-Jan-1951  MRN: [id number]  Age / Sex: 70 y.o., female   PCP: [name], MD Referring Physician: Donne Hazel, MD  Reason for Consultation: Establishing goals of care and Non pain symptom management  Palliative Care Assessment and Plan Summary of Established Goals of Care and Medical Treatment Preferences   Clinical Assessment/Narrative: Brooke [name] is a 70 year old female with metastatic breast cancer who is followed by Dr. [name] who was admitted from skilled facility due to depleted thiamine requiring IV protocol for replacement.  I know her well from previous encounters.  She requested palliative consult to further discuss advance care planning this admission.  Contacts/Participants in Discussion: Primary Decision Maker: patient    HCPOA: She still has not completed formal HCPOA documents.  We reviewed that legally her sisters would share decision making on her behalf if she does not name a surrogate.  She continues to discuss getting [facility] POA documents completed, however, it has not been done to this point in time.  Code Status/Advance Care Planning:  DNR  Additional Recommendations (Limitations, Scope, Preferences):  Full scope  Psycho-social/Spiritual:   Support System: Ms. Maurica lives alone, but does have two sisters that she keeps in touch with   Desire for further Chaplaincy support: already stopped by for [facility] paperwork   Prognosis: Unable to determine  Discharge Planning: To be determined; she is open to SNF for rehab and with palliative care follow-up         Chief Complaint/History of Present Illness:  Brooke [name] is a 70 year old female with metastatic breast cancer followed by Dr. [name] who is currently receiving therapy with anastrozole and palbociclib who was brought from skilled facility due to severe thiamine depletion.  I met today with Brooke [name].  She reports  things have been going, "okay" at rehab, but the environment is not as welcoming as being in her own home.  She continues to focus on doing her best to improve her functional status and participate in rehab.  She tells me that she had been talking with a friend, [name], about trying to get advance directives planned.  She said that she was told that she needed to have a surrogate decision maker within a 50 mile radius of where she is living.  I told Tessa that I am unsure if there are any business-related statutes requiring a 50 mile radius, but this is not the case for healthcare decision surrogacy.  We also reviewed her current MOST form to ensure that it is up-to-date with her most recent wishes.  Janalee remains invested in plan to continue with rehab, continue to treat treatable conditions, and continue to pursue disease modifying therapy with Dr. [name] for her metastatic cancer.  She desires future hospitalizations and treatment of any treatable conditions.  Her limit of care is that she would not want ICU level interventions such as mechanical ventilation and she would not want CPR at the time of a natural death.  Primary Diagnoses  Present on Admission:  Metastatic malignant neoplasm [facility])   Palliative Review of Systems: Positive for nausea, difficulty swallowing, bilateral foot pain and swelling, generalized weakness.  I have reviewed the medical record, interviewed the patient and family, and examined the patient. The following aspects are pertinent.  Past Medical History:  Diagnosis Date   Anemia    Anxiety    Arthritis    ASCUS of cervix with negative high risk HPV  12/2018   Bilateral dry eyes    Breast cancer, left breast ([place])    Family history of breast cancer 02/02/2017   Family history of breast cancer    Family history of colon cancer 02/02/2017   Family history of colon cancer    Family history of prostate cancer    Family history of prostate cancer in  father 02/02/2017   Family history of uterine cancer    Fibroid    History of kidney stones    Hyperlipidemia    Hypertension    Melanoma of upper arm ([place]) 2009   RIGHT ARM    Obesity (BMI 30.0-34.9)    Osteopenia 09/2017   T score -1.9 max 8% / 0.8% statistically significant decline at right and left hip stable at the spine   PONV (postoperative nausea and vomiting)    Squamous carcinoma     of the skin   UTI (urinary tract infection)    Varicose veins of both lower extremities    Vitamin D deficiency 07/2009   LOW VITAMIN D 29   Vitamin deficiency 07/2008   VITAMIN D LOW 23   Social History   Socioeconomic History   Marital status: Single    Spouse name: Not on file   Number of children: Not on file   Years of education: Not on file   Highest education level: Not on file  Occupational History   Not on file  Tobacco Use   Smoking status: Never Smoker   Smokeless tobacco: Never Used  Vaping Use   Vaping Use: Never used  Substance and Sexual Activity   Alcohol use: No   Drug use: No   Sexual activity: Not Currently    Birth control/protection: Post-menopausal    Comment: 1st intercourse 26yo-1 partner  Other Topics Concern   Not on file  Social History Narrative   Not on file   Social Determinants of Health   Financial Resource Strain: Not on file  Food Insecurity: Not on file  Transportation Needs: Not on file  Physical Activity: Not on file  Stress: Not on file  Social Connections: Not on file   Family History  Problem Relation Age of Onset   Diabetes Father    Hypertension Father    Heart disease Father    Prostate cancer Father 48   Arthritis Father    Breast cancer Sister 63       again at age 33, GT results unk   Skin cancer Sister    Colon cancer Paternal Grandfather 89   Hypertension Mother    Heart failure Mother    Uterine cancer Sister 72       stage IV   Breast cancer Cousin        mat first cousin  dx under 40   Scheduled Meds:  anastrozole  1 mg Oral Daily   Chlorhexidine Gluconate Cloth  6 each Topical Daily   dronabinol  2.5 mg Oral QAC lunch   enoxaparin (LOVENOX) injection  40 mg Subcutaneous Q24H   losartan  12.5 mg Oral Daily   multivitamin  15 mL Oral Daily   sodium chloride flush  10-40 mL Intracatheter Q12H   thiamine  100 mg Oral Daily   Continuous Infusions:  sodium chloride 100 mL/hr at 05/23/20 1702   PRN Meds:.acetaminophen **OR** acetaminophen, ondansetron **OR** ondansetron (ZOFRAN) IV, prochlorperazine, sodium chloride flush Medications Prior to Admission:  Prior to Admission medications   Medication Sig Start Date End Date Taking?  Authorizing Provider  losartan (COZAAR) 25 MG tablet Take 0.5 tablets (12.5 mg total) by mouth daily. 04/24/20  Yes Nita Sells, MD  metoCLOPramide (REGLAN) 10 MG tablet Take 1 tablet (10 mg total) by mouth every 8 (eight) hours as needed for nausea. 04/24/20  Yes Nita Sells, MD  pantoprazole (PROTONIX) 40 MG tablet Take 1 tablet (40 mg total) by mouth daily. 04/25/20  Yes Nita Sells, MD  potassium chloride (KLOR-CON) 10 MEQ tablet Take 40 mEq by mouth daily. 04/24/20  Yes [provider]  ciprofloxacin (CIPRO) 500 MG tablet Take 1 tablet (500 mg total) by mouth 2 (two) times daily. 04/30/20   Magrinat, Virgie Dad, MD  LORazepam (ATIVAN) 0.5 MG tablet Place 1 tablet (0.5 mg total) under the tongue every 8 (eight) hours as needed for anxiety. PT to use for nausea as needed 04/06/20   Magrinat, Virgie Dad, MD  ondansetron (ZOFRAN-ODT) 4 MG disintegrating tablet Take 1 tablet (4 mg total) by mouth every 8 (eight) hours as needed for nausea or vomiting. 04/06/20   Magrinat, Virgie Dad, MD  potassium chloride (KLOR-CON) 10 MEQ tablet Take 4 tablets (40 mEq total) by mouth daily for 4 days. 04/25/20 04/29/20  Nita Sells, MD  prochlorperazine (COMPAZINE) 10 MG tablet Take 1 tablet (10 mg total) by mouth  every 6 (six) hours as needed (Nausea or vomiting). 03/24/20   Magrinat, Virgie Dad, MD   Allergies  Allergen Reactions   Neosporin [Neomycin-Bacitracin Zn-Polymyx] Swelling, Rash and Other (See Comments)    Blisters   Bactrim [Sulfamethoxazole-Trimethoprim]     Made her feel very sick   Benzalkonium Chloride Other (See Comments)    Redness (Pt is unaware)  It works by killing microorganisms and inhibiting their future growth, and for this reason frequently appears as an ingredient in antibacterial hand wipes, antiseptic creams and anti-itch ointments.   Latex Rash   CBC:    Component Value Date/Time   WBC 5.6 05/22/2020 0401   HGB 8.3 (L) 05/22/2020 0401   HGB 13.1 12/20/2017 1144   HGB 13.0 03/03/2017 1359   HCT 26.3 (L) 05/22/2020 0401   HCT 39.7 03/03/2017 1359   PLT 187 05/22/2020 0401   PLT 232 12/20/2017 1144   PLT 281 03/03/2017 1359   MCV 98.1 05/22/2020 0401   MCV 89.6 03/03/2017 1359   NEUTROABS 5.4 05/20/2020 1955   NEUTROABS 5.4 03/03/2017 1359   LYMPHSABS 0.5 (L) 05/20/2020 1955   LYMPHSABS 2.4 03/03/2017 1359   MONOABS 0.3 05/20/2020 1955   MONOABS 0.7 03/03/2017 1359   EOSABS 0.0 05/20/2020 1955   EOSABS 0.1 03/03/2017 1359   BASOSABS 0.0 05/20/2020 1955   BASOSABS 0.0 03/03/2017 1359   Comprehensive Metabolic Panel:    Component Value Date/Time   NA 137 05/24/2020 0323   NA 141 03/03/2017 1358   K 3.9 05/24/2020 0323   K 3.3 (L) 03/03/2017 1358   CL 104 05/24/2020 0323   CO2 22 05/24/2020 0323   CO2 28 03/03/2017 1358   BUN 6 (L) 05/24/2020 0323   BUN 12.7 03/03/2017 1358   CREATININE 0.49 05/24/2020 0323   CREATININE 0.83 02/19/2020 0936   CREATININE 0.9 03/03/2017 1358   GLUCOSE 78 05/24/2020 0323   GLUCOSE 107 03/03/2017 1358   CALCIUM 7.9 (L) 05/24/2020 0323   CALCIUM 9.4 03/03/2017 1358   AST 49 (H) 05/24/2020 0323   AST 43 (H) 09/12/2018 1354   AST 23 03/03/2017 1358   ALT 17 05/24/2020 0323   ALT 84 (  H) 09/12/2018 1354   ALT 25  03/03/2017 1358   ALKPHOS 182 (H) 05/24/2020 0323   ALKPHOS 71 03/03/2017 1358   BILITOT 2.0 (H) 05/24/2020 0323   BILITOT 0.6 09/12/2018 1354   BILITOT 0.52 03/03/2017 1358   PROT 4.1 (L) 05/24/2020 0323   PROT 6.5 03/03/2017 1358   ALBUMIN 2.2 (L) 05/24/2020 0323   ALBUMIN 3.7 03/03/2017 1358    Physical Exam: Vital Signs: BP 140/64 (BP Location: Right Arm)    Pulse 87    Temp 98.2 F (36.8 C) (Oral)    Resp 20    Ht _0  (1.753 m)    Wt 82.6 kg    LMP 03/18/1997    SpO2 96%    BMI 26.89 kg/m  SpO2: SpO2: 96 % O2 Device: O2 Device: Nasal Cannula O2 Flow Rate: O2 Flow Rate (L/min): 2 L/min Intake/output summary:   Intake/Output Summary (Last 24 hours) at 05/24/2020 1000 Last data filed at 05/23/2020 1702 Gross per 24 hour  Intake 1788.13 ml  Output --  Net 1788.13 ml   LBM: Last BM Date: 05/22/20 Baseline Weight: Weight: 82.6 kg Most recent weight: Weight: 82.6 kg  Exam Findings:  General: chronically ill appearing, sitting up in bed in NAD HEENT: dry mucous membranes  Pulm: breathing comfortably on room air Ext: bilateral pitting edema in lower extremities  Neuro: alert and oriented, non-focal          Palliative Performance Scale: 50%     Recent Labs    05/22/20 0401 05/23/20 0433 05/24/20 0323  WBC 5.6  --   --   HGB 8.3*  --   --   PLT 187  --   --   NA 138 136 137  BUN 7* 7* 6*  CREATININE 0.54 0.48 0.49     Time In: 1100 Time Out: 1155 Time Total: 55 Greater than 50%  of this time was spent counseling and coordinating care related to the above assessment and plan.  Micheline Rough, MD [place] Team [phone]

## 2020-05-24 NOTE — Plan of Care (Signed)

## 2020-05-24 NOTE — TOC Progression Note (Signed)
Transition of Care [facility]) - Progression Note    Patient Details  Name: Brooke [name] MRN: [id number] Date of Birth: 1951/02/18  Transition of Care [facility]) CM/SW Contact  Elliot Gurney [place], [place] Phone Number: [phone] 05/24/2020, 3:07 PM  Clinical Narrative:    Return phone call from [facility] are offering a peer to peer review. Deadline for peer review is 05/25/20 at 12 pm. Phone number to call is [phone] option 5. Patient's provider notified.  Transitions of Care to continue to follow   [facility], LCSW Transitions of Care [phone]    Expected Discharge Plan: [place] Barriers to Discharge: Continued Medical Work up  Expected Discharge Plan and Services Expected Discharge Plan: [place]   Discharge Planning Services: CM Consult   Living arrangements for the past 2 months: [place]                                       Social Determinants of Health (SDOH) Interventions    Readmission Risk Interventions Readmission Risk Prevention Plan 05/04/2020  Transportation Screening Complete  PCP or Specialist Appt within 3-5 Days Complete  HRI or Home Care Consult Complete  Social Work Consult for [place] Planning/Counseling Complete  Palliative Care Screening Complete  Medication Review [employer]) Complete  Some recent data might be hidden

## 2020-05-24 NOTE — TOC Progression Note (Signed)
Transition of Care [facility]) - Progression Note    Patient Details  Name: Brooke [name] MRN: [id number] Date of Birth: 05-16-1950  Transition of Care [facility]) CM/SW Contact  Joana Nolton, [place], [place] Phone [phone] 05/24/2020, 12:16 PM  Clinical Narrative:    Phone call to [facility] to check status of insurance authorization for SNF stay. Per representative, authorization is under review by Medical Director.   Transition of Care to continue to follow  [facility], LCSW Transition of Care [phone]    Expected Discharge Plan: [place] Barriers to Discharge: Continued Medical Work up  Expected Discharge Plan and Services Expected Discharge Plan: [place]   Discharge Planning Services: CM Consult   Living arrangements for the past 2 months: [place]                                       Social Determinants of Health (SDOH) Interventions    Readmission Risk Interventions Readmission Risk Prevention Plan 05/04/2020  Transportation Screening Complete  PCP or Specialist Appt within 3-5 Days Complete  HRI or Home Care Consult Complete  Social Work Consult for [place] Planning/Counseling Complete  Palliative Care Screening Complete  Medication Review Press photographer) Complete  Some recent data might be hidden

## 2020-05-24 NOTE — Progress Notes (Signed)
PROGRESS NOTE    Brooke [name]  XVQ:[id number] DOB: 03/23/1950 DOA: 05/20/2020 PCP: [name], MD    Brief Narrative:  70 y.o. female with medical history significant for HTN, metastatic breast cancer. She has been receiving chemotherapy and is followed by Dr. [name]. Patient has had progressive weakness is currently residing at a nursing facility. Patient had outpatient laboratory tests on March 13 and results reported yesterday and she was noted to have undectable vitamin B1 levels. Patient's B12 level is also decreased. Patient was called with the result and instructed to come to the ED because she needs IV replacement therapy. Dr. [name] called the ED and stated the patient needs 500 mg of IV thiamine 3 times daily for the next 3 days. She was also reportedly seen by neurology recently for intermittent confusion and neurology recommended the IV thiamine replacement protocol  Assessment & Plan:   Principal Problem:   Thiamine deficiency Active Problems:   Metastatic malignant neoplasm (HCC)   Hypokalemia   Anemia, chronic disease   Essential hypertension  Principal Problem:   Thiamine deficiency with weakness -Brooke [name] is admitted per request from Hematology service, appreciate input by Dr. [name] -Presented with markedly low thiamine level, requiring IV thiamine course, now  transitioned to PO thiamine to complete replenishment -Appreciate input by PT, recommendation for return to SNF, pending   Active Problems:   Hypokalemia Improved but continues to be low, will replace Recheck bmet in AM and continue to replace as needed    Metastatic malignant neoplasm  Has been followed by Oncology, appreciate in put by Dr. [name]    Anemia, chronic disease Noted to be chronic and stable.  Recheck cbc AM    Essential hypertension Continue home dose of Cozaar as tolerated.   -BP remains stable at this time  Dysphagia -Pt reports difficulty swallowing  pills and food -seen by SLP, recommendation for regular diet with thin liquids noted   DVT prophylaxis: Lovenox subq Code Status: DNR Family Communication: Pt in room, family not at bedside  Status is: Inpatient  Remains inpatient appropriate because:IV treatments appropriate due to intensity of illness or inability to take PO  Dispo: The patient is from: SNF              Anticipated d/c is to: SNF              Patient currently is not medically stable to d/c.   Difficult to place patient No   Consultants:   Oncology  Procedures:     Antimicrobials: Anti-infectives (From admission, onward)   None      Subjective: Still feeling weak  Objective: Vitals:   05/23/20 1327 05/23/20 2224 05/24/20 0547 05/24/20 1242  BP: 138/67 137/68 140/64 132/62  Pulse: 89 93 87 93  Resp: 18 18 20 16   Temp: 98.4 F (36.9 C) 97.9 F (36.6 C) 98.2 F (36.8 C) 98.6 F (37 C)  TempSrc: Oral Oral Oral Oral  SpO2: 97% 96% 96% 93%  Weight:      Height:        Intake/Output Summary (Last 24 hours) at 05/24/2020 1515 Last data filed at 05/24/2020 1511 Gross per 24 hour  Intake 1018.38 ml  Output 225 ml  Net 793.38 ml   Filed Weights   05/20/20 1932  Weight: 82.6 kg    Examination: General exam: Awake, laying in bed, in nad Respiratory system: Normal respiratory effort, no wheezing Cardiovascular system: regular rate, s1, s2 Gastrointestinal system: Soft,  nondistended, positive BS Central nervous system: CN2-12 grossly intact, strength intact Extremities: Perfused, no clubbing Skin: Normal skin turgor, no notable skin lesions seen Psychiatry: Mood normal // no visual hallucinations   Data Reviewed: I have personally reviewed following labs and imaging studies  CBC: Recent Labs  Lab 05/20/20 1955 05/21/20 0530 05/22/20 0401  WBC 6.3 6.1 5.6  NEUTROABS 5.4  --   --   HGB 8.7* 8.8* 8.3*  HCT 27.3* 27.6* 26.3*  MCV 96.1 96.8 98.1  PLT 197 197 606   Basic Metabolic  Panel: Recent Labs  Lab 05/20/20 1955 05/21/20 0530 05/22/20 0401 05/23/20 0433 05/24/20 0323  NA 140 137 138 136 137  K 2.7* 2.7* 3.2* 3.6 3.9  CL 100 97* 102 103 104  CO2 27 26 25 23 22   GLUCOSE 82 90 86 85 78  BUN 7* 8 7* 7* 6*  CREATININE 0.53 0.49 0.54 0.48 0.49  CALCIUM 8.0* 7.6* 7.7* 7.8* 7.9*  MG 1.9  --   --  1.9  --    GFR: Estimated Creatinine Clearance: 76.3 mL/min (by C-G formula based on SCr of 0.49 mg/dL). Liver Function Tests: Recent Labs  Lab 05/22/20 0401 05/23/20 0433 05/24/20 0323  AST 47* 48* 49*  ALT 20 18 17   ALKPHOS 174* 174* 182*  BILITOT 1.6* 1.9* 2.0*  PROT 4.1* 4.1* 4.1*  ALBUMIN 2.2* 2.2* 2.2*   No results for input(s): LIPASE, AMYLASE in the last 168 hours. No results for input(s): AMMONIA in the last 168 hours. Coagulation Profile: No results for input(s): INR, PROTIME in the last 168 hours. Cardiac Enzymes: No results for input(s): CKTOTAL, CKMB, CKMBINDEX, TROPONINI in the last 168 hours. BNP (last 3 results) No results for input(s): PROBNP in the last 8760 hours. HbA1C: No results for input(s): HGBA1C in the last 72 hours. CBG: No results for input(s): GLUCAP in the last 168 hours. Lipid Profile: No results for input(s): CHOL, HDL, LDLCALC, TRIG, CHOLHDL, LDLDIRECT in the last 72 hours. Thyroid Function Tests: No results for input(s): TSH, T4TOTAL, FREET4, T3FREE, THYROIDAB in the last 72 hours. Anemia Panel: No results for input(s): VITAMINB12, FOLATE, FERRITIN, TIBC, IRON, RETICCTPCT in the last 72 hours. Sepsis Labs: No results for input(s): PROCALCITON, LATICACIDVEN in the last 168 hours.  Recent Results (from the past 240 hour(s))  SARS CORONAVIRUS 2 (TAT 6-24 HRS) Nasopharyngeal Nasopharyngeal Swab     Status: None   Collection Time: 05/20/20 10:01 PM   Specimen: Nasopharyngeal Swab  Result Value Ref Range Status   SARS Coronavirus 2 NEGATIVE NEGATIVE Final    Comment: (NOTE) SARS-CoV-2 target nucleic acids are NOT  DETECTED.  The SARS-CoV-2 RNA is generally detectable in upper and lower respiratory specimens during the acute phase of infection. Negative results do not preclude SARS-CoV-2 infection, do not rule out co-infections with other pathogens, and should not be used as the sole basis for treatment or other patient management decisions. Negative results must be combined with clinical observations, patient history, and epidemiological information. The expected result is Negative.  Fact Sheet for Patients: SugarRoll.be  Fact Sheet for Healthcare Providers: https://www.woods-mathews.com/  This test is not yet approved or cleared by the [place] FDA and  has been authorized for detection and/or diagnosis of SARS-CoV-2 by FDA under an Emergency Use Authorization (EUA). This EUA will remain  in effect (meaning this test can be used) for the duration of the COVID-19 declaration under Se ction 564(b)(1) of the Act, 21 U.S.C. section 360bbb-3(b)(1), unless the  authorization is terminated or revoked sooner.  Performed at [facility], [place] [address] [place], [place] [address]   SARS CORONAVIRUS 2 (TAT 6-24 HRS) Nasopharyngeal Nasopharyngeal Swab     Status: None   Collection Time: 05/23/20  1:24 PM   Specimen: Nasopharyngeal Swab  Result Value Ref Range Status   SARS Coronavirus 2 NEGATIVE NEGATIVE Final    Comment: (NOTE) SARS-CoV-2 target nucleic acids are NOT DETECTED.  The SARS-CoV-2 RNA is generally detectable in upper and lower respiratory specimens during the acute phase of infection. Negative results do not preclude SARS-CoV-2 infection, do not rule out co-infections with other pathogens, and should not be used as the sole basis for treatment or other patient management decisions. Negative results must be combined with clinical observations, patient history, and epidemiological information. The expected result is Negative.  Fact  Sheet for Patients: SugarRoll.be  Fact Sheet for Healthcare Providers: https://www.woods-mathews.com/  This test is not yet approved or cleared by the [place] FDA and  has been authorized for detection and/or diagnosis of SARS-CoV-2 by FDA under an Emergency Use Authorization (EUA). This EUA will remain  in effect (meaning this test can be used) for the duration of the COVID-19 declaration under Se ction 564(b)(1) of the Act, 21 U.S.C. section 360bbb-3(b)(1), unless the authorization is terminated or revoked sooner.  Performed at [facility], [place] [address], [place], [place] [address]      Radiology Studies: No results found.  Scheduled Meds: . anastrozole  1 mg Oral Daily  . Chlorhexidine Gluconate Cloth  6 each Topical Daily  . dronabinol  2.5 mg Oral QAC lunch  . enoxaparin (LOVENOX) injection  40 mg Subcutaneous Q24H  . losartan  12.5 mg Oral Daily  . multivitamin  15 mL Oral Daily  . sodium chloride flush  10-40 mL Intracatheter Q12H  . thiamine  100 mg Oral Daily   Continuous Infusions: . sodium chloride 100 mL/hr at 05/23/20 1702     LOS: 4 days   Marylu Lund, MD [facility] Pager On Amion  If 7PM-7AM, please contact night-coverage 05/24/2020, 3:15 PM

## 2020-05-25 DIAGNOSIS — Z515 Encounter for palliative care: Secondary | ICD-10-CM

## 2020-05-25 DIAGNOSIS — E519 Thiamine deficiency, unspecified: Secondary | ICD-10-CM | POA: Diagnosis not present

## 2020-05-25 DIAGNOSIS — Z7189 Other specified counseling: Secondary | ICD-10-CM | POA: Diagnosis not present

## 2020-05-25 DIAGNOSIS — C773 Secondary and unspecified malignant neoplasm of axilla and upper limb lymph nodes: Secondary | ICD-10-CM

## 2020-05-25 DIAGNOSIS — I10 Essential (primary) hypertension: Secondary | ICD-10-CM | POA: Diagnosis not present

## 2020-05-25 LAB — CBC
HCT: 26.9 % — ABNORMAL LOW (ref 36.0–46.0)
Hemoglobin: 8.3 g/dL — ABNORMAL LOW (ref 12.0–15.0)
MCH: 31.1 pg (ref 26.0–34.0)
MCHC: 30.9 g/dL (ref 30.0–36.0)
MCV: 100.7 fL — ABNORMAL HIGH (ref 80.0–100.0)
Platelets: 188 10*3/uL (ref 150–400)
RBC: 2.67 MIL/uL — ABNORMAL LOW (ref 3.87–5.11)
RDW: 27.3 % — ABNORMAL HIGH (ref 11.5–15.5)
WBC: 4.3 10*3/uL (ref 4.0–10.5)
nRBC: 0.5 % — ABNORMAL HIGH (ref 0.0–0.2)

## 2020-05-25 LAB — COMPREHENSIVE METABOLIC PANEL
ALT: 16 U/L (ref 0–44)
AST: 50 U/L — ABNORMAL HIGH (ref 15–41)
Albumin: 2.1 g/dL — ABNORMAL LOW (ref 3.5–5.0)
Alkaline Phosphatase: 174 U/L — ABNORMAL HIGH (ref 38–126)
Anion gap: 8 (ref 5–15)
BUN: 6 mg/dL — ABNORMAL LOW (ref 8–23)
CO2: 22 mmol/L (ref 22–32)
Calcium: 8 mg/dL — ABNORMAL LOW (ref 8.9–10.3)
Chloride: 107 mmol/L (ref 98–111)
Creatinine, Ser: 0.36 mg/dL — ABNORMAL LOW (ref 0.44–1.00)
GFR, Estimated: >60 mL/min (ref >60)
Glucose, Bld: 82 mg/dL (ref 70–99)
Potassium: 3.6 mmol/L (ref 3.5–5.1)
Sodium: 137 mmol/L (ref 135–145)
Total Bilirubin: 2 mg/dL — ABNORMAL HIGH (ref 0.3–1.2)
Total Protein: 3.9 g/dL — ABNORMAL LOW (ref 6.5–8.1)

## 2020-05-25 LAB — VITAMIN B6: Vitamin B6: <1 ug/L — ABNORMAL LOW (ref 2.0–32.8)

## 2020-05-25 LAB — MAGNESIUM: Magnesium: 1.7 mg/dL (ref 1.7–2.4)

## 2020-05-25 MED ORDER — OXYCODONE-ACETAMINOPHEN 5-325 MG PO TABS
1.0000 | ORAL_TABLET | ORAL | Status: DC | PRN
  Administered 2020-05-27 – 2020-05-29 (×2): 1 via ORAL
  Filled 2020-05-25 (×3): qty 1

## 2020-05-25 NOTE — Progress Notes (Signed)
Referral from Palliative.    Chaplain assisting Ms Brooke [name] in completing HCPOA.  Familiar with pt from prior admissions.  [name] presents as A&O.  Participates in discussion of HCPOA.   Chaplain provided education and facilitated notary and witnesses.

## 2020-05-25 NOTE — TOC Progression Note (Addendum)
Transition of Care [facility]) - Progression Note    Patient Details  Name: Brooke [name] MRN: [id number] Date of Birth: 1950/03/18  Transition of Care [facility]) CM/SW Contact  [name], [place] Phone Number: 05/25/2020, 4:26 PM  Clinical Narrative:     CSW was informed by physician that he completed the peer to peer and the medical director for the insurance company is requesting an updated PT note by 5pm today.  CSW notified rehab team they are going to see patient as soon as possible.  CSW awaiting for PT note.  4:45pm CSW spoke to [employer] and informed them that PT is currently working with patient and the therapy note is not in yet.  Per insurance company they wanted it before 5pm, after CSW spoke to [employer] the Market researcher approved extension to 5:45pm.  5:30pm  CSW received therapy note, CSW faxed clinicals to [phone].  CSW spoke to [place] at [address], [place] informed her that clinicals have been faxed to her, and also provided her a verbal review of PT note.  CSW awaiting for decision from insurance company reference number [id number].  Expected Discharge Plan: [place] Barriers to Discharge: Continued Medical Work up  Expected Discharge Plan and Services Expected Discharge Plan: [place]   Discharge Planning Services: CM Consult   Living arrangements for the past 2 months: [place]                                       Social Determinants of Health (SDOH) Interventions    Readmission Risk Interventions Readmission Risk Prevention Plan 05/04/2020  Transportation Screening Complete  PCP or Specialist Appt within 3-5 Days Complete  HRI or [place] Complete  Social Work Consult for [place] Planning/Counseling Complete  Palliative Care Screening Complete  Medication Review [employer]) Complete  Some recent data might be hidden

## 2020-05-25 NOTE — Progress Notes (Signed)
Physical Therapy Treatment Patient Details Name: Brooke [name] MRN: [id number] DOB: 09/12/1950 Today's Date: 05/25/2020    History of Present Illness Pt is 70 y.o. female admitted on 05/20/20 with thiamine deficiency and hypokalemia.  Pt has been receiving chemotherapy. She had recent hospital admission and went to SNF for therapy.  Pt with medical history significant for HTN, metastatic breast cancer.    PT Comments    Pt continues to require significant +2 assist for safe mobility. She was agreeable to working with PT. She is anxious and fearful-requires reassurance and encouragement to progress activity. Remained on [place] O2 during session. Pt was thankful for therapist's persistence with encouraging her to do as much for herself as she is capable of. Will continue to follow and progress activity. Continue to recommend return to SNF for rehab to improve functional mobility and overall strength.    Follow Up Recommendations  SNF     Equipment Recommendations  Wheelchair cushion (measurements PT);Wheelchair (measurements PT);3in1 (PT);Hospital bed (lift equipment)    Recommendations for Other Services       Precautions / Restrictions Precautions Precautions: Fall Precaution Comments: anxiety; pre-medicate if possible Restrictions Weight Bearing Restrictions: No    Mobility  Bed Mobility Overal bed mobility: Needs Assistance Bed Mobility: Rolling;Supine to Sit;Sit to Supine Rolling: Mod assist   Supine to sit: Max assist Sit to supine: Max assist   General bed mobility comments: Required cues for sequencing and technique for all.  Rolling: educated on bending opposite leg, reaching across, and then mod A to complete.  Supine to sit: Pt assisted with sliding legs towards EOB (with cues and assist) then required Max A for trunk and to scoot to EOB.  Sit to Supine: Max A for LEs and trunk.  Repositioning: positioned in trendelenberg and pt able to assist some with UEs.     Transfers Overall transfer level: Needs assistance   Transfers: Sit to/from Stand Sit to Stand: Max assist;+2 physical assistance;+2 safety/equipment;From elevated surface         General transfer comment: Sit to stand practice x total of 6 reps. Pt was able to stand using STEDY x 2 (once from elevated bed, once from STEDY seat). Increased time. Cues for safety, technique, LE placement. Assist for anterior weightshift, power up, and controlled descent. Pt stood for ~5-10 seconds.  Ambulation/Gait             General Gait Details: currently non amb due to profound weakness   Stairs             Wheelchair Mobility    Modified Rankin (Stroke Patients Only)       Balance Overall balance assessment: Needs assistance Sitting-balance support: Bilateral upper extremity supported;Feet supported Sitting balance-Leahy Scale: Fair       Standing balance-Leahy Scale: Poor Standing balance comment: able to static stand in stedy for 5~10 seconds before fatigues                            Cognition Arousal/Alertness: Awake/alert Behavior During Therapy: Anxious Overall Cognitive Status: Within Functional Limits for tasks assessed                                 General Comments: Pt anxious and expressed fearfulness but agreed to therapy and later thanked therapist and tech.      Exercises General Exercises - Lower Extremity Ankle  Circles/Pumps: AROM;Both;10 reps;Supine Quad Sets: AROM;Both;10 reps;Supine Heel Slides: AAROM;Both;5 reps;Supine Hip ABduction/ADduction: AAROM;Both;5 reps;Supine    General Comments        Pertinent Vitals/Pain Pain Assessment: Faces Faces Pain Scale: Hurts even more Pain Location: bil LEs, buttocks Pain Descriptors / Indicators: Grimacing;Guarding;Discomfort Pain Intervention(s): Limited activity within patient's tolerance;Monitored during session;Repositioned    Home Living                       Prior Function            PT Goals (current goals can now be found in the care plan section) Progress towards PT goals: Progressing toward goals    Frequency    Min 2X/week      PT Plan Current plan remains appropriate    Co-evaluation              AM-PAC PT "6 Clicks" Mobility   Outcome Measure  Help needed turning from your back to your side while in a flat bed without using bedrails?: A Lot Help needed moving from lying on your back to sitting on the side of a flat bed without using bedrails?: A Lot Help needed moving to and from a bed to a chair (including a wheelchair)?: Total Help needed standing up from a chair using your arms (e.g., wheelchair or bedside chair)?: A Lot Help needed to walk in hospital room?: Total Help needed climbing 3-5 steps with a railing? : Total 6 Click Score: 9    End of Session Equipment Utilized During Treatment: Gait belt Activity Tolerance: Patient limited by fatigue;Patient limited by pain Patient left: in bed;with call bell/phone within reach;with bed alarm set;with nursing/sitter in room   PT Visit Diagnosis: Difficulty in walking, not elsewhere classified (R26.2);Muscle weakness (generalized) (M62.81)     Time: 3329-5188 PT Time Calculation (min) (ACUTE ONLY): 31 min  Charges:  $Therapeutic Activity: 23-37 mins                         Doreatha Massed, PT Acute Rehabilitation  Office: [phone] Pager: [phone]

## 2020-05-25 NOTE — Care Management Important Message (Signed)
Important Message  Patient Details  IM Letter given to the Patient. Name: Brooke [name] MRN: [id number] Date of Birth: 01-24-1951   Medicare Important Message Given:  Yes     Kerin Salen 05/25/2020, 12:18 PM

## 2020-05-25 NOTE — Progress Notes (Signed)
Daily Progress Note   Patient Name: Brooke [name]       Date: 05/25/2020 DOB: 11-12-1950  Age: 70 y.o. MRN#: [id number] Attending Physician: Donne Hazel, MD Primary Care Physician: [name], MD Admit Date: 05/20/2020  Reason for Consultation/Follow-up: Establishing goals of care  Subjective: I met today with Brooke [name], her friend, Brooke [name], and her sister, Brooke [name] (via phone for part of encounter).  Brooke [name] reports that she is physically feeling well today, but she remains anxious about completing advance directive.  We discussed her wishes for care moving forward and reviewed prior MOST.  After reviewing MOST on file, she requested to complete a new MOST that is clear in her desire to avoid feeding tube.    We discussed in detail regarding MOST form, living will, and HCPOA documentation.  We discussed her overall goal of continuing to treat any treatable medical conditions while avoiding aggressive interventions such as CPR and intubation.  Following discussion, she reports wanting to name her friend, Brooke [name], as her primary surrogate and her sister, Brooke [name], as her secondary.  Length of Stay: 5  Current Medications: Scheduled Meds:  . anastrozole  1 mg Oral Daily  . Chlorhexidine Gluconate Cloth  6 each Topical Daily  . dronabinol  2.5 mg Oral QAC lunch  . enoxaparin (LOVENOX) injection  40 mg Subcutaneous Q24H  . losartan  12.5 mg Oral Daily  . multivitamin  15 mL Oral Daily  . sodium chloride flush  10-40 mL Intracatheter Q12H  . thiamine  100 mg Oral Daily    Continuous Infusions: . sodium chloride 100 mL/hr at 05/25/20 0648    PRN Meds: acetaminophen **OR** acetaminophen, ondansetron **OR** ondansetron (ZOFRAN) IV, oxyCODONE-acetaminophen, prochlorperazine,  sodium chloride flush  Physical Exam         General: chronically ill appearing, sitting up in bed in NAD HEENT: dry mucous membranes  Pulm: breathing comfortably on room air Ext: bilateral pitting edema in lower extremities  Neuro: alert and oriented, non-focal   Vital Signs: BP (!) 142/63   Pulse 92   Temp 98 F (36.7 C) (Oral)   Resp (!) 22   Ht _0  (1.753 m)   Wt 82.6 kg   LMP 03/18/1997   SpO2 95%   BMI 26.89 kg/m  SpO2: SpO2: 95 % O2  Device: O2 Device: Nasal Cannula O2 Flow Rate: O2 Flow Rate (L/min): 2 L/min  Intake/output summary:   Intake/Output Summary (Last 24 hours) at 05/25/2020 2232 Last data filed at 05/25/2020 1600 Gross per 24 hour  Intake 874.59 ml  Output 325 ml  Net 549.59 ml   LBM: Last BM Date: 05/24/20 Baseline Weight: Weight: 82.6 kg Most recent weight: Weight: 82.6 kg       Palliative Assessment/Data:      Patient Active Problem List   Diagnosis Date Noted  . Thiamine deficiency 05/20/2020  . Hypokalemia 05/20/2020  . Anemia, chronic disease 05/20/2020  . Essential hypertension 05/20/2020  . B12 deficiency anemia 05/11/2020  . Pressure injury of skin 05/08/2020  . Malnutrition of moderate degree 05/05/2020  . Intractable vomiting with nausea 05/03/2020  . Acute lower UTI 04/21/2020  . Dehydration 04/21/2020  . Depression with anxiety 04/21/2020  . Transaminitis 04/21/2020  . Metastatic malignant neoplasm ([place])   . Nausea vomiting and diarrhea   . Intractable nausea and vomiting 04/20/2020  . PICC (peripherally inserted central catheter) in place 04/17/2020  . Bone metastases ([place]) 03/11/2020  . Liver metastases ([place]) 03/05/2020  . Lung metastases ([place]) 03/05/2020  . Postoperative state 04/23/2019  . Superficial basal cell carcinoma (BCC) 02/10/2019  . Goals of care, counseling/discussion 12/14/2017  . Family history of uterine cancer   . Family history of prostate cancer   . Melanoma in situ of right upper extremity ([place])  07/18/2017  . Port-A-Cath in place 04/18/2017  . Drug-induced neutropenia ([place]) 03/09/2017  . Family history of breast cancer 02/02/2017  . Family history of colon cancer 02/02/2017  . Family history of prostate cancer in father 02/02/2017  . Malignant neoplasm of overlapping sites of left breast in female, estrogen receptor positive ([place]) 01/31/2017  . Hx of hot flashes, menopausal, HRT in the past 12/19/2011  . Osteopenia, stable, DEXA 2013, recs to repeat in 2018 12/19/2011  . Hx of Hyperlipidemia, LDL 144 in 07/2011 12/19/2011  . Vitamin D insufficiency 12/19/2011  . Fibroid     Palliative Care Assessment & Plan   Patient Profile: Brooke [name] is a 70 year old female with metastatic breast cancer who is followed by Dr. [name] who was admitted from skilled facility due to depleted thiamine requiring IV protocol for replacement.  I know her well from previous encounters.  She requested palliative consult to further discuss advance care planning this admission.  Recommendations/Plan: -Brooke [name] remains invested in plan to continue with rehab, continue to treat treatable conditions, and continue to pursue disease modifying therapy with Dr. [name] for her metastatic cancer.  She desires future hospitalizations and treatment of any treatable conditions.  Her limit of care is that she would not want ICU level interventions such as mechanical ventilation and she would not want CPR at the time of a natural death. -Discussed advance care planning with emphasis on surrogate decision making, HCPOA and living wills.  Completed new MOST today.  DNR, limited additional interventions, IV abx and fluids if indicated, no feeding tube. - She would like to name her friend, Brooke [name], and her sister, Brooke [name], as her surrogates.  Appreciate spiritual care assistance. - She will benefit from palliative care following at facility.  Please include this on her discharge summary if agree it is appropriate. - Please call  for further palliative specific needs with which we can be of assistance.  Goals of Care and Additional Recommendations: Limitations on Scope of Treatment: Full Scope Treatment  Code Status:  Code Status Orders  (From admission, onward)         Start     Ordered   05/21/20 0441  Do not attempt resuscitation (DNR)  Continuous       Question Answer Comment  In the event of cardiac or respiratory ARREST Do not call a "code blue"   In the event of cardiac or respiratory ARREST Do not perform Intubation, CPR, defibrillation or ACLS   In the event of cardiac or respiratory ARREST Use medication by any route, position, wound care, and other measures to relive pain and suffering. May use oxygen, suction and manual treatment of airway obstruction as needed for comfort.   Comments Confirmed with patient      05/21/20 0440        Code Status History    Date Active Date Inactive Code Status Order ID Comments User Context   05/02/2020 1940 05/12/2020 2306 DNR [id number]  Jonnie Finner, DO Inpatient   04/22/2020 1030 04/24/2020 2028 DNR [id number]  Micheline Rough, MD Inpatient   04/20/2020 1807 04/22/2020 1029 Full Code [id number]  Jonnie Finner, DO ED   04/23/2019 1126 04/24/2019 1408 Full Code [id number]  [name], MD Inpatient   04/23/2019 0751 04/23/2019 1126 Full Code [id number]  [name], MD Inpatient   09/25/2017 1520 09/26/2017 1836 Full Code [id number]  Jovita Kussmaul, MD Inpatient   03/02/2017 1542 03/03/2017 1242 Full Code [id number]  Jovita Kussmaul, MD Inpatient   Advance Care Planning Activity      Discharge Planning: [place] for rehab with Palliative care service follow-up  Thank you for allowing the Palliative Medicine Team to assist in the care of this patient.  Start time: 0930 End time: 1100 Total Time 90 minutes Prolonged Time Billed Yes   Greater than 50%  of this time was spent counseling and coordinating care related to the above assessment and  plan.  Micheline Rough, MD  Please contact Palliative Medicine Team phone at [phone] for questions and concerns.

## 2020-05-25 NOTE — Progress Notes (Signed)
PROGRESS NOTE    Brooke [name]  IWL:[id number] DOB: [birth year] DOA: 05/20/2020 PCP: Brooke Macadam, MD    Brief Narrative:  70 y.o. female with medical history significant for HTN, metastatic breast cancer. She has been receiving chemotherapy and is followed by Brooke [name]. Patient has had progressive weakness is currently residing at a nursing facility. Patient had outpatient laboratory tests on March 13 and results reported yesterday and she was noted to have undectable vitamin B1 levels. Patient's B12 level is also decreased. Patient was called with the result and instructed to come to the ED because she needs IV replacement therapy. Brooke [name] called the ED and stated the patient needs 500 mg of IV thiamine 3 times daily for the next 3 days. She was also reportedly seen by neurology recently for intermittent confusion and neurology recommended the IV thiamine replacement protocol  Assessment & Plan:   Principal Problem:   Thiamine deficiency Active Problems:   Metastatic malignant neoplasm (HCC)   Hypokalemia   Anemia, chronic disease   Essential hypertension  Principal Problem:   Thiamine deficiency with weakness -Brooke [name] is admitted per request from Hematology service, appreciate input by Brooke [name] -Presented with markedly low thiamine level, requiring IV thiamine course, now  transitioned to PO thiamine to complete replenishment -Appreciate input by PT, recs were noted for SNF -Appreciate input by Palliative Care who planned to meet with pt and her friend this AM to discuss goals of care   Active Problems:   Hypokalemia Improved and replaced -continue to follow electrolytes    Metastatic malignant neoplasm  Has been followed by Oncology, appreciate in put by Brooke [name]    Anemia, chronic disease Noted to be chronic and stable.  Continue to follow cbc trends    Essential hypertension Continue home dose of Cozaar as tolerated.   -BP remains  stable at this time  Dysphagia -Pt continues to have difficulty with tolerating PO intake -Cleared for regular diet with thin liquids per SLP  Moderate protein calorie malnutrition -minimal PO intake recently -Pt reports not tolerating PO intake recently -Palliative Care to discuss goals of care, including nutrition and question about PEG if she continues to not tolerate PO intake   DVT prophylaxis: Lovenox subq Code Status: DNR Family Communication: Pt in room, family not at bedside  Status is: Inpatient  Remains inpatient appropriate because:IV treatments appropriate due to intensity of illness or inability to take PO  Dispo: The patient is from: SNF              Anticipated d/c is to: SNF              Patient currently is not medically stable to d/c.   Difficult to place patient No   Consultants:   Oncology  Palliative Care  Procedures:     Antimicrobials: Anti-infectives (From admission, onward)   None      Subjective: Feeling weak this AM, but still determined to improve with therapy  Objective: Vitals:   05/24/20 1242 05/24/20 2126 05/25/20 0430 05/25/20 1214  BP: 132/62 (!) 129/58 132/62 (!) 142/63  Pulse: 93 91 92 92  Resp: 16 16 18  (!) 22  Temp: 98.6 F (37 C) (!) 97.4 F (36.3 C) 97.8 F (36.6 C) 98 F (36.7 C)  TempSrc: Oral Oral Oral Oral  SpO2: 93% 96% 93% 95%  Weight:      Height:        Intake/Output Summary (Last 24 hours) at 05/25/2020  [place] filed at 05/24/2020 2300 Gross per 24 hour  Intake 1574.59 ml  Output 225 ml  Net 1349.59 ml   Filed Weights   05/20/20 1932  Weight: 82.6 kg    Examination: General exam: Conversant, in no acute distress Respiratory system: normal chest rise, clear, no audible wheezing Cardiovascular system: regular rhythm, s1-s2 Gastrointestinal system: Nondistended, nontender, pos BS Central nervous system: No seizures, no tremors Extremities: No cyanosis, no joint deformities Skin: No  rashes, no pallor Psychiatry: Affect normal // no auditory hallucinations   Data Reviewed: I have personally reviewed following labs and imaging studies  CBC: Recent Labs  Lab 05/20/20 1955 05/21/20 0530 05/22/20 0401 05/25/20 0305  WBC 6.3 6.1 5.6 4.3  NEUTROABS 5.4  --   --   --   HGB 8.7* 8.8* 8.3* 8.3*  HCT 27.3* 27.6* 26.3* 26.9*  MCV 96.1 96.8 98.1 100.7*  PLT 197 197 187 270   Basic Metabolic Panel: Recent Labs  Lab 05/20/20 1955 05/21/20 0530 05/22/20 0401 05/23/20 0433 05/24/20 0323 05/25/20 0305  NA 140 137 138 136 137 137  K 2.7* 2.7* 3.2* 3.6 3.9 3.6  CL 100 97* 102 103 104 107  CO2 27 26 25 23 22 22   GLUCOSE 82 90 86 85 78 82  BUN 7* 8 7* 7* 6* 6*  CREATININE 0.53 0.49 0.54 0.48 0.49 0.36*  CALCIUM 8.0* 7.6* 7.7* 7.8* 7.9* 8.0*  MG 1.9  --   --  1.9  --  1.7   GFR: Estimated Creatinine Clearance: 76.3 mL/min (A) (by C-G formula based on SCr of 0.36 mg/dL (L)). Liver Function Tests: Recent Labs  Lab 05/22/20 0401 05/23/20 0433 05/24/20 0323 05/25/20 0305  AST 47* 48* 49* 50*  ALT 20 18 17 16   ALKPHOS 174* 174* 182* 174*  BILITOT 1.6* 1.9* 2.0* 2.0*  PROT 4.1* 4.1* 4.1* 3.9*  ALBUMIN 2.2* 2.2* 2.2* 2.1*   No results for input(s): LIPASE, AMYLASE in the last 168 hours. No results for input(s): AMMONIA in the last 168 hours. Coagulation Profile: No results for input(s): INR, PROTIME in the last 168 hours. Cardiac Enzymes: No results for input(s): CKTOTAL, CKMB, CKMBINDEX, TROPONINI in the last 168 hours. BNP (last 3 results) No results for input(s): PROBNP in the last 8760 hours. HbA1C: No results for input(s): HGBA1C in the last 72 hours. CBG: No results for input(s): GLUCAP in the last 168 hours. Lipid Profile: No results for input(s): CHOL, HDL, LDLCALC, TRIG, CHOLHDL, LDLDIRECT in the last 72 hours. Thyroid Function Tests: No results for input(s): TSH, T4TOTAL, FREET4, T3FREE, THYROIDAB in the last 72 hours. Anemia Panel: No results  for input(s): VITAMINB12, FOLATE, FERRITIN, TIBC, IRON, RETICCTPCT in the last 72 hours. Sepsis Labs: No results for input(s): PROCALCITON, LATICACIDVEN in the last 168 hours.  Recent Results (from the past 240 hour(s))  SARS CORONAVIRUS 2 (TAT 6-24 HRS) Nasopharyngeal Nasopharyngeal Swab     Status: None   Collection Time: 05/20/20 10:01 PM   Specimen: Nasopharyngeal Swab  Result Value Ref Range Status   SARS Coronavirus 2 NEGATIVE NEGATIVE Final    Comment: (NOTE) SARS-CoV-2 target nucleic acids are NOT DETECTED.  The SARS-CoV-2 RNA is generally detectable in upper and lower respiratory specimens during the acute phase of infection. Negative results do not preclude SARS-CoV-2 infection, do not rule out co-infections with other pathogens, and should not be used as the sole basis for treatment or other patient management decisions. Negative results must be combined with  clinical observations, patient history, and epidemiological information. The expected result is Negative.  Fact Sheet for Patients: SugarRoll.be  Fact Sheet for Healthcare Providers: https://www.woods-mathews.com/  This test is not yet approved or cleared by the [place] FDA and  has been authorized for detection and/or diagnosis of SARS-CoV-2 by FDA under an Emergency Use Authorization (EUA). This EUA will remain  in effect (meaning this test can be used) for the duration of the COVID-19 declaration under Se ction 564(b)(1) of the Act, 21 U.S.C. section 360bbb-3(b)(1), unless the authorization is terminated or revoked sooner.  Performed at [facility], [place] [address] [place], [place] [address]   SARS CORONAVIRUS 2 (TAT 6-24 HRS) Nasopharyngeal Nasopharyngeal Swab     Status: None   Collection Time: 05/23/20  1:24 PM   Specimen: Nasopharyngeal Swab  Result Value Ref Range Status   SARS Coronavirus 2 NEGATIVE NEGATIVE Final    Comment: (NOTE) SARS-CoV-2  target nucleic acids are NOT DETECTED.  The SARS-CoV-2 RNA is generally detectable in upper and lower respiratory specimens during the acute phase of infection. Negative results do not preclude SARS-CoV-2 infection, do not rule out co-infections with other pathogens, and should not be used as the sole basis for treatment or other patient management decisions. Negative results must be combined with clinical observations, patient history, and epidemiological information. The expected result is Negative.  Fact Sheet for Patients: SugarRoll.be  Fact Sheet for Healthcare Providers: https://www.woods-mathews.com/  This test is not yet approved or cleared by the [place] FDA and  has been authorized for detection and/or diagnosis of SARS-CoV-2 by FDA under an Emergency Use Authorization (EUA). This EUA will remain  in effect (meaning this test can be used) for the duration of the COVID-19 declaration under Se ction 564(b)(1) of the Act, 21 U.S.C. section 360bbb-3(b)(1), unless the authorization is terminated or revoked sooner.  Performed at [facility], [place] [address] [place], [place] [address]      Radiology Studies: No results found.  Scheduled Meds: . anastrozole  1 mg Oral Daily  . Chlorhexidine Gluconate Cloth  6 each Topical Daily  . dronabinol  2.5 mg Oral QAC lunch  . enoxaparin (LOVENOX) injection  40 mg Subcutaneous Q24H  . losartan  12.5 mg Oral Daily  . multivitamin  15 mL Oral Daily  . sodium chloride flush  10-40 mL Intracatheter Q12H  . thiamine  100 mg Oral Daily   Continuous Infusions: . sodium chloride 100 mL/hr at 05/25/20 0737     LOS: 5 days   Marylu Lund, MD [facility] Pager On Amion  If 7PM-7AM, please contact night-coverage 05/25/2020, 12:57 PM

## 2020-05-25 NOTE — Progress Notes (Signed)
Daily Progress Note   Patient Name: Brooke [name]       Date: 05/25/2020 DOB: 06/30/1950  Age: 70 y.o. MRN#: [id number] Attending Physician: Donne Hazel, MD Primary Care Physician: [name], MD Admit Date: 05/20/2020  Reason for Consultation/Follow-up: Establishing goals of care  Subjective: I saw and examined Brooke [name] today. She was in bed in no distress.   We further discussed advance directives per her request.  Reviewed differences between MOST, HCPOA, and living will.  She asked me to call and discuss with her friend, Brooke [name], as well.  I called and was able to reach [place].  Discussed that the hospital can support in completion of any documents related to [facility], but the hospital does not facilitate completion of paperwork related to financial matters.  Length of Stay: 5  Current Medications: Scheduled Meds:  . anastrozole  1 mg Oral Daily  . Chlorhexidine Gluconate Cloth  6 each Topical Daily  . dronabinol  2.5 mg Oral QAC lunch  . enoxaparin (LOVENOX) injection  40 mg Subcutaneous Q24H  . losartan  12.5 mg Oral Daily  . multivitamin  15 mL Oral Daily  . sodium chloride flush  10-40 mL Intracatheter Q12H  . thiamine  100 mg Oral Daily    Continuous Infusions: . sodium chloride 100 mL/hr at 05/25/20 0648    PRN Meds: acetaminophen **OR** acetaminophen, ondansetron **OR** ondansetron (ZOFRAN) IV, prochlorperazine, sodium chloride flush  Physical Exam         General: chronically ill appearing, sitting up in bed in NAD HEENT: dry mucous membranes  Pulm: breathing comfortably on room air Ext: bilateral pitting edema in lower extremities  Neuro: alert and oriented, non-focal   Vital Signs: BP 132/62 (BP Location: Right Arm)   Pulse 92   Temp  97.8 F (36.6 C) (Oral)   Resp 18   Ht 5\' 9"  (1.753 m)   Wt 82.6 kg   LMP 03/18/1997   SpO2 93%   BMI 26.89 kg/m  SpO2: SpO2: 93 % O2 Device: O2 Device: Nasal Cannula O2 Flow Rate: O2 Flow Rate (L/min): 2 L/min  Intake/output summary:   Intake/Output Summary (Last 24 hours) at 05/25/2020 0704 Last data filed at 05/24/2020 2300 Gross per 24 hour  Intake 1599.59 ml  Output 450 ml  Net 1149.59 ml  LBM: Last BM Date: 05/24/20 Baseline Weight: Weight: 82.6 kg Most recent weight: Weight: 82.6 kg       Palliative Assessment/Data:      Patient Active Problem List   Diagnosis Date Noted  . Thiamine deficiency 05/20/2020  . Hypokalemia 05/20/2020  . Anemia, chronic disease 05/20/2020  . Essential hypertension 05/20/2020  . B12 deficiency anemia 05/11/2020  . Pressure injury of skin 05/08/2020  . Malnutrition of moderate degree 05/05/2020  . Intractable vomiting with nausea 05/03/2020  . Acute lower UTI 04/21/2020  . Dehydration 04/21/2020  . Depression with anxiety 04/21/2020  . Transaminitis 04/21/2020  . Metastatic malignant neoplasm ([place])   . Nausea vomiting and diarrhea   . Intractable nausea and vomiting 04/20/2020  . PICC (peripherally inserted central catheter) in place 04/17/2020  . Bone metastases ([place]) 03/11/2020  . Liver metastases ([place]) 03/05/2020  . Lung metastases ([place]) 03/05/2020  . Postoperative state 04/23/2019  . Superficial basal cell carcinoma (BCC) 02/10/2019  . Goals of care, counseling/discussion 12/14/2017  . Family history of uterine cancer   . Family history of prostate cancer   . Melanoma in situ of right upper extremity ([place]) 07/18/2017  . Port-A-Cath in place 04/18/2017  . Drug-induced neutropenia ([place]) 03/09/2017  . Family history of breast cancer 02/02/2017  . Family history of colon cancer 02/02/2017  . Family history of prostate cancer in father 02/02/2017  . Malignant neoplasm of overlapping sites of left breast in female,  estrogen receptor positive ([place]) 01/31/2017  . Hx of hot flashes, menopausal, HRT in the past 12/19/2011  . Osteopenia, stable, DEXA 2013, recs to repeat in 2018 12/19/2011  . Hx of Hyperlipidemia, LDL 144 in 07/2011 12/19/2011  . Vitamin D insufficiency 12/19/2011  . Fibroid     Palliative Care Assessment & Plan   Patient Profile: Brooke [name] is a 70 year old female with metastatic breast cancer who is followed by Dr. [name] who was admitted from skilled facility due to depleted thiamine requiring IV protocol for replacement.  I know her well from previous encounters.  She requested palliative consult to further discuss advance care planning this admission.  Recommendations/Plan: -Aanyah remains invested in plan to continue with rehab, continue to treat treatable conditions, and continue to pursue disease modifying therapy with Dr. [name] for her metastatic cancer.  She desires future hospitalizations and treatment of any treatable conditions.  Her limit of care is that she would not want ICU level interventions such as mechanical ventilation and she would not want CPR at the time of a natural death. -Discussed advance care planning with emphasis on surrogate decision making, HCPOA, and living wills.  SHe would like to meet tomorrow morning in conjunction with her friend, Brooke [name], to further discuss.  We are planning for an meeting at 0930.  Goals of Care and Additional Recommendations:  Limitations on Scope of Treatment: Full Scope Treatment  Code Status:    Code Status Orders  (From admission, onward)         Start     Ordered   05/21/20 0441  Do not attempt resuscitation (DNR)  Continuous       Question Answer Comment  In the event of cardiac or respiratory ARREST Do not call a "code blue"   In the event of cardiac or respiratory ARREST Do not perform Intubation, CPR, defibrillation or ACLS   In the event of cardiac or respiratory ARREST Use medication by any route, position,  wound care, and other measures to relive pain and  suffering. May use oxygen, suction and manual treatment of airway obstruction as needed for comfort.   Comments Confirmed with patient      05/21/20 0440        Code Status History    Date Active Date Inactive Code Status Order ID Comments User Context   05/02/2020 1940 05/12/2020 2306 DNR [id number]  Jonnie Finner, DO Inpatient   04/22/2020 1030 04/24/2020 2028 DNR [id number]  Micheline Rough, MD Inpatient   04/20/2020 1807 04/22/2020 1029 Full Code [id number]  Jonnie Finner, DO ED   04/23/2019 1126 04/24/2019 1408 Full Code [id number]  [name], MD Inpatient   04/23/2019 0751 04/23/2019 1126 Full Code [id number]  [name], MD Inpatient   09/25/2017 1520 09/26/2017 1836 Full Code [id number]  Jovita Kussmaul, MD Inpatient   03/02/2017 1542 03/03/2017 1242 Full Code [id number]  Jovita Kussmaul, MD Inpatient   Advance Care Planning Activity      Discharge Planning: [place] for rehab with Palliative care service follow-up  Thank you for allowing the Palliative Medicine Team to assist in the care of this patient.   Total Time 45 Prolonged Time Billed No      Greater than 50%  of this time was spent counseling and coordinating care related to the above assessment and plan.  Micheline Rough, MD  Please contact Palliative Medicine Team phone at [phone] for questions and concerns.

## 2020-05-25 NOTE — Progress Notes (Signed)
Brooke [name]   DOB:1950/07/06   ZM#:[id number]   TKP#:[id number]  Subjective:  Brooke [name] completed her thiamine replacement and is ready to return to [name], assuming they have a bed. She is working with Palliative Care to fine-tune communication with family. She looks better--more alert, moves legs more--than before admission.   Objective: white woman examined in bed Vitals:   05/24/20 2126 05/25/20 0430  BP: (!) 129/58 132/62  Pulse: 91 92  Resp: 16 18  Temp: (!) 97.4 F (36.3 C) 97.8 F (36.6 C)  SpO2: 96% 93%    Body mass index is 26.89 kg/m.  Intake/Output Summary (Last 24 hours) at 05/25/2020 0814 Last data filed at 05/24/2020 2300 Gross per 24 hour  Intake 1599.59 ml  Output 450 ml  Net 1149.59 ml    I have not seen Brooke [name] at any point during her multiple hospital admissions   CBG (last 3)  No results for input(s): GLUCAP in the last 72 hours.   Labs:  Lab Results  Component Value Date   WBC 4.3 05/25/2020   HGB 8.3 (L) 05/25/2020   HCT 26.9 (L) 05/25/2020   MCV 100.7 (H) 05/25/2020   PLT 188 05/25/2020   NEUTROABS 5.4 05/20/2020    '@LASTCHEMISTRY'$ @  Urine Studies No results for input(s): UHGB, CRYS in the last 72 hours.  Invalid input(s): UACOL, UAPR, USPG, UPH, UTP, UGL, [place], UBIL, UNIT, UROB, [place], UEPI, UWBC, [place], [place], [place], [place], [place]  Basic Metabolic Panel: Recent Labs  Lab 05/20/20 1955 05/21/20 0530 05/22/20 0401 05/23/20 0433 05/24/20 0323 05/25/20 0305  NA 140 137 138 136 137 137  K 2.7* 2.7* 3.2* 3.6 3.9 3.6  CL 100 97* 102 103 104 107  CO2 $Re'27 26 25 23 22 22  'mgc$ GLUCOSE 82 90 86 85 78 82  BUN 7* 8 7* 7* 6* 6*  CREATININE 0.53 0.49 0.54 0.48 0.49 0.36*  CALCIUM 8.0* 7.6* 7.7* 7.8* 7.9* 8.0*  MG 1.9  --   --  1.9  --  1.7   GFR Estimated Creatinine Clearance: 76.3 mL/min (A) (by C-G formula based on SCr of 0.36 mg/dL (L)). Liver Function Tests: Recent Labs  Lab 05/22/20 0401 05/23/20 0433 05/24/20 0323 05/25/20 0305  AST  47* 48* 49* 50*  ALT $Re'20 18 17 16  'tYK$ ALKPHOS 174* 174* 182* 174*  BILITOT 1.6* 1.9* 2.0* 2.0*  PROT 4.1* 4.1* 4.1* 3.9*  ALBUMIN 2.2* 2.2* 2.2* 2.1*   No results for input(s): LIPASE, AMYLASE in the last 168 hours. No results for input(s): AMMONIA in the last 168 hours. Coagulation profile No results for input(s): INR, PROTIME in the last 168 hours.  CBC: Recent Labs  Lab 05/20/20 1955 05/21/20 0530 05/22/20 0401 05/25/20 0305  WBC 6.3 6.1 5.6 4.3  NEUTROABS 5.4  --   --   --   HGB 8.7* 8.8* 8.3* 8.3*  HCT 27.3* 27.6* 26.3* 26.9*  MCV 96.1 96.8 98.1 100.7*  PLT 197 197 187 188   Cardiac Enzymes: No results for input(s): CKTOTAL, CKMB, CKMBINDEX, TROPONINI in the last 168 hours. BNP: Invalid input(s): POCBNP CBG: No results for input(s): GLUCAP in the last 168 hours. D-Dimer No results for input(s): DDIMER in the last 72 hours. Hgb A1c No results for input(s): HGBA1C in the last 72 hours. Lipid Profile No results for input(s): CHOL, HDL, LDLCALC, TRIG, CHOLHDL, LDLDIRECT in the last 72 hours. Thyroid function studies No results for input(s): TSH, T4TOTAL, T3FREE, THYROIDAB in the last 72 hours.  Invalid input(s): FREET3 Anemia work up No results for input(s): VITAMINB12, FOLATE, FERRITIN, TIBC, IRON, RETICCTPCT in the last 72 hours. Microbiology Recent Results (from the past 240 hour(s))  SARS CORONAVIRUS 2 (TAT 6-24 HRS) Nasopharyngeal Nasopharyngeal Swab     Status: None   Collection Time: 05/20/20 10:01 PM   Specimen: Nasopharyngeal Swab  Result Value Ref Range Status   SARS Coronavirus 2 NEGATIVE NEGATIVE Final    Comment: (NOTE) SARS-CoV-2 target nucleic acids are NOT DETECTED.  The SARS-CoV-2 RNA is generally detectable in upper and lower respiratory specimens during the acute phase of infection. Negative results do not preclude SARS-CoV-2 infection, do not rule out co-infections with other pathogens, and should not be used as the sole basis for treatment  or other patient management decisions. Negative results must be combined with clinical observations, patient history, and epidemiological information. The expected result is Negative.  Fact Sheet for Patients: SugarRoll.be  Fact Sheet for Healthcare Providers: https://www.woods-mathews.com/  This test is not yet approved or cleared by the [place] FDA and  has been authorized for detection and/or diagnosis of SARS-CoV-2 by FDA under an Emergency Use Authorization (EUA). This EUA will remain  in effect (meaning this test can be used) for the duration of the COVID-19 declaration under Se ction 564(b)(1) of the Act, 21 U.S.C. section 360bbb-3(b)(1), unless the authorization is terminated or revoked sooner.  Performed at [facility], [place] [address], [place], [place] [address]   SARS CORONAVIRUS 2 (TAT 6-24 HRS) Nasopharyngeal Nasopharyngeal Swab     Status: None   Collection Time: 05/23/20  1:24 PM   Specimen: Nasopharyngeal Swab  Result Value Ref Range Status   SARS Coronavirus 2 NEGATIVE NEGATIVE Final    Comment: (NOTE) SARS-CoV-2 target nucleic acids are NOT DETECTED.  The SARS-CoV-2 RNA is generally detectable in upper and lower respiratory specimens during the acute phase of infection. Negative results do not preclude SARS-CoV-2 infection, do not rule out co-infections with other pathogens, and should not be used as the sole basis for treatment or other patient management decisions. Negative results must be combined with clinical observations, patient history, and epidemiological information. The expected result is Negative.  Fact Sheet for Patients: SugarRoll.be  Fact Sheet for Healthcare Providers: https://www.woods-mathews.com/  This test is not yet approved or cleared by the [place] FDA and  has been authorized for detection and/or diagnosis of SARS-CoV-2 by FDA under  an Emergency Use Authorization (EUA). This EUA will remain  in effect (meaning this test can be used) for the duration of the COVID-19 declaration under Se ction 564(b)(1) of the Act, 21 U.S.C. section 360bbb-3(b)(1), unless the authorization is terminated or revoked sooner.  Performed at [facility], [place] [address], [place], [place] [address]       Studies:  No results found.  Assessment: 70 y.o. [place] woman admitted for thiamine replacement, with a history of stage IV breast cancer as follows:  (1) status post bilateral breast biopsies 01/26/2017, showing: in the right breast, a complex sclerosing lesion, status post lumpectomy 09/25/2017, with no malignancy identified.  (2) in the left breast, a cT2 pN1 invasive ductal carcinoma (with some lobular features but E-cadherin positive), grade 2, estrogen and progesterone receptor positive, HER-2 not amplified, with an MIB-1 of 15-20%             (a) breast MRI 02/04/2017 suggests a T3 N1 tumor  (3) started neoadjuvant cyclophosphamide/docetaxel 03/03/2017, discontinued after 1 cycle with very poor tolerance             (  a) started cyclophosphamide/methotrexate/fluorouracil (CMF) 03/28/2017, repeated x7 cycles, last dose 08/01/2017  (4) status post left modified radical mastectomy 09/25/17 for a ypT5 ypN2, stage IIIA invasive ductal carcinoma, grade 2, again estrogen and progesterone receptor positive, HER-2 not amplified  (5) adjuvant radiation completed 12/21/2017             (a) capecitabine sensitization tolerated only the initial week of radiation (b) Site/dose:The patient initially received a dose of 50.4 Gy in 28 fractions to the leftchest wall andleftsupraclavicular region. This was delivered using a 3-D conformal, 4 field technique. The patient then received a boost to the mastectomy scar. This delivered an additional 10 Gy in 5 fractions using an en face electron field. The total dose was 60.4 Gy.  ( 6)  tamoxifen started 03/09/2018, discontinued January 2022 with metastatic progression  (7) genetics testing 12/12/2017 through the Multi-Gene Panel offered by [facility] found no deleterious mutations in AIP, ALK, APC, ATM, AXIN2,BAP1,  BARD1, BLM, BMPR1A, BRCA1, BRCA2, BRIP1, CASR, CDC73, CDH1, CDK4, CDKN1B, CDKN1C, CDKN2A (p14ARF), CDKN2A (p16INK4a), CEBPA, CHEK2, CTNNA1, DICER1, DIS3L2, EGFR (c.2369C>T, p.Thr790Met variant only), EPCAM (Deletion/duplication testing only), FH, FLCN, GATA2, GPC3, GREM1 (Promoter region deletion/duplication testing only), HOXB13 (c.251G>A, p.Gly84Glu), HRAS, KIT, MAX, MEN1, MET, MITF (c.952G>A, p.Glu318Lys variant only), MLH1, MSH2, MSH3, MSH6, MUTYH, NBN, NF1, NF2, NTHL1, PALB2, PDGFRA, PHOX2B, PMS2, POLD1, POLE, POT1, PRKAR1A, PTCH1, PTEN, RAD50, RAD51C, RAD51D, RB1, RECQL4, RET, RUNX1, SDHAF2, SDHA (sequence changes only), SDHB, SDHC, SDHD, SMAD4, SMARCA4, SMARCB1, SMARCE1, STK11, SUFU, TERC, TERT, TMEM127, TP53, TSC1, TSC2, VHL, WRN and WT1.   (8) Foundation 1 testing shows stable microsatellite status and 1 mutation/ Mb, TP53 mutations x2             (a) PD-L1 dated July 2019 negative  METASTATIC DISEASE: January 2022 (9) CT angio of the chest 02/27/2020 shows multiple hilar nodes consistent with prior granulomatous infection, multiple small pulmonary nodules, multiple punctate granulomas throughout the liver and spleen, nodular hepatic contour and areas of mottling possibly due to steatosis, 1.6 cm left adrenal nodule             (a) PET scan 03/05/2020 shows mildly to highly hypermetabolic adenopathy, the pulmonary nodules being mildly to not hypermetabolic, a large right hepatic liver lesion with an SUV of 13.2 and additional hypermetabolic right and left lobe foci, the adrenal lesion being hypermetabolic with an SUV of 16.  There are also peritoneal nodules and innumerable foci of bony involvement.  Incidental note was made of small heterogeneous foci of increased  uptake in the cerebellum.  The CT images did not show gross abnormality in the visualized brain             (b) on 03/09/2020 CA 27-29 was 983.0, CEA 744.17, the CA 19-9 normal at 32             (c) liver biopsy 03/13/2020 confirms metastati carcinoma, estrogen receptor positive, progesterone and HER 2 negative, with an Mib-1 of 40%             (d) brain MRI 03/26/2020 shows dural involvement including a 1.0 cm dural-based cerebellar lesion  (10) paclitaxel started 03/30/2020, repeated x3, last dose 04/30/2020 w/o evidence of response  (11) zoledronate started 03/19/2020, to be repeated every 12 weeks (next dose 06/11/2020)  (12)anastrozolestarted 05/07/2020 (a) palbociclib 125 mg daily, 21 days off, 7 days off, started 05/15/2018  Plan:  Brooke [name] completed her thiamine replacement and will continue on oral thiamine indefinitely. She is on  Anastrozole  with no side-effects thatr she is aware of and is also tolerating palbociclib well. The palbociclib will continue through April 6, after which she is off a week before starting the next cycle.  I encouraged her to get OOBTC and participate in Rehab as vigorously as possible.  She has an appointment at the cancer center 04/14 at 3"15 PM.  Anticipate d/c back to SNF as soon as bed opens. Please let me know if we can be of further help at this point.     Chauncey Cruel, MD 05/25/2020  8:14 AM Medical Oncology and Hematology [facility] [address] [place], [place] [address] Tel. [phone]    Fax. [phone]

## 2020-05-25 NOTE — Plan of Care (Signed)

## 2020-05-26 ENCOUNTER — Encounter (HOSPITAL_COMMUNITY): Payer: Self-pay | Admitting: Family Medicine

## 2020-05-26 DIAGNOSIS — E519 Thiamine deficiency, unspecified: Secondary | ICD-10-CM | POA: Diagnosis not present

## 2020-05-26 DIAGNOSIS — I10 Essential (primary) hypertension: Secondary | ICD-10-CM | POA: Diagnosis not present

## 2020-05-26 LAB — COMPREHENSIVE METABOLIC PANEL
ALT: 18 U/L (ref 0–44)
AST: 54 U/L — ABNORMAL HIGH (ref 15–41)
Albumin: 2.2 g/dL — ABNORMAL LOW (ref 3.5–5.0)
Alkaline Phosphatase: 176 U/L — ABNORMAL HIGH (ref 38–126)
Anion gap: 12 (ref 5–15)
BUN: <5 mg/dL — ABNORMAL LOW (ref 8–23)
CO2: 21 mmol/L — ABNORMAL LOW (ref 22–32)
Calcium: 8.2 mg/dL — ABNORMAL LOW (ref 8.9–10.3)
Chloride: 107 mmol/L (ref 98–111)
Creatinine, Ser: 0.47 mg/dL (ref 0.44–1.00)
GFR, Estimated: >60 mL/min (ref >60)
Glucose, Bld: 81 mg/dL (ref 70–99)
Potassium: 3.4 mmol/L — ABNORMAL LOW (ref 3.5–5.1)
Sodium: 140 mmol/L (ref 135–145)
Total Bilirubin: 2.1 mg/dL — ABNORMAL HIGH (ref 0.3–1.2)
Total Protein: 4.1 g/dL — ABNORMAL LOW (ref 6.5–8.1)

## 2020-05-26 MED ORDER — POTASSIUM CHLORIDE 10 MEQ/50ML IV SOLN
10.0000 meq | INTRAVENOUS | Status: AC
  Administered 2020-05-26 (×6): 10 meq via INTRAVENOUS
  Filled 2020-05-26 (×6): qty 50

## 2020-05-26 MED ORDER — KETOROLAC TROMETHAMINE 15 MG/ML IJ SOLN
15.0000 mg | Freq: Three times a day (TID) | INTRAMUSCULAR | Status: AC | PRN
  Administered 2020-05-26 – 2020-05-31 (×3): 15 mg via INTRAVENOUS
  Filled 2020-05-26 (×4): qty 1

## 2020-05-26 MED ORDER — POTASSIUM CHLORIDE 10 MEQ/100ML IV SOLN: 10.0000 meq | INTRAVENOUS | Status: DC

## 2020-05-26 MED ORDER — VITAMIN B-6 100 MG PO TABS
100.0000 mg | ORAL_TABLET | Freq: Every day | ORAL | Status: DC
  Administered 2020-05-26 – 2020-05-31 (×6): 100 mg via ORAL
  Filled 2020-05-26 (×6): qty 1

## 2020-05-26 NOTE — Plan of Care (Signed)
  Problem: Education: Goal: Knowledge of General Education information will improve Description: Including pain rating scale, medication(s)/side effects and non-pharmacologic comfort measures Outcome: Progressing   Problem: Activity: Goal: Risk for activity intolerance will decrease Outcome: Progressing   Problem: Coping: Goal: Level of anxiety will decrease Outcome: Progressing   Problem: Elimination: Goal: Will not experience complications related to bowel motility Outcome: Progressing Goal: Will not experience complications related to urinary retention Outcome: Progressing

## 2020-05-26 NOTE — TOC Progression Note (Signed)
Transition of Care [facility]) - Progression Note    Patient Details  Name: Brooke [name] MRN: [id number] Date of Birth: 26-Nov-1950  [facility] CM/SW Contact  [name], [place] Phone Number: 05/26/2020, 11:04 PM  Clinical Narrative:     CSW was informed by [place] that patien tis denied for SNF.  CSW updated attending physician and bedside nurse. CSW received a phone call this morning that patient was denied by insurance company for SNF placement.  CSW contacted patient's HCPOA due to patient sleeping when call was received.  CSW spoke to patient's HCPOA and informed her of the options since insurance denied patient for SNF placement.  CSW told her they can appeal the discharge to the insurance company to try to get the decision overturned, CSW provided her with the phone number to call for an expedited appeal.  CSW also informed her the next option is to have patient pay privately for SNF placement, CSW explained how [name] would have to discuss with patient what the cost will be.  CSW then said patient could go home with home health.  CSW informed her that it would not be 24 hour care givers, but at least she would have some help in the home.  Finally the other option is patient could go home with hospice services, but would not be able to receive therapy.  Patient was upset with the different options, and she could not understand why she was denied.  CSW explained to her in detail the reasons, and that appealing the decision would be a good plan.  CSW explained to patient that these are only options, patient was not happy about the decision, CSW tried to explain to her these are the only options.  CSW was asked by patient's HCPOA if there is a way to get financial health care or try to get approved by Medicaid any quicker in case patient needs SNF LTC.  CSW provide HCPOA with [employer] firm contact phone number.  CSW discussed with patient that she should do the  expedited appeal, because sometimes when the team that reviews the appeals here's from the patient what her prior level of functioning is it can help overturn the decision.  CSW informed patient and her friend to contact the appeal service as soon as possible because of the time frame that patient has is limited.  Patient stated she will call in the morning because she needed to take time to process everything.  CSW discussed with patient that she can look at paying for in home care providers, and explained there would be a cost, and she would have to pay privately.  CSW to follow up with patient tomorrow to discuss discharge planning.   Expected Discharge Plan: Skilled Nursing Facility Barriers to Discharge: Continued Medical Work up  Expected Discharge Plan and Services Expected Discharge Plan: [place]   Discharge Planning Services: CM Consult   Living arrangements for the past 2 months: [place]                                       Social Determinants of Health (SDOH) Interventions    Readmission Risk Interventions Readmission Risk Prevention Plan 05/04/2020  Transportation Screening Complete  PCP or Specialist Appt within 3-5 Days Complete  HRI or [place] Complete  Social Work Consult for [place] Planning/Counseling Complete  Palliative  Care Screening Complete  Medication Review (RN Care Manager) Complete  Some recent data might be hidden

## 2020-05-26 NOTE — Plan of Care (Signed)
  Problem: Nutrition: Goal: Adequate nutrition will be maintained Outcome: Progressing   Problem: Coping: Goal: Level of anxiety will decrease Outcome: Progressing   Problem: Pain Managment: Goal: General experience of comfort will improve Outcome: Progressing   

## 2020-05-26 NOTE — Progress Notes (Signed)
PROGRESS NOTE    BRIN RUGGERIO  DJS:[id number] DOB: 1950/11/17 DOA: 05/20/2020 PCP: [name], MD    Brief Narrative:  70 y.o. female with medical history significant for HTN, metastatic breast cancer. She has been receiving chemotherapy and is followed by Dr. [name]. Patient has had progressive weakness is currently residing at a nursing facility. Patient had outpatient laboratory tests on March 13 and results reported yesterday and she was noted to have undectable vitamin B1 levels. Patient's B12 level is also decreased. Patient was called with the result and instructed to come to the ED because she needs IV replacement therapy. Dr. [name] called the ED and stated the patient needs 500 mg of IV thiamine 3 times daily for the next 3 days. She was also reportedly seen by neurology recently for intermittent confusion and neurology recommended the IV thiamine replacement protocol  Assessment & Plan:   Principal Problem:   Thiamine deficiency Active Problems:   Metastatic malignant neoplasm (HCC)   Hypokalemia   Anemia, chronic disease   Essential hypertension  Principal Problem:   Thiamine deficiency with weakness -[name] is admitted per request from Hematology service, appreciate input by Dr. [name] -Presented with markedly low thiamine level, requiring IV thiamine course, now  transitioned to PO thiamine to complete replenishment -Appreciate input by PT, recs were noted for SNF -Per SW, insurance has denied return to SNF. Family appeal currently in progress. SW following   Active Problems:   Hypokalemia Improved but remains low, will replace -continue to follow electrolytes    Metastatic malignant neoplasm  Has been followed by Oncology, appreciate in put by Dr. [name]    Anemia, chronic disease Noted to be chronic and stable.  Continue to follow CBC trends    Essential hypertension Continue home dose of Cozaar as tolerated.   -BP is  currently stable and controlled  Dysphagia -Pt continues to have difficulty with tolerating PO intake -Cleared for regular diet with thin liquids per SLP -Continue to encourage PO as tolerated  Moderate protein calorie malnutrition -minimal PO intake recently -Pt reports not tolerating PO intake recently -Continue to encourage PO as tolerated   DVT prophylaxis: Lovenox subq Code Status: DNR Family Communication: Pt in room, family not at bedside  Status is: Inpatient  Remains inpatient appropriate because:IV treatments appropriate due to intensity of illness or inability to take PO  Dispo: The patient is from: SNF              Anticipated d/c is to: SNF              Patient currently is not medically stable to d/c.   Difficult to place patient No   Consultants:   Oncology  Palliative Care  Procedures:     Antimicrobials: Anti-infectives (From admission, onward)   None      Subjective: Feeling weak but is very motivated and eager to return to SNF  Objective: Vitals:   05/25/20 1214 05/25/20 2238 05/26/20 0441 05/26/20 1239  BP: (!) 142/63 137/63 (!) 143/64 122/61  Pulse: 92 93 92 90  Resp: (!) 22 20 20 20   Temp: 98 F (36.7 C) 97.7 F (36.5 C) 97.6 F (36.4 C) 98 F (36.7 C)  TempSrc: Oral  Oral Oral  SpO2: 95% 95%  97%  Weight:      Height:        Intake/Output Summary (Last 24 hours) at 05/26/2020 1434 Last data filed at 05/26/2020 0600 Gross per 24 hour  Intake 3072.64 ml  Output 725 ml  Net 2347.64 ml   Filed Weights   05/20/20 1932  Weight: 82.6 kg    Examination: General exam: Awake, laying in bed, in nad Respiratory system: Normal respiratory effort, no wheezing Cardiovascular system: regular rate, s1, s2 Gastrointestinal system: Soft, nondistended, positive BS Central nervous system: CN2-12 grossly intact, strength intact Extremities: Perfused, no clubbing Skin: Normal skin turgor, no notable skin lesions seen Psychiatry: Mood  normal // no visual hallucinations   Data Reviewed: I have personally reviewed following labs and imaging studies  CBC: Recent Labs  Lab 05/20/20 1955 05/21/20 0530 05/22/20 0401 05/25/20 0305  WBC 6.3 6.1 5.6 4.3  NEUTROABS 5.4  --   --   --   HGB 8.7* 8.8* 8.3* 8.3*  HCT 27.3* 27.6* 26.3* 26.9*  MCV 96.1 96.8 98.1 100.7*  PLT 197 197 187 016   Basic Metabolic Panel: Recent Labs  Lab 05/20/20 1955 05/21/20 0530 05/22/20 0401 05/23/20 0433 05/24/20 0323 05/25/20 0305 05/26/20 0422  NA 140   < > 138 136 137 137 140  K 2.7*   < > 3.2* 3.6 3.9 3.6 3.4*  CL 100   < > 102 103 104 107 107  CO2 27   < > 25 23 22 22  21*  GLUCOSE 82   < > 86 85 78 82 81  BUN 7*   < > 7* 7* 6* 6* <5*  CREATININE 0.53   < > 0.54 0.48 0.49 0.36* 0.47  CALCIUM 8.0*   < > 7.7* 7.8* 7.9* 8.0* 8.2*  MG 1.9  --   --  1.9  --  1.7  --    < > = values in this interval not displayed.   GFR: Estimated Creatinine Clearance: 76.3 mL/min (by C-G formula based on SCr of 0.47 mg/dL). Liver Function Tests: Recent Labs  Lab 05/22/20 0401 05/23/20 0433 05/24/20 0323 05/25/20 0305 05/26/20 0422  AST 47* 48* 49* 50* 54*  ALT 20 18 17 16 18   ALKPHOS 174* 174* 182* 174* 176*  BILITOT 1.6* 1.9* 2.0* 2.0* 2.1*  PROT 4.1* 4.1* 4.1* 3.9* 4.1*  ALBUMIN 2.2* 2.2* 2.2* 2.1* 2.2*   No results for input(s): LIPASE, AMYLASE in the last 168 hours. No results for input(s): AMMONIA in the last 168 hours. Coagulation Profile: No results for input(s): INR, PROTIME in the last 168 hours. Cardiac Enzymes: No results for input(s): CKTOTAL, CKMB, CKMBINDEX, TROPONINI in the last 168 hours. BNP (last 3 results) No results for input(s): PROBNP in the last 8760 hours. HbA1C: No results for input(s): HGBA1C in the last 72 hours. CBG: No results for input(s): GLUCAP in the last 168 hours. Lipid Profile: No results for input(s): CHOL, HDL, LDLCALC, TRIG, CHOLHDL, LDLDIRECT in the last 72 hours. Thyroid Function  Tests: No results for input(s): TSH, T4TOTAL, FREET4, T3FREE, THYROIDAB in the last 72 hours. Anemia Panel: No results for input(s): VITAMINB12, FOLATE, FERRITIN, TIBC, IRON, RETICCTPCT in the last 72 hours. Sepsis Labs: No results for input(s): PROCALCITON, LATICACIDVEN in the last 168 hours.  Recent Results (from the past 240 hour(s))  SARS CORONAVIRUS 2 (TAT 6-24 HRS) Nasopharyngeal Nasopharyngeal Swab     Status: None   Collection Time: 05/20/20 10:01 PM   Specimen: Nasopharyngeal Swab  Result Value Ref Range Status   SARS Coronavirus 2 NEGATIVE NEGATIVE Final    Comment: (NOTE) SARS-CoV-2 target nucleic acids are NOT DETECTED.  The SARS-CoV-2 RNA is generally detectable in upper and lower  respiratory specimens during the acute phase of infection. Negative results do not preclude SARS-CoV-2 infection, do not rule out co-infections with other pathogens, and should not be used as the sole basis for treatment or other patient management decisions. Negative results must be combined with clinical observations, patient history, and epidemiological information. The expected result is Negative.  Fact Sheet for Patients: SugarRoll.be  Fact Sheet for Healthcare Providers: https://www.woods-mathews.com/  This test is not yet approved or cleared by the [place] FDA and  has been authorized for detection and/or diagnosis of SARS-CoV-2 by FDA under an Emergency Use Authorization (EUA). This EUA will remain  in effect (meaning this test can be used) for the duration of the COVID-19 declaration under Se ction 564(b)(1) of the Act, 21 U.S.C. section 360bbb-3(b)(1), unless the authorization is terminated or revoked sooner.  Performed at [facility], [place] [address] [place], [place] [address]   SARS CORONAVIRUS 2 (TAT 6-24 HRS) Nasopharyngeal Nasopharyngeal Swab     Status: None   Collection Time: 05/23/20  1:24 PM   Specimen:  Nasopharyngeal Swab  Result Value Ref Range Status   SARS Coronavirus 2 NEGATIVE NEGATIVE Final    Comment: (NOTE) SARS-CoV-2 target nucleic acids are NOT DETECTED.  The SARS-CoV-2 RNA is generally detectable in upper and lower respiratory specimens during the acute phase of infection. Negative results do not preclude SARS-CoV-2 infection, do not rule out co-infections with other pathogens, and should not be used as the sole basis for treatment or other patient management decisions. Negative results must be combined with clinical observations, patient history, and epidemiological information. The expected result is Negative.  Fact Sheet for Patients: SugarRoll.be  Fact Sheet for Healthcare Providers: https://www.woods-mathews.com/  This test is not yet approved or cleared by the [place] FDA and  has been authorized for detection and/or diagnosis of SARS-CoV-2 by FDA under an Emergency Use Authorization (EUA). This EUA will remain  in effect (meaning this test can be used) for the duration of the COVID-19 declaration under Se ction 564(b)(1) of the Act, 21 U.S.C. section 360bbb-3(b)(1), unless the authorization is terminated or revoked sooner.  Performed at [facility], [place] [address] [place], [place] [address]      Radiology Studies: No results found.  Scheduled Meds: . anastrozole  1 mg Oral Daily  . Chlorhexidine Gluconate Cloth  6 each Topical Daily  . dronabinol  2.5 mg Oral QAC lunch  . enoxaparin (LOVENOX) injection  40 mg Subcutaneous Q24H  . losartan  12.5 mg Oral Daily  . multivitamin  15 mL Oral Daily  . vitamin B-6  100 mg Oral Daily  . sodium chloride flush  10-40 mL Intracatheter Q12H  . thiamine  100 mg Oral Daily   Continuous Infusions: . sodium chloride 100 mL/hr at 05/26/20 0341  . potassium chloride 10 mEq (05/26/20 1238)     LOS: 6 days   Marylu Lund, MD [facility] Pager On  Amion  If 7PM-7AM, please contact night-coverage 05/26/2020, 2:34 PM

## 2020-05-27 ENCOUNTER — Telehealth: Payer: Self-pay

## 2020-05-27 DIAGNOSIS — E519 Thiamine deficiency, unspecified: Secondary | ICD-10-CM | POA: Diagnosis not present

## 2020-05-27 LAB — MAGNESIUM: Magnesium: 1.7 mg/dL (ref 1.7–2.4)

## 2020-05-27 LAB — CBC
HCT: 26.8 % — ABNORMAL LOW (ref 36.0–46.0)
Hemoglobin: 8.4 g/dL — ABNORMAL LOW (ref 12.0–15.0)
MCH: 31.9 pg (ref 26.0–34.0)
MCHC: 31.3 g/dL (ref 30.0–36.0)
MCV: 101.9 fL — ABNORMAL HIGH (ref 80.0–100.0)
Platelets: 186 10*3/uL (ref 150–400)
RBC: 2.63 MIL/uL — ABNORMAL LOW (ref 3.87–5.11)
RDW: 27.3 % — ABNORMAL HIGH (ref 11.5–15.5)
WBC: 4.4 10*3/uL (ref 4.0–10.5)
nRBC: 0.7 % — ABNORMAL HIGH (ref 0.0–0.2)

## 2020-05-27 LAB — COMPREHENSIVE METABOLIC PANEL
ALT: 18 U/L (ref 0–44)
AST: 59 U/L — ABNORMAL HIGH (ref 15–41)
Albumin: 2.2 g/dL — ABNORMAL LOW (ref 3.5–5.0)
Alkaline Phosphatase: 183 U/L — ABNORMAL HIGH (ref 38–126)
Anion gap: 12 (ref 5–15)
BUN: <5 mg/dL — ABNORMAL LOW (ref 8–23)
CO2: 19 mmol/L — ABNORMAL LOW (ref 22–32)
Calcium: 8.3 mg/dL — ABNORMAL LOW (ref 8.9–10.3)
Chloride: 109 mmol/L (ref 98–111)
Creatinine, Ser: 0.4 mg/dL — ABNORMAL LOW (ref 0.44–1.00)
GFR, Estimated: >60 mL/min (ref >60)
Glucose, Bld: 79 mg/dL (ref 70–99)
Potassium: 3.5 mmol/L (ref 3.5–5.1)
Sodium: 140 mmol/L (ref 135–145)
Total Bilirubin: 2.6 mg/dL — ABNORMAL HIGH (ref 0.3–1.2)
Total Protein: 4 g/dL — ABNORMAL LOW (ref 6.5–8.1)

## 2020-05-27 MED ORDER — PALBOCICLIB 75 MG PO TABS
75.0000 mg | ORAL_TABLET | Freq: Every day | ORAL | Status: DC
  Administered 2020-05-27 – 2020-05-30 (×4): 75 mg via ORAL

## 2020-05-27 NOTE — Progress Notes (Signed)
Physical Therapy Treatment Patient Details Name: Brooke [name] MRN: [id number] DOB: Jan 08, 1951 Today's Date: 05/27/2020    History of Present Illness Pt is 70 y.o. female admitted on 05/20/20 with thiamine deficiency and hypokalemia.  Pt has been receiving chemotherapy. She had recent hospital admission and went to SNF for therapy.  Pt with medical history significant for HTN, metastatic breast cancer.    PT Comments    Pt very receptive to therapy today. She was worried about a lot of other things she is having to make decisions in her life, and stated it has been a hard day, but she really knows therapy is important and she wants to try.   Pt has quiet a bit of pitting edema in B UE and LES making even harder to independently move her extremities to help promote self movement. We discussed the importance of our own muscle pump and educated with alternating quad and gluts squeezes and ankle pumps for hourly activity.   She worked hard today with bed exercises and sitting edge of bed with more independent sitting balance today and for longer period of time and tolerance. Worked with lift offs to promote beginning steps of sit to stand. Pt is extremely weak and this was a great session for her today. She was very thankful.     Follow Up Recommendations  SNF     Equipment Recommendations       Recommendations for Other Services       Precautions / Restrictions Precautions Precautions: Fall Precaution Comments: anxiety; pre-medicate if possible    Mobility  Bed Mobility Overal bed mobility: Needs Assistance Bed Mobility: Rolling;Supine to Sit;Sit to Supine Rolling: Min assist (cues and assistance for getting LEs in hooklying, and cues to reach across and use rail, but able to help with rolling R/L today.) Sidelying to sit: Max assist;Mod assist   Sit to supine: Max assist   General bed mobility comments: required cues for progress LEs to EOB, and then assist for LEs and upper  boday as well as assist with scooting hips. Pt was participating and helping along each step, seeing some progress from last time especially with the supine, side to sit component.    Transfers Overall transfer level:  (worked on sitting EOB with activities in sitting for 15 mintues including "lift offs " pushing through BLE and arms to begin initial component of sit to stand. Completed these 7 times with also lateral shift to get closer to head of bed.)               General transfer comment: see note above did begin intial component of "lifts offs og glut area " of transfers. 2 person assist using chuck pad under the patient to help assist about 80-100% clearnace each time with 1-3 second hold. No device sused for this, however was getting some intiation of B LEs for weight bearing.  Ambulation/Gait                 Stairs             Wheelchair Mobility    Modified Rankin (Stroke Patients Only)       Balance Overall balance assessment: Needs assistance Sitting-balance support: Feet supported;Bilateral upper extremity supported;No upper extremity supported (was able to do some activities without using B Ue for sitting balance and able to correct self to upright sitting today. trunk strength improving.) Sitting balance-Leahy Scale: Fair Sitting balance - Comments: was able to sit for 15  minute interval and at least 10 minutes was without UE support. improved sitting balance and strength today.                                    Cognition Arousal/Alertness: Awake/alert Behavior During Therapy: Anxious Overall Cognitive Status: Within Functional Limits for tasks assessed                                        Exercises General Exercises - Lower Extremity Ankle Circles/Pumps: AROM;Both;10 reps;Supine Quad Sets: AROM;Both;10 reps;Supine Gluteal Sets: AROM;Both;Supine;10 reps Heel Slides: AAROM;Supine;20 reps Other Exercises Other  Exercises: while seated edge of bed worked on B UE elevation to help initiate trunk extension and muscles, as well as R and L later leans and then self righting. performed each 10 times.    General Comments        Pertinent Vitals/Pain Pain Assessment: 0-10 Pain Score: 3  Pain Location: Back all over , hurts, I can tell I used it , but it is tolerable. Pain Descriptors / Indicators: Grimacing;Aching Pain Intervention(s): Monitored during session;Premedicated before session    Home Living                      Prior Function            PT Goals (current goals can now be found in the care plan section) Acute Rehab PT Goals Patient Stated Goal: to be able to get up and move by myself again PT Goal Formulation: With patient Time For Goal Achievement: 06/12/20 Potential to Achieve Goals: Fair Progress towards PT goals: Progressing toward goals    Frequency    Min 2X/week      PT Plan Current plan remains appropriate    Co-evaluation              AM-PAC PT "6 Clicks" Mobility   Outcome Measure  Help needed turning from your back to your side while in a flat bed without using bedrails?: A Little Help needed moving from lying on your back to sitting on the side of a flat bed without using bedrails?: A Lot Help needed moving to and from a bed to a chair (including a wheelchair)?: Total Help needed standing up from a chair using your arms (e.g., wheelchair or bedside chair)?: Total Help needed to walk in hospital room?: Total Help needed climbing 3-5 steps with a railing? : Total 6 Click Score: 9    End of Session   Activity Tolerance: Patient tolerated treatment well Patient left: in bed;with call bell/phone within reach;with bed alarm set;with nursing/sitter in room Nurse Communication: Mobility status PT Visit Diagnosis: Difficulty in walking, not elsewhere classified (R26.2);Muscle weakness (generalized) (M62.81)     Time: 1497-0263 PT Time  Calculation (min) (ACUTE ONLY): 40 min  Charges:  $Therapeutic Exercise: 8-22 mins $Therapeutic Activity: 8-22 mins                     Joetta Delprado, PT, MPT Acute Rehabilitation Services Office: [phone] Pager: [phone] 05/27/2020    Clide Dales 05/27/2020, 5:18 PM

## 2020-05-27 NOTE — Progress Notes (Signed)
PROGRESS NOTE    Brooke [name]  CZY:[id number] DOB: May 30, 1950 DOA: 05/20/2020 PCP: [name], MD    Brief Narrative:  70 y.o. female with medical history significant for HTN, metastatic breast cancer. She has been receiving chemotherapy and is followed by Dr. [name]. Patient has had progressive weakness was residing at a nursing facility. Patient had outpatient laboratory tests on March 13 and results reported and she was noted to have undectable vitamin B1 levels. Patient's B12 level is also decreased. Patient was called with the result and instructed to come to the ED because she needs IV replacement therapy. Dr. [name] called the ED and stated the patient needs 500 mg of IV thiamine 3 times daily for the next 3 days. She was also reportedly seen by neurology recently for intermittent confusion and neurology recommended the IV thiamine replacement protocol  Assessment & Plan:   Principal Problem:   Thiamine deficiency Active Problems:   Metastatic malignant neoplasm (HCC)   Hypokalemia   Anemia, chronic disease   Essential hypertension  Principal Problem:   Thiamine deficiency with weakness -Brooke [name] is admitted per request from Hematology service, appreciate input by Dr. [name] -Presented with markedly low thiamine level, requiring IV thiamine course, now  transitioned to PO thiamine to complete replenishment -Appreciate input by PT, recs were noted for SNF -Per SW, insurance has denied return to SNF. Family appeal currently in progress.  TOC team and Dr. [name] assisting.   Active Problems:   Hypokalemia Improved.  Follow periodically.    Metastatic malignant neoplasm  Has been followed by Oncology, appreciate in put by Dr. [name].  Discussed with Dr. [name] this morning who attempted to reach the insurance company without success.    Anemia, chronic disease Noted to be chronic and stable.  Continue to follow CBC periodically.     Essential hypertension Continue home dose of Cozaar as tolerated.   -BP is currently stable and controlled  Dysphagia -Pt continues to have difficulty with tolerating PO intake -Cleared for regular diet with thin liquids per SLP -Continue to encourage PO as tolerated  Moderate protein calorie malnutrition -minimal PO intake recently -Pt reports not tolerating PO intake recently -Continue to encourage PO as tolerated   DVT prophylaxis: Lovenox subq Code Status: DNR Family Communication: Pt in room, family not at bedside  Status is: Inpatient  Remains inpatient appropriate because:IV treatments appropriate due to intensity of illness or inability to take PO  Dispo: The patient is from: SNF              Anticipated d/c is to: SNF              Patient currently is medically optimized for DC pending SNF decision which is under appeal by patient due to insurance denial even after P2P.   Difficult to place patient No   Consultants:   Oncology  Palliative Care  Procedures:     Antimicrobials: Anti-infectives (From admission, onward)   None      Subjective: Patient distraught that the insurance has denied her SNF admission.  Reports that she trusts Dr. [name] who is helping her with the appeal.  Stated that she was up with therapy 2 days ago.  No other complaints reported  Objective: Vitals:   05/26/20 2054 05/27/20 0502 05/27/20 1332 05/27/20 1444  BP: (!) 144/63 (!) 144/66 133/71   Pulse: (!) 101 100 96   Resp: 20 (!) 22 (!) 27 20  Temp: 98.1 F (36.7 C) (!)  97.5 F (36.4 C) (!) 97.4 F (36.3 C)   TempSrc:   Oral   SpO2: 95% 95% 96%   Weight:      Height:        Intake/Output Summary (Last 24 hours) at 05/27/2020 1947 Last data filed at 05/27/2020 1755 Gross per 24 hour  Intake 10 ml  Output --  Net 10 ml   Filed Weights   05/20/20 1932  Weight: 82.6 kg    Examination: General exam: Middle-age female, moderately built, poorly nourished,  chronically ill looking, lying comfortably propped up in bed. Respiratory system: Clear to auscultation.  No increased work of breathing. Cardiovascular system: S1 and S2 heard, RRR.  No JVD, murmurs or pedal edema.  Telemetry personally reviewed: Sinus rhythm.  Brief 20-minute episode of nonsustained SVT last night at 9:20 PM. Gastrointestinal system: Soft, nondistended, positive BS Central nervous system: CN2-12 grossly intact, strength intact Extremities: Perfused, no clubbing.  Grade 4 x 5 power in upper extremities.  Great 3 x 5 power in lower extremities. Skin: Normal skin turgor, no notable skin lesions seen Psychiatry: Mood normal // no visual hallucinations   Data Reviewed: I have personally reviewed following labs and imaging studies  CBC: Recent Labs  Lab 05/20/20 1955 05/21/20 0530 05/22/20 0401 05/25/20 0305 05/27/20 0339  WBC 6.3 6.1 5.6 4.3 4.4  NEUTROABS 5.4  --   --   --   --   HGB 8.7* 8.8* 8.3* 8.3* 8.4*  HCT 27.3* 27.6* 26.3* 26.9* 26.8*  MCV 96.1 96.8 98.1 100.7* 101.9*  PLT 197 197 187 188 737   Basic Metabolic Panel: Recent Labs  Lab 05/20/20 1955 05/21/20 0530 05/23/20 0433 05/24/20 0323 05/25/20 0305 05/26/20 0422 05/27/20 0339  NA 140   < > 136 137 137 140 140  K 2.7*   < > 3.6 3.9 3.6 3.4* 3.5  CL 100   < > 103 104 107 107 109  CO2 27   < > 23 22 22  21* 19*  GLUCOSE 82   < > 85 78 82 81 79  BUN 7*   < > 7* 6* 6* <5* <5*  CREATININE 0.53   < > 0.48 0.49 0.36* 0.47 0.40*  CALCIUM 8.0*   < > 7.8* 7.9* 8.0* 8.2* 8.3*  MG 1.9  --  1.9  --  1.7  --  1.7   < > = values in this interval not displayed.   GFR: Estimated Creatinine Clearance: 76.3 mL/min (A) (by C-G formula based on SCr of 0.4 mg/dL (L)). Liver Function Tests: Recent Labs  Lab 05/23/20 0433 05/24/20 0323 05/25/20 0305 05/26/20 0422 05/27/20 0339  AST 48* 49* 50* 54* 59*  ALT 18 17 16 18 18   ALKPHOS 174* 182* 174* 176* 183*  BILITOT 1.9* 2.0* 2.0* 2.1* 2.6*  PROT 4.1* 4.1*  3.9* 4.1* 4.0*  ALBUMIN 2.2* 2.2* 2.1* 2.2* 2.2*    Recent Results (from the past 240 hour(s))  SARS CORONAVIRUS 2 (TAT 6-24 HRS) Nasopharyngeal Nasopharyngeal Swab     Status: None   Collection Time: 05/20/20 10:01 PM   Specimen: Nasopharyngeal Swab  Result Value Ref Range Status   SARS Coronavirus 2 NEGATIVE NEGATIVE Final    Comment: (NOTE) SARS-CoV-2 target nucleic acids are NOT DETECTED.  The SARS-CoV-2 RNA is generally detectable in upper and lower respiratory specimens during the acute phase of infection. Negative results do not preclude SARS-CoV-2 infection, do not rule out co-infections with other pathogens, and should not be  used as the sole basis for treatment or other patient management decisions. Negative results must be combined with clinical observations, patient history, and epidemiological information. The expected result is Negative.  Fact Sheet for Patients: SugarRoll.be  Fact Sheet for Healthcare Providers: https://www.woods-mathews.com/  This test is not yet approved or cleared by the [place] FDA and  has been authorized for detection and/or diagnosis of SARS-CoV-2 by FDA under an Emergency Use Authorization (EUA). This EUA will remain  in effect (meaning this test can be used) for the duration of the COVID-19 declaration under Se ction 564(b)(1) of the Act, 21 U.S.C. section 360bbb-3(b)(1), unless the authorization is terminated or revoked sooner.  Performed at [facility], [place] [address], [place], [place] [address]   SARS CORONAVIRUS 2 (TAT 6-24 HRS) Nasopharyngeal Nasopharyngeal Swab     Status: None   Collection Time: 05/23/20  1:24 PM   Specimen: Nasopharyngeal Swab  Result Value Ref Range Status   SARS Coronavirus 2 NEGATIVE NEGATIVE Final    Comment: (NOTE) SARS-CoV-2 target nucleic acids are NOT DETECTED.  The SARS-CoV-2 RNA is generally detectable in upper and lower respiratory specimens  during the acute phase of infection. Negative results do not preclude SARS-CoV-2 infection, do not rule out co-infections with other pathogens, and should not be used as the sole basis for treatment or other patient management decisions. Negative results must be combined with clinical observations, patient history, and epidemiological information. The expected result is Negative.  Fact Sheet for Patients: SugarRoll.be  Fact Sheet for Healthcare Providers: https://www.woods-mathews.com/  This test is not yet approved or cleared by the [place] FDA and  has been authorized for detection and/or diagnosis of SARS-CoV-2 by FDA under an Emergency Use Authorization (EUA). This EUA will remain  in effect (meaning this test can be used) for the duration of the COVID-19 declaration under Se ction 564(b)(1) of the Act, 21 U.S.C. section 360bbb-3(b)(1), unless the authorization is terminated or revoked sooner.  Performed at [facility], [place] [address], [place], [place] [address]      Radiology Studies: No results found.  Scheduled Meds: . anastrozole  1 mg Oral Daily  . Chlorhexidine Gluconate Cloth  6 each Topical Daily  . dronabinol  2.5 mg Oral QAC lunch  . enoxaparin (LOVENOX) injection  40 mg Subcutaneous Q24H  . losartan  12.5 mg Oral Daily  . multivitamin  15 mL Oral Daily  . palbociclib  75 mg Oral Daily  . vitamin B-6  100 mg Oral Daily  . sodium chloride flush  10-40 mL Intracatheter Q12H  . thiamine  100 mg Oral Daily   Continuous Infusions: . sodium chloride 100 mL/hr at 05/26/20 0341     LOS: 7 days   Vernell Leep, MD, [place], [facility]. [facility]  To contact the attending provider between 7A-7P or the covering provider during after hours 7P-7A, please log into the web site [email or web address] and access using universal [place] password for that web site. If you do not have the password, please call the hospital  operator.

## 2020-05-27 NOTE — TOC Progression Note (Signed)
Transition of Care [facility]) - Progression Note    Patient Details  Name: Brooke [name] MRN: [id number] Date of Birth: 1950/05/16  Transition of Care [facility]) CM/SW Contact  [name], [place] Phone Number: 05/27/2020, 7:11 PM  Clinical Narrative:     CSW received a phone call from patient's friend and [name] [phone], following up on the expedited appeal.  Per [name], patient and her spoke to patient's oncologist who documented that patient does need to return to SNF to continue rehab.  Per [name], they tried calling the insurance appeal hotline, [phone] once yesterday and three times today.  They were only able to speak to someone this afternoon.  Per [name], she spoke to [name], and she said that patient's family or friend can not do the appeal, it has to be from patient, case Freight forwarder, or physician.  CSW contacted appeal hotline and spoke to [name], she stated that is incorrect information, patient or family can completed expedited appeal.  When family does it the appeal hotline has 72 hours to make a decision.  Per [name] if a case manager or physician from hospital completes the appeal there is a 24 hour turn around time.  CSW asked [name] if this CSW can start appeal, per [name] she said yes.  CSW started appeal, with [name], she requested that clinicals be faxed to [phone].  CSW faxed requested clinicals.  CSW expressed that patient is not at her baseline, and was independent with ADLs, cooking meals, driving her vehicle, not on oxygen, and did not have pressure ulcers,  prior to SNF and hospital stay.  CSW requested expedited appeal for SNF determination.  CSW attempted to update patient however she was sleeping and did not wake up.  CSW to update tomorrow.  Patient would like to return back to [name] if she is approved, CSW updated SNF on status of appeal.  Per SNF they would like patient's HCPOA to contact them and discuss cost if patient is not approved by  [facility].  CSW notified patient's HCPOA to call [name] and discuss cost of patient returning.  CSW to continue to follow patient's progress throughout discharge planning.       Expected Discharge Plan: [place] Barriers to Discharge: Continued Medical Work up  Expected Discharge Plan and Services Expected Discharge Plan: [place]   Discharge Planning Services: CM Consult   Living arrangements for the past 2 months: [place]                                       Social Determinants of Health (SDOH) Interventions    Readmission Risk Interventions Readmission Risk Prevention Plan 05/04/2020  Transportation Screening Complete  PCP or Specialist Appt within 3-5 Days Complete  HRI or [place] Complete  Social Work Consult for [place] Planning/Counseling Complete  Palliative Care Screening Complete  Medication Review [employer] photographer) Complete  Some recent data might be hidden

## 2020-05-27 NOTE — Progress Notes (Signed)
Brooke [name]   DOB:1968/09/02   BF#:[id number]   OMA#:[id number]  Subjective:  Brooke [name] is very distraught as their insurance apparently has denied her returning to rehab.  Apparently the are saying she was not making progress.  We now know that she was severely thiamine deficient and this certainly must have contributed.  She seems to be doing better with rehab here in the hospital now that her thiamine has been replaced.  Unfortunately she has been asked to request an appeal.  This is very stressful and difficult for her.  I did come in today and started the process for her and her friend [name] and I left them my beeper number so if I can be of help at any time during the day I can pop in from the clinic and help the process.  I did write down the information as I understand it at present so they can give that information to the insurance company.  Objective: white woman examined in bed Vitals:   05/26/20 2054 05/27/20 0502  BP: (!) 144/63 (!) 144/66  Pulse: (!) 101 100  Resp: 20 (!) 22  Temp: 98.1 F (36.7 C) (!) 97.5 F (36.4 C)  SpO2: 95% 95%    Body mass index is 26.89 kg/m.  Intake/Output Summary (Last 24 hours) at 05/27/2020 1311 Last data filed at 05/26/2020 1800 Gross per 24 hour  Intake 1535.89 ml  Output 300 ml  Net 1235.89 ml    CBG (last 3)  No results for input(s): GLUCAP in the last 72 hours.   Labs:  Lab Results  Component Value Date   WBC 4.4 05/27/2020   HGB 8.4 (L) 05/27/2020   HCT 26.8 (L) 05/27/2020   MCV 101.9 (H) 05/27/2020   PLT 186 05/27/2020   NEUTROABS 5.4 05/20/2020    _0 @  Urine Studies No results for input(s): UHGB, CRYS in the last 72 hours.  Invalid input(s): UACOL, UAPR, USPG, UPH, UTP, UGL, [place], UBIL, UNIT, UROB, [place], UEPI, UWBC, [place], [place], [place], [place], [place]  Basic Metabolic Panel: Recent Labs  Lab 05/20/20 1955 05/21/20 0530 05/23/20 0433 05/24/20 0323 05/25/20 0305 05/26/20 0422 05/27/20 0339  NA 140   < > 136  137 137 140 140  K 2.7*   < > 3.6 3.9 3.6 3.4* 3.5  CL 100   < > 103 104 107 107 109  CO2 27   < > _1 21* 19*  GLUCOSE 82   < > 85 78 82 81 79  BUN 7*   < > 7* 6* 6* <5* <5*  CREATININE 0.53   < > 0.48 0.49 0.36* 0.47 0.40*  CALCIUM 8.0*   < > 7.8* 7.9* 8.0* 8.2* 8.3*  MG 1.9  --  1.9  --  1.7  --  1.7   < > = values in this interval not displayed.   GFR Estimated Creatinine Clearance: 76.3 mL/min (A) (by C-G formula based on SCr of 0.4 mg/dL (L)). Liver Function Tests: Recent Labs  Lab 05/23/20 0433 05/24/20 0323 05/25/20 0305 05/26/20 0422 05/27/20 0339  AST 48* 49* 50* 54* 59*  ALT _2 ALKPHOS 174* 182* 174* 176* 183*  BILITOT 1.9* 2.0* 2.0* 2.1* 2.6*  PROT 4.1* 4.1* 3.9* 4.1* 4.0*  ALBUMIN 2.2* 2.2* 2.1* 2.2* 2.2*   No results for input(s): LIPASE, AMYLASE in the last 168 hours. No results for input(s): AMMONIA in the last 168 hours. Coagulation profile No results for input(s):  INR, PROTIME in the last 168 hours.  CBC: Recent Labs  Lab 05/20/20 1955 05/21/20 0530 05/22/20 0401 05/25/20 0305 05/27/20 0339  WBC 6.3 6.1 5.6 4.3 4.4  NEUTROABS 5.4  --   --   --   --   HGB 8.7* 8.8* 8.3* 8.3* 8.4*  HCT 27.3* 27.6* 26.3* 26.9* 26.8*  MCV 96.1 96.8 98.1 100.7* 101.9*  PLT 197 197 187 188 186   Cardiac Enzymes: No results for input(s): CKTOTAL, CKMB, CKMBINDEX, TROPONINI in the last 168 hours. BNP: Invalid input(s): POCBNP CBG: No results for input(s): GLUCAP in the last 168 hours. D-Dimer No results for input(s): DDIMER in the last 72 hours. Hgb A1c No results for input(s): HGBA1C in the last 72 hours. Lipid Profile No results for input(s): CHOL, HDL, LDLCALC, TRIG, CHOLHDL, LDLDIRECT in the last 72 hours. Thyroid function studies No results for input(s): TSH, T4TOTAL, T3FREE, THYROIDAB in the last 72 hours.  Invalid input(s): FREET3 Anemia work up No results for input(s): VITAMINB12, FOLATE, FERRITIN, TIBC, IRON, RETICCTPCT in the last  72 hours. Microbiology Recent Results (from the past 240 hour(s))  SARS CORONAVIRUS 2 (TAT 6-24 HRS) Nasopharyngeal Nasopharyngeal Swab     Status: None   Collection Time: 05/20/20 10:01 PM   Specimen: Nasopharyngeal Swab  Result Value Ref Range Status   SARS Coronavirus 2 NEGATIVE NEGATIVE Final    Comment: (NOTE) SARS-CoV-2 target nucleic acids are NOT DETECTED.  The SARS-CoV-2 RNA is generally detectable in upper and lower respiratory specimens during the acute phase of infection. Negative results do not preclude SARS-CoV-2 infection, do not rule out co-infections with other pathogens, and should not be used as the sole basis for treatment or other patient management decisions. Negative results must be combined with clinical observations, patient history, and epidemiological information. The expected result is Negative.  Fact Sheet for Patients: SugarRoll.be  Fact Sheet for Healthcare Providers: https://www.woods-mathews.com/  This test is not yet approved or cleared by the [place] FDA and  has been authorized for detection and/or diagnosis of SARS-CoV-2 by FDA under an Emergency Use Authorization (EUA). This EUA will remain  in effect (meaning this test can be used) for the duration of the COVID-19 declaration under Se ction 564(b)(1) of the Act, 21 U.S.C. section 360bbb-3(b)(1), unless the authorization is terminated or revoked sooner.  Performed at [facility], [place] [address] [place], [place] [address]   SARS CORONAVIRUS 2 (TAT 6-24 HRS) Nasopharyngeal Nasopharyngeal Swab     Status: None   Collection Time: 05/23/20  1:24 PM   Specimen: Nasopharyngeal Swab  Result Value Ref Range Status   SARS Coronavirus 2 NEGATIVE NEGATIVE Final    Comment: (NOTE) SARS-CoV-2 target nucleic acids are NOT DETECTED.  The SARS-CoV-2 RNA is generally detectable in upper and lower respiratory specimens during the acute phase of  infection. Negative results do not preclude SARS-CoV-2 infection, do not rule out co-infections with other pathogens, and should not be used as the sole basis for treatment or other patient management decisions. Negative results must be combined with clinical observations, patient history, and epidemiological information. The expected result is Negative.  Fact Sheet for Patients: SugarRoll.be  Fact Sheet for Healthcare Providers: https://www.woods-mathews.com/  This test is not yet approved or cleared by the [place] FDA and  has been authorized for detection and/or diagnosis of SARS-CoV-2 by FDA under an Emergency Use Authorization (EUA). This EUA will remain  in effect (meaning this test can be used) for the duration  of the COVID-19 declaration under Se ction 564(b)(1) of the Act, 21 U.S.C. section 360bbb-3(b)(1), unless the authorization is terminated or revoked sooner.  Performed at [facility], [place] [address], [place], [place] [address]       Studies:  No results found.  Assessment: 70 y.o. [place] woman admitted for thiamine replacement, with a history of stage IV breast cancer as follows:  (1) status post bilateral breast biopsies 01/26/2017, showing: in the right breast, a complex sclerosing lesion, status post lumpectomy 09/25/2017, with no malignancy identified.  (2) in the left breast, a cT2 pN1 invasive ductal carcinoma (with some lobular features but E-cadherin positive), grade 2, estrogen and progesterone receptor positive, HER-2 not amplified, with an MIB-1 of 15-20%             (a) breast MRI 02/04/2017 suggests a T3 N1 tumor  (3) started neoadjuvant cyclophosphamide/docetaxel 03/03/2017, discontinued after 1 cycle with very poor tolerance             (a) started cyclophosphamide/methotrexate/fluorouracil (CMF) 03/28/2017, repeated x7 cycles, last dose 08/01/2017  (4) status post left modified radical  mastectomy 09/25/17 for a ypT5 ypN2, stage IIIA invasive ductal carcinoma, grade 2, again estrogen and progesterone receptor positive, HER-2 not amplified  (5) adjuvant radiation completed 12/21/2017             (a) capecitabine sensitization tolerated only the initial week of radiation (b) Site/dose:The patient initially received a dose of 50.4 Gy in 28 fractions to the leftchest wall andleftsupraclavicular region. This was delivered using a 3-D conformal, 4 field technique. The patient then received a boost to the mastectomy scar. This delivered an additional 10 Gy in 5 fractions using an en face electron field. The total dose was 60.4 Gy.  ( 6) tamoxifen started 03/09/2018, discontinued January 2022 with metastatic progression  (7) genetics testing 12/12/2017 through the Multi-Gene Panel offered by [facility] found no deleterious mutations in AIP, ALK, APC, ATM, AXIN2,BAP1,  BARD1, BLM, BMPR1A, BRCA1, BRCA2, BRIP1, CASR, CDC73, CDH1, CDK4, CDKN1B, CDKN1C, CDKN2A (p14ARF), CDKN2A (p16INK4a), CEBPA, CHEK2, CTNNA1, DICER1, DIS3L2, EGFR (c.2369C>T, p.Thr790Met variant only), EPCAM (Deletion/duplication testing only), FH, FLCN, GATA2, GPC3, GREM1 (Promoter region deletion/duplication testing only), HOXB13 (c.251G>A, p.Gly84Glu), HRAS, KIT, MAX, MEN1, MET, MITF (c.952G>A, p.Glu318Lys variant only), MLH1, MSH2, MSH3, MSH6, MUTYH, NBN, NF1, NF2, NTHL1, PALB2, PDGFRA, PHOX2B, PMS2, POLD1, POLE, POT1, PRKAR1A, PTCH1, PTEN, RAD50, RAD51C, RAD51D, RB1, RECQL4, RET, RUNX1, SDHAF2, SDHA (sequence changes only), SDHB, SDHC, SDHD, SMAD4, SMARCA4, SMARCB1, SMARCE1, STK11, SUFU, TERC, TERT, TMEM127, TP53, TSC1, TSC2, VHL, WRN and WT1.   (8) Foundation 1 testing shows stable microsatellite status and 1 mutation/ Mb, TP53 mutations x2             (a) PD-L1 dated July 2019 negative  METASTATIC DISEASE: January 2022 (9) CT angio of the chest 02/27/2020 shows multiple hilar nodes consistent with prior granulomatous  infection, multiple small pulmonary nodules, multiple punctate granulomas throughout the liver and spleen, nodular hepatic contour and areas of mottling possibly due to steatosis, 1.6 cm left adrenal nodule             (a) PET scan 03/05/2020 shows mildly to highly hypermetabolic adenopathy, the pulmonary nodules being mildly to not hypermetabolic, a large right hepatic liver lesion with an SUV of 13.2 and additional hypermetabolic right and left lobe foci, the adrenal lesion being hypermetabolic with an SUV of 16.  There are also peritoneal nodules and innumerable foci of bony involvement.  Incidental note  was made of small heterogeneous foci of increased uptake in the cerebellum.  The CT images did not show gross abnormality in the visualized brain             (b) on 03/09/2020 CA 27-29 was 983.0, CEA 744.17, the CA 19-9 normal at 32             (c) liver biopsy 03/13/2020 confirms metastati carcinoma, estrogen receptor positive, progesterone and HER 2 negative, with an Mib-1 of 40%             (d) brain MRI 03/26/2020 shows dural involvement including a 1.0 cm dural-based cerebellar lesion  (10) paclitaxel started 03/30/2020, repeated x3, last dose 04/30/2020 w/o evidence of response  (11) zoledronate started 03/19/2020, to be repeated every 12 weeks (next dose 06/11/2020)  (12)anastrozolestarted 05/07/2020 (a) palbociclib 125 mg daily, 21 days off, 7 days off, started 05/15/2018  (13) severe thiamine depletion requiring emergent IV repletion 05/20/2020  Plan:  Brisha seems more alert, more focused, and a little bit stronger compared to about 2 weeks ago.  I think she would benefit from going back to rehab and trying to regain her independence.  At present she would not be able to return home and would need to simply be in a skilled nursing facility.  We are trying to get her insurance to agree with her returning to [facility] for further physical therapy.  The patient's family  has been distantly supportive but they are not able to help the patient in a practical way.  Her friend [name] today was helping her get through the insurance questions and concerns.  [name] had questions regarding Jazyiah's prognosis.  At this point Meylin wants further treatment and she is receiving anastrozole and palbociclib.  This can be effective.  The goal of treatment is control.  If all we get is controlled then she will remain in an SNF indefinitely not necessarily with a better quality of life than she has right now.  On the other hand she could have a good even if slow response and 3 to 6 months from now she might be able to walk some, transfer safely, and possibly transition to assisted living.  On the other hand things could deteriorate despite treatment.  My guess is that within 2 months we will have a much clearer idea of what Rutha's prognosis is.  At this point I strongly support the idea of further rehab with a view of optimizing her functional status.  We are continuing the anastrozole and palbociclib, the latter cycle 21 days on and 7 days off.  So far she is tolerating these well  Total encounter time 20 minutes.Chauncey Cruel, MD 05/27/2020  1:11 PM Medical Oncology and Hematology [facility] [address] [place], [place] [address] Tel. [phone]    Fax. [phone]

## 2020-05-27 NOTE — Progress Notes (Signed)
@  1058 Received call from tele that pt had 10 beat run SVT and HR at 150.  MD Hongalgi notified and aware. Awaiting orders. Pt asymptomatic at this time. HR at 98-99 this time. Will continue to monitor.

## 2020-05-27 NOTE — NC FL2 (Signed)
[place] LEVEL OF CARE SCREENING TOOL     IDENTIFICATION  Patient Name: Brooke [name] Birthdate: 1950-09-15 Sex: female Admission Date (Current Location): 05/20/2020  [facility] and [place] Number:  Herbalist and Address:  [facility],  [place] [address], [place]      Provider Number: [id number]  Attending Physician Name and Address:  Modena Jansky, MD  Relative Name and Phone Number:  Pearletha Forge    [phone]  [place], [place] other [name]  Phineas Semen [phone]    Current Level of Care: Hospital Recommended Level of Care: [place] Prior Approval Number:    Date Approved/Denied:   PASRR Number: [id number]  Discharge Plan: SNF    Current Diagnoses: Patient Active Problem List   Diagnosis Date Noted  . Thiamine deficiency 05/20/2020  . Hypokalemia 05/20/2020  . Anemia, chronic disease 05/20/2020  . Essential hypertension 05/20/2020  . B12 deficiency anemia 05/11/2020  . Pressure injury of skin 05/08/2020  . Malnutrition of moderate degree 05/05/2020  . Intractable vomiting with nausea 05/03/2020  . Acute lower UTI 04/21/2020  . Dehydration 04/21/2020  . Depression with anxiety 04/21/2020  . Transaminitis 04/21/2020  . Metastatic malignant neoplasm ([place])   . Nausea vomiting and diarrhea   . Intractable nausea and vomiting 04/20/2020  . PICC (peripherally inserted central catheter) in place 04/17/2020  . Bone metastases ([place]) 03/11/2020  . Liver metastases ([place]) 03/05/2020  . Lung metastases ([place]) 03/05/2020  . Postoperative state 04/23/2019  . Superficial basal cell carcinoma (BCC) 02/10/2019  . Goals of care, counseling/discussion 12/14/2017  . Family history of uterine cancer   . Family history of prostate cancer   . Melanoma in situ of right upper extremity ([place]) 07/18/2017  . Port-A-Cath in place 04/18/2017   . Drug-induced neutropenia ([place]) 03/09/2017  . Family history of breast cancer 02/02/2017  . Family history of colon cancer 02/02/2017  . Family history of prostate cancer in father 02/02/2017  . Malignant neoplasm of overlapping sites of left breast in female, estrogen receptor positive ([place]) 01/31/2017  . Hx of hot flashes, menopausal, HRT in the past 12/19/2011  . Osteopenia, stable, DEXA 2013, recs to repeat in 2018 12/19/2011  . Hx of Hyperlipidemia, LDL 144 in 07/2011 12/19/2011  . Vitamin D insufficiency 12/19/2011  . Fibroid     Orientation RESPIRATION BLADDER Height & Weight     Time,Situation,Place,Self  O2 (2L) Incontinent Weight: 182 lb 1.6 oz (82.6 kg) Height:  5\' 9"  (175.3 cm)  BEHAVIORAL SYMPTOMS/MOOD NEUROLOGICAL BOWEL NUTRITION STATUS      Incontinent Diet (Regular thin liquid diet)  AMBULATORY STATUS COMMUNICATION OF NEEDS Skin   Limited Assist Verbally PU Stage and Appropriate Care (Stage 2 on buttocks)   PU Stage 2 Dressing:  (PRN dressing change)                   Personal Care Assistance Level of Assistance  Bathing,Feeding,Dressing Bathing Assistance: Limited assistance Feeding assistance: Independent Dressing Assistance: Limited assistance     Functional Limitations Info  Sight,Hearing,Speech Sight Info: Adequate Hearing Info: Adequate Speech Info: Adequate    SPECIAL CARE FACTORS FREQUENCY  PT (By licensed PT),OT (By licensed OT)     PT Frequency: Minimum 5x a week OT Frequency: Minimum 5x a week            Contractures Contractures Info: Not present    Additional Factors Info  Code Status,Allergies Code Status Info: DNR Allergies Info: Neosporin, Bactrim, Benzalkonium Chloride   Latex           Current Medications (05/27/2020):  This is the current hospital active medication list Current Facility-Administered Medications  Medication Dose Route Frequency Provider Last Rate Last Admin  . 0.9 %  sodium chloride infusion    Intravenous Continuous Chotiner, Yevonne Aline, MD 100 mL/hr at 05/26/20 0341 New Bag at 05/26/20 0341  . acetaminophen (TYLENOL) tablet 650 mg  650 mg Oral Q6H PRN Chotiner, Yevonne Aline, MD   650 mg at 05/25/20 1136   Or  . acetaminophen (TYLENOL) suppository 650 mg  650 mg Rectal Q6H PRN Chotiner, Yevonne Aline, MD      . anastrozole (ARIMIDEX) tablet 1 mg  1 mg Oral Daily Chotiner, Yevonne Aline, MD   1 mg at 05/27/20 0958  . Chlorhexidine Gluconate Cloth 2 % PADS 6 each  6 each Topical Daily Chotiner, Yevonne Aline, MD   6 each at 05/27/20 [phone]  . dronabinol (MARINOL) capsule 2.5 mg  2.5 mg Oral QAC lunch Chotiner, Yevonne Aline, MD   2.5 mg at 05/26/20 1043  . enoxaparin (LOVENOX) injection 40 mg  40 mg Subcutaneous Q24H Chotiner, Yevonne Aline, MD   40 mg at 05/27/20 0958  . ketorolac (TORADOL) 15 MG/ML injection 15 mg  15 mg Intravenous Q8H PRN Donne Hazel, MD   15 mg at 05/26/20 1128  . losartan (COZAAR) tablet 12.5 mg  12.5 mg Oral Daily Chotiner, Yevonne Aline, MD   12.5 mg at 05/27/20 0957  . multivitamin liquid 15 mL  15 mL Oral Daily Altamese, Deguire, RPH      . ondansetron [facility]) tablet 4 mg  4 mg Oral Q6H PRN Chotiner, Yevonne Aline, MD       Or  . ondansetron [facility]) injection 4 mg  4 mg Intravenous Q6H PRN Chotiner, Yevonne Aline, MD   4 mg at 05/24/20 1013  . oxyCODONE-acetaminophen (PERCOCET/ROXICET) 5-325 MG per tablet 1-2 tablet  1-2 tablet Oral Q4H PRN Donne Hazel, MD   1 tablet at 05/27/20 1033  . palbociclib (IBRANCE) tablet 75 mg  75 mg Oral Daily Hongalgi, Anand D, MD      . prochlorperazine (COMPAZINE) tablet 10 mg  10 mg Oral Q6H PRN Chotiner, Yevonne Aline, MD      . pyridOXINE (VITAMIN B-6) tablet 100 mg  100 mg Oral Daily Donne Hazel, MD   100 mg at 05/27/20 9449  . sodium chloride flush (NS) 0.9 % injection 10-40 mL  10-40 mL Intracatheter Q12H Chotiner, Yevonne Aline, MD   10 mL at 05/27/20 0959  . sodium chloride flush (NS) 0.9 % injection 10-40 mL  10-40 mL Intracatheter PRN Chotiner, Yevonne Aline,  MD   10 mL at 05/27/20 1755  . thiamine tablet 100 mg  100 mg Oral Daily Magrinat, Virgie Dad, MD   100 mg at 05/27/20 6759     Discharge Medications: Please see discharge summary for a list of discharge medications.  Relevant Imaging Results:  Relevant Lab Results:   Additional Information SSN [id number]  [name], LCSW

## 2020-05-27 NOTE — Telephone Encounter (Signed)
Pt called stating she is in the hospital and needs immediate assistance "with a situation regarding my insurance". Pt states she spoke with Dr [name] 05/27/19 and he knows the importance of this matter, but refuses to tell this nurse how I can help. Explained to pt that Dr [name] is in clinic today and as soon as he gets a chance we will be back in touch. Pt verbalized thanks and understanding.

## 2020-05-27 NOTE — TOC Progression Note (Signed)
Transition of Care [facility]) - Progression Note    Patient Details  Name: EOWYN TABONE MRN: [id number] Date of Birth: 05/11/1950  [facility] CM/SW Contact  [name], [place] Phone Number: 05/27/2020, 9:51 AM  Clinical Narrative:    Patient is appealing the denial by the insurance company.  CSW also contacted PT to see if they can see her today.  Expected Discharge Plan: [place] Barriers to Discharge: Continued Medical Work up  Expected Discharge Plan and Services Expected Discharge Plan: [place]   Discharge Planning Services: CM Consult   Living arrangements for the past 2 months: [place]                                       Social Determinants of Health (SDOH) Interventions    Readmission Risk Interventions Readmission Risk Prevention Plan 05/04/2020  Transportation Screening Complete  PCP or Specialist Appt within 3-5 Days Complete  HRI or [place] Complete  Social Work Consult for [place] Planning/Counseling Complete  Palliative Care Screening Complete  Medication Review Press photographer) Complete  Some recent data might be hidden

## 2020-05-28 ENCOUNTER — Other Ambulatory Visit (HOSPITAL_COMMUNITY): Payer: Self-pay

## 2020-05-28 DIAGNOSIS — E519 Thiamine deficiency, unspecified: Secondary | ICD-10-CM | POA: Diagnosis not present

## 2020-05-28 MED ORDER — PROSOURCE PLUS PO LIQD
30.0000 mL | Freq: Every day | ORAL | Status: DC
  Filled 2020-05-28: qty 30

## 2020-05-28 MED ORDER — BOOST / RESOURCE BREEZE PO LIQD CUSTOM: 1.0000 | ORAL | Status: DC

## 2020-05-28 MED ORDER — ENSURE ENLIVE PO LIQD
237.0000 mL | ORAL | Status: DC
  Administered 2020-05-28: 237 mL via ORAL

## 2020-05-28 NOTE — Progress Notes (Signed)
Nutrition Follow-up  RD working remotely.  DOCUMENTATION CODES:   Non-severe (moderate) malnutrition in context of chronic illness  INTERVENTION:  - will order Boost Breeze once/day, each supplement provides 250 kcal and 9 grams of protein. - will order Ensure Enlive once/day, each supplement provides 350 kcal and 20 grams of protein. - will order 30 ml Prosource Plus once/day, each supplement provides 100 kcal and 15 grams protein.  - weigh patient today.    NUTRITION DIAGNOSIS:   Moderate Malnutrition related to chronic illness,cancer and cancer related treatments as evidenced by mild fat depletion,mild muscle depletion,moderate muscle depletion -ongoing  GOAL:   Patient will meet greater than or equal to 90% of their needs -unmet  MONITOR:   PO intake,Supplement acceptance,Labs,Weight trends,Skin  ASSESSMENT:   70 y.o. female with medical history of HTN and metastatic breast cancer. She has been receiving chemotherapy. Patient has had progressive weakness and is residing at a nursing facility.  Patient had outpatient laboratory tests on 3/13 and results on 3/23 showed undectable vitamin B1 levels.  Patient's B12 level is also decreased.  Patient continues on Regular, thin liquids diet following SLP evaluation on 3/26. Most recently documented intakes were 0% of breakfast and 0% of lunch on 3/27; 0% of breakfast and 5% of lunch on 3/28.   Palliative Care is following patient and note on 3/28 outlines that patient does not want a feeding tube.  She has not been weighed since admission on 3/23.     Labs reviewed; BUN: <5 mg/dl, creatinine: 0.4 mg/dl, Ca: 8.3 mg/dl, Alk Phos elevated.  Medications reviewed; 2.5 mg marinol/day started 3/24, 15 ml multivitamin/day, 100 mg oral thiamine/day, 100 mg vitamin B-6/day.    Diet Order:   Diet Order            Diet regular Room service appropriate? Yes; Fluid consistency: Thin  Diet effective now                 EDUCATION  NEEDS:   Not appropriate for education at this time  Skin:  Skin Assessment: Skin Integrity Issues: Skin Integrity Issues:: Stage II Stage II: L buttocks  Last BM:  3/30 (type 6 x1)  Height:   Ht Readings from Last 1 Encounters:  05/20/20 _0  (1.753 m)    Weight:   Wt Readings from Last 1 Encounters:  05/20/20 82.6 kg     Estimated Nutritional Needs:  Kcal:  2000-2200 kcal Protein:  100-115 grams Fluid:  >/= 2 L/day     Jarome Matin, MS, RD, LDN, CNSC Inpatient Clinical Dietitian RD pager # available in [facility]  After hours/weekend pager # available in [facility]

## 2020-05-28 NOTE — Progress Notes (Signed)
PROGRESS NOTE    Brooke [name]  PFX:[id number] DOB: 11-Feb-1951 DOA: 05/20/2020 PCP: [name], MD    Brief Narrative:  70 y.o. female with medical history significant for HTN, metastatic breast cancer. She has been receiving chemotherapy and is followed by Dr. [name]. Patient has had progressive weakness was residing at a nursing facility. Patient had outpatient laboratory tests on March 13 and results reported and she was noted to have undectable vitamin B1 levels. Patient's B12 level is also decreased. Patient was called with the result and instructed to come to the ED because she needs IV replacement therapy. Dr. [name] called the ED and stated the patient needs 500 mg of IV thiamine 3 times daily for the next 3 days. She was also reportedly seen by neurology recently for intermittent confusion and neurology recommended the IV thiamine replacement protocol  Assessment & Plan:   Principal Problem:   Thiamine deficiency Active Problems:   Metastatic malignant neoplasm (HCC)   Hypokalemia   Anemia, chronic disease   Essential hypertension  Principal Problem:   Thiamine deficiency with weakness -[name] is admitted per request from Hematology service, appreciate input by Dr. [name] -Presented with markedly low thiamine level, requiring IV thiamine course, now  transitioned to PO thiamine to complete replenishment -Appreciate input by PT, recs were noted for SNF -Per SW, insurance has denied return to SNF. Family appeal currently in progress.  TOC team and Dr. [name] assisting.  TOC team has faxed over a large volume/"almost 160 pages" of paperwork.  Decision still pending.   Active Problems:   Hypokalemia Improved.  Follow periodically.    Metastatic malignant neoplasm  Has been followed by Oncology, appreciate in put by Dr. [name].  Discussed with Dr. [name] this morning.  He is not available 4/1.    Anemia, chronic disease Noted to be chronic  and stable.  Continue to follow CBC periodically.    Essential hypertension Continue home dose of Cozaar as tolerated.   -BP is currently stable and controlled  Dysphagia -Pt continues to have difficulty with tolerating PO intake -Cleared for regular diet with thin liquids per SLP -Continue to encourage PO as tolerated  Moderate protein calorie malnutrition -minimal PO intake recently -Pt reports not tolerating PO intake recently -Continue to encourage PO as tolerated   DVT prophylaxis: Lovenox subq Code Status: DNR Family Communication: Pt in room, family not at bedside  Status is: Inpatient  Remains inpatient appropriate because:IV treatments appropriate due to intensity of illness or inability to take PO  Dispo: The patient is from: SNF              Anticipated d/c is to: SNF              Patient currently is medically optimized for DC pending SNF decision which is under appeal by patient due to insurance denial even after P2P.   Difficult to place patient No   Consultants:   Oncology  Palliative Care  Procedures:     Antimicrobials: Anti-infectives (From admission, onward)   None      Subjective: Reports that PT evaluated her and work with her yesterday.  Has been doing some exercises in bed.  Feels motivated to work with PT to get stronger.  No complaints reported.  Objective: Vitals:   05/27/20 2119 05/28/20 0601 05/28/20 0956 05/28/20 1311  BP: 136/67 (!) 134/59  (!) 140/55  Pulse: 93 97  93  Resp: 16 16  19   Temp: 97.6  F (36.4 C) 97.7 F (36.5 C)  98 F (36.7 C)  TempSrc:    Oral  SpO2: 97% (!) 89%  93%  Weight:   88.2 kg   Height:        Intake/Output Summary (Last 24 hours) at 05/28/2020 1720 Last data filed at 05/28/2020 1100 Gross per 24 hour  Intake 130 ml  Output 700 ml  Net -570 ml   Filed Weights   05/20/20 1932 05/28/20 0956  Weight: 82.6 kg 88.2 kg    Examination: General exam: Middle-age female, moderately built,  poorly nourished, chronically ill looking, lying comfortably propped up in bed. Respiratory system: Clear to auscultation.  No increased work of breathing. Cardiovascular system: S1 and S2 heard, RRR.  No JVD, murmurs or pedal edema.  Telemetry personally reviewed: Sinus rhythm. Gastrointestinal system: Soft, nondistended, positive BS Central nervous system: CN2-12 grossly intact, strength intact Extremities: Perfused, no clubbing.  Grade 4 x 5 power in upper extremities.  Great 3 x 5 power in lower extremities. Skin: Normal skin turgor, no notable skin lesions seen Psychiatry: Mood normal // no visual hallucinations   Data Reviewed: I have personally reviewed following labs and imaging studies  CBC: Recent Labs  Lab 05/22/20 0401 05/25/20 0305 05/27/20 0339  WBC 5.6 4.3 4.4  HGB 8.3* 8.3* 8.4*  HCT 26.3* 26.9* 26.8*  MCV 98.1 100.7* 101.9*  PLT 187 188 254   Basic Metabolic Panel: Recent Labs  Lab 05/23/20 0433 05/24/20 0323 05/25/20 0305 05/26/20 0422 05/27/20 0339  NA 136 137 137 140 140  K 3.6 3.9 3.6 3.4* 3.5  CL 103 104 107 107 109  CO2 23 22 22  21* 19*  GLUCOSE 85 78 82 81 79  BUN 7* 6* 6* <5* <5*  CREATININE 0.48 0.49 0.36* 0.47 0.40*  CALCIUM 7.8* 7.9* 8.0* 8.2* 8.3*  MG 1.9  --  1.7  --  1.7   GFR: Estimated Creatinine Clearance: 78.6 mL/min (A) (by C-G formula based on SCr of 0.4 mg/dL (L)). Liver Function Tests: Recent Labs  Lab 05/23/20 0433 05/24/20 0323 05/25/20 0305 05/26/20 0422 05/27/20 0339  AST 48* 49* 50* 54* 59*  ALT 18 17 16 18 18   ALKPHOS 174* 182* 174* 176* 183*  BILITOT 1.9* 2.0* 2.0* 2.1* 2.6*  PROT 4.1* 4.1* 3.9* 4.1* 4.0*  ALBUMIN 2.2* 2.2* 2.1* 2.2* 2.2*    Recent Results (from the past 240 hour(s))  SARS CORONAVIRUS 2 (TAT 6-24 HRS) Nasopharyngeal Nasopharyngeal Swab     Status: None   Collection Time: 05/20/20 10:01 PM   Specimen: Nasopharyngeal Swab  Result Value Ref Range Status   SARS Coronavirus 2 NEGATIVE NEGATIVE  Final    Comment: (NOTE) SARS-CoV-2 target nucleic acids are NOT DETECTED.  The SARS-CoV-2 RNA is generally detectable in upper and lower respiratory specimens during the acute phase of infection. Negative results do not preclude SARS-CoV-2 infection, do not rule out co-infections with other pathogens, and should not be used as the sole basis for treatment or other patient management decisions. Negative results must be combined with clinical observations, patient history, and epidemiological information. The expected result is Negative.  Fact Sheet for Patients: SugarRoll.be  Fact Sheet for Healthcare Providers: https://www.woods-mathews.com/  This test is not yet approved or cleared by the [place] FDA and  has been authorized for detection and/or diagnosis of SARS-CoV-2 by FDA under an Emergency Use Authorization (EUA). This EUA will remain  in effect (meaning this test can be used) for the  duration of the COVID-19 declaration under Se ction 564(b)(1) of the Act, 21 U.S.C. section 360bbb-3(b)(1), unless the authorization is terminated or revoked sooner.  Performed at [facility], [place] [address], [place], [place] [address]   SARS CORONAVIRUS 2 (TAT 6-24 HRS) Nasopharyngeal Nasopharyngeal Swab     Status: None   Collection Time: 05/23/20  1:24 PM   Specimen: Nasopharyngeal Swab  Result Value Ref Range Status   SARS Coronavirus 2 NEGATIVE NEGATIVE Final    Comment: (NOTE) SARS-CoV-2 target nucleic acids are NOT DETECTED.  The SARS-CoV-2 RNA is generally detectable in upper and lower respiratory specimens during the acute phase of infection. Negative results do not preclude SARS-CoV-2 infection, do not rule out co-infections with other pathogens, and should not be used as the sole basis for treatment or other patient management decisions. Negative results must be combined with clinical observations, patient history, and  epidemiological information. The expected result is Negative.  Fact Sheet for Patients: SugarRoll.be  Fact Sheet for Healthcare Providers: https://www.woods-mathews.com/  This test is not yet approved or cleared by the [place] FDA and  has been authorized for detection and/or diagnosis of SARS-CoV-2 by FDA under an Emergency Use Authorization (EUA). This EUA will remain  in effect (meaning this test can be used) for the duration of the COVID-19 declaration under Se ction 564(b)(1) of the Act, 21 U.S.C. section 360bbb-3(b)(1), unless the authorization is terminated or revoked sooner.  Performed at [facility], [place] [address] [place], [place] [address]      Radiology Studies: No results found.  Scheduled Meds: . (feeding supplement) PROSource Plus  30 mL Oral Daily  . anastrozole  1 mg Oral Daily  . Chlorhexidine Gluconate Cloth  6 each Topical Daily  . dronabinol  2.5 mg Oral QAC lunch  . enoxaparin (LOVENOX) injection  40 mg Subcutaneous Q24H  . feeding supplement  1 Container Oral Q24H  . feeding supplement  237 mL Oral Q24H  . losartan  12.5 mg Oral Daily  . multivitamin  15 mL Oral Daily  . palbociclib  75 mg Oral Daily  . vitamin B-6  100 mg Oral Daily  . sodium chloride flush  10-40 mL Intracatheter Q12H  . thiamine  100 mg Oral Daily   Continuous Infusions:    LOS: 8 days   Vernell Leep, MD, [place], [facility]. [facility]  To contact the attending provider between 7A-7P or the covering provider during after hours 7P-7A, please log into the web site [email or web address] and access using universal [place] password for that web site. If you do not have the password, please call the hospital operator.

## 2020-05-28 NOTE — TOC Progression Note (Signed)
Transition of Care [facility]) - Progression Note    Patient Details  Name: Brooke [name] MRN: [id number] Date of Birth: [birth year]  Transition of Care [facility]) CM/SW Contact  [name], [place] Phone Number: 05/28/2020, 6:56 PM  Clinical Narrative:     Patient's appeal decision still pending, CSW faxed updated clinical notes from today to insurance appeal company.     Expected Discharge Plan: [place] Barriers to Discharge: Continued Medical Work up  Expected Discharge Plan and Services Expected Discharge Plan: [place]   Discharge Planning Services: CM Consult   Living arrangements for the past 2 months: [place]                                       Social Determinants of Health (SDOH) Interventions    Readmission Risk Interventions Readmission Risk Prevention Plan 05/04/2020  Transportation Screening Complete  PCP or Specialist Appt within 3-5 Days Complete  HRI or [place] Complete  Social Work Consult for [place] Planning/Counseling Complete  Palliative Care Screening Complete  Medication Review [employer]) Complete  Some recent data might be hidden

## 2020-05-28 NOTE — Progress Notes (Signed)
Brooke [name]   DOB:1950/05/03   EH#:[id number]   IBB#:[id number]  Subjective:  Brooke [name] had a very difficult time yesterday--the whole insurance issue has devastated her psychologically, she imagines herself with nowhere to go or piling up debts she can't pay etc. She imagines herself out on the street. I reassured her she has done what she needed to do in starting the appeal, and she needs to concentrate on getting better--working on the swallowing and physical therapy problems. This AM she seems calmer and much more motivated. She is agreeing to get OOBTC (discussed with nurse)  Objective: white woman examined in bed Vitals:   05/27/20 2119 05/28/20 0601  BP: 136/67 (!) 134/59  Pulse: 93 97  Resp: 16 16  Temp: 97.6 F (36.4 C) 97.7 F (36.5 C)  SpO2: 97% (!) 89%    Body mass index is 26.89 kg/m.  Intake/Output Summary (Last 24 hours) at 05/28/2020 0804 Last data filed at 05/28/2020 0600 Gross per 24 hour  Intake 10 ml  Output 700 ml  Net -690 ml    Lungs no rales or rhonchi--auscultated anterolaterally Heart regular rate and rhythm Abd soft, nontender, positive bowel sounds Neuro: nonfocal, well oriented, anxious affect Breasts: Deferred  CBG (last 3)  No results for input(s): GLUCAP in the last 72 hours.   Labs:  Lab Results  Component Value Date   WBC 4.4 05/27/2020   HGB 8.4 (L) 05/27/2020   HCT 26.8 (L) 05/27/2020   MCV 101.9 (H) 05/27/2020   PLT 186 05/27/2020   NEUTROABS 5.4 05/20/2020    _0 @  Urine Studies No results for input(s): UHGB, CRYS in the last 72 hours.  Invalid input(s): UACOL, UAPR, USPG, UPH, UTP, UGL, UKET, UBIL, UNIT, UROB, [place], UEPI, UWBC, Duwayne Heck [place], [place]  Basic Metabolic Panel: Recent Labs  Lab 05/23/20 0433 05/24/20 0323 05/25/20 0305 05/26/20 0422 05/27/20 0339  NA 136 137 137 140 140  K 3.6 3.9 3.6 3.4* 3.5  CL 103 104 107 107 109  CO2 _1 21* 19*  GLUCOSE 85 78 82 81 79  BUN 7* 6* 6* <5* <5*   CREATININE 0.48 0.49 0.36* 0.47 0.40*  CALCIUM 7.8* 7.9* 8.0* 8.2* 8.3*  MG 1.9  --  1.7  --  1.7   GFR Estimated Creatinine Clearance: 76.3 mL/min (A) (by C-G formula based on SCr of 0.4 mg/dL (L)). Liver Function Tests: Recent Labs  Lab 05/23/20 0433 05/24/20 0323 05/25/20 0305 05/26/20 0422 05/27/20 0339  AST 48* 49* 50* 54* 59*  ALT _2 ALKPHOS 174* 182* 174* 176* 183*  BILITOT 1.9* 2.0* 2.0* 2.1* 2.6*  PROT 4.1* 4.1* 3.9* 4.1* 4.0*  ALBUMIN 2.2* 2.2* 2.1* 2.2* 2.2*   No results for input(s): LIPASE, AMYLASE in the last 168 hours. No results for input(s): AMMONIA in the last 168 hours. Coagulation profile No results for input(s): INR, PROTIME in the last 168 hours.  CBC: Recent Labs  Lab 05/22/20 0401 05/25/20 0305 05/27/20 0339  WBC 5.6 4.3 4.4  HGB 8.3* 8.3* 8.4*  HCT 26.3* 26.9* 26.8*  MCV 98.1 100.7* 101.9*  PLT 187 188 186   Cardiac Enzymes: No results for input(s): CKTOTAL, CKMB, CKMBINDEX, TROPONINI in the last 168 hours. BNP: Invalid input(s): POCBNP CBG: No results for input(s): GLUCAP in the last 168 hours. D-Dimer No results for input(s): DDIMER in the last 72 hours. Hgb A1c No results for input(s): HGBA1C in the last 72 hours. Lipid Profile  No results for input(s): CHOL, HDL, LDLCALC, TRIG, CHOLHDL, LDLDIRECT in the last 72 hours. Thyroid function studies No results for input(s): TSH, T4TOTAL, T3FREE, THYROIDAB in the last 72 hours.  Invalid input(s): FREET3 Anemia work up No results for input(s): VITAMINB12, FOLATE, FERRITIN, TIBC, IRON, RETICCTPCT in the last 72 hours. Microbiology Recent Results (from the past 240 hour(s))  SARS CORONAVIRUS 2 (TAT 6-24 HRS) Nasopharyngeal Nasopharyngeal Swab     Status: None   Collection Time: 05/20/20 10:01 PM   Specimen: Nasopharyngeal Swab  Result Value Ref Range Status   SARS Coronavirus 2 NEGATIVE NEGATIVE Final    Comment: (NOTE) SARS-CoV-2 target nucleic acids are NOT  DETECTED.  The SARS-CoV-2 RNA is generally detectable in upper and lower respiratory specimens during the acute phase of infection. Negative results do not preclude SARS-CoV-2 infection, do not rule out co-infections with other pathogens, and should not be used as the sole basis for treatment or other patient management decisions. Negative results must be combined with clinical observations, patient history, and epidemiological information. The expected result is Negative.  Fact Sheet for Patients: https://www.fda.gov/media/138098/download  Fact Sheet for Healthcare Providers: https://www.fda.gov/media/138095/download  This test is not yet approved or cleared by the [place] FDA and  has been authorized for detection and/or diagnosis of SARS-CoV-2 by FDA under an Emergency Use Authorization (EUA). This EUA will remain  in effect (meaning this test can be used) for the duration of the COVID-19 declaration under Se ction 564(b)(1) of the Act, 21 U.S.C. section 360bbb-3(b)(1), unless the authorization is terminated or revoked sooner.  Performed at [facility], [address] [place], [place] [address]   SARS CORONAVIRUS 2 (TAT 6-24 HRS) Nasopharyngeal Nasopharyngeal Swab     Status: None   Collection Time: 05/23/20  1:24 PM   Specimen: Nasopharyngeal Swab  Result Value Ref Range Status   SARS Coronavirus 2 NEGATIVE NEGATIVE Final    Comment: (NOTE) SARS-CoV-2 target nucleic acids are NOT DETECTED.  The SARS-CoV-2 RNA is generally detectable in upper and lower respiratory specimens during the acute phase of infection. Negative results do not preclude SARS-CoV-2 infection, do not rule out co-infections with other pathogens, and should not be used as the sole basis for treatment or other patient management decisions. Negative results must be combined with clinical observations, patient history, and epidemiological information. The expected result is Negative.  Fact  Sheet for Patients: https://www.fda.gov/media/138098/download  Fact Sheet for Healthcare Providers: https://www.fda.gov/media/138095/download  This test is not yet approved or cleared by the [place] FDA and  has been authorized for detection and/or diagnosis of SARS-CoV-2 by FDA under an Emergency Use Authorization (EUA). This EUA will remain  in effect (meaning this test can be used) for the duration of the COVID-19 declaration under Se ction 564(b)(1) of the Act, 21 U.S.C. section 360bbb-3(b)(1), unless the authorization is terminated or revoked sooner.  Performed at [facility], [address] [place], [place] [address]       Studies:  No results found.  Assessment: 69 y.o.  woman admitted for thiamine replacement, with a history of stage IV breast cancer as follows:  (1) status post bilateral breast biopsies 01/26/2017, showing: in the right breast, a complex sclerosing lesion, status post lumpectomy 09/25/2017, with no malignancy identified.  (2) in the left breast, a cT2 pN1 invasive ductal carcinoma (with some lobular features but E-cadherin positive), grade 2, estrogen and progesterone receptor positive, HER-2 not amplified, with an MIB-1 of 15-20%             (  a) breast MRI 02/04/2017 suggests a T3 N1 tumor  (3) started neoadjuvant cyclophosphamide/docetaxel 03/03/2017, discontinued after 1 cycle with very poor tolerance             (a) started cyclophosphamide/methotrexate/fluorouracil (CMF) 03/28/2017, repeated x7 cycles, last dose 08/01/2017  (4) status post left modified radical mastectomy 09/25/17 for a ypT5 ypN2, stage IIIA invasive ductal carcinoma, grade 2, again estrogen and progesterone receptor positive, HER-2 not amplified  (5) adjuvant radiation completed 12/21/2017             (a) capecitabine sensitization tolerated only the initial week of radiation (b) Site/dose:The patient initially received a dose of 50.4 Gy in 28 fractions  to the leftchest wall andleftsupraclavicular region. This was delivered using a 3-D conformal, 4 field technique. The patient then received a boost to the mastectomy scar. This delivered an additional 10 Gy in 5 fractions using an en face electron field. The total dose was 60.4 Gy.  ( 6) tamoxifen started 03/09/2018, discontinued January 2022 with metastatic progression  (7) genetics testing 12/12/2017 through the Multi-Gene Panel offered by [facility] found no deleterious mutations in AIP, ALK, APC, ATM, AXIN2,BAP1,  BARD1, BLM, BMPR1A, BRCA1, BRCA2, BRIP1, CASR, CDC73, CDH1, CDK4, CDKN1B, CDKN1C, CDKN2A (p14ARF), CDKN2A (p16INK4a), CEBPA, CHEK2, CTNNA1, DICER1, DIS3L2, EGFR (c.2369C>T, p.Thr790Met variant only), EPCAM (Deletion/duplication testing only), FH, FLCN, GATA2, GPC3, GREM1 (Promoter region deletion/duplication testing only), HOXB13 (c.251G>A, p.Gly84Glu), HRAS, KIT, MAX, MEN1, MET, MITF (c.952G>A, p.Glu318Lys variant only), MLH1, MSH2, MSH3, MSH6, MUTYH, NBN, NF1, NF2, NTHL1, PALB2, PDGFRA, PHOX2B, PMS2, POLD1, POLE, POT1, PRKAR1A, PTCH1, PTEN, RAD50, RAD51C, RAD51D, RB1, RECQL4, RET, RUNX1, SDHAF2, SDHA (sequence changes only), SDHB, SDHC, SDHD, SMAD4, SMARCA4, SMARCB1, SMARCE1, STK11, SUFU, TERC, TERT, TMEM127, TP53, TSC1, TSC2, VHL, WRN and WT1.   (8) Foundation 1 testing shows stable microsatellite status and 1 mutation/ Mb, TP53 mutations x2             (a) PD-L1 dated July 2019 negative  METASTATIC DISEASE: January 2022 (9) CT angio of the chest 02/27/2020 shows multiple hilar nodes consistent with prior granulomatous infection, multiple small pulmonary nodules, multiple punctate granulomas throughout the liver and spleen, nodular hepatic contour and areas of mottling possibly due to steatosis, 1.6 cm left adrenal nodule             (a) PET scan 03/05/2020 shows mildly to highly hypermetabolic adenopathy, the pulmonary nodules being mildly to not hypermetabolic, a large right hepatic  liver lesion with an SUV of 13.2 and additional hypermetabolic right and left lobe foci, the adrenal lesion being hypermetabolic with an SUV of 16.  There are also peritoneal nodules and innumerable foci of bony involvement.  Incidental note was made of small heterogeneous foci of increased uptake in the cerebellum.  The CT images did not show gross abnormality in the visualized brain             (b) on 03/09/2020 CA 27-29 was 983.0, CEA 744.17, the CA 19-9 normal at 32             (c) liver biopsy 03/13/2020 confirms metastati carcinoma, estrogen receptor positive, progesterone and HER 2 negative, with an Mib-1 of 40%             (d) brain MRI 03/26/2020 shows dural involvement including a 1.0 cm dural-based cerebellar lesion  (10) paclitaxel started 03/30/2020, repeated x3, last dose 04/30/2020 w/o evidence of response  (11) zoledronate started 03/19/2020, to be repeated every 12 weeks (next dose 06/11/2020)  (  12)anastrozolestarted 05/07/2020 (a) palbociclib 125 mg daily, 21 days off, 7 days off, started 05/15/2018  (13) severe thiamine depletion requiring emergent IV repletion 05/20/2020  (a) continue oral thiamine replacement nindefinitely  Plan:  Compared to a month ago, Peggie appears clinically improved. Part of that may be simply that we stopped chemo, but her swallowing for example is better and she seems able to do more (though still very little) with Rehab. That part should improve now her thiamine depletion has been corrected.  She continues on anastrozole daily. She missed a few days of palbociclib. This was resumed yesterday and I would continue through Sunday April 3, then stop-- or, if she is discharged to [facility] in the next few days, I would not discharge her on that medication-- we will add it again after a break.  She has a follow-up appointment with me 04/14 and is aware. If she is discharged before I return Monday we will follow-up with her  facility.  Greatly appreciate your help to this patient!     C , MD 05/28/2020  8:04 AM Medical Oncology and Hematology [facility] [address] [place], [place] [address] Tel. [phone]    Fax. [phone] 

## 2020-05-28 NOTE — Evaluation (Signed)
Occupational Therapy Evaluation Patient Details Name: Brooke [name] MRN: [id number] DOB: 04-May-1950 Today's Date: 05/28/2020    History of Present Illness Pt is 70 y.o. female admitted on 05/20/20 with thiamine deficiency and hypokalemia.  Pt has been receiving chemotherapy. She had recent hospital admission and went to SNF for therapy.  Pt with medical history significant for HTN, metastatic breast cancer.   Clinical Impression   Brooke [name] is a 70 year old woman who presents with generalized weakness, edema in each extremity, poor activity tolerance, impaired balance and complaints of pain. On evaluation patient exhibits a flat affect but agreeable to therapy. Patient max assist to transfer to side of bed with verbal cues to initiate movement, min assist to min guard for edge of bed balance, and max x 2 to squat pivot to recliner. Patient reports back pain during sitting and after transfer, is unable to keep her head up due to fatigue and weakness. Patient needing set up for grooming task, mod assist for UB bathing, max assist for LB bathing and total assist for LB dressing and toileting. Patient will benefit from skilled OT services while in hospital to improve deficits and learn compensatory strategies as needed in order to improve functional abilities. Recommend aggressive short term rehab at discharge prior to return home.      Follow Up Recommendations  SNF    Equipment Recommendations  3 in 1 bedside commode;Tub/shower bench    Recommendations for Other Services       Precautions / Restrictions Precautions Precautions: Fall Precaution Comments: anxiety; pre-medicate if possible, lymphedema in LUE Restrictions Weight Bearing Restrictions: No      Mobility Bed Mobility Overal bed mobility: Needs Assistance Bed Mobility: Supine to Sit     Supine to sit: Max assist;+2 for safety/equipment;HOB elevated     General bed mobility comments: Active assist for LEs, hand  hold to pull up trunk as well as more assistance to pull trunk forward, and use of bed pad to pivot hips. Patient needed verbal cues to initiate each movement and assist more with transfer. Reports back pain with sitting edge of bed.    Transfers Overall transfer level: Needs assistance Equipment used: None Transfers: Squat Pivot Transfers     Squat pivot transfers: Max assist;+2 physical assistance;+2 safety/equipment;From elevated surface     General transfer comment: performed squat pivot from elevated bed height with +2 physical assistance - needing two scoots to complete transfer to recliner. Patient able to minimally assist with weight bearing through LEs with transfer.    Balance Overall balance assessment: Needs assistance Sitting-balance support: Bilateral upper extremity supported;Feet supported Sitting balance-Leahy Scale: Fair Sitting balance - Comments: min - min guard assist at edge of bed.       Standing balance comment: unable to officially stand                           ADL either performed or assessed with clinical judgement   ADL Overall ADL's : Needs assistance/impaired Eating/Feeding: Independent Eating/Feeding Details (indicate cue type and reason): independent to feed self ice chips Grooming: Set up;Bed level;Sitting   Upper Body Bathing: Set up;Sitting;Bed level;Moderate assistance   Lower Body Bathing: Sit to/from stand;Maximal assistance;Sitting/lateral leans   Upper Body Dressing : Maximal assistance;Sitting   Lower Body Dressing: Total assistance;Bed level;+2 for physical assistance     Toilet Transfer Details (indicate cue type and reason): unable Toileting- Clothing Manipulation and Hygiene: Total assistance;Bed  level               Vision Patient Visual Report: No change from baseline       Perception     Praxis      Pertinent Vitals/Pain Pain Assessment: Faces Faces Pain Scale: Hurts little more Pain Location:  back Pain Descriptors / Indicators: Grimacing;Aching Pain Intervention(s): Limited activity within patient's tolerance;Monitored during session;Repositioned     Hand Dominance Left   Extremity/Trunk Assessment Upper Extremity Assessment Upper Extremity Assessment: RUE deficits/detail;LUE deficits/detail RUE Deficits / Details: WFL ROM, 3+/5 shoulder, 4-/5 elbow, 4/5 wrist, grip 4/5, arm edematous RUE Sensation:  (reports tingling in arm) RUE Coordination: WNL LUE Deficits / Details: WFL ROM, 3+/5 shoulder, 4-/5 elbow, 4/5 wrist, grip 4/5, patient reports lymphedema in arm LUE Sensation:  (reports tingling throughout arm) LUE Coordination: WNL   Lower Extremity Assessment Lower Extremity Assessment: Defer to PT evaluation   Cervical / Trunk Assessment Cervical / Trunk Assessment: Normal   Communication Communication Communication: No difficulties   Cognition Arousal/Alertness: Awake/alert Behavior During Therapy: Flat affect Overall Cognitive Status: Within Functional Limits for tasks assessed                                     General Comments       Exercises     Shoulder Instructions      Home Living Family/patient expects to be discharged to:: Skilled nursing facility Living Arrangements: Alone Available Help at Discharge:  (pt stated she doesn't have assistance available, some family are visiting on 05/10/20 from "other parts of the country" but can only stay a few days.) Type of Home: Apartment Home Access: Stairs to enter [employer] of Steps: 3 Entrance Stairs-Rails: Left Home Layout: One level     Bathroom Shower/Tub: Teacher, early years/pre: Standard     Home Equipment: Environmental consultant - 2 wheels   Additional Comments: above per admission 2 weeks ago, pt discharged to SNF for rehab      Prior Functioning/Environment Level of Independence: Needs assistance  Gait / Transfers Assistance Needed: has been nonambulatory since  d/c to SNF from previous admission due to weakness     Comments: prior to last admission, pt was walking with RW        OT Problem List: Decreased strength;Increased edema;Decreased activity tolerance;Impaired balance (sitting and/or standing);Decreased knowledge of use of DME or AE;Pain;Impaired UE functional use;Obesity      OT Treatment/Interventions: Self-care/ADL training;Therapeutic exercise;Therapeutic activities;Cognitive remediation/compensation;Energy conservation;DME and/or AE instruction;Patient/family education;Balance training    OT Goals(Current goals can be found in the care plan section) Acute Rehab OT Goals Patient Stated Goal: to walk to bathroom OT Goal Formulation: With patient Time For Goal Achievement: 06/11/20 Potential to Achieve Goals: Fair  OT Frequency: Min 2X/week   Barriers to D/C: Decreased caregiver support          Co-evaluation              AM-PAC OT "6 Clicks" Daily Activity     Outcome Measure Help from another person eating meals?: None Help from another person taking care of personal grooming?: A Little Help from another person toileting, which includes using toliet, bedpan, or urinal?: Total Help from another person bathing (including washing, rinsing, drying)?: A Lot Help from another person to put on and taking off regular upper body clothing?: A Lot Help from another person to put on  and taking off regular lower body clothing?: Total 6 Click Score: 13   End of Session Nurse Communication: Mobility status;Need for lift equipment  Activity Tolerance: Patient limited by fatigue Patient left: in chair;with call bell/phone within reach;with chair alarm set;with family/visitor present  OT Visit Diagnosis: Unsteadiness on feet (R26.81);Muscle weakness (generalized) (M62.81);Pain;Adult, failure to thrive (R62.7)                Time: 3374-4514 OT Time Calculation (min): 25 min Charges:  OT General Charges $OT Visit: 1 Visit OT  Evaluation $OT Eval Moderate Complexity: 1 Mod  Fields Oros, OTR/L [place]  Office [phone] Pager: [place] 05/28/2020, 1:15 PM

## 2020-05-28 NOTE — Care Management Important Message (Signed)
Important Message  Patient Details IM Letter given to the Patient. Name: Brooke [name] MRN: [id number] Date of Birth: 11/29/1950   Medicare Important Message Given:  Yes     Kerin Salen 05/28/2020, 11:34 AM

## 2020-05-29 DIAGNOSIS — E519 Thiamine deficiency, unspecified: Secondary | ICD-10-CM | POA: Diagnosis not present

## 2020-05-29 DIAGNOSIS — I10 Essential (primary) hypertension: Secondary | ICD-10-CM | POA: Diagnosis not present

## 2020-05-29 DIAGNOSIS — C50919 Malignant neoplasm of unspecified site of unspecified female breast: Secondary | ICD-10-CM

## 2020-05-29 DIAGNOSIS — E876 Hypokalemia: Secondary | ICD-10-CM | POA: Diagnosis not present

## 2020-05-29 DIAGNOSIS — D638 Anemia in other chronic diseases classified elsewhere: Secondary | ICD-10-CM | POA: Diagnosis not present

## 2020-05-29 LAB — CBC
HCT: 25.2 % — ABNORMAL LOW (ref 36.0–46.0)
Hemoglobin: 7.9 g/dL — ABNORMAL LOW (ref 12.0–15.0)
MCH: 32.2 pg (ref 26.0–34.0)
MCHC: 31.3 g/dL (ref 30.0–36.0)
MCV: 102.9 fL — ABNORMAL HIGH (ref 80.0–100.0)
Platelets: 146 10*3/uL — ABNORMAL LOW (ref 150–400)
RBC: 2.45 MIL/uL — ABNORMAL LOW (ref 3.87–5.11)
RDW: 27.7 % — ABNORMAL HIGH (ref 11.5–15.5)
WBC: 3.8 10*3/uL — ABNORMAL LOW (ref 4.0–10.5)
nRBC: 0.8 % — ABNORMAL HIGH (ref 0.0–0.2)

## 2020-05-29 LAB — BASIC METABOLIC PANEL
Anion gap: 10 (ref 5–15)
BUN: 5 mg/dL — ABNORMAL LOW (ref 8–23)
CO2: 22 mmol/L (ref 22–32)
Calcium: 8.6 mg/dL — ABNORMAL LOW (ref 8.9–10.3)
Chloride: 108 mmol/L (ref 98–111)
Creatinine, Ser: 0.38 mg/dL — ABNORMAL LOW (ref 0.44–1.00)
GFR, Estimated: >60 mL/min (ref >60)
Glucose, Bld: 84 mg/dL (ref 70–99)
Potassium: 3.1 mmol/L — ABNORMAL LOW (ref 3.5–5.1)
Sodium: 140 mmol/L (ref 135–145)

## 2020-05-29 MED ORDER — POTASSIUM CHLORIDE 10 MEQ/100ML IV SOLN
10.0000 meq | INTRAVENOUS | Status: AC
  Administered 2020-05-29 – 2020-05-30 (×5): 10 meq via INTRAVENOUS

## 2020-05-29 NOTE — Progress Notes (Signed)
Physical Therapy Treatment Patient Details Name: Brooke [name] MRN: [id number] DOB: May 27, 1950 Today's Date: 05/29/2020    History of Present Illness Pt is 70 y.o. female admitted on 05/20/20 with thiamine deficiency and hypokalemia.  Pt has been receiving chemotherapy. She had recent hospital admission and went to SNF for therapy.  Pt with medical history significant for HTN, metastatic breast cancer.    PT Comments    Pt continues to require extensive assist of 2 for basic mobility. Requiring max encouragement to work with PT, complains of pain however refused pain  meds. Refusing to eat when encouraged. Continue to recommend SNF post acute.  Follow Up Recommendations  SNF     Equipment Recommendations  Wheelchair cushion (measurements PT);Wheelchair (measurements PT);3in1 (PT);Hospital bed    Recommendations for Other Services       Precautions / Restrictions Precautions Precautions: Fall Precaution Comments: anxiety; pre-medicate if possible, lymphedema in LUE Restrictions Weight Bearing Restrictions: No    Mobility  Bed Mobility Overal bed mobility: Needs Assistance Bed Mobility: Supine to Sit     Supine to sit: Max assist;+2 for safety/equipment;HOB elevated     General bed mobility comments: step by step verbal cues and encouragment needed for pt to self assist. incr time. assist to complete bil LEs off bed and heavy assist to elevate trunk    Transfers Overall transfer level: Needs assistance Equipment used: Ambulation equipment used   Sit to Stand: Max assist;Total assist;+2 physical assistance;+2 safety/equipment         General transfer comment: stood x2 with +2 max/total assist and bed pad utilized to extend hips, bring COG over BOS from elevated surface/from stedy. requires multi-modal cues for sequencing and participation  Ambulation/Gait             General Gait Details: currently non amb due to profound weakness   Stairs              Wheelchair Mobility    Modified Rankin (Stroke Patients Only)       Balance   Sitting-balance support: Bilateral upper extremity supported;Feet supported Sitting balance-Leahy Scale: Fair Sitting balance - Comments: fair to poor, pt with sudden posterior LOB x1 requiring assist to recover. able to briefly sit without UE support     Standing balance-Leahy Scale: Zero Standing balance comment: unable to stand                            Cognition Arousal/Alertness: Awake/alert Behavior During Therapy: Anxious;Flat affect Overall Cognitive Status: Impaired/Different from baseline Area of Impairment: Attention;Following commands;Safety/judgement;Problem solving                   Current Attention Level: Sustained   Following Commands: Follows one step commands with increased time     Problem Solving: Difficulty sequencing;Requires verbal cues;Requires tactile cues General Comments: pt is extremenly anxious, demonstrates decr insight into course of illness. states she wants PT everyday however requires much encouragment to participate at all, intermittent confusion. pt states staff have been "lying" to her; requires frequent redirection to tasks. demands to go back to bed after being transferred to chair      Exercises General Exercises - Lower Extremity Ankle Circles/Pumps: AROM;Both;10 reps;Supine Long Arc Quad: AAROM;Both;5 reps Other Exercises Other Exercises: scapular retraction x5 sitting EOB    General Comments        Pertinent Vitals/Pain Pain Assessment: Faces Faces Pain Scale: Hurts even more Pain Location: back Pain  Descriptors / Indicators: Moaning;Grimacing;Sore Pain Intervention(s): Limited activity within patient's tolerance;Monitored during session;Other (comment) (RN attempted to premed, pt refused)    Home Living                      Prior Function            PT Goals (current goals can now be found in the care  plan section) Acute Rehab PT Goals Patient Stated Goal: denies any goals, wants PT to leave her alone PT Goal Formulation: With patient Time For Goal Achievement: 06/12/20 Potential to Achieve Goals: Fair Progress towards PT goals: Progressing toward goals    Frequency    Min 2X/week      PT Plan Current plan remains appropriate    Co-evaluation              AM-PAC PT "6 Clicks" Mobility   Outcome Measure  Help needed turning from your back to your side while in a flat bed without using bedrails?: A Lot Help needed moving from lying on your back to sitting on the side of a flat bed without using bedrails?: A Lot Help needed moving to and from a bed to a chair (including a wheelchair)?: Total Help needed standing up from a chair using your arms (e.g., wheelchair or bedside chair)?: Total Help needed to walk in hospital room?: Total Help needed climbing 3-5 steps with a railing? : Total 6 Click Score: 8    End of Session Equipment Utilized During Treatment: Gait belt Activity Tolerance: Patient tolerated treatment well Patient left: with call bell/phone within reach;in chair;with chair alarm set Nurse Communication: Need for lift equipment (on maxisky pad for back to bed) PT Visit Diagnosis: Difficulty in walking, not elsewhere classified (R26.2);Muscle weakness (generalized) (M62.81)     Time: 1859-0931 PT Time Calculation (min) (ACUTE ONLY): 34 min  Charges:  $Therapeutic Activity: 23-37 mins                     Baxter Flattery, PT  Acute Rehab Dept ([place]) [phone] Pager [phone]  05/29/2020    [facility] 05/29/2020, 3:34 PM

## 2020-05-29 NOTE — Progress Notes (Signed)
PROGRESS NOTE    Brooke [name]  ZJI:[id number] DOB: January 01, 1951 DOA: 05/20/2020 PCP: [name], MD     Brief Narrative:  70 y.o.WF PMHx HTN,metastatic breast cancer. She has been receiving chemotherapy and is followed by Dr. [name].   Patient has had progressive weakness was residing at a nursing facility. Patient had outpatient laboratory tests on March 13and results reportedand she was noted to haveundectablevitamin B1 levels. Patient's B12 level is also decreased. Patient was called with the result and instructed to come to the ED because she needs IV replacement therapy. Dr. [name] called the ED and stated the patient needs 500 mg of IV thiamine 3 times daily for the next 3 days.She was also reportedly seen by neurology recently for intermittent confusion and neurology recommended the IV thiamine replacement protocol   Subjective: A/O x4, negative S OB.  Positive abdominal pain.  Patient anxious about SNF denying her ability to return, which has been appealed.   Assessment & Plan: Covid vaccination;   Principal Problem:   Thiamine deficiency Active Problems:   Metastatic malignant neoplasm (HCC)   Hypokalemia   Anemia, chronic disease   Essential hypertension   Thiamine deficiency with weakness -Brooke [name] is admitted per request from Hematology service, ( Dr. [name]) -Presented with markedly low thiamine level, requiring IV thiamine course, now  transitioned to PO thiamine to complete replenishment -Per SW, insurance has denied return to SNF. Family appeal currently in progress.  TOC team and Dr. [name] assisting.  TOC team has faxed over a large volume/"almost 160 pages" of paperwork.   -4/1 decision still pending.  Hypokalemia -Potassium goal> 4 -Potassium IV 50 mEq  Metastatic malignant neoplasm  -Per EMR previous physician discussed case with Oncology, Dr. [name].     Anemia, chronic disease -Noted to be chronic and  stable.  Lab Results  Component Value Date   HGB 7.9 (L) 05/29/2020   HGB 8.4 (L) 05/27/2020   HGB 8.3 (L) 05/25/2020   HGB 8.3 (L) 05/22/2020   HGB 8.8 (L) 05/21/2020  -Slowly trending down -4/1 no active sites of bleeding -All blood lines are trending down so may be delusional -Occult blood pending -Transfuse for hemoglobin<7  Essential HTN -Cozaar 12.5 mg daily  Dysphagia -Pt continues to have difficulty with tolerating PO intake -Cleared for regular diet with thin liquids per SLP -Continue to encourage PO as tolerated  Moderate protein calorie malnutrition -minimal PO intake recently -Pt reports not tolerating PO intake recently -Continue to encourage PO as tolerated    DVT prophylaxis: Lovenox Code Status: Full Family Communication:  Status is: Inpatient    Dispo: The patient is from: SNF              Anticipated d/c is to: SNF              Anticipated d/c date is: SNF placement pending; insurance denial appealed awaiting findings              Patient currently stable      Consultants:    Procedures/Significant Events:    I have personally reviewed and interpreted all radiology studies and my findings are as above.  VENTILATOR SETTINGS:    Cultures   Antimicrobials:    Devices    LINES / TUBES:      Continuous Infusions:   Objective: Vitals:   05/28/20 0956 05/28/20 1311 05/28/20 2049 05/29/20 0500  BP:  (!) 140/55 (!) 128/58 135/72  Pulse:  93 95 84  Resp:  19 18 20   Temp:  98 F (36.7 C) 98.2 F (36.8 C) 98 F (36.7 C)  TempSrc:  Oral Oral Oral  SpO2:  93% 95% 98%  Weight: 88.2 kg     Height:        Intake/Output Summary (Last 24 hours) at 05/29/2020 7680 Last data filed at 05/29/2020 0400 Gross per 24 hour  Intake 120 ml  Output 700 ml  Net -580 ml   Filed Weights   05/20/20 1932 05/28/20 0956  Weight: 82.6 kg 88.2 kg    Examination:  General: A/O x4 No acute respiratory distress Eyes: negative scleral  hemorrhage, negative anisocoria, negative icterus ENT: Negative Runny nose, negative gingival bleeding, Neck:  Negative scars, masses, torticollis, lymphadenopathy, JVD Lungs: Clear to auscultation bilaterally without wheezes or crackles Cardiovascular: Regular rate and rhythm without murmur gallop or rub normal S1 and S2 left total mastectomy Abdomen: negative abdominal pain, nondistended, positive soft, bowel sounds, no rebound, no ascites, no appreciable mass Extremities: No significant cyanosis, clubbing, or edema bilateral lower extremities Skin: Negative rashes, lesions, ulcers Psychiatric:  Negative depression, negative anxiety, negative fatigue, negative mania  Central nervous system:  Cranial nerves II through XII intact, tongue/uvula midline, all extremities muscle strength 5/5, sensation intact throughout, negative dysarthria, negative expressive aphasia, negative receptive aphasia.  .     Data Reviewed: Care during the described time interval was provided by me .  I have reviewed this patient's available data, including medical history, events of note, physical examination, and all test results as part of my evaluation.  CBC: Recent Labs  Lab 05/25/20 0305 05/27/20 0339 05/29/20 0303  WBC 4.3 4.4 3.8*  HGB 8.3* 8.4* 7.9*  HCT 26.9* 26.8* 25.2*  MCV 100.7* 101.9* 102.9*  PLT 188 186 881*   Basic Metabolic Panel: Recent Labs  Lab 05/23/20 0433 05/24/20 0323 05/25/20 0305 05/26/20 0422 05/27/20 0339 05/29/20 0303  NA 136 137 137 140 140 140  K 3.6 3.9 3.6 3.4* 3.5 3.1*  CL 103 104 107 107 109 108  CO2 23 22 22  21* 19* 22  GLUCOSE 85 78 82 81 79 84  BUN 7* 6* 6* <5* <5* 5*  CREATININE 0.48 0.49 0.36* 0.47 0.40* 0.38*  CALCIUM 7.8* 7.9* 8.0* 8.2* 8.3* 8.6*  MG 1.9  --  1.7  --  1.7  --    GFR: Estimated Creatinine Clearance: 78.6 mL/min (A) (by C-G formula based on SCr of 0.38 mg/dL (L)). Liver Function Tests: Recent Labs  Lab 05/23/20 0433 05/24/20 0323  05/25/20 0305 05/26/20 0422 05/27/20 0339  AST 48* 49* 50* 54* 59*  ALT 18 17 16 18 18   ALKPHOS 174* 182* 174* 176* 183*  BILITOT 1.9* 2.0* 2.0* 2.1* 2.6*  PROT 4.1* 4.1* 3.9* 4.1* 4.0*  ALBUMIN 2.2* 2.2* 2.1* 2.2* 2.2*   No results for input(s): LIPASE, AMYLASE in the last 168 hours. No results for input(s): AMMONIA in the last 168 hours. Coagulation Profile: No results for input(s): INR, PROTIME in the last 168 hours. Cardiac Enzymes: No results for input(s): CKTOTAL, CKMB, CKMBINDEX, TROPONINI in the last 168 hours. BNP (last 3 results) No results for input(s): PROBNP in the last 8760 hours. HbA1C: No results for input(s): HGBA1C in the last 72 hours. CBG: No results for input(s): GLUCAP in the last 168 hours. Lipid Profile: No results for input(s): CHOL, HDL, LDLCALC, TRIG, CHOLHDL, LDLDIRECT in the last 72 hours. Thyroid Function Tests: No results for input(s): TSH, T4TOTAL, FREET4, T3FREE,  THYROIDAB in the last 72 hours. Anemia Panel: No results for input(s): VITAMINB12, FOLATE, FERRITIN, TIBC, IRON, RETICCTPCT in the last 72 hours. Sepsis Labs: No results for input(s): PROCALCITON, LATICACIDVEN in the last 168 hours.  Recent Results (from the past 240 hour(s))  SARS CORONAVIRUS 2 (TAT 6-24 HRS) Nasopharyngeal Nasopharyngeal Swab     Status: None   Collection Time: 05/20/20 10:01 PM   Specimen: Nasopharyngeal Swab  Result Value Ref Range Status   SARS Coronavirus 2 NEGATIVE NEGATIVE Final    Comment: (NOTE) SARS-CoV-2 target nucleic acids are NOT DETECTED.  The SARS-CoV-2 RNA is generally detectable in upper and lower respiratory specimens during the acute phase of infection. Negative results do not preclude SARS-CoV-2 infection, do not rule out co-infections with other pathogens, and should not be used as the sole basis for treatment or other patient management decisions. Negative results must be combined with clinical observations, patient history, and  epidemiological information. The expected result is Negative.  Fact Sheet for Patients: SugarRoll.be  Fact Sheet for Healthcare Providers: https://www.-mathews.com/  This test is not yet approved or cleared by the [place] FDA and  has been authorized for detection and/or diagnosis of SARS-CoV-2 by FDA under an Emergency Use Authorization (EUA). This EUA will remain  in effect (meaning this test can be used) for the duration of the COVID-19 declaration under Se ction 564(b)(1) of the Act, 21 U.S.C. section 360bbb-3(b)(1), unless the authorization is terminated or revoked sooner.  Performed at [facility], [place] [address], [place], [place] [address]   SARS CORONAVIRUS 2 (TAT 6-24 HRS) Nasopharyngeal Nasopharyngeal Swab     Status: None   Collection Time: 05/23/20  1:24 PM   Specimen: Nasopharyngeal Swab  Result Value Ref Range Status   SARS Coronavirus 2 NEGATIVE NEGATIVE Final    Comment: (NOTE) SARS-CoV-2 target nucleic acids are NOT DETECTED.  The SARS-CoV-2 RNA is generally detectable in upper and lower respiratory specimens during the acute phase of infection. Negative results do not preclude SARS-CoV-2 infection, do not rule out co-infections with other pathogens, and should not be used as the sole basis for treatment or other patient management decisions. Negative results must be combined with clinical observations, patient history, and epidemiological information. The expected result is Negative.  Fact Sheet for Patients: SugarRoll.be  Fact Sheet for Healthcare Providers: https://www.-mathews.com/  This test is not yet approved or cleared by the [place] FDA and  has been authorized for detection and/or diagnosis of SARS-CoV-2 by FDA under an Emergency Use Authorization (EUA). This EUA will remain  in effect (meaning this test can be used) for the duration of  the COVID-19 declaration under Se ction 564(b)(1) of the Act, 21 U.S.C. section 360bbb-3(b)(1), unless the authorization is terminated or revoked sooner.  Performed at [facility], [place] [address], [place], [place] [address]          Radiology Studies: No results found.      Scheduled Meds: . (feeding supplement) PROSource Plus  30 mL Oral Daily  . anastrozole  1 mg Oral Daily  . Chlorhexidine Gluconate Cloth  6 each Topical Daily  . dronabinol  2.5 mg Oral QAC lunch  . enoxaparin (LOVENOX) injection  40 mg Subcutaneous Q24H  . feeding supplement  1 Container Oral Q24H  . feeding supplement  237 mL Oral Q24H  . losartan  12.5 mg Oral Daily  . multivitamin  15 mL Oral Daily  . palbociclib  75 mg Oral Daily  .  vitamin B-6  100 mg Oral Daily  . sodium chloride flush  10-40 mL Intracatheter Q12H  . thiamine  100 mg Oral Daily   Continuous Infusions:   LOS: 9 days    Time spent:40 min    Teague Goynes, Geraldo Docker, MD [facility]   If 7PM-7AM, please contact night-coverage 05/29/2020, 9:26 AM

## 2020-05-29 NOTE — TOC Progression Note (Signed)
Transition of Care [facility]) - Progression Note    Patient Details  Name: Brooke [name] MRN: [id number] Date of Birth: 1950/06/19  Transition of [facility]) CM/SW Contact  [name], [place] Phone Number: 05/29/2020, 6:11 PM  Clinical Narrative:     CSW spoke to appeal department at [phone] call id reference [id number].  CSW was informed that insurance company's denial was overturned by appeal and she has been approved for SNF placement at [name].  Per appeal number, decision was shown that it is medically necessary for patient to go to SNF to continue with her rehab.  Reference number is [id number], it is valid through April 5th, contact number for review is [phone].  CSW attempted to contact [name] to see if they can accept patient over weekend, CSW had to leave a voice mail and provided a text to admissions worker to call weekend social worker if they can accept.  CSW updated bedside nurse, physican, patient, and patient's [name] [phone].  Patient was very excited, CSW stressed to patient how important it is to continue to work hard with therapy and to take pain medication before therapy sessions.  CSW to continue to follow.   Expected Discharge Plan: [place] Barriers to Discharge: Continued Medical Work up  Expected Discharge Plan and Services Expected Discharge Plan: [place]   Discharge Planning Services: CM Consult   Living arrangements for the past 2 months: [place]                                       Social Determinants of Health (SDOH) Interventions    Readmission Risk Interventions Readmission Risk Prevention Plan 05/04/2020  Transportation Screening Complete  PCP or Specialist Appt within 3-5 Days Complete  HRI or [place] Complete  Social Work Consult for [place] Planning/Counseling Complete  Palliative Care Screening Complete  Medication Review  Press photographer) Complete  Some recent data might be hidden

## 2020-05-30 DIAGNOSIS — E440 Moderate protein-calorie malnutrition: Secondary | ICD-10-CM

## 2020-05-30 LAB — COMPREHENSIVE METABOLIC PANEL
ALT: 19 U/L (ref 0–44)
AST: 55 U/L — ABNORMAL HIGH (ref 15–41)
Albumin: 1.9 g/dL — ABNORMAL LOW (ref 3.5–5.0)
Alkaline Phosphatase: 154 U/L — ABNORMAL HIGH (ref 38–126)
Anion gap: 10 (ref 5–15)
BUN: <5 mg/dL — ABNORMAL LOW (ref 8–23)
CO2: 23 mmol/L (ref 22–32)
Calcium: 8.6 mg/dL — ABNORMAL LOW (ref 8.9–10.3)
Chloride: 108 mmol/L (ref 98–111)
Creatinine, Ser: 0.42 mg/dL — ABNORMAL LOW (ref 0.44–1.00)
GFR, Estimated: >60 mL/min (ref >60)
Glucose, Bld: 86 mg/dL (ref 70–99)
Potassium: 2.9 mmol/L — ABNORMAL LOW (ref 3.5–5.1)
Sodium: 141 mmol/L (ref 135–145)
Total Bilirubin: 2.1 mg/dL — ABNORMAL HIGH (ref 0.3–1.2)
Total Protein: 3.8 g/dL — ABNORMAL LOW (ref 6.5–8.1)

## 2020-05-30 LAB — MAGNESIUM: Magnesium: 1.6 mg/dL — ABNORMAL LOW (ref 1.7–2.4)

## 2020-05-30 LAB — CBC WITH DIFFERENTIAL/PLATELET
Abs Immature Granulocytes: 0.04 10*3/uL (ref 0.00–0.07)
Basophils Absolute: 0 10*3/uL (ref 0.0–0.1)
Basophils Relative: 1 %
Eosinophils Absolute: 0 10*3/uL (ref 0.0–0.5)
Eosinophils Relative: 1 %
HCT: 26.1 % — ABNORMAL LOW (ref 36.0–46.0)
Hemoglobin: 8.1 g/dL — ABNORMAL LOW (ref 12.0–15.0)
Immature Granulocytes: 1 %
Lymphocytes Relative: 16 %
Lymphs Abs: 0.6 10*3/uL — ABNORMAL LOW (ref 0.7–4.0)
MCH: 32 pg (ref 26.0–34.0)
MCHC: 31 g/dL (ref 30.0–36.0)
MCV: 103.2 fL — ABNORMAL HIGH (ref 80.0–100.0)
Monocytes Absolute: 0.4 10*3/uL (ref 0.1–1.0)
Monocytes Relative: 11 %
Neutro Abs: 2.4 10*3/uL (ref 1.7–7.7)
Neutrophils Relative %: 70 %
Platelets: 135 10*3/uL — ABNORMAL LOW (ref 150–400)
RBC: 2.53 MIL/uL — ABNORMAL LOW (ref 3.87–5.11)
RDW: 28.1 % — ABNORMAL HIGH (ref 11.5–15.5)
WBC: 3.5 10*3/uL — ABNORMAL LOW (ref 4.0–10.5)
nRBC: 0 % (ref 0.0–0.2)

## 2020-05-30 LAB — SARS CORONAVIRUS 2 (TAT 6-24 HRS): SARS Coronavirus 2: NEGATIVE

## 2020-05-30 LAB — PHOSPHORUS: Phosphorus: 3.4 mg/dL (ref 2.5–4.6)

## 2020-05-30 MED ORDER — MAGNESIUM SULFATE 50 % IJ SOLN: 3.0000 g | Freq: Once | INTRAVENOUS | Status: DC

## 2020-05-30 MED ORDER — MAGNESIUM SULFATE 2 GM/50ML IV SOLN
2.0000 g | Freq: Once | INTRAVENOUS | Status: AC
  Administered 2020-05-30: 2 g via INTRAVENOUS
  Filled 2020-05-30: qty 50

## 2020-05-30 MED ORDER — POTASSIUM CHLORIDE 10 MEQ/100ML IV SOLN
10.0000 meq | INTRAVENOUS | Status: AC
  Administered 2020-05-30 (×6): 10 meq via INTRAVENOUS
  Filled 2020-05-30 (×2): qty 100

## 2020-05-30 MED ORDER — MAGNESIUM SULFATE IN D5W 1-5 GM/100ML-% IV SOLN
1.0000 g | Freq: Once | INTRAVENOUS | Status: AC
  Administered 2020-05-30: 1 g via INTRAVENOUS
  Filled 2020-05-30: qty 100

## 2020-05-30 NOTE — Progress Notes (Signed)
PROGRESS NOTE    Brooke [name]  QPY:[id number] DOB: 1950-05-19 DOA: 05/20/2020 PCP: Brooke Macadam, MD     Brief Narrative:  70 y.o.WF PMHx HTN,metastatic breast cancer. She has been receiving chemotherapy and is followed by Dr. [name].   Patient has had progressive weakness was residing at a nursing facility. Patient had outpatient laboratory tests on March 13and results reportedand she was noted to haveundectablevitamin B1 levels. Patient's B12 level is also decreased. Patient was called with the result and instructed to come to the ED because she needs IV replacement therapy. Dr. [name] called the ED and stated the patient needs 500 mg of IV thiamine 3 times daily for the next 3 days.She was also reportedly seen by neurology recently for intermittent confusion and neurology recommended the IV thiamine replacement protocol   Subjective: 4/2 afebrile overnight/O x4, negative S OB.  Minimal abdominal pain.  Patient happy that insurance company reversed their decision on allowing her to return to SNF    Assessment & Plan: Covid vaccination;   Principal Problem:   Thiamine deficiency Active Problems:   Metastatic malignant neoplasm (HCC)   Hypokalemia   Anemia, chronic disease   Essential hypertension   Thiamine deficiency with weakness -Brooke [name] is admitted per request from Hematology service, ( Dr. [name]) -Presented with markedly low thiamine level, requiring IV thiamine course, now  transitioned to PO thiamine to complete replenishment -Per SW, insurance has denied return to SNF. Family appeal currently in progress.  TOC team and Dr. [name] assisting.  TOC team has faxed over a large volume/"almost 160 pages" of paperwork.   -4/1 decision still pending.  Hypokalemia -Potassium goal> 4 -4/2 potassium IV 60 mEq  Hypomagnesmia -Magnesium goal> 2 -Magnesium IV 3 g  Metastatic malignant neoplasm  -Per EMR previous physician discussed  case with Oncology, Dr. [name].     Anemia, chronic disease -Noted to be chronic and stable.  Lab Results  Component Value Date   HGB 8.1 (L) 05/30/2020   HGB 7.9 (L) 05/29/2020   HGB 8.4 (L) 05/27/2020   HGB 8.3 (L) 05/25/2020   HGB 8.3 (L) 05/22/2020  -Slowly trending down -4/1 no active sites of bleeding -All blood lines are trending down so may be delusional -Occult blood pending -Transfuse for hemoglobin<7  Essential HTN -Cozaar 12.5 mg daily  Dysphagia -Pt continues to have difficulty with tolerating PO intake -Cleared for regular diet with thin liquids per SLP -Continue to encourage PO as tolerated  Moderate protein calorie malnutrition -minimal PO intake recently -Pt reports not tolerating PO intake recently -Continue to encourage PO as tolerated    DVT prophylaxis: Lovenox Code Status: Full Family Communication:  Status is: Inpatient    Dispo: The patient is from: SNF              Anticipated d/c is to: SNF              Anticipated d/c date is: SNF placement pending; insurance denial appealed awaiting findings              Patient currently stable      Consultants:    Procedures/Significant Events:    I have personally reviewed and interpreted all radiology studies and my findings are as above.  VENTILATOR SETTINGS:    Cultures   Antimicrobials:    Devices    LINES / TUBES:      Continuous Infusions:   Objective: Vitals:   05/29/20 0434 05/29/20 0500 05/29/20 1304 05/29/20 2237  BP: (!) 162/77 135/72 (!) 150/65 (!) 142/60  Pulse: 96 84 93 92  Resp: 20 20 20    Temp: 98 F (36.7 C) 98 F (36.7 C) 98 F (36.7 C) 97.7 F (36.5 C)  TempSrc: Oral Oral Oral Oral  SpO2: 95% 98% 96% 97%  Weight:      Height:        Intake/Output Summary (Last 24 hours) at 05/30/2020 1025 Last data filed at 05/30/2020 0530 Gross per 24 hour  Intake 100 ml  Output 500 ml  Net -400 ml   Filed Weights   05/20/20 1932 05/28/20 0956   Weight: 82.6 kg 88.2 kg    Examination:  General: A/O x4 No acute respiratory distress Eyes: negative scleral hemorrhage, negative anisocoria, negative icterus ENT: Negative Runny nose, negative gingival bleeding, Neck:  Negative scars, masses, torticollis, lymphadenopathy, JVD Lungs: Clear to auscultation bilaterally without wheezes or crackles Cardiovascular: Regular rate and rhythm without murmur gallop or rub normal S1 and S2 left total mastectomy Abdomen: negative abdominal pain, nondistended, positive soft, bowel sounds, no rebound, no ascites, no appreciable mass Extremities: No significant cyanosis, clubbing, or edema bilateral lower extremities Skin: Negative rashes, lesions, ulcers Psychiatric:  Negative depression, negative anxiety, negative fatigue, negative mania  Central nervous system:  Cranial nerves II through XII intact, tongue/uvula midline, all extremities muscle strength 5/5, sensation intact throughout, negative dysarthria, negative expressive aphasia, negative receptive aphasia.  .     Data Reviewed: Care during the described time interval was provided by me .  I have reviewed this patient's available data, including medical history, events of note, physical examination, and all test results as part of my evaluation.  CBC: Recent Labs  Lab 05/25/20 0305 05/27/20 0339 05/29/20 0303 05/30/20 0429  WBC 4.3 4.4 3.8* 3.5*  NEUTROABS  --   --   --  2.4  HGB 8.3* 8.4* 7.9* 8.1*  HCT 26.9* 26.8* 25.2* 26.1*  MCV 100.7* 101.9* 102.9* 103.2*  PLT 188 186 146* 626*   Basic Metabolic Panel: Recent Labs  Lab 05/25/20 0305 05/26/20 0422 05/27/20 0339 05/29/20 0303 05/30/20 0429  NA 137 140 140 140 141  K 3.6 3.4* 3.5 3.1* 2.9*  CL 107 107 109 108 108  CO2 22 21* 19* 22 23  GLUCOSE 82 81 79 84 86  BUN 6* <5* <5* 5* <5*  CREATININE 0.36* 0.47 0.40* 0.38* 0.42*  CALCIUM 8.0* 8.2* 8.3* 8.6* 8.6*  MG 1.7  --  1.7  --  1.6*  PHOS  --   --   --   --  3.4    GFR: Estimated Creatinine Clearance: 78.6 mL/min (A) (by C-G formula based on SCr of 0.42 mg/dL (L)). Liver Function Tests: Recent Labs  Lab 05/24/20 0323 05/25/20 0305 05/26/20 0422 05/27/20 0339 05/30/20 0429  AST 49* 50* 54* 59* 55*  ALT 17 16 18 18 19   ALKPHOS 182* 174* 176* 183* 154*  BILITOT 2.0* 2.0* 2.1* 2.6* 2.1*  PROT 4.1* 3.9* 4.1* 4.0* 3.8*  ALBUMIN 2.2* 2.1* 2.2* 2.2* 1.9*   No results for input(s): LIPASE, AMYLASE in the last 168 hours. No results for input(s): AMMONIA in the last 168 hours. Coagulation Profile: No results for input(s): INR, PROTIME in the last 168 hours. Cardiac Enzymes: No results for input(s): CKTOTAL, CKMB, CKMBINDEX, TROPONINI in the last 168 hours. BNP (last 3 results) No results for input(s): PROBNP in the last 8760 hours. HbA1C: No results for input(s): HGBA1C in the last 72 hours. CBG:  No results for input(s): GLUCAP in the last 168 hours. Lipid Profile: No results for input(s): CHOL, HDL, LDLCALC, TRIG, CHOLHDL, LDLDIRECT in the last 72 hours. Thyroid Function Tests: No results for input(s): TSH, T4TOTAL, FREET4, T3FREE, THYROIDAB in the last 72 hours. Anemia Panel: No results for input(s): VITAMINB12, FOLATE, FERRITIN, TIBC, IRON, RETICCTPCT in the last 72 hours. Sepsis Labs: No results for input(s): PROCALCITON, LATICACIDVEN in the last 168 hours.  Recent Results (from the past 240 hour(s))  SARS CORONAVIRUS 2 (TAT 6-24 HRS) Nasopharyngeal Nasopharyngeal Swab     Status: None   Collection Time: 05/20/20 10:01 PM   Specimen: Nasopharyngeal Swab  Result Value Ref Range Status   SARS Coronavirus 2 NEGATIVE NEGATIVE Final    Comment: (NOTE) SARS-CoV-2 target nucleic acids are NOT DETECTED.  The SARS-CoV-2 RNA is generally detectable in upper and lower respiratory specimens during the acute phase of infection. Negative results do not preclude SARS-CoV-2 infection, do not rule out co-infections with other pathogens, and should  not be used as the sole basis for treatment or other patient management decisions. Negative results must be combined with clinical observations, patient history, and epidemiological information. The expected result is Negative.  Fact Sheet for Patients: SugarRoll.be  Fact Sheet for Healthcare Providers: https://www.-mathews.com/  This test is not yet approved or cleared by the [place] FDA and  has been authorized for detection and/or diagnosis of SARS-CoV-2 by FDA under an Emergency Use Authorization (EUA). This EUA will remain  in effect (meaning this test can be used) for the duration of the COVID-19 declaration under Se ction 564(b)(1) of the Act, 21 U.S.C. section 360bbb-3(b)(1), unless the authorization is terminated or revoked sooner.  Performed at [facility], [place] [address], [place], [place] [address]   SARS CORONAVIRUS 2 (TAT 6-24 HRS) Nasopharyngeal Nasopharyngeal Swab     Status: None   Collection Time: 05/23/20  1:24 PM   Specimen: Nasopharyngeal Swab  Result Value Ref Range Status   SARS Coronavirus 2 NEGATIVE NEGATIVE Final    Comment: (NOTE) SARS-CoV-2 target nucleic acids are NOT DETECTED.  The SARS-CoV-2 RNA is generally detectable in upper and lower respiratory specimens during the acute phase of infection. Negative results do not preclude SARS-CoV-2 infection, do not rule out co-infections with other pathogens, and should not be used as the sole basis for treatment or other patient management decisions. Negative results must be combined with clinical observations, patient history, and epidemiological information. The expected result is Negative.  Fact Sheet for Patients: SugarRoll.be  Fact Sheet for Healthcare Providers: https://www.-mathews.com/  This test is not yet approved or cleared by the [place] FDA and  has been authorized for detection  and/or diagnosis of SARS-CoV-2 by FDA under an Emergency Use Authorization (EUA). This EUA will remain  in effect (meaning this test can be used) for the duration of the COVID-19 declaration under Se ction 564(b)(1) of the Act, 21 U.S.C. section 360bbb-3(b)(1), unless the authorization is terminated or revoked sooner.  Performed at [facility], [place] [address], [place], [place] [address]          Radiology Studies: No results found.      Scheduled Meds: . (feeding supplement) PROSource Plus  30 mL Oral Daily  . anastrozole  1 mg Oral Daily  . Chlorhexidine Gluconate Cloth  6 each Topical Daily  . dronabinol  2.5 mg Oral QAC lunch  . enoxaparin (LOVENOX) injection  40 mg Subcutaneous Q24H  . feeding supplement  1 Container Oral Q24H  . feeding supplement  237 mL Oral Q24H  . losartan  12.5 mg Oral Daily  . multivitamin  15 mL Oral Daily  . palbociclib  75 mg Oral Daily  . vitamin B-6  100 mg Oral Daily  . sodium chloride flush  10-40 mL Intracatheter Q12H  . thiamine  100 mg Oral Daily   Continuous Infusions:   LOS: 10 days    Time spent:40 min    Jameison Haji, Geraldo Docker, MD [facility]   If 7PM-7AM, please contact night-coverage 05/30/2020, 10:25 AM

## 2020-05-31 DIAGNOSIS — I10 Essential (primary) hypertension: Secondary | ICD-10-CM | POA: Diagnosis not present

## 2020-05-31 DIAGNOSIS — N390 Urinary tract infection, site not specified: Secondary | ICD-10-CM | POA: Diagnosis not present

## 2020-05-31 DIAGNOSIS — E46 Unspecified protein-calorie malnutrition: Secondary | ICD-10-CM | POA: Diagnosis not present

## 2020-05-31 DIAGNOSIS — C799 Secondary malignant neoplasm of unspecified site: Secondary | ICD-10-CM | POA: Diagnosis not present

## 2020-05-31 DIAGNOSIS — Z515 Encounter for palliative care: Secondary | ICD-10-CM | POA: Diagnosis not present

## 2020-05-31 DIAGNOSIS — D519 Vitamin B12 deficiency anemia, unspecified: Secondary | ICD-10-CM | POA: Diagnosis not present

## 2020-05-31 DIAGNOSIS — E440 Moderate protein-calorie malnutrition: Secondary | ICD-10-CM | POA: Diagnosis not present

## 2020-05-31 DIAGNOSIS — C50912 Malignant neoplasm of unspecified site of left female breast: Secondary | ICD-10-CM | POA: Diagnosis present

## 2020-05-31 DIAGNOSIS — D638 Anemia in other chronic diseases classified elsewhere: Secondary | ICD-10-CM | POA: Diagnosis not present

## 2020-05-31 DIAGNOSIS — C7989 Secondary malignant neoplasm of other specified sites: Secondary | ICD-10-CM | POA: Diagnosis not present

## 2020-05-31 DIAGNOSIS — C50812 Malignant neoplasm of overlapping sites of left female breast: Secondary | ICD-10-CM | POA: Diagnosis not present

## 2020-05-31 DIAGNOSIS — F419 Anxiety disorder, unspecified: Secondary | ICD-10-CM | POA: Diagnosis not present

## 2020-05-31 DIAGNOSIS — C50919 Malignant neoplasm of unspecified site of unspecified female breast: Secondary | ICD-10-CM | POA: Diagnosis not present

## 2020-05-31 DIAGNOSIS — Z170 Estrogen receptor positive status [ER+]: Secondary | ICD-10-CM | POA: Diagnosis not present

## 2020-05-31 DIAGNOSIS — E876 Hypokalemia: Secondary | ICD-10-CM | POA: Diagnosis not present

## 2020-05-31 DIAGNOSIS — D649 Anemia, unspecified: Secondary | ICD-10-CM | POA: Diagnosis not present

## 2020-05-31 DIAGNOSIS — L89324 Pressure ulcer of left buttock, stage 4: Secondary | ICD-10-CM | POA: Diagnosis not present

## 2020-05-31 DIAGNOSIS — Z79811 Long term (current) use of aromatase inhibitors: Secondary | ICD-10-CM | POA: Diagnosis not present

## 2020-05-31 DIAGNOSIS — D696 Thrombocytopenia, unspecified: Secondary | ICD-10-CM | POA: Diagnosis not present

## 2020-05-31 DIAGNOSIS — R630 Anorexia: Secondary | ICD-10-CM | POA: Diagnosis not present

## 2020-05-31 DIAGNOSIS — Z7401 Bed confinement status: Secondary | ICD-10-CM | POA: Diagnosis not present

## 2020-05-31 DIAGNOSIS — R5381 Other malaise: Secondary | ICD-10-CM | POA: Diagnosis not present

## 2020-05-31 DIAGNOSIS — A1813 Tuberculosis of other urinary organs: Secondary | ICD-10-CM | POA: Diagnosis not present

## 2020-05-31 DIAGNOSIS — R1310 Dysphagia, unspecified: Secondary | ICD-10-CM | POA: Diagnosis not present

## 2020-05-31 DIAGNOSIS — Z9012 Acquired absence of left breast and nipple: Secondary | ICD-10-CM | POA: Diagnosis not present

## 2020-05-31 DIAGNOSIS — M6281 Muscle weakness (generalized): Secondary | ICD-10-CM | POA: Diagnosis not present

## 2020-05-31 DIAGNOSIS — L89154 Pressure ulcer of sacral region, stage 4: Secondary | ICD-10-CM | POA: Diagnosis not present

## 2020-05-31 DIAGNOSIS — R600 Localized edema: Secondary | ICD-10-CM | POA: Diagnosis not present

## 2020-05-31 DIAGNOSIS — M2550 Pain in unspecified joint: Secondary | ICD-10-CM | POA: Diagnosis not present

## 2020-05-31 DIAGNOSIS — R609 Edema, unspecified: Secondary | ICD-10-CM | POA: Diagnosis not present

## 2020-05-31 DIAGNOSIS — C7951 Secondary malignant neoplasm of bone: Secondary | ICD-10-CM | POA: Diagnosis present

## 2020-05-31 DIAGNOSIS — F29 Unspecified psychosis not due to a substance or known physiological condition: Secondary | ICD-10-CM | POA: Diagnosis not present

## 2020-05-31 DIAGNOSIS — Z79899 Other long term (current) drug therapy: Secondary | ICD-10-CM | POA: Diagnosis not present

## 2020-05-31 DIAGNOSIS — Z452 Encounter for adjustment and management of vascular access device: Secondary | ICD-10-CM | POA: Diagnosis present

## 2020-05-31 DIAGNOSIS — R339 Retention of urine, unspecified: Secondary | ICD-10-CM | POA: Diagnosis not present

## 2020-05-31 DIAGNOSIS — E519 Thiamine deficiency, unspecified: Secondary | ICD-10-CM | POA: Diagnosis not present

## 2020-05-31 DIAGNOSIS — L8990 Pressure ulcer of unspecified site, unspecified stage: Secondary | ICD-10-CM | POA: Diagnosis not present

## 2020-05-31 LAB — COMPREHENSIVE METABOLIC PANEL
ALT: 18 U/L (ref 0–44)
AST: 56 U/L — ABNORMAL HIGH (ref 15–41)
Albumin: 2 g/dL — ABNORMAL LOW (ref 3.5–5.0)
Alkaline Phosphatase: 177 U/L — ABNORMAL HIGH (ref 38–126)
Anion gap: 10 (ref 5–15)
BUN: 6 mg/dL — ABNORMAL LOW (ref 8–23)
CO2: 22 mmol/L (ref 22–32)
Calcium: 8.3 mg/dL — ABNORMAL LOW (ref 8.9–10.3)
Chloride: 105 mmol/L (ref 98–111)
Creatinine, Ser: 0.5 mg/dL (ref 0.44–1.00)
GFR, Estimated: >60 mL/min (ref >60)
Glucose, Bld: 85 mg/dL (ref 70–99)
Potassium: 3.4 mmol/L — ABNORMAL LOW (ref 3.5–5.1)
Sodium: 137 mmol/L (ref 135–145)
Total Bilirubin: 1.7 mg/dL — ABNORMAL HIGH (ref 0.3–1.2)
Total Protein: 3.8 g/dL — ABNORMAL LOW (ref 6.5–8.1)

## 2020-05-31 LAB — CBC WITH DIFFERENTIAL/PLATELET
Abs Immature Granulocytes: 0.03 10*3/uL (ref 0.00–0.07)
Basophils Absolute: 0 10*3/uL (ref 0.0–0.1)
Basophils Relative: 1 %
Eosinophils Absolute: 0 10*3/uL (ref 0.0–0.5)
Eosinophils Relative: 1 %
HCT: 26 % — ABNORMAL LOW (ref 36.0–46.0)
Hemoglobin: 8.2 g/dL — ABNORMAL LOW (ref 12.0–15.0)
Immature Granulocytes: 1 %
Lymphocytes Relative: 18 %
Lymphs Abs: 0.6 10*3/uL — ABNORMAL LOW (ref 0.7–4.0)
MCH: 32.9 pg (ref 26.0–34.0)
MCHC: 31.5 g/dL (ref 30.0–36.0)
MCV: 104.4 fL — ABNORMAL HIGH (ref 80.0–100.0)
Monocytes Absolute: 0.3 10*3/uL (ref 0.1–1.0)
Monocytes Relative: 10 %
Neutro Abs: 2.5 10*3/uL (ref 1.7–7.7)
Neutrophils Relative %: 69 %
Platelets: 131 10*3/uL — ABNORMAL LOW (ref 150–400)
RBC: 2.49 MIL/uL — ABNORMAL LOW (ref 3.87–5.11)
RDW: 28.1 % — ABNORMAL HIGH (ref 11.5–15.5)
WBC: 3.5 10*3/uL — ABNORMAL LOW (ref 4.0–10.5)
nRBC: 0 % (ref 0.0–0.2)

## 2020-05-31 LAB — MAGNESIUM: Magnesium: 2.1 mg/dL (ref 1.7–2.4)

## 2020-05-31 LAB — PHOSPHORUS: Phosphorus: 3.3 mg/dL (ref 2.5–4.6)

## 2020-05-31 MED ORDER — LOSARTAN POTASSIUM 25 MG PO TABS: 25.0000 mg | ORAL_TABLET | Freq: Every day | ORAL | 0 refills | Status: AC

## 2020-05-31 MED ORDER — HEPARIN SOD (PORK) LOCK FLUSH 100 UNIT/ML IV SOLN
250.0000 [IU] | INTRAVENOUS | Status: AC | PRN
  Administered 2020-05-31: 250 [IU]
  Filled 2020-05-31: qty 2.5

## 2020-05-31 MED ORDER — TAMSULOSIN HCL 0.4 MG PO CAPS
0.4000 mg | ORAL_CAPSULE | Freq: Every day | ORAL | Status: DC
  Administered 2020-05-31: 0.4 mg via ORAL
  Filled 2020-05-31: qty 1

## 2020-05-31 MED ORDER — PYRIDOXINE HCL 100 MG PO TABS: 100.0000 mg | ORAL_TABLET | Freq: Every day | ORAL | 0 refills | Status: AC

## 2020-05-31 MED ORDER — ENSURE ENLIVE PO LIQD: 237.0000 mL | ORAL | 0 refills | Status: AC

## 2020-05-31 MED ORDER — OXYCODONE-ACETAMINOPHEN 5-325 MG PO TABS: 1.0000 | ORAL_TABLET | ORAL | 0 refills | Status: AC | PRN

## 2020-05-31 MED ORDER — PROSOURCE PLUS PO LIQD: 30.0000 mL | Freq: Every day | ORAL | 0 refills | Status: AC

## 2020-05-31 MED ORDER — LOSARTAN POTASSIUM 25 MG PO TABS
25.0000 mg | ORAL_TABLET | Freq: Every day | ORAL | Status: DC
  Administered 2020-05-31: 25 mg via ORAL
  Filled 2020-05-31: qty 1

## 2020-05-31 MED ORDER — THIAMINE HCL 100 MG PO TABS: 100.0000 mg | ORAL_TABLET | Freq: Every day | ORAL | 0 refills | Status: AC

## 2020-05-31 MED ORDER — ACETAMINOPHEN 325 MG PO TABS: 650.0000 mg | ORAL_TABLET | Freq: Four times a day (QID) | ORAL | 0 refills | Status: AC | PRN

## 2020-05-31 MED ORDER — POTASSIUM CHLORIDE 10 MEQ/100ML IV SOLN
10.0000 meq | INTRAVENOUS | Status: AC
  Administered 2020-05-31 (×5): 10 meq via INTRAVENOUS
  Filled 2020-05-31 (×5): qty 100

## 2020-05-31 MED ORDER — ADULT MULTIVITAMIN LIQUID CH: 15.0000 mL | Freq: Every day | ORAL | 0 refills | Status: AC

## 2020-05-31 NOTE — TOC Transition Note (Signed)
Transition of Care [facility]) - CM/SW Discharge Note   Patient Details  Name: Brooke [name] MRN: [id number] Date of Birth: February 22, 1951  Transition of Care [facility]) CM/SW Contact:  Ova Freshwater Phone Number: [phone] 05/31/2020, 11:11 AM   Clinical Narrative:     Patient will d/c to [facility] SNF room 3238, report [phone]. [facility] transportation has been requested for 3:00PM pick up.  Forms and DNR in folder. CSW HCPOA has been contacted and voicemail left requesting a return call.  Attending and Unit RN have been updated.  Final next level of care: [place] Barriers to Discharge: Continued Medical Work up   Patient Goals and CMS Choice Patient states their goals for this hospitalization and ongoing recovery are:: return to [employer] Medicare.gov Compare Post Acute Care list provided to:: Patient Choice offered to / list presented to : Patient  Discharge Placement                       Discharge Plan and Services   Discharge Planning Services: CM Consult                                 Social Determinants of Health (SDOH) Interventions     Readmission Risk Interventions Readmission Risk Prevention Plan 05/04/2020  Transportation Screening Complete  PCP or Specialist Appt within 3-5 Days Complete  HRI or [place] Complete  Social Work Consult for [place] Planning/Counseling Complete  Palliative Care Screening Complete  Medication Review Press photographer) Complete  Some recent data might be hidden

## 2020-05-31 NOTE — Progress Notes (Signed)
RN received call from Sharolyn Douglas, LPN at [facility] to get report on [name]. All questions addressed. Pt will go to facility with PICC and Foley. No further needs at this time.

## 2020-05-31 NOTE — Progress Notes (Signed)
Occupational Therapy Treatment Patient Details Name: Brooke [name] MRN: [id number] DOB: May 30, 1950 Today's Date: 05/31/2020    History of present illness Pt is 70 y.o. female admitted on 05/20/20 with thiamine deficiency and hypokalemia.  Pt has been receiving chemotherapy. She had recent hospital admission and went to SNF for therapy.  Pt with medical history significant for HTN, metastatic breast cancer.   OT comments  Patient requiring max A x2 for bed mobility to complete log roll to minimize back pain/strain. Initially patient with posterior lean, needs max cues for hand placement and to scoot out to edge of bed to place feet onto the floor. With encouragement patient able to brush her teeth, wash her face and brush hair seated edge of bed with unilateral upper extremity support. Patient asking "is that enough?" Patient needing max A x2 to return EOB. Cont with POC   Follow Up Recommendations  SNF    Equipment Recommendations  3 in 1 bedside commode;Tub/shower bench       Precautions / Restrictions Precautions Precautions: Fall Precaution Comments: anxiety; pre-medicate if possible, lymphedema in LUE Restrictions Weight Bearing Restrictions: No       Mobility Bed Mobility Overal bed mobility: Needs Assistance Bed Mobility: Rolling;Sidelying to Sit;Sit to Sidelying Rolling: Mod assist Sidelying to sit: Max assist;+2 for safety/equipment     Sit to sidelying: Max assist;+2 for physical assistance General bed mobility comments: patient needs step by step cues to attempt log roll to minimize back pain/strain, patient needing max A to manage legs and to upright trunk to sitting. Patient needs increased time and max cues for hand placement and to scoot out to edge of bed in order to obtain balance. Initial posterior lean       Balance Overall balance assessment: Needs assistance Sitting-balance support: Single extremity supported;Bilateral upper extremity supported;Feet  supported Sitting balance-Leahy Scale: Fair Sitting balance - Comments: patient able to briefly let go with B UEs to perform seated ADL tasks, prefers having hand on bedside table to assist with stabilization                                   ADL either performed or assessed with clinical judgement   ADL Overall ADL's : Needs assistance/impaired     Grooming: Oral care;Wash/dry face;Brushing hair;Set up;Sitting;Supervision/safety Grooming Details (indicate cue type and reason): provide encouragement to perform additional ADL tasks while seated edge of bed patient stating "is that enough?" after brushing her teeth. educate patient important to increase out of bed activity tolerance in order to increase strength and independence with self care                                               Cognition Arousal/Alertness: Awake/alert Behavior During Therapy: Anxious;Flat affect Overall Cognitive Status: Impaired/Different from baseline Area of Impairment: Following commands                       Following Commands: Follows one step commands with increased time                           Pertinent Vitals/ Pain       Pain Assessment: Faces Faces Pain Scale: Hurts even more Pain Location: back Pain  Descriptors / Indicators: Moaning;Grimacing;Sore Pain Intervention(s): Premedicated before session         Frequency  Min 2X/week        Progress Toward Goals  OT Goals(current goals can now be found in the care plan section)  Progress towards OT goals: Progressing toward goals  Acute Rehab OT Goals Patient Stated Goal: "I don't know" OT Goal Formulation: With patient Time For Goal Achievement: 06/11/20 Potential to Achieve Goals: Fair ADL Goals Pt Will Perform Upper Body Bathing: with set-up;sitting Pt Will Perform Upper Body Dressing: with set-up;sitting Pt Will Transfer to Toilet: with mod assist;stand pivot transfer;bedside  commode Pt Will Perform Toileting - Clothing Manipulation and hygiene: with mod assist;sit to/from stand Pt/caregiver will Perform Home Exercise Program: Right Upper extremity;Left upper extremity;With theraband Additional ADL Goal #1: Patient will stand at sink to perform grooming task as evidence of improving activity tolerance Additional ADL Goal #2: Patient will sit edge of bed x 10 minutes as evidence of improving tolerance and balance  Plan Discharge plan remains appropriate       AM-PAC OT "6 Clicks" Daily Activity     Outcome Measure   Help from another person eating meals?: None Help from another person taking care of personal grooming?: A Little Help from another person toileting, which includes using toliet, bedpan, or urinal?: Total Help from another person bathing (including washing, rinsing, drying)?: A Lot Help from another person to put on and taking off regular upper body clothing?: A Lot Help from another person to put on and taking off regular lower body clothing?: Total 6 Click Score: 13    End of Session Equipment Utilized During Treatment: Oxygen  OT Visit Diagnosis: Unsteadiness on feet (R26.81);Muscle weakness (generalized) (M62.81);Pain;Adult, failure to thrive (R62.7) Pain - part of body:  (back)   Activity Tolerance Patient limited by fatigue   Patient Left in bed;with call bell/phone within reach;with bed alarm set   Nurse Communication Mobility status        Time: 1000-1023 OT Time Calculation (min): 23 min  Charges: OT General Charges $OT Visit: 1 Visit OT Treatments $Self Care/Home Management : 23-37 mins  Delbert Phenix OT OT pager: [place] 05/31/2020, 11:59 AM

## 2020-05-31 NOTE — TOC Progression Note (Signed)
Transition of Care [facility]) - Progression Note    Patient Details  Name: Brooke [name] MRN: [id number] Date of Birth: February 23, 1951  Transition of Care [facility]) CM/SW [place], [place] Phone Number: [phone] 05/31/2020, 9:15 AM  Clinical Narrative:     Anticipated discharge for today 05/31/2020.  CSW called Narda Rutherford at [facility] [phone] and was unable to speak with anyone and voicemail was not  Available.  CSW will try again later this morning.   Expected Discharge Plan: Skilled Nursing Facility Barriers to Discharge: Continued Medical Work up  Expected Discharge Plan and Services Expected Discharge Plan: [place]   Discharge Planning Services: CM Consult   Living arrangements for the past 2 months: [place] Expected Discharge Date: 05/31/20                                     Social Determinants of Health (SDOH) Interventions    Readmission Risk Interventions Readmission Risk Prevention Plan 05/04/2020  Transportation Screening Complete  PCP or Specialist Appt within 3-5 Days Complete  HRI or [place] Complete  Social Work Consult for [place] Planning/Counseling Complete  Palliative Care Screening Complete  Medication Review Press photographer) Complete  Some recent data might be hidden

## 2020-05-31 NOTE — Discharge Summary (Signed)
Physician Discharge Summary  Brooke [name] WCB:[id number] DOB: [birth year] DOA: 05/20/2020  PCP: [name], MD  Admit date: 05/20/2020 Discharge date: 05/31/2020  Time spent: 35 minutes  Recommendations for Outpatient Follow-up:   Thiamine deficiency with weakness -[name] is admitted per request from Hematology service, ( Dr. [name]) -Presented with markedly low thiamine level, requiring IV thiamine course, now transitioned to PO thiamine to complete replenishment -Per SW, insurance has denied return to SNF. Family appeal currently in progress. TOC team and Dr. [name] assisting.TOC team has faxed over a large volume/"almost 160 pages" of paperwork.  -4/2 insurance has reversed their decision on appeal patient cleared for SNF.  Hypokalemia -Potassium goal> 4 -4/3 potassium IV 60 mEq prior to discharge  Hypomagnesmia -Magnesium goal> 2  Metastatic malignant neoplasm  -Per EMR previous physician discussed case with Oncology, Dr. [name].  -Per oncology discontinue Palbociclib today prior to discharge. -Patient has follow-up appointment with Dr. [name] oncology on 14 April.  In addition per Dr. Virgie Dad note.  She will follow up with her at the SNF when he returns next week.  Anemia, chronic disease -Noted to be chronic and stable.  Lab Results  Component Value Date   HGB 8.2 (L) 05/31/2020   HGB 8.1 (L) 05/30/2020   HGB 7.9 (L) 05/29/2020   HGB 8.4 (L) 05/27/2020   HGB 8.3 (L) 05/25/2020  -Stable -Transfuse for hemoglobin<7  Essential HTN -Increase Cozaar 25 mg daily Cozaar 12.5 mg daily  Dysphagia -Cleared for regular diet with thin liquids per SLP -Continue to encourage PO as tolerated  Moderate protein calorie malnutrition -minimal PO intake recently -Pt reports not tolerating PO intake recently -Continue to encourage PO as tolerated   Discharge Diagnoses:  Principal Problem:   Thiamine deficiency Active Problems:    Metastatic malignant neoplasm (HCC)   Hypokalemia   Anemia, chronic disease   Essential hypertension   Discharge Condition: Stable  Diet recommendation: Regular with thin liquids  Filed Weights   05/20/20 1932 05/28/20 0956  Weight: 82.6 kg 88.2 kg    History of present illness:  70 y.o.WF PMHx HTN,metastatic breast cancer. She has been receiving chemotherapy and is followed by Dr. [name].   Patient has had progressive weakness was residing at a nursing facility. Patient had outpatient laboratory tests on March 13and results reportedand she was noted to haveundectablevitamin B1 levels. Patient's B12 level is also decreased. Patient was called with the result and instructed to come to the ED because she needs IV replacement therapy. Dr. [name] called the ED and stated the patient needs 500 mg of IV thiamine 3 times daily for the next 3 days.She was also reportedly seen by neurology recently for intermittent confusion and neurology recommended the IV thiamine replacement protocol  Hospital Course:  See above   Consultations: Dr. [name] oncology  Cultures   4/2 SARS coronavirus negative  Antibiotics Anti-infectives (From admission, onward)   None       Discharge Exam: Vitals:   05/29/20 1304 05/29/20 2237 05/30/20 1310 05/31/20 0522  BP: (!) 150/65 (!) 142/60 (!) 142/61 (!) 143/58  Pulse: 93 92 86 87  Resp: 20  20 20   Temp: 98 F (36.7 C) 97.7 F (36.5 C) 98 F (36.7 C) 98 F (36.7 C)  TempSrc: Oral Oral Oral   SpO2: 96% 97% 98% 95%  Weight:      Height:        General: A/O x4 No acute respiratory distress Eyes: negative scleral hemorrhage, negative anisocoria,  negative icterus ENT: Negative Runny nose, negative gingival bleeding, Neck:  Negative scars, masses, torticollis, lymphadenopathy, JVD Lungs: Clear to auscultation bilaterally without wheezes or crackles Cardiovascular: Regular rate and rhythm without murmur gallop or rub normal S1  and S2 left total mastectomy   Discharge Instructions   Allergies as of 05/31/2020      Reactions   Neosporin [neomycin-bacitracin Zn-polymyx] Swelling, Rash, Other (See Comments)   Blisters   Bactrim [sulfamethoxazole-trimethoprim]    Made her feel very sick   Benzalkonium Chloride Other (See Comments)   Redness (Pt is unaware)  It works by killing microorganisms and inhibiting their future growth, and for this reason frequently appears as an ingredient in antibacterial hand wipes, antiseptic creams and anti-itch ointments.   Latex Rash      Medication List    STOP taking these medications   Decubi-Vite Caps   furosemide 20 MG tablet Commonly known as: LASIX   palbociclib 75 MG tablet Commonly known as: Ibrance   PRESCRIPTION MEDICATION     TAKE these medications   (feeding supplement) PROSource Plus liquid Take 30 mLs by mouth daily.   feeding supplement Liqd Take 237 mLs by mouth daily.   acetaminophen 325 MG tablet Commonly known as: TYLENOL Take 2 tablets (650 mg total) by mouth every 6 (six) hours as needed for mild pain (or Fever >/= 101).   anastrozole 1 MG tablet Commonly known as: ARIMIDEX Take 1 tablet (1 mg total) by mouth daily.   dronabinol 2.5 MG capsule Commonly known as: MARINOL Take 1 capsule (2.5 mg total) by mouth daily before lunch.   famotidine 20 MG tablet Commonly known as: PEPCID Take 20 mg by mouth daily.   hydrocortisone cream 1 % Apply topically 2 (two) times daily. What changed:   how much to take  additional instructions  Another medication with the same name was removed. Continue taking this medication, and follow the directions you see here.   losartan 25 MG tablet Commonly known as: COZAAR Take 1 tablet (25 mg total) by mouth daily. What changed: how much to take   metoCLOPramide 10 MG tablet Commonly known as: REGLAN Take 1 tablet (10 mg total) by mouth every 8 (eight) hours as needed for nausea.   multivitamin  Liqd Take 15 mLs by mouth daily.   ondansetron 4 MG disintegrating tablet Commonly known as: ZOFRAN-ODT Take 1 tablet (4 mg total) by mouth every 8 (eight) hours as needed for nausea or vomiting.   oxyCODONE-acetaminophen 5-325 MG tablet Commonly known as: PERCOCET/ROXICET Take 1-2 tablets by mouth every 4 (four) hours as needed for moderate pain.   pantoprazole 40 MG tablet Commonly known as: PROTONIX Take 40 mg by mouth daily.   potassium chloride 10 MEQ tablet Commonly known as: KLOR-CON Take 40 mEq by mouth daily.   prochlorperazine 10 MG tablet Commonly known as: COMPAZINE Take 1 tablet (10 mg total) by mouth every 6 (six) hours as needed (Nausea or vomiting).   pyridoxine 100 MG tablet Commonly known as: B-6 Take 1 tablet (100 mg total) by mouth daily.   tamsulosin 0.4 MG Caps capsule Commonly known as: FLOMAX Take 1 capsule (0.4 mg total) by mouth daily.   thiamine 100 MG tablet Take 1 tablet (100 mg total) by mouth daily.   vitamin B-12 1000 MCG tablet Commonly known as: CYANOCOBALAMIN Take 1 tablet (1,000 mcg total) by mouth daily.      Allergies  Allergen Reactions  . Neosporin [Neomycin-Bacitracin Zn-Polymyx] Swelling, Rash and  Other (See Comments)    Blisters  . Bactrim [Sulfamethoxazole-Trimethoprim]     Made her feel very sick  . Benzalkonium Chloride Other (See Comments)    Redness (Pt is unaware)  It works by killing microorganisms and inhibiting their future growth, and for this reason frequently appears as an ingredient in antibacterial hand wipes, antiseptic creams and anti-itch ointments.  . Latex Rash      The results of significant diagnostics from this hospitalization (including imaging, microbiology, ancillary and laboratory) are listed below for reference.    Significant Diagnostic Studies: DG Abd 1 View  Result Date: 05/02/2020 CLINICAL DATA:  The joint with nausea and vomiting. EXAM: ABDOMEN - 1 VIEW COMPARISON:  Radiograph  04/20/2020, but CT 03/05/2020 FINDINGS: No evidence of free air on this supine view. Slight increased air throughout not abnormally distended colon. Probable barium in the appendix. There is a 2.2 cm stone in the region of the right renal pelvis. Granulomatous calcifications of the liver and spleen. Cholecystectomy clips in the right upper quadrant. Known osseous metastatic disease on prior PET is not well visualized by radiograph. IMPRESSION: 1. Nonobstructive bowel gas pattern. Mild increased air throughout the colon without abnormal distension is nonspecific in the setting. 2. Right renal stone. Electronically Signed   By: Keith Rake M.D.   On: 05/02/2020 20:13   CT Head Wo Contrast  Result Date: 05/02/2020 CLINICAL DATA:  70 year old female with metastatic breast cancer. Evaluate for brain metastases. EXAM: CT HEAD WITHOUT CONTRAST TECHNIQUE: Contiguous axial images were obtained from the base of the skull through the vertex without intravenous contrast. COMPARISON:  03/26/2020 brain MR FINDINGS: Brain: Parasagittal mass along the mid falx (series 2: Image 22-23) does not appear significantly changed from 03/26/2020 MR. The LEFT cerebellar lesion identified on recent MR is difficult to visualize on this study. No new masses/metastatic disease is identified on this noncontrast study. There is no evidence of acute infarct, midline shift, hydrocephalus or hemorrhage. Vascular: No hyperdense vessel or unexpected calcification. Skull: The known skull lesions identified on recent MR are difficult to visualize on this study. Sinuses/Orbits: No acute abnormality Other: None IMPRESSION: 1. No acute abnormality. No new masses/metastatic disease on this noncontrast study. 2. No significant change in parafalcine mass/metastasis. 3. The LEFT cerebellar lesion and skull lesions identified on recent MR are difficult to visualize on this study. Electronically Signed   By: Margarette Canada M.D.   On: 05/02/2020 16:01   MR  BRAIN W WO CONTRAST  Result Date: 05/06/2020 CLINICAL DATA:  Staging of intracranial meningeal carcinomatosis. EXAM: MRI HEAD WITHOUT AND WITH CONTRAST TECHNIQUE: Multiplanar, multiecho pulse sequences of the brain and surrounding structures were obtained without and with intravenous contrast. CONTRAST:  7mL GADAVIST GADOBUTROL 1 MMOL/ML IV SOLN COMPARISON:  03/26/2020 FINDINGS: Brain: No acute infarct, mass effect or extra-axial collection. No acute or chronic hemorrhage. 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 The midline structures are normal. There is unchanged dural thickening, right greater than left. The thickest portion is at the anterior right convexity, measuring 6 mm, unchanged. There is a 7 mm extra-axial mass within the left posterior fossa that is unchanged (17:7. A multilobular inter hemispheric lesion measures 3.6 x 1.0 cm, unchanged. Vascular: Major flow voids are preserved. Skull and upper cervical spine: Generally low T1-weighted signal throughout the calvarium Sinuses/Orbits:No paranasal sinus fluid levels or advanced mucosal thickening. No mastoid or middle ear effusion. Normal orbits. Other: Unchanged appearance of numerous subcutaneous lesions. IMPRESSION: 1. Unchanged right-greater-than-left dural thickening, which is  greatest at the anterior right convexity. 2. Unchanged inter hemispheric extra-axial mass, likely metastatic disease. 3. Unchanged 7 mm extra-axial lesion of the left posterior fossa, which may be a meningioma or a focus of metastatic disease. Electronically Signed   By: Ulyses Jarred M.D.   On: 05/06/2020 22:44   MR CERVICAL SPINE W WO CONTRAST  Result Date: 05/04/2020 CLINICAL DATA:  Breast carcinoma. Difficulty swallowing and lower extremity weakness. EXAM: MRI TOTAL SPINE WITHOUT AND WITH CONTRAST TECHNIQUE: Multisequence MR imaging of the spine from the cervical spine to the sacrum was performed prior to and following IV contrast administration for evaluation of spinal  metastatic disease. CONTRAST:  37mL GADAVIST GADOBUTROL 1 MMOL/ML IV SOLN COMPARISON:  None. FINDINGS: MRI CERVICAL SPINE FINDINGS Alignment: Physiologic. Vertebrae: There is diffuse abnormal signal throughout the cervical vertebral column. Lesions involve the vertebral bodies and the spinous processes. There is no compression fracture. Cord: No abnormal enhancement in the spinal cord. No epidural enhancement. Posterior Fossa, vertebral arteries, paraspinal tissues: Incompletely visualized dural-based mass in the left posterior fossa, unchanged. Disc levels: Degenerative disc disease greatest at C5-6 and C6-7 without spinal canal stenosis. MRI THORACIC SPINE FINDINGS Alignment:  Physiologic. Vertebrae: Diffusely abnormal signal within the thoracic vertebral column with greatest lesion at T9. No compression fracture. Cord: There are 2 small foci of abnormal contrast enhancement along the left dorsal aspect of the distal spinal cord at the T12 level. No epidural enhancement. Paraspinal and other soft tissues: Negative Disc levels: No spinal canal stenosis MRI LUMBAR SPINE FINDINGS Segmentation:  Normal Alignment:  Normal Vertebrae: Diffusely abnormal bone marrow signal. No compression fracture. Conus medullaris: Extends to the L1 level and appears normal aside from the lesions at the T12 level described above. Paraspinal and other soft tissues: Negative Disc levels: No spinal canal stenosis. IMPRESSION: 1. Diffuse osseous metastatic disease without pathologic fracture. 2. 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 3. No spinal canal stenosis. Electronically Signed   By: Ulyses Jarred M.D.   On: 05/04/2020 22:58   MR THORACIC SPINE W WO CONTRAST  Result Date: 05/04/2020 CLINICAL DATA:  Breast carcinoma. Difficulty swallowing and lower extremity weakness. EXAM: MRI TOTAL SPINE WITHOUT AND WITH  CONTRAST TECHNIQUE: Multisequence MR imaging of the spine from the cervical spine to the sacrum was performed prior to and following IV contrast administration for evaluation of spinal metastatic disease. CONTRAST:  69mL GADAVIST GADOBUTROL 1 MMOL/ML IV SOLN COMPARISON:  None. FINDINGS: MRI CERVICAL SPINE FINDINGS Alignment: Physiologic. Vertebrae: There is diffuse abnormal signal throughout the cervical vertebral column. Lesions involve the vertebral bodies and the spinous processes. There is no compression fracture. Cord: No abnormal enhancement in the spinal cord. No epidural enhancement. Posterior Fossa, vertebral arteries, paraspinal tissues: Incompletely visualized dural-based mass in the left posterior fossa, unchanged. Disc levels: Degenerative disc disease greatest at C5-6 and C6-7 without spinal canal stenosis. MRI THORACIC SPINE FINDINGS Alignment:  Physiologic. Vertebrae: Diffusely abnormal signal within the thoracic vertebral column with greatest lesion at T9. No compression fracture. Cord: There are 2 small foci of abnormal contrast enhancement along the left dorsal aspect of the distal spinal cord at the T12 level. No epidural enhancement. Paraspinal and other soft tissues: Negative Disc levels: No spinal canal stenosis MRI LUMBAR SPINE FINDINGS Segmentation:  Normal Alignment:  Normal Vertebrae: Diffusely abnormal bone marrow signal. No compression fracture. Conus medullaris: Extends to the L1  level and appears normal aside from the lesions at the T12 level described above. Paraspinal and other soft tissues: Negative Disc levels: No spinal canal stenosis. IMPRESSION: 1. Diffuse osseous metastatic disease without pathologic fracture. 2. 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 3. No spinal canal stenosis. Electronically Signed   By: Ulyses Jarred M.D.   On: 05/04/2020 22:58   MR  Lumbar Spine W Wo Contrast  Result Date: 05/04/2020 CLINICAL DATA:  Breast carcinoma. Difficulty swallowing and lower extremity weakness. EXAM: MRI TOTAL SPINE WITHOUT AND WITH CONTRAST TECHNIQUE: Multisequence MR imaging of the spine from the cervical spine to the sacrum was performed prior to and following IV contrast administration for evaluation of spinal metastatic disease. CONTRAST:  12mL GADAVIST GADOBUTROL 1 MMOL/ML IV SOLN COMPARISON:  None. FINDINGS: MRI CERVICAL SPINE FINDINGS Alignment: Physiologic. Vertebrae: There is diffuse abnormal signal throughout the cervical vertebral column. Lesions involve the vertebral bodies and the spinous processes. There is no compression fracture. Cord: No abnormal enhancement in the spinal cord. No epidural enhancement. Posterior Fossa, vertebral arteries, paraspinal tissues: Incompletely visualized dural-based mass in the left posterior fossa, unchanged. Disc levels: Degenerative disc disease greatest at C5-6 and C6-7 without spinal canal stenosis. MRI THORACIC SPINE FINDINGS Alignment:  Physiologic. Vertebrae: Diffusely abnormal signal within the thoracic vertebral column with greatest lesion at T9. No compression fracture. Cord: There are 2 small foci of abnormal contrast enhancement along the left dorsal aspect of the distal spinal cord at the T12 level. No epidural enhancement. Paraspinal and other soft tissues: Negative Disc levels: No spinal canal stenosis MRI LUMBAR SPINE FINDINGS Segmentation:  Normal Alignment:  Normal Vertebrae: Diffusely abnormal bone marrow signal. No compression fracture. Conus medullaris: Extends to the L1 level and appears normal aside from the lesions at the T12 level described above. Paraspinal and other soft tissues: Negative Disc levels: No spinal canal stenosis. IMPRESSION: 1. Diffuse osseous metastatic disease without pathologic fracture. 2. Two small foci of abnormal contrast enhancement along the left dorsal aspect of the distal  spinal cord at the T12 level, likely CSF disseminated metastatic disease. 3. No spinal canal stenosis. Electronically Signed   By: Ulyses Jarred M.D.   On: 05/04/2020 22:58   DG Chest Port 1 View  Result Date: 05/02/2020 CLINICAL DATA:  Weakness.  Nausea. EXAM: PORTABLE CHEST 1 VIEW COMPARISON:  Chest CT 02/27/2020 FINDINGS: Tip of the right upper extremity PICC in the SVC. Heart is borderline enlarged. Mild hilar prominence which may be due to adenopathy. There also calcified mediastinal and left hilar nodes. Many of the pulmonary nodules on prior CT are not seen. There is a calcified granuloma at the left lung base. No confluent consolidation. No pleural fluid. No pneumothorax. Postsurgical change of the left axilla and chest wall. Known osseous metastatic disease is not well detected by radiograph. IMPRESSION: 1. No acute findings. 2. Bilateral hilar prominence may be due to adenopathy in this patient with known metastatic breast cancer. 3. Right upper extremity PICC tip in the SVC. Electronically Signed   By: Keith Rake M.D.   On: 05/02/2020 15:08   ECHOCARDIOGRAM COMPLETE  Result Date: 05/07/2020    ECHOCARDIOGRAM REPORT   Patient Name:   DARCHELLE NUNES Date of Exam: 05/07/2020 Medical Rec #:  [id number]      Height:       69.0 in Accession #:    [id number]  Weight:       182.0 lb Date of Birth:  [birth year]     BSA:          1.985 m Patient Age:    71 years       BP:           107/65 mmHg Patient Gender: F              HR:           103 bpm. Exam Location:  Inpatient Procedure: 2D Echo, Cardiac Doppler and Color Doppler Indications:    CHF  History:        Patient has no prior history of Echocardiogram examinations.                 Risk Factors:Hypertension and Dyslipidemia. Breast cancer,                 chemo, radiation.  Sonographer:    Dustin Flock Referring Phys: [id number] Ellerslie [place] IMPRESSIONS  1. Left ventricular ejection fraction, by estimation, is 60 to 65%. The left ventricle has  normal function. The left ventricle has no regional wall motion abnormalities. Left ventricular diastolic parameters were normal.  2. Right ventricular systolic function is normal. The right ventricular size is normal. There is mildly elevated pulmonary artery systolic pressure.  3. The mitral valve is normal in structure. Trivial mitral valve regurgitation. No evidence of mitral stenosis.  4. The aortic valve is tricuspid. Aortic valve regurgitation is not visualized. No aortic stenosis is present.  5. The inferior vena cava is normal in size with greater than 50% respiratory variability, suggesting right atrial pressure of 3 mmHg. Comparison(s): No prior Echocardiogram. Conclusion(s)/Recommendation(s): Normal biventricular function without evidence of hemodynamically significant valvular heart disease. FINDINGS  Left Ventricle: Left ventricular ejection fraction, by estimation, is 60 to 65%. The left ventricle has normal function. The left ventricle has no regional wall motion abnormalities. The left ventricular internal cavity size was normal in size. There is  no left ventricular hypertrophy. Left ventricular diastolic parameters were normal. Right Ventricle: The right ventricular size is normal. No increase in right ventricular wall thickness. Right ventricular systolic function is normal. There is mildly elevated pulmonary artery systolic pressure. The tricuspid regurgitant velocity is 2.97  m/s, and with an assumed right atrial pressure of 3 mmHg, the estimated right ventricular systolic pressure is 37.9 mmHg. Left Atrium: Left atrial size was normal in size. Right Atrium: Right atrial size was normal in size. Pericardium: There is no evidence of pericardial effusion. Mitral Valve: The mitral valve is normal in structure. Trivial mitral valve regurgitation. No evidence of mitral valve stenosis. Tricuspid Valve: The tricuspid valve is normal in structure. Tricuspid valve regurgitation is trivial. No evidence  of tricuspid stenosis. Aortic Valve: The aortic valve is tricuspid. Aortic valve regurgitation is not visualized. No aortic stenosis is present. Aortic valve peak gradient measures 10.8 mmHg. Pulmonic Valve: The pulmonic valve was not well visualized. Pulmonic valve regurgitation is not visualized. No evidence of pulmonic stenosis. Aorta: The aortic root, ascending aorta, aortic arch and descending aorta are all structurally normal, with no evidence of dilitation or obstruction. Venous: The inferior vena cava is normal in size with greater than 50% respiratory variability, suggesting right atrial pressure of 3 mmHg. IAS/Shunts: The atrial septum is grossly normal.  LEFT VENTRICLE PLAX 2D LVIDd:         4.80 cm  Diastology LVIDs:         2.60  cm  LV e' medial:    7.83 cm/s LV PW:         1.00 cm  LV E/e' medial:  10.0 LV IVS:        1.10 cm  LV e' lateral:   14.30 cm/s LVOT diam:     2.00 cm  LV E/e' lateral: 5.5 LV SV:         79 LV SV Index:   40 LVOT Area:     3.14 cm  RIGHT VENTRICLE RV Basal diam:  3.20 cm RV S prime:     12.30 cm/s TAPSE (M-mode): 1.9 cm LEFT ATRIUM             Index       RIGHT ATRIUM           Index LA diam:        2.70 cm 1.36 cm/m  RA Area:     12.60 cm LA Vol (A2C):   30.2 ml 15.22 ml/m RA Volume:   28.10 ml  14.16 ml/m LA Vol (A4C):   37.1 ml 18.69 ml/m LA Biplane Vol: 33.8 ml 17.03 ml/m  AORTIC VALVE AV Area (Vmax): 2.45 cm AV Vmax:        164.00 cm/s AV Peak Grad:   10.8 mmHg LVOT Vmax:      128.00 cm/s LVOT Vmean:     77.000 cm/s LVOT VTI:       0.253 m  AORTA Ao Root diam: 3.00 cm MITRAL VALVE               TRICUSPID VALVE MV Area (PHT): 5.38 cm    TR Peak grad:   35.3 mmHg MV Decel Time: 141 msec    TR Vmax:        297.00 cm/s MV E velocity: 78.20 cm/s MV A velocity: 72.20 cm/s  SHUNTS MV E/A ratio:  1.08        Systemic VTI:  0.25 m                            Systemic Diam: 2.00 cm Buford Dresser MD Electronically signed by Buford Dresser MD Signature  Date/Time: 05/07/2020/3:47:58 PM    Final     Microbiology: Recent Results (from the past 240 hour(s))  SARS CORONAVIRUS 2 (TAT 6-24 HRS) Nasopharyngeal Nasopharyngeal Swab     Status: None   Collection Time: 05/23/20  1:24 PM   Specimen: Nasopharyngeal Swab  Result Value Ref Range Status   SARS Coronavirus 2 NEGATIVE NEGATIVE Final    Comment: (NOTE) SARS-CoV-2 target nucleic acids are NOT DETECTED.  The SARS-CoV-2 RNA is generally detectable in upper and lower respiratory specimens during the acute phase of infection. Negative results do not preclude SARS-CoV-2 infection, do not rule out co-infections with other pathogens, and should not be used as the sole basis for treatment or other patient management decisions. Negative results must be combined with clinical observations, patient history, and epidemiological information. The expected result is Negative.  Fact Sheet for Patients: [employer]  Fact Sheet for Healthcare Providers: https://www.-mathews.com/  This test is not yet approved or cleared by the [place] FDA and  has been authorized for detection and/or diagnosis of SARS-CoV-2 by FDA under an Emergency Use Authorization (EUA). This EUA will remain  in effect (meaning this test can be used) for the duration of the COVID-19 declaration under Se ction 564(b)(1) of the Act, 21 U.S.C. section 360bbb-3(b)(1), unless  the authorization is terminated or revoked sooner.  Performed at [facility], [place] [address], [place], [place] [address]   SARS CORONAVIRUS 2 (TAT 6-24 HRS) Nasopharyngeal Nasopharyngeal Swab     Status: None   Collection Time: 05/30/20  9:38 AM   Specimen: Nasopharyngeal Swab  Result Value Ref Range Status   SARS Coronavirus 2 NEGATIVE NEGATIVE Final    Comment: (NOTE) SARS-CoV-2 target nucleic acids are NOT DETECTED.  The SARS-CoV-2 RNA is generally detectable in upper and lower respiratory  specimens during the acute phase of infection. Negative results do not preclude SARS-CoV-2 infection, do not rule out co-infections with other pathogens, and should not be used as the sole basis for treatment or other patient management decisions. Negative results must be combined with clinical observations, patient history, and epidemiological information. The expected result is Negative.  Fact Sheet for Patients: SugarRoll.be  Fact Sheet for Healthcare Providers: https://www.[place]-mathews.com/  This test is not yet approved or cleared by the [place] FDA and  has been authorized for detection and/or diagnosis of SARS-CoV-2 by FDA under an Emergency Use Authorization (EUA). This EUA will remain  in effect (meaning this test can be used) for the duration of the COVID-19 declaration under Se ction 564(b)(1) of the Act, 21 U.S.C. section 360bbb-3(b)(1), unless the authorization is terminated or revoked sooner.  Performed at [facility], [place] [address], [place], [place] [address]      Labs: Basic Metabolic Panel: Recent Labs  Lab 05/25/20 0305 05/26/20 0422 05/27/20 0339 05/29/20 0303 05/30/20 0429 05/31/20 0415  NA 137 140 140 140 141 137  K 3.6 3.4* 3.5 3.1* 2.9* 3.4*  CL 107 107 109 108 108 105  CO2 22 21* 19* 22 23 22   GLUCOSE 82 81 79 84 86 85  BUN 6* <5* <5* 5* <5* 6*  CREATININE 0.36* 0.47 0.40* 0.38* 0.42* 0.50  CALCIUM 8.0* 8.2* 8.3* 8.6* 8.6* 8.3*  MG 1.7  --  1.7  --  1.6* 2.1  PHOS  --   --   --   --  3.4 3.3   Liver Function Tests: Recent Labs  Lab 05/25/20 0305 05/26/20 0422 05/27/20 0339 05/30/20 0429 05/31/20 0415  AST 50* 54* 59* 55* 56*  ALT 16 18 18 19 18   ALKPHOS 174* 176* 183* 154* 177*  BILITOT 2.0* 2.1* 2.6* 2.1* 1.7*  PROT 3.9* 4.1* 4.0* 3.8* 3.8*  ALBUMIN 2.1* 2.2* 2.2* 1.9* 2.0*   No results for input(s): LIPASE, AMYLASE in the last 168 hours. No results for input(s): AMMONIA in  the last 168 hours. CBC: Recent Labs  Lab 05/25/20 0305 05/27/20 0339 05/29/20 0303 05/30/20 0429 05/31/20 0415  WBC 4.3 4.4 3.8* 3.5* 3.5*  NEUTROABS  --   --   --  2.4 2.5  HGB 8.3* 8.4* 7.9* 8.1* 8.2*  HCT 26.9* 26.8* 25.2* 26.1* 26.0*  MCV 100.7* 101.9* 102.9* 103.2* 104.4*  PLT 188 186 146* 135* 131*   Cardiac Enzymes: No results for input(s): CKTOTAL, CKMB, CKMBINDEX, TROPONINI in the last 168 hours. BNP: BNP (last 3 results) Recent Labs    05/07/20 0529  BNP 90.7    ProBNP (last 3 results) No results for input(s): PROBNP in the last 8760 hours.  CBG: No results for input(s): GLUCAP in the last 168 hours.     Signed:  Dia Crawford, MD [facility]

## 2020-05-31 NOTE — Progress Notes (Signed)
RN attempted to call [facility] to give report. Front desk sent me to voicemail for [address] hall. This RN will continue to call facility

## 2020-06-02 DIAGNOSIS — D519 Vitamin B12 deficiency anemia, unspecified: Secondary | ICD-10-CM | POA: Diagnosis not present

## 2020-06-02 DIAGNOSIS — E876 Hypokalemia: Secondary | ICD-10-CM | POA: Diagnosis not present

## 2020-06-02 DIAGNOSIS — E519 Thiamine deficiency, unspecified: Secondary | ICD-10-CM | POA: Diagnosis not present

## 2020-06-02 DIAGNOSIS — D649 Anemia, unspecified: Secondary | ICD-10-CM | POA: Diagnosis not present

## 2020-06-02 DIAGNOSIS — C50919 Malignant neoplasm of unspecified site of unspecified female breast: Secondary | ICD-10-CM | POA: Diagnosis not present

## 2020-06-02 DIAGNOSIS — F419 Anxiety disorder, unspecified: Secondary | ICD-10-CM | POA: Diagnosis not present

## 2020-06-02 DIAGNOSIS — I10 Essential (primary) hypertension: Secondary | ICD-10-CM | POA: Diagnosis not present

## 2020-06-02 DIAGNOSIS — L8990 Pressure ulcer of unspecified site, unspecified stage: Secondary | ICD-10-CM | POA: Diagnosis not present

## 2020-06-03 DIAGNOSIS — C50919 Malignant neoplasm of unspecified site of unspecified female breast: Secondary | ICD-10-CM | POA: Diagnosis not present

## 2020-06-03 DIAGNOSIS — R339 Retention of urine, unspecified: Secondary | ICD-10-CM | POA: Diagnosis not present

## 2020-06-03 DIAGNOSIS — E519 Thiamine deficiency, unspecified: Secondary | ICD-10-CM | POA: Diagnosis not present

## 2020-06-03 DIAGNOSIS — N390 Urinary tract infection, site not specified: Secondary | ICD-10-CM | POA: Diagnosis not present

## 2020-06-03 DIAGNOSIS — R1310 Dysphagia, unspecified: Secondary | ICD-10-CM | POA: Diagnosis not present

## 2020-06-03 DIAGNOSIS — D696 Thrombocytopenia, unspecified: Secondary | ICD-10-CM | POA: Diagnosis not present

## 2020-06-03 DIAGNOSIS — E46 Unspecified protein-calorie malnutrition: Secondary | ICD-10-CM | POA: Diagnosis not present

## 2020-06-03 DIAGNOSIS — C7951 Secondary malignant neoplasm of bone: Secondary | ICD-10-CM | POA: Diagnosis not present

## 2020-06-03 DIAGNOSIS — R600 Localized edema: Secondary | ICD-10-CM | POA: Diagnosis not present

## 2020-06-08 ENCOUNTER — Non-Acute Institutional Stay: Payer: Medicare PPO | Admitting: Nurse Practitioner

## 2020-06-08 ENCOUNTER — Other Ambulatory Visit: Payer: Self-pay

## 2020-06-08 DIAGNOSIS — Z515 Encounter for palliative care: Secondary | ICD-10-CM

## 2020-06-08 DIAGNOSIS — C799 Secondary malignant neoplasm of unspecified site: Secondary | ICD-10-CM

## 2020-06-08 NOTE — Progress Notes (Signed)
[facility] Pall[facility] Consult Note Telephone: [phone]  Fax: [phone]    Date of encounter: 06/08/20 PATIENT NAME: Brooke [name] [address] [place] [address]   [phone] (home)  DOB: 19-Jun-1950 MRN: [id number]   PRIMARY CARE PROVIDER:    [name], MD,  [place] [address] [phone]  REFERRING PROVIDER:   Dr. Wenda Low (facility provider)  RESPONSIBLE PARTY:    Contact Information    Name Relation Home Work [address]   [place]    [phone]   [place], [place] other [phone]     Dolphus Jenny Sister [phone]  [phone]   Phineas Semen [phone]        I met face to face with patient and family in facility.  Palliative Care was asked to follow this patient by consultation request of  Dr. Wenda Low to address advance care planning and complex medical decision making. This is a follow up visit.                                   ASSESSMENT AND PLAN / RECOMMENDATIONS:   Advance Care Planning/Goals of Care: Goals include to maximize quality of life and symptom management. Our advance care planning conversation included a discussion about:     The value and importance of advance care planning   Identification and preparation of a healthcare agent   Review and updating or creation of an  advance directive document .  Decision not to resuscitate or to de-escalate disease focused treatments due to poor prognosis Advance Care Planning: Goal of care: Patient verbalized that her goal at this time is to get her finances and affairs straightened out. She report she has an appointment with an attorney later today, to discuss her estate planning needs. Patient verbalize that she is afraid of what could happen to her related to her diagnosis. She verbalized awareness that her cancer has spread, she however expressed hopefulness for a  cure saying her oncologist has not told her otherwise. She verbalized been anxious when the word "prognosis" was brought up during the discussion. She verbalized that she would want to hear about her prognosis from her oncologist as she trust only his opinion. She verbalized desire to continue treatment as long as she is offered one.Validation provided. Patient made aware that Palliative care will continue provide her support and guidance. Patient has an up coming appointment with her Oncologist in three days. Questions and concerns were addressed. Patient was encouraged to call with questions and/or concerns, my business card was provided. Directives: Signed DNR and MOST form on file in the facility, copy on [facility] EMR.  I spent 48 minutes providing this consultation. More than 50% of the time in this consultation was spent in counseling and care coordination.  ----------------------------------------------------------------------   Symptom Management/Plan:  Anxiety related cancer diagnosis: Ongoing and worsening. Consider starting patient on Lorazepam 0.$RemoveBefore'25mg'[id number]$  to 0.$Rem'5mg'kDfj$  daily as needed for anxiety. Patient currently not on treatment for her cancer, she has up coming appointment in three days with her Oncologist Anemia in chronic diease: Hgb 8.2 on 05/31/2020. Patient denied chest pain, denied palpitation, denied increased SOB. No report of acute bleed. Continue to monitor for symptom of worsening condition. Edema: Lasix discontinued on discharge form last hospitalization. Recommend restarting Lasix to relief edema. Cr 0.50, GFR >60 on 09/30/2020. Visit  findings and recommendation discussed via phone with patient's oncologist Dr. [name]. He verbalized agreement with starting patient on low dose Lorazepam, and restarting patient on Lasix. He also verbalized that patient can potentially benefit from continuing therapy with [facility], saying patient's main problem is having no family support as patient  is unable to care for herself at home and has no family to assist in her care. He however said he would refer patient for Hospice care if that is what patient desires.   Follow up Palliative Care Visit: Palliative care will continue to follow for complex medical decision making, advance care planning, and clarification of goals of care. Return in about 4 weeks or prn.  PPS: 30%  HOSPICE ELIGIBILITY/DIAGNOSIS: TBD  Chief Complaint: Anxiety  HISTORY OF PRESENT ILLNESS:  Brooke [name] is a 70 y.o. year old female with multiple medical problems including metastatic breast cancer, anemia, and anxiety. Patient seen today for complain of worsening anxiety related to her cancer diagnosis, condition is persistent and severe, affecting her functioning, patient has not tried anything to relief her anxiety, report anxiety is associated with insomnia. Patient friend report patient calls her on phone all the time crying. Patient with metastatic breast cancer with mets to brain, lungs, liver, and bone. Patient was on Ibrance which was discontinued during recent hospitalization from 05/20/2020 to 05/31/2020 for low thiamine level. Patient has anemia of chronic disease, Hgb 8.2 on 05/31/2020. Palliative care was asked to help address advance care planning and goals of care. This is a follow up visit. History obtained from review of EMR, discussion with facility staff, and interview with patient and her friend..  I reviewed available labs, medications, imaging, studies and related documents from [facility].  Records reviewed and summarized above.   Reviewed Labs 05/31/2020 Hgb 8.2, Hct 26, WBC 3.5, Cr 0.5, GFR >60, Alb 2.0 Reviewed imaging 05/06/2020 Showed stable tumors  ROS Constitutional: denies fever, denies chills, endorsed fatigue EYES: denies acute vision changes ENMT: denies dysphagia Cardiovascular: denies chest pain, denies palpitation Pulmonary: denies cough, denies increased SOB Abdomen:  endorses poor appetite, denies constipation, endorses incontinence of bowel GU:  foley cath present MSK: endorses ROM limitations, no falls reported Skin: endorsed wound on buttocks Neurological: endorses weakness, denies uncontrolled pain, endorsed insomnia Psych: Endorsed anxiety Heme/lymph/immuno: denies bruises, abnormal bleeding  Physical Exam: General: chronically ill and frail appearing, lying in bed in NAD  EYES: anicteric sclera,  no discharge  ENMT: intact hearing, oral mucous membranes moist CV: S1S2, RRR, +3 BLE edema,  Pulmonary: LCTA, no increased work of breathing, no cough, on 2L via nasal canula Abdomen: intake <25%, normo-active BS + 4 quadrants, soft and non tender, no ascites GU: foley cath present and patent, urine dark- yellow and clear MSK: sarcopenia, non ambulatory Skin: warm and dry, pale, generalized edema Neuro:  generalized weakness,  Mild confusion Psych: anxious affect, A and O x 2 Hem/lymph/immuno: no widespread bruising   Thank you for the opportunity to participate in the care of Ms. Monie.  The palliative care team will continue to follow. Please call our office at [phone] if we can be of additional assistance.   Jari Favre, DNP, AGPCNP-B

## 2020-06-09 DIAGNOSIS — I10 Essential (primary) hypertension: Secondary | ICD-10-CM | POA: Diagnosis not present

## 2020-06-09 DIAGNOSIS — R600 Localized edema: Secondary | ICD-10-CM | POA: Diagnosis not present

## 2020-06-09 DIAGNOSIS — C50919 Malignant neoplasm of unspecified site of unspecified female breast: Secondary | ICD-10-CM | POA: Diagnosis not present

## 2020-06-09 DIAGNOSIS — F419 Anxiety disorder, unspecified: Secondary | ICD-10-CM | POA: Diagnosis not present

## 2020-06-09 DIAGNOSIS — R339 Retention of urine, unspecified: Secondary | ICD-10-CM | POA: Diagnosis not present

## 2020-06-09 DIAGNOSIS — M6281 Muscle weakness (generalized): Secondary | ICD-10-CM | POA: Diagnosis not present

## 2020-06-10 NOTE — Progress Notes (Signed)
Brooke [name]  [phone] [phone]    ID: Brooke [name] DOB: 1950-06-12  MR#: [id number]  MGQ#:[id number]  Patient Care Team: [name], MD as PCP - General (Family Medicine) Rolm Bookbinder, MD as Attending Physician (Dermatology) Nyasiah Moffet, Virgie Dad, MD as Consulting Physician (Oncology) Kyung Rudd, MD as Consulting Physician (Radiation Oncology) Jovita Kussmaul, MD as Consulting Physician (General Surgery) Irene Limbo, MD as Consulting Physician (Plastic Surgery) Delice Bison, Charlestine Massed, NP as Nurse Practitioner (Hematology and Oncology) [name], MD as Consulting Physician (Obstetrics and Gynecology) Laurey Morale, MD as Consulting Physician (Family Medicine) Wenda Low, MD as Consulting Physician (Internal Medicine) OTHER MD:   CHIEF COMPLAINT: Estrogen receptor positive breast cancer now widely metastatic  CURRENT TREATMENT: anastrozole; palbociclib   INTERVAL HISTORY: Brooke [name] returns today for follow-up and treatment of her stage IV breast cancer, which is weakly estrogen receptor positive.  She is currently residing at [facility].  Palliative care is also visiting her there.  Since her last visit, she was admitted to the hospital on 05/20/2020 with severe thiamine depletion requiring emergent IV repletion.  She started anastrozole on 05/07/2020 while in the hospital. She is tolerating this well with no side effects she is aware of.  She was started on palbociclib at her last visit on 05/15/2020.  This was discontinued at the time of her hospitalization and is ready to be resumed.  She tolerated it well with no side effects that she is aware of  She started zoledronate on 03/19/2020, to be repeated every 12 weeks.  However this caused her severe bone pain.  That treatment is currently on hold   REVIEW OF SYSTEMS: Brooke [name] was confused today.  She kept repeating "they put clothes on me".  She was aware that she was  given an envelope to give me and that she had to take them the low back.  She was not sure where she was yesterday.  She thought she might have been at the cancer center yesterday.  She was not sure that she was at the cancer center today.  She did know who I was.  She denied pain, unusual headaches, visual changes, nausea, vomiting, cough, or problems with bowel or bladder habits.  I could not elicit how much she is out of bed during the day or how far she is getting on her physical therapy   COVID 19 VACCINATION STATUS: fully vaccinated [employer]), with booster 12/28/2019    HISTORY OF CURRENT ILLNESS: From the original intake note:  Brooke [name] herself palpated a change in her left breast and brought this to medical attention.  On 01/18/2017 she underwent bilateral diagnostic mammography with tomography and bilateral breast ultrasonography at [facility].  The breast density was category B.  At the most recent exam was from December 2016.  In the right breast upper outer quadrant there was a 1 cm irregular mass associated with punctate calcification.  By ultrasound this was not well-defined, and it was biopsied on 01/26/2017 with tomographic guidance this showed a complex sclerosing lesion with the usual ductal hyperplasia.  The right axilla was sonographically  On the left mammography showed a 4 cm dense mass with indistinct margins in the upper inner quadrant.  Ultrasound defined a 3 cm irregular mass in the upper inner quadrant of the left breast which was palpable.  There was an abnormal lymph node in the left axilla.  Biopsy of the left mass and abnormal lymph node ([id number], on 01/26/2017) showed  both to be involved by invasive ductal carcinoma, with some lobular features but E-cadherin positive, estrogen and progesterone receptors both 100% positive, with strong staining intensity, with MIB-1 between 15-20%.  There was no HER-2 amplification, the signals ratio being 1.31/1.58 and the number per cell  2.30/2.05  The patient's subsequent history is as detailed below.   PAST MEDICAL HISTORY: Past Medical History:  Diagnosis Date  . Anemia   . Anxiety   . Arthritis   . ASCUS of cervix with negative high risk HPV 12/2018  . Bilateral dry eyes   . Breast cancer, left breast ([place])   . Family history of breast cancer 02/02/2017  . Family history of breast cancer   . Family history of colon cancer 02/02/2017  . Family history of colon cancer   . Family history of prostate cancer   . Family history of prostate cancer in father 02/02/2017  . Family history of uterine cancer   . Fibroid   . History of kidney stones   . Hyperlipidemia   . Hypertension   . Melanoma of upper arm ([place]) 2009   RIGHT ARM   . Obesity (BMI 30.0-34.9)   . Osteopenia 09/2017   T score -1.9 max 8% / 0.8% statistically significant decline at right and left hip stable at the spine  . PONV (postoperative nausea and vomiting)   . Squamous carcinoma     of the skin  . UTI (urinary tract infection)   . Varicose veins of both lower extremities   . Vitamin D deficiency 07/2009   LOW VITAMIN D 29  . Vitamin deficiency 07/2008   VITAMIN D LOW 23    PAST SURGICAL HISTORY: Past Surgical History:  Procedure Laterality Date  . ABDOMINAL SURGERY  1975   ovairan cyst   . APPENDECTOMY    . BREAST LUMPECTOMY WITH RADIOACTIVE SEED LOCALIZATION Right 09/25/2017   Procedure: RIGHT BREAST LUMPECTOMY WITH RADIOACTIVE SEED LOCALIZATION;  Surgeon: Jovita Kussmaul, MD;  Location: [place];  Service: General;  Laterality: Right;  . BREAST SURGERY     bio  . CHOLECYSTECTOMY    . DERMOID CYST  EXCISION    . DILATATION & CURETTAGE/HYSTEROSCOPY WITH MYOSURE N/A 04/23/2019   Procedure: DILATATION & CURETTAGE/HYSTEROSCOPY WITH MYOSURE;  Surgeon: [name], MD;  Location: [place];  Service: Gynecology;  Laterality: N/A;  request to follow at 9:30am in [facility] block requests 45 minutes  . DILATION AND  CURETTAGE OF UTERUS  1998  . GALLBLADDER SURGERY    . HYSTEROSCOPY    . insertion  port a cath  03/02/2016  . MASTECTOMY MODIFIED RADICAL Left 09/25/2017   Procedure: LEFT MODIFIED RADICAL MASTECTOMY;  Surgeon: Jovita Kussmaul, MD;  Location: [place];  Service: General;  Laterality: Left;  . PORTACATH PLACEMENT N/A 03/02/2017   Procedure: INSERTION PORT-A-CATH;  Surgeon: Jovita Kussmaul, MD;  Location: [place];  Service: General;  Laterality: N/A;  . SKIN BIOPSY    . SKIN CANCER EXCISION  2010,2011   2010-RIGHT ARM, 2011-LEFT LOWER LEG.-DR Rolm Bookbinder DERM. DR. Sarajane Jews IS HER DERM SURGEON.    FAMILY HISTORY Family History  Problem Relation Age of Onset  . Diabetes Father   . Hypertension Father   . Heart disease Father   . Prostate cancer Father 59  . Arthritis Father   . Breast cancer Sister 54       again at age 52, GT results unk  . Skin cancer Sister   .  Colon cancer Paternal Grandfather 36  . Hypertension Mother   . Heart failure Mother   . Uterine cancer Sister 53       stage IV  . Breast cancer Cousin        mat first cousin dx under 67  The patient's father was diagnosed with prostate cancer in his 68s and died at age 102 from complications of that disease.  The patient's mother died at age 40 with congestive heart failure.  The patient had no brothers, 3 sisters.  One sister had breast cancer at age 25 and again at age 68.  She has been genetically tested and was found to be BRCA not mutated in addition a paternal grandfather had colon cancer in his 52s.  There is no history of ovarian cancer in the family to the patient's knowledge.   GYNECOLOGIC HISTORY:  Patient's last menstrual period was 03/18/1997. Menarche age 32. The patient is GX P0.  She stopped having periods in 1999 and took hormone replacement approximately 5 years.   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  She is now retired.  She is single and lived alone with no pets. She is currently in a skilled nursing facility ([name]).   ADVANCED DIRECTIVES: Do not resuscitate order is in place.  The patient believes she has named her sister [name] as Special educational needs teacher of attorney [phone]).   HEALTH MAINTENANCE: Social History   Tobacco Use  . Smoking status: Never Smoker  . Smokeless tobacco: Never Used  Vaping Use  . Vaping Use: Never used  Substance Use Topics  . Alcohol use: No  . Drug use: No    Colonoscopy: Never  PAP: 12/2019, negative  Bone density: 2017/osteopenia   Allergies  Allergen Reactions  . Neosporin [Neomycin-Bacitracin Zn-Polymyx] Swelling, Rash and Other (See Comments)    Blisters  . Bactrim [Sulfamethoxazole-Trimethoprim]     Made her feel very sick  . Benzalkonium Chloride Other (See Comments)    Redness (Pt is unaware)  It works by killing microorganisms and inhibiting their future growth, and for this reason frequently appears as an ingredient in antibacterial hand wipes, antiseptic creams and anti-itch ointments.  . Latex Rash    Current Outpatient Medications  Medication Sig Dispense Refill  . acetaminophen (TYLENOL) 325 MG tablet Take 2 tablets (650 mg total) by mouth every 6 (six) hours as needed for mild pain (or Fever >/= 101). 30 tablet 0  . anastrozole (ARIMIDEX) 1 MG tablet Take 1 tablet (1 mg total) by mouth daily.    Marland Kitchen dronabinol (MARINOL) 2.5 MG capsule Take 1 capsule (2.5 mg total) by mouth daily before lunch.    . famotidine (PEPCID) 20 MG tablet Take 20 mg by mouth daily.    . feeding supplement (ENSURE ENLIVE / ENSURE PLUS) LIQD Take 237 mLs by mouth daily. 237 mL 0  . hydrocortisone cream 1 % Apply topically 2 (two) times daily. (Patient taking differently: Apply 1 application topically 2 (two) times daily. To the abdomen area) 30 g 0  . losartan (COZAAR) 25 MG tablet Take 1 tablet (25 mg total) by mouth daily. 30 tablet 0   . metoCLOPramide (REGLAN) 10 MG tablet Take 1 tablet (10 mg total) by mouth every 8 (eight) hours as needed for nausea. (Patient not taking: No sig reported) 30 tablet 0  . Multiple Vitamin (MULTIVITAMIN) LIQD Take 15 mLs by mouth daily.  870 mL 0  . Nutritional Supplements (,FEEDING SUPPLEMENT, PROSOURCE PLUS) liquid Take 30 mLs by mouth daily. 30 mL 0  . ondansetron (ZOFRAN-ODT) 4 MG disintegrating tablet Take 1 tablet (4 mg total) by mouth every 8 (eight) hours as needed for nausea or vomiting. 20 tablet 0  . oxyCODONE-acetaminophen (PERCOCET/ROXICET) 5-325 MG tablet Take 1-2 tablets by mouth every 4 (four) hours as needed for moderate pain. 30 tablet 0  . palbociclib (IBRANCE) 75 MG tablet TAKE 1 TABLET (75 MG TOTAL) BY MOUTH DAILY. TAKE FOR 21 DAYS ON, 7 DAYS OFF, REPEAT EVERY 28 DAYS. 21 tablet 6  . pantoprazole (PROTONIX) 40 MG tablet Take 40 mg by mouth daily.    . potassium chloride (KLOR-CON) 10 MEQ tablet Take 40 mEq by mouth daily.    . prochlorperazine (COMPAZINE) 10 MG tablet Take 1 tablet (10 mg total) by mouth every 6 (six) hours as needed (Nausea or vomiting). 30 tablet 1  . pyridOXINE (B-6) 100 MG tablet Take 1 tablet (100 mg total) by mouth daily. 30 tablet 0  . tamsulosin (FLOMAX) 0.4 MG CAPS capsule Take 1 capsule (0.4 mg total) by mouth daily. 30 capsule   . thiamine 100 MG tablet Take 1 tablet (100 mg total) by mouth daily. 30 tablet 0  . vitamin B-12 (CYANOCOBALAMIN) 1000 MCG tablet Take 1 tablet (1,000 mcg total) by mouth daily.     No current facility-administered medications for this visit.     OBJECTIVE:  white woman examined in a wheelchair There were no vitals filed for this visit.   There is no height or weight on file to calculate BMI.   Wt Readings from Last 3 Encounters:  05/28/20 194 lb 7.1 oz (88.2 kg)  05/02/20 182 lb (82.6 kg)  04/30/20 185 lb 3.2 oz (84 kg)  ECOG FS:1 - Symptomatic but completely ambulatory   Sclerae unicteric, EOMs intact Lungs no  rales or rhonchi Heart regular rate and rhythm Abd soft, obese, nontender, positive bowel sounds MSK no focal spinal tenderness, bilateral lower extremity lymphedema, which appears improved Neuro: nonfocal, confused, anxious affect Breasts: The right breast is benign.  The left breast is status postmastectomy with no obvious chest wall recurrence.  Both axillae are benign. Skin: A subcutaneous cancer lesion at the nape of the neck and a second 1 above the right eyebrow are both slightly smaller than previously   LAB RESULTS:  CMP     Component Value Date/Time   NA 139 06/11/2020 1547   NA 141 03/03/2017 1358   K 3.1 (L) 06/11/2020 1547   K 3.3 (L) 03/03/2017 1358   CL 97 (L) 06/11/2020 1547   CO2 24 06/11/2020 1547   CO2 28 03/03/2017 1358   GLUCOSE 88 06/11/2020 1547   GLUCOSE 107 03/03/2017 1358   BUN 6 (L) 06/11/2020 1547   BUN 12.7 03/03/2017 1358   CREATININE 0.52 06/11/2020 1547   CREATININE 0.83 02/19/2020 0936   CREATININE 0.9 03/03/2017 1358   CALCIUM 8.4 (L) 06/11/2020 1547   CALCIUM 9.4 03/03/2017 1358   PROT 4.4 (L) 06/11/2020 1547   PROT 6.5 03/03/2017 1358   ALBUMIN 2.4 (L) 06/11/2020 1547   ALBUMIN 3.7 03/03/2017 1358   AST 78 (H) 06/11/2020 1547   AST 43 (H) 09/12/2018 1354   AST 23 03/03/2017 1358   ALT 17 06/11/2020 1547   ALT 84 (H) 09/12/2018 1354   ALT 25 03/03/2017 1358   ALKPHOS 195 (H) 06/11/2020 1547   ALKPHOS 71 03/03/2017  1358   BILITOT 1.4 (H) 06/11/2020 1547   BILITOT 0.6 09/12/2018 1354   BILITOT 0.52 03/03/2017 1358   GFRNONAA >60 06/11/2020 1547   GFRNONAA >60 09/12/2018 1354   GFRAA >60 06/12/2019 1401   GFRAA >60 09/12/2018 1354    No results found for: TOTALPROTELP, ALBUMINELP, A1GS, A2GS, BETS, BETA2SER, GAMS, MSPIKE, SPEI  No results found for: KPAFRELGTCHN, LAMBDASER, KAPLAMBRATIO  Lab Results  Component Value Date   WBC 4.0 06/11/2020   NEUTROABS 2.8 06/11/2020   HGB 9.8 (L) 06/11/2020   HCT 29.6 (L) 06/11/2020   MCV  103.9 (H) 06/11/2020   PLT 186 06/11/2020      Chemistry      Component Value Date/Time   NA 139 06/11/2020 1547   NA 141 03/03/2017 1358   K 3.1 (L) 06/11/2020 1547   K 3.3 (L) 03/03/2017 1358   CL 97 (L) 06/11/2020 1547   CO2 24 06/11/2020 1547   CO2 28 03/03/2017 1358   BUN 6 (L) 06/11/2020 1547   BUN 12.7 03/03/2017 1358   CREATININE 0.52 06/11/2020 1547   CREATININE 0.83 02/19/2020 0936   CREATININE 0.9 03/03/2017 1358      Component Value Date/Time   CALCIUM 8.4 (L) 06/11/2020 1547   CALCIUM 9.4 03/03/2017 1358   ALKPHOS 195 (H) 06/11/2020 1547   ALKPHOS 71 03/03/2017 1358   AST 78 (H) 06/11/2020 1547   AST 43 (H) 09/12/2018 1354   AST 23 03/03/2017 1358   ALT 17 06/11/2020 1547   ALT 84 (H) 09/12/2018 1354   ALT 25 03/03/2017 1358   BILITOT 1.4 (H) 06/11/2020 1547   BILITOT 0.6 09/12/2018 1354   BILITOT 0.52 03/03/2017 1358       No results found for: LABCA2  No components found for: [id number]  No results for input(s): INR in the last 168 hours.  No results found for: LABCA2  Lab Results  Component Value Date   WYO378 32 03/06/2020    No results found for: HYI502  No results found for: DXA128  Lab Results  Component Value Date   CA2729 1,504.7 (H) 05/08/2020    No components found for: HGQUANT  Lab Results  Component Value Date   CEA1 570.0 (H) 05/08/2020   /  CEA  Date Value Ref Range Status  05/08/2020 570.0 (H) 0.0 - 4.7 ng/mL Final    Comment:    (NOTE)                             Nonsmokers          <3.9                             Smokers             <5.6 [employer] Electrochemiluminescence Immunoassay (ECLIA) Values obtained with different assay methods or kits cannot be used interchangeably.  Results cannot be interpreted as absolute evidence of the presence or absence of malignant disease. Performed At: [facility] [place], [place] [id number] Rush Farmer MD OB:[id number]      No  results found for: AFPTUMOR  No results found for: [place]  No results found for: HGBA, HGBA2QUANT, HGBFQUANT, HGBSQUAN (Hemoglobinopathy evaluation)   No results found for: LDH  Lab Results  Component Value Date   IRON 52 02/19/2020   TIBC 327 02/19/2020   IRONPCTSAT 16 02/19/2020   (  Iron and TIBC)  Lab Results  Component Value Date   FERRITIN 298 (H) 02/19/2020    Urinalysis    Component Value Date/Time   COLORURINE YELLOW 05/03/2020 0415   APPEARANCEUR HAZY (A) 05/03/2020 0415   LABSPEC 1.011 05/03/2020 0415   PHURINE 6.0 05/03/2020 0415   GLUCOSEU NEGATIVE 05/03/2020 0415   GLUCOSEU NEGATIVE 11/05/2012 1559   HGBUR LARGE (A) 05/03/2020 0415   BILIRUBINUR NEGATIVE 05/03/2020 0415   BILIRUBINUR neg 08/10/2018 1529   KETONESUR 20 (A) 05/03/2020 0415   PROTEINUR 30 (A) 05/03/2020 0415   UROBILINOGEN 0.2 08/10/2018 1529   UROBILINOGEN 0.2 11/05/2012 1559   NITRITE NEGATIVE 05/03/2020 0415   LEUKOCYTESUR LARGE (A) 05/03/2020 0415    STUDIES: No results found.   ELIGIBLE FOR AVAILABLE RESEARCH PROTOCOL: no   ASSESSMENT: 70 y.o. Brooke [name] woman status post bilateral biopsies 01/26/2017, showing  (1) in the right breast, a complex sclerosing lesion, status post lumpectomy 09/25/2017, with no malignancy identified.  (2) in the left breast, a cT2 pN1 invasive ductal carcinoma (with some lobular features but E-cadherin positive), grade 2, estrogen and progesterone receptor positive, HER-2 not amplified, with an MIB-1 of 15-20%  (a) breast MRI 02/04/2017 suggests a T3 N1 tumor  (3) started neoadjuvant cyclophosphamide/docetaxel 03/03/2017, discontinued after 1 cycle with very poor tolerance  (a) started cyclophosphamide/methotrexate/fluorouracil (CMF) 03/28/2017, repeated x7 cycles, last dose 08/01/2017  (4) status post left modified radical mastectomy 09/25/17 for a ypT5 ypN2, stage IIIA invasive ductal carcinoma, grade 2, again estrogen and progesterone receptor  positive, HER-2 not amplified  (5) adjuvant radiation completed 12/21/2017  (a) capecitabine sensitization tolerated only the initial week of radiation (b) Site/dose: The patient initially received a dose of 50.4 Gy in 28 fractions to the left chest wall and left supraclavicular region. This was delivered using a 3-D conformal, 4 field technique. The patient then received a boost to the mastectomy scar. This delivered an additional 10 Gy in 5 fractions using an en face electron field. The total dose was 60.4 Gy.  ( 6) tamoxifen started 03/09/2018, discontinued January 2022 with metastatic progression  (7) genetics testing 12/12/2017 through the Multi-Gene Panel offered by [facility] found no deleterious mutations in AIP, ALK, APC, ATM, AXIN2,BAP1,  BARD1, BLM, BMPR1A, BRCA1, BRCA2, BRIP1, CASR, CDC73, CDH1, CDK4, CDKN1B, CDKN1C, CDKN2A (p14ARF), CDKN2A (p16INK4a), CEBPA, CHEK2, CTNNA1, DICER1, DIS3L2, EGFR (c.2369C>T, p.Thr790Met variant only), EPCAM (Deletion/duplication testing only), FH, FLCN, GATA2, GPC3, GREM1 (Promoter region deletion/duplication testing only), HOXB13 (c.251G>A, p.Gly84Glu), HRAS, KIT, MAX, MEN1, MET, MITF (c.952G>A, p.Glu318Lys variant only), MLH1, MSH2, MSH3, MSH6, MUTYH, NBN, NF1, NF2, NTHL1, PALB2, PDGFRA, PHOX2B, PMS2, POLD1, POLE, POT1, PRKAR1A, PTCH1, PTEN, RAD50, RAD51C, RAD51D, RB1, RECQL4, RET, RUNX1, SDHAF2, SDHA (sequence changes only), SDHB, SDHC, SDHD, SMAD4, SMARCA4, SMARCB1, SMARCE1, STK11, SUFU, TERC, TERT, TMEM127, TP53, TSC1, TSC2, VHL, WRN and WT1.   (8) Foundation 1 testing shows stable microsatellite status and 1 mutation/ Mb, TP53 mutations x2  (a) PD-L1 dated July 2019 negative  METASTATIC DISEASE: January 2022 (9) CT angio of the chest 02/27/2020 shows multiple hilar nodes consistent with prior granulomatous infection, multiple small pulmonary nodules, multiple punctate granulomas throughout the liver and spleen, nodular hepatic contour and areas of mottling  possibly due to steatosis, 1.6 cm left adrenal nodule  (a) PET scan 03/05/2020 shows mildly to highly hypermetabolic adenopathy, the pulmonary nodules being mildly to not hypermetabolic, a large right hepatic liver lesion with an SUV of 13.2 and additional hypermetabolic right and left lobe foci, the adrenal  lesion being hypermetabolic with an SUV of 16.  There are also peritoneal nodules and innumerable foci of bony involvement.  Incidental note was made of small heterogeneous foci of increased uptake in the cerebellum.  The CT images did not show gross abnormality in the visualized brain  (b) on 03/09/2020 CA 27-29 was 983.0, CEA 744.17, the CA 19-9 normal at 32  (c) liver biopsy 03/13/2020 confirms metastati carcinoma, estrogen receptor positive, progesterone and HER 2 negative, with an Mib-1 of 40%  (d) brain MRI 03/26/2020 shows dural involvement including a 1.0 cm dural-based cerebellar lesion  (10) paclitaxel started 03/30/2020, to be repeated weekly as tolerated  (11) zoledronate started 03/19/2020, to be repeated every 12 weeks  (12)anastrozole started 05/07/2020             (a) palbociclib 125 mg daily, 21 days off, 7 days off, started 05/16/2020  (13) severe thiamine depletion requiring emergent IV repletion 05/20/2020             (a) continue oral thiamine replacement nindefinitely   PLAN: Samiah is very confused.  This could be due to medication, could be due to being transported to a completely different area today, could be due to depression.  She certainly is very anxious and I think she might respond to venlafaxine 75 mg.  I went ahead and wrote that on my order sheet.  To the best of my ability to tell she is tolerating the anastrozole and the palbociclib without complications.  Today her CBC is adequate to resume the palbociclib and I have written for it to be restarted 06/16/2020, at 75 mg daily for 21 days, then off for 7 days.  The total bilirubin is decreasing, and the  albumin is increasing.  Those are encouraging.  The ALT remains normal, with slight upward trend in the AST and alkaline phosphatase.  The potassium was 3.1 and I suggested they may want a make sure she is taking those potassium supplements regularly as they are very difficult to take and she has problems with swallowing.  Otherwise the plan for now is to continue as before.  She will return to see me 07/06/2020 and we will reassess at that time.  Total encounter time 30 minutes.*    Antonya Leeder, Virgie Dad, MD  06/11/20 6:30 PM Medical Oncology and Hematology [facility] [place], [place] [address] Tel. [phone]    Fax. [phone]   I, Wilburn Mylar, am acting as scribe for Dr. Virgie Dad. Hymie Gorr.  I, Lurline Del MD, have reviewed the above documentation for accuracy and completeness, and I agree with the above.   *Total Encounter Time as defined by the Centers for Medicare and Medicaid Services includes, in addition to the face-to-face time of a patient visit (documented in the note above) non-face-to-face time: obtaining and reviewing outside history, ordering and reviewing medications, tests or procedures, care coordination (communications with other health care professionals or caregivers) and documentation in the medical record.

## 2020-06-11 ENCOUNTER — Inpatient Hospital Stay: Payer: Medicare PPO | Attending: Oncology

## 2020-06-11 ENCOUNTER — Inpatient Hospital Stay: Payer: Medicare PPO

## 2020-06-11 ENCOUNTER — Inpatient Hospital Stay (HOSPITAL_BASED_OUTPATIENT_CLINIC_OR_DEPARTMENT_OTHER): Payer: Medicare PPO | Admitting: Oncology

## 2020-06-11 ENCOUNTER — Other Ambulatory Visit: Payer: Self-pay

## 2020-06-11 VITALS — BP 121/67 | HR 94 | Temp 98.0°F | Resp 18

## 2020-06-11 DIAGNOSIS — Z170 Estrogen receptor positive status [ER+]: Secondary | ICD-10-CM | POA: Diagnosis not present

## 2020-06-11 DIAGNOSIS — C50812 Malignant neoplasm of overlapping sites of left female breast: Secondary | ICD-10-CM

## 2020-06-11 DIAGNOSIS — C7951 Secondary malignant neoplasm of bone: Secondary | ICD-10-CM | POA: Insufficient documentation

## 2020-06-11 DIAGNOSIS — Z9012 Acquired absence of left breast and nipple: Secondary | ICD-10-CM | POA: Insufficient documentation

## 2020-06-11 DIAGNOSIS — Z79811 Long term (current) use of aromatase inhibitors: Secondary | ICD-10-CM | POA: Insufficient documentation

## 2020-06-11 DIAGNOSIS — Z452 Encounter for adjustment and management of vascular access device: Secondary | ICD-10-CM | POA: Insufficient documentation

## 2020-06-11 DIAGNOSIS — Z79899 Other long term (current) drug therapy: Secondary | ICD-10-CM | POA: Insufficient documentation

## 2020-06-11 DIAGNOSIS — D0361 Melanoma in situ of right upper limb, including shoulder: Secondary | ICD-10-CM

## 2020-06-11 DIAGNOSIS — Z95828 Presence of other vascular implants and grafts: Secondary | ICD-10-CM

## 2020-06-11 DIAGNOSIS — C50912 Malignant neoplasm of unspecified site of left female breast: Secondary | ICD-10-CM | POA: Diagnosis not present

## 2020-06-11 LAB — CBC WITH DIFFERENTIAL/PLATELET
Abs Immature Granulocytes: 0.03 10*3/uL (ref 0.00–0.07)
Basophils Absolute: 0.1 10*3/uL (ref 0.0–0.1)
Basophils Relative: 1 %
Eosinophils Absolute: 0 10*3/uL (ref 0.0–0.5)
Eosinophils Relative: 1 %
HCT: 29.6 % — ABNORMAL LOW (ref 36.0–46.0)
Hemoglobin: 9.8 g/dL — ABNORMAL LOW (ref 12.0–15.0)
Immature Granulocytes: 1 %
Lymphocytes Relative: 16 %
Lymphs Abs: 0.7 10*3/uL (ref 0.7–4.0)
MCH: 34.4 pg — ABNORMAL HIGH (ref 26.0–34.0)
MCHC: 33.1 g/dL (ref 30.0–36.0)
MCV: 103.9 fL — ABNORMAL HIGH (ref 80.0–100.0)
Monocytes Absolute: 0.5 10*3/uL (ref 0.1–1.0)
Monocytes Relative: 12 %
Neutro Abs: 2.8 10*3/uL (ref 1.7–7.7)
Neutrophils Relative %: 69 %
Platelets: 186 10*3/uL (ref 150–400)
RBC: 2.85 MIL/uL — ABNORMAL LOW (ref 3.87–5.11)
RDW: 25.6 % — ABNORMAL HIGH (ref 11.5–15.5)
WBC: 4 10*3/uL (ref 4.0–10.5)
nRBC: 0.5 % — ABNORMAL HIGH (ref 0.0–0.2)

## 2020-06-11 LAB — COMPREHENSIVE METABOLIC PANEL
ALT: 17 U/L (ref 0–44)
AST: 78 U/L — ABNORMAL HIGH (ref 15–41)
Albumin: 2.4 g/dL — ABNORMAL LOW (ref 3.5–5.0)
Alkaline Phosphatase: 195 U/L — ABNORMAL HIGH (ref 38–126)
Anion gap: 18 — ABNORMAL HIGH (ref 5–15)
BUN: 6 mg/dL — ABNORMAL LOW (ref 8–23)
CO2: 24 mmol/L (ref 22–32)
Calcium: 8.4 mg/dL — ABNORMAL LOW (ref 8.9–10.3)
Chloride: 97 mmol/L — ABNORMAL LOW (ref 98–111)
Creatinine, Ser: 0.52 mg/dL (ref 0.44–1.00)
GFR, Estimated: >60 mL/min (ref >60)
Glucose, Bld: 88 mg/dL (ref 70–99)
Potassium: 3.1 mmol/L — ABNORMAL LOW (ref 3.5–5.1)
Sodium: 139 mmol/L (ref 135–145)
Total Bilirubin: 1.4 mg/dL — ABNORMAL HIGH (ref 0.3–1.2)
Total Protein: 4.4 g/dL — ABNORMAL LOW (ref 6.5–8.1)

## 2020-06-11 MED ORDER — VENLAFAXINE HCL ER 75 MG PO CP24: 75.0000 mg | ORAL_CAPSULE | Freq: Every day | ORAL | 4 refills | Status: AC

## 2020-06-11 MED ORDER — SODIUM CHLORIDE 0.9% FLUSH
3.0000 mL | Freq: Once | INTRAVENOUS | Status: AC
  Administered 2020-06-11: 3 mL via INTRAVENOUS
  Filled 2020-06-11: qty 10

## 2020-06-11 MED ORDER — CYANOCOBALAMIN 1000 MCG/ML IJ SOLN
INTRAMUSCULAR | Status: AC
  Filled 2020-06-11: qty 1

## 2020-06-11 MED ORDER — HEPARIN SOD (PORK) LOCK FLUSH 100 UNIT/ML IV SOLN
250.0000 [IU] | Freq: Once | INTRAVENOUS | Status: AC
  Administered 2020-06-11: 250 [IU] via INTRAVENOUS
  Filled 2020-06-11: qty 5

## 2020-06-11 MED ORDER — CYANOCOBALAMIN 1000 MCG/ML IJ SOLN
1000.0000 ug | Freq: Once | INTRAMUSCULAR | Status: AC
Start: 2020-06-11 — End: 2020-06-11
  Administered 2020-06-11: 1000 ug via INTRAMUSCULAR

## 2020-06-11 MED ORDER — SODIUM CHLORIDE 0.9% FLUSH
3.0000 mL | Freq: Once | INTRAVENOUS | Status: AC
Start: 2020-06-11 — End: 2020-06-11
  Administered 2020-06-11: 3 mL via INTRAVENOUS
  Filled 2020-06-11: qty 10

## 2020-06-11 NOTE — Patient Instructions (Signed)
Cyanocobalamin, Vitamin B12 injection What is this medicine? CYANOCOBALAMIN (sye an oh koe BAL a min) is a man made form of vitamin B12. Vitamin B12 is used in the growth of healthy blood cells, nerve cells, and proteins in the body. It also helps with the metabolism of fats and carbohydrates. This medicine is used to treat people who can not absorb vitamin B12. This medicine may be used for other purposes; ask your health care provider or pharmacist if you have questions. COMMON BRAND NAME(S): B-12 Compliance Kit, B-12 Injection Kit, Cyomin, LA-12, Nutri-Twelve, Physicians EZ Use B-12, Primabalt What should I tell my health care provider before I take this medicine? They need to know if you have any of these conditions:  kidney disease  Leber's disease  megaloblastic anemia  an unusual or allergic reaction to cyanocobalamin, cobalt, other medicines, foods, dyes, or preservatives  pregnant or trying to get pregnant  breast-feeding How should I use this medicine? This medicine is injected into a muscle or deeply under the skin. It is usually given by a health care professional in a clinic or doctor's office. However, your doctor may teach you how to inject yourself. Follow all instructions. Talk to your pediatrician regarding the use of this medicine in children. Special care may be needed. Overdosage: If you think you have taken too much of this medicine contact a poison control center or emergency room at once. NOTE: This medicine is only for you. Do not share this medicine with others. What if I miss a dose? If you are given your dose at a clinic or doctor's office, call to reschedule your appointment. If you give your own injections and you miss a dose, take it as soon as you can. If it is almost time for your next dose, take only that dose. Do not take double or extra doses. What may interact with this medicine?  colchicine  heavy alcohol intake This list may not describe all  possible interactions. Give your health care provider a list of all the medicines, herbs, non-prescription drugs, or dietary supplements you use. Also tell them if you smoke, drink alcohol, or use illegal drugs. Some items may interact with your medicine. What should I watch for while using this medicine? Visit your doctor or health care professional regularly. You may need blood work done while you are taking this medicine. You may need to follow a special diet. Talk to your doctor. Limit your alcohol intake and avoid smoking to get the best benefit. What side effects may I notice from receiving this medicine? Side effects that you should report to your doctor or health care professional as soon as possible:  allergic reactions like skin rash, itching or hives, swelling of the face, lips, or tongue  blue tint to skin  chest tightness, pain  difficulty breathing, wheezing  dizziness  red, swollen painful area on the leg Side effects that usually do not require medical attention (report to your doctor or health care professional if they continue or are bothersome):  diarrhea  headache This list may not describe all possible side effects. Call your doctor for medical advice about side effects. You may report side effects to FDA at 1-800-FDA-1088. Where should I keep my medicine? Keep out of the reach of children. Store at room temperature between 15 and 30 degrees C (59 and 85 degrees F). Protect from light. Throw away any unused medicine after the expiration date. NOTE: This sheet is a summary. It may not cover  all possible information. If you have questions about this medicine, talk to your doctor, pharmacist, or health care provider.  2021 Elsevier/Gold Standard (2007-05-28 22:10:20) Implanted [facility] Guide An implanted port is a device that is placed under the skin. It is usually placed in the chest. The device can be used to give IV medicine, to take blood, or for dialysis. You  may have an implanted port if:  You need IV medicine that would be irritating to the small veins in your hands or arms.  You need IV medicines, such as antibiotics, for a long period of time.  You need IV nutrition for a long period of time.  You need dialysis. When you have a port, your health care provider can choose to use the port instead of veins in your arms for these procedures. You may have fewer limitations when using a port than you would if you used other types of long-term IVs, and you will likely be able to return to normal activities after your incision heals. An implanted port has two main parts:  Reservoir. The reservoir is the part where a needle is inserted to give medicines or draw blood. The reservoir is round. After it is placed, it appears as a small, raised area under your skin.  Catheter. The catheter is a thin, flexible tube that connects the reservoir to a vein. Medicine that is inserted into the reservoir goes into the catheter and then into the vein. How is my port accessed? To access your port:  A numbing cream may be placed on the skin over the port site.  Your health care provider will put on a mask and sterile gloves.  The skin over your port will be cleaned carefully with a germ-killing soap and allowed to dry.  Your health care provider will gently pinch the port and insert a needle into it.  Your health care provider will check for a blood return to make sure the port is in the vein and is not clogged.  If your port needs to remain accessed to get medicine continuously (constant infusion), your health care provider will place a clear bandage (dressing) over the needle site. The dressing and needle will need to be changed every week, or as told by your health care provider. What is flushing? Flushing helps keep the port from getting clogged. Follow instructions from your health care provider about how and when to flush the port. Ports are usually  flushed with saline solution or a medicine called heparin. The need for flushing will depend on how the port is used:  If the port is only used from time to time to give medicines or draw blood, the port may need to be flushed: ? Before and after medicines have been given. ? Before and after blood has been drawn. ? As part of routine maintenance. Flushing may be recommended every 4-6 weeks.  If a constant infusion is running, the port may not need to be flushed.  Throw away any syringes in a disposal container that is meant for sharp items (sharps container). You can buy a sharps container from a pharmacy, or you can make one by using an empty hard plastic bottle with a cover. How long will my port stay implanted? The port can stay in for as long as your health care provider thinks it is needed. When it is time for the port to come out, a surgery will be done to remove it. The surgery will be similar  to the procedure that was done to put the port in. Follow these instructions at home:  Flush your port as told by your health care provider.  If you need an infusion over several days, follow instructions from your health care provider about how to take care of your port site. Make sure you: ? Wash your hands with soap and water before you change your dressing. If soap and water are not available, use alcohol-based hand sanitizer. ? Change your dressing as told by your health care provider. ? Place any used dressings or infusion bags into a plastic bag. Throw that bag in the trash. ? Keep the dressing that covers the needle clean and dry. Do not get it wet. ? Do not use scissors or sharp objects near the tube. ? Keep the tube clamped, unless it is being used.  Check your port site every day for signs of infection. Check for: ? Redness, swelling, or pain. ? Fluid or blood. ? Pus or a bad smell.  Protect the skin around the port site. ? Avoid wearing bra straps that rub or irritate the  site. ? Protect the skin around your port from seat belts. Place a soft pad over your chest if needed.  Bathe or shower as told by your health care provider. The site may get wet as long as you are not actively receiving an infusion.  Return to your normal activities as told by your health care provider. Ask your health care provider what activities are safe for you.  Carry a medical alert card or wear a medical alert bracelet at all times. This will let health care providers know that you have an implanted port in case of an emergency.   Get help right away if:  You have redness, swelling, or pain at the port site.  You have fluid or blood coming from your port site.  You have pus or a bad smell coming from the port site.  You have a fever. Summary  Implanted ports are usually placed in the chest for long-term IV access.  Follow instructions from your health care provider about flushing the port and changing bandages (dressings).  Take care of the area around your port by avoiding clothing that puts pressure on the area, and by watching for signs of infection.  Protect the skin around your port from seat belts. Place a soft pad over your chest if needed.  Get help right away if you have a fever or you have redness, swelling, pain, drainage, or a bad smell at the port site. This information is not intended to replace advice given to you by your health care provider. Make sure you discuss any questions you have with your health care provider. Document Revised: 07/01/2019 Document Reviewed: 07/01/2019 Elsevier Patient Education  2021 Elsevier Inc.  

## 2020-06-12 NOTE — Progress Notes (Signed)
Progress note from visit 4/14 successfully faxed to [facility].  [phone]

## 2020-06-15 ENCOUNTER — Non-Acute Institutional Stay: Payer: Medicare PPO | Admitting: Nurse Practitioner

## 2020-06-15 ENCOUNTER — Other Ambulatory Visit: Payer: Self-pay

## 2020-06-15 DIAGNOSIS — Z515 Encounter for palliative care: Secondary | ICD-10-CM | POA: Diagnosis not present

## 2020-06-15 DIAGNOSIS — R630 Anorexia: Secondary | ICD-10-CM

## 2020-06-15 NOTE — Progress Notes (Signed)
[facility] Consult Note Telephone: [phone]  Fax: [phone]    Date of encounter: 06/15/20 PATIENT NAME: Brooke [name] [address] [place] [address]   [phone] (home)  DOB: May 29, 1950 MRN: [id number] PRIMARY CARE PROVIDER:    [name], MD,  [place] [address] [phone]  REFERRING PROVIDER:   Dr. Denton Ar Husain(facility provider)  RESPONSIBLE PARTY:    Contact Information    Name Relation Home Work [address]   [place]    [phone]   [place], [place] other [phone]     Dolphus Jenny Sister [phone]  [phone]   Phineas Semen [phone]        I met face to face with patient and friend/POA Leda Gauze in facility. Palliative Care was asked to follow this patient by consultation request of  Dr. Erich Montane to address advance care planning and complex medical decision making. This is a follow up visit.                                   ASSESSMENT AND PLAN / RECOMMENDATIONS:   Advance Care Planning/Goals of Care:  Code Status: DNR Goal of care: Patient's goal of care is function. Directives: Patient's has a signed DNR and MOST forms present on file in facility, copy on [facility] EMR. Details of MOST form include limited additional intervention, antibiotics if indicated, IV fluids if indicated, no feeding tube.  Symptom Management/Plan: Poor oral intake: may consider scheduling antiemetic proactively to prevent nausea and promote good oral intake as patient may not consistently ask for med when needed. Hypokalemia: Hold Lasix, check stat BMET to evaluate potassium level as patient not consistently taking meds. Staff instructed to call result to Dr. Deforest Hoyles her facility provider.  Plan of care discussed with Dr. Rueben Bash via telephone, he verbalized agreement with plan. Palliative care will continue to provide support to  patient, family and the medical team.  Metastatic cancer: patient under care of Dr. [name], last office visit was on 06/11/2020. Continue to follow up as scheduled.  Follow up Palliative Care Visit: Palliative care will continue to follow for complex medical decision making, advance care planning, and clarification of goals. Return in 4 weeks or prn.  This visit was coded based on medical decision making (MDM).  PPS: 30%  HOSPICE ELIGIBILITY/DIAGNOSIS: Yes/ metastatic cancer. Patient however currently undergoing active treatment.  CHIEF COMPLAINT: Poor oral intake   History obtained from review of EMR, discussion with facility staff, and interview with POA Leda Gauze and Ms. Atwater.   HISTORY OF PRESENT ILLNESS:  AVANGELINA FLIGHT is a 70 y.o. year old female  with metastatic breast cancer, mets to brain, lungs, liver, bone. Staff report ongoing and worsening poor oral intake in the context of deconditioning secondary to metastatic cancer. Symptoms is associated with nausea with occasional vomiting. Staff report patient nibbles on her meals, often leaving it uneaten. Staff report patient sometimes refuses her medications especially the big pills. Patient noted with medications in a pill cup by her bedside. Patient on Potassium 19mq and Lasix 249m Staff report patient would often spit out the potassium pill when mixed in apple sauce. Patient restarted on Lasix 2018maily on 06/09/2020, Potasium level noted to be 3.1 on 06/11/2020. Patient with history of hypokalemia. Patient denied chest pain, denied fever, denied chills, denied SOB, denied acute bleed, endorsed intermittent  abdominal cramps, report nausea relieved with prn antiemetic.The ten systems reviewed and are negative for acute change, except as noted in the HPI. Of note, patient restated on Ibrance today for her metastatic cancer.   I reviewed available labs, medications, imaging, studies and related documents from the Epic EMR.  Records  reviewed and summarized as folllows.    Reviewed Labs 06/11/2020 Na 139, K 3.1, BUN 6, Albumin 2.4, Cr 0.52, GFR >60, WBC 4.0, Hgb 9.8, Hct 29.6 Reviewed imaging 05/06/2020 Showed stable tumors . Current Outpatient Medications on File Prior to Visit  Medication Sig Dispense Refill  . acetaminophen (TYLENOL) 325 MG tablet Take 2 tablets (650 mg total) by mouth every 6 (six) hours as needed for mild pain (or Fever >/= 101). 30 tablet 0  . anastrozole (ARIMIDEX) 1 MG tablet Take 1 tablet (1 mg total) by mouth daily.    Marland Kitchen dronabinol (MARINOL) 2.5 MG capsule Take 1 capsule (2.5 mg total) by mouth daily before lunch.    . famotidine (PEPCID) 20 MG tablet Take 20 mg by mouth daily.    . feeding supplement (ENSURE ENLIVE / ENSURE PLUS) LIQD Take 237 mLs by mouth daily. 237 mL 0  . hydrocortisone cream 1 % Apply topically 2 (two) times daily. (Patient taking differently: Apply 1 application topically 2 (two) times daily. To the abdomen area) 30 g 0  . losartan (COZAAR) 25 MG tablet Take 1 tablet (25 mg total) by mouth daily. 30 tablet 0  . metoCLOPramide (REGLAN) 10 MG tablet Take 1 tablet (10 mg total) by mouth every 8 (eight) hours as needed for nausea. (Patient not taking: No sig reported) 30 tablet 0  . Multiple Vitamin (MULTIVITAMIN) LIQD Take 15 mLs by mouth daily. 870 mL 0  . Nutritional Supplements (,FEEDING SUPPLEMENT, PROSOURCE PLUS) liquid Take 30 mLs by mouth daily. 30 mL 0  . ondansetron (ZOFRAN-ODT) 4 MG disintegrating tablet Take 1 tablet (4 mg total) by mouth every 8 (eight) hours as needed for nausea or vomiting. 20 tablet 0  . oxyCODONE-acetaminophen (PERCOCET/ROXICET) 5-325 MG tablet Take 1-2 tablets by mouth every 4 (four) hours as needed for moderate pain. 30 tablet 0  . palbociclib (IBRANCE) 75 MG tablet TAKE 1 TABLET (75 MG TOTAL) BY MOUTH DAILY. TAKE FOR 21 DAYS ON, 7 DAYS OFF, REPEAT EVERY 28 DAYS. 21 tablet 6  . pantoprazole (PROTONIX) 40 MG tablet Take 40 mg by mouth daily.     . potassium chloride (KLOR-CON) 10 MEQ tablet Take 40 mEq by mouth daily.    . prochlorperazine (COMPAZINE) 10 MG tablet Take 1 tablet (10 mg total) by mouth every 6 (six) hours as needed (Nausea or vomiting). 30 tablet 1  . pyridOXINE (B-6) 100 MG tablet Take 1 tablet (100 mg total) by mouth daily. 30 tablet 0  . tamsulosin (FLOMAX) 0.4 MG CAPS capsule Take 1 capsule (0.4 mg total) by mouth daily. 30 capsule   . thiamine 100 MG tablet Take 1 tablet (100 mg total) by mouth daily. 30 tablet 0  . venlafaxine XR (EFFEXOR-XR) 75 MG 24 hr capsule Take 1 capsule (75 mg total) by mouth daily with breakfast. 90 capsule 4  . vitamin B-12 (CYANOCOBALAMIN) 1000 MCG tablet Take 1 tablet (1,000 mcg total) by mouth daily.     No current facility-administered medications on file prior to visit.    Current Outpatient Medications on File Prior to Visit  Medication Sig Dispense Refill  . acetaminophen (TYLENOL) 325 MG tablet Take 2 tablets (650  mg total) by mouth every 6 (six) hours as needed for mild pain (or Fever >/= 101). 30 tablet 0  . anastrozole (ARIMIDEX) 1 MG tablet Take 1 tablet (1 mg total) by mouth daily.    Marland Kitchen dronabinol (MARINOL) 2.5 MG capsule Take 1 capsule (2.5 mg total) by mouth daily before lunch.    . famotidine (PEPCID) 20 MG tablet Take 20 mg by mouth daily.    . feeding supplement (ENSURE ENLIVE / ENSURE PLUS) LIQD Take 237 mLs by mouth daily. 237 mL 0  . hydrocortisone cream 1 % Apply topically 2 (two) times daily. (Patient taking differently: Apply 1 application topically 2 (two) times daily. To the abdomen area) 30 g 0  . losartan (COZAAR) 25 MG tablet Take 1 tablet (25 mg total) by mouth daily. 30 tablet 0  . metoCLOPramide (REGLAN) 10 MG tablet Take 1 tablet (10 mg total) by mouth every 8 (eight) hours as needed for nausea. (Patient not taking: No sig reported) 30 tablet 0  . Multiple Vitamin (MULTIVITAMIN) LIQD Take 15 mLs by mouth daily. 870 mL 0  . Nutritional Supplements  (,FEEDING SUPPLEMENT, PROSOURCE PLUS) liquid Take 30 mLs by mouth daily. 30 mL 0  . ondansetron (ZOFRAN-ODT) 4 MG disintegrating tablet Take 1 tablet (4 mg total) by mouth every 8 (eight) hours as needed for nausea or vomiting. 20 tablet 0  . oxyCODONE-acetaminophen (PERCOCET/ROXICET) 5-325 MG tablet Take 1-2 tablets by mouth every 4 (four) hours as needed for moderate pain. 30 tablet 0  . palbociclib (IBRANCE) 75 MG tablet TAKE 1 TABLET (75 MG TOTAL) BY MOUTH DAILY. TAKE FOR 21 DAYS ON, 7 DAYS OFF, REPEAT EVERY 28 DAYS. 21 tablet 6  . pantoprazole (PROTONIX) 40 MG tablet Take 40 mg by mouth daily.    . potassium chloride (KLOR-CON) 10 MEQ tablet Take 40 mEq by mouth daily.    . prochlorperazine (COMPAZINE) 10 MG tablet Take 1 tablet (10 mg total) by mouth every 6 (six) hours as needed (Nausea or vomiting). 30 tablet 1  . pyridOXINE (B-6) 100 MG tablet Take 1 tablet (100 mg total) by mouth daily. 30 tablet 0  . tamsulosin (FLOMAX) 0.4 MG CAPS capsule Take 1 capsule (0.4 mg total) by mouth daily. 30 capsule   . thiamine 100 MG tablet Take 1 tablet (100 mg total) by mouth daily. 30 tablet 0  . venlafaxine XR (EFFEXOR-XR) 75 MG 24 hr capsule Take 1 capsule (75 mg total) by mouth daily with breakfast. 90 capsule 4  . vitamin B-12 (CYANOCOBALAMIN) 1000 MCG tablet Take 1 tablet (1,000 mcg total) by mouth daily.     No current facility-administered medications on file prior to visit.    Physical Exam: General: chronically ill and frail appearing, lying in bed in NAD  EYES: anicteric sclera, no discharge  ENMT: intact hearing, oral mucous membranes moist CV: S1S2, RRR, +2 BLE edema,  Pulmonary: LCTA, no increased work of breathing, no cough, on 2L via nasal canula Abdomen: intake <25%, normo-active BS + 4 quadrants, mild tenderness to right lower quadrant, no ascites GU: deferred MSK: sarcopenia, non ambulatory Skin: warm and dry, generalized edema, mild bruising noted to bilateral lower  abdomen Neuro: generalized weakness,  Mild confusion Psych: non-anxious affect, A and O x 3 Hem/lymph/immuno: no widespread bruising   Thank you for the opportunity to participate in the care of Ms. Merriweather.  The palliative care team will continue to follow. Please call our office at [phone] if we can be of  additional assistance.   Jari Favre, DNP, AGPCNP-BC  COVID-19 PATIENT SCREENING TOOL Asked and negative response unless otherwise noted:   Have you had symptoms of covid, tested positive or been in contact with someone with symptoms/positive test in the past 5-10 days?

## 2020-06-22 DIAGNOSIS — E440 Moderate protein-calorie malnutrition: Secondary | ICD-10-CM | POA: Diagnosis not present

## 2020-06-22 DIAGNOSIS — A1813 Tuberculosis of other urinary organs: Secondary | ICD-10-CM | POA: Diagnosis not present

## 2020-06-22 DIAGNOSIS — L89154 Pressure ulcer of sacral region, stage 4: Secondary | ICD-10-CM | POA: Diagnosis not present

## 2020-06-22 DIAGNOSIS — L89324 Pressure ulcer of left buttock, stage 4: Secondary | ICD-10-CM | POA: Diagnosis not present

## 2020-06-22 DIAGNOSIS — D638 Anemia in other chronic diseases classified elsewhere: Secondary | ICD-10-CM | POA: Diagnosis not present

## 2020-06-22 DIAGNOSIS — R609 Edema, unspecified: Secondary | ICD-10-CM | POA: Diagnosis not present

## 2020-06-22 DIAGNOSIS — R339 Retention of urine, unspecified: Secondary | ICD-10-CM | POA: Diagnosis not present

## 2020-06-22 DIAGNOSIS — C799 Secondary malignant neoplasm of unspecified site: Secondary | ICD-10-CM | POA: Diagnosis not present

## 2020-06-22 DIAGNOSIS — I10 Essential (primary) hypertension: Secondary | ICD-10-CM | POA: Diagnosis not present

## 2020-06-23 DIAGNOSIS — E440 Moderate protein-calorie malnutrition: Secondary | ICD-10-CM | POA: Diagnosis not present

## 2020-06-23 DIAGNOSIS — R339 Retention of urine, unspecified: Secondary | ICD-10-CM | POA: Diagnosis not present

## 2020-06-23 DIAGNOSIS — D638 Anemia in other chronic diseases classified elsewhere: Secondary | ICD-10-CM | POA: Diagnosis not present

## 2020-06-23 DIAGNOSIS — M6281 Muscle weakness (generalized): Secondary | ICD-10-CM | POA: Diagnosis not present

## 2020-06-23 DIAGNOSIS — L89324 Pressure ulcer of left buttock, stage 4: Secondary | ICD-10-CM | POA: Diagnosis not present

## 2020-06-23 DIAGNOSIS — R600 Localized edema: Secondary | ICD-10-CM | POA: Diagnosis not present

## 2020-06-23 DIAGNOSIS — I10 Essential (primary) hypertension: Secondary | ICD-10-CM | POA: Diagnosis not present

## 2020-06-23 DIAGNOSIS — C50919 Malignant neoplasm of unspecified site of unspecified female breast: Secondary | ICD-10-CM | POA: Diagnosis not present

## 2020-06-23 DIAGNOSIS — C799 Secondary malignant neoplasm of unspecified site: Secondary | ICD-10-CM | POA: Diagnosis not present

## 2020-06-23 DIAGNOSIS — R52 Pain, unspecified: Secondary | ICD-10-CM | POA: Diagnosis not present

## 2020-06-23 DIAGNOSIS — L89154 Pressure ulcer of sacral region, stage 4: Secondary | ICD-10-CM | POA: Diagnosis not present

## 2020-06-23 DIAGNOSIS — R609 Edema, unspecified: Secondary | ICD-10-CM | POA: Diagnosis not present

## 2020-06-23 DIAGNOSIS — A1813 Tuberculosis of other urinary organs: Secondary | ICD-10-CM | POA: Diagnosis not present

## 2020-06-24 ENCOUNTER — Non-Acute Institutional Stay: Payer: Medicare PPO | Admitting: Nurse Practitioner

## 2020-06-24 ENCOUNTER — Other Ambulatory Visit: Payer: Self-pay

## 2020-06-24 DIAGNOSIS — F419 Anxiety disorder, unspecified: Secondary | ICD-10-CM

## 2020-06-24 DIAGNOSIS — Z515 Encounter for palliative care: Secondary | ICD-10-CM

## 2020-06-24 NOTE — Progress Notes (Signed)
[facility] Consult Note Telephone: [phone]  Fax: [phone]    Date of encounter: 06/24/20 PATIENT NAME: Brooke [name] [name] [place] [address]   [phone] (home)  DOB: 1950/09/22 MRN: [id number]   PRIMARY CARE PROVIDER:    [name], MD,  [place] [address] [phone]  REFERRING PROVIDER:   [name], MD [address] [place],  [place] [address] [phone]  RESPONSIBLE PARTY:    Contact Information    Name Relation Home Work 1 Brooke [name]   Brooke [name]    [phone]   [place], [place] other [phone]     Brooke [name] Sister [phone]  [phone]   Brooke [name] [phone]        I met face to face with patient in facility. Palliative Care was asked to follow this patient by consultation request of Dr. Isaias [name] address advance care planning and complex medical decision making. This is a follow up visit.                                  ASSESSMENT AND PLAN / RECOMMENDATIONS:   Advance Care Planning/Goals of Care: Goals include to maximize quality of life and symptom management. Our advance care planning conversation included a discussion about:     Decision to de-escalate disease focused treatments due to poor prognosis.  CODE STATUS: DNR Goal of care: Patient HCPOA Brooke [name] stated that patient's goal of care at this time is comfort, she does not desire any more  treatment for patient's cancer.  Directives: Signed DNR and MOST forms present on file in facility, copy on [facility] EMR.  Symptom Management/Plan: Agitation/anxiety: Agitation and anxiety related to end of life.  Plan: Start Lorazepam 0.$RemoveBeforeDEI'5mg'TtiqLkLCmQbkVcVe$  tablet by mouth every 4 hours  As needed for anxiety/agitation. Discontinue all medications except Morphine. Complete oral care every 2-4hrs and as needed. Generalized pain: pain is ongoing but  stable. Continue Morphine sulphate $RemoveBeforeDEI'20mg'aNedmLgDSfzpiEQC$ /ml, take 0.85ml by mouth every 4hrs as needed for pain.  Visit update discussed with Brooke [name], she requested for Hospice care for patient, requested that patient be transferred to Brooke [name] home) to provide more symptom management.  I spent 60 minutes providing this consultation. More than 50% of the time in this consultation was spent in counseling and care coordination.  PPS: 10%  HOSPICE ELIGIBILITY/DIAGNOSIS: yes/ metastatic breast cancer. Verbal order received for Hospice care from Brooke [name] provider.  CHIEF COMPLAIN: agitation/anxiety  History obtained from review of Epic EMR, discussion with facility staff, and interview with her HCPOA.  HISTORY OF PRESENT ILLNESS:  Brooke [name] is a 70 y.o. year old female with anxiety and agitation in the context of end of life symptoms related to metastatic beast cancer. Condition associated with restlessness, not relieved with pain medication Morphine. Patient currently on continuos oxygen at 4L. No report of fever, chills, or SOB. Limited ROS due to poor cognition.    I reviewed available labs, medications, imaging, studies and related documents from the EMR.  Records reviewed and summarized above.   Physical Exam: General:chronically ill andfrail appearing CV: generalized edema, Pulmonary:  no increased work of breathing Abdomen: mild ascities YI:AXKPVVZS MSK: sarcopenia,nonambulatory Skin: skin tear noted to right lower leg, generalized edema Neuro: generalized weakness,  confused Psych: anxious affect, agitated Hem/lymph/immuno: no widespread bruising  Past Medical History:  Diagnosis Date  .  Anemia   . Anxiety   . Arthritis   . ASCUS of cervix with negative high risk HPV 12/2018  . Bilateral dry eyes   . Breast cancer, left breast ([facility])   . Family history of breast cancer 02/02/2017  . Family history of breast cancer   . Family history of colon cancer 02/02/2017  .  Family history of colon cancer   . Family history of prostate cancer   . Family history of prostate cancer in father 02/02/2017  . Family history of uterine cancer   . Fibroid   . History of kidney stones   . Hyperlipidemia   . Hypertension   . Melanoma of upper arm ([place]) 2009   RIGHT ARM   . Obesity (BMI 30.0-34.9)   . Osteopenia 09/2017   T score -1.9 max 8% / 0.8% statistically significant decline at right and left hip stable at the spine  . PONV (postoperative nausea and vomiting)   . Squamous carcinoma     of the skin  . UTI (urinary tract infection)   . Varicose veins of both lower extremities   . Vitamin D deficiency 07/2009   LOW VITAMIN D 29  . Vitamin deficiency 07/2008   VITAMIN D LOW 23    Thank you for the opportunity to participate in the care of Brooke [name].  The palliative care team will continue to follow. Please call our office at [phone] if we can be of additional assistance.   Jari Favre, DNP, AGPCNP-BC  COVID-19 PATIENT SCREENING TOOL Asked and negative response unless otherwise noted:   Have you had symptoms of covid, tested positive or been in contact with someone with symptoms/positive test in the past 5-10 days?

## 2020-06-25 DIAGNOSIS — L89154 Pressure ulcer of sacral region, stage 4: Secondary | ICD-10-CM | POA: Diagnosis not present

## 2020-06-28 DEATH — deceased

## 2020-07-06 ENCOUNTER — Inpatient Hospital Stay: Payer: Medicare PPO

## 2020-07-06 ENCOUNTER — Inpatient Hospital Stay: Payer: Medicare PPO | Admitting: Oncology

## 2020-07-06 NOTE — Progress Notes (Incomplete)
Brooke [name]  [phone] [phone]    ID: Brooke [name] DOB: 29-Apr-1950  MR#: [id number]  VQX#:[id number]  Patient Care Team: [name], MD as PCP - General (Family Medicine) Rolm Bookbinder, MD as Attending Physician (Dermatology) Magrinat, Virgie Dad, MD as Consulting Physician (Oncology) Kyung Rudd, MD as Consulting Physician (Radiation Oncology) Jovita Kussmaul, MD as Consulting Physician (General Surgery) Irene Limbo, MD as Consulting Physician (Plastic Surgery) Delice Bison, Charlestine Massed, NP as Nurse Practitioner (Hematology and Oncology) [name], MD as Consulting Physician (Obstetrics and Gynecology) Laurey Morale, MD as Consulting Physician (Family Medicine) Wenda Low, MD as Consulting Physician (Internal Medicine) OTHER MD:   CHIEF COMPLAINT: Estrogen receptor positive breast cancer now widely metastatic  CURRENT TREATMENT: anastrozole; palbociclib   INTERVAL HISTORY: Brooke [name] returns today for follow-up of her stage IV breast cancer, which is weakly estrogen receptor positive.  She is currently residing at [facility].  Palliative care is also visiting her there.  She started anastrozole on 05/07/2020 while in the hospital. She is tolerating this well with no side effects she is aware of.  She started palbociclib on 05/15/2020.  She tolerated it well with no side effects that she is aware of  She started zoledronate on 03/19/2020, to be repeated every 12 weeks.  However this caused her severe bone pain.  That treatment is currently on hold   REVIEW OF SYSTEMS: Brooke [name] 19 VACCINATION STATUS: fully vaccinated [employer]), with booster 12/28/2019    HISTORY OF CURRENT ILLNESS: From the original intake note:  Brooke [name] herself palpated a change in her left breast and brought this to medical attention.  On 01/18/2017 she underwent bilateral diagnostic mammography with tomography and bilateral breast  ultrasonography at [facility].  The breast density was category B.  At the most recent exam was from December 2016.  In the right breast upper outer quadrant there was a 1 cm irregular mass associated with punctate calcification.  By ultrasound this was not well-defined, and it was biopsied on 01/26/2017 with tomographic guidance this showed a complex sclerosing lesion with the usual ductal hyperplasia.  The right axilla was sonographically  On the left mammography showed a 4 cm dense mass with indistinct margins in the upper inner quadrant.  Ultrasound defined a 3 cm irregular mass in the upper inner quadrant of the left breast which was palpable.  There was an abnormal lymph node in the left axilla.  Biopsy of the left mass and abnormal lymph node (SAA [id number], on 01/26/2017) showed both to be involved by invasive ductal carcinoma, with some lobular features but E-cadherin positive, estrogen and progesterone receptors both 100% positive, with strong staining intensity, with MIB-1 between 15-20%.  There was no HER-2 amplification, the signals ratio being 1.31/1.58 and the number per cell 2.30/2.05  The patient's subsequent history is as detailed below.   PAST MEDICAL HISTORY: Past Medical History:  Diagnosis Date  . Anemia   . Anxiety   . Arthritis   . ASCUS of cervix with negative high risk HPV 12/2018  . Bilateral dry eyes   . Breast cancer, left breast ([place])   . Family history of breast cancer 02/02/2017  . Family history of breast cancer   . Family history of colon cancer 02/02/2017  . Family history of colon cancer   . Family history of prostate cancer   . Family history of prostate cancer in father 02/02/2017  . Family history of uterine cancer   .  Fibroid   . History of kidney stones   . Hyperlipidemia   . Hypertension   . Melanoma of upper arm ([place]) 2009   RIGHT ARM   . Obesity (BMI 30.0-34.9)   . Osteopenia 09/2017   T score -1.9 max 8% / 0.8% statistically significant decline at  right and left hip stable at the spine  . PONV (postoperative nausea and vomiting)   . Squamous carcinoma     of the skin  . UTI (urinary tract infection)   . Varicose veins of both lower extremities   . Vitamin D deficiency 07/2009   LOW VITAMIN D 29  . Vitamin deficiency 07/2008   VITAMIN D LOW 23    PAST SURGICAL HISTORY: Past Surgical History:  Procedure Laterality Date  . ABDOMINAL SURGERY  1975   ovairan cyst   . APPENDECTOMY    . BREAST LUMPECTOMY WITH RADIOACTIVE SEED LOCALIZATION Right 09/25/2017   Procedure: RIGHT BREAST LUMPECTOMY WITH RADIOACTIVE SEED LOCALIZATION;  Surgeon: Jovita Kussmaul, MD;  Location: [place];  Service: General;  Laterality: Right;  . BREAST SURGERY     bio  . CHOLECYSTECTOMY    . DERMOID CYST  EXCISION    . DILATATION & CURETTAGE/HYSTEROSCOPY WITH MYOSURE N/A 04/23/2019   Procedure: DILATATION & CURETTAGE/HYSTEROSCOPY WITH MYOSURE;  Surgeon: [name], MD;  Location: [place];  Service: Gynecology;  Laterality: N/A;  request to follow at 9:30am in [facility] block requests 45 minutes  . DILATION AND CURETTAGE OF UTERUS  1998  . GALLBLADDER SURGERY    . HYSTEROSCOPY    . insertion  port a cath  03/02/2016  . MASTECTOMY MODIFIED RADICAL Left 09/25/2017   Procedure: LEFT MODIFIED RADICAL MASTECTOMY;  Surgeon: Jovita Kussmaul, MD;  Location: [place];  Service: General;  Laterality: Left;  . PORTACATH PLACEMENT N/A 03/02/2017   Procedure: INSERTION PORT-A-CATH;  Surgeon: Jovita Kussmaul, MD;  Location: [place];  Service: General;  Laterality: N/A;  . SKIN BIOPSY    . SKIN CANCER EXCISION  2010,2011   2010-RIGHT ARM, 2011-LEFT LOWER LEG.-DR Rolm Bookbinder DERM. DR. Sarajane Jews IS HER DERM SURGEON.    FAMILY HISTORY Family History  Problem Relation Age of Onset  . Diabetes Father   . Hypertension Father   . Heart disease Father   . Prostate cancer Father 66  . Arthritis Father   . Breast cancer Sister 29       again at age 64, GT  results unk  . Skin cancer Sister   . Colon cancer Paternal Grandfather 78  . Hypertension Mother   . Heart failure Mother   . Uterine cancer Sister 1       stage IV  . Breast cancer Cousin        mat first cousin dx under 16  The patient's father was diagnosed with prostate cancer in his 37s and died at age 75 from complications of that disease.  The patient's mother died at age 17 with congestive heart failure.  The patient had no brothers, 3 sisters.  One sister had breast cancer at age 21 and again at age 33.  She has been genetically tested and was found to be BRCA not mutated in addition a paternal grandfather had colon cancer in his 35s.  There is no history of ovarian cancer in the family to the patient's knowledge.   GYNECOLOGIC HISTORY:  Patient's last menstrual period was 03/18/1997. Menarche age 2. The patient is GX P0.  She stopped having periods in 1999 and took hormone replacement approximately 5 years.   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  She is now retired.  She is single and lived alone with no pets. She is currently in a skilled nursing facility ([name]).   ADVANCED DIRECTIVES: Do not resuscitate order is in place.  The patient believes she has named her sister [name] as Special educational needs teacher of attorney [phone]).   HEALTH MAINTENANCE: Social History   Tobacco Use  . Smoking status: Never Smoker  . Smokeless tobacco: Never Used  Vaping Use  . Vaping Use: Never used  Substance Use Topics  . Alcohol use: No  . Drug use: No    Colonoscopy: Never  PAP: 12/2019, negative  Bone density: 2017/osteopenia   Allergies  Allergen Reactions  . Neosporin [Neomycin-Bacitracin Zn-Polymyx] Swelling, Rash and Other (See Comments)    Blisters  . Bactrim [Sulfamethoxazole-Trimethoprim]     Made her feel very sick  . Benzalkonium Chloride Other  (See Comments)    Redness (Pt is unaware)  It works by killing microorganisms and inhibiting their future growth, and for this reason frequently appears as an ingredient in antibacterial hand wipes, antiseptic creams and anti-itch ointments.  . Latex Rash    Current Outpatient Medications  Medication Sig Dispense Refill  . acetaminophen (TYLENOL) 325 MG tablet Take 2 tablets (650 mg total) by mouth every 6 (six) hours as needed for mild pain (or Fever >/= 101). 30 tablet 0  . anastrozole (ARIMIDEX) 1 MG tablet Take 1 tablet (1 mg total) by mouth daily.    Marland Kitchen dronabinol (MARINOL) 2.5 MG capsule Take 1 capsule (2.5 mg total) by mouth daily before lunch.    . famotidine (PEPCID) 20 MG tablet Take 20 mg by mouth daily.    . feeding supplement (ENSURE ENLIVE / ENSURE PLUS) LIQD Take 237 mLs by mouth daily. 237 mL 0  . hydrocortisone cream 1 % Apply topically 2 (two) times daily. (Patient taking differently: Apply 1 application topically 2 (two) times daily. To the abdomen area) 30 g 0  . losartan (COZAAR) 25 MG tablet Take 1 tablet (25 mg total) by mouth daily. 30 tablet 0  . metoCLOPramide (REGLAN) 10 MG tablet Take 1 tablet (10 mg total) by mouth every 8 (eight) hours as needed for nausea. (Patient not taking: No sig reported) 30 tablet 0  . Multiple Vitamin (MULTIVITAMIN) LIQD Take 15 mLs by mouth daily. 870 mL 0  . Nutritional Supplements (,FEEDING SUPPLEMENT, PROSOURCE PLUS) liquid Take 30 mLs by mouth daily. 30 mL 0  . ondansetron (ZOFRAN-ODT) 4 MG disintegrating tablet Take 1 tablet (4 mg total) by mouth every 8 (eight) hours as needed for nausea or vomiting. 20 tablet 0  . oxyCODONE-acetaminophen (PERCOCET/ROXICET) 5-325 MG tablet Take 1-2 tablets by mouth every 4 (four) hours as needed for moderate pain. 30 tablet 0  . palbociclib (IBRANCE) 75 MG tablet TAKE 1 TABLET (75 MG TOTAL) BY MOUTH DAILY. TAKE FOR 21 DAYS ON, 7 DAYS OFF, REPEAT EVERY 28 DAYS. 21 tablet 6  . pantoprazole (PROTONIX) 40  MG tablet Take 40 mg by mouth daily.    . potassium chloride (KLOR-CON) 10 MEQ tablet Take 40 mEq by mouth daily.    . prochlorperazine (COMPAZINE) 10 MG tablet Take 1 tablet (10 mg total) by mouth every 6 (six) hours as needed (Nausea or vomiting). 30 tablet 1  .  pyridOXINE (B-6) 100 MG tablet Take 1 tablet (100 mg total) by mouth daily. 30 tablet 0  . tamsulosin (FLOMAX) 0.4 MG CAPS capsule Take 1 capsule (0.4 mg total) by mouth daily. 30 capsule   . thiamine 100 MG tablet Take 1 tablet (100 mg total) by mouth daily. 30 tablet 0  . venlafaxine XR (EFFEXOR-XR) 75 MG 24 hr capsule Take 1 capsule (75 mg total) by mouth daily with breakfast. 90 capsule 4  . vitamin B-12 (CYANOCOBALAMIN) 1000 MCG tablet Take 1 tablet (1,000 mcg total) by mouth daily.     No current facility-administered medications for this visit.     OBJECTIVE:  white woman examined in a wheelchair There were no vitals filed for this visit.   There is no height or weight on file to calculate BMI.   Wt Readings from Last 3 Encounters:  05/28/20 194 lb 7.1 oz (88.2 kg)  05/02/20 182 lb (82.6 kg)  04/30/20 185 lb 3.2 oz (84 kg)  ECOG FS:1 - Symptomatic but completely ambulatory   Sclerae unicteric, EOMs intact Wearing a mask No cervical or supraclavicular adenopathy Lungs no rales or rhonchi Heart regular rate and rhythm Abd soft, nontender, positive bowel sounds MSK no focal spinal tenderness, no upper extremity lymphedema Neuro: nonfocal, well oriented, appropriate affect Breasts:    {Sclerae unicteric, EOMs intact Lungs no rales or rhonchi Heart regular rate and rhythm Abd soft, obese, nontender, positive bowel sounds MSK no focal spinal tenderness, bilateral lower extremity lymphedema, which appears improved Neuro: nonfocal, confused, anxious affect Breasts: The right breast is benign.  The left breast is status postmastectomy with no obvious chest wall recurrence.  Both axillae are benign. Skin: A subcutaneous  cancer lesion at the nape of the neck and a second 1 above the right eyebrow are both slightly smaller than previously}   LAB RESULTS:  CMP     Component Value Date/Time   NA 139 06/11/2020 1547   NA 141 03/03/2017 1358   K 3.1 (L) 06/11/2020 1547   K 3.3 (L) 03/03/2017 1358   CL 97 (L) 06/11/2020 1547   CO2 24 06/11/2020 1547   CO2 28 03/03/2017 1358   GLUCOSE 88 06/11/2020 1547   GLUCOSE 107 03/03/2017 1358   BUN 6 (L) 06/11/2020 1547   BUN 12.7 03/03/2017 1358   CREATININE 0.52 06/11/2020 1547   CREATININE 0.83 02/19/2020 0936   CREATININE 0.9 03/03/2017 1358   CALCIUM 8.4 (L) 06/11/2020 1547   CALCIUM 9.4 03/03/2017 1358   PROT 4.4 (L) 06/11/2020 1547   PROT 6.5 03/03/2017 1358   ALBUMIN 2.4 (L) 06/11/2020 1547   ALBUMIN 3.7 03/03/2017 1358   AST 78 (H) 06/11/2020 1547   AST 43 (H) 09/12/2018 1354   AST 23 03/03/2017 1358   ALT 17 06/11/2020 1547   ALT 84 (H) 09/12/2018 1354   ALT 25 03/03/2017 1358   ALKPHOS 195 (H) 06/11/2020 1547   ALKPHOS 71 03/03/2017 1358   BILITOT 1.4 (H) 06/11/2020 1547   BILITOT 0.6 09/12/2018 1354   BILITOT 0.52 03/03/2017 1358   GFRNONAA >60 06/11/2020 1547   GFRNONAA >60 09/12/2018 1354   GFRAA >60 06/12/2019 1401   GFRAA >60 09/12/2018 1354    No results found for: TOTALPROTELP, ALBUMINELP, A1GS, A2GS, BETS, BETA2SER, GAMS, MSPIKE, SPEI  No results found for: KPAFRELGTCHN, LAMBDASER, KAPLAMBRATIO  Lab Results  Component Value Date   WBC 4.0 06/11/2020   NEUTROABS 2.8 06/11/2020   HGB 9.8 (L) 06/11/2020   HCT  29.6 (L) 06/11/2020   MCV 103.9 (H) 06/11/2020   PLT 186 06/11/2020      Chemistry      Component Value Date/Time   NA 139 06/11/2020 1547   NA 141 03/03/2017 1358   K 3.1 (L) 06/11/2020 1547   K 3.3 (L) 03/03/2017 1358   CL 97 (L) 06/11/2020 1547   CO2 24 06/11/2020 1547   CO2 28 03/03/2017 1358   BUN 6 (L) 06/11/2020 1547   BUN 12.7 03/03/2017 1358   CREATININE 0.52 06/11/2020 1547   CREATININE 0.83  02/19/2020 0936   CREATININE 0.9 03/03/2017 1358      Component Value Date/Time   CALCIUM 8.4 (L) 06/11/2020 1547   CALCIUM 9.4 03/03/2017 1358   ALKPHOS 195 (H) 06/11/2020 1547   ALKPHOS 71 03/03/2017 1358   AST 78 (H) 06/11/2020 1547   AST 43 (H) 09/12/2018 1354   AST 23 03/03/2017 1358   ALT 17 06/11/2020 1547   ALT 84 (H) 09/12/2018 1354   ALT 25 03/03/2017 1358   BILITOT 1.4 (H) 06/11/2020 1547   BILITOT 0.6 09/12/2018 1354   BILITOT 0.52 03/03/2017 1358       No results found for: LABCA2  No components found for: [id number]  No results for input(s): INR in the last 168 hours.  No results found for: LABCA2  Lab Results  Component Value Date   HYI502 32 03/06/2020    No results found for: DXA128  No results found for: NOM767  Lab Results  Component Value Date   CA2729 1,504.7 (H) 05/08/2020    No components found for: HGQUANT  Lab Results  Component Value Date   CEA1 570.0 (H) 05/08/2020   /  CEA  Date Value Ref Range Status  05/08/2020 570.0 (H) 0.0 - 4.7 ng/mL Final    Comment:    (NOTE)                             Nonsmokers          <3.9                             Smokers             <5.6 [employer] Electrochemiluminescence Immunoassay (ECLIA) Values obtained with different assay methods or kits cannot be used interchangeably.  Results cannot be interpreted as absolute evidence of the presence or absence of malignant disease. Performed At: [facility] [place], [place] [id number] Rush Farmer MD EZ:[id number]      No results found for: AFPTUMOR  No results found for: [place]  No results found for: HGBA, HGBA2QUANT, HGBFQUANT, HGBSQUAN (Hemoglobinopathy evaluation)   No results found for: LDH  Lab Results  Component Value Date   IRON 52 02/19/2020   TIBC 327 02/19/2020   IRONPCTSAT 16 02/19/2020   (Iron and TIBC)  Lab Results  Component Value Date   FERRITIN 298 (H) 02/19/2020     Urinalysis    Component Value Date/Time   COLORURINE YELLOW 05/03/2020 [place] (A) 05/03/2020 0415   LABSPEC 1.011 05/03/2020 [place] 6.0 05/03/2020 [place] 05/03/2020 0415   GLUCOSEU NEGATIVE 11/05/2012 1559   [place] (A) 05/03/2020 [place] 05/03/2020 0415   BILIRUBINUR neg 08/10/2018 1529   KETONESUR 20 (A) 05/03/2020 0415   PROTEINUR 30 (  A) 05/03/2020 0415   UROBILINOGEN 0.2 08/10/2018 1529   UROBILINOGEN 0.2 11/05/2012 1559   NITRITE NEGATIVE 05/03/2020 0415   LEUKOCYTESUR LARGE (A) 05/03/2020 0415    STUDIES: No results found.   ELIGIBLE FOR AVAILABLE RESEARCH PROTOCOL: no   ASSESSMENT: 70 y.o. Brooke [name] woman status post bilateral biopsies 01/26/2017, showing  (1) in the right breast, a complex sclerosing lesion, status post lumpectomy 09/25/2017, with no malignancy identified.  (2) in the left breast, a cT2 pN1 invasive ductal carcinoma (with some lobular features but E-cadherin positive), grade 2, estrogen and progesterone receptor positive, HER-2 not amplified, with an MIB-1 of 15-20%  (a) breast MRI 02/04/2017 suggests a T3 N1 tumor  (3) started neoadjuvant cyclophosphamide/docetaxel 03/03/2017, discontinued after 1 cycle with very poor tolerance  (a) started cyclophosphamide/methotrexate/fluorouracil (CMF) 03/28/2017, repeated x7 cycles, last dose 08/01/2017  (4) status post left modified radical mastectomy 09/25/17 for a ypT5 ypN2, stage IIIA invasive ductal carcinoma, grade 2, again estrogen and progesterone receptor positive, HER-2 not amplified  (5) adjuvant radiation completed 12/21/2017  (a) capecitabine sensitization tolerated only the initial week of radiation (b) Site/dose: The patient initially received a dose of 50.4 Gy in 28 fractions to the left chest wall and left supraclavicular region. This was delivered using a 3-D conformal, 4 field technique. The patient then received a boost  to the mastectomy scar. This delivered an additional 10 Gy in 5 fractions using an en face electron field. The total dose was 60.4 Gy.  ( 6) tamoxifen started 03/09/2018, discontinued January 2022 with metastatic progression  (7) genetics testing 12/12/2017 through the Multi-Gene Panel offered by [facility] found no deleterious mutations in AIP, ALK, APC, ATM, AXIN2,BAP1,  BARD1, BLM, BMPR1A, BRCA1, BRCA2, BRIP1, CASR, CDC73, CDH1, CDK4, CDKN1B, CDKN1C, CDKN2A (p14ARF), CDKN2A (p16INK4a), CEBPA, CHEK2, CTNNA1, DICER1, DIS3L2, EGFR (c.2369C>T, p.Thr790Met variant only), EPCAM (Deletion/duplication testing only), FH, FLCN, GATA2, GPC3, GREM1 (Promoter region deletion/duplication testing only), HOXB13 (c.251G>A, p.Gly84Glu), HRAS, KIT, MAX, MEN1, MET, MITF (c.952G>A, p.Glu318Lys variant only), MLH1, MSH2, MSH3, MSH6, MUTYH, NBN, NF1, NF2, NTHL1, PALB2, PDGFRA, PHOX2B, PMS2, POLD1, POLE, POT1, PRKAR1A, PTCH1, PTEN, RAD50, RAD51C, RAD51D, RB1, RECQL4, RET, RUNX1, SDHAF2, SDHA (sequence changes only), SDHB, SDHC, SDHD, SMAD4, SMARCA4, SMARCB1, SMARCE1, STK11, SUFU, TERC, TERT, TMEM127, TP53, TSC1, TSC2, VHL, WRN and WT1.   (8) Foundation 1 testing shows stable microsatellite status and 1 mutation/ Mb, TP53 mutations x2  (a) PD-L1 dated July 2019 negative  METASTATIC DISEASE: January 2022 (9) CT angio of the chest 02/27/2020 shows multiple hilar nodes consistent with prior granulomatous infection, multiple small pulmonary nodules, multiple punctate granulomas throughout the liver and spleen, nodular hepatic contour and areas of mottling possibly due to steatosis, 1.6 cm left adrenal nodule  (a) PET scan 03/05/2020 shows mildly to highly hypermetabolic adenopathy, the pulmonary nodules being mildly to not hypermetabolic, a large right hepatic liver lesion with an SUV of 13.2 and additional hypermetabolic right and left lobe foci, the adrenal lesion being hypermetabolic with an SUV of 16.  There are also peritoneal  nodules and innumerable foci of bony involvement.  Incidental note was made of small heterogeneous foci of increased uptake in the cerebellum.  The CT images did not show gross abnormality in the visualized brain  (b) on 03/09/2020 CA 27-29 was 983.0, CEA 744.17, the CA 19-9 normal at 32  (c) liver biopsy 03/13/2020 confirms metastati carcinoma, estrogen receptor positive, progesterone and HER 2 negative, with an Mib-1 of 40%  (d) brain MRI 03/26/2020 shows dural involvement  including a 1.0 cm dural-based cerebellar lesion  (10) paclitaxel started 03/30/2020, to be repeated weekly as tolerated  (11) zoledronate started 03/19/2020, to be repeated every 12 weeks  (12)anastrozole started 05/07/2020             (a) palbociclib 125 mg daily, 21 days off, 7 days off, started 05/16/2020  (13) severe thiamine depletion requiring emergent IV repletion 05/20/2020             (a) continue oral thiamine replacement nindefinitely   PLAN: Brooke [name] is very confused.  This could be due to medication, could be due to being transported to a completely different area today, could be due to depression.  She certainly is very anxious and I think she might respond to venlafaxine 75 mg.  I went ahead and wrote that on my order sheet.  To the best of my ability to tell she is tolerating the anastrozole and the palbociclib without complications.  Today her CBC is adequate to resume the palbociclib and I have written for it to be restarted 06/16/2020, at 75 mg daily for 21 days, then off for 7 days.  The total bilirubin is decreasing, and the albumin is increasing.  Those are encouraging.  The ALT remains normal, with slight upward trend in the AST and alkaline phosphatase.  The potassium was 3.1 and I suggested they may want a make sure she is taking those potassium supplements regularly as they are very difficult to take and she has problems with swallowing.  Otherwise the plan for now is to continue as before.  She  will return to see me 07/06/2020 and we will reassess at that time.  Total encounter time 30 minutes.*   Magrinat, Virgie Dad, MD  07/06/20 12:01 AM Medical Oncology and Hematology [facility] [place], [place] [address] Tel. [phone]    Fax. [phone]   I, Wilburn Mylar, am acting as scribe for Dr. Virgie Dad. Magrinat.  I, Lurline Del MD, have reviewed the above documentation for accuracy and completeness, and I agree with the above.   *Total Encounter Time as defined by the Centers for Medicare and Medicaid Services includes, in addition to the face-to-face time of a patient visit (documented in the note above) non-face-to-face time: obtaining and reviewing outside history, ordering and reviewing medications, tests or procedures, care coordination (communications with other health care professionals or caregivers) and documentation in the medical record.

## 2020-07-07 ENCOUNTER — Encounter: Payer: Self-pay | Admitting: Oncology

## 2020-07-22 ENCOUNTER — Encounter: Payer: Self-pay | Admitting: Oncology

## 2021-01-14 ENCOUNTER — Ambulatory Visit: Payer: Self-pay | Admitting: Obstetrics & Gynecology

## 2022-09-20 IMAGING — CT NM PET TUM IMG INITIAL (PI) SKULL BASE T - THIGH
6 of 7 series · 19 of 25 positions shown · non-contrast
Comparison: CT of the chest of February 27, 2020

CLINICAL DATA: Initial treatment strategy for breast cancer in a
69-year-old female recently found to have numerous pulmonary nodules
and abnormalities in the liver and LEFT adrenal.

EXAM:
NUCLEAR MEDICINE PET SKULL BASE TO THIGH
TECHNIQUE: 10.46 mCi F-18 FDG was injected intravenously. Full-ring PET imaging
was performed from the skull base to thigh after the radiotracer. CT
data was obtained and used for attenuation correction and anatomic
localization.
Fasting blood glucose: 147 mg/dl

[Series 3: pet sk_thigh ac · axial · 5.0mm · 4.07mm/px · z∈[-1000,-72]mm · 5 of 233 slices shown]
[im 1/233]
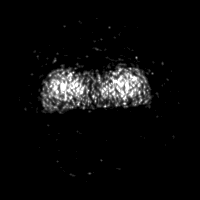
[im 47/233]
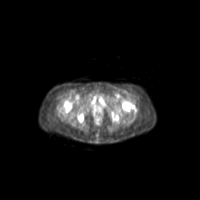
[im 140/233]
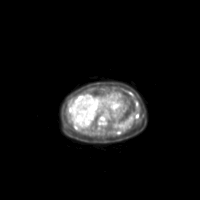
[im 186/233]
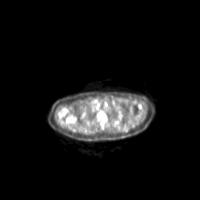
[im 233/233]
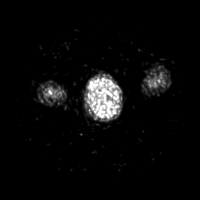

[Series 4: ct sk_thigh 5.0 bf37 · axial · 5.0mm · 0.98mm/px · z∈[-816,-72]mm · 4 of 233 slices shown]
[im 47/233]
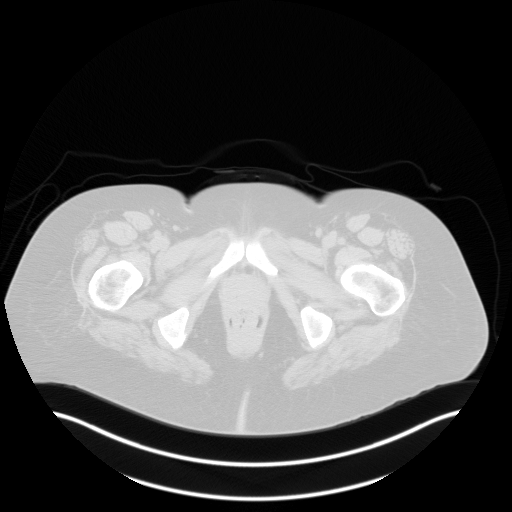
[im 93/233]
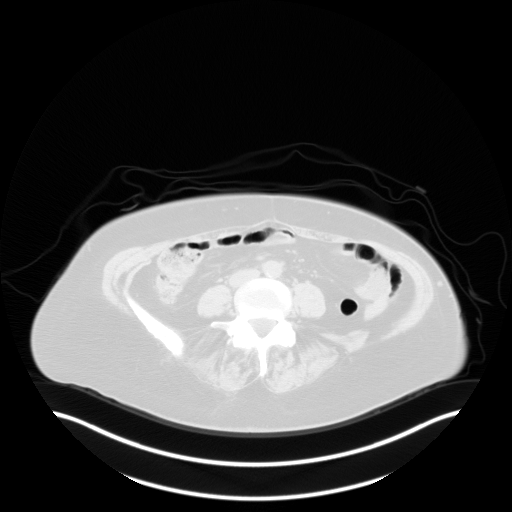
[im 140/233]
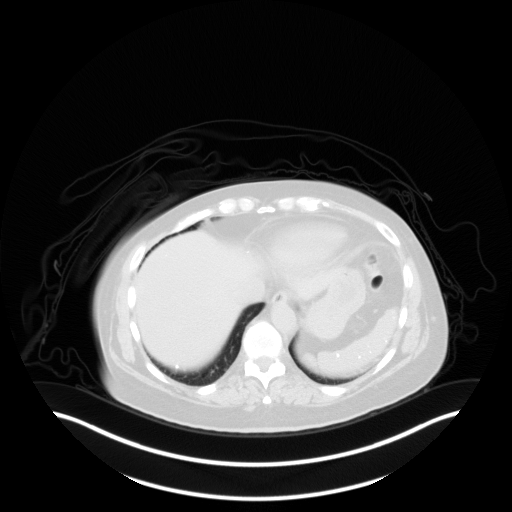
[im 233/233  brain]
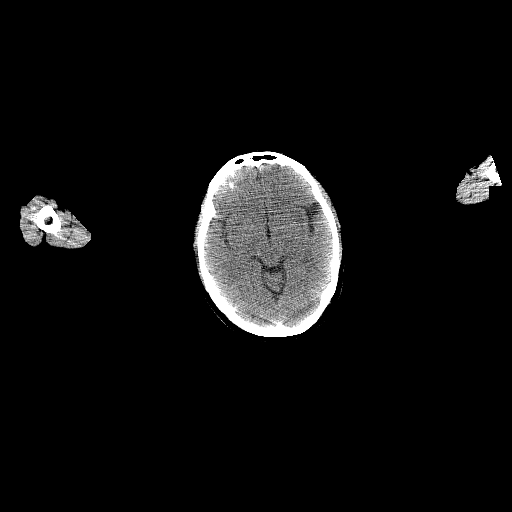

[Series 5: pet sk_thigh nac · axial · 5.0mm · 4.07mm/px · z∈[-1000,-72]mm · 4 of 233 slices shown]
[im 1/233]
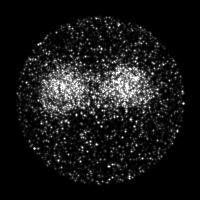
[im 59/233]
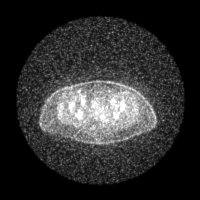
[im 175/233]
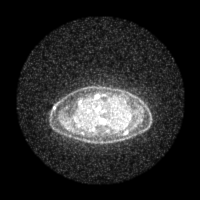
[im 233/233]
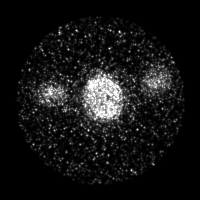

[Series 8: ct sk_thigh 5.0 br59 lung_bone · axial · 5.0mm · 0.64mm/px · 1 of 59 slices shown]
[im 1/59]
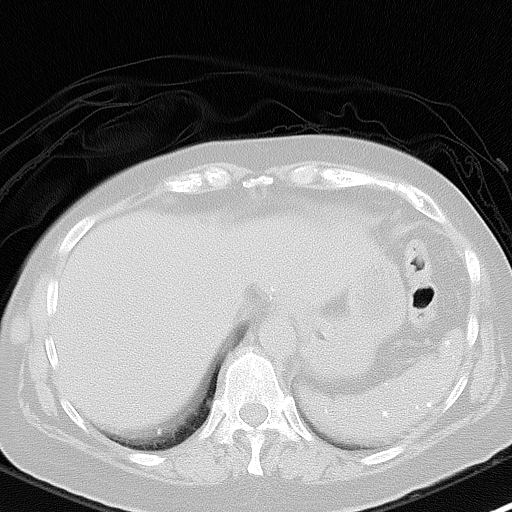

[Series 604: <mip collection> · coronal · 1.93mm/px · 1 of 32 slices shown]
[im 1/32]
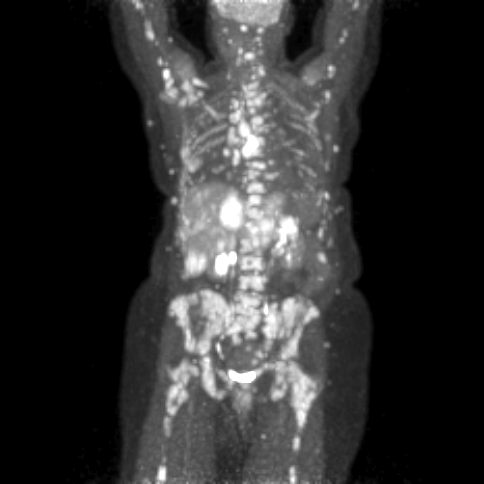

[Series 605: range-ct sk_thigh 5.0 bf37-tra-<alpha range> · 4 of 227 slices shown]
[im 1/227]
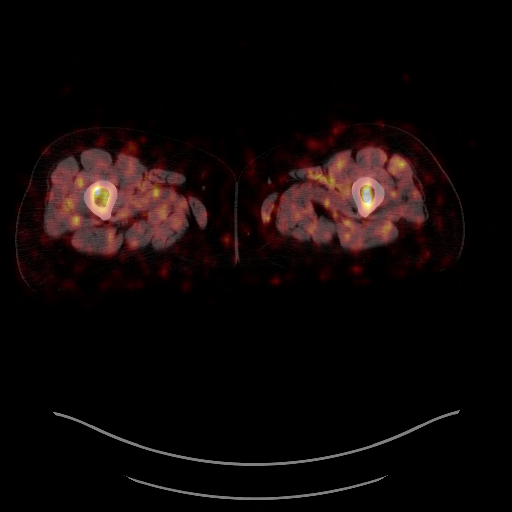
[im 57/227]
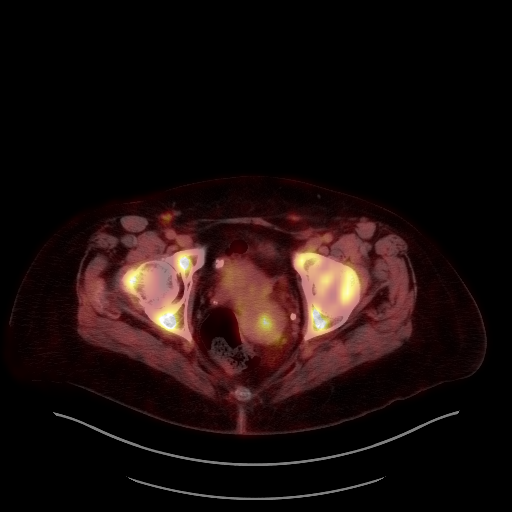
[im 170/227]
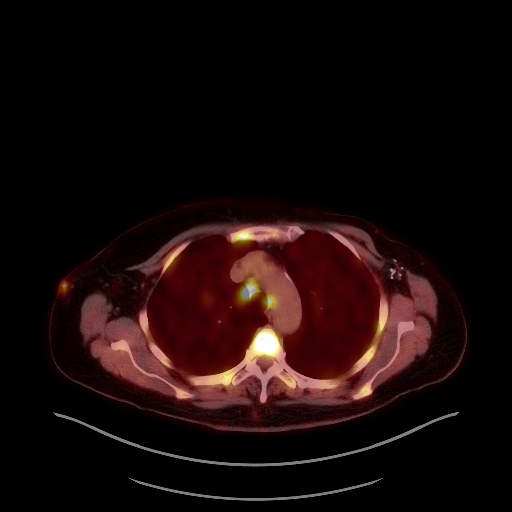
[im 227/227]
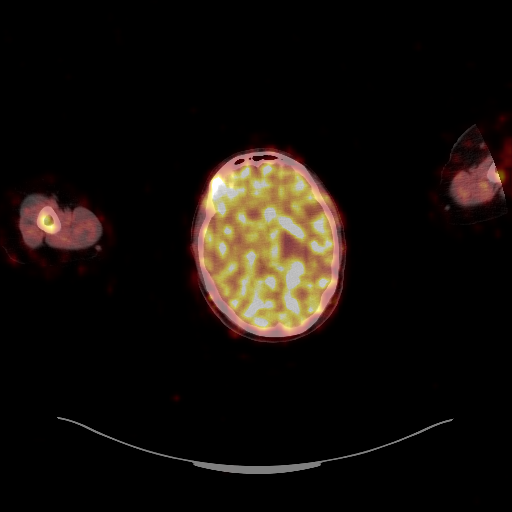

[19 of 25 positions shown; findings below may reference images not displayed]

FINDINGS: Mediastinal blood pool activity: SUV max

Liver activity: SUV max NA

NECK: Numerous small lymph nodes and lesions with increased
metabolism throughout the neck.

Two small adjacent 3-4 mm nodules on image 5 of series 4 in the
subcutaneous fat with a maximum SUV ofw

Intraparotid lesion in the RIGHT parotid gland measuring 6 mm (image
15, series 4) maximum SUV of

RIGHT level II lymph node on image 20 of series 4 measures 8 mm to 9
mm with a maximum SUV of 6.2 subcutaneous nodules scattered
throughout the subcutaneous fat of the posterior neck with another
lesion seen on image 22 of series 4 less than a cm size but with
increased metabolic activity similar to other areas in the neck.

Subcentimeter lymph node behind the RIGHT sternocleidomastoid on
image 38 of series 4 maximum SUV of 5.0.

Incidental CT findings: Tiny areas of more focal heterogeneous
uptake noted in the brain in the cerebellum though there is
generalized heterogeneity is not definitive for disease but
suspicious given the extensive nature of disease. The CT images show
no gross abnormality within the visualized brain.

CHEST: Multiple areas of increased metabolic activity throughout the
chest involving lymph nodes, pulmonary nodules and bones.

(Image 74, series 3) subcarinal lymph node 11 mm short axis with a
maximum SUV of approximately 22. Similarly hypermetabolic lymph
nodes throughout the mediastinum tracking towards the thoracic
inlet.

(Image 65, series 4) 16 mm RIGHT paratracheal lymph node with a max
SUV of 16.8.

Anterior mediastinal AP window and RIGHT hilar lymph nodes show
increase in metabolic activity as well.

Pulmonary nodule along the RIGHT heart border (image 87, series 4) 8
mm size with a maximum SUV of 3.8. Millimetric pulmonary nodules in
the bilateral chest grossly unchanged compared to the recent chest
CT without significant metabolic activity as measured on the PET
evaluation. For, faintly hypermetabolic focus corresponding to a
small nodule on image 22 of series 8 in the RIGHT upper lobe
adjacent to bronchovascular structures with mildly increased
metabolism.

LEFT hilar structures also with mildly increased metabolism though
not with the same level that is seen in the subcarinal lymph nodes
or lymph nodes elsewhere in the chest.

Incidental CT findings: Normal heart size without pericardial
effusion. Scattered atheromatous plaque in the thoracic aorta. Signs
of LEFT mastectomy and axillary dissection.

ABDOMEN/PELVIS: Areas of heterogeneity seen in the liver on the
chest CT display marked increased metabolism. Faint area of low
attenuation in the central liver near the IVC confluence biased
towards the RIGHT hepatic lobe on image 103 of series 4 is
hypermetabolic measuring approximately 4.8 x 4.2 cm with a maximum
SUV of 16.7.

Heterogeneity in the LEFT hepatic lobe showing predominantly low
attenuation measuring approximately 4.8 cm greatest axial dimension
on image 111 of series 4 with a maximum SUV approximately 12.9.

Large lesion in the inferior RIGHT hepatic lobe on image 130 of
series 4 with a maximum SUV of 13.2 and size of approximately
cm. Numerous additional foci of increased metabolism throughout both
the RIGHT and LEFT hepatic lobe compatible with diffuse hepatic
metastatic disease.

LEFT adrenal lesion seen on the recent CT is hypermetabolic. There
is also a pancreatic mass with increased metabolism. Pancreatic mass
measuring approximately 3.5 x 2.9 cm on image 110 of series 4 with a
maximum SUV of 16. Similarly hypermetabolic LEFT adrenal lesion on
image 110 of series 4. There is peripheral pancreatic atrophy
associated with the pancreatic lesion.

Increased metabolic activity seen in the head of the pancreas and
neck of the pancreas on images 128 and 121 of series [address] abnormality though some fullness in the pancreatic
head.

LEFT adrenal with small nodule showing increased metabolism on image
110 of series 4.

Small peritoneal nodules are noted with increased metabolism, for
instance on image 114 of series 4, an 11 mm peritoneal nodule is
noted with innumerable foci of nodularity scattered throughout the
upper abdomen and omentum, also within the retroperitoneum showing
areas of increased metabolic uptake. The lesion in the LEFT upper
quadrant shows an SUV of 5.6. Slightly larger 1.5 cm nodule along
the LEFT paracolic gutter with a maximum SUV of 9.5 on image 125 of
series 4

Precaval lymph node on image 143 of series 4 with a maximum SUV of 7
short axis dimension of 7 mm. Scattered smaller lymph nodes without
the same level of metabolic activity.

Lymph node or nodule in the LEFT mesorectum on image 173 of series 4
also with increased metabolic activity measuring 9.[address] findings: Calcifications in liver and spleen
compatible with prior granulomatous disease.

Post cholecystectomy. Staghorn calculus in the lower pole of the
RIGHT kidney measures approximately 2.3 x 1.9 cm. No perinephric
stranding. No hydronephrosis. Urinary bladder is collapsed. No acute
gastrointestinal process.

Calcified atheromatous plaque in the abdominal aorta without
aneurysmal dilation.

Small focus of increased metabolic activity within a fibroid in the
central portion with a maximum SUV of 6.7 on image 175 of series 4.
No visible CT correlate within the fibroid. This area measures
approximately 1-1.5 cm within a 5 x 4.9 cm leiomyoma.

SKELETON: Innumerable foci of increased metabolic activity
throughout the axial and appendicular skeleton involving all
visualized bones. And every spinal level with variable degrees of
spinal involvement, some levels showing nearly the entire vertebral
body with hypermetabolic features, other levels with posterior
element involvement or mix of vertebral body and posterior element
involvement.

This also likely includes the skull base at the atlantoaxial joint

Numerous lesions in the bilateral proximal femora with near complete
replacement of the LEFT and RIGHT femoral neck with metabolic
activity LEFT greater than RIGHT.

(Image 76, series 4) stone with metabolic activity approximately
13.1 SUV. Sternal manubrium on image 57 with a maximum SUV of 12.3.
C7-T1 with a maximum SUV of approximately 10.1.

LEFT proximal femur with a maximum SUV of 11.4. this area is
associated with more pronounced periosteal activity and cortical
irregularity seen along the anterior surface of the femur, femoral
neck on image 182 of series 4.

Most of the lesions are nearly radiographically occult with subtle
foci of increased density within the marrow space of the RIGHT and
LEFT proximal femur and RIGHT and LEFT proximal humerus.

Heterogeneity at the T3 level is radiographically apparent as well,
subtle variable density within the ribs.

Numerous foci of subcutaneous nodularity within the subcutaneous
fat, example of this process seen along the RIGHT flank and
previously described subcutaneous nodules in the neck. RIGHT flank
lesion on image 97 of series 4 measuring 17 mm with a maximum SUV of
6.9

Small intramuscular lesion on image 121 of series 4 within the RIGHT
erector spinae muscle less than a cm with scattered foci either
within the subcutaneous fat or body wall musculature scattered
throughout, most lesions appear to be within the subcutaneous fat.

Incidental CT findings: none
IMPRESSION: 1. Widespread metastatic disease to the chest, abdomen, and pelvis.
Metastatic disease from breast cancer is considered though with
pancreatic mass showing peripheral pancreatic atrophy widespread
disease from pancreatic adenocarcinoma is another differential
consideration.
2. Chest involvement and neck involvement with lymph nodes and
pulmonary nodules.
3. Solid organ involvement in the abdomen includes the liver,
pancreas and bilateral adrenal glands with signs of diffuse
peritoneal nodularity and retroperitoneal nodularity as well as
retroperitoneal nodal involvement.
4. Musculoskeletal involvement includes widespread bony metastatic
disease involving all visualized bones and potentially the
atlantooccipital joint in addition to the entire spine. Most of
these areas are nearly radiographically occult with some
irregularity of the LEFT proximal femur along the anterior margin
potentially related to underlying degenerative changes about the hip
though given the degree of involvement suggested on PET the patient
is at risk for pathologic fracture.
5. Heterogeneity of the visualized brain on PET imaging is
nonspecific but suspicious given other findings. Consider dedicated
imaging of the brain with MRI with and without contrast.
6. Numerous subcutaneous nodules. The largest of which along the
RIGHT flank could be amenable to tissue sampling utilizing
ultrasound to establish diagnosis.
7. Nonspecific areas of uptake within the RIGHT parotid and within a
leiomyoma in the uterus, these areas may represent metastatic
disease, more likely given the diffuse nature of disease seen on
today's scan.
8. Staghorn calculus in the RIGHT kidney
9. Aortic atherosclerosis.
10. Signs of LEFT mastectomy and axillary dissection.

These results will be called to the ordering clinician or
representative by the Radiologist Assistant, and communication
documented in the PACS or [REDACTED].

Aortic Atherosclerosis ([id number]).
